# Patient Record
Sex: Female | Born: 1960 | Race: Black or African American | Hispanic: No | Marital: Single | Smoking: Former smoker
Health system: Southern US, Community
[De-identification: ages and names within clinical notes are randomized; demographics above are authoritative.]

## PROBLEM LIST (undated history)

## (undated) DIAGNOSIS — R51 Headache: Secondary | ICD-10-CM

## (undated) DIAGNOSIS — I350 Nonrheumatic aortic (valve) stenosis: Secondary | ICD-10-CM

## (undated) DIAGNOSIS — I5032 Chronic diastolic (congestive) heart failure: Secondary | ICD-10-CM

## (undated) DIAGNOSIS — N938 Other specified abnormal uterine and vaginal bleeding: Secondary | ICD-10-CM

## (undated) DIAGNOSIS — Z992 Dependence on renal dialysis: Secondary | ICD-10-CM

## (undated) DIAGNOSIS — IMO0001 Reserved for inherently not codable concepts without codable children: Secondary | ICD-10-CM

## (undated) DIAGNOSIS — M329 Systemic lupus erythematosus, unspecified: Secondary | ICD-10-CM

## (undated) DIAGNOSIS — Z8719 Personal history of other diseases of the digestive system: Secondary | ICD-10-CM

## (undated) DIAGNOSIS — I050 Rheumatic mitral stenosis: Secondary | ICD-10-CM

## (undated) DIAGNOSIS — I10 Essential (primary) hypertension: Secondary | ICD-10-CM

## (undated) DIAGNOSIS — Z5189 Encounter for other specified aftercare: Secondary | ICD-10-CM

## (undated) DIAGNOSIS — I639 Cerebral infarction, unspecified: Secondary | ICD-10-CM

## (undated) DIAGNOSIS — Z9289 Personal history of other medical treatment: Secondary | ICD-10-CM

## (undated) DIAGNOSIS — Z9889 Other specified postprocedural states: Secondary | ICD-10-CM

## (undated) DIAGNOSIS — IMO0002 Reserved for concepts with insufficient information to code with codable children: Secondary | ICD-10-CM

## (undated) DIAGNOSIS — N186 End stage renal disease: Secondary | ICD-10-CM

## (undated) DIAGNOSIS — Z954 Presence of other heart-valve replacement: Secondary | ICD-10-CM

## (undated) DIAGNOSIS — I38 Endocarditis, valve unspecified: Secondary | ICD-10-CM

## (undated) DIAGNOSIS — M1990 Unspecified osteoarthritis, unspecified site: Secondary | ICD-10-CM

## (undated) DIAGNOSIS — J189 Pneumonia, unspecified organism: Secondary | ICD-10-CM

## (undated) DIAGNOSIS — R112 Nausea with vomiting, unspecified: Secondary | ICD-10-CM

## (undated) DIAGNOSIS — R519 Headache, unspecified: Secondary | ICD-10-CM

## (undated) DIAGNOSIS — F40240 Claustrophobia: Secondary | ICD-10-CM

## (undated) DIAGNOSIS — K219 Gastro-esophageal reflux disease without esophagitis: Secondary | ICD-10-CM

## (undated) DIAGNOSIS — R011 Cardiac murmur, unspecified: Secondary | ICD-10-CM

## (undated) DIAGNOSIS — D649 Anemia, unspecified: Secondary | ICD-10-CM

## (undated) DIAGNOSIS — K279 Peptic ulcer, site unspecified, unspecified as acute or chronic, without hemorrhage or perforation: Secondary | ICD-10-CM

## (undated) HISTORY — DX: Rheumatic mitral stenosis: I05.0

## (undated) HISTORY — PX: ENDOMETRIAL ABLATION: SHX621

## (undated) HISTORY — DX: Chronic diastolic (congestive) heart failure: I50.32

## (undated) HISTORY — DX: Nonrheumatic aortic (valve) stenosis: I35.0

## (undated) HISTORY — PX: COLONOSCOPY W/ BIOPSIES AND POLYPECTOMY: SHX1376

## (undated) HISTORY — DX: Personal history of other medical treatment: Z92.89

## (undated) HISTORY — PX: TUBAL LIGATION: SHX77

## (undated) HISTORY — DX: Peptic ulcer, site unspecified, unspecified as acute or chronic, without hemorrhage or perforation: K27.9

## (undated) HISTORY — DX: Anemia, unspecified: D64.9

---
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
## 1998-02-04 ENCOUNTER — Emergency Department (HOSPITAL_COMMUNITY): Admission: EM | Admit: 1998-02-04 | Discharge: 1998-02-04 | Payer: Self-pay | Admitting: *Deleted

## 2001-12-09 ENCOUNTER — Other Ambulatory Visit: Admission: RE | Admit: 2001-12-09 | Discharge: 2001-12-09 | Payer: Self-pay | Admitting: Family Medicine

## 2002-01-14 ENCOUNTER — Encounter: Payer: Self-pay | Admitting: Obstetrics

## 2002-01-14 ENCOUNTER — Ambulatory Visit (HOSPITAL_COMMUNITY): Admission: RE | Admit: 2002-01-14 | Discharge: 2002-01-14 | Payer: Self-pay | Admitting: Obstetrics

## 2003-05-01 ENCOUNTER — Emergency Department (HOSPITAL_COMMUNITY): Admission: EM | Admit: 2003-05-01 | Discharge: 2003-05-01 | Payer: Self-pay | Admitting: Emergency Medicine

## 2003-05-02 ENCOUNTER — Emergency Department (HOSPITAL_COMMUNITY): Admission: EM | Admit: 2003-05-02 | Discharge: 2003-05-02 | Payer: Self-pay | Admitting: Emergency Medicine

## 2003-07-01 ENCOUNTER — Encounter: Admission: RE | Admit: 2003-07-01 | Discharge: 2003-07-01 | Payer: Self-pay | Admitting: Cardiology

## 2004-10-19 ENCOUNTER — Encounter: Admission: RE | Admit: 2004-10-19 | Discharge: 2004-10-19 | Payer: Self-pay | Admitting: Cardiology

## 2005-04-27 ENCOUNTER — Emergency Department (HOSPITAL_COMMUNITY): Admission: EM | Admit: 2005-04-27 | Discharge: 2005-04-27 | Payer: Self-pay | Admitting: Emergency Medicine

## 2005-04-30 ENCOUNTER — Inpatient Hospital Stay (HOSPITAL_COMMUNITY): Admission: EM | Admit: 2005-04-30 | Discharge: 2005-05-11 | Payer: Self-pay | Admitting: Emergency Medicine

## 2005-05-08 ENCOUNTER — Encounter: Payer: Self-pay | Admitting: Nephrology

## 2005-05-08 ENCOUNTER — Encounter (INDEPENDENT_AMBULATORY_CARE_PROVIDER_SITE_OTHER): Payer: Self-pay | Admitting: Specialist

## 2005-05-18 ENCOUNTER — Ambulatory Visit (HOSPITAL_COMMUNITY): Admission: RE | Admit: 2005-05-18 | Discharge: 2005-05-18 | Payer: Self-pay | Admitting: Nephrology

## 2005-05-21 HISTORY — PX: DIALYSIS FISTULA CREATION: SHX611

## 2005-05-31 ENCOUNTER — Emergency Department (HOSPITAL_COMMUNITY): Admission: EM | Admit: 2005-05-31 | Discharge: 2005-05-31 | Payer: Self-pay | Admitting: Emergency Medicine

## 2005-05-31 ENCOUNTER — Ambulatory Visit (HOSPITAL_COMMUNITY): Admission: RE | Admit: 2005-05-31 | Discharge: 2005-05-31 | Payer: Self-pay | Admitting: Nephrology

## 2005-06-01 ENCOUNTER — Emergency Department (HOSPITAL_COMMUNITY): Admission: EM | Admit: 2005-06-01 | Discharge: 2005-06-02 | Payer: Self-pay | Admitting: Emergency Medicine

## 2005-07-03 ENCOUNTER — Ambulatory Visit (HOSPITAL_COMMUNITY): Admission: RE | Admit: 2005-07-03 | Discharge: 2005-07-03 | Payer: Self-pay | Admitting: Vascular Surgery

## 2005-07-05 ENCOUNTER — Ambulatory Visit (HOSPITAL_COMMUNITY): Admission: RE | Admit: 2005-07-05 | Discharge: 2005-07-05 | Payer: Self-pay | Admitting: Nephrology

## 2005-08-19 ENCOUNTER — Inpatient Hospital Stay (HOSPITAL_COMMUNITY): Admission: EM | Admit: 2005-08-19 | Discharge: 2005-08-21 | Payer: Self-pay | Admitting: Emergency Medicine

## 2005-09-18 ENCOUNTER — Ambulatory Visit (HOSPITAL_COMMUNITY): Admission: RE | Admit: 2005-09-18 | Discharge: 2005-09-18 | Payer: Self-pay | Admitting: Nephrology

## 2005-11-15 ENCOUNTER — Encounter: Admission: RE | Admit: 2005-11-15 | Discharge: 2005-11-15 | Payer: Self-pay | Admitting: Nephrology

## 2005-11-21 ENCOUNTER — Emergency Department (HOSPITAL_COMMUNITY): Admission: EM | Admit: 2005-11-21 | Discharge: 2005-11-22 | Payer: Self-pay | Admitting: Emergency Medicine

## 2006-01-30 ENCOUNTER — Encounter: Admission: RE | Admit: 2006-01-30 | Discharge: 2006-01-30 | Payer: Self-pay | Admitting: Surgery

## 2006-01-31 ENCOUNTER — Encounter (INDEPENDENT_AMBULATORY_CARE_PROVIDER_SITE_OTHER): Payer: Self-pay | Admitting: Specialist

## 2006-01-31 ENCOUNTER — Ambulatory Visit (HOSPITAL_BASED_OUTPATIENT_CLINIC_OR_DEPARTMENT_OTHER): Admission: RE | Admit: 2006-01-31 | Discharge: 2006-01-31 | Payer: Self-pay | Admitting: Surgery

## 2006-04-30 ENCOUNTER — Inpatient Hospital Stay (HOSPITAL_COMMUNITY): Admission: EM | Admit: 2006-04-30 | Discharge: 2006-05-02 | Payer: Self-pay | Admitting: Emergency Medicine

## 2007-05-06 ENCOUNTER — Ambulatory Visit (HOSPITAL_COMMUNITY): Admission: RE | Admit: 2007-05-06 | Discharge: 2007-05-06 | Payer: Self-pay | Admitting: Nephrology

## 2007-06-12 ENCOUNTER — Ambulatory Visit (HOSPITAL_COMMUNITY): Admission: RE | Admit: 2007-06-12 | Discharge: 2007-06-12 | Payer: Self-pay | Admitting: Obstetrics

## 2008-01-20 ENCOUNTER — Ambulatory Visit: Payer: Self-pay | Admitting: Vascular Surgery

## 2008-03-23 ENCOUNTER — Encounter: Admission: RE | Admit: 2008-03-23 | Discharge: 2008-03-23 | Payer: Self-pay | Admitting: Obstetrics

## 2008-09-30 ENCOUNTER — Encounter: Admission: RE | Admit: 2008-09-30 | Discharge: 2008-09-30 | Payer: Self-pay | Admitting: Obstetrics

## 2009-03-15 ENCOUNTER — Ambulatory Visit (HOSPITAL_COMMUNITY): Admission: RE | Admit: 2009-03-15 | Discharge: 2009-03-15 | Payer: Self-pay | Admitting: Nephrology

## 2010-06-10 ENCOUNTER — Encounter: Payer: Self-pay | Admitting: Nephrology

## 2010-06-11 ENCOUNTER — Encounter: Payer: Self-pay | Admitting: Obstetrics

## 2010-06-12 ENCOUNTER — Encounter: Payer: Self-pay | Admitting: Obstetrics

## 2010-06-13 ENCOUNTER — Other Ambulatory Visit (HOSPITAL_COMMUNITY): Payer: Self-pay | Admitting: Nephrology

## 2010-06-13 DIAGNOSIS — N186 End stage renal disease: Secondary | ICD-10-CM

## 2010-07-04 ENCOUNTER — Other Ambulatory Visit (HOSPITAL_COMMUNITY): Payer: Self-pay

## 2010-10-03 NOTE — Assessment & Plan Note (Signed)
OFFICE VISIT   [name], [name]  DOB:  06-29-1960                                       01/20/2008  [id number]   This is an office visit.  The patient has a right upper arm  brachiocephalic AV fistula created by Dr. Amedeo Plenty 2 years ago.  The  fistula is functioning nicely but has enlarged over the last several  months and has several dilated areas.  She is not having pain and has  had no history of bleeding or ulceration from this.   PHYSICAL EXAMINATION:  On exam today blood pressure is 126/85, heart  rate 76, respirations 14.  She has a well-developed Kauffman fistula in  the right upper arm.  The largest area of dilatation is in the lower  third of the upper arm and this measures 4 cm in diameter with another  area up to 3 cm in diameter.  There are no ulcerated areas and no  evidence of any skin breakdown.  She has no steal symptoms distally.   I do not think the fistula is large enough at this point to merit  ligating and sacrificing it since we have no other option if we remove  it.  If we did this we would need to place a forearm graft with a Diatek  catheter but I would think she can get some more time out of this  fistula before it enlarges significantly.  If there are any episodes of  ulceration or bleeding then we should reconsider at that time.   Nelda Severe Kellie Simmering, M.D.  Electronically Signed   JDL/MEDQ  D:  01/20/2008  T:  01/21/2008  Job:  [id number]   cc:   [name] L. [name], M.D.

## 2010-10-06 NOTE — Discharge Summary (Signed)
NAMESHAYLEIGH, MERLINI                 ACCOUNT NO.:  [id number]   MEDICAL RECORD NO.:  [id number]          PATIENT TYPE:  INP   LOCATION:  5511                         FACILITY:  [facility]   PHYSICIAN:  Donato Heinz, M.D.DATE OF BIRTH:  07-28-1960   DATE OF ADMISSION:  04/30/2006  DATE OF DISCHARGE:  05/02/2006                               DISCHARGE SUMMARY   DISCHARGE DIAGNOSES:  1. Refractory nausea and vomiting.  2. Positive for Gastroccult.  3. Hypertension.  4. End-stage renal disease.  5. Secondary hyperparathyroidism.  6. Hyperlipidemia.  7. Systemic lupus erythematosus.  8. History of substance abuse.   PROCEDURES:  Hemodialysis.   HISTORY OF PRESENT ILLNESS:  Ms. Gruhlke is a 50 year old African American  female with end-stage renal disease secondary to lupus who dialyzes  Monday, Wednesday, Friday at the [facility]. She was seen  by the physician assistant and her dialysis treatments on the 10th and  given #6 samples of Prevacid 30 mg because she was having gastritis-type  symptoms and had been unable to fill her prescription for approximately  one day because the insurance would not pay for it.  The dialysis center  faxed an order to her pharmacy giving clearance for this and she should  be able to pick it up now.  Nonetheless, her vomiting was too  significant yesterday in order to keep the Prevacid down or her  hypertensive medicines.  She presented to the emergency room this  morning after getting her children off for school and was evaluated by  Dr. Monia Pouch at the emergency room and found to have coffee-ground  emesis.  Patient cannot identify any aggravating factor.  She said she  had only been off her PPIs for approximately one day.  She denied having  any abdominal pain or nausea with the vomiting, just uncontrolled  vomiting.  The patient did admit to drinking one beer this weekend but  said she did not even finish that beer.  She cannot identify  any other  dietary reason that might have initiated this acute onset of nausea and  vomiting.  She has been fairly stable and has a known history of  gastritis followed closely by Dr. Earlean Shawl.  Remainder of physical exam is  outlined in admission history and physical.   HOSPITAL COURSE:  Problem 1:  The patient was placed on clear liquids  and given IV Protonix and antiemetics.  She was still having nausea and  vomiting the following day and a GI consult was provided by Dr. Earlean Shawl.  He suspected that nausea and vomiting was most likely secondary to  advanced GERD and was most likely resolving or on appropriate therapy at  this time.  She will continue on omeprazole 40 mg b.i.d. as well as  Reglan to help these symptoms.  By the 13th, she was eating well and  tolerating her meals without problems.  Her blood pressure the day of  discharge was 126/78.  Her labetalol had been discontinued.  Her dry  weight is lowered to 70 kg.  Her hemoglobin is still to 15.6.  I  do not  think she needs serial hemoglobins.  She is on low-dose iron and no  Neupogen.  Her drug screen is still pending.  Her alcohol screen was  negative.   DISCHARGE MEDICATIONS:  1. Norvasc 10 mg at bedtime.  2. Simvastatin 40 mg per day.  3. Nephro-Vite 1 tablet per day.  4. PhosLo 667 mg three with meals three times a day.  5. Prilosec 40 mg b.i.d.  6. Reglan 10 mg before meals and bedtime.  7. Maxitrol eye drops as before.  8. She is advised to stop taking labetalol.   DISCHARGE DIET:  ADA, to limit fluids to 12 mL per day.  Her dialysis  orders are the same except her dry weight is now 70 kg.  Her Hectorol  dose is 4 mcg.  She is on tight heparin and InFeD is 50 mg IV per week.      Alric Seton, P.A.    ______________________________  Donato Heinz, M.D.    MB/MEDQ  D:  05/02/2006  T:  05/02/2006  Job:  [id number]   cc:   Donato Heinz, M.D.  Mayme Genta, M.D.  [facility]

## 2010-10-06 NOTE — H&P (Signed)
NAMETYRIONNA, AANENSON                 ACCOUNT NO.:  [id number]   MEDICAL RECORD NO.:  [id number]          PATIENT TYPE:  INP   LOCATION:  5511                         FACILITY:  [facility]   PHYSICIAN:  Donato Heinz, M.D.DATE OF BIRTH:  19-Jan-1961   DATE OF ADMISSION:  04/30/2006  DATE OF DISCHARGE:                              HISTORY & PHYSICAL   HISTORY OF PRESENT ILLNESS:  Ms. Reinheimer is a 50 year old African-American  female with end-stage renal disease secondary to lupus who dialyzes  Monday, Wednesday, Friday at the [facility].  She was seen  yesterday by the physician assistant at her dialysis treatment and was  given #6 samples of Prevacid 30 mg because she was having gastritis-type  symptoms and had been unable to fill her prescription for approximately  1 day because the insurance would not pay for it.  The dialysis center  faxed an order to her pharmacy giving clearance for this and she should  be able to pick it up now.  Nonetheless, her vomiting was too  significant yesterday in order to keep down the Prevacid and her  hypertensive medicines.  She presented to the emergency room this  morning after getting her children off for school and was evaluated by  Dr. Monia Pouch in the emergency room and found to have coffee-ground  emesis.  Patient cannot identify any aggravating factors.  She said she  had only been off her PPI for 1 day.  She is not having any abdominal  pain or nausea with the vomiting, just uncontrolled vomiting.  Patient  did admit to drinking 1 beer this weekend but said that she did not even  finish that beer.  She cannot identify any other dietary reasons that  would have initiated this acute onset of nausea and vomiting.  She had  been fairly stable and has a known history of gastritis followed by Dr.  Earlean Shawl.   PAST MEDICAL HISTORY:  1. Hypertension.  2. Anemia.  3. Dyslipidemia.  4. Peptic ulcer disease.  5. Hiatal hernia.  6. [facility]  admission, April, 2007, for subarachnoid bleed, fell at      home, had positive cocaine and marijuana drug screen at that time,      severe hypertension due to not taking blood pressure medications.   PRIMARY CARE PHYSICIAN:  Dr. Montez Morita.   ALLERGIES:  She has NO KNOWN DRUG ALLERGIES.   CURRENT MEDICATIONS:  1. Omeprazole 40 mg b.i.d. x7 days and then q.h.s. was started      December 10.  2. Phos-Lo 667 mg, 3 with meals 3x a day.  3. Labetalol 300 mg b.i.d.  4. Maxitrol eye drops, 2 drops daily.  5. Phenergan as ordered.  6. Renovite 1 daily.  7. Simvastatin 40 mg per day.  8. Norvasc 10 mg at bedtime.   SOCIAL HISTORY:  According to ER note, she told the ER physician that  she had no drug abuse history, although this is known by old records.  Currently smokes cigarettes, less than 1 pack per day, and drinks  alcohol occasionally.  Patient has  2 children who are elementary and  middle-school age.  She lives with them.   FAMILY HISTORY:  Mother alive at age 60 with hypertension.  Father died,  had a history of hypertension and was on dialysis in [place].   REVIEW OF SYSTEMS:  No fever, no chills, no sweats, no weight gain.  Appetite is good.  No fatigue, no headaches, no dizziness, no visual  changes.  No new skin lesions.  No shortness of breath, chest pain, or  dyspnea on exertion.  No cough, no edema.  No heat or cold intolerance.  Patient is currently having her period.  Has no frequency or urgency  with urination.  She has not seen any blood in her stool.  She has no  diarrhea or constipation or change in bowel habits.   PHYSICAL EXAM:  VITAL SIGNS:  Temperature 97.1, pulse 115, blood  pressure 165/117, respiratory rate 18, pulse ox 99% on room air.  GENERAL:  Alert and oriented, in no acute distress.  NECK:  No JVD, no lymphadenopathy.  HEART:  Tachycardiac at a rate of 115.  LUNGS:  Clear to auscultation bilaterally.  ABDOMEN:  Positive bowel sounds, soft,  nontender, nondistended.  EXTREMITIES:  No cyanosis, no clubbing, no edema.  Access:  Right upper  AV fistula with positive bruit.  GENITOURINARY/RECTAL:  Not examined.  NEURO:  Alert and oriented x3.  Cranial nerves II through XII intact.  Motor intact in all extremities.   ASSESSMENT AND PLAN:  1. Refractory nausea and vomiting with coffee-ground emesis and a      known history of peptic ulcer disease and gastritis.  Patient has      been off of her proton pump inhibitors for several days.  She was      given intravenous Zofran and intravenous proton pump inhibitor in      the emergency room and presently has stopped vomiting.  Her emesis      has been sent for Gastroccult.  She is admitted for supportive      treatment and advancement of diet as tolerated.  I believe this      will be short-lived and she will be able to be discharged tomorrow      after dialysis.  2. Uncontrolled hypertension secondary to nausea and vomiting and      unable to keep down her medications.  She has received some      intravenous labetalol in the emergency room and her blood pressure      is improving.  We will continue intravenous labetalol as needed and      resume oral medicines as able.  She has had well-controlled blood      pressure in the outpatient setting.  3. Coffee-ground emesis.  She is on a proton pump inhibitor.  We will      follow hemoglobin.  Her hemoglobin is actually 17 now, which is      probably elevated due to volume contraction.  4. Systemic lupus erythematosus.  This is stable.  5. End-stage renal disease.  Continue dialysis schedule Monday,      Wednesday, Friday.  Hopefully she will be able to discharge after      dialysis tomorrow.      Alric Seton, P.A.    ______________________________  Donato Heinz, M.D.    MB/MEDQ  D:  04/30/2006  T:  05/01/2006  Job:  [id number]

## 2010-10-06 NOTE — Op Note (Signed)
[name], [name]                 ACCOUNT NO.:  [id number]   MEDICAL RECORD NO.:  [id number]          PATIENT TYPE:  AMB   LOCATION:  SDS                          FACILITY:  [facility]   PHYSICIAN:  Dorothea Glassman, M.D.    DATE OF BIRTH:  03/17/1961   DATE OF PROCEDURE:  07/03/2005  DATE OF DISCHARGE:  07/03/2005                                 OPERATIVE REPORT   SURGEON:  Dorothea Glassman, M.D.   ASSISTANT:  Suzzanne Cloud, P.A.   ANESTHETIC:  Local with MAC.   PREOPERATIVE DIAGNOSIS:  End-stage renal failure.   POSTOPERATIVE DIAGNOSIS:  End-stage renal failure.   PROCEDURE:  Right brachial cephalic arteriovenous fistula.   OPERATIVE PROCEDURE:  The patient was brought to the operating room in  stable condition; pin supine position. Right arm prepped and draped in a  sterile fashion. Skin and subcutaneous tissues were instilled with 1%  Xylocaine with epinephrine. Transverse skin incision made through the right  antecubital fossa. Dissection carried through the subcutaneous tissue. The  right cephalic vein identified. This was mobilized proximally and distally.  Tributaries were ligated with 4-0 silk and divided. The vein ligated  distally with 2-0 silk and divided. Dilated with heparin saline solution.  The vein was 4-5 mm in size.   The brachial artery was exposed through the fascia. Dissection carried down  to free the brachial artery which was encircled proximally and distally with  vessel loops. The brachial artery controlled proximally and distally with  bulldog clamps. A longitudinal arteriotomy made. The vein beveled and  anastomosed end-to-side to the brachial artery using running 7-0 Prolene  suture. Clamps were removed. Excellent flow present. Adequate hemostasis  obtained.  Sponge and instruments counts correct.   Subcutaneous tissue closed with a running 3-0 Vicryl suture. Skin closed  with a 4-0 Monocryl. Steri-Strips applied. No apparent complications. The  patient  transferred to recovery room in stable condition.      Dorothea Glassman, M.D.  Electronically Signed     PGH/MEDQ  D:  07/03/2005  T:  07/03/2005  Job:  [id number]

## 2010-10-06 NOTE — Discharge Summary (Signed)
[name], [name]                 ACCOUNT NO.:  [id number]   MEDICAL RECORD NO.:  [id number]          PATIENT TYPE:  INP   LOCATION:  5501                         FACILITY:  [facility]   PHYSICIAN:  Leamon Arnt(?), M.D.DATE OF BIRTH:  08/03/1960   DATE OF ADMISSION:  08/19/2005  DATE OF DISCHARGE:  08/21/2005                                 DISCHARGE SUMMARY   DISCHARGE DIAGNOSES:  1.  Fall.  2.  Traumatic brain injury with questionable subarachnoid hemorrhage.  3.  End-stage renal disease on dialysis.  4.  Hypertension.  5.  Hyperlipidemia.  6.  Peptic ulcer disease.  7.  Inguinal hernia.   CONSULTANTS:  Renal.   PROCEDURES:  None.   HISTORY OF PRESENT ILLNESS:  This is a 50 year old black female who fell in  her bathroom at home.  She is amnestic to the event.  She comes in as a non-  trauma code.  She was slightly confused on arrival.  Workup demonstrated a  questionable left subarachnoid near the tentorial membrane.  There was quite  a bit of motion artifact.  She is admitted for observation and renal  consultation, as she is in end-stage renal disease.   HOSPITAL COURSE:  The patient did well in the hospital.  Her medical issues  were managed by the renal service.  Repeat head CT was unchanged, and she  did not have any apparent clinical sequelae from her fall, with the  exception of the amnesia of the event itself.  She was felt to be able to go  home in good condition on hospital day #3.   DISCHARGE MEDICATIONS:  She is take over-the-counter pain medications, and  she is to resume all her home medications.  Other adjustments made by the  renal service prior to discharge.   FOLLOW UP:  The patient is to follow-up with the renal physicians as usual.  She will call the trauma service with any other questions.      Hilbert Odor, P.A.    ______________________________  Leamon Arnt(?), M.D.    MJ/MEDQ  D:  08/21/2005  T:  08/21/2005  Job:  [id number]

## 2010-10-06 NOTE — Consult Note (Signed)
NAMELOCHLAN, TORNETTA                 ACCOUNT NO.:  [id number]   MEDICAL RECORD NO.:  [id number]          PATIENT TYPE:  INP   LOCATION:  [id number]                         FACILITY:  [facility]   PHYSICIAN:  Mayme Genta, M.D.DATE OF BIRTH:  08/31/1960   DATE OF CONSULTATION:  05/01/2006  DATE OF DISCHARGE:                                 CONSULTATION   CHIEF COMPLAINT:  Nausea and vomiting.   HISTORY OF PRESENT ILLNESS:  The patient is a 50 year old African-  American female admitted with nausea and vomiting.  She was last seen by  me as an outpatient August 2007 for similar symptoms.  Evaluation with  endoscopy revealed erosive esophagitis.  This occurred in the face of  generic omeprazole therapy, and she was prescribed high-dose omeprazole  40 mg b.i.d. with which symptoms resolved.   The patient was admitted to the hospital yesterday after she presented  to the emergency room with recurrent symptoms of refractory nausea and  vomiting.  Her omeprazole had ran out four days previously.  She did  note worsening pyrosis and regurgitation prior to the onset of her  symptoms.  She has markedly improved within 24 hours of admission.  Additional history of coffee-ground hematemesis and Hemoccult-positive  gastric aspirate.   The patient presently is asymptomatic.  Started on PPI therapy since  admission with Protonix 40 mg daily and Reglan 10 mg a.c. and q.h.s.   The patient denies odynophagia and dysphagia.  No hematemesis of frank  blood, no abdominal pain, denies dyspepsia.  Bowel habits regular.  No  hematochezia or melena.  The patient remained cardiovascularly stable  since admission and appetite has returned to her excellent baseline  status.   PAST MEDICAL HISTORY:  1. Hypertension.  2. Anemia.  3. Dyslipidemia.  4. S/P subarachnoid bleed April 2007.   ALLERGIES:  No known drug allergies.   CURRENT MEDICAL REGIMEN:  1. Omeprazole 40 mg b.i.d.  2. PhosLo 667 mg 3 with meals   t.i.d.  3. Labetalol 300 mg b.i.d.  4. Maxitrol eye drops daily.  5. Renavit 1 daily.  6. Simvastatin 40 mg daily.  7. Norvasc 10 mg daily.   SOCIAL HISTORY:  Three times a week hemodialysis.  Actively smoking  albeit less one pack per day.  Occasional alcohol including recent beer,  denies abuse or binge drinking.  Two children living at home.  The  patient denies drug abuse though admission earlier this year was  positive for cocaine and marijuana.   FAMILY HISTORY:  Mother with hypertension.  Father decreased from  hypertension with end-stage renal disease.   REVIEW OF SYSTEMS:  Noncontributory - per HPI.   PHYSICAL EXAMINATION:  A healthy, alert and oriented middle-aged [place]-  American female alert and oriented, in no acute distress.  VITAL SIGNS:  Stable.  HEENT:  Anicteric sclerae.  Pink conjunctivae.  Moist mucous membranes.  No oropharyngeal lesions.  No exudate.  NECK:  Supple, no adenopathy, thyromegaly, or bruit.  CHEST:  Clear to auscultation.  CARDIAC:  Regular rhythm, no gallops, no murmur.  ABDOMEN:  Now soft,  nondistended, nontender.  No fluid wave, no  organomegaly, firmness, or mass.  Bowel sounds were active throughout  but not performed.  EXTREMITIES:  Right antecubital AV fistula with a positive thrill.  RECTAL:  No performed.  NEUROLOGIC:  There is no focal deficit.   LABORATORY DATA:  Significant abnormalities include initial  hemoconcentration but with normal hemoglobin at 15.5, hematocrit 45.5  with 24 hours of rehydration, WBC 3.4K, platelets 203K.  Slight elevated  BUN 35, glucose 111, AST 38 (normal 37), normal lipase of 35.  Undetectable alcohol level, and positive gastric fluid.   ASSESSMENT:  1. Nausea, vomiting - precipitated by gastric esophageal acid reflux      disease exacerbated by PPI therapy temporarily unavailable.  Over      the ensuing four days with probable worsening reflux resulting in      hospitalization.  No evidence of  significant gastrointestinal blood      loss.  Hematemesis of coffee-ground material minimal without      evidence of hemodynamic compromise.  The patient is rapidly      improving.  There is no need for further evaluation at this time.      I would simply encourage the patient to adhere to chronic acid      suppression with maintenance high-dose generic omeprazole 40 mg      t.i.d.  If there is a way to get branded PPI therapy for this      patient, I think she will do better in the long run given increased      efficacy in her case.  No further evaluation presently recommended.   RECOMMENDATIONS:  1. Continue PPI therapy as outlined above.  2. No need for endoscopic evaluation presently.  3. Antireflux measures reviewed.      Mayme Genta, M.D.  Electronically Signed     JRM/MEDQ  D:  05/01/2006  T:  05/02/2006  Job:  [id number]

## 2010-10-06 NOTE — Op Note (Signed)
[name], [name]                 ACCOUNT NO.:  [id number]   MEDICAL RECORD NO.:  [id number]          PATIENT TYPE:  AMB   LOCATION:  NESC                         FACILITY:  [facility]   PHYSICIAN:  Thomas A. Cornett, M.D.DATE OF BIRTH:  [birth year]   DATE OF PROCEDURE:  01/31/2006  DATE OF DISCHARGE:                                 OPERATIVE REPORT   PREOPERATIVE DIAGNOSES:  3 x 3 cm epidermal inclusion cyst, right shoulder.   POSTOPERATIVE DIAGNOSES:  3 x 3 cm epidermal inclusion cyst, right shoulder.   PROCEDURE:  Excision of 3 cm x 3 cm epidermal inclusion cyst, right  shoulder.   SURGEON:  Marcello Moores A. Cornett, M.D.   ANESTHESIA:  MAC with 20 mL of 0.25% Sensorcaine with epinephrine.   ESTIMATED BLOOD LOSS:  10 mL.   SPECIMENS:  Right shoulder cyst to pathology.   INDICATIONS FOR PROCEDURE:  The patient is a 50 year old female with an  epidermal inclusion cyst. It has been giving her problems with pain and  discomfort. She wished to have it removed and presents today for that.   DESCRIPTION OF PROCEDURE:  The patient was brought to the operating room and  placed prone. MAC anesthesia was initiated and the right shoulder region was  prepped and draped in a sterile fashion. 0.25% Sensorcaine was used and 20  mL of this was used for analgesia. The cyst was excised down into the  subcutaneous fat and passed off the field. The wound was examined and found  to be hemostatic. It was closed with a combination of 2-0 Vicryl and 3-0  Monocryl. Steri-Strips and dry dressing were applied. All final counts of  sponge, needle and instruments were found to be correct at this portion of  the case. The patient was awoke and taken to recovery in satisfactory  condition.      Thomas A. Cornett, M.D.  Electronically Signed     TAC/MEDQ  D:  01/31/2006  T:  02/01/2006  Job:  [id number]   cc:   Dr. Jimmy Footman

## 2010-10-06 NOTE — Consult Note (Signed)
NAMEMARLAYA, [name]                 ACCOUNT NO.:  [id number]   MEDICAL RECORD NO.:  [id number]          PATIENT TYPE:  INP   LOCATION:  0101                         FACILITY:  [facility]   PHYSICIAN:  Caren Griffins B. Lorrene Reid, M.D.DATE OF BIRTH:  07/05/1960   DATE OF CONSULTATION:  04/30/2005  DATE OF DISCHARGE:                                   CONSULTATION   CONSULTING PHYSICIAN:  Dr. Montez Morita.   REASON FOR CONSULTATION:  Renal failure and hypertensive emergency.   We are asked to see this 50 year old black female who has a history of  hypertension of at least 12 years duration which has not been well  controlled by her report despite multiple medication trials. She now  presents with her second emergency department visit in a three-day period  with severe hypertension and labs today showed a creatinine of 7.3 which was  1.3 in June 2006. As a result, we were asked to consult on this lady.   She has been followed by Dr. Montez Morita for problems of hypertension,  hyperlipidemia, peptic ulcer disease and hiatal hernia. Blood pressures over  the past six months by both the patient and Dr. Tonita Phoenix recollections have  been in the 160 to 190 range. She has been on variations of medicines that  have included Lotrel, Hyzaar, Toprol samples, Sular which she could not take  due to headache and Lasix for edema that has occurred over the past several  months. She presented to the emergency room on April 27, 2005 with nausea,  vomiting and a blood pressure of 164/98 and 169/95. She received clonidine  and IV labetalol. No laboratory studies were done.   She returned today with similar complaints of nausea, vomiting, now with  diarrhea and blood pressure initially was 191/139. Laboratory studies were  done on this presentation and serum creatinine was 7.3 with a BUN of 51 and  a potassium of 3.8.   Her past renal history is fairly limited. Serum creatinine in June of 2006  was 1.3. Urinalysis was bland  but did show greater than 300 milligrams  percent protein by dipstick. She had an abdominal ultrasound done for other  reasons (GI complaints) in June 2006 and kidneys at that time measured 12.5  and 12.6 cm respectively without hydronephrosis or increased echogenicity.   Urinary symptoms wise her output has been down for the past few days and she  describes her urine as murky but has not seen any blood. She has been trying  to take her Lotrel and Hyzaar over the past few days but has not taken her  Lasix. She uses nonsteroidal's on an infrequent basis.   PAST MEDICAL HISTORY:  1.  Hypertension of greater than 12 years duration with a very positive      family history.  2.  Chronic kidney disease with proteinuria by dip stick June 2006 with a      creatinine of 1.3 at that time.  3.  Severe hyperlipidemia with a total cholesterol of 372 and LDL      cholesterol of 269 in June 2006.  4.  Peptic ulcer disease.  5.  Hiatal hernia (GI issues have been addressed by Dr. Earlean Shawl).   She has no known drug allergies.   CURRENT MEDICATIONS:  On presentation were Lipitor 20 milligrams a day,  Lotrel 10/20 once a day, Hyzaar 100/25 once a day, Protonix 40 milligrams a  day and Lasix 40 milligrams a day.   FAMILY HISTORY:  Positive for hypertension in her parents and grandparents  and also in a 60 year old daughter who has been worked up at [facility] and is on  medications. She has a father who had end-stage renal disease and was on  hemodialysis at a dialysis unit in [place] prior to his death. She is not  sure why he had end-stage renal disease but she is aware that he had  hypertension. His name was [name].   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 She is unmarried but has two daughters, one  age 46 who has hypertension followed at [facility] and the other age 21  who is healthy. She states she smokes 5-10 cigarettes a  day. Drinks  occasional alcohol and has done crack cocaine in the past. She has been  tested for HIV in the past and has been negative and reports no history of  hepatitis.   REVIEW OF SYSTEMS:  Positive for several months of nausea and vomiting.  Anorexia for several weeks. Diarrhea for 4-5 days. Swelling in her lower  extremities for over 6 months. Swollen glands in her face and under her chin  for months. Weight loss over the past several days and murky-looking urine.  She denies chest pain, shortness of breath, headache, blurred vision, double  vision rash, myalgias or arthralgias.   On physical exam, she is a somewhat ill-appearing black female. She has a  Foley catheter in place and there is no urine in the Foley. Blood pressure  193/139, heart rate 100. Funduscopic exam was somewhat difficult due to  myotic pupils but there was arteriolar narrowing present. I could not see  the disk margins. Cardiac exam revealed a prominent S4. Normal S1-S2.  Abdomen was somewhat distended. Bowel sounds were normally active and there  were no bruits, but she had mild diffuse tenderness. Femoral pulses were 2+  and symmetric. There was trace edema of the right lower extremity. No edema  of the left lower extremity. Distal pulses were 2+ and symmetric. Skin was  free of rashes and joints free of effusion.   LABORATORY:  Included hemoglobin 12.12, platelets 320,000. Sodium 134,  potassium 3.8, chloride 105, CO2 20, BUN 51, creatinine 7.3, calcium 7.8.   IMPRESSION:  A 50 year old black female who presents with accelerated  hypertension and renal failure. Creatinine was 1.3 in June 2006 with greater  than 300 milligrams percent protein by dipstick; creatinine is now 7.3. She  is probably a little on the dry side due to nausea, vomiting and diarrhea.   In terms of putting this picture together, it is unclear to me whether this is renal failure all secondary to hypertension plus or minus effects  of  volume depletion and ARB therapy or whether she has some underlying renal  disease perhaps primary FSGS associated with severe hypertension; the  combination of which have resulted in renal failure. Her nausea and vomiting  could certainly be centrally related due to blood pressure or may be  secondary to uremia. Her hemoglobin is not as low as I  might expect given  her degree of renal failure but I do believe she is dry and may be  hemoconcentrated.   RECOMMENDATIONS:  1.  Control hypertension with a goal diastolic for right now of 95-100.  2.  Dr. Montez Morita has already started IV nitroglycerin. I would add a      labetalol drip and also supplement with oral clonidine.  3.  Would avoid ACE or ARB therapy for the time being.  4.  We will check a urinalysis, urine protein, creatinine and serologies to      include ANA, SPEP, UPEP, anti-SSA and SSB (given her parotid swelling),      ACE level, C3-C4 hepatitis B, hepatitis C, HIV and CPK.  5.  Would obtain a renal ultrasound to see if there has been any change in      kidney size or echogenicity over the past 6-8 months.  6.  Would transfer to [facility]. She may well require urgent hemodialysis      and/or may require renal biopsy. If no diagnosis is forthcoming and      kidneys have remained normal in size in 9/9 echogenic, she might be a      candidate for biopsy.  7.  She should probably at some point have a workup for renal artery      stenosis since she does have a fairly impressive hyperlipidemia and also      has a family history with a young child who also has hypertension      (bringing to mind the possibility of fibromuscular dysplasia).  8.  Would continue with slow IV fluids. Although her pressure is quite high      supine, I cannot visualize her neck veins and I think she could well be      a little bit volume depleted despite the severity of her hypertension      secondary to her nausea, vomiting, diarrhea.  9.  We will  follow closely with you. Thanks for asking [place] to see this young      woman. We will follow up when she is transferred to [facility].           ______________________________  Elzie Rings. Lorrene Reid, M.D.     CBD/MEDQ  D:  04/30/2005  T:  04/30/2005  Job:  [id number]

## 2010-10-06 NOTE — Consult Note (Signed)
[name], [name]                 ACCOUNT NO.:  [id number]   MEDICAL RECORD NO.:  [id number]          PATIENT TYPE:  INP   LOCATION:  5501                         FACILITY:  [facility]   PHYSICIAN:  Caren Griffins B. Lorrene Reid, M.D.DATE OF BIRTH:  [birth year]   DATE OF CONSULTATION:  DATE OF DISCHARGE:  08/21/2005                                   CONSULTATION   REFERRED BY:  Imogene Burn. Tsuei, M.D.   This is a 50 year old black woman with a past medical history significant  for end-stage renal disease on hemodialysis twice a week. 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 She is dialyzed  at [place] who comes to the emergency room after  she fell in her bathroom, lost consciousness and she was transferred through  EMS. In the ER, the trauma surgeon found subarachnoid hemorrhage/hemorrhagic  contusion of the left posteromedial temporal lobe. She was admitted by the  trauma service and was evaluated by a neurosurgeon already and we are asked  to take her for hemodialysis.   PAST MEDICAL HISTORY:  1.  Hypertension of more than 12 years duration. We have a very positive      family history.  2.  Severe hyperlipidemia.  3.  History of peptic ulcer disease.  4.  Hiatal hernia.   ALLERGIES:  She has no known drug allergies.   CURRENT MEDICATIONS:  1.  Phenergan suppositories 25 mg q.6 p.r.n.  2.  Clonidine 0.3 mg p.o. t.i.d.  3.  Labetalol 300 mg 1 t.i.d. on nondialysis days and 1 tablet b.i.d. on      dialysis days.  4.  Protonix 40 mg daily.  5.  Septra DS 1 p.o. daily x7 days.  6.  Prednisone 20 mg three tablets daily.  7.  Simvastatin 40 mg p.o. daily.  8.  Norvasc 10 mg p.o. daily.  9.  Lasix 80 mg p.o. daily.   Of note, the patient said that she ran out between 2-3 days ago on  Clonidine, Norvasc and Lasix.   FAMILY HISTORY:  Positive for hypertension in her parents and grandparents  and also in a 22 year old daughter who  has been worked up at [facility] and is on  medication. Her father had end-stage renal disease and was on hemodialysis  at dialysis unit in [place] prior to his death. She does not know the  cause of the renal disease.   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 She is married and has 2 daughters, one at the  age of 44 and the other one at the age of 80 who is healthy. She smokes  approximately 10 cigarettes a day, drinks occasional alcohol.   REVIEW OF SYSTEMS:  Positive for nausea and vomiting for the last 2-3 days  after dialysis on Friday.   PHYSICAL EXAMINATION:  GENERAL:  The patient is lying down in no acute  distress, very cooperative.  VITAL SIGNS:  Her blood pressure by EMS was 194/137, at the emergency room  was  190/130, temperature 98, pulse 99, respiratory rate 18 and oxygen  saturation 97% on room air.  HEENT:  PERRLA.  Mucosa is moist with no intraoral lesions.  NECK:  Supple, no JVD.  CHEST:  She has a right subclavicular catheter of dialysis with a dressing.  HEART:  Regular rate and rhythm with no murmurs.  LUNGS:  Clear to auscultation bilaterally.  ABDOMEN:  Soft, nontender, bowel sounds are positive.  EXTREMITIES:  No edema with pedal pulses 2+. On the right arm, she has a  fistula with positive bruit that has not been used yet.  NEUROLOGIC:  No sign of focality but she is oriented x1 to place.   ASSESSMENT/PLAN:  1.  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 On hemodialysis biweekly      according to the patient. The plan is to get records from the Dialysis      Center as soon as possible as todaywe were not able to do andwe are      going to do it tomorrow. She is going to be dialyzed tomorrow, Monday      and with a duration of 4 hours, blood flow of 400  mL an hour with      dialysis of 800 mL an hour. A weight to 67 kilos and withtwo potassium      __________  and no heparin. No heparin at the initiation of dialysis,      completely withdraw all heparin and do not overfill with heparin at the      end. Also because the patient was put in n.p.o. by trauma surgeon and      neurosurgeon, we will switch her p.o. medications to IV so she will be      treated with labetalol 20 mg IV now and then q.3 h and also Catapres      patch 3 q. week and Solu-Medrol 24 mg IV daily. As soon as the patient      can takep.o. she is going to be restarted on her regular medications and      also according to the records that we will obtain. Also we will check a      urinalysis today and a renal panel now and tomorrow in the morning a CBC      and renal panel.  2.  Hypertension. She will be put on Catapres patch.  3.  Q. week labetalol IV.  4.  Left post medial subarachnoid hemorrhage. This will be followed by      primary team.  5.  History of peptic ulcer disease and hiatal hernia. We will treat her      with Protonix IV 40 mg daily and also we will give her Phenergan 12.5 mg      IV q.6 p.r.n. Also we will decrease her normal setting to 50 mL an hour.      The patient was seen and discussed with Dr. Lorrene Reid.      Yong Channel, MD    ______________________________  Elzie Rings. Lorrene Reid, M.D.    Darlin Coco  D:  08/19/2005  T:  08/21/2005  Job:  [id number]

## 2010-10-06 NOTE — Discharge Summary (Signed)
NAMEDEJONAE, STUEDEMANN                 ACCOUNT NO.:  [id number]   MEDICAL RECORD NO.:  [id number]          PATIENT TYPE:  INP   LOCATION:  2014                         FACILITY:  [place]   PHYSICIAN:  Ardyth Gal. Spruill, M.D.DATE OF BIRTH:  February 21, 1961   DATE OF ADMISSION:  04/30/2005  DATE OF DISCHARGE:  05/11/2005                                 DISCHARGE SUMMARY   DISCHARGE DIAGNOSES:  1.  Systemic lupus erythematosus.  2.  Acute renal failure.  3.  Volume depletion.  4.  Hypertensive renal disease.  5.  Hypo-osmolality.  6.  Mallory-Weiss syndrome.   CONSULTATIONS:  1.  Mayme Genta, M.D., small-bowel endoscopy.  2.  Doristine Church. Dover, M.D., needle biopsy of the kidney, May 08, 2005.  [place] Florene Glen, M.D., renal.   Ms. Sallee is a 50 year old patient who was seen initially in the emergency  department of the [facility], at which time she was  found to have acute renal failure with creatinine of 7.3.  She had vomiting,  she was noted to have anemia, which had gone from a hemoglobin of 12 to a  hemoglobin of 7 g.  She was subsequently transferred to the [facility] Intensive Care Unit for aggressive care treatment of this  particular problem.   HOSPITAL COURSE:  The patient was admitted initially in the intensive care  unit.  She was seen on renal consultation where it was noted that she had  accelerated hypertension, renal failure with creatinine of 7.3 as noted  above.  Plans were made to evaluate if she had renal failure because of her  hypertension or renal failure because of underlying renal disease.  Labetalol drip was added to the nitroglycerin that was started as well as  p.o. clonidine.   The patient was noted to have some GI bleeding with heme-positive stools.  The patient was typed and cross-matched for packed cells.  Plans were then  made for GI consultation.  Patient was noted to have GERD and grade B  ulcerative  esophagitis.  Patient was placed on Protonix and antiemetics.   On May 02, 2005, the patient's blood pressure was down to 160/90.  It  is of note that HIV testing and hepatitis B and C were all negative.   On May 03, 2005, there was no vomiting present.  Blood pressure was  still fluctuating.  Plans were made for biopsy of the kidney.  The labetalol  was gradually changed to oral.  The creatinine continued to improve.   On May 08, 2005, the patient underwent left renal biopsy with  ultrasound guidance.   After further evaluation it was the opinion that the patient probably  suffered from systemic lupus erythematosus nephritis with advanced renal  failure.  Plans were made for discharge of this patient as the blood  pressure was under better control, the anemia was under better control.   DISCHARGE MEDICATIONS:  1.  Citric acid twice a day.  2.  Prednisone 60 mg daily.  3.  Labetalol 200 mg every eight  hours.  4.  Catapres 0.3 t.i.d.  5.  Norvasc 10 mg each day.  6.  Protonix 40 mg daily.  7.  Lasix 80 mg twice a day.  8.  Carafate 1 g three times a day.  9.  Calcium 500 mg three times a day.  10. Zocor 40 mg daily.   Patient is to be seen in the renal office for follow-up in one week and in  Dr. Tonita Phoenix office in two weeks.      Lily Kocher, P.A.      Ardyth Gal. Spruill, M.D.  Electronically Signed    HB/MEDQ  D:  07/31/2005  T:  08/01/2005  Job:  [id number]

## 2010-10-06 NOTE — Consult Note (Signed)
[name], [name]                 ACCOUNT NO.:  [id number]   MEDICAL RECORD NO.:  [id number]          PATIENT TYPE:  INP   LOCATION:  5501                         FACILITY:  [place]   PHYSICIAN:  Elizabeth Sauer, M.D.      DATE OF BIRTH:  03/20/1961   DATE OF CONSULTATION:  08/19/2005  DATE OF DISCHARGE:  08/21/2005                                   CONSULTATION   PLACE OF CONSULTATION:  Emergency room.   PHYSICIAN ASKING FOR CONSULT:  Nat Christen, M.D.   I was asked to see this 50 year old, right-handed black female who  apparently fell in the bathroom and had loss of consciousness.  Brought by  her family and EMS to the hospital.  She is a little bit sleepy in the  emergency room.  By history, she is a dialysis patient and to be dialyzed  tomorrow.  CT of the head was obtained and showed a small amount of  subarachnoid blood seen on the tentorium.  She was also visited by trauma  service.   Particulars of H&P are left to the trauma service.   PHYSICAL EXAMINATION:  NEUROLOGIC: She is awake, alert, oriented x3.  Can  recall 3/3 objects at 5 minutes.  Pupils equal, round, and reactive to  light.  Extraocular eye movements are intact.  Facial movement and sensation  are intact.  Tongue protrudes in the midline.  Shoulder shrug is normal, and  she describes some swallowing difficulties.  Motor exam is 5/5 with no  pronator drift. Sensation is intact.   Deep tendon reflexes are absent at the knees and ankles, 1 in the upper  extremities.  Hoffmann's is negative.   CT scan has been reviewed above.   CLINICAL IMPRESSION:  Closed head injury, Glasgow Coma Scale 15.   PLAN:  Observation.  I talked to the renal doctors about holding her heparin  in her dialysis.   Thank you very much.           ______________________________  Elizabeth Sauer, M.D.     MWR/MEDQ  D:  08/19/2005  T:  08/21/2005  Job:  [id number]

## 2010-10-06 NOTE — H&P (Signed)
NAMEBERNETHA, [name]                 ACCOUNT NO.:  [id number]   MEDICAL RECORD NO.:  [id number]          PATIENT TYPE:  INP   LOCATION:  5501                         FACILITY:  [place]   PHYSICIAN:  Imogene Burn. Georgette Dover, M.D. DATE OF BIRTH:  12/06/1960   DATE OF ADMISSION:  08/19/2005  DATE OF DISCHARGE:                                HISTORY & PHYSICAL   CHIEF COMPLAINT:  Fall with loss of consciousness.   HISTORY OF PRESENT ILLNESS:  The patient is a 50 year old renal patient who  had an unwitnessed fall in the bathroom at home.  Her family members heard  her fall and when they arrived at the bathroom, she was unconscious.  The  patient does not remember what happened.  On arrival to the emergency  department, she was mildly confused.   PAST MEDICAL HISTORY:  1.  Hypertension.  2.  Chronic renal failure with Monday and Friday hemodialysis.  Dr. Florene Glen      is her nephrologist.  3.  Hyperlipidemia.  4.  Peptic ulcer disease.  5.  Hiatal hernia.   PAST SURGICAL HISTORY:  1.  Right forearm AV fistula.  2.  Right subclavian dialysis catheter placement.   SOCIAL HISTORY:  Smokes half a pack a day. Drinks occasionally.  Admits to  crack cocaine in the past.   ALLERGIES:  None.   MEDICATIONS:  Labetalol, Septra, Norvasc, clonidine, prednisone, Zocor,  Protonix, calcium.   PRIMARY CARE PHYSICIAN:  Ardyth Gal. Spruill, M.D.   PHYSICAL EXAMINATION:  VITAL SIGNS:  Temperature 98.  Pulse 101, blood  pressure 189/124, oxygen saturation 97%.  SKIN:  Warm and dry.  HEENT:  No hematoma.  No lacerations.  No tenderness.  Extraocular movements  are intact.  Pupils are equal, round and reactive to light.  Ears normal.  Face with no sign of trauma.  LUNGS:  Clear bilaterally.  The patient has a right subclavian dialysis  catheter.  HEART:  Regular rate and rhythm.  ABDOMEN:  Soft, nontender.  PELVIS:  Stable.  Nontender.  EXTREMITIES:  The patient has a right antecubital AV fistula with a  palpable  thrill.  BACK:  Normal.  NEUROLOGIC:  The patient is sleepy but easily arousable.  She is oriented.  She has equal strength and sensation in all four extremities.   LABORATORY DATA:  White count 10.1, hemoglobin 17.4, platelet count 263.  Potassium 5, BUN 66, creatinine 8. EtOH less than 5, INR 0.9.  CT scan of  the head shows a focal area of subarachnoid hemorrhage in the left posterior  medial temporal lobe.  CT scan of the neck was negative.   IMPRESSION:  Focal left subarachnoid hemorrhage of the left temporal lobe.  Consultant Dr. Carloyn Manner, neurosurgery.   PLAN:  Will admit the patient to the neurosurgery step-down unit for  observation and renal consult for dialysis.      Imogene Burn. Tsuei, M.D.  Electronically Signed     MKT/MEDQ  D:  08/19/2005  T:  08/21/2005  Job:  [id number]

## 2010-11-14 ENCOUNTER — Other Ambulatory Visit (HOSPITAL_COMMUNITY): Payer: Self-pay | Admitting: Obstetrics

## 2010-11-14 DIAGNOSIS — Z1231 Encounter for screening mammogram for malignant neoplasm of breast: Secondary | ICD-10-CM

## 2010-11-16 ENCOUNTER — Emergency Department (HOSPITAL_COMMUNITY)
Admission: EM | Admit: 2010-11-16 | Discharge: 2010-11-16 | Disposition: A | Discharge status: Home / Self Care | Payer: Medicare Other | Attending: Emergency Medicine | Admitting: Emergency Medicine

## 2010-11-16 DIAGNOSIS — M329 Systemic lupus erythematosus, unspecified: Secondary | ICD-10-CM | POA: Insufficient documentation

## 2010-11-16 DIAGNOSIS — E789 Disorder of lipoprotein metabolism, unspecified: Secondary | ICD-10-CM | POA: Insufficient documentation

## 2010-11-16 DIAGNOSIS — F17200 Nicotine dependence, unspecified, uncomplicated: Secondary | ICD-10-CM | POA: Insufficient documentation

## 2010-11-16 DIAGNOSIS — Z79899 Other long term (current) drug therapy: Secondary | ICD-10-CM | POA: Insufficient documentation

## 2010-11-16 DIAGNOSIS — Z992 Dependence on renal dialysis: Secondary | ICD-10-CM | POA: Insufficient documentation

## 2010-11-16 DIAGNOSIS — R630 Anorexia: Secondary | ICD-10-CM | POA: Insufficient documentation

## 2010-11-16 DIAGNOSIS — N289 Disorder of kidney and ureter, unspecified: Secondary | ICD-10-CM | POA: Insufficient documentation

## 2010-11-16 DIAGNOSIS — K920 Hematemesis: Secondary | ICD-10-CM | POA: Insufficient documentation

## 2010-11-16 DIAGNOSIS — I10 Essential (primary) hypertension: Secondary | ICD-10-CM | POA: Insufficient documentation

## 2010-11-16 LAB — HEPATIC FUNCTION PANEL
ALT: 17 U/L (ref 0–35)
Alkaline Phosphatase: 71 U/L (ref 39–117)
Bilirubin, Direct: 0.1 mg/dL (ref 0.0–0.3)
Indirect Bilirubin: 0.2 mg/dL — ABNORMAL LOW (ref 0.3–0.9)
Total Bilirubin: 0.3 mg/dL (ref 0.3–1.2)

## 2010-11-16 LAB — DIFFERENTIAL
Basophils Absolute: 0 10*3/uL (ref 0.0–0.1)
Basophils Relative: 1 % (ref 0–1)
Eosinophils Absolute: 0 10*3/uL (ref 0.0–0.7)
Eosinophils Relative: 0 % (ref 0–5)
Lymphs Abs: 1 10*3/uL (ref 0.7–4.0)
Neutro Abs: 3.7 10*3/uL (ref 1.7–7.7)
Neutrophils Relative %: 73 % (ref 43–77)

## 2010-11-16 LAB — CBC
Hemoglobin: 13.6 g/dL (ref 12.0–15.0)
MCH: 33.2 pg (ref 26.0–34.0)
MCHC: 34.9 g/dL (ref 30.0–36.0)
MCV: 95.1 fL (ref 78.0–100.0)
RBC: 4.1 MIL/uL (ref 3.87–5.11)

## 2010-11-16 LAB — BASIC METABOLIC PANEL
BUN: 32 mg/dL — ABNORMAL HIGH (ref 6–23)
Chloride: 90 mEq/L — ABNORMAL LOW (ref 96–112)
Creatinine, Ser: 6.57 mg/dL — ABNORMAL HIGH (ref 0.50–1.10)
GFR calc Af Amer: 8 mL/min — ABNORMAL LOW (ref >60)

## 2010-12-05 ENCOUNTER — Ambulatory Visit (HOSPITAL_COMMUNITY)
Admission: RE | Admit: 2010-12-05 | Discharge: 2010-12-05 | Disposition: A | Discharge status: Home / Self Care | Payer: Medicare Other | Source: Ambulatory Visit | Attending: Obstetrics | Admitting: Obstetrics

## 2010-12-05 ENCOUNTER — Other Ambulatory Visit: Payer: Self-pay | Admitting: Obstetrics

## 2010-12-05 DIAGNOSIS — Z1231 Encounter for screening mammogram for malignant neoplasm of breast: Secondary | ICD-10-CM | POA: Insufficient documentation

## 2010-12-07 ENCOUNTER — Encounter (HOSPITAL_COMMUNITY)
Admission: RE | Admit: 2010-12-07 | Discharge: 2010-12-07 | Disposition: A | Discharge status: Home / Self Care | Payer: Medicare Other | Source: Ambulatory Visit | Attending: Obstetrics | Admitting: Obstetrics

## 2010-12-07 ENCOUNTER — Encounter (HOSPITAL_COMMUNITY): Payer: Self-pay | Admitting: Anesthesiology

## 2010-12-07 ENCOUNTER — Encounter (HOSPITAL_COMMUNITY): Payer: Self-pay

## 2010-12-07 ENCOUNTER — Ambulatory Visit (HOSPITAL_COMMUNITY): Payer: Medicare Other | Admitting: Anesthesiology

## 2010-12-07 HISTORY — DX: Reserved for concepts with insufficient information to code with codable children: IMO0002

## 2010-12-07 HISTORY — DX: Essential (primary) hypertension: I10

## 2010-12-07 HISTORY — DX: Systemic lupus erythematosus, unspecified: M32.9

## 2010-12-07 HISTORY — DX: Encounter for other specified aftercare: Z51.89

## 2010-12-07 HISTORY — DX: Reserved for inherently not codable concepts without codable children: IMO0001

## 2010-12-07 HISTORY — DX: Gastro-esophageal reflux disease without esophagitis: K21.9

## 2010-12-07 LAB — CBC
HCT: 37 % (ref 36.0–46.0)
Hemoglobin: 11.9 g/dL — ABNORMAL LOW (ref 12.0–15.0)
MCV: 100.5 fL — ABNORMAL HIGH (ref 78.0–100.0)
RDW: 13.5 % (ref 11.5–15.5)
WBC: 4.6 10*3/uL (ref 4.0–10.5)

## 2010-12-07 LAB — BASIC METABOLIC PANEL
BUN: 32 mg/dL — ABNORMAL HIGH (ref 6–23)
CO2: 29 mEq/L (ref 19–32)
Chloride: 91 mEq/L — ABNORMAL LOW (ref 96–112)
Creatinine, Ser: 7.86 mg/dL — ABNORMAL HIGH (ref 0.50–1.10)
GFR calc Af Amer: 7 mL/min — ABNORMAL LOW (ref >60)
Potassium: 3.9 mEq/L (ref 3.5–5.1)

## 2010-12-07 NOTE — Patient Instructions (Addendum)
[place]  12/07/2010   Your procedure is scheduled on:  12/12/10  Report to [facility] at 1030 AM.  Call this number if you have problems the morning of surgery: [phone]   Remember:   Do not eat food:After Midnight.  Do not drink clear liquids: After Midnight.  Take these medicines the morning of surgery with A SIP OF WATER: blood pressure medications   Do not wear jewelry, make-up or nail polish.  Do not bring valuables to the hospital.  Contacts, dentures or bridgework may not be worn into surgery.  Leave suitcase in the car. After surgery it may be brought to your room.  For patients admitted to the hospital, checkout time is 11:00 AM the day of discharge.   Patients discharged the day of surgery will not be allowed to drive home.  Name and phone number of your driver: NA  Special Instructions: use CHG wash 1/2 bottle PM before surgery, 1/2 bottle the AM of surgery  Please read over the following fact sheets that you were given: CHG soap

## 2010-12-07 NOTE — Anesthesia Preprocedure Evaluation (Addendum)
Anesthesia Evaluation  Name, MR# and DOB Patient awake  General Assessment Comment  Reviewed: Allergy & Precautions, H&P , Patient's Chart, lab work & pertinent test results and reviewed documented beta blocker date and time   History of Anesthesia Complications Negative for: history of anesthetic complications  Airway Mallampati: II TM Distance: >3 FB Neck ROM: full    Dental No notable dental hx    Pulmonaryneg pulmonary ROS    clear to auscultation  pulmonary exam normal   Cardiovascular Exercise Tolerance: Good hypertension, regular Normal   Neuro/PsychNegative Neurological ROS Negative Psych ROS  GI/Hepatic/Renal negative GI ROS, negative Liver ROS, and negative Renal ROS (+)  GERD    (+) Dialysis  Endo/Other  Negative Endocrine ROS (+)   Abdominal   Musculoskeletal  Hematology negative hematology ROS (+)   Peds  Reproductive/Obstetrics negative OB ROS   Anesthesia Other Findings             Anesthesia Physical Anesthesia Plan  ASA: II  Anesthesia Plan: General   Post-op Pain Management:    Induction:   Airway Management Planned:   Additional Equipment:   Intra-op Plan:   Post-operative Plan:   Informed Consent: I have reviewed the patients History and Physical, chart, labs and discussed the procedure including the risks, benefits and alternatives for the proposed anesthesia with the patient or authorized representative who has indicated his/her understanding and acceptance.   Dental Advisory Given  Plan Discussed with: CRNA and Surgeon  Anesthesia Plan Comments:         Anesthesia Quick Evaluation

## 2010-12-11 MED ORDER — OXYMETAZOLINE HCL 0.05 % NA SOLN
2.0000 | Freq: Once | NASAL | Status: DC
  Filled 2010-12-11: qty 15

## 2010-12-11 NOTE — H&P (Signed)
NAMEDARIELA, WINKLEPLECK                 ACCOUNT NO.:  [id number]  MEDICAL RECORD NO.:  [id number]  LOCATION:  [facility]                         FACILITY:  [facility]  PHYSICIAN:  Frederico Hamman, M.D.DATE OF BIRTH:  1960/06/07  DATE OF ADMISSION:  11/27/2010 DATE OF DISCHARGE:                             HISTORY & PHYSICAL   DATE OF OPERATION:  December 12, 2010.  HISTORY OF PRESENT ILLNESS:  The patient is a 50 year old patient who is on dialysis.  She comes in with a complaint of dysfunctional uterine bleeding.  She is a gravida 6, para 2-0-4-2.  MEDICATIONS:  Labetalol 300 mg daily, Norvasc 10 mg daily, ranitidine, and PhosLo.  PAST SOCIAL HISTORY:  Basically negative.  PAST MEDICAL HISTORY:  The patient is on active dialysis for 5 years, awaiting a kidney transplant.  SYSTEM REVIEW:  Negative.  FAMILY HISTORY:  Negative.  PHYSICAL EXAMINATION:  HEENT:  Negative.  Lungs:  Clear to percussion and auscultation.  Heart:  Regular rhythm.  No murmurs or gallops. Breasts:  No masses.  Abdomen:  Uterus normal size.  Negative adnexa. Cervix, vagina, external genitalia normal.  Pap smear normal. Extremities:  Negative.          ______________________________ Frederico Hamman, M.D.     BAM/MEDQ  D:  12/11/2010  T:  12/11/2010  Job:  [id number]

## 2010-12-12 ENCOUNTER — Encounter (HOSPITAL_COMMUNITY)
Admission: RE | Disposition: A | Discharge status: Home / Self Care | Payer: Self-pay | Source: Ambulatory Visit | Attending: Obstetrics

## 2010-12-12 ENCOUNTER — Ambulatory Visit (HOSPITAL_COMMUNITY)
Admission: RE | Admit: 2010-12-12 | Discharge: 2010-12-12 | Disposition: A | Discharge status: Home / Self Care | Payer: Medicare Other | Source: Ambulatory Visit | Attending: Obstetrics | Admitting: Obstetrics

## 2010-12-12 ENCOUNTER — Encounter (HOSPITAL_COMMUNITY): Payer: Self-pay | Admitting: Anesthesiology

## 2010-12-12 DIAGNOSIS — N938 Other specified abnormal uterine and vaginal bleeding: Secondary | ICD-10-CM | POA: Insufficient documentation

## 2010-12-12 DIAGNOSIS — Z01812 Encounter for preprocedural laboratory examination: Secondary | ICD-10-CM | POA: Insufficient documentation

## 2010-12-12 DIAGNOSIS — Z01818 Encounter for other preprocedural examination: Secondary | ICD-10-CM | POA: Insufficient documentation

## 2010-12-12 DIAGNOSIS — N949 Unspecified condition associated with female genital organs and menstrual cycle: Secondary | ICD-10-CM | POA: Insufficient documentation

## 2010-12-12 LAB — ELECTROLYTE PANEL
Chloride: 93 mEq/L — ABNORMAL LOW (ref 96–112)
Potassium: 4.5 mEq/L (ref 3.5–5.1)

## 2010-12-12 SURGERY — DILATATION AND CURETTAGE /HYSTEROSCOPY
Anesthesia: Choice

## 2010-12-12 MED ORDER — FAMOTIDINE 20 MG PO TABS: 20.0000 mg | ORAL_TABLET | Freq: Once | ORAL | Status: DC

## 2010-12-12 MED ORDER — ATROPINE SULFATE 0.4 MG/ML IJ SOLN
0.4000 mg | Freq: Once | INTRAMUSCULAR | Status: DC | PRN
  Filled 2010-12-12: qty 1

## 2010-12-12 MED ORDER — PANTOPRAZOLE SODIUM 40 MG PO TBEC: 40.0000 mg | DELAYED_RELEASE_TABLET | Freq: Once | ORAL | Status: DC

## 2010-12-12 MED ORDER — ACETAMINOPHEN 10 MG/ML IV SOLN: 1000.0000 mg | Freq: Once | INTRAVENOUS | Status: DC | PRN

## 2010-12-12 MED ORDER — PROPOFOL 10 MG/ML IV EMUL
INTRAVENOUS | Status: AC
  Filled 2010-12-12: qty 20

## 2010-12-12 MED ORDER — PROMETHAZINE HCL 25 MG/ML IJ SOLN: 6.2500 mg | INTRAMUSCULAR | Status: DC | PRN

## 2010-12-12 MED ORDER — ONDANSETRON HCL 4 MG/2ML IJ SOLN
INTRAMUSCULAR | Status: AC
  Filled 2010-12-12: qty 2

## 2010-12-12 MED ORDER — FENTANYL CITRATE 0.05 MG/ML IJ SOLN: 50.0000 ug | INTRAMUSCULAR | Status: DC | PRN

## 2010-12-12 MED ORDER — MIDAZOLAM HCL 2 MG/2ML IJ SOLN: 0.5000 mg | INTRAMUSCULAR | Status: DC | PRN

## 2010-12-12 MED ORDER — DEXAMETHASONE SODIUM PHOSPHATE 10 MG/ML IJ SOLN
INTRAMUSCULAR | Status: AC
  Filled 2010-12-12: qty 1

## 2010-12-12 MED ORDER — MIDAZOLAM HCL 2 MG/2ML IJ SOLN
INTRAMUSCULAR | Status: AC
  Filled 2010-12-12: qty 2

## 2010-12-12 MED ORDER — ACETAMINOPHEN 325 MG PO TABS: 325.0000 mg | ORAL_TABLET | ORAL | Status: DC | PRN

## 2010-12-12 MED ORDER — HYDROMORPHONE HCL 1 MG/ML IJ SOLN: 0.2500 mg | INTRAMUSCULAR | Status: DC | PRN

## 2010-12-12 MED ORDER — ONDANSETRON HCL 4 MG/2ML IJ SOLN: 4.0000 mg | Freq: Once | INTRAMUSCULAR | Status: DC

## 2010-12-12 MED ORDER — DIPHENHYDRAMINE HCL 50 MG/ML IJ SOLN: 6.2500 mg | Freq: Once | INTRAMUSCULAR | Status: DC

## 2010-12-12 MED ORDER — LIDOCAINE HCL (CARDIAC) 20 MG/ML IV SOLN
INTRAVENOUS | Status: AC
  Filled 2010-12-12: qty 5

## 2010-12-12 MED ORDER — CITRIC ACID-SODIUM CITRATE 334-500 MG/5ML PO SOLN: 30.0000 mL | Freq: Once | ORAL | Status: DC

## 2010-12-12 MED ORDER — GLYCOPYRROLATE 0.2 MG/ML IJ SOLN
0.2000 mg | Freq: Once | INTRAMUSCULAR | Status: DC | PRN
  Filled 2010-12-12: qty 1

## 2010-12-12 MED ORDER — FENTANYL CITRATE 0.05 MG/ML IJ SOLN
INTRAMUSCULAR | Status: AC
  Filled 2010-12-12: qty 2

## 2010-12-12 MED ORDER — SODIUM CHLORIDE 0.9 % IV SOLN
INTRAVENOUS | Status: DC
  Administered 2010-12-12: 12:00:00 via INTRAVENOUS

## 2010-12-12 MED ORDER — SCOPOLAMINE 1 MG/3DAYS TD PT72: 1.0000 | MEDICATED_PATCH | Freq: Once | TRANSDERMAL | Status: DC

## 2010-12-12 MED ORDER — MIDAZOLAM HCL 2 MG/2ML IJ SOLN: 1.0000 mg | INTRAMUSCULAR | Status: DC | PRN

## 2010-12-12 MED ORDER — MEPERIDINE HCL 25 MG/ML IJ SOLN: 6.2500 mg | INTRAMUSCULAR | Status: DC | PRN

## 2010-12-12 SURGICAL SUPPLY — 16 items
CANISTER SUCTION 2500CC (MISCELLANEOUS) ×1 IMPLANT
CATH ROBINSON RED A/P 16FR (CATHETERS) ×1 IMPLANT
CLOTH BEACON ORANGE TIMEOUT ST (SAFETY) ×1 IMPLANT
CONTAINER PREFILL 10% NBF 60ML (FORM) ×2 IMPLANT
DRAPE UTILITY XL STRL (DRAPES) ×1 IMPLANT
ELECT REM PT RETURN 9FT ADLT (ELECTROSURGICAL)
ELECTRODE REM PT RTRN 9FT ADLT (ELECTROSURGICAL) IMPLANT
ELECTRODE ROLLER BARREL 22FR (ELECTROSURGICAL) IMPLANT
ELECTRODE VAPORCUT 22FR (ELECTROSURGICAL) IMPLANT
GLOVE BIO SURGEON STRL SZ8.5 (GLOVE) ×2 IMPLANT
GOWN PREVENTION PLUS LG XLONG (DISPOSABLE) ×1 IMPLANT
GOWN PREVENTION PLUS XXLARGE (GOWN DISPOSABLE) ×1 IMPLANT
LOOP ANGLED CUTTING 22FR (CUTTING LOOP) IMPLANT
PACK HYSTEROSCOPY LF (CUSTOM PROCEDURE TRAY) ×1 IMPLANT
TOWEL OR 17X24 6PK STRL BLUE (TOWEL DISPOSABLE) IMPLANT
WATER STERILE IRR 1000ML POUR (IV SOLUTION) ×1 IMPLANT

## 2010-12-12 NOTE — Progress Notes (Signed)
Surgery cancelled per pt.  D/C'd to home with family.  Plans to reschedule for another day.

## 2010-12-18 NOTE — H&P (Signed)
NAMEDEOLINDA, [name]                 ACCOUNT NO.:  [id number]  MEDICAL RECORD NO.:  [id number]  LOCATION:  [facility]                          FACILITY:  [facility]  PHYSICIAN:  Frederico Hamman, M.D.DATE OF BIRTH:  04-16-1961  DATE OF ADMISSION:  12/12/2010 DATE OF DISCHARGE:  12/12/2010                             HISTORY & PHYSICAL   ADDENDUM  The patient is a 50 year old female who was scheduled for hysteroscopy and D and C on December 12, 2010.  However, at that time, the patient decided she wants to have the additional procedure for HTA done and those arrangements had not been made, so she is scheduled for hysteroscopy and D and C and HTA on Tuesday, December 19, 2010.  She is a patient who is on dialysis and her medications and physical have already been dictated.          ______________________________ Frederico Hamman, M.D.     BAM/MEDQ  D:  12/18/2010  T:  12/18/2010  Job:  [id number]

## 2010-12-19 ENCOUNTER — Ambulatory Visit (HOSPITAL_COMMUNITY): Payer: Medicare Other | Admitting: Anesthesiology

## 2010-12-19 ENCOUNTER — Encounter (HOSPITAL_COMMUNITY): Payer: Self-pay | Admitting: Obstetrics

## 2010-12-19 ENCOUNTER — Ambulatory Visit (HOSPITAL_COMMUNITY)
Admission: RE | Admit: 2010-12-19 | Discharge: 2010-12-19 | Disposition: A | Discharge status: Home / Self Care | Payer: Medicare Other | Source: Ambulatory Visit | Attending: Obstetrics | Admitting: Obstetrics

## 2010-12-19 ENCOUNTER — Encounter (HOSPITAL_COMMUNITY): Payer: Self-pay | Admitting: Anesthesiology

## 2010-12-19 ENCOUNTER — Encounter (HOSPITAL_COMMUNITY)
Admission: RE | Disposition: A | Discharge status: Home / Self Care | Payer: Self-pay | Source: Ambulatory Visit | Attending: Obstetrics

## 2010-12-19 ENCOUNTER — Other Ambulatory Visit: Payer: Self-pay | Admitting: Obstetrics

## 2010-12-19 DIAGNOSIS — I110 Hypertensive heart disease with heart failure: Secondary | ICD-10-CM

## 2010-12-19 DIAGNOSIS — N949 Unspecified condition associated with female genital organs and menstrual cycle: Secondary | ICD-10-CM | POA: Insufficient documentation

## 2010-12-19 DIAGNOSIS — N938 Other specified abnormal uterine and vaginal bleeding: Secondary | ICD-10-CM | POA: Insufficient documentation

## 2010-12-19 DIAGNOSIS — Z992 Dependence on renal dialysis: Secondary | ICD-10-CM | POA: Insufficient documentation

## 2010-12-19 HISTORY — DX: Other specified abnormal uterine and vaginal bleeding: N93.8

## 2010-12-19 LAB — BASIC METABOLIC PANEL
Calcium: 10.7 mg/dL — ABNORMAL HIGH (ref 8.4–10.5)
Chloride: 93 mEq/L — ABNORMAL LOW (ref 96–112)
Creatinine, Ser: 7.16 mg/dL — ABNORMAL HIGH (ref 0.50–1.10)
GFR calc Af Amer: 7 mL/min — ABNORMAL LOW (ref >60)
Sodium: 133 mEq/L — ABNORMAL LOW (ref 135–145)

## 2010-12-19 SURGERY — DILATATION & CURETTAGE/HYSTEROSCOPY WITH HYDROTHERMAL ABLATION
Anesthesia: General | Site: Vagina | Wound class: Clean Contaminated

## 2010-12-19 MED ORDER — SODIUM CHLORIDE 0.9 % IR SOLN
Status: DC | PRN
  Administered 2010-12-19: 3000 mL

## 2010-12-19 MED ORDER — ACETAMINOPHEN-CODEINE #3 300-30 MG PO TABS
2.0000 | ORAL_TABLET | ORAL | Status: AC | PRN
  Administered 2010-12-19: 1 via ORAL

## 2010-12-19 MED ORDER — FENTANYL CITRATE 0.05 MG/ML IJ SOLN
INTRAMUSCULAR | Status: AC
  Filled 2010-12-19: qty 2

## 2010-12-19 MED ORDER — MEPERIDINE HCL 25 MG/ML IJ SOLN: 6.2500 mg | INTRAMUSCULAR | Status: DC | PRN

## 2010-12-19 MED ORDER — SODIUM CHLORIDE 0.9 % IV SOLN
INTRAVENOUS | Status: DC | PRN
  Administered 2010-12-19: 12:00:00 via INTRAVENOUS

## 2010-12-19 MED ORDER — METOCLOPRAMIDE HCL 5 MG/ML IJ SOLN: 10.0000 mg | Freq: Once | INTRAMUSCULAR | Status: DC | PRN

## 2010-12-19 MED ORDER — ACETAMINOPHEN-CODEINE 300-30 MG PO TABS: 1.0000 | ORAL_TABLET | ORAL | Status: AC | PRN

## 2010-12-19 MED ORDER — PROPOFOL 10 MG/ML IV EMUL
INTRAVENOUS | Status: AC
  Filled 2010-12-19: qty 20

## 2010-12-19 MED ORDER — LIDOCAINE HCL 1 % IJ SOLN
INTRAMUSCULAR | Status: DC | PRN
  Administered 2010-12-19: 10 mL
  Administered 2010-12-19: 8 mL

## 2010-12-19 MED ORDER — KETOROLAC TROMETHAMINE 30 MG/ML IJ SOLN
INTRAMUSCULAR | Status: AC
  Filled 2010-12-19: qty 1

## 2010-12-19 MED ORDER — DEXAMETHASONE SODIUM PHOSPHATE 10 MG/ML IJ SOLN
INTRAMUSCULAR | Status: AC
  Filled 2010-12-19: qty 1

## 2010-12-19 MED ORDER — SODIUM CHLORIDE 0.9 % IV SOLN
INTRAVENOUS | Status: DC
  Administered 2010-12-19: 11:00:00 via INTRAVENOUS

## 2010-12-19 MED ORDER — ONDANSETRON HCL 4 MG/2ML IJ SOLN
INTRAMUSCULAR | Status: DC | PRN
  Administered 2010-12-19: 4 mg via INTRAVENOUS

## 2010-12-19 MED ORDER — FENTANYL CITRATE 0.05 MG/ML IJ SOLN
25.0000 ug | INTRAMUSCULAR | Status: DC | PRN
  Administered 2010-12-19 (×2): 25 ug via INTRAVENOUS

## 2010-12-19 MED ORDER — ACETAMINOPHEN 325 MG PO TABS: 325.0000 mg | ORAL_TABLET | ORAL | Status: DC | PRN

## 2010-12-19 MED ORDER — FENTANYL CITRATE 0.05 MG/ML IJ SOLN
INTRAMUSCULAR | Status: AC
  Administered 2010-12-19: 25 ug via INTRAVENOUS
  Filled 2010-12-19: qty 2

## 2010-12-19 MED ORDER — LIDOCAINE HCL (CARDIAC) 20 MG/ML IV SOLN
INTRAVENOUS | Status: DC | PRN
  Administered 2010-12-19: 60 mg via INTRAVENOUS

## 2010-12-19 MED ORDER — PROPOFOL 10 MG/ML IV EMUL
INTRAVENOUS | Status: DC | PRN
  Administered 2010-12-19: 50 mg via INTRAVENOUS
  Administered 2010-12-19: 150 mg via INTRAVENOUS

## 2010-12-19 MED ORDER — MIDAZOLAM HCL 2 MG/2ML IJ SOLN
INTRAMUSCULAR | Status: AC
  Filled 2010-12-19: qty 2

## 2010-12-19 MED ORDER — MIDAZOLAM HCL 5 MG/5ML IJ SOLN
INTRAMUSCULAR | Status: DC | PRN
  Administered 2010-12-19: 2 mg via INTRAVENOUS

## 2010-12-19 MED ORDER — FENTANYL CITRATE 0.05 MG/ML IJ SOLN
INTRAMUSCULAR | Status: DC | PRN
  Administered 2010-12-19: 50 ug via INTRAVENOUS

## 2010-12-19 MED ORDER — LIDOCAINE HCL (CARDIAC) 20 MG/ML IV SOLN
INTRAVENOUS | Status: AC
  Filled 2010-12-19: qty 5

## 2010-12-19 MED ORDER — ONDANSETRON HCL 4 MG/2ML IJ SOLN
INTRAMUSCULAR | Status: AC
  Filled 2010-12-19: qty 2

## 2010-12-19 SURGICAL SUPPLY — 13 items
CATH ROBINSON RED A/P 16FR (CATHETERS) ×2 IMPLANT
CLOTH BEACON ORANGE TIMEOUT ST (SAFETY) ×2 IMPLANT
CONTAINER PREFILL 10% NBF 60ML (FORM) ×3 IMPLANT
DRAPE CAMERA CLOSED 9X96 (DRAPES) ×2 IMPLANT
DRAPE HYSTEROSCOPY (DRAPE) ×2 IMPLANT
DRAPE UTILITY XL STRL (DRAPES) ×2 IMPLANT
GLOVE BIO SURGEON STRL SZ8.5 (GLOVE) ×4 IMPLANT
GOWN PREVENTION PLUS LG XLONG (DISPOSABLE) ×2 IMPLANT
GOWN PREVENTION PLUS XXLARGE (GOWN DISPOSABLE) ×2 IMPLANT
PACK VAGINAL MINOR WOMEN LF (CUSTOM PROCEDURE TRAY) ×2 IMPLANT
SET GENESYS HTA PROCERVA (MISCELLANEOUS) ×2 IMPLANT
SLEEVE SCD COMPRESS KNEE MED (MISCELLANEOUS) ×2 IMPLANT
TOWEL OR 17X24 6PK STRL BLUE (TOWEL DISPOSABLE) ×4 IMPLANT

## 2010-12-19 NOTE — Transfer of Care (Signed)
Immediate Anesthesia Transfer of Care Note  Patient: [name]  Procedure(s) Performed:  DILATATION & CURETTAGE/HYSTEROSCOPY WITH HYDROTHERMAL ABLATION  Patient Location: PACU  Anesthesia Type: General  Level of Consciousness: awake, alert  and oriented  Airway & Oxygen Therapy: Patient Spontanous Breathing and Patient connected to nasal cannula oxygen  Post-op Assessment: Report given to PACU RN  Post vital signs: stable  Complications: No apparent anesthesia complications

## 2010-12-19 NOTE — Anesthesia Preprocedure Evaluation (Addendum)
Anesthesia Evaluation  Name, MR# and DOB Patient awake  General Assessment Comment  Reviewed: Allergy & Precautions, H&P , Patient's Chart, lab work & pertinent test results and reviewed documented beta blocker date and time   Airway Mallampati: II TM Distance: >3 FB Neck ROM: full   Comment: Poor dentition Dental No notable dental hx    Pulmonary  clear to auscultation  pulmonary exam normal   Cardiovascular hypertension (DBP 90's today), Pt. on medications regular Normal   Neuro/Psych  GI/Hepatic/Renal (+)  GERD (only takes meds with certain foods)      Endo/Other   Abdominal   Musculoskeletal  Hematology   Peds  Reproductive/Obstetrics   Anesthesia Other Findings             Anesthesia Physical Anesthesia Plan  ASA: III  Anesthesia Plan: General   Post-op Pain Management:    Induction: Intravenous  Airway Management Planned: LMA  Additional Equipment:   Intra-op Plan:   Post-operative Plan:   Informed Consent: I have reviewed the patients History and Physical, chart, labs and discussed the procedure including the risks, benefits and alternatives for the proposed anesthesia with the patient or authorized representative who has indicated his/her understanding and acceptance.   Dental Advisory Given  Plan Discussed with: CRNA and Surgeon  Anesthesia Plan Comments: (  Discussed  general anesthesia, including possible nausea, instrumentation of airway, sore throat,pulmonary aspiration, etc. I asked if the were any outstanding questions, or  concerns before we proceeded. )       Anesthesia Quick Evaluation

## 2010-12-19 NOTE — Op Note (Signed)
This is Dr. Gracy Racer dictating the operative note on [name] preop diagnosis dysfunctional uterine bleeding Postop diagnosis same Anesthesia Gen. Procedure under general anesthesia patient in lithotomy position perineum and vagina prepped and draped Bimanual exam the uterus was normal size negative adnexa Speculum placed the vagina and the cervix injected with 10 cc of 1% Xylocaine Anterior lip of the cervix grasped tenaculum The cervical canal sounded 10 cm and the cervix dilated to #27 [name] Hysteroscopy was was performed and showed adhesions in the endometrial cavity The hysteroscope was removed and a sharp curettage performed moderate amount of tissue obtained Hysteroscope reinserted and is to proceed to 2 minutes normal 10 minutes cord teratomatous coulomb was performed Patient tolerated procedure well taken to recovery room in good condition end of dictation

## 2010-12-19 NOTE — Anesthesia Postprocedure Evaluation (Signed)
  Anesthesia Post-op Note  Patient: [name]  Procedure(s) Performed:  DILATATION & CURETTAGE/HYSTEROSCOPY WITH HYDROTHERMAL ABLATION  Patient is awake and responsive. Pain and nausea are reasonably well controlled. Vital signs are stable and clinically acceptable. Oxygen saturation is clinically acceptable. There are no apparent anesthetic complications at this time. Patient is ready for discharge.

## 2010-12-19 NOTE — Anesthesia Procedure Notes (Signed)
Procedures

## 2011-01-29 ENCOUNTER — Other Ambulatory Visit (HOSPITAL_COMMUNITY): Payer: Self-pay | Admitting: Nephrology

## 2011-01-29 DIAGNOSIS — N186 End stage renal disease: Secondary | ICD-10-CM

## 2011-02-06 ENCOUNTER — Other Ambulatory Visit (HOSPITAL_COMMUNITY): Payer: Medicare Other

## 2011-05-23 DIAGNOSIS — Z992 Dependence on renal dialysis: Secondary | ICD-10-CM | POA: Diagnosis not present

## 2011-05-23 DIAGNOSIS — N186 End stage renal disease: Secondary | ICD-10-CM | POA: Diagnosis not present

## 2011-05-23 DIAGNOSIS — D509 Iron deficiency anemia, unspecified: Secondary | ICD-10-CM | POA: Diagnosis not present

## 2011-05-23 DIAGNOSIS — D631 Anemia in chronic kidney disease: Secondary | ICD-10-CM | POA: Diagnosis not present

## 2011-05-23 DIAGNOSIS — N2581 Secondary hyperparathyroidism of renal origin: Secondary | ICD-10-CM | POA: Diagnosis not present

## 2011-06-13 DIAGNOSIS — E785 Hyperlipidemia, unspecified: Secondary | ICD-10-CM | POA: Diagnosis not present

## 2011-06-21 DIAGNOSIS — N186 End stage renal disease: Secondary | ICD-10-CM | POA: Diagnosis not present

## 2011-06-22 DIAGNOSIS — N2581 Secondary hyperparathyroidism of renal origin: Secondary | ICD-10-CM | POA: Diagnosis not present

## 2011-06-22 DIAGNOSIS — D509 Iron deficiency anemia, unspecified: Secondary | ICD-10-CM | POA: Diagnosis not present

## 2011-06-22 DIAGNOSIS — N186 End stage renal disease: Secondary | ICD-10-CM | POA: Diagnosis not present

## 2011-06-22 DIAGNOSIS — D631 Anemia in chronic kidney disease: Secondary | ICD-10-CM | POA: Diagnosis not present

## 2011-07-18 DIAGNOSIS — D509 Iron deficiency anemia, unspecified: Secondary | ICD-10-CM | POA: Diagnosis not present

## 2011-07-18 DIAGNOSIS — N2581 Secondary hyperparathyroidism of renal origin: Secondary | ICD-10-CM | POA: Diagnosis not present

## 2011-07-18 DIAGNOSIS — N186 End stage renal disease: Secondary | ICD-10-CM | POA: Diagnosis not present

## 2011-07-19 DIAGNOSIS — N186 End stage renal disease: Secondary | ICD-10-CM | POA: Diagnosis not present

## 2011-07-20 DIAGNOSIS — N186 End stage renal disease: Secondary | ICD-10-CM | POA: Diagnosis not present

## 2011-07-20 DIAGNOSIS — D509 Iron deficiency anemia, unspecified: Secondary | ICD-10-CM | POA: Diagnosis not present

## 2011-07-20 DIAGNOSIS — N2581 Secondary hyperparathyroidism of renal origin: Secondary | ICD-10-CM | POA: Diagnosis not present

## 2011-07-20 DIAGNOSIS — D631 Anemia in chronic kidney disease: Secondary | ICD-10-CM | POA: Diagnosis not present

## 2011-08-19 DIAGNOSIS — N186 End stage renal disease: Secondary | ICD-10-CM | POA: Diagnosis not present

## 2011-08-20 DIAGNOSIS — D509 Iron deficiency anemia, unspecified: Secondary | ICD-10-CM | POA: Diagnosis not present

## 2011-08-20 DIAGNOSIS — N186 End stage renal disease: Secondary | ICD-10-CM | POA: Diagnosis not present

## 2011-08-20 DIAGNOSIS — D631 Anemia in chronic kidney disease: Secondary | ICD-10-CM | POA: Diagnosis not present

## 2011-08-20 DIAGNOSIS — N2581 Secondary hyperparathyroidism of renal origin: Secondary | ICD-10-CM | POA: Diagnosis not present

## 2011-09-05 DIAGNOSIS — E785 Hyperlipidemia, unspecified: Secondary | ICD-10-CM | POA: Diagnosis not present

## 2011-09-18 DIAGNOSIS — N186 End stage renal disease: Secondary | ICD-10-CM | POA: Diagnosis not present

## 2011-09-19 DIAGNOSIS — N186 End stage renal disease: Secondary | ICD-10-CM | POA: Diagnosis not present

## 2011-09-19 DIAGNOSIS — D509 Iron deficiency anemia, unspecified: Secondary | ICD-10-CM | POA: Diagnosis not present

## 2011-09-19 DIAGNOSIS — D631 Anemia in chronic kidney disease: Secondary | ICD-10-CM | POA: Diagnosis not present

## 2011-09-19 DIAGNOSIS — N2581 Secondary hyperparathyroidism of renal origin: Secondary | ICD-10-CM | POA: Diagnosis not present

## 2011-10-19 DIAGNOSIS — N186 End stage renal disease: Secondary | ICD-10-CM | POA: Diagnosis not present

## 2011-10-22 DIAGNOSIS — N186 End stage renal disease: Secondary | ICD-10-CM | POA: Diagnosis not present

## 2011-10-22 DIAGNOSIS — D631 Anemia in chronic kidney disease: Secondary | ICD-10-CM | POA: Diagnosis not present

## 2011-10-22 DIAGNOSIS — D509 Iron deficiency anemia, unspecified: Secondary | ICD-10-CM | POA: Diagnosis not present

## 2011-10-22 DIAGNOSIS — N2581 Secondary hyperparathyroidism of renal origin: Secondary | ICD-10-CM | POA: Diagnosis not present

## 2011-11-18 DIAGNOSIS — N186 End stage renal disease: Secondary | ICD-10-CM | POA: Diagnosis not present

## 2011-11-19 DIAGNOSIS — D509 Iron deficiency anemia, unspecified: Secondary | ICD-10-CM | POA: Diagnosis not present

## 2011-11-19 DIAGNOSIS — N186 End stage renal disease: Secondary | ICD-10-CM | POA: Diagnosis not present

## 2011-11-19 DIAGNOSIS — N2581 Secondary hyperparathyroidism of renal origin: Secondary | ICD-10-CM | POA: Diagnosis not present

## 2011-11-19 DIAGNOSIS — D631 Anemia in chronic kidney disease: Secondary | ICD-10-CM | POA: Diagnosis not present

## 2011-11-28 ENCOUNTER — Other Ambulatory Visit (HOSPITAL_COMMUNITY): Payer: Self-pay | Admitting: Nephrology

## 2011-11-28 DIAGNOSIS — N186 End stage renal disease: Secondary | ICD-10-CM

## 2011-12-04 ENCOUNTER — Ambulatory Visit (HOSPITAL_COMMUNITY)
Admission: RE | Admit: 2011-12-04 | Discharge: 2011-12-04 | Disposition: A | Discharge status: Home / Self Care | Payer: Medicare Other | Source: Ambulatory Visit | Attending: Nephrology | Admitting: Nephrology

## 2011-12-04 DIAGNOSIS — Z0389 Encounter for observation for other suspected diseases and conditions ruled out: Secondary | ICD-10-CM | POA: Insufficient documentation

## 2011-12-04 DIAGNOSIS — Z992 Dependence on renal dialysis: Secondary | ICD-10-CM | POA: Insufficient documentation

## 2011-12-04 DIAGNOSIS — N186 End stage renal disease: Secondary | ICD-10-CM

## 2011-12-04 DIAGNOSIS — T82898A Other specified complication of vascular prosthetic devices, implants and grafts, initial encounter: Secondary | ICD-10-CM | POA: Diagnosis not present

## 2011-12-04 MED ORDER — IOHEXOL 300 MG/ML  SOLN
100.0000 mL | Freq: Once | INTRAMUSCULAR | Status: AC | PRN
  Administered 2011-12-04: 70 mL via INTRAVENOUS

## 2011-12-05 ENCOUNTER — Telehealth (HOSPITAL_COMMUNITY): Payer: Self-pay | Admitting: *Deleted

## 2011-12-05 DIAGNOSIS — E785 Hyperlipidemia, unspecified: Secondary | ICD-10-CM | POA: Diagnosis not present

## 2011-12-05 NOTE — Telephone Encounter (Signed)
Post procedure follow up call.  No answer, VM left for pt to return call with any questions or concers

## 2011-12-19 DIAGNOSIS — N186 End stage renal disease: Secondary | ICD-10-CM | POA: Diagnosis not present

## 2011-12-21 DIAGNOSIS — N186 End stage renal disease: Secondary | ICD-10-CM | POA: Diagnosis not present

## 2011-12-21 DIAGNOSIS — D631 Anemia in chronic kidney disease: Secondary | ICD-10-CM | POA: Diagnosis not present

## 2011-12-21 DIAGNOSIS — D509 Iron deficiency anemia, unspecified: Secondary | ICD-10-CM | POA: Diagnosis not present

## 2011-12-21 DIAGNOSIS — N2581 Secondary hyperparathyroidism of renal origin: Secondary | ICD-10-CM | POA: Diagnosis not present

## 2011-12-25 DIAGNOSIS — M76899 Other specified enthesopathies of unspecified lower limb, excluding foot: Secondary | ICD-10-CM | POA: Diagnosis not present

## 2012-01-08 ENCOUNTER — Emergency Department (HOSPITAL_COMMUNITY)
Admission: EM | Admit: 2012-01-08 | Discharge: 2012-01-11 | Discharge status: Against Medical Advice | Payer: Medicare Other | Attending: Emergency Medicine | Admitting: Emergency Medicine

## 2012-01-08 ENCOUNTER — Encounter (HOSPITAL_COMMUNITY): Payer: Self-pay | Admitting: *Deleted

## 2012-01-08 DIAGNOSIS — R109 Unspecified abdominal pain: Secondary | ICD-10-CM | POA: Diagnosis not present

## 2012-01-08 DIAGNOSIS — R1110 Vomiting, unspecified: Secondary | ICD-10-CM | POA: Insufficient documentation

## 2012-01-08 LAB — BASIC METABOLIC PANEL
CO2: 31 mEq/L (ref 19–32)
Calcium: 10.8 mg/dL — ABNORMAL HIGH (ref 8.4–10.5)
Creatinine, Ser: 7.87 mg/dL — ABNORMAL HIGH (ref 0.50–1.10)
GFR calc non Af Amer: 5 mL/min — ABNORMAL LOW (ref >90)
Glucose, Bld: 105 mg/dL — ABNORMAL HIGH (ref 70–99)
Sodium: 140 mEq/L (ref 135–145)

## 2012-01-08 LAB — CBC
Hemoglobin: 13.1 g/dL (ref 12.0–15.0)
MCH: 33 pg (ref 26.0–34.0)
MCHC: 33.9 g/dL (ref 30.0–36.0)
MCV: 97.5 fL (ref 78.0–100.0)

## 2012-01-08 MED ORDER — ONDANSETRON 4 MG PO TBDP
8.0000 mg | ORAL_TABLET | Freq: Once | ORAL | Status: AC
  Administered 2012-01-08: 8 mg via ORAL
  Filled 2012-01-08: qty 2

## 2012-01-08 NOTE — ED Notes (Signed)
Pt c/o upper abdominal pain and vomitng all day today, vomiting in triage.  Denies CP, SOB.

## 2012-01-19 DIAGNOSIS — N186 End stage renal disease: Secondary | ICD-10-CM | POA: Diagnosis not present

## 2012-01-21 DIAGNOSIS — D509 Iron deficiency anemia, unspecified: Secondary | ICD-10-CM | POA: Diagnosis not present

## 2012-01-21 DIAGNOSIS — N186 End stage renal disease: Secondary | ICD-10-CM | POA: Diagnosis not present

## 2012-01-21 DIAGNOSIS — N2581 Secondary hyperparathyroidism of renal origin: Secondary | ICD-10-CM | POA: Diagnosis not present

## 2012-01-21 DIAGNOSIS — D631 Anemia in chronic kidney disease: Secondary | ICD-10-CM | POA: Diagnosis not present

## 2012-01-23 DIAGNOSIS — N939 Abnormal uterine and vaginal bleeding, unspecified: Secondary | ICD-10-CM | POA: Diagnosis not present

## 2012-01-23 DIAGNOSIS — Z3480 Encounter for supervision of other normal pregnancy, unspecified trimester: Secondary | ICD-10-CM | POA: Diagnosis not present

## 2012-01-23 DIAGNOSIS — Z113 Encounter for screening for infections with a predominantly sexual mode of transmission: Secondary | ICD-10-CM | POA: Diagnosis not present

## 2012-01-23 DIAGNOSIS — N898 Other specified noninflammatory disorders of vagina: Secondary | ICD-10-CM | POA: Diagnosis not present

## 2012-01-28 ENCOUNTER — Other Ambulatory Visit (HOSPITAL_COMMUNITY): Payer: Self-pay | Admitting: Obstetrics

## 2012-01-28 DIAGNOSIS — Z1231 Encounter for screening mammogram for malignant neoplasm of breast: Secondary | ICD-10-CM

## 2012-02-18 DIAGNOSIS — N186 End stage renal disease: Secondary | ICD-10-CM | POA: Diagnosis not present

## 2012-02-19 ENCOUNTER — Ambulatory Visit (HOSPITAL_COMMUNITY): Payer: Medicare Other | Attending: Obstetrics

## 2012-02-20 DIAGNOSIS — D631 Anemia in chronic kidney disease: Secondary | ICD-10-CM | POA: Diagnosis not present

## 2012-02-20 DIAGNOSIS — N186 End stage renal disease: Secondary | ICD-10-CM | POA: Diagnosis not present

## 2012-02-20 DIAGNOSIS — N2581 Secondary hyperparathyroidism of renal origin: Secondary | ICD-10-CM | POA: Diagnosis not present

## 2012-02-20 DIAGNOSIS — D509 Iron deficiency anemia, unspecified: Secondary | ICD-10-CM | POA: Diagnosis not present

## 2012-03-05 DIAGNOSIS — E785 Hyperlipidemia, unspecified: Secondary | ICD-10-CM | POA: Diagnosis not present

## 2012-03-18 ENCOUNTER — Ambulatory Visit (HOSPITAL_COMMUNITY): Payer: Medicare Other | Attending: Obstetrics

## 2012-03-20 DIAGNOSIS — N186 End stage renal disease: Secondary | ICD-10-CM | POA: Diagnosis not present

## 2012-03-21 DIAGNOSIS — N186 End stage renal disease: Secondary | ICD-10-CM | POA: Diagnosis not present

## 2012-03-21 DIAGNOSIS — D631 Anemia in chronic kidney disease: Secondary | ICD-10-CM | POA: Diagnosis not present

## 2012-03-21 DIAGNOSIS — D509 Iron deficiency anemia, unspecified: Secondary | ICD-10-CM | POA: Diagnosis not present

## 2012-03-21 DIAGNOSIS — N2581 Secondary hyperparathyroidism of renal origin: Secondary | ICD-10-CM | POA: Diagnosis not present

## 2012-04-19 DIAGNOSIS — N186 End stage renal disease: Secondary | ICD-10-CM | POA: Diagnosis not present

## 2012-04-21 DIAGNOSIS — N2581 Secondary hyperparathyroidism of renal origin: Secondary | ICD-10-CM | POA: Diagnosis not present

## 2012-04-21 DIAGNOSIS — N186 End stage renal disease: Secondary | ICD-10-CM | POA: Diagnosis not present

## 2012-04-21 DIAGNOSIS — D631 Anemia in chronic kidney disease: Secondary | ICD-10-CM | POA: Diagnosis not present

## 2012-04-21 DIAGNOSIS — D509 Iron deficiency anemia, unspecified: Secondary | ICD-10-CM | POA: Diagnosis not present

## 2012-05-20 DIAGNOSIS — N186 End stage renal disease: Secondary | ICD-10-CM | POA: Diagnosis not present

## 2012-05-23 DIAGNOSIS — E785 Hyperlipidemia, unspecified: Secondary | ICD-10-CM | POA: Diagnosis not present

## 2012-05-23 DIAGNOSIS — N2581 Secondary hyperparathyroidism of renal origin: Secondary | ICD-10-CM | POA: Diagnosis not present

## 2012-05-23 DIAGNOSIS — D631 Anemia in chronic kidney disease: Secondary | ICD-10-CM | POA: Diagnosis not present

## 2012-05-23 DIAGNOSIS — D509 Iron deficiency anemia, unspecified: Secondary | ICD-10-CM | POA: Diagnosis not present

## 2012-05-23 DIAGNOSIS — N186 End stage renal disease: Secondary | ICD-10-CM | POA: Diagnosis not present

## 2012-05-23 DIAGNOSIS — Z992 Dependence on renal dialysis: Secondary | ICD-10-CM | POA: Diagnosis not present

## 2012-06-11 DIAGNOSIS — M719 Bursopathy, unspecified: Secondary | ICD-10-CM | POA: Diagnosis not present

## 2012-06-11 DIAGNOSIS — E785 Hyperlipidemia, unspecified: Secondary | ICD-10-CM | POA: Diagnosis not present

## 2012-06-11 DIAGNOSIS — M67919 Unspecified disorder of synovium and tendon, unspecified shoulder: Secondary | ICD-10-CM | POA: Diagnosis not present

## 2012-06-23 DIAGNOSIS — N2581 Secondary hyperparathyroidism of renal origin: Secondary | ICD-10-CM | POA: Diagnosis not present

## 2012-06-23 DIAGNOSIS — N186 End stage renal disease: Secondary | ICD-10-CM | POA: Diagnosis not present

## 2012-06-23 DIAGNOSIS — D631 Anemia in chronic kidney disease: Secondary | ICD-10-CM | POA: Diagnosis not present

## 2012-06-23 DIAGNOSIS — D509 Iron deficiency anemia, unspecified: Secondary | ICD-10-CM | POA: Diagnosis not present

## 2012-07-18 DIAGNOSIS — N186 End stage renal disease: Secondary | ICD-10-CM | POA: Diagnosis not present

## 2012-07-21 DIAGNOSIS — N2581 Secondary hyperparathyroidism of renal origin: Secondary | ICD-10-CM | POA: Diagnosis not present

## 2012-07-21 DIAGNOSIS — D509 Iron deficiency anemia, unspecified: Secondary | ICD-10-CM | POA: Diagnosis not present

## 2012-07-21 DIAGNOSIS — D631 Anemia in chronic kidney disease: Secondary | ICD-10-CM | POA: Diagnosis not present

## 2012-07-21 DIAGNOSIS — Z23 Encounter for immunization: Secondary | ICD-10-CM | POA: Diagnosis not present

## 2012-07-21 DIAGNOSIS — N186 End stage renal disease: Secondary | ICD-10-CM | POA: Diagnosis not present

## 2012-08-18 DIAGNOSIS — N186 End stage renal disease: Secondary | ICD-10-CM | POA: Diagnosis not present

## 2012-08-20 DIAGNOSIS — N2581 Secondary hyperparathyroidism of renal origin: Secondary | ICD-10-CM | POA: Diagnosis not present

## 2012-08-20 DIAGNOSIS — D509 Iron deficiency anemia, unspecified: Secondary | ICD-10-CM | POA: Diagnosis not present

## 2012-08-20 DIAGNOSIS — E785 Hyperlipidemia, unspecified: Secondary | ICD-10-CM | POA: Diagnosis not present

## 2012-08-20 DIAGNOSIS — D631 Anemia in chronic kidney disease: Secondary | ICD-10-CM | POA: Diagnosis not present

## 2012-08-20 DIAGNOSIS — N186 End stage renal disease: Secondary | ICD-10-CM | POA: Diagnosis not present

## 2012-08-27 DIAGNOSIS — E785 Hyperlipidemia, unspecified: Secondary | ICD-10-CM | POA: Diagnosis not present

## 2012-09-17 DIAGNOSIS — N186 End stage renal disease: Secondary | ICD-10-CM | POA: Diagnosis not present

## 2012-09-19 DIAGNOSIS — N2581 Secondary hyperparathyroidism of renal origin: Secondary | ICD-10-CM | POA: Diagnosis not present

## 2012-09-19 DIAGNOSIS — N186 End stage renal disease: Secondary | ICD-10-CM | POA: Diagnosis not present

## 2012-09-19 DIAGNOSIS — D631 Anemia in chronic kidney disease: Secondary | ICD-10-CM | POA: Diagnosis not present

## 2012-09-19 DIAGNOSIS — D509 Iron deficiency anemia, unspecified: Secondary | ICD-10-CM | POA: Diagnosis not present

## 2012-10-18 DIAGNOSIS — N186 End stage renal disease: Secondary | ICD-10-CM | POA: Diagnosis not present

## 2012-10-20 DIAGNOSIS — D509 Iron deficiency anemia, unspecified: Secondary | ICD-10-CM | POA: Diagnosis not present

## 2012-10-20 DIAGNOSIS — D631 Anemia in chronic kidney disease: Secondary | ICD-10-CM | POA: Diagnosis not present

## 2012-10-20 DIAGNOSIS — N2581 Secondary hyperparathyroidism of renal origin: Secondary | ICD-10-CM | POA: Diagnosis not present

## 2012-10-20 DIAGNOSIS — N186 End stage renal disease: Secondary | ICD-10-CM | POA: Diagnosis not present

## 2012-11-17 DIAGNOSIS — N186 End stage renal disease: Secondary | ICD-10-CM | POA: Diagnosis not present

## 2012-11-19 DIAGNOSIS — E785 Hyperlipidemia, unspecified: Secondary | ICD-10-CM | POA: Diagnosis not present

## 2012-11-19 DIAGNOSIS — N2581 Secondary hyperparathyroidism of renal origin: Secondary | ICD-10-CM | POA: Diagnosis not present

## 2012-11-19 DIAGNOSIS — D509 Iron deficiency anemia, unspecified: Secondary | ICD-10-CM | POA: Diagnosis not present

## 2012-11-19 DIAGNOSIS — N186 End stage renal disease: Secondary | ICD-10-CM | POA: Diagnosis not present

## 2012-11-19 DIAGNOSIS — D631 Anemia in chronic kidney disease: Secondary | ICD-10-CM | POA: Diagnosis not present

## 2012-12-01 DIAGNOSIS — E785 Hyperlipidemia, unspecified: Secondary | ICD-10-CM | POA: Diagnosis not present

## 2012-12-18 DIAGNOSIS — N186 End stage renal disease: Secondary | ICD-10-CM | POA: Diagnosis not present

## 2012-12-19 DIAGNOSIS — N186 End stage renal disease: Secondary | ICD-10-CM | POA: Diagnosis not present

## 2012-12-19 DIAGNOSIS — D509 Iron deficiency anemia, unspecified: Secondary | ICD-10-CM | POA: Diagnosis not present

## 2012-12-19 DIAGNOSIS — D631 Anemia in chronic kidney disease: Secondary | ICD-10-CM | POA: Diagnosis not present

## 2012-12-19 DIAGNOSIS — N2581 Secondary hyperparathyroidism of renal origin: Secondary | ICD-10-CM | POA: Diagnosis not present

## 2013-01-18 DIAGNOSIS — N186 End stage renal disease: Secondary | ICD-10-CM | POA: Diagnosis not present

## 2013-01-19 DIAGNOSIS — N2581 Secondary hyperparathyroidism of renal origin: Secondary | ICD-10-CM | POA: Diagnosis not present

## 2013-01-19 DIAGNOSIS — D631 Anemia in chronic kidney disease: Secondary | ICD-10-CM | POA: Diagnosis not present

## 2013-01-19 DIAGNOSIS — N186 End stage renal disease: Secondary | ICD-10-CM | POA: Diagnosis not present

## 2013-01-19 DIAGNOSIS — D509 Iron deficiency anemia, unspecified: Secondary | ICD-10-CM | POA: Diagnosis not present

## 2013-02-02 ENCOUNTER — Encounter: Payer: Self-pay | Admitting: Vascular Surgery

## 2013-02-03 ENCOUNTER — Ambulatory Visit: Payer: Medicare Other | Admitting: Vascular Surgery

## 2013-02-10 ENCOUNTER — Ambulatory Visit: Payer: Medicare Other | Admitting: Vascular Surgery

## 2013-02-17 DIAGNOSIS — N186 End stage renal disease: Secondary | ICD-10-CM | POA: Diagnosis not present

## 2013-02-18 DIAGNOSIS — D631 Anemia in chronic kidney disease: Secondary | ICD-10-CM | POA: Diagnosis not present

## 2013-02-18 DIAGNOSIS — N186 End stage renal disease: Secondary | ICD-10-CM | POA: Diagnosis not present

## 2013-02-18 DIAGNOSIS — D509 Iron deficiency anemia, unspecified: Secondary | ICD-10-CM | POA: Diagnosis not present

## 2013-02-18 DIAGNOSIS — N2581 Secondary hyperparathyroidism of renal origin: Secondary | ICD-10-CM | POA: Diagnosis not present

## 2013-02-18 DIAGNOSIS — E785 Hyperlipidemia, unspecified: Secondary | ICD-10-CM | POA: Diagnosis not present

## 2013-02-25 DIAGNOSIS — E785 Hyperlipidemia, unspecified: Secondary | ICD-10-CM | POA: Diagnosis not present

## 2013-03-20 DIAGNOSIS — N186 End stage renal disease: Secondary | ICD-10-CM | POA: Diagnosis not present

## 2013-03-23 DIAGNOSIS — D631 Anemia in chronic kidney disease: Secondary | ICD-10-CM | POA: Diagnosis not present

## 2013-03-23 DIAGNOSIS — N2581 Secondary hyperparathyroidism of renal origin: Secondary | ICD-10-CM | POA: Diagnosis not present

## 2013-03-23 DIAGNOSIS — N186 End stage renal disease: Secondary | ICD-10-CM | POA: Diagnosis not present

## 2013-03-23 DIAGNOSIS — D509 Iron deficiency anemia, unspecified: Secondary | ICD-10-CM | POA: Diagnosis not present

## 2013-04-19 DIAGNOSIS — N186 End stage renal disease: Secondary | ICD-10-CM | POA: Diagnosis not present

## 2013-04-20 DIAGNOSIS — N2581 Secondary hyperparathyroidism of renal origin: Secondary | ICD-10-CM | POA: Diagnosis not present

## 2013-04-20 DIAGNOSIS — D631 Anemia in chronic kidney disease: Secondary | ICD-10-CM | POA: Diagnosis not present

## 2013-04-20 DIAGNOSIS — N186 End stage renal disease: Secondary | ICD-10-CM | POA: Diagnosis not present

## 2013-04-20 DIAGNOSIS — D509 Iron deficiency anemia, unspecified: Secondary | ICD-10-CM | POA: Diagnosis not present

## 2013-04-21 DIAGNOSIS — I871 Compression of vein: Secondary | ICD-10-CM | POA: Diagnosis not present

## 2013-04-21 DIAGNOSIS — T82898A Other specified complication of vascular prosthetic devices, implants and grafts, initial encounter: Secondary | ICD-10-CM | POA: Diagnosis not present

## 2013-04-21 DIAGNOSIS — N186 End stage renal disease: Secondary | ICD-10-CM | POA: Diagnosis not present

## 2013-05-20 DIAGNOSIS — N186 End stage renal disease: Secondary | ICD-10-CM | POA: Diagnosis not present

## 2013-05-22 DIAGNOSIS — N039 Chronic nephritic syndrome with unspecified morphologic changes: Secondary | ICD-10-CM | POA: Diagnosis not present

## 2013-05-22 DIAGNOSIS — D509 Iron deficiency anemia, unspecified: Secondary | ICD-10-CM | POA: Diagnosis not present

## 2013-05-22 DIAGNOSIS — N2581 Secondary hyperparathyroidism of renal origin: Secondary | ICD-10-CM | POA: Diagnosis not present

## 2013-05-22 DIAGNOSIS — D631 Anemia in chronic kidney disease: Secondary | ICD-10-CM | POA: Diagnosis not present

## 2013-05-22 DIAGNOSIS — N186 End stage renal disease: Secondary | ICD-10-CM | POA: Diagnosis not present

## 2013-06-11 DIAGNOSIS — N186 End stage renal disease: Secondary | ICD-10-CM | POA: Diagnosis not present

## 2013-06-20 DIAGNOSIS — N186 End stage renal disease: Secondary | ICD-10-CM | POA: Diagnosis not present

## 2013-06-22 DIAGNOSIS — N186 End stage renal disease: Secondary | ICD-10-CM | POA: Diagnosis not present

## 2013-06-22 DIAGNOSIS — N2581 Secondary hyperparathyroidism of renal origin: Secondary | ICD-10-CM | POA: Diagnosis not present

## 2013-06-22 DIAGNOSIS — N039 Chronic nephritic syndrome with unspecified morphologic changes: Secondary | ICD-10-CM | POA: Diagnosis not present

## 2013-06-22 DIAGNOSIS — D509 Iron deficiency anemia, unspecified: Secondary | ICD-10-CM | POA: Diagnosis not present

## 2013-06-22 DIAGNOSIS — D631 Anemia in chronic kidney disease: Secondary | ICD-10-CM | POA: Diagnosis not present

## 2013-06-25 DIAGNOSIS — I10 Essential (primary) hypertension: Secondary | ICD-10-CM | POA: Diagnosis not present

## 2013-06-25 DIAGNOSIS — Z01818 Encounter for other preprocedural examination: Secondary | ICD-10-CM | POA: Diagnosis not present

## 2013-07-18 DIAGNOSIS — N186 End stage renal disease: Secondary | ICD-10-CM | POA: Diagnosis not present

## 2013-07-20 DIAGNOSIS — N2581 Secondary hyperparathyroidism of renal origin: Secondary | ICD-10-CM | POA: Diagnosis not present

## 2013-07-20 DIAGNOSIS — D631 Anemia in chronic kidney disease: Secondary | ICD-10-CM | POA: Diagnosis not present

## 2013-07-20 DIAGNOSIS — N186 End stage renal disease: Secondary | ICD-10-CM | POA: Diagnosis not present

## 2013-07-20 DIAGNOSIS — D509 Iron deficiency anemia, unspecified: Secondary | ICD-10-CM | POA: Diagnosis not present

## 2013-08-18 DIAGNOSIS — N186 End stage renal disease: Secondary | ICD-10-CM | POA: Diagnosis not present

## 2013-08-19 DIAGNOSIS — R7309 Other abnormal glucose: Secondary | ICD-10-CM | POA: Diagnosis not present

## 2013-08-19 DIAGNOSIS — M329 Systemic lupus erythematosus, unspecified: Secondary | ICD-10-CM | POA: Diagnosis not present

## 2013-08-19 DIAGNOSIS — N186 End stage renal disease: Secondary | ICD-10-CM | POA: Diagnosis not present

## 2013-08-19 DIAGNOSIS — D631 Anemia in chronic kidney disease: Secondary | ICD-10-CM | POA: Diagnosis not present

## 2013-08-19 DIAGNOSIS — N039 Chronic nephritic syndrome with unspecified morphologic changes: Secondary | ICD-10-CM | POA: Diagnosis not present

## 2013-08-19 DIAGNOSIS — N2581 Secondary hyperparathyroidism of renal origin: Secondary | ICD-10-CM | POA: Diagnosis not present

## 2013-08-19 DIAGNOSIS — Z23 Encounter for immunization: Secondary | ICD-10-CM | POA: Diagnosis not present

## 2013-08-19 DIAGNOSIS — D509 Iron deficiency anemia, unspecified: Secondary | ICD-10-CM | POA: Diagnosis not present

## 2013-08-21 DIAGNOSIS — D509 Iron deficiency anemia, unspecified: Secondary | ICD-10-CM | POA: Diagnosis not present

## 2013-08-21 DIAGNOSIS — N2581 Secondary hyperparathyroidism of renal origin: Secondary | ICD-10-CM | POA: Diagnosis not present

## 2013-08-21 DIAGNOSIS — N186 End stage renal disease: Secondary | ICD-10-CM | POA: Diagnosis not present

## 2013-08-21 DIAGNOSIS — D631 Anemia in chronic kidney disease: Secondary | ICD-10-CM | POA: Diagnosis not present

## 2013-08-21 DIAGNOSIS — M329 Systemic lupus erythematosus, unspecified: Secondary | ICD-10-CM | POA: Diagnosis not present

## 2013-08-21 DIAGNOSIS — Z23 Encounter for immunization: Secondary | ICD-10-CM | POA: Diagnosis not present

## 2013-08-24 DIAGNOSIS — N039 Chronic nephritic syndrome with unspecified morphologic changes: Secondary | ICD-10-CM | POA: Diagnosis not present

## 2013-08-24 DIAGNOSIS — D509 Iron deficiency anemia, unspecified: Secondary | ICD-10-CM | POA: Diagnosis not present

## 2013-08-24 DIAGNOSIS — N186 End stage renal disease: Secondary | ICD-10-CM | POA: Diagnosis not present

## 2013-08-24 DIAGNOSIS — M329 Systemic lupus erythematosus, unspecified: Secondary | ICD-10-CM | POA: Diagnosis not present

## 2013-08-24 DIAGNOSIS — Z23 Encounter for immunization: Secondary | ICD-10-CM | POA: Diagnosis not present

## 2013-08-24 DIAGNOSIS — D631 Anemia in chronic kidney disease: Secondary | ICD-10-CM | POA: Diagnosis not present

## 2013-08-24 DIAGNOSIS — N2581 Secondary hyperparathyroidism of renal origin: Secondary | ICD-10-CM | POA: Diagnosis not present

## 2013-08-25 DIAGNOSIS — D649 Anemia, unspecified: Secondary | ICD-10-CM | POA: Diagnosis not present

## 2013-08-25 DIAGNOSIS — T82898A Other specified complication of vascular prosthetic devices, implants and grafts, initial encounter: Secondary | ICD-10-CM | POA: Diagnosis not present

## 2013-08-25 DIAGNOSIS — I10 Essential (primary) hypertension: Secondary | ICD-10-CM | POA: Diagnosis not present

## 2013-08-25 DIAGNOSIS — Z8673 Personal history of transient ischemic attack (TIA), and cerebral infarction without residual deficits: Secondary | ICD-10-CM | POA: Diagnosis not present

## 2013-08-25 DIAGNOSIS — M329 Systemic lupus erythematosus, unspecified: Secondary | ICD-10-CM | POA: Diagnosis not present

## 2013-08-25 DIAGNOSIS — K219 Gastro-esophageal reflux disease without esophagitis: Secondary | ICD-10-CM | POA: Diagnosis not present

## 2013-08-25 DIAGNOSIS — N186 End stage renal disease: Secondary | ICD-10-CM | POA: Diagnosis not present

## 2013-08-25 DIAGNOSIS — E785 Hyperlipidemia, unspecified: Secondary | ICD-10-CM | POA: Diagnosis not present

## 2013-08-25 DIAGNOSIS — I120 Hypertensive chronic kidney disease with stage 5 chronic kidney disease or end stage renal disease: Secondary | ICD-10-CM | POA: Diagnosis not present

## 2013-08-25 DIAGNOSIS — Z992 Dependence on renal dialysis: Secondary | ICD-10-CM | POA: Diagnosis not present

## 2013-08-26 DIAGNOSIS — Z23 Encounter for immunization: Secondary | ICD-10-CM | POA: Diagnosis not present

## 2013-08-26 DIAGNOSIS — N2581 Secondary hyperparathyroidism of renal origin: Secondary | ICD-10-CM | POA: Diagnosis not present

## 2013-08-26 DIAGNOSIS — D509 Iron deficiency anemia, unspecified: Secondary | ICD-10-CM | POA: Diagnosis not present

## 2013-08-26 DIAGNOSIS — M329 Systemic lupus erythematosus, unspecified: Secondary | ICD-10-CM | POA: Diagnosis not present

## 2013-08-26 DIAGNOSIS — N039 Chronic nephritic syndrome with unspecified morphologic changes: Secondary | ICD-10-CM | POA: Diagnosis not present

## 2013-08-26 DIAGNOSIS — N186 End stage renal disease: Secondary | ICD-10-CM | POA: Diagnosis not present

## 2013-08-26 DIAGNOSIS — D631 Anemia in chronic kidney disease: Secondary | ICD-10-CM | POA: Diagnosis not present

## 2013-08-28 DIAGNOSIS — D509 Iron deficiency anemia, unspecified: Secondary | ICD-10-CM | POA: Diagnosis not present

## 2013-08-28 DIAGNOSIS — Z23 Encounter for immunization: Secondary | ICD-10-CM | POA: Diagnosis not present

## 2013-08-28 DIAGNOSIS — N186 End stage renal disease: Secondary | ICD-10-CM | POA: Diagnosis not present

## 2013-08-28 DIAGNOSIS — D631 Anemia in chronic kidney disease: Secondary | ICD-10-CM | POA: Diagnosis not present

## 2013-08-28 DIAGNOSIS — M329 Systemic lupus erythematosus, unspecified: Secondary | ICD-10-CM | POA: Diagnosis not present

## 2013-08-28 DIAGNOSIS — N2581 Secondary hyperparathyroidism of renal origin: Secondary | ICD-10-CM | POA: Diagnosis not present

## 2013-08-31 DIAGNOSIS — N2581 Secondary hyperparathyroidism of renal origin: Secondary | ICD-10-CM | POA: Diagnosis not present

## 2013-08-31 DIAGNOSIS — M329 Systemic lupus erythematosus, unspecified: Secondary | ICD-10-CM | POA: Diagnosis not present

## 2013-08-31 DIAGNOSIS — D509 Iron deficiency anemia, unspecified: Secondary | ICD-10-CM | POA: Diagnosis not present

## 2013-08-31 DIAGNOSIS — D631 Anemia in chronic kidney disease: Secondary | ICD-10-CM | POA: Diagnosis not present

## 2013-08-31 DIAGNOSIS — N186 End stage renal disease: Secondary | ICD-10-CM | POA: Diagnosis not present

## 2013-08-31 DIAGNOSIS — Z23 Encounter for immunization: Secondary | ICD-10-CM | POA: Diagnosis not present

## 2013-09-02 DIAGNOSIS — N2581 Secondary hyperparathyroidism of renal origin: Secondary | ICD-10-CM | POA: Diagnosis not present

## 2013-09-02 DIAGNOSIS — M329 Systemic lupus erythematosus, unspecified: Secondary | ICD-10-CM | POA: Diagnosis not present

## 2013-09-02 DIAGNOSIS — D509 Iron deficiency anemia, unspecified: Secondary | ICD-10-CM | POA: Diagnosis not present

## 2013-09-02 DIAGNOSIS — D631 Anemia in chronic kidney disease: Secondary | ICD-10-CM | POA: Diagnosis not present

## 2013-09-02 DIAGNOSIS — Z23 Encounter for immunization: Secondary | ICD-10-CM | POA: Diagnosis not present

## 2013-09-02 DIAGNOSIS — N186 End stage renal disease: Secondary | ICD-10-CM | POA: Diagnosis not present

## 2013-09-04 DIAGNOSIS — Z23 Encounter for immunization: Secondary | ICD-10-CM | POA: Diagnosis not present

## 2013-09-04 DIAGNOSIS — N2581 Secondary hyperparathyroidism of renal origin: Secondary | ICD-10-CM | POA: Diagnosis not present

## 2013-09-04 DIAGNOSIS — M329 Systemic lupus erythematosus, unspecified: Secondary | ICD-10-CM | POA: Diagnosis not present

## 2013-09-04 DIAGNOSIS — N186 End stage renal disease: Secondary | ICD-10-CM | POA: Diagnosis not present

## 2013-09-04 DIAGNOSIS — D509 Iron deficiency anemia, unspecified: Secondary | ICD-10-CM | POA: Diagnosis not present

## 2013-09-04 DIAGNOSIS — D631 Anemia in chronic kidney disease: Secondary | ICD-10-CM | POA: Diagnosis not present

## 2013-09-07 DIAGNOSIS — M329 Systemic lupus erythematosus, unspecified: Secondary | ICD-10-CM | POA: Diagnosis not present

## 2013-09-07 DIAGNOSIS — D509 Iron deficiency anemia, unspecified: Secondary | ICD-10-CM | POA: Diagnosis not present

## 2013-09-07 DIAGNOSIS — N039 Chronic nephritic syndrome with unspecified morphologic changes: Secondary | ICD-10-CM | POA: Diagnosis not present

## 2013-09-07 DIAGNOSIS — N186 End stage renal disease: Secondary | ICD-10-CM | POA: Diagnosis not present

## 2013-09-07 DIAGNOSIS — D631 Anemia in chronic kidney disease: Secondary | ICD-10-CM | POA: Diagnosis not present

## 2013-09-07 DIAGNOSIS — N2581 Secondary hyperparathyroidism of renal origin: Secondary | ICD-10-CM | POA: Diagnosis not present

## 2013-09-07 DIAGNOSIS — Z23 Encounter for immunization: Secondary | ICD-10-CM | POA: Diagnosis not present

## 2013-09-09 DIAGNOSIS — D631 Anemia in chronic kidney disease: Secondary | ICD-10-CM | POA: Diagnosis not present

## 2013-09-09 DIAGNOSIS — N039 Chronic nephritic syndrome with unspecified morphologic changes: Secondary | ICD-10-CM | POA: Diagnosis not present

## 2013-09-09 DIAGNOSIS — N2581 Secondary hyperparathyroidism of renal origin: Secondary | ICD-10-CM | POA: Diagnosis not present

## 2013-09-09 DIAGNOSIS — M329 Systemic lupus erythematosus, unspecified: Secondary | ICD-10-CM | POA: Diagnosis not present

## 2013-09-09 DIAGNOSIS — D509 Iron deficiency anemia, unspecified: Secondary | ICD-10-CM | POA: Diagnosis not present

## 2013-09-09 DIAGNOSIS — N186 End stage renal disease: Secondary | ICD-10-CM | POA: Diagnosis not present

## 2013-09-09 DIAGNOSIS — Z23 Encounter for immunization: Secondary | ICD-10-CM | POA: Diagnosis not present

## 2013-09-11 DIAGNOSIS — N039 Chronic nephritic syndrome with unspecified morphologic changes: Secondary | ICD-10-CM | POA: Diagnosis not present

## 2013-09-11 DIAGNOSIS — N2581 Secondary hyperparathyroidism of renal origin: Secondary | ICD-10-CM | POA: Diagnosis not present

## 2013-09-11 DIAGNOSIS — D509 Iron deficiency anemia, unspecified: Secondary | ICD-10-CM | POA: Diagnosis not present

## 2013-09-11 DIAGNOSIS — D631 Anemia in chronic kidney disease: Secondary | ICD-10-CM | POA: Diagnosis not present

## 2013-09-11 DIAGNOSIS — Z23 Encounter for immunization: Secondary | ICD-10-CM | POA: Diagnosis not present

## 2013-09-11 DIAGNOSIS — M329 Systemic lupus erythematosus, unspecified: Secondary | ICD-10-CM | POA: Diagnosis not present

## 2013-09-11 DIAGNOSIS — N186 End stage renal disease: Secondary | ICD-10-CM | POA: Diagnosis not present

## 2013-09-14 DIAGNOSIS — N2581 Secondary hyperparathyroidism of renal origin: Secondary | ICD-10-CM | POA: Diagnosis not present

## 2013-09-14 DIAGNOSIS — Z23 Encounter for immunization: Secondary | ICD-10-CM | POA: Diagnosis not present

## 2013-09-14 DIAGNOSIS — D631 Anemia in chronic kidney disease: Secondary | ICD-10-CM | POA: Diagnosis not present

## 2013-09-14 DIAGNOSIS — N186 End stage renal disease: Secondary | ICD-10-CM | POA: Diagnosis not present

## 2013-09-14 DIAGNOSIS — D509 Iron deficiency anemia, unspecified: Secondary | ICD-10-CM | POA: Diagnosis not present

## 2013-09-14 DIAGNOSIS — M329 Systemic lupus erythematosus, unspecified: Secondary | ICD-10-CM | POA: Diagnosis not present

## 2013-09-15 ENCOUNTER — Other Ambulatory Visit (HOSPITAL_COMMUNITY): Payer: Self-pay | Admitting: Obstetrics

## 2013-09-15 DIAGNOSIS — Z113 Encounter for screening for infections with a predominantly sexual mode of transmission: Secondary | ICD-10-CM | POA: Diagnosis not present

## 2013-09-15 DIAGNOSIS — A57 Chancroid: Secondary | ICD-10-CM | POA: Diagnosis not present

## 2013-09-15 DIAGNOSIS — Z1231 Encounter for screening mammogram for malignant neoplasm of breast: Secondary | ICD-10-CM

## 2013-09-15 DIAGNOSIS — Z01419 Encounter for gynecological examination (general) (routine) without abnormal findings: Secondary | ICD-10-CM | POA: Diagnosis not present

## 2013-09-16 DIAGNOSIS — D631 Anemia in chronic kidney disease: Secondary | ICD-10-CM | POA: Diagnosis not present

## 2013-09-16 DIAGNOSIS — M329 Systemic lupus erythematosus, unspecified: Secondary | ICD-10-CM | POA: Diagnosis not present

## 2013-09-16 DIAGNOSIS — N186 End stage renal disease: Secondary | ICD-10-CM | POA: Diagnosis not present

## 2013-09-16 DIAGNOSIS — N039 Chronic nephritic syndrome with unspecified morphologic changes: Secondary | ICD-10-CM | POA: Diagnosis not present

## 2013-09-16 DIAGNOSIS — D509 Iron deficiency anemia, unspecified: Secondary | ICD-10-CM | POA: Diagnosis not present

## 2013-09-16 DIAGNOSIS — Z23 Encounter for immunization: Secondary | ICD-10-CM | POA: Diagnosis not present

## 2013-09-16 DIAGNOSIS — N2581 Secondary hyperparathyroidism of renal origin: Secondary | ICD-10-CM | POA: Diagnosis not present

## 2013-09-17 DIAGNOSIS — N186 End stage renal disease: Secondary | ICD-10-CM | POA: Diagnosis not present

## 2013-09-18 DIAGNOSIS — N2581 Secondary hyperparathyroidism of renal origin: Secondary | ICD-10-CM | POA: Diagnosis not present

## 2013-09-18 DIAGNOSIS — N186 End stage renal disease: Secondary | ICD-10-CM | POA: Diagnosis not present

## 2013-09-18 DIAGNOSIS — D509 Iron deficiency anemia, unspecified: Secondary | ICD-10-CM | POA: Diagnosis not present

## 2013-09-18 DIAGNOSIS — D631 Anemia in chronic kidney disease: Secondary | ICD-10-CM | POA: Diagnosis not present

## 2013-09-18 DIAGNOSIS — R7309 Other abnormal glucose: Secondary | ICD-10-CM | POA: Diagnosis not present

## 2013-09-18 DIAGNOSIS — M329 Systemic lupus erythematosus, unspecified: Secondary | ICD-10-CM | POA: Diagnosis not present

## 2013-09-22 ENCOUNTER — Ambulatory Visit (HOSPITAL_COMMUNITY)
Admission: RE | Admit: 2013-09-22 | Discharge: 2013-09-22 | Disposition: A | Discharge status: Home / Self Care | Payer: Medicare Other | Source: Ambulatory Visit | Attending: Obstetrics | Admitting: Obstetrics

## 2013-09-22 DIAGNOSIS — Z1231 Encounter for screening mammogram for malignant neoplasm of breast: Secondary | ICD-10-CM | POA: Diagnosis not present

## 2013-10-01 DIAGNOSIS — I739 Peripheral vascular disease, unspecified: Secondary | ICD-10-CM | POA: Diagnosis not present

## 2013-10-01 DIAGNOSIS — E785 Hyperlipidemia, unspecified: Secondary | ICD-10-CM | POA: Diagnosis not present

## 2013-10-01 DIAGNOSIS — Z0110 Encounter for examination of ears and hearing without abnormal findings: Secondary | ICD-10-CM | POA: Diagnosis not present

## 2013-10-01 DIAGNOSIS — N186 End stage renal disease: Secondary | ICD-10-CM | POA: Diagnosis not present

## 2013-10-01 DIAGNOSIS — Z0000 Encounter for general adult medical examination without abnormal findings: Secondary | ICD-10-CM | POA: Diagnosis not present

## 2013-10-01 DIAGNOSIS — I10 Essential (primary) hypertension: Secondary | ICD-10-CM | POA: Diagnosis not present

## 2013-10-01 DIAGNOSIS — K219 Gastro-esophageal reflux disease without esophagitis: Secondary | ICD-10-CM | POA: Diagnosis not present

## 2013-10-01 DIAGNOSIS — F17200 Nicotine dependence, unspecified, uncomplicated: Secondary | ICD-10-CM | POA: Diagnosis not present

## 2013-10-06 DIAGNOSIS — Z1211 Encounter for screening for malignant neoplasm of colon: Secondary | ICD-10-CM | POA: Diagnosis not present

## 2013-10-06 DIAGNOSIS — K219 Gastro-esophageal reflux disease without esophagitis: Secondary | ICD-10-CM | POA: Diagnosis not present

## 2013-10-18 DIAGNOSIS — N186 End stage renal disease: Secondary | ICD-10-CM | POA: Diagnosis not present

## 2013-10-19 DIAGNOSIS — M329 Systemic lupus erythematosus, unspecified: Secondary | ICD-10-CM | POA: Diagnosis not present

## 2013-10-19 DIAGNOSIS — N186 End stage renal disease: Secondary | ICD-10-CM | POA: Diagnosis not present

## 2013-10-19 DIAGNOSIS — D509 Iron deficiency anemia, unspecified: Secondary | ICD-10-CM | POA: Diagnosis not present

## 2013-10-19 DIAGNOSIS — R7309 Other abnormal glucose: Secondary | ICD-10-CM | POA: Diagnosis not present

## 2013-10-19 DIAGNOSIS — N2581 Secondary hyperparathyroidism of renal origin: Secondary | ICD-10-CM | POA: Diagnosis not present

## 2013-10-19 DIAGNOSIS — D631 Anemia in chronic kidney disease: Secondary | ICD-10-CM | POA: Diagnosis not present

## 2013-10-22 DIAGNOSIS — Z780 Asymptomatic menopausal state: Secondary | ICD-10-CM | POA: Diagnosis not present

## 2013-10-22 DIAGNOSIS — Z13820 Encounter for screening for osteoporosis: Secondary | ICD-10-CM | POA: Diagnosis not present

## 2013-11-16 ENCOUNTER — Encounter: Payer: Self-pay | Admitting: Internal Medicine

## 2013-11-17 DIAGNOSIS — N186 End stage renal disease: Secondary | ICD-10-CM | POA: Diagnosis not present

## 2013-11-18 DIAGNOSIS — D631 Anemia in chronic kidney disease: Secondary | ICD-10-CM | POA: Diagnosis not present

## 2013-11-18 DIAGNOSIS — R7309 Other abnormal glucose: Secondary | ICD-10-CM | POA: Diagnosis not present

## 2013-11-18 DIAGNOSIS — N186 End stage renal disease: Secondary | ICD-10-CM | POA: Diagnosis not present

## 2013-11-18 DIAGNOSIS — N039 Chronic nephritic syndrome with unspecified morphologic changes: Secondary | ICD-10-CM | POA: Diagnosis not present

## 2013-11-18 DIAGNOSIS — D509 Iron deficiency anemia, unspecified: Secondary | ICD-10-CM | POA: Diagnosis not present

## 2013-11-18 DIAGNOSIS — N2581 Secondary hyperparathyroidism of renal origin: Secondary | ICD-10-CM | POA: Diagnosis not present

## 2013-12-18 DIAGNOSIS — N186 End stage renal disease: Secondary | ICD-10-CM | POA: Diagnosis not present

## 2013-12-21 DIAGNOSIS — D631 Anemia in chronic kidney disease: Secondary | ICD-10-CM | POA: Diagnosis not present

## 2013-12-21 DIAGNOSIS — N2581 Secondary hyperparathyroidism of renal origin: Secondary | ICD-10-CM | POA: Diagnosis not present

## 2013-12-21 DIAGNOSIS — N186 End stage renal disease: Secondary | ICD-10-CM | POA: Diagnosis not present

## 2013-12-21 DIAGNOSIS — M329 Systemic lupus erythematosus, unspecified: Secondary | ICD-10-CM | POA: Diagnosis not present

## 2013-12-21 DIAGNOSIS — D509 Iron deficiency anemia, unspecified: Secondary | ICD-10-CM | POA: Diagnosis not present

## 2014-01-18 DIAGNOSIS — N186 End stage renal disease: Secondary | ICD-10-CM | POA: Diagnosis not present

## 2014-01-20 DIAGNOSIS — M329 Systemic lupus erythematosus, unspecified: Secondary | ICD-10-CM | POA: Diagnosis not present

## 2014-01-20 DIAGNOSIS — N2581 Secondary hyperparathyroidism of renal origin: Secondary | ICD-10-CM | POA: Diagnosis not present

## 2014-01-20 DIAGNOSIS — N186 End stage renal disease: Secondary | ICD-10-CM | POA: Diagnosis not present

## 2014-01-20 DIAGNOSIS — N039 Chronic nephritic syndrome with unspecified morphologic changes: Secondary | ICD-10-CM | POA: Diagnosis not present

## 2014-01-20 DIAGNOSIS — D631 Anemia in chronic kidney disease: Secondary | ICD-10-CM | POA: Diagnosis not present

## 2014-01-20 DIAGNOSIS — D509 Iron deficiency anemia, unspecified: Secondary | ICD-10-CM | POA: Diagnosis not present

## 2014-01-28 DIAGNOSIS — K648 Other hemorrhoids: Secondary | ICD-10-CM | POA: Diagnosis not present

## 2014-01-28 DIAGNOSIS — D126 Benign neoplasm of colon, unspecified: Secondary | ICD-10-CM | POA: Diagnosis not present

## 2014-01-28 DIAGNOSIS — K5730 Diverticulosis of large intestine without perforation or abscess without bleeding: Secondary | ICD-10-CM | POA: Diagnosis not present

## 2014-01-28 DIAGNOSIS — Z1211 Encounter for screening for malignant neoplasm of colon: Secondary | ICD-10-CM | POA: Diagnosis not present

## 2014-01-29 ENCOUNTER — Other Ambulatory Visit: Payer: Self-pay | Admitting: Gastroenterology

## 2014-01-29 DIAGNOSIS — D126 Benign neoplasm of colon, unspecified: Secondary | ICD-10-CM | POA: Diagnosis not present

## 2014-02-19 DIAGNOSIS — D509 Iron deficiency anemia, unspecified: Secondary | ICD-10-CM | POA: Diagnosis not present

## 2014-02-19 DIAGNOSIS — N2581 Secondary hyperparathyroidism of renal origin: Secondary | ICD-10-CM | POA: Diagnosis not present

## 2014-02-19 DIAGNOSIS — M3210 Systemic lupus erythematosus, organ or system involvement unspecified: Secondary | ICD-10-CM | POA: Diagnosis not present

## 2014-02-19 DIAGNOSIS — N186 End stage renal disease: Secondary | ICD-10-CM | POA: Diagnosis not present

## 2014-02-19 DIAGNOSIS — D631 Anemia in chronic kidney disease: Secondary | ICD-10-CM | POA: Diagnosis not present

## 2014-03-20 DIAGNOSIS — N186 End stage renal disease: Secondary | ICD-10-CM | POA: Diagnosis not present

## 2014-03-20 DIAGNOSIS — Z992 Dependence on renal dialysis: Secondary | ICD-10-CM | POA: Diagnosis not present

## 2014-03-22 DIAGNOSIS — M3210 Systemic lupus erythematosus, organ or system involvement unspecified: Secondary | ICD-10-CM | POA: Diagnosis not present

## 2014-03-22 DIAGNOSIS — N186 End stage renal disease: Secondary | ICD-10-CM | POA: Diagnosis not present

## 2014-03-22 DIAGNOSIS — D631 Anemia in chronic kidney disease: Secondary | ICD-10-CM | POA: Diagnosis not present

## 2014-03-22 DIAGNOSIS — N2581 Secondary hyperparathyroidism of renal origin: Secondary | ICD-10-CM | POA: Diagnosis not present

## 2014-03-22 DIAGNOSIS — D509 Iron deficiency anemia, unspecified: Secondary | ICD-10-CM | POA: Diagnosis not present

## 2014-04-19 DIAGNOSIS — N186 End stage renal disease: Secondary | ICD-10-CM | POA: Diagnosis not present

## 2014-04-19 DIAGNOSIS — Z992 Dependence on renal dialysis: Secondary | ICD-10-CM | POA: Diagnosis not present

## 2014-04-21 DIAGNOSIS — N186 End stage renal disease: Secondary | ICD-10-CM | POA: Diagnosis not present

## 2014-04-21 DIAGNOSIS — D631 Anemia in chronic kidney disease: Secondary | ICD-10-CM | POA: Diagnosis not present

## 2014-04-21 DIAGNOSIS — M3210 Systemic lupus erythematosus, organ or system involvement unspecified: Secondary | ICD-10-CM | POA: Diagnosis not present

## 2014-04-21 DIAGNOSIS — N2581 Secondary hyperparathyroidism of renal origin: Secondary | ICD-10-CM | POA: Diagnosis not present

## 2014-04-21 DIAGNOSIS — D509 Iron deficiency anemia, unspecified: Secondary | ICD-10-CM | POA: Diagnosis not present

## 2014-04-23 DIAGNOSIS — M3210 Systemic lupus erythematosus, organ or system involvement unspecified: Secondary | ICD-10-CM | POA: Diagnosis not present

## 2014-04-23 DIAGNOSIS — D631 Anemia in chronic kidney disease: Secondary | ICD-10-CM | POA: Diagnosis not present

## 2014-04-23 DIAGNOSIS — N2581 Secondary hyperparathyroidism of renal origin: Secondary | ICD-10-CM | POA: Diagnosis not present

## 2014-04-23 DIAGNOSIS — N186 End stage renal disease: Secondary | ICD-10-CM | POA: Diagnosis not present

## 2014-04-23 DIAGNOSIS — D509 Iron deficiency anemia, unspecified: Secondary | ICD-10-CM | POA: Diagnosis not present

## 2014-04-26 DIAGNOSIS — D509 Iron deficiency anemia, unspecified: Secondary | ICD-10-CM | POA: Diagnosis not present

## 2014-04-26 DIAGNOSIS — D631 Anemia in chronic kidney disease: Secondary | ICD-10-CM | POA: Diagnosis not present

## 2014-04-26 DIAGNOSIS — N186 End stage renal disease: Secondary | ICD-10-CM | POA: Diagnosis not present

## 2014-04-26 DIAGNOSIS — M3210 Systemic lupus erythematosus, organ or system involvement unspecified: Secondary | ICD-10-CM | POA: Diagnosis not present

## 2014-04-26 DIAGNOSIS — N2581 Secondary hyperparathyroidism of renal origin: Secondary | ICD-10-CM | POA: Diagnosis not present

## 2014-04-28 DIAGNOSIS — M3210 Systemic lupus erythematosus, organ or system involvement unspecified: Secondary | ICD-10-CM | POA: Diagnosis not present

## 2014-04-28 DIAGNOSIS — K219 Gastro-esophageal reflux disease without esophagitis: Secondary | ICD-10-CM | POA: Diagnosis not present

## 2014-04-28 DIAGNOSIS — N186 End stage renal disease: Secondary | ICD-10-CM | POA: Diagnosis not present

## 2014-04-28 DIAGNOSIS — D509 Iron deficiency anemia, unspecified: Secondary | ICD-10-CM | POA: Diagnosis not present

## 2014-04-28 DIAGNOSIS — D631 Anemia in chronic kidney disease: Secondary | ICD-10-CM | POA: Diagnosis not present

## 2014-04-28 DIAGNOSIS — N2581 Secondary hyperparathyroidism of renal origin: Secondary | ICD-10-CM | POA: Diagnosis not present

## 2014-04-30 DIAGNOSIS — M3210 Systemic lupus erythematosus, organ or system involvement unspecified: Secondary | ICD-10-CM | POA: Diagnosis not present

## 2014-04-30 DIAGNOSIS — D509 Iron deficiency anemia, unspecified: Secondary | ICD-10-CM | POA: Diagnosis not present

## 2014-04-30 DIAGNOSIS — D631 Anemia in chronic kidney disease: Secondary | ICD-10-CM | POA: Diagnosis not present

## 2014-04-30 DIAGNOSIS — N186 End stage renal disease: Secondary | ICD-10-CM | POA: Diagnosis not present

## 2014-04-30 DIAGNOSIS — N2581 Secondary hyperparathyroidism of renal origin: Secondary | ICD-10-CM | POA: Diagnosis not present

## 2014-05-03 DIAGNOSIS — D631 Anemia in chronic kidney disease: Secondary | ICD-10-CM | POA: Diagnosis not present

## 2014-05-03 DIAGNOSIS — M3210 Systemic lupus erythematosus, organ or system involvement unspecified: Secondary | ICD-10-CM | POA: Diagnosis not present

## 2014-05-03 DIAGNOSIS — N2581 Secondary hyperparathyroidism of renal origin: Secondary | ICD-10-CM | POA: Diagnosis not present

## 2014-05-03 DIAGNOSIS — D509 Iron deficiency anemia, unspecified: Secondary | ICD-10-CM | POA: Diagnosis not present

## 2014-05-03 DIAGNOSIS — N186 End stage renal disease: Secondary | ICD-10-CM | POA: Diagnosis not present

## 2014-05-05 DIAGNOSIS — N2581 Secondary hyperparathyroidism of renal origin: Secondary | ICD-10-CM | POA: Diagnosis not present

## 2014-05-05 DIAGNOSIS — M3210 Systemic lupus erythematosus, organ or system involvement unspecified: Secondary | ICD-10-CM | POA: Diagnosis not present

## 2014-05-05 DIAGNOSIS — D631 Anemia in chronic kidney disease: Secondary | ICD-10-CM | POA: Diagnosis not present

## 2014-05-05 DIAGNOSIS — N186 End stage renal disease: Secondary | ICD-10-CM | POA: Diagnosis not present

## 2014-05-05 DIAGNOSIS — D509 Iron deficiency anemia, unspecified: Secondary | ICD-10-CM | POA: Diagnosis not present

## 2014-05-07 DIAGNOSIS — N186 End stage renal disease: Secondary | ICD-10-CM | POA: Diagnosis not present

## 2014-05-07 DIAGNOSIS — N2581 Secondary hyperparathyroidism of renal origin: Secondary | ICD-10-CM | POA: Diagnosis not present

## 2014-05-07 DIAGNOSIS — M3210 Systemic lupus erythematosus, organ or system involvement unspecified: Secondary | ICD-10-CM | POA: Diagnosis not present

## 2014-05-07 DIAGNOSIS — D631 Anemia in chronic kidney disease: Secondary | ICD-10-CM | POA: Diagnosis not present

## 2014-05-07 DIAGNOSIS — D509 Iron deficiency anemia, unspecified: Secondary | ICD-10-CM | POA: Diagnosis not present

## 2014-05-10 DIAGNOSIS — M3210 Systemic lupus erythematosus, organ or system involvement unspecified: Secondary | ICD-10-CM | POA: Diagnosis not present

## 2014-05-10 DIAGNOSIS — N186 End stage renal disease: Secondary | ICD-10-CM | POA: Diagnosis not present

## 2014-05-10 DIAGNOSIS — D509 Iron deficiency anemia, unspecified: Secondary | ICD-10-CM | POA: Diagnosis not present

## 2014-05-10 DIAGNOSIS — D631 Anemia in chronic kidney disease: Secondary | ICD-10-CM | POA: Diagnosis not present

## 2014-05-10 DIAGNOSIS — N2581 Secondary hyperparathyroidism of renal origin: Secondary | ICD-10-CM | POA: Diagnosis not present

## 2014-05-12 DIAGNOSIS — M3210 Systemic lupus erythematosus, organ or system involvement unspecified: Secondary | ICD-10-CM | POA: Diagnosis not present

## 2014-05-12 DIAGNOSIS — D509 Iron deficiency anemia, unspecified: Secondary | ICD-10-CM | POA: Diagnosis not present

## 2014-05-12 DIAGNOSIS — N186 End stage renal disease: Secondary | ICD-10-CM | POA: Diagnosis not present

## 2014-05-12 DIAGNOSIS — D631 Anemia in chronic kidney disease: Secondary | ICD-10-CM | POA: Diagnosis not present

## 2014-05-12 DIAGNOSIS — N2581 Secondary hyperparathyroidism of renal origin: Secondary | ICD-10-CM | POA: Diagnosis not present

## 2014-05-15 DIAGNOSIS — D509 Iron deficiency anemia, unspecified: Secondary | ICD-10-CM | POA: Diagnosis not present

## 2014-05-15 DIAGNOSIS — M3210 Systemic lupus erythematosus, organ or system involvement unspecified: Secondary | ICD-10-CM | POA: Diagnosis not present

## 2014-05-15 DIAGNOSIS — D631 Anemia in chronic kidney disease: Secondary | ICD-10-CM | POA: Diagnosis not present

## 2014-05-15 DIAGNOSIS — N2581 Secondary hyperparathyroidism of renal origin: Secondary | ICD-10-CM | POA: Diagnosis not present

## 2014-05-15 DIAGNOSIS — N186 End stage renal disease: Secondary | ICD-10-CM | POA: Diagnosis not present

## 2014-05-17 DIAGNOSIS — D631 Anemia in chronic kidney disease: Secondary | ICD-10-CM | POA: Diagnosis not present

## 2014-05-17 DIAGNOSIS — M3210 Systemic lupus erythematosus, organ or system involvement unspecified: Secondary | ICD-10-CM | POA: Diagnosis not present

## 2014-05-17 DIAGNOSIS — N186 End stage renal disease: Secondary | ICD-10-CM | POA: Diagnosis not present

## 2014-05-17 DIAGNOSIS — D509 Iron deficiency anemia, unspecified: Secondary | ICD-10-CM | POA: Diagnosis not present

## 2014-05-17 DIAGNOSIS — N2581 Secondary hyperparathyroidism of renal origin: Secondary | ICD-10-CM | POA: Diagnosis not present

## 2014-05-19 DIAGNOSIS — D509 Iron deficiency anemia, unspecified: Secondary | ICD-10-CM | POA: Diagnosis not present

## 2014-05-19 DIAGNOSIS — D631 Anemia in chronic kidney disease: Secondary | ICD-10-CM | POA: Diagnosis not present

## 2014-05-19 DIAGNOSIS — N2581 Secondary hyperparathyroidism of renal origin: Secondary | ICD-10-CM | POA: Diagnosis not present

## 2014-05-19 DIAGNOSIS — M3210 Systemic lupus erythematosus, organ or system involvement unspecified: Secondary | ICD-10-CM | POA: Diagnosis not present

## 2014-05-19 DIAGNOSIS — N186 End stage renal disease: Secondary | ICD-10-CM | POA: Diagnosis not present

## 2014-05-20 DIAGNOSIS — Z992 Dependence on renal dialysis: Secondary | ICD-10-CM | POA: Diagnosis not present

## 2014-05-20 DIAGNOSIS — N186 End stage renal disease: Secondary | ICD-10-CM | POA: Diagnosis not present

## 2014-05-22 DIAGNOSIS — N2581 Secondary hyperparathyroidism of renal origin: Secondary | ICD-10-CM | POA: Diagnosis not present

## 2014-05-22 DIAGNOSIS — D509 Iron deficiency anemia, unspecified: Secondary | ICD-10-CM | POA: Diagnosis not present

## 2014-05-22 DIAGNOSIS — D631 Anemia in chronic kidney disease: Secondary | ICD-10-CM | POA: Diagnosis not present

## 2014-05-22 DIAGNOSIS — N186 End stage renal disease: Secondary | ICD-10-CM | POA: Diagnosis not present

## 2014-05-22 DIAGNOSIS — M3210 Systemic lupus erythematosus, organ or system involvement unspecified: Secondary | ICD-10-CM | POA: Diagnosis not present

## 2014-05-26 DIAGNOSIS — A599 Trichomoniasis, unspecified: Secondary | ICD-10-CM | POA: Diagnosis not present

## 2014-06-03 ENCOUNTER — Encounter (HOSPITAL_COMMUNITY): Payer: Self-pay | Admitting: Obstetrics

## 2014-06-20 DIAGNOSIS — Z992 Dependence on renal dialysis: Secondary | ICD-10-CM | POA: Diagnosis not present

## 2014-06-20 DIAGNOSIS — N186 End stage renal disease: Secondary | ICD-10-CM | POA: Diagnosis not present

## 2014-06-21 DIAGNOSIS — D509 Iron deficiency anemia, unspecified: Secondary | ICD-10-CM | POA: Diagnosis not present

## 2014-06-21 DIAGNOSIS — D631 Anemia in chronic kidney disease: Secondary | ICD-10-CM | POA: Diagnosis not present

## 2014-06-21 DIAGNOSIS — M3210 Systemic lupus erythematosus, organ or system involvement unspecified: Secondary | ICD-10-CM | POA: Diagnosis not present

## 2014-06-21 DIAGNOSIS — N2581 Secondary hyperparathyroidism of renal origin: Secondary | ICD-10-CM | POA: Diagnosis not present

## 2014-06-21 DIAGNOSIS — N186 End stage renal disease: Secondary | ICD-10-CM | POA: Diagnosis not present

## 2014-06-21 DIAGNOSIS — Z23 Encounter for immunization: Secondary | ICD-10-CM | POA: Diagnosis not present

## 2014-07-19 DIAGNOSIS — N186 End stage renal disease: Secondary | ICD-10-CM | POA: Diagnosis not present

## 2014-07-19 DIAGNOSIS — Z992 Dependence on renal dialysis: Secondary | ICD-10-CM | POA: Diagnosis not present

## 2014-07-21 DIAGNOSIS — D509 Iron deficiency anemia, unspecified: Secondary | ICD-10-CM | POA: Diagnosis not present

## 2014-07-21 DIAGNOSIS — N186 End stage renal disease: Secondary | ICD-10-CM | POA: Diagnosis not present

## 2014-07-21 DIAGNOSIS — N2581 Secondary hyperparathyroidism of renal origin: Secondary | ICD-10-CM | POA: Diagnosis not present

## 2014-07-21 DIAGNOSIS — M3210 Systemic lupus erythematosus, organ or system involvement unspecified: Secondary | ICD-10-CM | POA: Diagnosis not present

## 2014-07-21 DIAGNOSIS — D631 Anemia in chronic kidney disease: Secondary | ICD-10-CM | POA: Diagnosis not present

## 2014-07-25 ENCOUNTER — Emergency Department (HOSPITAL_COMMUNITY): Payer: Medicare Other

## 2014-07-25 ENCOUNTER — Inpatient Hospital Stay (HOSPITAL_COMMUNITY)
Admission: EM | Admit: 2014-07-25 | Discharge: 2014-08-01 | DRG: 189 | Disposition: A | Discharge status: Home / Self Care | Payer: Medicare Other | Attending: Internal Medicine | Admitting: Internal Medicine

## 2014-07-25 ENCOUNTER — Encounter (HOSPITAL_COMMUNITY): Payer: Self-pay

## 2014-07-25 DIAGNOSIS — N938 Other specified abnormal uterine and vaginal bleeding: Secondary | ICD-10-CM | POA: Diagnosis not present

## 2014-07-25 DIAGNOSIS — I050 Rheumatic mitral stenosis: Secondary | ICD-10-CM | POA: Diagnosis present

## 2014-07-25 DIAGNOSIS — F17200 Nicotine dependence, unspecified, uncomplicated: Secondary | ICD-10-CM | POA: Diagnosis not present

## 2014-07-25 DIAGNOSIS — I248 Other forms of acute ischemic heart disease: Secondary | ICD-10-CM | POA: Diagnosis present

## 2014-07-25 DIAGNOSIS — R195 Other fecal abnormalities: Secondary | ICD-10-CM | POA: Diagnosis not present

## 2014-07-25 DIAGNOSIS — N2581 Secondary hyperparathyroidism of renal origin: Secondary | ICD-10-CM | POA: Diagnosis present

## 2014-07-25 DIAGNOSIS — K2970 Gastritis, unspecified, without bleeding: Secondary | ICD-10-CM | POA: Diagnosis not present

## 2014-07-25 DIAGNOSIS — Z992 Dependence on renal dialysis: Secondary | ICD-10-CM | POA: Diagnosis not present

## 2014-07-25 DIAGNOSIS — K529 Noninfective gastroenteritis and colitis, unspecified: Secondary | ICD-10-CM | POA: Diagnosis present

## 2014-07-25 DIAGNOSIS — I272 Other secondary pulmonary hypertension: Secondary | ICD-10-CM | POA: Diagnosis present

## 2014-07-25 DIAGNOSIS — I342 Nonrheumatic mitral (valve) stenosis: Secondary | ICD-10-CM | POA: Diagnosis not present

## 2014-07-25 DIAGNOSIS — I160 Hypertensive urgency: Secondary | ICD-10-CM

## 2014-07-25 DIAGNOSIS — R0602 Shortness of breath: Secondary | ICD-10-CM

## 2014-07-25 DIAGNOSIS — R7881 Bacteremia: Secondary | ICD-10-CM | POA: Diagnosis present

## 2014-07-25 DIAGNOSIS — I10 Essential (primary) hypertension: Secondary | ICD-10-CM | POA: Diagnosis not present

## 2014-07-25 DIAGNOSIS — N186 End stage renal disease: Secondary | ICD-10-CM

## 2014-07-25 DIAGNOSIS — J9601 Acute respiratory failure with hypoxia: Secondary | ICD-10-CM | POA: Diagnosis present

## 2014-07-25 DIAGNOSIS — E8770 Fluid overload, unspecified: Secondary | ICD-10-CM | POA: Diagnosis not present

## 2014-07-25 DIAGNOSIS — Z8709 Personal history of other diseases of the respiratory system: Secondary | ICD-10-CM | POA: Diagnosis present

## 2014-07-25 DIAGNOSIS — M329 Systemic lupus erythematosus, unspecified: Secondary | ICD-10-CM | POA: Diagnosis present

## 2014-07-25 DIAGNOSIS — J811 Chronic pulmonary edema: Secondary | ICD-10-CM | POA: Diagnosis present

## 2014-07-25 DIAGNOSIS — K449 Diaphragmatic hernia without obstruction or gangrene: Secondary | ICD-10-CM | POA: Diagnosis present

## 2014-07-25 DIAGNOSIS — J09X1 Influenza due to identified novel influenza A virus with pneumonia: Secondary | ICD-10-CM | POA: Diagnosis present

## 2014-07-25 DIAGNOSIS — J810 Acute pulmonary edema: Secondary | ICD-10-CM | POA: Diagnosis not present

## 2014-07-25 DIAGNOSIS — Z23 Encounter for immunization: Secondary | ICD-10-CM | POA: Diagnosis not present

## 2014-07-25 DIAGNOSIS — E871 Hypo-osmolality and hyponatremia: Secondary | ICD-10-CM | POA: Diagnosis present

## 2014-07-25 DIAGNOSIS — K279 Peptic ulcer, site unspecified, unspecified as acute or chronic, without hemorrhage or perforation: Secondary | ICD-10-CM | POA: Diagnosis not present

## 2014-07-25 DIAGNOSIS — F17210 Nicotine dependence, cigarettes, uncomplicated: Secondary | ICD-10-CM | POA: Diagnosis present

## 2014-07-25 DIAGNOSIS — Z79899 Other long term (current) drug therapy: Secondary | ICD-10-CM

## 2014-07-25 DIAGNOSIS — D649 Anemia, unspecified: Secondary | ICD-10-CM | POA: Diagnosis present

## 2014-07-25 DIAGNOSIS — I361 Nonrheumatic tricuspid (valve) insufficiency: Secondary | ICD-10-CM | POA: Diagnosis not present

## 2014-07-25 DIAGNOSIS — Z8673 Personal history of transient ischemic attack (TIA), and cerebral infarction without residual deficits: Secondary | ICD-10-CM | POA: Diagnosis present

## 2014-07-25 DIAGNOSIS — I639 Cerebral infarction, unspecified: Secondary | ICD-10-CM | POA: Diagnosis not present

## 2014-07-25 DIAGNOSIS — I120 Hypertensive chronic kidney disease with stage 5 chronic kidney disease or end stage renal disease: Secondary | ICD-10-CM | POA: Diagnosis present

## 2014-07-25 DIAGNOSIS — D62 Acute posthemorrhagic anemia: Secondary | ICD-10-CM | POA: Diagnosis not present

## 2014-07-25 DIAGNOSIS — J90 Pleural effusion, not elsewhere classified: Secondary | ICD-10-CM | POA: Diagnosis not present

## 2014-07-25 DIAGNOSIS — K219 Gastro-esophageal reflux disease without esophagitis: Secondary | ICD-10-CM | POA: Diagnosis present

## 2014-07-25 LAB — COMPREHENSIVE METABOLIC PANEL
ALBUMIN: 3.5 g/dL (ref 3.5–5.2)
ALK PHOS: 106 U/L (ref 39–117)
ALT: 19 U/L (ref 0–35)
AST: 33 U/L (ref 0–37)
Anion gap: 12 (ref 5–15)
BUN: 38 mg/dL — ABNORMAL HIGH (ref 6–23)
CO2: 24 mmol/L (ref 19–32)
CREATININE: 8.34 mg/dL — AB (ref 0.50–1.10)
Calcium: 9.2 mg/dL (ref 8.4–10.5)
Chloride: 95 mmol/L — ABNORMAL LOW (ref 96–112)
GFR, EST AFRICAN AMERICAN: 6 mL/min — AB (ref >90)
GFR, EST NON AFRICAN AMERICAN: 5 mL/min — AB (ref >90)
GLUCOSE: 128 mg/dL — AB (ref 70–99)
POTASSIUM: 4.6 mmol/L (ref 3.5–5.1)
Sodium: 131 mmol/L — ABNORMAL LOW (ref 135–145)
TOTAL PROTEIN: 8 g/dL (ref 6.0–8.3)
Total Bilirubin: 0.9 mg/dL (ref 0.3–1.2)

## 2014-07-25 LAB — CBC WITH DIFFERENTIAL/PLATELET
BASOS PCT: 0 % (ref 0–1)
Basophils Absolute: 0 10*3/uL (ref 0.0–0.1)
EOS PCT: 0 % (ref 0–5)
Eosinophils Absolute: 0 10*3/uL (ref 0.0–0.7)
HEMATOCRIT: 36.3 % (ref 36.0–46.0)
Hemoglobin: 11.5 g/dL — ABNORMAL LOW (ref 12.0–15.0)
Lymphocytes Relative: 10 % — ABNORMAL LOW (ref 12–46)
Lymphs Abs: 1.2 10*3/uL (ref 0.7–4.0)
MCH: 30.2 pg (ref 26.0–34.0)
MCHC: 31.7 g/dL (ref 30.0–36.0)
MCV: 95.3 fL (ref 78.0–100.0)
Monocytes Absolute: 0.4 10*3/uL (ref 0.1–1.0)
Monocytes Relative: 4 % (ref 3–12)
NEUTROS ABS: 11 10*3/uL — AB (ref 1.7–7.7)
NEUTROS PCT: 86 % — AB (ref 43–77)
PLATELETS: 201 10*3/uL (ref 150–400)
RBC: 3.81 MIL/uL — ABNORMAL LOW (ref 3.87–5.11)
RDW: 15.4 % (ref 11.5–15.5)
WBC: 12.7 10*3/uL — AB (ref 4.0–10.5)

## 2014-07-25 LAB — BRAIN NATRIURETIC PEPTIDE: B NATRIURETIC PEPTIDE 5: 2460.9 pg/mL — AB (ref 0.0–100.0)

## 2014-07-25 LAB — I-STAT TROPONIN, ED: Troponin i, poc: 0.05 ng/mL (ref 0.00–0.08)

## 2014-07-25 LAB — TROPONIN I: Troponin I: 0.06 ng/mL — ABNORMAL HIGH (ref <0.031)

## 2014-07-25 LAB — PROTIME-INR
INR: 1.05 (ref 0.00–1.49)
PROTHROMBIN TIME: 13.8 s (ref 11.6–15.2)

## 2014-07-25 LAB — PHOSPHORUS: Phosphorus: 6.3 mg/dL — ABNORMAL HIGH (ref 2.3–4.6)

## 2014-07-25 LAB — MAGNESIUM: MAGNESIUM: 2 mg/dL (ref 1.5–2.5)

## 2014-07-25 MED ORDER — SODIUM CHLORIDE 0.9 % IJ SOLN
3.0000 mL | Freq: Two times a day (BID) | INTRAMUSCULAR | Status: DC
  Administered 2014-07-26 – 2014-08-01 (×13): 3 mL via INTRAVENOUS

## 2014-07-25 MED ORDER — DEXTROSE 5 % IV SOLN
500.0000 mg | INTRAVENOUS | Status: DC
  Administered 2014-07-26: 500 mg via INTRAVENOUS
  Filled 2014-07-25 (×2): qty 500

## 2014-07-25 MED ORDER — SODIUM CHLORIDE 0.9 % IV SOLN: 250.0000 mL | INTRAVENOUS | Status: DC | PRN

## 2014-07-25 MED ORDER — HEPARIN SODIUM (PORCINE) 5000 UNIT/ML IJ SOLN
5000.0000 [IU] | Freq: Three times a day (TID) | INTRAMUSCULAR | Status: DC
  Administered 2014-07-26 – 2014-07-28 (×8): 5000 [IU] via SUBCUTANEOUS
  Filled 2014-07-25 (×11): qty 1

## 2014-07-25 MED ORDER — DEXTROSE 5 % IV SOLN
1.0000 g | INTRAVENOUS | Status: DC
  Administered 2014-07-26: 1 g via INTRAVENOUS
  Filled 2014-07-25 (×2): qty 10

## 2014-07-25 MED ORDER — PIPERACILLIN-TAZOBACTAM 3.375 G IVPB 30 MIN
3.3750 g | Freq: Once | INTRAVENOUS | Status: AC
  Administered 2014-07-25: 3.375 g via INTRAVENOUS
  Filled 2014-07-25: qty 50

## 2014-07-25 MED ORDER — ACETAMINOPHEN 500 MG PO TABS
1000.0000 mg | ORAL_TABLET | Freq: Once | ORAL | Status: AC
  Administered 2014-07-25: 1000 mg via ORAL
  Filled 2014-07-25: qty 2

## 2014-07-25 MED ORDER — VANCOMYCIN HCL 10 G IV SOLR
20.0000 mg/kg | Freq: Once | INTRAVENOUS | Status: AC
  Administered 2014-07-25: 1500 mg via INTRAVENOUS
  Filled 2014-07-25: qty 1500

## 2014-07-25 MED ORDER — SODIUM CHLORIDE 0.9 % IJ SOLN: 3.0000 mL | INTRAMUSCULAR | Status: DC | PRN

## 2014-07-25 MED ORDER — NITROGLYCERIN IN D5W 200-5 MCG/ML-% IV SOLN
0.0000 ug/min | Freq: Once | INTRAVENOUS | Status: AC
  Administered 2014-07-25: 10 ug/min via INTRAVENOUS
  Filled 2014-07-25: qty 250

## 2014-07-25 MED ORDER — METHYLPREDNISOLONE SODIUM SUCC 125 MG IJ SOLR
60.0000 mg | INTRAMUSCULAR | Status: DC
  Administered 2014-07-26: 60 mg via INTRAVENOUS
  Filled 2014-07-25: qty 2

## 2014-07-25 NOTE — H&P (Signed)
[facility] History and Physical  [name] [id number] DOB: July 05, 1960 DOA: 07/25/2014  Referring physician: N/A PCP: No primary care provider on file.  Specialists: N/A  Chief Complaint: Acute SOB  HPI: [name] is a 53 y.o. BF PMHx  stroke 2008, HTN, ESRD on HD M/W/F Lupus, DUB, anemia, PUD. Presents with cold symptoms including nonproductive cough that have been ongoing for the last 2 days, acute SOB.[name] Has not missed her HD session. States base weight 64 kg postdialysis. Patient says he had an echocardiogram 6 months ago at [facility]. PCP is a Dr. Sharene Butters at [place]. States only Lupus symptoms have been her renal failure.   Review of Systems: The patient denies anorexia, weight loss,, vision loss, decreased hearing, hoarseness, syncope, peripheral edema, balance deficits, hemoptysis, abdominal pain, melena, hematochezia, severe indigestion/heartburn, hematuria, incontinence, genital sores, muscle weakness, suspicious skin lesions, transient blindness, difficulty walking, depression, unusual weight change, abnormal bleeding, enlarged lymph nodes, angioedema, and breast masses.    TRAVEL HISTORY: NA   Consultants:  Dr. Windy Canny (nephrology) called by ED    Procedure/Significant Events:  3/6 PCXR;-Vascular congestion and pulmonary edema. - Bibasilar opacitiesl, Lt >>Rt.   vascular in etiology vs atelectasis vs pneumonia. - Lt pleural effusion.    Culture  3/6 Legionella urine antigen pending 3/6 strep pneumo urine antigen pending 3/6 blood pending  3/6 respiratory virus panel pending 3/6 flu panel pending   Antibiotics:  Zosyn 3/61 dose Vancomycin 3/61 dose Ceftriaxone 3/7>> Azithromycin 3/7>>   DVT prophylaxis:  Subcutaneous heparin   Devices     LINES / TUBES:     Past Medical History  Diagnosis Date  . Hypertension   . Blood transfusion '08    [facility]  . GERD (gastroesophageal reflux disease)   . Chronic kidney disease      dialysis   Mon Wed Fri  . Lupus   . Dysfunctional uterine bleeding 12/19/2010  . Anemia   . Peptic ulcer disease   . Hx of hiatal hernia    Past Surgical History  Procedure Laterality Date  . Dialysis fistula creation  2007   Social History:  reports that she has been smoking.  She does not have any smokeless tobacco history on file. She reports that she drinks alcohol. She reports that she uses illicit drugs (Marijuana).   No Known Allergies  History reviewed. No pertinent family history.   Prior to Admission medications   Medication Sig Start Date End Date Taking? Authorizing Provider  amLODipine (NORVASC) 10 MG tablet Take 10 mg by mouth daily.      Historical Provider, MD  b complex-vitamin c-folic acid (NEPHRO-VITE) 0.8 MG TABS tablet Take 0.8 mg by mouth at bedtime.    Historical Provider, MD  calcium acetate (PHOSLO) 667 MG capsule Take 667 mg by mouth 2 (two) times daily.      Historical Provider, MD  labetalol (NORMODYNE) 300 MG tablet Take 300 mg by mouth daily.      Historical Provider, MD  lisinopril (PRINIVIL,ZESTRIL) 20 MG tablet Take 20 mg by mouth daily.    Historical Provider, MD  promethazine (PHENERGAN) 25 MG tablet Take 25 mg by mouth every 6 (six) hours as needed for nausea.    Historical Provider, MD  ranitidine (ZANTAC) 150 MG tablet Take 150 mg by mouth 2 (two) times daily.      Historical Provider, MD  simvastatin (ZOCOR) 40 MG tablet Take 40 mg by mouth every evening.    Historical Provider, MD  Physical Exam: Filed Vitals:   07/25/14 2245 07/25/14 2300 07/25/14 2315 07/25/14 2330  BP: 207/119 200/119 192/115 176/99  Pulse: 116 112 109 107  Temp:      TempSrc:      Resp: 24 32 32 27  SpO2: 100% 99% 98% 97%     General:  A/O 4, acute respiratory distress on BiPAP  Eyes: He was equal round reactive to light and accommodation  Neck: Positive JVD just below the jawline, negative lymphadenopathy  Cardiovascular: Tachycardic, regular rhythm,  negative murmurs rubs gallops, normal S1/S2  Respiratory: Decreased breath sounds left lower lobe, rhonchi remainder left lobe with crackles apex, right lung field positive wheezing inspiratory  Abdomen: Soft, nontender, nondistended, plus bowel sounds  Skin: Negative rash visible  Musculoskeletal: Negative joint pain with active/passive movement  Neurologic: Within normal limit  Labs on Admission:  Basic Metabolic Panel:  Recent Labs Lab 07/25/14 2140  NA 131*  K 4.6  CL 95*  CO2 24  GLUCOSE 128*  BUN 38*  CREATININE 8.34*  CALCIUM 9.2  MG 2.0  PHOS 6.3*   Liver Function Tests:  Recent Labs Lab 07/25/14 2140  AST 33  ALT 19  ALKPHOS 106  BILITOT 0.9  PROT 8.0  ALBUMIN 3.5   No results for input(s): LIPASE, AMYLASE in the last 168 hours. No results for input(s): AMMONIA in the last 168 hours. CBC:  Recent Labs Lab 07/25/14 2140  WBC 12.7*  NEUTROABS 11.0*  HGB 11.5*  HCT 36.3  MCV 95.3  PLT 201   Cardiac Enzymes:  Recent Labs Lab 07/25/14 2140  TROPONINI 0.06*    BNP (last 3 results) No results for input(s): BNP in the last 8760 hours.  ProBNP (last 3 results) No results for input(s): PROBNP in the last 8760 hours.  CBG: No results for input(s): GLUCAP in the last 168 hours.  Radiological Exams on Admission: Dg Chest Port 1 View  07/25/2014   CLINICAL DATA:  Shortness of breath.  EXAM: PORTABLE CHEST - 1 VIEW  COMPARISON:  Most recent chest radiographs 01/30/2006  FINDINGS: The heart is at the upper limits of normal in size. There is vascular congestion and pulmonary edema. Bilateral patchy lower lobe airspace opacities, left greater than right. Probable left pleural effusion. Heart appears at the upper limits of normal in size. There is no pneumothorax. No acute osseous abnormalities are seen.  IMPRESSION: 1. Vascular congestion and pulmonary edema. Findings suggest fluid overload/CHF. 2. Bibasilar opacities, left greater than right. This may  be vascular in etiology versus atelectasis or pneumonia. There is a probable left pleural effusion.   Electronically Signed   By: Jeb Levering M.D.   On: 07/25/2014 21:56    EKG: Sinus tachycardia  Assessment/Plan Principal Problem:   Flash pulmonary edema Active Problems:   End-stage renal disease on hemodialysis   Hypertensive urgency   Lupus (systemic lupus erythematosus)   DUB (dysfunctional uterine bleeding)   Anemia   Peptic ulcer disease   Stroke   Flash pulmonary edema -Admission weight;?? -MI vs lupus flare? -Dr. Windy Canny (nephrology) to provide emergent hemodialysis -Obtain echocardiogram   CAP? -Continue empiric antibiotic -Sided Medrol 60 mg daily  Hypertensive urgency -Continue nitro drip -Emergent hemodialysis - Metoprolol 5 mg QID (at home labetalol 300 mg daily) -Will hold home dose of lisinopril until after dialysis  Elevated troponin -Demand ischemia vs true MI  -Trend troponin; if continues to increase will consult cardiology -Obtain echocardiogram   End-stage renal disease on hemodialysis  M/W/F -Currently receiving emergent hemodialysis; HD per nephrology  Hyponatremia -Most likely secondary to her ESRD, however will check patient for CHF secondary to lupus -  Fever  -Pneumonia  Vs lupus flare  -See CAP  LupusFlare? -See CAP -Once patient able to take PO medication would start NSAIDs -Continue steroids  Anemia -Anemia workup pending  DUB    Code Status: Full Family Communication: Mother Disposition Plan: Resolution of pulmonary edema/PNA   Time spent: 14 minutes  Allie Bossier [facility] Pager [phone]  If 7PM-7AM, please contact night-coverage [email or web address] Password [facility] 07/25/2014, 11:38 PM

## 2014-07-25 NOTE — ED Notes (Signed)
RT at bedside placing bipap

## 2014-07-25 NOTE — ED Provider Notes (Signed)
CSN: [id number]     Arrival date & time 07/25/14  2110 History   First MD Initiated Contact with Patient 07/25/14 2129     Chief Complaint  Patient presents with  . Shortness of Breath     (Consider location/radiation/quality/duration/timing/severity/associated sxs/prior Treatment) Patient is a 54 y.o. female presenting with shortness of breath. The history is provided by the patient. No language interpreter was used.  Shortness of Breath Severity:  Severe Onset quality:  Gradual Duration:  2 days Timing:  Constant Progression:  Worsening Chronicity:  New Context: not activity, not animal exposure, not emotional upset, not fumes, not known allergens, not occupational exposure, not pollens, not smoke exposure, not strong odors, not URI and not weather changes   Relieved by:  Nothing Worsened by:  Nothing tried Ineffective treatments:  None tried Associated symptoms: cough and fever   Associated symptoms: no abdominal pain, no chest pain, no diaphoresis, no headaches, no hemoptysis, no neck pain, no rash, no sputum production, no syncope, no vomiting and no wheezing   Cough:    Cough characteristics:  Non-productive   Severity:  Moderate   Onset quality:  Gradual   Duration:  2 days   Timing:  Intermittent   Progression:  Unchanged   Chronicity:  New Fever:    Duration:  2 days   Timing:  Intermittent   Temp source:  Subjective   Progression:  Waxing and waning Risk factors: no recent alcohol use, no family hx of DVT, no hx of cancer, no hx of PE/DVT, no obesity, no oral contraceptive use, no prolonged immobilization, no recent surgery and no tobacco use     Past Medical History  Diagnosis Date  . Hypertension   . Blood transfusion '08    [facility]  . GERD (gastroesophageal reflux disease)   . Chronic kidney disease     dialysis   Mon Wed Fri  . Lupus   . Dysfunctional uterine bleeding 12/19/2010  . Anemia   . Peptic ulcer disease   . Hx of hiatal hernia    Past Surgical  History  Procedure Laterality Date  . Dialysis fistula creation  2007   History reviewed. No pertinent family history. History  Substance Use Topics  . Smoking status: Current Some Day Smoker  . Smokeless tobacco: Not on file  . Alcohol Use: Yes   OB History    No data available     Review of Systems  Constitutional: Positive for fever and chills. Negative for diaphoresis.  Respiratory: Positive for cough and shortness of breath. Negative for hemoptysis, sputum production and wheezing.   Cardiovascular: Negative for chest pain and syncope.  Gastrointestinal: Positive for nausea and diarrhea. Negative for vomiting and abdominal pain.  Genitourinary: Negative for dysuria.  Musculoskeletal: Positive for myalgias. Negative for neck pain.  Skin: Negative for rash.  Neurological: Negative for dizziness, weakness, light-headedness, numbness and headaches.  Hematological: Negative for adenopathy. Does not bruise/bleed easily.  All other systems reviewed and are negative.     Allergies  Review of patient's allergies indicates no known allergies.  Home Medications   Prior to Admission medications   Medication Sig Start Date End Date Taking? Authorizing Provider  amLODipine (NORVASC) 10 MG tablet Take 10 mg by mouth daily.      Historical Provider, MD  b complex-vitamin c-folic acid (NEPHRO-VITE) 0.8 MG TABS tablet Take 0.8 mg by mouth at bedtime.    Historical Provider, MD  calcium acetate (PHOSLO) 667 MG capsule Take 667 mg  by mouth 2 (two) times daily.      Historical Provider, MD  labetalol (NORMODYNE) 300 MG tablet Take 300 mg by mouth daily.      Historical Provider, MD  lisinopril (PRINIVIL,ZESTRIL) 20 MG tablet Take 20 mg by mouth daily.    Historical Provider, MD  promethazine (PHENERGAN) 25 MG tablet Take 25 mg by mouth every 6 (six) hours as needed for nausea.    Historical Provider, MD  ranitidine (ZANTAC) 150 MG tablet Take 150 mg by mouth 2 (two) times daily.       Historical Provider, MD  simvastatin (ZOCOR) 40 MG tablet Take 40 mg by mouth every evening.    Historical Provider, MD   BP 220/100 mmHg  Pulse 122  Temp(Src) 99.1 F (37.3 C) (Oral)  Resp 36  SpO2 93%  LMP 12/02/2010 Physical Exam  Constitutional: She is oriented to person, place, and time. She appears distressed.  HENT:  Head: Normocephalic and atraumatic.  Right Ear: External ear normal.  Left Ear: External ear normal.  Mouth/Throat: Oropharynx is clear and moist.  Eyes: Conjunctivae and EOM are normal. Pupils are equal, round, and reactive to light.  Neck: Normal range of motion. Neck supple.  Cardiovascular: Regular rhythm, normal heart sounds and intact distal pulses.   Tachycardia   Pulmonary/Chest: She is in respiratory distress. She has no wheezes. She has rales. She exhibits no tenderness.  Abdominal: Soft. Bowel sounds are normal. She exhibits no distension and no mass. There is no tenderness. There is no rebound and no guarding.  Musculoskeletal: Normal range of motion.  Neurological: She is alert and oriented to person, place, and time.  Skin: Skin is warm and dry.  Nursing note and vitals reviewed.   ED Course  Procedures (including critical care time) Labs Review Labs Reviewed  CBC WITH DIFFERENTIAL/PLATELET - Abnormal; Notable for the following:    WBC 12.7 (*)    RBC 3.81 (*)    Hemoglobin 11.5 (*)    Neutrophils Relative % 86 (*)    Neutro Abs 11.0 (*)    Lymphocytes Relative 10 (*)    All other components within normal limits  COMPREHENSIVE METABOLIC PANEL - Abnormal; Notable for the following:    Sodium 131 (*)    Chloride 95 (*)    Glucose, Bld 128 (*)    BUN 38 (*)    Creatinine, Ser 8.34 (*)    GFR calc non Af Amer 5 (*)    GFR calc Af Amer 6 (*)    All other components within normal limits  PHOSPHORUS - Abnormal; Notable for the following:    Phosphorus 6.3 (*)    All other components within normal limits  TROPONIN I - Abnormal; Notable  for the following:    Troponin I 0.06 (*)    All other components within normal limits  CULTURE, BLOOD (ROUTINE X 2)  CULTURE, BLOOD (ROUTINE X 2)  MAGNESIUM  PROTIME-INR  INFLUENZA PANEL BY PCR (TYPE A & B, H1N1)  BRAIN NATRIURETIC PEPTIDE  TROPONIN I  TROPONIN I  I-STAT TROPOININ, ED  I-STAT CHEM 8, ED  I-STAT ARTERIAL BLOOD GAS, ED    Imaging Review Dg Chest Port 1 View  07/25/2014   CLINICAL DATA:  Shortness of breath.  EXAM: PORTABLE CHEST - 1 VIEW  COMPARISON:  Most recent chest radiographs 01/30/2006  FINDINGS: The heart is at the upper limits of normal in size. There is vascular congestion and pulmonary edema. Bilateral patchy lower lobe airspace  opacities, left greater than right. Probable left pleural effusion. Heart appears at the upper limits of normal in size. There is no pneumothorax. No acute osseous abnormalities are seen.  IMPRESSION: 1. Vascular congestion and pulmonary edema. Findings suggest fluid overload/CHF. 2. Bibasilar opacities, left greater than right. This may be vascular in etiology versus atelectasis or pneumonia. There is a probable left pleural effusion.   Electronically Signed   By: Jeb Levering M.D.   On: 07/25/2014 21:56     EKG Interpretation   Date/Time:  Sunday July 25 2014 21:19:21 EST Ventricular Rate:  118 PR Interval:  150 QRS Duration: 74 QT Interval:  328 QTC Calculation: 459 R Axis:   77 Text Interpretation:  Sinus tachycardia Biatrial enlargement Abnormal ECG  No significant change since last tracing Confirmed by POLLINA  MD,  CHRISTOPHER [phone]) on 07/25/2014 10:02:06 PM      MDM   Final diagnoses:  SOB (shortness of breath)  End-stage renal disease on hemodialysis  Flash pulmonary edema  Hypertensive urgency  Lupus (systemic lupus erythematosus)   54 yo F hx of Lupus, ESRD on HD MWF, HTN, GERD, PUD, presents with CC SOB.    Pt with worsening symptoms for 2 days.  C/o subjective fever, chills, nausea, diarrhea, myalgias,  nonproductive cough.  No missed dialysis sessions.  No sick contacts.    Pt in respiratory distress on arrival, tripoding, satting 60% on RA, rales on lung exam, hypertensive to [id number] systolic, tachycardic to [id number].  Rectal temp 102.   Pt placed on BiPAP.  Nitro gtt.    CXR demonstrates pulmonary edema, with possible PNA.  WBC 12.7.  Troponin WNL.  K was 4.7.  Cr 8.34 similar to priors.    Vanc and Zosyn given to cover for possible HCAP.  Blood Cx pending. Influenza panel pending.    Pt looking better following treatments.  HR coming down, BP improving, and breathing improved, however still tachypneic to 30-40's.    Dr. Joelyn Oms from Nephrology consulted, as patient will need emergent dialysis.  Dr. Sherral Hammers from [facility] consulted for admission.  Pt understands and agrees with plan.   Sinda Du  Discussed patient with Dr. [name].    Sinda Du, MD 07/25/14 [id number]  Orpah Greek, MD 07/26/14 [id number]

## 2014-07-25 NOTE — ED Provider Notes (Signed)
Patient presented to the ER with shortness of breath. Patient reports cold symptoms including cough that have been ongoing for the last 2 days. Tonight, however, she acutely became very short of breath. She does report subjective fevers at home. She is not experiencing chest pain.  Face to face Exam: HEENT - PERRLA Lungs - tachypnea, diffuse rales Heart - tachycardia, no M/R/G Abd - S/NT/ND Neuro - alert, oriented x3  Plan: Patient in moderate respiratory distress upon arrival to the ER. Examination consistent with volume overload. She does have symptomatology consistent with upper respiratory infection, cannot rule out, pneumonia based on x-ray. Will treat for acute, consult nephrology for dialysis. Patient tolerating BiPAP and appears to be much improved with BiPAP in place.   Orpah Greek, MD 07/25/14 2219

## 2014-07-25 NOTE — ED Notes (Signed)
Pt reports cold/cough symptoms x 2 days, taking Robitussin.  Today shortness of breath.  O2 sats 61% at triage, tech placed on 2L O2 via [place] increased 68%, increased to 4L increased to 75%, placed on nonrebreather,

## 2014-07-25 NOTE — Consult Note (Signed)
[name] 07/25/2014 [name] Requesting Physician:  Connie Holiday, MD  Reason for Consult:  Hypoxic RF, HTN emergency, ESRD HPI:  51F ESRD MWF at [facility] via RUA AVF and URI like symptoms for the past 2 days. She last attended her hemodialysis on 07/23/14, nearing her dry weight, completing a full treatment. Over the course of today she developed increased respiratory difficulty and presented to the emergency room with hypoxia requiring BiPAP. She has been very hypertensive, 200s/120s, requiring a nitroglycerin drip. He tells me that she did not take her blood pressure pills because she was not eating well due to not feeling good. They include amlodipine, lisinopril, labetalol. She is able to communicate currently. In the ER she was also found to be febrile, MAXIMUM TEMPERATURE 102 Fahrenheit. She has bilateral infiltrates on her chest x-ray and concern for edema and pneumonia.  She has had a sick child at home with her with cold-like symptoms.   ROS Balance of 12 systems is negative w/ exceptions as above  Outpt HD Orders Unit: [facility] Days: MWF Time: 3.5h Dialyzer: F180 [name]: 64kg K/Ca: 2/2 Access: RUA AVF BFR/DFR: 450 / 800 UF Proflie: profile 2 VDRA: Calcitriol 69mcg qTx EPO: Mircera 75 q2wk IV Fe: Venofer 50 qWk Heparin: 6400 IU IVP qTx Most Recent Phos / PTH: 5.7 / 417 Most Recent TSAT / Ferritin: 24 / NA Most Recent eKT/V: 1.65 Treatment Adherence: Excellent  PMH  Past Medical History  Diagnosis Date  . Hypertension   . Blood transfusion '08    [facility]  . GERD (gastroesophageal reflux disease)   . Chronic kidney disease     dialysis   Mon Wed Fri  . Lupus   . Dysfunctional uterine bleeding 12/19/2010  . Anemia   . Peptic ulcer disease   . Hx of hiatal hernia    PSH  Past Surgical History  Procedure Laterality Date  . Dialysis fistula creation  2007   [place] History reviewed. No pertinent family history. SH  reports that she has been smoking.  She does not have any smokeless  tobacco history on file. She reports that she drinks alcohol. She reports that she uses illicit drugs (Marijuana). Allergies No Known Allergies Home medications Prior to Admission medications   Medication Sig Start Date End Date Taking? Authorizing Provider  amLODipine (NORVASC) 10 MG tablet Take 10 mg by mouth daily.      Historical Provider, MD  b complex-vitamin c-folic acid (NEPHRO-VITE) 0.8 MG TABS tablet Take 0.8 mg by mouth at bedtime.    Historical Provider, MD  calcium acetate (PHOSLO) 667 MG capsule Take 667 mg by mouth 2 (two) times daily.      Historical Provider, MD  labetalol (NORMODYNE) 300 MG tablet Take 300 mg by mouth daily.      Historical Provider, MD  lisinopril (PRINIVIL,ZESTRIL) 20 MG tablet Take 20 mg by mouth daily.    Historical Provider, MD  promethazine (PHENERGAN) 25 MG tablet Take 25 mg by mouth every 6 (six) hours as needed for nausea.    Historical Provider, MD  ranitidine (ZANTAC) 150 MG tablet Take 150 mg by mouth 2 (two) times daily.      Historical Provider, MD  simvastatin (ZOCOR) 40 MG tablet Take 40 mg by mouth every evening.    Historical Provider, MD    Current Medications Scheduled Meds:  Continuous Infusions: . vancomycin 1,500 mg (07/25/14 2256)   PRN Meds:.  CBC No results for input(s): WBC, NEUTROABS, HGB, HCT, MCV, PLT in  the last 168 hours. Basic Metabolic Panel No results for input(s): NA, K, CL, CO2, GLUCOSE, BUN, CREATININE, ALB, CALCIUM, PHOS in the last 168 hours.  Physical Exam  Blood pressure 207/119, pulse 116, temperature 102 F (38.9 C), temperature source Rectal, resp. rate 24, last menstrual period 12/02/2010, SpO2 100 %. GEN: BIPAP, in mild resp distress, inc WOB ENT: NCAT, poor dentition EYES: EOMI CV: tachy, regular, nl s1s2 no rub PULM: b/l diffuse crackles, as above ABD: s/nt/nd SKIN: no rashes/lesions EXT:no LEE VASC: RUA AVF + B/T   A/P 54 year old female with hypertensive emergency, pulmonary edema, hypoxic  respiratory failure on BiPAP, in need of emergent dialysis. Potassium is okay at 4.4. She also has a fever and concern for pneumonia.  1. ESRD:  1. Outpt [facility] MWF via RUA AVF w/ [name] 64kg 2. HD tonight, given vol /overload and HTN 3. Standard Heparin 2. HTN Emergency / Vol Overlaod 1. As above, 4L UF goal 2. Ween Nitro gtt as able 3. Cont home meds 3. Anemia: Cont ESA as inpt, follow Hb 4. MBD: Cont outpt medications for now 5. Fever, Infiltrates on CXR: PNA, Flu? Per admitting team 1. Rec vanc and zosyn in ED  Pearson Grippe MD 07/25/2014, 10:58 PM

## 2014-07-26 ENCOUNTER — Inpatient Hospital Stay (HOSPITAL_COMMUNITY): Payer: Medicare Other

## 2014-07-26 DIAGNOSIS — I342 Nonrheumatic mitral (valve) stenosis: Secondary | ICD-10-CM

## 2014-07-26 DIAGNOSIS — R0602 Shortness of breath: Secondary | ICD-10-CM | POA: Insufficient documentation

## 2014-07-26 DIAGNOSIS — I361 Nonrheumatic tricuspid (valve) insufficiency: Secondary | ICD-10-CM

## 2014-07-26 LAB — LACTATE DEHYDROGENASE: LDH: 234 U/L (ref 94–250)

## 2014-07-26 LAB — CBC WITH DIFFERENTIAL/PLATELET
Basophils Absolute: 0 10*3/uL (ref 0.0–0.1)
Basophils Relative: 0 % (ref 0–1)
EOS PCT: 0 % (ref 0–5)
Eosinophils Absolute: 0 10*3/uL (ref 0.0–0.7)
HCT: 30.4 % — ABNORMAL LOW (ref 36.0–46.0)
Hemoglobin: 9.9 g/dL — ABNORMAL LOW (ref 12.0–15.0)
LYMPHS PCT: 6 % — AB (ref 12–46)
Lymphs Abs: 0.6 10*3/uL — ABNORMAL LOW (ref 0.7–4.0)
MCH: 30.5 pg (ref 26.0–34.0)
MCHC: 32.6 g/dL (ref 30.0–36.0)
MCV: 93.5 fL (ref 78.0–100.0)
Monocytes Absolute: 0.2 10*3/uL (ref 0.1–1.0)
Monocytes Relative: 3 % (ref 3–12)
NEUTROS ABS: 7.9 10*3/uL — AB (ref 1.7–7.7)
NEUTROS PCT: 91 % — AB (ref 43–77)
PLATELETS: 192 10*3/uL (ref 150–400)
RBC: 3.25 MIL/uL — ABNORMAL LOW (ref 3.87–5.11)
RDW: 15.2 % (ref 11.5–15.5)
WBC: 8.7 10*3/uL (ref 4.0–10.5)

## 2014-07-26 LAB — COMPREHENSIVE METABOLIC PANEL
ALBUMIN: 3.1 g/dL — AB (ref 3.5–5.2)
ALT: 20 U/L (ref 0–35)
ANION GAP: 10 (ref 5–15)
AST: 34 U/L (ref 0–37)
Alkaline Phosphatase: 91 U/L (ref 39–117)
BUN: 13 mg/dL (ref 6–23)
CHLORIDE: 97 mmol/L (ref 96–112)
CO2: 27 mmol/L (ref 19–32)
Calcium: 8.5 mg/dL (ref 8.4–10.5)
Creatinine, Ser: 3.55 mg/dL — ABNORMAL HIGH (ref 0.50–1.10)
GFR, EST AFRICAN AMERICAN: 16 mL/min — AB (ref >90)
GFR, EST NON AFRICAN AMERICAN: 14 mL/min — AB (ref >90)
Glucose, Bld: 89 mg/dL (ref 70–99)
POTASSIUM: 3.4 mmol/L — AB (ref 3.5–5.1)
Sodium: 134 mmol/L — ABNORMAL LOW (ref 135–145)
Total Bilirubin: 0.9 mg/dL (ref 0.3–1.2)
Total Protein: 7.5 g/dL (ref 6.0–8.3)

## 2014-07-26 LAB — POCT I-STAT, CHEM 8
BUN: 44 mg/dL — ABNORMAL HIGH (ref 6–23)
CALCIUM ION: 1.13 mmol/L (ref 1.12–1.23)
CHLORIDE: 97 mmol/L (ref 96–112)
CREATININE: 8.4 mg/dL — AB (ref 0.50–1.10)
Glucose, Bld: 130 mg/dL — ABNORMAL HIGH (ref 70–99)
HCT: 40 % (ref 36.0–46.0)
Hemoglobin: 13.6 g/dL (ref 12.0–15.0)
POTASSIUM: 4.7 mmol/L (ref 3.5–5.1)
Sodium: 133 mmol/L — ABNORMAL LOW (ref 135–145)
TCO2: 22 mmol/L (ref 0–100)

## 2014-07-26 LAB — INFLUENZA PANEL BY PCR (TYPE A & B)
H1N1 flu by pcr: DETECTED — AB
Influenza A By PCR: POSITIVE — AB
Influenza B By PCR: NEGATIVE

## 2014-07-26 LAB — CREATININE, SERUM
CREATININE: 8.92 mg/dL — AB (ref 0.50–1.10)
GFR calc Af Amer: 5 mL/min — ABNORMAL LOW (ref >90)
GFR calc non Af Amer: 5 mL/min — ABNORMAL LOW (ref >90)

## 2014-07-26 LAB — VITAMIN B12: Vitamin B-12: 483 pg/mL (ref 211–911)

## 2014-07-26 LAB — CBC
HEMATOCRIT: 29.6 % — AB (ref 36.0–46.0)
Hemoglobin: 9.6 g/dL — ABNORMAL LOW (ref 12.0–15.0)
MCH: 30.4 pg (ref 26.0–34.0)
MCHC: 32.4 g/dL (ref 30.0–36.0)
MCV: 93.7 fL (ref 78.0–100.0)
PLATELETS: 167 10*3/uL (ref 150–400)
RBC: 3.16 MIL/uL — ABNORMAL LOW (ref 3.87–5.11)
RDW: 15.2 % (ref 11.5–15.5)
WBC: 10 10*3/uL (ref 4.0–10.5)

## 2014-07-26 LAB — POCT I-STAT 3, ART BLOOD GAS (G3+)
Acid-base deficit: 2 mmol/L (ref 0.0–2.0)
BICARBONATE: 23.3 meq/L (ref 20.0–24.0)
O2 Saturation: 94 %
PCO2 ART: 43.6 mmHg (ref 35.0–45.0)
PH ART: 7.345 — AB (ref 7.350–7.450)
Patient temperature: 102
TCO2: 25 mmol/L (ref 0–100)
pO2, Arterial: 81 mmHg (ref 80.0–100.0)

## 2014-07-26 LAB — FOLATE: Folate: 7.3 ng/mL

## 2014-07-26 LAB — IRON AND TIBC
IRON: 16 ug/dL — AB (ref 42–145)
Saturation Ratios: 10 % — ABNORMAL LOW (ref 20–55)
TIBC: 167 ug/dL — ABNORMAL LOW (ref 250–470)
UIBC: 151 ug/dL (ref 125–400)

## 2014-07-26 LAB — RETICULOCYTES
RBC.: 3.28 MIL/uL — ABNORMAL LOW (ref 3.87–5.11)
Retic Count, Absolute: 72.2 10*3/uL (ref 19.0–186.0)
Retic Ct Pct: 2.2 % (ref 0.4–3.1)

## 2014-07-26 LAB — TSH: TSH: 0.711 u[IU]/mL (ref 0.350–4.500)

## 2014-07-26 LAB — SEDIMENTATION RATE: Sed Rate: 118 mm/hr — ABNORMAL HIGH (ref 0–22)

## 2014-07-26 LAB — MRSA PCR SCREENING: MRSA by PCR: NEGATIVE

## 2014-07-26 LAB — C-REACTIVE PROTEIN: CRP: 16.3 mg/dL — ABNORMAL HIGH (ref <0.60)

## 2014-07-26 LAB — MAGNESIUM: MAGNESIUM: 1.9 mg/dL (ref 1.5–2.5)

## 2014-07-26 LAB — TROPONIN I
Troponin I: 0.27 ng/mL — ABNORMAL HIGH (ref <0.031)
Troponin I: 0.31 ng/mL — ABNORMAL HIGH (ref <0.031)

## 2014-07-26 LAB — FERRITIN: Ferritin: 3294 ng/mL — ABNORMAL HIGH (ref 10–291)

## 2014-07-26 MED ORDER — PIPERACILLIN-TAZOBACTAM IN DEX 2-0.25 GM/50ML IV SOLN
2.2500 g | Freq: Three times a day (TID) | INTRAVENOUS | Status: DC
  Administered 2014-07-26 – 2014-07-27 (×3): 2.25 g via INTRAVENOUS
  Filled 2014-07-26 (×6): qty 50

## 2014-07-26 MED ORDER — NITROGLYCERIN IN D5W 200-5 MCG/ML-% IV SOLN
0.0000 ug/min | INTRAVENOUS | Status: DC
  Administered 2014-07-26: 50 ug/min via INTRAVENOUS

## 2014-07-26 MED ORDER — METOPROLOL TARTRATE 1 MG/ML IV SOLN
5.0000 mg | Freq: Four times a day (QID) | INTRAVENOUS | Status: DC
  Administered 2014-07-26 (×2): 5 mg via INTRAVENOUS
  Filled 2014-07-26 (×2): qty 5

## 2014-07-26 MED ORDER — TRAMADOL HCL 50 MG PO TABS
ORAL_TABLET | ORAL | Status: AC
  Filled 2014-07-26: qty 1

## 2014-07-26 MED ORDER — AMLODIPINE BESYLATE 10 MG PO TABS
10.0000 mg | ORAL_TABLET | Freq: Every day | ORAL | Status: DC
  Administered 2014-07-26 – 2014-08-01 (×7): 10 mg via ORAL
  Filled 2014-07-26 (×7): qty 1

## 2014-07-26 MED ORDER — ACETAMINOPHEN 325 MG PO TABS
650.0000 mg | ORAL_TABLET | Freq: Four times a day (QID) | ORAL | Status: DC | PRN
  Administered 2014-07-26 – 2014-07-30 (×3): 650 mg via ORAL
  Filled 2014-07-26 (×2): qty 2

## 2014-07-26 MED ORDER — HYDRALAZINE HCL 20 MG/ML IJ SOLN
10.0000 mg | INTRAMUSCULAR | Status: DC | PRN
  Administered 2014-07-26: 10 mg via INTRAVENOUS
  Filled 2014-07-26: qty 1

## 2014-07-26 MED ORDER — OSELTAMIVIR PHOSPHATE 30 MG PO CAPS
30.0000 mg | ORAL_CAPSULE | Freq: Every day | ORAL | Status: DC
  Administered 2014-07-26: 30 mg via ORAL
  Filled 2014-07-26 (×2): qty 1

## 2014-07-26 MED ORDER — HYDROMORPHONE HCL 1 MG/ML IJ SOLN
0.5000 mg | Freq: Once | INTRAMUSCULAR | Status: AC
  Administered 2014-07-26: 1 mg via INTRAVENOUS
  Filled 2014-07-26: qty 1

## 2014-07-26 MED ORDER — NEPRO/CARBSTEADY PO LIQD: 237.0000 mL | ORAL | Status: DC | PRN

## 2014-07-26 MED ORDER — TRAMADOL HCL 50 MG PO TABS
50.0000 mg | ORAL_TABLET | Freq: Once | ORAL | Status: AC
Start: 2014-07-26 — End: 2014-07-26
  Administered 2014-07-26: 50 mg via ORAL

## 2014-07-26 MED ORDER — LABETALOL HCL 300 MG PO TABS
300.0000 mg | ORAL_TABLET | Freq: Three times a day (TID) | ORAL | Status: DC
  Administered 2014-07-26 – 2014-07-29 (×10): 300 mg via ORAL
  Filled 2014-07-26 (×13): qty 1

## 2014-07-26 MED ORDER — VANCOMYCIN HCL IN DEXTROSE 750-5 MG/150ML-% IV SOLN
750.0000 mg | Freq: Once | INTRAVENOUS | Status: AC
  Administered 2014-07-26: 750 mg via INTRAVENOUS
  Filled 2014-07-26: qty 150

## 2014-07-26 MED ORDER — SODIUM CHLORIDE 0.9 % IV SOLN: 100.0000 mL | INTRAVENOUS | Status: DC | PRN

## 2014-07-26 MED ORDER — OSELTAMIVIR PHOSPHATE 75 MG PO CAPS: 75.0000 mg | ORAL_CAPSULE | Freq: Two times a day (BID) | ORAL | Status: DC

## 2014-07-26 MED ORDER — HEPARIN SODIUM (PORCINE) 1000 UNIT/ML DIALYSIS: 1000.0000 [IU] | INTRAMUSCULAR | Status: DC | PRN

## 2014-07-26 MED ORDER — HEPARIN SODIUM (PORCINE) 1000 UNIT/ML DIALYSIS
100.0000 [IU]/kg | INTRAMUSCULAR | Status: DC | PRN
  Administered 2014-07-26: 7500 [IU] via INTRAVENOUS_CENTRAL

## 2014-07-26 MED ORDER — PREDNISONE 20 MG PO TABS
40.0000 mg | ORAL_TABLET | Freq: Every day | ORAL | Status: DC
  Administered 2014-07-27 – 2014-07-28 (×2): 40 mg via ORAL
  Filled 2014-07-26 (×3): qty 2

## 2014-07-26 MED ORDER — ASPIRIN 81 MG PO CHEW
81.0000 mg | CHEWABLE_TABLET | Freq: Every day | ORAL | Status: DC
  Administered 2014-07-26 – 2014-08-01 (×7): 81 mg via ORAL
  Filled 2014-07-26 (×7): qty 1

## 2014-07-26 MED ORDER — VANCOMYCIN HCL IN DEXTROSE 750-5 MG/150ML-% IV SOLN: 750.0000 mg | INTRAVENOUS | Status: DC

## 2014-07-26 MED ORDER — LISINOPRIL 40 MG PO TABS
40.0000 mg | ORAL_TABLET | Freq: Every day | ORAL | Status: DC
  Administered 2014-07-26 – 2014-08-01 (×7): 40 mg via ORAL
  Filled 2014-07-26 (×7): qty 1

## 2014-07-26 MED ORDER — LIDOCAINE-PRILOCAINE 2.5-2.5 % EX CREA: 1.0000 "application " | TOPICAL_CREAM | CUTANEOUS | Status: DC | PRN

## 2014-07-26 MED ORDER — LIDOCAINE HCL (PF) 1 % IJ SOLN: 5.0000 mL | INTRAMUSCULAR | Status: DC | PRN

## 2014-07-26 MED ORDER — HYDRALAZINE HCL 20 MG/ML IJ SOLN
10.0000 mg | INTRAMUSCULAR | Status: DC
  Administered 2014-07-26: 10 mg via INTRAVENOUS
  Administered 2014-07-26: 0.5 mg via INTRAVENOUS
  Administered 2014-07-26 – 2014-07-27 (×2): 10 mg via INTRAVENOUS
  Filled 2014-07-26: qty 1
  Filled 2014-07-26 (×2): qty 0.5
  Filled 2014-07-26: qty 1
  Filled 2014-07-26 (×3): qty 0.5
  Filled 2014-07-26: qty 1
  Filled 2014-07-26 (×3): qty 0.5

## 2014-07-26 MED ORDER — PENTAFLUOROPROP-TETRAFLUOROETH EX AERO: 1.0000 "application " | INHALATION_SPRAY | CUTANEOUS | Status: DC | PRN

## 2014-07-26 MED ORDER — SODIUM CHLORIDE 0.9 % IV SOLN
INTRAVENOUS | Status: DC
  Administered 2014-07-26: 11:00:00 via INTRAVENOUS

## 2014-07-26 NOTE — Progress Notes (Signed)
Arrival Method:via wheelchair  Mental Status: alert and oriented x4 Telemetry: n/a Skin: intact Tubes: none IV: 2x LFA Pain: 0/10 Family: none present Living Situation: From home  Safety Measures: call bell in reach, bed in lowest position, bed alarm on, pt instructed to call if she needs to use bathroom 6E Orientation: oriented to staff and room  Pt on phone with family laying comfortably in bed. Will continue to monitor.

## 2014-07-26 NOTE — Progress Notes (Signed)
  Echocardiogram 2D Echocardiogram has been performed.  [name] 07/26/2014, 10:14 AM

## 2014-07-26 NOTE — Progress Notes (Signed)
  [facility] Progress Note   Subjective: bad HA from the "nitro", breathing better. Off bipap since having HD. Cramped at 2.5kg UF.  CXR today resolution of pulm edema.   Filed Vitals:   07/26/14 0645 07/26/14 0700 07/26/14 0715 07/26/14 0800  BP: 227/125 209/116 212/122 209/113  Pulse: 108 107 106 110  Temp:   101 F (38.3 C)   TempSrc:   Oral   Resp: 31 28 21 20   Height:      Weight:      SpO2: 91% 94% 95% 95%   Exam: Alert, no distress, looks uncomfortable No jvd Chest occ wheezes L side, R clear  RRR no MRG Abd soft, NTND, no mass or ascites No LE or UE edema RUA AVF patent Neuro is nf, ox 3  HD: MWF [place] 3.5h   64kg   2/2.0 BAth   RUA AVF  Heparin 6400  Prof 2 Calcitriol 1 ug / hd,  Mircera 75 ug every 2 wk,  Venofer 50/wk       Assessment: 1. Pulm edema - vol ^ and/or due to uncont HTN. Resolved by CXR after HD last night 2. HTN uncontrolled - IV meds til taking po 3. Nausea/ vomiting 4. Fever - prob viral illness 5. Anemia cont ESA 6. MBD cont meds  Plan- next HD Wed. Resume po BP meds when N/V better. Added sched IV hydralazine to sched IV MTP.    [name] MD  pager [phone]    cell [phone]  07/26/2014, 8:57 AM     Recent Labs Lab 07/25/14 2140 07/26/14 0047  NA 131*  --   K 4.6  --   CL 95*  --   CO2 24  --   GLUCOSE 128*  --   BUN 38*  --   CREATININE 8.34* 8.92*  CALCIUM 9.2  --   PHOS 6.3*  --     Recent Labs Lab 07/25/14 2140  AST 33  ALT 19  ALKPHOS 106  BILITOT 0.9  PROT 8.0  ALBUMIN 3.5    Recent Labs Lab 07/25/14 2140 07/26/14 0047  WBC 12.7* 10.0  NEUTROABS 11.0*  --   HGB 11.5* 9.6*  HCT 36.3 29.6*  MCV 95.3 93.7  PLT 201 167   . azithromycin  500 mg Intravenous Q24H  . cefTRIAXone (ROCEPHIN)  IV  1 g Intravenous Q24H  . heparin  5,000 Units Subcutaneous 3 times per day  . methylPREDNISolone (SOLU-MEDROL) injection  60 mg Intravenous Q24H  . metoprolol  5 mg Intravenous 4 times per day   . sodium chloride  3 mL Intravenous Q12H     sodium chloride, sodium chloride, sodium chloride, acetaminophen, feeding supplement (NEPRO CARB STEADY), heparin, hydrALAZINE, lidocaine (PF), lidocaine-prilocaine, pentafluoroprop-tetrafluoroeth, sodium chloride

## 2014-07-26 NOTE — Consult Note (Signed)
ANTIBIOTIC CONSULT NOTE - INITIAL  Pharmacy Consult for Vancomycin and Zosyn Indication: r/o HCAP  No Known Allergies  Patient Measurements: Height: 5\' 6"  (167.6 cm) Weight: 138 lb 3.7 oz (62.7 kg) IBW/kg (Calculated) : 59.3  Vital Signs: Temp: 98.5 F (36.9 C) (03/07 1300) Temp Source: Oral (03/07 1300) BP: 153/79 mmHg (03/07 1300) Pulse Rate: 97 (03/07 1300) Intake/Output from previous day: 03/06 0701 - 03/07 0700 In: 22.5 [I.V.:22.5] Out: 2505  Intake/Output from this shift: Total I/O In: 565 [P.O.:250; I.V.:15; IV Piggyback:300] Out: -   Labs:  Recent Labs  07/25/14 2140 07/25/14 2156 07/26/14 0047 07/26/14 0841  WBC 12.7*  --  10.0 8.7  HGB 11.5* 13.6 9.6* 9.9*  PLT 201  --  167 192  CREATININE 8.34* 8.40* 8.92* 3.55*   Estimated Creatinine Clearance: 17.4 mL/min (by C-G formula based on Cr of 3.55).   Microbiology: Recent Results (from the past 720 hour(s))  MRSA PCR Screening     Status: None   Collection Time: 07/26/14  6:20 AM  Result Value Ref Range Status   MRSA by PCR NEGATIVE NEGATIVE Final    Comment:        The GeneXpert MRSA Assay (FDA approved for NASAL specimens only), is one component of a comprehensive MRSA colonization surveillance program. It is not intended to diagnose MRSA infection nor to guide or monitor treatment for MRSA infections.     Medical History: Past Medical History  Diagnosis Date  . Hypertension   . Blood transfusion '08    [facility]  . GERD (gastroesophageal reflux disease)   . Chronic kidney disease     dialysis   Mon Wed Fri  . Lupus   . Dysfunctional uterine bleeding 12/19/2010  . Anemia   . Peptic ulcer disease   . Hx of hiatal hernia     Assessment: 54yof w/ ESRD on HD MWF (last session pta was 3/4) admitted with acute SOB and volume overload. She underwent emergent dialysis on 3/7. She was also thought to have possible pneumonia and was given 1 dose of vancomycin and zosyn on 3/6, then transitioned to  azithromycin and ceftriaxone. TRH would like to broaden back to vancomycin and zosyn today. She is also influenza positive and started on tamiflu.   Vancomycin 1500mg  given 3/6 @ 2256 Zosyn 3.375g given 3/6 @ 2225 Emergent HD 3/7 @ 0000, 4 hours, BFR 400 Next HD planned for Wednesday 3/9  Goal of Therapy:  Pre-HD vancomycin level 15-25  Plan:  1) Vancomycin 750mg  IV x 1 now then 750mg  IV qHD MWF 2) Zosyn 2.25g IV q8 3) Follow HD schedule, cultures, LOT, level if needed  Deboraha Sprang 07/26/2014,2:45 PM

## 2014-07-26 NOTE — Progress Notes (Signed)
[facility] PROGRESS NOTE  [name] [id number] DOB: 12-17-1960 DOA: 07/25/2014 PCP: No primary care provider on file.  Assessment/Plan: Principal Problem:   Flash pulmonary edema Active Problems:   End-stage renal disease on hemodialysis   Hypertensive urgency   Lupus (systemic lupus erythematosus)   DUB (dysfunctional uterine bleeding)   Anemia   Peptic ulcer disease   Stroke   Pulmonary edema    Flash pulmonary edema/influenza A/pneumonia Improving after hemodialysis/influenza A Start the patient on Tamiflu Status post emergent   hemodialysis 2-D echo pending  CAP?Marland KitchenNilda [name] pneumonia -Continue empiric antibiotic, vancomycin and Zosyn Switch Solu-Medrol to prednisone by mouth Follow blood culture  Hypertensive urgency Discontinue nitroglycerin -Emergent hemodialysis Discontinue metoprolol Restart home medications including lisinopril, labetalol, Norvasc Continue when necessary hydralazine   Elevated troponin -Demand ischemia vs true MI  -Trend troponin; if continues to increase will consult cardiology 0.27 If no wall motion abnormalities on echo no further intervention is needed   End-stage renal disease on hemodialysis M/W/F -Currently receiving emergent hemodialysis; HD per nephrology   Hyponatremia -Most likely secondary to her ESRD, however will check patient for CHF secondary to lupus -  Fever  Secondary to flu Vs lupus flare  -See CAP  LupusFlare? -See CAP -Once patient able to take PO medication would start NSAIDs -Continue steroids  Anemia -Anemia workup pending  Code Status: full Family Communication: family updated about patient's clinical progress Disposition Plan:  May transfer out of telemetry today    Brief narrative: [name] is a 54 y.o. BF PMHx stroke 2008, HTN, ESRD on HD M/W/F Lupus, DUB, anemia, PUD. Presents with cold symptoms including nonproductive cough that have been ongoing for the last 2 days,  acute SOB.[name] Has not missed her HD session. States base weight 64 kg postdialysis. Patient says he had an echocardiogram 6 months ago at [facility]. PCP is a Dr. Sharene [name] at [place]. States only Lupus symptoms have been her renal failure.  Consultants:  Nephrology  Procedures:  Hemodialysis  Antibiotics: Rocephin azithromycin  HPI/Subjective: Patient hypertensive systolic blood pressure greater than 200 at the time of my interview, had just finished dialysis shortness of breath is improved  Objective: Filed Vitals:   07/26/14 1000 07/26/14 1015 07/26/14 1100 07/26/14 1145  BP: 172/85 154/80 122/68   Pulse: 97 101 90 89  Temp:    98.9 F (37.2 C)  TempSrc:    Oral  Resp: 21 25 21 26   Height:      Weight:      SpO2: 98% 96% 99% 100%    Intake/Output Summary (Last 24 hours) at 07/26/14 1200 Last data filed at 07/26/14 1000  Gross per 24 hour  Intake  587.5 ml  Output   2505 ml  Net -1917.5 ml    Exam:  General: No acute respiratory distress Lungs: Clear to auscultation bilaterally without wheezes or crackles Cardiovascular: Regular rate and rhythm without murmur gallop or rub normal S1 and S2 Abdomen: Nontender, nondistended, soft, bowel sounds positive, no rebound, no ascites, no appreciable mass Extremities: No significant cyanosis, clubbing, or edema bilateral lower extremities      Data Reviewed: Basic Metabolic Panel:  Recent Labs Lab 07/25/14 2140 07/25/14 2156 07/26/14 0047 07/26/14 0841  NA 131* 133*  --  134*  K 4.6 4.7  --  3.4*  CL 95* 97  --  97  CO2 24  --   --  27  GLUCOSE 128* 130*  --  89  BUN 38* 44*  --  13  CREATININE 8.34* 8.40* 8.92* 3.55*  CALCIUM 9.2  --   --  8.5  MG 2.0  --   --  1.9  PHOS 6.3*  --   --   --     Liver Function Tests:  Recent Labs Lab 07/25/14 2140 07/26/14 0841  AST 33 34  ALT 19 20  ALKPHOS 106 91  BILITOT 0.9 0.9  PROT 8.0 7.5  ALBUMIN 3.5 3.1*   No results for input(s): LIPASE, AMYLASE  in the last 168 hours. No results for input(s): AMMONIA in the last 168 hours.  CBC:  Recent Labs Lab 07/25/14 2140 07/25/14 2156 07/26/14 0047 07/26/14 0841  WBC 12.7*  --  10.0 8.7  NEUTROABS 11.0*  --   --  7.9*  HGB 11.5* 13.6 9.6* 9.9*  HCT 36.3 40.0 29.6* 30.4*  MCV 95.3  --  93.7 93.5  PLT 201  --  167 192    Cardiac Enzymes:  Recent Labs Lab 07/25/14 2140 07/26/14 0841  TROPONINI 0.06* 0.27*   BNP (last 3 results)  Recent Labs  07/25/14 2140  BNP 2460.9*    ProBNP (last 3 results) No results for input(s): PROBNP in the last 8760 hours.    CBG: No results for input(s): GLUCAP in the last 168 hours.  Recent Results (from the past 240 hour(s))  MRSA PCR Screening     Status: None   Collection Time: 07/26/14  6:20 AM  Result Value Ref Range Status   MRSA by PCR NEGATIVE NEGATIVE Final    Comment:        The GeneXpert MRSA Assay (FDA approved for NASAL specimens only), is one component of a comprehensive MRSA colonization surveillance program. It is not intended to diagnose MRSA infection nor to guide or monitor treatment for MRSA infections.      Studies: Dg Chest Port 1 View  07/26/2014   CLINICAL DATA:  Pulmonary edema.  EXAM: PORTABLE CHEST - 1 VIEW  COMPARISON:  07/25/2014  FINDINGS: Cardiac enlargement with pulmonary vascular congestion. Bilateral perihilar edema. Small bilateral pleural effusions, greater on the left. No significant change since previous study. No pneumothorax.  IMPRESSION: No active disease.   Electronically Signed   By: Lucienne Capers M.D.   On: 07/26/2014 00:51   Dg Chest Port 1 View  07/25/2014   CLINICAL DATA:  Shortness of breath.  EXAM: PORTABLE CHEST - 1 VIEW  COMPARISON:  Most recent chest radiographs 01/30/2006  FINDINGS: The heart is at the upper limits of normal in size. There is vascular congestion and pulmonary edema. Bilateral patchy lower lobe airspace opacities, left greater than right. Probable left pleural  effusion. Heart appears at the upper limits of normal in size. There is no pneumothorax. No acute osseous abnormalities are seen.  IMPRESSION: 1. Vascular congestion and pulmonary edema. Findings suggest fluid overload/CHF. 2. Bibasilar opacities, left greater than right. This may be vascular in etiology versus atelectasis or pneumonia. There is a probable left pleural effusion.   Electronically Signed   By: Jeb Levering M.D.   On: 07/25/2014 21:56    Scheduled Meds: . amLODipine  10 mg Oral Daily  . azithromycin  500 mg Intravenous Q24H  . cefTRIAXone (ROCEPHIN)  IV  1 g Intravenous Q24H  . heparin  5,000 Units Subcutaneous 3 times per day  . hydrALAZINE  10 mg Intravenous Q4H  . labetalol  300 mg Oral TID  . lisinopril  40 mg Oral Daily  . methylPREDNISolone (SOLU-MEDROL)  injection  60 mg Intravenous Q24H  . metoprolol  5 mg Intravenous 4 times per day  . sodium chloride  3 mL Intravenous Q12H   Continuous Infusions: . sodium chloride 10 mL/hr at 07/26/14 1100    Principal Problem:   Flash pulmonary edema Active Problems:   End-stage renal disease on hemodialysis   Hypertensive urgency   Lupus (systemic lupus erythematosus)   DUB (dysfunctional uterine bleeding)   Anemia   Peptic ulcer disease   Stroke   Pulmonary edema    Time spent: 40 minutes   [facility] Pager [phone]. If 7PM-7AM, please contact night-coverage at [email or web address], password [facility] 07/26/2014, 12:00 PM  LOS: 1 day

## 2014-07-26 NOTE — Care Management Note (Signed)
  Page 1 of 1   07/26/2014     4:04:34 PM CARE MANAGEMENT NOTE 07/26/2014  Patient:  [name], [name]   Account Number:  [id number]  Date Initiated:  07/26/2014  Documentation initiated by:  Elissa Hefty  Subjective/Objective Assessment:   adm w     Action/Plan:   Anticipated DC Date:     Anticipated DC Plan:           Choice offered to / List presented to:             Status of service:   Medicare Important Message given?   (If response is "NO", the following Medicare IM given date fields will be blank) Date Medicare IM given:   Medicare IM given by:   Date Additional Medicare IM given:   Additional Medicare IM given by:    Discharge Disposition:    Per UR Regulation:    If discussed at Long Length of Stay Meetings, dates discussed:    Comments:

## 2014-07-27 LAB — STREP PNEUMONIAE URINARY ANTIGEN: Strep Pneumo Urinary Antigen: POSITIVE — AB

## 2014-07-27 LAB — COMPREHENSIVE METABOLIC PANEL
ALK PHOS: 76 U/L (ref 39–117)
ALT: 23 U/L (ref 0–35)
ANION GAP: 15 (ref 5–15)
AST: 41 U/L — ABNORMAL HIGH (ref 0–37)
Albumin: 2.7 g/dL — ABNORMAL LOW (ref 3.5–5.2)
BUN: 38 mg/dL — ABNORMAL HIGH (ref 6–23)
CO2: 24 mmol/L (ref 19–32)
Calcium: 8.3 mg/dL — ABNORMAL LOW (ref 8.4–10.5)
Chloride: 93 mmol/L — ABNORMAL LOW (ref 96–112)
Creatinine, Ser: 6.26 mg/dL — ABNORMAL HIGH (ref 0.50–1.10)
GFR calc non Af Amer: 7 mL/min — ABNORMAL LOW (ref >90)
GFR, EST AFRICAN AMERICAN: 8 mL/min — AB (ref >90)
GLUCOSE: 82 mg/dL (ref 70–99)
POTASSIUM: 3.6 mmol/L (ref 3.5–5.1)
Sodium: 132 mmol/L — ABNORMAL LOW (ref 135–145)
TOTAL PROTEIN: 6.6 g/dL (ref 6.0–8.3)
Total Bilirubin: 0.7 mg/dL (ref 0.3–1.2)

## 2014-07-27 LAB — URINALYSIS, ROUTINE W REFLEX MICROSCOPIC
Bilirubin Urine: NEGATIVE
Glucose, UA: 100 mg/dL — AB
Ketones, ur: NEGATIVE mg/dL
Leukocytes, UA: NEGATIVE
Nitrite: NEGATIVE
Protein, ur: >300 mg/dL — AB
SPECIFIC GRAVITY, URINE: 1.011 (ref 1.005–1.030)
UROBILINOGEN UA: 0.2 mg/dL (ref 0.0–1.0)
pH: 8.5 — ABNORMAL HIGH (ref 5.0–8.0)

## 2014-07-27 LAB — CBC
HEMATOCRIT: 29.8 % — AB (ref 36.0–46.0)
Hemoglobin: 9.7 g/dL — ABNORMAL LOW (ref 12.0–15.0)
MCH: 29.9 pg (ref 26.0–34.0)
MCHC: 32.6 g/dL (ref 30.0–36.0)
MCV: 92 fL (ref 78.0–100.0)
Platelets: 187 10*3/uL (ref 150–400)
RBC: 3.24 MIL/uL — ABNORMAL LOW (ref 3.87–5.11)
RDW: 15.5 % (ref 11.5–15.5)
WBC: 5.5 10*3/uL (ref 4.0–10.5)

## 2014-07-27 LAB — HAPTOGLOBIN: Haptoglobin: 147 mg/dL (ref 34–200)

## 2014-07-27 LAB — HIV ANTIBODY (ROUTINE TESTING W REFLEX): HIV Screen 4th Generation wRfx: NONREACTIVE

## 2014-07-27 LAB — URINE MICROSCOPIC-ADD ON

## 2014-07-27 MED ORDER — ONDANSETRON HCL 4 MG/2ML IJ SOLN
4.0000 mg | Freq: Four times a day (QID) | INTRAMUSCULAR | Status: DC | PRN
  Administered 2014-07-27: 4 mg via INTRAVENOUS
  Filled 2014-07-27: qty 2

## 2014-07-27 MED ORDER — LEVOFLOXACIN IN D5W 750 MG/150ML IV SOLN
750.0000 mg | Freq: Once | INTRAVENOUS | Status: AC
  Administered 2014-07-27: 750 mg via INTRAVENOUS
  Filled 2014-07-27: qty 150

## 2014-07-27 MED ORDER — OSELTAMIVIR PHOSPHATE 30 MG PO CAPS
30.0000 mg | ORAL_CAPSULE | ORAL | Status: AC
  Administered 2014-07-28 – 2014-07-30 (×2): 30 mg via ORAL
  Filled 2014-07-27 (×3): qty 1

## 2014-07-27 MED ORDER — LEVOFLOXACIN IN D5W 500 MG/100ML IV SOLN: 500.0000 mg | INTRAVENOUS | Status: DC

## 2014-07-27 NOTE — Progress Notes (Signed)
Pt nauseated and vomiting small amt. Paged Dr.Abrol for something for nausea.

## 2014-07-27 NOTE — Progress Notes (Signed)
[facility] PROGRESS NOTE  [name] [id number] DOB: Sep 20, 1960 DOA: 07/25/2014 PCP: No primary care provider on file.  Assessment/Plan: Principal Problem:   Flash pulmonary edema Active Problems:   End-stage renal disease on hemodialysis   Hypertensive urgency   Lupus (systemic lupus erythematosus)   DUB (dysfunctional uterine bleeding)   Anemia   Peptic ulcer disease   Stroke   Pulmonary edema    Flash pulmonary edema/influenza A/pneumonia  resolved after hemodialysis/influenza A Currently receiving Tamiflu with hemodialysis Status post emergent   hemodialysis 2-D echo shows EF of 55-60%, moderate mitral stenosis, pulmonary hypertension with a PA pressure of 65  CAP?Marland KitchenNilda Riggs pneumonia Discontinue vancomycin and Zosyn, chest x-ray negative, blood culture no growth so far Switch Solu-Medrol to prednisone by mouth Follow blood culture  Hypertensive urgency Discontinue nitroglycerin Improving blood pressure Currently receiving lisinopril, labetalol, Norvasc Continue when necessary hydralazine   Elevated troponin -Demand ischemia vs true MI  -Trend troponin; if continues to increase will consult cardiology 0.27 If no wall motion abnormalities on echo no further intervention is needed   End-stage renal disease on hemodialysis M/W/F Status post emergent hemodialysis;  Next hemodialysis tomorrow   Hyponatremia -Most likely secondary to her ESRD, however will check patient for CHF secondary to lupus -  Fever  Secondary to flu   LupusFlare? -Continue steroids  Anemia 1. no need for ESA now, Hb 9.6, resume Mircera on dc  Code Status: full Family Communication: family updated about patient's clinical progress Disposition Plan anticipate discharge home tomorrow    Brief narrative: [name] is a 54 y.o. BF PMHx stroke 2008, HTN, ESRD on HD M/W/F Lupus, DUB, anemia, PUD. Presents with cold symptoms including nonproductive cough that have  been ongoing for the last 2 days, acute SOB.[name] Has not missed her HD session. States base weight 64 kg postdialysis. Patient says he had an echocardiogram 6 months ago at [facility]. PCP is a Dr. Sharene Butters at [place]. States only Lupus symptoms have been her renal failure.  Consultants:  Nephrology  Procedures:  Hemodialysis  Antibiotics: Rocephin azithromycin  HPI/Subjective: Blood pressure has improved, patient is still congested and has a cough  Objective: Filed Vitals:   07/26/14 1856 07/26/14 2034 07/27/14 0537 07/27/14 1022  BP: 122/74 131/66 136/69 134/77  Pulse: 93 92 94 96  Temp: 98.2 F (36.8 C) 98.4 F (36.9 C) 98.5 F (36.9 C) 98.4 F (36.9 C)  TempSrc: Oral Oral Oral Oral  Resp: 22 18 16 17   Height: 5\' 6"  (1.676 m)     Weight: 63.866 kg (140 lb 12.8 oz)     SpO2: 99% 100% 99% 99%    Intake/Output Summary (Last 24 hours) at 07/27/14 1339 Last data filed at 07/27/14 1111  Gross per 24 hour  Intake    480 ml  Output      0 ml  Net    480 ml    Exam:  Chest occ wheezes L side, R clear  RRR no MRG Abd soft, NTND, no mass or ascites No LE or UE edema RUA AVF patent Neuro is nf, ox 3      Data Reviewed: Basic Metabolic Panel:  Recent Labs Lab 07/25/14 2140 07/25/14 2156 07/26/14 0047 07/26/14 0841 07/27/14 0635  NA 131* 133*  --  134* 132*  K 4.6 4.7  --  3.4* 3.6  CL 95* 97  --  97 93*  CO2 24  --   --  27 24  GLUCOSE 128* 130*  --  89 82  BUN 38* 44*  --  13 38*  CREATININE 8.34* 8.40* 8.92* 3.55* 6.26*  CALCIUM 9.2  --   --  8.5 8.3*  MG 2.0  --   --  1.9  --   PHOS 6.3*  --   --   --   --     Liver Function Tests:  Recent Labs Lab 07/25/14 2140 07/26/14 0841 07/27/14 0635  AST 33 34 41*  ALT 19 20 23   ALKPHOS 106 91 76  BILITOT 0.9 0.9 0.7  PROT 8.0 7.5 6.6  ALBUMIN 3.5 3.1* 2.7*   No results for input(s): LIPASE, AMYLASE in the last 168 hours. No results for input(s): AMMONIA in the last 168  hours.  CBC:  Recent Labs Lab 07/25/14 2140 07/25/14 2156 07/26/14 0047 07/26/14 0841 07/27/14 0635  WBC 12.7*  --  10.0 8.7 5.5  NEUTROABS 11.0*  --   --  7.9*  --   HGB 11.5* 13.6 9.6* 9.9* 9.7*  HCT 36.3 40.0 29.6* 30.4* 29.8*  MCV 95.3  --  93.7 93.5 92.0  PLT 201  --  167 192 187    Cardiac Enzymes:  Recent Labs Lab 07/25/14 2140 07/26/14 0841 07/26/14 1138  TROPONINI 0.06* 0.27* 0.31*   BNP (last 3 results)  Recent Labs  07/25/14 2140  BNP 2460.9*    ProBNP (last 3 results) No results for input(s): PROBNP in the last 8760 hours.    CBG: No results for input(s): GLUCAP in the last 168 hours.  Recent Results (from the past 240 hour(s))  Culture, blood (routine x 2)     Status: None (Preliminary result)   Collection Time: 07/25/14 10:07 PM  Result Value Ref Range Status   Specimen Description BLOOD LEFT FOREARM  Final   Special Requests BOTTLES DRAWN AEROBIC AND ANAEROBIC 10CC  Final   Culture   Final    GRAM POSITIVE COCCI IN CLUSTERS Note: Gram Stain Report Called to,Read Back By and Verified With: Margie Ege AT 11:46 P.M. ON 07/26/2014 WARRB Performed at [facility]    Report Status PENDING  Incomplete  Culture, blood (routine x 2)     Status: None (Preliminary result)   Collection Time: 07/25/14 10:10 PM  Result Value Ref Range Status   Specimen Description BLOOD LEFT HAND  Final   Special Requests BOTTLES DRAWN AEROBIC ONLY 10CC  Final   Culture   Final           BLOOD CULTURE RECEIVED NO GROWTH TO DATE CULTURE WILL BE HELD FOR 5 DAYS BEFORE ISSUING A FINAL NEGATIVE REPORT Performed at [facility]    Report Status PENDING  Incomplete  Culture, blood (routine x 2) Call MD if unable to obtain prior to antibiotics being given     Status: None (Preliminary result)   Collection Time: 07/26/14 12:37 AM  Result Value Ref Range Status   Specimen Description BLOOD LEFT WRIST  Final   Special Requests BOTTLES DRAWN AEROBIC AND  ANAEROBIC 5CC  Final   Culture   Final           BLOOD CULTURE RECEIVED NO GROWTH TO DATE CULTURE WILL BE HELD FOR 5 DAYS BEFORE ISSUING A FINAL NEGATIVE REPORT Performed at [facility]    Report Status PENDING  Incomplete  Culture, blood (routine x 2) Call MD if unable to obtain prior to antibiotics being given     Status: None (Preliminary result)   Collection Time: 07/26/14  12:37 AM  Result Value Ref Range Status   Specimen Description BLOOD LEFT HAND  Final   Special Requests BOTTLES DRAWN AEROBIC AND ANAEROBIC 5CC  Final   Culture   Final           BLOOD CULTURE RECEIVED NO GROWTH TO DATE CULTURE WILL BE HELD FOR 5 DAYS BEFORE ISSUING A FINAL NEGATIVE REPORT Performed at [facility]    Report Status PENDING  Incomplete  MRSA PCR Screening     Status: None   Collection Time: 07/26/14  6:20 AM  Result Value Ref Range Status   MRSA by PCR NEGATIVE NEGATIVE Final    Comment:        The GeneXpert MRSA Assay (FDA approved for NASAL specimens only), is one component of a comprehensive MRSA colonization surveillance program. It is not intended to diagnose MRSA infection nor to guide or monitor treatment for MRSA infections.      Studies: Dg Chest Port 1 View  07/26/2014   CLINICAL DATA:  Pulmonary edema.  EXAM: PORTABLE CHEST - 1 VIEW  COMPARISON:  07/25/2014  FINDINGS: Cardiac enlargement with pulmonary vascular congestion. Bilateral perihilar edema. Small bilateral pleural effusions, greater on the left. No significant change since previous study. No pneumothorax.  IMPRESSION: No active disease.   Electronically Signed   By: Lucienne Capers M.D.   On: 07/26/2014 00:51   Dg Chest Port 1 View  07/25/2014   CLINICAL DATA:  Shortness of breath.  EXAM: PORTABLE CHEST - 1 VIEW  COMPARISON:  Most recent chest radiographs 01/30/2006  FINDINGS: The heart is at the upper limits of normal in size. There is vascular congestion and pulmonary edema. Bilateral patchy lower lobe  airspace opacities, left greater than right. Probable left pleural effusion. Heart appears at the upper limits of normal in size. There is no pneumothorax. No acute osseous abnormalities are seen.  IMPRESSION: 1. Vascular congestion and pulmonary edema. Findings suggest fluid overload/CHF. 2. Bibasilar opacities, left greater than right. This may be vascular in etiology versus atelectasis or pneumonia. There is a probable left pleural effusion.   Electronically Signed   By: Jeb Levering M.D.   On: 07/25/2014 21:56    Scheduled Meds: . amLODipine  10 mg Oral Daily  . aspirin  81 mg Oral Daily  . heparin  5,000 Units Subcutaneous 3 times per day  . labetalol  300 mg Oral TID  . lisinopril  40 mg Oral Daily  . [START ON 07/28/2014] oseltamivir  30 mg Oral Q M,W,F-HD  . predniSONE  40 mg Oral Q breakfast  . sodium chloride  3 mL Intravenous Q12H   Continuous Infusions: . sodium chloride 10 mL/hr at 07/26/14 1100    Principal Problem:   Flash pulmonary edema Active Problems:   End-stage renal disease on hemodialysis   Hypertensive urgency   Lupus (systemic lupus erythematosus)   DUB (dysfunctional uterine bleeding)   Anemia   Peptic ulcer disease   Stroke   Pulmonary edema    Time spent: 40 minutes   [facility] Pager [phone]. If 7PM-7AM, please contact night-coverage at [email or web address], password [facility] 07/27/2014, 1:39 PM  LOS: 2 days

## 2014-07-27 NOTE — Progress Notes (Signed)
  [facility] Progress Note   Subjective: no c/o's, feeling a little better  Filed Vitals:   07/26/14 1856 07/26/14 2034 07/27/14 0537 07/27/14 1022  BP: 122/74 131/66 136/69 134/77  Pulse: 93 92 94 96  Temp: 98.2 F (36.8 C) 98.4 F (36.9 C) 98.5 F (36.9 C) 98.4 F (36.9 C)  TempSrc: Oral Oral Oral Oral  Resp: 22 18 16 17   Height: 5\' 6"  (1.676 m)     Weight: 63.866 kg (140 lb 12.8 oz)     SpO2: 99% 100% 99% 99%   Exam: Alert, no distress No jvd Chest occ wheezes L side, R clear  RRR no MRG Abd soft, NTND, no mass or ascites No LE or UE edema RUA AVF patent Neuro is nf, ox 3  HD: [facility] 3.5h   64kg   2/2.0 BAth   RUA AVF  Heparin 6400  Prof 2 Calcitriol 1 ug / hd,  Mircera 75 ug every 2 wk,  Venofer 50/wk       Assessment: 1. Hypoxemia / SOB / pulm edema - resolved 2. H1N1 influenza A - starting to feel better 3. HTN controlled on home meds now 4. Nausea/ vomiting - due to #2, better 5. Anemia no need for ESA now, Hb 9.6, resume Mircera on dc 6. MBD cont meds  Plan- HD first shift in am.     [name] MD  pager [phone]    cell [phone]  07/27/2014, 1:05 PM     Recent Labs Lab 07/25/14 2140 07/25/14 2156 07/26/14 0047 07/26/14 0841 07/27/14 0635  NA 131* 133*  --  134* 132*  K 4.6 4.7  --  3.4* 3.6  CL 95* 97  --  97 93*  CO2 24  --   --  27 24  GLUCOSE 128* 130*  --  89 82  BUN 38* 44*  --  13 38*  CREATININE 8.34* 8.40* 8.92* 3.55* 6.26*  CALCIUM 9.2  --   --  8.5 8.3*  PHOS 6.3*  --   --   --   --     Recent Labs Lab 07/25/14 2140 07/26/14 0841 07/27/14 0635  AST 33 34 41*  ALT 19 20 23   ALKPHOS 106 91 76  BILITOT 0.9 0.9 0.7  PROT 8.0 7.5 6.6  ALBUMIN 3.5 3.1* 2.7*    Recent Labs Lab 07/25/14 2140  07/26/14 0047 07/26/14 0841 07/27/14 0635  WBC 12.7*  --  10.0 8.7 5.5  NEUTROABS 11.0*  --   --  7.9*  --   HGB 11.5*  < > 9.6* 9.9* 9.7*  HCT 36.3  < > 29.6* 30.4* 29.8*  MCV 95.3  --  93.7 93.5 92.0   PLT 201  --  167 192 187  < > = values in this interval not displayed. [name] amLODipine  10 mg Oral Daily  . aspirin  81 mg Oral Daily  . heparin  5,000 Units Subcutaneous 3 times per day  . hydrALAZINE  10 mg Intravenous Q4H  . labetalol  300 mg Oral TID  . lisinopril  40 mg Oral Daily  . [START ON 07/28/2014] oseltamivir  30 mg Oral Q M,W,F-HD  . predniSONE  40 mg Oral Q breakfast  . sodium chloride  3 mL Intravenous Q12H   . sodium chloride 10 mL/hr at 07/26/14 1100   sodium chloride, acetaminophen, hydrALAZINE, ondansetron (ZOFRAN) IV, sodium chloride

## 2014-07-27 NOTE — Clinical Documentation Improvement (Signed)
Possible Clinical Conditions?  Acute Respiratory Failure Acute on Chronic Respiratory Failure Chronic Respiratory Failure Other Condition Cannot Clinically Determine   Supporting Information:(As per notes) Pt in Acute pulmonary edema with possible pneumonia. "Over the course of today she developed increased respiratory difficulty and presented to the emergency room with hypoxia requiring BiPAP." Sats dropped to 90 and pt placed on BIPAP. "General: A/O 4, acute respiratory distress on BiPAP"   Diagnostics: Blood Culture on 07-25-14= CULTURE:GRAM POSITIVE COCCI IN CLUSTERS  CXR 0n 07-25-14 IMPRESSION: 1. Vascular congestion and pulmonary edema. Findings suggest fluid overload/CHF. 2. Bibasilar opacities, left greater than right. This may be vascular in etiology versus atelectasis or pneumonia. There is a probable left pleural effusion. Treatment:  LEVAQUIN) IVPB 500 mg   [id number]       Ordered Dose: 500 mg Route: Intravenous Frequency: Every 48 hours    vancomycin (VANCOCIN) IVPB 750 mg/150 ml premix   [id number]       Ordered Dose: 750 mg Route: Intravenous Frequency: Every M-W-F   Thank You, Alessandra Grout, RN, BSN, CCDS,Clinical Documentation Specialist:  [phone]  [phone]=[facility]- Health Information Management

## 2014-07-28 LAB — CBC
HCT: 29.2 % — ABNORMAL LOW (ref 36.0–46.0)
HEMOGLOBIN: 9.5 g/dL — AB (ref 12.0–15.0)
MCH: 30 pg (ref 26.0–34.0)
MCHC: 32.5 g/dL (ref 30.0–36.0)
MCV: 92.1 fL (ref 78.0–100.0)
Platelets: 204 10*3/uL (ref 150–400)
RBC: 3.17 MIL/uL — ABNORMAL LOW (ref 3.87–5.11)
RDW: 15.3 % (ref 11.5–15.5)
WBC: 4.2 10*3/uL (ref 4.0–10.5)

## 2014-07-28 LAB — RESPIRATORY VIRUS PANEL
ADENOVIRUS: NEGATIVE
INFLUENZA A: POSITIVE — AB
Influenza B: NEGATIVE
Metapneumovirus: NEGATIVE
PARAINFLUENZA 1 A: NEGATIVE
PARAINFLUENZA 2 A: NEGATIVE
Parainfluenza 3: NEGATIVE
RESPIRATORY SYNCYTIAL VIRUS B: NEGATIVE
Respiratory Syncytial Virus A: NEGATIVE
Rhinovirus: NEGATIVE

## 2014-07-28 LAB — COMPREHENSIVE METABOLIC PANEL
ALT: 23 U/L (ref 0–35)
AST: 33 U/L (ref 0–37)
Albumin: 2.7 g/dL — ABNORMAL LOW (ref 3.5–5.2)
Alkaline Phosphatase: 67 U/L (ref 39–117)
Anion gap: 11 (ref 5–15)
BUN: 56 mg/dL — ABNORMAL HIGH (ref 6–23)
CALCIUM: 8.2 mg/dL — AB (ref 8.4–10.5)
CO2: 24 mmol/L (ref 19–32)
Chloride: 97 mmol/L (ref 96–112)
Creatinine, Ser: 8.45 mg/dL — ABNORMAL HIGH (ref 0.50–1.10)
GFR calc Af Amer: 6 mL/min — ABNORMAL LOW (ref >90)
GFR calc non Af Amer: 5 mL/min — ABNORMAL LOW (ref >90)
GLUCOSE: 91 mg/dL (ref 70–99)
POTASSIUM: 3.4 mmol/L — AB (ref 3.5–5.1)
SODIUM: 132 mmol/L — AB (ref 135–145)
Total Bilirubin: 0.5 mg/dL (ref 0.3–1.2)
Total Protein: 6.9 g/dL (ref 6.0–8.3)

## 2014-07-28 LAB — CULTURE, BLOOD (ROUTINE X 2)

## 2014-07-28 LAB — LEGIONELLA ANTIGEN, URINE

## 2014-07-28 LAB — CLOSTRIDIUM DIFFICILE BY PCR: Toxigenic C. Difficile by PCR: NEGATIVE

## 2014-07-28 MED ORDER — WHITE PETROLATUM GEL
Status: AC
Start: 2014-07-28 — End: 2014-07-28
  Administered 2014-07-28: 22:00:00
  Filled 2014-07-28: qty 1

## 2014-07-28 MED ORDER — INFLUENZA VAC SPLIT QUAD 0.5 ML IM SUSY
0.5000 mL | PREFILLED_SYRINGE | INTRAMUSCULAR | Status: AC
  Administered 2014-07-29: 0.5 mL via INTRAMUSCULAR
  Filled 2014-07-28 (×2): qty 0.5

## 2014-07-28 NOTE — Progress Notes (Addendum)
[facility] PROGRESS NOTE  [name] [id number] DOB: Feb 25, 1961 DOA: 07/25/2014 PCP: No primary care provider on file.  Assessment/Plan:  Flash pulmonary edema/influenza A/pneumonia  resolved after hemodialysis -influenza A-improving, continue Tamiflu with hemodialysis -2-D echo shows EF of 55-60%, moderate mitral stenosis, pulmonary hypertension with a PA pressure of 65 -blood culture 1/2, with coag negative staph, likely contaminant  Hypertensive urgency Improving  continue lisinopril, labetalol, Norvasc Continue PRN hydralazine  Demand ischemia - due to pulm edema/influenza -no wall motion abnormalities on echo -continue ASA, change labetalol to metoprolol -FU with Cards as outpatient  Chronic diarrhea for 6 months -reports colonoscopy 62months ago per dr.Schooler   End-stage renal disease on hemodialysis M/W/F -HD per Renal  Anemia -no need for ESA now, Hb 9.6, resume Mircera on dc  Code Status: full Family Communication: none at bedside Disposition Plan : home when stable   Brief narrative: [name] is a 54 y.o. BF PMHx stroke 2008, HTN, ESRD on HD M/W/F Lupus, DUB, anemia, PUD. Presents with cold symptoms including nonproductive cough that have been ongoing for the last 2 days, acute SOB.[name] Has not missed her HD session. States base weight 64 kg postdialysis. Patient says he had an echocardiogram 6 months ago at [facility]. PCP is a Dr. Sharene Butters at [facility]. States only Lupus symptoms have been her renal failure.  Consultants:  Nephrology  Procedures:  Hemodialysis  Antibiotics: Rocephin azithromycin  HPI/Subjective: Black stools x2 today  Objective: Filed Vitals:   07/28/14 1000 07/28/14 1030 07/28/14 1059 07/28/14 1156  BP: 130/69 146/84 155/82 151/75  Pulse: 84 83 84 84  Temp:   98.4 F (36.9 C) 98.9 F (37.2 C)  TempSrc:   Oral Oral  Resp:   20 20  Height:      Weight:   60.5 kg (133 lb 6.1 oz)   SpO2:    99%     Intake/Output Summary (Last 24 hours) at 07/28/14 1609 Last data filed at 07/28/14 1300  Gross per 24 hour  Intake    180 ml  Output   2062 ml  Net  -1882 ml    Exam: AAOx3, no distress Lungs: clear RRR no MRG Abd soft, NTND, no mass or ascites No LE or UE edema RUA AVF patent   Data Reviewed: Basic Metabolic Panel:  Recent Labs Lab 07/25/14 2140 07/25/14 2156 07/26/14 0047 07/26/14 0841 07/27/14 0635 07/28/14 0800  NA 131* 133*  --  134* 132* 132*  K 4.6 4.7  --  3.4* 3.6 3.4*  CL 95* 97  --  97 93* 97  CO2 24  --   --  27 24 24   GLUCOSE 128* 130*  --  89 82 91  BUN 38* 44*  --  13 38* 56*  CREATININE 8.34* 8.40* 8.92* 3.55* 6.26* 8.45*  CALCIUM 9.2  --   --  8.5 8.3* 8.2*  MG 2.0  --   --  1.9  --   --   PHOS 6.3*  --   --   --   --   --     Liver Function Tests:  Recent Labs Lab 07/25/14 2140 07/26/14 0841 07/27/14 0635 07/28/14 0800  AST 33 34 41* 33  ALT 19 20 23 23   ALKPHOS 106 91 76 67  BILITOT 0.9 0.9 0.7 0.5  PROT 8.0 7.5 6.6 6.9  ALBUMIN 3.5 3.1* 2.7* 2.7*   No results for input(s): LIPASE, AMYLASE in the last 168 hours. No results for input(s):  AMMONIA in the last 168 hours.  CBC:  Recent Labs Lab 07/25/14 2140 07/25/14 2156 07/26/14 0047 07/26/14 0841 07/27/14 0635 07/28/14 0800  WBC 12.7*  --  10.0 8.7 5.5 4.2  NEUTROABS 11.0*  --   --  7.9*  --   --   HGB 11.5* 13.6 9.6* 9.9* 9.7* 9.5*  HCT 36.3 40.0 29.6* 30.4* 29.8* 29.2*  MCV 95.3  --  93.7 93.5 92.0 92.1  PLT 201  --  167 192 187 204    Cardiac Enzymes:  Recent Labs Lab 07/25/14 2140 07/26/14 0841 07/26/14 1138  TROPONINI 0.06* 0.27* 0.31*   BNP (last 3 results)  Recent Labs  07/25/14 2140  BNP 2460.9*    ProBNP (last 3 results) No results for input(s): PROBNP in the last 8760 hours.    CBG: No results for input(s): GLUCAP in the last 168 hours.  Recent Results (from the past 240 hour(s))  Culture, blood (routine x 2)     Status: None    Collection Time: 07/25/14 10:07 PM  Result Value Ref Range Status   Specimen Description BLOOD LEFT FOREARM  Final   Special Requests BOTTLES DRAWN AEROBIC AND ANAEROBIC 10CC  Final   Culture   Final    STAPHYLOCOCCUS SPECIES (COAGULASE NEGATIVE) Note: THE SIGNIFICANCE OF ISOLATING THIS ORGANISM FROM A SINGLE SET OF BLOOD CULTURES WHEN MULTIPLE SETS ARE DRAWN IS UNCERTAIN. PLEASE NOTIFY THE MICROBIOLOGY DEPARTMENT WITHIN ONE WEEK IF SPECIATION AND SENSITIVITIES ARE REQUIRED. Note: Gram Stain Report Called to,Read Back By and Verified With: Margie Ege AT 11:46 P.M. ON 07/26/2014 WARRB Performed at [facility]    Report Status 07/28/2014 FINAL  Final  Culture, blood (routine x 2)     Status: None (Preliminary result)   Collection Time: 07/25/14 10:10 PM  Result Value Ref Range Status   Specimen Description BLOOD LEFT HAND  Final   Special Requests BOTTLES DRAWN AEROBIC ONLY 10CC  Final   Culture   Final           BLOOD CULTURE RECEIVED NO GROWTH TO DATE CULTURE WILL BE HELD FOR 5 DAYS BEFORE ISSUING A FINAL NEGATIVE REPORT Performed at [facility]    Report Status PENDING  Incomplete  Culture, blood (routine x 2) Call MD if unable to obtain prior to antibiotics being given     Status: None (Preliminary result)   Collection Time: 07/26/14 12:37 AM  Result Value Ref Range Status   Specimen Description BLOOD LEFT WRIST  Final   Special Requests BOTTLES DRAWN AEROBIC AND ANAEROBIC 5CC  Final   Culture   Final           BLOOD CULTURE RECEIVED NO GROWTH TO DATE CULTURE WILL BE HELD FOR 5 DAYS BEFORE ISSUING A FINAL NEGATIVE REPORT Performed at [facility]    Report Status PENDING  Incomplete  Culture, blood (routine x 2) Call MD if unable to obtain prior to antibiotics being given     Status: None (Preliminary result)   Collection Time: 07/26/14 12:37 AM  Result Value Ref Range Status   Specimen Description BLOOD LEFT HAND  Final   Special Requests BOTTLES DRAWN  AEROBIC AND ANAEROBIC 5CC  Final   Culture   Final           BLOOD CULTURE RECEIVED NO GROWTH TO DATE CULTURE WILL BE HELD FOR 5 DAYS BEFORE ISSUING A FINAL NEGATIVE REPORT Performed at [facility]    Report Status PENDING  Incomplete  MRSA PCR Screening     Status: None   Collection Time: 07/26/14  6:20 AM  Result Value Ref Range Status   MRSA by PCR NEGATIVE NEGATIVE Final    Comment:        The GeneXpert MRSA Assay (FDA approved for NASAL specimens only), is one component of a comprehensive MRSA colonization surveillance program. It is not intended to diagnose MRSA infection nor to guide or monitor treatment for MRSA infections.   Clostridium Difficile by PCR     Status: None   Collection Time: 07/27/14  7:35 PM  Result Value Ref Range Status   C difficile by pcr NEGATIVE NEGATIVE Final     Studies: Dg Chest Port 1 View  07/26/2014   CLINICAL DATA:  Pulmonary edema.  EXAM: PORTABLE CHEST - 1 VIEW  COMPARISON:  07/25/2014  FINDINGS: Cardiac enlargement with pulmonary vascular congestion. Bilateral perihilar edema. Small bilateral pleural effusions, greater on the left. No significant change since previous study. No pneumothorax.  IMPRESSION: No active disease.   Electronically Signed   By: Lucienne Capers M.D.   On: 07/26/2014 00:51   Dg Chest Port 1 View  07/25/2014   CLINICAL DATA:  Shortness of breath.  EXAM: PORTABLE CHEST - 1 VIEW  COMPARISON:  Most recent chest radiographs 01/30/2006  FINDINGS: The heart is at the upper limits of normal in size. There is vascular congestion and pulmonary edema. Bilateral patchy lower lobe airspace opacities, left greater than right. Probable left pleural effusion. Heart appears at the upper limits of normal in size. There is no pneumothorax. No acute osseous abnormalities are seen.  IMPRESSION: 1. Vascular congestion and pulmonary edema. Findings suggest fluid overload/CHF. 2. Bibasilar opacities, left greater than right. This may be  vascular in etiology versus atelectasis or pneumonia. There is a probable left pleural effusion.   Electronically Signed   By: Jeb Levering M.D.   On: 07/25/2014 21:56    Scheduled Meds: . amLODipine  10 mg Oral Daily  . aspirin  81 mg Oral Daily  . heparin  5,000 Units Subcutaneous 3 times per day  . labetalol  300 mg Oral TID  . lisinopril  40 mg Oral Daily  . oseltamivir  30 mg Oral Q M,W,F-HD  . predniSONE  40 mg Oral Q breakfast  . sodium chloride  3 mL Intravenous Q12H   Continuous Infusions: . sodium chloride 10 mL/hr at 07/26/14 1100    Principal Problem:   Flash pulmonary edema Active Problems:   End-stage renal disease on hemodialysis   Hypertensive urgency   Lupus (systemic lupus erythematosus)   DUB (dysfunctional uterine bleeding)   Anemia   Peptic ulcer disease   Stroke   Pulmonary edema    Time spent: 30 minutes   [facility]  [facility] Pager [phone]. If 7PM-7AM, please contact night-coverage at [email or web address], password [facility] 07/28/2014, 4:09 PM  LOS: 3 days

## 2014-07-28 NOTE — Care Management Note (Signed)
CARE MANAGEMENT NOTE 07/28/2014  Patient:  MARIAMAWIT, LACROIX   Account Number:  [id number]  Date Initiated:  07/26/2014  Documentation initiated by:  Elissa Hefty  Subjective/Objective Assessment:   CM following for progression and d/c planning.     Action/Plan:   07/28/14  No d/c needs identified at this time.   Anticipated DC Date:  07/28/2014   Anticipated DC Plan:  HOME/SELF CARE         Choice offered to / List presented to:             Status of service:  Completed, signed off Medicare Important Message given?  YES (If response is "NO", the following Medicare IM given date fields will be blank) Date Medicare IM given:  07/28/2014 Medicare IM given by:  Josanna Hefel Date Additional Medicare IM given:   Additional Medicare IM given by:    Discharge Disposition:  HOME/SELF CARE  Per UR Regulation:    If discussed at Long Length of Stay Meetings, dates discussed:    Comments:

## 2014-07-28 NOTE — Progress Notes (Signed)
  [facility] Progress Note   Subjective: no c/o's, feeling a little better. Still loose stools, she says for last 5 mos since colonscopy  Filed Vitals:   07/28/14 0719 07/28/14 0726 07/28/14 0800 07/28/14 0830  BP: 167/84 155/92 170/93 169/94  Pulse: 84 79 77 73  Temp: 97 F (36.1 C)     TempSrc: Oral     Resp: 20     Height:      Weight: 62.8 kg (138 lb 7.2 oz)     SpO2: 100%      Exam: Alert, no distress No jvd Chest clear bilat RRR no MRG Abd soft, NTND, no mass or ascites No LE or UE edema RUA AVF patent Neuro is nf, ox 3  HD: [facility] 3.5[facility]   64kg   2/2.0 BAth   RUA AVF  Heparin 6400  Prof 2 Calcitriol 1 ug / hd,  Mircera 75 ug every 2 wk,  Venofer 50/wk       Assessment: 1. Hypoxemia / SOB / pulm edema - resolved 2. Diarrhea - chronic per pt, Cdif pending 3. Bacteremia - 1/4 + for GPC, may be contaminant; abx stopped yest 4. H1N1 influenza A - starting to feel better, temps down now 5. HTN controlled on home meds now 6. Nausea/ vomiting - due to #4, better 7. Anemia no need for ESA now, Hb 9.6, resume Mircera on dc 8. MBD cont meds  Plan- HD today, minimal UF    [name] MD  pager [phone]    cell [phone]  07/28/2014, 9:18 AM     Recent Labs Lab 07/25/14 2140  07/26/14 0841 07/27/14 0635 07/28/14 0800  NA 131*  < > 134* 132* 132*  K 4.6  < > 3.4* 3.6 3.4*  CL 95*  < > 97 93* 97  CO2 24  --  27 24 24   GLUCOSE 128*  < > 89 82 91  BUN 38*  < > 13 38* 56*  CREATININE 8.34*  < > 3.55* 6.26* 8.45*  CALCIUM 9.2  --  8.5 8.3* 8.2*  PHOS 6.3*  --   --   --   --   < > = values in this interval not displayed.  Recent Labs Lab 07/26/14 0841 07/27/14 0635 07/28/14 0800  AST 34 41* 33  ALT 20 23 23   ALKPHOS 91 76 67  BILITOT 0.9 0.7 0.5  PROT 7.5 6.6 6.9  ALBUMIN 3.1* 2.7* 2.7*    Recent Labs Lab 07/25/14 2140  07/26/14 0841 07/27/14 0635 07/28/14 0800  WBC 12.7*  < > 8.7 5.5 4.2  NEUTROABS 11.0*  --  7.9*  --   --    HGB 11.5*  < > 9.9* 9.7* 9.5*  HCT 36.3  < > 30.4* 29.8* 29.2*  MCV 95.3  < > 93.5 92.0 92.1  PLT 201  < > 192 187 204  < > = values in this interval not displayed. [name] amLODipine  10 mg Oral Daily  . aspirin  81 mg Oral Daily  . heparin  5,000 Units Subcutaneous 3 times per day  . labetalol  300 mg Oral TID  . lisinopril  40 mg Oral Daily  . oseltamivir  30 mg Oral Q M,W,F-HD  . predniSONE  40 mg Oral Q breakfast  . sodium chloride  3 mL Intravenous Q12H   . sodium chloride 10 mL/hr at 07/26/14 1100   sodium chloride, acetaminophen, hydrALAZINE, ondansetron (ZOFRAN) IV, sodium chloride

## 2014-07-28 NOTE — Progress Notes (Signed)
TR note:  bipap is an old ed order.

## 2014-07-29 DIAGNOSIS — J810 Acute pulmonary edema: Secondary | ICD-10-CM | POA: Diagnosis not present

## 2014-07-29 LAB — OCCULT BLOOD X 1 CARD TO LAB, STOOL: FECAL OCCULT BLD: POSITIVE — AB

## 2014-07-29 MED ORDER — PANTOPRAZOLE SODIUM 40 MG IV SOLR
40.0000 mg | Freq: Two times a day (BID) | INTRAVENOUS | Status: DC
  Administered 2014-07-29 – 2014-08-01 (×6): 40 mg via INTRAVENOUS
  Filled 2014-07-29 (×7): qty 40

## 2014-07-29 MED ORDER — METOPROLOL TARTRATE 25 MG PO TABS
25.0000 mg | ORAL_TABLET | Freq: Two times a day (BID) | ORAL | Status: DC
  Administered 2014-07-29 – 2014-08-01 (×7): 25 mg via ORAL
  Filled 2014-07-29 (×8): qty 1

## 2014-07-29 NOTE — Progress Notes (Addendum)
[facility] PROGRESS NOTE  [name] [id number] DOB: 1960/07/13 DOA: 07/25/2014 PCP: No primary care provider on file.  Assessment/Plan:  Acute Hypoxic resp failure -due to Flash pulmonary edema/influenza A pneumonia -improved after hemodialysis -influenza A-improving, continue Tamiflu with hemodialysis -2-D echo shows EF of 55-60%, moderate mitral stenosis, pulmonary hypertension with a PA pressure of 65 -blood culture 1/2, with coag negative staph, likely contaminant, stopped empiric abx  Hypertensive urgency Improving  continue lisinopril, labetalol, Norvasc Continue PRN hydralazine  Demand ischemia - due to pulm edema/influenza -no wall motion abnormalities on echo -continue ASA, change labetalol to metoprolol -FU with Cards as outpatient  Melena with Chronic diarrhea for 6 months -reports colonoscopy 77months ago per dr.Schooler  -now with dark stools, add PPI -stop heparin SQ -d/w Dr.HAyes, GI will see in am, repeat CBC in am, possible EGD tomorrow  End-stage renal disease on hemodialysis M/W/F -HD per Renal  Anemia -no need for ESA now, Hb 9.6, resume Mircera on dc  Code Status: full Family Communication: none at bedside Disposition Plan : home when stable   Brief narrative: [name] is a 54 y.o. BF PMHx stroke 2008, HTN, ESRD on HD M/W/F Lupus, DUB, anemia, PUD. Presents with cold symptoms including nonproductive cough that have been ongoing for the last 2 days, acute SOB.[name] Has not missed her HD session. States base weight 64 kg postdialysis. Patient says he had an echocardiogram 6 months ago at [facility]. PCP is a Dr. Sharene [name] at [facility]. States only Lupus symptoms have been her renal failure.  Consultants:  Nephrology  Procedures:  Hemodialysis  Antibiotics: Rocephin azithromycin  HPI/Subjective: Black stools x2 today  Objective: Filed Vitals:   07/28/14 1701 07/28/14 2018 07/29/14 0449 07/29/14 0849  BP: 144/77 153/84  159/86 167/83  Pulse: 81 80 77 79  Temp: 99 F (37.2 C) 98.4 F (36.9 C) 98.2 F (36.8 C) 97.7 F (36.5 C)  TempSrc: Oral Oral Oral Oral  Resp: 19 20 22 20   Height:      Weight:  60.102 kg (132 lb 8 oz)    SpO2: 96% 97% 100% 96%    Intake/Output Summary (Last 24 hours) at 07/29/14 1541 Last data filed at 07/29/14 1413  Gross per 24 hour  Intake    960 ml  Output    200 ml  Net    760 ml    Exam: AAOx3, no distress Lungs: clear RRR no MRG Abd soft, NTND, no mass or ascites No LE or UE edema RUA AVF patent   Data Reviewed: Basic Metabolic Panel:  Recent Labs Lab 07/25/14 2140 07/25/14 2156 07/26/14 0047 07/26/14 0841 07/27/14 0635 07/28/14 0800  NA 131* 133*  --  134* 132* 132*  K 4.6 4.7  --  3.4* 3.6 3.4*  CL 95* 97  --  97 93* 97  CO2 24  --   --  27 24 24   GLUCOSE 128* 130*  --  89 82 91  BUN 38* 44*  --  13 38* 56*  CREATININE 8.34* 8.40* 8.92* 3.55* 6.26* 8.45*  CALCIUM 9.2  --   --  8.5 8.3* 8.2*  MG 2.0  --   --  1.9  --   --   PHOS 6.3*  --   --   --   --   --     Liver Function Tests:  Recent Labs Lab 07/25/14 2140 07/26/14 0841 07/27/14 0635 07/28/14 0800  AST 33 34 41* 33  ALT 19 20  23 23  ALKPHOS 106 91 76 67  BILITOT 0.9 0.9 0.7 0.5  PROT 8.0 7.5 6.6 6.9  ALBUMIN 3.5 3.1* 2.7* 2.7*   No results for input(s): LIPASE, AMYLASE in the last 168 hours. No results for input(s): AMMONIA in the last 168 hours.  CBC:  Recent Labs Lab 07/25/14 2140 07/25/14 2156 07/26/14 0047 07/26/14 0841 07/27/14 0635 07/28/14 0800  WBC 12.7*  --  10.0 8.7 5.5 4.2  NEUTROABS 11.0*  --   --  7.9*  --   --   HGB 11.5* 13.6 9.6* 9.9* 9.7* 9.5*  HCT 36.3 40.0 29.6* 30.4* 29.8* 29.2*  MCV 95.3  --  93.7 93.5 92.0 92.1  PLT 201  --  167 192 187 204    Cardiac Enzymes:  Recent Labs Lab 07/25/14 2140 07/26/14 0841 07/26/14 1138  TROPONINI 0.06* 0.27* 0.31*   BNP (last 3 results)  Recent Labs  07/25/14 2140  BNP 2460.9*    ProBNP  (last 3 results) No results for input(s): PROBNP in the last 8760 hours.    CBG: No results for input(s): GLUCAP in the last 168 hours.  Recent Results (from the past 240 hour(s))  Culture, blood (routine x 2)     Status: None   Collection Time: 07/25/14 10:07 PM  Result Value Ref Range Status   Specimen Description BLOOD LEFT FOREARM  Final   Special Requests BOTTLES DRAWN AEROBIC AND ANAEROBIC 10CC  Final   Culture   Final    STAPHYLOCOCCUS SPECIES (COAGULASE NEGATIVE) Note: THE SIGNIFICANCE OF ISOLATING THIS ORGANISM FROM A SINGLE SET OF BLOOD CULTURES WHEN MULTIPLE SETS ARE DRAWN IS UNCERTAIN. PLEASE NOTIFY THE MICROBIOLOGY DEPARTMENT WITHIN ONE WEEK IF SPECIATION AND SENSITIVITIES ARE REQUIRED. Note: Gram Stain Report Called to,Read Back By and Verified With: Margie Ege AT 11:46 P.M. ON 07/26/2014 WARRB Performed at [facility]    Report Status 07/28/2014 FINAL  Final  Culture, blood (routine x 2)     Status: None (Preliminary result)   Collection Time: 07/25/14 10:10 PM  Result Value Ref Range Status   Specimen Description BLOOD LEFT HAND  Final   Special Requests BOTTLES DRAWN AEROBIC ONLY 10CC  Final   Culture   Final           BLOOD CULTURE RECEIVED NO GROWTH TO DATE CULTURE WILL BE HELD FOR 5 DAYS BEFORE ISSUING A FINAL NEGATIVE REPORT Performed at [facility]    Report Status PENDING  Incomplete  Culture, blood (routine x 2) Call MD if unable to obtain prior to antibiotics being given     Status: None (Preliminary result)   Collection Time: 07/26/14 12:37 AM  Result Value Ref Range Status   Specimen Description BLOOD LEFT WRIST  Final   Special Requests BOTTLES DRAWN AEROBIC AND ANAEROBIC 5CC  Final   Culture   Final           BLOOD CULTURE RECEIVED NO GROWTH TO DATE CULTURE WILL BE HELD FOR 5 DAYS BEFORE ISSUING A FINAL NEGATIVE REPORT Performed at [facility]    Report Status PENDING  Incomplete  Culture, blood (routine x 2) Call MD if  unable to obtain prior to antibiotics being given     Status: None (Preliminary result)   Collection Time: 07/26/14 12:37 AM  Result Value Ref Range Status   Specimen Description BLOOD LEFT HAND  Final   Special Requests BOTTLES DRAWN AEROBIC AND ANAEROBIC 5CC  Final   Culture  Final           BLOOD CULTURE RECEIVED NO GROWTH TO DATE CULTURE WILL BE HELD FOR 5 DAYS BEFORE ISSUING A FINAL NEGATIVE REPORT Performed at [facility]    Report Status PENDING  Incomplete  MRSA PCR Screening     Status: None   Collection Time: 07/26/14  6:20 AM  Result Value Ref Range Status   MRSA by PCR NEGATIVE NEGATIVE Final    Comment:        The GeneXpert MRSA Assay (FDA approved for NASAL specimens only), is one component of a comprehensive MRSA colonization surveillance program. It is not intended to diagnose MRSA infection nor to guide or monitor treatment for MRSA infections.   Respiratory virus panel (routine influenza)     Status: Abnormal   Collection Time: 07/26/14 10:47 AM  Result Value Ref Range Status   Source - RVPAN NASS  Corrected   Respiratory Syncytial Virus A Negative Negative Final   Respiratory Syncytial Virus B Negative Negative Final   Influenza A Positive (A) Negative Final   Influenza B Negative Negative Final   Parainfluenza 1 Negative Negative Final   Parainfluenza 2 Negative Negative Final   Parainfluenza 3 Negative Negative Final   Metapneumovirus Negative Negative Final   Rhinovirus Negative Negative Final   Adenovirus Negative Negative Final    Comment: (NOTE) Performed At: [facility] [place], [place] [id number] Lindon Romp MD [id number]   Clostridium Difficile by PCR     Status: None   Collection Time: 07/27/14  7:35 PM  Result Value Ref Range Status   C difficile by pcr NEGATIVE NEGATIVE Final     Studies: Dg Chest Port 1 View  07/26/2014   CLINICAL DATA:  Pulmonary edema.  EXAM: PORTABLE CHEST - 1 VIEW   COMPARISON:  07/25/2014  FINDINGS: Cardiac enlargement with pulmonary vascular congestion. Bilateral perihilar edema. Small bilateral pleural effusions, greater on the left. No significant change since previous study. No pneumothorax.  IMPRESSION: No active disease.   Electronically Signed   By: Lucienne Capers M.D.   On: 07/26/2014 00:51   Dg Chest Port 1 View  07/25/2014   CLINICAL DATA:  Shortness of breath.  EXAM: PORTABLE CHEST - 1 VIEW  COMPARISON:  Most recent chest radiographs 01/30/2006  FINDINGS: The heart is at the upper limits of normal in size. There is vascular congestion and pulmonary edema. Bilateral patchy lower lobe airspace opacities, left greater than right. Probable left pleural effusion. Heart appears at the upper limits of normal in size. There is no pneumothorax. No acute osseous abnormalities are seen.  IMPRESSION: 1. Vascular congestion and pulmonary edema. Findings suggest fluid overload/CHF. 2. Bibasilar opacities, left greater than right. This may be vascular in etiology versus atelectasis or pneumonia. There is a probable left pleural effusion.   Electronically Signed   By: Jeb Levering M.D.   On: 07/25/2014 21:56    Scheduled Meds: . amLODipine  10 mg Oral Daily  . aspirin  81 mg Oral Daily  . Influenza vac split quadrivalent PF  0.5 mL Intramuscular Tomorrow-1000  . lisinopril  40 mg Oral Daily  . metoprolol tartrate  25 mg Oral BID  . oseltamivir  30 mg Oral Q M,W,F-HD  . pantoprazole (PROTONIX) IV  40 mg Intravenous Q12H  . sodium chloride  3 mL Intravenous Q12H   Continuous Infusions: . sodium chloride 10 mL/hr at 07/26/14 1100    Principal Problem:   Flash  pulmonary edema Active Problems:   End-stage renal disease on hemodialysis   Hypertensive urgency   Lupus (systemic lupus erythematosus)   DUB (dysfunctional uterine bleeding)   Anemia   Peptic ulcer disease   Stroke   Pulmonary edema    Time spent: 30 minutes   [facility]  [facility] Pager [phone]. If 7PM-7AM, please contact night-coverage at [email or web address], password [facility] 07/29/2014, 3:41 PM  LOS: 4 days

## 2014-07-29 NOTE — Progress Notes (Signed)
[facility] Progress Note   Subjective: having dark stools  Filed Vitals:   07/28/14 1701 07/28/14 2018 07/29/14 0449 07/29/14 0849  BP: 144/77 153/84 159/86 167/83  Pulse: 81 80 77 79  Temp: 99 F (37.2 C) 98.4 F (36.9 C) 98.2 F (36.8 C) 97.7 F (36.5 C)  TempSrc: Oral Oral Oral Oral  Resp: 19 20 22 20   Height:      Weight:  60.102 kg (132 lb 8 oz)    SpO2: 96% 97% 100% 96%   Exam: Alert, no distress No jvd Chest clear bilat RRR no MRG Abd soft, NTND, no mass or ascites No LE or UE edema RUA AVF patent Neuro is nf, ox 3  HD: [facility] 3.5h   64kg   2/2.0 BAth   RUA AVF  Heparin 6400  Prof 2 Calcitriol 1 ug / hd,  Mircera 75 ug every 2 wk,  Venofer 50/wk       Assessment: 1. Hypoxemia / SOB / pulm edema - resolved 2. Dark stools- per primary 3. Diarrhea - chronic per pt, Cdif pending 4. Bacteremia - 1/4 + for GPC, may be contaminant; abx stopped yest 5. H1N1 influenza A - starting to feel better, temps down now 6. HTN controlled on home meds now 7. Nausea/ vomiting - due to #4, better 8. Anemia no need for ESA now, Hb 9.6, resume Mircera on dc 9. MBD cont meds  Plan-  Poss dc today    [name] MD  pager [phone]    cell [phone]  07/29/2014, 1:26 PM     Recent Labs Lab 07/25/14 2140  07/26/14 0841 07/27/14 0635 07/28/14 0800  NA 131*  < > 134* 132* 132*  K 4.6  < > 3.4* 3.6 3.4*  CL 95*  < > 97 93* 97  CO2 24  --  27 24 24   GLUCOSE 128*  < > 89 82 91  BUN 38*  < > 13 38* 56*  CREATININE 8.34*  < > 3.55* 6.26* 8.45*  CALCIUM 9.2  --  8.5 8.3* 8.2*  PHOS 6.3*  --   --   --   --   < > = values in this interval not displayed.  Recent Labs Lab 07/26/14 0841 07/27/14 0635 07/28/14 0800  AST 34 41* 33  ALT 20 23 23   ALKPHOS 91 76 67  BILITOT 0.9 0.7 0.5  PROT 7.5 6.6 6.9  ALBUMIN 3.1* 2.7* 2.7*    Recent Labs Lab 07/25/14 2140  07/26/14 0841 07/27/14 0635 07/28/14 0800  WBC 12.7*  < > 8.7 5.5 4.2  NEUTROABS  11.0*  --  7.9*  --   --   HGB 11.5*  < > 9.9* 9.7* 9.5*  HCT 36.3  < > 30.4* 29.8* 29.2*  MCV 95.3  < > 93.5 92.0 92.1  PLT 201  < > 192 187 204  < > = values in this interval not displayed. [name] amLODipine  10 mg Oral Daily  . aspirin  81 mg Oral Daily  . heparin  5,000 Units Subcutaneous 3 times per day  . Influenza vac split quadrivalent PF  0.5 mL Intramuscular Tomorrow-1000  . lisinopril  40 mg Oral Daily  . metoprolol tartrate  25 mg Oral BID  . oseltamivir  30 mg Oral Q M,W,F-HD  . sodium chloride  3 mL Intravenous Q12H   . sodium chloride 10 mL/hr at 07/26/14 1100   sodium chloride, acetaminophen, hydrALAZINE, ondansetron (ZOFRAN) IV, sodium  chloride

## 2014-07-30 LAB — BASIC METABOLIC PANEL
Anion gap: 12 (ref 5–15)
BUN: 45 mg/dL — AB (ref 6–23)
CHLORIDE: 99 mmol/L (ref 96–112)
CO2: 24 mmol/L (ref 19–32)
Calcium: 8.5 mg/dL (ref 8.4–10.5)
Creatinine, Ser: 7.89 mg/dL — ABNORMAL HIGH (ref 0.50–1.10)
GFR calc Af Amer: 6 mL/min — ABNORMAL LOW (ref >90)
GFR, EST NON AFRICAN AMERICAN: 5 mL/min — AB (ref >90)
Glucose, Bld: 86 mg/dL (ref 70–99)
Potassium: 4 mmol/L (ref 3.5–5.1)
Sodium: 135 mmol/L (ref 135–145)

## 2014-07-30 LAB — CBC
HCT: 32.1 % — ABNORMAL LOW (ref 36.0–46.0)
Hemoglobin: 10.3 g/dL — ABNORMAL LOW (ref 12.0–15.0)
MCH: 29.8 pg (ref 26.0–34.0)
MCHC: 32.1 g/dL (ref 30.0–36.0)
MCV: 92.8 fL (ref 78.0–100.0)
PLATELETS: 236 10*3/uL (ref 150–400)
RBC: 3.46 MIL/uL — ABNORMAL LOW (ref 3.87–5.11)
RDW: 15.1 % (ref 11.5–15.5)
WBC: 4.7 10*3/uL (ref 4.0–10.5)

## 2014-07-30 LAB — RENAL FUNCTION PANEL
ANION GAP: 14 (ref 5–15)
Albumin: 2.9 g/dL — ABNORMAL LOW (ref 3.5–5.2)
BUN: 46 mg/dL — AB (ref 6–23)
CO2: 22 mmol/L (ref 19–32)
Calcium: 8.7 mg/dL (ref 8.4–10.5)
Chloride: 99 mmol/L (ref 96–112)
Creatinine, Ser: 8.13 mg/dL — ABNORMAL HIGH (ref 0.50–1.10)
GFR calc non Af Amer: 5 mL/min — ABNORMAL LOW (ref >90)
GFR, EST AFRICAN AMERICAN: 6 mL/min — AB (ref >90)
GLUCOSE: 90 mg/dL (ref 70–99)
PHOSPHORUS: 5.4 mg/dL — AB (ref 2.3–4.6)
POTASSIUM: 4.1 mmol/L (ref 3.5–5.1)
SODIUM: 135 mmol/L (ref 135–145)

## 2014-07-30 MED ORDER — SODIUM CHLORIDE 0.9 % IV SOLN: 100.0000 mL | INTRAVENOUS | Status: DC | PRN

## 2014-07-30 MED ORDER — HEPARIN SODIUM (PORCINE) 1000 UNIT/ML DIALYSIS: 1000.0000 [IU] | INTRAMUSCULAR | Status: DC | PRN

## 2014-07-30 MED ORDER — LIDOCAINE HCL (PF) 1 % IJ SOLN: 5.0000 mL | INTRAMUSCULAR | Status: DC | PRN

## 2014-07-30 MED ORDER — ALTEPLASE 2 MG IJ SOLR: 2.0000 mg | Freq: Once | INTRAMUSCULAR | Status: DC | PRN

## 2014-07-30 MED ORDER — NEPRO/CARBSTEADY PO LIQD
237.0000 mL | ORAL | Status: DC | PRN
  Filled 2014-07-30: qty 237

## 2014-07-30 MED ORDER — NEPRO/CARBSTEADY PO LIQD: 237.0000 mL | ORAL | Status: DC | PRN

## 2014-07-30 MED ORDER — LIDOCAINE-PRILOCAINE 2.5-2.5 % EX CREA
1.0000 "application " | TOPICAL_CREAM | CUTANEOUS | Status: DC | PRN
  Filled 2014-07-30: qty 5

## 2014-07-30 MED ORDER — ALTEPLASE 2 MG IJ SOLR
2.0000 mg | Freq: Once | INTRAMUSCULAR | Status: DC | PRN
  Filled 2014-07-30: qty 2

## 2014-07-30 MED ORDER — ACETAMINOPHEN 325 MG PO TABS
ORAL_TABLET | ORAL | Status: AC
  Filled 2014-07-30: qty 2

## 2014-07-30 MED ORDER — PENTAFLUOROPROP-TETRAFLUOROETH EX AERO: 1.0000 "application " | INHALATION_SPRAY | CUTANEOUS | Status: DC | PRN

## 2014-07-30 MED ORDER — LIDOCAINE-PRILOCAINE 2.5-2.5 % EX CREA: 1.0000 "application " | TOPICAL_CREAM | CUTANEOUS | Status: DC | PRN

## 2014-07-30 MED ORDER — HEPARIN SODIUM (PORCINE) 1000 UNIT/ML DIALYSIS: 6000.0000 [IU] | Freq: Once | INTRAMUSCULAR | Status: DC

## 2014-07-30 MED ORDER — CALCITRIOL 0.5 MCG PO CAPS
1.0000 ug | ORAL_CAPSULE | Freq: Every day | ORAL | Status: DC
  Administered 2014-07-30 – 2014-08-01 (×3): 1 ug via ORAL
  Filled 2014-07-30 (×3): qty 2

## 2014-07-30 NOTE — Consult Note (Signed)
[facility] CONSULT Reason for consult: G.I. bleeding  Referring Physician: [facility]. Renal. PCP: Dr. Vista Lawman. Primary G.I.: Dr. Concepcion Elk Loy is an 54 y.o. female.  HPI: followed by Dr. Michail Sermon is outpatient. Colonoscopy last year revealed for polyps were adenomatous two-year repeat exam recommended. She had hemorrhoids and diverticulosis. Was admitted with passage of dark stools. Had some infiltrates on her chest x-ray thought she may have the flu. Hemoglobin 9.5, stool positive for FOB. Patient denies NSAIDs. She has been taking ranitidine for reflux. Dr. Michail Sermon started her on omeprazole for unclear reasons she went back to taking ranitidine on a PRN basis several times weekly due to symptoms of dyspepsia. Currently on protonix Q 12. She is just finishing dialysis and is NPO.  Past Medical History  Diagnosis Date  . Hypertension   . Blood transfusion '08    [facility]  . GERD (gastroesophageal reflux disease)   . Chronic kidney disease     dialysis   Mon Wed Fri  . Lupus   . Dysfunctional uterine bleeding 12/19/2010  . Anemia   . Peptic ulcer disease   . Hx of hiatal hernia     Past Surgical History  Procedure Laterality Date  . Dialysis fistula creation  2007    History reviewed. No pertinent family history.  Social History:  reports that she has been smoking.  She does not have any smokeless tobacco history on file. She reports that she drinks alcohol. She reports that she uses illicit drugs (Marijuana).  Allergies: No Known Allergies  Medications; Prior to Admission medications   Medication Sig Start Date End Date Taking? Authorizing Provider  amLODipine (NORVASC) 10 MG tablet Take 10 mg by mouth daily.     Yes Historical Provider, MD  calcium acetate (PHOSLO) 667 MG capsule Take 667 mg by mouth 2 (two) times daily.     Yes Historical Provider, MD  labetalol (NORMODYNE) 300 MG tablet Take 300 mg by mouth daily.     Yes Historical Provider, MD   lisinopril (PRINIVIL,ZESTRIL) 20 MG tablet Take 40 mg by mouth daily.    Yes Historical Provider, MD  promethazine (PHENERGAN) 25 MG tablet Take 25 mg by mouth every 6 (six) hours as needed for nausea.   Yes Historical Provider, MD  b complex-vitamin c-folic acid (NEPHRO-VITE) 0.8 MG TABS tablet Take 0.8 mg by mouth at bedtime.    Historical Provider, MD  ranitidine (ZANTAC) 150 MG tablet Take 150 mg by mouth 2 (two) times daily.      Historical Provider, MD  simvastatin (ZOCOR) 40 MG tablet Take 40 mg by mouth every evening.    Historical Provider, MD   . acetaminophen      . amLODipine  10 mg Oral Daily  . aspirin  81 mg Oral Daily  . calcitRIOL  1 mcg Oral Daily  . [START ON 07/31/2014] heparin  6,000 Units Dialysis Once in dialysis  . lisinopril  40 mg Oral Daily  . metoprolol tartrate  25 mg Oral BID  . oseltamivir  30 mg Oral Q M,W,F-HD  . pantoprazole (PROTONIX) IV  40 mg Intravenous Q12H  . sodium chloride  3 mL Intravenous Q12H   PRN Meds sodium chloride, sodium chloride, sodium chloride, sodium chloride, sodium chloride, acetaminophen, alteplase, alteplase, feeding supplement (NEPRO CARB STEADY), feeding supplement (NEPRO CARB STEADY), heparin, heparin, hydrALAZINE, lidocaine (PF), lidocaine (PF), lidocaine-prilocaine, lidocaine-prilocaine, ondansetron (ZOFRAN) IV, pentafluoroprop-tetrafluoroeth, pentafluoroprop-tetrafluoroeth, sodium chloride Results for orders placed or performed during the hospital encounter of  07/25/14 (from the past 48 hour(s))  Occult blood card to lab, stool RN will collect     Status: Abnormal   Collection Time: 07/29/14  1:21 PM  Result Value Ref Range   Fecal Occult Bld POSITIVE (A) NEGATIVE  Basic metabolic panel     Status: Abnormal   Collection Time: 07/30/14  5:56 AM  Result Value Ref Range   Sodium 135 135 - 145 mmol/L   Potassium 4.0 3.5 - 5.1 mmol/L   Chloride 99 96 - 112 mmol/L   CO2 24 19 - 32 mmol/L   Glucose, Bld 86 70 - 99 mg/dL   BUN  45 (H) 6 - 23 mg/dL   Creatinine, Ser 7.89 (H) 0.50 - 1.10 mg/dL   Calcium 8.5 8.4 - 10.5 mg/dL   GFR calc non Af Amer 5 (L) >90 mL/min   GFR calc Af Amer 6 (L) >90 mL/min    Comment: (NOTE) The eGFR has been calculated using the CKD EPI equation. This calculation has not been validated in all clinical situations. eGFR's persistently <90 mL/min signify possible Chronic Kidney Disease.    Anion gap 12 5 - 15  CBC     Status: Abnormal   Collection Time: 07/30/14  5:56 AM  Result Value Ref Range   WBC 4.7 4.0 - 10.5 K/uL   RBC 3.46 (L) 3.87 - 5.11 MIL/uL   Hemoglobin 10.3 (L) 12.0 - 15.0 g/dL   HCT 32.1 (L) 36.0 - 46.0 %   MCV 92.8 78.0 - 100.0 fL   MCH 29.8 26.0 - 34.0 pg   MCHC 32.1 30.0 - 36.0 g/dL   RDW 15.1 11.5 - 15.5 %   Platelets 236 150 - 400 K/uL  Renal function panel     Status: Abnormal   Collection Time: 07/30/14  9:00 AM  Result Value Ref Range   Sodium 135 135 - 145 mmol/L   Potassium 4.1 3.5 - 5.1 mmol/L   Chloride 99 96 - 112 mmol/L   CO2 22 19 - 32 mmol/L   Glucose, Bld 90 70 - 99 mg/dL   BUN 46 (H) 6 - 23 mg/dL   Creatinine, Ser 8.13 (H) 0.50 - 1.10 mg/dL   Calcium 8.7 8.4 - 10.5 mg/dL   Phosphorus 5.4 (H) 2.3 - 4.6 mg/dL   Albumin 2.9 (L) 3.5 - 5.2 g/dL   GFR calc non Af Amer 5 (L) >90 mL/min   GFR calc Af Amer 6 (L) >90 mL/min    Comment: (NOTE) The eGFR has been calculated using the CKD EPI equation. This calculation has not been validated in all clinical situations. eGFR's persistently <90 mL/min signify possible Chronic Kidney Disease.    Anion gap 14 5 - 15    No results found.             Blood pressure 160/82, pulse 82, temperature 97.2 F (36.2 C), temperature source Oral, resp. rate 16, height 5' 6"  (1.676 m), weight 61.8 kg (136 lb 3.9 oz), last menstrual period 12/02/2010, SpO2 94 %.  Physical exam:   General--Pleasant African-American female alert and oriented currently on dialysis  Heart-- regular rate and rhythm without  murmurs are gallops Lungs--clear Abdomen-- soft and nontender Psych-- alert and oriented   Assessment: 1. Anemia/heme positive stool/dyspepsia. Probably due to upper G.I. bleed. She does had colonoscopy last year with polyps removed 2. Colon polyps colonoscopy just last year 3. End-stage renal disease on hemodialysis  Plan: 1. Agree with going ahead with protonix. 2.  Due to scheduling issues will get patient scheduled for endoscopy tomorrow morning with Dr. Watt Climes approximately 11:30 AM. Have discussed this with her.   Lonnie Rosado JR,Gurinder Toral L 07/30/2014, 1:08 PM

## 2014-07-30 NOTE — Progress Notes (Signed)
Subjective:  Passing unformed dark stools, no other complaints, breathing well, currently NPO for GI consult.  Objective: Vital signs in last 24 hours: Temp:  [98.3 F (36.8 C)-99 F (37.2 C)] 98.3 F (36.8 C) (03/11 0845) Pulse Rate:  [73-81] 73 (03/11 0845) Resp:  [17-20] 17 (03/11 0845) BP: (167-172)/(82-93) 172/93 mmHg (03/11 0845) SpO2:  [98 %-100 %] 100 % (03/11 0845) Weight:  [62.596 kg (138 lb)] 62.596 kg (138 lb) (03/10 2110) Weight change: -0.204 kg (-7.2 oz)  Intake/Output from previous day: 03/10 0701 - 03/11 0700 In: 1200 [P.O.:1200] Out: 201 [Urine:200; Stool:1] Intake/Output this shift:   Lab Results:  Recent Labs  07/28/14 0800 07/30/14 0556  WBC 4.2 4.7  HGB 9.5* 10.3*  HCT 29.2* 32.1*  PLT 204 236   BMET:  Recent Labs  07/28/14 0800 07/30/14 0556  NA 132* 135  K 3.4* 4.0  CL 97 99  CO2 24 24  GLUCOSE 91 86  BUN 56* 45*  CREATININE 8.45* 7.89*  CALCIUM 8.2* 8.5  ALBUMIN 2.7*  --    No results for input(s): PTH in the last 72 hours. Iron Studies: No results for input(s): IRON, TIBC, TRANSFERRIN, FERRITIN in the last 72 hours.  Studies/Results: No results found.   EXAM: General appearance:  Alert, in no apparent distress Resp:  CTA without rales, rhonchi, or wheezes Cardio:  RRR without murmur or rub GI:  + BS, soft and nontender Extremities:  No edema   Access:  AVF @ RUA with + bruit  HD: [facility]WF [facility] 3.5h 64kg 2/2.0 BAth RUA AVF Heparin 6400 Prof 2 Calcitriol 1 ug / hd, Mircera 75 ug every 2 wk, Venofer 50/wk  Assessment/Plan: 1. Hypoxemia / SOB / pulm edema - resolved. 2. Dark stools - GI to consult. Hb 11.3 3. Diarrhea - chronic per pt, C diff negative. 4. Bacteremia - 1/4 + for GPC, may be contaminant; antibiotics dc'd. 5. H1N1 influenza A - on Tamiflu, much better, afebrile. 6. ESRD - HD on MWF @ [facility], K 4.  HD pending.  7. HTN/Volume - BP 172/93 on Amlodipine 10 mg qd, Lisinopril 40 mg qd, Metoprolol 25 mg bid; below  [name]. 8. Anemia - Hgb 10.3, resume Mircera as outpatient. 9. Sec HPT - Ca 8.5 (9.5 corrected), Calcitriol 1 mcg. 10. Nutrition - Alb 2.7, currently NPO.   LOS: 5 days   LYLES,CHARLES 07/30/2014,8:57 AM  Pt seen, examined and agree w A/P as above.  [name] MD pager [phone]    cell [phone] 07/30/2014, 11:52 AM

## 2014-07-30 NOTE — Progress Notes (Addendum)
[facility] PROGRESS NOTE  [name] [id number] DOB: 20-Dec-1960 DOA: 07/25/2014 PCP: No primary care provider on file.  Assessment/Plan:  Acute Hypoxic resp failure -due to Flash pulmonary edema/influenza A pneumonia -resolved with HD and flu Rx -continue Tamiflu with hemodialysis, stop after todays dose -2-D echo shows EF of 55-60%, moderate mitral stenosis, pulmonary hypertension with a PA pressure of 65 -blood culture 1/2, with coag negative staph, likely contaminant, stopped empiric abx  Hypertensive urgency -resolved  -continue lisinopril, labetalol, Norvasc -Continue PRN hydralazine  Demand ischemia - due to pulm edema/influenza -no wall motion abnormalities on echo -continue ASA, change labetalol to metoprolol -FU with Cards as outpatient  Melena with Chronic diarrhea for 6 months -reports colonoscopy 90months ago per dr.Schooler  -melena appears to be improving, continue PPI -GI to see today and plan for EGD, hb stable -check CBC in am  End-stage renal disease on hemodialysis M/W/F -HD per Renal  Anemia -no need for ESA now, Hb 9.6, resume Mircera on dc  Code Status: full Family Communication: none at bedside Disposition Plan : home when stable   Brief narrative: [name] is a 54 y.o. BF PMHx stroke 2008, HTN, ESRD on HD M/W/F Lupus, DUB, anemia, PUD. Presents with cold symptoms including nonproductive cough that have been ongoing for the last 2 days, acute SOB.[name] Has not missed her HD session. States base weight 64 kg postdialysis. Patient says he had an echocardiogram 6 months ago at [facility]. PCP is a Dr. Sharene Butters at [facility]. States only Lupus symptoms have been her renal failure.  Consultants:  Nephrology  GI  Procedures:  Hemodialysis  Antibiotics: Rocephin azithromycin  HPI/Subjective: 3 episodes of black stools yesterday, none today so far  Objective: Filed Vitals:   07/30/14 1130 07/30/14 1200 07/30/14 1230  07/30/14 1315  BP: 168/94 152/100 160/82 153/97  Pulse: 82 92 82 100  Temp:    98.3 F (36.8 C)  TempSrc:    Oral  Resp:    16  Height:      Weight:    60 kg (132 lb 4.4 oz)  SpO2:    96%    Intake/Output Summary (Last 24 hours) at 07/30/14 1322 Last data filed at 07/30/14 1315  Gross per 24 hour  Intake    720 ml  Output   2101 ml  Net  -1381 ml    Exam: seen on HD AAOx3, no distress Lungs: clear RRR no MRG Abd soft, NTND, no mass or ascites No LE or UE edema RUA AVF    Data Reviewed: Basic Metabolic Panel:  Recent Labs Lab 07/25/14 2140  07/26/14 0841 07/27/14 0635 07/28/14 0800 07/30/14 0556 07/30/14 0900  NA 131*  < > 134* 132* 132* 135 135  K 4.6  < > 3.4* 3.6 3.4* 4.0 4.1  CL 95*  < > 97 93* 97 99 99  CO2 24  --  27 24 24 24 22   GLUCOSE 128*  < > 89 82 91 86 90  BUN 38*  < > 13 38* 56* 45* 46*  CREATININE 8.34*  < > 3.55* 6.26* 8.45* 7.89* 8.13*  CALCIUM 9.2  --  8.5 8.3* 8.2* 8.5 8.7  MG 2.0  --  1.9  --   --   --   --   PHOS 6.3*  --   --   --   --   --  5.4*  < > = values in this interval not displayed.  Liver Function Tests:  Recent Labs Lab 07/25/14 2140 07/26/14 0841 07/27/14 0635 07/28/14 0800 07/30/14 0900  AST 33 34 41* 33  --   ALT 19 20 23 23   --   ALKPHOS 106 91 76 67  --   BILITOT 0.9 0.9 0.7 0.5  --   PROT 8.0 7.5 6.6 6.9  --   ALBUMIN 3.5 3.1* 2.7* 2.7* 2.9*   No results for input(s): LIPASE, AMYLASE in the last 168 hours. No results for input(s): AMMONIA in the last 168 hours.  CBC:  Recent Labs Lab 07/25/14 2140  07/26/14 0047 07/26/14 0841 07/27/14 0635 07/28/14 0800 07/30/14 0556  WBC 12.7*  --  10.0 8.7 5.5 4.2 4.7  NEUTROABS 11.0*  --   --  7.9*  --   --   --   HGB 11.5*  < > 9.6* 9.9* 9.7* 9.5* 10.3*  HCT 36.3  < > 29.6* 30.4* 29.8* 29.2* 32.1*  MCV 95.3  --  93.7 93.5 92.0 92.1 92.8  PLT 201  --  167 192 187 204 236  < > = values in this interval not displayed.  Cardiac Enzymes:  Recent Labs Lab  07/25/14 2140 07/26/14 0841 07/26/14 1138  TROPONINI 0.06* 0.27* 0.31*   BNP (last 3 results)  Recent Labs  07/25/14 2140  BNP 2460.9*    ProBNP (last 3 results) No results for input(s): PROBNP in the last 8760 hours.    CBG: No results for input(s): GLUCAP in the last 168 hours.  Recent Results (from the past 240 hour(s))  Culture, blood (routine x 2)     Status: None   Collection Time: 07/25/14 10:07 PM  Result Value Ref Range Status   Specimen Description BLOOD LEFT FOREARM  Final   Special Requests BOTTLES DRAWN AEROBIC AND ANAEROBIC 10CC  Final   Culture   Final    STAPHYLOCOCCUS SPECIES (COAGULASE NEGATIVE) Note: THE SIGNIFICANCE OF ISOLATING THIS ORGANISM FROM A SINGLE SET OF BLOOD CULTURES WHEN MULTIPLE SETS ARE DRAWN IS UNCERTAIN. PLEASE NOTIFY THE MICROBIOLOGY DEPARTMENT WITHIN ONE WEEK IF SPECIATION AND SENSITIVITIES ARE REQUIRED. Note: Gram Stain Report Called to,Read Back By and Verified With: Margie Ege AT 11:46 P.M. ON 07/26/2014 WARRB Performed at [facility]    Report Status 07/28/2014 FINAL  Final  Culture, blood (routine x 2)     Status: None (Preliminary result)   Collection Time: 07/25/14 10:10 PM  Result Value Ref Range Status   Specimen Description BLOOD LEFT HAND  Final   Special Requests BOTTLES DRAWN AEROBIC ONLY 10CC  Final   Culture   Final           BLOOD CULTURE RECEIVED NO GROWTH TO DATE CULTURE WILL BE HELD FOR 5 DAYS BEFORE ISSUING A FINAL NEGATIVE REPORT Performed at [facility]    Report Status PENDING  Incomplete  Culture, blood (routine x 2) Call MD if unable to obtain prior to antibiotics being given     Status: None (Preliminary result)   Collection Time: 07/26/14 12:37 AM  Result Value Ref Range Status   Specimen Description BLOOD LEFT WRIST  Final   Special Requests BOTTLES DRAWN AEROBIC AND ANAEROBIC 5CC  Final   Culture   Final           BLOOD CULTURE RECEIVED NO GROWTH TO DATE CULTURE WILL BE HELD FOR 5  DAYS BEFORE ISSUING A FINAL NEGATIVE REPORT Performed at [facility]    Report Status PENDING  Incomplete  Culture, blood (routine  x 2) Call MD if unable to obtain prior to antibiotics being given     Status: None (Preliminary result)   Collection Time: 07/26/14 12:37 AM  Result Value Ref Range Status   Specimen Description BLOOD LEFT HAND  Final   Special Requests BOTTLES DRAWN AEROBIC AND ANAEROBIC 5CC  Final   Culture   Final           BLOOD CULTURE RECEIVED NO GROWTH TO DATE CULTURE WILL BE HELD FOR 5 DAYS BEFORE ISSUING A FINAL NEGATIVE REPORT Performed at [facility]    Report Status PENDING  Incomplete  MRSA PCR Screening     Status: None   Collection Time: 07/26/14  6:20 AM  Result Value Ref Range Status   MRSA by PCR NEGATIVE NEGATIVE Final    Comment:        The GeneXpert MRSA Assay (FDA approved for NASAL specimens only), is one component of a comprehensive MRSA colonization surveillance program. It is not intended to diagnose MRSA infection nor to guide or monitor treatment for MRSA infections.   Respiratory virus panel (routine influenza)     Status: Abnormal   Collection Time: 07/26/14 10:47 AM  Result Value Ref Range Status   Source - RVPAN NASS  Corrected   Respiratory Syncytial Virus A Negative Negative Final   Respiratory Syncytial Virus B Negative Negative Final   Influenza A Positive (A) Negative Final   Influenza B Negative Negative Final   Parainfluenza 1 Negative Negative Final   Parainfluenza 2 Negative Negative Final   Parainfluenza 3 Negative Negative Final   Metapneumovirus Negative Negative Final   Rhinovirus Negative Negative Final   Adenovirus Negative Negative Final    Comment: (NOTE) Performed At: [facility] [place], [place] [id number] Lindon Romp MD [id number]   Clostridium Difficile by PCR     Status: None   Collection Time: 07/27/14  7:35 PM  Result Value Ref Range Status   C  difficile by pcr NEGATIVE NEGATIVE Final     Studies: Dg Chest Port 1 View  07/26/2014   CLINICAL DATA:  Pulmonary edema.  EXAM: PORTABLE CHEST - 1 VIEW  COMPARISON:  07/25/2014  FINDINGS: Cardiac enlargement with pulmonary vascular congestion. Bilateral perihilar edema. Small bilateral pleural effusions, greater on the left. No significant change since previous study. No pneumothorax.  IMPRESSION: No active disease.   Electronically Signed   By: Lucienne Capers M.D.   On: 07/26/2014 00:51   Dg Chest Port 1 View  07/25/2014   CLINICAL DATA:  Shortness of breath.  EXAM: PORTABLE CHEST - 1 VIEW  COMPARISON:  Most recent chest radiographs 01/30/2006  FINDINGS: The heart is at the upper limits of normal in size. There is vascular congestion and pulmonary edema. Bilateral patchy lower lobe airspace opacities, left greater than right. Probable left pleural effusion. Heart appears at the upper limits of normal in size. There is no pneumothorax. No acute osseous abnormalities are seen.  IMPRESSION: 1. Vascular congestion and pulmonary edema. Findings suggest fluid overload/CHF. 2. Bibasilar opacities, left greater than right. This may be vascular in etiology versus atelectasis or pneumonia. There is a probable left pleural effusion.   Electronically Signed   By: Jeb Levering M.D.   On: 07/25/2014 21:56    Scheduled Meds: . acetaminophen      . amLODipine  10 mg Oral Daily  . aspirin  81 mg Oral Daily  . calcitRIOL  1 mcg Oral Daily  .  lisinopril  40 mg Oral Daily  . metoprolol tartrate  25 mg Oral BID  . oseltamivir  30 mg Oral Q M,W,F-HD  . pantoprazole (PROTONIX) IV  40 mg Intravenous Q12H  . sodium chloride  3 mL Intravenous Q12H   Continuous Infusions: . sodium chloride 10 mL/hr at 07/26/14 1100    Principal Problem:   Flash pulmonary edema Active Problems:   End-stage renal disease on hemodialysis   Hypertensive urgency   Lupus (systemic lupus erythematosus)   DUB (dysfunctional  uterine bleeding)   Anemia   Peptic ulcer disease   Stroke   Pulmonary edema    Time spent: 30 minutes   [facility] Pager [phone]. If 7PM-7AM, please contact night-coverage at [email or web address], password [facility] 07/30/2014, 1:22 PM  LOS: 5 days

## 2014-07-31 ENCOUNTER — Encounter (HOSPITAL_COMMUNITY): Payer: Self-pay | Admitting: Gastroenterology

## 2014-07-31 ENCOUNTER — Encounter (HOSPITAL_COMMUNITY)
Admission: EM | Disposition: A | Discharge status: Home / Self Care | Payer: Self-pay | Source: Home / Self Care | Attending: Internal Medicine

## 2014-07-31 HISTORY — PX: ESOPHAGOGASTRODUODENOSCOPY: SHX5428

## 2014-07-31 LAB — CBC
HCT: 34.5 % — ABNORMAL LOW (ref 36.0–46.0)
Hemoglobin: 11.1 g/dL — ABNORMAL LOW (ref 12.0–15.0)
MCH: 29.8 pg (ref 26.0–34.0)
MCHC: 32.2 g/dL (ref 30.0–36.0)
MCV: 92.5 fL (ref 78.0–100.0)
PLATELETS: 248 10*3/uL (ref 150–400)
RBC: 3.73 MIL/uL — ABNORMAL LOW (ref 3.87–5.11)
RDW: 14.9 % (ref 11.5–15.5)
WBC: 5.3 10*3/uL (ref 4.0–10.5)

## 2014-07-31 SURGERY — EGD (ESOPHAGOGASTRODUODENOSCOPY)
Anesthesia: Moderate Sedation

## 2014-07-31 MED ORDER — MIDAZOLAM HCL 5 MG/ML IJ SOLN
INTRAMUSCULAR | Status: AC
  Filled 2014-07-31: qty 2

## 2014-07-31 MED ORDER — BUTAMBEN-TETRACAINE-BENZOCAINE 2-2-14 % EX AERO
INHALATION_SPRAY | CUTANEOUS | Status: DC | PRN
  Administered 2014-07-31: 1 via TOPICAL

## 2014-07-31 MED ORDER — SODIUM CHLORIDE 0.9 % IV SOLN: INTRAVENOUS | Status: DC

## 2014-07-31 MED ORDER — FENTANYL CITRATE 0.05 MG/ML IJ SOLN
INTRAMUSCULAR | Status: AC
  Filled 2014-07-31: qty 2

## 2014-07-31 MED ORDER — MIDAZOLAM HCL 10 MG/2ML IJ SOLN
INTRAMUSCULAR | Status: DC | PRN
  Administered 2014-07-31: 1 mg via INTRAVENOUS
  Administered 2014-07-31 (×2): 2 mg via INTRAVENOUS

## 2014-07-31 MED ORDER — FENTANYL CITRATE 0.05 MG/ML IJ SOLN
INTRAMUSCULAR | Status: DC | PRN
  Administered 2014-07-31 (×2): 25 ug via INTRAVENOUS

## 2014-07-31 NOTE — Progress Notes (Deleted)
.  cel

## 2014-07-31 NOTE — Op Note (Signed)
[facility] [place], [id number]   ENDOSCOPY PROCEDURE REPORT  PATIENT: [name], [name]  MR#: [id number] BIRTHDATE: [birth year] , 21  yrs. old GENDER: female ENDOSCOPIST: Clarene Essex, MD REFERRED BY: PROCEDURE DATE:  Aug 09, 2014 PROCEDURE:  EGD, diagnostic ASA CLASS:     Class III INDICATIONS:  acute post hemorrhagic anemia. MEDICATIONS: Fentanyl 50 mcg IV and Versed 5 mg IV TOPICAL ANESTHETIC: Cetacaine Spray  DESCRIPTION OF PROCEDURE: After the risks benefits and alternatives of the procedure were thoroughly explained, informed consent was obtained.  The endoscope [id number] endoscope was introduced through the mouth and advanced to the second portion of the duodenum , Without limitations.  The instrument was slowly withdrawn as the mucosa was fully examined.    findings are recorded below       Retroflexed views revealed a hiatal hernia.     The scope was then withdrawn from the patient and the procedure completed.  COMPLICATIONS: There were no immediate complications.  ENDOSCOPIC IMPRESSION: 1. Small hiatal hernia 2. Mild antritis and bulbitis 3. Otherwise within normal limits EGD without any signs of active bleeding   RECOMMENDATIONS: okay to advance diet and go home per hospital team and my partner Dr. Michail Sermon is happy to see back when necessary particularly if further GI workup and plans are needed like possibly a capsule endoscopy if guaiac positivity continues otherwise care with aspirin nonsteroidals and blood thinners and continue once a day pump inhibitors for now  REPEAT EXAM: as needed  eSigned:  Clarene Essex, MD 08-09-2014 11:34 AM    CC:  CPT CODES: ICD CODES:  The ICD and CPT codes recommended by this software are interpretations from the data that the clinical staff has captured with the software.  The verification of the translation of this report to the ICD and CPT codes and modifiers is the  sole responsibility of the health care institution and practicing physician where this report was generated.  [employer] will not be held responsible for the validity of the ICD and CPT codes included on this report.  AMA assumes no liability for data contained or not contained herein. CPT is a Designer, television/film set of the [employer].  PATIENT NAME:  [name], [name] MR#: [id number]

## 2014-07-31 NOTE — Progress Notes (Signed)
Pt reports that EGD has not been fully explained to her. Educated patient that the procedure will be explained in full by the physician and that she may sign consent at that time. Consent is written, prepared for signature and placed in the front of patient chart to be signed once patient has been fully educated. Dorthey Sawyer, RN

## 2014-07-31 NOTE — Progress Notes (Signed)
Subjective:  NPO for EGD this morning, no recent BM, no dyspnea, feeling well  Objective: Vital signs in last 24 hours: Temp:  [97.2 F (36.2 C)-98.9 F (37.2 C)] 97.9 F (36.6 C) (03/12 0511) Pulse Rate:  [73-100] 82 (03/12 0511) Resp:  [16-18] 16 (03/12 0511) BP: (144-181)/(82-112) 170/95 mmHg (03/12 0511) SpO2:  [94 %-100 %] 100 % (03/12 0511) Weight:  [60 kg (132 lb 4.4 oz)-63.549 kg (140 lb 1.6 oz)] 63.549 kg (140 lb 1.6 oz) (03/11 2015) Weight change: -0.796 kg (-1 lb 12.1 oz)  Intake/Output from previous day: 03/11 0701 - 03/12 0700 In: 240 [P.O.:240] Out: 2000  Intake/Output this shift:   Lab Results:  Recent Labs  07/28/14 0800 07/30/14 0556  WBC 4.2 4.7  HGB 9.5* 10.3*  HCT 29.2* 32.1*  PLT 204 236   BMET:  Recent Labs  07/28/14 0800 07/30/14 0556 07/30/14 0900  NA 132* 135 135  K 3.4* 4.0 4.1  CL 97 99 99  CO2 24 24 22   GLUCOSE 91 86 90  BUN 56* 45* 46*  CREATININE 8.45* 7.89* 8.13*  CALCIUM 8.2* 8.5 8.7  ALBUMIN 2.7*  --  2.9*   No results for input(s): PTH in the last 72 hours. Iron Studies: No results for input(s): IRON, TIBC, TRANSFERRIN, FERRITIN in the last 72 hours.  Studies/Results: No results found.  EXAM: General appearance: Alert, in no apparent distress Resp: CTA without rales, rhonchi, or wheezes Cardio: RRR without murmur or rub GI: + BS, soft and nontender Extremities: No edema  Access: AVF @ RUA with + bruit  HD: [facility] 3.5h 64kg 2/2.0 BAth RUA AVF Heparin 6400 Prof 2 Calcitriol 1 ug / hd, Mircera 75 ug every 2 wk, Venofer 50/wk  Assessment/Plan: 1. Hypoxemia / SOB / pulm edema - resolved. 2. Dark stools - suspect upper GI bleed, endoscopy pending today. Hb 10.3 3. Diarrhea - chronic per pt, C diff negative. 4. Bacteremia - 1/4 + for GPC, may be contaminant; antibiotics dc'd. 5. H1N1 influenza A - on Tamiflu, much better, afebrile. 6. ESRD - HD on MWF @ [place], K 4.1. Next PD on 3/14.   7. HTN/Volume - BP 170/95 on Amlodipine 10 mg qd, Lisinopril 40 mg qd, Metoprolol 25 mg bid; below [name]. 8. Anemia - Hgb 10.3, resume Mircera as outpatient. 9. 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 10. Nutrition - Alb 2.9, currently NPO.    LOS: 6 days   LYLES,CHARLES 07/31/2014,7:32 AM  Pt seen, examined and agree w A/P as above.  [name] MD pager [phone]    cell [phone] 07/31/2014, 12:15 PM   Pt seen, examined and agree w A/P as above.  [name] MD pager [phone]    cell [phone] 07/31/2014, 12:14 PM

## 2014-07-31 NOTE — Progress Notes (Signed)
[facility] PROGRESS NOTE  [name] [id number] DOB: 1961-02-10 DOA: 07/25/2014 PCP: No primary care provider on file.  Assessment/Plan:  Acute Hypoxic resp failure -due to Flash pulmonary edema/influenza A pneumonia -resolved with HD and flu Rx -completed Tamiflu course with HD -2-D echo shows EF of 55-60%, moderate mitral stenosis, pulmonary hypertension with a PA pressure of 65 -blood culture 1/2, with coag negative staph, likely contaminant, stopped empiric abx  Hypertensive urgency -resolved  -continue lisinopril, labetalol, Norvasc -Continue PRN hydralazine  Demand ischemia - due to pulm edema/influenza -no wall motion abnormalities on echo -continue ASA, change labetalol to metoprolol -FU with Cards as outpatient  Melena with Chronic diarrhea for 6 months -reports colonoscopy 84months ago per dr.Schooler  -melena appears to be improving, continue PPI -EGD 3/12 with Small hiatal hernia, Mild antritis and bulbitis otherwise normal -check CBC in am  End-stage renal disease on hemodialysis M/W/F -HD per Renal  Anemia -of chronic disease,  And Gi blood loss -stable   Code Status: full Family Communication: none at bedside Disposition Plan : home tomorrow if no further melena   Brief narrative: [name] is a 54 y.o. BF PMHx stroke 2008, HTN, ESRD on HD M/W/F Lupus, DUB, anemia, PUD. Presents with cold symptoms including nonproductive cough that have been ongoing for the last 2 days, acute SOB.[name] Has not missed her HD session. States base weight 64 kg postdialysis. Patient says he had an echocardiogram 6 months ago at [facility]. PCP is a Dr. Sharene Butters at [facility]. States only Lupus symptoms have been her renal failure.  Consultants:  Nephrology  GI  Procedures:  Hemodialysis  Antibiotics: Rocephin azithromycin  HPI/Subjective: Bm this am with dark stool  Objective: Filed Vitals:   07/31/14 1130 07/31/14 1132 07/31/14 1135 07/31/14  1141  BP: 184/96 188/97 201/88 183/95  Pulse: 86 80 79   Temp:  98 F (36.7 C)    TempSrc:  Oral    Resp: 17 18 15    Height:      Weight:      SpO2: 99% 99% 100%     Intake/Output Summary (Last 24 hours) at 07/31/14 1426 Last data filed at 07/31/14 1022  Gross per 24 hour  Intake    240 ml  Output      0 ml  Net    240 ml    Exam: seen on HD AAOx3, no distress Lungs: clear RRR no MRG Abd soft, NTND, no mass or ascites No LE or UE edema RUA AVF    Data Reviewed: Basic Metabolic Panel:  Recent Labs Lab 07/25/14 2140  07/26/14 0841 07/27/14 0635 07/28/14 0800 07/30/14 0556 07/30/14 0900  NA 131*  < > 134* 132* 132* 135 135  K 4.6  < > 3.4* 3.6 3.4* 4.0 4.1  CL 95*  < > 97 93* 97 99 99  CO2 24  --  27 24 24 24 22   GLUCOSE 128*  < > 89 82 91 86 90  BUN 38*  < > 13 38* 56* 45* 46*  CREATININE 8.34*  < > 3.55* 6.26* 8.45* 7.89* 8.13*  CALCIUM 9.2  --  8.5 8.3* 8.2* 8.5 8.7  MG 2.0  --  1.9  --   --   --   --   PHOS 6.3*  --   --   --   --   --  5.4*  < > = values in this interval not displayed.  Liver Function Tests:  Recent Labs Lab  07/25/14 2140 07/26/14 0841 07/27/14 0635 07/28/14 0800 07/30/14 0900  AST 33 34 41* 33  --   ALT 19 20 23 23   --   ALKPHOS 106 91 76 67  --   BILITOT 0.9 0.9 0.7 0.5  --   PROT 8.0 7.5 6.6 6.9  --   ALBUMIN 3.5 3.1* 2.7* 2.7* 2.9*   No results for input(s): LIPASE, AMYLASE in the last 168 hours. No results for input(s): AMMONIA in the last 168 hours.  CBC:  Recent Labs Lab 07/25/14 2140  07/26/14 0047 07/26/14 0841 07/27/14 0635 07/28/14 0800 07/30/14 0556  WBC 12.7*  --  10.0 8.7 5.5 4.2 4.7  NEUTROABS 11.0*  --   --  7.9*  --   --   --   HGB 11.5*  < > 9.6* 9.9* 9.7* 9.5* 10.3*  HCT 36.3  < > 29.6* 30.4* 29.8* 29.2* 32.1*  MCV 95.3  --  93.7 93.5 92.0 92.1 92.8  PLT 201  --  167 192 187 204 236  < > = values in this interval not displayed.  Cardiac Enzymes:  Recent Labs Lab 07/25/14 2140  07/26/14 0841 07/26/14 1138  TROPONINI 0.06* 0.27* 0.31*   BNP (last 3 results)  Recent Labs  07/25/14 2140  BNP 2460.9*    ProBNP (last 3 results) No results for input(s): PROBNP in the last 8760 hours.    CBG: No results for input(s): GLUCAP in the last 168 hours.  Recent Results (from the past 240 hour(s))  Culture, blood (routine x 2)     Status: None   Collection Time: 07/25/14 10:07 PM  Result Value Ref Range Status   Specimen Description BLOOD LEFT FOREARM  Final   Special Requests BOTTLES DRAWN AEROBIC AND ANAEROBIC 10CC  Final   Culture   Final    STAPHYLOCOCCUS SPECIES (COAGULASE NEGATIVE) Note: THE SIGNIFICANCE OF ISOLATING THIS ORGANISM FROM A SINGLE SET OF BLOOD CULTURES WHEN MULTIPLE SETS ARE DRAWN IS UNCERTAIN. PLEASE NOTIFY THE MICROBIOLOGY DEPARTMENT WITHIN ONE WEEK IF SPECIATION AND SENSITIVITIES ARE REQUIRED. Note: Gram Stain Report Called to,Read Back By and Verified With: Margie Ege AT 11:46 P.M. ON 07/26/2014 WARRB Performed at [facility]    Report Status 07/28/2014 FINAL  Final  Culture, blood (routine x 2)     Status: None (Preliminary result)   Collection Time: 07/25/14 10:10 PM  Result Value Ref Range Status   Specimen Description BLOOD LEFT HAND  Final   Special Requests BOTTLES DRAWN AEROBIC ONLY 10CC  Final   Culture   Final           BLOOD CULTURE RECEIVED NO GROWTH TO DATE CULTURE WILL BE HELD FOR 5 DAYS BEFORE ISSUING A FINAL NEGATIVE REPORT Performed at [facility]    Report Status PENDING  Incomplete  Culture, blood (routine x 2) Call MD if unable to obtain prior to antibiotics being given     Status: None (Preliminary result)   Collection Time: 07/26/14 12:37 AM  Result Value Ref Range Status   Specimen Description BLOOD LEFT WRIST  Final   Special Requests BOTTLES DRAWN AEROBIC AND ANAEROBIC 5CC  Final   Culture   Final           BLOOD CULTURE RECEIVED NO GROWTH TO DATE CULTURE WILL BE HELD FOR 5 DAYS BEFORE  ISSUING A FINAL NEGATIVE REPORT Performed at [facility]    Report Status PENDING  Incomplete  Culture, blood (routine x 2) Call  MD if unable to obtain prior to antibiotics being given     Status: None (Preliminary result)   Collection Time: 07/26/14 12:37 AM  Result Value Ref Range Status   Specimen Description BLOOD LEFT HAND  Final   Special Requests BOTTLES DRAWN AEROBIC AND ANAEROBIC 5CC  Final   Culture   Final           BLOOD CULTURE RECEIVED NO GROWTH TO DATE CULTURE WILL BE HELD FOR 5 DAYS BEFORE ISSUING A FINAL NEGATIVE REPORT Performed at [facility]    Report Status PENDING  Incomplete  MRSA PCR Screening     Status: None   Collection Time: 07/26/14  6:20 AM  Result Value Ref Range Status   MRSA by PCR NEGATIVE NEGATIVE Final    Comment:        The GeneXpert MRSA Assay (FDA approved for NASAL specimens only), is one component of a comprehensive MRSA colonization surveillance program. It is not intended to diagnose MRSA infection nor to guide or monitor treatment for MRSA infections.   Respiratory virus panel (routine influenza)     Status: Abnormal   Collection Time: 07/26/14 10:47 AM  Result Value Ref Range Status   Source - RVPAN NASS  Corrected   Respiratory Syncytial Virus A Negative Negative Final   Respiratory Syncytial Virus B Negative Negative Final   Influenza A Positive (A) Negative Final   Influenza B Negative Negative Final   Parainfluenza 1 Negative Negative Final   Parainfluenza 2 Negative Negative Final   Parainfluenza 3 Negative Negative Final   Metapneumovirus Negative Negative Final   Rhinovirus Negative Negative Final   Adenovirus Negative Negative Final    Comment: (NOTE) Performed At: [facility] [place], [place] [id number] Lindon Romp MD [id number]   Clostridium Difficile by PCR     Status: None   Collection Time: 07/27/14  7:35 PM  Result Value Ref Range Status   C difficile by pcr  NEGATIVE NEGATIVE Final     Studies: Dg Chest Port 1 View  07/26/2014   CLINICAL DATA:  Pulmonary edema.  EXAM: PORTABLE CHEST - 1 VIEW  COMPARISON:  07/25/2014  FINDINGS: Cardiac enlargement with pulmonary vascular congestion. Bilateral perihilar edema. Small bilateral pleural effusions, greater on the left. No significant change since previous study. No pneumothorax.  IMPRESSION: No active disease.   Electronically Signed   By: Lucienne Capers M.D.   On: 07/26/2014 00:51   Dg Chest Port 1 View  07/25/2014   CLINICAL DATA:  Shortness of breath.  EXAM: PORTABLE CHEST - 1 VIEW  COMPARISON:  Most recent chest radiographs 01/30/2006  FINDINGS: The heart is at the upper limits of normal in size. There is vascular congestion and pulmonary edema. Bilateral patchy lower lobe airspace opacities, left greater than right. Probable left pleural effusion. Heart appears at the upper limits of normal in size. There is no pneumothorax. No acute osseous abnormalities are seen.  IMPRESSION: 1. Vascular congestion and pulmonary edema. Findings suggest fluid overload/CHF. 2. Bibasilar opacities, left greater than right. This may be vascular in etiology versus atelectasis or pneumonia. There is a probable left pleural effusion.   Electronically Signed   By: Jeb Levering M.D.   On: 07/25/2014 21:56    Scheduled Meds: . amLODipine  10 mg Oral Daily  . aspirin  81 mg Oral Daily  . calcitRIOL  1 mcg Oral Daily  . lisinopril  40 mg Oral Daily  . metoprolol tartrate  25 mg Oral BID  . pantoprazole (PROTONIX) IV  40 mg Intravenous Q12H  . sodium chloride  3 mL Intravenous Q12H   Continuous Infusions:    Principal Problem:   Flash pulmonary edema Active Problems:   End-stage renal disease on hemodialysis   Hypertensive urgency   Lupus (systemic lupus erythematosus)   DUB (dysfunctional uterine bleeding)   Anemia   Peptic ulcer disease   Stroke   Pulmonary edema    Time spent: 30  minutes   [facility] Pager [phone]. If 7PM-7AM, please contact night-coverage at [email or web address], password [facility] 07/31/2014, 2:26 PM  LOS: 6 days

## 2014-07-31 NOTE — Progress Notes (Signed)
[name] 11:14 AM  Subjective: Patient seen and examined and discussed with my partner Dr. Oletta Lamas and her hospital computer chart was reviewed and she currently has no GI symptoms and we rediscussed endoscopy with her  Objective: Vital signs stable afebrile no acute distress no labs this morning exam please see preassessment evaluation  Assessment: Guaiac positivity and anemia in a patient with multiple medical problems  Plan: Will proceed with endoscopy this morning with further workup and plans pending those findings  [facility]

## 2014-08-01 LAB — CULTURE, BLOOD (ROUTINE X 2)
CULTURE: NO GROWTH
Culture: NO GROWTH
Culture: NO GROWTH

## 2014-08-01 MED ORDER — SODIUM CHLORIDE 0.9 % IV SOLN: 62.5000 mg | INTRAVENOUS | Status: DC

## 2014-08-01 MED ORDER — PANTOPRAZOLE SODIUM 40 MG PO TBEC: 40.0000 mg | DELAYED_RELEASE_TABLET | Freq: Two times a day (BID) | ORAL | Status: DC

## 2014-08-01 MED ORDER — METOPROLOL TARTRATE 25 MG PO TABS: 25.0000 mg | ORAL_TABLET | Freq: Two times a day (BID) | ORAL | Status: DC

## 2014-08-01 MED ORDER — ASPIRIN 81 MG PO CHEW: 81.0000 mg | CHEWABLE_TABLET | Freq: Every day | ORAL | Status: DC

## 2014-08-01 NOTE — Progress Notes (Signed)
Pt has been discharge by MD. IV has been taken out and pt has no complaints of pain. Pt is stable at this time and is finishing lunch. She states she would like to wash up before leaving the room. Will continue monitor until pt leaves the floor.

## 2014-08-01 NOTE — Progress Notes (Signed)
Pt left floor with understanding of discharge instructions with no complaints of pain and no signs of distress.

## 2014-08-01 NOTE — Progress Notes (Signed)
Subjective:  No complaints, regular BM yesterday, no dyspnea  Objective: Vital signs in last 24 hours: Temp:  [97.3 F (36.3 C)-98.4 F (36.9 C)] 98 F (36.7 C) (03/13 0534) Pulse Rate:  [79-106] 79 (03/12 2050) Resp:  [12-23] 18 (03/13 0534) BP: (141-243)/(81-150) 166/86 mmHg (03/13 0534) SpO2:  [97 %-100 %] 99 % (03/13 0534) Weight:  [61.236 kg (135 lb)] 61.236 kg (135 lb) (03/12 2050) Weight change: -0.564 kg (-1 lb 3.9 oz)  Intake/Output from previous day: 03/12 0701 - 03/13 0700 In: 360 [P.O.:360] Out: -  Intake/Output this shift:   Lab Results:  Recent Labs  07/30/14 0556 07/31/14 1625  WBC 4.7 5.3  HGB 10.3* 11.1*  HCT 32.1* 34.5*  PLT 236 248   BMET:  Recent Labs  07/30/14 0556 07/30/14 0900  NA 135 135  K 4.0 4.1  CL 99 99  CO2 24 22  GLUCOSE 86 90  BUN 45* 46*  CREATININE 7.89* 8.13*  CALCIUM 8.5 8.7  ALBUMIN  --  2.9*   No results for input(s): PTH in the last 72 hours. Iron Studies: No results for input(s): IRON, TIBC, TRANSFERRIN, FERRITIN in the last 72 hours.  Studies/Results: No results found.   EXAM: General appearance: Alert, in no apparent distress Resp: CTA without rales, rhonchi, or wheezes Cardio: RRR without murmur or rub GI: + BS, soft and nontender Extremities: No edema  Access: AVF @ RUA with + bruit  HD: [facility] 3.5h 64kg 2/2.0 BAth RUA AVF Heparin 6400 Prof 2 Calcitriol 1 ug / hd, Mircera 75 ug every 2 wk, Venofer 50/wk  Assessment/Plan: 1. Hypoxemia / SOB / pulm edema - resolved. 2. Dark stools - small hiatal hernia, mild antritis & bulbitis per endoscopy yesterday, continue PPI per GI. 3. Diarrhea - chronic per pt, C diff negative. 4. Bacteremia - 1/4 + for GPC, may be contaminant; antibiotics dc'd. 5. H1N1 influenza A - on Tamiflu, much better, afebrile. 6. ESRD - HD on MWF @ [place], K 4.1. Next HD tomorrow.  7. HTN/Volume - BP 166/86 on Amlodipine 10 mg qd, Lisinopril 40 mg qd, Metoprolol 25 mg  bid; below [name]. 8. Anemia - Hgb 11.1, resume Mircera as outpatient. 9. 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 10. Nutrition - Alb 2.9, currently NPO.    LOS: 7 days   [name],[name] 08/01/2014,8:14 AM  Pt seen, examined and agree w A/P as above.  [name] MD pager [phone]    cell [phone] 08/01/2014, 2:43 PM

## 2014-08-01 NOTE — Discharge Summary (Signed)
Physician Discharge Summary  [name] [id number] DOB: [birth year] DOA: 07/25/2014  PCP: No primary care provider on file.  Admit date: 07/25/2014 Discharge date: 08/01/2014  Time spent: 45 minutes  Recommendations for Outpatient Follow-up:  1. PCP in 1 week 2. Outpatient Cardiology FU-[facility], call on Monday for FU  Discharge Diagnoses:  Principal Problem:   Flash pulmonary edema   Influenza A   Melena    End-stage renal disease on hemodialysis   Hypertensive urgency   Lupus (systemic lupus erythematosus)   DUB (dysfunctional uterine bleeding)   Anemia   Peptic ulcer disease   Stroke   Pulmonary edema   Discharge Condition: stable  Diet recommendation: Renal  Filed Weights   07/30/14 1315 07/30/14 2015 07/31/14 2050  Weight: 60 kg (132 lb 4.4 oz) 63.549 kg (140 lb 1.6 oz) 61.236 kg (135 lb)    History of present illness:  [name] is a 53/F with PMH of CVA, HTN, ESRD on HD M/W/F Lupus, DUB, anemia, PUD. Presented to the ER with dyspnea  Hospital Course:  Acute Hypoxic resp failure -due to Flash pulmonary edema/influenza A pneumonia -resolved with HD and flu Rx -completed Tamiflu course with HD -2-D echo shows EF of 55-60%, moderate mitral stenosis, pulmonary hypertension with a PA pressure of 65 -blood culture 1/2, with coag negative staph, likely contaminant, stopped empiric abx  Hypertensive urgency -resolved  -continue lisinopril, labetalol, Norvasc  Demand ischemia - minimally elevated troponin on admission due to pulm edema/influenza, ESRD -no wall motion abnormalities on echo -continue ASA, change labetalol to metoprolol -FU with Cards as outpatient  Melena with Chronic diarrhea for 6 months -reported colonoscopy 61months ago per dr.Schooler which was normal -reported melena this admission, now improved -seen by [facility] in consultation -EGD 3/12 with Small hiatal hernia, Mild antritis and bulbitis otherwise normal -Hb stable, continue  PPI BID and FU with Dr.SChooler as needed  End-stage renal disease on hemodialysis M/W/F -HD per Renal  Anemia -of chronic disease, And Gi blood loss -Hb stable   Consultations:  Renal  ID  Discharge Exam: Filed Vitals:   08/01/14 0534  BP: 166/86  Pulse:   Temp: 98 F (36.7 C)  Resp: 18    General: AAOx3 Cardiovascular: S1S2/RRR Respiratory: CTAB  Discharge Instructions   Discharge Instructions    Discharge instructions    Complete by:  As directed   Renal Diet     Increase activity slowly    Complete by:  As directed           Discharge Medication List as of 08/01/2014 12:35 PM    START taking these medications   Details  aspirin 81 MG chewable tablet Chew 1 tablet (81 mg total) by mouth daily., Starting 08/01/2014, Until Discontinued, OTC    metoprolol tartrate (LOPRESSOR) 25 MG tablet Take 1 tablet (25 mg total) by mouth 2 (two) times daily., Starting 08/01/2014, Until Discontinued, Normal    pantoprazole (PROTONIX) 40 MG tablet Take 1 tablet (40 mg total) by mouth 2 (two) times daily., Starting 08/01/2014, Until Discontinued, Normal      CONTINUE these medications which have NOT CHANGED   Details  amLODipine (NORVASC) 10 MG tablet Take 10 mg by mouth daily.  , Until Discontinued, Historical Med    calcium acetate (PHOSLO) 667 MG capsule Take 667 mg by mouth 2 (two) times daily.  , Until Discontinued, Historical Med    lisinopril (PRINIVIL,ZESTRIL) 20 MG tablet Take 40 mg by mouth daily. , Until  Discontinued, Historical Med    promethazine (PHENERGAN) 25 MG tablet Take 25 mg by mouth every 6 (six) hours as needed for nausea., Until Discontinued, Historical Med    b complex-vitamin c-folic acid (NEPHRO-VITE) 0.8 MG TABS tablet Take 0.8 mg by mouth at bedtime., Until Discontinued, Historical Med    simvastatin (ZOCOR) 40 MG tablet Take 40 mg by mouth every evening., Until Discontinued, Historical Med      STOP taking these medications     labetalol  (NORMODYNE) 300 MG tablet      ranitidine (ZANTAC) 150 MG tablet        No Known Allergies Follow-up Information    Follow up with Richardson Dopp, PA-C. Schedule an appointment as soon as possible for a visit in 2 weeks.   Specialty:  Physician Assistant   Contact information:   [id number] [address] [place] [address] [phone]        The results of significant diagnostics from this hospitalization (including imaging, microbiology, ancillary and laboratory) are listed below for reference.    Significant Diagnostic Studies: Dg Chest Port 1 View  07/26/2014   CLINICAL DATA:  Pulmonary edema.  EXAM: PORTABLE CHEST - 1 VIEW  COMPARISON:  07/25/2014  FINDINGS: Cardiac enlargement with pulmonary vascular congestion. Bilateral perihilar edema. Small bilateral pleural effusions, greater on the left. No significant change since previous study. No pneumothorax.  IMPRESSION: No active disease.   Electronically Signed   By: Lucienne Capers M.D.   On: 07/26/2014 00:51   Dg Chest Port 1 View  07/25/2014   CLINICAL DATA:  Shortness of breath.  EXAM: PORTABLE CHEST - 1 VIEW  COMPARISON:  Most recent chest radiographs 01/30/2006  FINDINGS: The heart is at the upper limits of normal in size. There is vascular congestion and pulmonary edema. Bilateral patchy lower lobe airspace opacities, left greater than right. Probable left pleural effusion. Heart appears at the upper limits of normal in size. There is no pneumothorax. No acute osseous abnormalities are seen.  IMPRESSION: 1. Vascular congestion and pulmonary edema. Findings suggest fluid overload/CHF. 2. Bibasilar opacities, left greater than right. This may be vascular in etiology versus atelectasis or pneumonia. There is a probable left pleural effusion.   Electronically Signed   By: Jeb Levering M.D.   On: 07/25/2014 21:56    Microbiology: Recent Results (from the past 240 hour(s))  Culture, blood (routine x 2)     Status: None    Collection Time: 07/25/14 10:07 PM  Result Value Ref Range Status   Specimen Description BLOOD LEFT FOREARM  Final   Special Requests BOTTLES DRAWN AEROBIC AND ANAEROBIC 10CC  Final   Culture   Final    STAPHYLOCOCCUS SPECIES (COAGULASE NEGATIVE) Note: THE SIGNIFICANCE OF ISOLATING THIS ORGANISM FROM A SINGLE SET OF BLOOD CULTURES WHEN MULTIPLE SETS ARE DRAWN IS UNCERTAIN. PLEASE NOTIFY THE MICROBIOLOGY DEPARTMENT WITHIN ONE WEEK IF SPECIATION AND SENSITIVITIES ARE REQUIRED. Note: Gram Stain Report Called to,Read Back By and Verified With: Margie Ege AT 11:46 P.M. ON 07/26/2014 WARRB Performed at [facility]    Report Status 07/28/2014 FINAL  Final  Culture, blood (routine x 2)     Status: None   Collection Time: 07/25/14 10:10 PM  Result Value Ref Range Status   Specimen Description BLOOD LEFT HAND  Final   Special Requests BOTTLES DRAWN AEROBIC ONLY 10CC  Final   Culture   Final    NO GROWTH 5 DAYS Performed at [facility]  Report Status 08/01/2014 FINAL  Final  Culture, blood (routine x 2) Call MD if unable to obtain prior to antibiotics being given     Status: None   Collection Time: 07/26/14 12:37 AM  Result Value Ref Range Status   Specimen Description BLOOD LEFT WRIST  Final   Special Requests BOTTLES DRAWN AEROBIC AND ANAEROBIC 5CC  Final   Culture   Final    NO GROWTH 5 DAYS Performed at [facility]    Report Status 08/01/2014 FINAL  Final  Culture, blood (routine x 2) Call MD if unable to obtain prior to antibiotics being given     Status: None   Collection Time: 07/26/14 12:37 AM  Result Value Ref Range Status   Specimen Description BLOOD LEFT HAND  Final   Special Requests BOTTLES DRAWN AEROBIC AND ANAEROBIC 5CC  Final   Culture   Final    NO GROWTH 5 DAYS Performed at [facility]    Report Status 08/01/2014 FINAL  Final  MRSA PCR Screening     Status: None   Collection Time: 07/26/14  6:20 AM  Result Value Ref Range Status    MRSA by PCR NEGATIVE NEGATIVE Final    Comment:        The GeneXpert MRSA Assay (FDA approved for NASAL specimens only), is one component of a comprehensive MRSA colonization surveillance program. It is not intended to diagnose MRSA infection nor to guide or monitor treatment for MRSA infections.   Respiratory virus panel (routine influenza)     Status: Abnormal   Collection Time: 07/26/14 10:47 AM  Result Value Ref Range Status   Source - RVPAN NASS  Corrected   Respiratory Syncytial Virus A Negative Negative Final   Respiratory Syncytial Virus B Negative Negative Final   Influenza A Positive (A) Negative Final   Influenza B Negative Negative Final   Parainfluenza 1 Negative Negative Final   Parainfluenza 2 Negative Negative Final   Parainfluenza 3 Negative Negative Final   Metapneumovirus Negative Negative Final   Rhinovirus Negative Negative Final   Adenovirus Negative Negative Final    Comment: (NOTE) Performed At: [facility] [place], [place] [id number] Lindon Romp MD [id number]   Clostridium Difficile by PCR     Status: None   Collection Time: 07/27/14  7:35 PM  Result Value Ref Range Status   C difficile by pcr NEGATIVE NEGATIVE Final     Labs: Basic Metabolic Panel:  Recent Labs Lab 07/25/14 2140  07/26/14 0841 07/27/14 0635 07/28/14 0800 07/30/14 0556 07/30/14 0900  NA 131*  < > 134* 132* 132* 135 135  K 4.6  < > 3.4* 3.6 3.4* 4.0 4.1  CL 95*  < > 97 93* 97 99 99  CO2 24  --  27 24 24 24 22   GLUCOSE 128*  < > 89 82 91 86 90  BUN 38*  < > 13 38* 56* 45* 46*  CREATININE 8.34*  < > 3.55* 6.26* 8.45* 7.89* 8.13*  CALCIUM 9.2  --  8.5 8.3* 8.2* 8.5 8.7  MG 2.0  --  1.9  --   --   --   --   PHOS 6.3*  --   --   --   --   --  5.4*  < > = values in this interval not displayed. Liver Function Tests:  Recent Labs Lab 07/25/14 2140 07/26/14 0841 07/27/14 0635 07/28/14 0800 07/30/14 0900  AST 33 34 41*  33  --   ALT  19 20 23 23   --   ALKPHOS 106 91 76 67  --   BILITOT 0.9 0.9 0.7 0.5  --   PROT 8.0 7.5 6.6 6.9  --   ALBUMIN 3.5 3.1* 2.7* 2.7* 2.9*   No results for input(s): LIPASE, AMYLASE in the last 168 hours. No results for input(s): AMMONIA in the last 168 hours. CBC:  Recent Labs Lab 07/25/14 2140  07/26/14 0841 07/27/14 0635 07/28/14 0800 07/30/14 0556 07/31/14 1625  WBC 12.7*  < > 8.7 5.5 4.2 4.7 5.3  NEUTROABS 11.0*  --  7.9*  --   --   --   --   HGB 11.5*  < > 9.9* 9.7* 9.5* 10.3* 11.1*  HCT 36.3  < > 30.4* 29.8* 29.2* 32.1* 34.5*  MCV 95.3  < > 93.5 92.0 92.1 92.8 92.5  PLT 201  < > 192 187 204 236 248  < > = values in this interval not displayed. Cardiac Enzymes:  Recent Labs Lab 07/25/14 2140 07/26/14 0841 07/26/14 1138  TROPONINI 0.06* 0.27* 0.31*   BNP: BNP (last 3 results)  Recent Labs  07/25/14 2140  BNP 2460.9*    ProBNP (last 3 results) No results for input(s): PROBNP in the last 8760 hours.  CBG: No results for input(s): GLUCAP in the last 168 hours.     SignedDomenic Polite  [facility] 08/01/2014, 3:20 PM

## 2014-08-01 NOTE — Progress Notes (Signed)
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 Michail Sermon is happy to see back when necessary to consider further workup and plans particularly if she remains guaiac positive  [name] E

## 2014-08-02 ENCOUNTER — Encounter (HOSPITAL_COMMUNITY): Payer: Self-pay | Admitting: Gastroenterology

## 2014-08-19 DIAGNOSIS — N186 End stage renal disease: Secondary | ICD-10-CM | POA: Diagnosis not present

## 2014-08-19 DIAGNOSIS — Z992 Dependence on renal dialysis: Secondary | ICD-10-CM | POA: Diagnosis not present

## 2014-08-20 DIAGNOSIS — D509 Iron deficiency anemia, unspecified: Secondary | ICD-10-CM | POA: Diagnosis not present

## 2014-08-20 DIAGNOSIS — N186 End stage renal disease: Secondary | ICD-10-CM | POA: Diagnosis not present

## 2014-08-20 DIAGNOSIS — N2581 Secondary hyperparathyroidism of renal origin: Secondary | ICD-10-CM | POA: Diagnosis not present

## 2014-08-20 DIAGNOSIS — M3210 Systemic lupus erythematosus, organ or system involvement unspecified: Secondary | ICD-10-CM | POA: Diagnosis not present

## 2014-08-20 DIAGNOSIS — D631 Anemia in chronic kidney disease: Secondary | ICD-10-CM | POA: Diagnosis not present

## 2014-08-26 ENCOUNTER — Encounter: Payer: Self-pay | Admitting: *Deleted

## 2014-08-26 ENCOUNTER — Other Ambulatory Visit: Payer: Self-pay | Admitting: *Deleted

## 2014-08-26 DIAGNOSIS — N186 End stage renal disease: Secondary | ICD-10-CM | POA: Insufficient documentation

## 2014-08-30 ENCOUNTER — Ambulatory Visit (INDEPENDENT_AMBULATORY_CARE_PROVIDER_SITE_OTHER): Payer: Medicare Other | Admitting: Physician Assistant

## 2014-08-30 ENCOUNTER — Encounter: Payer: Self-pay | Admitting: Physician Assistant

## 2014-08-30 VITALS — BP 180/90 | HR 85 | Ht 66.0 in | Wt 138.0 lb

## 2014-08-30 DIAGNOSIS — R7989 Other specified abnormal findings of blood chemistry: Secondary | ICD-10-CM

## 2014-08-30 DIAGNOSIS — R778 Other specified abnormalities of plasma proteins: Secondary | ICD-10-CM

## 2014-08-30 DIAGNOSIS — N186 End stage renal disease: Secondary | ICD-10-CM

## 2014-08-30 DIAGNOSIS — I050 Rheumatic mitral stenosis: Secondary | ICD-10-CM | POA: Diagnosis not present

## 2014-08-30 DIAGNOSIS — I110 Hypertensive heart disease with heart failure: Secondary | ICD-10-CM

## 2014-08-30 DIAGNOSIS — I509 Heart failure, unspecified: Secondary | ICD-10-CM

## 2014-08-30 DIAGNOSIS — I5032 Chronic diastolic (congestive) heart failure: Secondary | ICD-10-CM

## 2014-08-30 MED ORDER — LABETALOL HCL 300 MG PO TABS: 300.0000 mg | ORAL_TABLET | Freq: Two times a day (BID) | ORAL | Status: DC

## 2014-08-30 NOTE — Progress Notes (Signed)
Cardiology Office Note   Date:  08/30/2014   ID:  Wauneta Hanford, DOB January 22, 1961, MRN [id number]  PCP:  Benito Mccreedy, MD  Cardiologist:  New - Dr. Dorris Carnes     Chief Complaint  Patient presents with  . New Patient  . Hospitalization Follow-up    Elevated Troponin  . Congestive Heart Failure     History of Present Illness: [name] is a 54 y.o. female with a hx of CVA, HTN, ESRD on dialysis MWF, Lupus, dysfunctional uterine bleeding, PUD.  She was admitted last month with acute hypoxic respiratory failure and flash pulmonary edema in the setting of Influenza A pneumonia and HTN urgency.  She was treated with dialysis and Tamiflu.  She was noted to have elevated CEs.  Echo demonstrated normal LVF and no RWMA.  There was also moderate MS with a mean gradient of 9 mmHg, BAE, PASP 65 mmHg.  She was not seen by Cardiology.  Plan was to see Cardiology as an outpatient.  She had some melena during her admission and EDG demonstrated mild antritis and bulbitis, treated with PPI.    Recent Labs  07/25/14 2140 07/25/14 2153 07/26/14 0841 07/26/14 1138  TROPONINI 0.06*  --  0.27* 0.31*  TROPIPOC  --  0.05  --   --     Since discharge, she has been feeling well. She has dyspnea with more extreme activities. She is NYHA 2-2b. She denies orthopnea, PND or edema. She denies chest pain. She denies syncope. She denies significant cough. She holds her medicines on dialysis days. She had dialysis earlier today. She was previously on labetalol. This was changed to metoprolol in the hospital for unclear reasons.   Studies/Reports Reviewed Today:  Echo 07/26/14 - Mod LVH. EF 55% to 60%. Wall motion was normal.  Grade 1 diastolic dysfunction  - MAC. Moderate Mitral stenosis. Valve area by continuity equation (using LVOT flow): 1.97 cm^2.  Mean gradient (D): 9 mm Hg. Peak gradient (D): 9 mm Hg. - Mod LAE - Mild RAE - Mod to severe TR - PA peak pressure: 65 mm Hg (S). - Trivial pericardial  effusion was identified posterior to the heart.    Past Medical History  Diagnosis Date  . Hypertension   . Blood transfusion '08    [facility]  . GERD (gastroesophageal reflux disease)   . ESRD (end stage renal disease)     dialysis   Mon Wed Fri  . Lupus   . Dysfunctional uterine bleeding 12/19/2010  . Anemia   . Peptic ulcer disease   . Hx of hiatal hernia   . Mitral stenosis     Echo 4/16:  EF 55-60%, no RWMA, Gr 1 DD, mod MS (mean 9 mmHg), mod LAE, mild RAE, PASP 65, mod to severe TR, trivial eff  . Chronic diastolic CHF (congestive heart failure)     Past Surgical History  Procedure Laterality Date  . Dialysis fistula creation  2007  . Esophagogastroduodenoscopy N/A 07/31/2014    Procedure: ESOPHAGOGASTRODUODENOSCOPY (EGD);  Surgeon: Clarene Essex, MD;  Location: [facility] ENDOSCOPY;  Service: Endoscopy;  Laterality: N/A;     Current Outpatient Prescriptions  Medication Sig Dispense Refill  . amLODipine (NORVASC) 10 MG tablet Take 10 mg by mouth daily.      [name] aspirin 81 MG chewable tablet Chew 1 tablet (81 mg total) by mouth daily.    [name] b complex-vitamin c-folic acid (NEPHRO-VITE) 0.8 MG TABS tablet Take 0.8 mg by mouth at bedtime.    [name]  calcium acetate (PHOSLO) 667 MG capsule Take 667 mg by mouth 2 (two) times daily.      [name] lisinopril (PRINIVIL,ZESTRIL) 40 MG tablet Take 40 mg by mouth daily.   3  . metoprolol (LOPRESSOR) 50 MG tablet Take 50 mg by mouth 2 (two) times daily.   6  . pantoprazole (PROTONIX) 40 MG tablet Take 1 tablet (40 mg total) by mouth 2 (two) times daily. 60 tablet 0  . promethazine (PHENERGAN) 25 MG tablet Take 25 mg by mouth every 6 (six) hours as needed for nausea.    . simvastatin (ZOCOR) 40 MG tablet Take 40 mg by mouth every evening.     No current facility-administered medications for this visit.    Allergies:   Review of patient's allergies indicates no known allergies.    Social History:  The patient  reports that she has been smoking Cigarettes.  She  has been smoking about 0.25 packs per day. She does not have any smokeless tobacco history on file. She reports that she drinks alcohol. She reports that she uses illicit drugs (Marijuana).   Family History:  The patient's family history includes Liver cancer in her maternal grandmother; Lymphoma in her maternal aunt. There is no history of Heart attack.    ROS:   Please see the history of present illness.   Review of Systems  Respiratory: Positive for snoring and wheezing.   Gastrointestinal: Positive for constipation, nausea and vomiting.  All other systems reviewed and are negative.     PHYSICAL EXAM: VS:  BP 180/90 mmHg  Pulse 85  Ht 5\' 6"  (1.676 m)  Wt 138 lb (62.596 kg)  BMI 22.28 kg/m2  LMP 12/02/2010    Wt Readings from Last 3 Encounters:  08/30/14 138 lb (62.596 kg)  07/31/14 135 lb (61.236 kg)     GEN: Well nourished, well developed, in no acute distress HEENT: normal Neck: no JVD, no masses Cardiac:  Normal [id number], RRR; 1/6 systolic murmur RUSB and to/fro murmur from AVF along LSB,  no rubs or gallops, no edema  Respiratory:  clear to auscultation bilaterally, no wheezing, rhonchi or rales. GI: soft, nontender, nondistended, + BS MS: no deformity or atrophy Skin: warm and dry  Neuro:  CNs II-XII intact, Strength and sensation are intact Psych: Normal affect   EKG:  EKG is ordered today.  It demonstrates:   NSR, HR 85, normal axis, QTc 490, nonspecific ST-T wave changes   Recent Labs: 07/25/2014: B Natriuretic Peptide 2460.9* 07/26/2014: Magnesium 1.9; TSH 0.711 07/28/2014: ALT 23 07/30/2014: BUN 46*; Creatinine 8.13*; Potassium 4.1; Sodium 135 07/31/2014: Hemoglobin 11.1*; Platelets 248    Lipid Panel No results found for: CHOL, TRIG, HDL, CHOLHDL, VLDL, LDLCALC, LDLDIRECT    ASSESSMENT AND PLAN:  Chronic diastolic CHF (congestive heart failure) Volume looks stable.  Volume management per dialysis.  Hypertensive heart disease with heart failure BP  uncontrolled.  She was previously on Labetalol.  -  DC Metoprolol  -  Start back on Labetalol 300 mg Twice daily   ESRD (end stage renal disease) She is on dialysis MWF.  Mitral stenosis Plan repeat echo in 6 mos.  Elevated troponin  Likely related to demand ischemia.  She has a lot of risk factors for CAD.    -  Arrange Lexiscan Myoview  Tobacco Abuse Cessation has been recommended.  Current medicines are reviewed at length with the patient today.  The patient does not have concerns regarding medicines.  The following changes have  been made:  As above.  Labs/ tests ordered today include:   Orders Placed This Encounter  Procedures  . Myocardial Perfusion Imaging  . EKG 12-Lead  . 2D Echocardiogram with contrast    Disposition:   FU with Dr. Dorris Carnes 6 mos. (patient also seen by Dr. Dorris Carnes today).   Signed, Versie Starks, MHS 08/30/2014 4:09 PM    [facility] [address], [place], [place]  [address] Phone: [phone]; Fax: [phone]

## 2014-08-30 NOTE — Patient Instructions (Addendum)
START LABETALOL 300 MG 1 TABLET TWICE DAILY  STOP METOPROLOL   Your physician has requested that you have a lexiscan myoview. For further information please visit [email or web address]. Please follow instruction sheet, as given.  Your physician has requested that you have an echocardiogram WITH CONTRAST; THIS IS TO BE DONE IN 6 MONTHS. Echocardiography is a painless test that uses sound waves to create images of your heart. It provides your doctor with information about the size and shape of your heart and how well your heart's chambers and valves are working. This procedure takes approximately one hour. There are no restrictions for this procedure  PER DR. ROSS TO FOLLOW UP WITH HER IN October 2016

## 2014-09-07 ENCOUNTER — Ambulatory Visit (HOSPITAL_COMMUNITY): Payer: Medicare Other | Attending: Cardiology | Admitting: Radiology

## 2014-09-07 ENCOUNTER — Other Ambulatory Visit (HOSPITAL_COMMUNITY): Payer: Medicare Other | Admitting: *Deleted

## 2014-09-07 DIAGNOSIS — R7989 Other specified abnormal findings of blood chemistry: Secondary | ICD-10-CM | POA: Diagnosis not present

## 2014-09-07 DIAGNOSIS — R110 Nausea: Secondary | ICD-10-CM

## 2014-09-07 DIAGNOSIS — I5022 Chronic systolic (congestive) heart failure: Secondary | ICD-10-CM

## 2014-09-07 DIAGNOSIS — I110 Hypertensive heart disease with heart failure: Secondary | ICD-10-CM

## 2014-09-07 DIAGNOSIS — I10 Essential (primary) hypertension: Secondary | ICD-10-CM | POA: Insufficient documentation

## 2014-09-07 DIAGNOSIS — R778 Other specified abnormalities of plasma proteins: Secondary | ICD-10-CM

## 2014-09-07 DIAGNOSIS — R079 Chest pain, unspecified: Secondary | ICD-10-CM

## 2014-09-07 DIAGNOSIS — R0609 Other forms of dyspnea: Secondary | ICD-10-CM | POA: Diagnosis not present

## 2014-09-07 DIAGNOSIS — R9431 Abnormal electrocardiogram [ECG] [EKG]: Secondary | ICD-10-CM | POA: Insufficient documentation

## 2014-09-07 DIAGNOSIS — I050 Rheumatic mitral stenosis: Secondary | ICD-10-CM

## 2014-09-07 DIAGNOSIS — I5032 Chronic diastolic (congestive) heart failure: Secondary | ICD-10-CM

## 2014-09-07 MED ORDER — TECHNETIUM TC 99M SESTAMIBI GENERIC - CARDIOLITE
30.0000 | Freq: Once | INTRAVENOUS | Status: AC | PRN
  Administered 2014-09-07: 30 via INTRAVENOUS

## 2014-09-07 MED ORDER — TECHNETIUM TC 99M SESTAMIBI GENERIC - CARDIOLITE
11.0000 | Freq: Once | INTRAVENOUS | Status: AC | PRN
  Administered 2014-09-07: 11 via INTRAVENOUS

## 2014-09-07 MED ORDER — AMINOPHYLLINE 25 MG/ML IV SOLN
75.0000 mg | Freq: Once | INTRAVENOUS | Status: AC
Start: 2014-09-07 — End: 2014-09-07
  Administered 2014-09-07: 75 mg via INTRAVENOUS

## 2014-09-07 MED ORDER — REGADENOSON 0.4 MG/5ML IV SOLN
0.4000 mg | Freq: Once | INTRAVENOUS | Status: AC
  Administered 2014-09-07: 0.4 mg via INTRAVENOUS

## 2014-09-07 NOTE — Progress Notes (Signed)
[facility] [address] [place], [place] [address] [phone]    Cardiology Nuclear Med Study  [name] is a 54 y.o. female     MRN : [id number]     DOB: 05/22/1960  Procedure Date: 09/07/2014  Nuclear Med Background Indication for Stress Test:  Evaluation for Ischemia and Abnormal EKG History:  CAD Cardiac Risk Factors: Hypertension  Symptoms:  DOE  Nuclear Pre-Procedure Caffeine/Decaff Intake:  None NPO After: 11:00pm   Lungs:  clear O2 Sat: 98% on room air. IV 0.9% NS with Angio Cath:  22g  IV Site: L Hand  IV Started by:  Matilde Haymaker, RN  Chest Size (in):  34 Cup Size: C  Height: 5\' 6"  (1.676 m)  Weight:  141 lb (63.957 kg)  BMI:  Body mass index is 22.77 kg/(m^2). Tech Comments:  No Labetalol x 8 hrs    Nuclear Med Study 1 or 2 day study: 1 day  Stress Test Type:  Treadmill/Lexiscan  Reading MD: n/a  Order Authorizing Provider:  Nevin Bloodgood Ross,MD and Chelsea Primus  Resting Radionuclide: Technetium 82m Sestamibi  Resting Radionuclide Dose: 11.0 mCi   Stress Radionuclide:  Technetium 6m Sestamibi  Stress Radionuclide Dose: 33.0 mCi           Stress Protocol Rest HR: 80 Stress HR: 112  Rest BP: 172/95 Stress BP: 150/76  Exercise Time (min): n/a METS: n/a    Dose of Adenosine (mg):  n/a Dose of Lexiscan: 0.4 mg  Dose of Atropine (mg): n/a Dose of Dobutamine: n/a mcg/kg/min (at max HR)  Stress Test Technologist: Glade Lloyd, BS-ES  Nuclear Technologist:  Earl Many, CNMT     Rest Procedure:  Myocardial perfusion imaging was performed at rest 45 minutes following the intravenous administration of Technetium 17m Sestamibi. Rest ECG: NSR - Normal EKG  Stress Procedure:  The patient received IV Lexiscan 0.4 mg over 15-seconds with concurrent low level exercise and then Technetium 51m Sestamibi was injected at 30-seconds while the patient continued walking one more minute.  Quantitative spect images were obtained after a  45-minute delay.  During the infusion of Lexiscan the patient became SOB, fatigue and nauseated.  Just into recovery the patient began to vomit vehemently.  Administered 75 mg aminophylline and the patient began to feel better within two minutes.    Stress ECG: No significant change from baseline ECG  QPS Raw Data Images:  Normal; no motion artifact; normal heart/lung ratio. Stress Images:  Normal homogeneous uptake in all areas of the myocardium. Rest Images:  Normal homogeneous uptake in all areas of the myocardium. Subtraction (SDS):  Normal Transient Ischemic Dilatation (Normal <1.22):  1.13 Lung/Heart Ratio (Normal <0.45):  0.35  Quantitative Gated Spect Images QGS EDV:  143 ml QGS ESV:  58 ml  Impression Exercise Capacity:  Lexiscan with low level exercise. BP Response:  Hypertensive blood pressure response. Clinical Symptoms:  No significant symptoms noted. ECG Impression:  No significant ST segment change suggestive of ischemia. Comparison with Prior Nuclear Study: No previous nuclear study performed  Overall Impression:  Normal stress nuclear study.  LV Ejection Fraction: 59%.  LV Wall Motion:  NL LV Function; NL Wall Motion   [name] 09/07/2014

## 2014-09-08 ENCOUNTER — Telehealth: Payer: Self-pay | Admitting: *Deleted

## 2014-09-08 ENCOUNTER — Encounter: Payer: Self-pay | Admitting: Physician Assistant

## 2014-09-08 NOTE — Telephone Encounter (Signed)
pt notified about myoview normal. Pt said thank you with verbal understanding to results given today.

## 2014-09-18 DIAGNOSIS — I120 Hypertensive chronic kidney disease with stage 5 chronic kidney disease or end stage renal disease: Secondary | ICD-10-CM | POA: Diagnosis not present

## 2014-09-18 DIAGNOSIS — N186 End stage renal disease: Secondary | ICD-10-CM | POA: Diagnosis not present

## 2014-09-18 DIAGNOSIS — Z992 Dependence on renal dialysis: Secondary | ICD-10-CM | POA: Diagnosis not present

## 2014-09-20 DIAGNOSIS — D631 Anemia in chronic kidney disease: Secondary | ICD-10-CM | POA: Diagnosis not present

## 2014-09-20 DIAGNOSIS — D509 Iron deficiency anemia, unspecified: Secondary | ICD-10-CM | POA: Diagnosis not present

## 2014-09-20 DIAGNOSIS — M3210 Systemic lupus erythematosus, organ or system involvement unspecified: Secondary | ICD-10-CM | POA: Diagnosis not present

## 2014-09-20 DIAGNOSIS — N186 End stage renal disease: Secondary | ICD-10-CM | POA: Diagnosis not present

## 2014-09-20 DIAGNOSIS — N2581 Secondary hyperparathyroidism of renal origin: Secondary | ICD-10-CM | POA: Diagnosis not present

## 2014-10-19 DIAGNOSIS — Z992 Dependence on renal dialysis: Secondary | ICD-10-CM | POA: Diagnosis not present

## 2014-10-19 DIAGNOSIS — I120 Hypertensive chronic kidney disease with stage 5 chronic kidney disease or end stage renal disease: Secondary | ICD-10-CM | POA: Diagnosis not present

## 2014-10-19 DIAGNOSIS — N186 End stage renal disease: Secondary | ICD-10-CM | POA: Diagnosis not present

## 2014-10-20 DIAGNOSIS — N186 End stage renal disease: Secondary | ICD-10-CM | POA: Diagnosis not present

## 2014-10-20 DIAGNOSIS — D509 Iron deficiency anemia, unspecified: Secondary | ICD-10-CM | POA: Diagnosis not present

## 2014-10-20 DIAGNOSIS — M3210 Systemic lupus erythematosus, organ or system involvement unspecified: Secondary | ICD-10-CM | POA: Diagnosis not present

## 2014-10-20 DIAGNOSIS — D631 Anemia in chronic kidney disease: Secondary | ICD-10-CM | POA: Diagnosis not present

## 2014-10-20 DIAGNOSIS — N2581 Secondary hyperparathyroidism of renal origin: Secondary | ICD-10-CM | POA: Diagnosis not present

## 2014-11-18 DIAGNOSIS — Z992 Dependence on renal dialysis: Secondary | ICD-10-CM | POA: Diagnosis not present

## 2014-11-18 DIAGNOSIS — I120 Hypertensive chronic kidney disease with stage 5 chronic kidney disease or end stage renal disease: Secondary | ICD-10-CM | POA: Diagnosis not present

## 2014-11-18 DIAGNOSIS — N186 End stage renal disease: Secondary | ICD-10-CM | POA: Diagnosis not present

## 2014-11-19 DIAGNOSIS — M3210 Systemic lupus erythematosus, organ or system involvement unspecified: Secondary | ICD-10-CM | POA: Diagnosis not present

## 2014-11-19 DIAGNOSIS — D631 Anemia in chronic kidney disease: Secondary | ICD-10-CM | POA: Diagnosis not present

## 2014-11-19 DIAGNOSIS — N2581 Secondary hyperparathyroidism of renal origin: Secondary | ICD-10-CM | POA: Diagnosis not present

## 2014-11-19 DIAGNOSIS — D509 Iron deficiency anemia, unspecified: Secondary | ICD-10-CM | POA: Diagnosis not present

## 2014-11-19 DIAGNOSIS — N186 End stage renal disease: Secondary | ICD-10-CM | POA: Diagnosis not present

## 2014-12-09 ENCOUNTER — Other Ambulatory Visit (HOSPITAL_COMMUNITY): Payer: Self-pay | Admitting: Obstetrics

## 2014-12-09 DIAGNOSIS — Z1231 Encounter for screening mammogram for malignant neoplasm of breast: Secondary | ICD-10-CM

## 2014-12-16 ENCOUNTER — Ambulatory Visit (HOSPITAL_COMMUNITY)
Admission: RE | Admit: 2014-12-16 | Discharge: 2014-12-16 | Disposition: A | Discharge status: Home / Self Care | Payer: Medicare Other | Source: Ambulatory Visit | Attending: Obstetrics | Admitting: Obstetrics

## 2014-12-16 DIAGNOSIS — Z1231 Encounter for screening mammogram for malignant neoplasm of breast: Secondary | ICD-10-CM | POA: Insufficient documentation

## 2014-12-19 DIAGNOSIS — N186 End stage renal disease: Secondary | ICD-10-CM | POA: Diagnosis not present

## 2014-12-19 DIAGNOSIS — Z992 Dependence on renal dialysis: Secondary | ICD-10-CM | POA: Diagnosis not present

## 2014-12-19 DIAGNOSIS — I120 Hypertensive chronic kidney disease with stage 5 chronic kidney disease or end stage renal disease: Secondary | ICD-10-CM | POA: Diagnosis not present

## 2014-12-20 ENCOUNTER — Other Ambulatory Visit: Payer: Self-pay | Admitting: Obstetrics

## 2014-12-20 DIAGNOSIS — D509 Iron deficiency anemia, unspecified: Secondary | ICD-10-CM | POA: Diagnosis not present

## 2014-12-20 DIAGNOSIS — D631 Anemia in chronic kidney disease: Secondary | ICD-10-CM | POA: Diagnosis not present

## 2014-12-20 DIAGNOSIS — N186 End stage renal disease: Secondary | ICD-10-CM | POA: Diagnosis not present

## 2014-12-20 DIAGNOSIS — R928 Other abnormal and inconclusive findings on diagnostic imaging of breast: Secondary | ICD-10-CM

## 2014-12-20 DIAGNOSIS — M3210 Systemic lupus erythematosus, organ or system involvement unspecified: Secondary | ICD-10-CM | POA: Diagnosis not present

## 2014-12-20 DIAGNOSIS — N2581 Secondary hyperparathyroidism of renal origin: Secondary | ICD-10-CM | POA: Diagnosis not present

## 2014-12-23 ENCOUNTER — Ambulatory Visit
Admission: RE | Admit: 2014-12-23 | Discharge: 2014-12-23 | Disposition: A | Discharge status: Home / Self Care | Payer: Medicare Other | Source: Ambulatory Visit | Attending: Obstetrics | Admitting: Obstetrics

## 2014-12-23 DIAGNOSIS — R928 Other abnormal and inconclusive findings on diagnostic imaging of breast: Secondary | ICD-10-CM

## 2014-12-23 DIAGNOSIS — N6002 Solitary cyst of left breast: Secondary | ICD-10-CM | POA: Diagnosis not present

## 2014-12-23 DIAGNOSIS — N63 Unspecified lump in breast: Secondary | ICD-10-CM | POA: Diagnosis not present

## 2015-01-19 DIAGNOSIS — N186 End stage renal disease: Secondary | ICD-10-CM | POA: Diagnosis not present

## 2015-01-19 DIAGNOSIS — I120 Hypertensive chronic kidney disease with stage 5 chronic kidney disease or end stage renal disease: Secondary | ICD-10-CM | POA: Diagnosis not present

## 2015-01-19 DIAGNOSIS — Z992 Dependence on renal dialysis: Secondary | ICD-10-CM | POA: Diagnosis not present

## 2015-01-21 DIAGNOSIS — N2581 Secondary hyperparathyroidism of renal origin: Secondary | ICD-10-CM | POA: Diagnosis not present

## 2015-01-21 DIAGNOSIS — M3210 Systemic lupus erythematosus, organ or system involvement unspecified: Secondary | ICD-10-CM | POA: Diagnosis not present

## 2015-01-21 DIAGNOSIS — N186 End stage renal disease: Secondary | ICD-10-CM | POA: Diagnosis not present

## 2015-01-21 DIAGNOSIS — D631 Anemia in chronic kidney disease: Secondary | ICD-10-CM | POA: Diagnosis not present

## 2015-01-21 DIAGNOSIS — D509 Iron deficiency anemia, unspecified: Secondary | ICD-10-CM | POA: Diagnosis not present

## 2015-02-18 DIAGNOSIS — I120 Hypertensive chronic kidney disease with stage 5 chronic kidney disease or end stage renal disease: Secondary | ICD-10-CM | POA: Diagnosis not present

## 2015-02-18 DIAGNOSIS — Z992 Dependence on renal dialysis: Secondary | ICD-10-CM | POA: Diagnosis not present

## 2015-02-18 DIAGNOSIS — N186 End stage renal disease: Secondary | ICD-10-CM | POA: Diagnosis not present

## 2015-02-21 DIAGNOSIS — N186 End stage renal disease: Secondary | ICD-10-CM | POA: Diagnosis not present

## 2015-02-21 DIAGNOSIS — D509 Iron deficiency anemia, unspecified: Secondary | ICD-10-CM | POA: Diagnosis not present

## 2015-02-21 DIAGNOSIS — N2581 Secondary hyperparathyroidism of renal origin: Secondary | ICD-10-CM | POA: Diagnosis not present

## 2015-02-21 DIAGNOSIS — Z23 Encounter for immunization: Secondary | ICD-10-CM | POA: Diagnosis not present

## 2015-02-21 DIAGNOSIS — D631 Anemia in chronic kidney disease: Secondary | ICD-10-CM | POA: Diagnosis not present

## 2015-02-21 DIAGNOSIS — M3210 Systemic lupus erythematosus, organ or system involvement unspecified: Secondary | ICD-10-CM | POA: Diagnosis not present

## 2015-02-23 DIAGNOSIS — D509 Iron deficiency anemia, unspecified: Secondary | ICD-10-CM | POA: Diagnosis not present

## 2015-02-23 DIAGNOSIS — N186 End stage renal disease: Secondary | ICD-10-CM | POA: Diagnosis not present

## 2015-02-23 DIAGNOSIS — N2581 Secondary hyperparathyroidism of renal origin: Secondary | ICD-10-CM | POA: Diagnosis not present

## 2015-02-23 DIAGNOSIS — D631 Anemia in chronic kidney disease: Secondary | ICD-10-CM | POA: Diagnosis not present

## 2015-02-23 DIAGNOSIS — M3210 Systemic lupus erythematosus, organ or system involvement unspecified: Secondary | ICD-10-CM | POA: Diagnosis not present

## 2015-02-23 DIAGNOSIS — Z23 Encounter for immunization: Secondary | ICD-10-CM | POA: Diagnosis not present

## 2015-02-25 ENCOUNTER — Ambulatory Visit: Payer: Medicare Other | Admitting: Internal Medicine

## 2015-02-25 DIAGNOSIS — N186 End stage renal disease: Secondary | ICD-10-CM | POA: Diagnosis not present

## 2015-02-25 DIAGNOSIS — D631 Anemia in chronic kidney disease: Secondary | ICD-10-CM | POA: Diagnosis not present

## 2015-02-25 DIAGNOSIS — N2581 Secondary hyperparathyroidism of renal origin: Secondary | ICD-10-CM | POA: Diagnosis not present

## 2015-02-25 DIAGNOSIS — D509 Iron deficiency anemia, unspecified: Secondary | ICD-10-CM | POA: Diagnosis not present

## 2015-02-25 DIAGNOSIS — Z23 Encounter for immunization: Secondary | ICD-10-CM | POA: Diagnosis not present

## 2015-02-25 DIAGNOSIS — M3210 Systemic lupus erythematosus, organ or system involvement unspecified: Secondary | ICD-10-CM | POA: Diagnosis not present

## 2015-02-28 DIAGNOSIS — N2581 Secondary hyperparathyroidism of renal origin: Secondary | ICD-10-CM | POA: Diagnosis not present

## 2015-02-28 DIAGNOSIS — N186 End stage renal disease: Secondary | ICD-10-CM | POA: Diagnosis not present

## 2015-02-28 DIAGNOSIS — M3210 Systemic lupus erythematosus, organ or system involvement unspecified: Secondary | ICD-10-CM | POA: Diagnosis not present

## 2015-02-28 DIAGNOSIS — Z23 Encounter for immunization: Secondary | ICD-10-CM | POA: Diagnosis not present

## 2015-02-28 DIAGNOSIS — D509 Iron deficiency anemia, unspecified: Secondary | ICD-10-CM | POA: Diagnosis not present

## 2015-02-28 DIAGNOSIS — D631 Anemia in chronic kidney disease: Secondary | ICD-10-CM | POA: Diagnosis not present

## 2015-03-02 ENCOUNTER — Encounter: Payer: Self-pay | Admitting: Internal Medicine

## 2015-03-02 DIAGNOSIS — D509 Iron deficiency anemia, unspecified: Secondary | ICD-10-CM | POA: Diagnosis not present

## 2015-03-02 DIAGNOSIS — D631 Anemia in chronic kidney disease: Secondary | ICD-10-CM | POA: Diagnosis not present

## 2015-03-02 DIAGNOSIS — N186 End stage renal disease: Secondary | ICD-10-CM | POA: Diagnosis not present

## 2015-03-02 DIAGNOSIS — N2581 Secondary hyperparathyroidism of renal origin: Secondary | ICD-10-CM | POA: Diagnosis not present

## 2015-03-02 DIAGNOSIS — M3210 Systemic lupus erythematosus, organ or system involvement unspecified: Secondary | ICD-10-CM | POA: Diagnosis not present

## 2015-03-02 DIAGNOSIS — Z23 Encounter for immunization: Secondary | ICD-10-CM | POA: Diagnosis not present

## 2015-03-03 ENCOUNTER — Other Ambulatory Visit (HOSPITAL_COMMUNITY): Payer: Medicare Other

## 2015-03-04 DIAGNOSIS — N186 End stage renal disease: Secondary | ICD-10-CM | POA: Diagnosis not present

## 2015-03-04 DIAGNOSIS — D631 Anemia in chronic kidney disease: Secondary | ICD-10-CM | POA: Diagnosis not present

## 2015-03-04 DIAGNOSIS — Z23 Encounter for immunization: Secondary | ICD-10-CM | POA: Diagnosis not present

## 2015-03-04 DIAGNOSIS — M3210 Systemic lupus erythematosus, organ or system involvement unspecified: Secondary | ICD-10-CM | POA: Diagnosis not present

## 2015-03-04 DIAGNOSIS — N2581 Secondary hyperparathyroidism of renal origin: Secondary | ICD-10-CM | POA: Diagnosis not present

## 2015-03-04 DIAGNOSIS — D509 Iron deficiency anemia, unspecified: Secondary | ICD-10-CM | POA: Diagnosis not present

## 2015-03-07 DIAGNOSIS — N2581 Secondary hyperparathyroidism of renal origin: Secondary | ICD-10-CM | POA: Diagnosis not present

## 2015-03-07 DIAGNOSIS — D631 Anemia in chronic kidney disease: Secondary | ICD-10-CM | POA: Diagnosis not present

## 2015-03-07 DIAGNOSIS — Z23 Encounter for immunization: Secondary | ICD-10-CM | POA: Diagnosis not present

## 2015-03-07 DIAGNOSIS — D509 Iron deficiency anemia, unspecified: Secondary | ICD-10-CM | POA: Diagnosis not present

## 2015-03-07 DIAGNOSIS — M3210 Systemic lupus erythematosus, organ or system involvement unspecified: Secondary | ICD-10-CM | POA: Diagnosis not present

## 2015-03-07 DIAGNOSIS — N186 End stage renal disease: Secondary | ICD-10-CM | POA: Diagnosis not present

## 2015-03-09 DIAGNOSIS — N186 End stage renal disease: Secondary | ICD-10-CM | POA: Diagnosis not present

## 2015-03-09 DIAGNOSIS — D509 Iron deficiency anemia, unspecified: Secondary | ICD-10-CM | POA: Diagnosis not present

## 2015-03-09 DIAGNOSIS — D631 Anemia in chronic kidney disease: Secondary | ICD-10-CM | POA: Diagnosis not present

## 2015-03-09 DIAGNOSIS — Z23 Encounter for immunization: Secondary | ICD-10-CM | POA: Diagnosis not present

## 2015-03-09 DIAGNOSIS — N2581 Secondary hyperparathyroidism of renal origin: Secondary | ICD-10-CM | POA: Diagnosis not present

## 2015-03-09 DIAGNOSIS — M3210 Systemic lupus erythematosus, organ or system involvement unspecified: Secondary | ICD-10-CM | POA: Diagnosis not present

## 2015-03-11 DIAGNOSIS — D631 Anemia in chronic kidney disease: Secondary | ICD-10-CM | POA: Diagnosis not present

## 2015-03-11 DIAGNOSIS — N186 End stage renal disease: Secondary | ICD-10-CM | POA: Diagnosis not present

## 2015-03-11 DIAGNOSIS — D509 Iron deficiency anemia, unspecified: Secondary | ICD-10-CM | POA: Diagnosis not present

## 2015-03-11 DIAGNOSIS — N2581 Secondary hyperparathyroidism of renal origin: Secondary | ICD-10-CM | POA: Diagnosis not present

## 2015-03-11 DIAGNOSIS — M3210 Systemic lupus erythematosus, organ or system involvement unspecified: Secondary | ICD-10-CM | POA: Diagnosis not present

## 2015-03-11 DIAGNOSIS — Z23 Encounter for immunization: Secondary | ICD-10-CM | POA: Diagnosis not present

## 2015-03-14 DIAGNOSIS — Z23 Encounter for immunization: Secondary | ICD-10-CM | POA: Diagnosis not present

## 2015-03-14 DIAGNOSIS — M3210 Systemic lupus erythematosus, organ or system involvement unspecified: Secondary | ICD-10-CM | POA: Diagnosis not present

## 2015-03-14 DIAGNOSIS — D631 Anemia in chronic kidney disease: Secondary | ICD-10-CM | POA: Diagnosis not present

## 2015-03-14 DIAGNOSIS — D509 Iron deficiency anemia, unspecified: Secondary | ICD-10-CM | POA: Diagnosis not present

## 2015-03-14 DIAGNOSIS — N2581 Secondary hyperparathyroidism of renal origin: Secondary | ICD-10-CM | POA: Diagnosis not present

## 2015-03-14 DIAGNOSIS — N186 End stage renal disease: Secondary | ICD-10-CM | POA: Diagnosis not present

## 2015-03-15 ENCOUNTER — Other Ambulatory Visit (HOSPITAL_COMMUNITY): Payer: Medicare Other

## 2015-03-16 DIAGNOSIS — N2581 Secondary hyperparathyroidism of renal origin: Secondary | ICD-10-CM | POA: Diagnosis not present

## 2015-03-16 DIAGNOSIS — D509 Iron deficiency anemia, unspecified: Secondary | ICD-10-CM | POA: Diagnosis not present

## 2015-03-16 DIAGNOSIS — M3210 Systemic lupus erythematosus, organ or system involvement unspecified: Secondary | ICD-10-CM | POA: Diagnosis not present

## 2015-03-16 DIAGNOSIS — Z23 Encounter for immunization: Secondary | ICD-10-CM | POA: Diagnosis not present

## 2015-03-16 DIAGNOSIS — N186 End stage renal disease: Secondary | ICD-10-CM | POA: Diagnosis not present

## 2015-03-16 DIAGNOSIS — D631 Anemia in chronic kidney disease: Secondary | ICD-10-CM | POA: Diagnosis not present

## 2015-03-18 DIAGNOSIS — N186 End stage renal disease: Secondary | ICD-10-CM | POA: Diagnosis not present

## 2015-03-18 DIAGNOSIS — M3210 Systemic lupus erythematosus, organ or system involvement unspecified: Secondary | ICD-10-CM | POA: Diagnosis not present

## 2015-03-18 DIAGNOSIS — D631 Anemia in chronic kidney disease: Secondary | ICD-10-CM | POA: Diagnosis not present

## 2015-03-18 DIAGNOSIS — Z23 Encounter for immunization: Secondary | ICD-10-CM | POA: Diagnosis not present

## 2015-03-18 DIAGNOSIS — N2581 Secondary hyperparathyroidism of renal origin: Secondary | ICD-10-CM | POA: Diagnosis not present

## 2015-03-18 DIAGNOSIS — D509 Iron deficiency anemia, unspecified: Secondary | ICD-10-CM | POA: Diagnosis not present

## 2015-03-21 DIAGNOSIS — I120 Hypertensive chronic kidney disease with stage 5 chronic kidney disease or end stage renal disease: Secondary | ICD-10-CM | POA: Diagnosis not present

## 2015-03-21 DIAGNOSIS — Z992 Dependence on renal dialysis: Secondary | ICD-10-CM | POA: Diagnosis not present

## 2015-03-21 DIAGNOSIS — N186 End stage renal disease: Secondary | ICD-10-CM | POA: Diagnosis not present

## 2015-03-22 DIAGNOSIS — D631 Anemia in chronic kidney disease: Secondary | ICD-10-CM | POA: Diagnosis not present

## 2015-03-22 DIAGNOSIS — D509 Iron deficiency anemia, unspecified: Secondary | ICD-10-CM | POA: Diagnosis not present

## 2015-03-22 DIAGNOSIS — M3210 Systemic lupus erythematosus, organ or system involvement unspecified: Secondary | ICD-10-CM | POA: Diagnosis not present

## 2015-03-22 DIAGNOSIS — N2581 Secondary hyperparathyroidism of renal origin: Secondary | ICD-10-CM | POA: Diagnosis not present

## 2015-03-22 DIAGNOSIS — N186 End stage renal disease: Secondary | ICD-10-CM | POA: Diagnosis not present

## 2015-04-19 ENCOUNTER — Ambulatory Visit (HOSPITAL_COMMUNITY): Payer: Medicare Other | Attending: Physician Assistant

## 2015-04-19 DIAGNOSIS — R0989 Other specified symptoms and signs involving the circulatory and respiratory systems: Secondary | ICD-10-CM

## 2015-04-20 DIAGNOSIS — I120 Hypertensive chronic kidney disease with stage 5 chronic kidney disease or end stage renal disease: Secondary | ICD-10-CM | POA: Diagnosis not present

## 2015-04-20 DIAGNOSIS — Z992 Dependence on renal dialysis: Secondary | ICD-10-CM | POA: Diagnosis not present

## 2015-04-20 DIAGNOSIS — N186 End stage renal disease: Secondary | ICD-10-CM | POA: Diagnosis not present

## 2015-04-22 DIAGNOSIS — M3210 Systemic lupus erythematosus, organ or system involvement unspecified: Secondary | ICD-10-CM | POA: Diagnosis not present

## 2015-04-22 DIAGNOSIS — N2581 Secondary hyperparathyroidism of renal origin: Secondary | ICD-10-CM | POA: Diagnosis not present

## 2015-04-22 DIAGNOSIS — D631 Anemia in chronic kidney disease: Secondary | ICD-10-CM | POA: Diagnosis not present

## 2015-04-22 DIAGNOSIS — N186 End stage renal disease: Secondary | ICD-10-CM | POA: Diagnosis not present

## 2015-04-22 DIAGNOSIS — D509 Iron deficiency anemia, unspecified: Secondary | ICD-10-CM | POA: Diagnosis not present

## 2015-04-25 ENCOUNTER — Ambulatory Visit: Payer: Medicare Other | Admitting: Physician Assistant

## 2015-04-25 NOTE — Progress Notes (Signed)
Cardiology Office Note   Date:  04/26/2015   ID:  Lauralei Troughton, DOB [birth year], MRN [id number]  PCP:  Benito Mccreedy, MD  Cardiologist:  Dr. Harrington Challenger   Follow up    History of Present Illness: [name] is a 54 y.o. female with a hx of CVA, mitral stenosis, HTN, ESRD on dialysis MWF, Lupus, dysfunctional uterine bleeding, PUD who presents to clinic for follow up.   She was admitted 07/2014 with acute hypoxic respiratory failure and flash pulmonary edema in the setting of Influenza A pneumonia and HTN urgency. She was treated with dialysis and Tamiflu. She was noted to have elevated CEs. Echo demonstrated normal LVF and no RWMA. There was also moderate MS with a mean gradient of 9 mmHg, BAE, PASP 65 mmHg. She was not seen by Cardiology. Plan was to see Cardiology as an outpatient. She had some melena during her admission and EDG demonstrated mild antritis and bulbitis, treated with PPI.   She was seen by Richardson Dopp PA-C on 08/30/14 and underwent nuclear stress test, which was normal.   She has been complaint with her medications and dialysis. No CP or SOB. She only gets a little SOB when she gets too much fluid, but she watches her salt and water intake. She has her BP measured at dialysis and it has been high lately. Still on Norvasc 10mg  po qd, Labetalol 300 TID ( increased by nephrology about 1 month ago), lisinopril 40mg  po qd. She never had problems with hypotension during HD. Today 170/84 today.  No LE edema, orthopnea or PND. No syncope. Occasional palpations. No complaints. She feels like is totally ready to quit smoking this year.    Past Medical History  Diagnosis Date  . Hypertension   . Blood transfusion '08    [facility]  . GERD (gastroesophageal reflux disease)   . ESRD (end stage renal disease) ([place])     dialysis   Mon Wed Fri  . Lupus ([place])   . Dysfunctional uterine bleeding 12/19/2010  . Anemia   . Peptic ulcer disease   . Hx of hiatal hernia   . Mitral  stenosis     Echo 4/16:  EF 55-60%, no RWMA, Gr 1 DD, mod MS (mean 9 mmHg), mod LAE, mild RAE, PASP 65, mod to severe TR, trivial eff  . Chronic diastolic CHF (congestive heart failure) ([place])   . Hx of cardiovascular stress test     Lexiscan Myoview 4/16:  Normal stress nuclear study, EF 59%    Past Surgical History  Procedure Laterality Date  . Dialysis fistula creation  2007  . Esophagogastroduodenoscopy N/A 07/31/2014    Procedure: ESOPHAGOGASTRODUODENOSCOPY (EGD);  Surgeon: Clarene Essex, MD;  Location: [facility] ENDOSCOPY;  Service: Endoscopy;  Laterality: N/A;     Current Outpatient Prescriptions  Medication Sig Dispense Refill  . amLODipine (NORVASC) 10 MG tablet Take 10 mg by mouth daily.      [name] aspirin 81 MG chewable tablet Chew 1 tablet (81 mg total) by mouth daily.    [name] b complex-vitamin c-folic acid (NEPHRO-VITE) 0.8 MG TABS tablet Take 0.8 mg by mouth at bedtime.    . calcium acetate (PHOSLO) 667 MG capsule Take 667 mg by mouth 2 (two) times daily.      [name] labetalol (NORMODYNE) 100 MG tablet Take 100 mg by mouth 3 (three) times daily.    [name] lisinopril (PRINIVIL,ZESTRIL) 40 MG tablet Take 40 mg by mouth daily.   3  . pantoprazole (PROTONIX) 40  MG tablet Take 1 tablet (40 mg total) by mouth 2 (two) times daily. 60 tablet 0  . promethazine (PHENERGAN) 25 MG tablet Take 25 mg by mouth every 6 (six) hours as needed for nausea.    . simvastatin (ZOCOR) 40 MG tablet Take 40 mg by mouth every evening.    . cloNIDine (CATAPRES) 0.1 MG tablet Take 1 tablet (0.1 mg total) by mouth 2 (two) times daily. 180 tablet 0   No current facility-administered medications for this visit.    Allergies:   Review of patient's allergies indicates no known allergies.    Social History:  The patient  reports that she has been smoking Cigarettes.  She has been smoking about 0.25 packs per day. She does not have any smokeless tobacco history on file. She reports that she drinks alcohol. She reports that she uses  illicit drugs (Marijuana).   Family History:  The patient's family history includes Liver cancer in her maternal grandmother; Lymphoma in her maternal aunt. There is no history of Heart attack.    ROS:  Please see the history of present illness.   Otherwise, review of systems are positive for none.   All other systems are reviewed and negative.    PHYSICAL EXAM: VS:  BP 170/84 mmHg  Pulse 77  Wt 137 lb 3.2 oz (62.234 kg)  LMP 12/02/2010 , BMI Body mass index is 22.16 kg/(m^2). GEN: Well nourished, well developed, in no acute distress HEENT: normal Neck: no JVD, carotid bruits, or masses Cardiac: RRR; no murmurs, rubs, or gallops,no edema. 1/6 systolic murmur RUSB and to/fro murmur from AVF along LSB Respiratory:  clear to auscultation bilaterally, normal work of breathing GI: soft, nontender, nondistended, + BS MS: no deformity or atrophy Skin: warm and dry, no rash Neuro:  Strength and sensation are intact Psych: euthymic mood, full affect   EKG:  EKG is ordered today. The ekg ordered today demonstrates NSR, LVH,    Recent Labs: 07/25/2014: B Natriuretic Peptide 2460.9* 07/26/2014: Magnesium 1.9; TSH 0.711 07/28/2014: ALT 23 07/30/2014: BUN 46*; Creatinine, Ser 8.13*; Potassium 4.1; Sodium 135 07/31/2014: Hemoglobin 11.1*; Platelets 248    Lipid Panel No results found for: CHOL, TRIG, HDL, CHOLHDL, VLDL, LDLCALC, LDLDIRECT    Wt Readings from Last 3 Encounters:  04/26/15 137 lb 3.2 oz (62.234 kg)  09/07/14 141 lb (63.957 kg)  08/30/14 138 lb (62.596 kg)      Studies/Reports Reviewed Today:  Echo 07/26/14 - Mod LVH. EF 55% to 60%. Wall motion was normal. Grade 1 diastolic dysfunction  - MAC. Moderate Mitral stenosis. Valve area by continuity equation (using LVOT flow): 1.97 cm^2. Mean gradient (D): 9 mm Hg. Peak gradient (D): 9 mm Hg. - Mod LAE - Mild RAE - Mod to severe TR - PApeak pressure: 65 mm Hg (S). - Trivial pericardial effusion was identified posterior to  the heart.   MPS (09/07/14): Normal stress nuclear study.vLV Ejection Fraction: 59%. LV Wall Motion: NL LV Function; NL Wall Motion   ASSESSMENT AND PLAN:  Kylar Fahl is a 54 y.o. female with a hx of CVA, mitral stenosis, HTN, ESRD on dialysis MWF, Lupus, dysfunctional uterine bleeding, PUD who presents to clinic for follow up.   Chronic diastolic CHF  Volume looks stable. Volume management per dialysis.  Hypertensive heart disease with heart failure - Continue Norvasc 10mg  po qd, Labetalol 300 TID ( increased by nephrology about 1 month ago), lisinopril 40mg  po qd. Still with consistently elevated BPs.  - Will  start clonidine 0.1mg  BID and continue to monitor pressures.   ESRD- she is on dialysis MWF.  Mitral stenosis- plan repeat echo in 6 mos at last visit. We will repeat now.   Elevated troponin - during hospitalization in 07/2014. F/u stress test in 08/2014 normal.   Tobacco Abuse- cessation has been recommended. She feels like is totally ready to quit   Current medicines are reviewed at length with the patient today.  The patient does not have concerns regarding medicines.  The following changes have been made:  Add clonidine 0.1mg  BID.   Labs/ tests ordered today include:   Orders Placed This Encounter  Procedures  . EKG 12-Lead  . Echocardiogram     Disposition:   FU with Dr. Harrington Challenger in 4 months.   Judy Pimple, PA-C  04/26/2015 11:45 AM    [facility] [address], [place], [place]  [address] Phone: [phone]; Fax: [phone]

## 2015-04-26 ENCOUNTER — Encounter: Payer: Self-pay | Admitting: Physician Assistant

## 2015-04-26 ENCOUNTER — Ambulatory Visit (INDEPENDENT_AMBULATORY_CARE_PROVIDER_SITE_OTHER): Payer: Medicare Other | Admitting: Physician Assistant

## 2015-04-26 VITALS — BP 170/84 | HR 77 | Wt 137.2 lb

## 2015-04-26 DIAGNOSIS — I050 Rheumatic mitral stenosis: Secondary | ICD-10-CM

## 2015-04-26 DIAGNOSIS — I10 Essential (primary) hypertension: Secondary | ICD-10-CM | POA: Diagnosis not present

## 2015-04-26 MED ORDER — CLONIDINE HCL 0.1 MG PO TABS: 0.1000 mg | ORAL_TABLET | Freq: Two times a day (BID) | ORAL | Status: DC

## 2015-04-26 NOTE — Patient Instructions (Addendum)
Medication Instructions:  Your physician has recommended you make the following change in your medication:  1.  START the Clonidine 0.1 mg taking 1 tablet daily    Labwork: None ordered  Testing/Procedures: Your physician has requested that you have an echocardiogram. Echocardiography is a painless test that uses sound waves to create images of your heart. It provides your doctor with information about the size and shape of your heart and how well your heart's chambers and valves are working. This procedure takes approximately one hour. There are no restrictions for this procedure.    Follow-Up: Your physician recommends that you schedule a follow-up appointment in:   4-6 WITH DR. Harrington Challenger.    Any Other Special Instructions Will Be Listed Below (If Applicable).  Echocardiogram An echocardiogram, or echocardiography, uses sound waves (ultrasound) to produce an image of your heart. The echocardiogram is simple, painless, obtained within a short period of time, and offers valuable information to your health care provider. The images from an echocardiogram can provide information such as:  Evidence of coronary artery disease (CAD).  Heart size.  Heart muscle function.  Heart valve function.  Aneurysm detection.  Evidence of a past heart attack.  Fluid buildup around the heart.  Heart muscle thickening.  Assess heart valve function. LET [facility] CARE PROVIDER KNOW ABOUT:  Any allergies you have.  All medicines you are taking, including vitamins, herbs, eye drops, creams, and over-the-counter medicines.  Previous problems you or members of your family have had with the use of anesthetics.  Any blood disorders you have.  Previous surgeries you have had.  Medical conditions you have.  Possibility of pregnancy, if this applies. BEFORE THE PROCEDURE  No special preparation is needed. Eat and drink normally.  PROCEDURE   In order to produce an image of your heart, gel  will be applied to your chest and a wand-like tool (transducer) will be moved over your chest. The gel will help transmit the sound waves from the transducer. The sound waves will harmlessly bounce off your heart to allow the heart images to be captured in real-time motion. These images will then be recorded.  You may need an IV to receive a medicine that improves the quality of the pictures. AFTER THE PROCEDURE You may return to your normal schedule including diet, activities, and medicines, unless your health care provider tells you otherwise.   This information is not intended to replace advice given to you by your health care provider. Make sure you discuss any questions you have with your health care provider.   Document Released: 05/04/2000 Document Revised: 05/28/2014 Document Reviewed: 01/12/2013 Elsevier Interactive Patient Education [employer].   If you need a refill on your cardiac medications before your next appointment, please call your pharmacy.

## 2015-05-01 ENCOUNTER — Inpatient Hospital Stay (HOSPITAL_COMMUNITY)
Admission: EM | Admit: 2015-05-01 | Discharge: 2015-05-05 | DRG: 871 | Disposition: A | Discharge status: Home / Self Care | Payer: Medicare Other | Attending: Internal Medicine | Admitting: Internal Medicine

## 2015-05-01 ENCOUNTER — Inpatient Hospital Stay (HOSPITAL_COMMUNITY): Payer: Medicare Other

## 2015-05-01 ENCOUNTER — Emergency Department (HOSPITAL_COMMUNITY): Payer: Medicare Other

## 2015-05-01 ENCOUNTER — Encounter (HOSPITAL_COMMUNITY): Payer: Self-pay | Admitting: Emergency Medicine

## 2015-05-01 DIAGNOSIS — M898X9 Other specified disorders of bone, unspecified site: Secondary | ICD-10-CM | POA: Diagnosis present

## 2015-05-01 DIAGNOSIS — R109 Unspecified abdominal pain: Secondary | ICD-10-CM | POA: Diagnosis present

## 2015-05-01 DIAGNOSIS — E871 Hypo-osmolality and hyponatremia: Secondary | ICD-10-CM | POA: Diagnosis not present

## 2015-05-01 DIAGNOSIS — F17210 Nicotine dependence, cigarettes, uncomplicated: Secondary | ICD-10-CM | POA: Diagnosis present

## 2015-05-01 DIAGNOSIS — Z8619 Personal history of other infectious and parasitic diseases: Secondary | ICD-10-CM | POA: Diagnosis present

## 2015-05-01 DIAGNOSIS — D631 Anemia in chronic kidney disease: Secondary | ICD-10-CM

## 2015-05-01 DIAGNOSIS — D72829 Elevated white blood cell count, unspecified: Secondary | ICD-10-CM | POA: Insufficient documentation

## 2015-05-01 DIAGNOSIS — I050 Rheumatic mitral stenosis: Secondary | ICD-10-CM | POA: Diagnosis present

## 2015-05-01 DIAGNOSIS — K529 Noninfective gastroenteritis and colitis, unspecified: Secondary | ICD-10-CM

## 2015-05-01 DIAGNOSIS — E875 Hyperkalemia: Secondary | ICD-10-CM | POA: Diagnosis not present

## 2015-05-01 DIAGNOSIS — F1290 Cannabis use, unspecified, uncomplicated: Secondary | ICD-10-CM | POA: Diagnosis present

## 2015-05-01 DIAGNOSIS — Z09 Encounter for follow-up examination after completed treatment for conditions other than malignant neoplasm: Secondary | ICD-10-CM

## 2015-05-01 DIAGNOSIS — R0602 Shortness of breath: Secondary | ICD-10-CM | POA: Diagnosis not present

## 2015-05-01 DIAGNOSIS — Z7982 Long term (current) use of aspirin: Secondary | ICD-10-CM

## 2015-05-01 DIAGNOSIS — K219 Gastro-esophageal reflux disease without esophagitis: Secondary | ICD-10-CM | POA: Diagnosis present

## 2015-05-01 DIAGNOSIS — D638 Anemia in other chronic diseases classified elsewhere: Secondary | ICD-10-CM | POA: Diagnosis present

## 2015-05-01 DIAGNOSIS — N2581 Secondary hyperparathyroidism of renal origin: Secondary | ICD-10-CM | POA: Diagnosis present

## 2015-05-01 DIAGNOSIS — K449 Diaphragmatic hernia without obstruction or gangrene: Secondary | ICD-10-CM | POA: Diagnosis present

## 2015-05-01 DIAGNOSIS — I10 Essential (primary) hypertension: Secondary | ICD-10-CM | POA: Diagnosis not present

## 2015-05-01 DIAGNOSIS — J449 Chronic obstructive pulmonary disease, unspecified: Secondary | ICD-10-CM | POA: Diagnosis present

## 2015-05-01 DIAGNOSIS — M329 Systemic lupus erythematosus, unspecified: Secondary | ICD-10-CM | POA: Diagnosis not present

## 2015-05-01 DIAGNOSIS — D649 Anemia, unspecified: Secondary | ICD-10-CM | POA: Diagnosis present

## 2015-05-01 DIAGNOSIS — N186 End stage renal disease: Secondary | ICD-10-CM | POA: Diagnosis not present

## 2015-05-01 DIAGNOSIS — J811 Chronic pulmonary edema: Secondary | ICD-10-CM

## 2015-05-01 DIAGNOSIS — N189 Chronic kidney disease, unspecified: Secondary | ICD-10-CM | POA: Diagnosis not present

## 2015-05-01 DIAGNOSIS — R1031 Right lower quadrant pain: Secondary | ICD-10-CM | POA: Diagnosis not present

## 2015-05-01 DIAGNOSIS — Z992 Dependence on renal dialysis: Secondary | ICD-10-CM

## 2015-05-01 DIAGNOSIS — A419 Sepsis, unspecified organism: Secondary | ICD-10-CM | POA: Diagnosis present

## 2015-05-01 DIAGNOSIS — I5032 Chronic diastolic (congestive) heart failure: Secondary | ICD-10-CM | POA: Diagnosis present

## 2015-05-01 DIAGNOSIS — J9601 Acute respiratory failure with hypoxia: Secondary | ICD-10-CM | POA: Diagnosis present

## 2015-05-01 DIAGNOSIS — I110 Hypertensive heart disease with heart failure: Secondary | ICD-10-CM | POA: Diagnosis present

## 2015-05-01 DIAGNOSIS — Z8673 Personal history of transient ischemic attack (TIA), and cerebral infarction without residual deficits: Secondary | ICD-10-CM

## 2015-05-01 DIAGNOSIS — I120 Hypertensive chronic kidney disease with stage 5 chronic kidney disease or end stage renal disease: Secondary | ICD-10-CM | POA: Diagnosis not present

## 2015-05-01 DIAGNOSIS — R0902 Hypoxemia: Secondary | ICD-10-CM

## 2015-05-01 DIAGNOSIS — I132 Hypertensive heart and chronic kidney disease with heart failure and with stage 5 chronic kidney disease, or end stage renal disease: Secondary | ICD-10-CM | POA: Diagnosis present

## 2015-05-01 DIAGNOSIS — K5792 Diverticulitis of intestine, part unspecified, without perforation or abscess without bleeding: Secondary | ICD-10-CM | POA: Diagnosis present

## 2015-05-01 DIAGNOSIS — Z79899 Other long term (current) drug therapy: Secondary | ICD-10-CM | POA: Diagnosis not present

## 2015-05-01 DIAGNOSIS — R05 Cough: Secondary | ICD-10-CM | POA: Diagnosis not present

## 2015-05-01 LAB — URINALYSIS, ROUTINE W REFLEX MICROSCOPIC
Bilirubin Urine: NEGATIVE
GLUCOSE, UA: 100 mg/dL — AB
Ketones, ur: NEGATIVE mg/dL
LEUKOCYTES UA: NEGATIVE
NITRITE: NEGATIVE
Protein, ur: 100 mg/dL — AB
SPECIFIC GRAVITY, URINE: 1.012 (ref 1.005–1.030)
pH: 8.5 — ABNORMAL HIGH (ref 5.0–8.0)

## 2015-05-01 LAB — COMPREHENSIVE METABOLIC PANEL
ALBUMIN: 3.2 g/dL — AB (ref 3.5–5.0)
ALK PHOS: 84 U/L (ref 38–126)
ALT: 13 U/L — ABNORMAL LOW (ref 14–54)
AST: 17 U/L (ref 15–41)
Anion gap: 14 (ref 5–15)
BILIRUBIN TOTAL: 0.7 mg/dL (ref 0.3–1.2)
BUN: 57 mg/dL — AB (ref 6–20)
CHLORIDE: 100 mmol/L — AB (ref 101–111)
CO2: 22 mmol/L (ref 22–32)
Calcium: 9.8 mg/dL (ref 8.9–10.3)
Creatinine, Ser: 8.56 mg/dL — ABNORMAL HIGH (ref 0.44–1.00)
GFR calc Af Amer: 5 mL/min — ABNORMAL LOW (ref >60)
GFR calc non Af Amer: 5 mL/min — ABNORMAL LOW (ref >60)
Glucose, Bld: 112 mg/dL — ABNORMAL HIGH (ref 65–99)
POTASSIUM: 4.9 mmol/L (ref 3.5–5.1)
Sodium: 136 mmol/L (ref 135–145)
TOTAL PROTEIN: 7.4 g/dL (ref 6.5–8.1)

## 2015-05-01 LAB — CBC
HEMATOCRIT: 33.5 % — AB (ref 36.0–46.0)
HEMOGLOBIN: 10.3 g/dL — AB (ref 12.0–15.0)
MCH: 28.3 pg (ref 26.0–34.0)
MCHC: 30.7 g/dL (ref 30.0–36.0)
MCV: 92 fL (ref 78.0–100.0)
Platelets: 209 10*3/uL (ref 150–400)
RBC: 3.64 MIL/uL — ABNORMAL LOW (ref 3.87–5.11)
RDW: 18.4 % — AB (ref 11.5–15.5)
WBC: 23.4 10*3/uL — AB (ref 4.0–10.5)

## 2015-05-01 LAB — LIPASE, BLOOD: Lipase: 24 U/L (ref 11–51)

## 2015-05-01 LAB — I-STAT CG4 LACTIC ACID, ED
LACTIC ACID, VENOUS: 0.77 mmol/L (ref 0.5–2.0)
Lactic Acid, Venous: 1.46 mmol/L (ref 0.5–2.0)

## 2015-05-01 LAB — URINE MICROSCOPIC-ADD ON: WBC UA: NONE SEEN WBC/hpf (ref 0–5)

## 2015-05-01 MED ORDER — HYDROMORPHONE HCL 1 MG/ML IJ SOLN
1.0000 mg | Freq: Once | INTRAMUSCULAR | Status: AC
  Administered 2015-05-01: 1 mg via INTRAVENOUS
  Filled 2015-05-01: qty 1

## 2015-05-01 MED ORDER — HYDROCODONE-ACETAMINOPHEN 5-325 MG PO TABS
1.0000 | ORAL_TABLET | ORAL | Status: DC | PRN
  Administered 2015-05-02: 1 via ORAL
  Filled 2015-05-01: qty 1

## 2015-05-01 MED ORDER — HYDROMORPHONE HCL 1 MG/ML IJ SOLN
1.0000 mg | Freq: Once | INTRAMUSCULAR | Status: AC
  Administered 2015-05-01: 0.5 mg via INTRAVENOUS
  Filled 2015-05-01: qty 1

## 2015-05-01 MED ORDER — HYDRALAZINE HCL 20 MG/ML IJ SOLN
10.0000 mg | INTRAMUSCULAR | Status: DC | PRN
  Administered 2015-05-02 – 2015-05-04 (×2): 10 mg via INTRAVENOUS
  Filled 2015-05-01 (×3): qty 1

## 2015-05-01 MED ORDER — METRONIDAZOLE IN NACL 5-0.79 MG/ML-% IV SOLN
500.0000 mg | Freq: Once | INTRAVENOUS | Status: AC
  Administered 2015-05-01: 500 mg via INTRAVENOUS

## 2015-05-01 MED ORDER — BARIUM SULFATE 2.1 % PO SUSP
ORAL | Status: AC
  Administered 2015-05-01: 450 mL
  Filled 2015-05-01: qty 2

## 2015-05-01 MED ORDER — CIPROFLOXACIN IN D5W 400 MG/200ML IV SOLN
400.0000 mg | INTRAVENOUS | Status: DC
  Administered 2015-05-02 – 2015-05-03 (×2): 400 mg via INTRAVENOUS
  Filled 2015-05-01 (×3): qty 200

## 2015-05-01 MED ORDER — ASPIRIN 81 MG PO CHEW
81.0000 mg | CHEWABLE_TABLET | Freq: Every day | ORAL | Status: DC
  Administered 2015-05-02 – 2015-05-05 (×4): 81 mg via ORAL
  Filled 2015-05-01 (×4): qty 1

## 2015-05-01 MED ORDER — AMLODIPINE BESYLATE 10 MG PO TABS
10.0000 mg | ORAL_TABLET | Freq: Every day | ORAL | Status: DC
  Administered 2015-05-02: 10 mg via ORAL
  Filled 2015-05-01: qty 1

## 2015-05-01 MED ORDER — ACETAMINOPHEN 650 MG RE SUPP: 650.0000 mg | Freq: Four times a day (QID) | RECTAL | Status: DC | PRN

## 2015-05-01 MED ORDER — CIPROFLOXACIN IN D5W 400 MG/200ML IV SOLN
400.0000 mg | Freq: Once | INTRAVENOUS | Status: AC
  Administered 2015-05-01: 400 mg via INTRAVENOUS
  Filled 2015-05-01: qty 200

## 2015-05-01 MED ORDER — ACETAMINOPHEN 325 MG PO TABS
650.0000 mg | ORAL_TABLET | Freq: Four times a day (QID) | ORAL | Status: DC | PRN
  Administered 2015-05-04 – 2015-05-05 (×2): 650 mg via ORAL
  Filled 2015-05-01 (×2): qty 2

## 2015-05-01 MED ORDER — METRONIDAZOLE IN NACL 5-0.79 MG/ML-% IV SOLN
500.0000 mg | Freq: Three times a day (TID) | INTRAVENOUS | Status: DC
  Administered 2015-05-02 – 2015-05-04 (×6): 500 mg via INTRAVENOUS
  Filled 2015-05-01 (×7): qty 100

## 2015-05-01 MED ORDER — HEPARIN SODIUM (PORCINE) 5000 UNIT/ML IJ SOLN
5000.0000 [IU] | Freq: Three times a day (TID) | INTRAMUSCULAR | Status: DC
  Administered 2015-05-02 – 2015-05-05 (×8): 5000 [IU] via SUBCUTANEOUS
  Filled 2015-05-01 (×7): qty 1

## 2015-05-01 MED ORDER — LANTHANUM CARBONATE 500 MG PO CHEW
1000.0000 mg | CHEWABLE_TABLET | ORAL | Status: DC | PRN
  Filled 2015-05-01: qty 2

## 2015-05-01 MED ORDER — ONDANSETRON HCL 4 MG PO TABS
4.0000 mg | ORAL_TABLET | Freq: Four times a day (QID) | ORAL | Status: DC | PRN
  Administered 2015-05-05: 4 mg via ORAL
  Filled 2015-05-01: qty 1

## 2015-05-01 MED ORDER — IPRATROPIUM-ALBUTEROL 0.5-2.5 (3) MG/3ML IN SOLN: 3.0000 mL | RESPIRATORY_TRACT | Status: DC | PRN

## 2015-05-01 MED ORDER — IPRATROPIUM-ALBUTEROL 0.5-2.5 (3) MG/3ML IN SOLN
3.0000 mL | Freq: Once | RESPIRATORY_TRACT | Status: AC
  Administered 2015-05-01: 3 mL via RESPIRATORY_TRACT
  Filled 2015-05-01: qty 3

## 2015-05-01 MED ORDER — SODIUM CHLORIDE 0.9 % IJ SOLN
3.0000 mL | Freq: Two times a day (BID) | INTRAMUSCULAR | Status: DC
  Administered 2015-05-02 – 2015-05-05 (×6): 3 mL via INTRAVENOUS

## 2015-05-01 MED ORDER — LANTHANUM CARBONATE 500 MG PO CHEW
2000.0000 mg | CHEWABLE_TABLET | Freq: Three times a day (TID) | ORAL | Status: DC
  Administered 2015-05-03 – 2015-05-05 (×7): 2000 mg via ORAL
  Filled 2015-05-01 (×7): qty 4

## 2015-05-01 MED ORDER — CLONIDINE HCL 0.1 MG PO TABS
0.1000 mg | ORAL_TABLET | Freq: Two times a day (BID) | ORAL | Status: DC
  Administered 2015-05-02 – 2015-05-05 (×7): 0.1 mg via ORAL
  Filled 2015-05-01 (×7): qty 1

## 2015-05-01 MED ORDER — PANTOPRAZOLE SODIUM 40 MG PO TBEC
40.0000 mg | DELAYED_RELEASE_TABLET | Freq: Two times a day (BID) | ORAL | Status: DC
  Administered 2015-05-02 – 2015-05-05 (×7): 40 mg via ORAL
  Filled 2015-05-01 (×7): qty 1

## 2015-05-01 MED ORDER — LISINOPRIL 40 MG PO TABS
40.0000 mg | ORAL_TABLET | Freq: Every day | ORAL | Status: DC
  Administered 2015-05-02: 40 mg via ORAL
  Filled 2015-05-01: qty 1

## 2015-05-01 MED ORDER — ONDANSETRON HCL 4 MG/2ML IJ SOLN
4.0000 mg | Freq: Once | INTRAMUSCULAR | Status: AC
  Administered 2015-05-01: 4 mg via INTRAVENOUS
  Filled 2015-05-01: qty 2

## 2015-05-01 MED ORDER — RENA-VITE PO TABS
1.0000 | ORAL_TABLET | Freq: Every day | ORAL | Status: DC
  Administered 2015-05-02 – 2015-05-04 (×3): 1 via ORAL
  Filled 2015-05-01 (×3): qty 1

## 2015-05-01 MED ORDER — ONDANSETRON HCL 4 MG/2ML IJ SOLN: 4.0000 mg | Freq: Four times a day (QID) | INTRAMUSCULAR | Status: DC | PRN

## 2015-05-01 MED ORDER — LABETALOL HCL 100 MG PO TABS: 100.0000 mg | ORAL_TABLET | Freq: Three times a day (TID) | ORAL | Status: DC

## 2015-05-01 MED ORDER — SIMVASTATIN 40 MG PO TABS: 40.0000 mg | ORAL_TABLET | Freq: Every evening | ORAL | Status: DC

## 2015-05-01 MED ORDER — LANTHANUM CARBONATE 500 MG PO CHEW: 1000.0000 mg | CHEWABLE_TABLET | Freq: Every day | ORAL | Status: DC

## 2015-05-01 MED ORDER — HYDROMORPHONE HCL 1 MG/ML IJ SOLN
0.5000 mg | INTRAMUSCULAR | Status: DC | PRN
  Administered 2015-05-02 (×2): 0.5 mg via INTRAVENOUS
  Filled 2015-05-01 (×3): qty 1

## 2015-05-01 NOTE — ED Notes (Signed)
CT notified that the pt has finished her contrast.  

## 2015-05-01 NOTE — H&P (Addendum)
[facility] History and Physical  [name] [id number] DOB: 1961/02/22 DOA: 05/01/2015  Referring physician: ED PCP: Benito Mccreedy, MD   Chief Complaint: abdomen pain  HPI: Patient is a 54 year old female with a past medical history significant for ESRD on HD M/W/F, HTN, AOCD, lupus, mitral stenosis, CVA w/o residual deficits; who presents with complaints of severe abdominal pain . Symptoms started last night , and she describes acute onset of sharp pain on the right and left sides of her abdomen. She initially question  thepizza that she had eaten earlier that night and had tried totake some Pepto-Bismol without relief of symptoms. Pain was constant and localized more to her right side throughout the night. Social symptoms including subjective fever, nausea, vomiting, and loose stool. She still has her appendix. Normally has dialysis on Mondays. Denies recent travel outside the [place] or any sick contacts but notes that her daughter works here at the hospital and possibly brought something home  to her. Patient notes that she is not on oxygen at home, but did suffer from the flu sometime in March of this year and had to be placed on oxygen for short period in time. Currently, she still smokes 1-2 cigarettes per day.   Upon admission into the hospital patient was found to have O2 sats in the 87% on room air placed to nasal cannula oxygen with improvement of O2 sats greater than 90%. Lab work shows elevated white blood cell count of 23.4 K/uL.  CT scan of abdomen shows inflammatory changes in the ascending colon and pericecal soft tissues.  Review of Systems  Constitutional: Positive for fever and malaise/fatigue.  HENT: Negative for congestion and hearing loss.   Eyes: Negative for photophobia and pain.  Respiratory: Positive for shortness of breath and wheezing. Negative for hemoptysis.   Cardiovascular: Negative for orthopnea and claudication.  Gastrointestinal: Positive for nausea,  vomiting and abdominal pain. Negative for diarrhea and constipation.  Genitourinary: Positive for flank pain. Negative for frequency and hematuria.  Skin: Negative for itching and rash.  Neurological: Positive for weakness. Negative for tremors and focal weakness.  Endo/Heme/Allergies: Negative for environmental allergies and polydipsia. Does not bruise/bleed easily.  Psychiatric/Behavioral: Negative for suicidal ideas and memory loss.       Past Medical History  Diagnosis Date  . Hypertension   . Blood transfusion '08    [facility]  . GERD (gastroesophageal reflux disease)   . ESRD (end stage renal disease) ([place])     dialysis   Mon Wed Fri  . Lupus ([place])   . Dysfunctional uterine bleeding 12/19/2010  . Anemia   . Peptic ulcer disease   . Hx of hiatal hernia   . Mitral stenosis     Echo 4/16:  EF 55-60%, no RWMA, Gr 1 DD, mod MS (mean 9 mmHg), mod LAE, mild RAE, PASP 65, mod to severe TR, trivial eff  . Chronic diastolic CHF (congestive heart failure) ([place])   . Hx of cardiovascular stress test     Lexiscan Myoview 4/16:  Normal stress nuclear study, EF 59%     Past Surgical History  Procedure Laterality Date  . Dialysis fistula creation  2007  . Esophagogastroduodenoscopy N/A 07/31/2014    Procedure: ESOPHAGOGASTRODUODENOSCOPY (EGD);  Surgeon: Clarene Essex, MD;  Location: [facility] ENDOSCOPY;  Service: Endoscopy;  Laterality: N/A;      Social History:  reports that she has been smoking Cigarettes.  She has been smoking about 0.25 packs per day. She does not have any smokeless  tobacco history on file. She reports that she drinks alcohol. She reports that she uses illicit drugs (Marijuana). Where does patient live--home  Can patient participate in ADLs?Yes  No Known Allergies  Family History  Problem Relation Age of Onset  . Liver cancer Maternal Grandmother   . Lymphoma Maternal Aunt   . Heart attack Neg Hx        Prior to Admission medications   Medication Sig Start Date End  Date Taking? Authorizing Provider  amLODipine (NORVASC) 10 MG tablet Take 10 mg by mouth daily.     Yes Historical Provider, MD  aspirin 81 MG chewable tablet Chew 1 tablet (81 mg total) by mouth daily. 08/01/14  Yes Domenic Polite, MD  b complex-vitamin c-folic acid (NEPHRO-VITE) 0.8 MG TABS tablet Take 0.8 mg by mouth at bedtime.   Yes Historical Provider, MD  cloNIDine (CATAPRES) 0.1 MG tablet Take 1 tablet (0.1 mg total) by mouth 2 (two) times daily. 04/26/15  Yes Eileen Stanford, PA-C  FOSRENOL 1000 MG chewable tablet Chew 1,000-2,000 mg by mouth 5 (five) times daily. Takes 2 tabs with meals and 1 tab with snacks 03/10/15  Yes Historical Provider, MD  labetalol (NORMODYNE) 100 MG tablet Take 100 mg by mouth 3 (three) times daily.   Yes Historical Provider, MD  lisinopril (PRINIVIL,ZESTRIL) 40 MG tablet Take 40 mg by mouth daily.  08/25/14  Yes Historical Provider, MD  pantoprazole (PROTONIX) 40 MG tablet Take 1 tablet (40 mg total) by mouth 2 (two) times daily. 08/01/14  Yes Domenic Polite, MD  simvastatin (ZOCOR) 40 MG tablet Take 40 mg by mouth every evening.   Yes Historical Provider, MD     Physical Exam: Filed Vitals:   05/01/15 2003 05/01/15 2015 05/01/15 2045 05/01/15 2054  BP: 194/104   193/107  Pulse: 104 99 104 102  Temp:      TempSrc:      Resp:    20  Height:      Weight:      SpO2: 94% 93% 94% 92%     Constitutional: Vital signs reviewed. Patient is a chronically ill appearing, but nontoxic. Alert and oriented x3.  Head: Normocephalic and atraumatic  Ear: TM normal bilaterally  Mouth: no erythema or exudates, MMM  Eyes: PERRL, EOMI, conjunctivae normal, No scleral icterus.  Neck: Supple, Trachea midline normal ROM, No JVD, mass, thyromegaly, or carotid bruit present.  Cardiovascular: RRR, S1 normal, S2 normal, no MRG, pulses symmetric and intact bilaterally  Pulmonary/Chest: Decreased overall air movement mild expiratory wheezes question of fine crackles appreciated  bilaterally. Abdominal: Soft. Tenderness to palpation along the right quadrants, non-distended, bowel sounds are normal, no masses, organomegaly, or guarding present.  GU: no CVA tenderness Musculoskeletal: No joint deformities, erythema, or stiffness, ROM full and no nontender Ext: no edema and no cyanosis, pulses palpable bilaterally (DP and PT)  Hematology: no cervical, inginal, or axillary adenopathy.  Neurological: A&O x3, Strenght is normal and symmetric bilaterally, cranial nerve II-XII are grossly intact, no focal motor deficit, sensory intact to light touch bilaterally.  Skin: Warm, dry and intact. No rash, cyanosis, or clubbing.  Psychiatric: Normal mood and affect. speech and behavior is normal. Judgment and thought content normal. Cognition and memory are normal.      Data Review   Micro Results No results found for this or any previous visit (from the past 240 hour(s)).  Radiology Reports Ct Abdomen Pelvis Wo Contrast  05/01/2015  CLINICAL DATA:  Right lower quadrant  abdominal pain. Vomiting black bile. Dialysis. EXAM: CT ABDOMEN AND PELVIS WITHOUT CONTRAST TECHNIQUE: Multidetector CT imaging of the abdomen and pelvis was performed following the standard protocol without IV contrast. COMPARISON:  None. FINDINGS: Lower chest: Right greater than left basilar airspace densities are noted. The heart is enlarged. Mitral annular calcifications are present. The ventricular chambers are hypodense suggesting anemia. There is no edema or effusion to suggest failure. Hepatobiliary: No focal hepatic lesions are present. The gallbladder and common bile duct are within normal limits. Pancreas: Unremarkable. Spleen: Diffuse granulomatous changes are present. Adrenals/Urinary Tract: The adrenal glands are normal bilaterally. Cystic changes of dialysis are present bilaterally. Ureters are unremarkable. Urinary bladder is within normal limits. Stomach/Bowel: The stomach A and duodenum are within  normal limits. Oral contrast is present throughout the small bowel and extends into the colon as far as the rectum. There is marked irregular wall thickening within the ascending colon and cecum. There is free fluid adjacent to the cecum. The appendix is discretely visualized, is of normal caliber, and is filled with contrast. Is best seen on image 60 of series 201. Vascular/Lymphatic: Extensive atherosclerotic calcifications are present in the aorta and branch vessels. There is no aneurysm. Reproductive: The uterus and adnexa are within normal limits. Other: Musculoskeletal: The bone windows are unremarkable. IMPRESSION: 1. Diffuse inflammatory changes about the cecum, ascending colon, and pericecal soft tissues. The appendix appears to be within normal limits, suggesting this is an acute process of the ascending colon and cecum. Diverticula are present in the ascending colon in this may represent diverticulitis. Infectious colitis is also considered. 2. Chronic renal cystic disease of dialysis. 3. Atherosclerosis. 4. Patchy bibasilar airspace disease. While this may represent atelectasis, early infection or aspiration is not excluded. These results were called by telephone at the time of interpretation on 05/01/2015 at 8:33 pm to Dr. Montine Circle , who verbally acknowledged these results. Electronically Signed   By: San Morelle M.D.   On: 05/01/2015 20:32     CBC  Recent Labs Lab 05/01/15 1626  WBC 23.4*  HGB 10.3*  HCT 33.5*  PLT 209  MCV 92.0  MCH 28.3  MCHC 30.7  RDW 18.4*    Chemistries   Recent Labs Lab 05/01/15 1626  NA 136  K 4.9  CL 100*  CO2 22  GLUCOSE 112*  BUN 57*  CREATININE 8.56*  CALCIUM 9.8  AST 17  ALT 13*  ALKPHOS 84  BILITOT 0.7   ------------------------------------------------------------------------------------------------------------------ estimated creatinine clearance is 7.1 mL/min (by C-G formula based on Cr of  8.56). ------------------------------------------------------------------------------------------------------------------ No results for input(s): HGBA1C in the last 72 hours. ------------------------------------------------------------------------------------------------------------------ No results for input(s): CHOL, HDL, LDLCALC, TRIG, CHOLHDL, LDLDIRECT in the last 72 hours. ------------------------------------------------------------------------------------------------------------------ No results for input(s): TSH, T4TOTAL, T3FREE, THYROIDAB in the last 72 hours.  Invalid input(s): FREET3 ------------------------------------------------------------------------------------------------------------------ No results for input(s): VITAMINB12, FOLATE, FERRITIN, TIBC, IRON, RETICCTPCT in the last 72 hours.  Coagulation profile No results for input(s): INR, PROTIME in the last 168 hours.  No results for input(s): DDIMER in the last 72 hours.  Cardiac Enzymes No results for input(s): CKMB, TROPONINI, MYOGLOBIN in the last 168 hours.  Invalid input(s): CK ------------------------------------------------------------------------------------------------------------------ Invalid input(s): POCBNP   CBG: No results for input(s): GLUCAP in the last 168 hours.     EKG: Independently reviewed. From 04/26/2015 showing normal sinus rhythm with biatrial enlargement and signs of LVH. QTC 477   Assessment/Plan Suspected  sepsis secondary to colitis: Acute. Symptoms starting yesterday night on  the R>L abdomen. Tenderness to palpation on the right flank on physical exam. WBC 23.4K/uL, O2 sats 87% on room air, heart rate initially 110, lactate 1.46. CT of abdomen showing an inflammatory process suggestive of a infectious colitis versus diverticulitis. - Admit to telemetry bed -  F/u Bld cx x 2 - Abx Ciprofloxacin and Flagyl - cbc in am   Shortness of breath with hypoxia: Patient found to have  O2 sats of 87% on room air requiring 2 L nasal cannula improved stats to 92%. Question patient is partially fluid overloaded due to last dialysis being on this past Friday. - Changed to continuous pulse oximetry - Nasal cannula oxygen to keep O2 sats greater than 92% - Follow-up chest x-ray Addendum: reviewed chest x-ray which shows pulmonary vascular congestion -40 mg of IV Lasix stat  End-stage renal disease on hemodialysis: Patient usually dialysis M/W/F. Creatinine 8.56 BUN 57 - Strict ins and outs - IV fluids at minimal to decrease risk of fluid overload - Consult nephrology in a.m. for need of dialysis  Hypertension: Uncontrolled. Blood pressure as high as 193/107. - Continue home medications of amlodipine, clonidine, lisinopril, labetalol  - Hydralazine prn  sBP >180 or dBP >110  Anemia: Chronic. Patient with a past medical history of dysfunctional uterine bleeding as well. Hemoglobin 10.3 g/dL on admission and appears close to the patient's baseline of 9-11 g/dL. - Follow-up CBC    Lupus (systemic lupus erythematosus): Stable   Mitral stenosis: Last echocardiogram on file from 07/26/2014 shows EF of 55-60% with moderate mitral valve stenosis. Valve area of 1.97 cm. Patient appears to have been scheduled for a repeat echocardiogram as of 04/26/2015. - Question of having echo done inpatient versus outpatient   Tobacco use: patient smokes 2-3 cigarettes per day - Counseled on need to complete smoking cessation as she plans on quitting at the beginning of the new year  GERD - Protonix    Heparin DVT prophylaxis  Code Status:   full Family Communication: bedside Disposition Plan: admit   Total time spent 55 minutes.Greater than 50% of this time was spent in counseling, explanation of diagnosis, planning of further management, and coordination of care  [facility] Pager [phone]  If 7PM-7AM, please contact night-coverage [email or web address] Password  [email or web address] 05/01/2015, 9:12 PM

## 2015-05-01 NOTE — ED Notes (Signed)
Pt c/o severe abdominal pain onset last night. Pt denies N/V.

## 2015-05-01 NOTE — Progress Notes (Signed)
ANTIBIOTIC CONSULT NOTE - INITIAL  Pharmacy Consult for Cipro Indication: Intra-abdominal infection  No Known Allergies  Patient Measurements: Height: 5\' 6"  (167.6 cm) Weight: 137 lb (62.143 kg) IBW/kg (Calculated) : 59.3  Vital Signs: Temp: 98.6 F (37 C) (12/11 2222) Temp Source: Oral (12/11 2222) BP: 193/101 mmHg (12/11 2222) Pulse Rate: 98 (12/11 2222)  Labs:  Recent Labs  05/01/15 1626  WBC 23.4*  HGB 10.3*  PLT 209  CREATININE 8.56*   Estimated Creatinine Clearance: 7.1 mL/min (by C-G formula based on Cr of 8.56).  Medical History: Past Medical History  Diagnosis Date  . Hypertension   . Blood transfusion '08    [facility]  . GERD (gastroesophageal reflux disease)   . ESRD (end stage renal disease) ([place])     dialysis   Mon Wed Fri  . Lupus ([place])   . Dysfunctional uterine bleeding 12/19/2010  . Anemia   . Peptic ulcer disease   . Hx of hiatal hernia   . Mitral stenosis     Echo 4/16:  EF 55-60%, no RWMA, Gr 1 DD, mod MS (mean 9 mmHg), mod LAE, mild RAE, PASP 65, mod to severe TR, trivial eff  . Chronic diastolic CHF (congestive heart failure) ([place])   . Hx of cardiovascular stress test     Lexiscan Myoview 4/16:  Normal stress nuclear study, EF 59%   Assessment: 54 y/o F to start Cipro for possible intra-abdominal infection, WBC is elevated, ESRD on HD  Plan:  -Cipro 400 mg IV q24h -Trend WBC, temp, HD schedule   Narda Bonds 05/01/2015,10:53 PM

## 2015-05-01 NOTE — ED Notes (Signed)
Family at bedside. 

## 2015-05-01 NOTE — ED Notes (Signed)
Pt desatted to 87% while this RN was in the room. This RN placed the pt on 3L nasal cannula, and the pt's sats increased to 92%.

## 2015-05-01 NOTE — ED Notes (Signed)
Pt is aware she needs a urine sample. Bed side potty chair placed in room.

## 2015-05-01 NOTE — ED Provider Notes (Signed)
CSN: [id number]     Arrival date & time 05/01/15  1617 History   First MD Initiated Contact with Patient 05/01/15 1628     Chief Complaint  Patient presents with  . Abdominal Pain     (Consider location/radiation/quality/duration/timing/severity/associated sxs/prior Treatment) HPI Comments: Patient with PMH remarkable for ESRD, last HD Friday, Lupus, HTN, presents to the ED with a chief complaint of RLQ abdominal pain.  States that the pain started suddenly last night.  She states that she has some pain in both her flanks.  She states that she makes a small amount of urine at baseline and reports no changes or urinary symptoms.  She denies any associated nausea, vomiting, or diarrhea.  The pain is worsened with palpation.  She has not tried taking anything to alleviate her symptoms.  She denies any other symptoms.  The history is provided by the patient. No language interpreter was used.    Past Medical History  Diagnosis Date  . Hypertension   . Blood transfusion '08    [facility]  . GERD (gastroesophageal reflux disease)   . ESRD (end stage renal disease) ([place])     dialysis   Mon Wed Fri  . Lupus ([place])   . Dysfunctional uterine bleeding 12/19/2010  . Anemia   . Peptic ulcer disease   . Hx of hiatal hernia   . Mitral stenosis     Echo 4/16:  EF 55-60%, no RWMA, Gr 1 DD, mod MS (mean 9 mmHg), mod LAE, mild RAE, PASP 65, mod to severe TR, trivial eff  . Chronic diastolic CHF (congestive heart failure) ([place])   . Hx of cardiovascular stress test     Lexiscan Myoview 4/16:  Normal stress nuclear study, EF 59%   Past Surgical History  Procedure Laterality Date  . Dialysis fistula creation  2007  . Esophagogastroduodenoscopy N/A 07/31/2014    Procedure: ESOPHAGOGASTRODUODENOSCOPY (EGD);  Surgeon: Clarene Essex, MD;  Location: [facility] ENDOSCOPY;  Service: Endoscopy;  Laterality: N/A;   Family History  Problem Relation Age of Onset  . Liver cancer Maternal Grandmother   . Lymphoma Maternal Aunt    . Heart attack Neg Hx    Social History  Substance Use Topics  . Smoking status: Current Some Day Smoker -- 0.25 packs/day    Types: Cigarettes  . Smokeless tobacco: None  . Alcohol Use: 0.0 oz/week    0 Standard drinks or equivalent per week     Comment: occasional   OB History    No data available     Review of Systems  Constitutional: Negative for fever and chills.  Respiratory: Negative for shortness of breath.   Cardiovascular: Negative for chest pain.  Gastrointestinal: Positive for abdominal pain. Negative for nausea, vomiting, diarrhea and constipation.  Genitourinary: Positive for flank pain. Negative for dysuria.  All other systems reviewed and are negative.     Allergies  Review of patient's allergies indicates no known allergies.  Home Medications   Prior to Admission medications   Medication Sig Start Date End Date Taking? Authorizing Provider  amLODipine (NORVASC) 10 MG tablet Take 10 mg by mouth daily.      Historical Provider, MD  aspirin 81 MG chewable tablet Chew 1 tablet (81 mg total) by mouth daily. 08/01/14   Domenic Polite, MD  b complex-vitamin c-folic acid (NEPHRO-VITE) 0.8 MG TABS tablet Take 0.8 mg by mouth at bedtime.    Historical Provider, MD  calcium acetate (PHOSLO) 667 MG capsule Take 667 mg by  mouth 2 (two) times daily.      Historical Provider, MD  cloNIDine (CATAPRES) 0.1 MG tablet Take 1 tablet (0.1 mg total) by mouth 2 (two) times daily. 04/26/15   Eileen Stanford, PA-C  labetalol (NORMODYNE) 100 MG tablet Take 100 mg by mouth 3 (three) times daily.    Historical Provider, MD  lisinopril (PRINIVIL,ZESTRIL) 40 MG tablet Take 40 mg by mouth daily.  08/25/14   Historical Provider, MD  pantoprazole (PROTONIX) 40 MG tablet Take 1 tablet (40 mg total) by mouth 2 (two) times daily. 08/01/14   Domenic Polite, MD  promethazine (PHENERGAN) 25 MG tablet Take 25 mg by mouth every 6 (six) hours as needed for nausea.    Historical Provider, MD   simvastatin (ZOCOR) 40 MG tablet Take 40 mg by mouth every evening.    Historical Provider, MD   BP 193/101 mmHg  Pulse 100  Temp(Src) 98 F (36.7 C) (Oral)  Resp 16  Ht 5\' 6"  (1.676 m)  Wt 62.143 kg  BMI 22.12 kg/m2  SpO2 98%  LMP 12/02/2010 Physical Exam  Constitutional: She is oriented to person, place, and time. She appears well-developed and well-nourished.  HENT:  Head: Normocephalic and atraumatic.  Eyes: Conjunctivae and EOM are normal. Pupils are equal, round, and reactive to light.  Neck: Normal range of motion. Neck supple.  Cardiovascular: Normal rate and regular rhythm.  Exam reveals no gallop and no friction rub.   No murmur heard. Pulmonary/Chest: Effort normal and breath sounds normal. No respiratory distress. She has no wheezes. She has no rales. She exhibits no tenderness.  Abdominal: Soft. Bowel sounds are normal. She exhibits no distension and no mass. There is no tenderness. There is no rebound and no guarding.  RLQ tender to palpation, no other focal abdominal tenderness  Musculoskeletal: Normal range of motion. She exhibits no edema or tenderness.  Neurological: She is alert and oriented to person, place, and time.  Skin: Skin is warm and dry.  Psychiatric: She has a normal mood and affect. Her behavior is normal. Judgment and thought content normal.  Nursing note and vitals reviewed.   ED Course  Procedures (including critical care time)  Results for orders placed or performed during the hospital encounter of 05/01/15  Lipase, blood  Result Value Ref Range   Lipase 24 11 - 51 U/L  Comprehensive metabolic panel  Result Value Ref Range   Sodium 136 135 - 145 mmol/L   Potassium 4.9 3.5 - 5.1 mmol/L   Chloride 100 (L) 101 - 111 mmol/L   CO2 22 22 - 32 mmol/L   Glucose, Bld 112 (H) 65 - 99 mg/dL   BUN 57 (H) 6 - 20 mg/dL   Creatinine, Ser 8.56 (H) 0.44 - 1.00 mg/dL   Calcium 9.8 8.9 - 10.3 mg/dL   Total Protein 7.4 6.5 - 8.1 g/dL   Albumin 3.2 (L)  3.5 - 5.0 g/dL   AST 17 15 - 41 U/L   ALT 13 (L) 14 - 54 U/L   Alkaline Phosphatase 84 38 - 126 U/L   Total Bilirubin 0.7 0.3 - 1.2 mg/dL   GFR calc non Af Amer 5 (L) >60 mL/min   GFR calc Af Amer 5 (L) >60 mL/min   Anion gap 14 5 - 15  CBC  Result Value Ref Range   WBC 23.4 (H) 4.0 - 10.5 K/uL   RBC 3.64 (L) 3.87 - 5.11 MIL/uL   Hemoglobin 10.3 (L) 12.0 - 15.0 g/dL  HCT 33.5 (L) 36.0 - 46.0 %   MCV 92.0 78.0 - 100.0 fL   MCH 28.3 26.0 - 34.0 pg   MCHC 30.7 30.0 - 36.0 g/dL   RDW 18.4 (H) 11.5 - 15.5 %   Platelets 209 150 - 400 K/uL  I-Stat CG4 Lactic Acid, ED  Result Value Ref Range   Lactic Acid, Venous 1.46 0.5 - 2.0 mmol/L   No results found.    I have personally reviewed and evaluated these images and lab results as part of my medical decision-making.   EKG Interpretation None      MDM   Final diagnoses:  Colitis    Patient with severe RLQ pain.  Onset last night.  No vomiting.  Will check CT and labs.  Leukocytosis of 24.  Lactic acid is normal.  Electrolytes baseline.   CT remarkable for inflammatory changes in the ascending colon.  Potential ascending diverticulitis vs infectious colitis.  Patient seen by and discussed with Dr. Maryan Rued.  At this time feel that it is best that patient be admitted to ensure no worsening symptoms.  Will start cipro and flagyl.    Patient discussed with Dr. Harvest Forest from [facility].  Appreciate him for admitting patient.  Informed that patient O2 desatted when O2 was removed to 87%.... Patchy bilateral airspace disease seen on CT.  Could be early infection.       Montine Circle, PA-C 05/01/15 2151  Blanchie Dessert, MD 05/02/15 [phone]

## 2015-05-02 ENCOUNTER — Inpatient Hospital Stay (HOSPITAL_COMMUNITY): Payer: Medicare Other

## 2015-05-02 LAB — RENAL FUNCTION PANEL
Albumin: 3 g/dL — ABNORMAL LOW (ref 3.5–5.0)
Anion gap: 20 — ABNORMAL HIGH (ref 5–15)
BUN: 69 mg/dL — AB (ref 6–20)
CHLORIDE: 100 mmol/L — AB (ref 101–111)
CO2: 16 mmol/L — AB (ref 22–32)
CREATININE: 9.46 mg/dL — AB (ref 0.44–1.00)
Calcium: 9.7 mg/dL (ref 8.9–10.3)
GFR calc Af Amer: 5 mL/min — ABNORMAL LOW (ref >60)
GFR calc non Af Amer: 4 mL/min — ABNORMAL LOW (ref >60)
GLUCOSE: 62 mg/dL — AB (ref 65–99)
POTASSIUM: 5.8 mmol/L — AB (ref 3.5–5.1)
Phosphorus: 5.4 mg/dL — ABNORMAL HIGH (ref 2.5–4.6)
Sodium: 136 mmol/L (ref 135–145)

## 2015-05-02 LAB — POTASSIUM: POTASSIUM: 4 mmol/L (ref 3.5–5.1)

## 2015-05-02 LAB — CBC
HEMATOCRIT: 33.6 % — AB (ref 36.0–46.0)
Hemoglobin: 10.9 g/dL — ABNORMAL LOW (ref 12.0–15.0)
MCH: 29.4 pg (ref 26.0–34.0)
MCHC: 32.4 g/dL (ref 30.0–36.0)
MCV: 90.6 fL (ref 78.0–100.0)
PLATELETS: 220 10*3/uL (ref 150–400)
RBC: 3.71 MIL/uL — ABNORMAL LOW (ref 3.87–5.11)
RDW: 18.6 % — AB (ref 11.5–15.5)
WBC: 22.3 10*3/uL — ABNORMAL HIGH (ref 4.0–10.5)

## 2015-05-02 LAB — PROCALCITONIN: PROCALCITONIN: 9.05 ng/mL

## 2015-05-02 MED ORDER — LIDOCAINE-PRILOCAINE 2.5-2.5 % EX CREA
1.0000 "application " | TOPICAL_CREAM | CUTANEOUS | Status: DC | PRN
  Filled 2015-05-02: qty 5

## 2015-05-02 MED ORDER — LIDOCAINE HCL (PF) 1 % IJ SOLN: 5.0000 mL | INTRAMUSCULAR | Status: DC | PRN

## 2015-05-02 MED ORDER — HEPARIN SODIUM (PORCINE) 1000 UNIT/ML DIALYSIS: 1000.0000 [IU] | INTRAMUSCULAR | Status: DC | PRN

## 2015-05-02 MED ORDER — SODIUM CHLORIDE 0.9 % IV SOLN: 100.0000 mL | INTRAVENOUS | Status: DC | PRN

## 2015-05-02 MED ORDER — LISINOPRIL 40 MG PO TABS: 40.0000 mg | ORAL_TABLET | Freq: Every day | ORAL | Status: DC

## 2015-05-02 MED ORDER — LABETALOL HCL 300 MG PO TABS
600.0000 mg | ORAL_TABLET | Freq: Three times a day (TID) | ORAL | Status: DC
  Administered 2015-05-02 – 2015-05-05 (×10): 600 mg via ORAL
  Filled 2015-05-02 (×10): qty 2

## 2015-05-02 MED ORDER — AMLODIPINE BESYLATE 10 MG PO TABS
10.0000 mg | ORAL_TABLET | Freq: Every day | ORAL | Status: DC
  Administered 2015-05-03 – 2015-05-04 (×2): 10 mg via ORAL
  Filled 2015-05-02 (×2): qty 1

## 2015-05-02 MED ORDER — DARBEPOETIN ALFA 150 MCG/0.3ML IJ SOSY
150.0000 ug | PREFILLED_SYRINGE | INTRAMUSCULAR | Status: DC
  Filled 2015-05-02: qty 0.3

## 2015-05-02 MED ORDER — HYDROMORPHONE HCL 1 MG/ML IJ SOLN
INTRAMUSCULAR | Status: AC
  Filled 2015-05-02: qty 1

## 2015-05-02 MED ORDER — FUROSEMIDE 10 MG/ML IJ SOLN
40.0000 mg | INTRAMUSCULAR | Status: AC
Start: 2015-05-02 — End: 2015-05-02
  Administered 2015-05-02: 40 mg via INTRAVENOUS
  Filled 2015-05-02: qty 4

## 2015-05-02 MED ORDER — ALTEPLASE 2 MG IJ SOLR
2.0000 mg | Freq: Once | INTRAMUSCULAR | Status: DC | PRN
Start: 2015-05-02 — End: 2015-05-02

## 2015-05-02 MED ORDER — HEPARIN SODIUM (PORCINE) 1000 UNIT/ML DIALYSIS: 20.0000 [IU]/kg | INTRAMUSCULAR | Status: DC | PRN

## 2015-05-02 MED ORDER — PENTAFLUOROPROP-TETRAFLUOROETH EX AERO: 1.0000 "application " | INHALATION_SPRAY | CUTANEOUS | Status: DC | PRN

## 2015-05-02 MED ORDER — CALCITRIOL 0.5 MCG PO CAPS
1.5000 ug | ORAL_CAPSULE | ORAL | Status: DC
  Administered 2015-05-02 – 2015-05-04 (×2): 1.5 ug via ORAL
  Filled 2015-05-02 (×2): qty 3

## 2015-05-02 MED ORDER — ATORVASTATIN CALCIUM 20 MG PO TABS
20.0000 mg | ORAL_TABLET | Freq: Every day | ORAL | Status: DC
  Administered 2015-05-02 – 2015-05-04 (×3): 20 mg via ORAL
  Filled 2015-05-02 (×4): qty 1

## 2015-05-02 NOTE — Progress Notes (Signed)
[facility] PROGRESS NOTE  Seychelle Parthemore [id number] DOB: [birth year] DOA: 05/01/2015 PCP: Benito Mccreedy, MD  Assessment/Plan: 1. Colitis/ diverticulitis: - admitted for IV antibiotics.  - improving.  Normal lactic acid and elevated pro calcitonin level.    2. Hyperkalemia: - get repeat K tonight.   3. Acute respiratory failure with hypoxia: - lasix and HD treatments.  =- wean her off the oxygen.    4. ESRD on MWF: Further management as per renal.    Hypertension: Better controlled.   Lupus stable.     Code Status: full code.  Family Communication: none at bedside Disposition Plan: pending.    Consultants:  renal  Procedures:  HD  Antibiotics:  cipro   flagyl  HPI/Subjective: Comfortable.   Objective: Filed Vitals:   05/02/15 1604 05/02/15 1730  BP: 197/106 179/96  Pulse: 107 107  Temp:  99.5 F (37.5 C)  Resp: 23     Intake/Output Summary (Last 24 hours) at 05/02/15 1855 Last data filed at 05/02/15 1604  Gross per 24 hour  Intake    830 ml  Output   1856 ml  Net  -1026 ml   Filed Weights   05/01/15 1621 05/02/15 1234 05/02/15 1604  Weight: 62.143 kg (137 lb) 63.3 kg (139 lb 8.8 oz) 61 kg (134 lb 7.7 oz)    Exam:   General:  Alert and comfortbale  Cardiovascular: s1s2  Respiratory: ctab  Abdomen: soft non tender bs+  Musculoskeletal: pedal edema.   Data Reviewed: Basic Metabolic Panel:  Recent Labs Lab 05/01/15 1626 05/02/15 0748  NA 136 136  K 4.9 5.8*  CL 100* 100*  CO2 22 16*  GLUCOSE 112* 62*  BUN 57* 69*  CREATININE 8.56* 9.46*  CALCIUM 9.8 9.7  PHOS  --  5.4*   Liver Function Tests:  Recent Labs Lab 05/01/15 1626 05/02/15 0748  AST 17  --   ALT 13*  --   ALKPHOS 84  --   BILITOT 0.7  --   PROT 7.4  --   ALBUMIN 3.2* 3.0*    Recent Labs Lab 05/01/15 1626  LIPASE 24   No results for input(s): AMMONIA in the last 168 hours. CBC:  Recent Labs Lab 05/01/15 1626 05/02/15 0748   WBC 23.4* 22.3*  HGB 10.3* 10.9*  HCT 33.5* 33.6*  MCV 92.0 90.6  PLT 209 220   Cardiac Enzymes: No results for input(s): CKTOTAL, CKMB, CKMBINDEX, TROPONINI in the last 168 hours. BNP (last 3 results)  Recent Labs  07/25/14 2140  BNP 2460.9*    ProBNP (last 3 results) No results for input(s): PROBNP in the last 8760 hours.  CBG: No results for input(s): GLUCAP in the last 168 hours.  No results found for this or any previous visit (from the past 240 hour(s)).   Studies: Ct Abdomen Pelvis Wo Contrast  05/01/2015  CLINICAL DATA:  Right lower quadrant abdominal pain. Vomiting black bile. Dialysis. EXAM: CT ABDOMEN AND PELVIS WITHOUT CONTRAST TECHNIQUE: Multidetector CT imaging of the abdomen and pelvis was performed following the standard protocol without IV contrast. COMPARISON:  None. FINDINGS: Lower chest: Right greater than left basilar airspace densities are noted. The heart is enlarged. Mitral annular calcifications are present. The ventricular chambers are hypodense suggesting anemia. There is no edema or effusion to suggest failure. Hepatobiliary: No focal hepatic lesions are present. The gallbladder and common bile duct are within normal limits. Pancreas: Unremarkable. Spleen: Diffuse granulomatous changes are present. Adrenals/Urinary Tract: The adrenal glands are normal  bilaterally. Cystic changes of dialysis are present bilaterally. Ureters are unremarkable. Urinary bladder is within normal limits. Stomach/Bowel: The stomach A and duodenum are within normal limits. Oral contrast is present throughout the small bowel and extends into the colon as far as the rectum. There is marked irregular wall thickening within the ascending colon and cecum. There is free fluid adjacent to the cecum. The appendix is discretely visualized, is of normal caliber, and is filled with contrast. Is best seen on image 60 of series 201. Vascular/Lymphatic: Extensive atherosclerotic calcifications are  present in the aorta and branch vessels. There is no aneurysm. Reproductive: The uterus and adnexa are within normal limits. Other: Musculoskeletal: The bone windows are unremarkable. IMPRESSION: 1. Diffuse inflammatory changes about the cecum, ascending colon, and pericecal soft tissues. The appendix appears to be within normal limits, suggesting this is an acute process of the ascending colon and cecum. Diverticula are present in the ascending colon in this may represent diverticulitis. Infectious colitis is also considered. 2. Chronic renal cystic disease of dialysis. 3. Atherosclerosis. 4. Patchy bibasilar airspace disease. While this may represent atelectasis, early infection or aspiration is not excluded. These results were called by telephone at the time of interpretation on 05/01/2015 at 8:33 pm to Dr. Montine Circle , who verbally acknowledged these results. Electronically Signed   By: San Morelle M.D.   On: 05/01/2015 20:32   Dg Chest Port 1 View  05/01/2015  CLINICAL DATA:  Sepsis.  Shortness of breath. EXAM: PORTABLE CHEST 1 VIEW COMPARISON:  07/26/2014 FINDINGS: Cardiac enlargement with increased pulmonary vascular congestion. Hazy diffuse parenchymal infiltration is increasing since previous study suggesting increasing edema. Improved atelectasis in the lung bases. No blunting of costophrenic angles. No pneumothorax. Mediastinal contours appear intact. IMPRESSION: Cardiac enlargement with vascular congestion and diffuse edema, increasing since previous study. Electronically Signed   By: Lucienne Capers M.D.   On: 05/01/2015 23:19    Scheduled Meds: . [START ON 05/03/2015] amLODipine  10 mg Oral QHS  . aspirin  81 mg Oral Daily  . atorvastatin  20 mg Oral q1800  . calcitRIOL  1.5 mcg Oral Q M,W,F-HD  . ciprofloxacin  400 mg Intravenous Q24H  . cloNIDine  0.1 mg Oral BID  . [START ON 05/04/2015] darbepoetin (ARANESP) injection - DIALYSIS  150 mcg Intravenous Q Wed-HD  . heparin   5,000 Units Subcutaneous 3 times per day  . labetalol  600 mg Oral 3 times per day  . lanthanum  2,000 mg Oral TID WC  . [START ON 05/03/2015] lisinopril  40 mg Oral q1800  . metronidazole  500 mg Intravenous Q8H  . multivitamin  1 tablet Oral QHS  . pantoprazole  40 mg Oral BID  . sodium chloride  3 mL Intravenous Q12H   Continuous Infusions:   Principal Problem:   Colitis Active Problems:   Hypertension   End-stage renal disease on hemodialysis (HCC)   Lupus (systemic lupus erythematosus) (HCC)   Anemia   Mitral stenosis   Sepsis ([place])   Hypoxia    Time spent: 25 minutes.     [facility] Pager [phone] . If 7PM-7AM, please contact night-coverage at [email or web address], password [email or web address] 05/02/2015, 6:55 PM  LOS: 1 day

## 2015-05-02 NOTE — Procedures (Signed)
  I was present at this dialysis session, have reviewed the session itself and made  appropriate changes [name] MD [place] pager [phone]    cell [phone] 05/02/2015, 3:44 PM  '

## 2015-05-02 NOTE — Consult Note (Signed)
[facility] Renal Consultation Note    Indication for Consultation:  Management of ESRD/hemodialysis; anemia, hypertension/volume and secondary hyperparathyroidism PCP: OSEI-BONSU,GEORGE, MD   HPI: [name] is a 54 y.o. female with ESRD on MWF dialysis at [facility] with hx of poorly controlled hypertension, GERD, lupus, MS, CVA who presented early today with several abdominal pain that started Saturday night.  She hasn't had any fevers.  She had vomiting after she arrived to the ED and has since had diarrhea..  WBC is elevated at 23K CT showed inflammatory changes in the ascending colon, cecum and pericecal soft tissue.  CXR showed ^ edema.   She regularly attends dialysis and always runs her full treatments. Pre HD BPs are generally 170 - 180/90 - 100 and posts are 180 - 190/90/1100 with UF averages 2-3 L and she doesn't quite attain her [name] due to cramping..  Cardiology recently started her on 0.1 bid clonidine 12/6 in addition to current BP meds.  At present she feels hot, has fatigue and SOB if up moving around.  Belly doesn't hurt as much as yesterday.  She coughs more when supine.  Belly hurts when she coughs.  Past Medical History  Diagnosis Date  . Hypertension   . Blood transfusion '08    [facility]  . GERD (gastroesophageal reflux disease)   . ESRD (end stage renal disease) ([place])     dialysis   Mon Wed Fri  . Lupus ([place])   . Dysfunctional uterine bleeding 12/19/2010  . Anemia   . Peptic ulcer disease   . Hx of hiatal hernia   . Mitral stenosis     Echo 4/16:  EF 55-60%, no RWMA, Gr 1 DD, mod MS (mean 9 mmHg), mod LAE, mild RAE, PASP 65, mod to severe TR, trivial eff  . Chronic diastolic CHF (congestive heart failure) ([place])   . Hx of cardiovascular stress test     Lexiscan Myoview 4/16:  Normal stress nuclear study, EF 59%   Past Surgical History  Procedure Laterality Date  . Dialysis fistula creation  2007  . Esophagogastroduodenoscopy N/A 07/31/2014    Procedure:  ESOPHAGOGASTRODUODENOSCOPY (EGD);  Surgeon: Clarene Essex, MD;  Location: [facility] ENDOSCOPY;  Service: Endoscopy;  Laterality: N/A;   Family History  Problem Relation Age of Onset  . Liver cancer Maternal Grandmother   . Lymphoma Maternal Aunt   . Heart attack Neg Hx    Social History:  reports that she has been smoking Cigarettes.  She has been smoking about 0.25 packs per day. She does not have any smokeless tobacco history on file. She reports that she drinks alcohol. She reports that she uses illicit drugs (Marijuana). No Known Allergies Prior to Admission medications   Medication Sig Start Date End Date Taking? Authorizing Provider  amLODipine (NORVASC) 10 MG tablet Take 10 mg by mouth daily.     Yes Historical Provider, MD  aspirin 81 MG chewable tablet Chew 1 tablet (81 mg total) by mouth daily. 08/01/14  Yes Domenic Polite, MD  b complex-vitamin c-folic acid (NEPHRO-VITE) 0.8 MG TABS tablet Take 0.8 mg by mouth at bedtime.   Yes Historical Provider, MD  cloNIDine (CATAPRES) 0.1 MG tablet Take 1 tablet (0.1 mg total) by mouth 2 (two) times daily. 04/26/15  Yes Eileen Stanford, PA-C  FOSRENOL 1000 MG chewable tablet Chew 1,000-2,000 mg by mouth 5 (five) times daily. Takes 2 tabs with meals and 1 tab with snacks 03/10/15  Yes Historical Provider, MD  labetalol (NORMODYNE)  100 MG tablet Take 100 mg by mouth 3 (three) times daily.   Yes Historical Provider, MD  lisinopril (PRINIVIL,ZESTRIL) 40 MG tablet Take 40 mg by mouth daily.  08/25/14  Yes Historical Provider, MD  pantoprazole (PROTONIX) 40 MG tablet Take 1 tablet (40 mg total) by mouth 2 (two) times daily. 08/01/14  Yes Domenic Polite, MD  simvastatin (ZOCOR) 40 MG tablet Take 40 mg by mouth every evening.   Yes Historical Provider, MD   Current Facility-Administered Medications  Medication Dose Route Frequency Provider Last Rate Last Dose  . acetaminophen (TYLENOL) tablet 650 mg  650 mg Oral Q6H PRN Norval Morton, MD       Or  .  acetaminophen (TYLENOL) suppository 650 mg  650 mg Rectal Q6H PRN Norval Morton, MD      . amLODipine (NORVASC) tablet 10 mg  10 mg Oral Daily Norval Morton, MD   10 mg at 05/02/15 0944  . aspirin chewable tablet 81 mg  81 mg Oral Daily Norval Morton, MD   81 mg at 05/02/15 0944  . calcitRIOL (ROCALTROL) capsule 1.5 mcg  1.5 mcg Oral Q M,W,F-HD Alric Seton, PA-C      . ciprofloxacin (CIPRO) IVPB 400 mg  400 mg Intravenous Q24H Erenest Blank, RPH      . cloNIDine (CATAPRES) tablet 0.1 mg  0.1 mg Oral BID Norval Morton, MD   0.1 mg at 05/02/15 0944  . [START ON 05/04/2015] Darbepoetin Alfa (ARANESP) injection 150 mcg  150 mcg Intravenous Q Wed-HD Alric Seton, PA-C      . heparin injection 5,000 Units  5,000 Units Subcutaneous 3 times per day Norval Morton, MD      . hydrALAZINE (APRESOLINE) injection 10 mg  10 mg Intravenous Q4H PRN Norval Morton, MD   10 mg at 05/02/15 0242  . HYDROcodone-acetaminophen (NORCO/VICODIN) 5-325 MG per tablet 1 tablet  1 tablet Oral Q4H PRN Norval Morton, MD      . HYDROmorphone (DILAUDID) injection 0.5 mg  0.5 mg Intravenous Q3H PRN Norval Morton, MD      . ipratropium-albuterol (DUONEB) 0.5-2.5 (3) MG/3ML nebulizer solution 3 mL  3 mL Nebulization Q4H PRN Rondell A Tamala Julian, MD      . labetalol (NORMODYNE) tablet 100 mg  100 mg Oral 3 times per day Norval Morton, MD   100 mg at 05/01/15 2330  . lanthanum (FOSRENOL) chewable tablet 2,000 mg  2,000 mg Oral TID WC Rondell A Tamala Julian, MD   2,000 mg at 05/02/15 0800   And  . lanthanum (FOSRENOL) chewable tablet 1,000 mg  1,000 mg Oral PRN Norval Morton, MD      . lisinopril (PRINIVIL,ZESTRIL) tablet 40 mg  40 mg Oral Daily Norval Morton, MD   40 mg at 05/02/15 0944  . metroNIDAZOLE (FLAGYL) IVPB 500 mg  500 mg Intravenous Q8H Rondell A Tamala Julian, MD      . multivitamin (RENA-VIT) tablet 1 tablet  1 tablet Oral QHS Norval Morton, MD   1 tablet at 05/01/15 2330  . ondansetron (ZOFRAN) tablet 4 mg  4 mg  Oral Q6H PRN Norval Morton, MD       Or  . ondansetron (ZOFRAN) injection 4 mg  4 mg Intravenous Q6H PRN Norval Morton, MD      . pantoprazole (PROTONIX) EC tablet 40 mg  40 mg Oral BID Norval Morton, MD   40 mg at 05/02/15  [id number]  . simvastatin (ZOCOR) tablet 40 mg  40 mg Oral QPM Norval Morton, MD   40 mg at 05/01/15 2330  . sodium chloride 0.9 % injection 3 mL  3 mL Intravenous Q12H Norval Morton, MD   3 mL at 05/02/15 0945   Labs: Basic Metabolic Panel:  Recent Labs Lab 05/01/15 1626 05/02/15 0748  NA 136 136  K 4.9 5.8*  CL 100* 100*  CO2 22 16*  GLUCOSE 112* 62*  BUN 57* 69*  CREATININE 8.56* 9.46*  CALCIUM 9.8 9.7  PHOS  --  5.4*   Liver Function Tests:  Recent Labs Lab 05/01/15 1626 05/02/15 0748  AST 17  --   ALT 13*  --   ALKPHOS 84  --   BILITOT 0.7  --   PROT 7.4  --   ALBUMIN 3.2* 3.0*    Recent Labs Lab 05/01/15 1626  LIPASE 24  CBC:  Recent Labs Lab 05/01/15 1626 05/02/15 0748  WBC 23.4* 22.3*  HGB 10.3* 10.9*  HCT 33.5* 33.6*  MCV 92.0 90.6  PLT 209 220   Studies/Results: Ct Abdomen Pelvis Wo Contrast  05/01/2015  CLINICAL DATA:  Right lower quadrant abdominal pain. Vomiting black bile. Dialysis. EXAM: CT ABDOMEN AND PELVIS WITHOUT CONTRAST TECHNIQUE: Multidetector CT imaging of the abdomen and pelvis was performed following the standard protocol without IV contrast. COMPARISON:  None. FINDINGS: Lower chest: Right greater than left basilar airspace densities are noted. The heart is enlarged. Mitral annular calcifications are present. The ventricular chambers are hypodense suggesting anemia. There is no edema or effusion to suggest failure. Hepatobiliary: No focal hepatic lesions are present. The gallbladder and common bile duct are within normal limits. Pancreas: Unremarkable. Spleen: Diffuse granulomatous changes are present. Adrenals/Urinary Tract: The adrenal glands are normal bilaterally. Cystic changes of dialysis are present  bilaterally. Ureters are unremarkable. Urinary bladder is within normal limits. Stomach/Bowel: The stomach A and duodenum are within normal limits. Oral contrast is present throughout the small bowel and extends into the colon as far as the rectum. There is marked irregular wall thickening within the ascending colon and cecum. There is free fluid adjacent to the cecum. The appendix is discretely visualized, is of normal caliber, and is filled with contrast. Is best seen on image 60 of series 201. Vascular/Lymphatic: Extensive atherosclerotic calcifications are present in the aorta and branch vessels. There is no aneurysm. Reproductive: The uterus and adnexa are within normal limits. Other: Musculoskeletal: The bone windows are unremarkable. IMPRESSION: 1. Diffuse inflammatory changes about the cecum, ascending colon, and pericecal soft tissues. The appendix appears to be within normal limits, suggesting this is an acute process of the ascending colon and cecum. Diverticula are present in the ascending colon in this may represent diverticulitis. Infectious colitis is also considered. 2. Chronic renal cystic disease of dialysis. 3. Atherosclerosis. 4. Patchy bibasilar airspace disease. While this may represent atelectasis, early infection or aspiration is not excluded. These results were called by telephone at the time of interpretation on 05/01/2015 at 8:33 pm to Dr. Montine Circle , who verbally acknowledged these results. Electronically Signed   By: San Morelle M.D.   On: 05/01/2015 20:32   Dg Chest Port 1 View  05/01/2015  CLINICAL DATA:  Sepsis.  Shortness of breath. EXAM: PORTABLE CHEST 1 VIEW COMPARISON:  07/26/2014 FINDINGS: Cardiac enlargement with increased pulmonary vascular congestion. Hazy diffuse parenchymal infiltration is increasing since previous study suggesting increasing edema. Improved atelectasis in the lung bases. No  blunting of costophrenic angles. No pneumothorax. Mediastinal  contours appear intact. IMPRESSION: Cardiac enlargement with vascular congestion and diffuse edema, increasing since previous study. Electronically Signed   By: Lucienne Capers M.D.   On: 05/01/2015 23:19    ROS: As per HPI otherwise negative. Physical Exam: HD being initiated - pre standing weight 63.3 Filed Vitals:   05/01/15 2144 05/01/15 2222 05/02/15 0537 05/02/15 1018  BP:  193/101 196/97 186/93  Pulse: 93 98 99 101  Temp:  98.6 F (37 C) 98.2 F (36.8 C) 98.7 F (37.1 C)  TempSrc:  Oral Oral Oral  Resp:  20 20 18   Height:      Weight:      SpO2: 95% 96% 94% 97%     General: Well developed, ill appearing AAF in no acute distress. Feels very warm to touch Head: Normocephalic, atraumatic, sclera non-icteric, mucus membranes are moist Neck: Supple. JVD not elevated. Lungs: crackles at bases breathing is unlabored. Heart: tachy regular rate 105 Abdomen: Soft, mod right sided tenderness M-S:  Strength and tone appear normal for age. Lower extremities:without edema or ischemic changes, no open wounds  Neuro: Alert and oriented X 3. Moves all extremities spontaneously. Psych:  Responds to questions appropriately with a normal affect. Dialysis Access:right upper AVF, aneurysmal - being cannulated  Dialysis Orders: [facility] 3.5 MWF 180 [name] 61 450/800 2 K 2 Ca profile 2 heparin 6400 Mircera 200 last 11/30 calcitriol 1.5 right upper AVF Recent labs:  Hgb 10 stable 27% sat 11/9 ferritin 834 - Oct URR ok Ca/P ok iPTH 321 stable  Assessment/Plan: 1. Abdominal pain - with leukocytosis, colonic infllammation on CT.  Cipro and flagyl started. 2. ESRD -  MWF - HD today - needs slightly lower heparin dose at d/c due to decline in [name] 3. Hypertension/volume  - not quite getting to [name] BP high at outpt Hd unit - averal UF 2-3 L- continue labetolol 600 (2-300s) tid , norvasc 10 HS  and lisinopril 40 at 6 pm; clonidine 0.1 bid recently added.  There is a disparity between the labetolol dose at of the  HD center with what is in [facility] records. I have clarified this with her and actually Dr. Mercy Moore increased her to 2.5 - 300 mg tab tid.   Will increase labetolol to 600 tid since clonidine has been added; titrate edw as able. 4. Anemia  - Hgb 10.9 tending up - had been on 200 Mircera - will give Aranesp 150 5. Metabolic bone disease -  Continue calcitriol 1.5, fosrenol 6. Nutrition - changed to renal diet - continue vitamin 7. SOB/hypoxia - only 2.3 above [name] - edema on CXR - sats ok on O2 - titrate edw down; probably some degree of COPD. 8. Tobacco abuse - plans to stop smoking the first of the year  Burgess Amor Inman [phone] 05/02/2015, 11:52 AM   Pt seen, examined, agree w assess/plan as above with additions as indicated. ESRD patient w abd pain/ diarrhea for 1-2 days.  WBC up, colon inflamed by CT.  Is on cipro/flagyl, stool studies pending.  She has impressive pulm edema on CXR, plan HD today w maximum vol removal. Repeat cxr tonight for clearing.   [name] MD [employer] pager [phone]    cell [phone] 05/02/2015, 1:22 PM

## 2015-05-02 NOTE — Progress Notes (Signed)
Utilization review completed. Jadrien Narine, RN, BSN. 

## 2015-05-03 LAB — BASIC METABOLIC PANEL
Anion gap: 12 (ref 5–15)
BUN: 26 mg/dL — AB (ref 6–20)
CHLORIDE: 95 mmol/L — AB (ref 101–111)
CO2: 26 mmol/L (ref 22–32)
CREATININE: 6.04 mg/dL — AB (ref 0.44–1.00)
Calcium: 8.6 mg/dL — ABNORMAL LOW (ref 8.9–10.3)
GFR calc Af Amer: 8 mL/min — ABNORMAL LOW (ref >60)
GFR calc non Af Amer: 7 mL/min — ABNORMAL LOW (ref >60)
Glucose, Bld: 91 mg/dL (ref 65–99)
Potassium: 4 mmol/L (ref 3.5–5.1)
SODIUM: 133 mmol/L — AB (ref 135–145)

## 2015-05-03 NOTE — Progress Notes (Signed)
[facility] PROGRESS NOTE  [name] [id number] DOB: [birth year] DOA: 05/01/2015 PCP: Benito Mccreedy, MD Brief History: Patient is a 54 year old female with a past medical history significant for ESRD on HD M/W/F, HTN, AOCD, lupus, mitral stenosis, CVA w/o residual deficits; who presents with complaints of severe abdominal pain. CT abd and pelvis in ED, showed diffuse inflammatory changes about the cecum, ascending colon and pericecal soft tissues, infectious colitis is considered.  She was started on IV antibiotics and referred to admission. Her abdominal pain has resolved, but her diarrhea persists.   Assessment/Plan: Colitis/ diverticulitis: - admitted for IV antibiotics.resume IV Ciprofloxacin and IV flagyl.  - improving. Normal lactic acid and elevated pro calcitonin level.     Hyperkalemia: - resolved with HD.   Acute respiratory failure with hypoxia with CXR showing interstitial edema - resolved with HD treatments.  - wean her off the oxygen as able. .  - repeat CXR shows improvement in the pulmonary edema.     ESRD on MWF: Further management as per renal.    Hypertension: Better controlled.  Currently on clonidine, labetalol, amlodipine,   Lupus stable.    Leukocytosis: Possibly from the diverticulitis,  Blood cultures done and pending .   Mild hyponatremia:  Monitor.    Code Status: full code.  Family Communication: none at bedside Disposition Plan: pending.    Consultants:  renal  Procedures:  HD  Antibiotics:  cipro   flagyl  HPI/Subjective: Comfortable.   Objective: Filed Vitals:   05/03/15 0431 05/03/15 0857  BP: 152/81 135/77  Pulse: 90 90  Temp: 98.5 F (36.9 C) 98 F (36.7 C)  Resp: 19 20    Intake/Output Summary (Last 24 hours) at 05/03/15 1138 Last data filed at 05/03/15 1038  Gross per 24 hour  Intake    600 ml  Output   1856 ml  Net  -1256 ml   Filed Weights   05/02/15 1234 05/02/15 1604 05/02/15  2104  Weight: 63.3 kg (139 lb 8.8 oz) 61 kg (134 lb 7.7 oz) 61.2 kg (134 lb 14.7 oz)    Exam:   General:  Alert and comfortbale  Cardiovascular: s1s2  Respiratory: diminished at bases, no wheezing or rhonchi.  Abdomen: soft non tender bs+  Musculoskeletal: pedal edema.   Data Reviewed: Basic Metabolic Panel:  Recent Labs Lab 05/01/15 1626 05/02/15 0748 05/02/15 2136 05/03/15 0535  NA 136 136  --  133*  K 4.9 5.8* 4.0 4.0  CL 100* 100*  --  95*  CO2 22 16*  --  26  GLUCOSE 112* 62*  --  91  BUN 57* 69*  --  26*  CREATININE 8.56* 9.46*  --  6.04*  CALCIUM 9.8 9.7  --  8.6*  PHOS  --  5.4*  --   --    Liver Function Tests:  Recent Labs Lab 05/01/15 1626 05/02/15 0748  AST 17  --   ALT 13*  --   ALKPHOS 84  --   BILITOT 0.7  --   PROT 7.4  --   ALBUMIN 3.2* 3.0*    Recent Labs Lab 05/01/15 1626  LIPASE 24   No results for input(s): AMMONIA in the last 168 hours. CBC:  Recent Labs Lab 05/01/15 1626 05/02/15 0748  WBC 23.4* 22.3*  HGB 10.3* 10.9*  HCT 33.5* 33.6*  MCV 92.0 90.6  PLT 209 220   Cardiac Enzymes: No results for input(s): CKTOTAL, CKMB, CKMBINDEX, TROPONINI in the last 168 hours. BNP (last  3 results)  Recent Labs  07/25/14 2140  BNP 2460.9*    ProBNP (last 3 results) No results for input(s): PROBNP in the last 8760 hours.  CBG: No results for input(s): GLUCAP in the last 168 hours.  No results found for this or any previous visit (from the past 240 hour(s)).   Studies: Ct Abdomen Pelvis Wo Contrast  05/01/2015  CLINICAL DATA:  Right lower quadrant abdominal pain. Vomiting black bile. Dialysis. EXAM: CT ABDOMEN AND PELVIS WITHOUT CONTRAST TECHNIQUE: Multidetector CT imaging of the abdomen and pelvis was performed following the standard protocol without IV contrast. COMPARISON:  None. FINDINGS: Lower chest: Right greater than left basilar airspace densities are noted. The heart is enlarged. Mitral annular calcifications are  present. The ventricular chambers are hypodense suggesting anemia. There is no edema or effusion to suggest failure. Hepatobiliary: No focal hepatic lesions are present. The gallbladder and common bile duct are within normal limits. Pancreas: Unremarkable. Spleen: Diffuse granulomatous changes are present. Adrenals/Urinary Tract: The adrenal glands are normal bilaterally. Cystic changes of dialysis are present bilaterally. Ureters are unremarkable. Urinary bladder is within normal limits. Stomach/Bowel: The stomach A and duodenum are within normal limits. Oral contrast is present throughout the small bowel and extends into the colon as far as the rectum. There is marked irregular wall thickening within the ascending colon and cecum. There is free fluid adjacent to the cecum. The appendix is discretely visualized, is of normal caliber, and is filled with contrast. Is best seen on image 60 of series 201. Vascular/Lymphatic: Extensive atherosclerotic calcifications are present in the aorta and branch vessels. There is no aneurysm. Reproductive: The uterus and adnexa are within normal limits. Other: Musculoskeletal: The bone windows are unremarkable. IMPRESSION: 1. Diffuse inflammatory changes about the cecum, ascending colon, and pericecal soft tissues. The appendix appears to be within normal limits, suggesting this is an acute process of the ascending colon and cecum. Diverticula are present in the ascending colon in this may represent diverticulitis. Infectious colitis is also considered. 2. Chronic renal cystic disease of dialysis. 3. Atherosclerosis. 4. Patchy bibasilar airspace disease. While this may represent atelectasis, early infection or aspiration is not excluded. These results were called by telephone at the time of interpretation on 05/01/2015 at 8:33 pm to Dr. Montine Circle , who verbally acknowledged these results. Electronically Signed   By: San Morelle M.D.   On: 05/01/2015 20:32   Dg  Chest 2 View  05/02/2015  CLINICAL DATA:  Pulmonary edema, cough, end-stage renal disease. EXAM: CHEST - 2 VIEW COMPARISON:  05/01/2015 FINDINGS: Significant improvement in pulmonary edema. Bibasilar atelectasis present. There may be a trace amount of pleural fluid bilaterally. The heart size is stable. IMPRESSION: Significant improvement in pulmonary edema. Electronically Signed   By: Aletta Edouard M.D.   On: 05/02/2015 20:04   Dg Chest Port 1 View  05/01/2015  CLINICAL DATA:  Sepsis.  Shortness of breath. EXAM: PORTABLE CHEST 1 VIEW COMPARISON:  07/26/2014 FINDINGS: Cardiac enlargement with increased pulmonary vascular congestion. Hazy diffuse parenchymal infiltration is increasing since previous study suggesting increasing edema. Improved atelectasis in the lung bases. No blunting of costophrenic angles. No pneumothorax. Mediastinal contours appear intact. IMPRESSION: Cardiac enlargement with vascular congestion and diffuse edema, increasing since previous study. Electronically Signed   By: Lucienne Capers M.D.   On: 05/01/2015 23:19    Scheduled Meds: . amLODipine  10 mg Oral QHS  . aspirin  81 mg Oral Daily  . atorvastatin  20 mg  Oral q1800  . calcitRIOL  1.5 mcg Oral Q M,W,F-HD  . ciprofloxacin  400 mg Intravenous Q24H  . cloNIDine  0.1 mg Oral BID  . [START ON 05/04/2015] darbepoetin (ARANESP) injection - DIALYSIS  150 mcg Intravenous Q Wed-HD  . heparin  5,000 Units Subcutaneous 3 times per day  . labetalol  600 mg Oral 3 times per day  . lanthanum  2,000 mg Oral TID WC  . metronidazole  500 mg Intravenous Q8H  . multivitamin  1 tablet Oral QHS  . pantoprazole  40 mg Oral BID  . sodium chloride  3 mL Intravenous Q12H   Continuous Infusions:   Principal Problem:   Colitis Active Problems:   Hypertension   End-stage renal disease on hemodialysis (HCC)   Lupus (systemic lupus erythematosus) (HCC)   Anemia   Mitral stenosis   Sepsis ([place])   Hypoxia    Time spent: 25  minutes.     [place] [facility] Pager [phone] . If 7PM-7AM, please contact night-coverage at [email or web address], password [facility] 05/03/2015, 11:38 AM  LOS: 2 days

## 2015-05-03 NOTE — Progress Notes (Signed)
[facility] Progress Note  Assessment/Plan: 1. Abdominal pain - with leukocytosis, colonic infllammation on CT. Cipro and flagyl started. Pain better but now having loose stools  2. ESRD - MWF - HD Wed - needs slightly lower heparin dose at d/c due to decline in [name]  3. Hypertension/volume - had not been quite getting to [name] BP high at outpt Hd unit - averal UF 2-3 L- continue labetolol 600 (2-300s) tid , norvasc 10 HS and lisinopril 40 at 6 pm; clonidine 0.1 bid recently added. There is a disparity between the labetolol dose at of the HD center with what is in [facility] records. I have clarified this with her and actually Dr. Mercy Moore increased her to 2.5 - 300 mg tab tid. Will increase labetolol to 600 tid since clonidine has been added;  4. Anemia - Hgb 10.9 tending up - had been on 200 Mircera - will give Aranesp 150 5. Metabolic bone disease - Continue calcitriol 1.5, fosrenol 6. Nutrition - changed to renal diet - continue vitamin 7. SOB/hypoxia - only 2.3 above [name] Monday- edema on CXR -post HD CXR greatly improved with UF volume of 1856 and post weight 61. sats ok on O2 -  probably some degree of COPD.- try for slightly lower [name] on Wed since she is only wearing a hospital gown 8. Tobacco abuse - plans to stop smoking the first of the year [name], PA-C [place] [phone] 05/03/2015,9:34 AM  LOS: 2 days   Pt seen, examined and agree w A/P as above. CXR better, not sure HD helped , may have had some noncardiogenic edema for it to resolve w only 1.8kg ultrafiltration. Plan HD tomorrow. Diarrhea a little better.   [name] MD [facility] pager [phone]    cell [phone] 05/03/2015, 12:54 PM   Subjective:   Breathing much better; had only a twinge of cramp yesterday  Objective Filed Vitals:   05/02/15 1604 05/02/15 1730 05/02/15 2104 05/03/15 0431  BP: 197/106 179/96 144/81 152/81  Pulse: 107 107 96 90   Temp:  99.5 F (37.5 C) 98.8 F (37.1 C) 98.5 F (36.9 C)  TempSrc:  Oral Oral Oral  Resp: 23  20 19   Height:      Weight: 61 kg (134 lb 7.7 oz)  61.2 kg (134 lb 14.7 oz)   SpO2:  96% 93% 95%   Physical Exam General:NAD Heart: RRR Lungs: no rales Abdomen: soft + BS mild R sided tenderness Extremities: no LE edema Dialysis Access: right upper AVF + bruit  Dialysis Orders: [facility] 3.5 MWF 180 [name] 61 450/800 2 K 2 Ca profile 2 heparin 6400 Mircera 200 last 11/30 calcitriol 1.5 right upper AVF Recent labs: Hgb 10 stable 27% sat 11/9 ferritin 834 - Oct URR ok Ca/P ok iPTH 321 stable   Additional Objective Labs: Basic Metabolic Panel:  Recent Labs Lab 05/01/15 1626 05/02/15 0748 05/02/15 2136 05/03/15 0535  NA 136 136  --  133*  K 4.9 5.8* 4.0 4.0  CL 100* 100*  --  95*  CO2 22 16*  --  26  GLUCOSE 112* 62*  --  91  BUN 57* 69*  --  26*  CREATININE 8.56* 9.46*  --  6.04*  CALCIUM 9.8 9.7  --  8.6*  PHOS  --  5.4*  --   --    Liver Function Tests:  Recent Labs Lab 05/01/15 1626 05/02/15 0748  AST 17  --  ALT 13*  --   ALKPHOS 84  --   BILITOT 0.7  --   PROT 7.4  --   ALBUMIN 3.2* 3.0*    Recent Labs Lab 05/01/15 1626  LIPASE 24   CBC:  Recent Labs Lab 05/01/15 1626 05/02/15 0748  WBC 23.4* 22.3*  HGB 10.3* 10.9*  HCT 33.5* 33.6*  MCV 92.0 90.6  PLT 209 220   Medications:   . amLODipine  10 mg Oral QHS  . aspirin  81 mg Oral Daily  . atorvastatin  20 mg Oral q1800  . calcitRIOL  1.5 mcg Oral Q M,W,F-HD  . ciprofloxacin  400 mg Intravenous Q24H  . cloNIDine  0.1 mg Oral BID  . [START ON 05/04/2015] darbepoetin (ARANESP) injection - DIALYSIS  150 mcg Intravenous Q Wed-HD  . heparin  5,000 Units Subcutaneous 3 times per day  . labetalol  600 mg Oral 3 times per day  . lanthanum  2,000 mg Oral TID WC  . metronidazole  500 mg Intravenous Q8H  . multivitamin  1 tablet Oral QHS  . pantoprazole  40 mg Oral BID  . sodium chloride  3 mL  Intravenous Q12H

## 2015-05-04 DIAGNOSIS — D72829 Elevated white blood cell count, unspecified: Secondary | ICD-10-CM | POA: Insufficient documentation

## 2015-05-04 DIAGNOSIS — I10 Essential (primary) hypertension: Secondary | ICD-10-CM

## 2015-05-04 DIAGNOSIS — R0902 Hypoxemia: Secondary | ICD-10-CM

## 2015-05-04 DIAGNOSIS — K529 Noninfective gastroenteritis and colitis, unspecified: Secondary | ICD-10-CM

## 2015-05-04 DIAGNOSIS — Z992 Dependence on renal dialysis: Secondary | ICD-10-CM

## 2015-05-04 DIAGNOSIS — N186 End stage renal disease: Secondary | ICD-10-CM

## 2015-05-04 LAB — CBC
HEMATOCRIT: 29.2 % — AB (ref 36.0–46.0)
Hemoglobin: 9 g/dL — ABNORMAL LOW (ref 12.0–15.0)
MCH: 28.2 pg (ref 26.0–34.0)
MCHC: 30.8 g/dL (ref 30.0–36.0)
MCV: 91.5 fL (ref 78.0–100.0)
PLATELETS: 164 10*3/uL (ref 150–400)
RBC: 3.19 MIL/uL — AB (ref 3.87–5.11)
RDW: 18.2 % — AB (ref 11.5–15.5)
WBC: 5.8 10*3/uL (ref 4.0–10.5)

## 2015-05-04 LAB — BASIC METABOLIC PANEL
Anion gap: 13 (ref 5–15)
BUN: 43 mg/dL — AB (ref 6–20)
CHLORIDE: 97 mmol/L — AB (ref 101–111)
CO2: 22 mmol/L (ref 22–32)
CREATININE: 8.24 mg/dL — AB (ref 0.44–1.00)
Calcium: 9 mg/dL (ref 8.9–10.3)
GFR calc non Af Amer: 5 mL/min — ABNORMAL LOW (ref >60)
GFR, EST AFRICAN AMERICAN: 6 mL/min — AB (ref >60)
GLUCOSE: 96 mg/dL (ref 65–99)
Potassium: 4.2 mmol/L (ref 3.5–5.1)
Sodium: 132 mmol/L — ABNORMAL LOW (ref 135–145)

## 2015-05-04 MED ORDER — CIPROFLOXACIN HCL 500 MG PO TABS
500.0000 mg | ORAL_TABLET | Freq: Every day | ORAL | Status: DC
Start: 2015-05-04 — End: 2015-05-05
  Administered 2015-05-04: 500 mg via ORAL
  Filled 2015-05-04: qty 1

## 2015-05-04 MED ORDER — CIPROFLOXACIN HCL 750 MG PO TABS: 400.0000 mg | ORAL_TABLET | Freq: Every day | ORAL | Status: DC

## 2015-05-04 MED ORDER — METRONIDAZOLE 500 MG PO TABS
500.0000 mg | ORAL_TABLET | Freq: Three times a day (TID) | ORAL | Status: DC
  Administered 2015-05-04 – 2015-05-05 (×4): 500 mg via ORAL
  Filled 2015-05-04 (×4): qty 1

## 2015-05-04 NOTE — Care Management Important Message (Signed)
Important Message  Patient Details  Name: [name] MRN: [id number] Date of Birth: 1960/07/25   Medicare Important Message Given:  Yes    Terri Rorrer P Dainel Arcidiacono 05/04/2015, 10:26 AM

## 2015-05-04 NOTE — Progress Notes (Signed)
Pharmacy Antibiotic Follow-up Note  [name] is a 54 y.o. year-old female admitted on 05/01/2015.  The patient is currently on day 4 of Cipro/Flagyl for Colitis/Diverticultis.  Assessment/Plan: -continue cipro 400 mg IV q24h -continue flagyl 500 mg IV q8h -f/u MD plan for IV to PO and LOT -monitor for s/sx of worsening infection   Temp (24hrs), Avg:98.5 F (36.9 C), Min:97.6 F (36.4 C), Max:98.9 F (37.2 C)   Recent Labs Lab 05/01/15 1626 05/02/15 0748 05/04/15 0427  WBC 23.4* 22.3* 5.8    Recent Labs Lab 05/01/15 1626 05/02/15 0748 05/03/15 0535 05/04/15 0427  CREATININE 8.56* 9.46* 6.04* 8.24*   Estimated Creatinine Clearance: 7.4 mL/min (by C-G formula based on Cr of 8.24).    HD MWF - Last HD 12/14: BFR 400 x 4 hours  No Known Allergies  Antimicrobials this admission: 12/11 Cipro >> 12/11 Flagyl >>  Microbiology results: 12/11 BCx x2 >> NG1D  Thank you for allowing pharmacy to be a part of this patient's care.  [place] PharmD 05/04/2015 10:46 AM

## 2015-05-04 NOTE — Progress Notes (Signed)
[facility] PROGRESS NOTE  Oscar Casaday [id number] DOB: Dec 12, 1960 DOA: 05/01/2015 PCP: Benito Mccreedy, MD  Assessment/Plan: #1 colitis versus diverticulitis Patient admitted with severe abdominal pain CT abdomen and pelvis showing diffuse inflammatory changes in the cecum, ascending colon, the sickle cell soft tissues and also diverticula noted on central colitis versus diverticulitis. Patient with clinical improvement. Patient is afebrile. Abdominal pain improved. Patient denies any diarrhea. Leukocytosis has improved. Patient currently on IV ciprofloxacin and Flagyl. We'll transition to oral antibiotics today. Continue supportive care. Follow.  #2 hyperkalemia Resolved with hemodialysis.  #3 acute respiratory failure with hypoxia with chest x-ray showing interstitial edema Likely secondary to volume overload. Resolved with hemodialysis treatments. Follow. On hemodialysis. Renal following.  #4 end-stage renal disease on hemodialysis Monday Wednesdays Fridays Per nephrology.  #5 hypertension Stable. Continue current regimen of labetalol, Norvasc, clonidine.  #6 leukocytosis Likely secondary to problem #1. Blood cultures were no growth to date. Continue empiric antibiotics.  #7 mild hyponatremia Likely secondary to hypervolemic hyponatremia. Improving on hemodialysis. Follow.  #8 prophylaxis PPI for GI prophylaxis. SCDs for DVT prophylaxis.  Code Status: Full Family Communication: Updated patient. No family at bedside. Disposition Plan: Home when abd pain improved, diarrhea improved and tolerating oral medications, hopefully tommorrow   Consultants:  Renal: Dr Melvia Heaps 05/02/2015  Procedures:  CT abd/Pelvis 05/01/2015  Antibiotics:  IV ciprofloxacin 05/01/2015  IV Flagyl 05/01/2015  HPI/Subjective: Patient states abdominal pain improved. Some nausea however improving. No emesis. Patient denies any diarrhea. Tolerating current diet. No shortness of breath.  No chest pain.  Objective: Filed Vitals:   05/04/15 1200 05/04/15 1246  BP: 184/96 161/88  Pulse: 80 84  Temp: 97.4 F (36.3 C) 98.5 F (36.9 C)  Resp: 20 20    Intake/Output Summary (Last 24 hours) at 05/04/15 1425 Last data filed at 05/04/15 1200  Gross per 24 hour  Intake    340 ml  Output   1544 ml  Net  -1204 ml   Filed Weights   05/02/15 2104 05/04/15 0837 05/04/15 1200  Weight: 61.2 kg (134 lb 14.7 oz) 62.7 kg (138 lb 3.7 oz) 61.3 kg (135 lb 2.3 oz)    Exam:   General:  NAD  Cardiovascular: RRR  Respiratory: CTAB  Abdomen: Soft, nontender, nondistended, positive bowel sounds.   Musculoskeletal: No clubbing cyanosis or edema.  Data Reviewed: Basic Metabolic Panel:  Recent Labs Lab 05/01/15 1626 05/02/15 0748 05/02/15 2136 05/03/15 0535 05/04/15 0427  NA 136 136  --  133* 132*  K 4.9 5.8* 4.0 4.0 4.2  CL 100* 100*  --  95* 97*  CO2 22 16*  --  26 22  GLUCOSE 112* 62*  --  91 96  BUN 57* 69*  --  26* 43*  CREATININE 8.56* 9.46*  --  6.04* 8.24*  CALCIUM 9.8 9.7  --  8.6* 9.0  PHOS  --  5.4*  --   --   --    Liver Function Tests:  Recent Labs Lab 05/01/15 1626 05/02/15 0748  AST 17  --   ALT 13*  --   ALKPHOS 84  --   BILITOT 0.7  --   PROT 7.4  --   ALBUMIN 3.2* 3.0*    Recent Labs Lab 05/01/15 1626  LIPASE 24   No results for input(s): AMMONIA in the last 168 hours. CBC:  Recent Labs Lab 05/01/15 1626 05/02/15 0748 05/04/15 0427  WBC 23.4* 22.3* 5.8  HGB 10.3* 10.9* 9.0*  HCT 33.5* 33.6*  29.2*  MCV 92.0 90.6 91.5  PLT 209 220 164   Cardiac Enzymes: No results for input(s): CKTOTAL, CKMB, CKMBINDEX, TROPONINI in the last 168 hours. BNP (last 3 results)  Recent Labs  07/25/14 2140  BNP 2460.9*    ProBNP (last 3 results) No results for input(s): PROBNP in the last 8760 hours.  CBG: No results for input(s): GLUCAP in the last 168 hours.  Recent Results (from the past 240 hour(s))  Culture, blood (x 2)      Status: None (Preliminary result)   Collection Time: 05/01/15 11:46 PM  Result Value Ref Range Status   Specimen Description BLOOD LEFT HAND  Final   Special Requests BOTTLES DRAWN AEROBIC AND ANAEROBIC 8CC   Final   Culture NO GROWTH 2 DAYS  Final   Report Status PENDING  Incomplete  Culture, blood (x 2)     Status: None (Preliminary result)   Collection Time: 05/01/15 11:51 PM  Result Value Ref Range Status   Specimen Description BLOOD LEFT HAND  Final   Special Requests BOTTLES DRAWN AEROBIC ONLY [place]  Final   Culture NO GROWTH 2 DAYS  Final   Report Status PENDING  Incomplete     Studies: Dg Chest 2 View  05/02/2015  CLINICAL DATA:  Pulmonary edema, cough, end-stage renal disease. EXAM: CHEST - 2 VIEW COMPARISON:  05/01/2015 FINDINGS: Significant improvement in pulmonary edema. Bibasilar atelectasis present. There may be a trace amount of pleural fluid bilaterally. The heart size is stable. IMPRESSION: Significant improvement in pulmonary edema. Electronically Signed   By: Aletta Edouard M.D.   On: 05/02/2015 20:04    Scheduled Meds: . amLODipine  10 mg Oral QHS  . aspirin  81 mg Oral Daily  . atorvastatin  20 mg Oral q1800  . calcitRIOL  1.5 mcg Oral Q M,W,F-HD  . ciprofloxacin  400 mg Intravenous Q24H  . cloNIDine  0.1 mg Oral BID  . darbepoetin (ARANESP) injection - DIALYSIS  150 mcg Intravenous Q Wed-HD  . heparin  5,000 Units Subcutaneous 3 times per day  . labetalol  600 mg Oral 3 times per day  . lanthanum  2,000 mg Oral TID WC  . metronidazole  500 mg Intravenous Q8H  . multivitamin  1 tablet Oral QHS  . pantoprazole  40 mg Oral BID  . sodium chloride  3 mL Intravenous Q12H   Continuous Infusions:   Principal Problem:   Colitis Active Problems:   Hypertension   End-stage renal disease on hemodialysis (HCC)   Lupus (systemic lupus erythematosus) (HCC)   Anemia   Mitral stenosis   Sepsis ([place])   Hypoxia    Time spent: 33  minutes    THOMPSON,DANIEL M.D. [facility] Pager [phone]. If 7PM-7AM, please contact night-coverage at [email or web address], password [facility] 05/04/2015, 2:25 PM  LOS: 3 days

## 2015-05-04 NOTE — Progress Notes (Signed)
[facility] Progress Note  Assessment/Plan: 1. Abdominal pain - WBC normalized colonic infllammation on CT.on IV Cipro and flagyl  Improving.  2. ESRD - MWF - HD Wed - needs slightly lower heparin dose at d/c due to decline in [name] - titrate edw down K 4.2 use 3 K bath 3. Hypertension/volume - had not been quite getting to [name] BP high at outpt Hd unit - averal UF 2-3 L- continue labetolol 600 (2-300s) tid , norvasc 10 HS and lisinopril 40 at 6 pm; clonidine 0.1 bid recently added. There is a disparity between the labetolol dose at of the HD center with what is in [facility] records. I have clarified this with her and actually Dr. Mercy Moore increased her to 2.5 - 300 mg tab tid. Increased labetolol to 600 tid since clonidine has been added;  BP ok, continue to lower volume 4. Anemia - Hgb 10.9 tending up - had been on 200 Mircera - will give Aranesp 150- Hgb down to 9 12/14- watch 5. Metabolic bone disease - Continue calcitriol 1.5, fosrenol 6. Nutrition - changed to renal diet - continue vitamin 7. SOB/hypoxia - only 2.3 above [name] Monday- edema on CXR -post HD CXR greatly improved with UF volume of 1856 and post weight 61. sats ok on O2 - probably some degree of COPD.- today 1.7 above [name] but in hospital gown so maybe more - try for slightly lower edw CXR findings may have represented aspiration pneumonitis, cleared up greatly overnight w/o much fluid taken off w HD.  8. Tobacco abuse - plans to stop smoking the first of the year  [name], PA-C [place] [phone] 05/04/2015,8:31 AM  LOS: 3 days   Pt seen, examined, agree w assess/plan as above with additions as indicated.  [name] MD [facility] pager [phone]    cell [phone] 05/04/2015, 9:27 AM     Subjective:   Still a little sore, but not requiring pain medicine.  Objective Filed Vitals:   05/03/15 0857 05/03/15 1837 05/03/15 2148 05/04/15 0548  BP:  135/77 142/69 166/84 166/84  Pulse: 90 87 80 77  Temp: 98 F (36.7 C) 98.7 F (37.1 C) 98.9 F (37.2 C) 98.7 F (37.1 C)  TempSrc: Oral Oral Oral Oral  Resp: 20 20 16 16   Height:      Weight:      SpO2: 95% 96% 97% 100%   Physical Exam standing weight 62.7 pre HD General: NAD Heart: RRR Lungs: crackles left up 1/2 none on right Abdomen: soft mild right sided tenderness Extremities: no edema Dialysis Access: right upper AVF - being cannulated  Dialysis Orders: [facility] 3.5 MWF 180 [name] 61 450/800 2 K 2 Ca profile 2 heparin 6400 Mircera 200 last 11/30 calcitriol 1.5 right upper AVF Recent labs: Hgb 10 stable 27% sat 11/9 ferritin 834 - Oct URR ok Ca/P ok iPTH 321 stable  Additional Objective Labs: Basic Metabolic Panel:  Recent Labs Lab 05/02/15 0748 05/02/15 2136 05/03/15 0535 05/04/15 0427  NA 136  --  133* 132*  K 5.8* 4.0 4.0 4.2  CL 100*  --  95* 97*  CO2 16*  --  26 22  GLUCOSE 62*  --  91 96  BUN 69*  --  26* 43*  CREATININE 9.46*  --  6.04* 8.24*  CALCIUM 9.7  --  8.6* 9.0  PHOS 5.4*  --   --   --    Liver Function Tests:  Recent Labs  Lab 05/01/15 1626 05/02/15 0748  AST 17  --   ALT 13*  --   ALKPHOS 84  --   BILITOT 0.7  --   PROT 7.4  --   ALBUMIN 3.2* 3.0*    Recent Labs Lab 05/01/15 1626  LIPASE 24   CBC:  Recent Labs Lab 05/01/15 1626 05/02/15 0748 05/04/15 0427  WBC 23.4* 22.3* 5.8  HGB 10.3* 10.9* 9.0*  HCT 33.5* 33.6* 29.2*  MCV 92.0 90.6 91.5  PLT 209 220 164   Blood Culture    Component Value Date/Time   SDES BLOOD LEFT HAND 05/01/2015 2351   SPECREQUEST BOTTLES DRAWN AEROBIC ONLY [place] 05/01/2015 2351   CULT NO GROWTH 1 DAY 05/01/2015 2351   REPTSTATUS PENDING 05/01/2015 2351  Studies/Results: Dg Chest 2 View  05/02/2015  CLINICAL DATA:  Pulmonary edema, cough, end-stage renal disease. EXAM: CHEST - 2 VIEW COMPARISON:  05/01/2015 FINDINGS: Significant improvement in pulmonary edema. Bibasilar atelectasis present. There  may be a trace amount of pleural fluid bilaterally. The heart size is stable. IMPRESSION: Significant improvement in pulmonary edema. Electronically Signed   By: Aletta Edouard M.D.   On: 05/02/2015 20:04   Medications:   . amLODipine  10 mg Oral QHS  . aspirin  81 mg Oral Daily  . atorvastatin  20 mg Oral q1800  . calcitRIOL  1.5 mcg Oral Q M,W,F-HD  . ciprofloxacin  400 mg Intravenous Q24H  . cloNIDine  0.1 mg Oral BID  . darbepoetin (ARANESP) injection - DIALYSIS  150 mcg Intravenous Q Wed-HD  . heparin  5,000 Units Subcutaneous 3 times per day  . labetalol  600 mg Oral 3 times per day  . lanthanum  2,000 mg Oral TID WC  . metronidazole  500 mg Intravenous Q8H  . multivitamin  1 tablet Oral QHS  . pantoprazole  40 mg Oral BID  . sodium chloride  3 mL Intravenous Q12H

## 2015-05-05 LAB — RENAL FUNCTION PANEL
ANION GAP: 13 (ref 5–15)
Albumin: 2.7 g/dL — ABNORMAL LOW (ref 3.5–5.0)
BUN: 26 mg/dL — ABNORMAL HIGH (ref 6–20)
CALCIUM: 9 mg/dL (ref 8.9–10.3)
CHLORIDE: 97 mmol/L — AB (ref 101–111)
CO2: 24 mmol/L (ref 22–32)
Creatinine, Ser: 5.85 mg/dL — ABNORMAL HIGH (ref 0.44–1.00)
GFR, EST AFRICAN AMERICAN: 9 mL/min — AB (ref >60)
GFR, EST NON AFRICAN AMERICAN: 7 mL/min — AB (ref >60)
Glucose, Bld: 86 mg/dL (ref 65–99)
Phosphorus: 6.1 mg/dL — ABNORMAL HIGH (ref 2.5–4.6)
Potassium: 3.8 mmol/L (ref 3.5–5.1)
Sodium: 134 mmol/L — ABNORMAL LOW (ref 135–145)

## 2015-05-05 LAB — CBC
HCT: 30.1 % — ABNORMAL LOW (ref 36.0–46.0)
HEMOGLOBIN: 9.1 g/dL — AB (ref 12.0–15.0)
MCH: 27.8 pg (ref 26.0–34.0)
MCHC: 30.2 g/dL (ref 30.0–36.0)
MCV: 92 fL (ref 78.0–100.0)
Platelets: 207 10*3/uL (ref 150–400)
RBC: 3.27 MIL/uL — AB (ref 3.87–5.11)
RDW: 18 % — ABNORMAL HIGH (ref 11.5–15.5)
WBC: 3.4 10*3/uL — AB (ref 4.0–10.5)

## 2015-05-05 MED ORDER — METRONIDAZOLE 500 MG PO TABS: 500.0000 mg | ORAL_TABLET | Freq: Three times a day (TID) | ORAL | Status: DC

## 2015-05-05 MED ORDER — SIMVASTATIN 20 MG PO TABS: 20.0000 mg | ORAL_TABLET | Freq: Every evening | ORAL | Status: DC

## 2015-05-05 MED ORDER — CIPROFLOXACIN HCL 500 MG PO TABS: 500.0000 mg | ORAL_TABLET | Freq: Every day | ORAL | Status: DC

## 2015-05-05 MED ORDER — LABETALOL HCL 300 MG PO TABS: 600.0000 mg | ORAL_TABLET | Freq: Three times a day (TID) | ORAL | Status: DC

## 2015-05-05 MED ORDER — LANTHANUM CARBONATE 1000 MG PO CHEW: 2000.0000 mg | CHEWABLE_TABLET | Freq: Three times a day (TID) | ORAL | Status: DC

## 2015-05-05 MED ORDER — ONDANSETRON HCL 4 MG PO TABS: 4.0000 mg | ORAL_TABLET | Freq: Four times a day (QID) | ORAL | Status: DC | PRN

## 2015-05-05 MED ORDER — LISINOPRIL 40 MG PO TABS
40.0000 mg | ORAL_TABLET | Freq: Every day | ORAL | Status: DC
  Administered 2015-05-05: 40 mg via ORAL
  Filled 2015-05-05: qty 1

## 2015-05-05 MED ORDER — CALCITRIOL 0.5 MCG PO CAPS: 1.5000 ug | ORAL_CAPSULE | ORAL | Status: DC

## 2015-05-05 NOTE — Discharge Summary (Signed)
Physician Discharge Summary  [name] [id number] DOB: 1960/06/08 DOA: 05/01/2015  PCP: Benito Mccreedy, MD  Admit date: 05/01/2015 Discharge date: 05/05/2015  Time spent: 65 minutes  Recommendations for Outpatient Follow-up:  1. Follow-up with OSEI-BONSU,GEORGE, MD in 2 weeks. Follow-up patient's colitis would need to be reassessed at that time. Patient may need referral to gastroenterology as outpatient. Patient's blood pressure needs to be reassessed on follow-up. 2. Follow-up at hemodialysis center as scheduled tomorrow Friday, 05/06/2015.   Discharge Diagnoses:  Principal Problem:   Colitis Active Problems:   Hypertension   End-stage renal disease on hemodialysis (HCC)   Lupus (systemic lupus erythematosus) ([place])   Anemia   Mitral stenosis   Sepsis ([place])   Hypoxia   Leukocytosis   Discharge Condition: Stable and improved.  Diet recommendation: Renal diet.  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 Symptoms started the night prior to admission, and she described acute onset of sharp pain on the right and left sides of her abdomen. She initially question the pizza that she had eaten earlier that night and had tried to take some Pepto-Bismol without relief of symptoms. Pain was constant and localized more to her right side throughout the night. Social symptoms including subjective fever, nausea, vomiting, and loose stool. She still had her appendix. Normally has dialysis on Mondays. Denied recent travel outside the [place] or any sick contacts but noted that her daughter worked here at the hospital and possibly brought  something hometo her. Patient noted that she was not on oxygen at home, but did suffer from the flu sometime in March of this year and had to be placed on oxygen for short period in time. Currently, she still smoked 1-2 cigarettes per day.   Upon admission into the hospital patient was found to have O2 sats in the 87% on room air placed to nasal cannula oxygen with improvement of O2 sats greater than 90%. Lab work showed elevated white blood cell count of 23.4 K/uL. CT scan of abdomen showed inflammatory changes in the ascending colon and pericecal soft tissues.  Hospital Course:  #1 colitis versus diverticulitis Patient admitted with severe abdominal pain CT abdomen and pelvis showing diffuse inflammatory changes in the cecum, ascending colon, the cecal soft tissues and also diverticula noted on central colitis versus diverticulitis. Patient was placed empirically on IV ciprofloxacin and Flagyl. Patient remained afebrile. Patient improved clinically during the hospitalization. Abdominal pain improved. Leukocytosis on admission was 23.4 and trended down and had resolved by day of discharge. Patient was subsequently transitioned from IV ciprofloxacin and IV Flagyl to oral ciprofloxacin and Flagyl. Patient be discharged home on 4 more days of oral antibiotics to complete a one-week course of antibiotic therapy. Outpatient follow-up.   #2 hyperkalemia Resolved with hemodialysis.  #3 acute respiratory failure with hypoxia with chest x-ray showing interstitial edema Likely secondary to volume overload. Resolved with hemodialysis treatments.   #4 end-stage renal disease on hemodialysis Monday Wednesdays Fridays Patient was seen in consultation by nephrology during the hospitalization and underwent hemodialysis on her dialysis days. Patient will follow-up at the hemodialysis center.   #  5 hypertension Stable. Patient was maintained on labetalol, Norvasc, clonidine, for blood pressure control during the  hospitalization. Outpatient follow-up.  #6 leukocytosis Likely secondary to problem #1. Blood cultures were no growth to date. Continue empiric antibiotics.  #7 mild hyponatremia Likely secondary to hypervolemic hyponatremia. Improved on hemodialysis.    Procedures:  CT abd/Pelvis 05/01/2015  Consultations:  Renal: Dr Jonnie Finner 05/02/2015  Discharge Exam: Filed Vitals:   05/05/15 0505 05/05/15 0909  BP: 163/85 172/86  Pulse: 79 81  Temp: 97.5 F (36.4 C) 98.7 F (37.1 C)  Resp: 18 18    General: NAD Cardiovascular: RRR Respiratory: CTAB  Discharge Instructions   Discharge Instructions    Diet - low sodium heart healthy    Complete by:  As directed      Discharge instructions    Complete by:  As directed   Follow up with OSEI-BONSU,GEORGE, MD in 2 weeks.     Increase activity slowly    Complete by:  As directed           Current Discharge Medication List    START taking these medications   Details  calcitRIOL (ROCALTROL) 0.5 MCG capsule Take 3 capsules (1.5 mcg total) by mouth every Monday, Wednesday, and Friday with hemodialysis. Qty: 12 capsule, Refills: 0    ciprofloxacin (CIPRO) 500 MG tablet Take 1 tablet (500 mg total) by mouth at bedtime. Take for 4 days then stop. Qty: 4 tablet, Refills: 0    !! lanthanum (FOSRENOL) 1000 MG chewable tablet Chew 2 tablets (2,000 mg total) by mouth 3 (three) times daily with meals. Qty: 180 tablet, Refills: 0    metroNIDAZOLE (FLAGYL) 500 MG tablet Take 1 tablet (500 mg total) by mouth every 8 (eight) hours. Take for 4 days then stop. Qty: 12 tablet, Refills: 0    ondansetron (ZOFRAN) 4 MG tablet Take 1 tablet (4 mg total) by mouth every 6 (six) hours as needed for nausea. Qty: 20 tablet, Refills: 0     !! - Potential duplicate medications found. Please discuss with provider.    CONTINUE these medications which have CHANGED   Details  simvastatin (ZOCOR) 20 MG tablet Take 1 tablet (20 mg total) by mouth every  evening. Qty: 30 tablet, Refills: 0      CONTINUE these medications which have NOT CHANGED   Details  amLODipine (NORVASC) 10 MG tablet Take 10 mg by mouth daily.      aspirin 81 MG chewable tablet Chew 1 tablet (81 mg total) by mouth daily.    b complex-vitamin c-folic acid (NEPHRO-VITE) 0.8 MG TABS tablet Take 0.8 mg by mouth at bedtime.    cloNIDine (CATAPRES) 0.1 MG tablet Take 1 tablet (0.1 mg total) by mouth 2 (two) times daily. Qty: 180 tablet, Refills: 0    !! FOSRENOL 1000 MG chewable tablet Chew 1,000-2,000 mg by mouth 5 (five) times daily. Takes 2 tabs with meals and 1 tab with snacks Refills: 6    labetalol (NORMODYNE) 100 MG tablet Take 100 mg by mouth 3 (three) times daily.    lisinopril (PRINIVIL,ZESTRIL) 40 MG tablet Take 40 mg by mouth daily.  Refills: 3    pantoprazole (PROTONIX) 40 MG tablet Take 1 tablet (40 mg total) by mouth 2 (two) times daily. Qty: 60 tablet, Refills: 0     !! - Potential duplicate medications found. Please discuss with provider.     No Known Allergies Follow-up Information    Follow up with OSEI-BONSU,GEORGE, MD. Schedule an appointment  as soon as possible for a visit in 2 weeks.   Specialty:  Internal Medicine   Contact information:   [id number] [place] [address] [phone]       Please follow up.   Why:  f/u in HD as usual.       The results of significant diagnostics from this hospitalization (including imaging, microbiology, ancillary and laboratory) are listed below for reference.    Significant Diagnostic Studies: Ct Abdomen Pelvis Wo Contrast  05/01/2015  CLINICAL DATA:  Right lower quadrant abdominal pain. Vomiting black bile. Dialysis. EXAM: CT ABDOMEN AND PELVIS WITHOUT CONTRAST TECHNIQUE: Multidetector CT imaging of the abdomen and pelvis was performed following the standard protocol without IV contrast. COMPARISON:  None. FINDINGS: Lower chest: Right greater than left basilar airspace  densities are noted. The heart is enlarged. Mitral annular calcifications are present. The ventricular chambers are hypodense suggesting anemia. There is no edema or effusion to suggest failure. Hepatobiliary: No focal hepatic lesions are present. The gallbladder and common bile duct are within normal limits. Pancreas: Unremarkable. Spleen: Diffuse granulomatous changes are present. Adrenals/Urinary Tract: The adrenal glands are normal bilaterally. Cystic changes of dialysis are present bilaterally. Ureters are unremarkable. Urinary bladder is within normal limits. Stomach/Bowel: The stomach A and duodenum are within normal limits. Oral contrast is present throughout the small bowel and extends into the colon as far as the rectum. There is marked irregular wall thickening within the ascending colon and cecum. There is free fluid adjacent to the cecum. The appendix is discretely visualized, is of normal caliber, and is filled with contrast. Is best seen on image 60 of series 201. Vascular/Lymphatic: Extensive atherosclerotic calcifications are present in the aorta and branch vessels. There is no aneurysm. Reproductive: The uterus and adnexa are within normal limits. Other: Musculoskeletal: The bone windows are unremarkable. IMPRESSION: 1. Diffuse inflammatory changes about the cecum, ascending colon, and pericecal soft tissues. The appendix appears to be within normal limits, suggesting this is an acute process of the ascending colon and cecum. Diverticula are present in the ascending colon in this may represent diverticulitis. Infectious colitis is also considered. 2. Chronic renal cystic disease of dialysis. 3. Atherosclerosis. 4. Patchy bibasilar airspace disease. While this may represent atelectasis, early infection or aspiration is not excluded. These results were called by telephone at the time of interpretation on 05/01/2015 at 8:33 pm to Dr. Montine Circle , who verbally acknowledged these results.  Electronically Signed   By: San Morelle M.D.   On: 05/01/2015 20:32   Dg Chest 2 View  05/02/2015  CLINICAL DATA:  Pulmonary edema, cough, end-stage renal disease. EXAM: CHEST - 2 VIEW COMPARISON:  05/01/2015 FINDINGS: Significant improvement in pulmonary edema. Bibasilar atelectasis present. There may be a trace amount of pleural fluid bilaterally. The heart size is stable. IMPRESSION: Significant improvement in pulmonary edema. Electronically Signed   By: Aletta Edouard M.D.   On: 05/02/2015 20:04   Dg Chest Port 1 View  05/01/2015  CLINICAL DATA:  Sepsis.  Shortness of breath. EXAM: PORTABLE CHEST 1 VIEW COMPARISON:  07/26/2014 FINDINGS: Cardiac enlargement with increased pulmonary vascular congestion. Hazy diffuse parenchymal infiltration is increasing since previous study suggesting increasing edema. Improved atelectasis in the lung bases. No blunting of costophrenic angles. No pneumothorax. Mediastinal contours appear intact. IMPRESSION: Cardiac enlargement with vascular congestion and diffuse edema, increasing since previous study. Electronically Signed   By: Lucienne Capers M.D.   On: 05/01/2015 23:19  Microbiology: Recent Results (from the past 240 hour(s))  Culture, blood (x 2)     Status: None (Preliminary result)   Collection Time: 05/01/15 11:46 PM  Result Value Ref Range Status   Specimen Description BLOOD LEFT HAND  Final   Special Requests BOTTLES DRAWN AEROBIC AND ANAEROBIC 8CC   Final   Culture NO GROWTH 2 DAYS  Final   Report Status PENDING  Incomplete  Culture, blood (x 2)     Status: None (Preliminary result)   Collection Time: 05/01/15 11:51 PM  Result Value Ref Range Status   Specimen Description BLOOD LEFT HAND  Final   Special Requests BOTTLES DRAWN AEROBIC ONLY [place]  Final   Culture NO GROWTH 2 DAYS  Final   Report Status PENDING  Incomplete     Labs: Basic Metabolic Panel:  Recent Labs Lab 05/01/15 1626 05/02/15 0748 05/02/15 2136  05/03/15 0535 05/04/15 0427 05/05/15 0725  NA 136 136  --  133* 132* 134*  K 4.9 5.8* 4.0 4.0 4.2 3.8  CL 100* 100*  --  95* 97* 97*  CO2 22 16*  --  26 22 24   GLUCOSE 112* 62*  --  91 96 86  BUN 57* 69*  --  26* 43* 26*  CREATININE 8.56* 9.46*  --  6.04* 8.24* 5.85*  CALCIUM 9.8 9.7  --  8.6* 9.0 9.0  PHOS  --  5.4*  --   --   --  6.1*   Liver Function Tests:  Recent Labs Lab 05/01/15 1626 05/02/15 0748 05/05/15 0725  AST 17  --   --   ALT 13*  --   --   ALKPHOS 84  --   --   BILITOT 0.7  --   --   PROT 7.4  --   --   ALBUMIN 3.2* 3.0* 2.7*    Recent Labs Lab 05/01/15 1626  LIPASE 24   No results for input(s): AMMONIA in the last 168 hours. CBC:  Recent Labs Lab 05/01/15 1626 05/02/15 0748 05/04/15 0427 05/05/15 0725  WBC 23.4* 22.3* 5.8 3.4*  HGB 10.3* 10.9* 9.0* 9.1*  HCT 33.5* 33.6* 29.2* 30.1*  MCV 92.0 90.6 91.5 92.0  PLT 209 220 164 207   Cardiac Enzymes: No results for input(s): CKTOTAL, CKMB, CKMBINDEX, TROPONINI in the last 168 hours. BNP: BNP (last 3 results)  Recent Labs  07/25/14 2140  BNP 2460.9*    ProBNP (last 3 results) No results for input(s): PROBNP in the last 8760 hours.  CBG: No results for input(s): GLUCAP in the last 168 hours.     SignedIrine Seal MD [facility] 05/05/2015, 12:49 PM

## 2015-05-05 NOTE — Progress Notes (Signed)
[name] Progress Note  Assessment/Plan: 1. Abdominal pain - per primary. No C/O abdominal pain-continue flagyl and cipro. Hoping to be [place] home today.  2. ESRD-MWF [facility]. Will have HD in center tomorrow if [place].  3. Hypertension/volume - BP still elevated, will evaluate [name] upon DC. Continue antihypertensives as ordered (On four different medications!) HD yesterday Net UF 1544. Post wt 61.3 kg. Slightly above OP [name].  4. Anemia - Hgb 9.1. ESA dose ordered 05/04/15-not given. Will need dose given tomorrow. Follow HGB  5. Metabolic bone disease - Continue calcitriol 1.5, fosrenol 6. Nutrition - changed to renal diet - continue vitamin 7. SOB/hypoxia - O2 sats 100. Denies SOB. Issue resolved.  8. Tobacco abuse - plans to stop smoking the first of the year.   Connie H. Brown NP-C 05/05/2015, 10:51 AM  [name] [phone]  Pt seen, examined and agree w A/P as above. Ready for dc, no change in BP meds needed. Have d/w primary MD.   [name] MD [place] pager [phone]    cell [phone] 05/05/2015, 12:49 PM    Subjective:  "I haven't seen any doctor yet but I hope I go home today". No Abdominal pain, still having some diarrhea. No other C/Os.   Objective Filed Vitals:   05/04/15 1246 05/04/15 2032 05/05/15 0505 05/05/15 0909  BP: 161/88 171/91 163/85 172/86  Pulse: 84 76 79 81  Temp: 98.5 F (36.9 C) 98.4 F (36.9 C) 97.5 F (36.4 C) 98.7 F (37.1 C)  TempSrc: Oral Oral Oral Oral  Resp: 20 16 18 18   Height:      Weight:      SpO2: 100% 100% 100% 100%   Physical Exam General: Well nourished, cooperative female in NAD Heart: S1, S2, RRR. No M/R/G Lungs: Bilateral breath sounds decreased in bases essentially clear Abdomen: abdomen soft, nontender Extremities: No LLE.  Dialysis Access: RUA AVF + thrill/+ bruit  Dialysis Orders: [facility]:  MonWedFri 3 hrs 30 min, 180NRe Optiflux, BFR 450, DFR Manual 800 mL/min, [name]  61 (kg), Dialysate 2.0 K, 2.0 Ca, UFR Profile: Profile 2, Sodium Model: None, Access:  RUA AV Fistula Heparin: 6400 units q treatment Mircera 200 mcg IV q 2 weeks (Last dose 04/20/2015) Calcitriol: 1.5 mcg PO q MWF     Additional Objective Labs: Basic Metabolic Panel:  Recent Labs Lab 05/02/15 0748  05/03/15 0535 05/04/15 0427 05/05/15 0725  NA 136  --  133* 132* 134*  K 5.8*  < > 4.0 4.2 3.8  CL 100*  --  95* 97* 97*  CO2 16*  --  26 22 24   GLUCOSE 62*  --  91 96 86  BUN 69*  --  26* 43* 26*  CREATININE 9.46*  --  6.04* 8.24* 5.85*  CALCIUM 9.7  --  8.6* 9.0 9.0  PHOS 5.4*  --   --   --  6.1*  < > = values in this interval not displayed. Liver Function Tests:  Recent Labs Lab 05/01/15 1626 05/02/15 0748 05/05/15 0725  AST 17  --   --   ALT 13*  --   --   ALKPHOS 84  --   --   BILITOT 0.7  --   --   PROT 7.4  --   --   ALBUMIN 3.2* 3.0* 2.7*    Recent Labs Lab 05/01/15 1626  LIPASE 24   CBC:  Recent Labs Lab 05/01/15 1626 05/02/15 0748 05/04/15 0427 05/05/15 0725  WBC 23.4* 22.3* 5.8 3.4*  HGB 10.3* 10.9* 9.0* 9.1*  HCT 33.5* 33.6* 29.2* 30.1*  MCV 92.0 90.6 91.5 92.0  PLT 209 220 164 207   Blood Culture    Component Value Date/Time   SDES BLOOD LEFT HAND 05/01/2015 2351   SPECREQUEST BOTTLES DRAWN AEROBIC ONLY [place] 05/01/2015 2351   CULT NO GROWTH 2 DAYS 05/01/2015 2351   REPTSTATUS PENDING 05/01/2015 2351    Cardiac Enzymes: No results for input(s): CKTOTAL, CKMB, CKMBINDEX, TROPONINI in the last 168 hours. CBG: No results for input(s): GLUCAP in the last 168 hours. Iron Studies: No results for input(s): IRON, TIBC, TRANSFERRIN, FERRITIN in the last 72 hours. @lablastinr3 @ Studies/Results: No results found. Medications:   . amLODipine  10 mg Oral QHS  . aspirin  81 mg Oral Daily  . atorvastatin  20 mg Oral q1800  . calcitRIOL  1.5 mcg Oral Q M,W,F-HD  . ciprofloxacin  500 mg Oral QHS  . cloNIDine  0.1 mg Oral BID  . darbepoetin  (ARANESP) injection - DIALYSIS  150 mcg Intravenous Q Wed-HD  . heparin  5,000 Units Subcutaneous 3 times per day  . labetalol  600 mg Oral 3 times per day  . lanthanum  2,000 mg Oral TID WC  . lisinopril  40 mg Oral Daily  . metroNIDAZOLE  500 mg Oral 3 times per day  . multivitamin  1 tablet Oral QHS  . pantoprazole  40 mg Oral BID  . sodium chloride  3 mL Intravenous Q12H

## 2015-05-05 NOTE — Progress Notes (Signed)
Patient Discharge: Disposition: Patient discharged to home. Education: Reviewed about medications, prescriptions, follow-up appointments, discharge instructions and also about colitis, understood and acknowledged. IV: Discontinued IV before discharge. Telemetry: Discontinued before discharge. Transportation: patient transported in w/c out of the unit accompanied by the family and the staff. Belongings: Patient took all her belongings with her.

## 2015-05-07 LAB — CULTURE, BLOOD (ROUTINE X 2)
CULTURE: NO GROWTH
Culture: NO GROWTH

## 2015-05-10 LAB — OPIATES,MS,WB/SP RFX
6-Acetylmorphine: NEGATIVE
Codeine: NEGATIVE ng/mL
DIHYDROCODEINE: NEGATIVE ng/mL
HYDROCODONE: 4 ng/mL
HYDROMORPHONE: NEGATIVE ng/mL
Morphine: NEGATIVE ng/mL
Opiate Confirmation: POSITIVE

## 2015-05-10 LAB — OXYCODONES,MS,WB/SP RFX
OXYCOCONE: NEGATIVE ng/mL
OXYMORPHONE: NEGATIVE ng/mL
Oxycodones Confirmation: NEGATIVE

## 2015-05-11 LAB — COCAINE,MS,WB/SP RFX
Benzoylecgonine: 354 ng/mL
COCAINE CONFIRMATION: POSITIVE
COCAINE: NEGATIVE ng/mL

## 2015-05-11 LAB — THC,MS,WB/SP RFX
CANNABINOL: NEGATIVE ng/mL
CARBOXY-THC: 74.4 ng/mL
Cannabidiol: NEGATIVE ng/mL
Cannabinoid Confirmation: POSITIVE
HYDROXY-THC: 2.9 ng/mL
Tetrahydrocannabinol(THC): 2.3 ng/mL

## 2015-05-11 LAB — DRUG SCREEN 10 W/CONF, SERUM
Amphetamines, IA: NEGATIVE ng/mL
BARBITURATES, IA: NEGATIVE ug/mL
Benzodiazepines, IA: NEGATIVE ng/mL
Cocaine & Metabolite, IA: POSITIVE ng/mL
METHADONE, IA: NEGATIVE ng/mL
OXYCODONES, IA: NEGATIVE ng/mL
Opiates, IA: POSITIVE ng/mL
PHENCYCLIDINE, IA: NEGATIVE ng/mL
PROPOXYPHENE, IA: NEGATIVE ng/mL
THC(MARIJUANA) METABOLITE, IA: POSITIVE ng/mL

## 2015-05-11 IMAGING — NM NM MYOCAR MULTI W/ SPECT
3 series · 18 of 18 positions shown · non-contrast
Comparison: none

[Series 1: stress_(id)_sa · 6.5mm · 6.51mm/px · 6 of 64 frames shown (1 of 2)]
[frame 6/64]
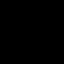
[frame 16/64]
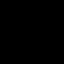
[frame 27/64]
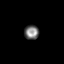
[frame 38/64]
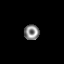
[frame 48/64]
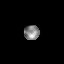
[frame 59/64]
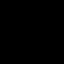

[Series 1: rest_(id)_sa · 6.5mm · 6.51mm/px · 6 of 64 frames shown]
[frame 6/64]
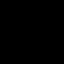
[frame 16/64]
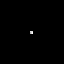
[frame 27/64]
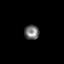
[frame 38/64]
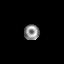
[frame 48/64]
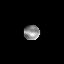
[frame 59/64]
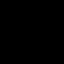

[Series 1: stress_(id)_sa · 6.5mm · 6.51mm/px · 6 of 512 frames shown (2 of 2)]
[frame 43/512]
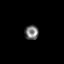
[frame 128/512]
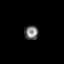
[frame 214/512]
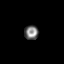
[frame 299/512]
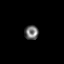
[frame 384/512]
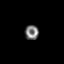
[frame 470/512]
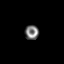

[18 of 18 positions shown; findings below may reference images not displayed]

Canned report from images found in remote index.

Refer to host system for actual result text.

## 2015-05-12 ENCOUNTER — Ambulatory Visit (HOSPITAL_COMMUNITY): Payer: Medicare Other | Attending: Cardiovascular Disease

## 2015-05-12 ENCOUNTER — Other Ambulatory Visit: Payer: Self-pay

## 2015-05-12 DIAGNOSIS — I050 Rheumatic mitral stenosis: Secondary | ICD-10-CM | POA: Insufficient documentation

## 2015-05-21 DIAGNOSIS — Z992 Dependence on renal dialysis: Secondary | ICD-10-CM | POA: Diagnosis not present

## 2015-05-21 DIAGNOSIS — I120 Hypertensive chronic kidney disease with stage 5 chronic kidney disease or end stage renal disease: Secondary | ICD-10-CM | POA: Diagnosis not present

## 2015-05-21 DIAGNOSIS — N186 End stage renal disease: Secondary | ICD-10-CM | POA: Diagnosis not present

## 2015-05-23 DIAGNOSIS — D631 Anemia in chronic kidney disease: Secondary | ICD-10-CM | POA: Diagnosis not present

## 2015-05-23 DIAGNOSIS — M3210 Systemic lupus erythematosus, organ or system involvement unspecified: Secondary | ICD-10-CM | POA: Diagnosis not present

## 2015-05-23 DIAGNOSIS — N2581 Secondary hyperparathyroidism of renal origin: Secondary | ICD-10-CM | POA: Diagnosis not present

## 2015-05-23 DIAGNOSIS — D509 Iron deficiency anemia, unspecified: Secondary | ICD-10-CM | POA: Diagnosis not present

## 2015-05-23 DIAGNOSIS — N186 End stage renal disease: Secondary | ICD-10-CM | POA: Diagnosis not present

## 2015-05-25 DIAGNOSIS — D509 Iron deficiency anemia, unspecified: Secondary | ICD-10-CM | POA: Diagnosis not present

## 2015-05-25 DIAGNOSIS — N2581 Secondary hyperparathyroidism of renal origin: Secondary | ICD-10-CM | POA: Diagnosis not present

## 2015-05-25 DIAGNOSIS — D631 Anemia in chronic kidney disease: Secondary | ICD-10-CM | POA: Diagnosis not present

## 2015-05-25 DIAGNOSIS — N186 End stage renal disease: Secondary | ICD-10-CM | POA: Diagnosis not present

## 2015-05-25 DIAGNOSIS — M3210 Systemic lupus erythematosus, organ or system involvement unspecified: Secondary | ICD-10-CM | POA: Diagnosis not present

## 2015-05-27 DIAGNOSIS — D631 Anemia in chronic kidney disease: Secondary | ICD-10-CM | POA: Diagnosis not present

## 2015-05-27 DIAGNOSIS — M3210 Systemic lupus erythematosus, organ or system involvement unspecified: Secondary | ICD-10-CM | POA: Diagnosis not present

## 2015-05-27 DIAGNOSIS — N186 End stage renal disease: Secondary | ICD-10-CM | POA: Diagnosis not present

## 2015-05-27 DIAGNOSIS — D509 Iron deficiency anemia, unspecified: Secondary | ICD-10-CM | POA: Diagnosis not present

## 2015-05-27 DIAGNOSIS — N2581 Secondary hyperparathyroidism of renal origin: Secondary | ICD-10-CM | POA: Diagnosis not present

## 2015-05-30 DIAGNOSIS — D509 Iron deficiency anemia, unspecified: Secondary | ICD-10-CM | POA: Diagnosis not present

## 2015-05-30 DIAGNOSIS — D631 Anemia in chronic kidney disease: Secondary | ICD-10-CM | POA: Diagnosis not present

## 2015-05-30 DIAGNOSIS — N186 End stage renal disease: Secondary | ICD-10-CM | POA: Diagnosis not present

## 2015-05-30 DIAGNOSIS — N2581 Secondary hyperparathyroidism of renal origin: Secondary | ICD-10-CM | POA: Diagnosis not present

## 2015-05-30 DIAGNOSIS — M3210 Systemic lupus erythematosus, organ or system involvement unspecified: Secondary | ICD-10-CM | POA: Diagnosis not present

## 2015-06-01 DIAGNOSIS — D631 Anemia in chronic kidney disease: Secondary | ICD-10-CM | POA: Diagnosis not present

## 2015-06-01 DIAGNOSIS — N186 End stage renal disease: Secondary | ICD-10-CM | POA: Diagnosis not present

## 2015-06-01 DIAGNOSIS — D509 Iron deficiency anemia, unspecified: Secondary | ICD-10-CM | POA: Diagnosis not present

## 2015-06-01 DIAGNOSIS — M3210 Systemic lupus erythematosus, organ or system involvement unspecified: Secondary | ICD-10-CM | POA: Diagnosis not present

## 2015-06-01 DIAGNOSIS — N2581 Secondary hyperparathyroidism of renal origin: Secondary | ICD-10-CM | POA: Diagnosis not present

## 2015-06-03 DIAGNOSIS — N2581 Secondary hyperparathyroidism of renal origin: Secondary | ICD-10-CM | POA: Diagnosis not present

## 2015-06-03 DIAGNOSIS — D631 Anemia in chronic kidney disease: Secondary | ICD-10-CM | POA: Diagnosis not present

## 2015-06-03 DIAGNOSIS — M3210 Systemic lupus erythematosus, organ or system involvement unspecified: Secondary | ICD-10-CM | POA: Diagnosis not present

## 2015-06-03 DIAGNOSIS — D509 Iron deficiency anemia, unspecified: Secondary | ICD-10-CM | POA: Diagnosis not present

## 2015-06-03 DIAGNOSIS — N186 End stage renal disease: Secondary | ICD-10-CM | POA: Diagnosis not present

## 2015-06-06 DIAGNOSIS — N2581 Secondary hyperparathyroidism of renal origin: Secondary | ICD-10-CM | POA: Diagnosis not present

## 2015-06-06 DIAGNOSIS — D509 Iron deficiency anemia, unspecified: Secondary | ICD-10-CM | POA: Diagnosis not present

## 2015-06-06 DIAGNOSIS — N186 End stage renal disease: Secondary | ICD-10-CM | POA: Diagnosis not present

## 2015-06-06 DIAGNOSIS — D631 Anemia in chronic kidney disease: Secondary | ICD-10-CM | POA: Diagnosis not present

## 2015-06-06 DIAGNOSIS — M3210 Systemic lupus erythematosus, organ or system involvement unspecified: Secondary | ICD-10-CM | POA: Diagnosis not present

## 2015-06-08 DIAGNOSIS — N186 End stage renal disease: Secondary | ICD-10-CM | POA: Diagnosis not present

## 2015-06-08 DIAGNOSIS — D509 Iron deficiency anemia, unspecified: Secondary | ICD-10-CM | POA: Diagnosis not present

## 2015-06-08 DIAGNOSIS — M3210 Systemic lupus erythematosus, organ or system involvement unspecified: Secondary | ICD-10-CM | POA: Diagnosis not present

## 2015-06-08 DIAGNOSIS — D631 Anemia in chronic kidney disease: Secondary | ICD-10-CM | POA: Diagnosis not present

## 2015-06-08 DIAGNOSIS — N2581 Secondary hyperparathyroidism of renal origin: Secondary | ICD-10-CM | POA: Diagnosis not present

## 2015-06-10 DIAGNOSIS — D509 Iron deficiency anemia, unspecified: Secondary | ICD-10-CM | POA: Diagnosis not present

## 2015-06-10 DIAGNOSIS — D631 Anemia in chronic kidney disease: Secondary | ICD-10-CM | POA: Diagnosis not present

## 2015-06-10 DIAGNOSIS — M3210 Systemic lupus erythematosus, organ or system involvement unspecified: Secondary | ICD-10-CM | POA: Diagnosis not present

## 2015-06-10 DIAGNOSIS — N186 End stage renal disease: Secondary | ICD-10-CM | POA: Diagnosis not present

## 2015-06-10 DIAGNOSIS — N2581 Secondary hyperparathyroidism of renal origin: Secondary | ICD-10-CM | POA: Diagnosis not present

## 2015-06-13 DIAGNOSIS — M3210 Systemic lupus erythematosus, organ or system involvement unspecified: Secondary | ICD-10-CM | POA: Diagnosis not present

## 2015-06-13 DIAGNOSIS — D631 Anemia in chronic kidney disease: Secondary | ICD-10-CM | POA: Diagnosis not present

## 2015-06-13 DIAGNOSIS — D509 Iron deficiency anemia, unspecified: Secondary | ICD-10-CM | POA: Diagnosis not present

## 2015-06-13 DIAGNOSIS — N186 End stage renal disease: Secondary | ICD-10-CM | POA: Diagnosis not present

## 2015-06-13 DIAGNOSIS — N2581 Secondary hyperparathyroidism of renal origin: Secondary | ICD-10-CM | POA: Diagnosis not present

## 2015-06-15 DIAGNOSIS — N186 End stage renal disease: Secondary | ICD-10-CM | POA: Diagnosis not present

## 2015-06-15 DIAGNOSIS — M3210 Systemic lupus erythematosus, organ or system involvement unspecified: Secondary | ICD-10-CM | POA: Diagnosis not present

## 2015-06-15 DIAGNOSIS — D631 Anemia in chronic kidney disease: Secondary | ICD-10-CM | POA: Diagnosis not present

## 2015-06-15 DIAGNOSIS — D509 Iron deficiency anemia, unspecified: Secondary | ICD-10-CM | POA: Diagnosis not present

## 2015-06-15 DIAGNOSIS — N2581 Secondary hyperparathyroidism of renal origin: Secondary | ICD-10-CM | POA: Diagnosis not present

## 2015-06-17 DIAGNOSIS — D631 Anemia in chronic kidney disease: Secondary | ICD-10-CM | POA: Diagnosis not present

## 2015-06-17 DIAGNOSIS — D509 Iron deficiency anemia, unspecified: Secondary | ICD-10-CM | POA: Diagnosis not present

## 2015-06-17 DIAGNOSIS — N186 End stage renal disease: Secondary | ICD-10-CM | POA: Diagnosis not present

## 2015-06-17 DIAGNOSIS — N2581 Secondary hyperparathyroidism of renal origin: Secondary | ICD-10-CM | POA: Diagnosis not present

## 2015-06-17 DIAGNOSIS — M3210 Systemic lupus erythematosus, organ or system involvement unspecified: Secondary | ICD-10-CM | POA: Diagnosis not present

## 2015-06-20 DIAGNOSIS — N2581 Secondary hyperparathyroidism of renal origin: Secondary | ICD-10-CM | POA: Diagnosis not present

## 2015-06-20 DIAGNOSIS — N186 End stage renal disease: Secondary | ICD-10-CM | POA: Diagnosis not present

## 2015-06-20 DIAGNOSIS — M3210 Systemic lupus erythematosus, organ or system involvement unspecified: Secondary | ICD-10-CM | POA: Diagnosis not present

## 2015-06-20 DIAGNOSIS — D631 Anemia in chronic kidney disease: Secondary | ICD-10-CM | POA: Diagnosis not present

## 2015-06-20 DIAGNOSIS — D509 Iron deficiency anemia, unspecified: Secondary | ICD-10-CM | POA: Diagnosis not present

## 2015-06-21 DIAGNOSIS — N186 End stage renal disease: Secondary | ICD-10-CM | POA: Diagnosis not present

## 2015-06-21 DIAGNOSIS — I120 Hypertensive chronic kidney disease with stage 5 chronic kidney disease or end stage renal disease: Secondary | ICD-10-CM | POA: Diagnosis not present

## 2015-06-21 DIAGNOSIS — Z992 Dependence on renal dialysis: Secondary | ICD-10-CM | POA: Diagnosis not present

## 2015-06-22 DIAGNOSIS — M3210 Systemic lupus erythematosus, organ or system involvement unspecified: Secondary | ICD-10-CM | POA: Diagnosis not present

## 2015-06-22 DIAGNOSIS — N2581 Secondary hyperparathyroidism of renal origin: Secondary | ICD-10-CM | POA: Diagnosis not present

## 2015-06-22 DIAGNOSIS — D509 Iron deficiency anemia, unspecified: Secondary | ICD-10-CM | POA: Diagnosis not present

## 2015-06-22 DIAGNOSIS — N186 End stage renal disease: Secondary | ICD-10-CM | POA: Diagnosis not present

## 2015-06-23 DIAGNOSIS — Z136 Encounter for screening for cardiovascular disorders: Secondary | ICD-10-CM | POA: Diagnosis not present

## 2015-06-23 DIAGNOSIS — I10 Essential (primary) hypertension: Secondary | ICD-10-CM | POA: Diagnosis not present

## 2015-06-23 DIAGNOSIS — Z8719 Personal history of other diseases of the digestive system: Secondary | ICD-10-CM | POA: Diagnosis not present

## 2015-06-23 DIAGNOSIS — E785 Hyperlipidemia, unspecified: Secondary | ICD-10-CM | POA: Diagnosis not present

## 2015-06-23 DIAGNOSIS — Z720 Tobacco use: Secondary | ICD-10-CM | POA: Diagnosis not present

## 2015-06-23 DIAGNOSIS — Z8673 Personal history of transient ischemic attack (TIA), and cerebral infarction without residual deficits: Secondary | ICD-10-CM | POA: Diagnosis not present

## 2015-06-23 DIAGNOSIS — Z131 Encounter for screening for diabetes mellitus: Secondary | ICD-10-CM | POA: Diagnosis not present

## 2015-06-23 DIAGNOSIS — Z992 Dependence on renal dialysis: Secondary | ICD-10-CM | POA: Diagnosis not present

## 2015-06-23 DIAGNOSIS — N186 End stage renal disease: Secondary | ICD-10-CM | POA: Diagnosis not present

## 2015-06-24 DIAGNOSIS — N2581 Secondary hyperparathyroidism of renal origin: Secondary | ICD-10-CM | POA: Diagnosis not present

## 2015-06-24 DIAGNOSIS — M3210 Systemic lupus erythematosus, organ or system involvement unspecified: Secondary | ICD-10-CM | POA: Diagnosis not present

## 2015-06-24 DIAGNOSIS — N186 End stage renal disease: Secondary | ICD-10-CM | POA: Diagnosis not present

## 2015-06-24 DIAGNOSIS — D509 Iron deficiency anemia, unspecified: Secondary | ICD-10-CM | POA: Diagnosis not present

## 2015-06-27 DIAGNOSIS — D509 Iron deficiency anemia, unspecified: Secondary | ICD-10-CM | POA: Diagnosis not present

## 2015-06-27 DIAGNOSIS — N2581 Secondary hyperparathyroidism of renal origin: Secondary | ICD-10-CM | POA: Diagnosis not present

## 2015-06-27 DIAGNOSIS — N186 End stage renal disease: Secondary | ICD-10-CM | POA: Diagnosis not present

## 2015-06-27 DIAGNOSIS — M3210 Systemic lupus erythematosus, organ or system involvement unspecified: Secondary | ICD-10-CM | POA: Diagnosis not present

## 2015-06-29 DIAGNOSIS — M3210 Systemic lupus erythematosus, organ or system involvement unspecified: Secondary | ICD-10-CM | POA: Diagnosis not present

## 2015-06-29 DIAGNOSIS — N186 End stage renal disease: Secondary | ICD-10-CM | POA: Diagnosis not present

## 2015-06-29 DIAGNOSIS — N2581 Secondary hyperparathyroidism of renal origin: Secondary | ICD-10-CM | POA: Diagnosis not present

## 2015-06-29 DIAGNOSIS — D509 Iron deficiency anemia, unspecified: Secondary | ICD-10-CM | POA: Diagnosis not present

## 2015-07-01 DIAGNOSIS — M3210 Systemic lupus erythematosus, organ or system involvement unspecified: Secondary | ICD-10-CM | POA: Diagnosis not present

## 2015-07-01 DIAGNOSIS — D509 Iron deficiency anemia, unspecified: Secondary | ICD-10-CM | POA: Diagnosis not present

## 2015-07-01 DIAGNOSIS — N186 End stage renal disease: Secondary | ICD-10-CM | POA: Diagnosis not present

## 2015-07-01 DIAGNOSIS — N2581 Secondary hyperparathyroidism of renal origin: Secondary | ICD-10-CM | POA: Diagnosis not present

## 2015-07-04 DIAGNOSIS — N2581 Secondary hyperparathyroidism of renal origin: Secondary | ICD-10-CM | POA: Diagnosis not present

## 2015-07-04 DIAGNOSIS — M3210 Systemic lupus erythematosus, organ or system involvement unspecified: Secondary | ICD-10-CM | POA: Diagnosis not present

## 2015-07-04 DIAGNOSIS — N186 End stage renal disease: Secondary | ICD-10-CM | POA: Diagnosis not present

## 2015-07-04 DIAGNOSIS — D509 Iron deficiency anemia, unspecified: Secondary | ICD-10-CM | POA: Diagnosis not present

## 2015-07-06 DIAGNOSIS — N186 End stage renal disease: Secondary | ICD-10-CM | POA: Diagnosis not present

## 2015-07-06 DIAGNOSIS — D509 Iron deficiency anemia, unspecified: Secondary | ICD-10-CM | POA: Diagnosis not present

## 2015-07-06 DIAGNOSIS — M3210 Systemic lupus erythematosus, organ or system involvement unspecified: Secondary | ICD-10-CM | POA: Diagnosis not present

## 2015-07-06 DIAGNOSIS — N2581 Secondary hyperparathyroidism of renal origin: Secondary | ICD-10-CM | POA: Diagnosis not present

## 2015-07-08 DIAGNOSIS — N186 End stage renal disease: Secondary | ICD-10-CM | POA: Diagnosis not present

## 2015-07-08 DIAGNOSIS — D509 Iron deficiency anemia, unspecified: Secondary | ICD-10-CM | POA: Diagnosis not present

## 2015-07-08 DIAGNOSIS — M3210 Systemic lupus erythematosus, organ or system involvement unspecified: Secondary | ICD-10-CM | POA: Diagnosis not present

## 2015-07-08 DIAGNOSIS — N2581 Secondary hyperparathyroidism of renal origin: Secondary | ICD-10-CM | POA: Diagnosis not present

## 2015-07-11 DIAGNOSIS — N186 End stage renal disease: Secondary | ICD-10-CM | POA: Diagnosis not present

## 2015-07-11 DIAGNOSIS — M3210 Systemic lupus erythematosus, organ or system involvement unspecified: Secondary | ICD-10-CM | POA: Diagnosis not present

## 2015-07-11 DIAGNOSIS — N2581 Secondary hyperparathyroidism of renal origin: Secondary | ICD-10-CM | POA: Diagnosis not present

## 2015-07-11 DIAGNOSIS — D509 Iron deficiency anemia, unspecified: Secondary | ICD-10-CM | POA: Diagnosis not present

## 2015-07-12 ENCOUNTER — Encounter (HOSPITAL_COMMUNITY): Payer: Self-pay | Admitting: Emergency Medicine

## 2015-07-12 DIAGNOSIS — J329 Chronic sinusitis, unspecified: Secondary | ICD-10-CM | POA: Diagnosis not present

## 2015-07-12 DIAGNOSIS — Z992 Dependence on renal dialysis: Secondary | ICD-10-CM | POA: Insufficient documentation

## 2015-07-12 DIAGNOSIS — I120 Hypertensive chronic kidney disease with stage 5 chronic kidney disease or end stage renal disease: Secondary | ICD-10-CM | POA: Insufficient documentation

## 2015-07-12 DIAGNOSIS — F17210 Nicotine dependence, cigarettes, uncomplicated: Secondary | ICD-10-CM | POA: Diagnosis not present

## 2015-07-12 DIAGNOSIS — R197 Diarrhea, unspecified: Secondary | ICD-10-CM | POA: Diagnosis not present

## 2015-07-12 DIAGNOSIS — A048 Other specified bacterial intestinal infections: Secondary | ICD-10-CM | POA: Diagnosis not present

## 2015-07-12 DIAGNOSIS — R112 Nausea with vomiting, unspecified: Secondary | ICD-10-CM | POA: Diagnosis not present

## 2015-07-12 DIAGNOSIS — I10 Essential (primary) hypertension: Secondary | ICD-10-CM | POA: Diagnosis not present

## 2015-07-12 DIAGNOSIS — I5032 Chronic diastolic (congestive) heart failure: Secondary | ICD-10-CM | POA: Diagnosis not present

## 2015-07-12 DIAGNOSIS — Z862 Personal history of diseases of the blood and blood-forming organs and certain disorders involving the immune mechanism: Secondary | ICD-10-CM | POA: Insufficient documentation

## 2015-07-12 DIAGNOSIS — Z8711 Personal history of peptic ulcer disease: Secondary | ICD-10-CM | POA: Diagnosis not present

## 2015-07-12 DIAGNOSIS — R109 Unspecified abdominal pain: Secondary | ICD-10-CM | POA: Diagnosis not present

## 2015-07-12 DIAGNOSIS — N186 End stage renal disease: Secondary | ICD-10-CM | POA: Insufficient documentation

## 2015-07-12 DIAGNOSIS — Z7982 Long term (current) use of aspirin: Secondary | ICD-10-CM | POA: Diagnosis not present

## 2015-07-12 DIAGNOSIS — Z79899 Other long term (current) drug therapy: Secondary | ICD-10-CM | POA: Diagnosis not present

## 2015-07-12 DIAGNOSIS — Z8739 Personal history of other diseases of the musculoskeletal system and connective tissue: Secondary | ICD-10-CM | POA: Insufficient documentation

## 2015-07-12 DIAGNOSIS — K219 Gastro-esophageal reflux disease without esophagitis: Secondary | ICD-10-CM | POA: Insufficient documentation

## 2015-07-12 DIAGNOSIS — Z8742 Personal history of other diseases of the female genital tract: Secondary | ICD-10-CM | POA: Diagnosis not present

## 2015-07-12 LAB — CBC
HCT: 44 % (ref 36.0–46.0)
Hemoglobin: 13.5 g/dL (ref 12.0–15.0)
MCH: 28 pg (ref 26.0–34.0)
MCHC: 30.7 g/dL (ref 30.0–36.0)
MCV: 91.3 fL (ref 78.0–100.0)
PLATELETS: 207 10*3/uL (ref 150–400)
RBC: 4.82 MIL/uL (ref 3.87–5.11)
RDW: 18.1 % — AB (ref 11.5–15.5)
WBC: 7.3 10*3/uL (ref 4.0–10.5)

## 2015-07-12 LAB — COMPREHENSIVE METABOLIC PANEL
ALK PHOS: 102 U/L (ref 38–126)
ALT: 24 U/L (ref 14–54)
AST: 28 U/L (ref 15–41)
Albumin: 3.9 g/dL (ref 3.5–5.0)
Anion gap: 20 — ABNORMAL HIGH (ref 5–15)
BUN: 39 mg/dL — AB (ref 6–20)
CALCIUM: 10.5 mg/dL — AB (ref 8.9–10.3)
CHLORIDE: 88 mmol/L — AB (ref 101–111)
CO2: 30 mmol/L (ref 22–32)
CREATININE: 7.93 mg/dL — AB (ref 0.44–1.00)
GFR calc Af Amer: 6 mL/min — ABNORMAL LOW (ref >60)
GFR, EST NON AFRICAN AMERICAN: 5 mL/min — AB (ref >60)
Glucose, Bld: 125 mg/dL — ABNORMAL HIGH (ref 65–99)
Potassium: 5 mmol/L (ref 3.5–5.1)
Sodium: 138 mmol/L (ref 135–145)
Total Bilirubin: 0.6 mg/dL (ref 0.3–1.2)
Total Protein: 9.2 g/dL — ABNORMAL HIGH (ref 6.5–8.1)

## 2015-07-12 LAB — SAMPLE TO BLOOD BANK

## 2015-07-12 LAB — LIPASE, BLOOD: LIPASE: 48 U/L (ref 11–51)

## 2015-07-12 MED ORDER — ONDANSETRON 4 MG PO TBDP
ORAL_TABLET | ORAL | Status: AC
  Filled 2015-07-12: qty 2

## 2015-07-12 MED ORDER — ONDANSETRON 4 MG PO TBDP
8.0000 mg | ORAL_TABLET | Freq: Once | ORAL | Status: AC
  Administered 2015-07-12: 8 mg via ORAL

## 2015-07-12 NOTE — ED Notes (Signed)
Unable to give urine specimen at triage at this time .

## 2015-07-12 NOTE — ED Notes (Signed)
Pt. reports bloody emesis with diarrhea , generalized abdominal pain " upset stomach" onset today , hypertensive at triage , hemodialysis q Mon/Wed/Fri. Denies fever or chills.

## 2015-07-13 ENCOUNTER — Emergency Department (HOSPITAL_COMMUNITY)
Admission: EM | Admit: 2015-07-13 | Discharge: 2015-07-13 | Disposition: A | Discharge status: Home / Self Care | Payer: Medicare Other | Attending: Emergency Medicine | Admitting: Emergency Medicine

## 2015-07-13 DIAGNOSIS — R197 Diarrhea, unspecified: Secondary | ICD-10-CM

## 2015-07-13 DIAGNOSIS — R112 Nausea with vomiting, unspecified: Secondary | ICD-10-CM | POA: Diagnosis not present

## 2015-07-13 HISTORY — DX: Dependence on renal dialysis: Z99.2

## 2015-07-13 MED ORDER — ONDANSETRON 4 MG PO TBDP
8.0000 mg | ORAL_TABLET | Freq: Once | ORAL | Status: AC
Start: 2015-07-13 — End: 2015-07-13
  Administered 2015-07-13: 8 mg via ORAL
  Filled 2015-07-13: qty 2

## 2015-07-13 MED ORDER — ONDANSETRON 8 MG PO TBDP: 8.0000 mg | ORAL_TABLET | Freq: Three times a day (TID) | ORAL | Status: DC | PRN

## 2015-07-13 NOTE — ED Provider Notes (Signed)
CSN: [id number]     Arrival date & time 07/12/15  2108 History   By signing my name below, I, Nicole Kindred, attest that this documentation has been prepared under the direction and in the presence of Jola Schmidt, MD.   Electronically Signed: Nicole Kindred, ED Scribe. 07/13/2015. 2:35 AM     Chief Complaint  Patient presents with  . Hematemesis  . Diarrhea  . Abdominal Pain  . Hypertension    The history is provided by the patient. No language interpreter was used.   HPI Comments: [name] is a 55 y.o. female who presents to the Emergency Department complaining of gradual onset, constant vomiting, onset earlier today. Pt also complains of associated diarrhea, nausea, abdominal pain, and hematemesis. Pt states that he nausea medicine given in ED has alleviated her symptoms. No other worsening or alleviating factors noted. Pt denies any other pertinent symtpoms. Pt is on dialysis and received treatment this morning.     Past Medical History  Diagnosis Date  . Hypertension   . Blood transfusion '08    [facility]  . GERD (gastroesophageal reflux disease)   . ESRD (end stage renal disease) ([place])     dialysis   Mon Wed Fri  . Lupus ([place])   . Dysfunctional uterine bleeding 12/19/2010  . Anemia   . Peptic ulcer disease   . Hx of hiatal hernia   . Mitral stenosis     Echo 4/16:  EF 55-60%, no RWMA, Gr 1 DD, mod MS (mean 9 mmHg), mod LAE, mild RAE, PASP 65, mod to severe TR, trivial eff  . Chronic diastolic CHF (congestive heart failure) ([place])   . Hx of cardiovascular stress test     Lexiscan Myoview 4/16:  Normal stress nuclear study, EF 59%  . Hemodialysis patient [facility])    Past Surgical History  Procedure Laterality Date  . Dialysis fistula creation  2007  . Esophagogastroduodenoscopy N/A 07/31/2014    Procedure: ESOPHAGOGASTRODUODENOSCOPY (EGD);  Surgeon: Clarene Essex, MD;  Location: [facility] ENDOSCOPY;  Service: Endoscopy;  Laterality: N/A;   Family History  Problem Relation Age  of Onset  . Liver cancer Maternal Grandmother   . Lymphoma Maternal Aunt   . Heart attack Neg Hx    Social History  Substance Use Topics  . Smoking status: Current Some Day Smoker -- 0.25 packs/day    Types: Cigarettes  . Smokeless tobacco: None  . Alcohol Use: 0.0 oz/week    0 Standard drinks or equivalent per week     Comment: occasional   OB History    No data available     Review of Systems  A complete 10 system review of systems was obtained and all systems are negative except as noted in the HPI and PMH.    Allergies  Review of patient's allergies indicates no known allergies.  Home Medications   Prior to Admission medications   Medication Sig Start Date End Date Taking? Authorizing Provider  amLODipine (NORVASC) 10 MG tablet Take 10 mg by mouth daily.     Yes Historical Provider, MD  aspirin 81 MG chewable tablet Chew 1 tablet (81 mg total) by mouth daily. 08/01/14  Yes Domenic Polite, MD  b complex-vitamin c-folic acid (NEPHRO-VITE) 0.8 MG TABS tablet Take 0.8 mg by mouth at bedtime.   Yes Historical Provider, MD  calcitRIOL (ROCALTROL) 0.5 MCG capsule Take 3 capsules (1.5 mcg total) by mouth every Monday, Wednesday, and Friday with hemodialysis. 05/05/15  Yes Eugenie Filler, MD  cloNIDine (CATAPRES) 0.1 MG tablet Take 1 tablet (0.1 mg total) by mouth 2 (two) times daily. 04/26/15  Yes Eileen Stanford, PA-C  labetalol (NORMODYNE) 300 MG tablet Take 300 mg by mouth 3 (three) times daily.   Yes Historical Provider, MD  lanthanum (FOSRENOL) 1000 MG chewable tablet Chew 2 tablets (2,000 mg total) by mouth 3 (three) times daily with meals. 05/05/15  Yes Eugenie Filler, MD  lisinopril (PRINIVIL,ZESTRIL) 40 MG tablet Take 40 mg by mouth daily.  08/25/14  Yes Historical Provider, MD  ondansetron (ZOFRAN) 4 MG tablet Take 1 tablet (4 mg total) by mouth every 6 (six) hours as needed for nausea. 05/05/15  Yes Eugenie Filler, MD  pantoprazole (PROTONIX) 40 MG tablet Take 1  tablet (40 mg total) by mouth 2 (two) times daily. 08/01/14  Yes Domenic Polite, MD  simvastatin (ZOCOR) 20 MG tablet Take 1 tablet (20 mg total) by mouth every evening. 05/05/15  Yes Eugenie Filler, MD  ciprofloxacin (CIPRO) 500 MG tablet Take 1 tablet (500 mg total) by mouth at bedtime. Take for 4 days then stop. Patient not taking: Reported on 07/13/2015 05/05/15   Eugenie Filler, MD  labetalol (NORMODYNE) 100 MG tablet Take 100 mg by mouth 3 (three) times daily. Reported on 07/13/2015    Historical Provider, MD  metroNIDAZOLE (FLAGYL) 500 MG tablet Take 1 tablet (500 mg total) by mouth every 8 (eight) hours. Take for 4 days then stop. Patient not taking: Reported on 07/13/2015 05/05/15   Eugenie Filler, MD   BP 202/130 mmHg  Pulse 113  Temp(Src) 97.9 F (36.6 C) (Oral)  Resp 20  Ht 5\' 6"  (1.676 m)  Wt 129 lb (58.514 kg)  BMI 20.83 kg/m2  SpO2 99%  LMP 12/02/2010 Physical Exam  Constitutional: She is oriented to person, place, and time. She appears well-developed and well-nourished.  HENT:  Head: Normocephalic.  Eyes: EOM are normal.  Neck: Normal range of motion.  Pulmonary/Chest: Effort normal.  Abdominal: Soft. She exhibits no distension. There is no tenderness.  Musculoskeletal: Normal range of motion.  Neurological: She is alert and oriented to person, place, and time.  Psychiatric: She has a normal mood and affect.  Nursing note and vitals reviewed.   ED Course  Procedures (including critical care time) DIAGNOSTIC STUDIES: Oxygen Saturation is 99% on RA, normal by my interpretation.    COORDINATION OF CARE: 2:36 AM-Discussed treatment plan which includes CBC, urinalysis, CMP, and zofran with pt at bedside and pt agreed to plan.     Labs Review Labs Reviewed  COMPREHENSIVE METABOLIC PANEL - Abnormal; Notable for the following:    Chloride 88 (*)    Glucose, Bld 125 (*)    BUN 39 (*)    Creatinine, Ser 7.93 (*)    Calcium 10.5 (*)    Total Protein 9.2 (*)     GFR calc non Af Amer 5 (*)    GFR calc Af Amer 6 (*)    Anion gap 20 (*)    All other components within normal limits  CBC - Abnormal; Notable for the following:    RDW 18.1 (*)    All other components within normal limits  LIPASE, BLOOD  URINALYSIS, ROUTINE W REFLEX MICROSCOPIC (NOT AT [facility])  SAMPLE TO BLOOD BANK    Imaging Review No results found. I have personally reviewed and evaluated these lab results as part of my medical decision-making.   EKG Interpretation None  MDM   Final diagnoses:  Nausea vomiting and diarrhea    Overall well-appearing.  Patient is feeling better at this time.  She did have one more episode of vomiting but reports Zofran significantly helps.  Her abdomen is benign.  She is scheduled for dialysis in the morning.  She will inform them of her GI illness that they can adjust her fluids appropriately.  Labs without significant abnormality.  BUN and creatinine as expected for someone prior to dialysis.  Nontoxic.   I personally performed the services described in this documentation, which was scribed in my presence. The recorded information has been reviewed and is accurate.         Jola Schmidt, MD 07/13/15 [phone]

## 2015-07-13 NOTE — Discharge Instructions (Signed)

## 2015-07-15 DIAGNOSIS — N2581 Secondary hyperparathyroidism of renal origin: Secondary | ICD-10-CM | POA: Diagnosis not present

## 2015-07-15 DIAGNOSIS — N186 End stage renal disease: Secondary | ICD-10-CM | POA: Diagnosis not present

## 2015-07-15 DIAGNOSIS — M3210 Systemic lupus erythematosus, organ or system involvement unspecified: Secondary | ICD-10-CM | POA: Diagnosis not present

## 2015-07-15 DIAGNOSIS — D509 Iron deficiency anemia, unspecified: Secondary | ICD-10-CM | POA: Diagnosis not present

## 2015-07-18 DIAGNOSIS — N186 End stage renal disease: Secondary | ICD-10-CM | POA: Diagnosis not present

## 2015-07-18 DIAGNOSIS — M3210 Systemic lupus erythematosus, organ or system involvement unspecified: Secondary | ICD-10-CM | POA: Diagnosis not present

## 2015-07-18 DIAGNOSIS — D509 Iron deficiency anemia, unspecified: Secondary | ICD-10-CM | POA: Diagnosis not present

## 2015-07-18 DIAGNOSIS — N2581 Secondary hyperparathyroidism of renal origin: Secondary | ICD-10-CM | POA: Diagnosis not present

## 2015-07-19 DIAGNOSIS — N186 End stage renal disease: Secondary | ICD-10-CM | POA: Diagnosis not present

## 2015-07-19 DIAGNOSIS — I120 Hypertensive chronic kidney disease with stage 5 chronic kidney disease or end stage renal disease: Secondary | ICD-10-CM | POA: Diagnosis not present

## 2015-07-19 DIAGNOSIS — Z992 Dependence on renal dialysis: Secondary | ICD-10-CM | POA: Diagnosis not present

## 2015-07-20 DIAGNOSIS — N186 End stage renal disease: Secondary | ICD-10-CM | POA: Diagnosis not present

## 2015-07-20 DIAGNOSIS — D509 Iron deficiency anemia, unspecified: Secondary | ICD-10-CM | POA: Diagnosis not present

## 2015-07-20 DIAGNOSIS — D631 Anemia in chronic kidney disease: Secondary | ICD-10-CM | POA: Diagnosis not present

## 2015-07-20 DIAGNOSIS — M3210 Systemic lupus erythematosus, organ or system involvement unspecified: Secondary | ICD-10-CM | POA: Diagnosis not present

## 2015-07-20 DIAGNOSIS — N2581 Secondary hyperparathyroidism of renal origin: Secondary | ICD-10-CM | POA: Diagnosis not present

## 2015-07-22 DIAGNOSIS — M3210 Systemic lupus erythematosus, organ or system involvement unspecified: Secondary | ICD-10-CM | POA: Diagnosis not present

## 2015-07-22 DIAGNOSIS — D509 Iron deficiency anemia, unspecified: Secondary | ICD-10-CM | POA: Diagnosis not present

## 2015-07-22 DIAGNOSIS — N2581 Secondary hyperparathyroidism of renal origin: Secondary | ICD-10-CM | POA: Diagnosis not present

## 2015-07-22 DIAGNOSIS — D631 Anemia in chronic kidney disease: Secondary | ICD-10-CM | POA: Diagnosis not present

## 2015-07-22 DIAGNOSIS — N186 End stage renal disease: Secondary | ICD-10-CM | POA: Diagnosis not present

## 2015-07-25 DIAGNOSIS — M3210 Systemic lupus erythematosus, organ or system involvement unspecified: Secondary | ICD-10-CM | POA: Diagnosis not present

## 2015-07-25 DIAGNOSIS — N186 End stage renal disease: Secondary | ICD-10-CM | POA: Diagnosis not present

## 2015-07-25 DIAGNOSIS — D509 Iron deficiency anemia, unspecified: Secondary | ICD-10-CM | POA: Diagnosis not present

## 2015-07-25 DIAGNOSIS — N2581 Secondary hyperparathyroidism of renal origin: Secondary | ICD-10-CM | POA: Diagnosis not present

## 2015-07-25 DIAGNOSIS — D631 Anemia in chronic kidney disease: Secondary | ICD-10-CM | POA: Diagnosis not present

## 2015-07-27 DIAGNOSIS — M3210 Systemic lupus erythematosus, organ or system involvement unspecified: Secondary | ICD-10-CM | POA: Diagnosis not present

## 2015-07-27 DIAGNOSIS — N2581 Secondary hyperparathyroidism of renal origin: Secondary | ICD-10-CM | POA: Diagnosis not present

## 2015-07-27 DIAGNOSIS — D509 Iron deficiency anemia, unspecified: Secondary | ICD-10-CM | POA: Diagnosis not present

## 2015-07-27 DIAGNOSIS — N186 End stage renal disease: Secondary | ICD-10-CM | POA: Diagnosis not present

## 2015-07-27 DIAGNOSIS — D631 Anemia in chronic kidney disease: Secondary | ICD-10-CM | POA: Diagnosis not present

## 2015-07-28 DIAGNOSIS — M19141 Post-traumatic osteoarthritis, right hand: Secondary | ICD-10-CM | POA: Insufficient documentation

## 2015-07-28 DIAGNOSIS — M79642 Pain in left hand: Secondary | ICD-10-CM | POA: Insufficient documentation

## 2015-07-28 DIAGNOSIS — M65312 Trigger thumb, left thumb: Secondary | ICD-10-CM | POA: Diagnosis not present

## 2015-07-28 DIAGNOSIS — M19041 Primary osteoarthritis, right hand: Secondary | ICD-10-CM | POA: Diagnosis not present

## 2015-07-28 DIAGNOSIS — M1852 Other unilateral secondary osteoarthritis of first carpometacarpal joint, left hand: Secondary | ICD-10-CM | POA: Diagnosis not present

## 2015-07-29 DIAGNOSIS — D509 Iron deficiency anemia, unspecified: Secondary | ICD-10-CM | POA: Diagnosis not present

## 2015-07-29 DIAGNOSIS — N186 End stage renal disease: Secondary | ICD-10-CM | POA: Diagnosis not present

## 2015-07-29 DIAGNOSIS — D631 Anemia in chronic kidney disease: Secondary | ICD-10-CM | POA: Diagnosis not present

## 2015-07-29 DIAGNOSIS — N2581 Secondary hyperparathyroidism of renal origin: Secondary | ICD-10-CM | POA: Diagnosis not present

## 2015-07-29 DIAGNOSIS — M3210 Systemic lupus erythematosus, organ or system involvement unspecified: Secondary | ICD-10-CM | POA: Diagnosis not present

## 2015-08-01 DIAGNOSIS — N2581 Secondary hyperparathyroidism of renal origin: Secondary | ICD-10-CM | POA: Diagnosis not present

## 2015-08-01 DIAGNOSIS — D631 Anemia in chronic kidney disease: Secondary | ICD-10-CM | POA: Diagnosis not present

## 2015-08-01 DIAGNOSIS — M3210 Systemic lupus erythematosus, organ or system involvement unspecified: Secondary | ICD-10-CM | POA: Diagnosis not present

## 2015-08-01 DIAGNOSIS — N186 End stage renal disease: Secondary | ICD-10-CM | POA: Diagnosis not present

## 2015-08-01 DIAGNOSIS — D509 Iron deficiency anemia, unspecified: Secondary | ICD-10-CM | POA: Diagnosis not present

## 2015-08-02 ENCOUNTER — Telehealth: Payer: Self-pay | Admitting: Interventional Cardiology

## 2015-08-02 NOTE — Telephone Encounter (Signed)
New Message:  She wants to know if you received a fax for clearance for surgery? She faxed it on 07-29-15.She needs to know today about stopping her aspirin.

## 2015-08-02 NOTE — Telephone Encounter (Signed)
OK to hold aspirin  Resume when safe after

## 2015-08-02 NOTE — Telephone Encounter (Signed)
This is not Dr.Smith's pt. Her cardiologist is Dr.Ross

## 2015-08-02 NOTE — Telephone Encounter (Signed)
Requesting surgical clearance:   1. Type of surgery: Release A-1 pulley left thumb; lt thumb cmc injections; right mcp joint injections (general anesthesia)  2. Surgeon: Charlotte Crumb  3. Surgical date: 08/04/15  4. Medications that need to be held: ASA (stopped 2 days before)   [facility]: Cyndee Brightly, RN  Fax: [phone] Phone: [phone]

## 2015-08-03 DIAGNOSIS — M3210 Systemic lupus erythematosus, organ or system involvement unspecified: Secondary | ICD-10-CM | POA: Diagnosis not present

## 2015-08-03 DIAGNOSIS — N2581 Secondary hyperparathyroidism of renal origin: Secondary | ICD-10-CM | POA: Diagnosis not present

## 2015-08-03 DIAGNOSIS — D631 Anemia in chronic kidney disease: Secondary | ICD-10-CM | POA: Diagnosis not present

## 2015-08-03 DIAGNOSIS — D509 Iron deficiency anemia, unspecified: Secondary | ICD-10-CM | POA: Diagnosis not present

## 2015-08-03 DIAGNOSIS — N186 End stage renal disease: Secondary | ICD-10-CM | POA: Diagnosis not present

## 2015-08-03 NOTE — Telephone Encounter (Signed)
Follow up      Need to know ASAP if pt can stop aspirin for surgery scheduled tomorrow.  Please call today

## 2015-08-03 NOTE — Telephone Encounter (Signed)
Called office, was informed [name] was with a patient and was given her voice mail. Left detailed message that per Dr. Harrington Challenger it is OK to hold aspirin and to resume when safe after.  Also called patient. Left a detailed voice mail message stating same with call back number in case she has questions.

## 2015-08-03 NOTE — Telephone Encounter (Signed)
Also routed this note to Dr. Burney Gauze through [facility]

## 2015-08-05 DIAGNOSIS — N2581 Secondary hyperparathyroidism of renal origin: Secondary | ICD-10-CM | POA: Diagnosis not present

## 2015-08-05 DIAGNOSIS — D631 Anemia in chronic kidney disease: Secondary | ICD-10-CM | POA: Diagnosis not present

## 2015-08-05 DIAGNOSIS — M3210 Systemic lupus erythematosus, organ or system involvement unspecified: Secondary | ICD-10-CM | POA: Diagnosis not present

## 2015-08-05 DIAGNOSIS — D509 Iron deficiency anemia, unspecified: Secondary | ICD-10-CM | POA: Diagnosis not present

## 2015-08-05 DIAGNOSIS — N186 End stage renal disease: Secondary | ICD-10-CM | POA: Diagnosis not present

## 2015-08-08 DIAGNOSIS — D631 Anemia in chronic kidney disease: Secondary | ICD-10-CM | POA: Diagnosis not present

## 2015-08-08 DIAGNOSIS — M3210 Systemic lupus erythematosus, organ or system involvement unspecified: Secondary | ICD-10-CM | POA: Diagnosis not present

## 2015-08-08 DIAGNOSIS — D509 Iron deficiency anemia, unspecified: Secondary | ICD-10-CM | POA: Diagnosis not present

## 2015-08-08 DIAGNOSIS — N186 End stage renal disease: Secondary | ICD-10-CM | POA: Diagnosis not present

## 2015-08-08 DIAGNOSIS — N2581 Secondary hyperparathyroidism of renal origin: Secondary | ICD-10-CM | POA: Diagnosis not present

## 2015-08-10 DIAGNOSIS — M3210 Systemic lupus erythematosus, organ or system involvement unspecified: Secondary | ICD-10-CM | POA: Diagnosis not present

## 2015-08-10 DIAGNOSIS — N186 End stage renal disease: Secondary | ICD-10-CM | POA: Diagnosis not present

## 2015-08-10 DIAGNOSIS — D631 Anemia in chronic kidney disease: Secondary | ICD-10-CM | POA: Diagnosis not present

## 2015-08-10 DIAGNOSIS — N2581 Secondary hyperparathyroidism of renal origin: Secondary | ICD-10-CM | POA: Diagnosis not present

## 2015-08-10 DIAGNOSIS — D509 Iron deficiency anemia, unspecified: Secondary | ICD-10-CM | POA: Diagnosis not present

## 2015-08-12 DIAGNOSIS — N186 End stage renal disease: Secondary | ICD-10-CM | POA: Diagnosis not present

## 2015-08-12 DIAGNOSIS — M3210 Systemic lupus erythematosus, organ or system involvement unspecified: Secondary | ICD-10-CM | POA: Diagnosis not present

## 2015-08-12 DIAGNOSIS — D631 Anemia in chronic kidney disease: Secondary | ICD-10-CM | POA: Diagnosis not present

## 2015-08-12 DIAGNOSIS — N2581 Secondary hyperparathyroidism of renal origin: Secondary | ICD-10-CM | POA: Diagnosis not present

## 2015-08-12 DIAGNOSIS — D509 Iron deficiency anemia, unspecified: Secondary | ICD-10-CM | POA: Diagnosis not present

## 2015-08-15 DIAGNOSIS — D631 Anemia in chronic kidney disease: Secondary | ICD-10-CM | POA: Diagnosis not present

## 2015-08-15 DIAGNOSIS — N186 End stage renal disease: Secondary | ICD-10-CM | POA: Diagnosis not present

## 2015-08-15 DIAGNOSIS — M3210 Systemic lupus erythematosus, organ or system involvement unspecified: Secondary | ICD-10-CM | POA: Diagnosis not present

## 2015-08-15 DIAGNOSIS — N2581 Secondary hyperparathyroidism of renal origin: Secondary | ICD-10-CM | POA: Diagnosis not present

## 2015-08-15 DIAGNOSIS — D509 Iron deficiency anemia, unspecified: Secondary | ICD-10-CM | POA: Diagnosis not present

## 2015-08-17 DIAGNOSIS — D509 Iron deficiency anemia, unspecified: Secondary | ICD-10-CM | POA: Diagnosis not present

## 2015-08-17 DIAGNOSIS — N186 End stage renal disease: Secondary | ICD-10-CM | POA: Diagnosis not present

## 2015-08-17 DIAGNOSIS — M3210 Systemic lupus erythematosus, organ or system involvement unspecified: Secondary | ICD-10-CM | POA: Diagnosis not present

## 2015-08-17 DIAGNOSIS — D631 Anemia in chronic kidney disease: Secondary | ICD-10-CM | POA: Diagnosis not present

## 2015-08-17 DIAGNOSIS — N2581 Secondary hyperparathyroidism of renal origin: Secondary | ICD-10-CM | POA: Diagnosis not present

## 2015-08-19 DIAGNOSIS — D631 Anemia in chronic kidney disease: Secondary | ICD-10-CM | POA: Diagnosis not present

## 2015-08-19 DIAGNOSIS — D509 Iron deficiency anemia, unspecified: Secondary | ICD-10-CM | POA: Diagnosis not present

## 2015-08-19 DIAGNOSIS — M3210 Systemic lupus erythematosus, organ or system involvement unspecified: Secondary | ICD-10-CM | POA: Diagnosis not present

## 2015-08-19 DIAGNOSIS — I120 Hypertensive chronic kidney disease with stage 5 chronic kidney disease or end stage renal disease: Secondary | ICD-10-CM | POA: Diagnosis not present

## 2015-08-19 DIAGNOSIS — Z992 Dependence on renal dialysis: Secondary | ICD-10-CM | POA: Diagnosis not present

## 2015-08-19 DIAGNOSIS — N186 End stage renal disease: Secondary | ICD-10-CM | POA: Diagnosis not present

## 2015-08-19 DIAGNOSIS — N2581 Secondary hyperparathyroidism of renal origin: Secondary | ICD-10-CM | POA: Diagnosis not present

## 2015-08-22 DIAGNOSIS — D631 Anemia in chronic kidney disease: Secondary | ICD-10-CM | POA: Diagnosis not present

## 2015-08-22 DIAGNOSIS — N2581 Secondary hyperparathyroidism of renal origin: Secondary | ICD-10-CM | POA: Diagnosis not present

## 2015-08-22 DIAGNOSIS — D509 Iron deficiency anemia, unspecified: Secondary | ICD-10-CM | POA: Diagnosis not present

## 2015-08-22 DIAGNOSIS — M3210 Systemic lupus erythematosus, organ or system involvement unspecified: Secondary | ICD-10-CM | POA: Diagnosis not present

## 2015-08-22 DIAGNOSIS — N186 End stage renal disease: Secondary | ICD-10-CM | POA: Diagnosis not present

## 2015-08-24 DIAGNOSIS — N2581 Secondary hyperparathyroidism of renal origin: Secondary | ICD-10-CM | POA: Diagnosis not present

## 2015-08-24 DIAGNOSIS — D631 Anemia in chronic kidney disease: Secondary | ICD-10-CM | POA: Diagnosis not present

## 2015-08-24 DIAGNOSIS — N186 End stage renal disease: Secondary | ICD-10-CM | POA: Diagnosis not present

## 2015-08-24 DIAGNOSIS — M3210 Systemic lupus erythematosus, organ or system involvement unspecified: Secondary | ICD-10-CM | POA: Diagnosis not present

## 2015-08-24 DIAGNOSIS — D509 Iron deficiency anemia, unspecified: Secondary | ICD-10-CM | POA: Diagnosis not present

## 2015-08-26 DIAGNOSIS — N2581 Secondary hyperparathyroidism of renal origin: Secondary | ICD-10-CM | POA: Diagnosis not present

## 2015-08-26 DIAGNOSIS — D509 Iron deficiency anemia, unspecified: Secondary | ICD-10-CM | POA: Diagnosis not present

## 2015-08-26 DIAGNOSIS — D631 Anemia in chronic kidney disease: Secondary | ICD-10-CM | POA: Diagnosis not present

## 2015-08-26 DIAGNOSIS — M3210 Systemic lupus erythematosus, organ or system involvement unspecified: Secondary | ICD-10-CM | POA: Diagnosis not present

## 2015-08-26 DIAGNOSIS — N186 End stage renal disease: Secondary | ICD-10-CM | POA: Diagnosis not present

## 2015-08-29 DIAGNOSIS — N186 End stage renal disease: Secondary | ICD-10-CM | POA: Diagnosis not present

## 2015-08-29 DIAGNOSIS — N2581 Secondary hyperparathyroidism of renal origin: Secondary | ICD-10-CM | POA: Diagnosis not present

## 2015-08-29 DIAGNOSIS — D631 Anemia in chronic kidney disease: Secondary | ICD-10-CM | POA: Diagnosis not present

## 2015-08-29 DIAGNOSIS — D509 Iron deficiency anemia, unspecified: Secondary | ICD-10-CM | POA: Diagnosis not present

## 2015-08-29 DIAGNOSIS — M3210 Systemic lupus erythematosus, organ or system involvement unspecified: Secondary | ICD-10-CM | POA: Diagnosis not present

## 2015-08-31 DIAGNOSIS — N2581 Secondary hyperparathyroidism of renal origin: Secondary | ICD-10-CM | POA: Diagnosis not present

## 2015-08-31 DIAGNOSIS — D631 Anemia in chronic kidney disease: Secondary | ICD-10-CM | POA: Diagnosis not present

## 2015-08-31 DIAGNOSIS — M3210 Systemic lupus erythematosus, organ or system involvement unspecified: Secondary | ICD-10-CM | POA: Diagnosis not present

## 2015-08-31 DIAGNOSIS — N186 End stage renal disease: Secondary | ICD-10-CM | POA: Diagnosis not present

## 2015-08-31 DIAGNOSIS — D509 Iron deficiency anemia, unspecified: Secondary | ICD-10-CM | POA: Diagnosis not present

## 2015-09-02 DIAGNOSIS — M3210 Systemic lupus erythematosus, organ or system involvement unspecified: Secondary | ICD-10-CM | POA: Diagnosis not present

## 2015-09-02 DIAGNOSIS — D631 Anemia in chronic kidney disease: Secondary | ICD-10-CM | POA: Diagnosis not present

## 2015-09-02 DIAGNOSIS — D509 Iron deficiency anemia, unspecified: Secondary | ICD-10-CM | POA: Diagnosis not present

## 2015-09-02 DIAGNOSIS — N2581 Secondary hyperparathyroidism of renal origin: Secondary | ICD-10-CM | POA: Diagnosis not present

## 2015-09-02 DIAGNOSIS — N186 End stage renal disease: Secondary | ICD-10-CM | POA: Diagnosis not present

## 2015-09-05 DIAGNOSIS — M3210 Systemic lupus erythematosus, organ or system involvement unspecified: Secondary | ICD-10-CM | POA: Diagnosis not present

## 2015-09-05 DIAGNOSIS — N186 End stage renal disease: Secondary | ICD-10-CM | POA: Diagnosis not present

## 2015-09-05 DIAGNOSIS — D509 Iron deficiency anemia, unspecified: Secondary | ICD-10-CM | POA: Diagnosis not present

## 2015-09-05 DIAGNOSIS — N2581 Secondary hyperparathyroidism of renal origin: Secondary | ICD-10-CM | POA: Diagnosis not present

## 2015-09-05 DIAGNOSIS — D631 Anemia in chronic kidney disease: Secondary | ICD-10-CM | POA: Diagnosis not present

## 2015-09-07 DIAGNOSIS — M3210 Systemic lupus erythematosus, organ or system involvement unspecified: Secondary | ICD-10-CM | POA: Diagnosis not present

## 2015-09-07 DIAGNOSIS — N186 End stage renal disease: Secondary | ICD-10-CM | POA: Diagnosis not present

## 2015-09-07 DIAGNOSIS — N2581 Secondary hyperparathyroidism of renal origin: Secondary | ICD-10-CM | POA: Diagnosis not present

## 2015-09-07 DIAGNOSIS — D509 Iron deficiency anemia, unspecified: Secondary | ICD-10-CM | POA: Diagnosis not present

## 2015-09-07 DIAGNOSIS — D631 Anemia in chronic kidney disease: Secondary | ICD-10-CM | POA: Diagnosis not present

## 2015-09-09 ENCOUNTER — Ambulatory Visit: Payer: Medicare Other | Admitting: Internal Medicine

## 2015-09-09 DIAGNOSIS — D631 Anemia in chronic kidney disease: Secondary | ICD-10-CM | POA: Diagnosis not present

## 2015-09-09 DIAGNOSIS — N186 End stage renal disease: Secondary | ICD-10-CM | POA: Diagnosis not present

## 2015-09-09 DIAGNOSIS — N2581 Secondary hyperparathyroidism of renal origin: Secondary | ICD-10-CM | POA: Diagnosis not present

## 2015-09-09 DIAGNOSIS — M3210 Systemic lupus erythematosus, organ or system involvement unspecified: Secondary | ICD-10-CM | POA: Diagnosis not present

## 2015-09-09 DIAGNOSIS — D509 Iron deficiency anemia, unspecified: Secondary | ICD-10-CM | POA: Diagnosis not present

## 2015-09-12 DIAGNOSIS — N186 End stage renal disease: Secondary | ICD-10-CM | POA: Diagnosis not present

## 2015-09-12 DIAGNOSIS — N2581 Secondary hyperparathyroidism of renal origin: Secondary | ICD-10-CM | POA: Diagnosis not present

## 2015-09-12 DIAGNOSIS — D509 Iron deficiency anemia, unspecified: Secondary | ICD-10-CM | POA: Diagnosis not present

## 2015-09-12 DIAGNOSIS — D631 Anemia in chronic kidney disease: Secondary | ICD-10-CM | POA: Diagnosis not present

## 2015-09-12 DIAGNOSIS — M3210 Systemic lupus erythematosus, organ or system involvement unspecified: Secondary | ICD-10-CM | POA: Diagnosis not present

## 2015-09-14 DIAGNOSIS — D509 Iron deficiency anemia, unspecified: Secondary | ICD-10-CM | POA: Diagnosis not present

## 2015-09-14 DIAGNOSIS — D631 Anemia in chronic kidney disease: Secondary | ICD-10-CM | POA: Diagnosis not present

## 2015-09-14 DIAGNOSIS — N186 End stage renal disease: Secondary | ICD-10-CM | POA: Diagnosis not present

## 2015-09-14 DIAGNOSIS — N2581 Secondary hyperparathyroidism of renal origin: Secondary | ICD-10-CM | POA: Diagnosis not present

## 2015-09-14 DIAGNOSIS — M3210 Systemic lupus erythematosus, organ or system involvement unspecified: Secondary | ICD-10-CM | POA: Diagnosis not present

## 2015-09-16 DIAGNOSIS — N2581 Secondary hyperparathyroidism of renal origin: Secondary | ICD-10-CM | POA: Diagnosis not present

## 2015-09-16 DIAGNOSIS — D509 Iron deficiency anemia, unspecified: Secondary | ICD-10-CM | POA: Diagnosis not present

## 2015-09-16 DIAGNOSIS — N186 End stage renal disease: Secondary | ICD-10-CM | POA: Diagnosis not present

## 2015-09-16 DIAGNOSIS — M3210 Systemic lupus erythematosus, organ or system involvement unspecified: Secondary | ICD-10-CM | POA: Diagnosis not present

## 2015-09-16 DIAGNOSIS — D631 Anemia in chronic kidney disease: Secondary | ICD-10-CM | POA: Diagnosis not present

## 2015-09-18 DIAGNOSIS — I120 Hypertensive chronic kidney disease with stage 5 chronic kidney disease or end stage renal disease: Secondary | ICD-10-CM | POA: Diagnosis not present

## 2015-09-18 DIAGNOSIS — N186 End stage renal disease: Secondary | ICD-10-CM | POA: Diagnosis not present

## 2015-09-18 DIAGNOSIS — Z992 Dependence on renal dialysis: Secondary | ICD-10-CM | POA: Diagnosis not present

## 2015-09-19 DIAGNOSIS — M3210 Systemic lupus erythematosus, organ or system involvement unspecified: Secondary | ICD-10-CM | POA: Diagnosis not present

## 2015-09-19 DIAGNOSIS — N186 End stage renal disease: Secondary | ICD-10-CM | POA: Diagnosis not present

## 2015-09-19 DIAGNOSIS — D631 Anemia in chronic kidney disease: Secondary | ICD-10-CM | POA: Diagnosis not present

## 2015-09-19 DIAGNOSIS — D509 Iron deficiency anemia, unspecified: Secondary | ICD-10-CM | POA: Diagnosis not present

## 2015-09-19 DIAGNOSIS — N2581 Secondary hyperparathyroidism of renal origin: Secondary | ICD-10-CM | POA: Diagnosis not present

## 2015-09-21 DIAGNOSIS — D509 Iron deficiency anemia, unspecified: Secondary | ICD-10-CM | POA: Diagnosis not present

## 2015-09-21 DIAGNOSIS — D631 Anemia in chronic kidney disease: Secondary | ICD-10-CM | POA: Diagnosis not present

## 2015-09-21 DIAGNOSIS — N186 End stage renal disease: Secondary | ICD-10-CM | POA: Diagnosis not present

## 2015-09-21 DIAGNOSIS — N2581 Secondary hyperparathyroidism of renal origin: Secondary | ICD-10-CM | POA: Diagnosis not present

## 2015-09-21 DIAGNOSIS — M3210 Systemic lupus erythematosus, organ or system involvement unspecified: Secondary | ICD-10-CM | POA: Diagnosis not present

## 2015-09-23 DIAGNOSIS — N2581 Secondary hyperparathyroidism of renal origin: Secondary | ICD-10-CM | POA: Diagnosis not present

## 2015-09-23 DIAGNOSIS — D631 Anemia in chronic kidney disease: Secondary | ICD-10-CM | POA: Diagnosis not present

## 2015-09-23 DIAGNOSIS — N186 End stage renal disease: Secondary | ICD-10-CM | POA: Diagnosis not present

## 2015-09-23 DIAGNOSIS — D509 Iron deficiency anemia, unspecified: Secondary | ICD-10-CM | POA: Diagnosis not present

## 2015-09-23 DIAGNOSIS — M3210 Systemic lupus erythematosus, organ or system involvement unspecified: Secondary | ICD-10-CM | POA: Diagnosis not present

## 2015-09-26 DIAGNOSIS — N2581 Secondary hyperparathyroidism of renal origin: Secondary | ICD-10-CM | POA: Diagnosis not present

## 2015-09-26 DIAGNOSIS — N186 End stage renal disease: Secondary | ICD-10-CM | POA: Diagnosis not present

## 2015-09-26 DIAGNOSIS — D631 Anemia in chronic kidney disease: Secondary | ICD-10-CM | POA: Diagnosis not present

## 2015-09-26 DIAGNOSIS — M3210 Systemic lupus erythematosus, organ or system involvement unspecified: Secondary | ICD-10-CM | POA: Diagnosis not present

## 2015-09-26 DIAGNOSIS — D509 Iron deficiency anemia, unspecified: Secondary | ICD-10-CM | POA: Diagnosis not present

## 2015-09-28 DIAGNOSIS — D631 Anemia in chronic kidney disease: Secondary | ICD-10-CM | POA: Diagnosis not present

## 2015-09-28 DIAGNOSIS — D509 Iron deficiency anemia, unspecified: Secondary | ICD-10-CM | POA: Diagnosis not present

## 2015-09-28 DIAGNOSIS — M3210 Systemic lupus erythematosus, organ or system involvement unspecified: Secondary | ICD-10-CM | POA: Diagnosis not present

## 2015-09-28 DIAGNOSIS — N2581 Secondary hyperparathyroidism of renal origin: Secondary | ICD-10-CM | POA: Diagnosis not present

## 2015-09-28 DIAGNOSIS — N186 End stage renal disease: Secondary | ICD-10-CM | POA: Diagnosis not present

## 2015-09-30 DIAGNOSIS — N2581 Secondary hyperparathyroidism of renal origin: Secondary | ICD-10-CM | POA: Diagnosis not present

## 2015-09-30 DIAGNOSIS — N186 End stage renal disease: Secondary | ICD-10-CM | POA: Diagnosis not present

## 2015-09-30 DIAGNOSIS — D509 Iron deficiency anemia, unspecified: Secondary | ICD-10-CM | POA: Diagnosis not present

## 2015-09-30 DIAGNOSIS — M3210 Systemic lupus erythematosus, organ or system involvement unspecified: Secondary | ICD-10-CM | POA: Diagnosis not present

## 2015-09-30 DIAGNOSIS — D631 Anemia in chronic kidney disease: Secondary | ICD-10-CM | POA: Diagnosis not present

## 2015-10-03 DIAGNOSIS — M3210 Systemic lupus erythematosus, organ or system involvement unspecified: Secondary | ICD-10-CM | POA: Diagnosis not present

## 2015-10-03 DIAGNOSIS — D631 Anemia in chronic kidney disease: Secondary | ICD-10-CM | POA: Diagnosis not present

## 2015-10-03 DIAGNOSIS — D509 Iron deficiency anemia, unspecified: Secondary | ICD-10-CM | POA: Diagnosis not present

## 2015-10-03 DIAGNOSIS — N2581 Secondary hyperparathyroidism of renal origin: Secondary | ICD-10-CM | POA: Diagnosis not present

## 2015-10-03 DIAGNOSIS — N186 End stage renal disease: Secondary | ICD-10-CM | POA: Diagnosis not present

## 2015-10-05 DIAGNOSIS — N2581 Secondary hyperparathyroidism of renal origin: Secondary | ICD-10-CM | POA: Diagnosis not present

## 2015-10-05 DIAGNOSIS — D509 Iron deficiency anemia, unspecified: Secondary | ICD-10-CM | POA: Diagnosis not present

## 2015-10-05 DIAGNOSIS — D631 Anemia in chronic kidney disease: Secondary | ICD-10-CM | POA: Diagnosis not present

## 2015-10-05 DIAGNOSIS — N186 End stage renal disease: Secondary | ICD-10-CM | POA: Diagnosis not present

## 2015-10-05 DIAGNOSIS — M3210 Systemic lupus erythematosus, organ or system involvement unspecified: Secondary | ICD-10-CM | POA: Diagnosis not present

## 2015-10-07 DIAGNOSIS — N186 End stage renal disease: Secondary | ICD-10-CM | POA: Diagnosis not present

## 2015-10-07 DIAGNOSIS — M3210 Systemic lupus erythematosus, organ or system involvement unspecified: Secondary | ICD-10-CM | POA: Diagnosis not present

## 2015-10-07 DIAGNOSIS — N2581 Secondary hyperparathyroidism of renal origin: Secondary | ICD-10-CM | POA: Diagnosis not present

## 2015-10-07 DIAGNOSIS — D509 Iron deficiency anemia, unspecified: Secondary | ICD-10-CM | POA: Diagnosis not present

## 2015-10-07 DIAGNOSIS — D631 Anemia in chronic kidney disease: Secondary | ICD-10-CM | POA: Diagnosis not present

## 2015-10-10 DIAGNOSIS — D631 Anemia in chronic kidney disease: Secondary | ICD-10-CM | POA: Diagnosis not present

## 2015-10-10 DIAGNOSIS — N2581 Secondary hyperparathyroidism of renal origin: Secondary | ICD-10-CM | POA: Diagnosis not present

## 2015-10-10 DIAGNOSIS — M3210 Systemic lupus erythematosus, organ or system involvement unspecified: Secondary | ICD-10-CM | POA: Diagnosis not present

## 2015-10-10 DIAGNOSIS — N186 End stage renal disease: Secondary | ICD-10-CM | POA: Diagnosis not present

## 2015-10-10 DIAGNOSIS — D509 Iron deficiency anemia, unspecified: Secondary | ICD-10-CM | POA: Diagnosis not present

## 2015-10-12 DIAGNOSIS — N2581 Secondary hyperparathyroidism of renal origin: Secondary | ICD-10-CM | POA: Diagnosis not present

## 2015-10-12 DIAGNOSIS — D509 Iron deficiency anemia, unspecified: Secondary | ICD-10-CM | POA: Diagnosis not present

## 2015-10-12 DIAGNOSIS — N186 End stage renal disease: Secondary | ICD-10-CM | POA: Diagnosis not present

## 2015-10-12 DIAGNOSIS — M3210 Systemic lupus erythematosus, organ or system involvement unspecified: Secondary | ICD-10-CM | POA: Diagnosis not present

## 2015-10-12 DIAGNOSIS — D631 Anemia in chronic kidney disease: Secondary | ICD-10-CM | POA: Diagnosis not present

## 2015-10-14 DIAGNOSIS — M3210 Systemic lupus erythematosus, organ or system involvement unspecified: Secondary | ICD-10-CM | POA: Diagnosis not present

## 2015-10-14 DIAGNOSIS — N2581 Secondary hyperparathyroidism of renal origin: Secondary | ICD-10-CM | POA: Diagnosis not present

## 2015-10-14 DIAGNOSIS — D631 Anemia in chronic kidney disease: Secondary | ICD-10-CM | POA: Diagnosis not present

## 2015-10-14 DIAGNOSIS — D509 Iron deficiency anemia, unspecified: Secondary | ICD-10-CM | POA: Diagnosis not present

## 2015-10-14 DIAGNOSIS — N186 End stage renal disease: Secondary | ICD-10-CM | POA: Diagnosis not present

## 2015-10-17 DIAGNOSIS — N2581 Secondary hyperparathyroidism of renal origin: Secondary | ICD-10-CM | POA: Diagnosis not present

## 2015-10-17 DIAGNOSIS — N186 End stage renal disease: Secondary | ICD-10-CM | POA: Diagnosis not present

## 2015-10-17 DIAGNOSIS — D631 Anemia in chronic kidney disease: Secondary | ICD-10-CM | POA: Diagnosis not present

## 2015-10-17 DIAGNOSIS — D509 Iron deficiency anemia, unspecified: Secondary | ICD-10-CM | POA: Diagnosis not present

## 2015-10-17 DIAGNOSIS — M3210 Systemic lupus erythematosus, organ or system involvement unspecified: Secondary | ICD-10-CM | POA: Diagnosis not present

## 2015-10-19 DIAGNOSIS — D509 Iron deficiency anemia, unspecified: Secondary | ICD-10-CM | POA: Diagnosis not present

## 2015-10-19 DIAGNOSIS — D631 Anemia in chronic kidney disease: Secondary | ICD-10-CM | POA: Diagnosis not present

## 2015-10-19 DIAGNOSIS — M3210 Systemic lupus erythematosus, organ or system involvement unspecified: Secondary | ICD-10-CM | POA: Diagnosis not present

## 2015-10-19 DIAGNOSIS — I120 Hypertensive chronic kidney disease with stage 5 chronic kidney disease or end stage renal disease: Secondary | ICD-10-CM | POA: Diagnosis not present

## 2015-10-19 DIAGNOSIS — N186 End stage renal disease: Secondary | ICD-10-CM | POA: Diagnosis not present

## 2015-10-19 DIAGNOSIS — Z992 Dependence on renal dialysis: Secondary | ICD-10-CM | POA: Diagnosis not present

## 2015-10-19 DIAGNOSIS — N2581 Secondary hyperparathyroidism of renal origin: Secondary | ICD-10-CM | POA: Diagnosis not present

## 2015-10-22 DIAGNOSIS — N2581 Secondary hyperparathyroidism of renal origin: Secondary | ICD-10-CM | POA: Diagnosis not present

## 2015-10-22 DIAGNOSIS — D509 Iron deficiency anemia, unspecified: Secondary | ICD-10-CM | POA: Diagnosis not present

## 2015-10-22 DIAGNOSIS — N186 End stage renal disease: Secondary | ICD-10-CM | POA: Diagnosis not present

## 2015-10-22 DIAGNOSIS — M3210 Systemic lupus erythematosus, organ or system involvement unspecified: Secondary | ICD-10-CM | POA: Diagnosis not present

## 2015-10-22 DIAGNOSIS — D631 Anemia in chronic kidney disease: Secondary | ICD-10-CM | POA: Diagnosis not present

## 2015-10-24 DIAGNOSIS — D509 Iron deficiency anemia, unspecified: Secondary | ICD-10-CM | POA: Diagnosis not present

## 2015-10-24 DIAGNOSIS — M3210 Systemic lupus erythematosus, organ or system involvement unspecified: Secondary | ICD-10-CM | POA: Diagnosis not present

## 2015-10-24 DIAGNOSIS — N186 End stage renal disease: Secondary | ICD-10-CM | POA: Diagnosis not present

## 2015-10-24 DIAGNOSIS — D631 Anemia in chronic kidney disease: Secondary | ICD-10-CM | POA: Diagnosis not present

## 2015-10-24 DIAGNOSIS — N2581 Secondary hyperparathyroidism of renal origin: Secondary | ICD-10-CM | POA: Diagnosis not present

## 2015-10-26 DIAGNOSIS — N2581 Secondary hyperparathyroidism of renal origin: Secondary | ICD-10-CM | POA: Diagnosis not present

## 2015-10-26 DIAGNOSIS — D509 Iron deficiency anemia, unspecified: Secondary | ICD-10-CM | POA: Diagnosis not present

## 2015-10-26 DIAGNOSIS — N186 End stage renal disease: Secondary | ICD-10-CM | POA: Diagnosis not present

## 2015-10-26 DIAGNOSIS — D631 Anemia in chronic kidney disease: Secondary | ICD-10-CM | POA: Diagnosis not present

## 2015-10-26 DIAGNOSIS — M3210 Systemic lupus erythematosus, organ or system involvement unspecified: Secondary | ICD-10-CM | POA: Diagnosis not present

## 2015-10-28 DIAGNOSIS — D631 Anemia in chronic kidney disease: Secondary | ICD-10-CM | POA: Diagnosis not present

## 2015-10-28 DIAGNOSIS — N2581 Secondary hyperparathyroidism of renal origin: Secondary | ICD-10-CM | POA: Diagnosis not present

## 2015-10-28 DIAGNOSIS — N186 End stage renal disease: Secondary | ICD-10-CM | POA: Diagnosis not present

## 2015-10-28 DIAGNOSIS — M3210 Systemic lupus erythematosus, organ or system involvement unspecified: Secondary | ICD-10-CM | POA: Diagnosis not present

## 2015-10-28 DIAGNOSIS — D509 Iron deficiency anemia, unspecified: Secondary | ICD-10-CM | POA: Diagnosis not present

## 2015-10-31 DIAGNOSIS — M3210 Systemic lupus erythematosus, organ or system involvement unspecified: Secondary | ICD-10-CM | POA: Diagnosis not present

## 2015-10-31 DIAGNOSIS — N186 End stage renal disease: Secondary | ICD-10-CM | POA: Diagnosis not present

## 2015-10-31 DIAGNOSIS — D509 Iron deficiency anemia, unspecified: Secondary | ICD-10-CM | POA: Diagnosis not present

## 2015-10-31 DIAGNOSIS — D631 Anemia in chronic kidney disease: Secondary | ICD-10-CM | POA: Diagnosis not present

## 2015-10-31 DIAGNOSIS — N2581 Secondary hyperparathyroidism of renal origin: Secondary | ICD-10-CM | POA: Diagnosis not present

## 2015-11-04 DIAGNOSIS — D631 Anemia in chronic kidney disease: Secondary | ICD-10-CM | POA: Diagnosis not present

## 2015-11-04 DIAGNOSIS — M3210 Systemic lupus erythematosus, organ or system involvement unspecified: Secondary | ICD-10-CM | POA: Diagnosis not present

## 2015-11-04 DIAGNOSIS — N186 End stage renal disease: Secondary | ICD-10-CM | POA: Diagnosis not present

## 2015-11-04 DIAGNOSIS — D509 Iron deficiency anemia, unspecified: Secondary | ICD-10-CM | POA: Diagnosis not present

## 2015-11-04 DIAGNOSIS — N2581 Secondary hyperparathyroidism of renal origin: Secondary | ICD-10-CM | POA: Diagnosis not present

## 2015-11-07 DIAGNOSIS — D631 Anemia in chronic kidney disease: Secondary | ICD-10-CM | POA: Diagnosis not present

## 2015-11-07 DIAGNOSIS — N2581 Secondary hyperparathyroidism of renal origin: Secondary | ICD-10-CM | POA: Diagnosis not present

## 2015-11-07 DIAGNOSIS — M3210 Systemic lupus erythematosus, organ or system involvement unspecified: Secondary | ICD-10-CM | POA: Diagnosis not present

## 2015-11-07 DIAGNOSIS — D509 Iron deficiency anemia, unspecified: Secondary | ICD-10-CM | POA: Diagnosis not present

## 2015-11-07 DIAGNOSIS — N186 End stage renal disease: Secondary | ICD-10-CM | POA: Diagnosis not present

## 2015-11-09 DIAGNOSIS — M3210 Systemic lupus erythematosus, organ or system involvement unspecified: Secondary | ICD-10-CM | POA: Diagnosis not present

## 2015-11-09 DIAGNOSIS — N186 End stage renal disease: Secondary | ICD-10-CM | POA: Diagnosis not present

## 2015-11-09 DIAGNOSIS — N2581 Secondary hyperparathyroidism of renal origin: Secondary | ICD-10-CM | POA: Diagnosis not present

## 2015-11-09 DIAGNOSIS — D631 Anemia in chronic kidney disease: Secondary | ICD-10-CM | POA: Diagnosis not present

## 2015-11-09 DIAGNOSIS — D509 Iron deficiency anemia, unspecified: Secondary | ICD-10-CM | POA: Diagnosis not present

## 2015-11-11 DIAGNOSIS — N2581 Secondary hyperparathyroidism of renal origin: Secondary | ICD-10-CM | POA: Diagnosis not present

## 2015-11-11 DIAGNOSIS — D509 Iron deficiency anemia, unspecified: Secondary | ICD-10-CM | POA: Diagnosis not present

## 2015-11-11 DIAGNOSIS — N186 End stage renal disease: Secondary | ICD-10-CM | POA: Diagnosis not present

## 2015-11-11 DIAGNOSIS — D631 Anemia in chronic kidney disease: Secondary | ICD-10-CM | POA: Diagnosis not present

## 2015-11-11 DIAGNOSIS — M3210 Systemic lupus erythematosus, organ or system involvement unspecified: Secondary | ICD-10-CM | POA: Diagnosis not present

## 2015-11-14 DIAGNOSIS — D631 Anemia in chronic kidney disease: Secondary | ICD-10-CM | POA: Diagnosis not present

## 2015-11-14 DIAGNOSIS — M3210 Systemic lupus erythematosus, organ or system involvement unspecified: Secondary | ICD-10-CM | POA: Diagnosis not present

## 2015-11-14 DIAGNOSIS — D509 Iron deficiency anemia, unspecified: Secondary | ICD-10-CM | POA: Diagnosis not present

## 2015-11-14 DIAGNOSIS — N2581 Secondary hyperparathyroidism of renal origin: Secondary | ICD-10-CM | POA: Diagnosis not present

## 2015-11-14 DIAGNOSIS — N186 End stage renal disease: Secondary | ICD-10-CM | POA: Diagnosis not present

## 2015-11-16 DIAGNOSIS — D509 Iron deficiency anemia, unspecified: Secondary | ICD-10-CM | POA: Diagnosis not present

## 2015-11-16 DIAGNOSIS — N186 End stage renal disease: Secondary | ICD-10-CM | POA: Diagnosis not present

## 2015-11-16 DIAGNOSIS — D631 Anemia in chronic kidney disease: Secondary | ICD-10-CM | POA: Diagnosis not present

## 2015-11-16 DIAGNOSIS — M3210 Systemic lupus erythematosus, organ or system involvement unspecified: Secondary | ICD-10-CM | POA: Diagnosis not present

## 2015-11-16 DIAGNOSIS — N2581 Secondary hyperparathyroidism of renal origin: Secondary | ICD-10-CM | POA: Diagnosis not present

## 2015-11-18 DIAGNOSIS — D509 Iron deficiency anemia, unspecified: Secondary | ICD-10-CM | POA: Diagnosis not present

## 2015-11-18 DIAGNOSIS — I120 Hypertensive chronic kidney disease with stage 5 chronic kidney disease or end stage renal disease: Secondary | ICD-10-CM | POA: Diagnosis not present

## 2015-11-18 DIAGNOSIS — M3210 Systemic lupus erythematosus, organ or system involvement unspecified: Secondary | ICD-10-CM | POA: Diagnosis not present

## 2015-11-18 DIAGNOSIS — N2581 Secondary hyperparathyroidism of renal origin: Secondary | ICD-10-CM | POA: Diagnosis not present

## 2015-11-18 DIAGNOSIS — Z992 Dependence on renal dialysis: Secondary | ICD-10-CM | POA: Diagnosis not present

## 2015-11-18 DIAGNOSIS — D631 Anemia in chronic kidney disease: Secondary | ICD-10-CM | POA: Diagnosis not present

## 2015-11-18 DIAGNOSIS — N186 End stage renal disease: Secondary | ICD-10-CM | POA: Diagnosis not present

## 2015-11-21 DIAGNOSIS — D631 Anemia in chronic kidney disease: Secondary | ICD-10-CM | POA: Diagnosis not present

## 2015-11-21 DIAGNOSIS — M3210 Systemic lupus erythematosus, organ or system involvement unspecified: Secondary | ICD-10-CM | POA: Diagnosis not present

## 2015-11-21 DIAGNOSIS — N2581 Secondary hyperparathyroidism of renal origin: Secondary | ICD-10-CM | POA: Diagnosis not present

## 2015-11-21 DIAGNOSIS — D509 Iron deficiency anemia, unspecified: Secondary | ICD-10-CM | POA: Diagnosis not present

## 2015-11-21 DIAGNOSIS — N186 End stage renal disease: Secondary | ICD-10-CM | POA: Diagnosis not present

## 2015-11-23 DIAGNOSIS — N2581 Secondary hyperparathyroidism of renal origin: Secondary | ICD-10-CM | POA: Diagnosis not present

## 2015-11-23 DIAGNOSIS — D631 Anemia in chronic kidney disease: Secondary | ICD-10-CM | POA: Diagnosis not present

## 2015-11-23 DIAGNOSIS — D509 Iron deficiency anemia, unspecified: Secondary | ICD-10-CM | POA: Diagnosis not present

## 2015-11-23 DIAGNOSIS — M3210 Systemic lupus erythematosus, organ or system involvement unspecified: Secondary | ICD-10-CM | POA: Diagnosis not present

## 2015-11-23 DIAGNOSIS — N186 End stage renal disease: Secondary | ICD-10-CM | POA: Diagnosis not present

## 2015-11-25 DIAGNOSIS — M3210 Systemic lupus erythematosus, organ or system involvement unspecified: Secondary | ICD-10-CM | POA: Diagnosis not present

## 2015-11-25 DIAGNOSIS — N186 End stage renal disease: Secondary | ICD-10-CM | POA: Diagnosis not present

## 2015-11-25 DIAGNOSIS — D509 Iron deficiency anemia, unspecified: Secondary | ICD-10-CM | POA: Diagnosis not present

## 2015-11-25 DIAGNOSIS — N2581 Secondary hyperparathyroidism of renal origin: Secondary | ICD-10-CM | POA: Diagnosis not present

## 2015-11-25 DIAGNOSIS — D631 Anemia in chronic kidney disease: Secondary | ICD-10-CM | POA: Diagnosis not present

## 2015-11-28 DIAGNOSIS — N2581 Secondary hyperparathyroidism of renal origin: Secondary | ICD-10-CM | POA: Diagnosis not present

## 2015-11-28 DIAGNOSIS — M3210 Systemic lupus erythematosus, organ or system involvement unspecified: Secondary | ICD-10-CM | POA: Diagnosis not present

## 2015-11-28 DIAGNOSIS — D631 Anemia in chronic kidney disease: Secondary | ICD-10-CM | POA: Diagnosis not present

## 2015-11-28 DIAGNOSIS — D509 Iron deficiency anemia, unspecified: Secondary | ICD-10-CM | POA: Diagnosis not present

## 2015-11-28 DIAGNOSIS — N186 End stage renal disease: Secondary | ICD-10-CM | POA: Diagnosis not present

## 2015-11-30 DIAGNOSIS — M3210 Systemic lupus erythematosus, organ or system involvement unspecified: Secondary | ICD-10-CM | POA: Diagnosis not present

## 2015-11-30 DIAGNOSIS — N2581 Secondary hyperparathyroidism of renal origin: Secondary | ICD-10-CM | POA: Diagnosis not present

## 2015-11-30 DIAGNOSIS — D631 Anemia in chronic kidney disease: Secondary | ICD-10-CM | POA: Diagnosis not present

## 2015-11-30 DIAGNOSIS — D509 Iron deficiency anemia, unspecified: Secondary | ICD-10-CM | POA: Diagnosis not present

## 2015-11-30 DIAGNOSIS — N186 End stage renal disease: Secondary | ICD-10-CM | POA: Diagnosis not present

## 2015-12-02 DIAGNOSIS — N2581 Secondary hyperparathyroidism of renal origin: Secondary | ICD-10-CM | POA: Diagnosis not present

## 2015-12-02 DIAGNOSIS — D631 Anemia in chronic kidney disease: Secondary | ICD-10-CM | POA: Diagnosis not present

## 2015-12-02 DIAGNOSIS — M3210 Systemic lupus erythematosus, organ or system involvement unspecified: Secondary | ICD-10-CM | POA: Diagnosis not present

## 2015-12-02 DIAGNOSIS — N186 End stage renal disease: Secondary | ICD-10-CM | POA: Diagnosis not present

## 2015-12-02 DIAGNOSIS — D509 Iron deficiency anemia, unspecified: Secondary | ICD-10-CM | POA: Diagnosis not present

## 2015-12-05 DIAGNOSIS — D631 Anemia in chronic kidney disease: Secondary | ICD-10-CM | POA: Diagnosis not present

## 2015-12-05 DIAGNOSIS — N186 End stage renal disease: Secondary | ICD-10-CM | POA: Diagnosis not present

## 2015-12-05 DIAGNOSIS — N2581 Secondary hyperparathyroidism of renal origin: Secondary | ICD-10-CM | POA: Diagnosis not present

## 2015-12-05 DIAGNOSIS — M3210 Systemic lupus erythematosus, organ or system involvement unspecified: Secondary | ICD-10-CM | POA: Diagnosis not present

## 2015-12-05 DIAGNOSIS — D509 Iron deficiency anemia, unspecified: Secondary | ICD-10-CM | POA: Diagnosis not present

## 2015-12-07 DIAGNOSIS — D631 Anemia in chronic kidney disease: Secondary | ICD-10-CM | POA: Diagnosis not present

## 2015-12-07 DIAGNOSIS — D509 Iron deficiency anemia, unspecified: Secondary | ICD-10-CM | POA: Diagnosis not present

## 2015-12-07 DIAGNOSIS — N186 End stage renal disease: Secondary | ICD-10-CM | POA: Diagnosis not present

## 2015-12-07 DIAGNOSIS — M3210 Systemic lupus erythematosus, organ or system involvement unspecified: Secondary | ICD-10-CM | POA: Diagnosis not present

## 2015-12-07 DIAGNOSIS — N2581 Secondary hyperparathyroidism of renal origin: Secondary | ICD-10-CM | POA: Diagnosis not present

## 2015-12-09 DIAGNOSIS — D631 Anemia in chronic kidney disease: Secondary | ICD-10-CM | POA: Diagnosis not present

## 2015-12-09 DIAGNOSIS — N186 End stage renal disease: Secondary | ICD-10-CM | POA: Diagnosis not present

## 2015-12-09 DIAGNOSIS — N2581 Secondary hyperparathyroidism of renal origin: Secondary | ICD-10-CM | POA: Diagnosis not present

## 2015-12-09 DIAGNOSIS — M3210 Systemic lupus erythematosus, organ or system involvement unspecified: Secondary | ICD-10-CM | POA: Diagnosis not present

## 2015-12-09 DIAGNOSIS — D509 Iron deficiency anemia, unspecified: Secondary | ICD-10-CM | POA: Diagnosis not present

## 2015-12-12 DIAGNOSIS — D631 Anemia in chronic kidney disease: Secondary | ICD-10-CM | POA: Diagnosis not present

## 2015-12-12 DIAGNOSIS — D509 Iron deficiency anemia, unspecified: Secondary | ICD-10-CM | POA: Diagnosis not present

## 2015-12-12 DIAGNOSIS — N186 End stage renal disease: Secondary | ICD-10-CM | POA: Diagnosis not present

## 2015-12-12 DIAGNOSIS — M3210 Systemic lupus erythematosus, organ or system involvement unspecified: Secondary | ICD-10-CM | POA: Diagnosis not present

## 2015-12-12 DIAGNOSIS — N2581 Secondary hyperparathyroidism of renal origin: Secondary | ICD-10-CM | POA: Diagnosis not present

## 2015-12-14 DIAGNOSIS — N186 End stage renal disease: Secondary | ICD-10-CM | POA: Diagnosis not present

## 2015-12-14 DIAGNOSIS — M3210 Systemic lupus erythematosus, organ or system involvement unspecified: Secondary | ICD-10-CM | POA: Diagnosis not present

## 2015-12-14 DIAGNOSIS — N2581 Secondary hyperparathyroidism of renal origin: Secondary | ICD-10-CM | POA: Diagnosis not present

## 2015-12-14 DIAGNOSIS — D509 Iron deficiency anemia, unspecified: Secondary | ICD-10-CM | POA: Diagnosis not present

## 2015-12-14 DIAGNOSIS — D631 Anemia in chronic kidney disease: Secondary | ICD-10-CM | POA: Diagnosis not present

## 2015-12-15 ENCOUNTER — Ambulatory Visit: Payer: Medicare Other | Admitting: Internal Medicine

## 2015-12-16 DIAGNOSIS — N186 End stage renal disease: Secondary | ICD-10-CM | POA: Diagnosis not present

## 2015-12-16 DIAGNOSIS — D631 Anemia in chronic kidney disease: Secondary | ICD-10-CM | POA: Diagnosis not present

## 2015-12-16 DIAGNOSIS — M3210 Systemic lupus erythematosus, organ or system involvement unspecified: Secondary | ICD-10-CM | POA: Diagnosis not present

## 2015-12-16 DIAGNOSIS — D509 Iron deficiency anemia, unspecified: Secondary | ICD-10-CM | POA: Diagnosis not present

## 2015-12-16 DIAGNOSIS — N2581 Secondary hyperparathyroidism of renal origin: Secondary | ICD-10-CM | POA: Diagnosis not present

## 2015-12-19 DIAGNOSIS — D509 Iron deficiency anemia, unspecified: Secondary | ICD-10-CM | POA: Diagnosis not present

## 2015-12-19 DIAGNOSIS — N2581 Secondary hyperparathyroidism of renal origin: Secondary | ICD-10-CM | POA: Diagnosis not present

## 2015-12-19 DIAGNOSIS — D631 Anemia in chronic kidney disease: Secondary | ICD-10-CM | POA: Diagnosis not present

## 2015-12-19 DIAGNOSIS — I120 Hypertensive chronic kidney disease with stage 5 chronic kidney disease or end stage renal disease: Secondary | ICD-10-CM | POA: Diagnosis not present

## 2015-12-19 DIAGNOSIS — M3210 Systemic lupus erythematosus, organ or system involvement unspecified: Secondary | ICD-10-CM | POA: Diagnosis not present

## 2015-12-19 DIAGNOSIS — Z992 Dependence on renal dialysis: Secondary | ICD-10-CM | POA: Diagnosis not present

## 2015-12-19 DIAGNOSIS — N186 End stage renal disease: Secondary | ICD-10-CM | POA: Diagnosis not present

## 2015-12-21 DIAGNOSIS — M3210 Systemic lupus erythematosus, organ or system involvement unspecified: Secondary | ICD-10-CM | POA: Diagnosis not present

## 2015-12-21 DIAGNOSIS — D631 Anemia in chronic kidney disease: Secondary | ICD-10-CM | POA: Diagnosis not present

## 2015-12-21 DIAGNOSIS — N2581 Secondary hyperparathyroidism of renal origin: Secondary | ICD-10-CM | POA: Diagnosis not present

## 2015-12-21 DIAGNOSIS — N186 End stage renal disease: Secondary | ICD-10-CM | POA: Diagnosis not present

## 2015-12-21 DIAGNOSIS — D509 Iron deficiency anemia, unspecified: Secondary | ICD-10-CM | POA: Diagnosis not present

## 2015-12-23 DIAGNOSIS — D631 Anemia in chronic kidney disease: Secondary | ICD-10-CM | POA: Diagnosis not present

## 2015-12-23 DIAGNOSIS — D509 Iron deficiency anemia, unspecified: Secondary | ICD-10-CM | POA: Diagnosis not present

## 2015-12-23 DIAGNOSIS — N2581 Secondary hyperparathyroidism of renal origin: Secondary | ICD-10-CM | POA: Diagnosis not present

## 2015-12-23 DIAGNOSIS — M3210 Systemic lupus erythematosus, organ or system involvement unspecified: Secondary | ICD-10-CM | POA: Diagnosis not present

## 2015-12-23 DIAGNOSIS — N186 End stage renal disease: Secondary | ICD-10-CM | POA: Diagnosis not present

## 2015-12-26 DIAGNOSIS — M3210 Systemic lupus erythematosus, organ or system involvement unspecified: Secondary | ICD-10-CM | POA: Diagnosis not present

## 2015-12-26 DIAGNOSIS — N2581 Secondary hyperparathyroidism of renal origin: Secondary | ICD-10-CM | POA: Diagnosis not present

## 2015-12-26 DIAGNOSIS — D631 Anemia in chronic kidney disease: Secondary | ICD-10-CM | POA: Diagnosis not present

## 2015-12-26 DIAGNOSIS — D509 Iron deficiency anemia, unspecified: Secondary | ICD-10-CM | POA: Diagnosis not present

## 2015-12-26 DIAGNOSIS — N186 End stage renal disease: Secondary | ICD-10-CM | POA: Diagnosis not present

## 2015-12-28 DIAGNOSIS — N186 End stage renal disease: Secondary | ICD-10-CM | POA: Diagnosis not present

## 2015-12-28 DIAGNOSIS — N2581 Secondary hyperparathyroidism of renal origin: Secondary | ICD-10-CM | POA: Diagnosis not present

## 2015-12-28 DIAGNOSIS — D509 Iron deficiency anemia, unspecified: Secondary | ICD-10-CM | POA: Diagnosis not present

## 2015-12-28 DIAGNOSIS — M3210 Systemic lupus erythematosus, organ or system involvement unspecified: Secondary | ICD-10-CM | POA: Diagnosis not present

## 2015-12-28 DIAGNOSIS — D631 Anemia in chronic kidney disease: Secondary | ICD-10-CM | POA: Diagnosis not present

## 2015-12-30 DIAGNOSIS — D631 Anemia in chronic kidney disease: Secondary | ICD-10-CM | POA: Diagnosis not present

## 2015-12-30 DIAGNOSIS — N2581 Secondary hyperparathyroidism of renal origin: Secondary | ICD-10-CM | POA: Diagnosis not present

## 2015-12-30 DIAGNOSIS — M3210 Systemic lupus erythematosus, organ or system involvement unspecified: Secondary | ICD-10-CM | POA: Diagnosis not present

## 2015-12-30 DIAGNOSIS — D509 Iron deficiency anemia, unspecified: Secondary | ICD-10-CM | POA: Diagnosis not present

## 2015-12-30 DIAGNOSIS — N186 End stage renal disease: Secondary | ICD-10-CM | POA: Diagnosis not present

## 2016-01-02 DIAGNOSIS — N186 End stage renal disease: Secondary | ICD-10-CM | POA: Diagnosis not present

## 2016-01-02 DIAGNOSIS — D631 Anemia in chronic kidney disease: Secondary | ICD-10-CM | POA: Diagnosis not present

## 2016-01-02 DIAGNOSIS — N2581 Secondary hyperparathyroidism of renal origin: Secondary | ICD-10-CM | POA: Diagnosis not present

## 2016-01-02 DIAGNOSIS — D509 Iron deficiency anemia, unspecified: Secondary | ICD-10-CM | POA: Diagnosis not present

## 2016-01-02 DIAGNOSIS — M3210 Systemic lupus erythematosus, organ or system involvement unspecified: Secondary | ICD-10-CM | POA: Diagnosis not present

## 2016-01-04 DIAGNOSIS — N186 End stage renal disease: Secondary | ICD-10-CM | POA: Diagnosis not present

## 2016-01-04 DIAGNOSIS — D631 Anemia in chronic kidney disease: Secondary | ICD-10-CM | POA: Diagnosis not present

## 2016-01-04 DIAGNOSIS — M3210 Systemic lupus erythematosus, organ or system involvement unspecified: Secondary | ICD-10-CM | POA: Diagnosis not present

## 2016-01-04 DIAGNOSIS — N2581 Secondary hyperparathyroidism of renal origin: Secondary | ICD-10-CM | POA: Diagnosis not present

## 2016-01-04 DIAGNOSIS — D509 Iron deficiency anemia, unspecified: Secondary | ICD-10-CM | POA: Diagnosis not present

## 2016-01-06 DIAGNOSIS — N2581 Secondary hyperparathyroidism of renal origin: Secondary | ICD-10-CM | POA: Diagnosis not present

## 2016-01-06 DIAGNOSIS — N186 End stage renal disease: Secondary | ICD-10-CM | POA: Diagnosis not present

## 2016-01-06 DIAGNOSIS — D631 Anemia in chronic kidney disease: Secondary | ICD-10-CM | POA: Diagnosis not present

## 2016-01-06 DIAGNOSIS — M3210 Systemic lupus erythematosus, organ or system involvement unspecified: Secondary | ICD-10-CM | POA: Diagnosis not present

## 2016-01-06 DIAGNOSIS — D509 Iron deficiency anemia, unspecified: Secondary | ICD-10-CM | POA: Diagnosis not present

## 2016-01-09 DIAGNOSIS — N2581 Secondary hyperparathyroidism of renal origin: Secondary | ICD-10-CM | POA: Diagnosis not present

## 2016-01-09 DIAGNOSIS — M3210 Systemic lupus erythematosus, organ or system involvement unspecified: Secondary | ICD-10-CM | POA: Diagnosis not present

## 2016-01-09 DIAGNOSIS — D509 Iron deficiency anemia, unspecified: Secondary | ICD-10-CM | POA: Diagnosis not present

## 2016-01-09 DIAGNOSIS — N186 End stage renal disease: Secondary | ICD-10-CM | POA: Diagnosis not present

## 2016-01-09 DIAGNOSIS — D631 Anemia in chronic kidney disease: Secondary | ICD-10-CM | POA: Diagnosis not present

## 2016-01-10 ENCOUNTER — Other Ambulatory Visit: Payer: Self-pay | Admitting: Internal Medicine

## 2016-01-10 DIAGNOSIS — Z1231 Encounter for screening mammogram for malignant neoplasm of breast: Secondary | ICD-10-CM

## 2016-01-11 DIAGNOSIS — M3210 Systemic lupus erythematosus, organ or system involvement unspecified: Secondary | ICD-10-CM | POA: Diagnosis not present

## 2016-01-11 DIAGNOSIS — D509 Iron deficiency anemia, unspecified: Secondary | ICD-10-CM | POA: Diagnosis not present

## 2016-01-11 DIAGNOSIS — N2581 Secondary hyperparathyroidism of renal origin: Secondary | ICD-10-CM | POA: Diagnosis not present

## 2016-01-11 DIAGNOSIS — D631 Anemia in chronic kidney disease: Secondary | ICD-10-CM | POA: Diagnosis not present

## 2016-01-11 DIAGNOSIS — N186 End stage renal disease: Secondary | ICD-10-CM | POA: Diagnosis not present

## 2016-01-13 DIAGNOSIS — M3210 Systemic lupus erythematosus, organ or system involvement unspecified: Secondary | ICD-10-CM | POA: Diagnosis not present

## 2016-01-13 DIAGNOSIS — N2581 Secondary hyperparathyroidism of renal origin: Secondary | ICD-10-CM | POA: Diagnosis not present

## 2016-01-13 DIAGNOSIS — D509 Iron deficiency anemia, unspecified: Secondary | ICD-10-CM | POA: Diagnosis not present

## 2016-01-13 DIAGNOSIS — N186 End stage renal disease: Secondary | ICD-10-CM | POA: Diagnosis not present

## 2016-01-13 DIAGNOSIS — D631 Anemia in chronic kidney disease: Secondary | ICD-10-CM | POA: Diagnosis not present

## 2016-01-16 DIAGNOSIS — N186 End stage renal disease: Secondary | ICD-10-CM | POA: Diagnosis not present

## 2016-01-16 DIAGNOSIS — M3210 Systemic lupus erythematosus, organ or system involvement unspecified: Secondary | ICD-10-CM | POA: Diagnosis not present

## 2016-01-16 DIAGNOSIS — D509 Iron deficiency anemia, unspecified: Secondary | ICD-10-CM | POA: Diagnosis not present

## 2016-01-16 DIAGNOSIS — D631 Anemia in chronic kidney disease: Secondary | ICD-10-CM | POA: Diagnosis not present

## 2016-01-16 DIAGNOSIS — N2581 Secondary hyperparathyroidism of renal origin: Secondary | ICD-10-CM | POA: Diagnosis not present

## 2016-01-17 DIAGNOSIS — M25511 Pain in right shoulder: Secondary | ICD-10-CM | POA: Diagnosis not present

## 2016-01-17 DIAGNOSIS — M25551 Pain in right hip: Secondary | ICD-10-CM | POA: Diagnosis not present

## 2016-01-18 DIAGNOSIS — D631 Anemia in chronic kidney disease: Secondary | ICD-10-CM | POA: Diagnosis not present

## 2016-01-18 DIAGNOSIS — N2581 Secondary hyperparathyroidism of renal origin: Secondary | ICD-10-CM | POA: Diagnosis not present

## 2016-01-18 DIAGNOSIS — M3210 Systemic lupus erythematosus, organ or system involvement unspecified: Secondary | ICD-10-CM | POA: Diagnosis not present

## 2016-01-18 DIAGNOSIS — D509 Iron deficiency anemia, unspecified: Secondary | ICD-10-CM | POA: Diagnosis not present

## 2016-01-18 DIAGNOSIS — N186 End stage renal disease: Secondary | ICD-10-CM | POA: Diagnosis not present

## 2016-01-19 ENCOUNTER — Ambulatory Visit: Payer: Self-pay

## 2016-01-19 DIAGNOSIS — N186 End stage renal disease: Secondary | ICD-10-CM | POA: Diagnosis not present

## 2016-01-19 DIAGNOSIS — Z992 Dependence on renal dialysis: Secondary | ICD-10-CM | POA: Diagnosis not present

## 2016-01-19 DIAGNOSIS — I120 Hypertensive chronic kidney disease with stage 5 chronic kidney disease or end stage renal disease: Secondary | ICD-10-CM | POA: Diagnosis not present

## 2016-01-20 DIAGNOSIS — D509 Iron deficiency anemia, unspecified: Secondary | ICD-10-CM | POA: Diagnosis not present

## 2016-01-20 DIAGNOSIS — Z23 Encounter for immunization: Secondary | ICD-10-CM | POA: Diagnosis not present

## 2016-01-20 DIAGNOSIS — N2581 Secondary hyperparathyroidism of renal origin: Secondary | ICD-10-CM | POA: Diagnosis not present

## 2016-01-20 DIAGNOSIS — M3210 Systemic lupus erythematosus, organ or system involvement unspecified: Secondary | ICD-10-CM | POA: Diagnosis not present

## 2016-01-20 DIAGNOSIS — N186 End stage renal disease: Secondary | ICD-10-CM | POA: Diagnosis not present

## 2016-01-20 DIAGNOSIS — D631 Anemia in chronic kidney disease: Secondary | ICD-10-CM | POA: Diagnosis not present

## 2016-01-23 DIAGNOSIS — N2581 Secondary hyperparathyroidism of renal origin: Secondary | ICD-10-CM | POA: Diagnosis not present

## 2016-01-23 DIAGNOSIS — M3210 Systemic lupus erythematosus, organ or system involvement unspecified: Secondary | ICD-10-CM | POA: Diagnosis not present

## 2016-01-23 DIAGNOSIS — D631 Anemia in chronic kidney disease: Secondary | ICD-10-CM | POA: Diagnosis not present

## 2016-01-23 DIAGNOSIS — Z23 Encounter for immunization: Secondary | ICD-10-CM | POA: Diagnosis not present

## 2016-01-23 DIAGNOSIS — N186 End stage renal disease: Secondary | ICD-10-CM | POA: Diagnosis not present

## 2016-01-23 DIAGNOSIS — D509 Iron deficiency anemia, unspecified: Secondary | ICD-10-CM | POA: Diagnosis not present

## 2016-01-25 DIAGNOSIS — N186 End stage renal disease: Secondary | ICD-10-CM | POA: Diagnosis not present

## 2016-01-25 DIAGNOSIS — M3210 Systemic lupus erythematosus, organ or system involvement unspecified: Secondary | ICD-10-CM | POA: Diagnosis not present

## 2016-01-25 DIAGNOSIS — D631 Anemia in chronic kidney disease: Secondary | ICD-10-CM | POA: Diagnosis not present

## 2016-01-25 DIAGNOSIS — Z23 Encounter for immunization: Secondary | ICD-10-CM | POA: Diagnosis not present

## 2016-01-25 DIAGNOSIS — N2581 Secondary hyperparathyroidism of renal origin: Secondary | ICD-10-CM | POA: Diagnosis not present

## 2016-01-25 DIAGNOSIS — D509 Iron deficiency anemia, unspecified: Secondary | ICD-10-CM | POA: Diagnosis not present

## 2016-01-27 DIAGNOSIS — Z23 Encounter for immunization: Secondary | ICD-10-CM | POA: Diagnosis not present

## 2016-01-27 DIAGNOSIS — N186 End stage renal disease: Secondary | ICD-10-CM | POA: Diagnosis not present

## 2016-01-27 DIAGNOSIS — M3210 Systemic lupus erythematosus, organ or system involvement unspecified: Secondary | ICD-10-CM | POA: Diagnosis not present

## 2016-01-27 DIAGNOSIS — D631 Anemia in chronic kidney disease: Secondary | ICD-10-CM | POA: Diagnosis not present

## 2016-01-27 DIAGNOSIS — D509 Iron deficiency anemia, unspecified: Secondary | ICD-10-CM | POA: Diagnosis not present

## 2016-01-27 DIAGNOSIS — N2581 Secondary hyperparathyroidism of renal origin: Secondary | ICD-10-CM | POA: Diagnosis not present

## 2016-01-30 DIAGNOSIS — D631 Anemia in chronic kidney disease: Secondary | ICD-10-CM | POA: Diagnosis not present

## 2016-01-30 DIAGNOSIS — D509 Iron deficiency anemia, unspecified: Secondary | ICD-10-CM | POA: Diagnosis not present

## 2016-01-30 DIAGNOSIS — M3210 Systemic lupus erythematosus, organ or system involvement unspecified: Secondary | ICD-10-CM | POA: Diagnosis not present

## 2016-01-30 DIAGNOSIS — Z23 Encounter for immunization: Secondary | ICD-10-CM | POA: Diagnosis not present

## 2016-01-30 DIAGNOSIS — N186 End stage renal disease: Secondary | ICD-10-CM | POA: Diagnosis not present

## 2016-01-30 DIAGNOSIS — N2581 Secondary hyperparathyroidism of renal origin: Secondary | ICD-10-CM | POA: Diagnosis not present

## 2016-01-31 ENCOUNTER — Ambulatory Visit: Payer: Self-pay

## 2016-02-02 ENCOUNTER — Ambulatory Visit: Payer: Self-pay

## 2016-02-02 DIAGNOSIS — M25511 Pain in right shoulder: Secondary | ICD-10-CM | POA: Diagnosis not present

## 2016-02-03 DIAGNOSIS — M3210 Systemic lupus erythematosus, organ or system involvement unspecified: Secondary | ICD-10-CM | POA: Diagnosis not present

## 2016-02-03 DIAGNOSIS — N186 End stage renal disease: Secondary | ICD-10-CM | POA: Diagnosis not present

## 2016-02-03 DIAGNOSIS — Z23 Encounter for immunization: Secondary | ICD-10-CM | POA: Diagnosis not present

## 2016-02-03 DIAGNOSIS — N2581 Secondary hyperparathyroidism of renal origin: Secondary | ICD-10-CM | POA: Diagnosis not present

## 2016-02-03 DIAGNOSIS — D509 Iron deficiency anemia, unspecified: Secondary | ICD-10-CM | POA: Diagnosis not present

## 2016-02-03 DIAGNOSIS — D631 Anemia in chronic kidney disease: Secondary | ICD-10-CM | POA: Diagnosis not present

## 2016-02-06 DIAGNOSIS — Z23 Encounter for immunization: Secondary | ICD-10-CM | POA: Diagnosis not present

## 2016-02-06 DIAGNOSIS — N186 End stage renal disease: Secondary | ICD-10-CM | POA: Diagnosis not present

## 2016-02-06 DIAGNOSIS — D631 Anemia in chronic kidney disease: Secondary | ICD-10-CM | POA: Diagnosis not present

## 2016-02-06 DIAGNOSIS — M3210 Systemic lupus erythematosus, organ or system involvement unspecified: Secondary | ICD-10-CM | POA: Diagnosis not present

## 2016-02-06 DIAGNOSIS — D509 Iron deficiency anemia, unspecified: Secondary | ICD-10-CM | POA: Diagnosis not present

## 2016-02-06 DIAGNOSIS — N2581 Secondary hyperparathyroidism of renal origin: Secondary | ICD-10-CM | POA: Diagnosis not present

## 2016-02-07 DIAGNOSIS — M25511 Pain in right shoulder: Secondary | ICD-10-CM | POA: Diagnosis not present

## 2016-02-08 DIAGNOSIS — M3210 Systemic lupus erythematosus, organ or system involvement unspecified: Secondary | ICD-10-CM | POA: Diagnosis not present

## 2016-02-08 DIAGNOSIS — N2581 Secondary hyperparathyroidism of renal origin: Secondary | ICD-10-CM | POA: Diagnosis not present

## 2016-02-08 DIAGNOSIS — N186 End stage renal disease: Secondary | ICD-10-CM | POA: Diagnosis not present

## 2016-02-08 DIAGNOSIS — D631 Anemia in chronic kidney disease: Secondary | ICD-10-CM | POA: Diagnosis not present

## 2016-02-08 DIAGNOSIS — D509 Iron deficiency anemia, unspecified: Secondary | ICD-10-CM | POA: Diagnosis not present

## 2016-02-08 DIAGNOSIS — Z23 Encounter for immunization: Secondary | ICD-10-CM | POA: Diagnosis not present

## 2016-02-10 DIAGNOSIS — D509 Iron deficiency anemia, unspecified: Secondary | ICD-10-CM | POA: Diagnosis not present

## 2016-02-10 DIAGNOSIS — Z23 Encounter for immunization: Secondary | ICD-10-CM | POA: Diagnosis not present

## 2016-02-10 DIAGNOSIS — N186 End stage renal disease: Secondary | ICD-10-CM | POA: Diagnosis not present

## 2016-02-10 DIAGNOSIS — N2581 Secondary hyperparathyroidism of renal origin: Secondary | ICD-10-CM | POA: Diagnosis not present

## 2016-02-10 DIAGNOSIS — M3210 Systemic lupus erythematosus, organ or system involvement unspecified: Secondary | ICD-10-CM | POA: Diagnosis not present

## 2016-02-10 DIAGNOSIS — D631 Anemia in chronic kidney disease: Secondary | ICD-10-CM | POA: Diagnosis not present

## 2016-02-13 DIAGNOSIS — M3210 Systemic lupus erythematosus, organ or system involvement unspecified: Secondary | ICD-10-CM | POA: Diagnosis not present

## 2016-02-13 DIAGNOSIS — D631 Anemia in chronic kidney disease: Secondary | ICD-10-CM | POA: Diagnosis not present

## 2016-02-13 DIAGNOSIS — N2581 Secondary hyperparathyroidism of renal origin: Secondary | ICD-10-CM | POA: Diagnosis not present

## 2016-02-13 DIAGNOSIS — N186 End stage renal disease: Secondary | ICD-10-CM | POA: Diagnosis not present

## 2016-02-13 DIAGNOSIS — D509 Iron deficiency anemia, unspecified: Secondary | ICD-10-CM | POA: Diagnosis not present

## 2016-02-13 DIAGNOSIS — Z23 Encounter for immunization: Secondary | ICD-10-CM | POA: Diagnosis not present

## 2016-02-15 DIAGNOSIS — M3210 Systemic lupus erythematosus, organ or system involvement unspecified: Secondary | ICD-10-CM | POA: Diagnosis not present

## 2016-02-15 DIAGNOSIS — N186 End stage renal disease: Secondary | ICD-10-CM | POA: Diagnosis not present

## 2016-02-15 DIAGNOSIS — Z23 Encounter for immunization: Secondary | ICD-10-CM | POA: Diagnosis not present

## 2016-02-15 DIAGNOSIS — D509 Iron deficiency anemia, unspecified: Secondary | ICD-10-CM | POA: Diagnosis not present

## 2016-02-15 DIAGNOSIS — N2581 Secondary hyperparathyroidism of renal origin: Secondary | ICD-10-CM | POA: Diagnosis not present

## 2016-02-15 DIAGNOSIS — D631 Anemia in chronic kidney disease: Secondary | ICD-10-CM | POA: Diagnosis not present

## 2016-02-17 DIAGNOSIS — D509 Iron deficiency anemia, unspecified: Secondary | ICD-10-CM | POA: Diagnosis not present

## 2016-02-17 DIAGNOSIS — N186 End stage renal disease: Secondary | ICD-10-CM | POA: Diagnosis not present

## 2016-02-17 DIAGNOSIS — M3210 Systemic lupus erythematosus, organ or system involvement unspecified: Secondary | ICD-10-CM | POA: Diagnosis not present

## 2016-02-17 DIAGNOSIS — N2581 Secondary hyperparathyroidism of renal origin: Secondary | ICD-10-CM | POA: Diagnosis not present

## 2016-02-17 DIAGNOSIS — Z23 Encounter for immunization: Secondary | ICD-10-CM | POA: Diagnosis not present

## 2016-02-17 DIAGNOSIS — D631 Anemia in chronic kidney disease: Secondary | ICD-10-CM | POA: Diagnosis not present

## 2016-02-18 DIAGNOSIS — Z992 Dependence on renal dialysis: Secondary | ICD-10-CM | POA: Diagnosis not present

## 2016-02-18 DIAGNOSIS — I120 Hypertensive chronic kidney disease with stage 5 chronic kidney disease or end stage renal disease: Secondary | ICD-10-CM | POA: Diagnosis not present

## 2016-02-18 DIAGNOSIS — N186 End stage renal disease: Secondary | ICD-10-CM | POA: Diagnosis not present

## 2016-02-20 DIAGNOSIS — N186 End stage renal disease: Secondary | ICD-10-CM | POA: Diagnosis not present

## 2016-02-20 DIAGNOSIS — M3210 Systemic lupus erythematosus, organ or system involvement unspecified: Secondary | ICD-10-CM | POA: Diagnosis not present

## 2016-02-20 DIAGNOSIS — D631 Anemia in chronic kidney disease: Secondary | ICD-10-CM | POA: Diagnosis not present

## 2016-02-20 DIAGNOSIS — D509 Iron deficiency anemia, unspecified: Secondary | ICD-10-CM | POA: Diagnosis not present

## 2016-02-20 DIAGNOSIS — N2581 Secondary hyperparathyroidism of renal origin: Secondary | ICD-10-CM | POA: Diagnosis not present

## 2016-02-21 DIAGNOSIS — I10 Essential (primary) hypertension: Secondary | ICD-10-CM | POA: Diagnosis not present

## 2016-02-21 DIAGNOSIS — Z720 Tobacco use: Secondary | ICD-10-CM | POA: Diagnosis not present

## 2016-02-21 DIAGNOSIS — Z01118 Encounter for examination of ears and hearing with other abnormal findings: Secondary | ICD-10-CM | POA: Diagnosis not present

## 2016-02-21 DIAGNOSIS — N186 End stage renal disease: Secondary | ICD-10-CM | POA: Diagnosis not present

## 2016-02-21 DIAGNOSIS — Z131 Encounter for screening for diabetes mellitus: Secondary | ICD-10-CM | POA: Diagnosis not present

## 2016-02-21 DIAGNOSIS — R05 Cough: Secondary | ICD-10-CM | POA: Diagnosis not present

## 2016-02-21 DIAGNOSIS — Z992 Dependence on renal dialysis: Secondary | ICD-10-CM | POA: Diagnosis not present

## 2016-02-21 DIAGNOSIS — Z0000 Encounter for general adult medical examination without abnormal findings: Secondary | ICD-10-CM | POA: Diagnosis not present

## 2016-02-21 DIAGNOSIS — E785 Hyperlipidemia, unspecified: Secondary | ICD-10-CM | POA: Diagnosis not present

## 2016-02-21 DIAGNOSIS — H538 Other visual disturbances: Secondary | ICD-10-CM | POA: Diagnosis not present

## 2016-02-21 DIAGNOSIS — Z8719 Personal history of other diseases of the digestive system: Secondary | ICD-10-CM | POA: Diagnosis not present

## 2016-02-21 DIAGNOSIS — Z8673 Personal history of transient ischemic attack (TIA), and cerebral infarction without residual deficits: Secondary | ICD-10-CM | POA: Diagnosis not present

## 2016-02-22 DIAGNOSIS — D631 Anemia in chronic kidney disease: Secondary | ICD-10-CM | POA: Diagnosis not present

## 2016-02-22 DIAGNOSIS — N2581 Secondary hyperparathyroidism of renal origin: Secondary | ICD-10-CM | POA: Diagnosis not present

## 2016-02-22 DIAGNOSIS — M3210 Systemic lupus erythematosus, organ or system involvement unspecified: Secondary | ICD-10-CM | POA: Diagnosis not present

## 2016-02-22 DIAGNOSIS — N186 End stage renal disease: Secondary | ICD-10-CM | POA: Diagnosis not present

## 2016-02-22 DIAGNOSIS — D509 Iron deficiency anemia, unspecified: Secondary | ICD-10-CM | POA: Diagnosis not present

## 2016-02-23 ENCOUNTER — Ambulatory Visit (INDEPENDENT_AMBULATORY_CARE_PROVIDER_SITE_OTHER): Payer: Medicare Other | Admitting: Internal Medicine

## 2016-02-23 ENCOUNTER — Encounter: Payer: Self-pay | Admitting: Internal Medicine

## 2016-02-23 ENCOUNTER — Encounter (INDEPENDENT_AMBULATORY_CARE_PROVIDER_SITE_OTHER): Payer: Self-pay

## 2016-02-23 ENCOUNTER — Other Ambulatory Visit: Payer: Self-pay | Admitting: *Deleted

## 2016-02-23 VITALS — BP 150/72 | HR 87 | Ht 66.0 in | Wt 131.2 lb

## 2016-02-23 DIAGNOSIS — I10 Essential (primary) hypertension: Secondary | ICD-10-CM | POA: Diagnosis not present

## 2016-02-23 DIAGNOSIS — I050 Rheumatic mitral stenosis: Secondary | ICD-10-CM

## 2016-02-23 DIAGNOSIS — E785 Hyperlipidemia, unspecified: Secondary | ICD-10-CM

## 2016-02-23 LAB — CBC
HEMATOCRIT: 24.9 % — AB (ref 35.0–45.0)
Hemoglobin: 7.7 g/dL — ABNORMAL LOW (ref 11.7–15.5)
MCH: 28.5 pg (ref 27.0–33.0)
MCHC: 30.9 g/dL — ABNORMAL LOW (ref 32.0–36.0)
MCV: 92.2 fL (ref 80.0–100.0)
MPV: 8.5 fL (ref 7.5–12.5)
PLATELETS: 219 10*3/uL (ref 140–400)
RBC: 2.7 MIL/uL — ABNORMAL LOW (ref 3.80–5.10)
RDW: 18.1 % — AB (ref 11.0–15.0)
WBC: 6.8 10*3/uL (ref 3.8–10.8)

## 2016-02-23 LAB — LIPID PANEL
Cholesterol: 187 mg/dL (ref 125–200)
HDL: 55 mg/dL (ref >=46)
LDL CALC: 114 mg/dL (ref <130)
Total CHOL/HDL Ratio: 3.4 Ratio (ref <=5.0)
Triglycerides: 89 mg/dL (ref <150)
VLDL: 18 mg/dL (ref <30)

## 2016-02-23 MED ORDER — ATORVASTATIN CALCIUM 40 MG PO TABS: 40.0000 mg | ORAL_TABLET | Freq: Every day | ORAL | 3 refills | Status: DC

## 2016-02-23 NOTE — Patient Instructions (Signed)
Your physician recommends that you continue on your current medications as directed. Please refer to the Current Medication list given to you today. Your physician recommends that you return for lab work today ([place], CBC) Your physician wants you to follow-up in: [place].  You will receive a reminder letter in the mail two months in advance. If you don't receive a letter, please call our office to schedule the follow-up appointment.

## 2016-02-23 NOTE — Progress Notes (Signed)
Per Dr. Harrington Challenger dc simvastatin, start Lipitor 40 mg once a day.  Recheck lipids in 8 weeks.

## 2016-02-23 NOTE — Progress Notes (Signed)
Cardiology Office Note   Date:  02/23/2016   ID:  [name], DOB [birth year], MRN [id number]  PCP:  Benito Mccreedy, MD  Cardiologist:   Dorris Carnes, MD    F/U of mitral stenosis    History of Present Illness: [name] is a 55 y.o. female with a history of CVA, mitral stenosis, HTN, ESRD, lupus, PUD  She was seen by Kathlene November in Dec 2016  And [facility] pror  Nuclar stress tet in 2016 wa normal   Since seen in clinic she denies CP  Breathing is OK  No palpitations  Dialysis is going good      Outpatient Medications Prior to Visit  Medication Sig Dispense Refill  . amLODipine (NORVASC) 10 MG tablet Take 10 mg by mouth daily.      [name] aspirin 81 MG chewable tablet Chew 1 tablet (81 mg total) by mouth daily.    [name] b complex-vitamin c-folic acid (NEPHRO-VITE) 0.8 MG TABS tablet Take 0.8 mg by mouth at bedtime.    . calcitRIOL (ROCALTROL) 0.5 MCG capsule Take 3 capsules (1.5 mcg total) by mouth every Monday, Wednesday, and Friday with hemodialysis. 12 capsule 0  . cloNIDine (CATAPRES) 0.1 MG tablet Take 1 tablet (0.1 mg total) by mouth 2 (two) times daily. 180 tablet 0  . labetalol (NORMODYNE) 300 MG tablet Take 300 mg by mouth 3 (three) times daily.    [name] lanthanum (FOSRENOL) 1000 MG chewable tablet Chew 2 tablets (2,000 mg total) by mouth 3 (three) times daily with meals. 180 tablet 0  . lisinopril (PRINIVIL,ZESTRIL) 40 MG tablet Take 40 mg by mouth daily.   3  . ondansetron (ZOFRAN ODT) 8 MG disintegrating tablet Take 1 tablet (8 mg total) by mouth every 8 (eight) hours as needed for nausea or vomiting. 12 tablet 0  . ondansetron (ZOFRAN) 4 MG tablet Take 1 tablet (4 mg total) by mouth every 6 (six) hours as needed for nausea. 20 tablet 0  . pantoprazole (PROTONIX) 40 MG tablet Take 1 tablet (40 mg total) by mouth 2 (two) times daily. 60 tablet 0  . simvastatin (ZOCOR) 20 MG tablet Take 1 tablet (20 mg total) by mouth every evening. 30 tablet 0  . labetalol (NORMODYNE) 100 MG  tablet Take 100 mg by mouth 3 (three) times daily. Reported on 07/13/2015     No facility-administered medications prior to visit.      Allergies:   Review of patient's allergies indicates no known allergies.   Past Medical History:  Diagnosis Date  . Anemia   . Blood transfusion '08   [facility]  . Chronic diastolic CHF (congestive heart failure) ([place])   . Dysfunctional uterine bleeding 12/19/2010  . ESRD (end stage renal disease) ([place])    dialysis   Mon Wed Fri  . GERD (gastroesophageal reflux disease)   . Hemodialysis patient ([place])   . Hx of cardiovascular stress test    Lexiscan Myoview 4/16:  Normal stress nuclear study, EF 59%  . Hx of hiatal hernia   . Hypertension   . Lupus   . Mitral stenosis    Echo 4/16:  EF 55-60%, no RWMA, Gr 1 DD, mod MS (mean 9 mmHg), mod LAE, mild RAE, PASP 65, mod to severe TR, trivial eff  . Peptic ulcer disease     Past Surgical History:  Procedure Laterality Date  . DIALYSIS FISTULA CREATION  2007  . ESOPHAGOGASTRODUODENOSCOPY N/A 07/31/2014   Procedure: ESOPHAGOGASTRODUODENOSCOPY (EGD);  Surgeon: Clarene Essex, MD;  Location: [facility] ENDOSCOPY;  Service: Endoscopy;  Laterality: N/A;     Social History:  The patient  reports that she has been smoking Cigarettes.  She has been smoking about 0.25 packs per day. She has never used smokeless tobacco. She reports that she drinks alcohol. She reports that she uses drugs, including Marijuana.   Family History:  The patient's family history includes Liver cancer in her maternal grandmother; Lymphoma in her maternal aunt.    ROS:  Please see the history of present illness. All other systems are reviewed and  Negative to the above problem except as noted.    PHYSICAL EXAM: VS:  BP (!) 150/72   Pulse 87   Ht 5\' 6"  (1.676 m)   Wt 131 lb 3.2 oz (59.5 kg)   LMP 12/05/2010   SpO2 98%   BMI 21.18 kg/m   GEN: Well nourished, well developed, in no acute distress  HEENT: normal  Neck: no JVD, carotid bruits,  or masses Cardiac: RRR; Gr III/VI systolic murmur apex to base  Gr I/Vi diastolic murmur apex   rubs, or gallops,no edema  Respiratory:  clear to auscultation bilaterally, normal work of breathing GI: soft, nontender, nondistended, + BS  No hepatomegaly  MS: no deformity Moving all extremities   Skin: warm and dry, no rash Neuro:  Strength and sensation are intact Psych: euthymic mood, full affect   EKG:  EKG is not ordered today.   Lipid Panel No results found for: CHOL, TRIG, HDL, CHOLHDL, VLDL, LDLCALC, LDLDIRECT    Wt Readings from Last 3 Encounters:  02/23/16 131 lb 3.2 oz (59.5 kg)  07/12/15 129 lb (58.5 kg)  05/04/15 135 lb 2.3 oz (61.3 kg)      ASSESSMENT AND PLAN:  1.  MS    Will review echo re timing of next echo    2  HTN  She did not take meds yet today  Continue on same regimen    3ESRD  Dialysis MWF  4  HL  Check lipids  COntinue statin  F/u in cliinic in 9 months  Will review echo     Current medicines are reviewed at length with the patient today.  The patient does not have concerns regarding medicines.  Signed, Dorris Carnes, MD  02/23/2016 10:19 AM    [address], [place], [place]  [address] Phone: [phone]; Fax: [phone]

## 2016-02-24 DIAGNOSIS — N186 End stage renal disease: Secondary | ICD-10-CM | POA: Diagnosis not present

## 2016-02-24 DIAGNOSIS — N2581 Secondary hyperparathyroidism of renal origin: Secondary | ICD-10-CM | POA: Diagnosis not present

## 2016-02-24 DIAGNOSIS — M3210 Systemic lupus erythematosus, organ or system involvement unspecified: Secondary | ICD-10-CM | POA: Diagnosis not present

## 2016-02-24 DIAGNOSIS — D631 Anemia in chronic kidney disease: Secondary | ICD-10-CM | POA: Diagnosis not present

## 2016-02-24 DIAGNOSIS — D509 Iron deficiency anemia, unspecified: Secondary | ICD-10-CM | POA: Diagnosis not present

## 2016-02-27 DIAGNOSIS — N2581 Secondary hyperparathyroidism of renal origin: Secondary | ICD-10-CM | POA: Diagnosis not present

## 2016-02-27 DIAGNOSIS — D631 Anemia in chronic kidney disease: Secondary | ICD-10-CM | POA: Diagnosis not present

## 2016-02-27 DIAGNOSIS — M3210 Systemic lupus erythematosus, organ or system involvement unspecified: Secondary | ICD-10-CM | POA: Diagnosis not present

## 2016-02-27 DIAGNOSIS — D509 Iron deficiency anemia, unspecified: Secondary | ICD-10-CM | POA: Diagnosis not present

## 2016-02-27 DIAGNOSIS — N186 End stage renal disease: Secondary | ICD-10-CM | POA: Diagnosis not present

## 2016-02-28 ENCOUNTER — Other Ambulatory Visit: Payer: Self-pay | Admitting: *Deleted

## 2016-02-28 DIAGNOSIS — I050 Rheumatic mitral stenosis: Secondary | ICD-10-CM

## 2016-02-28 NOTE — Progress Notes (Signed)
Echo ordered to be done next June, 2018, prior to next appointment with Dr. Harrington Challenger.

## 2016-02-29 DIAGNOSIS — D509 Iron deficiency anemia, unspecified: Secondary | ICD-10-CM | POA: Diagnosis not present

## 2016-02-29 DIAGNOSIS — N2581 Secondary hyperparathyroidism of renal origin: Secondary | ICD-10-CM | POA: Diagnosis not present

## 2016-02-29 DIAGNOSIS — M3210 Systemic lupus erythematosus, organ or system involvement unspecified: Secondary | ICD-10-CM | POA: Diagnosis not present

## 2016-02-29 DIAGNOSIS — N186 End stage renal disease: Secondary | ICD-10-CM | POA: Diagnosis not present

## 2016-02-29 DIAGNOSIS — D631 Anemia in chronic kidney disease: Secondary | ICD-10-CM | POA: Diagnosis not present

## 2016-03-02 DIAGNOSIS — M3210 Systemic lupus erythematosus, organ or system involvement unspecified: Secondary | ICD-10-CM | POA: Diagnosis not present

## 2016-03-02 DIAGNOSIS — N2581 Secondary hyperparathyroidism of renal origin: Secondary | ICD-10-CM | POA: Diagnosis not present

## 2016-03-02 DIAGNOSIS — N186 End stage renal disease: Secondary | ICD-10-CM | POA: Diagnosis not present

## 2016-03-02 DIAGNOSIS — D509 Iron deficiency anemia, unspecified: Secondary | ICD-10-CM | POA: Diagnosis not present

## 2016-03-02 DIAGNOSIS — D631 Anemia in chronic kidney disease: Secondary | ICD-10-CM | POA: Diagnosis not present

## 2016-03-05 DIAGNOSIS — N2581 Secondary hyperparathyroidism of renal origin: Secondary | ICD-10-CM | POA: Diagnosis not present

## 2016-03-05 DIAGNOSIS — D509 Iron deficiency anemia, unspecified: Secondary | ICD-10-CM | POA: Diagnosis not present

## 2016-03-05 DIAGNOSIS — M3210 Systemic lupus erythematosus, organ or system involvement unspecified: Secondary | ICD-10-CM | POA: Diagnosis not present

## 2016-03-05 DIAGNOSIS — N186 End stage renal disease: Secondary | ICD-10-CM | POA: Diagnosis not present

## 2016-03-05 DIAGNOSIS — D631 Anemia in chronic kidney disease: Secondary | ICD-10-CM | POA: Diagnosis not present

## 2016-03-06 DIAGNOSIS — M25511 Pain in right shoulder: Secondary | ICD-10-CM | POA: Diagnosis not present

## 2016-03-07 DIAGNOSIS — D509 Iron deficiency anemia, unspecified: Secondary | ICD-10-CM | POA: Diagnosis not present

## 2016-03-07 DIAGNOSIS — M3210 Systemic lupus erythematosus, organ or system involvement unspecified: Secondary | ICD-10-CM | POA: Diagnosis not present

## 2016-03-07 DIAGNOSIS — D631 Anemia in chronic kidney disease: Secondary | ICD-10-CM | POA: Diagnosis not present

## 2016-03-07 DIAGNOSIS — N2581 Secondary hyperparathyroidism of renal origin: Secondary | ICD-10-CM | POA: Diagnosis not present

## 2016-03-07 DIAGNOSIS — N186 End stage renal disease: Secondary | ICD-10-CM | POA: Diagnosis not present

## 2016-03-09 DIAGNOSIS — D631 Anemia in chronic kidney disease: Secondary | ICD-10-CM | POA: Diagnosis not present

## 2016-03-09 DIAGNOSIS — M3210 Systemic lupus erythematosus, organ or system involvement unspecified: Secondary | ICD-10-CM | POA: Diagnosis not present

## 2016-03-09 DIAGNOSIS — N2581 Secondary hyperparathyroidism of renal origin: Secondary | ICD-10-CM | POA: Diagnosis not present

## 2016-03-09 DIAGNOSIS — D509 Iron deficiency anemia, unspecified: Secondary | ICD-10-CM | POA: Diagnosis not present

## 2016-03-09 DIAGNOSIS — N186 End stage renal disease: Secondary | ICD-10-CM | POA: Diagnosis not present

## 2016-03-12 DIAGNOSIS — D631 Anemia in chronic kidney disease: Secondary | ICD-10-CM | POA: Diagnosis not present

## 2016-03-12 DIAGNOSIS — D509 Iron deficiency anemia, unspecified: Secondary | ICD-10-CM | POA: Diagnosis not present

## 2016-03-12 DIAGNOSIS — N2581 Secondary hyperparathyroidism of renal origin: Secondary | ICD-10-CM | POA: Diagnosis not present

## 2016-03-12 DIAGNOSIS — N186 End stage renal disease: Secondary | ICD-10-CM | POA: Diagnosis not present

## 2016-03-12 DIAGNOSIS — M3210 Systemic lupus erythematosus, organ or system involvement unspecified: Secondary | ICD-10-CM | POA: Diagnosis not present

## 2016-03-14 DIAGNOSIS — D631 Anemia in chronic kidney disease: Secondary | ICD-10-CM | POA: Diagnosis not present

## 2016-03-14 DIAGNOSIS — D509 Iron deficiency anemia, unspecified: Secondary | ICD-10-CM | POA: Diagnosis not present

## 2016-03-14 DIAGNOSIS — N186 End stage renal disease: Secondary | ICD-10-CM | POA: Diagnosis not present

## 2016-03-14 DIAGNOSIS — N2581 Secondary hyperparathyroidism of renal origin: Secondary | ICD-10-CM | POA: Diagnosis not present

## 2016-03-14 DIAGNOSIS — M3210 Systemic lupus erythematosus, organ or system involvement unspecified: Secondary | ICD-10-CM | POA: Diagnosis not present

## 2016-03-15 ENCOUNTER — Ambulatory Visit
Admission: RE | Admit: 2016-03-15 | Discharge: 2016-03-15 | Disposition: A | Discharge status: Home / Self Care | Payer: Medicare Other | Source: Ambulatory Visit | Attending: Internal Medicine | Admitting: Internal Medicine

## 2016-03-15 DIAGNOSIS — Z1231 Encounter for screening mammogram for malignant neoplasm of breast: Secondary | ICD-10-CM

## 2016-03-16 DIAGNOSIS — D509 Iron deficiency anemia, unspecified: Secondary | ICD-10-CM | POA: Diagnosis not present

## 2016-03-16 DIAGNOSIS — N2581 Secondary hyperparathyroidism of renal origin: Secondary | ICD-10-CM | POA: Diagnosis not present

## 2016-03-16 DIAGNOSIS — M3210 Systemic lupus erythematosus, organ or system involvement unspecified: Secondary | ICD-10-CM | POA: Diagnosis not present

## 2016-03-16 DIAGNOSIS — D631 Anemia in chronic kidney disease: Secondary | ICD-10-CM | POA: Diagnosis not present

## 2016-03-16 DIAGNOSIS — N186 End stage renal disease: Secondary | ICD-10-CM | POA: Diagnosis not present

## 2016-03-19 DIAGNOSIS — D631 Anemia in chronic kidney disease: Secondary | ICD-10-CM | POA: Diagnosis not present

## 2016-03-19 DIAGNOSIS — M3210 Systemic lupus erythematosus, organ or system involvement unspecified: Secondary | ICD-10-CM | POA: Diagnosis not present

## 2016-03-19 DIAGNOSIS — N186 End stage renal disease: Secondary | ICD-10-CM | POA: Diagnosis not present

## 2016-03-19 DIAGNOSIS — D509 Iron deficiency anemia, unspecified: Secondary | ICD-10-CM | POA: Diagnosis not present

## 2016-03-19 DIAGNOSIS — N2581 Secondary hyperparathyroidism of renal origin: Secondary | ICD-10-CM | POA: Diagnosis not present

## 2016-03-20 DIAGNOSIS — M25511 Pain in right shoulder: Secondary | ICD-10-CM | POA: Diagnosis not present

## 2016-03-20 DIAGNOSIS — Z992 Dependence on renal dialysis: Secondary | ICD-10-CM | POA: Diagnosis not present

## 2016-03-20 DIAGNOSIS — M75101 Unspecified rotator cuff tear or rupture of right shoulder, not specified as traumatic: Secondary | ICD-10-CM | POA: Diagnosis not present

## 2016-03-20 DIAGNOSIS — I120 Hypertensive chronic kidney disease with stage 5 chronic kidney disease or end stage renal disease: Secondary | ICD-10-CM | POA: Diagnosis not present

## 2016-03-20 DIAGNOSIS — N186 End stage renal disease: Secondary | ICD-10-CM | POA: Diagnosis not present

## 2016-03-21 DIAGNOSIS — M3210 Systemic lupus erythematosus, organ or system involvement unspecified: Secondary | ICD-10-CM | POA: Diagnosis not present

## 2016-03-21 DIAGNOSIS — D509 Iron deficiency anemia, unspecified: Secondary | ICD-10-CM | POA: Diagnosis not present

## 2016-03-21 DIAGNOSIS — N186 End stage renal disease: Secondary | ICD-10-CM | POA: Diagnosis not present

## 2016-03-21 DIAGNOSIS — D631 Anemia in chronic kidney disease: Secondary | ICD-10-CM | POA: Diagnosis not present

## 2016-03-21 DIAGNOSIS — N2581 Secondary hyperparathyroidism of renal origin: Secondary | ICD-10-CM | POA: Diagnosis not present

## 2016-03-22 DIAGNOSIS — E785 Hyperlipidemia, unspecified: Secondary | ICD-10-CM | POA: Diagnosis not present

## 2016-03-22 DIAGNOSIS — I10 Essential (primary) hypertension: Secondary | ICD-10-CM | POA: Diagnosis not present

## 2016-03-22 DIAGNOSIS — Z8673 Personal history of transient ischemic attack (TIA), and cerebral infarction without residual deficits: Secondary | ICD-10-CM | POA: Diagnosis not present

## 2016-03-22 DIAGNOSIS — Z992 Dependence on renal dialysis: Secondary | ICD-10-CM | POA: Diagnosis not present

## 2016-03-22 DIAGNOSIS — Z8719 Personal history of other diseases of the digestive system: Secondary | ICD-10-CM | POA: Diagnosis not present

## 2016-03-22 DIAGNOSIS — Z720 Tobacco use: Secondary | ICD-10-CM | POA: Diagnosis not present

## 2016-03-22 DIAGNOSIS — N186 End stage renal disease: Secondary | ICD-10-CM | POA: Diagnosis not present

## 2016-03-23 DIAGNOSIS — N186 End stage renal disease: Secondary | ICD-10-CM | POA: Diagnosis not present

## 2016-03-23 DIAGNOSIS — M3210 Systemic lupus erythematosus, organ or system involvement unspecified: Secondary | ICD-10-CM | POA: Diagnosis not present

## 2016-03-23 DIAGNOSIS — N2581 Secondary hyperparathyroidism of renal origin: Secondary | ICD-10-CM | POA: Diagnosis not present

## 2016-03-23 DIAGNOSIS — D509 Iron deficiency anemia, unspecified: Secondary | ICD-10-CM | POA: Diagnosis not present

## 2016-03-23 DIAGNOSIS — D631 Anemia in chronic kidney disease: Secondary | ICD-10-CM | POA: Diagnosis not present

## 2016-03-26 DIAGNOSIS — N2581 Secondary hyperparathyroidism of renal origin: Secondary | ICD-10-CM | POA: Diagnosis not present

## 2016-03-26 DIAGNOSIS — N186 End stage renal disease: Secondary | ICD-10-CM | POA: Diagnosis not present

## 2016-03-26 DIAGNOSIS — D631 Anemia in chronic kidney disease: Secondary | ICD-10-CM | POA: Diagnosis not present

## 2016-03-26 DIAGNOSIS — M3210 Systemic lupus erythematosus, organ or system involvement unspecified: Secondary | ICD-10-CM | POA: Diagnosis not present

## 2016-03-26 DIAGNOSIS — D509 Iron deficiency anemia, unspecified: Secondary | ICD-10-CM | POA: Diagnosis not present

## 2016-03-28 DIAGNOSIS — M3210 Systemic lupus erythematosus, organ or system involvement unspecified: Secondary | ICD-10-CM | POA: Diagnosis not present

## 2016-03-28 DIAGNOSIS — N2581 Secondary hyperparathyroidism of renal origin: Secondary | ICD-10-CM | POA: Diagnosis not present

## 2016-03-28 DIAGNOSIS — D631 Anemia in chronic kidney disease: Secondary | ICD-10-CM | POA: Diagnosis not present

## 2016-03-28 DIAGNOSIS — N186 End stage renal disease: Secondary | ICD-10-CM | POA: Diagnosis not present

## 2016-03-28 DIAGNOSIS — D509 Iron deficiency anemia, unspecified: Secondary | ICD-10-CM | POA: Diagnosis not present

## 2016-03-30 DIAGNOSIS — M3210 Systemic lupus erythematosus, organ or system involvement unspecified: Secondary | ICD-10-CM | POA: Diagnosis not present

## 2016-03-30 DIAGNOSIS — D509 Iron deficiency anemia, unspecified: Secondary | ICD-10-CM | POA: Diagnosis not present

## 2016-03-30 DIAGNOSIS — D631 Anemia in chronic kidney disease: Secondary | ICD-10-CM | POA: Diagnosis not present

## 2016-03-30 DIAGNOSIS — N2581 Secondary hyperparathyroidism of renal origin: Secondary | ICD-10-CM | POA: Diagnosis not present

## 2016-03-30 DIAGNOSIS — N186 End stage renal disease: Secondary | ICD-10-CM | POA: Diagnosis not present

## 2016-04-02 DIAGNOSIS — D631 Anemia in chronic kidney disease: Secondary | ICD-10-CM | POA: Diagnosis not present

## 2016-04-02 DIAGNOSIS — N2581 Secondary hyperparathyroidism of renal origin: Secondary | ICD-10-CM | POA: Diagnosis not present

## 2016-04-02 DIAGNOSIS — D509 Iron deficiency anemia, unspecified: Secondary | ICD-10-CM | POA: Diagnosis not present

## 2016-04-02 DIAGNOSIS — N186 End stage renal disease: Secondary | ICD-10-CM | POA: Diagnosis not present

## 2016-04-02 DIAGNOSIS — M3210 Systemic lupus erythematosus, organ or system involvement unspecified: Secondary | ICD-10-CM | POA: Diagnosis not present

## 2016-04-04 DIAGNOSIS — N186 End stage renal disease: Secondary | ICD-10-CM | POA: Diagnosis not present

## 2016-04-04 DIAGNOSIS — M3210 Systemic lupus erythematosus, organ or system involvement unspecified: Secondary | ICD-10-CM | POA: Diagnosis not present

## 2016-04-04 DIAGNOSIS — D631 Anemia in chronic kidney disease: Secondary | ICD-10-CM | POA: Diagnosis not present

## 2016-04-04 DIAGNOSIS — N2581 Secondary hyperparathyroidism of renal origin: Secondary | ICD-10-CM | POA: Diagnosis not present

## 2016-04-04 DIAGNOSIS — D509 Iron deficiency anemia, unspecified: Secondary | ICD-10-CM | POA: Diagnosis not present

## 2016-04-06 DIAGNOSIS — N186 End stage renal disease: Secondary | ICD-10-CM | POA: Diagnosis not present

## 2016-04-06 DIAGNOSIS — D509 Iron deficiency anemia, unspecified: Secondary | ICD-10-CM | POA: Diagnosis not present

## 2016-04-06 DIAGNOSIS — D631 Anemia in chronic kidney disease: Secondary | ICD-10-CM | POA: Diagnosis not present

## 2016-04-06 DIAGNOSIS — M3210 Systemic lupus erythematosus, organ or system involvement unspecified: Secondary | ICD-10-CM | POA: Diagnosis not present

## 2016-04-06 DIAGNOSIS — N2581 Secondary hyperparathyroidism of renal origin: Secondary | ICD-10-CM | POA: Diagnosis not present

## 2016-04-08 DIAGNOSIS — D631 Anemia in chronic kidney disease: Secondary | ICD-10-CM | POA: Diagnosis not present

## 2016-04-08 DIAGNOSIS — N186 End stage renal disease: Secondary | ICD-10-CM | POA: Diagnosis not present

## 2016-04-08 DIAGNOSIS — M3210 Systemic lupus erythematosus, organ or system involvement unspecified: Secondary | ICD-10-CM | POA: Diagnosis not present

## 2016-04-08 DIAGNOSIS — D509 Iron deficiency anemia, unspecified: Secondary | ICD-10-CM | POA: Diagnosis not present

## 2016-04-08 DIAGNOSIS — N2581 Secondary hyperparathyroidism of renal origin: Secondary | ICD-10-CM | POA: Diagnosis not present

## 2016-04-10 DIAGNOSIS — D509 Iron deficiency anemia, unspecified: Secondary | ICD-10-CM | POA: Diagnosis not present

## 2016-04-10 DIAGNOSIS — N2581 Secondary hyperparathyroidism of renal origin: Secondary | ICD-10-CM | POA: Diagnosis not present

## 2016-04-10 DIAGNOSIS — N186 End stage renal disease: Secondary | ICD-10-CM | POA: Diagnosis not present

## 2016-04-10 DIAGNOSIS — M3210 Systemic lupus erythematosus, organ or system involvement unspecified: Secondary | ICD-10-CM | POA: Diagnosis not present

## 2016-04-10 DIAGNOSIS — D631 Anemia in chronic kidney disease: Secondary | ICD-10-CM | POA: Diagnosis not present

## 2016-04-13 DIAGNOSIS — N2581 Secondary hyperparathyroidism of renal origin: Secondary | ICD-10-CM | POA: Diagnosis not present

## 2016-04-13 DIAGNOSIS — D631 Anemia in chronic kidney disease: Secondary | ICD-10-CM | POA: Diagnosis not present

## 2016-04-13 DIAGNOSIS — N186 End stage renal disease: Secondary | ICD-10-CM | POA: Diagnosis not present

## 2016-04-13 DIAGNOSIS — M3210 Systemic lupus erythematosus, organ or system involvement unspecified: Secondary | ICD-10-CM | POA: Diagnosis not present

## 2016-04-13 DIAGNOSIS — D509 Iron deficiency anemia, unspecified: Secondary | ICD-10-CM | POA: Diagnosis not present

## 2016-04-16 DIAGNOSIS — D631 Anemia in chronic kidney disease: Secondary | ICD-10-CM | POA: Diagnosis not present

## 2016-04-16 DIAGNOSIS — M3210 Systemic lupus erythematosus, organ or system involvement unspecified: Secondary | ICD-10-CM | POA: Diagnosis not present

## 2016-04-16 DIAGNOSIS — D509 Iron deficiency anemia, unspecified: Secondary | ICD-10-CM | POA: Diagnosis not present

## 2016-04-16 DIAGNOSIS — N186 End stage renal disease: Secondary | ICD-10-CM | POA: Diagnosis not present

## 2016-04-16 DIAGNOSIS — N2581 Secondary hyperparathyroidism of renal origin: Secondary | ICD-10-CM | POA: Diagnosis not present

## 2016-04-18 DIAGNOSIS — M3210 Systemic lupus erythematosus, organ or system involvement unspecified: Secondary | ICD-10-CM | POA: Diagnosis not present

## 2016-04-18 DIAGNOSIS — D509 Iron deficiency anemia, unspecified: Secondary | ICD-10-CM | POA: Diagnosis not present

## 2016-04-18 DIAGNOSIS — N186 End stage renal disease: Secondary | ICD-10-CM | POA: Diagnosis not present

## 2016-04-18 DIAGNOSIS — D631 Anemia in chronic kidney disease: Secondary | ICD-10-CM | POA: Diagnosis not present

## 2016-04-18 DIAGNOSIS — N2581 Secondary hyperparathyroidism of renal origin: Secondary | ICD-10-CM | POA: Diagnosis not present

## 2016-04-19 DIAGNOSIS — I120 Hypertensive chronic kidney disease with stage 5 chronic kidney disease or end stage renal disease: Secondary | ICD-10-CM | POA: Diagnosis not present

## 2016-04-19 DIAGNOSIS — N186 End stage renal disease: Secondary | ICD-10-CM | POA: Diagnosis not present

## 2016-04-19 DIAGNOSIS — Z992 Dependence on renal dialysis: Secondary | ICD-10-CM | POA: Diagnosis not present

## 2016-04-20 DIAGNOSIS — N186 End stage renal disease: Secondary | ICD-10-CM | POA: Diagnosis not present

## 2016-04-20 DIAGNOSIS — D509 Iron deficiency anemia, unspecified: Secondary | ICD-10-CM | POA: Diagnosis not present

## 2016-04-20 DIAGNOSIS — D631 Anemia in chronic kidney disease: Secondary | ICD-10-CM | POA: Diagnosis not present

## 2016-04-20 DIAGNOSIS — M3210 Systemic lupus erythematosus, organ or system involvement unspecified: Secondary | ICD-10-CM | POA: Diagnosis not present

## 2016-04-20 DIAGNOSIS — N2581 Secondary hyperparathyroidism of renal origin: Secondary | ICD-10-CM | POA: Diagnosis not present

## 2016-04-23 DIAGNOSIS — N2581 Secondary hyperparathyroidism of renal origin: Secondary | ICD-10-CM | POA: Diagnosis not present

## 2016-04-23 DIAGNOSIS — N186 End stage renal disease: Secondary | ICD-10-CM | POA: Diagnosis not present

## 2016-04-23 DIAGNOSIS — M3210 Systemic lupus erythematosus, organ or system involvement unspecified: Secondary | ICD-10-CM | POA: Diagnosis not present

## 2016-04-23 DIAGNOSIS — D509 Iron deficiency anemia, unspecified: Secondary | ICD-10-CM | POA: Diagnosis not present

## 2016-04-23 DIAGNOSIS — D631 Anemia in chronic kidney disease: Secondary | ICD-10-CM | POA: Diagnosis not present

## 2016-04-24 ENCOUNTER — Other Ambulatory Visit (HOSPITAL_COMMUNITY)
Admission: RE | Admit: 2016-04-24 | Discharge: 2016-04-24 | Disposition: A | Discharge status: Home / Self Care | Payer: Medicare Other | Source: Ambulatory Visit | Attending: Obstetrics | Admitting: Obstetrics

## 2016-04-24 ENCOUNTER — Encounter: Payer: Self-pay | Admitting: Obstetrics

## 2016-04-24 ENCOUNTER — Ambulatory Visit (INDEPENDENT_AMBULATORY_CARE_PROVIDER_SITE_OTHER): Payer: Medicare Other | Admitting: Obstetrics

## 2016-04-24 VITALS — BP 181/92 | HR 81 | Temp 98.3°F | Wt 133.9 lb

## 2016-04-24 DIAGNOSIS — Z780 Asymptomatic menopausal state: Secondary | ICD-10-CM

## 2016-04-24 DIAGNOSIS — Z01419 Encounter for gynecological examination (general) (routine) without abnormal findings: Secondary | ICD-10-CM

## 2016-04-24 DIAGNOSIS — N898 Other specified noninflammatory disorders of vagina: Secondary | ICD-10-CM | POA: Diagnosis not present

## 2016-04-24 DIAGNOSIS — Z1151 Encounter for screening for human papillomavirus (HPV): Secondary | ICD-10-CM | POA: Diagnosis not present

## 2016-04-24 DIAGNOSIS — Z124 Encounter for screening for malignant neoplasm of cervix: Secondary | ICD-10-CM

## 2016-04-24 NOTE — Progress Notes (Signed)
Subjective:        [name] is a 55 y.o. female here for a routine exam.  Current complaints: None.    Personal health questionnaire:  Is patient [name], have a family history of breast and/or ovarian cancer: no Is there a family history of uterine cancer diagnosed at age < 63, gastrointestinal cancer, urinary tract cancer, family member who is a Field seismologist syndrome-associated carrier: no Is the patient overweight and hypertensive, family history of diabetes, personal history of gestational diabetes, preeclampsia or PCOS: no Is patient over 101, have PCOS,  family history of premature CHD under age 70, diabetes, smoke, have hypertension or peripheral artery disease:  no At any time, has a partner hit, kicked or otherwise hurt or frightened you?: no Over the past 2 weeks, have you felt down, depressed or hopeless?: no Over the past 2 weeks, have you felt little interest or pleasure in doing things?:no   Gynecologic History Patient's last menstrual period was 12/05/2010. Contraception: post menopausal status Last Pap: 2016. Results were: normal Last mammogram: 2016. Results were: normal  Obstetric History OB History  No data available    Past Medical History:  Diagnosis Date  . Anemia   . Blood transfusion '08   [facility]  . Chronic diastolic CHF (congestive heart failure) ([place])   . Dysfunctional uterine bleeding 12/19/2010  . ESRD (end stage renal disease) ([place])    dialysis   Mon Wed Fri  . GERD (gastroesophageal reflux disease)   . Hemodialysis patient ([place])   . Hx of cardiovascular stress test    Lexiscan Myoview 4/16:  Normal stress nuclear study, EF 59%  . Hx of hiatal hernia   . Hypertension   . Lupus   . Mitral stenosis    Echo 4/16:  EF 55-60%, no RWMA, Gr 1 DD, mod MS (mean 9 mmHg), mod LAE, mild RAE, PASP 65, mod to severe TR, trivial eff  . Peptic ulcer disease     Past Surgical History:  Procedure Laterality Date  . DIALYSIS FISTULA CREATION  2007  .  ESOPHAGOGASTRODUODENOSCOPY N/A 07/31/2014   Procedure: ESOPHAGOGASTRODUODENOSCOPY (EGD);  Surgeon: Clarene Essex, MD;  Location: [facility] ENDOSCOPY;  Service: Endoscopy;  Laterality: N/A;     Current Outpatient Prescriptions:  .  amLODipine (NORVASC) 10 MG tablet, Take 10 mg by mouth daily.  , Disp: , Rfl:  .  aspirin 81 MG chewable tablet, Chew 1 tablet (81 mg total) by mouth daily., Disp: , Rfl:  .  b complex-vitamin c-folic acid (NEPHRO-VITE) 0.8 MG TABS tablet, Take 0.8 mg by mouth at bedtime., Disp: , Rfl:  .  calcitRIOL (ROCALTROL) 0.5 MCG capsule, Take 3 capsules (1.5 mcg total) by mouth every Monday, Wednesday, and Friday with hemodialysis., Disp: 12 capsule, Rfl: 0 .  cloNIDine (CATAPRES) 0.1 MG tablet, Take 1 tablet (0.1 mg total) by mouth 2 (two) times daily., Disp: 180 tablet, Rfl: 0 .  labetalol (NORMODYNE) 300 MG tablet, Take 300 mg by mouth 3 (three) times daily., Disp: , Rfl:  .  lisinopril (PRINIVIL,ZESTRIL) 40 MG tablet, Take 40 mg by mouth daily. , Disp: , Rfl: 3 .  ondansetron (ZOFRAN ODT) 8 MG disintegrating tablet, Take 1 tablet (8 mg total) by mouth every 8 (eight) hours as needed for nausea or vomiting., Disp: 12 tablet, Rfl: 0 .  ondansetron (ZOFRAN) 4 MG tablet, Take 1 tablet (4 mg total) by mouth every 6 (six) hours as needed for nausea., Disp: 20 tablet, Rfl: 0 .  pantoprazole (PROTONIX) 40 MG tablet, Take 1 tablet (40 mg total) by mouth 2 (two) times daily., Disp: 60 tablet, Rfl: 0 .  atorvastatin (LIPITOR) 40 MG tablet, Take 1 tablet (40 mg total) by mouth daily. (Patient not taking: Reported on 04/24/2016), Disp: 90 tablet, Rfl: 3 .  lanthanum (FOSRENOL) 1000 MG chewable tablet, Chew 2 tablets (2,000 mg total) by mouth 3 (three) times daily with meals. (Patient not taking: Reported on 04/24/2016), Disp: 180 tablet, Rfl: 0 No Known Allergies  Social History  Substance Use Topics  . Smoking status: Current Some Day Smoker    Packs/day: 0.25    Types: Cigarettes  . Smokeless  tobacco: Never Used  . Alcohol use 0.0 oz/week     Comment: occasional    Family History  Problem Relation Age of Onset  . Liver cancer Maternal Grandmother   . Lymphoma Maternal Aunt   . Heart attack Neg Hx       Review of Systems  Constitutional: negative for fatigue and weight loss Respiratory: negative for cough and wheezing Cardiovascular: negative for chest pain, fatigue and palpitations Gastrointestinal: negative for abdominal pain and change in bowel habits Musculoskeletal:negative for myalgias Neurological: negative for gait problems and tremors Behavioral/Psych: negative for abusive relationship, depression Endocrine: negative for temperature intolerance    Genitourinary:negative for abnormal menstrual periods, genital lesions, hot flashes, sexual problems and vaginal discharge Integument/breast: negative for breast lump, breast tenderness, nipple discharge and skin lesion(s)    Objective:       BP (!) 181/92   Pulse 81   Temp 98.3 F (36.8 C) (Oral)   Wt 133 lb 14.4 oz (60.7 kg)   LMP 12/05/2010   BMI 21.61 kg/m  General:   alert  Skin:   no rash or abnormalities  Lungs:   clear to auscultation bilaterally  Heart:   regular rate and rhythm, S1, S2 normal, no murmur, click, rub or gallop  Breasts:   normal without suspicious masses, skin or nipple changes or axillary nodes  Abdomen:  normal findings: no organomegaly, soft, non-tender and no hernia  Pelvis:  External genitalia: normal general appearance Urinary system: urethral meatus normal and bladder without fullness, nontender Vaginal: normal without tenderness, induration or masses Cervix: normal appearance Adnexa: normal bimanual exam Uterus: anteverted and non-tender, normal size   Lab Review Urine pregnancy test Labs reviewed yes Radiologic studies reviewed yes  50% of 20 min visit spent on counseling and coordination of care.    Assessment:    Healthy female exam.    Plan:    Education  reviewed: calcium supplements, low fat, low cholesterol diet, self breast exams and weight bearing exercise. Follow up in: 1 year.   No orders of the defined types were placed in this encounter.  No orders of the defined types were placed in this encounter.    Patient ID: [name], female   DOB: 1961-02-23, 55 y.o.   MRN: [id number]

## 2016-04-25 DIAGNOSIS — M3210 Systemic lupus erythematosus, organ or system involvement unspecified: Secondary | ICD-10-CM | POA: Diagnosis not present

## 2016-04-25 DIAGNOSIS — N186 End stage renal disease: Secondary | ICD-10-CM | POA: Diagnosis not present

## 2016-04-25 DIAGNOSIS — N2581 Secondary hyperparathyroidism of renal origin: Secondary | ICD-10-CM | POA: Diagnosis not present

## 2016-04-25 DIAGNOSIS — D509 Iron deficiency anemia, unspecified: Secondary | ICD-10-CM | POA: Diagnosis not present

## 2016-04-25 DIAGNOSIS — D631 Anemia in chronic kidney disease: Secondary | ICD-10-CM | POA: Diagnosis not present

## 2016-04-27 ENCOUNTER — Other Ambulatory Visit: Payer: Self-pay | Admitting: Obstetrics

## 2016-04-27 DIAGNOSIS — D509 Iron deficiency anemia, unspecified: Secondary | ICD-10-CM | POA: Diagnosis not present

## 2016-04-27 DIAGNOSIS — B9689 Other specified bacterial agents as the cause of diseases classified elsewhere: Secondary | ICD-10-CM

## 2016-04-27 DIAGNOSIS — D631 Anemia in chronic kidney disease: Secondary | ICD-10-CM | POA: Diagnosis not present

## 2016-04-27 DIAGNOSIS — N760 Acute vaginitis: Principal | ICD-10-CM

## 2016-04-27 DIAGNOSIS — M3210 Systemic lupus erythematosus, organ or system involvement unspecified: Secondary | ICD-10-CM | POA: Diagnosis not present

## 2016-04-27 DIAGNOSIS — N186 End stage renal disease: Secondary | ICD-10-CM | POA: Diagnosis not present

## 2016-04-27 DIAGNOSIS — N2581 Secondary hyperparathyroidism of renal origin: Secondary | ICD-10-CM | POA: Diagnosis not present

## 2016-04-27 LAB — NUSWAB BV AND CANDIDA, NAA
ATOPOBIUM VAGINAE: HIGH {score} — AB
CANDIDA GLABRATA, NAA: NEGATIVE
Candida albicans, NAA: NEGATIVE

## 2016-04-27 LAB — CYTOLOGY - PAP
Diagnosis: NEGATIVE
HPV (WINDOPATH): DETECTED — AB

## 2016-04-27 MED ORDER — METRONIDAZOLE 500 MG PO TABS: 500.0000 mg | ORAL_TABLET | Freq: Two times a day (BID) | ORAL | 2 refills | Status: DC

## 2016-04-30 DIAGNOSIS — N2581 Secondary hyperparathyroidism of renal origin: Secondary | ICD-10-CM | POA: Diagnosis not present

## 2016-04-30 DIAGNOSIS — N186 End stage renal disease: Secondary | ICD-10-CM | POA: Diagnosis not present

## 2016-04-30 DIAGNOSIS — M3210 Systemic lupus erythematosus, organ or system involvement unspecified: Secondary | ICD-10-CM | POA: Diagnosis not present

## 2016-04-30 DIAGNOSIS — D509 Iron deficiency anemia, unspecified: Secondary | ICD-10-CM | POA: Diagnosis not present

## 2016-04-30 DIAGNOSIS — D631 Anemia in chronic kidney disease: Secondary | ICD-10-CM | POA: Diagnosis not present

## 2016-05-03 DIAGNOSIS — D509 Iron deficiency anemia, unspecified: Secondary | ICD-10-CM | POA: Diagnosis not present

## 2016-05-03 DIAGNOSIS — N2581 Secondary hyperparathyroidism of renal origin: Secondary | ICD-10-CM | POA: Diagnosis not present

## 2016-05-03 DIAGNOSIS — M3210 Systemic lupus erythematosus, organ or system involvement unspecified: Secondary | ICD-10-CM | POA: Diagnosis not present

## 2016-05-03 DIAGNOSIS — N186 End stage renal disease: Secondary | ICD-10-CM | POA: Diagnosis not present

## 2016-05-03 DIAGNOSIS — D631 Anemia in chronic kidney disease: Secondary | ICD-10-CM | POA: Diagnosis not present

## 2016-05-04 DIAGNOSIS — N186 End stage renal disease: Secondary | ICD-10-CM | POA: Diagnosis not present

## 2016-05-04 DIAGNOSIS — M3210 Systemic lupus erythematosus, organ or system involvement unspecified: Secondary | ICD-10-CM | POA: Diagnosis not present

## 2016-05-04 DIAGNOSIS — N2581 Secondary hyperparathyroidism of renal origin: Secondary | ICD-10-CM | POA: Diagnosis not present

## 2016-05-04 DIAGNOSIS — D509 Iron deficiency anemia, unspecified: Secondary | ICD-10-CM | POA: Diagnosis not present

## 2016-05-04 DIAGNOSIS — D631 Anemia in chronic kidney disease: Secondary | ICD-10-CM | POA: Diagnosis not present

## 2016-05-07 DIAGNOSIS — D509 Iron deficiency anemia, unspecified: Secondary | ICD-10-CM | POA: Diagnosis not present

## 2016-05-07 DIAGNOSIS — N2581 Secondary hyperparathyroidism of renal origin: Secondary | ICD-10-CM | POA: Diagnosis not present

## 2016-05-07 DIAGNOSIS — D631 Anemia in chronic kidney disease: Secondary | ICD-10-CM | POA: Diagnosis not present

## 2016-05-07 DIAGNOSIS — M3210 Systemic lupus erythematosus, organ or system involvement unspecified: Secondary | ICD-10-CM | POA: Diagnosis not present

## 2016-05-07 DIAGNOSIS — N186 End stage renal disease: Secondary | ICD-10-CM | POA: Diagnosis not present

## 2016-05-09 DIAGNOSIS — N186 End stage renal disease: Secondary | ICD-10-CM | POA: Diagnosis not present

## 2016-05-09 DIAGNOSIS — M3210 Systemic lupus erythematosus, organ or system involvement unspecified: Secondary | ICD-10-CM | POA: Diagnosis not present

## 2016-05-09 DIAGNOSIS — D509 Iron deficiency anemia, unspecified: Secondary | ICD-10-CM | POA: Diagnosis not present

## 2016-05-09 DIAGNOSIS — D631 Anemia in chronic kidney disease: Secondary | ICD-10-CM | POA: Diagnosis not present

## 2016-05-09 DIAGNOSIS — N2581 Secondary hyperparathyroidism of renal origin: Secondary | ICD-10-CM | POA: Diagnosis not present

## 2016-05-11 DIAGNOSIS — M3210 Systemic lupus erythematosus, organ or system involvement unspecified: Secondary | ICD-10-CM | POA: Diagnosis not present

## 2016-05-11 DIAGNOSIS — D509 Iron deficiency anemia, unspecified: Secondary | ICD-10-CM | POA: Diagnosis not present

## 2016-05-11 DIAGNOSIS — D631 Anemia in chronic kidney disease: Secondary | ICD-10-CM | POA: Diagnosis not present

## 2016-05-11 DIAGNOSIS — N2581 Secondary hyperparathyroidism of renal origin: Secondary | ICD-10-CM | POA: Diagnosis not present

## 2016-05-11 DIAGNOSIS — N186 End stage renal disease: Secondary | ICD-10-CM | POA: Diagnosis not present

## 2016-05-13 DIAGNOSIS — N2581 Secondary hyperparathyroidism of renal origin: Secondary | ICD-10-CM | POA: Diagnosis not present

## 2016-05-13 DIAGNOSIS — N186 End stage renal disease: Secondary | ICD-10-CM | POA: Diagnosis not present

## 2016-05-13 DIAGNOSIS — D631 Anemia in chronic kidney disease: Secondary | ICD-10-CM | POA: Diagnosis not present

## 2016-05-13 DIAGNOSIS — D509 Iron deficiency anemia, unspecified: Secondary | ICD-10-CM | POA: Diagnosis not present

## 2016-05-13 DIAGNOSIS — M3210 Systemic lupus erythematosus, organ or system involvement unspecified: Secondary | ICD-10-CM | POA: Diagnosis not present

## 2016-05-16 DIAGNOSIS — M3210 Systemic lupus erythematosus, organ or system involvement unspecified: Secondary | ICD-10-CM | POA: Diagnosis not present

## 2016-05-16 DIAGNOSIS — D631 Anemia in chronic kidney disease: Secondary | ICD-10-CM | POA: Diagnosis not present

## 2016-05-16 DIAGNOSIS — D509 Iron deficiency anemia, unspecified: Secondary | ICD-10-CM | POA: Diagnosis not present

## 2016-05-16 DIAGNOSIS — N2581 Secondary hyperparathyroidism of renal origin: Secondary | ICD-10-CM | POA: Diagnosis not present

## 2016-05-16 DIAGNOSIS — N186 End stage renal disease: Secondary | ICD-10-CM | POA: Diagnosis not present

## 2016-05-17 ENCOUNTER — Encounter: Payer: Self-pay | Admitting: *Deleted

## 2016-05-18 DIAGNOSIS — N2581 Secondary hyperparathyroidism of renal origin: Secondary | ICD-10-CM | POA: Diagnosis not present

## 2016-05-18 DIAGNOSIS — D631 Anemia in chronic kidney disease: Secondary | ICD-10-CM | POA: Diagnosis not present

## 2016-05-18 DIAGNOSIS — M3210 Systemic lupus erythematosus, organ or system involvement unspecified: Secondary | ICD-10-CM | POA: Diagnosis not present

## 2016-05-18 DIAGNOSIS — N186 End stage renal disease: Secondary | ICD-10-CM | POA: Diagnosis not present

## 2016-05-18 DIAGNOSIS — D509 Iron deficiency anemia, unspecified: Secondary | ICD-10-CM | POA: Diagnosis not present

## 2016-05-20 DIAGNOSIS — N2581 Secondary hyperparathyroidism of renal origin: Secondary | ICD-10-CM | POA: Diagnosis not present

## 2016-05-20 DIAGNOSIS — D509 Iron deficiency anemia, unspecified: Secondary | ICD-10-CM | POA: Diagnosis not present

## 2016-05-20 DIAGNOSIS — D631 Anemia in chronic kidney disease: Secondary | ICD-10-CM | POA: Diagnosis not present

## 2016-05-20 DIAGNOSIS — Z992 Dependence on renal dialysis: Secondary | ICD-10-CM | POA: Diagnosis not present

## 2016-05-20 DIAGNOSIS — M3210 Systemic lupus erythematosus, organ or system involvement unspecified: Secondary | ICD-10-CM | POA: Diagnosis not present

## 2016-05-20 DIAGNOSIS — N186 End stage renal disease: Secondary | ICD-10-CM | POA: Diagnosis not present

## 2016-05-20 DIAGNOSIS — I120 Hypertensive chronic kidney disease with stage 5 chronic kidney disease or end stage renal disease: Secondary | ICD-10-CM | POA: Diagnosis not present

## 2016-05-23 DIAGNOSIS — D631 Anemia in chronic kidney disease: Secondary | ICD-10-CM | POA: Diagnosis not present

## 2016-05-23 DIAGNOSIS — M3210 Systemic lupus erythematosus, organ or system involvement unspecified: Secondary | ICD-10-CM | POA: Diagnosis not present

## 2016-05-23 DIAGNOSIS — R7989 Other specified abnormal findings of blood chemistry: Secondary | ICD-10-CM | POA: Diagnosis not present

## 2016-05-23 DIAGNOSIS — D509 Iron deficiency anemia, unspecified: Secondary | ICD-10-CM | POA: Diagnosis not present

## 2016-05-23 DIAGNOSIS — N2581 Secondary hyperparathyroidism of renal origin: Secondary | ICD-10-CM | POA: Diagnosis not present

## 2016-05-23 DIAGNOSIS — N186 End stage renal disease: Secondary | ICD-10-CM | POA: Diagnosis not present

## 2016-05-25 DIAGNOSIS — D509 Iron deficiency anemia, unspecified: Secondary | ICD-10-CM | POA: Diagnosis not present

## 2016-05-25 DIAGNOSIS — D631 Anemia in chronic kidney disease: Secondary | ICD-10-CM | POA: Diagnosis not present

## 2016-05-25 DIAGNOSIS — R7989 Other specified abnormal findings of blood chemistry: Secondary | ICD-10-CM | POA: Diagnosis not present

## 2016-05-25 DIAGNOSIS — M3210 Systemic lupus erythematosus, organ or system involvement unspecified: Secondary | ICD-10-CM | POA: Diagnosis not present

## 2016-05-25 DIAGNOSIS — N186 End stage renal disease: Secondary | ICD-10-CM | POA: Diagnosis not present

## 2016-05-25 DIAGNOSIS — N2581 Secondary hyperparathyroidism of renal origin: Secondary | ICD-10-CM | POA: Diagnosis not present

## 2016-05-28 DIAGNOSIS — N2581 Secondary hyperparathyroidism of renal origin: Secondary | ICD-10-CM | POA: Diagnosis not present

## 2016-05-28 DIAGNOSIS — R7989 Other specified abnormal findings of blood chemistry: Secondary | ICD-10-CM | POA: Diagnosis not present

## 2016-05-28 DIAGNOSIS — M3210 Systemic lupus erythematosus, organ or system involvement unspecified: Secondary | ICD-10-CM | POA: Diagnosis not present

## 2016-05-28 DIAGNOSIS — D509 Iron deficiency anemia, unspecified: Secondary | ICD-10-CM | POA: Diagnosis not present

## 2016-05-28 DIAGNOSIS — D631 Anemia in chronic kidney disease: Secondary | ICD-10-CM | POA: Diagnosis not present

## 2016-05-28 DIAGNOSIS — N186 End stage renal disease: Secondary | ICD-10-CM | POA: Diagnosis not present

## 2016-05-30 DIAGNOSIS — N186 End stage renal disease: Secondary | ICD-10-CM | POA: Diagnosis not present

## 2016-05-30 DIAGNOSIS — M3210 Systemic lupus erythematosus, organ or system involvement unspecified: Secondary | ICD-10-CM | POA: Diagnosis not present

## 2016-05-30 DIAGNOSIS — R7989 Other specified abnormal findings of blood chemistry: Secondary | ICD-10-CM | POA: Diagnosis not present

## 2016-05-30 DIAGNOSIS — N2581 Secondary hyperparathyroidism of renal origin: Secondary | ICD-10-CM | POA: Diagnosis not present

## 2016-05-30 DIAGNOSIS — D631 Anemia in chronic kidney disease: Secondary | ICD-10-CM | POA: Diagnosis not present

## 2016-05-30 DIAGNOSIS — D509 Iron deficiency anemia, unspecified: Secondary | ICD-10-CM | POA: Diagnosis not present

## 2016-06-01 DIAGNOSIS — M3210 Systemic lupus erythematosus, organ or system involvement unspecified: Secondary | ICD-10-CM | POA: Diagnosis not present

## 2016-06-01 DIAGNOSIS — D631 Anemia in chronic kidney disease: Secondary | ICD-10-CM | POA: Diagnosis not present

## 2016-06-01 DIAGNOSIS — R7989 Other specified abnormal findings of blood chemistry: Secondary | ICD-10-CM | POA: Diagnosis not present

## 2016-06-01 DIAGNOSIS — N186 End stage renal disease: Secondary | ICD-10-CM | POA: Diagnosis not present

## 2016-06-01 DIAGNOSIS — N2581 Secondary hyperparathyroidism of renal origin: Secondary | ICD-10-CM | POA: Diagnosis not present

## 2016-06-01 DIAGNOSIS — D509 Iron deficiency anemia, unspecified: Secondary | ICD-10-CM | POA: Diagnosis not present

## 2016-06-04 DIAGNOSIS — R7989 Other specified abnormal findings of blood chemistry: Secondary | ICD-10-CM | POA: Diagnosis not present

## 2016-06-04 DIAGNOSIS — N186 End stage renal disease: Secondary | ICD-10-CM | POA: Diagnosis not present

## 2016-06-04 DIAGNOSIS — N2581 Secondary hyperparathyroidism of renal origin: Secondary | ICD-10-CM | POA: Diagnosis not present

## 2016-06-04 DIAGNOSIS — D509 Iron deficiency anemia, unspecified: Secondary | ICD-10-CM | POA: Diagnosis not present

## 2016-06-04 DIAGNOSIS — M3210 Systemic lupus erythematosus, organ or system involvement unspecified: Secondary | ICD-10-CM | POA: Diagnosis not present

## 2016-06-04 DIAGNOSIS — D631 Anemia in chronic kidney disease: Secondary | ICD-10-CM | POA: Diagnosis not present

## 2016-06-07 DIAGNOSIS — N2581 Secondary hyperparathyroidism of renal origin: Secondary | ICD-10-CM | POA: Diagnosis not present

## 2016-06-07 DIAGNOSIS — N186 End stage renal disease: Secondary | ICD-10-CM | POA: Diagnosis not present

## 2016-06-07 DIAGNOSIS — D631 Anemia in chronic kidney disease: Secondary | ICD-10-CM | POA: Diagnosis not present

## 2016-06-07 DIAGNOSIS — D509 Iron deficiency anemia, unspecified: Secondary | ICD-10-CM | POA: Diagnosis not present

## 2016-06-07 DIAGNOSIS — R7989 Other specified abnormal findings of blood chemistry: Secondary | ICD-10-CM | POA: Diagnosis not present

## 2016-06-07 DIAGNOSIS — M3210 Systemic lupus erythematosus, organ or system involvement unspecified: Secondary | ICD-10-CM | POA: Diagnosis not present

## 2016-06-08 DIAGNOSIS — D631 Anemia in chronic kidney disease: Secondary | ICD-10-CM | POA: Diagnosis not present

## 2016-06-08 DIAGNOSIS — R7989 Other specified abnormal findings of blood chemistry: Secondary | ICD-10-CM | POA: Diagnosis not present

## 2016-06-08 DIAGNOSIS — M3210 Systemic lupus erythematosus, organ or system involvement unspecified: Secondary | ICD-10-CM | POA: Diagnosis not present

## 2016-06-08 DIAGNOSIS — N2581 Secondary hyperparathyroidism of renal origin: Secondary | ICD-10-CM | POA: Diagnosis not present

## 2016-06-08 DIAGNOSIS — N186 End stage renal disease: Secondary | ICD-10-CM | POA: Diagnosis not present

## 2016-06-08 DIAGNOSIS — D509 Iron deficiency anemia, unspecified: Secondary | ICD-10-CM | POA: Diagnosis not present

## 2016-06-11 DIAGNOSIS — N186 End stage renal disease: Secondary | ICD-10-CM | POA: Diagnosis not present

## 2016-06-11 DIAGNOSIS — M3210 Systemic lupus erythematosus, organ or system involvement unspecified: Secondary | ICD-10-CM | POA: Diagnosis not present

## 2016-06-11 DIAGNOSIS — N2581 Secondary hyperparathyroidism of renal origin: Secondary | ICD-10-CM | POA: Diagnosis not present

## 2016-06-11 DIAGNOSIS — R7989 Other specified abnormal findings of blood chemistry: Secondary | ICD-10-CM | POA: Diagnosis not present

## 2016-06-11 DIAGNOSIS — D631 Anemia in chronic kidney disease: Secondary | ICD-10-CM | POA: Diagnosis not present

## 2016-06-11 DIAGNOSIS — D509 Iron deficiency anemia, unspecified: Secondary | ICD-10-CM | POA: Diagnosis not present

## 2016-06-13 DIAGNOSIS — R7989 Other specified abnormal findings of blood chemistry: Secondary | ICD-10-CM | POA: Diagnosis not present

## 2016-06-13 DIAGNOSIS — N186 End stage renal disease: Secondary | ICD-10-CM | POA: Diagnosis not present

## 2016-06-13 DIAGNOSIS — N2581 Secondary hyperparathyroidism of renal origin: Secondary | ICD-10-CM | POA: Diagnosis not present

## 2016-06-13 DIAGNOSIS — M3210 Systemic lupus erythematosus, organ or system involvement unspecified: Secondary | ICD-10-CM | POA: Diagnosis not present

## 2016-06-13 DIAGNOSIS — D509 Iron deficiency anemia, unspecified: Secondary | ICD-10-CM | POA: Diagnosis not present

## 2016-06-13 DIAGNOSIS — D631 Anemia in chronic kidney disease: Secondary | ICD-10-CM | POA: Diagnosis not present

## 2016-06-15 DIAGNOSIS — R7989 Other specified abnormal findings of blood chemistry: Secondary | ICD-10-CM | POA: Diagnosis not present

## 2016-06-15 DIAGNOSIS — N186 End stage renal disease: Secondary | ICD-10-CM | POA: Diagnosis not present

## 2016-06-15 DIAGNOSIS — D509 Iron deficiency anemia, unspecified: Secondary | ICD-10-CM | POA: Diagnosis not present

## 2016-06-15 DIAGNOSIS — M3210 Systemic lupus erythematosus, organ or system involvement unspecified: Secondary | ICD-10-CM | POA: Diagnosis not present

## 2016-06-15 DIAGNOSIS — D631 Anemia in chronic kidney disease: Secondary | ICD-10-CM | POA: Diagnosis not present

## 2016-06-15 DIAGNOSIS — N2581 Secondary hyperparathyroidism of renal origin: Secondary | ICD-10-CM | POA: Diagnosis not present

## 2016-06-18 DIAGNOSIS — N186 End stage renal disease: Secondary | ICD-10-CM | POA: Diagnosis not present

## 2016-06-18 DIAGNOSIS — R7989 Other specified abnormal findings of blood chemistry: Secondary | ICD-10-CM | POA: Diagnosis not present

## 2016-06-18 DIAGNOSIS — D631 Anemia in chronic kidney disease: Secondary | ICD-10-CM | POA: Diagnosis not present

## 2016-06-18 DIAGNOSIS — N2581 Secondary hyperparathyroidism of renal origin: Secondary | ICD-10-CM | POA: Diagnosis not present

## 2016-06-18 DIAGNOSIS — D509 Iron deficiency anemia, unspecified: Secondary | ICD-10-CM | POA: Diagnosis not present

## 2016-06-18 DIAGNOSIS — M3210 Systemic lupus erythematosus, organ or system involvement unspecified: Secondary | ICD-10-CM | POA: Diagnosis not present

## 2016-06-20 DIAGNOSIS — N186 End stage renal disease: Secondary | ICD-10-CM | POA: Diagnosis not present

## 2016-06-20 DIAGNOSIS — Z992 Dependence on renal dialysis: Secondary | ICD-10-CM | POA: Diagnosis not present

## 2016-06-20 DIAGNOSIS — N2581 Secondary hyperparathyroidism of renal origin: Secondary | ICD-10-CM | POA: Diagnosis not present

## 2016-06-20 DIAGNOSIS — I120 Hypertensive chronic kidney disease with stage 5 chronic kidney disease or end stage renal disease: Secondary | ICD-10-CM | POA: Diagnosis not present

## 2016-06-20 DIAGNOSIS — D631 Anemia in chronic kidney disease: Secondary | ICD-10-CM | POA: Diagnosis not present

## 2016-06-20 DIAGNOSIS — M3210 Systemic lupus erythematosus, organ or system involvement unspecified: Secondary | ICD-10-CM | POA: Diagnosis not present

## 2016-06-20 DIAGNOSIS — D509 Iron deficiency anemia, unspecified: Secondary | ICD-10-CM | POA: Diagnosis not present

## 2016-06-20 DIAGNOSIS — R7989 Other specified abnormal findings of blood chemistry: Secondary | ICD-10-CM | POA: Diagnosis not present

## 2016-06-22 DIAGNOSIS — N2581 Secondary hyperparathyroidism of renal origin: Secondary | ICD-10-CM | POA: Diagnosis not present

## 2016-06-22 DIAGNOSIS — M3210 Systemic lupus erythematosus, organ or system involvement unspecified: Secondary | ICD-10-CM | POA: Diagnosis not present

## 2016-06-22 DIAGNOSIS — N186 End stage renal disease: Secondary | ICD-10-CM | POA: Diagnosis not present

## 2016-06-22 DIAGNOSIS — D509 Iron deficiency anemia, unspecified: Secondary | ICD-10-CM | POA: Diagnosis not present

## 2016-06-25 DIAGNOSIS — N186 End stage renal disease: Secondary | ICD-10-CM | POA: Diagnosis not present

## 2016-06-25 DIAGNOSIS — D509 Iron deficiency anemia, unspecified: Secondary | ICD-10-CM | POA: Diagnosis not present

## 2016-06-25 DIAGNOSIS — N2581 Secondary hyperparathyroidism of renal origin: Secondary | ICD-10-CM | POA: Diagnosis not present

## 2016-06-25 DIAGNOSIS — M3210 Systemic lupus erythematosus, organ or system involvement unspecified: Secondary | ICD-10-CM | POA: Diagnosis not present

## 2016-06-27 DIAGNOSIS — D509 Iron deficiency anemia, unspecified: Secondary | ICD-10-CM | POA: Diagnosis not present

## 2016-06-27 DIAGNOSIS — N2581 Secondary hyperparathyroidism of renal origin: Secondary | ICD-10-CM | POA: Diagnosis not present

## 2016-06-27 DIAGNOSIS — M3210 Systemic lupus erythematosus, organ or system involvement unspecified: Secondary | ICD-10-CM | POA: Diagnosis not present

## 2016-06-27 DIAGNOSIS — N186 End stage renal disease: Secondary | ICD-10-CM | POA: Diagnosis not present

## 2016-06-29 DIAGNOSIS — N2581 Secondary hyperparathyroidism of renal origin: Secondary | ICD-10-CM | POA: Diagnosis not present

## 2016-06-29 DIAGNOSIS — D509 Iron deficiency anemia, unspecified: Secondary | ICD-10-CM | POA: Diagnosis not present

## 2016-06-29 DIAGNOSIS — M3210 Systemic lupus erythematosus, organ or system involvement unspecified: Secondary | ICD-10-CM | POA: Diagnosis not present

## 2016-06-29 DIAGNOSIS — N186 End stage renal disease: Secondary | ICD-10-CM | POA: Diagnosis not present

## 2016-07-02 DIAGNOSIS — D509 Iron deficiency anemia, unspecified: Secondary | ICD-10-CM | POA: Diagnosis not present

## 2016-07-02 DIAGNOSIS — N186 End stage renal disease: Secondary | ICD-10-CM | POA: Diagnosis not present

## 2016-07-02 DIAGNOSIS — M3210 Systemic lupus erythematosus, organ or system involvement unspecified: Secondary | ICD-10-CM | POA: Diagnosis not present

## 2016-07-02 DIAGNOSIS — N2581 Secondary hyperparathyroidism of renal origin: Secondary | ICD-10-CM | POA: Diagnosis not present

## 2016-07-04 DIAGNOSIS — N186 End stage renal disease: Secondary | ICD-10-CM | POA: Diagnosis not present

## 2016-07-04 DIAGNOSIS — N2581 Secondary hyperparathyroidism of renal origin: Secondary | ICD-10-CM | POA: Diagnosis not present

## 2016-07-04 DIAGNOSIS — D509 Iron deficiency anemia, unspecified: Secondary | ICD-10-CM | POA: Diagnosis not present

## 2016-07-04 DIAGNOSIS — M3210 Systemic lupus erythematosus, organ or system involvement unspecified: Secondary | ICD-10-CM | POA: Diagnosis not present

## 2016-07-06 DIAGNOSIS — N2581 Secondary hyperparathyroidism of renal origin: Secondary | ICD-10-CM | POA: Diagnosis not present

## 2016-07-06 DIAGNOSIS — D509 Iron deficiency anemia, unspecified: Secondary | ICD-10-CM | POA: Diagnosis not present

## 2016-07-06 DIAGNOSIS — M3210 Systemic lupus erythematosus, organ or system involvement unspecified: Secondary | ICD-10-CM | POA: Diagnosis not present

## 2016-07-06 DIAGNOSIS — N186 End stage renal disease: Secondary | ICD-10-CM | POA: Diagnosis not present

## 2016-07-09 DIAGNOSIS — M3210 Systemic lupus erythematosus, organ or system involvement unspecified: Secondary | ICD-10-CM | POA: Diagnosis not present

## 2016-07-09 DIAGNOSIS — N186 End stage renal disease: Secondary | ICD-10-CM | POA: Diagnosis not present

## 2016-07-09 DIAGNOSIS — N2581 Secondary hyperparathyroidism of renal origin: Secondary | ICD-10-CM | POA: Diagnosis not present

## 2016-07-09 DIAGNOSIS — D509 Iron deficiency anemia, unspecified: Secondary | ICD-10-CM | POA: Diagnosis not present

## 2016-07-11 DIAGNOSIS — N2581 Secondary hyperparathyroidism of renal origin: Secondary | ICD-10-CM | POA: Diagnosis not present

## 2016-07-11 DIAGNOSIS — D509 Iron deficiency anemia, unspecified: Secondary | ICD-10-CM | POA: Diagnosis not present

## 2016-07-11 DIAGNOSIS — M3210 Systemic lupus erythematosus, organ or system involvement unspecified: Secondary | ICD-10-CM | POA: Diagnosis not present

## 2016-07-11 DIAGNOSIS — N186 End stage renal disease: Secondary | ICD-10-CM | POA: Diagnosis not present

## 2016-07-13 DIAGNOSIS — N2581 Secondary hyperparathyroidism of renal origin: Secondary | ICD-10-CM | POA: Diagnosis not present

## 2016-07-13 DIAGNOSIS — M3210 Systemic lupus erythematosus, organ or system involvement unspecified: Secondary | ICD-10-CM | POA: Diagnosis not present

## 2016-07-13 DIAGNOSIS — N186 End stage renal disease: Secondary | ICD-10-CM | POA: Diagnosis not present

## 2016-07-13 DIAGNOSIS — D509 Iron deficiency anemia, unspecified: Secondary | ICD-10-CM | POA: Diagnosis not present

## 2016-07-16 DIAGNOSIS — N186 End stage renal disease: Secondary | ICD-10-CM | POA: Diagnosis not present

## 2016-07-16 DIAGNOSIS — M3210 Systemic lupus erythematosus, organ or system involvement unspecified: Secondary | ICD-10-CM | POA: Diagnosis not present

## 2016-07-16 DIAGNOSIS — N2581 Secondary hyperparathyroidism of renal origin: Secondary | ICD-10-CM | POA: Diagnosis not present

## 2016-07-16 DIAGNOSIS — D509 Iron deficiency anemia, unspecified: Secondary | ICD-10-CM | POA: Diagnosis not present

## 2016-07-18 DIAGNOSIS — N186 End stage renal disease: Secondary | ICD-10-CM | POA: Diagnosis not present

## 2016-07-18 DIAGNOSIS — Z992 Dependence on renal dialysis: Secondary | ICD-10-CM | POA: Diagnosis not present

## 2016-07-18 DIAGNOSIS — M3210 Systemic lupus erythematosus, organ or system involvement unspecified: Secondary | ICD-10-CM | POA: Diagnosis not present

## 2016-07-18 DIAGNOSIS — D509 Iron deficiency anemia, unspecified: Secondary | ICD-10-CM | POA: Diagnosis not present

## 2016-07-18 DIAGNOSIS — I120 Hypertensive chronic kidney disease with stage 5 chronic kidney disease or end stage renal disease: Secondary | ICD-10-CM | POA: Diagnosis not present

## 2016-07-18 DIAGNOSIS — N2581 Secondary hyperparathyroidism of renal origin: Secondary | ICD-10-CM | POA: Diagnosis not present

## 2016-07-20 DIAGNOSIS — D631 Anemia in chronic kidney disease: Secondary | ICD-10-CM | POA: Diagnosis not present

## 2016-07-20 DIAGNOSIS — N186 End stage renal disease: Secondary | ICD-10-CM | POA: Diagnosis not present

## 2016-07-20 DIAGNOSIS — M3210 Systemic lupus erythematosus, organ or system involvement unspecified: Secondary | ICD-10-CM | POA: Diagnosis not present

## 2016-07-20 DIAGNOSIS — N2581 Secondary hyperparathyroidism of renal origin: Secondary | ICD-10-CM | POA: Diagnosis not present

## 2016-07-20 DIAGNOSIS — D509 Iron deficiency anemia, unspecified: Secondary | ICD-10-CM | POA: Diagnosis not present

## 2016-07-23 DIAGNOSIS — D509 Iron deficiency anemia, unspecified: Secondary | ICD-10-CM | POA: Diagnosis not present

## 2016-07-23 DIAGNOSIS — D631 Anemia in chronic kidney disease: Secondary | ICD-10-CM | POA: Diagnosis not present

## 2016-07-23 DIAGNOSIS — M3210 Systemic lupus erythematosus, organ or system involvement unspecified: Secondary | ICD-10-CM | POA: Diagnosis not present

## 2016-07-23 DIAGNOSIS — N186 End stage renal disease: Secondary | ICD-10-CM | POA: Diagnosis not present

## 2016-07-23 DIAGNOSIS — N2581 Secondary hyperparathyroidism of renal origin: Secondary | ICD-10-CM | POA: Diagnosis not present

## 2016-07-24 ENCOUNTER — Ambulatory Visit (HOSPITAL_COMMUNITY): Payer: Medicare Other | Attending: Cardiology

## 2016-07-24 ENCOUNTER — Other Ambulatory Visit: Payer: Self-pay

## 2016-07-24 DIAGNOSIS — I052 Rheumatic mitral stenosis with insufficiency: Secondary | ICD-10-CM | POA: Diagnosis not present

## 2016-07-24 DIAGNOSIS — I509 Heart failure, unspecified: Secondary | ICD-10-CM | POA: Insufficient documentation

## 2016-07-24 DIAGNOSIS — I350 Nonrheumatic aortic (valve) stenosis: Secondary | ICD-10-CM | POA: Diagnosis not present

## 2016-07-24 DIAGNOSIS — I132 Hypertensive heart and chronic kidney disease with heart failure and with stage 5 chronic kidney disease, or end stage renal disease: Secondary | ICD-10-CM | POA: Insufficient documentation

## 2016-07-24 DIAGNOSIS — I050 Rheumatic mitral stenosis: Secondary | ICD-10-CM | POA: Diagnosis not present

## 2016-07-24 DIAGNOSIS — I059 Rheumatic mitral valve disease, unspecified: Secondary | ICD-10-CM | POA: Diagnosis present

## 2016-07-24 DIAGNOSIS — N186 End stage renal disease: Secondary | ICD-10-CM | POA: Diagnosis not present

## 2016-07-24 DIAGNOSIS — Z720 Tobacco use: Secondary | ICD-10-CM | POA: Insufficient documentation

## 2016-07-24 DIAGNOSIS — I361 Nonrheumatic tricuspid (valve) insufficiency: Secondary | ICD-10-CM | POA: Insufficient documentation

## 2016-07-25 DIAGNOSIS — D631 Anemia in chronic kidney disease: Secondary | ICD-10-CM | POA: Diagnosis not present

## 2016-07-25 DIAGNOSIS — D509 Iron deficiency anemia, unspecified: Secondary | ICD-10-CM | POA: Diagnosis not present

## 2016-07-25 DIAGNOSIS — M3210 Systemic lupus erythematosus, organ or system involvement unspecified: Secondary | ICD-10-CM | POA: Diagnosis not present

## 2016-07-25 DIAGNOSIS — N2581 Secondary hyperparathyroidism of renal origin: Secondary | ICD-10-CM | POA: Diagnosis not present

## 2016-07-25 DIAGNOSIS — N186 End stage renal disease: Secondary | ICD-10-CM | POA: Diagnosis not present

## 2016-07-27 DIAGNOSIS — N2581 Secondary hyperparathyroidism of renal origin: Secondary | ICD-10-CM | POA: Diagnosis not present

## 2016-07-27 DIAGNOSIS — D631 Anemia in chronic kidney disease: Secondary | ICD-10-CM | POA: Diagnosis not present

## 2016-07-27 DIAGNOSIS — M3210 Systemic lupus erythematosus, organ or system involvement unspecified: Secondary | ICD-10-CM | POA: Diagnosis not present

## 2016-07-27 DIAGNOSIS — N186 End stage renal disease: Secondary | ICD-10-CM | POA: Diagnosis not present

## 2016-07-27 DIAGNOSIS — D509 Iron deficiency anemia, unspecified: Secondary | ICD-10-CM | POA: Diagnosis not present

## 2016-07-30 DIAGNOSIS — D509 Iron deficiency anemia, unspecified: Secondary | ICD-10-CM | POA: Diagnosis not present

## 2016-07-30 DIAGNOSIS — N2581 Secondary hyperparathyroidism of renal origin: Secondary | ICD-10-CM | POA: Diagnosis not present

## 2016-07-30 DIAGNOSIS — M3210 Systemic lupus erythematosus, organ or system involvement unspecified: Secondary | ICD-10-CM | POA: Diagnosis not present

## 2016-07-30 DIAGNOSIS — D631 Anemia in chronic kidney disease: Secondary | ICD-10-CM | POA: Diagnosis not present

## 2016-07-30 DIAGNOSIS — N186 End stage renal disease: Secondary | ICD-10-CM | POA: Diagnosis not present

## 2016-08-01 DIAGNOSIS — N186 End stage renal disease: Secondary | ICD-10-CM | POA: Diagnosis not present

## 2016-08-01 DIAGNOSIS — D509 Iron deficiency anemia, unspecified: Secondary | ICD-10-CM | POA: Diagnosis not present

## 2016-08-01 DIAGNOSIS — D631 Anemia in chronic kidney disease: Secondary | ICD-10-CM | POA: Diagnosis not present

## 2016-08-01 DIAGNOSIS — M3210 Systemic lupus erythematosus, organ or system involvement unspecified: Secondary | ICD-10-CM | POA: Diagnosis not present

## 2016-08-01 DIAGNOSIS — N2581 Secondary hyperparathyroidism of renal origin: Secondary | ICD-10-CM | POA: Diagnosis not present

## 2016-08-02 DIAGNOSIS — K640 First degree hemorrhoids: Secondary | ICD-10-CM | POA: Diagnosis not present

## 2016-08-02 DIAGNOSIS — Z86010 Personal history of colonic polyps: Secondary | ICD-10-CM | POA: Diagnosis not present

## 2016-08-02 DIAGNOSIS — D126 Benign neoplasm of colon, unspecified: Secondary | ICD-10-CM | POA: Diagnosis not present

## 2016-08-02 DIAGNOSIS — K5730 Diverticulosis of large intestine without perforation or abscess without bleeding: Secondary | ICD-10-CM | POA: Diagnosis not present

## 2016-08-03 DIAGNOSIS — D631 Anemia in chronic kidney disease: Secondary | ICD-10-CM | POA: Diagnosis not present

## 2016-08-03 DIAGNOSIS — N186 End stage renal disease: Secondary | ICD-10-CM | POA: Diagnosis not present

## 2016-08-03 DIAGNOSIS — M3210 Systemic lupus erythematosus, organ or system involvement unspecified: Secondary | ICD-10-CM | POA: Diagnosis not present

## 2016-08-03 DIAGNOSIS — D509 Iron deficiency anemia, unspecified: Secondary | ICD-10-CM | POA: Diagnosis not present

## 2016-08-03 DIAGNOSIS — N2581 Secondary hyperparathyroidism of renal origin: Secondary | ICD-10-CM | POA: Diagnosis not present

## 2016-08-06 DIAGNOSIS — N186 End stage renal disease: Secondary | ICD-10-CM | POA: Diagnosis not present

## 2016-08-06 DIAGNOSIS — D509 Iron deficiency anemia, unspecified: Secondary | ICD-10-CM | POA: Diagnosis not present

## 2016-08-06 DIAGNOSIS — N2581 Secondary hyperparathyroidism of renal origin: Secondary | ICD-10-CM | POA: Diagnosis not present

## 2016-08-06 DIAGNOSIS — D631 Anemia in chronic kidney disease: Secondary | ICD-10-CM | POA: Diagnosis not present

## 2016-08-06 DIAGNOSIS — M3210 Systemic lupus erythematosus, organ or system involvement unspecified: Secondary | ICD-10-CM | POA: Diagnosis not present

## 2016-08-07 DIAGNOSIS — D126 Benign neoplasm of colon, unspecified: Secondary | ICD-10-CM | POA: Diagnosis not present

## 2016-08-08 DIAGNOSIS — D509 Iron deficiency anemia, unspecified: Secondary | ICD-10-CM | POA: Diagnosis not present

## 2016-08-08 DIAGNOSIS — N186 End stage renal disease: Secondary | ICD-10-CM | POA: Diagnosis not present

## 2016-08-08 DIAGNOSIS — D631 Anemia in chronic kidney disease: Secondary | ICD-10-CM | POA: Diagnosis not present

## 2016-08-08 DIAGNOSIS — M3210 Systemic lupus erythematosus, organ or system involvement unspecified: Secondary | ICD-10-CM | POA: Diagnosis not present

## 2016-08-08 DIAGNOSIS — N2581 Secondary hyperparathyroidism of renal origin: Secondary | ICD-10-CM | POA: Diagnosis not present

## 2016-08-10 DIAGNOSIS — N2581 Secondary hyperparathyroidism of renal origin: Secondary | ICD-10-CM | POA: Diagnosis not present

## 2016-08-10 DIAGNOSIS — M3210 Systemic lupus erythematosus, organ or system involvement unspecified: Secondary | ICD-10-CM | POA: Diagnosis not present

## 2016-08-10 DIAGNOSIS — D509 Iron deficiency anemia, unspecified: Secondary | ICD-10-CM | POA: Diagnosis not present

## 2016-08-10 DIAGNOSIS — D631 Anemia in chronic kidney disease: Secondary | ICD-10-CM | POA: Diagnosis not present

## 2016-08-10 DIAGNOSIS — N186 End stage renal disease: Secondary | ICD-10-CM | POA: Diagnosis not present

## 2016-08-13 DIAGNOSIS — D631 Anemia in chronic kidney disease: Secondary | ICD-10-CM | POA: Diagnosis not present

## 2016-08-13 DIAGNOSIS — N186 End stage renal disease: Secondary | ICD-10-CM | POA: Diagnosis not present

## 2016-08-13 DIAGNOSIS — D509 Iron deficiency anemia, unspecified: Secondary | ICD-10-CM | POA: Diagnosis not present

## 2016-08-13 DIAGNOSIS — N2581 Secondary hyperparathyroidism of renal origin: Secondary | ICD-10-CM | POA: Diagnosis not present

## 2016-08-13 DIAGNOSIS — M3210 Systemic lupus erythematosus, organ or system involvement unspecified: Secondary | ICD-10-CM | POA: Diagnosis not present

## 2016-08-15 DIAGNOSIS — N2581 Secondary hyperparathyroidism of renal origin: Secondary | ICD-10-CM | POA: Diagnosis not present

## 2016-08-15 DIAGNOSIS — D509 Iron deficiency anemia, unspecified: Secondary | ICD-10-CM | POA: Diagnosis not present

## 2016-08-15 DIAGNOSIS — M3210 Systemic lupus erythematosus, organ or system involvement unspecified: Secondary | ICD-10-CM | POA: Diagnosis not present

## 2016-08-15 DIAGNOSIS — D631 Anemia in chronic kidney disease: Secondary | ICD-10-CM | POA: Diagnosis not present

## 2016-08-15 DIAGNOSIS — N186 End stage renal disease: Secondary | ICD-10-CM | POA: Diagnosis not present

## 2016-08-17 DIAGNOSIS — N186 End stage renal disease: Secondary | ICD-10-CM | POA: Diagnosis not present

## 2016-08-17 DIAGNOSIS — N2581 Secondary hyperparathyroidism of renal origin: Secondary | ICD-10-CM | POA: Diagnosis not present

## 2016-08-17 DIAGNOSIS — D631 Anemia in chronic kidney disease: Secondary | ICD-10-CM | POA: Diagnosis not present

## 2016-08-17 DIAGNOSIS — D509 Iron deficiency anemia, unspecified: Secondary | ICD-10-CM | POA: Diagnosis not present

## 2016-08-17 DIAGNOSIS — M3210 Systemic lupus erythematosus, organ or system involvement unspecified: Secondary | ICD-10-CM | POA: Diagnosis not present

## 2016-08-18 DIAGNOSIS — Z992 Dependence on renal dialysis: Secondary | ICD-10-CM | POA: Diagnosis not present

## 2016-08-18 DIAGNOSIS — I120 Hypertensive chronic kidney disease with stage 5 chronic kidney disease or end stage renal disease: Secondary | ICD-10-CM | POA: Diagnosis not present

## 2016-08-18 DIAGNOSIS — N186 End stage renal disease: Secondary | ICD-10-CM | POA: Diagnosis not present

## 2016-08-20 DIAGNOSIS — N2581 Secondary hyperparathyroidism of renal origin: Secondary | ICD-10-CM | POA: Diagnosis not present

## 2016-08-20 DIAGNOSIS — D509 Iron deficiency anemia, unspecified: Secondary | ICD-10-CM | POA: Diagnosis not present

## 2016-08-20 DIAGNOSIS — N186 End stage renal disease: Secondary | ICD-10-CM | POA: Diagnosis not present

## 2016-08-20 DIAGNOSIS — D631 Anemia in chronic kidney disease: Secondary | ICD-10-CM | POA: Diagnosis not present

## 2016-08-20 DIAGNOSIS — M3210 Systemic lupus erythematosus, organ or system involvement unspecified: Secondary | ICD-10-CM | POA: Diagnosis not present

## 2016-08-22 DIAGNOSIS — N186 End stage renal disease: Secondary | ICD-10-CM | POA: Diagnosis not present

## 2016-08-22 DIAGNOSIS — D631 Anemia in chronic kidney disease: Secondary | ICD-10-CM | POA: Diagnosis not present

## 2016-08-22 DIAGNOSIS — M3210 Systemic lupus erythematosus, organ or system involvement unspecified: Secondary | ICD-10-CM | POA: Diagnosis not present

## 2016-08-22 DIAGNOSIS — N2581 Secondary hyperparathyroidism of renal origin: Secondary | ICD-10-CM | POA: Diagnosis not present

## 2016-08-22 DIAGNOSIS — D509 Iron deficiency anemia, unspecified: Secondary | ICD-10-CM | POA: Diagnosis not present

## 2016-08-24 DIAGNOSIS — D631 Anemia in chronic kidney disease: Secondary | ICD-10-CM | POA: Diagnosis not present

## 2016-08-24 DIAGNOSIS — M3210 Systemic lupus erythematosus, organ or system involvement unspecified: Secondary | ICD-10-CM | POA: Diagnosis not present

## 2016-08-24 DIAGNOSIS — D509 Iron deficiency anemia, unspecified: Secondary | ICD-10-CM | POA: Diagnosis not present

## 2016-08-24 DIAGNOSIS — N186 End stage renal disease: Secondary | ICD-10-CM | POA: Diagnosis not present

## 2016-08-24 DIAGNOSIS — N2581 Secondary hyperparathyroidism of renal origin: Secondary | ICD-10-CM | POA: Diagnosis not present

## 2016-08-27 DIAGNOSIS — M75101 Unspecified rotator cuff tear or rupture of right shoulder, not specified as traumatic: Secondary | ICD-10-CM | POA: Diagnosis not present

## 2016-08-27 DIAGNOSIS — D509 Iron deficiency anemia, unspecified: Secondary | ICD-10-CM | POA: Diagnosis not present

## 2016-08-27 DIAGNOSIS — D631 Anemia in chronic kidney disease: Secondary | ICD-10-CM | POA: Diagnosis not present

## 2016-08-27 DIAGNOSIS — N186 End stage renal disease: Secondary | ICD-10-CM | POA: Diagnosis not present

## 2016-08-27 DIAGNOSIS — M3210 Systemic lupus erythematosus, organ or system involvement unspecified: Secondary | ICD-10-CM | POA: Diagnosis not present

## 2016-08-27 DIAGNOSIS — N2581 Secondary hyperparathyroidism of renal origin: Secondary | ICD-10-CM | POA: Diagnosis not present

## 2016-08-29 DIAGNOSIS — N2581 Secondary hyperparathyroidism of renal origin: Secondary | ICD-10-CM | POA: Diagnosis not present

## 2016-08-29 DIAGNOSIS — N186 End stage renal disease: Secondary | ICD-10-CM | POA: Diagnosis not present

## 2016-08-29 DIAGNOSIS — M3210 Systemic lupus erythematosus, organ or system involvement unspecified: Secondary | ICD-10-CM | POA: Diagnosis not present

## 2016-08-29 DIAGNOSIS — D631 Anemia in chronic kidney disease: Secondary | ICD-10-CM | POA: Diagnosis not present

## 2016-08-29 DIAGNOSIS — D509 Iron deficiency anemia, unspecified: Secondary | ICD-10-CM | POA: Diagnosis not present

## 2016-08-30 ENCOUNTER — Encounter: Payer: Self-pay | Admitting: Internal Medicine

## 2016-08-31 ENCOUNTER — Encounter: Payer: Self-pay | Admitting: Internal Medicine

## 2016-08-31 DIAGNOSIS — N2581 Secondary hyperparathyroidism of renal origin: Secondary | ICD-10-CM | POA: Diagnosis not present

## 2016-08-31 DIAGNOSIS — M3210 Systemic lupus erythematosus, organ or system involvement unspecified: Secondary | ICD-10-CM | POA: Diagnosis not present

## 2016-08-31 DIAGNOSIS — N186 End stage renal disease: Secondary | ICD-10-CM | POA: Diagnosis not present

## 2016-08-31 DIAGNOSIS — D631 Anemia in chronic kidney disease: Secondary | ICD-10-CM | POA: Diagnosis not present

## 2016-08-31 DIAGNOSIS — D509 Iron deficiency anemia, unspecified: Secondary | ICD-10-CM | POA: Diagnosis not present

## 2016-09-03 DIAGNOSIS — D509 Iron deficiency anemia, unspecified: Secondary | ICD-10-CM | POA: Diagnosis not present

## 2016-09-03 DIAGNOSIS — N2581 Secondary hyperparathyroidism of renal origin: Secondary | ICD-10-CM | POA: Diagnosis not present

## 2016-09-03 DIAGNOSIS — M3210 Systemic lupus erythematosus, organ or system involvement unspecified: Secondary | ICD-10-CM | POA: Diagnosis not present

## 2016-09-03 DIAGNOSIS — N186 End stage renal disease: Secondary | ICD-10-CM | POA: Diagnosis not present

## 2016-09-03 DIAGNOSIS — D631 Anemia in chronic kidney disease: Secondary | ICD-10-CM | POA: Diagnosis not present

## 2016-09-05 DIAGNOSIS — M3210 Systemic lupus erythematosus, organ or system involvement unspecified: Secondary | ICD-10-CM | POA: Diagnosis not present

## 2016-09-05 DIAGNOSIS — N2581 Secondary hyperparathyroidism of renal origin: Secondary | ICD-10-CM | POA: Diagnosis not present

## 2016-09-05 DIAGNOSIS — D631 Anemia in chronic kidney disease: Secondary | ICD-10-CM | POA: Diagnosis not present

## 2016-09-05 DIAGNOSIS — N186 End stage renal disease: Secondary | ICD-10-CM | POA: Diagnosis not present

## 2016-09-05 DIAGNOSIS — D509 Iron deficiency anemia, unspecified: Secondary | ICD-10-CM | POA: Diagnosis not present

## 2016-09-06 ENCOUNTER — Ambulatory Visit (INDEPENDENT_AMBULATORY_CARE_PROVIDER_SITE_OTHER): Payer: Medicare Other | Admitting: Podiatry

## 2016-09-06 ENCOUNTER — Encounter: Payer: Self-pay | Admitting: Podiatry

## 2016-09-06 VITALS — BP 170/91 | HR 78

## 2016-09-06 DIAGNOSIS — B351 Tinea unguium: Secondary | ICD-10-CM

## 2016-09-06 DIAGNOSIS — L608 Other nail disorders: Secondary | ICD-10-CM

## 2016-09-06 DIAGNOSIS — M79676 Pain in unspecified toe(s): Secondary | ICD-10-CM | POA: Diagnosis not present

## 2016-09-06 NOTE — Progress Notes (Signed)
   Subjective:    Patient ID: [name], female    DOB: April 29, 1961, 56 y.o.   MRN: [id number]  HPI this patient presents the office with chief complaint of severely painful big toenail, left foot and a painful big toenail, right foot. He states that the last 2 years. Her nails have started to grow deformed and the left great toenail is causing significant pain and discomfort at this time. She says that she is unable to even touch the toenail on the left big toe. She presents the office today for definitive evaluation and treatment of this condition    Review of Systems  All other systems reviewed and are negative.      Objective:   Physical Exam GENERAL APPEARANCE: Alert, conversant. Appropriately groomed. No acute distress.  VASCULAR: Pedal pulses are  palpable at  [facility] and PT bilateral.  Capillary refill time is immediate to all digits,  Normal temperature gradient.  Digital hair growth is present bilateral  NEUROLOGIC: sensation is normal to 5.07 monofilament at 5/5 sites bilateral.  Light touch is intact bilateral, Muscle strength normal.  MUSCULOSKELETAL: acceptable muscle strength, tone and stability bilateral.  Intrinsic muscluature intact bilateral.  Rectus appearance of foot and digits noted bilateral.   DERMATOLOGIC: skin color, texture, and turgor are within normal limits.  No preulcerative lesions or ulcers  are seen, no interdigital maceration noted.  No open lesions present.  . No drainage noted.  NAILS  Thick disfigured discolored toenails on both feet. Patient has severe pincer type nails hallux         Assessment & Plan:  Onychomycosis  B/L  Pincer Nails  B/L  IE  Debridement of nails  B/L.  Conservative treatment was performed, but we did discuss surgical correction of the nails in the future as needed. She was told to return to the office in 3 months for further evaluation and treatment   Gardiner Barefoot DPM

## 2016-09-07 DIAGNOSIS — N186 End stage renal disease: Secondary | ICD-10-CM | POA: Diagnosis not present

## 2016-09-07 DIAGNOSIS — N2581 Secondary hyperparathyroidism of renal origin: Secondary | ICD-10-CM | POA: Diagnosis not present

## 2016-09-07 DIAGNOSIS — M3210 Systemic lupus erythematosus, organ or system involvement unspecified: Secondary | ICD-10-CM | POA: Diagnosis not present

## 2016-09-07 DIAGNOSIS — D509 Iron deficiency anemia, unspecified: Secondary | ICD-10-CM | POA: Diagnosis not present

## 2016-09-07 DIAGNOSIS — D631 Anemia in chronic kidney disease: Secondary | ICD-10-CM | POA: Diagnosis not present

## 2016-09-10 DIAGNOSIS — N2581 Secondary hyperparathyroidism of renal origin: Secondary | ICD-10-CM | POA: Diagnosis not present

## 2016-09-10 DIAGNOSIS — M3210 Systemic lupus erythematosus, organ or system involvement unspecified: Secondary | ICD-10-CM | POA: Diagnosis not present

## 2016-09-10 DIAGNOSIS — D509 Iron deficiency anemia, unspecified: Secondary | ICD-10-CM | POA: Diagnosis not present

## 2016-09-10 DIAGNOSIS — D631 Anemia in chronic kidney disease: Secondary | ICD-10-CM | POA: Diagnosis not present

## 2016-09-10 DIAGNOSIS — N186 End stage renal disease: Secondary | ICD-10-CM | POA: Diagnosis not present

## 2016-09-11 DIAGNOSIS — M25511 Pain in right shoulder: Secondary | ICD-10-CM | POA: Diagnosis not present

## 2016-09-11 DIAGNOSIS — M75101 Unspecified rotator cuff tear or rupture of right shoulder, not specified as traumatic: Secondary | ICD-10-CM | POA: Diagnosis not present

## 2016-09-12 DIAGNOSIS — M3210 Systemic lupus erythematosus, organ or system involvement unspecified: Secondary | ICD-10-CM | POA: Diagnosis not present

## 2016-09-12 DIAGNOSIS — D509 Iron deficiency anemia, unspecified: Secondary | ICD-10-CM | POA: Diagnosis not present

## 2016-09-12 DIAGNOSIS — D631 Anemia in chronic kidney disease: Secondary | ICD-10-CM | POA: Diagnosis not present

## 2016-09-12 DIAGNOSIS — N186 End stage renal disease: Secondary | ICD-10-CM | POA: Diagnosis not present

## 2016-09-12 DIAGNOSIS — N2581 Secondary hyperparathyroidism of renal origin: Secondary | ICD-10-CM | POA: Diagnosis not present

## 2016-09-14 DIAGNOSIS — D631 Anemia in chronic kidney disease: Secondary | ICD-10-CM | POA: Diagnosis not present

## 2016-09-14 DIAGNOSIS — M3210 Systemic lupus erythematosus, organ or system involvement unspecified: Secondary | ICD-10-CM | POA: Diagnosis not present

## 2016-09-14 DIAGNOSIS — N186 End stage renal disease: Secondary | ICD-10-CM | POA: Diagnosis not present

## 2016-09-14 DIAGNOSIS — N2581 Secondary hyperparathyroidism of renal origin: Secondary | ICD-10-CM | POA: Diagnosis not present

## 2016-09-14 DIAGNOSIS — D509 Iron deficiency anemia, unspecified: Secondary | ICD-10-CM | POA: Diagnosis not present

## 2016-09-17 DIAGNOSIS — N186 End stage renal disease: Secondary | ICD-10-CM | POA: Diagnosis not present

## 2016-09-17 DIAGNOSIS — N2581 Secondary hyperparathyroidism of renal origin: Secondary | ICD-10-CM | POA: Diagnosis not present

## 2016-09-17 DIAGNOSIS — Z992 Dependence on renal dialysis: Secondary | ICD-10-CM | POA: Diagnosis not present

## 2016-09-17 DIAGNOSIS — D509 Iron deficiency anemia, unspecified: Secondary | ICD-10-CM | POA: Diagnosis not present

## 2016-09-17 DIAGNOSIS — M3210 Systemic lupus erythematosus, organ or system involvement unspecified: Secondary | ICD-10-CM | POA: Diagnosis not present

## 2016-09-17 DIAGNOSIS — I120 Hypertensive chronic kidney disease with stage 5 chronic kidney disease or end stage renal disease: Secondary | ICD-10-CM | POA: Diagnosis not present

## 2016-09-17 DIAGNOSIS — D631 Anemia in chronic kidney disease: Secondary | ICD-10-CM | POA: Diagnosis not present

## 2016-09-18 DIAGNOSIS — M75101 Unspecified rotator cuff tear or rupture of right shoulder, not specified as traumatic: Secondary | ICD-10-CM | POA: Diagnosis not present

## 2016-09-18 DIAGNOSIS — M25511 Pain in right shoulder: Secondary | ICD-10-CM | POA: Diagnosis not present

## 2016-09-19 DIAGNOSIS — N2581 Secondary hyperparathyroidism of renal origin: Secondary | ICD-10-CM | POA: Diagnosis not present

## 2016-09-19 DIAGNOSIS — N186 End stage renal disease: Secondary | ICD-10-CM | POA: Diagnosis not present

## 2016-09-19 DIAGNOSIS — D509 Iron deficiency anemia, unspecified: Secondary | ICD-10-CM | POA: Diagnosis not present

## 2016-09-19 DIAGNOSIS — M3210 Systemic lupus erythematosus, organ or system involvement unspecified: Secondary | ICD-10-CM | POA: Diagnosis not present

## 2016-09-19 DIAGNOSIS — D631 Anemia in chronic kidney disease: Secondary | ICD-10-CM | POA: Diagnosis not present

## 2016-09-20 ENCOUNTER — Ambulatory Visit: Payer: Medicare Other | Admitting: Internal Medicine

## 2016-09-21 DIAGNOSIS — M3210 Systemic lupus erythematosus, organ or system involvement unspecified: Secondary | ICD-10-CM | POA: Diagnosis not present

## 2016-09-21 DIAGNOSIS — N186 End stage renal disease: Secondary | ICD-10-CM | POA: Diagnosis not present

## 2016-09-21 DIAGNOSIS — D509 Iron deficiency anemia, unspecified: Secondary | ICD-10-CM | POA: Diagnosis not present

## 2016-09-21 DIAGNOSIS — D631 Anemia in chronic kidney disease: Secondary | ICD-10-CM | POA: Diagnosis not present

## 2016-09-21 DIAGNOSIS — N2581 Secondary hyperparathyroidism of renal origin: Secondary | ICD-10-CM | POA: Diagnosis not present

## 2016-09-24 DIAGNOSIS — D631 Anemia in chronic kidney disease: Secondary | ICD-10-CM | POA: Diagnosis not present

## 2016-09-24 DIAGNOSIS — D509 Iron deficiency anemia, unspecified: Secondary | ICD-10-CM | POA: Diagnosis not present

## 2016-09-24 DIAGNOSIS — M3210 Systemic lupus erythematosus, organ or system involvement unspecified: Secondary | ICD-10-CM | POA: Diagnosis not present

## 2016-09-24 DIAGNOSIS — N186 End stage renal disease: Secondary | ICD-10-CM | POA: Diagnosis not present

## 2016-09-24 DIAGNOSIS — N2581 Secondary hyperparathyroidism of renal origin: Secondary | ICD-10-CM | POA: Diagnosis not present

## 2016-09-25 ENCOUNTER — Ambulatory Visit (INDEPENDENT_AMBULATORY_CARE_PROVIDER_SITE_OTHER): Payer: Medicare Other | Admitting: Physician Assistant

## 2016-09-25 ENCOUNTER — Encounter: Payer: Self-pay | Admitting: Physician Assistant

## 2016-09-25 VITALS — BP 160/72 | HR 84 | Ht 66.0 in | Wt 129.4 lb

## 2016-09-25 DIAGNOSIS — N186 End stage renal disease: Secondary | ICD-10-CM | POA: Diagnosis not present

## 2016-09-25 DIAGNOSIS — I110 Hypertensive heart disease with heart failure: Secondary | ICD-10-CM | POA: Diagnosis not present

## 2016-09-25 DIAGNOSIS — Z01810 Encounter for preprocedural cardiovascular examination: Secondary | ICD-10-CM

## 2016-09-25 DIAGNOSIS — M25511 Pain in right shoulder: Secondary | ICD-10-CM | POA: Diagnosis not present

## 2016-09-25 DIAGNOSIS — I38 Endocarditis, valve unspecified: Secondary | ICD-10-CM

## 2016-09-25 DIAGNOSIS — Z992 Dependence on renal dialysis: Secondary | ICD-10-CM | POA: Diagnosis not present

## 2016-09-25 DIAGNOSIS — I5032 Chronic diastolic (congestive) heart failure: Secondary | ICD-10-CM

## 2016-09-25 DIAGNOSIS — I350 Nonrheumatic aortic (valve) stenosis: Secondary | ICD-10-CM

## 2016-09-25 DIAGNOSIS — M25411 Effusion, right shoulder: Secondary | ICD-10-CM | POA: Diagnosis not present

## 2016-09-25 DIAGNOSIS — I050 Rheumatic mitral stenosis: Secondary | ICD-10-CM | POA: Diagnosis not present

## 2016-09-25 DIAGNOSIS — M75101 Unspecified rotator cuff tear or rupture of right shoulder, not specified as traumatic: Secondary | ICD-10-CM | POA: Diagnosis not present

## 2016-09-25 HISTORY — DX: Endocarditis, valve unspecified: I38

## 2016-09-25 MED ORDER — CLONIDINE HCL 0.2 MG PO TABS: 0.2000 mg | ORAL_TABLET | Freq: Two times a day (BID) | ORAL | 3 refills | Status: DC

## 2016-09-25 NOTE — Patient Instructions (Addendum)
Medication Instructions:  LABETALOL 300 MG TABLET WITH DIRECTIONS TO TAKE ONLY 1 TABLET 3 TIMES A DAY  INCREASE CLONIDINE 0.2 MG 1 TABLET TWICE DAILY  Labwork: NONE ORDERED  Testing/Procedures: NONE ORDERED  Follow-Up: Your physician wants you to follow-up in: [place] DR. ROSS You will receive a reminder letter in the mail two months in advance. If you don't receive a letter, please call our office to schedule the follow-up appointment.  Any Other Special Instructions Will Be Listed Below (If Applicable).  If you need a refill on your cardiac medications before your next appointment, please call your pharmacy.

## 2016-09-25 NOTE — Progress Notes (Signed)
Cardiology Office Note:    Date:  09/25/2016   ID:  [name], DOB [birth year], MRN [id number]  PCP:  Benito Mccreedy, MD  Cardiologist:  Dr. Dorris Carnes    Referring MD: Benito Mccreedy, MD   Chief Complaint  Patient presents with  . Valvular Heart Disease    follow up    History of Present Illness:    [name] is a 56 y.o. female with a hx of CVA, HTN, ESRD on dialysis MWF, Lupus, dysfunctional uterine bleeding, PUD, aortic stenosis and mitral stenosis.  Last seen by Dr. Dorris Carnes in 10/17.    She returns for Cardiology follow up.  She is here alone.  She just had an injection in her R shoulder with Dr. Gladstone Lighter and will likely need surgery.  She needs cardiac clearance.  She denies chest pain or significant shortness of breath.  She is anemic and does get tired. She denies syncope.  She denies orthopnea, PND, edema.    Prior CV studies:   The following studies were reviewed today:  Echo 07/24/16 Mod conc LVH, EF 60-65, no RWMA, Gr 2 DDd, bicuspid aortic valve, mild to mod AS (mean 18, peak 38), MAC, mod mitral stenosis (mean 9, peak 19), mild to mod MR, severe LAE, normal RVSF, mild RAE, mild TR  Echo 05/12/15 EF 55-60, normal wall motion, grade 2 diastolic dysfunction, mild AS (mean 14, peak 29), moderate to severe MAC, mild mitral stenosis (mean 5, peak 9), severe LAE  Myoview 4/16 EF 59, no ischemia  Echo 3/16 Mod LVH, EF 55-60, no RWMA, Gr 1 DD, mod MS (mean 9), mod LAE, mild RAE, mod to severe TR, PASP 65, trivial pericardial eff  Past Medical History:  Diagnosis Date  . Anemia   . Aortic stenosis 09/25/2016   Echo 07/24/16: Mod conc LVH, EF 60-65, no RWMA, Gr 2 DDd, bicuspid aortic valve, mild to mod AS (mean 18, peak 38), MAC, mod mitral stenosis (mean 9, peak 19), mild to mod MR, severe LAE, normal RVSF, mild RAE, mild TR  . Blood transfusion '08   [facility]  . Chronic diastolic CHF (congestive heart failure) ([place])   . Dysfunctional uterine bleeding 12/19/2010  .  ESRD (end stage renal disease) ([place])    dialysis   Mon Wed Fri  . GERD (gastroesophageal reflux disease)   . Hemodialysis patient ([place])   . Hx of cardiovascular stress test    Lexiscan Myoview 4/16:  Normal stress nuclear study, EF 59%  . Hx of hiatal hernia   . Hypertension   . Lupus   . Mitral stenosis    Echo 4/16:  EF 55-60%, no RWMA, Gr 1 DD, mod MS (mean 9 mmHg), mod LAE, mild RAE, PASP 65, mod to severe TR, trivial eff  . Peptic ulcer disease     Past Surgical History:  Procedure Laterality Date  . DIALYSIS FISTULA CREATION  2007  . ESOPHAGOGASTRODUODENOSCOPY N/A 07/31/2014   Procedure: ESOPHAGOGASTRODUODENOSCOPY (EGD);  Surgeon: Clarene Essex, MD;  Location: [facility] ENDOSCOPY;  Service: Endoscopy;  Laterality: N/A;    Current Medications: Current Meds  Medication Sig  . amLODipine (NORVASC) 10 MG tablet Take 10 mg by mouth daily.    [name] aspirin 81 MG chewable tablet Chew 1 tablet (81 mg total) by mouth daily.  . calcitRIOL (ROCALTROL) 0.5 MCG capsule Take 3 capsules (1.5 mcg total) by mouth every Monday, Wednesday, and Friday with hemodialysis.  [name] labetalol (NORMODYNE) 300 MG tablet Take 300 mg by mouth  3 (three) times daily. 1 tablet 3 times a day  . lanthanum (FOSRENOL) 1000 MG chewable tablet Chew 2 tablets (2,000 mg total) by mouth 3 (three) times daily with meals.  [name] lisinopril (PRINIVIL,ZESTRIL) 40 MG tablet Take 40 mg by mouth daily.   . metroNIDAZOLE (FLAGYL) 500 MG tablet Take 1 tablet (500 mg total) by mouth 2 (two) times daily.  . ondansetron (ZOFRAN ODT) 8 MG disintegrating tablet Take 1 tablet (8 mg total) by mouth every 8 (eight) hours as needed for nausea or vomiting.  . ondansetron (ZOFRAN) 4 MG tablet Take 1 tablet (4 mg total) by mouth every 6 (six) hours as needed for nausea.  . pantoprazole (PROTONIX) 40 MG tablet Take 1 tablet (40 mg total) by mouth 2 (two) times daily.  . [DISCONTINUED] cloNIDine (CATAPRES) 0.1 MG tablet Take 1 tablet (0.1 mg total) by mouth 2  (two) times daily.     Allergies:   Patient has no known allergies.   Social History   Social History  . Marital status: Single    Spouse name: N/A  . Number of children: N/A  . Years of education: N/A   Social History Main Topics  . Smoking status: Current Some Day Smoker    Packs/day: 0.25    Types: Cigarettes  . Smokeless tobacco: Never Used  . Alcohol use 0.0 oz/week     Comment: occasional  . Drug use: Yes    Types: Marijuana     Comment: occasionally  . Sexual activity: Not Asked   Other Topics Concern  . None   Social History Narrative  . None     Family History  Problem Relation Age of Onset  . Liver cancer Maternal Grandmother   . Lymphoma Maternal Aunt   . Heart attack Neg Hx      ROS:   Please see the history of present illness.    ROS All other systems reviewed and are negative.   EKGs/Labs/Other Test Reviewed:    EKG:  EKG is  ordered today.  The ekg ordered today demonstrates NSR, HR 84, normal axis, QTc 460 ms, no significant change since last tracing.   Recent Labs: 02/23/2016: Hemoglobin 7.7; Platelets 219   Recent Lipid Panel    Component Value Date/Time   CHOL 187 02/23/2016 1040   TRIG 89 02/23/2016 1040   HDL 55 02/23/2016 1040   CHOLHDL 3.4 02/23/2016 1040   VLDL 18 02/23/2016 1040   LDLCALC 114 02/23/2016 1040     Physical Exam:    VS:  BP (!) 160/72   Pulse 84   Ht 5' 6"  (1.676 m)   Wt 129 lb 6.4 oz (58.7 kg)   LMP 12/05/2010   BMI 20.89 kg/m     Wt Readings from Last 3 Encounters:  09/25/16 129 lb 6.4 oz (58.7 kg)  04/24/16 133 lb 14.4 oz (60.7 kg)  02/23/16 131 lb 3.2 oz (59.5 kg)     Physical Exam  Constitutional: She is oriented to person, place, and time. She appears well-developed and well-nourished. No distress.  HENT:  Head: Normocephalic and atraumatic.  Eyes: No scleral icterus.  Neck: Normal range of motion. No JVD present.  Cardiovascular: Normal rate, regular rhythm, S1 normal and S2 normal.     Murmur heard.  Harsh systolic murmur is present with a grade of 2/6  at the upper right sternal border, lower left sternal border Pulmonary/Chest: Breath sounds normal. She has no wheezes. She has no rhonchi. She has  no rales.  Abdominal: Soft. There is no tenderness.  Musculoskeletal: She exhibits no edema.  Neurological: She is alert and oriented to person, place, and time.  Skin: Skin is warm and dry.  Psychiatric: She has a normal mood and affect.    ASSESSMENT:    1. Mitral valve stenosis, unspecified etiology   2. Aortic valve stenosis, etiology of cardiac valve disease unspecified   3. Hypertensive heart disease with chronic diastolic congestive heart failure ([place])   4. End-stage renal disease on hemodialysis ([place])   5. Preoperative cardiovascular examination    PLAN:    In order of problems listed above:  1. Mitral valve stenosis, unspecified etiology -  Mod MS by echo in 3/18.  She should have a follow up echo in 3/19.    2. Aortic valve stenosis, etiology of cardiac valve disease unspecified -  Mild to mod AS by echo in 3/18.  Plan follow up Echo in 3/19  3. Hypertensive heart disease with chronic diastolic congestive heart failure (HCC) -  BP uncontrolled.  She was mistakenly trying to take Labetalol 900 mg TID.  But would often get nauseated and vomit.  I suspect she has not been getting all of her medications in due to emesis.  I have asked her to take the Labetalol 300 mg TID as planned. Increase Clonidine to 0.2 mg bid.   4. End-stage renal disease on hemodialysis ([place]) - MWF dialysis.   5. Preoperative cardiovascular examination - The patient does not have any unstable cardiac conditions.  Upon evaluation today, she can achieve 4 METs or greater without anginal symptoms.  According to [facility] and AHA guidelines, she requires no further cardiac workup prior to her noncardiac surgery and should be at acceptable risk.  Our service is available as necessary in the  perioperative period.   Dispo:  Return in about 6 months (around 03/28/2017) for Routine Follow Up, w/ Dr. Harrington Challenger.   Medication Adjustments/Labs and Tests Ordered: Current medicines are reviewed at length with the patient today.  Concerns regarding medicines are outlined above.  Orders/Tests:  Orders Placed This Encounter  Procedures  . EKG 12-Lead   Medication changes: Meds ordered this encounter  Medications  . cloNIDine (CATAPRES) 0.2 MG tablet    Sig: Take 1 tablet (0.2 mg total) by mouth 2 (two) times daily.    Dispense:  180 tablet    Refill:  3   Signed, Richardson Dopp, PA-C  09/25/2016 10:41 AM    [facility] [address], [place], [place]  [address] Phone: [phone]; Fax: [phone]

## 2016-09-26 DIAGNOSIS — D631 Anemia in chronic kidney disease: Secondary | ICD-10-CM | POA: Diagnosis not present

## 2016-09-26 DIAGNOSIS — D509 Iron deficiency anemia, unspecified: Secondary | ICD-10-CM | POA: Diagnosis not present

## 2016-09-26 DIAGNOSIS — N186 End stage renal disease: Secondary | ICD-10-CM | POA: Diagnosis not present

## 2016-09-26 DIAGNOSIS — M3210 Systemic lupus erythematosus, organ or system involvement unspecified: Secondary | ICD-10-CM | POA: Diagnosis not present

## 2016-09-26 DIAGNOSIS — N2581 Secondary hyperparathyroidism of renal origin: Secondary | ICD-10-CM | POA: Diagnosis not present

## 2016-09-28 DIAGNOSIS — N186 End stage renal disease: Secondary | ICD-10-CM | POA: Diagnosis not present

## 2016-09-28 DIAGNOSIS — D509 Iron deficiency anemia, unspecified: Secondary | ICD-10-CM | POA: Diagnosis not present

## 2016-09-28 DIAGNOSIS — N2581 Secondary hyperparathyroidism of renal origin: Secondary | ICD-10-CM | POA: Diagnosis not present

## 2016-09-28 DIAGNOSIS — D631 Anemia in chronic kidney disease: Secondary | ICD-10-CM | POA: Diagnosis not present

## 2016-09-28 DIAGNOSIS — M3210 Systemic lupus erythematosus, organ or system involvement unspecified: Secondary | ICD-10-CM | POA: Diagnosis not present

## 2016-10-01 DIAGNOSIS — M3210 Systemic lupus erythematosus, organ or system involvement unspecified: Secondary | ICD-10-CM | POA: Diagnosis not present

## 2016-10-01 DIAGNOSIS — N186 End stage renal disease: Secondary | ICD-10-CM | POA: Diagnosis not present

## 2016-10-01 DIAGNOSIS — N2581 Secondary hyperparathyroidism of renal origin: Secondary | ICD-10-CM | POA: Diagnosis not present

## 2016-10-01 DIAGNOSIS — D631 Anemia in chronic kidney disease: Secondary | ICD-10-CM | POA: Diagnosis not present

## 2016-10-01 DIAGNOSIS — D509 Iron deficiency anemia, unspecified: Secondary | ICD-10-CM | POA: Diagnosis not present

## 2016-10-02 NOTE — Progress Notes (Signed)
Please place orders in EPIC as patient is being scheduled for a Pre-op appointment! Thank you! 

## 2016-10-02 NOTE — Patient Instructions (Addendum)
Salimata Christenson  10/02/2016   Your procedure is scheduled on: 10-09-16  Report to [facility] Main  Entrance Take [facility]  elevators to 3rd floor to  [place] at 1030AM.   Call this number if you have problems the morning of surgery [phone]    Remember: ONLY 1 PERSON MAY GO WITH YOU TO SHORT STAY TO GET  READY MORNING OF [place].  Do not eat food or drink liquids :After Midnight.     Take these medicines the morning of surgery with A SIP OF WATER: clonidine(catapres), labetalol(normodyne), pantoprazole(protonix), tylenol as needed                                You may not have any metal on your body including hair pins and              piercings  Do not wear jewelry, make-up, lotions, powders or perfumes, deodorant             Do not wear nail polish.  Do not shave  48 hours prior to surgery.        Do not bring valuables to the hospital. [place].  Contacts, dentures or bridgework may not be worn into surgery.  Leave suitcase in the car. After surgery it may be brought to your room.               Please read over the following fact sheets you were given: _____________________________________________________________________             [facility] - Preparing for Surgery Before surgery, you can play an important role.  Because skin is not sterile, your skin needs to be as free of germs as possible.  You can reduce the number of germs on your skin by washing with CHG (chlorahexidine gluconate) soap before surgery.  CHG is an antiseptic cleaner which kills germs and bonds with the skin to continue killing germs even after washing. Please DO NOT use if you have an allergy to CHG or antibacterial soaps.  If your skin becomes reddened/irritated stop using the CHG and inform your nurse when you arrive at Short Stay. Do not shave (including legs and underarms) for at least 48 hours prior to the first CHG  shower.  You may shave your face/neck. Please follow these instructions carefully:  1.  Shower with CHG Soap the night before surgery and the  morning of Surgery.  2.  If you choose to wash your hair, wash your hair first as usual with your  normal  shampoo.  3.  After you shampoo, rinse your hair and body thoroughly to remove the  shampoo.                           4.  Use CHG as you would any other liquid soap.  You can apply chg directly  to the skin and wash                       Gently with a scrungie or clean washcloth.  5.  Apply the CHG Soap to your body ONLY FROM THE NECK DOWN.   Do not  use on face/ open                           Wound or open sores. Avoid contact with eyes, ears mouth and genitals (private parts).                       Wash face,  Genitals (private parts) with your normal soap.             6.  Wash thoroughly, paying special attention to the area where your surgery  will be performed.  7.  Thoroughly rinse your body with warm water from the neck down.  8.  DO NOT shower/wash with your normal soap after using and rinsing off  the CHG Soap.                9.  Pat yourself dry with a clean towel.            10.  Wear clean pajamas.            11.  Place clean sheets on your bed the night of your first shower and do not  sleep with pets. Day of Surgery : Do not apply any lotions/deodorants the morning of surgery.  Please wear clean clothes to the hospital/surgery center.  FAILURE TO FOLLOW THESE INSTRUCTIONS MAY RESULT IN THE CANCELLATION OF YOUR SURGERY PATIENT SIGNATURE_________________________________  NURSE SIGNATURE__________________________________  ________________________________________________________________________   Adam Phenix  An incentive spirometer is a tool that can help keep your lungs clear and active. This tool measures how well you are filling your lungs with each breath. Taking long deep breaths may help reverse or decrease the chance  of developing breathing (pulmonary) problems (especially infection) following:  A long period of time when you are unable to move or be active. BEFORE THE PROCEDURE   If the spirometer includes an indicator to show your best effort, your nurse or respiratory therapist will set it to a desired goal.  If possible, sit up straight or lean slightly forward. Try not to slouch.  Hold the incentive spirometer in an upright position. INSTRUCTIONS FOR USE  1. Sit on the edge of your bed if possible, or sit up as far as you can in bed or on a chair. 2. Hold the incentive spirometer in an upright position. 3. Breathe out normally. 4. Place the mouthpiece in your mouth and seal your lips tightly around it. 5. Breathe in slowly and as deeply as possible, raising the piston or the ball toward the top of the column. 6. Hold your breath for 3-5 seconds or for as long as possible. Allow the piston or ball to fall to the bottom of the column. 7. Remove the mouthpiece from your mouth and breathe out normally. 8. Rest for a few seconds and repeat Steps 1 through 7 at least 10 times every 1-2 hours when you are awake. Take your time and take a few normal breaths between deep breaths. 9. The spirometer may include an indicator to show your best effort. Use the indicator as a goal to work toward during each repetition. 10. After each set of 10 deep breaths, practice coughing to be sure your lungs are clear. If you have an incision (the cut made at the time of surgery), support your incision when coughing by placing a pillow or rolled up towels firmly against it. Once you are able to get out of bed, walk  around indoors and cough well. You may stop using the incentive spirometer when instructed by your caregiver.  RISKS AND COMPLICATIONS  Take your time so you do not get dizzy or light-headed.  If you are in pain, you may need to take or ask for pain medication before doing incentive spirometry. It is harder to  take a deep breath if you are having pain. AFTER USE  Rest and breathe slowly and easily.  It can be helpful to keep track of a log of your progress. Your caregiver can provide you with a simple table to help with this. If you are using the spirometer at home, follow these instructions: [place] IF:   You are having difficultly using the spirometer.  You have trouble using the spirometer as often as instructed.  Your pain medication is not giving enough relief while using the spirometer.  You develop fever of 100.5 F (38.1 C) or higher. SEEK IMMEDIATE MEDICAL CARE IF:   You cough up bloody sputum that had not been present before.  You develop fever of 102 F (38.9 C) or greater.  You develop worsening pain at or near the incision site. MAKE SURE YOU:   Understand these instructions.  Will watch your condition.  Will get help right away if you are not doing well or get worse. Document Released: 09/17/2006 Document Revised: 07/30/2011 Document Reviewed: 11/18/2006 [facility] Patient Information 2014 [place], [place].   ________________________________________________________________________

## 2016-10-02 NOTE — Progress Notes (Signed)
Cardiac clear Dr Daneen Schick 09-25-16 epic LOV cardio and clear PA Kathlen Mody epic EKG 09-25-16 epic ECHO 07-24-16 epic Stress 09-08-14 epic

## 2016-10-03 DIAGNOSIS — N2581 Secondary hyperparathyroidism of renal origin: Secondary | ICD-10-CM | POA: Diagnosis not present

## 2016-10-03 DIAGNOSIS — M3210 Systemic lupus erythematosus, organ or system involvement unspecified: Secondary | ICD-10-CM | POA: Diagnosis not present

## 2016-10-03 DIAGNOSIS — D631 Anemia in chronic kidney disease: Secondary | ICD-10-CM | POA: Diagnosis not present

## 2016-10-03 DIAGNOSIS — N186 End stage renal disease: Secondary | ICD-10-CM | POA: Diagnosis not present

## 2016-10-03 DIAGNOSIS — D509 Iron deficiency anemia, unspecified: Secondary | ICD-10-CM | POA: Diagnosis not present

## 2016-10-03 NOTE — Progress Notes (Signed)
Please place orders in EPIC as patient has a pre-op appointment on 10/04/2016! Thank you!

## 2016-10-04 ENCOUNTER — Ambulatory Visit (HOSPITAL_COMMUNITY)
Admission: RE | Admit: 2016-10-04 | Discharge: 2016-10-04 | Disposition: A | Discharge status: Home / Self Care | Payer: Medicare Other | Source: Ambulatory Visit | Attending: Surgical | Admitting: Surgical

## 2016-10-04 ENCOUNTER — Encounter (HOSPITAL_COMMUNITY)
Admission: RE | Admit: 2016-10-04 | Discharge: 2016-10-04 | Disposition: A | Discharge status: Home / Self Care | Payer: Medicare Other | Source: Ambulatory Visit | Attending: Orthopedic Surgery | Admitting: Orthopedic Surgery

## 2016-10-04 ENCOUNTER — Encounter (HOSPITAL_COMMUNITY): Payer: Self-pay

## 2016-10-04 DIAGNOSIS — I517 Cardiomegaly: Secondary | ICD-10-CM | POA: Insufficient documentation

## 2016-10-04 DIAGNOSIS — Z01818 Encounter for other preprocedural examination: Secondary | ICD-10-CM

## 2016-10-04 DIAGNOSIS — J9811 Atelectasis: Secondary | ICD-10-CM | POA: Insufficient documentation

## 2016-10-04 HISTORY — DX: Cerebral infarction, unspecified: I63.9

## 2016-10-05 DIAGNOSIS — N186 End stage renal disease: Secondary | ICD-10-CM | POA: Diagnosis not present

## 2016-10-05 DIAGNOSIS — M3210 Systemic lupus erythematosus, organ or system involvement unspecified: Secondary | ICD-10-CM | POA: Diagnosis not present

## 2016-10-05 DIAGNOSIS — D509 Iron deficiency anemia, unspecified: Secondary | ICD-10-CM | POA: Diagnosis not present

## 2016-10-05 DIAGNOSIS — N2581 Secondary hyperparathyroidism of renal origin: Secondary | ICD-10-CM | POA: Diagnosis not present

## 2016-10-05 DIAGNOSIS — D631 Anemia in chronic kidney disease: Secondary | ICD-10-CM | POA: Diagnosis not present

## 2016-10-08 DIAGNOSIS — N186 End stage renal disease: Secondary | ICD-10-CM | POA: Diagnosis not present

## 2016-10-08 DIAGNOSIS — N2581 Secondary hyperparathyroidism of renal origin: Secondary | ICD-10-CM | POA: Diagnosis not present

## 2016-10-08 DIAGNOSIS — D631 Anemia in chronic kidney disease: Secondary | ICD-10-CM | POA: Diagnosis not present

## 2016-10-08 DIAGNOSIS — D509 Iron deficiency anemia, unspecified: Secondary | ICD-10-CM | POA: Diagnosis not present

## 2016-10-08 DIAGNOSIS — M3210 Systemic lupus erythematosus, organ or system involvement unspecified: Secondary | ICD-10-CM | POA: Diagnosis not present

## 2016-10-09 ENCOUNTER — Ambulatory Visit (HOSPITAL_COMMUNITY): Payer: Medicare Other | Admitting: Certified Registered Nurse Anesthetist

## 2016-10-09 ENCOUNTER — Encounter (HOSPITAL_COMMUNITY)
Admission: RE | Disposition: A | Discharge status: Home / Self Care | Payer: Self-pay | Source: Ambulatory Visit | Attending: Orthopedic Surgery

## 2016-10-09 ENCOUNTER — Observation Stay (HOSPITAL_COMMUNITY)
Admission: RE | Admit: 2016-10-09 | Discharge: 2016-10-10 | Disposition: A | Discharge status: Home / Self Care | Payer: Medicare Other | Source: Ambulatory Visit | Attending: Orthopedic Surgery | Admitting: Orthopedic Surgery

## 2016-10-09 ENCOUNTER — Encounter (HOSPITAL_COMMUNITY): Payer: Self-pay | Admitting: *Deleted

## 2016-10-09 DIAGNOSIS — D631 Anemia in chronic kidney disease: Secondary | ICD-10-CM | POA: Diagnosis not present

## 2016-10-09 DIAGNOSIS — Z79899 Other long term (current) drug therapy: Secondary | ICD-10-CM | POA: Insufficient documentation

## 2016-10-09 DIAGNOSIS — Z8673 Personal history of transient ischemic attack (TIA), and cerebral infarction without residual deficits: Secondary | ICD-10-CM | POA: Diagnosis not present

## 2016-10-09 DIAGNOSIS — I132 Hypertensive heart and chronic kidney disease with heart failure and with stage 5 chronic kidney disease, or end stage renal disease: Secondary | ICD-10-CM | POA: Insufficient documentation

## 2016-10-09 DIAGNOSIS — F17210 Nicotine dependence, cigarettes, uncomplicated: Secondary | ICD-10-CM | POA: Diagnosis not present

## 2016-10-09 DIAGNOSIS — Z992 Dependence on renal dialysis: Secondary | ICD-10-CM | POA: Diagnosis not present

## 2016-10-09 DIAGNOSIS — M659 Synovitis and tenosynovitis, unspecified: Secondary | ICD-10-CM | POA: Diagnosis not present

## 2016-10-09 DIAGNOSIS — N186 End stage renal disease: Secondary | ICD-10-CM | POA: Diagnosis not present

## 2016-10-09 DIAGNOSIS — K219 Gastro-esophageal reflux disease without esophagitis: Secondary | ICD-10-CM | POA: Insufficient documentation

## 2016-10-09 DIAGNOSIS — I5032 Chronic diastolic (congestive) heart failure: Secondary | ICD-10-CM | POA: Insufficient documentation

## 2016-10-09 DIAGNOSIS — M65811 Other synovitis and tenosynovitis, right shoulder: Secondary | ICD-10-CM | POA: Diagnosis not present

## 2016-10-09 DIAGNOSIS — G8918 Other acute postprocedural pain: Secondary | ICD-10-CM | POA: Diagnosis not present

## 2016-10-09 DIAGNOSIS — Z7982 Long term (current) use of aspirin: Secondary | ICD-10-CM | POA: Diagnosis not present

## 2016-10-09 DIAGNOSIS — M67811 Other specified disorders of synovium, right shoulder: Secondary | ICD-10-CM | POA: Diagnosis not present

## 2016-10-09 DIAGNOSIS — M75101 Unspecified rotator cuff tear or rupture of right shoulder, not specified as traumatic: Secondary | ICD-10-CM | POA: Diagnosis not present

## 2016-10-09 DIAGNOSIS — I509 Heart failure, unspecified: Secondary | ICD-10-CM | POA: Diagnosis not present

## 2016-10-09 DIAGNOSIS — M329 Systemic lupus erythematosus, unspecified: Secondary | ICD-10-CM | POA: Diagnosis not present

## 2016-10-09 DIAGNOSIS — M75121 Complete rotator cuff tear or rupture of right shoulder, not specified as traumatic: Secondary | ICD-10-CM | POA: Diagnosis not present

## 2016-10-09 DIAGNOSIS — M12811 Other specific arthropathies, not elsewhere classified, right shoulder: Secondary | ICD-10-CM | POA: Diagnosis present

## 2016-10-09 HISTORY — PX: SHOULDER OPEN ROTATOR CUFF REPAIR: SHX2407

## 2016-10-09 LAB — COMPREHENSIVE METABOLIC PANEL
ALT: 14 U/L (ref 14–54)
AST: 18 U/L (ref 15–41)
Albumin: 3.3 g/dL — ABNORMAL LOW (ref 3.5–5.0)
Alkaline Phosphatase: 91 U/L (ref 38–126)
Anion gap: 13 (ref 5–15)
BUN: 27 mg/dL — ABNORMAL HIGH (ref 6–20)
CO2: 29 mmol/L (ref 22–32)
Calcium: 9.6 mg/dL (ref 8.9–10.3)
Chloride: 95 mmol/L — ABNORMAL LOW (ref 101–111)
Creatinine, Ser: 5.51 mg/dL — ABNORMAL HIGH (ref 0.44–1.00)
GFR calc Af Amer: 9 mL/min — ABNORMAL LOW (ref >60)
GFR calc non Af Amer: 8 mL/min — ABNORMAL LOW (ref >60)
Glucose, Bld: 97 mg/dL (ref 65–99)
Potassium: 3.7 mmol/L (ref 3.5–5.1)
Sodium: 137 mmol/L (ref 135–145)
Total Bilirubin: 0.7 mg/dL (ref 0.3–1.2)
Total Protein: 7.5 g/dL (ref 6.5–8.1)

## 2016-10-09 LAB — CBC WITH DIFFERENTIAL/PLATELET
Basophils Absolute: 0 K/uL (ref 0.0–0.1)
Basophils Relative: 0 %
Eosinophils Absolute: 0.1 K/uL (ref 0.0–0.7)
Eosinophils Relative: 1 %
HCT: 28.5 % — ABNORMAL LOW (ref 36.0–46.0)
Hemoglobin: 8.7 g/dL — ABNORMAL LOW (ref 12.0–15.0)
Lymphocytes Relative: 15 %
Lymphs Abs: 1 K/uL (ref 0.7–4.0)
MCH: 29.7 pg (ref 26.0–34.0)
MCHC: 30.5 g/dL (ref 30.0–36.0)
MCV: 97.3 fL (ref 78.0–100.0)
Monocytes Absolute: 0.5 K/uL (ref 0.1–1.0)
Monocytes Relative: 8 %
Neutro Abs: 5 K/uL (ref 1.7–7.7)
Neutrophils Relative %: 76 %
Platelets: 308 K/uL (ref 150–400)
RBC: 2.93 MIL/uL — ABNORMAL LOW (ref 3.87–5.11)
RDW: 17.7 % — ABNORMAL HIGH (ref 11.5–15.5)
WBC: 6.5 K/uL (ref 4.0–10.5)

## 2016-10-09 LAB — PROTIME-INR
INR: 1
Prothrombin Time: 13.2 s (ref 11.4–15.2)

## 2016-10-09 LAB — HEMOGLOBIN AND HEMATOCRIT, BLOOD
HCT: 28.5 % — ABNORMAL LOW (ref 36.0–46.0)
HEMOGLOBIN: 8.5 g/dL — AB (ref 12.0–15.0)

## 2016-10-09 SURGERY — REPAIR, ROTATOR CUFF, OPEN
Anesthesia: General | Site: Shoulder | Laterality: Right

## 2016-10-09 MED ORDER — AMLODIPINE BESYLATE 10 MG PO TABS
10.0000 mg | ORAL_TABLET | Freq: Every day | ORAL | Status: DC
  Administered 2016-10-09: 10 mg via ORAL
  Filled 2016-10-09: qty 1

## 2016-10-09 MED ORDER — METOCLOPRAMIDE HCL 5 MG/ML IJ SOLN
5.0000 mg | Freq: Three times a day (TID) | INTRAMUSCULAR | Status: DC | PRN
  Administered 2016-10-10: 08:00:00 10 mg via INTRAVENOUS
  Filled 2016-10-09: qty 2

## 2016-10-09 MED ORDER — CHLORHEXIDINE GLUCONATE 4 % EX LIQD: 60.0000 mL | Freq: Once | CUTANEOUS | Status: DC

## 2016-10-09 MED ORDER — ROCURONIUM BROMIDE 100 MG/10ML IV SOLN
INTRAVENOUS | Status: DC | PRN
  Administered 2016-10-09: 40 mg via INTRAVENOUS

## 2016-10-09 MED ORDER — LACTATED RINGERS IV SOLN: INTRAVENOUS | Status: DC

## 2016-10-09 MED ORDER — ONDANSETRON HCL 4 MG PO TABS: 4.0000 mg | ORAL_TABLET | Freq: Four times a day (QID) | ORAL | Status: DC | PRN

## 2016-10-09 MED ORDER — PANTOPRAZOLE SODIUM 40 MG PO TBEC
40.0000 mg | DELAYED_RELEASE_TABLET | Freq: Two times a day (BID) | ORAL | Status: DC
  Administered 2016-10-09 – 2016-10-10 (×2): 40 mg via ORAL
  Filled 2016-10-09 (×2): qty 1

## 2016-10-09 MED ORDER — CHLORHEXIDINE GLUCONATE 4 % EX LIQD
60.0000 mL | Freq: Once | CUTANEOUS | Status: DC
Start: 2016-10-09 — End: 2016-10-09

## 2016-10-09 MED ORDER — PROMETHAZINE HCL 25 MG/ML IJ SOLN: 6.2500 mg | INTRAMUSCULAR | Status: DC | PRN

## 2016-10-09 MED ORDER — ONDANSETRON HCL 4 MG/2ML IJ SOLN
4.0000 mg | Freq: Four times a day (QID) | INTRAMUSCULAR | Status: DC | PRN
  Administered 2016-10-09 – 2016-10-10 (×2): 4 mg via INTRAVENOUS
  Filled 2016-10-09 (×2): qty 2

## 2016-10-09 MED ORDER — METRONIDAZOLE 500 MG PO TABS
500.0000 mg | ORAL_TABLET | Freq: Two times a day (BID) | ORAL | Status: DC
  Administered 2016-10-09 – 2016-10-10 (×2): 500 mg via ORAL
  Filled 2016-10-09 (×2): qty 1

## 2016-10-09 MED ORDER — CEFAZOLIN SODIUM-DEXTROSE 1-4 GM/50ML-% IV SOLN
1.0000 g | Freq: Four times a day (QID) | INTRAVENOUS | Status: AC
  Administered 2016-10-09 – 2016-10-10 (×3): 1 g via INTRAVENOUS
  Filled 2016-10-09 (×3): qty 50

## 2016-10-09 MED ORDER — SODIUM CHLORIDE 0.9 % IV SOLN
INTRAVENOUS | Status: DC
  Administered 2016-10-09 (×2): via INTRAVENOUS

## 2016-10-09 MED ORDER — NEOSTIGMINE METHYLSULFATE 10 MG/10ML IV SOLN
INTRAVENOUS | Status: DC | PRN
  Administered 2016-10-09: 4 mg via INTRAVENOUS

## 2016-10-09 MED ORDER — THROMBIN 5000 UNITS EX SOLR
CUTANEOUS | Status: AC
  Filled 2016-10-09: qty 10000

## 2016-10-09 MED ORDER — LISINOPRIL 20 MG PO TABS
40.0000 mg | ORAL_TABLET | Freq: Every day | ORAL | Status: DC
  Administered 2016-10-09 – 2016-10-10 (×2): 40 mg via ORAL
  Filled 2016-10-09 (×2): qty 2

## 2016-10-09 MED ORDER — HYDROMORPHONE HCL 1 MG/ML IJ SOLN
0.5000 mg | INTRAMUSCULAR | Status: DC | PRN
  Administered 2016-10-09 – 2016-10-10 (×3): 0.5 mg via INTRAVENOUS
  Filled 2016-10-09 (×3): qty 0.5

## 2016-10-09 MED ORDER — BISACODYL 5 MG PO TBEC
5.0000 mg | DELAYED_RELEASE_TABLET | Freq: Every day | ORAL | Status: DC | PRN
Start: 2016-10-09 — End: 2016-10-10

## 2016-10-09 MED ORDER — HYDROCODONE-ACETAMINOPHEN 5-325 MG PO TABS
1.0000 | ORAL_TABLET | ORAL | Status: DC | PRN
  Administered 2016-10-09: 21:00:00 1 via ORAL
  Filled 2016-10-09: qty 1

## 2016-10-09 MED ORDER — METHOCARBAMOL 500 MG PO TABS
500.0000 mg | ORAL_TABLET | Freq: Four times a day (QID) | ORAL | Status: DC | PRN
  Administered 2016-10-09 – 2016-10-10 (×2): 500 mg via ORAL
  Filled 2016-10-09 (×3): qty 1

## 2016-10-09 MED ORDER — MIDAZOLAM HCL 5 MG/ML IJ SOLN
1.0000 mg | INTRAMUSCULAR | Status: DC | PRN
  Administered 2016-10-09: 2 mg via INTRAVENOUS

## 2016-10-09 MED ORDER — DEXAMETHASONE SODIUM PHOSPHATE 4 MG/ML IJ SOLN
INTRAMUSCULAR | Status: DC | PRN
Start: 2016-10-09 — End: 2016-10-09
  Administered 2016-10-09: 10 mg via INTRAVENOUS

## 2016-10-09 MED ORDER — OXYCODONE-ACETAMINOPHEN 5-325 MG PO TABS
2.0000 | ORAL_TABLET | ORAL | Status: DC | PRN
  Administered 2016-10-10 (×5): 2 via ORAL
  Filled 2016-10-09 (×5): qty 2

## 2016-10-09 MED ORDER — CEFAZOLIN SODIUM-DEXTROSE 2-4 GM/100ML-% IV SOLN
2.0000 g | INTRAVENOUS | Status: AC
  Administered 2016-10-09: 2 g via INTRAVENOUS
  Filled 2016-10-09: qty 100

## 2016-10-09 MED ORDER — GLYCOPYRROLATE 0.2 MG/ML IV SOSY
PREFILLED_SYRINGE | INTRAVENOUS | Status: AC
  Filled 2016-10-09: qty 5

## 2016-10-09 MED ORDER — ETOMIDATE 2 MG/ML IV SOLN
INTRAVENOUS | Status: AC
  Filled 2016-10-09: qty 10

## 2016-10-09 MED ORDER — PROPOFOL 10 MG/ML IV BOLUS
INTRAVENOUS | Status: DC | PRN
  Administered 2016-10-09 (×2): 40 mg via INTRAVENOUS

## 2016-10-09 MED ORDER — ACETAMINOPHEN 325 MG PO TABS: 650.0000 mg | ORAL_TABLET | Freq: Four times a day (QID) | ORAL | Status: DC | PRN

## 2016-10-09 MED ORDER — METHOCARBAMOL 1000 MG/10ML IJ SOLN
500.0000 mg | Freq: Four times a day (QID) | INTRAVENOUS | Status: DC | PRN
  Filled 2016-10-09: qty 5

## 2016-10-09 MED ORDER — LABETALOL HCL 100 MG PO TABS
600.0000 mg | ORAL_TABLET | Freq: Three times a day (TID) | ORAL | Status: DC
  Administered 2016-10-09 – 2016-10-10 (×3): 600 mg via ORAL
  Filled 2016-10-09 (×3): qty 6

## 2016-10-09 MED ORDER — METOCLOPRAMIDE HCL 5 MG PO TABS
5.0000 mg | ORAL_TABLET | Freq: Three times a day (TID) | ORAL | Status: DC | PRN
Start: 2016-10-09 — End: 2016-10-10

## 2016-10-09 MED ORDER — LIDOCAINE HCL (CARDIAC) 20 MG/ML IV SOLN
INTRAVENOUS | Status: DC | PRN
  Administered 2016-10-09: 50 mg via INTRAVENOUS

## 2016-10-09 MED ORDER — LIDOCAINE 2% (20 MG/ML) 5 ML SYRINGE
INTRAMUSCULAR | Status: AC
  Filled 2016-10-09: qty 5

## 2016-10-09 MED ORDER — BACITRACIN-NEOMYCIN-POLYMYXIN 400-5-5000 EX OINT
TOPICAL_OINTMENT | CUTANEOUS | Status: AC
  Filled 2016-10-09: qty 1

## 2016-10-09 MED ORDER — DEXAMETHASONE SODIUM PHOSPHATE 10 MG/ML IJ SOLN
INTRAMUSCULAR | Status: AC
  Filled 2016-10-09: qty 1

## 2016-10-09 MED ORDER — POLYMYXIN B SULFATE 500000 UNITS IJ SOLR
INTRAMUSCULAR | Status: AC
  Filled 2016-10-09: qty 500000

## 2016-10-09 MED ORDER — MIDAZOLAM HCL 2 MG/2ML IJ SOLN
INTRAMUSCULAR | Status: AC
  Filled 2016-10-09: qty 2

## 2016-10-09 MED ORDER — BUPIVACAINE-EPINEPHRINE (PF) 0.5% -1:200000 IJ SOLN
INTRAMUSCULAR | Status: DC | PRN
  Administered 2016-10-09: 15 mL via PERINEURAL

## 2016-10-09 MED ORDER — MENTHOL 3 MG MT LOZG
1.0000 | LOZENGE | OROMUCOSAL | Status: DC | PRN
  Filled 2016-10-09: qty 9

## 2016-10-09 MED ORDER — NEOSTIGMINE METHYLSULFATE 5 MG/5ML IV SOSY
PREFILLED_SYRINGE | INTRAVENOUS | Status: AC
  Filled 2016-10-09: qty 5

## 2016-10-09 MED ORDER — ONDANSETRON HCL 4 MG/2ML IJ SOLN
INTRAMUSCULAR | Status: AC
  Filled 2016-10-09: qty 2

## 2016-10-09 MED ORDER — PHENOL 1.4 % MT LIQD
1.0000 | OROMUCOSAL | Status: DC | PRN
  Filled 2016-10-09: qty 177

## 2016-10-09 MED ORDER — ACETAMINOPHEN 650 MG RE SUPP: 650.0000 mg | Freq: Four times a day (QID) | RECTAL | Status: DC | PRN

## 2016-10-09 MED ORDER — LACTATED RINGERS IV SOLN
INTRAVENOUS | Status: DC
  Administered 2016-10-09: 18:00:00 via INTRAVENOUS

## 2016-10-09 MED ORDER — SODIUM CHLORIDE 0.9 % IR SOLN
Status: DC | PRN
  Administered 2016-10-09: 500 mL

## 2016-10-09 MED ORDER — FENTANYL CITRATE (PF) 100 MCG/2ML IJ SOLN: 25.0000 ug | INTRAMUSCULAR | Status: DC | PRN

## 2016-10-09 MED ORDER — FENTANYL CITRATE (PF) 100 MCG/2ML IJ SOLN
50.0000 ug | INTRAMUSCULAR | Status: DC | PRN
  Administered 2016-10-09: 50 ug via INTRAVENOUS

## 2016-10-09 MED ORDER — BUPIVACAINE LIPOSOME 1.3 % IJ SUSP
20.0000 mL | Freq: Once | INTRAMUSCULAR | Status: DC
  Filled 2016-10-09: qty 20

## 2016-10-09 MED ORDER — FENTANYL CITRATE (PF) 100 MCG/2ML IJ SOLN
INTRAMUSCULAR | Status: AC
  Administered 2016-10-09: 50 ug via INTRAVENOUS
  Filled 2016-10-09: qty 2

## 2016-10-09 MED ORDER — FLEET ENEMA 7-19 GM/118ML RE ENEM: 1.0000 | ENEMA | Freq: Once | RECTAL | Status: DC | PRN

## 2016-10-09 MED ORDER — LANTHANUM CARBONATE 500 MG PO CHEW
2000.0000 mg | CHEWABLE_TABLET | Freq: Three times a day (TID) | ORAL | Status: DC
  Administered 2016-10-09 – 2016-10-10 (×4): 2000 mg via ORAL
  Filled 2016-10-09 (×4): qty 4

## 2016-10-09 MED ORDER — ONDANSETRON HCL 4 MG/2ML IJ SOLN
INTRAMUSCULAR | Status: DC | PRN
  Administered 2016-10-09: 4 mg via INTRAVENOUS

## 2016-10-09 MED ORDER — PHENYLEPHRINE HCL 10 MG/ML IJ SOLN
INTRAVENOUS | Status: DC | PRN
  Administered 2016-10-09: 50 ug/min via INTRAVENOUS

## 2016-10-09 MED ORDER — PHENYLEPHRINE HCL 10 MG/ML IJ SOLN
INTRAMUSCULAR | Status: AC
  Filled 2016-10-09: qty 1

## 2016-10-09 MED ORDER — GELATIN ABSORBABLE MT POWD
OROMUCOSAL | Status: DC | PRN
  Administered 2016-10-09: 20 mL via TOPICAL

## 2016-10-09 MED ORDER — ROCURONIUM BROMIDE 50 MG/5ML IV SOSY
PREFILLED_SYRINGE | INTRAVENOUS | Status: AC
  Filled 2016-10-09: qty 5

## 2016-10-09 MED ORDER — POLYETHYLENE GLYCOL 3350 17 G PO PACK: 17.0000 g | PACK | Freq: Every day | ORAL | Status: DC | PRN

## 2016-10-09 MED ORDER — CLONIDINE HCL 0.1 MG PO TABS
0.2000 mg | ORAL_TABLET | Freq: Two times a day (BID) | ORAL | Status: DC
  Administered 2016-10-09 – 2016-10-10 (×2): 0.2 mg via ORAL
  Filled 2016-10-09 (×2): qty 2

## 2016-10-09 MED ORDER — ETOMIDATE 2 MG/ML IV SOLN
INTRAVENOUS | Status: DC | PRN
  Administered 2016-10-09: 12 mg via INTRAVENOUS

## 2016-10-09 MED ORDER — GLYCOPYRROLATE 0.2 MG/ML IJ SOLN
INTRAMUSCULAR | Status: DC | PRN
  Administered 2016-10-09: 0.6 mg via INTRAVENOUS

## 2016-10-09 SURGICAL SUPPLY — 55 items
ADH SKN CLS APL DERMABOND .7 (GAUZE/BANDAGES/DRESSINGS) ×1
AGENT HMST SPONGE THK3/8 (HEMOSTASIS) ×1
ATTRACTOMAT 16X20 MAGNETIC DRP (DRAPES) ×3 IMPLANT
BAG SPEC THK2 15X12 ZIP CLS (MISCELLANEOUS)
BAG ZIPLOCK 12X15 (MISCELLANEOUS) IMPLANT
BLADE OSCILLATING/SAGITTAL (BLADE) ×3
BLADE SW THK.38XMED LNG THN (BLADE) ×1 IMPLANT
BNDG COHESIVE 6X5 TAN STRL LF (GAUZE/BANDAGES/DRESSINGS) ×3 IMPLANT
BUR OVAL CARBIDE 4.0 (BURR) ×3 IMPLANT
COVER SURGICAL LIGHT HANDLE (MISCELLANEOUS) ×3 IMPLANT
DERMABOND ADVANCED (GAUZE/BANDAGES/DRESSINGS) ×2
DERMABOND ADVANCED .7 DNX12 (GAUZE/BANDAGES/DRESSINGS) ×1 IMPLANT
DRAPE POUCH INSTRU U-SHP 10X18 (DRAPES) ×3 IMPLANT
DRSG AQUACEL AG ADV 3.5X 6 (GAUZE/BANDAGES/DRESSINGS) ×1 IMPLANT
DURAPREP 26ML APPLICATOR (WOUND CARE) ×3 IMPLANT
ELECT BLADE TIP CTD 4 INCH (ELECTRODE) IMPLANT
ELECT REM PT RETURN 15FT ADLT (MISCELLANEOUS) ×3 IMPLANT
GAUZE SPONGE 4X4 12PLY STRL (GAUZE/BANDAGES/DRESSINGS) ×2 IMPLANT
GLOVE BIOGEL PI IND STRL 6.5 (GLOVE) ×1 IMPLANT
GLOVE BIOGEL PI IND STRL 8 (GLOVE) ×1 IMPLANT
GLOVE BIOGEL PI INDICATOR 6.5 (GLOVE) ×2
GLOVE BIOGEL PI INDICATOR 8 (GLOVE)
GLOVE ECLIPSE 8.0 STRL XLNG CF (GLOVE) ×3 IMPLANT
GLOVE SURG SS PI 6.5 STRL IVOR (GLOVE) ×3 IMPLANT
GOWN STRL REUS W/TWL LRG LVL3 (GOWN DISPOSABLE) ×3 IMPLANT
GOWN STRL REUS W/TWL XL LVL3 (GOWN DISPOSABLE) ×3 IMPLANT
HEMOSTAT SPONGE AVITENE ULTRA (HEMOSTASIS) ×3 IMPLANT
KIT BASIN OR (CUSTOM PROCEDURE TRAY) ×3 IMPLANT
KIT POSITION SHOULDER SCHLEI (MISCELLANEOUS) ×3 IMPLANT
MANIFOLD NEPTUNE II (INSTRUMENTS) ×3 IMPLANT
NDL MA TROC 1/2 (NEEDLE) IMPLANT
NEEDLE MA TROC 1/2 (NEEDLE) IMPLANT
NS IRRIG 1000ML POUR BTL (IV SOLUTION) ×2 IMPLANT
PACK SHOULDER (CUSTOM PROCEDURE TRAY) ×3 IMPLANT
PAD ABD 8X10 STRL (GAUZE/BANDAGES/DRESSINGS) ×2 IMPLANT
POSITIONER SURGICAL ARM (MISCELLANEOUS) ×3 IMPLANT
SLING ARM FOAM STRAP SML (SOFTGOODS) ×2 IMPLANT
SLING ARM IMMOBILIZER LRG (SOFTGOODS) ×3 IMPLANT
SPONGE LAP 4X18 X RAY DECT (DISPOSABLE) IMPLANT
SPONGE SURGIFOAM ABS GEL 100 (HEMOSTASIS) ×3 IMPLANT
STAPLER VISISTAT 35W (STAPLE) IMPLANT
SUCTION FRAZIER HANDLE 12FR (TUBING) ×2
SUCTION TUBE FRAZIER 12FR DISP (TUBING) ×1 IMPLANT
SUT BONE WAX W31G (SUTURE) ×3 IMPLANT
SUT ETHIBOND NAB CT1 #1 30IN (SUTURE) ×3 IMPLANT
SUT MNCRL AB 4-0 PS2 18 (SUTURE) ×3 IMPLANT
SUT VIC AB 0 CT1 27 (SUTURE)
SUT VIC AB 0 CT1 27XBRD ANTBC (SUTURE) IMPLANT
SUT VIC AB 1 CT1 27 (SUTURE) ×6
SUT VIC AB 1 CT1 27XBRD ANTBC (SUTURE) ×2 IMPLANT
SUT VIC AB 2-0 CT1 27 (SUTURE) ×6
SUT VIC AB 2-0 CT1 TAPERPNT 27 (SUTURE) ×2 IMPLANT
SWAB COLLECTION DEVICE MRSA (MISCELLANEOUS) ×2 IMPLANT
TAPE CLOTH SURG 6X10 WHT LF (GAUZE/BANDAGES/DRESSINGS) ×2 IMPLANT
TOWEL OR 17X26 10 PK STRL BLUE (TOWEL DISPOSABLE) ×3 IMPLANT

## 2016-10-09 NOTE — Op Note (Signed)
[name], [name]                 ACCOUNT NO.:  [id number]  MEDICAL RECORD NO.:  [id number]  LOCATION:                                 FACILITY:  PHYSICIAN:  [name], M.D.DATE OF BIRTH:  1960/09/09  DATE OF PROCEDURE:  10/09/2016 DATE OF DISCHARGE:                              OPERATIVE REPORT   SURGEON:  [name], M.D.  ASSISTANT:  OR nurse.  PREOPERATIVE DIAGNOSES: 1. Complete disruption of the right rotator cuff. 2. Severe extensive chronic synovitis, right shoulder. 3. 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  She is a dialysis     patient.  POSTOPERATIVE DIAGNOSES: 1. Complete disruption of the right rotator cuff. 2. Severe extensive chronic synovitis, right shoulder. 3. 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  She is a dialysis     patient.  OPERATION: 1. Open extensive synovectomy of the right shoulder. 2. Cultures for aerobic and anaerobic bacteria were done. 3. Exploration and debridement of the remaining rotator cuff. 4. Open acromionectomy. 5. Debridement, proximal humerus.  DESCRIPTION OF PROCEDURE:  Under general anesthesia, routine orthopedic prep and draping of the right shoulder was carried out with the patient in a beach-chair position.  The patient had 2 g of IV Ancef.  The appropriate time-out was carried out.  I also marked the appropriate right shoulder in the holding area.  An incision was made over the anterior aspect of the right shoulder.  Great care was taken not to interfere with her portal for her dialysis, which was over the biceps. At this time, the incision was carried down through the deltoid.  The deltoid tendon was removed from the acromion by sharp dissection. Immediately upon going down to the shoulder, she had an extensive synovitis.  There was a large amount of fluid literally poured out of the joint.  This fluid was cultured for aerobic and anaerobic and Gram stain.  Note, the fluid also was cultured and  sent for the gout, etc., two separate occasions in the office with an office aspiration.  At this particular point, I then did an extensive synovectomy.  I literally milked the synovium out from up under the deltoid, it was extended medially, laterally, superiorly and inferiorly.  I took a great deal of time to do this debridement.  There obviously was quite a bit of small venous bleeding.  We did cauterize it at all times and kept the bleeding under good control because she is anemic.  After this, I noted that the synovium literally eroded into the proximal humerus.  I debrided the proximal humerus, cleaned all that out as well as we could.  I then went up and did an open acromionectomy, so I could have more access to get back posteriorly to examine the cuff.  There literally was no more cuff left.  It was thoroughly disintegrated.  I debrided the remaining cup and did more of a synovectomy as well.  I then thoroughly irrigated out the shoulder.  I made multiple passes in regard to the synovectomy. Once again, great care was taken not to interfere with her dialysis portal.  After this, I loosely applied some thrombin-soaked Gelfoam.  I then reapproximated the deltoid tendon and muscle in usual fashion. Subcu was closed with 2-0 Vicryl and I used staples to close the wound because of the nature of her skin.  I then placed a sterile Neosporin dressing on her and placed her in a shoulder sling.  She will be admitted.  She did have an interscalene nerve block preop.  At this time, we will have her ready for her dialysis tomorrow.          ______________________________ Connie Brood Gladstone [name], M.D.     RAG/MEDQ  D:  10/09/2016  T:  10/09/2016  Job:  [id number]

## 2016-10-09 NOTE — Anesthesia Preprocedure Evaluation (Addendum)
Anesthesia Evaluation  Patient identified by MRN, date of birth, ID band Patient awake    Reviewed: Allergy & Precautions, NPO status , Patient's Chart, lab work & pertinent test results  Airway Mallampati: I  TM Distance: >3 FB Neck ROM: Full    Dental  (+) Teeth Intact, Dental Advisory Given   Pulmonary Current Smoker,    breath sounds clear to auscultation       Cardiovascular hypertension, +CHF  + Valvular Problems/Murmurs AS  Rhythm:Regular Rate:Normal     Neuro/Psych CVA negative psych ROS   GI/Hepatic Neg liver ROS, PUD, GERD  Medicated,  Endo/Other  negative endocrine ROS  Renal/GU ESRF and DialysisRenal disease  negative genitourinary   Musculoskeletal negative musculoskeletal ROS (+)   Abdominal   Peds negative pediatric ROS (+)  Hematology negative hematology ROS (+)   Anesthesia Other Findings   Reproductive/Obstetrics negative OB ROS                            Lab Results  Component Value Date   WBC 6.5 10/09/2016   HGB 8.7 (L) 10/09/2016   HCT 28.5 (L) 10/09/2016   MCV 97.3 10/09/2016   PLT 308 10/09/2016   Lab Results  Component Value Date   INR 1.00 10/09/2016   INR 1.05 07/25/2014   Echo: - Left ventricle: The cavity size was normal. There was moderate   concentric hypertrophy. Systolic function was normal. The   estimated ejection fraction was in the range of 60% to 65%. Wall   motion was normal; there were no regional wall motion   abnormalities. Features are consistent with a pseudonormal left   ventricular filling pattern, with concomitant abnormal relaxation   and increased filling pressure (grade 2 diastolic dysfunction).   Doppler parameters are consistent with elevated ventricular   end-diastolic filling pressure. - Aortic valve: Bicuspid; moderately thickened, moderately   calcified leaflets. There was mild to moderate stenosis. Mean   gradient (S):  18 mm Hg. Peak gradient (S): 38 mm Hg. Valve area   (VTI): 1.84 cm^2. Valve area (Vmax): 1.47 cm^2. Valve area   (Vmean): 1.61 cm^2. - Aortic root: The aortic root was normal in size. - Mitral valve: Calcified annulus. Severely thickened, moderately   calcified leaflets . The findings are consistent with moderate   stenosis. There was mild to moderate regurgitation. Valve area by   continuity equation (using LVOT flow): 1.76 cm^2. - Left atrium: The atrium was severely dilated. - Right ventricle: The cavity size was normal. Wall thickness was   normal. Systolic function was normal. - Right atrium: The atrium was mildly dilated. - Tricuspid valve: There was mild regurgitation. - Pulmonary arteries: Systolic pressure was within the normal   range.  Anesthesia Physical Anesthesia Plan  ASA: III  Anesthesia Plan: General   Post-op Pain Management:  Regional for Post-op pain   Induction: Intravenous  Airway Management Planned: Oral ETT  Additional Equipment:   Intra-op Plan:   Post-operative Plan: Extubation in OR  Informed Consent: I have reviewed the patients History and Physical, chart, labs and discussed the procedure including the risks, benefits and alternatives for the proposed anesthesia with the patient or authorized representative who has indicated his/her understanding and acceptance.   Dental advisory given  Plan Discussed with: CRNA  Anesthesia Plan Comments:         Anesthesia Quick Evaluation

## 2016-10-09 NOTE — Interval H&P Note (Signed)
History and Physical Interval Note:  10/09/2016 12:45 PM  [name]  has presented today for surgery, with the diagnosis of Right shoulder rotator cuff tear  The various methods of treatment have been discussed with the patient and family. After consideration of risks, benefits and other options for treatment, the patient has consented to  Procedure(s) with comments: [name] with possible graft and anchors, SAD, DCR (Right) - RNFA as a surgical intervention .  The patient's history has been reviewed, patient examined, no change in status, stable for surgery.  I have reviewed the patient's chart and labs.  Questions were answered to the patient's satisfaction.     Denson Niccoli A

## 2016-10-09 NOTE — H&P (Signed)
[name] is an 56 y.o. female.   Chief Complaint: pain and swelling of Right Shoulder HPI: History of Chronic Anemia and is on Dialysis. She has had several aspirations of her shoulder for chronic synovitis. She complains of inability to elevate her Right shoulder.  Past Medical History:  Diagnosis Date  . Anemia   . Aortic stenosis 09/25/2016   Echo 07/24/16: Mod conc LVH, EF 60-65, no RWMA, Gr 2 DDd, bicuspid aortic valve, mild to mod AS (mean 18, peak 38), MAC, mod mitral stenosis (mean 9, peak 19), mild to mod MR, severe LAE, normal RVSF, mild RAE, mild TR  . Blood transfusion '08   [facility]  . Chronic diastolic CHF (congestive heart failure) ([place])   . Dysfunctional uterine bleeding 12/19/2010  . ESRD (end stage renal disease) ([place])    dialysis   Mon Wed Fri  . GERD (gastroesophageal reflux disease)   . Hemodialysis patient [facility])    right extremity port  . Hx of cardiovascular stress test    Lexiscan Myoview 4/16:  Normal stress nuclear study, EF 59%  . Hx of hiatal hernia   . Hypertension   . Lupus   . Mitral stenosis    Echo 4/16:  EF 55-60%, no RWMA, Gr 1 DD, mod MS (mean 9 mmHg), mod LAE, mild RAE, PASP 65, mod to severe TR, trivial eff  . Peptic ulcer disease   . Stroke [facility])    per patient "they said i had a small stroke but i couldnt even tell"    Past Surgical History:  Procedure Laterality Date  . DIALYSIS FISTULA CREATION  2007  . ENDOMETRIAL ABLATION    . ESOPHAGOGASTRODUODENOSCOPY N/A 07/31/2014   Procedure: ESOPHAGOGASTRODUODENOSCOPY (EGD);  Surgeon: Clarene Essex, MD;  Location: [facility] ENDOSCOPY;  Service: Endoscopy;  Laterality: N/A;    Family History  Problem Relation Age of Onset  . Liver cancer Maternal Grandmother   . Lymphoma Maternal Aunt   . Heart attack Neg Hx    Social History:  reports that she has been smoking Cigarettes.  She has been smoking about 0.25 packs per day. She has never used smokeless tobacco. She reports that she drinks alcohol. She reports  that she uses drugs, including Marijuana.  Allergies: Not on File  Medications Prior to Admission  Medication Sig Dispense Refill  . acetaminophen (TYLENOL) 500 MG tablet Take 1,000 mg by mouth every 6 (six) hours as needed (for pain.).    [name] amLODipine (NORVASC) 10 MG tablet Take 10 mg by mouth at bedtime.     [name] aspirin EC 81 MG tablet Take 81 mg by mouth at bedtime.    . cloNIDine (CATAPRES) 0.2 MG tablet Take 1 tablet (0.2 mg total) by mouth 2 (two) times daily. 180 tablet 3  . labetalol (NORMODYNE) 300 MG tablet Take 600 mg by mouth 3 (three) times daily.     [name] lanthanum (FOSRENOL) 1000 MG chewable tablet Chew 2 tablets (2,000 mg total) by mouth 3 (three) times daily with meals. (Patient taking differently: Chew 1,000-2,000 mg by mouth 3 (three) times daily with meals. ) 180 tablet 0  . lisinopril (PRINIVIL,ZESTRIL) 40 MG tablet Take 40 mg by mouth daily at 6 PM.   3  . Naphazoline-Glycerin (CLEAR EYES MAX REDNESS RELIEF) 0.03-0.5 % SOLN Place 1-2 drops into both eyes 3 (three) times daily as needed (for dry/irritated eyes).    . pantoprazole (PROTONIX) 40 MG tablet Take 1 tablet (40 mg total) by mouth 2 (two) times  daily. 60 tablet 0  . metroNIDAZOLE (FLAGYL) 500 MG tablet Take 1 tablet (500 mg total) by mouth 2 (two) times daily. (Patient not taking: Reported on 10/02/2016) 14 tablet 2    Results for orders placed or performed during the hospital encounter of 10/09/16 (from the past 48 hour(s))  CBC WITH DIFFERENTIAL     Status: Abnormal   Collection Time: 10/09/16 11:00 AM  Result Value Ref Range   WBC 6.5 4.0 - 10.5 K/uL   RBC 2.93 (L) 3.87 - 5.11 MIL/uL   Hemoglobin 8.7 (L) 12.0 - 15.0 g/dL   HCT 28.5 (L) 36.0 - 46.0 %   MCV 97.3 78.0 - 100.0 fL   MCH 29.7 26.0 - 34.0 pg   MCHC 30.5 30.0 - 36.0 g/dL   RDW 17.7 (H) 11.5 - 15.5 %   Platelets 308 150 - 400 K/uL   Neutrophils Relative % 76 %   Neutro Abs 5.0 1.7 - 7.7 K/uL   Lymphocytes Relative 15 %   Lymphs Abs 1.0 0.7 - 4.0  K/uL   Monocytes Relative 8 %   Monocytes Absolute 0.5 0.1 - 1.0 K/uL   Eosinophils Relative 1 %   Eosinophils Absolute 0.1 0.0 - 0.7 K/uL   Basophils Relative 0 %   Basophils Absolute 0.0 0.0 - 0.1 K/uL  Protime-INR     Status: None   Collection Time: 10/09/16 11:00 AM  Result Value Ref Range   Prothrombin Time 13.2 11.4 - 15.2 seconds   INR 1.00   Comprehensive metabolic panel     Status: Abnormal   Collection Time: 10/09/16 11:00 AM  Result Value Ref Range   Sodium 137 135 - 145 mmol/L   Potassium 3.7 3.5 - 5.1 mmol/L   Chloride 95 (L) 101 - 111 mmol/L   CO2 29 22 - 32 mmol/L   Glucose, Bld 97 65 - 99 mg/dL   BUN 27 (H) 6 - 20 mg/dL   Creatinine, Ser 5.51 (H) 0.44 - 1.00 mg/dL   Calcium 9.6 8.9 - 10.3 mg/dL   Total Protein 7.5 6.5 - 8.1 g/dL   Albumin 3.3 (L) 3.5 - 5.0 g/dL   AST 18 15 - 41 U/L   ALT 14 14 - 54 U/L   Alkaline Phosphatase 91 38 - 126 U/L   Total Bilirubin 0.7 0.3 - 1.2 mg/dL   GFR calc non Af Amer 8 (L) >60 mL/min   GFR calc Af Amer 9 (L) >60 mL/min    Comment: (NOTE) The eGFR has been calculated using the CKD EPI equation. This calculation has not been validated in all clinical situations. eGFR's persistently <60 mL/min signify possible Chronic Kidney Disease.    Anion gap 13 5 - 15   No results found.  Review of Systems  Constitutional: Positive for malaise/fatigue.  HENT: Negative.   Eyes: Negative.   Respiratory: Negative.   Cardiovascular: Negative.   Gastrointestinal: Negative.   Genitourinary:       She is on Renal Dialysis  Musculoskeletal: Positive for joint pain.  Skin: Negative.   Neurological: Positive for weakness.  Endo/Heme/Allergies: Negative.   Psychiatric/Behavioral: Negative.     Blood pressure (!) 210/107, pulse 81, temperature 98.5 F (36.9 C), temperature source Oral, resp. rate (!) 21, height 5' 6"  (1.676 m), weight 58.5 kg (129 lb), last menstrual period 12/05/2010, SpO2 97 %. Physical Exam    Assessment/Plan Synovectomy of right shoulder and repair of Right Shoulder   [name] A, MD 10/09/2016, 12:39 PM

## 2016-10-09 NOTE — Anesthesia Procedure Notes (Signed)
Procedure Name: Intubation Date/Time: 10/09/2016 12:58 PM Performed by: Claudia Desanctis Pre-anesthesia Checklist: Patient identified, Emergency Drugs available, Suction available and Patient being monitored Patient Re-evaluated:Patient Re-evaluated prior to inductionOxygen Delivery Method: Circle system utilized Preoxygenation: Pre-oxygenation with 100% oxygen Intubation Type: IV induction Ventilation: Mask ventilation without difficulty Laryngoscope Size: 2 and Miller Grade View: Grade I Tube type: Oral Laser Tube: Cuffed inflated with minimal occlusive pressure - saline Tube size: 7.0 mm Number of attempts: 1 Airway Equipment and Method: Stylet Placement Confirmation: ETT inserted through vocal cords under direct vision,  positive ETCO2 and breath sounds checked- equal and bilateral Secured at: 21 cm Tube secured with: Tape Dental Injury: Teeth and Oropharynx as per pre-operative assessment

## 2016-10-09 NOTE — Brief Op Note (Signed)
10/09/2016  2:35 PM  PATIENT:  [name]  56 y.o. 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  POST-OPERATIVE DIAGNOSIS: Same as Pre-Op  PROCEDURE:  Open Acromionectomy;EXTENSIVE Synovectomy ;Exploration and Debridement of Rotator Cuff and Debridement of Proximal Humerus. SURGEON:  Surgeon(s) and Role:    * Latanya [name], MD - Primary  PHYSICIAN ASSISTANT: none  ASSISTANTS: OR Nurse  ANESTHESIA:   general  EBL:  Total I/O In: 300 [I.V.:300] Out: 100 [Blood:100]  BLOOD ADMINISTERED:none  DRAINS: none   LOCAL MEDICATIONS USED:  NONE  SPECIMEN:  Source of Specimen:  Right Shoulder  DISPOSITION OF SPECIMEN:  Source of Specimen:  Right Shoulder   COUNTS:  YES  TOURNIQUET:  * No tourniquets in log *  DICTATION: .Other Dictation: Dictation Number [id number]  PLAN OF CARE: Admit for overnight observation  PATIENT DISPOSITION:  PACU - hemodynamically stable.   Delay start of Pharmacological VTE agent (>24hrs) due to surgical blood loss or risk of bleeding: yes

## 2016-10-09 NOTE — Anesthesia Procedure Notes (Signed)
Anesthesia Regional Block: Interscalene brachial plexus block   Pre-Anesthetic Checklist: ,, timeout performed, Correct Patient, Correct Site, Correct Laterality, Correct Procedure, Correct Position, site marked, Risks and benefits discussed,  Surgical consent,  Pre-op evaluation,  At surgeon's request and post-op pain management  Laterality: Right  Prep: chloraprep       Needles:  Injection technique: Single-shot  Needle Type: Echogenic Needle     Needle Length: 9cm  Needle Gauge: 21     Additional Needles:   Procedures: ultrasound guided,,,,,,,,  Narrative:  Start time: 10/09/2016 12:25 PM End time: 10/09/2016 12:30 PM Injection made incrementally with aspirations every 5 mL.  Performed by: Personally  Anesthesiologist: Suella Broad D  Additional Notes: Tolerated well.

## 2016-10-09 NOTE — Anesthesia Postprocedure Evaluation (Addendum)
Anesthesia Post Note  Patient: [name]  Procedure(s) Performed: Procedure(s) (LRB): ROTATOR CUFF REPAIR SHOULDER OPEN partial acrominectomy and extensive synovectomy (Right)  Patient location during evaluation: PACU Anesthesia Type: General and Regional Level of consciousness: awake and alert Pain management: pain level controlled Vital Signs Assessment: post-procedure vital signs reviewed and stable Respiratory status: spontaneous breathing, nonlabored ventilation, respiratory function stable and patient connected to nasal cannula oxygen Cardiovascular status: blood pressure returned to baseline and stable Postop Assessment: no signs of nausea or vomiting Anesthetic complications: no       Last Vitals:  Vitals:   10/09/16 1500 10/09/16 1515  BP: (!) 167/86 (!) 176/94  Pulse: 76 79  Resp: 16 18  Temp: 36.4 C     Last Pain:  Vitals:   10/09/16 1515  TempSrc:   PainSc: 0-No pain                 Effie Berkshire

## 2016-10-09 NOTE — Progress Notes (Signed)
AssistedDr. Hollis with right, ultrasound guided, interscalene  block. Side rails up, monitors on throughout procedure. See vital signs in flow sheet. Tolerated Procedure well.  

## 2016-10-09 NOTE — Transfer of Care (Signed)
Immediate Anesthesia Transfer of Care Note  Patient: [name]  Procedure(s) Performed: Procedure(s) with comments: ROTATOR CUFF REPAIR SHOULDER OPEN partial acrominectomy and extensive synovectomy (Right) - RNFA  Patient Location: PACU  Anesthesia Type:General  Level of Consciousness:  sedated, patient cooperative and responds to stimulation  Airway & Oxygen Therapy:Patient Spontanous Breathing and Patient connected to face mask oxgen  Post-op Assessment:  Report given to PACU RN and Post -op Vital signs reviewed and stable  Post vital signs:  Reviewed and stable  Last Vitals:  Vitals:   10/09/16 1238 10/09/16 1240  BP:    Pulse: 84   Resp: 20 20  Temp:      Complications: No apparent anesthesia complications

## 2016-10-10 ENCOUNTER — Encounter (HOSPITAL_COMMUNITY): Payer: Self-pay | Admitting: Orthopedic Surgery

## 2016-10-10 DIAGNOSIS — I5032 Chronic diastolic (congestive) heart failure: Secondary | ICD-10-CM | POA: Diagnosis not present

## 2016-10-10 DIAGNOSIS — I132 Hypertensive heart and chronic kidney disease with heart failure and with stage 5 chronic kidney disease, or end stage renal disease: Secondary | ICD-10-CM | POA: Diagnosis not present

## 2016-10-10 DIAGNOSIS — D631 Anemia in chronic kidney disease: Secondary | ICD-10-CM | POA: Diagnosis not present

## 2016-10-10 DIAGNOSIS — M659 Synovitis and tenosynovitis, unspecified: Secondary | ICD-10-CM | POA: Diagnosis not present

## 2016-10-10 DIAGNOSIS — N186 End stage renal disease: Secondary | ICD-10-CM | POA: Diagnosis not present

## 2016-10-10 DIAGNOSIS — M75101 Unspecified rotator cuff tear or rupture of right shoulder, not specified as traumatic: Secondary | ICD-10-CM | POA: Diagnosis not present

## 2016-10-10 MED ORDER — PROMETHAZINE HCL 25 MG/ML IJ SOLN
6.2500 mg | Freq: Four times a day (QID) | INTRAMUSCULAR | Status: DC | PRN
  Administered 2016-10-10: 12.5 mg via INTRAVENOUS
  Filled 2016-10-10: qty 1

## 2016-10-10 MED ORDER — POLYETHYLENE GLYCOL 3350 17 G PO PACK: 17.0000 g | PACK | Freq: Every day | ORAL | 0 refills | Status: DC | PRN

## 2016-10-10 MED ORDER — OXYCODONE-ACETAMINOPHEN 5-325 MG PO TABS: 1.0000 | ORAL_TABLET | ORAL | 0 refills | Status: DC | PRN

## 2016-10-10 NOTE — Progress Notes (Addendum)
Subjective: 1 Day Post-Op Procedure(s) (LRB): ROTATOR CUFF REPAIR SHOULDER OPEN partial acrominectomy and extensive synovectomy (Right) Patient reports pain as 3 on 0-10 scale.Doing well. Dressing is dry. HBgis stable at 8.5. She will have Dialysis today.    Objective: Vital signs in last 24 hours: Temp:  [97.6 F (36.4 C)-98.5 F (36.9 C)] 98.3 F (36.8 C) (05/23 0544) Pulse Rate:  [75-87] 75 (05/23 0544) Resp:  [0-23] 16 (05/23 0544) BP: (145-210)/(79-107) 169/87 (05/23 0544) SpO2:  [94 %-100 %] 99 % (05/23 0544) Weight:  [58.5 kg (129 lb)] 58.5 kg (129 lb) (05/22 1053)  Intake/Output from previous day: 05/22 0701 - 05/23 0700 In: 1497.5 [P.O.:600; I.V.:747.5; IV Piggyback:150] Out: 320 [Urine:220; Blood:100] Intake/Output this shift: No intake/output data recorded.   Recent Labs  10/09/16 1100 10/09/16 2044  HGB 8.7* 8.5*    Recent Labs  10/09/16 1100 10/09/16 2044  WBC 6.5  --   RBC 2.93*  --   HCT 28.5* 28.5*  PLT 308  --     Recent Labs  10/09/16 1100  NA 137  K 3.7  CL 95*  CO2 29  BUN 27*  CREATININE 5.51*  GLUCOSE 97  CALCIUM 9.6    Recent Labs  10/09/16 1100  INR 1.00    Neurologically intact  Assessment/Plan: 1 Day Post-Op Procedure(s) (LRB): ROTATOR CUFF REPAIR SHOULDER OPEN partial acrominectomy and extensive synovectomy (Right) Discharge home    GIOFFRE,RONALD A 10/10/2016, 7:22 AM

## 2016-10-10 NOTE — Evaluation (Signed)
Occupational Therapy Evaluation Patient Details Name: [name] MRN: [id number] DOB: 1961/04/14 Today's Date: 10/10/2016    History of Present Illness pt is s/p R open RCR, partial acrominectomy, and extensive synovectomy.  On dialysis   Clinical Impression   This 56 year old female was admitted for the above. All education was completed.  Eval was performed in 2 parts due to nausea.  Mother present for education    Follow Up Recommendations  DC plan and follow up therapy as arranged by surgeon    Equipment Recommendations  None recommended by OT    Recommendations for Other Services       Precautions / Restrictions Precautions Precautions: Shoulder Type of Shoulder Precautions: conservative protocol:  sling off only for bathing/dressing Shoulder Interventions: Shoulder sling/immobilizer Precaution Booklet Issued: Yes (comment) Precaution Comments: reviewed precautions and protocol Restrictions Weight Bearing Restrictions: Yes RUE Weight Bearing: Non weight bearing      Mobility Bed Mobility Overal bed mobility: Modified Independent (HOB raised)                Transfers Overall transfer level: Needs assistance Equipment used: None Transfers: Sit to/from Stand Sit to Stand: Supervision              Balance                                           ADL either performed or assessed with clinical judgement   ADL Overall ADL's : Needs assistance/impaired     Grooming: Set up;Sitting;Wash/dry hands   Upper Body Bathing: Moderate assistance;Sitting   Lower Body Bathing: Moderate assistance;Sit to/from stand   Upper Body Dressing : Maximal assistance;Sitting   Lower Body Dressing: Maximal assistance;Sit to/from stand   Toilet Transfer: Min guard;Ambulation;Comfort height toilet   Toileting- Clothing Manipulation and Hygiene: Supervision/safety;Sitting/lateral lean         General ADL Comments: during first visit, pt was  limited by nausea/dry heaves. She needs smaller immobilizer:  contacted ortho tech.  Reviewed protocol.  Returned in late morning and performed UB ADL.  Mother present. Had requested medium sling/immobilizer.  Pt felt better support of hand with Large, but she has difficulty keeping thumb in loop and sling slips forward.  Medium is proximal to knuckles. She has both and can use either one.  Mother verbalizes understanding of donning sling. See education section of chart     Vision         Perception     Praxis      Pertinent Vitals/Pain Pain Assessment: Faces Faces Pain Scale: Hurts little more Pain Location: R shoulder with movement Pain Descriptors / Indicators: Aching Pain Intervention(s): Limited activity within patient's tolerance;Monitored during session;Premedicated before session;Repositioned     Hand Dominance Right   Extremity/Trunk Assessment Upper Extremity Assessment Upper Extremity Assessment: RUE deficits/detail RUE Deficits / Details: immobilized. Able to move fingers and wrist           Communication Communication Communication: No difficulties   Cognition Arousal/Alertness: Awake/alert Behavior During Therapy: WFL for tasks assessed/performed Overall Cognitive Status: Within Functional Limits for tasks assessed                                     General Comments       Exercises     Shoulder  Instructions      Home Living Family/patient expects to be discharged to:: Private residence Living Arrangements: Alone Available Help at Discharge: Family               Bathroom Shower/Tub: Teacher, early years/pre: Standard                Prior Functioning/Environment Level of Independence: Independent                 OT Problem List:        OT Treatment/Interventions:      OT Goals(Current goals can be found in the care plan section) Acute Rehab OT Goals Patient Stated Goal: less pain; hopefully a good  recovery/movement return OT Goal Formulation: All assessment and education complete, DC therapy  OT Frequency:     Barriers to D/C:            Co-evaluation              AM-PAC PT "6 Clicks" Daily Activity     Outcome Measure Help from another person eating meals?: A Little Help from another person taking care of personal grooming?: A Little Help from another person toileting, which includes using toliet, bedpan, or urinal?: A Little Help from another person bathing (including washing, rinsing, drying)?: A Lot Help from another person to put on and taking off regular upper body clothing?: A Lot Help from another person to put on and taking off regular lower body clothing?: A Lot 6 Click Score: 15   End of Session    Activity Tolerance: Patient tolerated treatment well Patient left: in bed;with call bell/phone within reach;with family/visitor present  OT Visit Diagnosis: Pain Pain - Right/Left: Right Pain - part of body: Shoulder                Time: 1047-1110 and 1205 - 1235 OT Time Calculation (min): 53 min Charges:  OT General Charges $OT Visit: 1 Procedure OT Evaluation $OT Eval Low Complexity: 1 Procedure OT Treatments $Self Care/Home Management : 23-37 mins G-Codes: OT G-codes **NOT FOR INPATIENT CLASS** Functional Assessment Tool Used: Clinical judgement Functional Limitation: Self care Self Care Current Status (D6644): At least 20 percent but less than 40 percent impaired, limited or restricted Self Care Goal Status (I3474): At least 20 percent but less than 40 percent impaired, limited or restricted Self Care Discharge Status [phone]): At least 20 percent but less than 40 percent impaired, limited or restricted   Lesle Chris, OTR/L [phone] 10/10/2016  [name] 10/10/2016, 1:07 PM

## 2016-10-11 DIAGNOSIS — D509 Iron deficiency anemia, unspecified: Secondary | ICD-10-CM | POA: Diagnosis not present

## 2016-10-11 DIAGNOSIS — D631 Anemia in chronic kidney disease: Secondary | ICD-10-CM | POA: Diagnosis not present

## 2016-10-11 DIAGNOSIS — M3210 Systemic lupus erythematosus, organ or system involvement unspecified: Secondary | ICD-10-CM | POA: Diagnosis not present

## 2016-10-11 DIAGNOSIS — N186 End stage renal disease: Secondary | ICD-10-CM | POA: Diagnosis not present

## 2016-10-11 DIAGNOSIS — N2581 Secondary hyperparathyroidism of renal origin: Secondary | ICD-10-CM | POA: Diagnosis not present

## 2016-10-11 NOTE — Progress Notes (Signed)
OT Note:    Family came up to [place] stating that pt left her AE bag here.  I did not issue a bag yesterday.  Pt is R hand dominant.  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  [place], [place] [phone] 10/11/2016

## 2016-10-12 DIAGNOSIS — M3210 Systemic lupus erythematosus, organ or system involvement unspecified: Secondary | ICD-10-CM | POA: Diagnosis not present

## 2016-10-12 DIAGNOSIS — N186 End stage renal disease: Secondary | ICD-10-CM | POA: Diagnosis not present

## 2016-10-12 DIAGNOSIS — D631 Anemia in chronic kidney disease: Secondary | ICD-10-CM | POA: Diagnosis not present

## 2016-10-12 DIAGNOSIS — D509 Iron deficiency anemia, unspecified: Secondary | ICD-10-CM | POA: Diagnosis not present

## 2016-10-12 DIAGNOSIS — N2581 Secondary hyperparathyroidism of renal origin: Secondary | ICD-10-CM | POA: Diagnosis not present

## 2016-10-14 LAB — AEROBIC/ANAEROBIC CULTURE W GRAM STAIN (SURGICAL/DEEP WOUND)

## 2016-10-14 LAB — AEROBIC/ANAEROBIC CULTURE (SURGICAL/DEEP WOUND): CULTURE: NO GROWTH

## 2016-10-15 DIAGNOSIS — N186 End stage renal disease: Secondary | ICD-10-CM | POA: Diagnosis not present

## 2016-10-15 DIAGNOSIS — M3210 Systemic lupus erythematosus, organ or system involvement unspecified: Secondary | ICD-10-CM | POA: Diagnosis not present

## 2016-10-15 DIAGNOSIS — D509 Iron deficiency anemia, unspecified: Secondary | ICD-10-CM | POA: Diagnosis not present

## 2016-10-15 DIAGNOSIS — N2581 Secondary hyperparathyroidism of renal origin: Secondary | ICD-10-CM | POA: Diagnosis not present

## 2016-10-15 DIAGNOSIS — D631 Anemia in chronic kidney disease: Secondary | ICD-10-CM | POA: Diagnosis not present

## 2016-10-16 NOTE — Discharge Summary (Signed)
Physician Discharge Summary  Patient ID: [name] MRN: [id number] DOB/AGE: December 16, 1960 56 y.o.  Admit date: 10/09/2016 Discharge date: 10/10/2016   Procedures:  Procedure(s) (LRB): ROTATOR CUFF REPAIR SHOULDER OPEN partial acrominectomy and extensive synovectomy (Right)  Attending Physician:  Dr. Paralee Cancel   Admission Diagnoses:   Pain and swelling of right shoulder  Discharge Diagnoses:  Active Problems:   Rotator cuff tear arthropathy, right  Past Medical History:  Diagnosis Date  . Anemia   . Aortic stenosis 09/25/2016   Echo 07/24/16: Mod conc LVH, EF 60-65, no RWMA, Gr 2 DDd, bicuspid aortic valve, mild to mod AS (mean 18, peak 38), MAC, mod mitral stenosis (mean 9, peak 19), mild to mod MR, severe LAE, normal RVSF, mild RAE, mild TR  . Blood transfusion '08   [facility]  . Chronic diastolic CHF (congestive heart failure) ([place])   . Dysfunctional uterine bleeding 12/19/2010  . ESRD (end stage renal disease) ([place])    dialysis   Mon Wed Fri  . GERD (gastroesophageal reflux disease)   . Hemodialysis patient [facility])    right extremity port  . Hx of cardiovascular stress test    Lexiscan Myoview 4/16:  Normal stress nuclear study, EF 59%  . Hx of hiatal hernia   . Hypertension   . Lupus   . Mitral stenosis    Echo 4/16:  EF 55-60%, no RWMA, Gr 1 DD, mod MS (mean 9 mmHg), mod LAE, mild RAE, PASP 65, mod to severe TR, trivial eff  . Peptic ulcer disease   . Stroke [facility])    per patient "they said i had a small stroke but i couldnt even tell"    HPI:    History of chronic anemia and is on dialysis. She has had several aspirations of her shoulder for chronic synovitis. She complains of inability to elevate her right shoulder.  PCP: Benito Mccreedy, MD   Discharged Condition: good  Hospital Course:  Patient underwent the above stated procedure on 10/09/2016. Patient tolerated the procedure well and brought to the recovery room in good condition and subsequently to the  floor.  POD #1 BP: 169/87 ; Pulse: 75 ; Temp: 98.3 F (36.8 C) ; Resp: 16 Patient reports pain as 3 on 0-10 scale.Doing well. Dressing is dry. HBG is stable at 8.5. She will have dialysis today.  Neurologically intact.  LABS  Basename    HGB     8.5  HCT     28.5    Discharge Exam: General appearance: alert, cooperative and no distress  Disposition: Home with follow up in 2 weeks   Follow-up Information    Latanya [name], MD. Schedule an appointment as soon as possible for a visit in 2 day(s).   Specialty:  Orthopedic Surgery Contact information: [address] [place] [address] [phone]           Discharge Instructions    Call MD / Call 911    Complete by:  As directed    If you experience chest pain or shortness of breath, CALL 911 and be transported to the hospital emergency room.  If you develope a fever above 101 F, pus (white drainage) or increased drainage or redness at the wound, or calf pain, call your surgeon's office.   Constipation Prevention    Complete by:  As directed    Drink plenty of fluids.  Prune juice may be helpful.  You may use a stool softener, such as Colace (over  the counter) 100 mg twice a day.  Use MiraLax (over the counter) for constipation as needed.   Diet - low sodium heart healthy    Complete by:  As directed       Allergies as of 10/10/2016   Not on File     Medication List    STOP taking these medications   acetaminophen 500 MG tablet Commonly known as:  TYLENOL   metroNIDAZOLE 500 MG tablet Commonly known as:  FLAGYL     TAKE these medications   amLODipine 10 MG tablet Commonly known as:  NORVASC Take 10 mg by mouth at bedtime.   aspirin EC 81 MG tablet Take 81 mg by mouth at bedtime.   CLEAR EYES MAX REDNESS RELIEF 0.03-0.5 % Soln Generic drug:  Naphazoline-Glycerin Place 1-2 drops into both eyes 3 (three) times daily as needed (for dry/irritated eyes).   cloNIDine 0.2 MG  tablet Commonly known as:  CATAPRES Take 1 tablet (0.2 mg total) by mouth 2 (two) times daily.   labetalol 300 MG tablet Commonly known as:  NORMODYNE Take 600 mg by mouth 3 (three) times daily.   lanthanum 1000 MG chewable tablet Commonly known as:  FOSRENOL Chew 2 tablets (2,000 mg total) by mouth 3 (three) times daily with meals. What changed:  how much to take   lisinopril 40 MG tablet Commonly known as:  PRINIVIL,ZESTRIL Take 40 mg by mouth daily at 6 PM.   oxyCODONE-acetaminophen 5-325 MG tablet Commonly known as:  PERCOCET/ROXICET Take 1 tablet by mouth every 4 (four) hours as needed for moderate pain.   pantoprazole 40 MG tablet Commonly known as:  PROTONIX Take 1 tablet (40 mg total) by mouth 2 (two) times daily.   polyethylene glycol packet Commonly known as:  MIRALAX / GLYCOLAX Take 17 g by mouth daily as needed for mild constipation.        Signed: [place] Pugh. Kais Monje   PA-C  10/16/2016, 2:59 PM

## 2016-10-17 DIAGNOSIS — N186 End stage renal disease: Secondary | ICD-10-CM | POA: Diagnosis not present

## 2016-10-17 DIAGNOSIS — D631 Anemia in chronic kidney disease: Secondary | ICD-10-CM | POA: Diagnosis not present

## 2016-10-17 DIAGNOSIS — M3210 Systemic lupus erythematosus, organ or system involvement unspecified: Secondary | ICD-10-CM | POA: Diagnosis not present

## 2016-10-17 DIAGNOSIS — D509 Iron deficiency anemia, unspecified: Secondary | ICD-10-CM | POA: Diagnosis not present

## 2016-10-17 DIAGNOSIS — N2581 Secondary hyperparathyroidism of renal origin: Secondary | ICD-10-CM | POA: Diagnosis not present

## 2016-10-18 DIAGNOSIS — N186 End stage renal disease: Secondary | ICD-10-CM | POA: Diagnosis not present

## 2016-10-18 DIAGNOSIS — I120 Hypertensive chronic kidney disease with stage 5 chronic kidney disease or end stage renal disease: Secondary | ICD-10-CM | POA: Diagnosis not present

## 2016-10-18 DIAGNOSIS — Z992 Dependence on renal dialysis: Secondary | ICD-10-CM | POA: Diagnosis not present

## 2016-10-19 DIAGNOSIS — D631 Anemia in chronic kidney disease: Secondary | ICD-10-CM | POA: Diagnosis not present

## 2016-10-19 DIAGNOSIS — N186 End stage renal disease: Secondary | ICD-10-CM | POA: Diagnosis not present

## 2016-10-19 DIAGNOSIS — D509 Iron deficiency anemia, unspecified: Secondary | ICD-10-CM | POA: Diagnosis not present

## 2016-10-19 DIAGNOSIS — M3210 Systemic lupus erythematosus, organ or system involvement unspecified: Secondary | ICD-10-CM | POA: Diagnosis not present

## 2016-10-19 DIAGNOSIS — N2581 Secondary hyperparathyroidism of renal origin: Secondary | ICD-10-CM | POA: Diagnosis not present

## 2016-10-19 NOTE — Addendum Note (Signed)
Addendum  created 10/19/16 1326 by Effie Berkshire, MD   Sign clinical note

## 2016-10-22 DIAGNOSIS — M3210 Systemic lupus erythematosus, organ or system involvement unspecified: Secondary | ICD-10-CM | POA: Diagnosis not present

## 2016-10-22 DIAGNOSIS — N186 End stage renal disease: Secondary | ICD-10-CM | POA: Diagnosis not present

## 2016-10-22 DIAGNOSIS — D631 Anemia in chronic kidney disease: Secondary | ICD-10-CM | POA: Diagnosis not present

## 2016-10-22 DIAGNOSIS — D509 Iron deficiency anemia, unspecified: Secondary | ICD-10-CM | POA: Diagnosis not present

## 2016-10-22 DIAGNOSIS — N2581 Secondary hyperparathyroidism of renal origin: Secondary | ICD-10-CM | POA: Diagnosis not present

## 2016-10-24 DIAGNOSIS — N186 End stage renal disease: Secondary | ICD-10-CM | POA: Diagnosis not present

## 2016-10-24 DIAGNOSIS — D509 Iron deficiency anemia, unspecified: Secondary | ICD-10-CM | POA: Diagnosis not present

## 2016-10-24 DIAGNOSIS — D631 Anemia in chronic kidney disease: Secondary | ICD-10-CM | POA: Diagnosis not present

## 2016-10-24 DIAGNOSIS — M3210 Systemic lupus erythematosus, organ or system involvement unspecified: Secondary | ICD-10-CM | POA: Diagnosis not present

## 2016-10-24 DIAGNOSIS — N2581 Secondary hyperparathyroidism of renal origin: Secondary | ICD-10-CM | POA: Diagnosis not present

## 2016-10-26 DIAGNOSIS — M75101 Unspecified rotator cuff tear or rupture of right shoulder, not specified as traumatic: Secondary | ICD-10-CM | POA: Diagnosis not present

## 2016-10-26 DIAGNOSIS — D509 Iron deficiency anemia, unspecified: Secondary | ICD-10-CM | POA: Diagnosis not present

## 2016-10-26 DIAGNOSIS — Z4789 Encounter for other orthopedic aftercare: Secondary | ICD-10-CM | POA: Diagnosis not present

## 2016-10-26 DIAGNOSIS — N2581 Secondary hyperparathyroidism of renal origin: Secondary | ICD-10-CM | POA: Diagnosis not present

## 2016-10-26 DIAGNOSIS — M3210 Systemic lupus erythematosus, organ or system involvement unspecified: Secondary | ICD-10-CM | POA: Diagnosis not present

## 2016-10-26 DIAGNOSIS — N186 End stage renal disease: Secondary | ICD-10-CM | POA: Diagnosis not present

## 2016-10-26 DIAGNOSIS — D631 Anemia in chronic kidney disease: Secondary | ICD-10-CM | POA: Diagnosis not present

## 2016-10-29 ENCOUNTER — Encounter (HOSPITAL_COMMUNITY): Payer: Self-pay | Admitting: Emergency Medicine

## 2016-10-29 ENCOUNTER — Emergency Department (HOSPITAL_COMMUNITY)
Admission: EM | Admit: 2016-10-29 | Discharge: 2016-10-29 | Disposition: A | Discharge status: Home / Self Care | Payer: Medicare Other | Attending: Emergency Medicine | Admitting: Emergency Medicine

## 2016-10-29 DIAGNOSIS — F17210 Nicotine dependence, cigarettes, uncomplicated: Secondary | ICD-10-CM | POA: Diagnosis not present

## 2016-10-29 DIAGNOSIS — D631 Anemia in chronic kidney disease: Secondary | ICD-10-CM | POA: Diagnosis not present

## 2016-10-29 DIAGNOSIS — N2581 Secondary hyperparathyroidism of renal origin: Secondary | ICD-10-CM | POA: Diagnosis not present

## 2016-10-29 DIAGNOSIS — Z992 Dependence on renal dialysis: Secondary | ICD-10-CM | POA: Diagnosis not present

## 2016-10-29 DIAGNOSIS — M3210 Systemic lupus erythematosus, organ or system involvement unspecified: Secondary | ICD-10-CM | POA: Diagnosis not present

## 2016-10-29 DIAGNOSIS — Z8673 Personal history of transient ischemic attack (TIA), and cerebral infarction without residual deficits: Secondary | ICD-10-CM | POA: Diagnosis not present

## 2016-10-29 DIAGNOSIS — N186 End stage renal disease: Secondary | ICD-10-CM | POA: Diagnosis not present

## 2016-10-29 DIAGNOSIS — D509 Iron deficiency anemia, unspecified: Secondary | ICD-10-CM | POA: Diagnosis not present

## 2016-10-29 DIAGNOSIS — I5032 Chronic diastolic (congestive) heart failure: Secondary | ICD-10-CM | POA: Insufficient documentation

## 2016-10-29 DIAGNOSIS — R799 Abnormal finding of blood chemistry, unspecified: Secondary | ICD-10-CM | POA: Diagnosis present

## 2016-10-29 DIAGNOSIS — Z7982 Long term (current) use of aspirin: Secondary | ICD-10-CM | POA: Diagnosis not present

## 2016-10-29 DIAGNOSIS — I120 Hypertensive chronic kidney disease with stage 5 chronic kidney disease or end stage renal disease: Secondary | ICD-10-CM | POA: Diagnosis not present

## 2016-10-29 DIAGNOSIS — Z79899 Other long term (current) drug therapy: Secondary | ICD-10-CM | POA: Insufficient documentation

## 2016-10-29 DIAGNOSIS — I132 Hypertensive heart and chronic kidney disease with heart failure and with stage 5 chronic kidney disease, or end stage renal disease: Secondary | ICD-10-CM | POA: Diagnosis not present

## 2016-10-29 DIAGNOSIS — N189 Chronic kidney disease, unspecified: Secondary | ICD-10-CM

## 2016-10-29 LAB — CBC WITH DIFFERENTIAL/PLATELET
Basophils Absolute: 0 10*3/uL (ref 0.0–0.1)
Basophils Relative: 0 %
EOS ABS: 0.1 10*3/uL (ref 0.0–0.7)
EOS PCT: 1 %
HCT: 28.8 % — ABNORMAL LOW (ref 36.0–46.0)
Hemoglobin: 8.5 g/dL — ABNORMAL LOW (ref 12.0–15.0)
Lymphocytes Relative: 12 %
Lymphs Abs: 0.7 10*3/uL (ref 0.7–4.0)
MCH: 27.5 pg (ref 26.0–34.0)
MCHC: 29.5 g/dL — ABNORMAL LOW (ref 30.0–36.0)
MCV: 93.2 fL (ref 78.0–100.0)
MONO ABS: 0.3 10*3/uL (ref 0.1–1.0)
Monocytes Relative: 4 %
Neutro Abs: 5.2 10*3/uL (ref 1.7–7.7)
Neutrophils Relative %: 83 %
PLATELETS: 297 10*3/uL (ref 150–400)
RBC: 3.09 MIL/uL — ABNORMAL LOW (ref 3.87–5.11)
RDW: 18.9 % — AB (ref 11.5–15.5)
WBC: 6.3 10*3/uL (ref 4.0–10.5)

## 2016-10-29 LAB — COMPREHENSIVE METABOLIC PANEL
ALT: 11 U/L — ABNORMAL LOW (ref 14–54)
AST: 22 U/L (ref 15–41)
Albumin: 3 g/dL — ABNORMAL LOW (ref 3.5–5.0)
Alkaline Phosphatase: 102 U/L (ref 38–126)
Anion gap: 11 (ref 5–15)
BUN: 15 mg/dL (ref 6–20)
CHLORIDE: 92 mmol/L — AB (ref 101–111)
CO2: 31 mmol/L (ref 22–32)
CREATININE: 2.87 mg/dL — AB (ref 0.44–1.00)
Calcium: 8.8 mg/dL — ABNORMAL LOW (ref 8.9–10.3)
GFR, EST AFRICAN AMERICAN: 20 mL/min — AB (ref >60)
GFR, EST NON AFRICAN AMERICAN: 18 mL/min — AB (ref >60)
Glucose, Bld: 104 mg/dL — ABNORMAL HIGH (ref 65–99)
POTASSIUM: 3.2 mmol/L — AB (ref 3.5–5.1)
SODIUM: 134 mmol/L — AB (ref 135–145)
Total Bilirubin: 0.8 mg/dL (ref 0.3–1.2)
Total Protein: 7.7 g/dL (ref 6.5–8.1)

## 2016-10-29 NOTE — ED Notes (Signed)
Patient able to ambulate independently.  MD assessment and discharge occurred before RN assessment.

## 2016-10-29 NOTE — Discharge Instructions (Signed)
Follow up with your kidney doctors, return to the ED for fever, vomiting ,chest pain, shortness of breath

## 2016-10-29 NOTE — ED Provider Notes (Signed)
[facility] DEPT Provider Note   CSN: [id number] Arrival date & time: 10/29/16  1432  By signing my name below, I, [name], attest that this documentation has been prepared under the direction and in the presence of Connie Rank, MD. Electronically Signed: Collene [name], Scribe. 10/29/16. 5:44 PM.  History   Chief Complaint Chief Complaint  Patient presents with  . Abnormal Lab   HPI Comments: [name] is a 56 y.o. female with a history of anemia, CHF, CKD on dialysis, and a stroke, who presents to the Emergency Department complaining of gradual-onset, constant fatigue that began several days ago. Patient state shehas had increased weakness and fatigue. Patient is concerned that her hemoglobin may be low due to losing a profuse amount of blood during her shoulder surgery two weeks ago. Patient reports associated lightheadedness. No modifying factors indicated. Patient denies any fever, chills, cough, nausea, vomiting, abdominal pain, or any additional symptoms.   The history is provided by the patient. No language interpreter was used.    Past Medical History:  Diagnosis Date  . Anemia   . Aortic stenosis 09/25/2016   Echo 07/24/16: Mod conc LVH, EF 60-65, no RWMA, Gr 2 DDd, bicuspid aortic valve, mild to mod AS (mean 18, peak 38), MAC, mod mitral stenosis (mean 9, peak 19), mild to mod MR, severe LAE, normal RVSF, mild RAE, mild TR  . Blood transfusion '08   [facility]  . Chronic diastolic CHF (congestive heart failure) ([place])   . Dysfunctional uterine bleeding 12/19/2010  . ESRD (end stage renal disease) ([place])    dialysis   Mon Wed Fri  . GERD (gastroesophageal reflux disease)   . Hemodialysis patient [facility])    right extremity port  . Hx of cardiovascular stress test    Lexiscan Myoview 4/16:  Normal stress nuclear study, EF 59%  . Hx of hiatal hernia   . Hypertension   . Lupus   . Mitral stenosis    Echo 4/16:  EF 55-60%, no RWMA, Gr 1 DD, mod MS (mean 9 mmHg), mod LAE, mild  RAE, PASP 65, mod to severe TR, trivial eff  . Peptic ulcer disease   . Stroke [facility])    per patient "they said i had a small stroke but i couldnt even tell"    Patient Active Problem List   Diagnosis Date Noted  . Rotator cuff tear arthropathy, right 10/09/2016  . Aortic stenosis 09/25/2016  . Leukocytosis   . Colitis 05/01/2015  . History of sepsis 05/01/2015  . Hypoxia 05/01/2015  . Mitral stenosis 08/30/2014  . End-stage renal disease on hemodialysis ([place]) 07/25/2014  . History of pulmonary edema 07/25/2014  . Lupus (systemic lupus erythematosus) ([place]) 07/25/2014  . DUB (dysfunctional uterine bleeding) 07/25/2014  . Anemia 07/25/2014  . Peptic ulcer disease 07/25/2014  . History of stroke 07/25/2014  . Hypertensive heart disease with CHF (congestive heart failure) ([place]) 12/19/2010  . Dialysis care 12/19/2010    Past Surgical History:  Procedure Laterality Date  . DIALYSIS FISTULA CREATION  2007  . ENDOMETRIAL ABLATION    . ESOPHAGOGASTRODUODENOSCOPY N/A 07/31/2014   Procedure: ESOPHAGOGASTRODUODENOSCOPY (EGD);  Surgeon: Clarene Essex, MD;  Location[facility] ENDOSCOPY;  Service: Endoscopy;  Laterality: N/A;  . SHOULDER OPEN ROTATOR CUFF REPAIR Right 10/09/2016   Procedure: ROTATOR CUFF REPAIR SHOULDER OPEN partial acrominectomy and extensive synovectomy;  Surgeon: Latanya [name], MD;  Location: [facility] ORS;  Service: Orthopedics;  Laterality: Right;  RNFA    OB History    No  data available       Home Medications    Prior to Admission medications   Medication Sig Start Date End Date Taking? Authorizing Provider  amLODipine (NORVASC) 10 MG tablet Take 10 mg by mouth at bedtime.     [provider]  aspirin EC 81 MG tablet Take 81 mg by mouth at bedtime.    [provider]  cloNIDine (CATAPRES) 0.2 MG tablet Take 1 tablet (0.2 mg total) by mouth 2 (two) times daily. 09/25/16   Richardson Dopp T, PA-C  labetalol (NORMODYNE) 300 MG tablet Take 600 mg by mouth 3 (three)  times daily.     [provider]  lanthanum (FOSRENOL) 1000 MG chewable tablet Chew 2 tablets (2,000 mg total) by mouth 3 (three) times daily with meals. Patient taking differently: Chew 1,000-2,000 mg by mouth 3 (three) times daily with meals.  05/05/15   Eugenie Filler, MD  lisinopril (PRINIVIL,ZESTRIL) 40 MG tablet Take 40 mg by mouth daily at 6 PM.  08/25/14   [provider]  Naphazoline-Glycerin (CLEAR EYES MAX REDNESS RELIEF) 0.03-0.5 % SOLN Place 1-2 drops into both eyes 3 (three) times daily as needed (for dry/irritated eyes).    [provider]  oxyCODONE-acetaminophen (PERCOCET/ROXICET) 5-325 MG tablet Take 1 tablet by mouth every 4 (four) hours as needed for moderate pain. 10/10/16   Danae Orleans, PA-C  pantoprazole (PROTONIX) 40 MG tablet Take 1 tablet (40 mg total) by mouth 2 (two) times daily. 08/01/14   Domenic Polite, MD  polyethylene glycol [facility] / Floria Raveling) packet Take 17 g by mouth daily as needed for mild constipation. 10/10/16   Danae Orleans, PA-C    Family History Family History  Problem Relation Age of Onset  . Liver cancer Maternal Grandmother   . Lymphoma Maternal Aunt   . Heart attack Neg Hx     Social History Social History  Substance Use Topics  . Smoking status: Current Some Day Smoker    Packs/day: 0.25    Types: Cigarettes  . Smokeless tobacco: Never Used  . Alcohol use 0.0 oz/week     Comment: occasional     Allergies   Patient has no allergy information on record.   Review of Systems Review of Systems  Constitutional: Positive for fatigue. Negative for chills and fever.  Respiratory: Negative for cough and shortness of breath.   Cardiovascular: Negative for chest pain.  Gastrointestinal: Negative for nausea and vomiting.  Neurological: Positive for weakness and light-headedness. Negative for dizziness and numbness.  All other systems reviewed and are negative.    Physical Exam Updated Vital Signs BP  (!) 173/91 (BP Location: Left Arm)   Pulse 80   Temp 98.3 F (36.8 C) (Oral)   Resp 14   Ht 1.676 m (_0 )   Wt 56.7 kg (125 lb)   LMP 12/05/2010 (LMP Unknown)   SpO2 98%   BMI 20.18 kg/m   Physical Exam  Constitutional: No distress.  HENT:  Head: Normocephalic and atraumatic.  Right Ear: External ear normal.  Left Ear: External ear normal.  Eyes: Conjunctivae are normal. Right eye exhibits no discharge. Left eye exhibits no discharge. No scleral icterus.  Neck: Neck supple. No tracheal deviation present.  Cardiovascular: Normal rate, regular rhythm and intact distal pulses.   Pulmonary/Chest: Effort normal and breath sounds normal. No stridor. No respiratory distress. She has no wheezes. She has no rales.  Abdominal: Soft. Bowel sounds are normal. She exhibits no distension. There is no  tenderness. There is no rebound and no guarding.  Musculoskeletal: She exhibits tenderness. She exhibits no edema.  Bruised Varicose vein noted left calf, no erythema or edema otherwise  Neurological: She is alert. She has normal strength. No cranial nerve deficit (no facial droop, extraocular movements intact, no slurred speech) or sensory deficit. She exhibits normal muscle tone. She displays no seizure activity. Coordination normal.  Skin: Skin is warm and dry. No rash noted. She is not diaphoretic.  Psychiatric: She has a normal mood and affect.  Nursing note and vitals reviewed.    ED Treatments / Results  DIAGNOSTIC STUDIES: Oxygen Saturation is 98% on RA, normal by my interpretation.    COORDINATION OF CARE: 5:43 PM Discussed treatment plan with pt at bedside and pt agreed to plan.   Labs (all labs ordered are listed, but only abnormal results are displayed) Labs Reviewed  CBC WITH DIFFERENTIAL/PLATELET - Abnormal; Notable for the following:       Result Value   RBC 3.09 (*)    Hemoglobin 8.5 (*)    HCT 28.8 (*)    MCHC 29.5 (*)    RDW 18.9 (*)    All other components within  normal limits  COMPREHENSIVE METABOLIC PANEL - Abnormal; Notable for the following:    Sodium 134 (*)    Potassium 3.2 (*)    Chloride 92 (*)    Glucose, Bld 104 (*)    Creatinine, Ser 2.87 (*)    Calcium 8.8 (*)    Albumin 3.0 (*)    ALT 11 (*)    GFR calc non Af Amer 18 (*)    GFR calc Af Amer 20 (*)    All other components within normal limits     Procedures Procedures (including critical care time)  Medications Ordered in ED Medications - No data to display   Initial Impression / Assessment and Plan / ED Course  I have reviewed the triage vital signs and the nursing notes.  Pertinent labs & imaging results that were available during my care of the patient were reviewed by me and considered in my medical decision making (see chart for details).   Pt was concerned that she was anemic.  Labs are stable compared to previous values.  No complaints of bleeding, blood in her stool.   She has had some mild nasal congestion, uri sx.  It is possible her fatigue is related to that.  Discussed outpatient follow up.  Return for fever, shortness of breath, chest pain, vomiting, other concerns.  Final Clinical Impressions(s) / ED Diagnoses   Final diagnoses:  Anemia in chronic kidney disease, unspecified CKD stage    New Prescriptions New Prescriptions   No medications on file   I personally performed the services described in this documentation, which was scribed in my presence.  The recorded information has been reviewed and is accurate.    Connie Rank, MD 10/29/16 [phone]

## 2016-10-29 NOTE — ED Triage Notes (Signed)
Pt st;s she thinks her hgb is low st's she had shoulder surg 2 weeks ago and lost a lot of blood.  Pt st's she has been feeling fatigued and tired.  Also c/o slight headache

## 2016-10-31 DIAGNOSIS — N186 End stage renal disease: Secondary | ICD-10-CM | POA: Diagnosis not present

## 2016-10-31 DIAGNOSIS — M3210 Systemic lupus erythematosus, organ or system involvement unspecified: Secondary | ICD-10-CM | POA: Diagnosis not present

## 2016-10-31 DIAGNOSIS — N2581 Secondary hyperparathyroidism of renal origin: Secondary | ICD-10-CM | POA: Diagnosis not present

## 2016-10-31 DIAGNOSIS — D509 Iron deficiency anemia, unspecified: Secondary | ICD-10-CM | POA: Diagnosis not present

## 2016-10-31 DIAGNOSIS — D631 Anemia in chronic kidney disease: Secondary | ICD-10-CM | POA: Diagnosis not present

## 2016-11-02 DIAGNOSIS — M3210 Systemic lupus erythematosus, organ or system involvement unspecified: Secondary | ICD-10-CM | POA: Diagnosis not present

## 2016-11-02 DIAGNOSIS — D509 Iron deficiency anemia, unspecified: Secondary | ICD-10-CM | POA: Diagnosis not present

## 2016-11-02 DIAGNOSIS — D631 Anemia in chronic kidney disease: Secondary | ICD-10-CM | POA: Diagnosis not present

## 2016-11-02 DIAGNOSIS — N186 End stage renal disease: Secondary | ICD-10-CM | POA: Diagnosis not present

## 2016-11-02 DIAGNOSIS — N2581 Secondary hyperparathyroidism of renal origin: Secondary | ICD-10-CM | POA: Diagnosis not present

## 2016-11-05 DIAGNOSIS — N2581 Secondary hyperparathyroidism of renal origin: Secondary | ICD-10-CM | POA: Diagnosis not present

## 2016-11-05 DIAGNOSIS — D631 Anemia in chronic kidney disease: Secondary | ICD-10-CM | POA: Diagnosis not present

## 2016-11-05 DIAGNOSIS — M3210 Systemic lupus erythematosus, organ or system involvement unspecified: Secondary | ICD-10-CM | POA: Diagnosis not present

## 2016-11-05 DIAGNOSIS — D509 Iron deficiency anemia, unspecified: Secondary | ICD-10-CM | POA: Diagnosis not present

## 2016-11-05 DIAGNOSIS — N186 End stage renal disease: Secondary | ICD-10-CM | POA: Diagnosis not present

## 2016-11-07 DIAGNOSIS — D631 Anemia in chronic kidney disease: Secondary | ICD-10-CM | POA: Diagnosis not present

## 2016-11-07 DIAGNOSIS — M3210 Systemic lupus erythematosus, organ or system involvement unspecified: Secondary | ICD-10-CM | POA: Diagnosis not present

## 2016-11-07 DIAGNOSIS — N2581 Secondary hyperparathyroidism of renal origin: Secondary | ICD-10-CM | POA: Diagnosis not present

## 2016-11-07 DIAGNOSIS — N186 End stage renal disease: Secondary | ICD-10-CM | POA: Diagnosis not present

## 2016-11-07 DIAGNOSIS — D509 Iron deficiency anemia, unspecified: Secondary | ICD-10-CM | POA: Diagnosis not present

## 2016-11-09 DIAGNOSIS — M3210 Systemic lupus erythematosus, organ or system involvement unspecified: Secondary | ICD-10-CM | POA: Diagnosis not present

## 2016-11-09 DIAGNOSIS — D509 Iron deficiency anemia, unspecified: Secondary | ICD-10-CM | POA: Diagnosis not present

## 2016-11-09 DIAGNOSIS — D631 Anemia in chronic kidney disease: Secondary | ICD-10-CM | POA: Diagnosis not present

## 2016-11-09 DIAGNOSIS — N2581 Secondary hyperparathyroidism of renal origin: Secondary | ICD-10-CM | POA: Diagnosis not present

## 2016-11-09 DIAGNOSIS — N186 End stage renal disease: Secondary | ICD-10-CM | POA: Diagnosis not present

## 2016-11-12 DIAGNOSIS — D509 Iron deficiency anemia, unspecified: Secondary | ICD-10-CM | POA: Diagnosis not present

## 2016-11-12 DIAGNOSIS — N2581 Secondary hyperparathyroidism of renal origin: Secondary | ICD-10-CM | POA: Diagnosis not present

## 2016-11-12 DIAGNOSIS — M3210 Systemic lupus erythematosus, organ or system involvement unspecified: Secondary | ICD-10-CM | POA: Diagnosis not present

## 2016-11-12 DIAGNOSIS — N186 End stage renal disease: Secondary | ICD-10-CM | POA: Diagnosis not present

## 2016-11-12 DIAGNOSIS — D631 Anemia in chronic kidney disease: Secondary | ICD-10-CM | POA: Diagnosis not present

## 2016-11-13 DIAGNOSIS — Z4789 Encounter for other orthopedic aftercare: Secondary | ICD-10-CM | POA: Diagnosis not present

## 2016-11-13 DIAGNOSIS — M25511 Pain in right shoulder: Secondary | ICD-10-CM | POA: Diagnosis not present

## 2016-11-13 DIAGNOSIS — M75101 Unspecified rotator cuff tear or rupture of right shoulder, not specified as traumatic: Secondary | ICD-10-CM | POA: Diagnosis not present

## 2016-11-14 DIAGNOSIS — D509 Iron deficiency anemia, unspecified: Secondary | ICD-10-CM | POA: Diagnosis not present

## 2016-11-14 DIAGNOSIS — N186 End stage renal disease: Secondary | ICD-10-CM | POA: Diagnosis not present

## 2016-11-14 DIAGNOSIS — M3210 Systemic lupus erythematosus, organ or system involvement unspecified: Secondary | ICD-10-CM | POA: Diagnosis not present

## 2016-11-14 DIAGNOSIS — N2581 Secondary hyperparathyroidism of renal origin: Secondary | ICD-10-CM | POA: Diagnosis not present

## 2016-11-14 DIAGNOSIS — D631 Anemia in chronic kidney disease: Secondary | ICD-10-CM | POA: Diagnosis not present

## 2016-11-16 DIAGNOSIS — M3210 Systemic lupus erythematosus, organ or system involvement unspecified: Secondary | ICD-10-CM | POA: Diagnosis not present

## 2016-11-16 DIAGNOSIS — D509 Iron deficiency anemia, unspecified: Secondary | ICD-10-CM | POA: Diagnosis not present

## 2016-11-16 DIAGNOSIS — D631 Anemia in chronic kidney disease: Secondary | ICD-10-CM | POA: Diagnosis not present

## 2016-11-16 DIAGNOSIS — N186 End stage renal disease: Secondary | ICD-10-CM | POA: Diagnosis not present

## 2016-11-16 DIAGNOSIS — N2581 Secondary hyperparathyroidism of renal origin: Secondary | ICD-10-CM | POA: Diagnosis not present

## 2016-11-17 DIAGNOSIS — I120 Hypertensive chronic kidney disease with stage 5 chronic kidney disease or end stage renal disease: Secondary | ICD-10-CM | POA: Diagnosis not present

## 2016-11-17 DIAGNOSIS — N186 End stage renal disease: Secondary | ICD-10-CM | POA: Diagnosis not present

## 2016-11-17 DIAGNOSIS — Z992 Dependence on renal dialysis: Secondary | ICD-10-CM | POA: Diagnosis not present

## 2016-11-19 DIAGNOSIS — N2581 Secondary hyperparathyroidism of renal origin: Secondary | ICD-10-CM | POA: Diagnosis not present

## 2016-11-19 DIAGNOSIS — D509 Iron deficiency anemia, unspecified: Secondary | ICD-10-CM | POA: Diagnosis not present

## 2016-11-19 DIAGNOSIS — N186 End stage renal disease: Secondary | ICD-10-CM | POA: Diagnosis not present

## 2016-11-19 DIAGNOSIS — M3210 Systemic lupus erythematosus, organ or system involvement unspecified: Secondary | ICD-10-CM | POA: Diagnosis not present

## 2016-11-19 DIAGNOSIS — D631 Anemia in chronic kidney disease: Secondary | ICD-10-CM | POA: Diagnosis not present

## 2016-11-21 DIAGNOSIS — N2581 Secondary hyperparathyroidism of renal origin: Secondary | ICD-10-CM | POA: Diagnosis not present

## 2016-11-21 DIAGNOSIS — M3210 Systemic lupus erythematosus, organ or system involvement unspecified: Secondary | ICD-10-CM | POA: Diagnosis not present

## 2016-11-21 DIAGNOSIS — D509 Iron deficiency anemia, unspecified: Secondary | ICD-10-CM | POA: Diagnosis not present

## 2016-11-21 DIAGNOSIS — N186 End stage renal disease: Secondary | ICD-10-CM | POA: Diagnosis not present

## 2016-11-21 DIAGNOSIS — D631 Anemia in chronic kidney disease: Secondary | ICD-10-CM | POA: Diagnosis not present

## 2016-11-23 DIAGNOSIS — M3210 Systemic lupus erythematosus, organ or system involvement unspecified: Secondary | ICD-10-CM | POA: Diagnosis not present

## 2016-11-23 DIAGNOSIS — N2581 Secondary hyperparathyroidism of renal origin: Secondary | ICD-10-CM | POA: Diagnosis not present

## 2016-11-23 DIAGNOSIS — D631 Anemia in chronic kidney disease: Secondary | ICD-10-CM | POA: Diagnosis not present

## 2016-11-23 DIAGNOSIS — N186 End stage renal disease: Secondary | ICD-10-CM | POA: Diagnosis not present

## 2016-11-23 DIAGNOSIS — D509 Iron deficiency anemia, unspecified: Secondary | ICD-10-CM | POA: Diagnosis not present

## 2016-11-26 DIAGNOSIS — D509 Iron deficiency anemia, unspecified: Secondary | ICD-10-CM | POA: Diagnosis not present

## 2016-11-26 DIAGNOSIS — N2581 Secondary hyperparathyroidism of renal origin: Secondary | ICD-10-CM | POA: Diagnosis not present

## 2016-11-26 DIAGNOSIS — N186 End stage renal disease: Secondary | ICD-10-CM | POA: Diagnosis not present

## 2016-11-26 DIAGNOSIS — D631 Anemia in chronic kidney disease: Secondary | ICD-10-CM | POA: Diagnosis not present

## 2016-11-26 DIAGNOSIS — M3210 Systemic lupus erythematosus, organ or system involvement unspecified: Secondary | ICD-10-CM | POA: Diagnosis not present

## 2016-11-28 DIAGNOSIS — N2581 Secondary hyperparathyroidism of renal origin: Secondary | ICD-10-CM | POA: Diagnosis not present

## 2016-11-28 DIAGNOSIS — N186 End stage renal disease: Secondary | ICD-10-CM | POA: Diagnosis not present

## 2016-11-28 DIAGNOSIS — D631 Anemia in chronic kidney disease: Secondary | ICD-10-CM | POA: Diagnosis not present

## 2016-11-28 DIAGNOSIS — M3210 Systemic lupus erythematosus, organ or system involvement unspecified: Secondary | ICD-10-CM | POA: Diagnosis not present

## 2016-11-28 DIAGNOSIS — D509 Iron deficiency anemia, unspecified: Secondary | ICD-10-CM | POA: Diagnosis not present

## 2016-11-30 DIAGNOSIS — D631 Anemia in chronic kidney disease: Secondary | ICD-10-CM | POA: Diagnosis not present

## 2016-11-30 DIAGNOSIS — M3210 Systemic lupus erythematosus, organ or system involvement unspecified: Secondary | ICD-10-CM | POA: Diagnosis not present

## 2016-11-30 DIAGNOSIS — N186 End stage renal disease: Secondary | ICD-10-CM | POA: Diagnosis not present

## 2016-11-30 DIAGNOSIS — N2581 Secondary hyperparathyroidism of renal origin: Secondary | ICD-10-CM | POA: Diagnosis not present

## 2016-11-30 DIAGNOSIS — D509 Iron deficiency anemia, unspecified: Secondary | ICD-10-CM | POA: Diagnosis not present

## 2016-12-03 DIAGNOSIS — D509 Iron deficiency anemia, unspecified: Secondary | ICD-10-CM | POA: Diagnosis not present

## 2016-12-03 DIAGNOSIS — N186 End stage renal disease: Secondary | ICD-10-CM | POA: Diagnosis not present

## 2016-12-03 DIAGNOSIS — M3210 Systemic lupus erythematosus, organ or system involvement unspecified: Secondary | ICD-10-CM | POA: Diagnosis not present

## 2016-12-03 DIAGNOSIS — N2581 Secondary hyperparathyroidism of renal origin: Secondary | ICD-10-CM | POA: Diagnosis not present

## 2016-12-03 DIAGNOSIS — D631 Anemia in chronic kidney disease: Secondary | ICD-10-CM | POA: Diagnosis not present

## 2016-12-05 DIAGNOSIS — N2581 Secondary hyperparathyroidism of renal origin: Secondary | ICD-10-CM | POA: Diagnosis not present

## 2016-12-05 DIAGNOSIS — D509 Iron deficiency anemia, unspecified: Secondary | ICD-10-CM | POA: Diagnosis not present

## 2016-12-05 DIAGNOSIS — N186 End stage renal disease: Secondary | ICD-10-CM | POA: Diagnosis not present

## 2016-12-05 DIAGNOSIS — M3210 Systemic lupus erythematosus, organ or system involvement unspecified: Secondary | ICD-10-CM | POA: Diagnosis not present

## 2016-12-05 DIAGNOSIS — D631 Anemia in chronic kidney disease: Secondary | ICD-10-CM | POA: Diagnosis not present

## 2016-12-07 DIAGNOSIS — N186 End stage renal disease: Secondary | ICD-10-CM | POA: Diagnosis not present

## 2016-12-07 DIAGNOSIS — M3210 Systemic lupus erythematosus, organ or system involvement unspecified: Secondary | ICD-10-CM | POA: Diagnosis not present

## 2016-12-07 DIAGNOSIS — N2581 Secondary hyperparathyroidism of renal origin: Secondary | ICD-10-CM | POA: Diagnosis not present

## 2016-12-07 DIAGNOSIS — D509 Iron deficiency anemia, unspecified: Secondary | ICD-10-CM | POA: Diagnosis not present

## 2016-12-07 DIAGNOSIS — D631 Anemia in chronic kidney disease: Secondary | ICD-10-CM | POA: Diagnosis not present

## 2016-12-10 DIAGNOSIS — N186 End stage renal disease: Secondary | ICD-10-CM | POA: Diagnosis not present

## 2016-12-10 DIAGNOSIS — N2581 Secondary hyperparathyroidism of renal origin: Secondary | ICD-10-CM | POA: Diagnosis not present

## 2016-12-10 DIAGNOSIS — D509 Iron deficiency anemia, unspecified: Secondary | ICD-10-CM | POA: Diagnosis not present

## 2016-12-10 DIAGNOSIS — D631 Anemia in chronic kidney disease: Secondary | ICD-10-CM | POA: Diagnosis not present

## 2016-12-10 DIAGNOSIS — M3210 Systemic lupus erythematosus, organ or system involvement unspecified: Secondary | ICD-10-CM | POA: Diagnosis not present

## 2016-12-11 ENCOUNTER — Ambulatory Visit: Payer: Medicare Other | Admitting: Podiatry

## 2016-12-12 DIAGNOSIS — N186 End stage renal disease: Secondary | ICD-10-CM | POA: Diagnosis not present

## 2016-12-12 DIAGNOSIS — N2581 Secondary hyperparathyroidism of renal origin: Secondary | ICD-10-CM | POA: Diagnosis not present

## 2016-12-12 DIAGNOSIS — D509 Iron deficiency anemia, unspecified: Secondary | ICD-10-CM | POA: Diagnosis not present

## 2016-12-12 DIAGNOSIS — M3210 Systemic lupus erythematosus, organ or system involvement unspecified: Secondary | ICD-10-CM | POA: Diagnosis not present

## 2016-12-12 DIAGNOSIS — D631 Anemia in chronic kidney disease: Secondary | ICD-10-CM | POA: Diagnosis not present

## 2016-12-14 DIAGNOSIS — N186 End stage renal disease: Secondary | ICD-10-CM | POA: Diagnosis not present

## 2016-12-14 DIAGNOSIS — N2581 Secondary hyperparathyroidism of renal origin: Secondary | ICD-10-CM | POA: Diagnosis not present

## 2016-12-14 DIAGNOSIS — M3210 Systemic lupus erythematosus, organ or system involvement unspecified: Secondary | ICD-10-CM | POA: Diagnosis not present

## 2016-12-14 DIAGNOSIS — D631 Anemia in chronic kidney disease: Secondary | ICD-10-CM | POA: Diagnosis not present

## 2016-12-14 DIAGNOSIS — D509 Iron deficiency anemia, unspecified: Secondary | ICD-10-CM | POA: Diagnosis not present

## 2016-12-17 DIAGNOSIS — N2581 Secondary hyperparathyroidism of renal origin: Secondary | ICD-10-CM | POA: Diagnosis not present

## 2016-12-17 DIAGNOSIS — D509 Iron deficiency anemia, unspecified: Secondary | ICD-10-CM | POA: Diagnosis not present

## 2016-12-17 DIAGNOSIS — M3210 Systemic lupus erythematosus, organ or system involvement unspecified: Secondary | ICD-10-CM | POA: Diagnosis not present

## 2016-12-17 DIAGNOSIS — N186 End stage renal disease: Secondary | ICD-10-CM | POA: Diagnosis not present

## 2016-12-17 DIAGNOSIS — D631 Anemia in chronic kidney disease: Secondary | ICD-10-CM | POA: Diagnosis not present

## 2016-12-18 DIAGNOSIS — Z992 Dependence on renal dialysis: Secondary | ICD-10-CM | POA: Diagnosis not present

## 2016-12-18 DIAGNOSIS — I120 Hypertensive chronic kidney disease with stage 5 chronic kidney disease or end stage renal disease: Secondary | ICD-10-CM | POA: Diagnosis not present

## 2016-12-18 DIAGNOSIS — N186 End stage renal disease: Secondary | ICD-10-CM | POA: Diagnosis not present

## 2016-12-19 DIAGNOSIS — D509 Iron deficiency anemia, unspecified: Secondary | ICD-10-CM | POA: Diagnosis not present

## 2016-12-19 DIAGNOSIS — M3210 Systemic lupus erythematosus, organ or system involvement unspecified: Secondary | ICD-10-CM | POA: Diagnosis not present

## 2016-12-19 DIAGNOSIS — N2581 Secondary hyperparathyroidism of renal origin: Secondary | ICD-10-CM | POA: Diagnosis not present

## 2016-12-19 DIAGNOSIS — N186 End stage renal disease: Secondary | ICD-10-CM | POA: Diagnosis not present

## 2016-12-19 DIAGNOSIS — D631 Anemia in chronic kidney disease: Secondary | ICD-10-CM | POA: Diagnosis not present

## 2016-12-21 DIAGNOSIS — D509 Iron deficiency anemia, unspecified: Secondary | ICD-10-CM | POA: Diagnosis not present

## 2016-12-21 DIAGNOSIS — N186 End stage renal disease: Secondary | ICD-10-CM | POA: Diagnosis not present

## 2016-12-21 DIAGNOSIS — N2581 Secondary hyperparathyroidism of renal origin: Secondary | ICD-10-CM | POA: Diagnosis not present

## 2016-12-21 DIAGNOSIS — M3210 Systemic lupus erythematosus, organ or system involvement unspecified: Secondary | ICD-10-CM | POA: Diagnosis not present

## 2016-12-21 DIAGNOSIS — D631 Anemia in chronic kidney disease: Secondary | ICD-10-CM | POA: Diagnosis not present

## 2016-12-24 DIAGNOSIS — N186 End stage renal disease: Secondary | ICD-10-CM | POA: Diagnosis not present

## 2016-12-24 DIAGNOSIS — D509 Iron deficiency anemia, unspecified: Secondary | ICD-10-CM | POA: Diagnosis not present

## 2016-12-24 DIAGNOSIS — M3210 Systemic lupus erythematosus, organ or system involvement unspecified: Secondary | ICD-10-CM | POA: Diagnosis not present

## 2016-12-24 DIAGNOSIS — D631 Anemia in chronic kidney disease: Secondary | ICD-10-CM | POA: Diagnosis not present

## 2016-12-24 DIAGNOSIS — N2581 Secondary hyperparathyroidism of renal origin: Secondary | ICD-10-CM | POA: Diagnosis not present

## 2016-12-26 DIAGNOSIS — D509 Iron deficiency anemia, unspecified: Secondary | ICD-10-CM | POA: Diagnosis not present

## 2016-12-26 DIAGNOSIS — D631 Anemia in chronic kidney disease: Secondary | ICD-10-CM | POA: Diagnosis not present

## 2016-12-26 DIAGNOSIS — N186 End stage renal disease: Secondary | ICD-10-CM | POA: Diagnosis not present

## 2016-12-26 DIAGNOSIS — N2581 Secondary hyperparathyroidism of renal origin: Secondary | ICD-10-CM | POA: Diagnosis not present

## 2016-12-26 DIAGNOSIS — M3210 Systemic lupus erythematosus, organ or system involvement unspecified: Secondary | ICD-10-CM | POA: Diagnosis not present

## 2016-12-28 DIAGNOSIS — D631 Anemia in chronic kidney disease: Secondary | ICD-10-CM | POA: Diagnosis not present

## 2016-12-28 DIAGNOSIS — N2581 Secondary hyperparathyroidism of renal origin: Secondary | ICD-10-CM | POA: Diagnosis not present

## 2016-12-28 DIAGNOSIS — M3210 Systemic lupus erythematosus, organ or system involvement unspecified: Secondary | ICD-10-CM | POA: Diagnosis not present

## 2016-12-28 DIAGNOSIS — D509 Iron deficiency anemia, unspecified: Secondary | ICD-10-CM | POA: Diagnosis not present

## 2016-12-28 DIAGNOSIS — N186 End stage renal disease: Secondary | ICD-10-CM | POA: Diagnosis not present

## 2016-12-31 DIAGNOSIS — N186 End stage renal disease: Secondary | ICD-10-CM | POA: Diagnosis not present

## 2016-12-31 DIAGNOSIS — N2581 Secondary hyperparathyroidism of renal origin: Secondary | ICD-10-CM | POA: Diagnosis not present

## 2016-12-31 DIAGNOSIS — M3210 Systemic lupus erythematosus, organ or system involvement unspecified: Secondary | ICD-10-CM | POA: Diagnosis not present

## 2016-12-31 DIAGNOSIS — D509 Iron deficiency anemia, unspecified: Secondary | ICD-10-CM | POA: Diagnosis not present

## 2016-12-31 DIAGNOSIS — D631 Anemia in chronic kidney disease: Secondary | ICD-10-CM | POA: Diagnosis not present

## 2017-01-02 DIAGNOSIS — N2581 Secondary hyperparathyroidism of renal origin: Secondary | ICD-10-CM | POA: Diagnosis not present

## 2017-01-02 DIAGNOSIS — N186 End stage renal disease: Secondary | ICD-10-CM | POA: Diagnosis not present

## 2017-01-02 DIAGNOSIS — D631 Anemia in chronic kidney disease: Secondary | ICD-10-CM | POA: Diagnosis not present

## 2017-01-02 DIAGNOSIS — D509 Iron deficiency anemia, unspecified: Secondary | ICD-10-CM | POA: Diagnosis not present

## 2017-01-02 DIAGNOSIS — M3210 Systemic lupus erythematosus, organ or system involvement unspecified: Secondary | ICD-10-CM | POA: Diagnosis not present

## 2017-01-04 DIAGNOSIS — D509 Iron deficiency anemia, unspecified: Secondary | ICD-10-CM | POA: Diagnosis not present

## 2017-01-04 DIAGNOSIS — N186 End stage renal disease: Secondary | ICD-10-CM | POA: Diagnosis not present

## 2017-01-04 DIAGNOSIS — N2581 Secondary hyperparathyroidism of renal origin: Secondary | ICD-10-CM | POA: Diagnosis not present

## 2017-01-04 DIAGNOSIS — M3210 Systemic lupus erythematosus, organ or system involvement unspecified: Secondary | ICD-10-CM | POA: Diagnosis not present

## 2017-01-04 DIAGNOSIS — D631 Anemia in chronic kidney disease: Secondary | ICD-10-CM | POA: Diagnosis not present

## 2017-01-07 DIAGNOSIS — D509 Iron deficiency anemia, unspecified: Secondary | ICD-10-CM | POA: Diagnosis not present

## 2017-01-07 DIAGNOSIS — N2581 Secondary hyperparathyroidism of renal origin: Secondary | ICD-10-CM | POA: Diagnosis not present

## 2017-01-07 DIAGNOSIS — D631 Anemia in chronic kidney disease: Secondary | ICD-10-CM | POA: Diagnosis not present

## 2017-01-07 DIAGNOSIS — M3210 Systemic lupus erythematosus, organ or system involvement unspecified: Secondary | ICD-10-CM | POA: Diagnosis not present

## 2017-01-07 DIAGNOSIS — N186 End stage renal disease: Secondary | ICD-10-CM | POA: Diagnosis not present

## 2017-01-09 DIAGNOSIS — N186 End stage renal disease: Secondary | ICD-10-CM | POA: Diagnosis not present

## 2017-01-09 DIAGNOSIS — N2581 Secondary hyperparathyroidism of renal origin: Secondary | ICD-10-CM | POA: Diagnosis not present

## 2017-01-09 DIAGNOSIS — D631 Anemia in chronic kidney disease: Secondary | ICD-10-CM | POA: Diagnosis not present

## 2017-01-09 DIAGNOSIS — D509 Iron deficiency anemia, unspecified: Secondary | ICD-10-CM | POA: Diagnosis not present

## 2017-01-09 DIAGNOSIS — M3210 Systemic lupus erythematosus, organ or system involvement unspecified: Secondary | ICD-10-CM | POA: Diagnosis not present

## 2017-01-11 DIAGNOSIS — N2581 Secondary hyperparathyroidism of renal origin: Secondary | ICD-10-CM | POA: Diagnosis not present

## 2017-01-11 DIAGNOSIS — N186 End stage renal disease: Secondary | ICD-10-CM | POA: Diagnosis not present

## 2017-01-11 DIAGNOSIS — M3210 Systemic lupus erythematosus, organ or system involvement unspecified: Secondary | ICD-10-CM | POA: Diagnosis not present

## 2017-01-11 DIAGNOSIS — D631 Anemia in chronic kidney disease: Secondary | ICD-10-CM | POA: Diagnosis not present

## 2017-01-11 DIAGNOSIS — D509 Iron deficiency anemia, unspecified: Secondary | ICD-10-CM | POA: Diagnosis not present

## 2017-01-14 DIAGNOSIS — D631 Anemia in chronic kidney disease: Secondary | ICD-10-CM | POA: Diagnosis not present

## 2017-01-14 DIAGNOSIS — D509 Iron deficiency anemia, unspecified: Secondary | ICD-10-CM | POA: Diagnosis not present

## 2017-01-14 DIAGNOSIS — N2581 Secondary hyperparathyroidism of renal origin: Secondary | ICD-10-CM | POA: Diagnosis not present

## 2017-01-14 DIAGNOSIS — M3210 Systemic lupus erythematosus, organ or system involvement unspecified: Secondary | ICD-10-CM | POA: Diagnosis not present

## 2017-01-14 DIAGNOSIS — N186 End stage renal disease: Secondary | ICD-10-CM | POA: Diagnosis not present

## 2017-01-16 DIAGNOSIS — N186 End stage renal disease: Secondary | ICD-10-CM | POA: Diagnosis not present

## 2017-01-16 DIAGNOSIS — N2581 Secondary hyperparathyroidism of renal origin: Secondary | ICD-10-CM | POA: Diagnosis not present

## 2017-01-16 DIAGNOSIS — M3210 Systemic lupus erythematosus, organ or system involvement unspecified: Secondary | ICD-10-CM | POA: Diagnosis not present

## 2017-01-16 DIAGNOSIS — D509 Iron deficiency anemia, unspecified: Secondary | ICD-10-CM | POA: Diagnosis not present

## 2017-01-16 DIAGNOSIS — D631 Anemia in chronic kidney disease: Secondary | ICD-10-CM | POA: Diagnosis not present

## 2017-01-18 DIAGNOSIS — N2581 Secondary hyperparathyroidism of renal origin: Secondary | ICD-10-CM | POA: Diagnosis not present

## 2017-01-18 DIAGNOSIS — Z992 Dependence on renal dialysis: Secondary | ICD-10-CM | POA: Diagnosis not present

## 2017-01-18 DIAGNOSIS — N186 End stage renal disease: Secondary | ICD-10-CM | POA: Diagnosis not present

## 2017-01-18 DIAGNOSIS — M3210 Systemic lupus erythematosus, organ or system involvement unspecified: Secondary | ICD-10-CM | POA: Diagnosis not present

## 2017-01-18 DIAGNOSIS — I120 Hypertensive chronic kidney disease with stage 5 chronic kidney disease or end stage renal disease: Secondary | ICD-10-CM | POA: Diagnosis not present

## 2017-01-18 DIAGNOSIS — D631 Anemia in chronic kidney disease: Secondary | ICD-10-CM | POA: Diagnosis not present

## 2017-01-18 DIAGNOSIS — D509 Iron deficiency anemia, unspecified: Secondary | ICD-10-CM | POA: Diagnosis not present

## 2017-01-21 DIAGNOSIS — D509 Iron deficiency anemia, unspecified: Secondary | ICD-10-CM | POA: Diagnosis not present

## 2017-01-21 DIAGNOSIS — M3210 Systemic lupus erythematosus, organ or system involvement unspecified: Secondary | ICD-10-CM | POA: Diagnosis not present

## 2017-01-21 DIAGNOSIS — N2581 Secondary hyperparathyroidism of renal origin: Secondary | ICD-10-CM | POA: Diagnosis not present

## 2017-01-21 DIAGNOSIS — N186 End stage renal disease: Secondary | ICD-10-CM | POA: Diagnosis not present

## 2017-01-21 DIAGNOSIS — D631 Anemia in chronic kidney disease: Secondary | ICD-10-CM | POA: Diagnosis not present

## 2017-01-23 DIAGNOSIS — M3210 Systemic lupus erythematosus, organ or system involvement unspecified: Secondary | ICD-10-CM | POA: Diagnosis not present

## 2017-01-23 DIAGNOSIS — N186 End stage renal disease: Secondary | ICD-10-CM | POA: Diagnosis not present

## 2017-01-23 DIAGNOSIS — D631 Anemia in chronic kidney disease: Secondary | ICD-10-CM | POA: Diagnosis not present

## 2017-01-23 DIAGNOSIS — D509 Iron deficiency anemia, unspecified: Secondary | ICD-10-CM | POA: Diagnosis not present

## 2017-01-23 DIAGNOSIS — N2581 Secondary hyperparathyroidism of renal origin: Secondary | ICD-10-CM | POA: Diagnosis not present

## 2017-01-25 DIAGNOSIS — N2581 Secondary hyperparathyroidism of renal origin: Secondary | ICD-10-CM | POA: Diagnosis not present

## 2017-01-25 DIAGNOSIS — D509 Iron deficiency anemia, unspecified: Secondary | ICD-10-CM | POA: Diagnosis not present

## 2017-01-25 DIAGNOSIS — D631 Anemia in chronic kidney disease: Secondary | ICD-10-CM | POA: Diagnosis not present

## 2017-01-25 DIAGNOSIS — M3210 Systemic lupus erythematosus, organ or system involvement unspecified: Secondary | ICD-10-CM | POA: Diagnosis not present

## 2017-01-25 DIAGNOSIS — N186 End stage renal disease: Secondary | ICD-10-CM | POA: Diagnosis not present

## 2017-01-26 ENCOUNTER — Encounter (HOSPITAL_COMMUNITY): Payer: Self-pay | Admitting: *Deleted

## 2017-01-26 ENCOUNTER — Emergency Department (HOSPITAL_COMMUNITY): Payer: Medicare Other

## 2017-01-26 ENCOUNTER — Inpatient Hospital Stay (HOSPITAL_COMMUNITY)
Admission: EM | Admit: 2017-01-26 | Discharge: 2017-01-30 | DRG: 640 | Disposition: A | Discharge status: Home / Self Care | Payer: Medicare Other | Attending: Internal Medicine | Admitting: Internal Medicine

## 2017-01-26 DIAGNOSIS — I080 Rheumatic disorders of both mitral and aortic valves: Secondary | ICD-10-CM | POA: Diagnosis present

## 2017-01-26 DIAGNOSIS — I132 Hypertensive heart and chronic kidney disease with heart failure and with stage 5 chronic kidney disease, or end stage renal disease: Secondary | ICD-10-CM | POA: Diagnosis present

## 2017-01-26 DIAGNOSIS — E8770 Fluid overload, unspecified: Secondary | ICD-10-CM | POA: Diagnosis present

## 2017-01-26 DIAGNOSIS — J988 Other specified respiratory disorders: Secondary | ICD-10-CM | POA: Diagnosis not present

## 2017-01-26 DIAGNOSIS — Z8673 Personal history of transient ischemic attack (TIA), and cerebral infarction without residual deficits: Secondary | ICD-10-CM

## 2017-01-26 DIAGNOSIS — F17210 Nicotine dependence, cigarettes, uncomplicated: Secondary | ICD-10-CM | POA: Diagnosis present

## 2017-01-26 DIAGNOSIS — R160 Hepatomegaly, not elsewhere classified: Secondary | ICD-10-CM | POA: Diagnosis present

## 2017-01-26 DIAGNOSIS — M329 Systemic lupus erythematosus, unspecified: Secondary | ICD-10-CM | POA: Diagnosis present

## 2017-01-26 DIAGNOSIS — Z992 Dependence on renal dialysis: Secondary | ICD-10-CM | POA: Diagnosis not present

## 2017-01-26 DIAGNOSIS — Z800 Family history of malignant neoplasm of digestive organs: Secondary | ICD-10-CM | POA: Diagnosis not present

## 2017-01-26 DIAGNOSIS — R05 Cough: Secondary | ICD-10-CM | POA: Diagnosis not present

## 2017-01-26 DIAGNOSIS — I160 Hypertensive urgency: Secondary | ICD-10-CM | POA: Diagnosis not present

## 2017-01-26 DIAGNOSIS — Z8711 Personal history of peptic ulcer disease: Secondary | ICD-10-CM | POA: Diagnosis not present

## 2017-01-26 DIAGNOSIS — D649 Anemia, unspecified: Secondary | ICD-10-CM | POA: Diagnosis present

## 2017-01-26 DIAGNOSIS — I10 Essential (primary) hypertension: Secondary | ICD-10-CM

## 2017-01-26 DIAGNOSIS — M898X9 Other specified disorders of bone, unspecified site: Secondary | ICD-10-CM | POA: Diagnosis present

## 2017-01-26 DIAGNOSIS — K219 Gastro-esophageal reflux disease without esophagitis: Secondary | ICD-10-CM | POA: Diagnosis present

## 2017-01-26 DIAGNOSIS — N186 End stage renal disease: Secondary | ICD-10-CM | POA: Diagnosis not present

## 2017-01-26 DIAGNOSIS — Z807 Family history of other malignant neoplasms of lymphoid, hematopoietic and related tissues: Secondary | ICD-10-CM

## 2017-01-26 DIAGNOSIS — D631 Anemia in chronic kidney disease: Secondary | ICD-10-CM | POA: Diagnosis not present

## 2017-01-26 DIAGNOSIS — I350 Nonrheumatic aortic (valve) stenosis: Secondary | ICD-10-CM | POA: Diagnosis not present

## 2017-01-26 DIAGNOSIS — J208 Acute bronchitis due to other specified organisms: Secondary | ICD-10-CM | POA: Diagnosis present

## 2017-01-26 DIAGNOSIS — Z9114 Patient's other noncompliance with medication regimen: Secondary | ICD-10-CM

## 2017-01-26 DIAGNOSIS — I161 Hypertensive emergency: Secondary | ICD-10-CM | POA: Diagnosis present

## 2017-01-26 DIAGNOSIS — N2581 Secondary hyperparathyroidism of renal origin: Secondary | ICD-10-CM | POA: Diagnosis present

## 2017-01-26 DIAGNOSIS — E876 Hypokalemia: Secondary | ICD-10-CM | POA: Diagnosis present

## 2017-01-26 DIAGNOSIS — I169 Hypertensive crisis, unspecified: Secondary | ICD-10-CM | POA: Diagnosis present

## 2017-01-26 DIAGNOSIS — Z7982 Long term (current) use of aspirin: Secondary | ICD-10-CM

## 2017-01-26 DIAGNOSIS — B9789 Other viral agents as the cause of diseases classified elsewhere: Secondary | ICD-10-CM | POA: Diagnosis not present

## 2017-01-26 DIAGNOSIS — I5032 Chronic diastolic (congestive) heart failure: Secondary | ICD-10-CM | POA: Diagnosis present

## 2017-01-26 LAB — CBC
HEMATOCRIT: 36.8 % (ref 36.0–46.0)
Hemoglobin: 11.1 g/dL — ABNORMAL LOW (ref 12.0–15.0)
MCH: 27.4 pg (ref 26.0–34.0)
MCHC: 30.2 g/dL (ref 30.0–36.0)
MCV: 90.9 fL (ref 78.0–100.0)
Platelets: 233 10*3/uL (ref 150–400)
RBC: 4.05 MIL/uL (ref 3.87–5.11)
RDW: 18.3 % — ABNORMAL HIGH (ref 11.5–15.5)
WBC: 7.7 10*3/uL (ref 4.0–10.5)

## 2017-01-26 LAB — BRAIN NATRIURETIC PEPTIDE: B Natriuretic Peptide: >4500 pg/mL — ABNORMAL HIGH (ref 0.0–100.0)

## 2017-01-26 LAB — BASIC METABOLIC PANEL
Anion gap: 16 — ABNORMAL HIGH (ref 5–15)
BUN: 29 mg/dL — ABNORMAL HIGH (ref 6–20)
CHLORIDE: 96 mmol/L — AB (ref 101–111)
CO2: 25 mmol/L (ref 22–32)
Calcium: 9.6 mg/dL (ref 8.9–10.3)
Creatinine, Ser: 5.55 mg/dL — ABNORMAL HIGH (ref 0.44–1.00)
GFR calc Af Amer: 9 mL/min — ABNORMAL LOW (ref >60)
GFR, EST NON AFRICAN AMERICAN: 8 mL/min — AB (ref >60)
GLUCOSE: 96 mg/dL (ref 65–99)
POTASSIUM: 3.1 mmol/L — AB (ref 3.5–5.1)
Sodium: 137 mmol/L (ref 135–145)

## 2017-01-26 MED ORDER — NITROGLYCERIN IN D5W 200-5 MCG/ML-% IV SOLN
0.0000 ug/min | Freq: Once | INTRAVENOUS | Status: DC
Start: 2017-01-26 — End: 2017-01-27
  Filled 2017-01-26: qty 250

## 2017-01-26 MED ORDER — ACETAMINOPHEN 500 MG PO TABS
1000.0000 mg | ORAL_TABLET | Freq: Once | ORAL | Status: AC
  Administered 2017-01-26: 1000 mg via ORAL
  Filled 2017-01-26: qty 2

## 2017-01-26 MED ORDER — HYDRALAZINE HCL 20 MG/ML IJ SOLN
10.0000 mg | INTRAMUSCULAR | Status: AC
  Administered 2017-01-26: 10 mg via INTRAVENOUS
  Filled 2017-01-26: qty 1

## 2017-01-26 MED ORDER — HYDROCODONE-HOMATROPINE 5-1.5 MG/5ML PO SYRP
5.0000 mL | ORAL_SOLUTION | Freq: Once | ORAL | Status: AC
  Administered 2017-01-26: 5 mL via ORAL
  Filled 2017-01-26: qty 5

## 2017-01-26 NOTE — H&P (Addendum)
PCP:   Benito Mccreedy, MD  Nephrologist: Dr. Warrick Parisian  Chief Complaint:  Cough   HPI: This is a 56 year old female with end-stage renal disease and hemodialysis 11 years. He has been compliant with dialysis. She states approximately week ago she developed a nonproductive cough that has persisted and worsened. She reports a wheeze and a headache that has persisted. She reports nausea and vomiting 1 today. She has shortness of breath but this is chronic, she states this is not significantly changed but she stopped smoking a week ago. She denies any fever, chills, burning urination. She does urinate small amounts. In the ER systolic blood pressures greater than 200, she denies any neurological deficits. She states that baseline her blood pressure is in the 170s to 180s. She states she took her medications today but that is what she vomited up. She finally came to ER.  Review of Systems:  The patient denies anorexia, fever, weight loss,, vision loss, decreased hearing, hoarseness, chest pain, syncope, dyspnea on exertion, peripheral edema, balance deficits, hemoptysis, abdominal pain, melena, hematochezia, severe indigestion/heartburn, headache, SOB, wheeze, hematuria, incontinence, genital sores, muscle weakness, suspicious skin lesions, transient blindness, difficulty walking, depression, unusual weight change, abnormal bleeding, enlarged lymph nodes, angioedema, and breast masses.  Past Medical History: Past Medical History:  Diagnosis Date  . Anemia   . Aortic stenosis 09/25/2016   Echo 07/24/16: Mod conc LVH, EF 60-65, no RWMA, Gr 2 DDd, bicuspid aortic valve, mild to mod AS (mean 18, peak 38), MAC, mod mitral stenosis (mean 9, peak 19), mild to mod MR, severe LAE, normal RVSF, mild RAE, mild TR  . Blood transfusion '08   [facility]  . Chronic diastolic CHF (congestive heart failure) ([place])   . Dysfunctional uterine bleeding 12/19/2010  . ESRD (end stage renal disease) ([place])    dialysis   Mon Wed  Fri  . GERD (gastroesophageal reflux disease)   . Hemodialysis patient [facility])    right extremity port  . Hx of cardiovascular stress test    Lexiscan Myoview 4/16:  Normal stress nuclear study, EF 59%  . Hx of hiatal hernia   . Hypertension   . Lupus   . Mitral stenosis    Echo 4/16:  EF 55-60%, no RWMA, Gr 1 DD, mod MS (mean 9 mmHg), mod LAE, mild RAE, PASP 65, mod to severe TR, trivial eff  . Peptic ulcer disease   . Stroke [facility])    per patient "they said i had a small stroke but i couldnt even tell"   Past Surgical History:  Procedure Laterality Date  . DIALYSIS FISTULA CREATION  2007  . ENDOMETRIAL ABLATION    . ESOPHAGOGASTRODUODENOSCOPY N/A 07/31/2014   Procedure: ESOPHAGOGASTRODUODENOSCOPY (EGD);  Surgeon: Clarene Essex, MD;  Location[facility] ENDOSCOPY;  Service: Endoscopy;  Laterality: N/A;  . SHOULDER OPEN ROTATOR CUFF REPAIR Right 10/09/2016   Procedure: ROTATOR CUFF REPAIR SHOULDER OPEN partial acrominectomy and extensive synovectomy;  Surgeon: Latanya [name], MD;  Location: [facility] ORS;  Service: Orthopedics;  Laterality: Right;  RNFA    Medications: Prior to Admission medications   Medication Sig Start Date End Date Taking? Authorizing Provider  amLODipine (NORVASC) 10 MG tablet Take 10 mg by mouth at bedtime.    Yes [provider]  aspirin EC 81 MG tablet Take 81 mg by mouth at bedtime.   Yes [provider]  B Complex-C-Folic Acid (RENA-VITE RX) 1 MG TABS Take 1 mg by mouth daily. 11/01/16  Yes [provider]  cloNIDine (  CATAPRES) 0.2 MG tablet Take 1 tablet (0.2 mg total) by mouth 2 (two) times daily. 09/25/16  Yes Weaver, Scott T, PA-C  labetalol (NORMODYNE) 300 MG tablet Take 600 mg by mouth 3 (three) times daily.    Yes [provider]  lanthanum (FOSRENOL) 1000 MG chewable tablet Chew 2 tablets (2,000 mg total) by mouth 3 (three) times daily with meals. Patient taking differently: Chew 1,000 mg by mouth 3 (three) times daily with meals.   05/05/15  Yes Eugenie Filler, MD  lisinopril (PRINIVIL,ZESTRIL) 40 MG tablet Take 40 mg by mouth daily at 6 PM.  08/25/14  Yes [provider]  pantoprazole (PROTONIX) 40 MG tablet Take 1 tablet (40 mg total) by mouth 2 (two) times daily. 08/01/14  Yes Domenic Polite, MD  oxyCODONE-acetaminophen (PERCOCET/ROXICET) 5-325 MG tablet Take 1 tablet by mouth every 4 (four) hours as needed for moderate pain. Patient not taking: Reported on 01/26/2017 10/10/16   Danae Orleans, PA-C  polyethylene glycol (MIRALAX / Floria Raveling) packet Take 17 g by mouth daily as needed for mild constipation. Patient not taking: Reported on 01/26/2017 10/10/16   Danae Orleans, PA-C    Allergies:  No Known Allergies  Social History:  reports that she has been smoking Cigarettes.  She has been smoking about 0.25 packs per day. She has never used smokeless tobacco. She reports that she drinks alcohol. She reports that she uses drugs, including Marijuana.  Family History: Family History  Problem Relation Age of Onset  . Liver cancer Maternal Grandmother   . Lymphoma Maternal Aunt   . Heart attack Neg Hx     Physical Exam: Vitals:   01/26/17 2215 01/26/17 2235 01/26/17 2300 01/26/17 2330  BP: (!) 214/108 (!) 197/96 (!) 198/103 (!) 213/111  Pulse: 84 83 84 85  Resp: (!) 30 (!) 27 (!) 22 (!) 23  Temp:      TempSrc:      SpO2: 96% 97% 97% 98%  Weight:      Height:        General:  Alert and oriented times three, well developed and nourished, no acute distress Eyes: PERRLA, pink conjunctiva, no scleral icterus ENT: Moist oral mucosa, neck supple, no thyromegaly Lungs: clear to ascultation, no wheeze, mild crackles, no use of accessory muscles Cardiovascular: regular rate and rhythm, no regurgitation, no gallops, no murmurs. No carotid bruits, no JVD Abdomen: soft, positive BS, non-tender, non-distended, no organomegaly, not an acute abdomen GU: not examined Neuro: CN II - XII grossly intact, sensation  intact Musculoskeletal: strength 5/5 all extremities, no clubbing, cyanosis or edema Skin: no rash, no subcutaneous crepitation, no decubitus Psych: appropriate patient   Labs on Admission:   Recent Labs  01/26/17 1659  NA 137  K 3.1*  CL 96*  CO2 25  GLUCOSE 96  BUN 29*  CREATININE 5.55*  CALCIUM 9.6   No results for input(s): AST, ALT, ALKPHOS, BILITOT, PROT, ALBUMIN in the last 72 hours. No results for input(s): LIPASE, AMYLASE in the last 72 hours.  Recent Labs  01/26/17 1659  WBC 7.7  HGB 11.1*  HCT 36.8  MCV 90.9  PLT 233   No results for input(s): CKTOTAL, CKMB, CKMBINDEX, TROPONINI in the last 72 hours. Invalid input(s): POCBNP No results for input(s): DDIMER in the last 72 hours. No results for input(s): HGBA1C in the last 72 hours. No results for input(s): CHOL, HDL, LDLCALC, TRIG, CHOLHDL, LDLDIRECT in the last 72 hours. No results for input(s): TSH, T4TOTAL,  T3FREE, THYROIDAB in the last 72 hours.  Invalid input(s): FREET3 No results for input(s): VITAMINB12, FOLATE, FERRITIN, TIBC, IRON, RETICCTPCT in the last 72 hours.  Micro Results: No results found for this or any previous visit (from the past 240 hour(s)).   Radiological Exams on Admission: Dg Chest 2 View  Result Date: 01/26/2017 CLINICAL DATA:  Cough for 7 days EXAM: CHEST  2 VIEW COMPARISON:  10/04/2016 FINDINGS: Moderate cardiomegaly. No pleural effusion. Streaky lower lobe opacities. Aortic atherosclerosis. No pneumothorax. IMPRESSION: 1. Cardiomegaly without edema 2. Streaky bibasilar opacities may reflect atelectasis or mild infiltrates Electronically Signed   By: Donavan Foil M.D.   On: 01/26/2017 17:50    Assessment/Plan Present on Admission: . Hypertensive emergency -Admit to stepdown -Labetalol drip -Resume home medications. Clonidine increased 0.71m three times a day -Patient symptomatic with headache  -Fluid overload End-stage renal disease -Nephrology consulted, plans to  dialysis underway  Cough -Unclear etiology, could be related to fluid overload versus mild infiltrates noted on chest x-ray. No clinical evidence of infection, the patient's BNP is > 4500. BNP higher than baseline. -Hycodan PRN  Aortic stenosis -aware, monitor  Hypokalemia -Status post recent dialysis. Monitor  Tobacco abuse -Nicotine patch, duonebs when necessary  History of CVA -Back to baseline. No residual weaknesses reported  GERD -Stable, home meds resumed  . Lupus ([place]ic [place]mat[place] -Stable, home medications resumed  [name] 01/27/2017, 12:00 AM

## 2017-01-26 NOTE — ED Notes (Signed)
Admitting MD at bedside.

## 2017-01-26 NOTE — ED Provider Notes (Signed)
[place] DEPT Provider Note   CSN: [id number] Arrival date & time: 01/26/17  1603     History   Chief Complaint Chief Complaint  Patient presents with  . Cough    HPI [name] is a 56 y.o. female.  HPI  Patient presents with concern of dyspnea. Patient acknowledges multiple medical issues including end-stage renal disease, is on dialysis. Patient had dialysis yesterday, a complete session. Illness began about one week ago.  Since that time patient has a persistent dyspnea, worse with supine positioning, better with upright positioning, worse with activity, better with rest. No focal chest pain. There is mild associated lightheadedness. During illness patient has completed 3 complete dialysis sessions. She is unsure of any changes in her dialysis regimen. No fever, no chills. There is associated cough, worse with supine positioning.    Past Medical History:  Diagnosis Date  . Anemia   . Aortic stenosis 09/25/2016   Echo 07/24/16: Mod conc LVH, EF 60-65, no RWMA, Gr 2 DDd, bicuspid aortic valve, mild to mod AS (mean 18, peak 38), MAC, mod mitral stenosis (mean 9, peak 19), mild to mod MR, severe LAE, normal RVSF, mild RAE, mild TR  . Blood transfusion '08   [facility]  . Chronic diastolic CHF (congestive heart failure) ([place])   . Dysfunctional uterine bleeding 12/19/2010  . ESRD (end stage renal disease) ([place])    dialysis   Mon Wed Fri  . GERD (gastroesophageal reflux disease)   . Hemodialysis patient [facility])    right extremity port  . Hx of cardiovascular stress test    Lexiscan Myoview 4/16:  Normal stress nuclear study, EF 59%  . Hx of hiatal hernia   . Hypertension   . Lupus   . Mitral stenosis    Echo 4/16:  EF 55-60%, no RWMA, Gr 1 DD, mod MS (mean 9 mmHg), mod LAE, mild RAE, PASP 65, mod to severe TR, trivial eff  . Peptic ulcer disease   . Stroke [facility])    per patient "they said i had a small stroke but i couldnt even tell"    Patient Active Problem List   Diagnosis Date Noted  . Rotator cuff tear arthropathy, right 10/09/2016  . Aortic stenosis 09/25/2016  . Leukocytosis   . Colitis 05/01/2015  . History of sepsis 05/01/2015  . Hypoxia 05/01/2015  . Mitral stenosis 08/30/2014  . End-stage renal disease on hemodialysis ([place]) 07/25/2014  . History of pulmonary edema 07/25/2014  . Lupus (systemic lupus erythematosus) ([place]) 07/25/2014  . DUB (dysfunctional uterine bleeding) 07/25/2014  . Anemia 07/25/2014  . Peptic ulcer disease 07/25/2014  . History of stroke 07/25/2014  . Hypertensive heart disease with CHF (congestive heart failure) ([place]) 12/19/2010  . Dialysis care 12/19/2010    Past Surgical History:  Procedure Laterality Date  . DIALYSIS FISTULA CREATION  2007  . ENDOMETRIAL ABLATION    . ESOPHAGOGASTRODUODENOSCOPY N/A 07/31/2014   Procedure: ESOPHAGOGASTRODUODENOSCOPY (EGD);  Surgeon: Clarene Essex, MD;  Location: [facility] ENDOSCOPY;  Service: Endoscopy;  Laterality: N/A;  . SHOULDER OPEN ROTATOR CUFF REPAIR Right 10/09/2016   Procedure: ROTATOR CUFF REPAIR SHOULDER OPEN partial acrominectomy and extensive synovectomy;  Surgeon: Latanya [name], MD;  Location: [facility] ORS;  Service: Orthopedics;  Laterality: Right;  RNFA    OB History    No data available       Home Medications    Prior to Admission medications   Medication Sig Start Date End Date Taking? Authorizing Provider  amLODipine (NORVASC) 10 MG  tablet Take 10 mg by mouth at bedtime.    Yes [provider]  aspirin EC 81 MG tablet Take 81 mg by mouth at bedtime.   Yes [provider]  B Complex-C-Folic Acid (RENA-VITE RX) 1 MG TABS Take 1 mg by mouth daily. 11/01/16  Yes [provider]  cloNIDine (CATAPRES) 0.2 MG tablet Take 1 tablet (0.2 mg total) by mouth 2 (two) times daily. 09/25/16  Yes Weaver, Scott T, PA-C  labetalol (NORMODYNE) 300 MG tablet Take 600 mg by mouth 3 (three) times daily.    Yes [provider]  lanthanum (FOSRENOL) 1000 MG  chewable tablet Chew 2 tablets (2,000 mg total) by mouth 3 (three) times daily with meals. Patient taking differently: Chew 1,000 mg by mouth 3 (three) times daily with meals.  05/05/15  Yes Eugenie Filler, MD  lisinopril (PRINIVIL,ZESTRIL) 40 MG tablet Take 40 mg by mouth daily at 6 PM.  08/25/14  Yes [provider]  pantoprazole (PROTONIX) 40 MG tablet Take 1 tablet (40 mg total) by mouth 2 (two) times daily. 08/01/14  Yes Domenic Polite, MD  oxyCODONE-acetaminophen (PERCOCET/ROXICET) 5-325 MG tablet Take 1 tablet by mouth every 4 (four) hours as needed for moderate pain. Patient not taking: Reported on 01/26/2017 10/10/16   Danae Orleans, PA-C  polyethylene glycol (MIRALAX / Floria Raveling) packet Take 17 g by mouth daily as needed for mild constipation. Patient not taking: Reported on 01/26/2017 10/10/16   Danae Orleans, PA-C    Family History Family History  Problem Relation Age of Onset  . Liver cancer Maternal Grandmother   . Lymphoma Maternal Aunt   . Heart attack Neg Hx     Social History Social History  Substance Use Topics  . Smoking status: Current Some Day Smoker    Packs/day: 0.25    Types: Cigarettes  . Smokeless tobacco: Never Used  . Alcohol use 0.0 oz/week     Comment: occasional     Allergies   Patient has no known allergies.   Review of Systems Review of Systems  Constitutional:       Per HPI, otherwise negative  HENT:       Per HPI, otherwise negative  Respiratory:       Per HPI, otherwise negative  Cardiovascular:       Per HPI, otherwise negative  Gastrointestinal: Negative for vomiting.  Endocrine:       Negative aside from HPI  Genitourinary:       Neg aside from HPI   Musculoskeletal:       Per HPI, otherwise negative  Skin: Negative.   Allergic/Immunologic: Positive for immunocompromised state.  Neurological: Positive for weakness. Negative for syncope.     Physical Exam Updated Vital Signs BP (!) 198/103   Pulse 84   Temp  98.4 F (36.9 C) (Oral)   Resp (!) 22   Ht _0  (1.676 m)   Wt 59 kg (130 lb)   LMP 12/05/2010 (LMP Unknown)   SpO2 97%   BMI 20.98 kg/m   Physical Exam  Constitutional: She is oriented to person, place, and time. She appears well-developed and well-nourished. No distress.  HENT:  Head: Normocephalic and atraumatic.  Eyes: Conjunctivae and EOM are normal.  Cardiovascular: Normal rate and regular rhythm.   Pulmonary/Chest: No stridor. Tachypnea noted. She is in respiratory distress. She has decreased breath sounds.  Abdominal: She exhibits no distension.  Musculoskeletal: She exhibits no edema.  Neurological: She is alert and oriented to  person, place, and time. No cranial nerve deficit.  Skin: Skin is warm and dry.  Psychiatric: She has a normal mood and affect.  Nursing note and vitals reviewed.    ED Treatments / Results  Labs (all labs ordered are listed, but only abnormal results are displayed) Labs Reviewed  BASIC METABOLIC PANEL - Abnormal; Notable for the following:       Result Value   Potassium 3.1 (*)    Chloride 96 (*)    BUN 29 (*)    Creatinine, Ser 5.55 (*)    GFR calc non Af Amer 8 (*)    GFR calc Af Amer 9 (*)    Anion gap 16 (*)    All other components within normal limits  CBC - Abnormal; Notable for the following:    Hemoglobin 11.1 (*)    RDW 18.3 (*)    All other components within normal limits  BRAIN NATRIURETIC PEPTIDE - Abnormal; Notable for the following:    B Natriuretic Peptide >4,500.0 (*)    All other components within normal limits    EKG  EKG Interpretation None       Radiology Dg Chest 2 View  Result Date: 01/26/2017 CLINICAL DATA:  Cough for 7 days EXAM: CHEST  2 VIEW COMPARISON:  10/04/2016 FINDINGS: Moderate cardiomegaly. No pleural effusion. Streaky lower lobe opacities. Aortic atherosclerosis. No pneumothorax. IMPRESSION: 1. Cardiomegaly without edema 2. Streaky bibasilar opacities may reflect atelectasis or mild  infiltrates Electronically Signed   By: Donavan Foil M.D.   On: 01/26/2017 17:50    Procedures Procedures (including critical care time)  Medications Ordered in ED Medications  acetaminophen (TYLENOL) tablet 1,000 mg (not administered)  nitroGLYCERIN 50 mg in dextrose 5 % 250 mL (0.2 mg/mL) infusion (not administered)  hydrALAZINE (APRESOLINE) injection 10 mg (10 mg Intravenous Given 01/26/17 2229)     Initial Impression / Assessment and Plan / ED Course  I have reviewed the triage vital signs and the nursing notes.  Pertinent labs & imaging results that were available during my care of the patient were reviewed by me and considered in my medical decision making (see chart for details).   after the initial presentation with concern for fluid overload status, hypertensive crisis, the patient received hydralazine.  Update: I discussed patient's case with our nephrology colleaguespatient will be placed on hemodialysis list.  11:04 PM After initial hydralazine, the patient remains hypertensive, the patient will be started on a nitroglycerin drip.  Patient's BNP is beyond measurable  With concern for overloaded fluid status, as well as hypertensive crisis, the patient will require admission to the stepdown unit.  BP now beginning to trend down w SBP now less <200. Final Clinical Impressions(s) / ED Diagnoses  Hypertensive crisis Dyspnea  CRITICAL CARE Performed by: Carmin Muskrat Total critical care time: 35 minutes Critical care time was exclusive of separately billable procedures and treating other patients. Critical care was necessary to treat or prevent imminent or life-threatening deterioration. Critical care was time spent personally by me on the following activities: development of treatment plan with patient and/or surrogate as well as nursing, discussions with consultants, evaluation of patient's response to treatment, examination of patient, obtaining history from  patient or surrogate, ordering and performing treatments and interventions, ordering and review of laboratory studies, ordering and review of radiographic studies, pulse oximetry and re-evaluation of patient's condition.    Carmin Muskrat, MD 01/26/17 [phone]

## 2017-01-26 NOTE — ED Triage Notes (Signed)
The pt has had a cold and a cough for 7 days.  No temp she is dialysis that was dialyzed yesterday  Fistula rt arm she is a smoker

## 2017-01-27 LAB — RENAL FUNCTION PANEL
Albumin: 2.9 g/dL — ABNORMAL LOW (ref 3.5–5.0)
Anion gap: 16 — ABNORMAL HIGH (ref 5–15)
BUN: 33 mg/dL — ABNORMAL HIGH (ref 6–20)
CO2: 21 mmol/L — ABNORMAL LOW (ref 22–32)
Calcium: 9.3 mg/dL (ref 8.9–10.3)
Chloride: 99 mmol/L — ABNORMAL LOW (ref 101–111)
Creatinine, Ser: 6.52 mg/dL — ABNORMAL HIGH (ref 0.44–1.00)
GFR calc Af Amer: 8 mL/min — ABNORMAL LOW (ref >60)
GFR calc non Af Amer: 7 mL/min — ABNORMAL LOW (ref >60)
Glucose, Bld: 99 mg/dL (ref 65–99)
Phosphorus: 5.4 mg/dL — ABNORMAL HIGH (ref 2.5–4.6)
Potassium: 3.3 mmol/L — ABNORMAL LOW (ref 3.5–5.1)
Sodium: 136 mmol/L (ref 135–145)

## 2017-01-27 LAB — HIV ANTIBODY (ROUTINE TESTING W REFLEX): HIV SCREEN 4TH GENERATION: NONREACTIVE

## 2017-01-27 LAB — CBC
HEMATOCRIT: 35.2 % — AB (ref 36.0–46.0)
HEMOGLOBIN: 10.6 g/dL — AB (ref 12.0–15.0)
MCH: 27.3 pg (ref 26.0–34.0)
MCHC: 30.1 g/dL (ref 30.0–36.0)
MCV: 90.7 fL (ref 78.0–100.0)
Platelets: 207 10*3/uL (ref 150–400)
RBC: 3.88 MIL/uL (ref 3.87–5.11)
RDW: 18.9 % — AB (ref 11.5–15.5)
WBC: 7.4 10*3/uL (ref 4.0–10.5)

## 2017-01-27 LAB — BASIC METABOLIC PANEL
Anion gap: 16 — ABNORMAL HIGH (ref 5–15)
BUN: 33 mg/dL — AB (ref 6–20)
CALCIUM: 9.1 mg/dL (ref 8.9–10.3)
CHLORIDE: 96 mmol/L — AB (ref 101–111)
CO2: 23 mmol/L (ref 22–32)
CREATININE: 6.54 mg/dL — AB (ref 0.44–1.00)
GFR calc Af Amer: 8 mL/min — ABNORMAL LOW (ref >60)
GFR calc non Af Amer: 6 mL/min — ABNORMAL LOW (ref >60)
GLUCOSE: 97 mg/dL (ref 65–99)
Potassium: 3.2 mmol/L — ABNORMAL LOW (ref 3.5–5.1)
Sodium: 135 mmol/L (ref 135–145)

## 2017-01-27 LAB — MRSA PCR SCREENING: MRSA by PCR: NEGATIVE

## 2017-01-27 MED ORDER — ASPIRIN EC 81 MG PO TBEC
81.0000 mg | DELAYED_RELEASE_TABLET | Freq: Every day | ORAL | Status: DC
  Administered 2017-01-27 – 2017-01-29 (×3): 81 mg via ORAL
  Filled 2017-01-27 (×3): qty 1

## 2017-01-27 MED ORDER — CALCITRIOL 0.25 MCG PO CAPS
2.2500 ug | ORAL_CAPSULE | ORAL | Status: DC
  Administered 2017-01-28: 2.25 ug via ORAL
  Filled 2017-01-27: qty 1

## 2017-01-27 MED ORDER — PRO-STAT SUGAR FREE PO LIQD
30.0000 mL | Freq: Two times a day (BID) | ORAL | Status: DC
  Administered 2017-01-27: 30 mL via ORAL
  Filled 2017-01-27 (×5): qty 30

## 2017-01-27 MED ORDER — ONDANSETRON HCL 4 MG PO TABS: 4.0000 mg | ORAL_TABLET | Freq: Four times a day (QID) | ORAL | Status: DC | PRN

## 2017-01-27 MED ORDER — LISINOPRIL 20 MG PO TABS
40.0000 mg | ORAL_TABLET | Freq: Every day | ORAL | Status: DC
  Administered 2017-01-27 – 2017-01-28 (×3): 40 mg via ORAL
  Filled 2017-01-27 (×4): qty 2

## 2017-01-27 MED ORDER — CLONIDINE HCL 0.2 MG PO TABS
0.2000 mg | ORAL_TABLET | Freq: Three times a day (TID) | ORAL | Status: DC
  Administered 2017-01-27 – 2017-01-28 (×6): 0.2 mg via ORAL
  Filled 2017-01-27 (×6): qty 1

## 2017-01-27 MED ORDER — HEPARIN SODIUM (PORCINE) 5000 UNIT/ML IJ SOLN
5000.0000 [IU] | Freq: Three times a day (TID) | INTRAMUSCULAR | Status: DC
  Administered 2017-01-27 – 2017-01-30 (×6): 5000 [IU] via SUBCUTANEOUS
  Filled 2017-01-27 (×8): qty 1

## 2017-01-27 MED ORDER — SODIUM CHLORIDE 0.9 % IV SOLN
125.0000 mg | INTRAVENOUS | Status: DC
  Filled 2017-01-27: qty 10

## 2017-01-27 MED ORDER — HYDROCODONE-HOMATROPINE 5-1.5 MG/5ML PO SYRP
5.0000 mL | ORAL_SOLUTION | Freq: Four times a day (QID) | ORAL | Status: AC | PRN
  Administered 2017-01-27 (×2): 5 mL via ORAL
  Filled 2017-01-27 (×2): qty 5

## 2017-01-27 MED ORDER — HYDROCODONE-ACETAMINOPHEN 5-325 MG PO TABS
1.0000 | ORAL_TABLET | ORAL | Status: DC | PRN
  Administered 2017-01-27 – 2017-01-28 (×2): 1 via ORAL
  Filled 2017-01-27 (×2): qty 1

## 2017-01-27 MED ORDER — LABETALOL HCL 5 MG/ML IV SOLN
0.5000 mg/min | INTRAVENOUS | Status: DC
  Administered 2017-01-27: 0.5 mg/min via INTRAVENOUS
  Administered 2017-01-27 (×3): 2.5 mg/min via INTRAVENOUS
  Filled 2017-01-27 (×3): qty 100

## 2017-01-27 MED ORDER — AMLODIPINE BESYLATE 10 MG PO TABS
10.0000 mg | ORAL_TABLET | Freq: Every day | ORAL | Status: DC
Start: 2017-01-27 — End: 2017-01-30
  Administered 2017-01-27 – 2017-01-29 (×4): 10 mg via ORAL
  Filled 2017-01-27: qty 1
  Filled 2017-01-27: qty 2
  Filled 2017-01-27 (×2): qty 1

## 2017-01-27 MED ORDER — LABETALOL HCL 200 MG PO TABS
600.0000 mg | ORAL_TABLET | Freq: Three times a day (TID) | ORAL | Status: DC
  Administered 2017-01-27 – 2017-01-30 (×10): 600 mg via ORAL
  Filled 2017-01-27 (×10): qty 3

## 2017-01-27 MED ORDER — PANTOPRAZOLE SODIUM 40 MG PO TBEC
40.0000 mg | DELAYED_RELEASE_TABLET | Freq: Two times a day (BID) | ORAL | Status: DC
  Administered 2017-01-27 – 2017-01-29 (×6): 40 mg via ORAL
  Filled 2017-01-27 (×6): qty 1

## 2017-01-27 MED ORDER — SENNOSIDES-DOCUSATE SODIUM 8.6-50 MG PO TABS: 1.0000 | ORAL_TABLET | Freq: Every evening | ORAL | Status: DC | PRN

## 2017-01-27 MED ORDER — SEVELAMER CARBONATE 800 MG PO TABS
1600.0000 mg | ORAL_TABLET | Freq: Three times a day (TID) | ORAL | Status: DC
  Administered 2017-01-27 – 2017-01-30 (×7): 1600 mg via ORAL
  Filled 2017-01-27 (×8): qty 2

## 2017-01-27 MED ORDER — ONDANSETRON HCL 4 MG/2ML IJ SOLN: 4.0000 mg | Freq: Four times a day (QID) | INTRAMUSCULAR | Status: DC | PRN

## 2017-01-27 MED ORDER — RENA-VITE PO TABS
1.0000 | ORAL_TABLET | Freq: Every day | ORAL | Status: DC
  Administered 2017-01-27 – 2017-01-29 (×3): 1 via ORAL
  Filled 2017-01-27 (×3): qty 1

## 2017-01-27 MED ORDER — ALBUTEROL SULFATE (2.5 MG/3ML) 0.083% IN NEBU
2.5000 mg | INHALATION_SOLUTION | RESPIRATORY_TRACT | Status: DC | PRN
  Administered 2017-01-27: 2.5 mg via RESPIRATORY_TRACT
  Filled 2017-01-27: qty 3

## 2017-01-27 MED ORDER — LANTHANUM CARBONATE 500 MG PO CHEW
2000.0000 mg | CHEWABLE_TABLET | Freq: Three times a day (TID) | ORAL | Status: DC
  Administered 2017-01-27 – 2017-01-30 (×9): 2000 mg via ORAL
  Filled 2017-01-27 (×9): qty 4

## 2017-01-27 NOTE — Progress Notes (Signed)
PROGRESS NOTE    [name]  WFU:[id number] DOB: 02/17/1961 DOA: 01/26/2017 PCP: Benito Mccreedy, MD    Brief Narrative:  This is [name] 56 year old female with end-stage renal disease and hemodialysis 11 years. He has been compliant with dialysis. She states approximately week ago she developed [name] nonproductive cough that has persisted and worsened. She reports Miliana Gangwer wheeze and [name] headache that has persisted. She reports nausea and vomiting 1 today. She has shortness of breath but this is chronic, she states this is not significantly changed but she stopped smoking Lyris Hitchman week ago. She denies any fever, chills, burning urination. She does urinate small amounts. In the ER systolic blood pressures greater than 200, she denies any neurological deficits. She states that baseline her blood pressure is in the 170s to 180s. She states she took her medications today but that is what she vomited up. She finally came to ER.  She was placed on [name] labetalol gtt and her home medicines were resumed.    Assessment & Plan:   Principal Problem:   Fluid overload Active Problems:   End-stage renal disease on hemodialysis (HCC)   Lupus (systemic lupus erythematosus) (HCC)   History of stroke   Aortic stenosis   HTN (hypertension)   Hypertensive Emergency:  Improved - resume home meds - clonidine increased to 0.3 mg TID, amlodipine 10 mg, labetalol 600 mg TID, lisinopril 40 mg  - of note, hx of cocaine abuse as noted in renal note.  Discussed with patient who denies recent use, will try to get utox, she notes she makes some urine.  She was on labetalol gtt, so will continue beta blocker at this time, but may need to consider another agent with her hx of use.     ESRD with fluid overload - dialysis tomorrow per renal  Viral Illness  Cough:  Mild infiltrates on CXR, BNP greater than 4500 higher than baseline, but in renal dz, not sure of significance.  Echo 07/2016 with normal EF, grade 2 diastolic dysfunction.   Doesn't appear volume overloaded.   - pt with viral resp illness which started Friday after being exposed to family member who was sick - cough syrup prn, albuterol prn  - ctm   Aortic stenosis -aware, monitor  Hypokalemia -Status post recent dialysis. Monitor  Tobacco abuse -Nicotine patch, duonebs when necessary  History of CVA -Back to baseline. No residual weaknesses reported  GERD -Stable, home meds resumed  Hepatomegaly:  - on exam, normal LFT's.  F/u outpatient  . Lupus (systemic lupus erythematosus) (HCC) -Stable, home medications resumed  DVT prophylaxis: (heparin Code Status: full  Family Communication: none at bedside Disposition Plan: pending dialysis   Consultants:   nephrology  Procedures: (Don't include imaging studies which can be auto populated. Include things that cannot be auto populated i.e. Echo, Carotid and venous dopplers, Foley, Bipap, HD, tubes/drains, wound vac, central lines etc)  none  Antimicrobials: (specify start and planned stop date. Auto populated tables are space occupying and do not give end dates)  none    Subjective: Feeling ok.  Cough only thing bothering her.  Started on Friday.  She thinks it's [name] cold.   Objective: Vitals:   01/27/17 1315 01/27/17 1330 01/27/17 1345 01/27/17 1415  BP: (!) 171/96 (!) 163/93 (!) 152/92 (!) 156/92  Pulse: 72 66 66 67  Resp: (!) 27 18 17 16   Temp:      TempSrc:      SpO2: 99% 99% 97% 97%  Weight:  Height:        Intake/Output Summary (Last 24 hours) at 01/27/17 1615 Last data filed at 01/27/17 1358  Gross per 24 hour  Intake              460 ml  Output              200 ml  Net              260 ml   Filed Weights   01/26/17 1620  Weight: 59 kg (130 lb)    Examination:  General exam: Appears calm and comfortable  Respiratory system: Scattered wheezes and crackles, most notable on L side towards base Cardiovascular system: S1 & S2 heard, RRR. No JVD, murmurs, rubs,  gallops or clicks. No pedal edema. Gastrointestinal system: Abdomen is nondistended, soft and nontender. hepatomegaly. Normal bowel sounds heard. Central nervous system: Alert and oriented. No focal neurological deficits. Extremities: Symmetric 5 x 5 power. Skin: No rashes, lesions or ulcers Psychiatry: Judgement and insight appear normal. Mood & affect appropriate.     Data Reviewed: I have personally reviewed following labs and imaging studies  CBC:  Recent Labs Lab 01/26/17 1659 01/27/17 0441  WBC 7.7 7.4  HGB 11.1* 10.6*  HCT 36.8 35.2*  MCV 90.9 90.7  PLT 233 948   Basic Metabolic Panel:  Recent Labs Lab 01/26/17 1659 01/27/17 0441 01/27/17 0900  NA 137 135 136  K 3.1* 3.2* 3.3*  CL 96* 96* 99*  CO2 25 23 21*  GLUCOSE 96 97 99  BUN 29* 33* 33*  CREATININE 5.55* 6.54* 6.52*  CALCIUM 9.6 9.1 9.3  PHOS  --   --  5.4*   GFR: Estimated Creatinine Clearance: 9.2 mL/min ([name]) (by C-G formula based on SCr of 6.52 mg/dL (H)). Liver Function Tests:  Recent Labs Lab 01/27/17 0900  ALBUMIN 2.9*   No results for input(s): LIPASE, AMYLASE in the last 168 hours. No results for input(s): AMMONIA in the last 168 hours. Coagulation Profile: No results for input(s): INR, PROTIME in the last 168 hours. Cardiac Enzymes: No results for input(s): CKTOTAL, CKMB, CKMBINDEX, TROPONINI in the last 168 hours. BNP (last 3 results) No results for input(s): PROBNP in the last 8760 hours. HbA1C: No results for input(s): HGBA1C in the last 72 hours. CBG: No results for input(s): GLUCAP in the last 168 hours. Lipid Profile: No results for input(s): CHOL, HDL, LDLCALC, TRIG, CHOLHDL, LDLDIRECT in the last 72 hours. Thyroid Function Tests: No results for input(s): TSH, T4TOTAL, FREET4, T3FREE, THYROIDAB in the last 72 hours. Anemia Panel: No results for input(s): VITAMINB12, FOLATE, FERRITIN, TIBC, IRON, RETICCTPCT in the last 72 hours. Sepsis Labs: No results for input(s):  PROCALCITON, LATICACIDVEN in the last 168 hours.  No results found for this or any previous visit (from the past 240 hour(s)).       Radiology Studies: Dg Chest 2 View  Result Date: 01/26/2017 CLINICAL DATA:  Cough for 7 days EXAM: CHEST  2 VIEW COMPARISON:  10/04/2016 FINDINGS: Moderate cardiomegaly. No pleural effusion. Streaky lower lobe opacities. Aortic atherosclerosis. No pneumothorax. IMPRESSION: 1. Cardiomegaly without edema 2. Streaky bibasilar opacities may reflect atelectasis or mild infiltrates Electronically Signed   By: Donavan Foil M.D.   On: 01/26/2017 17:50        Scheduled Meds: . amLODipine  10 mg Oral QHS  . aspirin EC  81 mg Oral QHS  . [START ON 01/28/2017] calcitRIOL  2.25 mcg Oral Q M,W,F-HD  .  cloNIDine  0.2 mg Oral TID  . feeding supplement (PRO-STAT SUGAR FREE 64)  30 mL Oral BID  . heparin  5,000 Units Subcutaneous Q8H  . labetalol  600 mg Oral TID  . lanthanum  2,000 mg Oral TID WC  . lisinopril  40 mg Oral q1800  . multivitamin  1 tablet Oral QHS  . pantoprazole  40 mg Oral BID  . sevelamer carbonate  1,600 mg Oral TID WC   Continuous Infusions: . [START ON 01/28/2017] ferric gluconate (FERRLECIT/NULECIT) IV    . labetalol (NORMODYNE) infusion Stopped (01/27/17 1225)  . nitroGLYCERIN       LOS: 1 day    Time spent: [place], MD [facility] [phone]   If 7PM-7AM, please contact night-coverage [email or web address] Password [email or web address] 01/27/2017, 4:15 PM

## 2017-01-27 NOTE — ED Notes (Signed)
Labetalol drip empty. Pharmacy noticed x2. Awaiting drip to continue administration

## 2017-01-27 NOTE — ED Notes (Signed)
Per admitting Dr. Florene Glen patient to receive home medications at this time except labetalol.

## 2017-01-27 NOTE — Consult Note (Signed)
[name] Renal Consultation Note    Indication for Consultation:  Management of ESRD/hemodialysis; anemia, hypertension/volume and secondary hyperparathyroidism PCP:  HPI: Chosen [name] is a 56 y.o. female with ESRD on hemodialysis MWF at [facility]. PMH of lupus nephritis, HTN, diastolic HF,  Mild-mod AS Gr 2 DD bicuspid AV,mitral stenosis, EF 60-65% 07/24/16, Hx subarachnoid bleed after fall at home (2007), GERD, H/O hiatal hernia. Current smoker, history of cocaine use.   Patient presented to ED 01/26/17 with C/O cough and cold sx for one week without improvement with C/O SOB and wheezing. BP was 207/104 upon arrival to ED T-98.4 HR 81 O2 sats 99 on RA. She was started on Labetalol drip for hypertensive urgency. Moderate cardiomegaly. No pleural effusion. Streaky lower lobe opacities. WBC 7.4 HGB 10.6 PLT 207 K+ 3.3 Scr 6.52 BUN 33 Co2 21 Albumin 2.9.   Patient is alert, oriented X 3. Has hacking dry cough, says she "can't get the congestion to loosen up". She reports one week of cold sx that did not respond to OTC remedies and says she finally decided to get some help. She has not missed dialysis, rarely has high gains, has left close to [name] each treatment however now with bibasilar crackles, scattered wheezes on exam. No LE edema. Says she has not smoked in one week. She has been admitted per primary with hypertensive emergency, volume overload. She has no complaints other cough, headache and she can't tell what her breakfast is supposed to be!   Past Medical History:  Diagnosis Date  . Anemia   . Aortic stenosis 09/25/2016   Echo 07/24/16: Mod conc LVH, EF 60-65, no RWMA, Gr 2 DDd, bicuspid aortic valve, mild to mod AS (mean 18, peak 38), MAC, mod mitral stenosis (mean 9, peak 19), mild to mod MR, severe LAE, normal RVSF, mild RAE, mild TR  . Blood transfusion '08   [facility]  . Chronic diastolic CHF (congestive heart failure) ([place])   . Dysfunctional uterine bleeding  12/19/2010  . ESRD (end stage renal disease) ([place])    dialysis   Mon Wed Fri  . GERD (gastroesophageal reflux disease)   . Hemodialysis patient [facility])    right extremity port  . Hx of cardiovascular stress test    Lexiscan Myoview 4/16:  Normal stress nuclear study, EF 59%  . Hx of hiatal hernia   . Hypertension   . Lupus   . Mitral stenosis    Echo 4/16:  EF 55-60%, no RWMA, Gr 1 DD, mod MS (mean 9 mmHg), mod LAE, mild RAE, PASP 65, mod to severe TR, trivial eff  . Peptic ulcer disease   . Stroke [facility])    per patient "they said i had a small stroke but i couldnt even tell"   Past Surgical History:  Procedure Laterality Date  . DIALYSIS FISTULA CREATION  2007  . ENDOMETRIAL ABLATION    . ESOPHAGOGASTRODUODENOSCOPY N/A 07/31/2014   Procedure: ESOPHAGOGASTRODUODENOSCOPY (EGD);  Surgeon: Clarene Essex, MD;  Location: [facility] ENDOSCOPY;  Service: Endoscopy;  Laterality: N/A;  . SHOULDER OPEN ROTATOR CUFF REPAIR Right 10/09/2016   Procedure: ROTATOR CUFF REPAIR SHOULDER OPEN partial acrominectomy and extensive synovectomy;  Surgeon: Latanya [name], MD;  Location: [facility] ORS;  Service: Orthopedics;  Laterality: Right;  RNFA   Family History  Problem Relation Age of Onset  . Liver cancer Maternal Grandmother   . Lymphoma Maternal Aunt   . Heart attack Neg Hx    Social History:  reports that she  has been smoking Cigarettes.  She has been smoking about 0.25 packs per day. She has never used smokeless tobacco. She reports that she drinks alcohol. She reports that she uses drugs, including Marijuana. No Known Allergies Prior to Admission medications   Medication Sig Start Date End Date Taking? Authorizing Provider  amLODipine (NORVASC) 10 MG tablet Take 10 mg by mouth at bedtime.    Yes [provider]  aspirin EC 81 MG tablet Take 81 mg by mouth at bedtime.   Yes [provider]  B Complex-C-Folic Acid (RENA-VITE RX) 1 MG TABS Take 1 mg by mouth daily. 11/01/16  Yes [provider]  cloNIDine (CATAPRES) 0.2 MG tablet Take 1 tablet (0.2 mg total) by mouth 2 (two) times daily. 09/25/16  Yes Weaver, Scott T, PA-C  labetalol (NORMODYNE) 300 MG tablet Take 600 mg by mouth 3 (three) times daily.    Yes [provider]  lanthanum (FOSRENOL) 1000 MG chewable tablet Chew 2 tablets (2,000 mg total) by mouth 3 (three) times daily with meals. Patient taking differently: Chew 1,000 mg by mouth 3 (three) times daily with meals.  05/05/15  Yes Eugenie Filler, MD  lisinopril (PRINIVIL,ZESTRIL) 40 MG tablet Take 40 mg by mouth daily at 6 PM.  08/25/14  Yes [provider]  pantoprazole (PROTONIX) 40 MG tablet Take 1 tablet (40 mg total) by mouth 2 (two) times daily. 08/01/14  Yes Domenic Polite, MD  oxyCODONE-acetaminophen (PERCOCET/ROXICET) 5-325 MG tablet Take 1 tablet by mouth every 4 (four) hours as needed for moderate pain. Patient not taking: Reported on 01/26/2017 10/10/16   Danae Orleans, PA-C  polyethylene glycol (MIRALAX / Floria Raveling) packet Take 17 g by mouth daily as needed for mild constipation. Patient not taking: Reported on 01/26/2017 10/10/16   Danae Orleans, PA-C   Current Facility-Administered Medications  Medication Dose Route Frequency Provider Last Rate Last Dose  . amLODipine (NORVASC) tablet 10 mg  10 mg Oral QHS Crosley, Debby, MD   10 mg at 01/27/17 0902  . aspirin EC tablet 81 mg  81 mg Oral QHS Crosley, Debby, MD      . Derrill Memo ON 01/28/2017] calcitRIOL (ROCALTROL) capsule 2.25 mcg  2.25 mcg Oral Q M,W,F-HD Valentina Gu, NP      . cloNIDine (CATAPRES) tablet 0.2 mg  0.2 mg Oral TID Quintella Baton, MD   0.2 mg at 01/27/17 0901  . [START ON 01/28/2017] ferric gluconate (NULECIT) 125 mg in sodium chloride 0.9 % 100 mL IVPB  125 mg Intravenous Q M,W,F-HD Valentina Gu, NP      . heparin injection 5,000 Units  5,000 Units Subcutaneous Q8H Quintella Baton, MD   5,000 Units at 01/27/17 0829  . HYDROcodone-acetaminophen  (NORCO/VICODIN) 5-325 MG per tablet 1-2 tablet  1-2 tablet Oral Q4H PRN Quintella Baton, MD   1 tablet at 01/27/17 0823  . HYDROcodone-homatropine (HYCODAN) 5-1.5 MG/5ML syrup 5 mL  5 mL Oral Q6H PRN Ladene Artist., MD   5 mL at 01/27/17 5852  . labetalol (NORMODYNE) tablet 600 mg  600 mg Oral TID Claria Dice, Debby, MD      . labetalol (NORMODYNE,TRANDATE) 500 mg in dextrose 5 % 125 mL (4 mg/mL) infusion  0.5-3 mg/min Intravenous Titrated Crosley, Debby, MD 37.5 mL/hr at 01/27/17 1010 2.5 mg/min at 01/27/17 1010  . lanthanum (FOSRENOL) chewable tablet 2,000 mg  2,000 mg Oral TID WC Crosley, Debby, MD      . lisinopril (PRINIVIL,ZESTRIL) tablet 40 mg  40 mg Oral q1800 Quintella Baton, MD   40 mg at 01/27/17 0901  . nitroGLYCERIN 50 mg in dextrose 5 % 250 mL (0.2 mg/mL) infusion  0-200 mcg/min Intravenous Once Carmin Muskrat, MD      . ondansetron [facility]) tablet 4 mg  4 mg Oral Q6H PRN Quintella Baton, MD       Or  . ondansetron (ZOFRAN) injection 4 mg  4 mg Intravenous Q6H PRN Crosley, Debby, MD      . pantoprazole (PROTONIX) EC tablet 40 mg  40 mg Oral BID Crosley, Debby, MD      . senna-docusate (Senokot-S) tablet 1 tablet  1 tablet Oral QHS PRN Crosley, Debby, MD      . sevelamer carbonate (RENVELA) tablet 1,600 mg  1,600 mg Oral TID WC Valentina Gu, NP       Current Outpatient Prescriptions  Medication Sig Dispense Refill  . amLODipine (NORVASC) 10 MG tablet Take 10 mg by mouth at bedtime.     [name] aspirin EC 81 MG tablet Take 81 mg by mouth at bedtime.    . B Complex-C-Folic Acid (RENA-VITE RX) 1 MG TABS Take 1 mg by mouth daily.  6  . cloNIDine (CATAPRES) 0.2 MG tablet Take 1 tablet (0.2 mg total) by mouth 2 (two) times daily. 180 tablet 3  . labetalol (NORMODYNE) 300 MG tablet Take 600 mg by mouth 3 (three) times daily.     [name] lanthanum (FOSRENOL) 1000 MG chewable tablet Chew 2 tablets (2,000 mg total) by mouth 3 (three) times daily with meals. (Patient taking differently: Chew 1,000  mg by mouth 3 (three) times daily with meals. ) 180 tablet 0  . lisinopril (PRINIVIL,ZESTRIL) 40 MG tablet Take 40 mg by mouth daily at 6 PM.   3  . pantoprazole (PROTONIX) 40 MG tablet Take 1 tablet (40 mg total) by mouth 2 (two) times daily. 60 tablet 0  . oxyCODONE-acetaminophen (PERCOCET/ROXICET) 5-325 MG tablet Take 1 tablet by mouth every 4 (four) hours as needed for moderate pain. (Patient not taking: Reported on 01/26/2017) 36 tablet 0  . polyethylene glycol (MIRALAX / GLYCOLAX) packet Take 17 g by mouth daily as needed for mild constipation. (Patient not taking: Reported on 01/26/2017) 14 each 0   Labs: Basic Metabolic Panel:  Recent Labs Lab 01/26/17 1659 01/27/17 0441 01/27/17 0900  NA 137 135 136  K 3.1* 3.2* 3.3*  CL 96* 96* 99*  CO2 25 23 21*  GLUCOSE 96 97 99  BUN 29* 33* 33*  CREATININE 5.55* 6.54* 6.52*  CALCIUM 9.6 9.1 9.3  PHOS  --   --  5.4*   Liver Function Tests:  Recent Labs Lab 01/27/17 0900  ALBUMIN 2.9*   No results for input(s): LIPASE, AMYLASE in the last 168 hours. No results for input(s): AMMONIA in the last 168 hours. CBC:  Recent Labs Lab 01/26/17 1659 01/27/17 0441  WBC 7.7 7.4  HGB 11.1* 10.6*  HCT 36.8 35.2*  MCV 90.9 90.7  PLT 233 207   Cardiac Enzymes: No results for input(s): CKTOTAL, CKMB, CKMBINDEX, TROPONINI in the last 168 hours. CBG: No results for input(s): GLUCAP in the last 168 hours. Iron Studies: No results for input(s): IRON, TIBC, TRANSFERRIN, FERRITIN in the last 72 hours. Studies/Results: Dg Chest 2 View  Result Date: 01/26/2017 CLINICAL DATA:  Cough for 7 days EXAM: CHEST  2 VIEW COMPARISON:  10/04/2016 FINDINGS: Moderate cardiomegaly. No pleural effusion. Streaky lower lobe opacities. Aortic atherosclerosis. No pneumothorax. IMPRESSION:  1. Cardiomegaly without edema 2. Streaky bibasilar opacities may reflect atelectasis or mild infiltrates Electronically Signed   By: Donavan Foil M.D.   On: 01/26/2017 17:50     ROS: As per HPI otherwise negative.    Physical Exam: Vitals:   01/27/17 0955 01/27/17 1000 01/27/17 1015 01/27/17 1030  BP: (!) 181/100 (!) 181/99 (!) 169/93 (!) 173/95  Pulse: 65 66 72 65  Resp: 19 18 (!) 21 18  Temp:      TempSrc:      SpO2: 98% 99% 99% 98%  Weight:      Height:         General: Thin, chronically ill appearing female in NAD Head: Normocephalic, atraumatic, sclera non-icteric, mucus membranes are dry Neck: Supple. Prominent neck veins, mild JVD at 30 degrees.  Lungs: Bilateral breath sounds with bibasilar crackles, few scattered expiratory wheezes. Dry cough. No WOB.  Heart: RRR with S1 S2 2/6 systolic M 3rd ICS LSB Abdomen: Soft, non-tender, non-distended with normoactive bowel sounds. No rebound/guarding. No obvious abdominal masses. M-S:  Strength and tone appear normal for age. Lower extremities:without edema or ischemic changes, no open wounds  Neuro: Alert and oriented X 3. Moves all extremities spontaneously. Psych:  Very pleasant, responds to questions appropriately with a normal affect. Dialysis Access: RUA AVF aneurysmal + bruit  Dialysis Orders: [facility] MWF 4 hrs 180 NRe 57.5 kg 2.0 K/ 2.0 Ca 450/800 UF profile 2 -Heparin 5000 units IV TIW -Calcitriol 2.25 mcg PO TIW (Last PTH 254 12/26/16) -Mircera 225 mcg IV q 2 week (last dose 01/23/17 Last HGB 10.2) -Venofer 100 mg IV X 5 (3/5 doses have been given. Last Fe 35 Tsat 18% 12/26/16)  BMD meds: Fosrenol 1000 mg 2 tabs PO TID AC Renvela 800 mg 2 tabs PO TID AC (Last Ca 9.5 C Ca 9.7 Phos 7.4 12/26/16)  Assessment/Plan: 1.  Hypertensive Urgency: Started on labetalol drip-not responding well. BP still elevated. Has been given oral antihypertensive meds. H/O cocaine abuse. Avoid beta blockers.  2.  Possible viral Bronchitis: Cold sx for one week. Current smoker. Per primary.  3.  Volume overload: CXR does not show evidence of pulmonary edema but patient sounds wet. Will have HD today off schedule  to lower volume and hopefully lower BP as well. BNP not reliable in ESRD.  4.  ESRD -  MWF. HD today and again tomorrow on schedule. K+3.3 Use 4.0 K bath. Usual heparin dose.   5.  Hypertension/volume  - as noted above. UFG 2-2.5 liters today. OP meds: amlodipine 10 mg PO Q hs, Clonidine 0.1 mg PO BID, lisinopril 40 mg PO q day, labetolol 600 mg PO TID. Home Meds have been resumed.  6.  Anemia  - HGB 10.6. Recent ESA dose. Cont Fe load as she does not appear septic.  7.  Metabolic bone disease - Cont binders, VDRA. Ca 9.3 Phos 5.4 8.  Nutrition - Albumin 2.9. Renal diet with fld restrictions, add prostat, renal vits 9.  H/O SLE- not on prednisone/meds at present.   Rita H. Owens Shark, NP-C 01/27/2017, 10:46 AM  [facility] [phone]  Renal Attending: She has a hypertensive urgency and lower respiratory infection. No tox screen done to confirm cocaine use.  Would transition to PO meds as you are doing.  Will schedule routine dialysis treatment for Monday. Shoichi Mielke C

## 2017-01-27 NOTE — ED Notes (Signed)
Per admitting Dr. Florene Glen will stop labetalol drip at this time

## 2017-01-27 NOTE — ED Notes (Signed)
Attempted report 

## 2017-01-27 NOTE — ED Notes (Signed)
Admitting MD Dr. Florene Glen verbally ordering administration of labetalol PO and decreasing labetalol drip to 1.5mg /min

## 2017-01-28 DIAGNOSIS — I10 Essential (primary) hypertension: Secondary | ICD-10-CM

## 2017-01-28 DIAGNOSIS — B9789 Other viral agents as the cause of diseases classified elsewhere: Secondary | ICD-10-CM

## 2017-01-28 DIAGNOSIS — J988 Other specified respiratory disorders: Secondary | ICD-10-CM

## 2017-01-28 LAB — RENAL FUNCTION PANEL
ALBUMIN: 2.8 g/dL — AB (ref 3.5–5.0)
ANION GAP: 15 (ref 5–15)
BUN: 49 mg/dL — ABNORMAL HIGH (ref 6–20)
CALCIUM: 9.3 mg/dL (ref 8.9–10.3)
CO2: 24 mmol/L (ref 22–32)
Chloride: 94 mmol/L — ABNORMAL LOW (ref 101–111)
Creatinine, Ser: 8.99 mg/dL — ABNORMAL HIGH (ref 0.44–1.00)
GFR calc non Af Amer: 4 mL/min — ABNORMAL LOW (ref >60)
GFR, EST AFRICAN AMERICAN: 5 mL/min — AB (ref >60)
GLUCOSE: 142 mg/dL — AB (ref 65–99)
PHOSPHORUS: 5.8 mg/dL — AB (ref 2.5–4.6)
Potassium: 3.6 mmol/L (ref 3.5–5.1)
SODIUM: 133 mmol/L — AB (ref 135–145)

## 2017-01-28 LAB — CBC
HCT: 31.9 % — ABNORMAL LOW (ref 36.0–46.0)
HEMOGLOBIN: 9.8 g/dL — AB (ref 12.0–15.0)
MCH: 27.5 pg (ref 26.0–34.0)
MCHC: 30.7 g/dL (ref 30.0–36.0)
MCV: 89.4 fL (ref 78.0–100.0)
PLATELETS: 227 10*3/uL (ref 150–400)
RBC: 3.57 MIL/uL — AB (ref 3.87–5.11)
RDW: 19 % — ABNORMAL HIGH (ref 11.5–15.5)
WBC: 7.6 10*3/uL (ref 4.0–10.5)

## 2017-01-28 MED ORDER — LIDOCAINE-PRILOCAINE 2.5-2.5 % EX CREA: 1.0000 "application " | TOPICAL_CREAM | CUTANEOUS | Status: DC | PRN

## 2017-01-28 MED ORDER — CALCITRIOL 0.5 MCG PO CAPS
ORAL_CAPSULE | ORAL | Status: AC
  Filled 2017-01-28: qty 4

## 2017-01-28 MED ORDER — HEPARIN SODIUM (PORCINE) 1000 UNIT/ML DIALYSIS
5000.0000 [IU] | Freq: Once | INTRAMUSCULAR | Status: DC
  Filled 2017-01-28: qty 5

## 2017-01-28 MED ORDER — SODIUM CHLORIDE 0.9 % IV SOLN: 100.0000 mL | INTRAVENOUS | Status: DC | PRN

## 2017-01-28 MED ORDER — BENZONATATE 100 MG PO CAPS
100.0000 mg | ORAL_CAPSULE | Freq: Three times a day (TID) | ORAL | Status: DC | PRN
  Administered 2017-01-29 (×2): 100 mg via ORAL
  Filled 2017-01-28 (×2): qty 1

## 2017-01-28 MED ORDER — SODIUM CHLORIDE 0.9 % IV SOLN
125.0000 mg | INTRAVENOUS | Status: AC
  Administered 2017-01-28: 125 mg via INTRAVENOUS
  Filled 2017-01-28: qty 10

## 2017-01-28 MED ORDER — CALCITRIOL 0.25 MCG PO CAPS
ORAL_CAPSULE | ORAL | Status: AC
  Filled 2017-01-28: qty 1

## 2017-01-28 MED ORDER — PENTAFLUOROPROP-TETRAFLUOROETH EX AERO: 1.0000 "application " | INHALATION_SPRAY | CUTANEOUS | Status: DC | PRN

## 2017-01-28 MED ORDER — NEPRO/CARBSTEADY PO LIQD
237.0000 mL | Freq: Two times a day (BID) | ORAL | Status: DC
  Administered 2017-01-28 – 2017-01-29 (×3): 237 mL via ORAL
  Filled 2017-01-28 (×8): qty 237

## 2017-01-28 MED ORDER — LIDOCAINE HCL (PF) 1 % IJ SOLN: 5.0000 mL | INTRAMUSCULAR | Status: DC | PRN

## 2017-01-28 MED ORDER — HYDROCODONE-HOMATROPINE 5-1.5 MG/5ML PO SYRP
5.0000 mL | ORAL_SOLUTION | Freq: Four times a day (QID) | ORAL | Status: AC | PRN
  Administered 2017-01-28 – 2017-01-29 (×3): 5 mL via ORAL
  Filled 2017-01-28 (×3): qty 5

## 2017-01-28 NOTE — Progress Notes (Signed)
PROGRESS NOTE    [name]  ZMO:[id number] DOB: May 22, 1960 DOA: 01/26/2017 PCP: Benito Mccreedy, MD    Brief Narrative:  This is a 56 year old female with end-stage renal disease and hemodialysis 11 years. He has been compliant with dialysis. She states approximately week ago she developed a nonproductive cough that has persisted and worsened. She reports a wheeze and a headache that has persisted. She reports nausea and vomiting 1 today. She has shortness of breath but this is chronic, she states this is not significantly changed but she stopped smoking a week ago. She denies any fever, chills, burning urination. She does urinate small amounts. In the ER systolic blood pressures greater than 200, she denies any neurological deficits. She states that baseline her blood pressure is in the 170s to 180s. She states she took her medications today but that is what she vomited up. She finally came to ER.  She was placed on a labetalol gtt and her home medicines were resumed.    Assessment & Plan:   Principal Problem:   Fluid overload Active Problems:   End-stage renal disease on hemodialysis (HCC)   Lupus (systemic lupus erythematosus) (HCC)   History of stroke   Aortic stenosis   HTN (hypertension)   HTN  Hypertensive Emergency:  Improved, BP's still high.  She's asymptomatic from this.  Will continue to monitor after dialysis and on her home meds.  - resume home meds - clonidine 0.2 mg TID, amlodipine 10 mg, labetalol 600 mg TID, lisinopril 40 mg  - of note, hx of cocaine abuse as noted in renal note.  Discussed with patient who denies recent use, will try to get utox, she notes she makes some urine.  She was on labetalol gtt, so will continue beta blocker at this time, but may need to consider another agent with her hx of use.    [ ]  utox pending  ESRD with fluid overload - dialysis tomorrow per renal  Viral Respiratory Illness  Cough:  Mild infiltrates on CXR, BNP greater than  4500 higher than baseline, but in renal dz, not sure of significance.  Echo 07/2016 with normal EF, grade 2 diastolic dysfunction.  Doesn't appear volume overloaded.   - pt with viral resp illness which started Friday after being exposed to family member who was sick - cough syrup prn, tessalon perles, albuterol prn  - ctm   Aortic stenosis -aware, monitor  Hypokalemia -Status post recent dialysis. Monitor  Tobacco abuse -Nicotine patch, duonebs when necessary  History of CVA -Back to baseline. No residual weaknesses reported  GERD -Stable, home meds resumed  Hepatomegaly:  - on exam, normal LFT's.  F/u outpatient  . Lupus (systemic lupus erythematosus) (HCC) -Stable, home medications resumed  DVT prophylaxis: heparin Code Status: full  Family Communication: none at bedside Disposition Plan: pending dialysis   Consultants:   nephrology  Procedures: (Don't include imaging studies which can be auto populated. Include things that cannot be auto populated i.e. Echo, Carotid and venous dopplers, Foley, Bipap, HD, tubes/drains, wound vac, central lines etc)  none  Antimicrobials: (specify start and planned stop date. Auto populated tables are space occupying and do not give end dates)  none    Subjective: She'd prefer to stay another day because of her cough.  Still bothering her.  No CP or SOB.  Objective: Vitals:   01/28/17 1730 01/28/17 1740 01/28/17 1800 01/28/17 1814  BP: (!) 193/87 (!) 184/99 (!) 185/94 (!) 170/84  Pulse: 77 81 77  79  Resp:      Temp:      TempSrc:      SpO2:      Weight:      Height:        Intake/Output Summary (Last 24 hours) at 01/28/17 1827 Last data filed at 01/28/17 1800  Gross per 24 hour  Intake          1119.75 ml  Output               50 ml  Net          1069.75 ml   Filed Weights   01/27/17 1800 01/28/17 0402 01/28/17 1420  Weight: 59.1 kg (130 lb 4.7 oz) 61.4 kg (135 lb 5.8 oz) 59.6 kg (131 lb 6.3 oz)     Examination:  General: No acute distress. Cardiovascular: Heart sounds show a regular rate, and rhythm. No gallops or rubs. No murmurs. No JVD. Lungs: Good air movement, scattered wheezes. Abdomen: Soft, nontender, nondistended with normal active bowel sounds. No masses. No hepatosplenomegaly. Neurological: Alert and oriented 3. Moves all extremities 4 with equal strength. Cranial nerves II through XII grossly intact. Skin: Warm and dry. No rashes or lesions. Extremities: No clubbing or cyanosis. No edema. Pedal pulses 2+. Psychiatric: Mood and affect are normal. Insight and judgment are appropriate.   Data Reviewed: I have personally reviewed following labs and imaging studies  CBC:  Recent Labs Lab 01/26/17 1659 01/27/17 0441 01/28/17 1446  WBC 7.7 7.4 7.6  HGB 11.1* 10.6* 9.8*  HCT 36.8 35.2* 31.9*  MCV 90.9 90.7 89.4  PLT 233 207 258   Basic Metabolic Panel:  Recent Labs Lab 01/26/17 1659 01/27/17 0441 01/27/17 0900 01/28/17 1441  NA 137 135 136 133*  K 3.1* 3.2* 3.3* 3.6  CL 96* 96* 99* 94*  CO2 25 23 21* 24  GLUCOSE 96 97 99 142*  BUN 29* 33* 33* 49*  CREATININE 5.55* 6.54* 6.52* 8.99*  CALCIUM 9.6 9.1 9.3 9.3  PHOS  --   --  5.4* 5.8*   GFR: Estimated Creatinine Clearance: 6.2 mL/min (A) (by C-G formula based on SCr of 8.99 mg/dL (H)). Liver Function Tests:  Recent Labs Lab 01/27/17 0900 01/28/17 1441  ALBUMIN 2.9* 2.8*   No results for input(s): LIPASE, AMYLASE in the last 168 hours. No results for input(s): AMMONIA in the last 168 hours. Coagulation Profile: No results for input(s): INR, PROTIME in the last 168 hours. Cardiac Enzymes: No results for input(s): CKTOTAL, CKMB, CKMBINDEX, TROPONINI in the last 168 hours. BNP (last 3 results) No results for input(s): PROBNP in the last 8760 hours. HbA1C: No results for input(s): HGBA1C in the last 72 hours. CBG: No results for input(s): GLUCAP in the last 168 hours. Lipid Profile: No  results for input(s): CHOL, HDL, LDLCALC, TRIG, CHOLHDL, LDLDIRECT in the last 72 hours. Thyroid Function Tests: No results for input(s): TSH, T4TOTAL, FREET4, T3FREE, THYROIDAB in the last 72 hours. Anemia Panel: No results for input(s): VITAMINB12, FOLATE, FERRITIN, TIBC, IRON, RETICCTPCT in the last 72 hours. Sepsis Labs: No results for input(s): PROCALCITON, LATICACIDVEN in the last 168 hours.  Recent Results (from the past 240 hour(s))  MRSA PCR Screening     Status: None   Collection Time: 01/27/17  3:57 PM  Result Value Ref Range Status   MRSA by PCR NEGATIVE NEGATIVE Final    Comment:        The GeneXpert MRSA Assay (FDA approved for NASAL specimens  only), is one component of a comprehensive MRSA colonization surveillance program. It is not intended to diagnose MRSA infection nor to guide or monitor treatment for MRSA infections.          Radiology Studies: No results found.      Scheduled Meds: . amLODipine  10 mg Oral QHS  . aspirin EC  81 mg Oral QHS  . calcitRIOL  2.25 mcg Oral Q M,W,F-HD  . cloNIDine  0.2 mg Oral TID  . feeding supplement (NEPRO CARB STEADY)  237 mL Oral BID BM  . feeding supplement (PRO-STAT SUGAR FREE 64)  30 mL Oral BID  . heparin  5,000 Units Subcutaneous Q8H  . heparin  5,000 Units Dialysis Once in dialysis  . labetalol  600 mg Oral TID  . lanthanum  2,000 mg Oral TID WC  . lisinopril  40 mg Oral q1800  . multivitamin  1 tablet Oral QHS  . pantoprazole  40 mg Oral BID  . sevelamer carbonate  1,600 mg Oral TID WC   Continuous Infusions: . sodium chloride    . sodium chloride       LOS: 2 days    Time spent: McBride, MD [facility] [phone]   If 7PM-7AM, please contact night-coverage [email or web address] Password [facility] 01/28/2017, 6:27 PM

## 2017-01-28 NOTE — Progress Notes (Signed)
Transported to dialysis unit by bed , stable.

## 2017-01-28 NOTE — Progress Notes (Signed)
Subjective: Interval History: has complaints coughing but better.  Objective: Vital signs in last 24 hours: Temp:  [98.2 F (36.8 C)-98.9 F (37.2 C)] 98.9 F (37.2 C) (09/10 0402) Pulse Rate:  [64-74] 70 (09/10 0402) Resp:  [13-27] 19 (09/10 0402) BP: (147-192)/(79-114) 159/80 (09/10 0402) SpO2:  [89 %-100 %] 93 % (09/10 0402) FiO2 (%):  [28 %] 28 % (09/09 1746) Weight:  [59.1 kg (130 lb 4.7 oz)-61.4 kg (135 lb 5.8 oz)] 61.4 kg (135 lb 5.8 oz) (09/10 0402) Weight change: 0.132 kg (4.7 oz)  Intake/Output from previous day: 09/09 0701 - 09/10 0700 In: 1472.8 [P.O.:1120; I.V.:352.8] Out: 250 [Urine:250] Intake/Output this shift: No intake/output data recorded.  General appearance: alert, cooperative and no distress Resp: rales bibasilar, rhonchi bilaterally and wheezes bilaterally Cardio: S1, S2 normal and systolic murmur: systolic ejection 3/6, decrescendo at 2nd left intercostal space GI: soft, liver down 6 cm, pos bs Extremities: AVG ok.  Lab Results:  Recent Labs  01/26/17 1659 01/27/17 0441  WBC 7.7 7.4  HGB 11.1* 10.6*  HCT 36.8 35.2*  PLT 233 207   BMET:  Recent Labs  01/27/17 0441 01/27/17 0900  NA 135 136  K 3.2* 3.3*  CL 96* 99*  CO2 23 21*  GLUCOSE 97 99  BUN 33* 33*  CREATININE 6.54* 6.52*  CALCIUM 9.1 9.3   No results for input(s): PTH in the last 72 hours. Iron Studies: No results for input(s): IRON, TIBC, TRANSFERRIN, FERRITIN in the last 72 hours.  Studies/Results: Dg Chest 2 View  Result Date: 01/26/2017 CLINICAL DATA:  Cough for 7 days EXAM: CHEST  2 VIEW COMPARISON:  10/04/2016 FINDINGS: Moderate cardiomegaly. No pleural effusion. Streaky lower lobe opacities. Aortic atherosclerosis. No pneumothorax. IMPRESSION: 1. Cardiomegaly without edema 2. Streaky bibasilar opacities may reflect atelectasis or mild infiltrates Electronically Signed   By: Donavan Foil M.D.   On: 01/26/2017 17:50    I have reviewed the patient's current  medications.  Assessment/Plan: 1 ESRD for HD, lower vol/solute 2 NONADHERENCE  Does not take bp meds reg 3 Anemia need to cont max esa 4 HTN will come down when gets meds and vol control 5 Substance abuse SAH from Cocaine in past.  Ongoing issue, probale role in bp P HD, bronchodilators. REC SUBSTANCE ABUSE COUNSELING     LOS: 2 days   Wanita Derenzo L 01/28/2017,7:07 AM

## 2017-01-28 NOTE — Procedures (Signed)
I was present at this session.  I have reviewed the session itself and made appropriate changes.  HD via UA aVF, bp ^ to start, follow.  Access prss ok.   [name] L 9/10/20182:20 PM

## 2017-01-29 MED ORDER — CLONIDINE HCL 0.2 MG PO TABS
0.2000 mg | ORAL_TABLET | Freq: Two times a day (BID) | ORAL | Status: DC
  Administered 2017-01-29 – 2017-01-30 (×3): 0.2 mg via ORAL
  Filled 2017-01-29 (×3): qty 1

## 2017-01-29 MED ORDER — LISINOPRIL 20 MG PO TABS
40.0000 mg | ORAL_TABLET | Freq: Two times a day (BID) | ORAL | Status: DC
  Administered 2017-01-29 – 2017-01-30 (×3): 40 mg via ORAL
  Filled 2017-01-29 (×3): qty 2

## 2017-01-29 NOTE — Progress Notes (Signed)
Subjective: Interval History: has no complaint, feels a lot better..  Objective: Vital signs in last 24 hours: Temp:  [97.9 F (36.6 C)-99 F (37.2 C)] 99 F (37.2 C) (09/11 0339) Pulse Rate:  [73-83] 75 (09/11 0339) Resp:  [12-24] 19 (09/11 0339) BP: (154-196)/(81-107) 170/92 (09/11 0339) SpO2:  [96 %-100 %] 100 % (09/11 0339) Weight:  [57.3 kg (126 lb 5.2 oz)-59.6 kg (131 lb 6.3 oz)] 59.1 kg (130 lb 4.7 oz) (09/11 0339) Weight change: 0.5 kg (1 lb 1.6 oz)  Intake/Output from previous day: 09/10 0701 - 09/11 0700 In: 587 [P.O.:240; NG/GT:237; IV Piggyback:110] Out: 2245 [Urine:100] Intake/Output this shift: No intake/output data recorded.  General appearance: alert, cooperative and no distress Resp: rhonchi bilaterally and wheezes bibasilar Cardio: S1, S2 normal and systolic murmur: systolic ejection 2/6, decrescendo at 2nd left intercostal space GI: soft, pos bs, liver down  4cm Extremities: AVF RUA  Lab Results:  Recent Labs  01/27/17 0441 01/28/17 1446  WBC 7.4 7.6  HGB 10.6* 9.8*  HCT 35.2* 31.9*  PLT 207 227   BMET:  Recent Labs  01/27/17 0900 01/28/17 1441  NA 136 133*  K 3.3* 3.6  CL 99* 94*  CO2 21* 24  GLUCOSE 99 142*  BUN 33* 49*  CREATININE 6.52* 8.99*  CALCIUM 9.3 9.3   No results for input(s): PTH in the last 72 hours. Iron Studies: No results for input(s): IRON, TIBC, TRANSFERRIN, FERRITIN in the last 72 hours.  Studies/Results: No results found.  I have reviewed the patient's current medications.  Assessment/Plan: 1 ESRD bp improving with less vol , cont to lower 2 HTN improved, still not controlled, ^ lisinopril 3 Anemia lower, cont esa 4 HPTH vit D 5 Bronchitis improving P HD tomorrow, ^ lisin, cont esa    LOS: 3 days   Kanylah Muench L 01/29/2017,7:01 AM

## 2017-01-29 NOTE — Care Management Note (Signed)
Case Management Note  Patient Details  Name: [name] MRN: [id number] Date of Birth: 08/29/1960  Subjective/Objective:    Pt admitted with fluid overload                 Action/Plan:   PTA from home independent.  Pt is ESRD - on outpt HD for approximately 11 years on [employer].  Pt has PCP and denied barriers to obtaining/paying medications as prescribed.  CM will continue to follow for discharge needs   Expected Discharge Date:  01/29/17               Expected Discharge Plan:  Home/Self Care  In-House Referral:     Discharge planning Services  CM Consult  Post Acute Care Choice:    Choice offered to:     DME Arranged:    DME Agency:     HH Arranged:    HH Agency:     Status of Service:     If discussed at [employer], dates discussed:    Additional Comments:  Maryclare Labrador, RN 01/29/2017, 2:46 PM

## 2017-01-29 NOTE — Progress Notes (Signed)
PROGRESS NOTE    Sundeep Cary  JGG:[id number] DOB: 06-21-1960 DOA: 01/26/2017 PCP: Benito Mccreedy, MD    Brief Narrative:  This is Dinnis Rog 56 year old female with end-stage renal disease and hemodialysis 11 years. He has been compliant with dialysis. She states approximately week ago she developed Tyannah Sane nonproductive cough that has persisted and worsened. She reports Kolbi Altadonna wheeze and Toriano Aikey headache that has persisted. She reports nausea and vomiting 1 today. She has shortness of breath but this is chronic, she states this is not significantly changed but she stopped smoking Alessandro Griep week ago. She denies any fever, chills, burning urination. She does urinate small amounts. In the ER systolic blood pressures greater than 200, she denies any neurological deficits. She states that baseline her blood pressure is in the 170s to 180s. She states she took her medications today but that is what she vomited up. She finally came to ER.  She was placed on Josiah Wojtaszek labetalol gtt and her home medicines were resumed.    Discharge pending better BP control.  Assessment & Plan:   Principal Problem:   Fluid overload Active Problems:   End-stage renal disease on hemodialysis (HCC)   Lupus (systemic lupus erythematosus) (HCC)   History of stroke   Aortic stenosis   HTN (hypertension)   HTN  Hypertensive Emergency:  Improved, BP's still high.  She's asymptomatic from this.  Will continue to monitor after dialysis and on her home meds.  - resume home meds - clonidine 0.2 mg TID, amlodipine 10 mg, labetalol 600 mg TID, lisinopril 40 mg increased to BID - of note, hx of cocaine abuse as noted in renal note.  Discussed with patient who denies recent use, will try to get utox, she notes she makes some urine.  She was on labetalol gtt, so will continue beta blocker at this time, but may need to consider another agent with her hx of use.    [ ]  utox pending  ESRD with fluid overload - dialysis tomorrow per renal  Viral Respiratory  Illness  Cough:  Mild infiltrates on CXR, BNP greater than 4500 higher than baseline, but in renal dz, not sure of significance.  Echo 07/2016 with normal EF, grade 2 diastolic dysfunction.  Doesn't appear volume overloaded.   - pt with viral resp illness which started Friday after being exposed to family member who was sick - cough syrup prn, tessalon perles, albuterol prn  - ctm   Aortic stenosis -aware, monitor  Hypokalemia -Status post recent dialysis. Monitor  Tobacco abuse -Nicotine patch, duonebs when necessary  History of CVA -Back to baseline. No residual weaknesses reported  GERD -Stable, home meds resumed  Hepatomegaly:  - on exam, normal LFT's.  F/u outpatient  . Lupus (systemic lupus erythematosus) (HCC) -Stable, home medications resumed  DVT prophylaxis: heparin Code Status: full  Family Communication: none at bedside Disposition Plan: pending dialysis   Consultants:   nephrology  Procedures: (Don't include imaging studies which can be auto populated. Include things that cannot be auto populated i.e. Echo, Carotid and venous dopplers, Foley, Bipap, HD, tubes/drains, wound vac, central lines etc)  none  Antimicrobials: (specify start and planned stop date. Auto populated tables are space occupying and do not give end dates)  none    Subjective: Cough still.  No CP, SOB. Otherwise ok.    Objective: Vitals:   01/28/17 2212 01/28/17 2306 01/29/17 0339 01/29/17 0749  BP: (!) 177/93 (!) 154/81 (!) 170/92 (!) 166/90  Pulse:  75 75 77  Resp:  (!) 22 19 18   Temp:  98.9 F (37.2 C) 99 F (37.2 C) 98.7 F (37.1 C)  TempSrc:  Oral Oral Oral  SpO2:  99% 100% 100%  Weight:   59.1 kg (130 lb 4.7 oz)   Height:        Intake/Output Summary (Last 24 hours) at 01/29/17 1057 Last data filed at 01/29/17 1005  Gross per 24 hour  Intake              587 ml  Output             2345 ml  Net            -1758 ml   Filed Weights   01/28/17 1420 01/28/17  1830 01/29/17 0339  Weight: 59.6 kg (131 lb 6.3 oz) 57.3 kg (126 lb 5.2 oz) 59.1 kg (130 lb 4.7 oz)    Examination:  General: No acute distress. Cardiovascular: Heart sounds show Amunique Neyra regular rate, and rhythm. No gallops or rubs. No murmurs. No JVD. Lungs: Scattered wheezing. Unlabored.  Abdomen: Soft, nontender, nondistended with normal active bowel sounds. No masses. No hepatosplenomegaly. Neurological: Alert and oriented 3. Moves all extremities 4 with equal strength. Cranial nerves II through XII grossly intact. Skin: Warm and dry. No rashes or lesions. Extremities: No clubbing or cyanosis. No edema. Pedal pulses 2+.   Data Reviewed: I have personally reviewed following labs and imaging studies  CBC:  Recent Labs Lab 01/26/17 1659 01/27/17 0441 01/28/17 1446  WBC 7.7 7.4 7.6  HGB 11.1* 10.6* 9.8*  HCT 36.8 35.2* 31.9*  MCV 90.9 90.7 89.4  PLT 233 207 867   Basic Metabolic Panel:  Recent Labs Lab 01/26/17 1659 01/27/17 0441 01/27/17 0900 01/28/17 1441  NA 137 135 136 133*  K 3.1* 3.2* 3.3* 3.6  CL 96* 96* 99* 94*  CO2 25 23 21* 24  GLUCOSE 96 97 99 142*  BUN 29* 33* 33* 49*  CREATININE 5.55* 6.54* 6.52* 8.99*  CALCIUM 9.6 9.1 9.3 9.3  PHOS  --   --  5.4* 5.8*   GFR: Estimated Creatinine Clearance: 6.2 mL/min (Nat Lowenthal) (by C-G formula based on SCr of 8.99 mg/dL (H)). Liver Function Tests:  Recent Labs Lab 01/27/17 0900 01/28/17 1441  ALBUMIN 2.9* 2.8*   No results for input(s): LIPASE, AMYLASE in the last 168 hours. No results for input(s): AMMONIA in the last 168 hours. Coagulation Profile: No results for input(s): INR, PROTIME in the last 168 hours. Cardiac Enzymes: No results for input(s): CKTOTAL, CKMB, CKMBINDEX, TROPONINI in the last 168 hours. BNP (last 3 results) No results for input(s): PROBNP in the last 8760 hours. HbA1C: No results for input(s): HGBA1C in the last 72 hours. CBG: No results for input(s): GLUCAP in the last 168 hours. Lipid  Profile: No results for input(s): CHOL, HDL, LDLCALC, TRIG, CHOLHDL, LDLDIRECT in the last 72 hours. Thyroid Function Tests: No results for input(s): TSH, T4TOTAL, FREET4, T3FREE, THYROIDAB in the last 72 hours. Anemia Panel: No results for input(s): VITAMINB12, FOLATE, FERRITIN, TIBC, IRON, RETICCTPCT in the last 72 hours. Sepsis Labs: No results for input(s): PROCALCITON, LATICACIDVEN in the last 168 hours.  Recent Results (from the past 240 hour(s))  MRSA PCR Screening     Status: None   Collection Time: 01/27/17  3:57 PM  Result Value Ref Range Status   MRSA by PCR NEGATIVE NEGATIVE Final    Comment:        The GeneXpert MRSA Assay (  FDA approved for NASAL specimens only), is one component of Cayden Granholm comprehensive MRSA colonization surveillance program. It is not intended to diagnose MRSA infection nor to guide or monitor treatment for MRSA infections.          Radiology Studies: No results found.      Scheduled Meds: . amLODipine  10 mg Oral QHS  . aspirin EC  81 mg Oral QHS  . calcitRIOL  2.25 mcg Oral Q M,W,F-HD  . cloNIDine  0.2 mg Oral BID  . feeding supplement (NEPRO CARB STEADY)  237 mL Oral BID BM  . feeding supplement (PRO-STAT SUGAR FREE 64)  30 mL Oral BID  . heparin  5,000 Units Subcutaneous Q8H  . labetalol  600 mg Oral TID  . lanthanum  2,000 mg Oral TID WC  . lisinopril  40 mg Oral BID  . multivitamin  1 tablet Oral QHS  . pantoprazole  40 mg Oral BID  . sevelamer carbonate  1,600 mg Oral TID WC   Continuous Infusions:    LOS: 3 days    Time spent: Rosamond, MD [facility] [phone]   If 7PM-7AM, please contact night-coverage [email or web address] Password [facility] 01/29/2017, 10:57 AM

## 2017-01-29 NOTE — Plan of Care (Signed)
Problem: Education: Goal: Knowledge of [facility] Education information/materials will improve Outcome: Progressing POC reviewed with pt.; moderate amt. coughing and Hycodan given.

## 2017-01-30 DIAGNOSIS — N186 End stage renal disease: Secondary | ICD-10-CM

## 2017-01-30 DIAGNOSIS — I169 Hypertensive crisis, unspecified: Secondary | ICD-10-CM

## 2017-01-30 DIAGNOSIS — Z8673 Personal history of transient ischemic attack (TIA), and cerebral infarction without residual deficits: Secondary | ICD-10-CM

## 2017-01-30 DIAGNOSIS — M329 Systemic lupus erythematosus, unspecified: Secondary | ICD-10-CM

## 2017-01-30 DIAGNOSIS — I350 Nonrheumatic aortic (valve) stenosis: Secondary | ICD-10-CM

## 2017-01-30 DIAGNOSIS — Z992 Dependence on renal dialysis: Secondary | ICD-10-CM

## 2017-01-30 LAB — RENAL FUNCTION PANEL
ALBUMIN: 2.8 g/dL — AB (ref 3.5–5.0)
ANION GAP: 11 (ref 5–15)
BUN: 33 mg/dL — ABNORMAL HIGH (ref 6–20)
CO2: 25 mmol/L (ref 22–32)
Calcium: 9.7 mg/dL (ref 8.9–10.3)
Chloride: 96 mmol/L — ABNORMAL LOW (ref 101–111)
Creatinine, Ser: 6.99 mg/dL — ABNORMAL HIGH (ref 0.44–1.00)
GFR, EST AFRICAN AMERICAN: 7 mL/min — AB (ref >60)
GFR, EST NON AFRICAN AMERICAN: 6 mL/min — AB (ref >60)
Glucose, Bld: 84 mg/dL (ref 65–99)
PHOSPHORUS: 4.1 mg/dL (ref 2.5–4.6)
POTASSIUM: 3.8 mmol/L (ref 3.5–5.1)
Sodium: 132 mmol/L — ABNORMAL LOW (ref 135–145)

## 2017-01-30 LAB — CBC
HEMATOCRIT: 32.4 % — AB (ref 36.0–46.0)
HEMOGLOBIN: 9.9 g/dL — AB (ref 12.0–15.0)
MCH: 27.7 pg (ref 26.0–34.0)
MCHC: 30.6 g/dL (ref 30.0–36.0)
MCV: 90.8 fL (ref 78.0–100.0)
Platelets: 231 10*3/uL (ref 150–400)
RBC: 3.57 MIL/uL — AB (ref 3.87–5.11)
RDW: 19.3 % — AB (ref 11.5–15.5)
WBC: 7 10*3/uL (ref 4.0–10.5)

## 2017-01-30 MED ORDER — ALTEPLASE 2 MG IJ SOLR: 2.0000 mg | Freq: Once | INTRAMUSCULAR | Status: DC | PRN

## 2017-01-30 MED ORDER — SODIUM CHLORIDE 0.9 % IV SOLN: 100.0000 mL | INTRAVENOUS | Status: DC | PRN

## 2017-01-30 MED ORDER — DARBEPOETIN ALFA 200 MCG/0.4ML IJ SOSY
PREFILLED_SYRINGE | INTRAMUSCULAR | Status: AC
  Administered 2017-01-30: 200 ug via INTRAVENOUS
  Filled 2017-01-30: qty 0.4

## 2017-01-30 MED ORDER — LIDOCAINE HCL (PF) 1 % IJ SOLN: 5.0000 mL | INTRAMUSCULAR | Status: DC | PRN

## 2017-01-30 MED ORDER — DARBEPOETIN ALFA 200 MCG/0.4ML IJ SOSY
200.0000 ug | PREFILLED_SYRINGE | INTRAMUSCULAR | Status: DC
  Administered 2017-01-30: 200 ug via INTRAVENOUS
  Filled 2017-01-30: qty 0.4

## 2017-01-30 MED ORDER — LIDOCAINE-PRILOCAINE 2.5-2.5 % EX CREA: 1.0000 "application " | TOPICAL_CREAM | CUTANEOUS | Status: DC | PRN

## 2017-01-30 MED ORDER — HEPARIN SODIUM (PORCINE) 1000 UNIT/ML DIALYSIS: 100.0000 [IU]/kg | INTRAMUSCULAR | Status: DC | PRN

## 2017-01-30 MED ORDER — LISINOPRIL 40 MG PO TABS: 40.0000 mg | ORAL_TABLET | Freq: Two times a day (BID) | ORAL | 0 refills | Status: DC

## 2017-01-30 MED ORDER — BENZONATATE 100 MG PO CAPS: 100.0000 mg | ORAL_CAPSULE | Freq: Three times a day (TID) | ORAL | 0 refills | Status: DC | PRN

## 2017-01-30 MED ORDER — HEPARIN SODIUM (PORCINE) 1000 UNIT/ML DIALYSIS: 1000.0000 [IU] | INTRAMUSCULAR | Status: DC | PRN

## 2017-01-30 MED ORDER — PENTAFLUOROPROP-TETRAFLUOROETH EX AERO: 1.0000 "application " | INHALATION_SPRAY | CUTANEOUS | Status: DC | PRN

## 2017-01-30 MED ORDER — SEVELAMER CARBONATE 800 MG PO TABS
1600.0000 mg | ORAL_TABLET | Freq: Three times a day (TID) | ORAL | 0 refills | Status: AC
Start: 2017-01-30 — End: 2017-03-01

## 2017-01-30 NOTE — Discharge Summary (Signed)
Physician Discharge Summary  [name] PYP:[id number] DOB: 1960-07-10 DOA: 01/26/2017  PCP: Benito Mccreedy, MD  Admit date: 01/26/2017 Discharge date: 01/30/2017  Admitted From: Home Disposition:  Home  Recommendations for Outpatient Follow-up:  1. Follow up with PCP in 1week 2. Follow-up with outpatient dialysis as scheduled   Home Health: No  Equipment/Devices: None  Discharge Condition: stable  CODE STATUS: Full Diet recommendation: Heart Healthy / renal hemodialysis diet  Brief/Interim Summary: 56 year old female with history of end-stage renal disease on hemodialysis, hypertension, Aortic stenosis, CVA, SLE presented with worsening cough and was found to have extremely elevated blood pressure. Patient's condition has improved after admission. Her blood pressures have improved. She was evaluated by nephrology and dialysis was continued as scheduled. Discharge Diagnoses:  Principal Problem:   Fluid overload Active Problems:   End-stage renal disease on hemodialysis (HCC)   Lupus (systemic lupus erythematosus) (HCC)   History of stroke   Aortic stenosis   HTN (hypertension)  HTN  Hypertensive Emergency:  Improving, BP's still high.  She's asymptomatic from this.  - Nephrology has cleared the patient for discharge. Outpatient follow-up for adjustment of antihypertensives resume home meds - Continue current doses of clonidine, amlodipine, labetalol and lisinopril  - Was initially started on labetalol drip which has already been discontinued   ESRD with fluid overload - Dialysis continued inpatient. Outpatient dialysis as scheduled  Viral Respiratory Illness  Cough:  Mild infiltrates on CXR, BNP greater than 4500 higher than baseline, but in renal dz, not sure of significance.  Echo 07/2016 with normal EF, grade 2 diastolic dysfunction.  Doesn't appear volume overloaded.   - pt with viral resp illness which started Friday after being exposed to family member who was  sick - Outpatient follow-up. No need for antibiotics  Aortic stenosis -aware, monitor  Hypokalemia -Improved  Tobacco abuse -Tobacco cessation  History of CVA -Back to baseline. No residual weaknesses reported  GERD -Stable, home meds resumed  . Lupus (systemic lupus erythematosus)  -Stable, home medications resumed  Discharge Instructions  Discharge Instructions    Call MD for:  difficulty breathing, headache or visual disturbances    Complete by:  As directed    Call MD for:  extreme fatigue    Complete by:  As directed    Call MD for:  hives    Complete by:  As directed    Call MD for:  persistant dizziness or light-headedness    Complete by:  As directed    Call MD for:  persistant nausea and vomiting    Complete by:  As directed    Call MD for:  severe uncontrolled pain    Complete by:  As directed    Call MD for:  temperature >100.4    Complete by:  As directed    Diet - low sodium heart healthy    Complete by:  As directed    Discharge instructions    Complete by:  As directed    Renal Hemodialysis diet   Increase activity slowly    Complete by:  As directed      Allergies as of 01/30/2017   No Known Allergies     Medication List    STOP taking these medications   oxyCODONE-acetaminophen 5-325 MG tablet Commonly known as:  PERCOCET/ROXICET   polyethylene glycol packet Commonly known as:  MIRALAX / GLYCOLAX     TAKE these medications   amLODipine 10 MG tablet Commonly known as:  NORVASC Take 10 mg by  mouth at bedtime.   aspirin EC 81 MG tablet Take 81 mg by mouth at bedtime.   benzonatate 100 MG capsule Commonly known as:  TESSALON Take 1 capsule (100 mg total) by mouth 3 (three) times daily as needed for cough.   cloNIDine 0.2 MG tablet Commonly known as:  CATAPRES Take 1 tablet (0.2 mg total) by mouth 2 (two) times daily.   labetalol 300 MG tablet Commonly known as:  NORMODYNE Take 600 mg by mouth 3 (three) times daily.    lanthanum 1000 MG chewable tablet Commonly known as:  FOSRENOL Chew 2 tablets (2,000 mg total) by mouth 3 (three) times daily with meals. What changed:  how much to take   lisinopril 40 MG tablet Commonly known as:  PRINIVIL,ZESTRIL Take 1 tablet (40 mg total) by mouth 2 (two) times daily. What changed:  when to take this   pantoprazole 40 MG tablet Commonly known as:  PROTONIX Take 1 tablet (40 mg total) by mouth 2 (two) times daily.   RENA-VITE RX 1 MG Tabs Take 1 mg by mouth daily.   sevelamer carbonate 800 MG tablet Commonly known as:  RENVELA Take 2 tablets (1,600 mg total) by mouth 3 (three) times daily with meals.            Discharge Care Instructions        Start     Ordered   01/30/17 0000  benzonatate (TESSALON) 100 MG capsule  3 times daily PRN     01/30/17 1208   01/30/17 0000  lisinopril (PRINIVIL,ZESTRIL) 40 MG tablet  2 times daily     01/30/17 1208   01/30/17 0000  sevelamer carbonate (RENVELA) 800 MG tablet  3 times daily with meals     01/30/17 1208   01/30/17 0000  Increase activity slowly     01/30/17 1208   01/30/17 0000  Diet - low sodium heart healthy     01/30/17 1208   01/30/17 0000  Discharge instructions    Comments:  Renal Hemodialysis diet   01/30/17 1208   01/30/17 0000  Call MD for:  temperature >100.4     01/30/17 1208   01/30/17 0000  Call MD for:  persistant nausea and vomiting     01/30/17 1208   01/30/17 0000  Call MD for:  severe uncontrolled pain     01/30/17 1208   01/30/17 0000  Call MD for:  difficulty breathing, headache or visual disturbances     01/30/17 1208   01/30/17 0000  Call MD for:  hives     01/30/17 1208   01/30/17 0000  Call MD for:  persistant dizziness or light-headedness     01/30/17 1208   01/30/17 0000  Call MD for:  extreme fatigue     01/30/17 1208     Follow-up Information    Osei-Bonsu, Iona Beard, MD Follow up in 1 week(s).   Specialty:  Internal Medicine Contact information: [address] [place] [address] [phone]        Mauricia Area, MD Follow up in 1 week(s).   Specialty:  Nephrology Contact information: [place] [address] [phone]          No Known Allergies  Consultations:  Nephrology   Procedures/Studies: Dg Chest 2 View  Result Date: 01/26/2017 CLINICAL DATA:  Cough for 7 days EXAM: CHEST  2 VIEW COMPARISON:  10/04/2016 FINDINGS: Moderate cardiomegaly. No pleural effusion. Streaky lower lobe opacities. Aortic atherosclerosis.  No pneumothorax. IMPRESSION: 1. Cardiomegaly without edema 2. Streaky bibasilar opacities may reflect atelectasis or mild infiltrates Electronically Signed   By: Donavan Foil M.D.   On: 01/26/2017 17:50     Subjective: Patient seen and examined at bedside undergoing dialysis. She denies any overnight fever, nausea or vomiting.  Discharge Exam: Vitals:   01/30/17 1100 01/30/17 1130  BP: (!) 177/98 (!) 176/98  Pulse: 75 76  Resp:    Temp:    SpO2:     Vitals:   01/30/17 1000 01/30/17 1030 01/30/17 1100 01/30/17 1130  BP: (!) 169/93 (!) 181/97 (!) 177/98 (!) 176/98  Pulse: 74 75 75 76  Resp:      Temp:      TempSrc:      SpO2:      Weight:      Height:        General: Pt is alert, awake, not in acute distress Cardiovascular: Rate controlled, S1/S2 + Respiratory: Bilateral decreased breath sounds at bases  Abdominal: Soft, NT, ND, bowel sounds + Extremities: no edema, no cyanosis    The results of significant diagnostics from this hospitalization (including imaging, microbiology, ancillary and laboratory) are listed below for reference.     Microbiology: Recent Results (from the past 240 hour(s))  MRSA PCR Screening     Status: None   Collection Time: 01/27/17  3:57 PM  Result Value Ref Range Status   MRSA by PCR NEGATIVE NEGATIVE Final    Comment:        The GeneXpert MRSA Assay (FDA approved for NASAL specimens only), is one component of  a comprehensive MRSA colonization surveillance program. It is not intended to diagnose MRSA infection nor to guide or monitor treatment for MRSA infections.      Labs: BNP (last 3 results)  Recent Labs  01/26/17 2042  BNP >8,657.8*   Basic Metabolic Panel:  Recent Labs Lab 01/26/17 1659 01/27/17 0441 01/27/17 0900 01/28/17 1441 01/30/17 0800  NA 137 135 136 133* 132*  K 3.1* 3.2* 3.3* 3.6 3.8  CL 96* 96* 99* 94* 96*  CO2 25 23 21* 24 25  GLUCOSE 96 97 99 142* 84  BUN 29* 33* 33* 49* 33*  CREATININE 5.55* 6.54* 6.52* 8.99* 6.99*  CALCIUM 9.6 9.1 9.3 9.3 9.7  PHOS  --   --  5.4* 5.8* 4.1   Liver Function Tests:  Recent Labs Lab 01/27/17 0900 01/28/17 1441 01/30/17 0800  ALBUMIN 2.9* 2.8* 2.8*   No results for input(s): LIPASE, AMYLASE in the last 168 hours. No results for input(s): AMMONIA in the last 168 hours. CBC:  Recent Labs Lab 01/26/17 1659 01/27/17 0441 01/28/17 1446 01/30/17 0800  WBC 7.7 7.4 7.6 7.0  HGB 11.1* 10.6* 9.8* 9.9*  HCT 36.8 35.2* 31.9* 32.4*  MCV 90.9 90.7 89.4 90.8  PLT 233 207 227 231   Cardiac Enzymes: No results for input(s): CKTOTAL, CKMB, CKMBINDEX, TROPONINI in the last 168 hours. BNP: Invalid input(s): POCBNP CBG: No results for input(s): GLUCAP in the last 168 hours. D-Dimer No results for input(s): DDIMER in the last 72 hours. Hgb A1c No results for input(s): HGBA1C in the last 72 hours. Lipid Profile No results for input(s): CHOL, HDL, LDLCALC, TRIG, CHOLHDL, LDLDIRECT in the last 72 hours. Thyroid function studies No results for input(s): TSH, T4TOTAL, T3FREE, THYROIDAB in the last 72 hours.  Invalid input(s): FREET3 Anemia work up No results for input(s): VITAMINB12, FOLATE, FERRITIN, TIBC, IRON, RETICCTPCT in the last  72 hours. Urinalysis    Component Value Date/Time   COLORURINE YELLOW 05/01/2015 2005   APPEARANCEUR CLEAR 05/01/2015 2005   LABSPEC 1.012 05/01/2015 2005   PHURINE 8.5 (H) 05/01/2015  2005   GLUCOSEU 100 (A) 05/01/2015 2005   HGBUR SMALL (A) 05/01/2015 2005   BILIRUBINUR NEGATIVE 05/01/2015 2005   KETONESUR NEGATIVE 05/01/2015 2005   PROTEINUR 100 (A) 05/01/2015 2005   UROBILINOGEN 0.2 07/27/2014 0150   NITRITE NEGATIVE 05/01/2015 2005   LEUKOCYTESUR NEGATIVE 05/01/2015 2005   Sepsis Labs Invalid input(s): PROCALCITONIN,  WBC,  LACTICIDVEN Microbiology Recent Results (from the past 240 hour(s))  MRSA PCR Screening     Status: None   Collection Time: 01/27/17  3:57 PM  Result Value Ref Range Status   MRSA by PCR NEGATIVE NEGATIVE Final    Comment:        The GeneXpert MRSA Assay (FDA approved for NASAL specimens only), is one component of a comprehensive MRSA colonization surveillance program. It is not intended to diagnose MRSA infection nor to guide or monitor treatment for MRSA infections.      Time coordinating discharge: 35 minutes  SIGNED:   Aline August, MD  [facility] 01/30/2017, 12:10 PM Pager: [phone]  If 7PM-7AM, please contact night-coverage [email or web address] Password [email or web address]

## 2017-01-30 NOTE — Procedures (Signed)
I was present at this session.  I have reviewed the session itself and made appropriate changes.  Bp^, removing vol.  Access prss ok.   Shonte Soderlund L 9/12/20189:32 AM

## 2017-01-30 NOTE — Progress Notes (Signed)
Subjective: Interval History: has complaints still coughing.  Objective: Vital signs in last 24 hours: Temp:  [98.4 F (36.9 C)-98.8 F (37.1 C)] 98.4 F (36.9 C) (09/12 0416) Pulse Rate:  [73-82] 75 (09/12 0416) Resp:  [13-22] 17 (09/12 0416) BP: (164-180)/(45-95) 173/88 (09/12 0416) SpO2:  [94 %-100 %] 97 % (09/12 0416) Weight change:   Intake/Output from previous day: 09/11 0701 - 09/12 0700 In: 1104 [P.O.:1104] Out: 100 [Urine:100] Intake/Output this shift: No intake/output data recorded.  General appearance: alert, cooperative and no distress Resp: diminished breath sounds bilaterally and rhonchi bibasilar Cardio: S1, S2 normal and systolic murmur: systolic ejection 2/6, decrescendo at 2nd left intercostal space GI: soft, pos bs, liver down 4 cm Extremities: AVF RUA  Lab Results:  Recent Labs  01/28/17 1446  WBC 7.6  HGB 9.8*  HCT 31.9*  PLT 227   BMET:  Recent Labs  01/27/17 0900 01/28/17 1441  NA 136 133*  K 3.3* 3.6  CL 99* 94*  CO2 21* 24  GLUCOSE 99 142*  BUN 33* 49*  CREATININE 6.52* 8.99*  CALCIUM 9.3 9.3   No results for input(s): PTH in the last 72 hours. Iron Studies: No results for input(s): IRON, TIBC, TRANSFERRIN, FERRITIN in the last 72 hours.  Studies/Results: No results found.  I have reviewed the patient's current medications.  Assessment/Plan: 1 ESRD for HD 2 HTN vol xs, meds 3 Anemia cont max esa 4 HPTH vit D 5 Bronchitis  6 SUBSTANCE ABUSE P HD, esa, bp meds. Ok to d/c    LOS: 4 days   Jaquasia Doscher L 01/30/2017,7:10 AM

## 2017-02-01 DIAGNOSIS — D631 Anemia in chronic kidney disease: Secondary | ICD-10-CM | POA: Diagnosis not present

## 2017-02-01 DIAGNOSIS — M3210 Systemic lupus erythematosus, organ or system involvement unspecified: Secondary | ICD-10-CM | POA: Diagnosis not present

## 2017-02-01 DIAGNOSIS — N2581 Secondary hyperparathyroidism of renal origin: Secondary | ICD-10-CM | POA: Diagnosis not present

## 2017-02-01 DIAGNOSIS — D509 Iron deficiency anemia, unspecified: Secondary | ICD-10-CM | POA: Diagnosis not present

## 2017-02-01 DIAGNOSIS — N186 End stage renal disease: Secondary | ICD-10-CM | POA: Diagnosis not present

## 2017-02-01 LAB — URINE DRUGS OF ABUSE SCREEN W ALC, ROUTINE (REF LAB)
Amphetamines, Urine: NEGATIVE ng/mL
BARBITURATE, UR: NEGATIVE ng/mL
BENZODIAZEPINE QUANT UR: NEGATIVE ng/mL
ETHANOL U, QUAN: NEGATIVE %
Methadone Screen, Urine: NEGATIVE ng/mL
PROPOXYPHENE, URINE: NEGATIVE ng/mL
Phencyclidine, Ur: NEGATIVE ng/mL

## 2017-02-01 LAB — PANEL 799049
CARBOXY THC GC/MS CONF: 43 ng/mL
Cannabinoid GC/MS, Ur: POSITIVE — AB

## 2017-02-01 LAB — OPIATES CONFIRMATION, URINE: OPIATES: NEGATIVE

## 2017-02-01 LAB — COCAINE METABOLITE CONFIRM,UR
BENZOYLECGONINE CONF, UR: 3160 ng/mL
COCAINE METAB QUANT UR: POSITIVE — AB

## 2017-02-04 DIAGNOSIS — N2581 Secondary hyperparathyroidism of renal origin: Secondary | ICD-10-CM | POA: Diagnosis not present

## 2017-02-04 DIAGNOSIS — M3210 Systemic lupus erythematosus, organ or system involvement unspecified: Secondary | ICD-10-CM | POA: Diagnosis not present

## 2017-02-04 DIAGNOSIS — N186 End stage renal disease: Secondary | ICD-10-CM | POA: Diagnosis not present

## 2017-02-04 DIAGNOSIS — D509 Iron deficiency anemia, unspecified: Secondary | ICD-10-CM | POA: Diagnosis not present

## 2017-02-04 DIAGNOSIS — D631 Anemia in chronic kidney disease: Secondary | ICD-10-CM | POA: Diagnosis not present

## 2017-02-05 ENCOUNTER — Other Ambulatory Visit: Payer: Self-pay | Admitting: Nephrology

## 2017-02-05 LAB — OXYCODONES,MS,WB/SP RFX
Oxycocone: NEGATIVE ng/mL
Oxycodones Confirmation: NEGATIVE
Oxymorphone: NEGATIVE ng/mL

## 2017-02-05 LAB — OPIATES,MS,WB/SP RFX
6-ACETYLMORPHINE: NEGATIVE
Codeine: NEGATIVE ng/mL
DIHYDROCODEINE: NEGATIVE ng/mL
HYDROCODONE: 9.6 ng/mL
Hydromorphone: NEGATIVE ng/mL
Morphine: NEGATIVE ng/mL
Opiate Confirmation: POSITIVE

## 2017-02-06 DIAGNOSIS — N186 End stage renal disease: Secondary | ICD-10-CM | POA: Diagnosis not present

## 2017-02-06 DIAGNOSIS — M3210 Systemic lupus erythematosus, organ or system involvement unspecified: Secondary | ICD-10-CM | POA: Diagnosis not present

## 2017-02-06 DIAGNOSIS — D509 Iron deficiency anemia, unspecified: Secondary | ICD-10-CM | POA: Diagnosis not present

## 2017-02-06 DIAGNOSIS — N2581 Secondary hyperparathyroidism of renal origin: Secondary | ICD-10-CM | POA: Diagnosis not present

## 2017-02-06 DIAGNOSIS — D631 Anemia in chronic kidney disease: Secondary | ICD-10-CM | POA: Diagnosis not present

## 2017-02-07 ENCOUNTER — Other Ambulatory Visit: Payer: Self-pay | Admitting: Nephrology

## 2017-02-07 LAB — THC,MS,WB/SP RFX
CANNABIDIOL: NEGATIVE ng/mL
CANNABINOL: NEGATIVE ng/mL
CARBOXY-THC: 34.6 ng/mL
Cannabinoid Confirmation: POSITIVE
HYDROXY-THC: NEGATIVE ng/mL
Tetrahydrocannabinol(THC): 1.2 ng/mL

## 2017-02-08 DIAGNOSIS — M3210 Systemic lupus erythematosus, organ or system involvement unspecified: Secondary | ICD-10-CM | POA: Diagnosis not present

## 2017-02-08 DIAGNOSIS — N186 End stage renal disease: Secondary | ICD-10-CM | POA: Diagnosis not present

## 2017-02-08 DIAGNOSIS — D631 Anemia in chronic kidney disease: Secondary | ICD-10-CM | POA: Diagnosis not present

## 2017-02-08 DIAGNOSIS — N2581 Secondary hyperparathyroidism of renal origin: Secondary | ICD-10-CM | POA: Diagnosis not present

## 2017-02-08 DIAGNOSIS — D509 Iron deficiency anemia, unspecified: Secondary | ICD-10-CM | POA: Diagnosis not present

## 2017-02-11 ENCOUNTER — Other Ambulatory Visit: Payer: Self-pay | Admitting: Nephrology

## 2017-02-11 DIAGNOSIS — D631 Anemia in chronic kidney disease: Secondary | ICD-10-CM | POA: Diagnosis not present

## 2017-02-11 DIAGNOSIS — D509 Iron deficiency anemia, unspecified: Secondary | ICD-10-CM | POA: Diagnosis not present

## 2017-02-11 DIAGNOSIS — N186 End stage renal disease: Secondary | ICD-10-CM | POA: Diagnosis not present

## 2017-02-11 DIAGNOSIS — N2581 Secondary hyperparathyroidism of renal origin: Secondary | ICD-10-CM | POA: Diagnosis not present

## 2017-02-11 DIAGNOSIS — M3210 Systemic lupus erythematosus, organ or system involvement unspecified: Secondary | ICD-10-CM | POA: Diagnosis not present

## 2017-02-11 LAB — DRUG SCREEN 10 W/CONF, SERUM
Amphetamines, IA: NEGATIVE ng/mL
BARBITURATES, IA: NEGATIVE ug/mL
BENZODIAZEPINES, IA: NEGATIVE ng/mL
COCAINE & METABOLITE, IA: POSITIVE ng/mL
Methadone, IA: NEGATIVE ng/mL
OPIATES, IA: POSITIVE ng/mL
Oxycodones, IA: NEGATIVE ng/mL
PHENCYCLIDINE, IA: NEGATIVE ng/mL
Propoxyphene, IA: NEGATIVE ng/mL
THC(MARIJUANA) METABOLITE, IA: POSITIVE ng/mL

## 2017-02-11 LAB — COCAINE,MS,WB/SP RFX
Benzoylecgonine: 310 ng/mL
COCAINE CONFIRMATION: POSITIVE
COCAINE: NEGATIVE ng/mL

## 2017-02-13 DIAGNOSIS — N2581 Secondary hyperparathyroidism of renal origin: Secondary | ICD-10-CM | POA: Diagnosis not present

## 2017-02-13 DIAGNOSIS — N186 End stage renal disease: Secondary | ICD-10-CM | POA: Diagnosis not present

## 2017-02-13 DIAGNOSIS — D509 Iron deficiency anemia, unspecified: Secondary | ICD-10-CM | POA: Diagnosis not present

## 2017-02-13 DIAGNOSIS — D631 Anemia in chronic kidney disease: Secondary | ICD-10-CM | POA: Diagnosis not present

## 2017-02-13 DIAGNOSIS — M3210 Systemic lupus erythematosus, organ or system involvement unspecified: Secondary | ICD-10-CM | POA: Diagnosis not present

## 2017-02-15 DIAGNOSIS — D509 Iron deficiency anemia, unspecified: Secondary | ICD-10-CM | POA: Diagnosis not present

## 2017-02-15 DIAGNOSIS — M3210 Systemic lupus erythematosus, organ or system involvement unspecified: Secondary | ICD-10-CM | POA: Diagnosis not present

## 2017-02-15 DIAGNOSIS — N2581 Secondary hyperparathyroidism of renal origin: Secondary | ICD-10-CM | POA: Diagnosis not present

## 2017-02-15 DIAGNOSIS — D631 Anemia in chronic kidney disease: Secondary | ICD-10-CM | POA: Diagnosis not present

## 2017-02-15 DIAGNOSIS — N186 End stage renal disease: Secondary | ICD-10-CM | POA: Diagnosis not present

## 2017-02-17 DIAGNOSIS — Z992 Dependence on renal dialysis: Secondary | ICD-10-CM | POA: Diagnosis not present

## 2017-02-17 DIAGNOSIS — N186 End stage renal disease: Secondary | ICD-10-CM | POA: Diagnosis not present

## 2017-02-17 DIAGNOSIS — I120 Hypertensive chronic kidney disease with stage 5 chronic kidney disease or end stage renal disease: Secondary | ICD-10-CM | POA: Diagnosis not present

## 2017-02-18 DIAGNOSIS — N2581 Secondary hyperparathyroidism of renal origin: Secondary | ICD-10-CM | POA: Diagnosis not present

## 2017-02-18 DIAGNOSIS — Z23 Encounter for immunization: Secondary | ICD-10-CM | POA: Diagnosis not present

## 2017-02-18 DIAGNOSIS — N186 End stage renal disease: Secondary | ICD-10-CM | POA: Diagnosis not present

## 2017-02-18 DIAGNOSIS — D509 Iron deficiency anemia, unspecified: Secondary | ICD-10-CM | POA: Diagnosis not present

## 2017-02-18 DIAGNOSIS — M3210 Systemic lupus erythematosus, organ or system involvement unspecified: Secondary | ICD-10-CM | POA: Diagnosis not present

## 2017-02-20 DIAGNOSIS — D509 Iron deficiency anemia, unspecified: Secondary | ICD-10-CM | POA: Diagnosis not present

## 2017-02-20 DIAGNOSIS — M3210 Systemic lupus erythematosus, organ or system involvement unspecified: Secondary | ICD-10-CM | POA: Diagnosis not present

## 2017-02-20 DIAGNOSIS — Z23 Encounter for immunization: Secondary | ICD-10-CM | POA: Diagnosis not present

## 2017-02-20 DIAGNOSIS — N186 End stage renal disease: Secondary | ICD-10-CM | POA: Diagnosis not present

## 2017-02-20 DIAGNOSIS — N2581 Secondary hyperparathyroidism of renal origin: Secondary | ICD-10-CM | POA: Diagnosis not present

## 2017-02-22 DIAGNOSIS — N2581 Secondary hyperparathyroidism of renal origin: Secondary | ICD-10-CM | POA: Diagnosis not present

## 2017-02-22 DIAGNOSIS — M3210 Systemic lupus erythematosus, organ or system involvement unspecified: Secondary | ICD-10-CM | POA: Diagnosis not present

## 2017-02-22 DIAGNOSIS — N186 End stage renal disease: Secondary | ICD-10-CM | POA: Diagnosis not present

## 2017-02-22 DIAGNOSIS — Z23 Encounter for immunization: Secondary | ICD-10-CM | POA: Diagnosis not present

## 2017-02-22 DIAGNOSIS — D509 Iron deficiency anemia, unspecified: Secondary | ICD-10-CM | POA: Diagnosis not present

## 2017-02-25 DIAGNOSIS — M3210 Systemic lupus erythematosus, organ or system involvement unspecified: Secondary | ICD-10-CM | POA: Diagnosis not present

## 2017-02-25 DIAGNOSIS — Z23 Encounter for immunization: Secondary | ICD-10-CM | POA: Diagnosis not present

## 2017-02-25 DIAGNOSIS — N2581 Secondary hyperparathyroidism of renal origin: Secondary | ICD-10-CM | POA: Diagnosis not present

## 2017-02-25 DIAGNOSIS — N186 End stage renal disease: Secondary | ICD-10-CM | POA: Diagnosis not present

## 2017-02-25 DIAGNOSIS — D509 Iron deficiency anemia, unspecified: Secondary | ICD-10-CM | POA: Diagnosis not present

## 2017-02-27 DIAGNOSIS — D509 Iron deficiency anemia, unspecified: Secondary | ICD-10-CM | POA: Diagnosis not present

## 2017-02-27 DIAGNOSIS — Z23 Encounter for immunization: Secondary | ICD-10-CM | POA: Diagnosis not present

## 2017-02-27 DIAGNOSIS — N186 End stage renal disease: Secondary | ICD-10-CM | POA: Diagnosis not present

## 2017-02-27 DIAGNOSIS — M3210 Systemic lupus erythematosus, organ or system involvement unspecified: Secondary | ICD-10-CM | POA: Diagnosis not present

## 2017-02-27 DIAGNOSIS — N2581 Secondary hyperparathyroidism of renal origin: Secondary | ICD-10-CM | POA: Diagnosis not present

## 2017-03-01 DIAGNOSIS — N2581 Secondary hyperparathyroidism of renal origin: Secondary | ICD-10-CM | POA: Diagnosis not present

## 2017-03-01 DIAGNOSIS — N186 End stage renal disease: Secondary | ICD-10-CM | POA: Diagnosis not present

## 2017-03-01 DIAGNOSIS — D509 Iron deficiency anemia, unspecified: Secondary | ICD-10-CM | POA: Diagnosis not present

## 2017-03-01 DIAGNOSIS — Z23 Encounter for immunization: Secondary | ICD-10-CM | POA: Diagnosis not present

## 2017-03-01 DIAGNOSIS — M3210 Systemic lupus erythematosus, organ or system involvement unspecified: Secondary | ICD-10-CM | POA: Diagnosis not present

## 2017-03-04 DIAGNOSIS — Z23 Encounter for immunization: Secondary | ICD-10-CM | POA: Diagnosis not present

## 2017-03-04 DIAGNOSIS — N186 End stage renal disease: Secondary | ICD-10-CM | POA: Diagnosis not present

## 2017-03-04 DIAGNOSIS — D509 Iron deficiency anemia, unspecified: Secondary | ICD-10-CM | POA: Diagnosis not present

## 2017-03-04 DIAGNOSIS — N2581 Secondary hyperparathyroidism of renal origin: Secondary | ICD-10-CM | POA: Diagnosis not present

## 2017-03-04 DIAGNOSIS — M3210 Systemic lupus erythematosus, organ or system involvement unspecified: Secondary | ICD-10-CM | POA: Diagnosis not present

## 2017-03-06 DIAGNOSIS — Z23 Encounter for immunization: Secondary | ICD-10-CM | POA: Diagnosis not present

## 2017-03-06 DIAGNOSIS — N186 End stage renal disease: Secondary | ICD-10-CM | POA: Diagnosis not present

## 2017-03-06 DIAGNOSIS — D509 Iron deficiency anemia, unspecified: Secondary | ICD-10-CM | POA: Diagnosis not present

## 2017-03-06 DIAGNOSIS — N2581 Secondary hyperparathyroidism of renal origin: Secondary | ICD-10-CM | POA: Diagnosis not present

## 2017-03-06 DIAGNOSIS — M3210 Systemic lupus erythematosus, organ or system involvement unspecified: Secondary | ICD-10-CM | POA: Diagnosis not present

## 2017-03-08 DIAGNOSIS — D509 Iron deficiency anemia, unspecified: Secondary | ICD-10-CM | POA: Diagnosis not present

## 2017-03-08 DIAGNOSIS — N2581 Secondary hyperparathyroidism of renal origin: Secondary | ICD-10-CM | POA: Diagnosis not present

## 2017-03-08 DIAGNOSIS — N186 End stage renal disease: Secondary | ICD-10-CM | POA: Diagnosis not present

## 2017-03-08 DIAGNOSIS — Z23 Encounter for immunization: Secondary | ICD-10-CM | POA: Diagnosis not present

## 2017-03-08 DIAGNOSIS — M3210 Systemic lupus erythematosus, organ or system involvement unspecified: Secondary | ICD-10-CM | POA: Diagnosis not present

## 2017-03-11 DIAGNOSIS — N186 End stage renal disease: Secondary | ICD-10-CM | POA: Diagnosis not present

## 2017-03-11 DIAGNOSIS — D509 Iron deficiency anemia, unspecified: Secondary | ICD-10-CM | POA: Diagnosis not present

## 2017-03-11 DIAGNOSIS — N2581 Secondary hyperparathyroidism of renal origin: Secondary | ICD-10-CM | POA: Diagnosis not present

## 2017-03-11 DIAGNOSIS — Z23 Encounter for immunization: Secondary | ICD-10-CM | POA: Diagnosis not present

## 2017-03-11 DIAGNOSIS — M3210 Systemic lupus erythematosus, organ or system involvement unspecified: Secondary | ICD-10-CM | POA: Diagnosis not present

## 2017-03-13 DIAGNOSIS — N2581 Secondary hyperparathyroidism of renal origin: Secondary | ICD-10-CM | POA: Diagnosis not present

## 2017-03-13 DIAGNOSIS — N186 End stage renal disease: Secondary | ICD-10-CM | POA: Diagnosis not present

## 2017-03-13 DIAGNOSIS — Z23 Encounter for immunization: Secondary | ICD-10-CM | POA: Diagnosis not present

## 2017-03-13 DIAGNOSIS — D509 Iron deficiency anemia, unspecified: Secondary | ICD-10-CM | POA: Diagnosis not present

## 2017-03-13 DIAGNOSIS — M3210 Systemic lupus erythematosus, organ or system involvement unspecified: Secondary | ICD-10-CM | POA: Diagnosis not present

## 2017-03-15 DIAGNOSIS — N2581 Secondary hyperparathyroidism of renal origin: Secondary | ICD-10-CM | POA: Diagnosis not present

## 2017-03-15 DIAGNOSIS — D509 Iron deficiency anemia, unspecified: Secondary | ICD-10-CM | POA: Diagnosis not present

## 2017-03-15 DIAGNOSIS — N186 End stage renal disease: Secondary | ICD-10-CM | POA: Diagnosis not present

## 2017-03-15 DIAGNOSIS — Z23 Encounter for immunization: Secondary | ICD-10-CM | POA: Diagnosis not present

## 2017-03-15 DIAGNOSIS — M3210 Systemic lupus erythematosus, organ or system involvement unspecified: Secondary | ICD-10-CM | POA: Diagnosis not present

## 2017-03-18 DIAGNOSIS — N2581 Secondary hyperparathyroidism of renal origin: Secondary | ICD-10-CM | POA: Diagnosis not present

## 2017-03-18 DIAGNOSIS — M3210 Systemic lupus erythematosus, organ or system involvement unspecified: Secondary | ICD-10-CM | POA: Diagnosis not present

## 2017-03-18 DIAGNOSIS — Z23 Encounter for immunization: Secondary | ICD-10-CM | POA: Diagnosis not present

## 2017-03-18 DIAGNOSIS — D509 Iron deficiency anemia, unspecified: Secondary | ICD-10-CM | POA: Diagnosis not present

## 2017-03-18 DIAGNOSIS — N186 End stage renal disease: Secondary | ICD-10-CM | POA: Diagnosis not present

## 2017-03-20 DIAGNOSIS — D509 Iron deficiency anemia, unspecified: Secondary | ICD-10-CM | POA: Diagnosis not present

## 2017-03-20 DIAGNOSIS — Z23 Encounter for immunization: Secondary | ICD-10-CM | POA: Diagnosis not present

## 2017-03-20 DIAGNOSIS — M3210 Systemic lupus erythematosus, organ or system involvement unspecified: Secondary | ICD-10-CM | POA: Diagnosis not present

## 2017-03-20 DIAGNOSIS — N2581 Secondary hyperparathyroidism of renal origin: Secondary | ICD-10-CM | POA: Diagnosis not present

## 2017-03-20 DIAGNOSIS — Z992 Dependence on renal dialysis: Secondary | ICD-10-CM | POA: Diagnosis not present

## 2017-03-20 DIAGNOSIS — I120 Hypertensive chronic kidney disease with stage 5 chronic kidney disease or end stage renal disease: Secondary | ICD-10-CM | POA: Diagnosis not present

## 2017-03-20 DIAGNOSIS — N186 End stage renal disease: Secondary | ICD-10-CM | POA: Diagnosis not present

## 2017-03-22 DIAGNOSIS — N186 End stage renal disease: Secondary | ICD-10-CM | POA: Diagnosis not present

## 2017-03-22 DIAGNOSIS — D631 Anemia in chronic kidney disease: Secondary | ICD-10-CM | POA: Diagnosis not present

## 2017-03-22 DIAGNOSIS — D509 Iron deficiency anemia, unspecified: Secondary | ICD-10-CM | POA: Diagnosis not present

## 2017-03-22 DIAGNOSIS — M3210 Systemic lupus erythematosus, organ or system involvement unspecified: Secondary | ICD-10-CM | POA: Diagnosis not present

## 2017-03-22 DIAGNOSIS — N2581 Secondary hyperparathyroidism of renal origin: Secondary | ICD-10-CM | POA: Diagnosis not present

## 2017-03-25 DIAGNOSIS — N186 End stage renal disease: Secondary | ICD-10-CM | POA: Diagnosis not present

## 2017-03-25 DIAGNOSIS — M3210 Systemic lupus erythematosus, organ or system involvement unspecified: Secondary | ICD-10-CM | POA: Diagnosis not present

## 2017-03-25 DIAGNOSIS — D509 Iron deficiency anemia, unspecified: Secondary | ICD-10-CM | POA: Diagnosis not present

## 2017-03-25 DIAGNOSIS — D631 Anemia in chronic kidney disease: Secondary | ICD-10-CM | POA: Diagnosis not present

## 2017-03-25 DIAGNOSIS — N2581 Secondary hyperparathyroidism of renal origin: Secondary | ICD-10-CM | POA: Diagnosis not present

## 2017-03-27 DIAGNOSIS — D509 Iron deficiency anemia, unspecified: Secondary | ICD-10-CM | POA: Diagnosis not present

## 2017-03-27 DIAGNOSIS — N2581 Secondary hyperparathyroidism of renal origin: Secondary | ICD-10-CM | POA: Diagnosis not present

## 2017-03-27 DIAGNOSIS — N186 End stage renal disease: Secondary | ICD-10-CM | POA: Diagnosis not present

## 2017-03-27 DIAGNOSIS — D631 Anemia in chronic kidney disease: Secondary | ICD-10-CM | POA: Diagnosis not present

## 2017-03-27 DIAGNOSIS — M3210 Systemic lupus erythematosus, organ or system involvement unspecified: Secondary | ICD-10-CM | POA: Diagnosis not present

## 2017-03-29 DIAGNOSIS — M3210 Systemic lupus erythematosus, organ or system involvement unspecified: Secondary | ICD-10-CM | POA: Diagnosis not present

## 2017-03-29 DIAGNOSIS — N186 End stage renal disease: Secondary | ICD-10-CM | POA: Diagnosis not present

## 2017-03-29 DIAGNOSIS — N2581 Secondary hyperparathyroidism of renal origin: Secondary | ICD-10-CM | POA: Diagnosis not present

## 2017-03-29 DIAGNOSIS — D631 Anemia in chronic kidney disease: Secondary | ICD-10-CM | POA: Diagnosis not present

## 2017-03-29 DIAGNOSIS — D509 Iron deficiency anemia, unspecified: Secondary | ICD-10-CM | POA: Diagnosis not present

## 2017-04-01 DIAGNOSIS — N186 End stage renal disease: Secondary | ICD-10-CM | POA: Diagnosis not present

## 2017-04-01 DIAGNOSIS — D631 Anemia in chronic kidney disease: Secondary | ICD-10-CM | POA: Diagnosis not present

## 2017-04-01 DIAGNOSIS — N2581 Secondary hyperparathyroidism of renal origin: Secondary | ICD-10-CM | POA: Diagnosis not present

## 2017-04-01 DIAGNOSIS — M3210 Systemic lupus erythematosus, organ or system involvement unspecified: Secondary | ICD-10-CM | POA: Diagnosis not present

## 2017-04-01 DIAGNOSIS — D509 Iron deficiency anemia, unspecified: Secondary | ICD-10-CM | POA: Diagnosis not present

## 2017-04-03 DIAGNOSIS — M3210 Systemic lupus erythematosus, organ or system involvement unspecified: Secondary | ICD-10-CM | POA: Diagnosis not present

## 2017-04-03 DIAGNOSIS — D631 Anemia in chronic kidney disease: Secondary | ICD-10-CM | POA: Diagnosis not present

## 2017-04-03 DIAGNOSIS — D509 Iron deficiency anemia, unspecified: Secondary | ICD-10-CM | POA: Diagnosis not present

## 2017-04-03 DIAGNOSIS — N186 End stage renal disease: Secondary | ICD-10-CM | POA: Diagnosis not present

## 2017-04-03 DIAGNOSIS — N2581 Secondary hyperparathyroidism of renal origin: Secondary | ICD-10-CM | POA: Diagnosis not present

## 2017-04-05 DIAGNOSIS — N186 End stage renal disease: Secondary | ICD-10-CM | POA: Diagnosis not present

## 2017-04-05 DIAGNOSIS — D631 Anemia in chronic kidney disease: Secondary | ICD-10-CM | POA: Diagnosis not present

## 2017-04-05 DIAGNOSIS — D509 Iron deficiency anemia, unspecified: Secondary | ICD-10-CM | POA: Diagnosis not present

## 2017-04-05 DIAGNOSIS — N2581 Secondary hyperparathyroidism of renal origin: Secondary | ICD-10-CM | POA: Diagnosis not present

## 2017-04-05 DIAGNOSIS — M3210 Systemic lupus erythematosus, organ or system involvement unspecified: Secondary | ICD-10-CM | POA: Diagnosis not present

## 2017-04-07 DIAGNOSIS — N186 End stage renal disease: Secondary | ICD-10-CM | POA: Diagnosis not present

## 2017-04-07 DIAGNOSIS — D509 Iron deficiency anemia, unspecified: Secondary | ICD-10-CM | POA: Diagnosis not present

## 2017-04-07 DIAGNOSIS — N2581 Secondary hyperparathyroidism of renal origin: Secondary | ICD-10-CM | POA: Diagnosis not present

## 2017-04-07 DIAGNOSIS — D631 Anemia in chronic kidney disease: Secondary | ICD-10-CM | POA: Diagnosis not present

## 2017-04-07 DIAGNOSIS — M3210 Systemic lupus erythematosus, organ or system involvement unspecified: Secondary | ICD-10-CM | POA: Diagnosis not present

## 2017-04-09 DIAGNOSIS — D509 Iron deficiency anemia, unspecified: Secondary | ICD-10-CM | POA: Diagnosis not present

## 2017-04-09 DIAGNOSIS — N2581 Secondary hyperparathyroidism of renal origin: Secondary | ICD-10-CM | POA: Diagnosis not present

## 2017-04-09 DIAGNOSIS — D631 Anemia in chronic kidney disease: Secondary | ICD-10-CM | POA: Diagnosis not present

## 2017-04-09 DIAGNOSIS — M3210 Systemic lupus erythematosus, organ or system involvement unspecified: Secondary | ICD-10-CM | POA: Diagnosis not present

## 2017-04-09 DIAGNOSIS — N186 End stage renal disease: Secondary | ICD-10-CM | POA: Diagnosis not present

## 2017-04-12 DIAGNOSIS — M3210 Systemic lupus erythematosus, organ or system involvement unspecified: Secondary | ICD-10-CM | POA: Diagnosis not present

## 2017-04-12 DIAGNOSIS — N186 End stage renal disease: Secondary | ICD-10-CM | POA: Diagnosis not present

## 2017-04-12 DIAGNOSIS — D631 Anemia in chronic kidney disease: Secondary | ICD-10-CM | POA: Diagnosis not present

## 2017-04-12 DIAGNOSIS — D509 Iron deficiency anemia, unspecified: Secondary | ICD-10-CM | POA: Diagnosis not present

## 2017-04-12 DIAGNOSIS — N2581 Secondary hyperparathyroidism of renal origin: Secondary | ICD-10-CM | POA: Diagnosis not present

## 2017-04-15 DIAGNOSIS — D631 Anemia in chronic kidney disease: Secondary | ICD-10-CM | POA: Diagnosis not present

## 2017-04-15 DIAGNOSIS — D509 Iron deficiency anemia, unspecified: Secondary | ICD-10-CM | POA: Diagnosis not present

## 2017-04-15 DIAGNOSIS — M3210 Systemic lupus erythematosus, organ or system involvement unspecified: Secondary | ICD-10-CM | POA: Diagnosis not present

## 2017-04-15 DIAGNOSIS — N186 End stage renal disease: Secondary | ICD-10-CM | POA: Diagnosis not present

## 2017-04-15 DIAGNOSIS — N2581 Secondary hyperparathyroidism of renal origin: Secondary | ICD-10-CM | POA: Diagnosis not present

## 2017-04-17 DIAGNOSIS — D631 Anemia in chronic kidney disease: Secondary | ICD-10-CM | POA: Diagnosis not present

## 2017-04-17 DIAGNOSIS — M3210 Systemic lupus erythematosus, organ or system involvement unspecified: Secondary | ICD-10-CM | POA: Diagnosis not present

## 2017-04-17 DIAGNOSIS — N2581 Secondary hyperparathyroidism of renal origin: Secondary | ICD-10-CM | POA: Diagnosis not present

## 2017-04-17 DIAGNOSIS — D509 Iron deficiency anemia, unspecified: Secondary | ICD-10-CM | POA: Diagnosis not present

## 2017-04-17 DIAGNOSIS — N186 End stage renal disease: Secondary | ICD-10-CM | POA: Diagnosis not present

## 2017-04-19 DIAGNOSIS — D509 Iron deficiency anemia, unspecified: Secondary | ICD-10-CM | POA: Diagnosis not present

## 2017-04-19 DIAGNOSIS — N2581 Secondary hyperparathyroidism of renal origin: Secondary | ICD-10-CM | POA: Diagnosis not present

## 2017-04-19 DIAGNOSIS — N186 End stage renal disease: Secondary | ICD-10-CM | POA: Diagnosis not present

## 2017-04-19 DIAGNOSIS — Z992 Dependence on renal dialysis: Secondary | ICD-10-CM | POA: Diagnosis not present

## 2017-04-19 DIAGNOSIS — M3210 Systemic lupus erythematosus, organ or system involvement unspecified: Secondary | ICD-10-CM | POA: Diagnosis not present

## 2017-04-19 DIAGNOSIS — D631 Anemia in chronic kidney disease: Secondary | ICD-10-CM | POA: Diagnosis not present

## 2017-04-19 DIAGNOSIS — I120 Hypertensive chronic kidney disease with stage 5 chronic kidney disease or end stage renal disease: Secondary | ICD-10-CM | POA: Diagnosis not present

## 2017-04-22 DIAGNOSIS — N186 End stage renal disease: Secondary | ICD-10-CM | POA: Diagnosis not present

## 2017-04-22 DIAGNOSIS — N2581 Secondary hyperparathyroidism of renal origin: Secondary | ICD-10-CM | POA: Diagnosis not present

## 2017-04-22 DIAGNOSIS — D631 Anemia in chronic kidney disease: Secondary | ICD-10-CM | POA: Diagnosis not present

## 2017-04-22 DIAGNOSIS — D509 Iron deficiency anemia, unspecified: Secondary | ICD-10-CM | POA: Diagnosis not present

## 2017-04-22 DIAGNOSIS — M3210 Systemic lupus erythematosus, organ or system involvement unspecified: Secondary | ICD-10-CM | POA: Diagnosis not present

## 2017-04-24 DIAGNOSIS — N2581 Secondary hyperparathyroidism of renal origin: Secondary | ICD-10-CM | POA: Diagnosis not present

## 2017-04-24 DIAGNOSIS — D509 Iron deficiency anemia, unspecified: Secondary | ICD-10-CM | POA: Diagnosis not present

## 2017-04-24 DIAGNOSIS — N186 End stage renal disease: Secondary | ICD-10-CM | POA: Diagnosis not present

## 2017-04-24 DIAGNOSIS — D631 Anemia in chronic kidney disease: Secondary | ICD-10-CM | POA: Diagnosis not present

## 2017-04-24 DIAGNOSIS — M3210 Systemic lupus erythematosus, organ or system involvement unspecified: Secondary | ICD-10-CM | POA: Diagnosis not present

## 2017-04-26 DIAGNOSIS — D509 Iron deficiency anemia, unspecified: Secondary | ICD-10-CM | POA: Diagnosis not present

## 2017-04-26 DIAGNOSIS — M3210 Systemic lupus erythematosus, organ or system involvement unspecified: Secondary | ICD-10-CM | POA: Diagnosis not present

## 2017-04-26 DIAGNOSIS — N186 End stage renal disease: Secondary | ICD-10-CM | POA: Diagnosis not present

## 2017-04-26 DIAGNOSIS — D631 Anemia in chronic kidney disease: Secondary | ICD-10-CM | POA: Diagnosis not present

## 2017-04-26 DIAGNOSIS — N2581 Secondary hyperparathyroidism of renal origin: Secondary | ICD-10-CM | POA: Diagnosis not present

## 2017-04-29 DIAGNOSIS — N186 End stage renal disease: Secondary | ICD-10-CM | POA: Diagnosis not present

## 2017-04-29 DIAGNOSIS — D631 Anemia in chronic kidney disease: Secondary | ICD-10-CM | POA: Diagnosis not present

## 2017-04-29 DIAGNOSIS — M3210 Systemic lupus erythematosus, organ or system involvement unspecified: Secondary | ICD-10-CM | POA: Diagnosis not present

## 2017-04-29 DIAGNOSIS — N2581 Secondary hyperparathyroidism of renal origin: Secondary | ICD-10-CM | POA: Diagnosis not present

## 2017-04-29 DIAGNOSIS — D509 Iron deficiency anemia, unspecified: Secondary | ICD-10-CM | POA: Diagnosis not present

## 2017-05-01 DIAGNOSIS — N186 End stage renal disease: Secondary | ICD-10-CM | POA: Diagnosis not present

## 2017-05-01 DIAGNOSIS — N2581 Secondary hyperparathyroidism of renal origin: Secondary | ICD-10-CM | POA: Diagnosis not present

## 2017-05-01 DIAGNOSIS — D509 Iron deficiency anemia, unspecified: Secondary | ICD-10-CM | POA: Diagnosis not present

## 2017-05-01 DIAGNOSIS — D631 Anemia in chronic kidney disease: Secondary | ICD-10-CM | POA: Diagnosis not present

## 2017-05-01 DIAGNOSIS — M3210 Systemic lupus erythematosus, organ or system involvement unspecified: Secondary | ICD-10-CM | POA: Diagnosis not present

## 2017-05-03 DIAGNOSIS — D509 Iron deficiency anemia, unspecified: Secondary | ICD-10-CM | POA: Diagnosis not present

## 2017-05-03 DIAGNOSIS — N186 End stage renal disease: Secondary | ICD-10-CM | POA: Diagnosis not present

## 2017-05-03 DIAGNOSIS — M3210 Systemic lupus erythematosus, organ or system involvement unspecified: Secondary | ICD-10-CM | POA: Diagnosis not present

## 2017-05-03 DIAGNOSIS — N2581 Secondary hyperparathyroidism of renal origin: Secondary | ICD-10-CM | POA: Diagnosis not present

## 2017-05-03 DIAGNOSIS — D631 Anemia in chronic kidney disease: Secondary | ICD-10-CM | POA: Diagnosis not present

## 2017-05-06 DIAGNOSIS — N2581 Secondary hyperparathyroidism of renal origin: Secondary | ICD-10-CM | POA: Diagnosis not present

## 2017-05-06 DIAGNOSIS — D509 Iron deficiency anemia, unspecified: Secondary | ICD-10-CM | POA: Diagnosis not present

## 2017-05-06 DIAGNOSIS — D631 Anemia in chronic kidney disease: Secondary | ICD-10-CM | POA: Diagnosis not present

## 2017-05-06 DIAGNOSIS — N186 End stage renal disease: Secondary | ICD-10-CM | POA: Diagnosis not present

## 2017-05-06 DIAGNOSIS — M3210 Systemic lupus erythematosus, organ or system involvement unspecified: Secondary | ICD-10-CM | POA: Diagnosis not present

## 2017-05-08 DIAGNOSIS — M3210 Systemic lupus erythematosus, organ or system involvement unspecified: Secondary | ICD-10-CM | POA: Diagnosis not present

## 2017-05-08 DIAGNOSIS — N2581 Secondary hyperparathyroidism of renal origin: Secondary | ICD-10-CM | POA: Diagnosis not present

## 2017-05-08 DIAGNOSIS — N186 End stage renal disease: Secondary | ICD-10-CM | POA: Diagnosis not present

## 2017-05-08 DIAGNOSIS — D631 Anemia in chronic kidney disease: Secondary | ICD-10-CM | POA: Diagnosis not present

## 2017-05-08 DIAGNOSIS — D509 Iron deficiency anemia, unspecified: Secondary | ICD-10-CM | POA: Diagnosis not present

## 2017-05-10 DIAGNOSIS — D631 Anemia in chronic kidney disease: Secondary | ICD-10-CM | POA: Diagnosis not present

## 2017-05-10 DIAGNOSIS — N186 End stage renal disease: Secondary | ICD-10-CM | POA: Diagnosis not present

## 2017-05-10 DIAGNOSIS — D509 Iron deficiency anemia, unspecified: Secondary | ICD-10-CM | POA: Diagnosis not present

## 2017-05-10 DIAGNOSIS — N2581 Secondary hyperparathyroidism of renal origin: Secondary | ICD-10-CM | POA: Diagnosis not present

## 2017-05-10 DIAGNOSIS — M3210 Systemic lupus erythematosus, organ or system involvement unspecified: Secondary | ICD-10-CM | POA: Diagnosis not present

## 2017-05-12 DIAGNOSIS — N186 End stage renal disease: Secondary | ICD-10-CM | POA: Diagnosis not present

## 2017-05-12 DIAGNOSIS — M3210 Systemic lupus erythematosus, organ or system involvement unspecified: Secondary | ICD-10-CM | POA: Diagnosis not present

## 2017-05-12 DIAGNOSIS — D631 Anemia in chronic kidney disease: Secondary | ICD-10-CM | POA: Diagnosis not present

## 2017-05-12 DIAGNOSIS — N2581 Secondary hyperparathyroidism of renal origin: Secondary | ICD-10-CM | POA: Diagnosis not present

## 2017-05-12 DIAGNOSIS — D509 Iron deficiency anemia, unspecified: Secondary | ICD-10-CM | POA: Diagnosis not present

## 2017-05-15 ENCOUNTER — Emergency Department (HOSPITAL_COMMUNITY)
Admission: EM | Admit: 2017-05-15 | Discharge: 2017-05-15 | Disposition: A | Discharge status: Home / Self Care | Payer: Medicare Other | Attending: Emergency Medicine | Admitting: Emergency Medicine

## 2017-05-15 ENCOUNTER — Encounter (HOSPITAL_COMMUNITY): Payer: Self-pay | Admitting: Emergency Medicine

## 2017-05-15 ENCOUNTER — Emergency Department (HOSPITAL_COMMUNITY): Payer: Medicare Other

## 2017-05-15 ENCOUNTER — Other Ambulatory Visit: Payer: Self-pay

## 2017-05-15 DIAGNOSIS — R110 Nausea: Secondary | ICD-10-CM | POA: Insufficient documentation

## 2017-05-15 DIAGNOSIS — R1907 Generalized intra-abdominal and pelvic swelling, mass and lump: Secondary | ICD-10-CM | POA: Diagnosis not present

## 2017-05-15 DIAGNOSIS — N186 End stage renal disease: Secondary | ICD-10-CM | POA: Diagnosis not present

## 2017-05-15 DIAGNOSIS — K5900 Constipation, unspecified: Secondary | ICD-10-CM | POA: Diagnosis not present

## 2017-05-15 DIAGNOSIS — D509 Iron deficiency anemia, unspecified: Secondary | ICD-10-CM | POA: Diagnosis not present

## 2017-05-15 DIAGNOSIS — I5032 Chronic diastolic (congestive) heart failure: Secondary | ICD-10-CM | POA: Insufficient documentation

## 2017-05-15 DIAGNOSIS — Z7982 Long term (current) use of aspirin: Secondary | ICD-10-CM | POA: Insufficient documentation

## 2017-05-15 DIAGNOSIS — D631 Anemia in chronic kidney disease: Secondary | ICD-10-CM | POA: Diagnosis not present

## 2017-05-15 DIAGNOSIS — Z79899 Other long term (current) drug therapy: Secondary | ICD-10-CM | POA: Diagnosis not present

## 2017-05-15 DIAGNOSIS — I132 Hypertensive heart and chronic kidney disease with heart failure and with stage 5 chronic kidney disease, or end stage renal disease: Secondary | ICD-10-CM | POA: Diagnosis not present

## 2017-05-15 DIAGNOSIS — R1033 Periumbilical pain: Secondary | ICD-10-CM | POA: Diagnosis present

## 2017-05-15 DIAGNOSIS — R222 Localized swelling, mass and lump, trunk: Secondary | ICD-10-CM | POA: Diagnosis not present

## 2017-05-15 DIAGNOSIS — M3210 Systemic lupus erythematosus, organ or system involvement unspecified: Secondary | ICD-10-CM | POA: Diagnosis not present

## 2017-05-15 DIAGNOSIS — F17210 Nicotine dependence, cigarettes, uncomplicated: Secondary | ICD-10-CM | POA: Insufficient documentation

## 2017-05-15 DIAGNOSIS — N2581 Secondary hyperparathyroidism of renal origin: Secondary | ICD-10-CM | POA: Diagnosis not present

## 2017-05-15 LAB — CBC WITH DIFFERENTIAL/PLATELET
Basophils Absolute: 0 10*3/uL (ref 0.0–0.1)
Basophils Relative: 1 %
Eosinophils Absolute: 0.1 10*3/uL (ref 0.0–0.7)
Eosinophils Relative: 1 %
HCT: 38.3 % (ref 36.0–46.0)
Hemoglobin: 12.8 g/dL (ref 12.0–15.0)
Lymphocytes Relative: 28 %
Lymphs Abs: 1.2 10*3/uL (ref 0.7–4.0)
MCH: 32.1 pg (ref 26.0–34.0)
MCHC: 33.4 g/dL (ref 30.0–36.0)
MCV: 96 fL (ref 78.0–100.0)
Monocytes Absolute: 0.3 10*3/uL (ref 0.1–1.0)
Monocytes Relative: 7 %
Neutro Abs: 2.8 10*3/uL (ref 1.7–7.7)
Neutrophils Relative %: 63 %
Platelets: 169 10*3/uL (ref 150–400)
RBC: 3.99 MIL/uL (ref 3.87–5.11)
RDW: 14.7 % (ref 11.5–15.5)
WBC: 4.4 10*3/uL (ref 4.0–10.5)

## 2017-05-15 LAB — COMPREHENSIVE METABOLIC PANEL
ALT: 24 U/L (ref 14–54)
AST: 30 U/L (ref 15–41)
Albumin: 3.9 g/dL (ref 3.5–5.0)
Alkaline Phosphatase: 101 U/L (ref 38–126)
Anion gap: 16 — ABNORMAL HIGH (ref 5–15)
BUN: 26 mg/dL — ABNORMAL HIGH (ref 6–20)
CO2: 30 mmol/L (ref 22–32)
Calcium: 9.7 mg/dL (ref 8.9–10.3)
Chloride: 88 mmol/L — ABNORMAL LOW (ref 101–111)
Creatinine, Ser: 5.35 mg/dL — ABNORMAL HIGH (ref 0.44–1.00)
GFR calc Af Amer: 9 mL/min — ABNORMAL LOW (ref >60)
GFR calc non Af Amer: 8 mL/min — ABNORMAL LOW (ref >60)
Glucose, Bld: 96 mg/dL (ref 65–99)
Potassium: 4.4 mmol/L (ref 3.5–5.1)
Sodium: 134 mmol/L — ABNORMAL LOW (ref 135–145)
Total Bilirubin: 0.7 mg/dL (ref 0.3–1.2)
Total Protein: 8.6 g/dL — ABNORMAL HIGH (ref 6.5–8.1)

## 2017-05-15 LAB — LIPASE, BLOOD: Lipase: 66 U/L — ABNORMAL HIGH (ref 11–51)

## 2017-05-15 MED ORDER — PSYLLIUM 0.52 G PO CAPS: 0.5200 g | ORAL_CAPSULE | Freq: Every day | ORAL | 0 refills | Status: AC

## 2017-05-15 MED ORDER — IOPAMIDOL (ISOVUE-300) INJECTION 61%
INTRAVENOUS | Status: AC
Start: 2017-05-15 — End: 2017-05-15
  Administered 2017-05-15: 100 mL
  Filled 2017-05-15: qty 100

## 2017-05-15 MED ORDER — ONDANSETRON HCL 4 MG/2ML IJ SOLN
4.0000 mg | Freq: Once | INTRAMUSCULAR | Status: AC
  Administered 2017-05-15: 4 mg via INTRAVENOUS
  Filled 2017-05-15: qty 2

## 2017-05-15 MED ORDER — HYDROCODONE-ACETAMINOPHEN 5-325 MG PO TABS: 1.0000 | ORAL_TABLET | Freq: Four times a day (QID) | ORAL | 0 refills | Status: DC | PRN

## 2017-05-15 MED ORDER — ONDANSETRON HCL 4 MG PO TABS: 4.0000 mg | ORAL_TABLET | Freq: Four times a day (QID) | ORAL | 0 refills | Status: DC

## 2017-05-15 MED ORDER — HYDROMORPHONE HCL 1 MG/ML IJ SOLN
0.5000 mg | Freq: Once | INTRAMUSCULAR | Status: AC
  Administered 2017-05-15: 0.5 mg via INTRAMUSCULAR
  Filled 2017-05-15: qty 1

## 2017-05-15 NOTE — ED Provider Notes (Signed)
[facility] EMERGENCY DEPARTMENT Provider Note   CSN: [id number] Arrival date & time: 05/15/17  1411     History   Chief Complaint Chief Complaint  Patient presents with  . lump under belly button    HPI [name] is a 56 y.o. female with history of ESRD on dialysis Monday Wednesday Friday, GERD, lupus, HTN, aortic and mitral stenosis, PUD, chronic diastolic CHF and prior CVA presents with chief complaint acute onset, progressively worsening periumbilical abdominal pain.  She states that approximately 1 week ago she noted a small "lump "just inferior to her umbilicus that was mildly tender.  She states that since then, tenderness has worsened.  Pain worsens with palpation and when clothing rubs against this area.  She also endorses 1 week of constipation and has needed to take a laxative but states that her bowel movements have not been normal for the past week.  Denies melena, hematochezia, dysuria, or hematuria.  She still produces some urine.  She denies fevers or chills.  Denies chest pain or shortness of breath.  She has not tried anything else for her symptoms.  She denies any recent trauma.  Endorses nausea but no vomiting.  The history is provided by the patient.    Past Medical History:  Diagnosis Date  . Anemia   . Aortic stenosis 09/25/2016   Echo 07/24/16: Mod conc LVH, EF 60-65, no RWMA, Gr 2 DDd, bicuspid aortic valve, mild to mod AS (mean 18, peak 38), MAC, mod mitral stenosis (mean 9, peak 19), mild to mod MR, severe LAE, normal RVSF, mild RAE, mild TR  . Blood transfusion '08   [facility]  . Chronic diastolic CHF (congestive heart failure) ([place])   . Dysfunctional uterine bleeding 12/19/2010  . ESRD (end stage renal disease) ([place])    dialysis   Mon Wed Fri  . GERD (gastroesophageal reflux disease)   . Hemodialysis patient [facility])    right extremity port  . Hx of cardiovascular stress test    Lexiscan Myoview 4/16:  Normal stress nuclear study, EF 59%  .  Hx of hiatal hernia   . Hypertension   . Lupus   . Mitral stenosis    Echo 4/16:  EF 55-60%, no RWMA, Gr 1 DD, mod MS (mean 9 mmHg), mod LAE, mild RAE, PASP 65, mod to severe TR, trivial eff  . Peptic ulcer disease   . Stroke [facility])    per patient "they said i had a small stroke but i couldnt even tell"    Patient Active Problem List   Diagnosis Date Noted  . Fluid overload 01/26/2017  . HTN (hypertension) 01/26/2017  . Rotator cuff tear arthropathy, right 10/09/2016  . Aortic stenosis 09/25/2016  . Leukocytosis   . Colitis 05/01/2015  . History of sepsis 05/01/2015  . Hypoxia 05/01/2015  . Mitral stenosis 08/30/2014  . End-stage renal disease on hemodialysis ([place]) 07/25/2014  . History of pulmonary edema 07/25/2014  . Lupus (systemic lupus erythematosus) ([place]) 07/25/2014  . DUB (dysfunctional uterine bleeding) 07/25/2014  . Anemia 07/25/2014  . Peptic ulcer disease 07/25/2014  . History of stroke 07/25/2014  . Hypertensive heart disease with CHF (congestive heart failure) ([place]) 12/19/2010  . Dialysis care 12/19/2010    Past Surgical History:  Procedure Laterality Date  . DIALYSIS FISTULA CREATION  2007  . ENDOMETRIAL ABLATION    . ESOPHAGOGASTRODUODENOSCOPY N/A 07/31/2014   Procedure: ESOPHAGOGASTRODUODENOSCOPY (EGD);  Surgeon: Clarene Essex, MD;  Location: [facility] ENDOSCOPY;  Service: Endoscopy;  Laterality: N/A;  . SHOULDER OPEN ROTATOR CUFF REPAIR Right 10/09/2016   Procedure: ROTATOR CUFF REPAIR SHOULDER OPEN partial acrominectomy and extensive synovectomy;  Surgeon: Latanya [name], MD;  Location: [facility] ORS;  Service: Orthopedics;  Laterality: Right;  RNFA    OB History    No data available       Home Medications    Prior to Admission medications   Medication Sig Start Date End Date Taking? Authorizing Provider  amLODipine (NORVASC) 10 MG tablet Take 10 mg by mouth at bedtime.    Yes [provider]  aspirin EC 81 MG tablet Take 81 mg by mouth at bedtime.   Yes  [provider]  B Complex-C-Folic Acid (RENA-VITE RX) 1 MG TABS Take 1 mg by mouth daily. 11/01/16  Yes [provider]  cloNIDine (CATAPRES) 0.2 MG tablet Take 1 tablet (0.2 mg total) by mouth 2 (two) times daily. 09/25/16  Yes Weaver, Scott T, PA-C  labetalol (NORMODYNE) 300 MG tablet Take 600 mg by mouth 3 (three) times daily.    Yes [provider]  lanthanum (FOSRENOL) 1000 MG chewable tablet Chew 2 tablets (2,000 mg total) by mouth 3 (three) times daily with meals. Patient taking differently: Chew 1,000 mg by mouth 3 (three) times daily with meals.  05/05/15  Yes Eugenie Filler, MD  lisinopril (PRINIVIL,ZESTRIL) 40 MG tablet Take 1 tablet (40 mg total) by mouth 2 (two) times daily. 01/30/17  Yes Aline August, MD  pantoprazole (PROTONIX) 40 MG tablet Take 1 tablet (40 mg total) by mouth 2 (two) times daily. Patient taking differently: Take 40 mg by mouth daily.  08/01/14  Yes Domenic Polite, MD  benzonatate (TESSALON) 100 MG capsule Take 1 capsule (100 mg total) by mouth 3 (three) times daily as needed for cough. Patient not taking: Reported on 05/15/2017 01/30/17   Aline August, MD  HYDROcodone-acetaminophen (NORCO/VICODIN) 5-325 MG tablet Take 1 tablet by mouth every 6 (six) hours as needed for severe pain. 05/15/17   Nils Flack, Chivonne Rascon A, PA-C  ondansetron (ZOFRAN) 4 MG tablet Take 1 tablet (4 mg total) by mouth every 6 (six) hours. 05/15/17   Nils Flack, Lavere Stork A, PA-C  psyllium (METAMUCIL) 0.52 g capsule Take 1 capsule (0.52 g total) by mouth daily for 10 days. 05/15/17 05/25/17  Renita Papa, PA-C    Family History Family History  Problem Relation Age of Onset  . Liver cancer Maternal Grandmother   . Lymphoma Maternal Aunt   . Heart attack Neg Hx     Social History Social History   Tobacco Use  . Smoking status: Current Some Day Smoker    Packs/day: 0.25    Types: Cigarettes  . Smokeless tobacco: Never Used  Substance Use Topics  . Alcohol use: Yes     Alcohol/week: 0.0 oz    Comment: occasional  . Drug use: Yes    Types: Marijuana    Comment: occasionally     Allergies   Patient has no known allergies.   Review of Systems Review of Systems  Constitutional: Negative for chills and fever.  Gastrointestinal: Positive for abdominal pain, constipation and nausea. Negative for blood in stool, diarrhea, rectal pain and vomiting.  Genitourinary: Negative for dysuria and hematuria.  All other systems reviewed and are negative.    Physical Exam Updated Vital Signs BP (!) 149/91   Pulse 75   Temp 98.9 F (37.2 C) (Oral)   Resp 18   LMP 12/05/2010 (LMP Unknown)   SpO2 99%  Physical Exam  Constitutional: She appears well-developed and well-nourished. No distress.  HENT:  Head: Normocephalic and atraumatic.  Eyes: Conjunctivae are normal. Right eye exhibits no discharge. Left eye exhibits no discharge.  Neck: No JVD present. No tracheal deviation present.  Cardiovascular: Normal rate and regular rhythm.  Murmur heard. Whooshing holosystolic murmur noted, dialysis fistula in the right forearm with palpable thrill  Pulmonary/Chest: Effort normal and breath sounds normal.  Abdominal: Soft. Bowel sounds are normal. She exhibits mass. She exhibits no distension. There is tenderness.  Point tenderness overlying 1cm palpable mass to the abdominal wall just inferior to the umbilicus.  No erythema or fluctuance noted.  Murphy sign absent, Rovsing's absent, no McBurney's point tenderness, no CVA tenderness  Musculoskeletal: Normal range of motion. She exhibits no edema or tenderness.  Neurological: She is alert.  Skin: Skin is warm and dry. No erythema.  Psychiatric: She has a normal mood and affect. Her behavior is normal.  Nursing note and vitals reviewed.    ED Treatments / Results  Labs (all labs ordered are listed, but only abnormal results are displayed) Labs Reviewed  COMPREHENSIVE METABOLIC PANEL - Abnormal; Notable for the  following components:      Result Value   Sodium 134 (*)    Chloride 88 (*)    BUN 26 (*)    Creatinine, Ser 5.35 (*)    Total Protein 8.6 (*)    GFR calc non Af Amer 8 (*)    GFR calc Af Amer 9 (*)    Anion gap 16 (*)    All other components within normal limits  LIPASE, BLOOD - Abnormal; Notable for the following components:   Lipase 66 (*)    All other components within normal limits  CBC WITH DIFFERENTIAL/PLATELET    EKG  EKG Interpretation None       Radiology Ct Abdomen Pelvis W Contrast  Addendum Date: 05/15/2017   ADDENDUM REPORT: 05/15/2017 21:11 ADDENDUM: The emergency room physician tells me that patient complains of a new knot at the umbilicus which is not reducible. After further review, there is an ill-defined masslike structure within the superficial soft tissues at the umbilicus, with associated inflammation/fluid stranding, also with associated ring enhancement which is best seen on sagittal reconstruction (series 7, image 96) (also seen on axial series 3, image 43), overall measuring approximately 1.8 cm greatest dimension. There is no connection to the underlying small bowel identified. The anterior stomach wall approximates this finding, therefore, focal herniation of the anterior stomach wall cannot be confidently excluded. More likely, I favor herniated mesenteric fat and vessels with associated infarction and/or inflammation of the herniated fat, possibly associated phlegmon. These results were discussed by telephone on 05/15/2017 at 9:10 pm with physician assistant [facility] Tobby Fawcett , who verbally acknowledged these results. Electronically Signed   By: Franki Cabot M.D.   On: 05/15/2017 21:11   Result Date: 05/15/2017 CLINICAL DATA:  Centralized abdominal pain with constipation and emesis for 4 days. EXAM: CT ABDOMEN AND PELVIS WITH CONTRAST TECHNIQUE: Multidetector CT imaging of the abdomen and pelvis was performed using the standard protocol following bolus  administration of intravenous contrast. CONTRAST:  150m ISOVUE-300 IOPAMIDOL (ISOVUE-300) INJECTION 61% COMPARISON:  CT abdomen dated 05/01/2015. FINDINGS: Lower chest: No acute findings. Prominent mitral annulus calcification. Hepatobiliary: Small hepatic cysts. No acute or suspicious finding within the liver. Gallbladder is unremarkable. No CBD dilatation. Pancreas: Unremarkable. No pancreatic ductal dilatation or surrounding inflammatory changes. Spleen: Innumerable calcified granulomata. No acute or  suspicious finding. Adrenals/Urinary Tract: Adrenal glands are unremarkable. Numerous renal cysts bilaterally. No renal stone or hydronephrosis identified. Bladder is unremarkable, partially decompressed. Stomach/Bowel: No dilated large or small bowel loops identified. Fairly extensive diverticulosis throughout the colon. Majority of the walls of the colon appear thickened, most prominently within the left colon, of uncertain chronicity. Appendix is normal. Stomach is unremarkable. Vascular/Lymphatic: Aortic atherosclerosis. Additional prominent atherosclerotic calcifications at the origins of the celiac, superior mesenteric and renal arteries. No acute appearing vascular abnormality. No enlarged lymph nodes seen. Reproductive: Uterus and bilateral adnexa are unremarkable. Other: No free fluid or abscess collection identified. No free intraperitoneal air. Musculoskeletal: No acute or suspicious osseous finding. Mild degenerative change with lumbar spine. IMPRESSION: 1. Thickening of the walls of the majority of the colon, particularly prominent within the left colon, raising the possibility of a diffuse colitis of infectious or inflammatory nature. There is, however, no pericolonic fluid seen to confirm an acute colitis. Alternatively, this thickening could be chronic related to the underlying diverticulosis. Of note, there are no focal inflammatory changes to suggest acute diverticulitis at this time. No bowel  obstruction. 2. Aortic atherosclerosis. 3. Additional chronic/incidental findings detailed above. Electronically Signed: By: Franki Cabot M.D. On: 05/15/2017 20:26    Procedures Procedures (including critical care time)  Medications Ordered in ED Medications  HYDROmorphone (DILAUDID) injection 0.5 mg (0.5 mg Intramuscular Given 05/15/17 1756)  ondansetron (ZOFRAN) injection 4 mg (4 mg Intravenous Given 05/15/17 1932)  iopamidol (ISOVUE-300) 61 % injection (100 mLs  Contrast Given 05/15/17 2002)     Initial Impression / Assessment and Plan / ED Course  I have reviewed the triage vital signs and the nursing notes.  Pertinent labs & imaging results that were available during my care of the patient were reviewed by me and considered in my medical decision making (see chart for details).    Patient presents with acute onset of palpable mass to the abdomen just inferior to the umbilicus for 1 week.  She has associated constipation and nausea.  Examination of the abdomen is benign aside from mild tenderness to palpation overlying this mass.  No evidence of cellulitis or abscess.  Lab work is at patient's baseline and she has no leukocytosis.  Pain managed while in the ED.  Attempted to reduce the mass, thinking it may be a possible hernia but was unable to do so.  Will obtain CT scan of the abdomen for further evaluation.  CT shows chronic appearing colitis.  Patient is having some constipation but no other signs or symptoms consistent with colitis or diverticulitis.  I doubt obstruction, perforation, appendicitis, or other acute surgical abdominal pathology.  She is nontoxic in appearance.  Radiology comments "ill-defined masslike structure within the superficial soft tissues at the umbilicus, with associated inflammation/fluid stranding".  Cannot exclude gastric contents as the component of the mass, however more likely mesenteric fat inflammation.  Spoke with Dr. Kae Heller, with general surgery who  states that patient is stable for discharge home and can follow-up on an outpatient basis for reevaluation of this mass.  I doubt malignancy.  On reevaluation, patient is resting comfortably, tolerating p.o. food and fluids, and repeat abdominal examination is unremarkable.  Will discharge with fiber to assist with her constipation as well as Zofran for her nausea.  Discussed indications for return to the ED.  Patient will follow with general surgery outpatient.  Pt verbalized understanding of and agreement with plan and is safe for discharge home at this time.  She has no complaints prior to discharge.  Final Clinical Impressions(s) / ED Diagnoses   Final diagnoses:  Abdominal wall mass    ED Discharge Orders        Ordered    psyllium (METAMUCIL) 0.52 g capsule  Daily     05/15/17 2125    ondansetron (ZOFRAN) 4 MG tablet  Every 6 hours     05/15/17 2125    HYDROcodone-acetaminophen (NORCO/VICODIN) 5-325 MG tablet  Every 6 hours PRN     05/15/17 2125       Renita Papa, PA-C 05/15/17 2238    Margette Fast, MD 05/15/17 2357

## 2017-05-15 NOTE — ED Notes (Signed)
Patient verbalized understanding of discharge instructions and denies any further needs or questions at this time. VS stable. Patient ambulatory with steady gait.  

## 2017-05-15 NOTE — ED Notes (Signed)
Patient transported to CT 

## 2017-05-15 NOTE — Discharge Instructions (Signed)
Mix fiber packet with 8 ounces of water daily for the next 10 days.  Take Zofran as needed for nausea.  Take Norco for severe pain, you may take [phone] mg of Tylenol every 6 hours as needed.  Do not exceed 4000 mill grams of Tylenol daily.  Apply ice or heat for comfort.  Follow-up with general surgery for reevaluation.  Return to the ED if any concerning signs or symptoms develop such as fevers, persistent nausea and vomiting, or severe pain.

## 2017-05-15 NOTE — ED Notes (Signed)
EDP at bedside  

## 2017-05-15 NOTE — ED Triage Notes (Signed)
Pt states she noted pain to her belly button and went to assess it and noted a knot under her belly button. No drainage or redness. Pt does have a firm palpable nodule under her belly button.

## 2017-05-17 DIAGNOSIS — N2581 Secondary hyperparathyroidism of renal origin: Secondary | ICD-10-CM | POA: Diagnosis not present

## 2017-05-17 DIAGNOSIS — D631 Anemia in chronic kidney disease: Secondary | ICD-10-CM | POA: Diagnosis not present

## 2017-05-17 DIAGNOSIS — N186 End stage renal disease: Secondary | ICD-10-CM | POA: Diagnosis not present

## 2017-05-17 DIAGNOSIS — M3210 Systemic lupus erythematosus, organ or system involvement unspecified: Secondary | ICD-10-CM | POA: Diagnosis not present

## 2017-05-17 DIAGNOSIS — D509 Iron deficiency anemia, unspecified: Secondary | ICD-10-CM | POA: Diagnosis not present

## 2017-05-19 DIAGNOSIS — N2581 Secondary hyperparathyroidism of renal origin: Secondary | ICD-10-CM | POA: Diagnosis not present

## 2017-05-19 DIAGNOSIS — N186 End stage renal disease: Secondary | ICD-10-CM | POA: Diagnosis not present

## 2017-05-19 DIAGNOSIS — D631 Anemia in chronic kidney disease: Secondary | ICD-10-CM | POA: Diagnosis not present

## 2017-05-19 DIAGNOSIS — M3210 Systemic lupus erythematosus, organ or system involvement unspecified: Secondary | ICD-10-CM | POA: Diagnosis not present

## 2017-05-19 DIAGNOSIS — D509 Iron deficiency anemia, unspecified: Secondary | ICD-10-CM | POA: Diagnosis not present

## 2017-05-20 DIAGNOSIS — I120 Hypertensive chronic kidney disease with stage 5 chronic kidney disease or end stage renal disease: Secondary | ICD-10-CM | POA: Diagnosis not present

## 2017-05-20 DIAGNOSIS — Z992 Dependence on renal dialysis: Secondary | ICD-10-CM | POA: Diagnosis not present

## 2017-05-20 DIAGNOSIS — N186 End stage renal disease: Secondary | ICD-10-CM | POA: Diagnosis not present

## 2017-05-22 DIAGNOSIS — M3210 Systemic lupus erythematosus, organ or system involvement unspecified: Secondary | ICD-10-CM | POA: Diagnosis not present

## 2017-05-22 DIAGNOSIS — N2581 Secondary hyperparathyroidism of renal origin: Secondary | ICD-10-CM | POA: Diagnosis not present

## 2017-05-22 DIAGNOSIS — N186 End stage renal disease: Secondary | ICD-10-CM | POA: Diagnosis not present

## 2017-05-22 DIAGNOSIS — D631 Anemia in chronic kidney disease: Secondary | ICD-10-CM | POA: Diagnosis not present

## 2017-05-22 DIAGNOSIS — D509 Iron deficiency anemia, unspecified: Secondary | ICD-10-CM | POA: Diagnosis not present

## 2017-05-24 DIAGNOSIS — D631 Anemia in chronic kidney disease: Secondary | ICD-10-CM | POA: Diagnosis not present

## 2017-05-24 DIAGNOSIS — N2581 Secondary hyperparathyroidism of renal origin: Secondary | ICD-10-CM | POA: Diagnosis not present

## 2017-05-24 DIAGNOSIS — D509 Iron deficiency anemia, unspecified: Secondary | ICD-10-CM | POA: Diagnosis not present

## 2017-05-24 DIAGNOSIS — M3210 Systemic lupus erythematosus, organ or system involvement unspecified: Secondary | ICD-10-CM | POA: Diagnosis not present

## 2017-05-24 DIAGNOSIS — N186 End stage renal disease: Secondary | ICD-10-CM | POA: Diagnosis not present

## 2017-05-27 DIAGNOSIS — N2581 Secondary hyperparathyroidism of renal origin: Secondary | ICD-10-CM | POA: Diagnosis not present

## 2017-05-27 DIAGNOSIS — M3210 Systemic lupus erythematosus, organ or system involvement unspecified: Secondary | ICD-10-CM | POA: Diagnosis not present

## 2017-05-27 DIAGNOSIS — D631 Anemia in chronic kidney disease: Secondary | ICD-10-CM | POA: Diagnosis not present

## 2017-05-27 DIAGNOSIS — D509 Iron deficiency anemia, unspecified: Secondary | ICD-10-CM | POA: Diagnosis not present

## 2017-05-27 DIAGNOSIS — N186 End stage renal disease: Secondary | ICD-10-CM | POA: Diagnosis not present

## 2017-05-28 DIAGNOSIS — K439 Ventral hernia without obstruction or gangrene: Secondary | ICD-10-CM | POA: Diagnosis not present

## 2017-05-29 DIAGNOSIS — M3210 Systemic lupus erythematosus, organ or system involvement unspecified: Secondary | ICD-10-CM | POA: Diagnosis not present

## 2017-05-29 DIAGNOSIS — D509 Iron deficiency anemia, unspecified: Secondary | ICD-10-CM | POA: Diagnosis not present

## 2017-05-29 DIAGNOSIS — D631 Anemia in chronic kidney disease: Secondary | ICD-10-CM | POA: Diagnosis not present

## 2017-05-29 DIAGNOSIS — N2581 Secondary hyperparathyroidism of renal origin: Secondary | ICD-10-CM | POA: Diagnosis not present

## 2017-05-29 DIAGNOSIS — N186 End stage renal disease: Secondary | ICD-10-CM | POA: Diagnosis not present

## 2017-05-31 DIAGNOSIS — D631 Anemia in chronic kidney disease: Secondary | ICD-10-CM | POA: Diagnosis not present

## 2017-05-31 DIAGNOSIS — N186 End stage renal disease: Secondary | ICD-10-CM | POA: Diagnosis not present

## 2017-05-31 DIAGNOSIS — M3210 Systemic lupus erythematosus, organ or system involvement unspecified: Secondary | ICD-10-CM | POA: Diagnosis not present

## 2017-05-31 DIAGNOSIS — N2581 Secondary hyperparathyroidism of renal origin: Secondary | ICD-10-CM | POA: Diagnosis not present

## 2017-05-31 DIAGNOSIS — D509 Iron deficiency anemia, unspecified: Secondary | ICD-10-CM | POA: Diagnosis not present

## 2017-06-03 DIAGNOSIS — D631 Anemia in chronic kidney disease: Secondary | ICD-10-CM | POA: Diagnosis not present

## 2017-06-03 DIAGNOSIS — M3210 Systemic lupus erythematosus, organ or system involvement unspecified: Secondary | ICD-10-CM | POA: Diagnosis not present

## 2017-06-03 DIAGNOSIS — D509 Iron deficiency anemia, unspecified: Secondary | ICD-10-CM | POA: Diagnosis not present

## 2017-06-03 DIAGNOSIS — N186 End stage renal disease: Secondary | ICD-10-CM | POA: Diagnosis not present

## 2017-06-03 DIAGNOSIS — N2581 Secondary hyperparathyroidism of renal origin: Secondary | ICD-10-CM | POA: Diagnosis not present

## 2017-06-05 DIAGNOSIS — D509 Iron deficiency anemia, unspecified: Secondary | ICD-10-CM | POA: Diagnosis not present

## 2017-06-05 DIAGNOSIS — N186 End stage renal disease: Secondary | ICD-10-CM | POA: Diagnosis not present

## 2017-06-05 DIAGNOSIS — M3210 Systemic lupus erythematosus, organ or system involvement unspecified: Secondary | ICD-10-CM | POA: Diagnosis not present

## 2017-06-05 DIAGNOSIS — N2581 Secondary hyperparathyroidism of renal origin: Secondary | ICD-10-CM | POA: Diagnosis not present

## 2017-06-05 DIAGNOSIS — D631 Anemia in chronic kidney disease: Secondary | ICD-10-CM | POA: Diagnosis not present

## 2017-06-07 DIAGNOSIS — D509 Iron deficiency anemia, unspecified: Secondary | ICD-10-CM | POA: Diagnosis not present

## 2017-06-07 DIAGNOSIS — N186 End stage renal disease: Secondary | ICD-10-CM | POA: Diagnosis not present

## 2017-06-07 DIAGNOSIS — M3210 Systemic lupus erythematosus, organ or system involvement unspecified: Secondary | ICD-10-CM | POA: Diagnosis not present

## 2017-06-07 DIAGNOSIS — D631 Anemia in chronic kidney disease: Secondary | ICD-10-CM | POA: Diagnosis not present

## 2017-06-07 DIAGNOSIS — N2581 Secondary hyperparathyroidism of renal origin: Secondary | ICD-10-CM | POA: Diagnosis not present

## 2017-06-10 DIAGNOSIS — M3210 Systemic lupus erythematosus, organ or system involvement unspecified: Secondary | ICD-10-CM | POA: Diagnosis not present

## 2017-06-10 DIAGNOSIS — D631 Anemia in chronic kidney disease: Secondary | ICD-10-CM | POA: Diagnosis not present

## 2017-06-10 DIAGNOSIS — N2581 Secondary hyperparathyroidism of renal origin: Secondary | ICD-10-CM | POA: Diagnosis not present

## 2017-06-10 DIAGNOSIS — D509 Iron deficiency anemia, unspecified: Secondary | ICD-10-CM | POA: Diagnosis not present

## 2017-06-10 DIAGNOSIS — N186 End stage renal disease: Secondary | ICD-10-CM | POA: Diagnosis not present

## 2017-06-12 DIAGNOSIS — N186 End stage renal disease: Secondary | ICD-10-CM | POA: Diagnosis not present

## 2017-06-12 DIAGNOSIS — M3210 Systemic lupus erythematosus, organ or system involvement unspecified: Secondary | ICD-10-CM | POA: Diagnosis not present

## 2017-06-12 DIAGNOSIS — N2581 Secondary hyperparathyroidism of renal origin: Secondary | ICD-10-CM | POA: Diagnosis not present

## 2017-06-12 DIAGNOSIS — D509 Iron deficiency anemia, unspecified: Secondary | ICD-10-CM | POA: Diagnosis not present

## 2017-06-12 DIAGNOSIS — D631 Anemia in chronic kidney disease: Secondary | ICD-10-CM | POA: Diagnosis not present

## 2017-06-14 DIAGNOSIS — M3210 Systemic lupus erythematosus, organ or system involvement unspecified: Secondary | ICD-10-CM | POA: Diagnosis not present

## 2017-06-14 DIAGNOSIS — D631 Anemia in chronic kidney disease: Secondary | ICD-10-CM | POA: Diagnosis not present

## 2017-06-14 DIAGNOSIS — N186 End stage renal disease: Secondary | ICD-10-CM | POA: Diagnosis not present

## 2017-06-14 DIAGNOSIS — N2581 Secondary hyperparathyroidism of renal origin: Secondary | ICD-10-CM | POA: Diagnosis not present

## 2017-06-14 DIAGNOSIS — D509 Iron deficiency anemia, unspecified: Secondary | ICD-10-CM | POA: Diagnosis not present

## 2017-06-17 DIAGNOSIS — D509 Iron deficiency anemia, unspecified: Secondary | ICD-10-CM | POA: Diagnosis not present

## 2017-06-17 DIAGNOSIS — M3210 Systemic lupus erythematosus, organ or system involvement unspecified: Secondary | ICD-10-CM | POA: Diagnosis not present

## 2017-06-17 DIAGNOSIS — N2581 Secondary hyperparathyroidism of renal origin: Secondary | ICD-10-CM | POA: Diagnosis not present

## 2017-06-17 DIAGNOSIS — N186 End stage renal disease: Secondary | ICD-10-CM | POA: Diagnosis not present

## 2017-06-17 DIAGNOSIS — D631 Anemia in chronic kidney disease: Secondary | ICD-10-CM | POA: Diagnosis not present

## 2017-06-19 DIAGNOSIS — N2581 Secondary hyperparathyroidism of renal origin: Secondary | ICD-10-CM | POA: Diagnosis not present

## 2017-06-19 DIAGNOSIS — D509 Iron deficiency anemia, unspecified: Secondary | ICD-10-CM | POA: Diagnosis not present

## 2017-06-19 DIAGNOSIS — D631 Anemia in chronic kidney disease: Secondary | ICD-10-CM | POA: Diagnosis not present

## 2017-06-19 DIAGNOSIS — M3210 Systemic lupus erythematosus, organ or system involvement unspecified: Secondary | ICD-10-CM | POA: Diagnosis not present

## 2017-06-19 DIAGNOSIS — N186 End stage renal disease: Secondary | ICD-10-CM | POA: Diagnosis not present

## 2017-06-20 DIAGNOSIS — N186 End stage renal disease: Secondary | ICD-10-CM | POA: Diagnosis not present

## 2017-06-20 DIAGNOSIS — I120 Hypertensive chronic kidney disease with stage 5 chronic kidney disease or end stage renal disease: Secondary | ICD-10-CM | POA: Diagnosis not present

## 2017-06-20 DIAGNOSIS — Z992 Dependence on renal dialysis: Secondary | ICD-10-CM | POA: Diagnosis not present

## 2017-06-21 DIAGNOSIS — Z992 Dependence on renal dialysis: Secondary | ICD-10-CM | POA: Diagnosis not present

## 2017-06-21 DIAGNOSIS — N186 End stage renal disease: Secondary | ICD-10-CM | POA: Diagnosis not present

## 2017-06-21 DIAGNOSIS — I120 Hypertensive chronic kidney disease with stage 5 chronic kidney disease or end stage renal disease: Secondary | ICD-10-CM | POA: Diagnosis not present

## 2017-06-21 DIAGNOSIS — M3210 Systemic lupus erythematosus, organ or system involvement unspecified: Secondary | ICD-10-CM | POA: Diagnosis not present

## 2017-06-21 DIAGNOSIS — N2581 Secondary hyperparathyroidism of renal origin: Secondary | ICD-10-CM | POA: Diagnosis not present

## 2017-06-21 DIAGNOSIS — D509 Iron deficiency anemia, unspecified: Secondary | ICD-10-CM | POA: Diagnosis not present

## 2017-06-24 DIAGNOSIS — M3210 Systemic lupus erythematosus, organ or system involvement unspecified: Secondary | ICD-10-CM | POA: Diagnosis not present

## 2017-06-24 DIAGNOSIS — N2581 Secondary hyperparathyroidism of renal origin: Secondary | ICD-10-CM | POA: Diagnosis not present

## 2017-06-24 DIAGNOSIS — D509 Iron deficiency anemia, unspecified: Secondary | ICD-10-CM | POA: Diagnosis not present

## 2017-06-24 DIAGNOSIS — N186 End stage renal disease: Secondary | ICD-10-CM | POA: Diagnosis not present

## 2017-06-26 DIAGNOSIS — D509 Iron deficiency anemia, unspecified: Secondary | ICD-10-CM | POA: Diagnosis not present

## 2017-06-26 DIAGNOSIS — N2581 Secondary hyperparathyroidism of renal origin: Secondary | ICD-10-CM | POA: Diagnosis not present

## 2017-06-26 DIAGNOSIS — N186 End stage renal disease: Secondary | ICD-10-CM | POA: Diagnosis not present

## 2017-06-26 DIAGNOSIS — M3210 Systemic lupus erythematosus, organ or system involvement unspecified: Secondary | ICD-10-CM | POA: Diagnosis not present

## 2017-06-28 DIAGNOSIS — M3210 Systemic lupus erythematosus, organ or system involvement unspecified: Secondary | ICD-10-CM | POA: Diagnosis not present

## 2017-06-28 DIAGNOSIS — N186 End stage renal disease: Secondary | ICD-10-CM | POA: Diagnosis not present

## 2017-06-28 DIAGNOSIS — N2581 Secondary hyperparathyroidism of renal origin: Secondary | ICD-10-CM | POA: Diagnosis not present

## 2017-06-28 DIAGNOSIS — D509 Iron deficiency anemia, unspecified: Secondary | ICD-10-CM | POA: Diagnosis not present

## 2017-07-01 DIAGNOSIS — D509 Iron deficiency anemia, unspecified: Secondary | ICD-10-CM | POA: Diagnosis not present

## 2017-07-01 DIAGNOSIS — N2581 Secondary hyperparathyroidism of renal origin: Secondary | ICD-10-CM | POA: Diagnosis not present

## 2017-07-01 DIAGNOSIS — N186 End stage renal disease: Secondary | ICD-10-CM | POA: Diagnosis not present

## 2017-07-01 DIAGNOSIS — M3210 Systemic lupus erythematosus, organ or system involvement unspecified: Secondary | ICD-10-CM | POA: Diagnosis not present

## 2017-07-03 DIAGNOSIS — N186 End stage renal disease: Secondary | ICD-10-CM | POA: Diagnosis not present

## 2017-07-03 DIAGNOSIS — N2581 Secondary hyperparathyroidism of renal origin: Secondary | ICD-10-CM | POA: Diagnosis not present

## 2017-07-03 DIAGNOSIS — D509 Iron deficiency anemia, unspecified: Secondary | ICD-10-CM | POA: Diagnosis not present

## 2017-07-03 DIAGNOSIS — M3210 Systemic lupus erythematosus, organ or system involvement unspecified: Secondary | ICD-10-CM | POA: Diagnosis not present

## 2017-07-05 DIAGNOSIS — D509 Iron deficiency anemia, unspecified: Secondary | ICD-10-CM | POA: Diagnosis not present

## 2017-07-05 DIAGNOSIS — N2581 Secondary hyperparathyroidism of renal origin: Secondary | ICD-10-CM | POA: Diagnosis not present

## 2017-07-05 DIAGNOSIS — M3210 Systemic lupus erythematosus, organ or system involvement unspecified: Secondary | ICD-10-CM | POA: Diagnosis not present

## 2017-07-05 DIAGNOSIS — N186 End stage renal disease: Secondary | ICD-10-CM | POA: Diagnosis not present

## 2017-07-08 DIAGNOSIS — N2581 Secondary hyperparathyroidism of renal origin: Secondary | ICD-10-CM | POA: Diagnosis not present

## 2017-07-08 DIAGNOSIS — M3210 Systemic lupus erythematosus, organ or system involvement unspecified: Secondary | ICD-10-CM | POA: Diagnosis not present

## 2017-07-08 DIAGNOSIS — D509 Iron deficiency anemia, unspecified: Secondary | ICD-10-CM | POA: Diagnosis not present

## 2017-07-08 DIAGNOSIS — N186 End stage renal disease: Secondary | ICD-10-CM | POA: Diagnosis not present

## 2017-07-10 DIAGNOSIS — D509 Iron deficiency anemia, unspecified: Secondary | ICD-10-CM | POA: Diagnosis not present

## 2017-07-10 DIAGNOSIS — M3210 Systemic lupus erythematosus, organ or system involvement unspecified: Secondary | ICD-10-CM | POA: Diagnosis not present

## 2017-07-10 DIAGNOSIS — N186 End stage renal disease: Secondary | ICD-10-CM | POA: Diagnosis not present

## 2017-07-10 DIAGNOSIS — N2581 Secondary hyperparathyroidism of renal origin: Secondary | ICD-10-CM | POA: Diagnosis not present

## 2017-07-12 DIAGNOSIS — M3210 Systemic lupus erythematosus, organ or system involvement unspecified: Secondary | ICD-10-CM | POA: Diagnosis not present

## 2017-07-12 DIAGNOSIS — N186 End stage renal disease: Secondary | ICD-10-CM | POA: Diagnosis not present

## 2017-07-12 DIAGNOSIS — N2581 Secondary hyperparathyroidism of renal origin: Secondary | ICD-10-CM | POA: Diagnosis not present

## 2017-07-12 DIAGNOSIS — D509 Iron deficiency anemia, unspecified: Secondary | ICD-10-CM | POA: Diagnosis not present

## 2017-07-15 DIAGNOSIS — N2581 Secondary hyperparathyroidism of renal origin: Secondary | ICD-10-CM | POA: Diagnosis not present

## 2017-07-15 DIAGNOSIS — N186 End stage renal disease: Secondary | ICD-10-CM | POA: Diagnosis not present

## 2017-07-15 DIAGNOSIS — M3210 Systemic lupus erythematosus, organ or system involvement unspecified: Secondary | ICD-10-CM | POA: Diagnosis not present

## 2017-07-15 DIAGNOSIS — D509 Iron deficiency anemia, unspecified: Secondary | ICD-10-CM | POA: Diagnosis not present

## 2017-07-17 DIAGNOSIS — N186 End stage renal disease: Secondary | ICD-10-CM | POA: Diagnosis not present

## 2017-07-17 DIAGNOSIS — N2581 Secondary hyperparathyroidism of renal origin: Secondary | ICD-10-CM | POA: Diagnosis not present

## 2017-07-17 DIAGNOSIS — M3210 Systemic lupus erythematosus, organ or system involvement unspecified: Secondary | ICD-10-CM | POA: Diagnosis not present

## 2017-07-17 DIAGNOSIS — D509 Iron deficiency anemia, unspecified: Secondary | ICD-10-CM | POA: Diagnosis not present

## 2017-07-19 DIAGNOSIS — D631 Anemia in chronic kidney disease: Secondary | ICD-10-CM | POA: Diagnosis not present

## 2017-07-19 DIAGNOSIS — Z992 Dependence on renal dialysis: Secondary | ICD-10-CM | POA: Diagnosis not present

## 2017-07-19 DIAGNOSIS — D509 Iron deficiency anemia, unspecified: Secondary | ICD-10-CM | POA: Diagnosis not present

## 2017-07-19 DIAGNOSIS — I120 Hypertensive chronic kidney disease with stage 5 chronic kidney disease or end stage renal disease: Secondary | ICD-10-CM | POA: Diagnosis not present

## 2017-07-19 DIAGNOSIS — N2581 Secondary hyperparathyroidism of renal origin: Secondary | ICD-10-CM | POA: Diagnosis not present

## 2017-07-19 DIAGNOSIS — M3210 Systemic lupus erythematosus, organ or system involvement unspecified: Secondary | ICD-10-CM | POA: Diagnosis not present

## 2017-07-19 DIAGNOSIS — N186 End stage renal disease: Secondary | ICD-10-CM | POA: Diagnosis not present

## 2017-07-22 DIAGNOSIS — D631 Anemia in chronic kidney disease: Secondary | ICD-10-CM | POA: Diagnosis not present

## 2017-07-22 DIAGNOSIS — N186 End stage renal disease: Secondary | ICD-10-CM | POA: Diagnosis not present

## 2017-07-22 DIAGNOSIS — D509 Iron deficiency anemia, unspecified: Secondary | ICD-10-CM | POA: Diagnosis not present

## 2017-07-22 DIAGNOSIS — M3210 Systemic lupus erythematosus, organ or system involvement unspecified: Secondary | ICD-10-CM | POA: Diagnosis not present

## 2017-07-22 DIAGNOSIS — N2581 Secondary hyperparathyroidism of renal origin: Secondary | ICD-10-CM | POA: Diagnosis not present

## 2017-07-24 DIAGNOSIS — N186 End stage renal disease: Secondary | ICD-10-CM | POA: Diagnosis not present

## 2017-07-24 DIAGNOSIS — D509 Iron deficiency anemia, unspecified: Secondary | ICD-10-CM | POA: Diagnosis not present

## 2017-07-24 DIAGNOSIS — N2581 Secondary hyperparathyroidism of renal origin: Secondary | ICD-10-CM | POA: Diagnosis not present

## 2017-07-24 DIAGNOSIS — M3210 Systemic lupus erythematosus, organ or system involvement unspecified: Secondary | ICD-10-CM | POA: Diagnosis not present

## 2017-07-24 DIAGNOSIS — D631 Anemia in chronic kidney disease: Secondary | ICD-10-CM | POA: Diagnosis not present

## 2017-07-26 DIAGNOSIS — N2581 Secondary hyperparathyroidism of renal origin: Secondary | ICD-10-CM | POA: Diagnosis not present

## 2017-07-26 DIAGNOSIS — N186 End stage renal disease: Secondary | ICD-10-CM | POA: Diagnosis not present

## 2017-07-26 DIAGNOSIS — D509 Iron deficiency anemia, unspecified: Secondary | ICD-10-CM | POA: Diagnosis not present

## 2017-07-26 DIAGNOSIS — M3210 Systemic lupus erythematosus, organ or system involvement unspecified: Secondary | ICD-10-CM | POA: Diagnosis not present

## 2017-07-26 DIAGNOSIS — D631 Anemia in chronic kidney disease: Secondary | ICD-10-CM | POA: Diagnosis not present

## 2017-07-29 DIAGNOSIS — N186 End stage renal disease: Secondary | ICD-10-CM | POA: Diagnosis not present

## 2017-07-29 DIAGNOSIS — N2581 Secondary hyperparathyroidism of renal origin: Secondary | ICD-10-CM | POA: Diagnosis not present

## 2017-07-29 DIAGNOSIS — M3210 Systemic lupus erythematosus, organ or system involvement unspecified: Secondary | ICD-10-CM | POA: Diagnosis not present

## 2017-07-29 DIAGNOSIS — D509 Iron deficiency anemia, unspecified: Secondary | ICD-10-CM | POA: Diagnosis not present

## 2017-07-29 DIAGNOSIS — D631 Anemia in chronic kidney disease: Secondary | ICD-10-CM | POA: Diagnosis not present

## 2017-07-31 DIAGNOSIS — D631 Anemia in chronic kidney disease: Secondary | ICD-10-CM | POA: Diagnosis not present

## 2017-07-31 DIAGNOSIS — D509 Iron deficiency anemia, unspecified: Secondary | ICD-10-CM | POA: Diagnosis not present

## 2017-07-31 DIAGNOSIS — N2581 Secondary hyperparathyroidism of renal origin: Secondary | ICD-10-CM | POA: Diagnosis not present

## 2017-07-31 DIAGNOSIS — N186 End stage renal disease: Secondary | ICD-10-CM | POA: Diagnosis not present

## 2017-07-31 DIAGNOSIS — M3210 Systemic lupus erythematosus, organ or system involvement unspecified: Secondary | ICD-10-CM | POA: Diagnosis not present

## 2017-08-02 DIAGNOSIS — M3210 Systemic lupus erythematosus, organ or system involvement unspecified: Secondary | ICD-10-CM | POA: Diagnosis not present

## 2017-08-02 DIAGNOSIS — N2581 Secondary hyperparathyroidism of renal origin: Secondary | ICD-10-CM | POA: Diagnosis not present

## 2017-08-02 DIAGNOSIS — N186 End stage renal disease: Secondary | ICD-10-CM | POA: Diagnosis not present

## 2017-08-02 DIAGNOSIS — D509 Iron deficiency anemia, unspecified: Secondary | ICD-10-CM | POA: Diagnosis not present

## 2017-08-02 DIAGNOSIS — D631 Anemia in chronic kidney disease: Secondary | ICD-10-CM | POA: Diagnosis not present

## 2017-08-05 DIAGNOSIS — N2581 Secondary hyperparathyroidism of renal origin: Secondary | ICD-10-CM | POA: Diagnosis not present

## 2017-08-05 DIAGNOSIS — D631 Anemia in chronic kidney disease: Secondary | ICD-10-CM | POA: Diagnosis not present

## 2017-08-05 DIAGNOSIS — N186 End stage renal disease: Secondary | ICD-10-CM | POA: Diagnosis not present

## 2017-08-05 DIAGNOSIS — M3210 Systemic lupus erythematosus, organ or system involvement unspecified: Secondary | ICD-10-CM | POA: Diagnosis not present

## 2017-08-05 DIAGNOSIS — D509 Iron deficiency anemia, unspecified: Secondary | ICD-10-CM | POA: Diagnosis not present

## 2017-08-06 DIAGNOSIS — T82858A Stenosis of vascular prosthetic devices, implants and grafts, initial encounter: Secondary | ICD-10-CM | POA: Diagnosis not present

## 2017-08-06 DIAGNOSIS — I871 Compression of vein: Secondary | ICD-10-CM | POA: Diagnosis not present

## 2017-08-06 DIAGNOSIS — Z992 Dependence on renal dialysis: Secondary | ICD-10-CM | POA: Diagnosis not present

## 2017-08-06 DIAGNOSIS — N186 End stage renal disease: Secondary | ICD-10-CM | POA: Diagnosis not present

## 2017-08-07 DIAGNOSIS — N186 End stage renal disease: Secondary | ICD-10-CM | POA: Diagnosis not present

## 2017-08-07 DIAGNOSIS — M3210 Systemic lupus erythematosus, organ or system involvement unspecified: Secondary | ICD-10-CM | POA: Diagnosis not present

## 2017-08-07 DIAGNOSIS — D631 Anemia in chronic kidney disease: Secondary | ICD-10-CM | POA: Diagnosis not present

## 2017-08-07 DIAGNOSIS — N2581 Secondary hyperparathyroidism of renal origin: Secondary | ICD-10-CM | POA: Diagnosis not present

## 2017-08-07 DIAGNOSIS — D509 Iron deficiency anemia, unspecified: Secondary | ICD-10-CM | POA: Diagnosis not present

## 2017-08-09 DIAGNOSIS — M3210 Systemic lupus erythematosus, organ or system involvement unspecified: Secondary | ICD-10-CM | POA: Diagnosis not present

## 2017-08-09 DIAGNOSIS — D509 Iron deficiency anemia, unspecified: Secondary | ICD-10-CM | POA: Diagnosis not present

## 2017-08-09 DIAGNOSIS — N2581 Secondary hyperparathyroidism of renal origin: Secondary | ICD-10-CM | POA: Diagnosis not present

## 2017-08-09 DIAGNOSIS — D631 Anemia in chronic kidney disease: Secondary | ICD-10-CM | POA: Diagnosis not present

## 2017-08-09 DIAGNOSIS — N186 End stage renal disease: Secondary | ICD-10-CM | POA: Diagnosis not present

## 2017-08-12 DIAGNOSIS — D631 Anemia in chronic kidney disease: Secondary | ICD-10-CM | POA: Diagnosis not present

## 2017-08-12 DIAGNOSIS — N186 End stage renal disease: Secondary | ICD-10-CM | POA: Diagnosis not present

## 2017-08-12 DIAGNOSIS — D509 Iron deficiency anemia, unspecified: Secondary | ICD-10-CM | POA: Diagnosis not present

## 2017-08-12 DIAGNOSIS — N2581 Secondary hyperparathyroidism of renal origin: Secondary | ICD-10-CM | POA: Diagnosis not present

## 2017-08-12 DIAGNOSIS — M3210 Systemic lupus erythematosus, organ or system involvement unspecified: Secondary | ICD-10-CM | POA: Diagnosis not present

## 2017-08-14 DIAGNOSIS — D509 Iron deficiency anemia, unspecified: Secondary | ICD-10-CM | POA: Diagnosis not present

## 2017-08-14 DIAGNOSIS — D631 Anemia in chronic kidney disease: Secondary | ICD-10-CM | POA: Diagnosis not present

## 2017-08-14 DIAGNOSIS — N2581 Secondary hyperparathyroidism of renal origin: Secondary | ICD-10-CM | POA: Diagnosis not present

## 2017-08-14 DIAGNOSIS — N186 End stage renal disease: Secondary | ICD-10-CM | POA: Diagnosis not present

## 2017-08-14 DIAGNOSIS — M3210 Systemic lupus erythematosus, organ or system involvement unspecified: Secondary | ICD-10-CM | POA: Diagnosis not present

## 2017-08-16 DIAGNOSIS — D509 Iron deficiency anemia, unspecified: Secondary | ICD-10-CM | POA: Diagnosis not present

## 2017-08-16 DIAGNOSIS — N186 End stage renal disease: Secondary | ICD-10-CM | POA: Diagnosis not present

## 2017-08-16 DIAGNOSIS — M3210 Systemic lupus erythematosus, organ or system involvement unspecified: Secondary | ICD-10-CM | POA: Diagnosis not present

## 2017-08-16 DIAGNOSIS — N2581 Secondary hyperparathyroidism of renal origin: Secondary | ICD-10-CM | POA: Diagnosis not present

## 2017-08-16 DIAGNOSIS — D631 Anemia in chronic kidney disease: Secondary | ICD-10-CM | POA: Diagnosis not present

## 2017-08-19 DIAGNOSIS — D631 Anemia in chronic kidney disease: Secondary | ICD-10-CM | POA: Diagnosis not present

## 2017-08-19 DIAGNOSIS — M3210 Systemic lupus erythematosus, organ or system involvement unspecified: Secondary | ICD-10-CM | POA: Diagnosis not present

## 2017-08-19 DIAGNOSIS — N186 End stage renal disease: Secondary | ICD-10-CM | POA: Diagnosis not present

## 2017-08-19 DIAGNOSIS — I120 Hypertensive chronic kidney disease with stage 5 chronic kidney disease or end stage renal disease: Secondary | ICD-10-CM | POA: Diagnosis not present

## 2017-08-19 DIAGNOSIS — D509 Iron deficiency anemia, unspecified: Secondary | ICD-10-CM | POA: Diagnosis not present

## 2017-08-19 DIAGNOSIS — N2581 Secondary hyperparathyroidism of renal origin: Secondary | ICD-10-CM | POA: Diagnosis not present

## 2017-08-19 DIAGNOSIS — Z992 Dependence on renal dialysis: Secondary | ICD-10-CM | POA: Diagnosis not present

## 2017-08-21 DIAGNOSIS — D631 Anemia in chronic kidney disease: Secondary | ICD-10-CM | POA: Diagnosis not present

## 2017-08-21 DIAGNOSIS — N2581 Secondary hyperparathyroidism of renal origin: Secondary | ICD-10-CM | POA: Diagnosis not present

## 2017-08-21 DIAGNOSIS — M3210 Systemic lupus erythematosus, organ or system involvement unspecified: Secondary | ICD-10-CM | POA: Diagnosis not present

## 2017-08-21 DIAGNOSIS — D509 Iron deficiency anemia, unspecified: Secondary | ICD-10-CM | POA: Diagnosis not present

## 2017-08-21 DIAGNOSIS — N186 End stage renal disease: Secondary | ICD-10-CM | POA: Diagnosis not present

## 2017-08-23 DIAGNOSIS — M3210 Systemic lupus erythematosus, organ or system involvement unspecified: Secondary | ICD-10-CM | POA: Diagnosis not present

## 2017-08-23 DIAGNOSIS — N186 End stage renal disease: Secondary | ICD-10-CM | POA: Diagnosis not present

## 2017-08-23 DIAGNOSIS — N2581 Secondary hyperparathyroidism of renal origin: Secondary | ICD-10-CM | POA: Diagnosis not present

## 2017-08-23 DIAGNOSIS — D509 Iron deficiency anemia, unspecified: Secondary | ICD-10-CM | POA: Diagnosis not present

## 2017-08-23 DIAGNOSIS — D631 Anemia in chronic kidney disease: Secondary | ICD-10-CM | POA: Diagnosis not present

## 2017-08-26 DIAGNOSIS — D509 Iron deficiency anemia, unspecified: Secondary | ICD-10-CM | POA: Diagnosis not present

## 2017-08-26 DIAGNOSIS — M3210 Systemic lupus erythematosus, organ or system involvement unspecified: Secondary | ICD-10-CM | POA: Diagnosis not present

## 2017-08-26 DIAGNOSIS — N186 End stage renal disease: Secondary | ICD-10-CM | POA: Diagnosis not present

## 2017-08-26 DIAGNOSIS — N2581 Secondary hyperparathyroidism of renal origin: Secondary | ICD-10-CM | POA: Diagnosis not present

## 2017-08-26 DIAGNOSIS — D631 Anemia in chronic kidney disease: Secondary | ICD-10-CM | POA: Diagnosis not present

## 2017-08-28 DIAGNOSIS — D509 Iron deficiency anemia, unspecified: Secondary | ICD-10-CM | POA: Diagnosis not present

## 2017-08-28 DIAGNOSIS — N186 End stage renal disease: Secondary | ICD-10-CM | POA: Diagnosis not present

## 2017-08-28 DIAGNOSIS — M3210 Systemic lupus erythematosus, organ or system involvement unspecified: Secondary | ICD-10-CM | POA: Diagnosis not present

## 2017-08-28 DIAGNOSIS — D631 Anemia in chronic kidney disease: Secondary | ICD-10-CM | POA: Diagnosis not present

## 2017-08-28 DIAGNOSIS — N2581 Secondary hyperparathyroidism of renal origin: Secondary | ICD-10-CM | POA: Diagnosis not present

## 2017-08-30 DIAGNOSIS — N2581 Secondary hyperparathyroidism of renal origin: Secondary | ICD-10-CM | POA: Diagnosis not present

## 2017-08-30 DIAGNOSIS — M3210 Systemic lupus erythematosus, organ or system involvement unspecified: Secondary | ICD-10-CM | POA: Diagnosis not present

## 2017-08-30 DIAGNOSIS — N186 End stage renal disease: Secondary | ICD-10-CM | POA: Diagnosis not present

## 2017-08-30 DIAGNOSIS — D631 Anemia in chronic kidney disease: Secondary | ICD-10-CM | POA: Diagnosis not present

## 2017-08-30 DIAGNOSIS — D509 Iron deficiency anemia, unspecified: Secondary | ICD-10-CM | POA: Diagnosis not present

## 2017-09-02 DIAGNOSIS — M3210 Systemic lupus erythematosus, organ or system involvement unspecified: Secondary | ICD-10-CM | POA: Diagnosis not present

## 2017-09-02 DIAGNOSIS — N186 End stage renal disease: Secondary | ICD-10-CM | POA: Diagnosis not present

## 2017-09-02 DIAGNOSIS — N2581 Secondary hyperparathyroidism of renal origin: Secondary | ICD-10-CM | POA: Diagnosis not present

## 2017-09-02 DIAGNOSIS — D631 Anemia in chronic kidney disease: Secondary | ICD-10-CM | POA: Diagnosis not present

## 2017-09-02 DIAGNOSIS — D509 Iron deficiency anemia, unspecified: Secondary | ICD-10-CM | POA: Diagnosis not present

## 2017-09-04 DIAGNOSIS — D509 Iron deficiency anemia, unspecified: Secondary | ICD-10-CM | POA: Diagnosis not present

## 2017-09-04 DIAGNOSIS — N186 End stage renal disease: Secondary | ICD-10-CM | POA: Diagnosis not present

## 2017-09-04 DIAGNOSIS — M3210 Systemic lupus erythematosus, organ or system involvement unspecified: Secondary | ICD-10-CM | POA: Diagnosis not present

## 2017-09-04 DIAGNOSIS — D631 Anemia in chronic kidney disease: Secondary | ICD-10-CM | POA: Diagnosis not present

## 2017-09-04 DIAGNOSIS — N2581 Secondary hyperparathyroidism of renal origin: Secondary | ICD-10-CM | POA: Diagnosis not present

## 2017-09-06 DIAGNOSIS — N186 End stage renal disease: Secondary | ICD-10-CM | POA: Diagnosis not present

## 2017-09-06 DIAGNOSIS — D509 Iron deficiency anemia, unspecified: Secondary | ICD-10-CM | POA: Diagnosis not present

## 2017-09-06 DIAGNOSIS — D631 Anemia in chronic kidney disease: Secondary | ICD-10-CM | POA: Diagnosis not present

## 2017-09-06 DIAGNOSIS — M3210 Systemic lupus erythematosus, organ or system involvement unspecified: Secondary | ICD-10-CM | POA: Diagnosis not present

## 2017-09-06 DIAGNOSIS — N2581 Secondary hyperparathyroidism of renal origin: Secondary | ICD-10-CM | POA: Diagnosis not present

## 2017-09-09 DIAGNOSIS — D509 Iron deficiency anemia, unspecified: Secondary | ICD-10-CM | POA: Diagnosis not present

## 2017-09-09 DIAGNOSIS — N186 End stage renal disease: Secondary | ICD-10-CM | POA: Diagnosis not present

## 2017-09-09 DIAGNOSIS — N2581 Secondary hyperparathyroidism of renal origin: Secondary | ICD-10-CM | POA: Diagnosis not present

## 2017-09-09 DIAGNOSIS — D631 Anemia in chronic kidney disease: Secondary | ICD-10-CM | POA: Diagnosis not present

## 2017-09-09 DIAGNOSIS — M3210 Systemic lupus erythematosus, organ or system involvement unspecified: Secondary | ICD-10-CM | POA: Diagnosis not present

## 2017-09-11 DIAGNOSIS — D509 Iron deficiency anemia, unspecified: Secondary | ICD-10-CM | POA: Diagnosis not present

## 2017-09-11 DIAGNOSIS — D631 Anemia in chronic kidney disease: Secondary | ICD-10-CM | POA: Diagnosis not present

## 2017-09-11 DIAGNOSIS — N186 End stage renal disease: Secondary | ICD-10-CM | POA: Diagnosis not present

## 2017-09-11 DIAGNOSIS — M3210 Systemic lupus erythematosus, organ or system involvement unspecified: Secondary | ICD-10-CM | POA: Diagnosis not present

## 2017-09-11 DIAGNOSIS — N2581 Secondary hyperparathyroidism of renal origin: Secondary | ICD-10-CM | POA: Diagnosis not present

## 2017-09-13 DIAGNOSIS — N2581 Secondary hyperparathyroidism of renal origin: Secondary | ICD-10-CM | POA: Diagnosis not present

## 2017-09-13 DIAGNOSIS — D631 Anemia in chronic kidney disease: Secondary | ICD-10-CM | POA: Diagnosis not present

## 2017-09-13 DIAGNOSIS — N186 End stage renal disease: Secondary | ICD-10-CM | POA: Diagnosis not present

## 2017-09-13 DIAGNOSIS — M3210 Systemic lupus erythematosus, organ or system involvement unspecified: Secondary | ICD-10-CM | POA: Diagnosis not present

## 2017-09-13 DIAGNOSIS — D509 Iron deficiency anemia, unspecified: Secondary | ICD-10-CM | POA: Diagnosis not present

## 2017-09-16 DIAGNOSIS — D509 Iron deficiency anemia, unspecified: Secondary | ICD-10-CM | POA: Diagnosis not present

## 2017-09-16 DIAGNOSIS — N186 End stage renal disease: Secondary | ICD-10-CM | POA: Diagnosis not present

## 2017-09-16 DIAGNOSIS — N2581 Secondary hyperparathyroidism of renal origin: Secondary | ICD-10-CM | POA: Diagnosis not present

## 2017-09-16 DIAGNOSIS — D631 Anemia in chronic kidney disease: Secondary | ICD-10-CM | POA: Diagnosis not present

## 2017-09-16 DIAGNOSIS — M3210 Systemic lupus erythematosus, organ or system involvement unspecified: Secondary | ICD-10-CM | POA: Diagnosis not present

## 2017-09-18 DIAGNOSIS — N186 End stage renal disease: Secondary | ICD-10-CM | POA: Diagnosis not present

## 2017-09-18 DIAGNOSIS — D509 Iron deficiency anemia, unspecified: Secondary | ICD-10-CM | POA: Diagnosis not present

## 2017-09-18 DIAGNOSIS — Z992 Dependence on renal dialysis: Secondary | ICD-10-CM | POA: Diagnosis not present

## 2017-09-18 DIAGNOSIS — M3210 Systemic lupus erythematosus, organ or system involvement unspecified: Secondary | ICD-10-CM | POA: Diagnosis not present

## 2017-09-18 DIAGNOSIS — I120 Hypertensive chronic kidney disease with stage 5 chronic kidney disease or end stage renal disease: Secondary | ICD-10-CM | POA: Diagnosis not present

## 2017-09-18 DIAGNOSIS — D631 Anemia in chronic kidney disease: Secondary | ICD-10-CM | POA: Diagnosis not present

## 2017-09-18 DIAGNOSIS — N2581 Secondary hyperparathyroidism of renal origin: Secondary | ICD-10-CM | POA: Diagnosis not present

## 2017-09-20 ENCOUNTER — Encounter: Payer: Self-pay | Admitting: *Deleted

## 2017-09-20 ENCOUNTER — Ambulatory Visit (INDEPENDENT_AMBULATORY_CARE_PROVIDER_SITE_OTHER): Payer: Medicare Other | Admitting: Vascular Surgery

## 2017-09-20 ENCOUNTER — Other Ambulatory Visit: Payer: Self-pay | Admitting: *Deleted

## 2017-09-20 ENCOUNTER — Other Ambulatory Visit: Payer: Self-pay

## 2017-09-20 ENCOUNTER — Encounter: Payer: Self-pay | Admitting: Vascular Surgery

## 2017-09-20 VITALS — BP 140/86 | HR 89 | Resp 18 | Ht 64.0 in | Wt 132.0 lb

## 2017-09-20 DIAGNOSIS — Z992 Dependence on renal dialysis: Secondary | ICD-10-CM

## 2017-09-20 DIAGNOSIS — N186 End stage renal disease: Secondary | ICD-10-CM

## 2017-09-20 DIAGNOSIS — M3210 Systemic lupus erythematosus, organ or system involvement unspecified: Secondary | ICD-10-CM | POA: Diagnosis not present

## 2017-09-20 DIAGNOSIS — D631 Anemia in chronic kidney disease: Secondary | ICD-10-CM | POA: Diagnosis not present

## 2017-09-20 DIAGNOSIS — D509 Iron deficiency anemia, unspecified: Secondary | ICD-10-CM | POA: Diagnosis not present

## 2017-09-20 DIAGNOSIS — N2581 Secondary hyperparathyroidism of renal origin: Secondary | ICD-10-CM | POA: Diagnosis not present

## 2017-09-20 NOTE — Progress Notes (Signed)
HISTORY AND PHYSICAL     CC:  Stenosis of aneurysmal fistula Requesting Provider:  Dr. Augustin Coupe  HPI: This is a 57 y.o. 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  Her original fistula was created in February 2007 by Dr. Amedeo Plenty.  She is right handed and told back then that the vein in her left arm was too small.  She recently underwent fistulogram by Dr. 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  She was referred for further evaluation.  She states if they stick the distal aneurysmal area, the machine will not run but if they stick the proximal aneurysmal area, the machine runs fine without alarming.    She takes a daily aspirin.  She is on a CCB and ACEI for blood pressure management as well as clonidine.    Past Medical History:  Diagnosis Date  . Anemia   . Aortic stenosis 09/25/2016   Echo 07/24/16: Mod conc LVH, EF 60-65, no RWMA, Gr 2 DDd, bicuspid aortic valve, mild to mod AS (mean 18, peak Connie), MAC, mod mitral stenosis (mean 9, peak 19), mild to mod MR, severe LAE, normal RVSF, mild RAE, mild TR  . Blood transfusion '08   [facility]  . Chronic diastolic CHF (congestive heart failure) ([place])   . Dysfunctional uterine bleeding 12/19/2010  . ESRD (end stage renal disease) ([employer])    dialysis   Mon Wed Fri  . GERD (gastroesophageal reflux disease)   . Hemodialysis patient [facility])    right extremity port  . Hx of cardiovascular stress test    Lexiscan Myoview 4/16:  Normal stress nuclear study, EF 59%  . Hx of hiatal hernia   . Hypertension   . Lupus ([place])   . Mitral stenosis    Echo 4/16:  EF 55-60%, no RWMA, Gr 1 DD, mod MS (mean 9 mmHg), mod LAE, mild RAE, PASP 65, mod to severe TR, trivial eff  . Peptic ulcer disease   . Stroke [facility])    per patient "they said i had a small stroke but i couldnt even tell"    Past Surgical History:  Procedure Laterality Date  . DIALYSIS  FISTULA CREATION  2007  . ENDOMETRIAL ABLATION    . ESOPHAGOGASTRODUODENOSCOPY N/A 07/31/2014   Procedure: ESOPHAGOGASTRODUODENOSCOPY (EGD);  Surgeon: Clarene Essex, MD;  Location: [facility] ENDOSCOPY;  Service: Endoscopy;  Laterality: N/A;  . SHOULDER OPEN ROTATOR CUFF REPAIR Right 10/09/2016   Procedure: ROTATOR CUFF REPAIR SHOULDER OPEN partial acrominectomy and extensive synovectomy;  Surgeon: Latanya [name], MD;  Location: [facility] ORS;  Service: Orthopedics;  Laterality: Right;  RNFA    No Known Allergies  Current Outpatient Medications  Medication Sig Dispense Refill  . amLODipine (NORVASC) 10 MG tablet Take 10 mg by mouth at bedtime.     [name] aspirin EC 81 MG tablet Take 81 mg by mouth at bedtime.    . B Complex-C-Folic Acid (RENA-VITE RX) 1 MG TABS Take 1 mg by mouth daily.  6  . cloNIDine (CATAPRES) 0.2 MG tablet Take 1 tablet (0.2 mg total) by mouth 2 (two) times daily. 180 tablet 3  . labetalol (NORMODYNE) 300 MG tablet Take 600 mg by mouth 3 (three) times daily.     [name] lanthanum (FOSRENOL) 1000 MG chewable tablet Chew 2 tablets (2,000 mg total) by mouth 3 (three) times daily with meals. (Patient taking differently: Sarina Ser  1,000 mg by mouth 3 (three) times daily with meals. ) 180 tablet 0  . lisinopril (PRINIVIL,ZESTRIL) 40 MG tablet Take 1 tablet (40 mg total) by mouth 2 (two) times daily. 60 tablet 0  . ondansetron (ZOFRAN) 4 MG tablet Take 1 tablet (4 mg total) by mouth every 6 (six) hours. 12 tablet 0  . pantoprazole (PROTONIX) 40 MG tablet Take 1 tablet (40 mg total) by mouth 2 (two) times daily. (Patient taking differently: Take 40 mg by mouth daily. ) 60 tablet 0  . benzonatate (TESSALON) 100 MG capsule Take 1 capsule (100 mg total) by mouth 3 (three) times daily as needed for cough. (Patient not taking: Reported on 05/15/2017) 20 capsule 0  . HYDROcodone-acetaminophen (NORCO/VICODIN) 5-325 MG tablet Take 1 tablet by mouth every 6 (six) hours as needed for severe pain. (Patient not taking: Reported  on 09/20/2017) 5 tablet 0   No current facility-administered medications for this visit.     Family History  Problem Relation Age of Onset  . Liver cancer Maternal Grandmother   . Lymphoma Maternal Aunt   . Heart attack Neg Hx     Social History   Socioeconomic History  . Marital status: Single    Spouse name: Not on file  . Number of children: Not on file  . Years of education: Not on file  . Highest education level: Not on file  Occupational History  . Not on file  Social Needs  . Financial resource strain: Not on file  . Food insecurity:    Worry: Not on file    Inability: Not on file  . Transportation needs:    Medical: Not on file    Non-medical: Not on file  Tobacco Use  . Smoking status: Current Some Day Smoker    Packs/day: 0.25    Types: Cigarettes  . Smokeless tobacco: Never Used  Substance and Sexual Activity  . Alcohol use: Yes    Alcohol/week: 0.0 oz    Comment: occasional  . Drug use: Yes    Types: Marijuana    Comment: occasionally  . Sexual activity: Not on file  Lifestyle  . Physical activity:    Days per week: Not on file    Minutes per session: Not on file  . Stress: Not on file  Relationships  . Social connections:    Talks on phone: Not on file    Gets together: Not on file    Attends religious service: Not on file    Active member of club or organization: Not on file    Attends meetings of clubs or organizations: Not on file    Relationship status: Not on file  . Intimate partner violence:    Fear of current or ex partner: Not on file    Emotionally abused: Not on file    Physically abused: Not on file    Forced sexual activity: Not on file  Other Topics Concern  . Not on file  Social History Narrative  . Not on file     REVIEW OF SYSTEMS:   [X]  denotes positive finding, [ ]  denotes negative finding Cardiac  Comments:  Chest pain or chest pressure:    Shortness of breath upon exertion:    Short of breath when lying flat:      Irregular heart rhythm:        Vascular    Pain in calf, thigh, or hip brought on by ambulation:    Pain in feet at night  that wakes you up from your sleep:     Blood clot in your veins:    Leg swelling:         Pulmonary    Oxygen at home:    Productive cough:     Wheezing:         Neurologic    Sudden weakness in arms or legs:     Sudden numbness in arms or legs:     Sudden onset of difficulty speaking or slurred speech:    Temporary loss of vision in one eye:     Problems with dizziness:         Gastrointestinal    Blood in stool:     Vomited blood:         Genitourinary    Burning when urinating:     Blood in urine:        Psychiatric    Major depression:         Hematologic    Bleeding problems:    Problems with blood clotting too easily:        Skin    Rashes or ulcers:        Constitutional    Fever or chills:      PHYSICAL EXAMINATION:  Vitals:   09/20/17 1615  BP: 140/86  Pulse: 89  Resp: 18  SpO2: 98%   Vitals:   09/20/17 1615  Weight: 132 lb (59.9 kg)  Height: 5' 4"  (1.626 m)   Body mass index is 22.66 kg/m.  General:  WDWN in NAD; vital signs documented above Gait: Normal HENT: WNL, normocephalic Pulmonary: normal non-labored breathing , without Rales, rhonchi,  wheezing Cardiac: regular HR Skin: without rashes Vascular Exam/Pulses: There is a palpable right radial pulse Extremities: right arm AVF with palpable thrill at the anastomosis but diminishes more proximally Musculoskeletal: no muscle wasting or atrophy  Neurologic: A&O X 3;  No focal weakness or paresthesias are detected Psychiatric:  The pt has Normal affect.   Non-Invasive Vascular Imaging:   None today  Pt meds includes: Statin:  No. Beta Blocker:  No. Aspirin:  Yes.   ACEI:  Yes.   ARB:  No. CCB use:  Yes Other Antiplatelet/Anticoagulant:  No   ASSESSMENT/PLAN:: 57 y.o. [name] with ESRD on dialysis M/W/F who presents in referral from Dr. Augustin Coupe for 90%  inflow stenosis b/w aneurysms   -Dr. Donzetta Matters feels that the fistula needs to be revised by excising the distal aneurysm and replacing with Artegraft.  They can continue to stick the proximal portion of the fistula and she will not need a catheter.  They will not be able to stick the new portion of the graft for 4 weeks. -the fistula was created in 2007, if this fistula were to thrombose, she most likely would need a left arm fistula and they vein upon physical exam looks to be adequate.  She is right handed.    Leontine Locket, PA-C [facility] [phone]  Clinic MD:  Pt seen and examined with Dr. Donzetta Matters    I have interviewed and examined patient with PA and agree with assessment and plan above.  Tight stenosis on recent fistulogram and access with multiple pseudoaneurysms.  I discussed with her that I do not think endovascular revascularization of this fistula would be the right answer particularly since we could replace the proximal swing segment and she could likely still dialyze in the upper part of the arm.  There would be some risk  of needing a catheter at this time but more than likely can be avoided.  She demonstrates good understanding we will get her scheduled on a nondialysis day Tuesday or Thursday in the near future.  Lejon Afzal C. Donzetta Matters, MD [facility] [place] Office: [phone] Pager: [phone]

## 2017-09-23 ENCOUNTER — Encounter: Payer: Self-pay | Admitting: Nephrology

## 2017-09-23 DIAGNOSIS — D631 Anemia in chronic kidney disease: Secondary | ICD-10-CM | POA: Diagnosis not present

## 2017-09-23 DIAGNOSIS — D509 Iron deficiency anemia, unspecified: Secondary | ICD-10-CM | POA: Diagnosis not present

## 2017-09-23 DIAGNOSIS — M3210 Systemic lupus erythematosus, organ or system involvement unspecified: Secondary | ICD-10-CM | POA: Diagnosis not present

## 2017-09-23 DIAGNOSIS — N186 End stage renal disease: Secondary | ICD-10-CM | POA: Diagnosis not present

## 2017-09-23 DIAGNOSIS — N2581 Secondary hyperparathyroidism of renal origin: Secondary | ICD-10-CM | POA: Diagnosis not present

## 2017-09-24 ENCOUNTER — Encounter (HOSPITAL_COMMUNITY): Payer: Self-pay | Admitting: *Deleted

## 2017-09-24 ENCOUNTER — Other Ambulatory Visit: Payer: Self-pay

## 2017-09-24 NOTE — Progress Notes (Signed)
Pt denies any acute cardiopulmonary issues. Pt denies having a cardiac cath. Pt under the care of DR. Tamala Julian, Cardiology. Pt made aware to stop taking any otc vitamins, fish oil and herbal medications.  Do not take any NSAIDs ie: Ibuprofen, Advil, Naproxen (Aleve), Motrin, BC and Goody Powder. Pt verbalized understanding of all pre-op instructions. Anesthesia asked to review  Pt history.

## 2017-09-25 DIAGNOSIS — N186 End stage renal disease: Secondary | ICD-10-CM | POA: Diagnosis not present

## 2017-09-25 DIAGNOSIS — N2581 Secondary hyperparathyroidism of renal origin: Secondary | ICD-10-CM | POA: Diagnosis not present

## 2017-09-25 DIAGNOSIS — D631 Anemia in chronic kidney disease: Secondary | ICD-10-CM | POA: Diagnosis not present

## 2017-09-25 DIAGNOSIS — D509 Iron deficiency anemia, unspecified: Secondary | ICD-10-CM | POA: Diagnosis not present

## 2017-09-25 DIAGNOSIS — M3210 Systemic lupus erythematosus, organ or system involvement unspecified: Secondary | ICD-10-CM | POA: Diagnosis not present

## 2017-09-25 NOTE — Anesthesia Preprocedure Evaluation (Addendum)
Anesthesia Evaluation  Patient identified by MRN, date of birth, ID band Patient awake    Reviewed: Allergy & Precautions, NPO status , Patient's Chart, lab work & pertinent test results, reviewed documented beta blocker date and time   Airway Mallampati: I  TM Distance: >3 FB Neck ROM: Full    Dental  (+) Dental Advisory Given   Pulmonary Current Smoker,    breath sounds clear to auscultation       Cardiovascular hypertension, Pt. on medications and Pt. on home beta blockers (-) angina+ Valvular Problems/Murmurs (MS) AS  Rhythm:Regular Rate:Normal + Systolic murmurs 0/60 ECHO: EF 60-65%, bicuspid Aortic valve with mod AS, calcified MV leaflets with mod MS   Neuro/Psych CVA, No Residual Symptoms    GI/Hepatic GERD  Medicated and Controlled,(+)     substance abuse  marijuana use,   Endo/Other  Lupus  Renal/GU ESRF and DialysisRenal disease (K+ 3.4, dialysis yesterday)     Musculoskeletal   Abdominal   Peds  Hematology  (+) Blood dyscrasia (Hb 8.2), anemia ,   Anesthesia Other Findings   Reproductive/Obstetrics                            Anesthesia Physical Anesthesia Plan  ASA: III  Anesthesia Plan: MAC   Post-op Pain Management:    Induction:   PONV Risk Score and Plan: 1 and Ondansetron and Dexamethasone  Airway Management Planned: Natural Airway and Simple Face Mask  Additional Equipment:   Intra-op Plan:   Post-operative Plan:   Informed Consent: I have reviewed the patients History and Physical, chart, labs and discussed the procedure including the risks, benefits and alternatives for the proposed anesthesia with the patient or authorized representative who has indicated his/her understanding and acceptance.   Dental advisory given  Plan Discussed with: CRNA and Surgeon  Anesthesia Plan Comments: (Plan routine monitors, MAC)        Anesthesia Quick Evaluation

## 2017-09-25 NOTE — Progress Notes (Signed)
Anesthesia Chart Review: [name]  Case:  [id number] Date/Time:  09/26/17 0715   Procedure:  REVISION OF ARTERIOVENOUS FISTULA RIGHT ARM WITH ARTEGRAFT (Right )   Anesthesia type:  General   Pre-op diagnosis:  end stage renal disease   Location:  [facility] OR ROOM 11 / [facility] OR   Surgeon:  Waynetta Sandy, MD      DISCUSSION: Patient is a 57 year old female scheduled for the above procedure. History includes smoking, HTN (admissionfor hypertensive emergency in setting of viral illness 01/2017), ESRD (hemodialysis MWF, [place] [facility]), lupus, mitral and aortic stenosis, chronic diastolic CHF, CVA. Patient has a RUE AVF that was created in 06/2005. Recent fistulogram by Dr. 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 Dr. Donzetta Matters recommended excising the distal aneurysm and replacing with Artegraft in hopes staff can still access the proximal aneurysmal segment for dialysis until the new segment is ready for use--otherwise she will need a dialysis catheter placed as well. If AVF thromboses then she will likely need new a LUE AVF.   Patient has a biscuspid AV with mild-moderate AS and calcified mitral leaflets with moderate MS and mild-moderate MR by echo 07/2016. She saw cardiology last 09/2016 and is due for a follow-up echo this year. She now needs hemodialysis access revision in hopes to salvage her current AVF. She denied acute cardiopulmonary symptoms per her phone PAT RN and is tolerating hemodialysis 3x/week.  She is a same day work-up, so anesthesiologist will evaluate on the day of surgery. I would anticipate that if she is not exhibiting symptoms concerning for symptomatic severe valvular disease that she could proceed with planned procedure and continue out-patient cardiology follow-up of her valvular disease.   PROVIDERSBenito Mccreedy, MD is PCP Dorris Carnes, MD is cardiologist.  Last visit 09/25/16 with Richardson Dopp, PA-C. Repeat echo planned for around 07/2017 for surveillance of mitral and aortic stenosis--which has not occurred as of yet. (I did reach out to scheduling staff at [facility] since I did not see a follow-up visit scheduled yet, so they will contact patient to see if she will schedule her follow-up appointment up, and follow-up echo if already ordered.)  LABS: She is for ISTAT4 on the day of surgery.   IMAGES: CXR 01/26/17: IMPRESSION: 1. Cardiomegaly without edema 2. Streaky bibasilar opacities may reflect atelectasis or mild infiltrates  EKG: 01/26/17: NSR, possible LAE, rightward axis, pulmonary disease pattern, LVH, prolonged QT (QT 422 ms, QTc 493 ms).   CV: Echo 07/24/16: Study Conclusions - Left ventricle: The cavity size was normal. There was moderate   concentric hypertrophy. Systolic function was normal. The   estimated ejection fraction was in the range of 60% to 65%. Wall   motion was normal; there were no regional wall motion   abnormalities. Features are consistent with a pseudonormal left   ventricular filling pattern, with concomitant abnormal relaxation   and increased filling pressure (grade 2 diastolic dysfunction).   Doppler parameters are consistent with elevated ventricular   end-diastolic filling pressure. - Aortic valve: Bicuspid; moderately thickened, moderately   calcified leaflets. There was mild to moderate stenosis. Mean   gradient (S): 18 mm Hg. Peak gradient (S): 38 mm Hg. Valve area   (VTI): 1.84 cm^2. Valve area (Vmax): 1.47 cm^2. Valve area   (Vmean): 1.61 cm^2. - Aortic root: The aortic root was normal in size. -  Mitral valve: Calcified annulus. Severely thickened, moderately   calcified leaflets . The findings are consistent with moderate   stenosis. There was mild to moderate regurgitation. Valve area by   continuity equation (using LVOT flow): 1.76 cm^2. Mean gradient    9 mm Hg. Peak gradient 19 mm Hg.  -  Left atrium: The atrium was severely dilated. - Right ventricle: The cavity size was normal. Wall thickness was   normal. Systolic function was normal. - Right atrium: The atrium was mildly dilated. - Tricuspid valve: There was mild regurgitation. - Pulmonary arteries: Systolic pressure was within the normal   range. Impressions: - LVEF 60-65%.   Grade 2 diastolic dysfunction with elevated filling pressures.   Bicuspid aortic valve with mild to moderate aortic stenosis.   Normal size of the ascending aorta.   Mitral valve is severely thickened and calcified with moderate   mitral stenosis and mild to moderate mitral regurgitation. (Comparison echo 05/12/15: mild AS mean 14, peak 29; mild MS mead 5, peak 9)  Myoview 09/07/14 Overall Impression:  Normal stress nuclear study. LV Ejection Fraction: 59%.  LV Wall Motion:  NL LV Function; NL Wall Motion    Past Medical History:  Diagnosis Date  . Anemia   . Aortic stenosis 09/25/2016   Echo 07/24/16: Mod conc LVH, EF 60-65, no RWMA, Gr 2 DDd, bicuspid aortic valve, mild to mod AS (mean 18, peak 38), MAC, mod mitral stenosis (mean 9, peak 19), mild to mod MR, severe LAE, normal RVSF, mild RAE, mild TR  . Arthritis   . Blood transfusion '08   [facility]  . Chronic diastolic CHF (congestive heart failure) ([place])   . Dysfunctional uterine bleeding 12/19/2010  . ESRD (end stage renal disease) ([place])    dialysis   Mon Wed Fri  . GERD (gastroesophageal reflux disease)   . Headache   . Heart murmur   . Hemodialysis patient [facility])    right extremity port  . Hx of cardiovascular stress test    Lexiscan Myoview 4/16:  Normal stress nuclear study, EF 59%  . Hx of hiatal hernia   . Hypertension   . Lupus ([place])   . Mitral stenosis    Echo 4/16:  EF 55-60%, no RWMA, Gr 1 DD, mod MS (mean 9 mmHg), mod LAE, mild RAE, PASP 65, mod to severe TR, trivial eff  . Peptic ulcer disease   . Stroke [facility])    per patient "they said i had a small stroke but i couldnt  even tell"    Past Surgical History:  Procedure Laterality Date  . COLONOSCOPY W/ BIOPSIES AND POLYPECTOMY    . DIALYSIS FISTULA CREATION  2007  . ENDOMETRIAL ABLATION    . ESOPHAGOGASTRODUODENOSCOPY N/A 07/31/2014   Procedure: ESOPHAGOGASTRODUODENOSCOPY (EGD);  Surgeon: Clarene Essex, MD;  Location: [facility] ENDOSCOPY;  Service: Endoscopy;  Laterality: N/A;  . SHOULDER OPEN ROTATOR CUFF REPAIR Right 10/09/2016   Procedure: ROTATOR CUFF REPAIR SHOULDER OPEN partial acrominectomy and extensive synovectomy;  Surgeon: Latanya [name], MD;  Location: [facility] ORS;  Service: Orthopedics;  Laterality: Right;  RNFA    MEDICATIONS: No current facility-administered medications for this encounter.    [name] amLODipine (NORVASC) 10 MG tablet  . aspirin EC 81 MG tablet  . cloNIDine (CATAPRES) 0.2 MG tablet  . labetalol (NORMODYNE) 300 MG tablet  . lanthanum (FOSRENOL) 1000 MG chewable tablet  . lisinopril (PRINIVIL,ZESTRIL) 40 MG tablet  . multivitamin (RENA-VIT) TABS tablet  . ondansetron (ZOFRAN) 4 MG tablet  .  pantoprazole (PROTONIX) 40 MG tablet  . benzonatate (TESSALON) 100 MG capsule  . HYDROcodone-acetaminophen (NORCO/VICODIN) 5-325 MG tablet    George Hugh [facility] Short Stay Center/Anesthesiology Phone [phone] 09/25/2017 2:41 PM

## 2017-09-26 ENCOUNTER — Encounter (HOSPITAL_COMMUNITY)
Admission: RE | Disposition: A | Discharge status: Home / Self Care | Payer: Self-pay | Source: Ambulatory Visit | Attending: Vascular Surgery

## 2017-09-26 ENCOUNTER — Ambulatory Visit (HOSPITAL_COMMUNITY)
Admission: RE | Admit: 2017-09-26 | Discharge: 2017-09-26 | Disposition: A | Discharge status: Home / Self Care | Payer: Medicare Other | Source: Ambulatory Visit | Attending: Vascular Surgery | Admitting: Vascular Surgery

## 2017-09-26 ENCOUNTER — Telehealth: Payer: Self-pay | Admitting: Vascular Surgery

## 2017-09-26 ENCOUNTER — Encounter (HOSPITAL_COMMUNITY): Payer: Self-pay | Admitting: *Deleted

## 2017-09-26 ENCOUNTER — Ambulatory Visit (HOSPITAL_COMMUNITY): Payer: Medicare Other | Admitting: Vascular Surgery

## 2017-09-26 DIAGNOSIS — I132 Hypertensive heart and chronic kidney disease with heart failure and with stage 5 chronic kidney disease, or end stage renal disease: Secondary | ICD-10-CM | POA: Insufficient documentation

## 2017-09-26 DIAGNOSIS — F17210 Nicotine dependence, cigarettes, uncomplicated: Secondary | ICD-10-CM | POA: Diagnosis not present

## 2017-09-26 DIAGNOSIS — K219 Gastro-esophageal reflux disease without esophagitis: Secondary | ICD-10-CM | POA: Diagnosis not present

## 2017-09-26 DIAGNOSIS — I5032 Chronic diastolic (congestive) heart failure: Secondary | ICD-10-CM | POA: Insufficient documentation

## 2017-09-26 DIAGNOSIS — N186 End stage renal disease: Secondary | ICD-10-CM | POA: Diagnosis not present

## 2017-09-26 DIAGNOSIS — Z992 Dependence on renal dialysis: Secondary | ICD-10-CM | POA: Insufficient documentation

## 2017-09-26 DIAGNOSIS — I509 Heart failure, unspecified: Secondary | ICD-10-CM | POA: Diagnosis not present

## 2017-09-26 DIAGNOSIS — T82898A Other specified complication of vascular prosthetic devices, implants and grafts, initial encounter: Secondary | ICD-10-CM | POA: Diagnosis not present

## 2017-09-26 DIAGNOSIS — Z8673 Personal history of transient ischemic attack (TIA), and cerebral infarction without residual deficits: Secondary | ICD-10-CM | POA: Insufficient documentation

## 2017-09-26 DIAGNOSIS — Z79899 Other long term (current) drug therapy: Secondary | ICD-10-CM | POA: Diagnosis not present

## 2017-09-26 DIAGNOSIS — Z7982 Long term (current) use of aspirin: Secondary | ICD-10-CM | POA: Diagnosis not present

## 2017-09-26 DIAGNOSIS — Z79891 Long term (current) use of opiate analgesic: Secondary | ICD-10-CM | POA: Insufficient documentation

## 2017-09-26 DIAGNOSIS — T82858A Stenosis of vascular prosthetic devices, implants and grafts, initial encounter: Secondary | ICD-10-CM | POA: Diagnosis not present

## 2017-09-26 DIAGNOSIS — Y832 Surgical operation with anastomosis, bypass or graft as the cause of abnormal reaction of the patient, or of later complication, without mention of misadventure at the time of the procedure: Secondary | ICD-10-CM | POA: Insufficient documentation

## 2017-09-26 HISTORY — DX: Headache: R51

## 2017-09-26 HISTORY — DX: Unspecified osteoarthritis, unspecified site: M19.90

## 2017-09-26 HISTORY — DX: Headache, unspecified: R51.9

## 2017-09-26 HISTORY — DX: Cardiac murmur, unspecified: R01.1

## 2017-09-26 HISTORY — PX: REVISON OF ARTERIOVENOUS FISTULA: SHX6074

## 2017-09-26 HISTORY — PX: INSERTION OF ARTERIOVENOUS (AV) ARTEGRAFT ARM: SHX6779

## 2017-09-26 LAB — POCT I-STAT 4, (NA,K, GLUC, HGB,HCT)
GLUCOSE: 94 mg/dL (ref 65–99)
HEMATOCRIT: 24 % — AB (ref 36.0–46.0)
HEMOGLOBIN: 8.2 g/dL — AB (ref 12.0–15.0)
Potassium: 3.4 mmol/L — ABNORMAL LOW (ref 3.5–5.1)
Sodium: 138 mmol/L (ref 135–145)

## 2017-09-26 SURGERY — REVISON OF ARTERIOVENOUS FISTULA
Anesthesia: General | Site: Arm Upper | Laterality: Right

## 2017-09-26 MED ORDER — LIDOCAINE-EPINEPHRINE (PF) 1 %-1:200000 IJ SOLN
INTRAMUSCULAR | Status: DC | PRN
  Administered 2017-09-26: 30 mL

## 2017-09-26 MED ORDER — HEPARIN SODIUM (PORCINE) 1000 UNIT/ML IJ SOLN
INTRAMUSCULAR | Status: DC | PRN
  Administered 2017-09-26: 6000 [IU] via INTRAVENOUS
  Administered 2017-09-26: 3000 [IU] via INTRAVENOUS

## 2017-09-26 MED ORDER — HEPARIN SODIUM (PORCINE) 1000 UNIT/ML IJ SOLN
INTRAMUSCULAR | Status: AC
  Filled 2017-09-26: qty 1

## 2017-09-26 MED ORDER — SODIUM CHLORIDE 0.9 % IV SOLN
INTRAVENOUS | Status: DC
  Administered 2017-09-26 (×2): via INTRAVENOUS

## 2017-09-26 MED ORDER — LIDOCAINE-EPINEPHRINE (PF) 1 %-1:200000 IJ SOLN
INTRAMUSCULAR | Status: AC
  Filled 2017-09-26: qty 30

## 2017-09-26 MED ORDER — CEFAZOLIN SODIUM-DEXTROSE 2-4 GM/100ML-% IV SOLN
2.0000 g | INTRAVENOUS | Status: AC
  Administered 2017-09-26: 2 g via INTRAVENOUS
  Filled 2017-09-26: qty 100

## 2017-09-26 MED ORDER — MEPERIDINE HCL 50 MG/ML IJ SOLN: 6.2500 mg | INTRAMUSCULAR | Status: DC | PRN

## 2017-09-26 MED ORDER — LIDOCAINE 2% (20 MG/ML) 5 ML SYRINGE
INTRAMUSCULAR | Status: DC | PRN
  Administered 2017-09-26: 30 mg via INTRAVENOUS

## 2017-09-26 MED ORDER — PROPOFOL 10 MG/ML IV BOLUS
INTRAVENOUS | Status: AC
  Filled 2017-09-26: qty 20

## 2017-09-26 MED ORDER — PROPOFOL 10 MG/ML IV BOLUS
INTRAVENOUS | Status: DC | PRN
  Administered 2017-09-26: 90 mg via INTRAVENOUS
  Administered 2017-09-26 (×2): 20 mg via INTRAVENOUS
  Administered 2017-09-26: 70 mg via INTRAVENOUS

## 2017-09-26 MED ORDER — MIDAZOLAM HCL 2 MG/2ML IJ SOLN
INTRAMUSCULAR | Status: AC
  Filled 2017-09-26: qty 2

## 2017-09-26 MED ORDER — HYDROCODONE-ACETAMINOPHEN 5-325 MG PO TABS: 1.0000 | ORAL_TABLET | Freq: Four times a day (QID) | ORAL | 0 refills | Status: DC | PRN

## 2017-09-26 MED ORDER — EPHEDRINE SULFATE 50 MG/ML IJ SOLN
INTRAMUSCULAR | Status: DC | PRN
  Administered 2017-09-26 (×4): 10 mg via INTRAVENOUS

## 2017-09-26 MED ORDER — ONDANSETRON HCL 4 MG/2ML IJ SOLN
INTRAMUSCULAR | Status: AC
  Filled 2017-09-26: qty 2

## 2017-09-26 MED ORDER — PROTAMINE SULFATE 10 MG/ML IV SOLN
INTRAVENOUS | Status: DC | PRN
  Administered 2017-09-26: 25 mg via INTRAVENOUS
  Administered 2017-09-26: 5 mg via INTRAVENOUS
  Administered 2017-09-26 (×2): 10 mg via INTRAVENOUS

## 2017-09-26 MED ORDER — PROMETHAZINE HCL 25 MG/ML IJ SOLN: 6.2500 mg | INTRAMUSCULAR | Status: DC | PRN

## 2017-09-26 MED ORDER — EPHEDRINE SULFATE 50 MG/ML IJ SOLN
INTRAMUSCULAR | Status: AC
  Filled 2017-09-26: qty 1

## 2017-09-26 MED ORDER — DEXAMETHASONE SODIUM PHOSPHATE 10 MG/ML IJ SOLN
INTRAMUSCULAR | Status: AC
  Filled 2017-09-26: qty 1

## 2017-09-26 MED ORDER — LIDOCAINE 2% (20 MG/ML) 5 ML SYRINGE
INTRAMUSCULAR | Status: AC
  Filled 2017-09-26: qty 5

## 2017-09-26 MED ORDER — 0.9 % SODIUM CHLORIDE (POUR BTL) OPTIME
TOPICAL | Status: DC | PRN
  Administered 2017-09-26: 2000 mL

## 2017-09-26 MED ORDER — HEMOSTATIC AGENTS (NO CHARGE) OPTIME
TOPICAL | Status: DC | PRN
  Administered 2017-09-26: 1 via TOPICAL

## 2017-09-26 MED ORDER — PROPOFOL 500 MG/50ML IV EMUL
INTRAVENOUS | Status: DC | PRN
  Administered 2017-09-26: 75 ug/kg/min via INTRAVENOUS

## 2017-09-26 MED ORDER — PHENYLEPHRINE HCL 10 MG/ML IJ SOLN
INTRAVENOUS | Status: DC | PRN
  Administered 2017-09-26: 60 ug/min via INTRAVENOUS

## 2017-09-26 MED ORDER — PHENYLEPHRINE 40 MCG/ML (10ML) SYRINGE FOR IV PUSH (FOR BLOOD PRESSURE SUPPORT)
PREFILLED_SYRINGE | INTRAVENOUS | Status: AC
  Filled 2017-09-26: qty 10

## 2017-09-26 MED ORDER — FENTANYL CITRATE (PF) 250 MCG/5ML IJ SOLN
INTRAMUSCULAR | Status: AC
  Filled 2017-09-26: qty 5

## 2017-09-26 MED ORDER — FENTANYL CITRATE (PF) 100 MCG/2ML IJ SOLN
INTRAMUSCULAR | Status: DC | PRN
  Administered 2017-09-26 (×6): 25 ug via INTRAVENOUS

## 2017-09-26 MED ORDER — DEXAMETHASONE SODIUM PHOSPHATE 4 MG/ML IJ SOLN
INTRAMUSCULAR | Status: DC | PRN
  Administered 2017-09-26: 8 mg via INTRAVENOUS

## 2017-09-26 MED ORDER — FENTANYL CITRATE (PF) 100 MCG/2ML IJ SOLN
25.0000 ug | INTRAMUSCULAR | Status: DC | PRN
  Administered 2017-09-26 (×2): 25 ug via INTRAVENOUS

## 2017-09-26 MED ORDER — FENTANYL CITRATE (PF) 100 MCG/2ML IJ SOLN
INTRAMUSCULAR | Status: AC
  Filled 2017-09-26: qty 2

## 2017-09-26 MED ORDER — ONDANSETRON HCL 4 MG/2ML IJ SOLN
INTRAMUSCULAR | Status: DC | PRN
  Administered 2017-09-26: 4 mg via INTRAVENOUS

## 2017-09-26 MED ORDER — PHENYLEPHRINE 40 MCG/ML (10ML) SYRINGE FOR IV PUSH (FOR BLOOD PRESSURE SUPPORT)
PREFILLED_SYRINGE | INTRAVENOUS | Status: DC | PRN
  Administered 2017-09-26 (×2): 120 ug via INTRAVENOUS
  Administered 2017-09-26 (×2): 80 ug via INTRAVENOUS

## 2017-09-26 MED ORDER — SODIUM CHLORIDE 0.9 % IV SOLN
INTRAVENOUS | Status: AC
  Filled 2017-09-26 (×2): qty 1.2

## 2017-09-26 MED ORDER — MIDAZOLAM HCL 2 MG/2ML IJ SOLN: 0.5000 mg | Freq: Once | INTRAMUSCULAR | Status: DC | PRN

## 2017-09-26 MED ORDER — MIDAZOLAM HCL 5 MG/5ML IJ SOLN
INTRAMUSCULAR | Status: DC | PRN
  Administered 2017-09-26: 2 mg via INTRAVENOUS

## 2017-09-26 MED ORDER — SODIUM CHLORIDE 0.9 % IV SOLN
INTRAVENOUS | Status: DC | PRN
  Administered 2017-09-26 (×2)

## 2017-09-26 SURGICAL SUPPLY — 43 items
ADH SKN CLS APL DERMABOND .7 (GAUZE/BANDAGES/DRESSINGS) ×2
ARMBAND PINK RESTRICT EXTREMIT (MISCELLANEOUS) ×3 IMPLANT
BAG DECANTER FOR FLEXI CONT (MISCELLANEOUS) ×1 IMPLANT
BANDAGE ELASTIC 4 VELCRO ST LF (GAUZE/BANDAGES/DRESSINGS) ×2 IMPLANT
BLADE 11 SAFETY STRL DISP (BLADE) ×1 IMPLANT
CANISTER SUCT 3000ML PPV (MISCELLANEOUS) ×3 IMPLANT
CLIP VESOCCLUDE MED 6/CT (CLIP) ×3 IMPLANT
CLIP VESOCCLUDE SM WIDE 6/CT (CLIP) ×3 IMPLANT
COVER PROBE W GEL 5X96 (DRAPES) ×2 IMPLANT
DECANTER SPIKE VIAL GLASS SM (MISCELLANEOUS) ×1 IMPLANT
DERMABOND ADVANCED (GAUZE/BANDAGES/DRESSINGS) ×1
DERMABOND ADVANCED .7 DNX12 (GAUZE/BANDAGES/DRESSINGS) ×2 IMPLANT
ELECT REM PT RETURN 9FT ADLT (ELECTROSURGICAL) ×3
ELECTRODE REM PT RTRN 9FT ADLT (ELECTROSURGICAL) ×2 IMPLANT
GLOVE BIO SURGEON STRL SZ 6.5 (GLOVE) ×1 IMPLANT
GLOVE BIO SURGEON STRL SZ7.5 (GLOVE) ×3 IMPLANT
GLOVE BIOGEL PI IND STRL 6.5 (GLOVE) IMPLANT
GLOVE BIOGEL PI IND STRL 7.5 (GLOVE) ×1 IMPLANT
GLOVE BIOGEL PI INDICATOR 6.5 (GLOVE) ×2
GLOVE BIOGEL PI INDICATOR 7.5 (GLOVE) ×1
GLOVE ECLIPSE 7.0 STRL STRAW (GLOVE) ×1 IMPLANT
GLOVE SURG SS PI 7.0 STRL IVOR (GLOVE) ×2 IMPLANT
GOWN STRL REUS W/ TWL LRG LVL3 (GOWN DISPOSABLE) ×4 IMPLANT
GOWN STRL REUS W/ TWL XL LVL3 (GOWN DISPOSABLE) ×2 IMPLANT
GOWN STRL REUS W/TWL LRG LVL3 (GOWN DISPOSABLE) ×3
GOWN STRL REUS W/TWL XL LVL3 (GOWN DISPOSABLE) ×9
GRAFT COLLAGEN VASCULAR 6X15 (Vascular Products) ×1 IMPLANT
GRAFT COLLAGEN VASCULAR 6X19 (Vascular Products) IMPLANT
HEMOSTAT SNOW SURGICEL 2X4 (HEMOSTASIS) ×1 IMPLANT
KIT BASIN OR (CUSTOM PROCEDURE TRAY) ×3 IMPLANT
KIT TURNOVER KIT B (KITS) ×3 IMPLANT
NS IRRIG 1000ML POUR BTL (IV SOLUTION) ×3 IMPLANT
PACK CV ACCESS (CUSTOM PROCEDURE TRAY) ×3 IMPLANT
PAD ARMBOARD 7.5X6 YLW CONV (MISCELLANEOUS) ×6 IMPLANT
SUT MNCRL AB 4-0 PS2 18 (SUTURE) ×4 IMPLANT
SUT PROLENE 5 0 C 1 24 (SUTURE) ×6 IMPLANT
SUT PROLENE 6 0 BV (SUTURE) ×3 IMPLANT
SUT VIC AB 3-0 SH 27 (SUTURE) ×6
SUT VIC AB 3-0 SH 27X BRD (SUTURE) ×3 IMPLANT
SYR 50ML LL SCALE MARK (SYRINGE) ×1 IMPLANT
TOWEL GREEN STERILE (TOWEL DISPOSABLE) ×3 IMPLANT
UNDERPAD 30X30 (UNDERPADS AND DIAPERS) ×3 IMPLANT
WATER STERILE IRR 1000ML POUR (IV SOLUTION) ×3 IMPLANT

## 2017-09-26 NOTE — Telephone Encounter (Signed)
sch appt 10/18/17 F/U 1145am pt confirmed

## 2017-09-26 NOTE — Anesthesia Postprocedure Evaluation (Signed)
Anesthesia Post Note  Patient: [name]  Procedure(s) Performed: REVISION OF ARTERIOVENOUS FISTULA RIGHT ARM WITH ARTEGRAFT (Right ) INSERTION OF ARTERIOVENOUS (AV) ARTEGRAFT INTO RIGHT ARM (Right Arm Upper)     Patient location during evaluation: PACU Anesthesia Type: General Level of consciousness: awake and alert, oriented and patient cooperative Pain management: pain level controlled Vital Signs Assessment: post-procedure vital signs reviewed and stable Respiratory status: spontaneous breathing, nonlabored ventilation and respiratory function stable Cardiovascular status: blood pressure returned to baseline and stable Postop Assessment: no apparent nausea or vomiting Anesthetic complications: no    Last Vitals:  Vitals:   09/26/17 1026 09/26/17 1032  BP: (!) 154/79 (!) 165/95  Pulse: 78 84  Resp: 14 14  Temp:  (!) 36.3 C  SpO2: 90% 99%    Last Pain:  Vitals:   09/26/17 1032  TempSrc:   PainSc: Asleep                 Thadius Smisek,E. Iain Sawchuk

## 2017-09-26 NOTE — Telephone Encounter (Signed)
-----   Message from Mena Goes, RN sent at 09/26/2017  9:42 AM EDT ----- Regarding: 3-4 weeks    ----- Message ----- From: Ulyses Amor, PA-C Sent: 09/26/2017   9:35 AM To: Vvs Charge Pool  F/U in 3-4 weeks with Dr. Donzetta Matters not on PA schedule per his request pls.  S/P revision left UE av fistula with artagraft.

## 2017-09-26 NOTE — Anesthesia Procedure Notes (Addendum)
Procedure Name: LMA Insertion Date/Time: 09/26/2017 7:50 AM Performed by: Orlie Dakin, CRNA Pre-anesthesia Checklist: Patient identified, Emergency Drugs available, Suction available, Patient being monitored and Timeout performed Patient Re-evaluated:Patient Re-evaluated prior to induction Oxygen Delivery Method: Circle system utilized Preoxygenation: Pre-oxygenation with 100% oxygen Induction Type: IV induction LMA: LMA inserted LMA Size: 4.0 Tube type: Oral Number of attempts: 1 Placement Confirmation: positive ETCO2 and breath sounds checked- equal and bilateral Tube secured with: Tape Dental Injury: Teeth and Oropharynx as per pre-operative assessment

## 2017-09-26 NOTE — Transfer of Care (Signed)
Immediate Anesthesia Transfer of Care Note  Patient: Reagan Behlke  Procedure(s) Performed: REVISION OF ARTERIOVENOUS FISTULA RIGHT ARM WITH ARTEGRAFT (Right ) INSERTION OF ARTERIOVENOUS (AV) ARTEGRAFT INTO RIGHT ARM (Right Arm Upper)  Patient Location: PACU  Anesthesia Type:General  Level of Consciousness: drowsy and patient cooperative  Airway & Oxygen Therapy: Patient Spontanous Breathing and Patient connected to nasal cannula oxygen  Post-op Assessment: Report given to RN and Post -op Vital signs reviewed and stable  Post vital signs: Reviewed and stable  Last Vitals:  Vitals Value Taken Time  BP 143/85 09/26/2017  9:40 AM  Temp    Pulse 86 09/26/2017  9:42 AM  Resp 21 09/26/2017  9:42 AM  SpO2 92 % 09/26/2017  9:42 AM  Vitals shown include unvalidated device data.  Last Pain:  Vitals:   09/26/17 0613  TempSrc:   PainSc: 0-No pain         Complications: No apparent anesthesia complications

## 2017-09-26 NOTE — Discharge Instructions (Signed)
° °  [facility] ° °Discharge Instructions ° °AV Fistula or Graft Surgery for Dialysis Access ° °Please refer to the following instructions for your post-procedure care. Your surgeon or physician assistant will discuss any changes with you. ° °Activity ° °You may drive the day following your surgery, if you are comfortable and no longer taking prescription pain medication. Resume full activity as the soreness in your incision resolves. ° °Bathing/Showering ° °You may shower after you go home. Keep your incision dry for 48 hours. Do not soak in a bathtub, hot tub, or swim until the incision heals completely. You may not shower if you have a hemodialysis catheter. ° °Incision Care ° °Clean your incision with mild soap and water after 48 hours. Pat the area dry with a clean towel. You do not need a bandage unless otherwise instructed. Do not apply any ointments or creams to your incision. You may have skin glue on your incision. Do not peel it off. It will come off on its own in about one week. Your arm may swell a bit after surgery. To reduce swelling use pillows to elevate your arm so it is above your heart. Your doctor will tell you if you need to lightly wrap your arm with an ACE bandage. ° °Diet ° °Resume your normal diet. There are not special food restrictions following this procedure. In order to heal from your surgery, it is CRITICAL to get adequate nutrition. Your body requires vitamins, minerals, and protein. Vegetables are the best source of vitamins and minerals. Vegetables also provide the perfect balance of protein. Processed food has little nutritional value, so try to avoid this. ° °Medications ° °Resume taking all of your medications. If your incision is causing pain, you may take over-the counter pain relievers such as acetaminophen (Tylenol). If you were prescribed a stronger pain medication, please be aware these medications can cause nausea and constipation. Prevent  nausea by taking the medication with a snack or meal. Avoid constipation by drinking plenty of fluids and eating foods with high amount of fiber, such as fruits, vegetables, and grains. Do not take Tylenol if you are taking prescription pain medications. ° ° ° ° °Follow up °Your surgeon may want to see you in the office following your access surgery. If so, this will be arranged at the time of your surgery. ° °Please call us immediately for any of the following conditions: ° °Increased pain, redness, drainage (pus) from your incision site °Fever of 101 degrees or higher °Severe or worsening pain at your incision site °Hand pain or numbness. ° °Reduce your risk of vascular disease: ° °Stop smoking. If you would like help, call [facility] at 1-800-QUIT-NOW (1-800-784-8669) or [facility] at 336-586-4000 ° °Manage your cholesterol °Maintain a desired weight °Control your diabetes °Keep your blood pressure down ° °Dialysis ° °It will take several weeks to several months for your new dialysis access to be ready for use. Your surgeon will determine when it is OK to use it. Your nephrologist will continue to direct your dialysis. You can continue to use your Permcath until your new access is ready for use. ° °If you have any questions, please call the office at [phone]. ° °

## 2017-09-26 NOTE — Op Note (Signed)
    Patient name: [name] MRN: [id number] DOB: 05/06/1961 Sex: female  09/26/2017 Pre-operative Diagnosis: End-stage renal disease, malfunction right upper arm AV fistula Post-operative diagnosis:  Same Surgeon:  Eda Paschal. Donzetta Matters, MD Assistant: Laurence Slate, PA Procedure Performed: Revision of right upper arm AV fistula with interposition placement of 6 mm Artegraft  Indications:  57 year old female with a history of a right upper arm AV fistula that was placed over 10 years ago.  She has had multiple open and endovascular revisions but now has a very tight stenosis of the swing segment of the fistula and is indicated for open replacement with interposition graft.  Findings: Proximal fistula was heavily calcified.  Prior to intervention there was very minimal flow in the more proximal arm distally on the fistula.  At completion there was a strong thrill in the Artegraft and pulsatility in the upper arm fistula.   Procedure:  The patient was identified in the holding area and taken to the operating room where she was placed supine on the operative table and general anesthesia was induced.  She was sterilely prepped and draped in the right upper extremity given antibiotics and a timeout was called.  Following this we began with a transverse incision near the arterial anastomosis at the site of what appeared to be her original incision.  We dissected down to the arterial anastomosis placed a vessel loop around the fistula proximally.  We then made a counterincision near the mid upper arm below where the fistula is usable still.  We dissected down the fistula placed a vessel around this.  Patient was then given 6000 units of heparin and we clamped proximally and distally.  We then transected the fistula near the swing segment where it was heavily calcified and nearly occluded.  We then were able to dissected out transected it distally passed off the table.  The proximal area of the fistula so calcified  that I do not think this would be able to sew.  We then dissected out the brachial artery proximally distally and were able to clamp these vessels.  I did excise more of the fistula back near the anastomosis but did leave a cuff that did have some calcium and this was endarterectomized.  Distally I noted that there was some clot so an additional 3000 use of heparin was given and I flushed the fistula with heparinized saline.  Artegraft was then sewn end-to-end near the arterial anastomosis with 5-0 Prolene suture.  I completion we did have some areas of leaking and I did have to place a pledgeted suture.  We then had strong inflow the Artegraft was clamped and tunneled.  I then spatulated the end and sewed it and then with 5-0 Prolene suture.  Prior to completing this anastomosis we allowed flushing through the Artegraft as well as backbleeding from the fistula and there was no residual clot.  Upon completing the anastomosis we then had very strong flow throughout the fistula both in the new segment and the native segment.  50 mg of protamine was given and tolerated well.  We then obtained hemostasis and irrigated the wound and closed both incisions and 2 layers with 3-0 Vicryl and 4-0 Monocryl.  Dermabond was placed above that.  A light Ace wrap was placed.  She tolerated this procedure well without any comp occasion.  Next  EBL 250 cc.   Cannen Dupras C. Donzetta Matters, MD [facility] Office: [phone] Pager: [phone]

## 2017-09-26 NOTE — H&P (Signed)
   History and Physical Update  The patient was interviewed and re-examined.  The patient's previous History and Physical has been reviewed and is unchanged from recent office visit. Plan to replace the swing segment of avf with artegraft if possible. Discussed that this fistula may be nearing the end of its use but will try to avoid catheter placement today given that upper arm can still be cannulated.  Nikita Surman C. Donzetta Matters, MD [facility] Office: [phone] Pager: [phone]  09/26/2017, 7:20 AM

## 2017-09-27 ENCOUNTER — Encounter (HOSPITAL_COMMUNITY): Payer: Self-pay | Admitting: Vascular Surgery

## 2017-09-27 DIAGNOSIS — D509 Iron deficiency anemia, unspecified: Secondary | ICD-10-CM | POA: Diagnosis not present

## 2017-09-27 DIAGNOSIS — M3210 Systemic lupus erythematosus, organ or system involvement unspecified: Secondary | ICD-10-CM | POA: Diagnosis not present

## 2017-09-27 DIAGNOSIS — D631 Anemia in chronic kidney disease: Secondary | ICD-10-CM | POA: Diagnosis not present

## 2017-09-27 DIAGNOSIS — N186 End stage renal disease: Secondary | ICD-10-CM | POA: Diagnosis not present

## 2017-09-27 DIAGNOSIS — N2581 Secondary hyperparathyroidism of renal origin: Secondary | ICD-10-CM | POA: Diagnosis not present

## 2017-09-28 DIAGNOSIS — F17210 Nicotine dependence, cigarettes, uncomplicated: Secondary | ICD-10-CM | POA: Diagnosis not present

## 2017-09-28 DIAGNOSIS — I132 Hypertensive heart and chronic kidney disease with heart failure and with stage 5 chronic kidney disease, or end stage renal disease: Secondary | ICD-10-CM | POA: Diagnosis not present

## 2017-09-28 DIAGNOSIS — N186 End stage renal disease: Secondary | ICD-10-CM | POA: Diagnosis not present

## 2017-09-28 DIAGNOSIS — T82510S Breakdown (mechanical) of surgically created arteriovenous fistula, sequela: Secondary | ICD-10-CM | POA: Diagnosis not present

## 2017-09-28 DIAGNOSIS — I5032 Chronic diastolic (congestive) heart failure: Secondary | ICD-10-CM | POA: Diagnosis not present

## 2017-09-28 DIAGNOSIS — Z992 Dependence on renal dialysis: Secondary | ICD-10-CM | POA: Diagnosis not present

## 2017-09-30 DIAGNOSIS — N2581 Secondary hyperparathyroidism of renal origin: Secondary | ICD-10-CM | POA: Diagnosis not present

## 2017-09-30 DIAGNOSIS — M3210 Systemic lupus erythematosus, organ or system involvement unspecified: Secondary | ICD-10-CM | POA: Diagnosis not present

## 2017-09-30 DIAGNOSIS — N186 End stage renal disease: Secondary | ICD-10-CM | POA: Diagnosis not present

## 2017-09-30 DIAGNOSIS — D631 Anemia in chronic kidney disease: Secondary | ICD-10-CM | POA: Diagnosis not present

## 2017-09-30 DIAGNOSIS — D509 Iron deficiency anemia, unspecified: Secondary | ICD-10-CM | POA: Diagnosis not present

## 2017-10-02 DIAGNOSIS — D509 Iron deficiency anemia, unspecified: Secondary | ICD-10-CM | POA: Diagnosis not present

## 2017-10-02 DIAGNOSIS — F17210 Nicotine dependence, cigarettes, uncomplicated: Secondary | ICD-10-CM | POA: Diagnosis not present

## 2017-10-02 DIAGNOSIS — D631 Anemia in chronic kidney disease: Secondary | ICD-10-CM | POA: Diagnosis not present

## 2017-10-02 DIAGNOSIS — T82510S Breakdown (mechanical) of surgically created arteriovenous fistula, sequela: Secondary | ICD-10-CM | POA: Diagnosis not present

## 2017-10-02 DIAGNOSIS — Z992 Dependence on renal dialysis: Secondary | ICD-10-CM | POA: Diagnosis not present

## 2017-10-02 DIAGNOSIS — M3210 Systemic lupus erythematosus, organ or system involvement unspecified: Secondary | ICD-10-CM | POA: Diagnosis not present

## 2017-10-02 DIAGNOSIS — N2581 Secondary hyperparathyroidism of renal origin: Secondary | ICD-10-CM | POA: Diagnosis not present

## 2017-10-02 DIAGNOSIS — I5032 Chronic diastolic (congestive) heart failure: Secondary | ICD-10-CM | POA: Diagnosis not present

## 2017-10-02 DIAGNOSIS — I132 Hypertensive heart and chronic kidney disease with heart failure and with stage 5 chronic kidney disease, or end stage renal disease: Secondary | ICD-10-CM | POA: Diagnosis not present

## 2017-10-02 DIAGNOSIS — N186 End stage renal disease: Secondary | ICD-10-CM | POA: Diagnosis not present

## 2017-10-04 DIAGNOSIS — N186 End stage renal disease: Secondary | ICD-10-CM | POA: Diagnosis not present

## 2017-10-04 DIAGNOSIS — I5032 Chronic diastolic (congestive) heart failure: Secondary | ICD-10-CM | POA: Diagnosis not present

## 2017-10-04 DIAGNOSIS — D509 Iron deficiency anemia, unspecified: Secondary | ICD-10-CM | POA: Diagnosis not present

## 2017-10-04 DIAGNOSIS — D631 Anemia in chronic kidney disease: Secondary | ICD-10-CM | POA: Diagnosis not present

## 2017-10-04 DIAGNOSIS — M3210 Systemic lupus erythematosus, organ or system involvement unspecified: Secondary | ICD-10-CM | POA: Diagnosis not present

## 2017-10-04 DIAGNOSIS — F17210 Nicotine dependence, cigarettes, uncomplicated: Secondary | ICD-10-CM | POA: Diagnosis not present

## 2017-10-04 DIAGNOSIS — N2581 Secondary hyperparathyroidism of renal origin: Secondary | ICD-10-CM | POA: Diagnosis not present

## 2017-10-04 DIAGNOSIS — T82510S Breakdown (mechanical) of surgically created arteriovenous fistula, sequela: Secondary | ICD-10-CM | POA: Diagnosis not present

## 2017-10-04 DIAGNOSIS — Z992 Dependence on renal dialysis: Secondary | ICD-10-CM | POA: Diagnosis not present

## 2017-10-04 DIAGNOSIS — I132 Hypertensive heart and chronic kidney disease with heart failure and with stage 5 chronic kidney disease, or end stage renal disease: Secondary | ICD-10-CM | POA: Diagnosis not present

## 2017-10-07 DIAGNOSIS — D631 Anemia in chronic kidney disease: Secondary | ICD-10-CM | POA: Diagnosis not present

## 2017-10-07 DIAGNOSIS — M3210 Systemic lupus erythematosus, organ or system involvement unspecified: Secondary | ICD-10-CM | POA: Diagnosis not present

## 2017-10-07 DIAGNOSIS — N2581 Secondary hyperparathyroidism of renal origin: Secondary | ICD-10-CM | POA: Diagnosis not present

## 2017-10-07 DIAGNOSIS — N186 End stage renal disease: Secondary | ICD-10-CM | POA: Diagnosis not present

## 2017-10-07 DIAGNOSIS — D509 Iron deficiency anemia, unspecified: Secondary | ICD-10-CM | POA: Diagnosis not present

## 2017-10-08 DIAGNOSIS — I132 Hypertensive heart and chronic kidney disease with heart failure and with stage 5 chronic kidney disease, or end stage renal disease: Secondary | ICD-10-CM | POA: Diagnosis not present

## 2017-10-08 DIAGNOSIS — Z992 Dependence on renal dialysis: Secondary | ICD-10-CM | POA: Diagnosis not present

## 2017-10-08 DIAGNOSIS — I5032 Chronic diastolic (congestive) heart failure: Secondary | ICD-10-CM | POA: Diagnosis not present

## 2017-10-08 DIAGNOSIS — F17210 Nicotine dependence, cigarettes, uncomplicated: Secondary | ICD-10-CM | POA: Diagnosis not present

## 2017-10-08 DIAGNOSIS — T82510S Breakdown (mechanical) of surgically created arteriovenous fistula, sequela: Secondary | ICD-10-CM | POA: Diagnosis not present

## 2017-10-08 DIAGNOSIS — N186 End stage renal disease: Secondary | ICD-10-CM | POA: Diagnosis not present

## 2017-10-09 DIAGNOSIS — D509 Iron deficiency anemia, unspecified: Secondary | ICD-10-CM | POA: Diagnosis not present

## 2017-10-09 DIAGNOSIS — M3210 Systemic lupus erythematosus, organ or system involvement unspecified: Secondary | ICD-10-CM | POA: Diagnosis not present

## 2017-10-09 DIAGNOSIS — D631 Anemia in chronic kidney disease: Secondary | ICD-10-CM | POA: Diagnosis not present

## 2017-10-09 DIAGNOSIS — N186 End stage renal disease: Secondary | ICD-10-CM | POA: Diagnosis not present

## 2017-10-09 DIAGNOSIS — N2581 Secondary hyperparathyroidism of renal origin: Secondary | ICD-10-CM | POA: Diagnosis not present

## 2017-10-11 DIAGNOSIS — N186 End stage renal disease: Secondary | ICD-10-CM | POA: Diagnosis not present

## 2017-10-11 DIAGNOSIS — N2581 Secondary hyperparathyroidism of renal origin: Secondary | ICD-10-CM | POA: Diagnosis not present

## 2017-10-11 DIAGNOSIS — D509 Iron deficiency anemia, unspecified: Secondary | ICD-10-CM | POA: Diagnosis not present

## 2017-10-11 DIAGNOSIS — D631 Anemia in chronic kidney disease: Secondary | ICD-10-CM | POA: Diagnosis not present

## 2017-10-11 DIAGNOSIS — M3210 Systemic lupus erythematosus, organ or system involvement unspecified: Secondary | ICD-10-CM | POA: Diagnosis not present

## 2017-10-14 ENCOUNTER — Emergency Department (HOSPITAL_COMMUNITY)
Admission: EM | Admit: 2017-10-14 | Discharge: 2017-10-14 | Disposition: A | Discharge status: Home / Self Care | Payer: Medicare Other | Attending: Emergency Medicine | Admitting: Emergency Medicine

## 2017-10-14 ENCOUNTER — Encounter (HOSPITAL_COMMUNITY): Payer: Self-pay

## 2017-10-14 DIAGNOSIS — I129 Hypertensive chronic kidney disease with stage 1 through stage 4 chronic kidney disease, or unspecified chronic kidney disease: Secondary | ICD-10-CM | POA: Diagnosis not present

## 2017-10-14 DIAGNOSIS — N186 End stage renal disease: Secondary | ICD-10-CM | POA: Insufficient documentation

## 2017-10-14 DIAGNOSIS — D649 Anemia, unspecified: Secondary | ICD-10-CM | POA: Diagnosis not present

## 2017-10-14 DIAGNOSIS — N2581 Secondary hyperparathyroidism of renal origin: Secondary | ICD-10-CM | POA: Diagnosis not present

## 2017-10-14 DIAGNOSIS — D509 Iron deficiency anemia, unspecified: Secondary | ICD-10-CM | POA: Diagnosis not present

## 2017-10-14 DIAGNOSIS — I132 Hypertensive heart and chronic kidney disease with heart failure and with stage 5 chronic kidney disease, or end stage renal disease: Secondary | ICD-10-CM | POA: Insufficient documentation

## 2017-10-14 DIAGNOSIS — N189 Chronic kidney disease, unspecified: Secondary | ICD-10-CM | POA: Diagnosis not present

## 2017-10-14 DIAGNOSIS — I5032 Chronic diastolic (congestive) heart failure: Secondary | ICD-10-CM | POA: Insufficient documentation

## 2017-10-14 DIAGNOSIS — D631 Anemia in chronic kidney disease: Secondary | ICD-10-CM

## 2017-10-14 DIAGNOSIS — F17210 Nicotine dependence, cigarettes, uncomplicated: Secondary | ICD-10-CM | POA: Insufficient documentation

## 2017-10-14 DIAGNOSIS — Z992 Dependence on renal dialysis: Secondary | ICD-10-CM | POA: Insufficient documentation

## 2017-10-14 DIAGNOSIS — Z79899 Other long term (current) drug therapy: Secondary | ICD-10-CM | POA: Insufficient documentation

## 2017-10-14 DIAGNOSIS — M3210 Systemic lupus erythematosus, organ or system involvement unspecified: Secondary | ICD-10-CM | POA: Diagnosis not present

## 2017-10-14 DIAGNOSIS — Z7982 Long term (current) use of aspirin: Secondary | ICD-10-CM | POA: Insufficient documentation

## 2017-10-14 LAB — COMPREHENSIVE METABOLIC PANEL
ALT: 18 U/L (ref 14–54)
AST: 25 U/L (ref 15–41)
Albumin: 3.5 g/dL (ref 3.5–5.0)
Alkaline Phosphatase: 98 U/L (ref 38–126)
Anion gap: 13 (ref 5–15)
BILIRUBIN TOTAL: 0.9 mg/dL (ref 0.3–1.2)
BUN: 13 mg/dL (ref 6–20)
CALCIUM: 8.6 mg/dL — AB (ref 8.9–10.3)
CHLORIDE: 95 mmol/L — AB (ref 101–111)
CO2: 30 mmol/L (ref 22–32)
Creatinine, Ser: 3.51 mg/dL — ABNORMAL HIGH (ref 0.44–1.00)
GFR calc Af Amer: 16 mL/min — ABNORMAL LOW (ref >60)
GFR, EST NON AFRICAN AMERICAN: 14 mL/min — AB (ref >60)
GLUCOSE: 83 mg/dL (ref 65–99)
Potassium: 3.1 mmol/L — ABNORMAL LOW (ref 3.5–5.1)
Sodium: 138 mmol/L (ref 135–145)
TOTAL PROTEIN: 7.2 g/dL (ref 6.5–8.1)

## 2017-10-14 LAB — CBC
HCT: 25.2 % — ABNORMAL LOW (ref 36.0–46.0)
Hemoglobin: 8 g/dL — ABNORMAL LOW (ref 12.0–15.0)
MCH: 32.4 pg (ref 26.0–34.0)
MCHC: 31.7 g/dL (ref 30.0–36.0)
MCV: 102 fL — ABNORMAL HIGH (ref 78.0–100.0)
PLATELETS: 254 10*3/uL (ref 150–400)
RBC: 2.47 MIL/uL — ABNORMAL LOW (ref 3.87–5.11)
RDW: 16.3 % — AB (ref 11.5–15.5)
WBC: 5.3 10*3/uL (ref 4.0–10.5)

## 2017-10-14 NOTE — Discharge Instructions (Addendum)
Your hemoglobin today is 8.0.  Follow-up closely with your kidney specialist for further outpatient management of your low blood counts.  Return to the ER if you develop shortness of breath, chest pain, or worsening weakness or signs or symptoms of bleeding.

## 2017-10-14 NOTE — ED Provider Notes (Signed)
[facility] EMERGENCY DEPARTMENT Provider Note   CSN: [id number] Arrival date & time: 10/14/17  1456     History   Chief Complaint Chief Complaint  Patient presents with  . Abnormal Lab    HPI [name] is a 57 y.o. female.  HPI  57 year old female presents with anemia.  Told by dialysis to come to the ER for a blood transfusion for hemoglobin of 7.3.  This was last checked on 5/24.  She was told to go to the ER she is feeling bad for a transfusion.  She did not come because of the weekend and then at dialysis after her dialysis session they told her she needed a transfusion so sent her here.  She feels weak and fatigued but that has been going on for about 2 or 3 weeks since her new AV fistula was put in.  She think she lost a lot of blood during the surgery but is not sure.  No chest pain.  No focal weakness.  Past Medical History:  Diagnosis Date  . Anemia   . Aortic stenosis 09/25/2016   Echo 07/24/16: Mod conc LVH, EF 60-65, no RWMA, Gr 2 DDd, bicuspid aortic valve, mild to mod AS (mean 18, peak 38), MAC, mod mitral stenosis (mean 9, peak 19), mild to mod MR, severe LAE, normal RVSF, mild RAE, mild TR  . Arthritis   . Blood transfusion '08   [facility]  . Chronic diastolic CHF (congestive heart failure) ([place])   . Dysfunctional uterine bleeding 12/19/2010  . ESRD (end stage renal disease) ([place])    dialysis   Mon Wed Fri  . GERD (gastroesophageal reflux disease)   . Headache   . Heart murmur   . Hemodialysis patient [facility])    right extremity port  . Hx of cardiovascular stress test    Lexiscan Myoview 4/16:  Normal stress nuclear study, EF 59%  . Hx of hiatal hernia   . Hypertension   . Lupus ([place])   . Mitral stenosis    Echo 4/16:  EF 55-60%, no RWMA, Gr 1 DD, mod MS (mean 9 mmHg), mod LAE, mild RAE, PASP 65, mod to severe TR, trivial eff  . Peptic ulcer disease   . Stroke [facility])    per patient "they said i had a small stroke but i couldnt even tell"     Patient Active Problem List   Diagnosis Date Noted  . Fluid overload 01/26/2017  . HTN (hypertension) 01/26/2017  . Rotator cuff tear arthropathy, right 10/09/2016  . Aortic stenosis 09/25/2016  . Leukocytosis   . Colitis 05/01/2015  . History of sepsis 05/01/2015  . Hypoxia 05/01/2015  . Mitral stenosis 08/30/2014  . End-stage renal disease on hemodialysis ([place]) 07/25/2014  . History of pulmonary edema 07/25/2014  . Lupus (systemic lupus erythematosus) ([place]) 07/25/2014  . DUB (dysfunctional uterine bleeding) 07/25/2014  . Anemia 07/25/2014  . Peptic ulcer disease 07/25/2014  . History of stroke 07/25/2014  . Hypertensive heart disease with CHF (congestive heart failure) ([place]) 12/19/2010  . Dialysis care 12/19/2010    Past Surgical History:  Procedure Laterality Date  . COLONOSCOPY W/ BIOPSIES AND POLYPECTOMY    . DIALYSIS FISTULA CREATION  2007  . ENDOMETRIAL ABLATION    . ESOPHAGOGASTRODUODENOSCOPY N/A 07/31/2014   Procedure: ESOPHAGOGASTRODUODENOSCOPY (EGD);  Surgeon: Clarene Essex, MD;  Location: [facility] ENDOSCOPY;  Service: Endoscopy;  Laterality: N/A;  . INSERTION OF ARTERIOVENOUS (AV) ARTEGRAFT ARM Right 09/26/2017   Procedure: INSERTION OF  ARTERIOVENOUS (AV) ARTEGRAFT INTO RIGHT ARM;  Surgeon: Waynetta Sandy, MD;  Location: [facility];  Service: Vascular;  Laterality: Right;  . REVISON OF ARTERIOVENOUS FISTULA Right 09/26/2017   Procedure: REVISION OF ARTERIOVENOUS FISTULA RIGHT ARM WITH ARTEGRAFT;  Surgeon: Waynetta Sandy, MD;  Location: [facility];  Service: Vascular;  Laterality: Right;  . SHOULDER OPEN ROTATOR CUFF REPAIR Right 10/09/2016   Procedure: ROTATOR CUFF REPAIR SHOULDER OPEN partial acrominectomy and extensive synovectomy;  Surgeon: Latanya [name], MD;  Location: [facility];  Service: Orthopedics;  Laterality: Right;  RNFA     OB History   None      Home Medications    Prior to Admission medications   Medication Sig Start Date End Date Taking?  Authorizing Provider  amLODipine (NORVASC) 10 MG tablet Take 10 mg by mouth at bedtime.     [provider]  aspirin EC 81 MG tablet Take 81 mg by mouth at bedtime.    [provider]  benzonatate (TESSALON) 100 MG capsule Take 1 capsule (100 mg total) by mouth 3 (three) times daily as needed for cough. Patient not taking: Reported on 05/15/2017 01/30/17   Aline August, MD  cloNIDine (CATAPRES) 0.2 MG tablet Take 1 tablet (0.2 mg total) by mouth 2 (two) times daily. 09/25/16   Richardson Dopp T, PA-C  HYDROcodone-acetaminophen (NORCO/VICODIN) 5-325 MG tablet Take 1 tablet by mouth every 6 (six) hours as needed for severe pain. 09/26/17   Ulyses Amor, PA-C  labetalol (NORMODYNE) 300 MG tablet Take 300 mg by mouth 3 (three) times daily.     [provider]  lanthanum (FOSRENOL) 1000 MG chewable tablet Chew 2 tablets (2,000 mg total) by mouth 3 (three) times daily with meals. 05/05/15   Eugenie Filler, MD  lisinopril (PRINIVIL,ZESTRIL) 40 MG tablet Take 1 tablet (40 mg total) by mouth 2 (two) times daily. 01/30/17   Aline August, MD  multivitamin (RENA-VIT) TABS tablet Take 1 tablet by mouth daily.    [provider]  ondansetron (ZOFRAN) 4 MG tablet Take 1 tablet (4 mg total) by mouth every 6 (six) hours. Patient taking differently: Take 4 mg by mouth every 6 (six) hours as needed for nausea or vomiting.  05/15/17   Nils Flack, Mina A, PA-C  pantoprazole (PROTONIX) 40 MG tablet Take 1 tablet (40 mg total) by mouth 2 (two) times daily. Patient taking differently: Take 40 mg by mouth daily.  08/01/14   Domenic Polite, MD    Family History Family History  Problem Relation Age of Onset  . Liver cancer Maternal Grandmother   . Lymphoma Maternal Aunt   . Hypertension Mother   . Renal Disease Father   . Hypertension Father   . Heart attack Neg Hx     Social History Social History   Tobacco Use  . Smoking status: Current Some Day Smoker    Packs/day: 0.25     Types: Cigarettes  . Smokeless tobacco: Never Used  Substance Use Topics  . Alcohol use: Yes    Alcohol/week: 0.0 oz    Comment: occasional  . Drug use: Yes    Types: Marijuana    Comment: occasionally     Allergies   Patient has no known allergies.   Review of Systems Review of Systems  Constitutional: Positive for fatigue.  Cardiovascular: Negative for chest pain.  Gastrointestinal: Negative for blood in stool and vomiting.  All other systems reviewed and are negative.    Physical Exam Updated Vital  Signs BP (!) 183/93 (BP Location: Right Arm)   Pulse 94   Temp 98.7 F (37.1 C) (Oral)   Resp 18   LMP 12/05/2010 (LMP Unknown)   SpO2 100%   Physical Exam  Constitutional: She is oriented to person, place, and time. She appears well-developed and well-nourished. No distress.  HENT:  Head: Normocephalic and atraumatic.  Right Ear: External ear normal.  Left Ear: External ear normal.  Nose: Nose normal.  Eyes: Right eye exhibits no discharge. Left eye exhibits no discharge.  Cardiovascular: Normal rate and regular rhythm.  Murmur heard. Pulmonary/Chest: Effort normal and breath sounds normal.  Abdominal: Soft. She exhibits no distension. There is no tenderness.  Musculoskeletal:  Right arm av fistula in place  Neurological: She is alert and oriented to person, place, and time.  CN 3-12 grossly intact. 5/5 strength in all 4 extremities. Grossly normal sensation. Normal finger to nose.   Skin: Skin is warm and dry. She is not diaphoretic.  Nursing note and vitals reviewed.    ED Treatments / Results  Labs (all labs ordered are listed, but only abnormal results are displayed) Labs Reviewed  COMPREHENSIVE METABOLIC PANEL - Abnormal; Notable for the following components:      Result Value   Potassium 3.1 (*)    Chloride 95 (*)    Creatinine, Ser 3.51 (*)    Calcium 8.6 (*)    GFR calc non Af Amer 14 (*)    GFR calc Af Amer 16 (*)    All other components  within normal limits  CBC - Abnormal; Notable for the following components:   RBC 2.47 (*)    Hemoglobin 8.0 (*)    HCT 25.2 (*)    MCV 102.0 (*)    RDW 16.3 (*)    All other components within normal limits    EKG EKG Interpretation  Date/Time:  Monday Oct 14 2017 19:38:27 EDT Ventricular Rate:  89 PR Interval:    QRS Duration: 113 QT Interval:  384 QTC Calculation: 468 R Axis:   94 Text Interpretation:  Normal sinus rhythm Left atrial enlargement Incomplete right bundle branch block Baseline wander in lead(s) V4 V5 V6 no significant change compared to Sept 2018 Confirmed by Sherwood Gambler [phone]) on 10/14/2017 7:48:06 PM   Radiology No results found.  Procedures Procedures (including critical care time)  Medications Ordered in ED Medications - No data to display   Initial Impression / Assessment and Plan / ED Course  I have reviewed the triage vital signs and the nursing notes.  Pertinent labs & imaging results that were available during my care of the patient were reviewed by me and considered in my medical decision making (see chart for details).     Hemoglobin today is 8.0.  On 5/9 it was 8.2, day she had the surgery.  I do not have access to the 7.3 she is talking about but at this point she does not need an emergent blood transfusion.  She appears stable for discharge home for outpatient anemia management by her PCP and/or nephrologist.  ECG without acute ischemic changes and no chest pain.  The nonspecific fatigue is probably from the chronic anemia but she does not appear to have an emergent condition.  Return precautions.  Final Clinical Impressions(s) / ED Diagnoses   Final diagnoses:  Anemia due to chronic kidney disease, on chronic dialysis [facility])    ED Discharge Orders    None       Regenia Skeeter,  Nicki Reaper, MD 10/14/17 1958

## 2017-10-14 NOTE — ED Triage Notes (Signed)
Pt presents for low hemoglobin. Dialysis patient, received full treatment PTA. Pt reports dialysis sent her here for worsening fatigue and Hgb 7.3.

## 2017-10-16 DIAGNOSIS — M3210 Systemic lupus erythematosus, organ or system involvement unspecified: Secondary | ICD-10-CM | POA: Diagnosis not present

## 2017-10-16 DIAGNOSIS — D509 Iron deficiency anemia, unspecified: Secondary | ICD-10-CM | POA: Diagnosis not present

## 2017-10-16 DIAGNOSIS — D631 Anemia in chronic kidney disease: Secondary | ICD-10-CM | POA: Diagnosis not present

## 2017-10-16 DIAGNOSIS — N186 End stage renal disease: Secondary | ICD-10-CM | POA: Diagnosis not present

## 2017-10-16 DIAGNOSIS — N2581 Secondary hyperparathyroidism of renal origin: Secondary | ICD-10-CM | POA: Diagnosis not present

## 2017-10-18 ENCOUNTER — Ambulatory Visit (INDEPENDENT_AMBULATORY_CARE_PROVIDER_SITE_OTHER): Payer: Self-pay | Admitting: Vascular Surgery

## 2017-10-18 ENCOUNTER — Encounter: Payer: Medicare Other | Admitting: Vascular Surgery

## 2017-10-18 ENCOUNTER — Encounter: Payer: Self-pay | Admitting: Vascular Surgery

## 2017-10-18 ENCOUNTER — Other Ambulatory Visit: Payer: Self-pay

## 2017-10-18 VITALS — BP 167/92 | HR 81 | Temp 97.1°F | Resp 16 | Ht 66.0 in | Wt 133.0 lb

## 2017-10-18 DIAGNOSIS — N186 End stage renal disease: Secondary | ICD-10-CM | POA: Diagnosis not present

## 2017-10-18 DIAGNOSIS — Z992 Dependence on renal dialysis: Secondary | ICD-10-CM

## 2017-10-18 DIAGNOSIS — D509 Iron deficiency anemia, unspecified: Secondary | ICD-10-CM | POA: Diagnosis not present

## 2017-10-18 DIAGNOSIS — M3210 Systemic lupus erythematosus, organ or system involvement unspecified: Secondary | ICD-10-CM | POA: Diagnosis not present

## 2017-10-18 DIAGNOSIS — N2581 Secondary hyperparathyroidism of renal origin: Secondary | ICD-10-CM | POA: Diagnosis not present

## 2017-10-18 DIAGNOSIS — D631 Anemia in chronic kidney disease: Secondary | ICD-10-CM | POA: Diagnosis not present

## 2017-10-18 NOTE — Progress Notes (Signed)
Vitals:   10/18/17 0831  BP: (!) 173/92  Pulse: 81  Resp: 16  Temp: (!) 97.1 F (36.2 C)  TempSrc: Oral  SpO2: 98%  Weight: 133 lb (60.3 kg)  Height: 5\' 6"  (1.676 m)

## 2017-10-18 NOTE — Progress Notes (Signed)
  Subjective:     Patient ID: [name], female   DOB: [birth year], 57 y.o.   MRN: [id number]  HPI 57 year old female follows up from recent revision of right upper extremity AV fistula where it was heavily calcified and there was minimal flow across a tight stenosis.  She is having some arm pain below the antecubitum her fistula is working well above the recent revision site.   Review of Systems Right upper extremity pain and numbness near the antecubitum.    Objective:   Physical Exam Awake alert and oriented Nonlabored respirations Strong thrill in AV fistula Palpable radial pulse Large pseudoaneurysm in cephalad aspect of AV fistula    Assessment/plan     57 year old female follows up after revision of right arm AV fistula.  Is okay to begin to cannulate the fistula.  She does have a large pseudoaneurysm more cephalad to our revision and may need this revised in the future.  We will allow her arm to heal first and she can be evaluated for further revision in the future.  She demonstrates good understanding.  Kristofer Schaffert C. Donzetta Matters, MD [facility] Office: [phone] Pager: [phone]

## 2017-10-19 DIAGNOSIS — N186 End stage renal disease: Secondary | ICD-10-CM | POA: Diagnosis not present

## 2017-10-19 DIAGNOSIS — I120 Hypertensive chronic kidney disease with stage 5 chronic kidney disease or end stage renal disease: Secondary | ICD-10-CM | POA: Diagnosis not present

## 2017-10-19 DIAGNOSIS — Z992 Dependence on renal dialysis: Secondary | ICD-10-CM | POA: Diagnosis not present

## 2017-10-21 ENCOUNTER — Inpatient Hospital Stay (HOSPITAL_COMMUNITY)
Admission: EM | Admit: 2017-10-21 | Discharge: 2017-10-25 | DRG: 640 | Disposition: A | Discharge status: Home / Self Care | Payer: Medicare Other | Attending: Family Medicine | Admitting: Family Medicine

## 2017-10-21 ENCOUNTER — Other Ambulatory Visit: Payer: Self-pay

## 2017-10-21 ENCOUNTER — Emergency Department (HOSPITAL_COMMUNITY): Payer: Medicare Other

## 2017-10-21 ENCOUNTER — Encounter (HOSPITAL_COMMUNITY): Payer: Self-pay | Admitting: *Deleted

## 2017-10-21 DIAGNOSIS — I080 Rheumatic disorders of both mitral and aortic valves: Secondary | ICD-10-CM | POA: Diagnosis present

## 2017-10-21 DIAGNOSIS — K219 Gastro-esophageal reflux disease without esophagitis: Secondary | ICD-10-CM | POA: Diagnosis present

## 2017-10-21 DIAGNOSIS — D649 Anemia, unspecified: Secondary | ICD-10-CM | POA: Diagnosis present

## 2017-10-21 DIAGNOSIS — J9601 Acute respiratory failure with hypoxia: Secondary | ICD-10-CM | POA: Diagnosis not present

## 2017-10-21 DIAGNOSIS — I160 Hypertensive urgency: Secondary | ICD-10-CM | POA: Diagnosis not present

## 2017-10-21 DIAGNOSIS — E8770 Fluid overload, unspecified: Principal | ICD-10-CM | POA: Diagnosis present

## 2017-10-21 DIAGNOSIS — N186 End stage renal disease: Secondary | ICD-10-CM | POA: Diagnosis not present

## 2017-10-21 DIAGNOSIS — Z79899 Other long term (current) drug therapy: Secondary | ICD-10-CM

## 2017-10-21 DIAGNOSIS — I5032 Chronic diastolic (congestive) heart failure: Secondary | ICD-10-CM | POA: Diagnosis present

## 2017-10-21 DIAGNOSIS — J189 Pneumonia, unspecified organism: Secondary | ICD-10-CM | POA: Diagnosis present

## 2017-10-21 DIAGNOSIS — R609 Edema, unspecified: Secondary | ICD-10-CM | POA: Diagnosis not present

## 2017-10-21 DIAGNOSIS — N2581 Secondary hyperparathyroidism of renal origin: Secondary | ICD-10-CM | POA: Diagnosis present

## 2017-10-21 DIAGNOSIS — I10 Essential (primary) hypertension: Secondary | ICD-10-CM | POA: Diagnosis not present

## 2017-10-21 DIAGNOSIS — Z8673 Personal history of transient ischemic attack (TIA), and cerebral infarction without residual deficits: Secondary | ICD-10-CM | POA: Diagnosis not present

## 2017-10-21 DIAGNOSIS — R0689 Other abnormalities of breathing: Secondary | ICD-10-CM | POA: Diagnosis not present

## 2017-10-21 DIAGNOSIS — Z992 Dependence on renal dialysis: Secondary | ICD-10-CM | POA: Diagnosis not present

## 2017-10-21 DIAGNOSIS — F1410 Cocaine abuse, uncomplicated: Secondary | ICD-10-CM | POA: Diagnosis present

## 2017-10-21 DIAGNOSIS — D631 Anemia in chronic kidney disease: Secondary | ICD-10-CM | POA: Diagnosis not present

## 2017-10-21 DIAGNOSIS — D539 Nutritional anemia, unspecified: Secondary | ICD-10-CM | POA: Diagnosis present

## 2017-10-21 DIAGNOSIS — E8779 Other fluid overload: Secondary | ICD-10-CM | POA: Diagnosis not present

## 2017-10-21 DIAGNOSIS — M329 Systemic lupus erythematosus, unspecified: Secondary | ICD-10-CM | POA: Diagnosis present

## 2017-10-21 DIAGNOSIS — I120 Hypertensive chronic kidney disease with stage 5 chronic kidney disease or end stage renal disease: Secondary | ICD-10-CM | POA: Diagnosis not present

## 2017-10-21 DIAGNOSIS — Z7982 Long term (current) use of aspirin: Secondary | ICD-10-CM

## 2017-10-21 DIAGNOSIS — R0902 Hypoxemia: Secondary | ICD-10-CM | POA: Diagnosis not present

## 2017-10-21 DIAGNOSIS — R0602 Shortness of breath: Secondary | ICD-10-CM | POA: Diagnosis not present

## 2017-10-21 DIAGNOSIS — I132 Hypertensive heart and chronic kidney disease with heart failure and with stage 5 chronic kidney disease, or end stage renal disease: Secondary | ICD-10-CM | POA: Diagnosis present

## 2017-10-21 DIAGNOSIS — F17210 Nicotine dependence, cigarettes, uncomplicated: Secondary | ICD-10-CM | POA: Diagnosis present

## 2017-10-21 HISTORY — DX: Pneumonia, unspecified organism: J18.9

## 2017-10-21 HISTORY — DX: Dependence on renal dialysis: Z99.2

## 2017-10-21 HISTORY — DX: End stage renal disease: N18.6

## 2017-10-21 HISTORY — DX: Personal history of other diseases of the digestive system: Z87.19

## 2017-10-21 LAB — CBC
HEMATOCRIT: 31.3 % — AB (ref 36.0–46.0)
HEMOGLOBIN: 9.6 g/dL — AB (ref 12.0–15.0)
MCH: 31.8 pg (ref 26.0–34.0)
MCHC: 30.7 g/dL (ref 30.0–36.0)
MCV: 103.6 fL — AB (ref 78.0–100.0)
Platelets: 249 10*3/uL (ref 150–400)
RBC: 3.02 MIL/uL — ABNORMAL LOW (ref 3.87–5.11)
RDW: 16.9 % — ABNORMAL HIGH (ref 11.5–15.5)
WBC: 7.8 10*3/uL (ref 4.0–10.5)

## 2017-10-21 LAB — PROCALCITONIN: Procalcitonin: 0.49 ng/mL

## 2017-10-21 LAB — BASIC METABOLIC PANEL
ANION GAP: 15 (ref 5–15)
BUN: 62 mg/dL — ABNORMAL HIGH (ref 6–20)
CO2: 27 mmol/L (ref 22–32)
Calcium: 9.1 mg/dL (ref 8.9–10.3)
Chloride: 99 mmol/L — ABNORMAL LOW (ref 101–111)
Creatinine, Ser: 10.33 mg/dL — ABNORMAL HIGH (ref 0.44–1.00)
GFR, EST AFRICAN AMERICAN: 4 mL/min — AB (ref >60)
GFR, EST NON AFRICAN AMERICAN: 4 mL/min — AB (ref >60)
Glucose, Bld: 115 mg/dL — ABNORMAL HIGH (ref 65–99)
POTASSIUM: 3.9 mmol/L (ref 3.5–5.1)
Sodium: 141 mmol/L (ref 135–145)

## 2017-10-21 LAB — I-STAT BETA HCG BLOOD, ED (MC, WL, AP ONLY): I-stat hCG, quantitative: <5 m[IU]/mL (ref <5)

## 2017-10-21 LAB — I-STAT CHEM 8, ED
BUN: 75 mg/dL — AB (ref 6–20)
Calcium, Ion: 1 mmol/L — ABNORMAL LOW (ref 1.15–1.40)
Chloride: 103 mmol/L (ref 101–111)
Creatinine, Ser: 9.9 mg/dL — ABNORMAL HIGH (ref 0.44–1.00)
Glucose, Bld: 130 mg/dL — ABNORMAL HIGH (ref 65–99)
HEMATOCRIT: 29 % — AB (ref 36.0–46.0)
HEMOGLOBIN: 9.9 g/dL — AB (ref 12.0–15.0)
POTASSIUM: 5.8 mmol/L — AB (ref 3.5–5.1)
SODIUM: 138 mmol/L (ref 135–145)
TCO2: 26 mmol/L (ref 22–32)

## 2017-10-21 LAB — I-STAT TROPONIN, ED: Troponin i, poc: 0.02 ng/mL (ref 0.00–0.08)

## 2017-10-21 MED ORDER — CLONIDINE HCL 0.2 MG PO TABS
0.2000 mg | ORAL_TABLET | Freq: Two times a day (BID) | ORAL | Status: DC
  Administered 2017-10-21 – 2017-10-22 (×2): 0.2 mg via ORAL
  Filled 2017-10-21 (×2): qty 1

## 2017-10-21 MED ORDER — SODIUM CHLORIDE 0.9% FLUSH
3.0000 mL | Freq: Two times a day (BID) | INTRAVENOUS | Status: DC
  Administered 2017-10-22: 3 mL via INTRAVENOUS

## 2017-10-21 MED ORDER — RENA-VITE PO TABS
1.0000 | ORAL_TABLET | Freq: Every day | ORAL | Status: DC
  Administered 2017-10-22 – 2017-10-25 (×3): 1 via ORAL
  Filled 2017-10-21 (×4): qty 1

## 2017-10-21 MED ORDER — ONDANSETRON HCL 4 MG/2ML IJ SOLN
4.0000 mg | Freq: Four times a day (QID) | INTRAMUSCULAR | Status: DC | PRN
  Administered 2017-10-24: 4 mg via INTRAVENOUS
  Filled 2017-10-21: qty 2

## 2017-10-21 MED ORDER — SODIUM CHLORIDE 0.9 % IV SOLN: 100.0000 mL | INTRAVENOUS | Status: DC | PRN

## 2017-10-21 MED ORDER — ONDANSETRON HCL 4 MG PO TABS
4.0000 mg | ORAL_TABLET | Freq: Four times a day (QID) | ORAL | Status: DC | PRN
  Administered 2017-10-25: 4 mg via ORAL
  Filled 2017-10-21: qty 1

## 2017-10-21 MED ORDER — HYDROCODONE-ACETAMINOPHEN 5-325 MG PO TABS: 1.0000 | ORAL_TABLET | Freq: Four times a day (QID) | ORAL | Status: DC | PRN

## 2017-10-21 MED ORDER — HEPARIN SODIUM (PORCINE) 1000 UNIT/ML DIALYSIS: 100.0000 [IU]/kg | INTRAMUSCULAR | Status: DC | PRN

## 2017-10-21 MED ORDER — ALTEPLASE 2 MG IJ SOLR: 2.0000 mg | Freq: Once | INTRAMUSCULAR | Status: DC | PRN

## 2017-10-21 MED ORDER — PENTAFLUOROPROP-TETRAFLUOROETH EX AERO: 1.0000 "application " | INHALATION_SPRAY | CUTANEOUS | Status: DC | PRN

## 2017-10-21 MED ORDER — PANTOPRAZOLE SODIUM 40 MG PO TBEC
40.0000 mg | DELAYED_RELEASE_TABLET | Freq: Every day | ORAL | Status: DC
  Administered 2017-10-22 – 2017-10-25 (×4): 40 mg via ORAL
  Filled 2017-10-21 (×4): qty 1

## 2017-10-21 MED ORDER — SODIUM CHLORIDE 0.9 % IV SOLN: 500.0000 mg | INTRAVENOUS | Status: DC

## 2017-10-21 MED ORDER — SODIUM CHLORIDE 0.9 % IV SOLN
1.0000 g | Freq: Three times a day (TID) | INTRAVENOUS | Status: DC
  Filled 2017-10-21 (×2): qty 1

## 2017-10-21 MED ORDER — SODIUM CHLORIDE 0.9 % IV SOLN: 250.0000 mL | INTRAVENOUS | Status: DC | PRN

## 2017-10-21 MED ORDER — LIDOCAINE HCL (PF) 1 % IJ SOLN: 5.0000 mL | INTRAMUSCULAR | Status: DC | PRN

## 2017-10-21 MED ORDER — LIDOCAINE-PRILOCAINE 2.5-2.5 % EX CREA: 1.0000 "application " | TOPICAL_CREAM | CUTANEOUS | Status: DC | PRN

## 2017-10-21 MED ORDER — SODIUM CHLORIDE 0.9% FLUSH: 3.0000 mL | INTRAVENOUS | Status: DC | PRN

## 2017-10-21 MED ORDER — ENALAPRILAT 1.25 MG/ML IV SOLN
0.6250 mg | Freq: Once | INTRAVENOUS | Status: AC
  Administered 2017-10-21: 0.625 mg via INTRAVENOUS
  Filled 2017-10-21: qty 1

## 2017-10-21 MED ORDER — ASPIRIN EC 81 MG PO TBEC
81.0000 mg | DELAYED_RELEASE_TABLET | Freq: Every day | ORAL | Status: DC
  Administered 2017-10-21 – 2017-10-24 (×4): 81 mg via ORAL
  Filled 2017-10-21 (×4): qty 1

## 2017-10-21 MED ORDER — HEPARIN SODIUM (PORCINE) 1000 UNIT/ML DIALYSIS: 1000.0000 [IU] | INTRAMUSCULAR | Status: DC | PRN

## 2017-10-21 MED ORDER — HYDRALAZINE HCL 20 MG/ML IJ SOLN
10.0000 mg | INTRAMUSCULAR | Status: DC | PRN
  Administered 2017-10-21 – 2017-10-24 (×2): 10 mg via INTRAVENOUS
  Filled 2017-10-21 (×2): qty 1

## 2017-10-21 MED ORDER — LABETALOL HCL 200 MG PO TABS
300.0000 mg | ORAL_TABLET | Freq: Three times a day (TID) | ORAL | Status: DC
  Administered 2017-10-21 – 2017-10-22 (×4): 300 mg via ORAL
  Filled 2017-10-21 (×4): qty 2

## 2017-10-21 MED ORDER — HEPARIN SODIUM (PORCINE) 5000 UNIT/ML IJ SOLN
5000.0000 [IU] | Freq: Three times a day (TID) | INTRAMUSCULAR | Status: DC
  Administered 2017-10-21 – 2017-10-24 (×9): 5000 [IU] via SUBCUTANEOUS
  Filled 2017-10-21 (×11): qty 1

## 2017-10-21 MED ORDER — LANTHANUM CARBONATE 500 MG PO CHEW
2000.0000 mg | CHEWABLE_TABLET | Freq: Three times a day (TID) | ORAL | Status: DC
  Administered 2017-10-22 – 2017-10-25 (×7): 2000 mg via ORAL
  Filled 2017-10-21 (×7): qty 4

## 2017-10-21 MED ORDER — SODIUM CHLORIDE 0.9 % IV SOLN
1.0000 g | INTRAVENOUS | Status: DC
  Administered 2017-10-21: 1 g via INTRAVENOUS
  Filled 2017-10-21 (×2): qty 1

## 2017-10-21 MED ORDER — ACETAMINOPHEN 650 MG RE SUPP: 650.0000 mg | Freq: Four times a day (QID) | RECTAL | Status: DC | PRN

## 2017-10-21 MED ORDER — ACETAMINOPHEN 500 MG PO TABS
1000.0000 mg | ORAL_TABLET | Freq: Once | ORAL | Status: AC
  Administered 2017-10-21: 1000 mg via ORAL
  Filled 2017-10-21: qty 2

## 2017-10-21 MED ORDER — NITROGLYCERIN 2 % TD OINT
1.0000 [in_us] | TOPICAL_OINTMENT | Freq: Once | TRANSDERMAL | Status: AC
  Administered 2017-10-21: 1 [in_us] via TOPICAL
  Filled 2017-10-21: qty 1

## 2017-10-21 MED ORDER — CHLORHEXIDINE GLUCONATE CLOTH 2 % EX PADS
6.0000 | MEDICATED_PAD | Freq: Every day | CUTANEOUS | Status: DC
  Administered 2017-10-23 – 2017-10-24 (×2): 6 via TOPICAL

## 2017-10-21 MED ORDER — ACETAMINOPHEN 325 MG PO TABS
650.0000 mg | ORAL_TABLET | Freq: Four times a day (QID) | ORAL | Status: DC | PRN
  Administered 2017-10-22 – 2017-10-25 (×3): 650 mg via ORAL
  Filled 2017-10-21 (×2): qty 2

## 2017-10-21 MED ORDER — LABETALOL HCL 5 MG/ML IV SOLN
20.0000 mg | Freq: Once | INTRAVENOUS | Status: AC
  Administered 2017-10-21: 20 mg via INTRAVENOUS
  Filled 2017-10-21: qty 4

## 2017-10-21 MED ORDER — LISINOPRIL 40 MG PO TABS
40.0000 mg | ORAL_TABLET | Freq: Two times a day (BID) | ORAL | Status: DC
  Administered 2017-10-21 – 2017-10-25 (×7): 40 mg via ORAL
  Filled 2017-10-21 (×2): qty 1
  Filled 2017-10-21: qty 2
  Filled 2017-10-21 (×2): qty 1
  Filled 2017-10-21 (×2): qty 2

## 2017-10-21 MED ORDER — SODIUM CHLORIDE 0.9 % IV SOLN
1250.0000 mg | Freq: Once | INTRAVENOUS | Status: AC
  Administered 2017-10-21: 1250 mg via INTRAVENOUS
  Filled 2017-10-21: qty 1250

## 2017-10-21 MED ORDER — SODIUM CHLORIDE 0.9% FLUSH
3.0000 mL | Freq: Two times a day (BID) | INTRAVENOUS | Status: DC
  Administered 2017-10-22 – 2017-10-24 (×5): 3 mL via INTRAVENOUS

## 2017-10-21 MED ORDER — AMLODIPINE BESYLATE 10 MG PO TABS
10.0000 mg | ORAL_TABLET | Freq: Every day | ORAL | Status: DC
  Administered 2017-10-21 – 2017-10-24 (×4): 10 mg via ORAL
  Filled 2017-10-21 (×4): qty 1

## 2017-10-21 NOTE — Progress Notes (Signed)
tx completed; unable to remove the 3L goal ordered because patient c/o cramping in chest, back, feet. Pt Sats dropped to 89-90% when non-rebreather mask was removed. Arville Lime made aware in report. Pt was suppoed to be discharged but stated that she is still SOB and as mention desatting. Pt returned to the ED after report was given. Transport delay also reported.

## 2017-10-21 NOTE — ED Notes (Signed)
Hospitalist Opyd, MD at bedside.

## 2017-10-21 NOTE — H&P (Signed)
History and Physical    Jaeda Bruso YBW:[id number] DOB: 02-May-1961 DOA: 10/21/2017  PCP: Benito Mccreedy, MD   Patient coming from: Home   Chief Complaint: SOB, cough, diarrhea   HPI: [name] is a 57 y.o. female with medical history significant for end-stage renal disease on hemodialysis, hypertension, and substance abuse, now presenting to the emergency department for evaluation of shortness of breath.  Patient denies missing any recent dialysis sessions, denies indiscretions with diet or fluid intake, and denies any chest pain or lower extremity swelling or tenderness.  Reports that she has had a productive cough for the past few days but no fevers or chills.  She woke early this morning with severe dyspnea and reports having 5 episodes of nonbloody diarrhea since that time.  She denies abdominal pain.  ED Course: Upon arrival to the ED, patient is found to be afebrile,  in acute respiratory distress with respiratory rate in the mid 30s, mild tachycardia, and blood pressure in the 190/100 range.  EKG features a sinus tachycardia with rate 111 and QTc interval of 507 ms.  Chest x-ray is notable for dense airspace opacity in the lower lobes bilaterally, worse on the right and concerning for pneumonia.  Chemistry panel is notable for a BUN of 62 with normal potassium and bicarbonate.  CBC features a microcytic anemia with hemoglobin of 9.6, similar to priors.  Troponin is normal.  Patient was treated with enalapril, labetalol, topical nitroglycerin, and acetaminophen in the ED.  Nephrology was consulted and the patient was emergently dialyzed with 1700 cc fluid off.  She reports some improvement with dialysis, but continues to be tachypneic and hypoxic.  She will be admitted for ongoing evaluation and management of acute hypoxic respiratory failure suspected secondary to pulmonary edema and/or HCAP.   Review of Systems:  All other systems reviewed and apart from HPI, are negative.  Past  Medical History:  Diagnosis Date  . Anemia   . Aortic stenosis 09/25/2016   Echo 07/24/16: Mod conc LVH, EF 60-65, no RWMA, Gr 2 DDd, bicuspid aortic valve, mild to mod AS (mean 18, peak 38), MAC, mod mitral stenosis (mean 9, peak 19), mild to mod MR, severe LAE, normal RVSF, mild RAE, mild TR  . Arthritis   . Blood transfusion '08   [facility]  . Chronic diastolic CHF (congestive heart failure) ([place])   . Dysfunctional uterine bleeding 12/19/2010  . ESRD (end stage renal disease) ([place])    dialysis   Mon Wed Fri  . GERD (gastroesophageal reflux disease)   . Headache   . Heart murmur   . Hemodialysis patient [facility])    right extremity port  . Hx of cardiovascular stress test    Lexiscan Myoview 4/16:  Normal stress nuclear study, EF 59%  . Hx of hiatal hernia   . Hypertension   . Lupus ([place])   . Mitral stenosis    Echo 4/16:  EF 55-60%, no RWMA, Gr 1 DD, mod MS (mean 9 mmHg), mod LAE, mild RAE, PASP 65, mod to severe TR, trivial eff  . Peptic ulcer disease   . Stroke [facility])    per patient "they said i had a small stroke but i couldnt even tell"    Past Surgical History:  Procedure Laterality Date  . COLONOSCOPY W/ BIOPSIES AND POLYPECTOMY    . DIALYSIS FISTULA CREATION  2007  . ENDOMETRIAL ABLATION    . ESOPHAGOGASTRODUODENOSCOPY N/A 07/31/2014   Procedure: ESOPHAGOGASTRODUODENOSCOPY (EGD);  Surgeon: Clarene Essex, MD;  [facility];  Service: Endoscopy;  Laterality: N/A;  . INSERTION OF ARTERIOVENOUS (AV) ARTEGRAFT ARM Right 09/26/2017   Procedure: INSERTION OF ARTERIOVENOUS (AV) ARTEGRAFT INTO RIGHT ARM;  Surgeon: Waynetta Sandy, MD;  Location: [place];  Service: Vascular;  Laterality: Right;  . REVISON OF ARTERIOVENOUS FISTULA Right 09/26/2017   Procedure: REVISION OF ARTERIOVENOUS FISTULA RIGHT ARM WITH ARTEGRAFT;  Surgeon: Waynetta Sandy, MD;  Location: [place];  Service: Vascular;  Laterality: Right;  . SHOULDER OPEN ROTATOR CUFF REPAIR Right 10/09/2016   Procedure:  ROTATOR CUFF REPAIR SHOULDER OPEN partial acrominectomy and extensive synovectomy;  Surgeon: Latanya [name], MD;  Location: [facility];  Service: Orthopedics;  Laterality: Right;  RNFA     reports that she has been smoking cigarettes.  She has been smoking about 0.25 packs per day. She has never used smokeless tobacco. She reports that she drinks alcohol. She reports that she has current or past drug history. Drug: Marijuana.  No Known Allergies  Family History  Problem Relation Age of Onset  . Liver cancer Maternal Grandmother   . Lymphoma Maternal Aunt   . Hypertension Mother   . Renal Disease Father   . Hypertension Father   . Heart attack Neg Hx      Prior to Admission medications   Medication Sig Start Date End Date Taking? Authorizing Provider  acetaminophen (TYLENOL) 500 MG tablet Take 1,000 mg by mouth as needed for mild pain.   Yes [provider]  amLODipine (NORVASC) 10 MG tablet Take 10 mg by mouth at bedtime.    Yes [provider]  aspirin EC 81 MG tablet Take 81 mg by mouth at bedtime.   Yes [provider]  cloNIDine (CATAPRES) 0.2 MG tablet Take 1 tablet (0.2 mg total) by mouth 2 (two) times daily. 09/25/16  Yes Weaver, Scott T, PA-C  HYDROcodone-acetaminophen (NORCO/VICODIN) 5-325 MG tablet Take 1 tablet by mouth every 6 (six) hours as needed for severe pain. 09/26/17  Yes Ulyses Amor, PA-C  labetalol (NORMODYNE) 300 MG tablet Take 300 mg by mouth 3 (three) times daily.    Yes [provider]  lanthanum (FOSRENOL) 1000 MG chewable tablet Chew 2 tablets (2,000 mg total) by mouth 3 (three) times daily with meals. 05/05/15  Yes Eugenie Filler, MD  lisinopril (PRINIVIL,ZESTRIL) 40 MG tablet Take 1 tablet (40 mg total) by mouth 2 (two) times daily. 01/30/17  Yes Aline August, MD  multivitamin (RENA-VIT) TABS tablet Take 1 tablet by mouth daily.   Yes [provider]  ondansetron (ZOFRAN) 4 MG tablet Take 1 tablet (4 mg total) by  mouth every 6 (six) hours. Patient taking differently: Take 4 mg by mouth every 6 (six) hours as needed for nausea or vomiting.  05/15/17  Yes Fawze, Mina A, PA-C  pantoprazole (PROTONIX) 40 MG tablet Take 1 tablet (40 mg total) by mouth 2 (two) times daily. Patient taking differently: Take 40 mg by mouth daily.  08/01/14  Yes Domenic Polite, MD    Physical Exam: Vitals:   10/21/17 1951 10/21/17 2004 10/21/17 2030 10/21/17 2036  BP:   (!) 191/113   Pulse: 98  (!) 109   Resp: 18  18   Temp:      TempSrc:      SpO2: 98% 96% 95% 98%  Weight:      Height:          Constitutional: Not in acute distress, tachypneic, dyspneic with speech  Eyes: PERTLA,  lids and conjunctivae normal ENMT: Mucous membranes are moist. Posterior pharynx clear of any exudate or lesions.   Neck: normal, supple, no masses, no thyromegaly Respiratory: Tachypnea, dyspnea with speech, rales bilaterally. No accessory muscle use.  Cardiovascular: S1 & S2 heard, regular rate and rhythm. No extremity edema.  Abdomen: No distension, no tenderness, soft. Bowel sounds normal.  Musculoskeletal: no clubbing / cyanosis. No joint deformity upper and lower extremities.   Skin: no significant rashes, lesions, ulcers. Warm, dry, well-perfused. Neurologic: No facial asymmetry. Sensation intact. Strength 5/5 in all 4 limbs.  Psychiatric: Alert and oriented x 3. Calm, cooperative.     Labs on Admission: I have personally reviewed following labs and imaging studies  CBC: Recent Labs  Lab 10/21/17 1013 10/21/17 1033  WBC 7.8  --   HGB 9.6* 9.9*  HCT 31.3* 29.0*  MCV 103.6*  --   PLT 249  --    Basic Metabolic Panel: Recent Labs  Lab 10/21/17 1033 10/21/17 1141  NA 138 141  K 5.8* 3.9  CL 103 99*  CO2  --  27  GLUCOSE 130* 115*  BUN 75* 62*  CREATININE 9.90* 10.33*  CALCIUM  --  9.1   GFR: Estimated Creatinine Clearance: 5.8 mL/min (A) (by C-G formula based on SCr of 10.33 mg/dL (H)). Liver Function  Tests: No results for input(s): AST, ALT, ALKPHOS, BILITOT, PROT, ALBUMIN in the last 168 hours. No results for input(s): LIPASE, AMYLASE in the last 168 hours. No results for input(s): AMMONIA in the last 168 hours. Coagulation Profile: No results for input(s): INR, PROTIME in the last 168 hours. Cardiac Enzymes: No results for input(s): CKTOTAL, CKMB, CKMBINDEX, TROPONINI in the last 168 hours. BNP (last 3 results) No results for input(s): PROBNP in the last 8760 hours. HbA1C: No results for input(s): HGBA1C in the last 72 hours. CBG: No results for input(s): GLUCAP in the last 168 hours. Lipid Profile: No results for input(s): CHOL, HDL, LDLCALC, TRIG, CHOLHDL, LDLDIRECT in the last 72 hours. Thyroid Function Tests: No results for input(s): TSH, T4TOTAL, FREET4, T3FREE, THYROIDAB in the last 72 hours. Anemia Panel: No results for input(s): VITAMINB12, FOLATE, FERRITIN, TIBC, IRON, RETICCTPCT in the last 72 hours. Urine analysis:    Component Value Date/Time   COLORURINE YELLOW 05/01/2015 2005   APPEARANCEUR CLEAR 05/01/2015 2005   LABSPEC 1.012 05/01/2015 2005   PHURINE 8.5 (H) 05/01/2015 2005   GLUCOSEU 100 (A) 05/01/2015 2005   HGBUR SMALL (A) 05/01/2015 2005   BILIRUBINUR NEGATIVE 05/01/2015 2005   KETONESUR NEGATIVE 05/01/2015 2005   PROTEINUR 100 (A) 05/01/2015 2005   UROBILINOGEN 0.2 07/27/2014 0150   NITRITE NEGATIVE 05/01/2015 2005   LEUKOCYTESUR NEGATIVE 05/01/2015 2005   Sepsis Labs: _0 (procalcitonin:4,lacticidven:4) )No results found for this or any previous visit (from the past 240 hour(s)).   Radiological Exams on Admission: Dg Chest Portable 1 View  Result Date: 10/21/2017 CLINICAL DATA:  Shortness of breath and diarrhea beginning at 2 a.m. this morning. EXAM: PORTABLE CHEST 1 VIEW COMPARISON:  PA and lateral chest 01/26/2017. FINDINGS: Bilateral lower lung zone airspace disease consistent with pneumonia is worse on the right. There is cardiomegaly.  No pneumothorax. No pleural effusion. No acute bony abnormality. IMPRESSION: Dense airspace disease in the lower lung zones bilaterally is worse on the right and most consistent with pneumonia. Electronically Signed   By: Inge Rise M.D.   On: 10/21/2017 10:28    EKG: Independently reviewed. Sinus tachycardia (rate 111), QTc 507 ms.  Assessment/Plan   1. Acute hypoxic respiratory failure; pulmonary edema   - Presents in acute respiratory distress with concern for pulmonary edema and was emergently dialyzed  - She denies missing any recent HD sessions, denies dietary indiscretions or wt gain   - Denies chest pain or leg swelling  - She remains hypoxic after HD with 1700 cc off, possibly from a concomitant PNA  - SLIV, fluid-restrict, investigate and tx possible PNA   2. HCAP  - Presents in acute respiratory distress, was emergently dialyzed with 1700 cc off, but she remains hypoxic  - Asymmetric opacities on CXR and recent productive cough suggest PNA, though no fever or leukocytosis in ED  - Check sputum culture, strep pneumo antigen, and procalcitonin  - Start empiric abx   3. Hypertension with hypertensive urgency  - BP improved following HD, remains elevated  - Continue Norvasc, clonidine, labetalol, and lisinopril    4. Macrocytic anemia  - Hgb is 9.6 on admission  - Appears to be stable, no active bleeding, likely related to ESRD    DVT prophylaxis: sq heparin  Code Status: Full  Family Communication: Discussed with patient Consults called: Nephrology Admission status: Inpatient    Vianne Bulls, MD [facility] Pager [phone]  If 7PM-7AM, please contact night-coverage [email or web address] Password [facility]  10/21/2017, 8:44 PM

## 2017-10-21 NOTE — ED Notes (Signed)
Pt states sob improved, though remains tachynaeic on nrb and hypertensive.

## 2017-10-21 NOTE — ED Notes (Signed)
Nephrology pa states pt to go to dialysis in 1 hour.

## 2017-10-21 NOTE — ED Provider Notes (Signed)
[place] EMERGENCY DEPARTMENT Provider Note   CSN: [id number] Arrival date & time: 10/21/17  1006     History   Chief Complaint Chief Complaint  Patient presents with  . Shortness of Breath    HPI [name] is a 57 y.o. female.  HPI Patient history of ESRD on dialysis.  Schedule is Monday Wednesday Friday.  Patient reports he does not have missed sessions.  Patient went to [facility] dialysis center this morning for her usual session, but was sent to the emergency department due to severe shortness of breath.  She reports he started getting very short of breath at 2 AM this morning.  No associated chest pain.  She reports she did start having frequent bowel movements and subsequently soft near diarrheal stool.  She reports she had some "tightness" in her upper abdomen but no pain.  Patient reports she felt nauseated but did not go on to have any vomiting.  She reports she felt at normal baseline at bedtime.  All symptoms were of acute onset. Past Medical History:  Diagnosis Date  . Anemia   . Aortic stenosis 09/25/2016   Echo 07/24/16: Mod conc LVH, EF 60-65, no RWMA, Gr 2 DDd, bicuspid aortic valve, mild to mod AS (mean 18, peak 38), MAC, mod mitral stenosis (mean 9, peak 19), mild to mod MR, severe LAE, normal RVSF, mild RAE, mild TR  . Arthritis   . Blood transfusion '08   [facility]  . Chronic diastolic CHF (congestive heart failure) ([place])   . Dysfunctional uterine bleeding 12/19/2010  . ESRD (end stage renal disease) ([place])    dialysis   Mon Wed Fri  . GERD (gastroesophageal reflux disease)   . Headache   . Heart murmur   . Hemodialysis patient [facility])    right extremity port  . Hx of cardiovascular stress test    Lexiscan Myoview 4/16:  Normal stress nuclear study, EF 59%  . Hx of hiatal hernia   . Hypertension   . Lupus ([place])   . Mitral stenosis    Echo 4/16:  EF 55-60%, no RWMA, Gr 1 DD, mod MS (mean 9 mmHg), mod LAE, mild RAE, PASP 65, mod to severe TR,  trivial eff  . Peptic ulcer disease   . Stroke [facility])    per patient "they said i had a small stroke but i couldnt even tell"    Patient Active Problem List   Diagnosis Date Noted  . Fluid overload 01/26/2017  . HTN (hypertension) 01/26/2017  . Rotator cuff tear arthropathy, right 10/09/2016  . Aortic stenosis 09/25/2016  . Leukocytosis   . Colitis 05/01/2015  . History of sepsis 05/01/2015  . Hypoxia 05/01/2015  . Mitral stenosis 08/30/2014  . End-stage renal disease on hemodialysis ([place]) 07/25/2014  . History of pulmonary edema 07/25/2014  . Lupus (systemic lupus erythematosus) ([place]) 07/25/2014  . DUB (dysfunctional uterine bleeding) 07/25/2014  . Anemia 07/25/2014  . Peptic ulcer disease 07/25/2014  . History of stroke 07/25/2014  . Hypertensive heart disease with CHF (congestive heart failure) ([place]) 12/19/2010  . Dialysis care 12/19/2010    Past Surgical History:  Procedure Laterality Date  . COLONOSCOPY W/ BIOPSIES AND POLYPECTOMY    . DIALYSIS FISTULA CREATION  2007  . ENDOMETRIAL ABLATION    . ESOPHAGOGASTRODUODENOSCOPY N/A 07/31/2014   Procedure: ESOPHAGOGASTRODUODENOSCOPY (EGD);  Surgeon: Clarene Essex, MD;  Location: [facility] ENDOSCOPY;  Service: Endoscopy;  Laterality: N/A;  . INSERTION OF ARTERIOVENOUS (AV) ARTEGRAFT ARM Right  09/26/2017   Procedure: INSERTION OF ARTERIOVENOUS (AV) ARTEGRAFT INTO RIGHT ARM;  Surgeon: Waynetta Sandy, MD;  Location: [place];  Service: Vascular;  Laterality: Right;  . REVISON OF ARTERIOVENOUS FISTULA Right 09/26/2017   Procedure: REVISION OF ARTERIOVENOUS FISTULA RIGHT ARM WITH ARTEGRAFT;  Surgeon: Waynetta Sandy, MD;  Location: [place];  Service: Vascular;  Laterality: Right;  . SHOULDER OPEN ROTATOR CUFF REPAIR Right 10/09/2016   Procedure: ROTATOR CUFF REPAIR SHOULDER OPEN partial acrominectomy and extensive synovectomy;  Surgeon: Latanya [name], MD;  [facility];  Service: Orthopedics;  Laterality: Right;  RNFA     OB  History   None      Home Medications    Prior to Admission medications   Medication Sig Start Date End Date Taking? Authorizing Provider  amLODipine (NORVASC) 10 MG tablet Take 10 mg by mouth at bedtime.     [provider]  aspirin EC 81 MG tablet Take 81 mg by mouth at bedtime.    [provider]  benzonatate (TESSALON) 100 MG capsule Take 1 capsule (100 mg total) by mouth 3 (three) times daily as needed for cough. Patient not taking: Reported on 10/18/2017 01/30/17   Aline August, MD  cloNIDine (CATAPRES) 0.2 MG tablet Take 1 tablet (0.2 mg total) by mouth 2 (two) times daily. 09/25/16   Richardson Dopp T, PA-C  HYDROcodone-acetaminophen (NORCO/VICODIN) 5-325 MG tablet Take 1 tablet by mouth every 6 (six) hours as needed for severe pain. 09/26/17   Ulyses Amor, PA-C  labetalol (NORMODYNE) 300 MG tablet Take 300 mg by mouth 3 (three) times daily.     [provider]  lanthanum (FOSRENOL) 1000 MG chewable tablet Chew 2 tablets (2,000 mg total) by mouth 3 (three) times daily with meals. 05/05/15   Eugenie Filler, MD  lisinopril (PRINIVIL,ZESTRIL) 40 MG tablet Take 1 tablet (40 mg total) by mouth 2 (two) times daily. 01/30/17   Aline August, MD  multivitamin (RENA-VIT) TABS tablet Take 1 tablet by mouth daily.    [provider]  ondansetron (ZOFRAN) 4 MG tablet Take 1 tablet (4 mg total) by mouth every 6 (six) hours. Patient taking differently: Take 4 mg by mouth every 6 (six) hours as needed for nausea or vomiting.  05/15/17   Nils Flack, Mina A, PA-C  pantoprazole (PROTONIX) 40 MG tablet Take 1 tablet (40 mg total) by mouth 2 (two) times daily. Patient taking differently: Take 40 mg by mouth daily.  08/01/14   Domenic Polite, MD    Family History Family History  Problem Relation Age of Onset  . Liver cancer Maternal Grandmother   . Lymphoma Maternal Aunt   . Hypertension Mother   . Renal Disease Father   . Hypertension Father   . Heart attack Neg  Hx     Social History Social History   Tobacco Use  . Smoking status: Current Some Day Smoker    Packs/day: 0.25    Types: Cigarettes  . Smokeless tobacco: Never Used  Substance Use Topics  . Alcohol use: Yes    Alcohol/week: 0.0 oz    Comment: occasional  . Drug use: Yes    Types: Marijuana    Comment: occasionally     Allergies   Patient has no known allergies.   Review of Systems Review of Systems 10 Systems reviewed and are negative for acute change except as noted in the HPI.  Physical Exam Updated Vital Signs Pulse (!) 117   Temp (!) 97.4  F (36.3 C) (Oral)   Resp (!) 36   Ht _0  (1.676 m)   Wt 60.3 kg (133 lb)   LMP 12/05/2010 (LMP Unknown)   SpO2 96%   BMI 21.47 kg/m   Physical Exam  Constitutional: She is oriented to person, place, and time.  Patient has moderate to severe respiratory distress.  Her mental status is clear.  She is sitting upright in a stretcher holding her nonrebreather mask on.  Not diaphoretic.  HENT:  Head: Normocephalic and atraumatic.  Mouth/Throat: Oropharynx is clear and moist.  Eyes: EOM are normal.  Cardiovascular:  Tachycardia.  2\6 systolic ejection murmur.  Pulmonary/Chest:  Moderate to severe increased work of breathing.  Patient can speak in short full sentences.  Crackles to two thirds of lung fields.  Abdominal: Soft. She exhibits no distension. There is no tenderness. There is no guarding.  Musculoskeletal: Normal range of motion. She exhibits no tenderness.  Trace edema lower extremities.  Calves are soft and nontender.  Neurological: She is alert and oriented to person, place, and time. She exhibits normal muscle tone. Coordination normal.  Skin: Skin is warm and dry.  Psychiatric: She has a normal mood and affect.     ED Treatments / Results  Labs (all labs ordered are listed, but only abnormal results are displayed) Labs Reviewed  I-STAT CHEM 8, ED - Abnormal; Notable for the following components:       Result Value   Potassium 5.8 (*)    BUN 75 (*)    Creatinine, Ser 9.90 (*)    Glucose, Bld 130 (*)    Calcium, Ion 1.00 (*)    Hemoglobin 9.9 (*)    HCT 29.0 (*)    All other components within normal limits  BASIC METABOLIC PANEL  CBC  I-STAT TROPONIN, ED  I-STAT BETA HCG BLOOD, ED ([facility], [facility], [facility] ONLY)    EKG None  Radiology Dg Chest Portable 1 View  Result Date: 10/21/2017 CLINICAL DATA:  Shortness of breath and diarrhea beginning at 2 a.m. this morning. EXAM: PORTABLE CHEST 1 VIEW COMPARISON:  PA and lateral chest 01/26/2017. FINDINGS: Bilateral lower lung zone airspace disease consistent with pneumonia is worse on the right. There is cardiomegaly. No pneumothorax. No pleural effusion. No acute bony abnormality. IMPRESSION: Dense airspace disease in the lower lung zones bilaterally is worse on the right and most consistent with pneumonia. Electronically Signed   By: Inge Rise M.D.   On: 10/21/2017 10:28    Procedures Procedures (including critical care time) CRITICAL CARE Performed by: Si Gaul   Total critical care time: 30 minutes  Critical care time was exclusive of separately billable procedures and treating other patients.  Critical care was necessary to treat or prevent imminent or life-threatening deterioration.  Critical care was time spent personally by me on the following activities: development of treatment plan with patient and/or surrogate as well as nursing, discussions with consultants, evaluation of patient's response to treatment, examination of patient, obtaining history from patient or surrogate, ordering and performing treatments and interventions, ordering and review of laboratory studies, ordering and review of radiographic studies, pulse oximetry and re-evaluation of patient's condition.  Medications Ordered in ED Medications  enalaprilat (VASOTEC) injection 0.625 mg (has no administration in time range)  labetalol (NORMODYNE,TRANDATE)  injection 20 mg (20 mg Intravenous Given 10/21/17 1022)     Initial Impression / Assessment and Plan / ED Course  I have reviewed the triage vital signs and the nursing notes.  Pertinent labs & imaging results that were available during my care of the patient were reviewed by me and considered in my medical decision making (see chart for details).  Clinical Course as of Oct 22 1034  Mon Oct 21, 2017  1028 Consulted Dr. [place] of nephrology.  Advises they will come to see the patient in the emergency department and arrange dialysis.  Patient is known to him.  He advised that she is chronically severely hypertensive due to medication noncompliance.  Patient also has cocaine abuse history.  Does not suggest parameters for blood pressure but may use some labetalol as patient is routinely prescribed.   [MP]    Clinical Course User Index [MP] Charlesetta Shanks, MD     Final Clinical Impressions(s) / ED Diagnoses   Final diagnoses:  Acute respiratory failure with hypoxia ([place])  ESRD needing dialysis [facility])   Patient presents outlined above with acute onset of severe shortness of breath.  Chest x-ray reviewed by myself as pulmonary edema.  Physical examination positive for diffuse crackles.  Findings consistent with acute volume overload.  Nephrology has been consulted with plan for in ED consultation for emergent dialysis.  Patient does feel improved with nonrebreather mask.  Oxygen saturation high 90s on mask.  At this time, patient is refusing BiPAP stating that she has claustrophobia and has had difficulty tolerating in the past.  She would like to continue with the nonrebreather mask at this time.  Will continue to trial the nonrebreather and initiate some hypertension control with labetalol.  We will continue to monitor closely until nephrology has made arrangements for definitive management. ED Discharge Orders    None       Charlesetta Shanks, MD 10/21/17 1038

## 2017-10-21 NOTE — Procedures (Signed)
I was present at this session.  I have reviewed the session itself and made appropriate changes.  BP ^, vol xs. tol removal thus far.  Access press ok.  JD  [name] Quamir Willemsen 6/3/20192:07 PM

## 2017-10-21 NOTE — Progress Notes (Signed)
RT came to assess patient. Patient currently on NRB sat 100%. Patient in no distress at this time. RT placed patient on 4L [place]. Patient states she is breathing fine with the [place]. Patient states she has worn BIPAP before and she says she does not need it at this time. RT will continue to monitor.

## 2017-10-21 NOTE — ED Notes (Signed)
Dr. Rex Kras at bedside evaluating pt. , and explained plan of care to pt.

## 2017-10-21 NOTE — ED Notes (Signed)
Respiratory therapist at bedside evaluating pt. , pt.stated improvement on her breathing .

## 2017-10-21 NOTE — ED Triage Notes (Addendum)
Pt here from dialysis via [facility] for sob.  Pt stated sob and diarrhea x 5 since 2 am.  Was NOT dialysed d/t sob.  Sats of 89% on RA.  Placed on NRB with sats of 100%.  RR of 36, sats of 96% on nrb.  C/o epigastric pain.

## 2017-10-21 NOTE — Progress Notes (Addendum)
Pharmacy Antibiotic Note  [name] is a 57 y.o. female with ESRD- MWF dialysis admitted on 10/21/2017 from the dialysis center with severe shortness of breath and concern for pneumonia.  Pharmacy has been consulted for Vancomycin dosing. Patient is also on Cefepime. WBC is within normal limits and she is afebrile at this time. Patient states she did not receive antibiotics at the dialysis center. Had HD here for full session this afternoon.   Plan: Vancomycin 1250 mg IV x1 now.  Then Vancomycin 500 mg IV post-HD.   Adjust Cefepime to 1g IV every 24 hours.  Monitor renal function, culture results, and clinical status  Height: 5\' 6"  (167.6 cm) Weight: (pt on a stretcher bed) IBW/kg (Calculated) : 59.3  Temp (24hrs), Avg:97.5 F (36.4 C), Min:97.4 F (36.3 C), Max:97.6 F (36.4 C)  Recent Labs  Lab 10/21/17 1013 10/21/17 1033 10/21/17 1141  WBC 7.8  --   --   CREATININE  --  9.90* 10.33*    Estimated Creatinine Clearance: 5.8 mL/min (A) (by C-G formula based on SCr of 10.33 mg/dL (H)).    No Known Allergies - confirmed with patient  Antimicrobials this admission: Vancomycin 6/3 >> Cefepime 6/3 >>  Dose adjustments this admission:  Microbiology results: 6/3 Blood >> 6/3 Sputum >>  Thank you for allowing pharmacy to be a part of this patient's care.  Sloan Leiter, PharmD, BCPS, BCCCP Clinical Pharmacist Clinical phone 10/21/2017 until 3:30PM [phone] After hours, please call [phone] 10/21/2017 8:59 PM

## 2017-10-22 LAB — CBC WITH DIFFERENTIAL/PLATELET
ABS IMMATURE GRANULOCYTES: 0 10*3/uL (ref 0.0–0.1)
Basophils Absolute: 0 10*3/uL (ref 0.0–0.1)
Basophils Relative: 1 %
Eosinophils Absolute: 0.1 10*3/uL (ref 0.0–0.7)
Eosinophils Relative: 1 %
HCT: 26.7 % — ABNORMAL LOW (ref 36.0–46.0)
HEMOGLOBIN: 8.2 g/dL — AB (ref 12.0–15.0)
IMMATURE GRANULOCYTES: 1 %
LYMPHS ABS: 0.8 10*3/uL (ref 0.7–4.0)
LYMPHS PCT: 16 %
MCH: 31.3 pg (ref 26.0–34.0)
MCHC: 30.7 g/dL (ref 30.0–36.0)
MCV: 101.9 fL — AB (ref 78.0–100.0)
MONOS PCT: 7 %
Monocytes Absolute: 0.4 10*3/uL (ref 0.1–1.0)
NEUTROS ABS: 3.6 10*3/uL (ref 1.7–7.7)
NEUTROS PCT: 74 %
Platelets: 183 10*3/uL (ref 150–400)
RBC: 2.62 MIL/uL — ABNORMAL LOW (ref 3.87–5.11)
RDW: 16 % — ABNORMAL HIGH (ref 11.5–15.5)
WBC: 4.9 10*3/uL (ref 4.0–10.5)

## 2017-10-22 LAB — BASIC METABOLIC PANEL
ANION GAP: 15 (ref 5–15)
BUN: 22 mg/dL — ABNORMAL HIGH (ref 6–20)
CHLORIDE: 95 mmol/L — AB (ref 101–111)
CO2: 27 mmol/L (ref 22–32)
Calcium: 9 mg/dL (ref 8.9–10.3)
Creatinine, Ser: 5.1 mg/dL — ABNORMAL HIGH (ref 0.44–1.00)
GFR calc non Af Amer: 9 mL/min — ABNORMAL LOW (ref >60)
GFR, EST AFRICAN AMERICAN: 10 mL/min — AB (ref >60)
GLUCOSE: 103 mg/dL — AB (ref 65–99)
POTASSIUM: 3.2 mmol/L — AB (ref 3.5–5.1)
Sodium: 137 mmol/L (ref 135–145)

## 2017-10-22 LAB — HIV ANTIBODY (ROUTINE TESTING W REFLEX): HIV Screen 4th Generation wRfx: NONREACTIVE

## 2017-10-22 LAB — MRSA PCR SCREENING: MRSA by PCR: NEGATIVE

## 2017-10-22 LAB — STREP PNEUMONIAE URINARY ANTIGEN: Strep Pneumo Urinary Antigen: NEGATIVE

## 2017-10-22 LAB — PROCALCITONIN: Procalcitonin: 0.5 ng/mL

## 2017-10-22 MED ORDER — CLONIDINE HCL 0.2 MG PO TABS
0.2000 mg | ORAL_TABLET | Freq: Three times a day (TID) | ORAL | Status: DC
  Administered 2017-10-22: 0.2 mg via ORAL
  Filled 2017-10-22: qty 1

## 2017-10-22 MED ORDER — ISOSORBIDE MONONITRATE 20 MG PO TABS
10.0000 mg | ORAL_TABLET | Freq: Two times a day (BID) | ORAL | Status: DC
  Administered 2017-10-22 – 2017-10-24 (×3): 10 mg via ORAL
  Filled 2017-10-22 (×5): qty 1

## 2017-10-22 MED ORDER — HYDROCODONE-ACETAMINOPHEN 5-325 MG PO TABS: 1.0000 | ORAL_TABLET | Freq: Four times a day (QID) | ORAL | Status: DC | PRN

## 2017-10-22 NOTE — ED Provider Notes (Signed)
Pt brought back down from dialysis due to ongoing hypoxia. On exam, she was on NRB but 100%, anxious but no severe increased WOB. She was able to be weaned to venti-mask at 30%. I was informed by nursing that she only had half session due to muscle cramping and I suspect she still has pulmonary edema/ volume overload especially given elevated BP. Discussed admission w/ hospitalist, Dr. Myna Hidalgo, and pt admitted for further care.   Little, Wenda Overland, MD 10/22/17 [phone]

## 2017-10-22 NOTE — Progress Notes (Signed)
[name] - Stepdown/ICU TEAM  [name]  VWU:[id number] DOB: Sep 29, 1960 DOA: 10/21/2017 PCP: Benito Mccreedy, MD    Brief Narrative:  57yo F w/ a hx of ESRD, HTN, AoS, SLE, and substance abuse (cocaine + Sept 2018) who presented to the ED w/ shortness of breath.    Upon arrival the patient was found to be in acute respiratory distress with respiratory rate in the mid 30s, mild tachycardia, and blood pressure in the 190/100 range.  EKG features a sinus tachycardia with rate 111 and QTc interval of 507 ms.  CXR was notable for dense airspace opacity in the lower lobes B concerning for pneumonia.  Nephrology was consulted and the patient was emergently dialyzed with 1700 cc fluid off.  She continued to be tachypneic and hypoxic.    Significant Events: 6/3 admit - emergent HD   Subjective: Pt feels much less SOB.  She denies cp, n/v, or abdom pain.  Her BP remains poorly controlled.    Assessment & Plan:  Acute hypoxic respiratory failure due to pulmonary edema   denied missing HD sessions or dietary indiscretions - remained hypoxic after HD with 1700 cc off, but is now saturating 100% on RA - appears to have been due to pulmonary edema likely due to malignant HTN - adjust BP meds and follow   ?HCAP   Being tx w/ empiric abx - WBC normal - pt afebrile - I will stop abx as I find no clinical evidence to suggests an active infection at this time - follow  Hypertension with hypertensive urgency  BP remains poorly controlled - BP meds adjusted - will need to f/u after HD   Macrocytic anemia  Hgb stable - no active bleeding  DVT prophylaxis: SQ heparin  Code Status: FULL CODE Family Communication: no family present at time of exam  Disposition Plan: SDU as pt may require gtt for BP control   Consultants:  Nephrology   Antimicrobials:  Cefepime 6/3 > 6/4 Vanc 6/3 > 6/4  Objective: Blood pressure (!) 173/107, pulse 96, temperature 99.2 F (37.3 C), temperature  source Oral, resp. rate (!) 24, height 5\' 6"  (1.676 m), weight 58.5 kg (128 lb 14.4 oz), last menstrual period 12/05/2010, SpO2 100 %.  Intake/Output Summary (Last 24 hours) at 10/22/2017 1725 Last data filed at 10/22/2017 0557 Gross per 24 hour  Intake 480 ml  Output 1799 ml  Net -1319 ml   Filed Weights   10/21/17 1010 10/21/17 2237 10/22/17 0351  Weight: 60.3 kg (133 lb) 59.2 kg (130 lb 9.6 oz) 58.5 kg (128 lb 14.4 oz)    Examination: General: No acute respiratory distress at rest in bed  Lungs: blunting of breath sounds in B bases - no wheezing  Cardiovascular: Regular rate and rhythm without murmur gallop or rub normal S1 and S2 Abdomen: Nontender, nondistended, soft, bowel sounds positive, no rebound, no ascites, no appreciable mass Extremities: No significant cyanosis, clubbing, or edema bilateral lower extremities  CBC: Recent Labs  Lab 10/21/17 1013 10/21/17 1033 10/22/17 0447  WBC 7.8  --  4.9  NEUTROABS  --   --  3.6  HGB 9.6* 9.9* 8.2*  HCT 31.3* 29.0* 26.7*  MCV 103.6*  --  101.9*  PLT 249  --  295   Basic Metabolic Panel: Recent Labs  Lab 10/21/17 1033 10/21/17 1141 10/22/17 0447  NA 138 141 137  K 5.8* 3.9 3.2*  CL 103 99* 95*  CO2  --  27 27  GLUCOSE 130* 115* 103*  BUN 75* 62* 22*  CREATININE 9.90* 10.33* 5.10*  CALCIUM  --  9.1 9.0   GFR: Estimated Creatinine Clearance: 11.5 mL/min (A) (by C-G formula based on SCr of 5.1 mg/dL (H)).  Liver Function Tests: No results for input(s): AST, ALT, ALKPHOS, BILITOT, PROT, ALBUMIN in the last 168 hours. No results for input(s): LIPASE, AMYLASE in the last 168 hours. No results for input(s): AMMONIA in the last 168 hours.   Recent Results (from the past 240 hour(s))  Culture, blood (routine x 2) Call MD if unable to obtain prior to antibiotics being given     Status: None (Preliminary result)   Collection Time: 10/21/17  9:05 PM  Result Value Ref Range Status   Specimen Description BLOOD BLOOD LEFT  FOREARM  Final   Special Requests   Final    BOTTLES DRAWN AEROBIC AND ANAEROBIC Blood Culture adequate volume   Culture   Final    NO GROWTH < 24 HOURS Performed at [facility], [address] [place], [place] [address]    Report Status PENDING  Incomplete  Culture, blood (routine x 2) Call MD if unable to obtain prior to antibiotics being given     Status: None (Preliminary result)   Collection Time: 10/21/17  9:10 PM  Result Value Ref Range Status   Specimen Description BLOOD LEFT HAND  Final   Special Requests   Final    BOTTLES DRAWN AEROBIC AND ANAEROBIC Blood Culture adequate volume   Culture   Final    NO GROWTH < 24 HOURS Performed at [facility], [place] [address] [place], [place] [address]    Report Status PENDING  Incomplete  MRSA PCR Screening     Status: None   Collection Time: 10/21/17 10:27 PM  Result Value Ref Range Status   MRSA by PCR NEGATIVE NEGATIVE Final    Comment:        The GeneXpert MRSA Assay (FDA approved for NASAL specimens only), is one component of a comprehensive MRSA colonization surveillance program. It is not intended to diagnose MRSA infection nor to guide or monitor treatment for MRSA infections. Performed at [facility], [place] [address], [place], [place] [address]      Scheduled Meds: . amLODipine  10 mg Oral QHS  . aspirin EC  81 mg Oral QHS  . Chlorhexidine Gluconate Cloth  6 each Topical Q0600  . cloNIDine  0.2 mg Oral BID  . heparin  5,000 Units Subcutaneous Q8H  . labetalol  300 mg Oral TID  . lanthanum  2,000 mg Oral TID WC  . lisinopril  40 mg Oral BID  . multivitamin  1 tablet Oral Daily  . pantoprazole  40 mg Oral Daily  . sodium chloride flush  3 mL Intravenous Q12H  . sodium chloride flush  3 mL Intravenous Q12H     LOS: 1 day   Cherene Altes, MD [facility] Office  [phone] Pager - Text Page per Shea Evans as per below:  On-Call/Text Page:      [email or web address]      password  [email or web address]  If 7PM-7AM, please contact night-coverage [email or web address] Password [email or web address] 10/22/2017, 5:25 PM

## 2017-10-22 NOTE — Progress Notes (Signed)
Pt transitioned off Venti mask to [place] at 2L. Sats remaining 93-96%. Pt is in no distress. Will continue to monitor. Jessie Foot, RN

## 2017-10-23 ENCOUNTER — Other Ambulatory Visit: Payer: Self-pay

## 2017-10-23 ENCOUNTER — Encounter (HOSPITAL_COMMUNITY): Payer: Self-pay | Admitting: General Practice

## 2017-10-23 DIAGNOSIS — I160 Hypertensive urgency: Secondary | ICD-10-CM

## 2017-10-23 DIAGNOSIS — I10 Essential (primary) hypertension: Secondary | ICD-10-CM

## 2017-10-23 LAB — CBC
HEMATOCRIT: 24.8 % — AB (ref 36.0–46.0)
Hemoglobin: 7.7 g/dL — ABNORMAL LOW (ref 12.0–15.0)
MCH: 31.8 pg (ref 26.0–34.0)
MCHC: 31 g/dL (ref 30.0–36.0)
MCV: 102.5 fL — AB (ref 78.0–100.0)
Platelets: 163 10*3/uL (ref 150–400)
RBC: 2.42 MIL/uL — ABNORMAL LOW (ref 3.87–5.11)
RDW: 15.6 % — AB (ref 11.5–15.5)
WBC: 3.9 10*3/uL — AB (ref 4.0–10.5)

## 2017-10-23 LAB — RENAL FUNCTION PANEL
Albumin: 2.9 g/dL — ABNORMAL LOW (ref 3.5–5.0)
Anion gap: 12 (ref 5–15)
BUN: 39 mg/dL — ABNORMAL HIGH (ref 6–20)
CHLORIDE: 99 mmol/L — AB (ref 101–111)
CO2: 26 mmol/L (ref 22–32)
Calcium: 8.5 mg/dL — ABNORMAL LOW (ref 8.9–10.3)
Creatinine, Ser: 7.72 mg/dL — ABNORMAL HIGH (ref 0.44–1.00)
GFR calc Af Amer: 6 mL/min — ABNORMAL LOW (ref >60)
GFR, EST NON AFRICAN AMERICAN: 5 mL/min — AB (ref >60)
Glucose, Bld: 90 mg/dL (ref 65–99)
POTASSIUM: 3.3 mmol/L — AB (ref 3.5–5.1)
Phosphorus: 6.1 mg/dL — ABNORMAL HIGH (ref 2.5–4.6)
Sodium: 137 mmol/L (ref 135–145)

## 2017-10-23 LAB — PREPARE RBC (CROSSMATCH)

## 2017-10-23 MED ORDER — DARBEPOETIN ALFA 200 MCG/0.4ML IJ SOSY
200.0000 ug | PREFILLED_SYRINGE | INTRAMUSCULAR | Status: DC
Start: 2017-10-30 — End: 2017-10-24

## 2017-10-23 MED ORDER — LIDOCAINE HCL (PF) 1 % IJ SOLN: 5.0000 mL | INTRAMUSCULAR | Status: DC | PRN

## 2017-10-23 MED ORDER — LIDOCAINE-PRILOCAINE 2.5-2.5 % EX CREA: 1.0000 "application " | TOPICAL_CREAM | CUTANEOUS | Status: DC | PRN

## 2017-10-23 MED ORDER — HEPARIN SODIUM (PORCINE) 1000 UNIT/ML DIALYSIS: 100.0000 [IU]/kg | INTRAMUSCULAR | Status: DC | PRN

## 2017-10-23 MED ORDER — HEPARIN SODIUM (PORCINE) 1000 UNIT/ML DIALYSIS: 1000.0000 [IU] | INTRAMUSCULAR | Status: DC | PRN

## 2017-10-23 MED ORDER — CALCITRIOL 0.25 MCG PO CAPS
1.7500 ug | ORAL_CAPSULE | ORAL | Status: DC
  Administered 2017-10-23 – 2017-10-25 (×2): 1.75 ug via ORAL
  Filled 2017-10-23 (×3): qty 1

## 2017-10-23 MED ORDER — CHLORHEXIDINE GLUCONATE CLOTH 2 % EX PADS: 6.0000 | MEDICATED_PAD | Freq: Every day | CUTANEOUS | Status: DC

## 2017-10-23 MED ORDER — LABETALOL HCL 200 MG PO TABS
600.0000 mg | ORAL_TABLET | Freq: Three times a day (TID) | ORAL | Status: DC
Start: 2017-10-23 — End: 2017-10-25
  Administered 2017-10-23 – 2017-10-25 (×6): 600 mg via ORAL
  Filled 2017-10-23 (×6): qty 3

## 2017-10-23 MED ORDER — SODIUM CHLORIDE 0.9 % IV SOLN: Freq: Once | INTRAVENOUS | Status: DC

## 2017-10-23 MED ORDER — ALTEPLASE 2 MG IJ SOLR: 2.0000 mg | Freq: Once | INTRAMUSCULAR | Status: DC | PRN

## 2017-10-23 MED ORDER — SODIUM CHLORIDE 0.9 % IV SOLN: 100.0000 mL | INTRAVENOUS | Status: DC | PRN

## 2017-10-23 MED ORDER — PENTAFLUOROPROP-TETRAFLUOROETH EX AERO: 1.0000 "application " | INHALATION_SPRAY | CUTANEOUS | Status: DC | PRN

## 2017-10-23 NOTE — Consult Note (Signed)
Reason for Consult:ESRD, HTN Referring Physician: Dr. Sherral Hammers  [name] is an 57 y.o. female.  HPI: 26yr[place] and got HD for SOB, and was to go home but apparently subsequently admitted for concern of pneu.  We were not informed.  Cough, now better not SOB. No chills. Had N, V yest x 1 .  No edema, CP, or PND. Reg HD MWF. Hx polysubstance abuse. Constitutional: weak with chronic anemia Eyes: negative Ears, nose, mouth, throat, and face: negative Respiratory: cough but better, little sputum Cardiovascular: bp ^ Gastrointestinal: as above, D prior to admit Genitourinary:negative Integument/breast: negative Hematologic/lymphatic: anemia ,weak Musculoskeletal:arth shoulders, op on R Neurological: negative Allergic/Immunologic: negative  [facility]. Primary Nephrologist [name]. [name] 59.5 kg. HD Bath 2K/2 Ca, Dialyzer 180NR, Heparin 5000 Units pre . Access  RUA AVF.  Past Medical History:  Diagnosis Date  . Anemia   . Aortic stenosis 09/25/2016   Echo 07/24/16: Mod conc LVH, EF 60-65, no RWMA, Gr 2 DDd, bicuspid aortic valve, mild to mod AS (mean 18, peak 38), MAC, mod mitral stenosis (mean 9, peak 19), mild to mod MR, severe LAE, normal RVSF, mild RAE, mild TR  . Arthritis   . [facility] . [place] diastolic [place]ive [place] failure[place]   . Dysfunctional uterine bleeding 12/19/2010  . [place]    dialysis   Mon Wed Fri  . GERD (gastroesophageal reflux disease)   . Headache   . Heart murmur   . Hemodialysis patient ([facility]    right extremity port  . Hx of cardiovascular stress test    Lexiscan Myoview 4/16:  Normal stress nuclear study, EF 59%  . Hx of hiatal hernia   . Hypertension   . Lupus ([place]   . Mitral stenosis    Echo 4/16:  EF 55-60%, no RWMA, Gr 1 DD, mod MS (mean 9 mmHg), mod LAE, mild RAE, PASP 65, mod to severe TR, trivial eff  . Peptic ulcer disease   .  Stroke ([name]    per patient "they said i had a small stroke but i couldnt even tell"    Past Surgical History:  Procedure Laterality Date  . COLONOSCOPY W/ BIOPSIES AND POLYPECTOMY    . DIALYSIS FISTULA CREATION  2007  . ENDOMETRIAL ABLATION    . ESOPHAGOGASTRODUODENOSCOPY N/A 07/31/2014   Procedure: ESOPHAGOGASTRODUODENOSCOPY (EGD);  Surgeon: MClarene Essex MD;  [facility]  Service: Endoscopy;  Laterality: N/A;  . INSERTION OF ARTERIOVENOUS (AV) ARTEGRAFT ARM Right 09/26/2017   Procedure: INSERTION OF ARTERIOVENOUS (AV) ARTEGRAFT INTO RIGHT ARM;  Surgeon: CWaynetta Sandy MD;  [place]  Service: Vascular;  Laterality: Right;  . REVISON OF ARTERIOVENOUS FISTULA Right 09/26/2017   Procedure: REVISION OF ARTERIOVENOUS FISTULA RIGHT ARM WITH ARTEGRAFT;  Surgeon: CWaynetta Sandy MD;  [place]  Service: Vascular;  Laterality: Right;  . SHOULDER OPEN ROTATOR CUFF REPAIR Right 10/09/2016   Procedure: ROTATOR CUFF REPAIR SHOULDER OPEN partial acrominectomy and extensive synovectomy;  Surgeon: GLatanya [name] MD;  Location: [facility];  Service: Orthopedics;  Laterality: Right;  RNFA    Family History  Problem Relation Age of Onset  . Liver cancer Maternal Grandmother   . Lymphoma Maternal Aunt   . Hypertension Mother   . Renal Disease Father   . Hypertension Father   . Heart attack Neg Hx  Social History:  reports that she has been smoking cigarettes.  She has been smoking about 0.25 packs per day. She has never used smokeless tobacco. She reports that she drinks alcohol. She reports that she has current or past drug history. Drug: Marijuana.  Allergies: No Known Allergies  Medications:  I have reviewed the patient's current medications. Prior to Admission:  Medications Prior to Admission  Medication Sig Dispense Refill Last Dose  . acetaminophen (TYLENOL) 500 MG tablet Take 1,000 mg by mouth as needed for mild pain.   Past Week at Unknown time   . amLODipine (NORVASC) 10 MG tablet Take 10 mg by mouth at bedtime.    10/20/2017 at 2200  . aspirin EC 81 MG tablet Take 81 mg by mouth at bedtime.   Past Week at Unknown time  . cloNIDine (CATAPRES) 0.2 MG tablet Take 1 tablet (0.2 mg total) by mouth 2 (two) times daily. 180 tablet 3 10/20/2017 at 1900  . HYDROcodone-acetaminophen (NORCO/VICODIN) 5-325 MG tablet Take 1 tablet by mouth every 6 (six) hours as needed for severe pain. 10 tablet 0 Past Month at Unknown time  . labetalol (NORMODYNE) 300 MG tablet Take 300 mg by mouth 3 (three) times daily.    10/20/2017 at 1900  . lanthanum (FOSRENOL) 1000 MG chewable tablet Chew 2 tablets (2,000 mg total) by mouth 3 (three) times daily with meals. 180 tablet 0 10/20/2017 at 1900  . lisinopril (PRINIVIL,ZESTRIL) 40 MG tablet Take 1 tablet (40 mg total) by mouth 2 (two) times daily. 60 tablet 0 10/20/2017 at 1900  . multivitamin (RENA-VIT) TABS tablet Take 1 tablet by mouth daily.   10/20/2017 at Unknown time  . ondansetron (ZOFRAN) 4 MG tablet Take 1 tablet (4 mg total) by mouth every 6 (six) hours. (Patient taking differently: Take 4 mg by mouth every 6 (six) hours as needed for nausea or vomiting. ) 12 tablet 0 unk at prn  . pantoprazole (PROTONIX) 40 MG tablet Take 1 tablet (40 mg total) by mouth 2 (two) times daily. (Patient taking differently: Take 40 mg by mouth daily. ) 60 tablet 0 10/20/2017 at Unknown time   , Calcitriol 1.75 mcg po tiw. Micera 225 mcg iv q 2 wik , Venofer 10m iv q wk  Results for orders placed or performed during the hospital encounter of 10/21/17 (from the past 48 hour(s))  Culture, blood (routine x 2) Call MD if unable to obtain prior to antibiotics being given     Status: None (Preliminary result)   Collection Time: 10/21/17  9:05 PM  Result Value Ref Range   Specimen Description BLOOD BLOOD LEFT FOREARM    Special Requests      BOTTLES DRAWN AEROBIC AND ANAEROBIC Blood Culture adequate volume   Culture      NO GROWTH < 24  HOURS Performed at [facility] [address], [place] [address]   Report Status PENDING   Culture, blood (routine x 2) Call MD if unable to obtain prior to antibiotics being given     Status: None (Preliminary result)   Collection Time: 10/21/17  9:10 PM  Result Value Ref Range   Specimen Description BLOOD LEFT HAND    Special Requests      BOTTLES DRAWN AEROBIC AND ANAEROBIC Blood Culture adequate volume   Culture      NO GROWTH < 24 HOURS Performed at [facility] [address], [place] [address]   Report Status  PENDING   Procalcitonin - Baseline     Status: None   Collection Time: 10/21/17  9:17 PM  Result Value Ref Range   Procalcitonin 0.49 ng/mL    Comment:        Interpretation: PCT (Procalcitonin) <= 0.5 ng/mL: Systemic infection (sepsis) is not likely. Local bacterial infection is possible. (NOTE)       Sepsis PCT Algorithm           Lower Respiratory Tract                                      Infection PCT Algorithm    ----------------------------     ----------------------------         PCT < 0.25 ng/mL                PCT < 0.10 ng/mL         Strongly encourage             Strongly discourage   discontinuation of antibiotics    initiation of antibiotics    ----------------------------     -----------------------------       PCT 0.25 - 0.50 ng/mL            PCT 0.10 - 0.25 ng/mL               OR       >80% decrease in PCT            Discourage initiation of                                            antibiotics      Encourage discontinuation           of antibiotics    ----------------------------     -----------------------------         PCT >= 0.50 ng/mL              PCT 0.26 - 0.50 ng/mL               AND        <80% decrease in PCT             Encourage initiation of                                             antibiotics       Encourage continuation           of antibiotics    ----------------------------      -----------------------------        PCT >= 0.50 ng/mL                  PCT > 0.50 ng/mL               AND         increase in PCT                  Strongly encourage  initiation of antibiotics    Strongly encourage escalation           of antibiotics                                     -----------------------------                                           PCT <= 0.25 ng/mL                                                 OR                                        > 80% decrease in PCT                                     Discontinue / Do not initiate                                             antibiotics Performed at [facility], [address], [place], [place] [address]   MRSA PCR Screening     Status: None   Collection Time: 10/21/17 10:27 PM  Result Value Ref Range   MRSA by PCR NEGATIVE NEGATIVE    Comment:        The GeneXpert MRSA Assay (FDA approved for NASAL specimens only), is one component of a comprehensive MRSA colonization surveillance program. It is not intended to diagnose MRSA infection nor to guide or monitor treatment for MRSA infections. Performed at [facility], [place] [address], [place], [place] [address]   HIV antibody (Routine Screening)     Status: None   Collection Time: 10/22/17  4:47 AM  Result Value Ref Range   HIV Screen 4th Generation wRfx Non Reactive Non Reactive    Comment: (NOTE) Performed At: [facility] [place], [place] [id number] Rush Farmer MD [id number] Performed at [facility], [place] [address] [place], [place] [address]   Basic metabolic panel     Status: Abnormal   Collection Time: 10/22/17  4:47 AM  Result Value Ref Range   Sodium 137 135 - 145 mmol/L   Potassium 3.2 (L) 3.5 - 5.1 mmol/L    Comment: NO VISIBLE HEMOLYSIS   Chloride 95 (L) 101 - 111 mmol/L   CO2 27 22 - 32 mmol/L   Glucose, Bld 103 (H) 65 - 99 mg/dL   BUN 22 (H) 6 - 20 mg/dL    Creatinine, Ser 5.10 (H) 0.44 - 1.00 mg/dL    Comment: DELTA CHECK NOTED   Calcium 9.0 8.9 - 10.3 mg/dL   GFR calc non Af Amer 9 (L) >60 mL/min   GFR calc Af Amer 10 (L) >60 mL/min    Comment: (NOTE) The eGFR has been calculated using the CKD  EPI equation. This calculation has not been validated in all clinical situations. eGFR's persistently <60 mL/min signify possible Chronic Kidney Disease.    Anion gap 15 5 - 15    Comment: Performed at [facility] [address], [place], [place] [address]  CBC WITH DIFFERENTIAL     Status: Abnormal   Collection Time: 10/22/17  4:47 AM  Result Value Ref Range   WBC 4.9 4.0 - 10.5 K/uL   RBC 2.62 (L) 3.87 - 5.11 MIL/uL   Hemoglobin 8.2 (L) 12.0 - 15.0 g/dL   HCT 26.7 (L) 36.0 - 46.0 %   MCV 101.9 (H) 78.0 - 100.0 fL   MCH 31.3 26.0 - 34.0 pg   MCHC 30.7 30.0 - 36.0 g/dL   RDW 16.0 (H) 11.5 - 15.5 %   Platelets 183 150 - 400 K/uL   Neutrophils Relative % 74 %   Neutro Abs 3.6 1.7 - 7.7 K/uL   Lymphocytes Relative 16 %   Lymphs Abs 0.8 0.7 - 4.0 K/uL   Monocytes Relative 7 %   Monocytes Absolute 0.4 0.1 - 1.0 K/uL   Eosinophils Relative 1 %   Eosinophils Absolute 0.1 0.0 - 0.7 K/uL   Basophils Relative 1 %   Basophils Absolute 0.0 0.0 - 0.1 K/uL   Immature Granulocytes 1 %   Abs Immature Granulocytes 0.0 0.0 - 0.1 K/uL    Comment: Performed at [facility], [address], [place], [place] [address]  Procalcitonin     Status: None   Collection Time: 10/22/17  4:47 AM  Result Value Ref Range   Procalcitonin 0.50 ng/mL    Comment:        Interpretation: PCT (Procalcitonin) <= 0.5 ng/mL: Systemic infection (sepsis) is not likely. Local bacterial infection is possible. (NOTE)       Sepsis PCT Algorithm           Lower Respiratory Tract                                      Infection PCT Algorithm    ----------------------------     ----------------------------         PCT < 0.25 ng/mL                PCT < 0.10 ng/mL          Strongly encourage             Strongly discourage   discontinuation of antibiotics    initiation of antibiotics    ----------------------------     -----------------------------       PCT 0.25 - 0.50 ng/mL            PCT 0.10 - 0.25 ng/mL               OR       >80% decrease in PCT            Discourage initiation of                                            antibiotics      Encourage discontinuation           of antibiotics    ----------------------------     -----------------------------  PCT >= 0.50 ng/mL              PCT 0.26 - 0.50 ng/mL               AND        <80% decrease in PCT             Encourage initiation of                                             antibiotics       Encourage continuation           of antibiotics    ----------------------------     -----------------------------        PCT >= 0.50 ng/mL                  PCT > 0.50 ng/mL               AND         increase in PCT                  Strongly encourage                                      initiation of antibiotics    Strongly encourage escalation           of antibiotics                                     -----------------------------                                           PCT <= 0.25 ng/mL                                                 OR                                        > 80% decrease in PCT                                     Discontinue / Do not initiate                                             antibiotics Performed at [facility], [address] [place], [place] [address]   Strep pneumoniae urinary antigen     Status: None   Collection Time: 10/22/17  6:02 AM  Result Value Ref Range   Strep Pneumo Urinary Antigen NEGATIVE NEGATIVE    Comment:        Infection due to S. pneumoniae cannot be absolutely ruled  out since the antigen present may be below the detection limit of the test.   Renal function panel     Status: Abnormal   Collection Time: 10/23/17   4:41 AM  Result Value Ref Range   Sodium 137 135 - 145 mmol/L   Potassium 3.3 (L) 3.5 - 5.1 mmol/L   Chloride 99 (L) 101 - 111 mmol/L   CO2 26 22 - 32 mmol/L   Glucose, Bld 90 65 - 99 mg/dL   BUN 39 (H) 6 - 20 mg/dL   Creatinine, Ser 7.72 (H) 0.44 - 1.00 mg/dL    Comment: DELTA CHECK NOTED   Calcium 8.5 (L) 8.9 - 10.3 mg/dL   Phosphorus 6.1 (H) 2.5 - 4.6 mg/dL   Albumin 2.9 (L) 3.5 - 5.0 g/dL   GFR calc non Af Amer 5 (L) >60 mL/min   GFR calc Af Amer 6 (L) >60 mL/min    Comment: (NOTE) The eGFR has been calculated using the CKD EPI equation. This calculation has not been validated in all clinical situations. eGFR's persistently <60 mL/min signify possible Chronic Kidney Disease.    Anion gap 12 5 - 15    Comment: Performed at [facility] [address] [place], [place] [address]  CBC     Status: Abnormal   Collection Time: 10/23/17  4:41 AM  Result Value Ref Range   WBC 3.9 (L) 4.0 - 10.5 K/uL   RBC 2.42 (L) 3.87 - 5.11 MIL/uL   Hemoglobin 7.7 (L) 12.0 - 15.0 g/dL   HCT 24.8 (L) 36.0 - 46.0 %   MCV 102.5 (H) 78.0 - 100.0 fL   MCH 31.8 26.0 - 34.0 pg   MCHC 31.0 30.0 - 36.0 g/dL   RDW 15.6 (H) 11.5 - 15.5 %   Platelets 163 150 - 400 K/uL    Comment: Performed at [facility], [place] [address] [place], [place] [address]    No results found.  ROS Blood pressure (!) 145/82, pulse 84, temperature 97.9 F (36.6 C), temperature source Oral, resp. rate 17, height 5' 6" (1.676 m), weight 60.1 kg (132 lb 9.6 oz), last menstrual period 12/05/2010, SpO2 97 %. Physical Exam Physical Examination: General appearance - alert, well appearing, and in no distress Mental status - alert, oriented to person, place, and time Eyes - HTN retinal dz Mouth - mucous membranes moist, pharynx normal without lesions Neck - adenopathy noted PCL Lymphatics - no hepatosplenomegaly, few small anterior cervical nodes, posterior cervical nodes Chest - Rales L base Heart - normal rate,  regular rhythm, normal S1, S2, no murmurs, rubs, clicks or gallops, S1 and S2 normal, S4 present, systolic murmur JJ0/0 at 2nd left intercostal space Abdomen - soft,liver down 5 cm, nontender Extremities - no pedal edema noted, aVF RUA Skin - normal coloration and turgor, no rashes, no suspicious skin lesions noted  Assessment/Plan: 1 Pneu on AB improving 2 ESRD: for HD, mild vol xs 3 Hypertension: use home meds 4. Anemia of ESRD: for tX, cont esa 5. Metabolic Bone Disease: cont vit D 6 Polysubstance abuse P HD, esa, bp meds, AB  James [name] 10/23/2017, 12:00 PM

## 2017-10-23 NOTE — Progress Notes (Signed)
PROGRESS NOTE    [name]  PNT:[id number] DOB: 21-May-1961 DOA: 10/21/2017 PCP: Benito Mccreedy, MD   Brief Narrative:  57 y.o. BF PMHx CVA, ESRD on HD M/W/F, Substance Abuse,  Aortic stenosis,  Chronic diastolic CHF,Heart murmur, Mitral stenosis, HTN, Lupus       Presenting to the emergency department for evaluation of shortness of breath.  Patient denies missing any recent dialysis sessions, denies indiscretions with diet or fluid intake, and denies any chest pain or lower extremity swelling or tenderness.  Reports that she has had a productive cough for the past few days but no fevers or chills.  She woke early this morning with severe dyspnea and reports having 5 episodes of nonbloody diarrhea since that time.  She denies abdominal pain.   ED Course: Upon arrival to the ED, patient is found to be afebrile,  in acute respiratory distress with respiratory rate in the mid 30s, mild tachycardia, and blood pressure in the 190/100 range.  EKG features a sinus tachycardia with rate 111 and QTc interval of 507 ms.  Chest x-ray is notable for dense airspace opacity in the lower lobes bilaterally, worse on the right and concerning for pneumonia.  Chemistry panel is notable for a BUN of 62 with normal potassium and bicarbonate.  CBC features a microcytic anemia with hemoglobin of 9.6, similar to priors.  Troponin is normal.  Patient was treated with enalapril, labetalol, topical nitroglycerin, and acetaminophen in the ED.  Nephrology was consulted and the patient was emergently dialyzed with 1700 cc fluid off.  She reports some improvement with dialysis, but continues to be tachypneic and hypoxic.  She will be admitted for ongoing evaluation and management of acute hypoxic respiratory failure suspected secondary to pulmonary edema and/or HCAP.     Subjective: 6/5 A/O x4, negative S OB, negative CP, negative abdominal pain.  States never misses her HD sessions.  Previous dry weight 59.5 pounds (27.1  kg).  Weigh yourself daily.  States has known about her heart murmur for some time.  States always is hypertensive.   Assessment & Plan:   Principal Problem:   Acute respiratory failure with hypoxia (HCC) Active Problems:   End-stage renal disease on hemodialysis (HCC)   Anemia   Volume overload   HCAP (healthcare-associated pneumonia)  Acute respiratory failure with hypoxia -Secondary to pulmonary edema.  Patient states he has not missed HD session. - Initial HD -1.7 L, currently at HD on room air.  -May be secondary to malignant hypertension    HCAP?   -Initially started on empiric antibiotics however no signs or symptoms of active infection. -Antibiotics DC and patient will be monitored off antibiotics.   Chronic diastolic CHF -Strict in and out -Daily weight -Transfuse for hemoglobin<8 -6/5 transfuse 1 unit PRBC while on HD   Essential HTN with HTN urgency   -Amlodipine 10 mg daily -Hydralazine PRN - Isosorbide mononitrate 10 mg BID -Labetalol 600 mg TID -Lisinopril 40 mg BID -Will await post dialysis today before increasing BP medication further  ESRD on HD M/W/F - dry weight 59.5 pounds (27.1 kg)   Macrocytic anemia  Recent Labs  Lab 10/21/17 1013 10/21/17 1033 10/22/17 0447 10/23/17 0441  HGB 9.6* 9.9* 8.2* 7.7*  -Negative sign of active bleeding -Occult blood pending - See CHF   DVT prophylaxis: Subcu heparin Code Status: Full Family Communication: None Disposition Plan: Discharge next 24 to 48 hours   Consultants:  Nephrology    Procedures/Significant Events:  I have personally reviewed and interpreted all radiology studies and my findings are as above.  VENTILATOR SETTINGS:    Cultures   Antimicrobials: Anti-infectives (From admission, onward)   Start     Stop   10/23/17 1200  vancomycin (VANCOCIN) 500 mg in sodium chloride 0.9 % 100 mL IVPB  Status:  Discontinued     10/22/17 1202   10/21/17 2212  ceFEPIme (MAXIPIME) 1 g  in sodium chloride 0.9 % 100 mL IVPB  Status:  Discontinued     10/22/17 1839   10/21/17 2200  ceFEPIme (MAXIPIME) 1 g in sodium chloride 0.9 % 100 mL IVPB  Status:  Discontinued     10/21/17 2213   10/21/17 2130  vancomycin (VANCOCIN) 1,250 mg in sodium chloride 0.9 % 250 mL IVPB     10/22/17 0100       Devices   LINES / TUBES:      Continuous Infusions:   Objective: Vitals:   10/22/17 2213 10/22/17 2333 10/23/17 0509 10/23/17 0802  BP: (!) 165/90 132/71 (!) 149/72 (!) 145/82  Pulse: 92 82 99 84  Resp:  19 20 17   Temp:  98.3 F (36.8 C) 98.3 F (36.8 C) 97.9 F (36.6 C)  TempSrc:  Oral Oral Oral  SpO2:  98% 99% 97%  Weight:   132 lb 9.6 oz (60.1 kg)   Height:        Intake/Output Summary (Last 24 hours) at 10/23/2017 0901 Last data filed at 10/23/2017 0630 Gross per 24 hour  Intake 345 ml  Output 100 ml  Net 245 ml   Filed Weights   10/21/17 2237 10/22/17 0351 10/23/17 0509  Weight: 130 lb 9.6 oz (59.2 kg) 128 lb 14.4 oz (58.5 kg) 132 lb 9.6 oz (60.1 kg)    Examination:  General: A/O x4, No acute respiratory distress Neck:  Negative scars, masses, torticollis, lymphadenopathy, JVD Lungs: Clear to auscultation bilaterally without wheezes or crackles Cardiovascular: Regular rate and rhythm, positive grade 4/6 systolic murmur, negative rub normal S1 and S2 Abdomen: negative abdominal pain, nondistended, positive soft, bowel sounds, no rebound, no ascites, no appreciable mass Extremities: No significant cyanosis, clubbing, or edema bilateral lower extremities Skin: Negative rashes, lesions, ulcers Psychiatric:  Negative depression, negative anxiety, negative fatigue, negative mania  Central nervous system:  Cranial nerves II through XII intact, tongue/uvula midline, all extremities muscle strength 5/5, sensation intact throughout, finger nose finger bilateral within normal limits, negative dysarthria, negative expressive aphasia, negative receptive aphasia.  .      Data Reviewed: Care during the described time interval was provided by me .  I have reviewed this patient's available data, including medical history, events of note, physical examination, and all test results as part of my evaluation.   CBC: Recent Labs  Lab 10/21/17 1013 10/21/17 1033 10/22/17 0447 10/23/17 0441  WBC 7.8  --  4.9 3.9*  NEUTROABS  --   --  3.6  --   HGB 9.6* 9.9* 8.2* 7.7*  HCT 31.3* 29.0* 26.7* 24.8*  MCV 103.6*  --  101.9* 102.5*  PLT 249  --  183 295   Basic Metabolic Panel: Recent Labs  Lab 10/21/17 1033 10/21/17 1141 10/22/17 0447 10/23/17 0441  NA 138 141 137 137  K 5.8* 3.9 3.2* 3.3*  CL 103 99* 95* 99*  CO2  --  27 27 26   GLUCOSE 130* 115* 103* 90  BUN 75* 62* 22* 39*  CREATININE 9.90* 10.33* 5.10* 7.72*  CALCIUM  --  9.1 9.0 8.5*  PHOS  --   --   --  6.1*   GFR: Estimated Creatinine Clearance: 7.7 mL/min (A) (by C-G formula based on SCr of 7.72 mg/dL (H)). Liver Function Tests: Recent Labs  Lab 10/23/17 0441  ALBUMIN 2.9*   No results for input(s): LIPASE, AMYLASE in the last 168 hours. No results for input(s): AMMONIA in the last 168 hours. Coagulation Profile: No results for input(s): INR, PROTIME in the last 168 hours. Cardiac Enzymes: No results for input(s): CKTOTAL, CKMB, CKMBINDEX, TROPONINI in the last 168 hours. BNP (last 3 results) No results for input(s): PROBNP in the last 8760 hours. HbA1C: No results for input(s): HGBA1C in the last 72 hours. CBG: No results for input(s): GLUCAP in the last 168 hours. Lipid Profile: No results for input(s): CHOL, HDL, LDLCALC, TRIG, CHOLHDL, LDLDIRECT in the last 72 hours. Thyroid Function Tests: No results for input(s): TSH, T4TOTAL, FREET4, T3FREE, THYROIDAB in the last 72 hours. Anemia Panel: No results for input(s): VITAMINB12, FOLATE, FERRITIN, TIBC, IRON, RETICCTPCT in the last 72 hours. Urine analysis:    Component Value Date/Time   COLORURINE YELLOW 05/01/2015 2005    APPEARANCEUR CLEAR 05/01/2015 2005   LABSPEC 1.012 05/01/2015 2005   PHURINE 8.5 (H) 05/01/2015 2005   GLUCOSEU 100 (A) 05/01/2015 2005   HGBUR SMALL (A) 05/01/2015 2005   BILIRUBINUR NEGATIVE 05/01/2015 2005   KETONESUR NEGATIVE 05/01/2015 2005   PROTEINUR 100 (A) 05/01/2015 2005   UROBILINOGEN 0.2 07/27/2014 0150   NITRITE NEGATIVE 05/01/2015 2005   LEUKOCYTESUR NEGATIVE 05/01/2015 2005   Sepsis Labs: @LABRCNTIP (procalcitonin:4,lacticidven:4)  ) Recent Results (from the past 240 hour(s))  Culture, blood (routine x 2) Call MD if unable to obtain prior to antibiotics being given     Status: None (Preliminary result)   Collection Time: 10/21/17  9:05 PM  Result Value Ref Range Status   Specimen Description BLOOD BLOOD LEFT FOREARM  Final   Special Requests   Final    BOTTLES DRAWN AEROBIC AND ANAEROBIC Blood Culture adequate volume   Culture   Final    NO GROWTH < 24 HOURS Performed at [facility], [place] [address] [place], [place] [address]    Report Status PENDING  Incomplete  Culture, blood (routine x 2) Call MD if unable to obtain prior to antibiotics being given     Status: None (Preliminary result)   Collection Time: 10/21/17  9:10 PM  Result Value Ref Range Status   Specimen Description BLOOD LEFT HAND  Final   Special Requests   Final    BOTTLES DRAWN AEROBIC AND ANAEROBIC Blood Culture adequate volume   Culture   Final    NO GROWTH < 24 HOURS Performed at [facility], [place] [address] [place], [place] [address]    Report Status PENDING  Incomplete  MRSA PCR Screening     Status: None   Collection Time: 10/21/17 10:27 PM  Result Value Ref Range Status   MRSA by PCR NEGATIVE NEGATIVE Final    Comment:        The GeneXpert MRSA Assay (FDA approved for NASAL specimens only), is one component of a comprehensive MRSA colonization surveillance program. It is not intended to diagnose MRSA infection nor to guide or monitor treatment for MRSA  infections. Performed at [facility], [place] [address], [place], [place] [address]          Radiology Studies: Dg Chest Portable 1 View  Result Date:  10/21/2017 CLINICAL DATA:  Shortness of breath and diarrhea beginning at 2 a.m. this morning. EXAM: PORTABLE CHEST 1 VIEW COMPARISON:  PA and lateral chest 01/26/2017. FINDINGS: Bilateral lower lung zone airspace disease consistent with pneumonia is worse on the right. There is cardiomegaly. No pneumothorax. No pleural effusion. No acute bony abnormality. IMPRESSION: Dense airspace disease in the lower lung zones bilaterally is worse on the right and most consistent with pneumonia. Electronically Signed   By: Inge Rise M.D.   On: 10/21/2017 10:28        Scheduled Meds: . amLODipine  10 mg Oral QHS  . aspirin EC  81 mg Oral QHS  . Chlorhexidine Gluconate Cloth  6 each Topical Q0600  . cloNIDine  0.2 mg Oral TID  . heparin  5,000 Units Subcutaneous Q8H  . isosorbide mononitrate  10 mg Oral BID  . labetalol  300 mg Oral TID  . lanthanum  2,000 mg Oral TID WC  . lisinopril  40 mg Oral BID  . multivitamin  1 tablet Oral Daily  . pantoprazole  40 mg Oral Daily  . sodium chloride flush  3 mL Intravenous Q12H   Continuous Infusions:   LOS: 2 days    Time spent: 40 minutes    Hardin Hardenbrook, Geraldo Docker, MD [facility] Pager [phone]   If 7PM-7AM, please contact night-coverage [email or web address] Password [email or web address] 10/23/2017, 9:01 AM

## 2017-10-23 NOTE — Plan of Care (Signed)
  Problem: Pain Managment: Goal: General experience of comfort will improve Outcome: Progressing   

## 2017-10-23 NOTE — Procedures (Signed)
I was present at this session.  I have reviewed the session itself and made appropriate changes.  HD via RUA AVF, some aneurysms. Access press ok. bp ^ to start, lower vol.  [name] Harmoney Sienkiewicz 6/5/20193:43 PM

## 2017-10-24 LAB — TYPE AND SCREEN
ABO/RH(D): A NEG
Antibody Screen: NEGATIVE
Unit division: 0

## 2017-10-24 LAB — CBC
HCT: 28.3 % — ABNORMAL LOW (ref 36.0–46.0)
HEMOGLOBIN: 8.9 g/dL — AB (ref 12.0–15.0)
MCH: 30.9 pg (ref 26.0–34.0)
MCHC: 31.4 g/dL (ref 30.0–36.0)
MCV: 98.3 fL (ref 78.0–100.0)
PLATELETS: 156 10*3/uL (ref 150–400)
RBC: 2.88 MIL/uL — AB (ref 3.87–5.11)
RDW: 16.4 % — ABNORMAL HIGH (ref 11.5–15.5)
WBC: 5.2 10*3/uL (ref 4.0–10.5)

## 2017-10-24 LAB — MAGNESIUM: MAGNESIUM: 1.9 mg/dL (ref 1.7–2.4)

## 2017-10-24 LAB — RENAL FUNCTION PANEL
ALBUMIN: 3 g/dL — AB (ref 3.5–5.0)
ANION GAP: 11 (ref 5–15)
BUN: 18 mg/dL (ref 6–20)
CALCIUM: 9.2 mg/dL (ref 8.9–10.3)
CO2: 29 mmol/L (ref 22–32)
Chloride: 96 mmol/L — ABNORMAL LOW (ref 101–111)
Creatinine, Ser: 4.88 mg/dL — ABNORMAL HIGH (ref 0.44–1.00)
GFR, EST AFRICAN AMERICAN: 11 mL/min — AB (ref >60)
GFR, EST NON AFRICAN AMERICAN: 9 mL/min — AB (ref >60)
Glucose, Bld: 106 mg/dL — ABNORMAL HIGH (ref 65–99)
PHOSPHORUS: 4.7 mg/dL — AB (ref 2.5–4.6)
Potassium: 3.8 mmol/L (ref 3.5–5.1)
SODIUM: 136 mmol/L (ref 135–145)

## 2017-10-24 LAB — BPAM RBC
Blood Product Expiration Date: 201906192359
ISSUE DATE / TIME: 201906051828
Unit Type and Rh: 600

## 2017-10-24 MED ORDER — DARBEPOETIN ALFA 200 MCG/0.4ML IJ SOSY
200.0000 ug | PREFILLED_SYRINGE | INTRAMUSCULAR | Status: DC
  Administered 2017-10-25: 200 ug via INTRAVENOUS
  Filled 2017-10-24: qty 0.4

## 2017-10-24 MED ORDER — CHLORHEXIDINE GLUCONATE CLOTH 2 % EX PADS
6.0000 | MEDICATED_PAD | Freq: Every day | CUTANEOUS | Status: DC
  Administered 2017-10-24 – 2017-10-25 (×2): 6 via TOPICAL

## 2017-10-24 MED ORDER — ISOSORBIDE MONONITRATE 20 MG PO TABS
20.0000 mg | ORAL_TABLET | Freq: Two times a day (BID) | ORAL | Status: DC
  Administered 2017-10-24 – 2017-10-25 (×2): 20 mg via ORAL
  Filled 2017-10-24 (×2): qty 1

## 2017-10-24 NOTE — Progress Notes (Signed)
PROGRESS NOTE    [name]  WGN:[id number] DOB: 1960-09-22 DOA: 10/21/2017 PCP: Benito Mccreedy, MD   Brief Narrative:  57 y.o. BF PMHx CVA, ESRD on HD M/W/F, Substance Abuse,  Aortic stenosis,  Chronic diastolic CHF,Heart murmur, Mitral stenosis, HTN, Lupus       Presenting to the emergency department for evaluation of shortness of breath.  Patient denies missing any recent dialysis sessions, denies indiscretions with diet or fluid intake, and denies any chest pain or lower extremity swelling or tenderness.  Reports that she has had a productive cough for the past few days but no fevers or chills.  She woke early this morning with severe dyspnea and reports having 5 episodes of nonbloody diarrhea since that time.  She denies abdominal pain.   ED Course: Upon arrival to the ED, patient is found to be afebrile,  in acute respiratory distress with respiratory rate in the mid 30s, mild tachycardia, and blood pressure in the 190/100 range.  EKG features a sinus tachycardia with rate 111 and QTc interval of 507 ms.  Chest x-ray is notable for dense airspace opacity in the lower lobes bilaterally, worse on the right and concerning for pneumonia.  Chemistry panel is notable for a BUN of 62 with normal potassium and bicarbonate.  CBC features a microcytic anemia with hemoglobin of 9.6, similar to priors.  Troponin is normal.  Patient was treated with enalapril, labetalol, topical nitroglycerin, and acetaminophen in the ED.  Nephrology was consulted and the patient was emergently dialyzed with 1700 cc fluid off.  She reports some improvement with dialysis, but continues to be tachypneic and hypoxic.  She will be admitted for ongoing evaluation and management of acute hypoxic respiratory failure suspected secondary to pulmonary edema and/or HCAP.     Subjective: 6/6 A/O x4, negative S OB, negative CP, negative abdominal pain.  BP elevated currently secondary to patient having episode of vomiting this  A.m. unable to take medication.   Assessment & Plan:   Principal Problem:   Acute respiratory failure with hypoxia (HCC) Active Problems:   End-stage renal disease on hemodialysis (HCC)   Anemia   Volume overload   HCAP (healthcare-associated pneumonia)  Acute respiratory failure with hypoxia -Multifactorial malignant hypertension, pulmonary edema.  Patient states she has not missed HD session. - Initial HD -1.7 L, currently at HD on room air.    HCAP?   -Initially started on empiric antibiotics however no signs or symptoms of active infection. -Antibiotics DC and patient will be monitored off antibiotics.   Chronic diastolic CHF -Strict in and out since admission -3.7 L -Daily weight Filed Weights   10/23/17 1512 10/23/17 1926 10/24/17 0403  Weight: 134 lb 11.2 oz (61.1 kg) 132 lb 4.4 oz (60 kg) 132 lb 8 oz (60.1 kg)  -Transfuse for hemoglobin<8 -6/5 transfuse 1 unit PRBC while on HD   Essential HTN with HTN urgency   -Amlodipine 10 mg daily -Hydralazine PRN - 6/6 increase isosorbide mononitrate 20 mg BID -Labetalol 600 mg TID -Lisinopril 40 mg BID  ESRD on HD M/W/F - dry weight 59.5 pounds (27.1 kg) -Per nephrology note patient to receive HD on Friday, will receive new dry weight.   Macrocytic anemia  Recent Labs  Lab 10/21/17 1013 10/21/17 1033 10/22/17 0447 10/23/17 0441  HGB 9.6* 9.9* 8.2* 7.7*  -Negative sign of active bleeding -Occult blood pending - See CHF     DVT prophylaxis: Subcu heparin Code Status: Full Family Communication: None Disposition Plan: Discharge  next 24 to 48 hours   Consultants:  Nephrology    Procedures/Significant Events:     I have personally reviewed and interpreted all radiology studies and my findings are as above.  VENTILATOR SETTINGS:    Cultures   Antimicrobials: Anti-infectives (From admission, onward)   Start     Stop   10/23/17 1200  vancomycin (VANCOCIN) 500 mg in sodium chloride 0.9 % 100 mL  IVPB  Status:  Discontinued     10/22/17 1202   10/21/17 2212  ceFEPIme (MAXIPIME) 1 g in sodium chloride 0.9 % 100 mL IVPB  Status:  Discontinued     10/22/17 1839   10/21/17 2200  ceFEPIme (MAXIPIME) 1 g in sodium chloride 0.9 % 100 mL IVPB  Status:  Discontinued     10/21/17 2213   10/21/17 2130  vancomycin (VANCOCIN) 1,250 mg in sodium chloride 0.9 % 250 mL IVPB     10/22/17 0100       Devices   LINES / TUBES:      Continuous Infusions: . sodium chloride       Objective: Vitals:   10/24/17 0035 10/24/17 0051 10/24/17 0403 10/24/17 0717  BP: (!) 159/97   139/72  Pulse: 86   87  Resp: (!) 23   17  Temp:  99.3 F (37.4 C) 98.7 F (37.1 C) 98 F (36.7 C)  TempSrc:  Oral Oral Oral  SpO2: 95%   95%  Weight:   132 lb 8 oz (60.1 kg)   Height:        Intake/Output Summary (Last 24 hours) at 10/24/2017 6568 Last data filed at 10/24/2017 1275 Gross per 24 hour  Intake 905 ml  Output 1722 ml  Net -817 ml   Filed Weights   10/23/17 1512 10/23/17 1926 10/24/17 0403  Weight: 134 lb 11.2 oz (61.1 kg) 132 lb 4.4 oz (60 kg) 132 lb 8 oz (60.1 kg)    Physical Exam:  General: A/O x4, No acute respiratory distress Neck:  Negative scars, masses, torticollis, lymphadenopathy, JVD Lungs: Clear to auscultation bilaterally without wheezes or crackles Cardiovascular: Regular rate and rhythm, positive grade 4/6 systolic murmur, negative rub normal S1 and S2 Abdomen: negative abdominal pain, nondistended, positive soft, bowel sounds, no rebound, no ascites, no appreciable mass Extremities: No significant cyanosis, clubbing, or edema bilateral lower extremities Skin: Negative rashes, lesions, ulcers Psychiatric:  Negative depression, negative anxiety, negative fatigue, negative mania  Central nervous system:  Cranial nerves II through XII intact, tongue/uvula midline, all extremities muscle strength 5/5, sensation intact throughout, finger nose finger bilateral within normal limits,  negative dysarthria, negative expressive aphasia, negative receptive aphasia.  .     Data Reviewed: Care during the described time interval was provided by me .  I have reviewed this patient's available data, including medical history, events of note, physical examination, and all test results as part of my evaluation.   CBC: Recent Labs  Lab 10/21/17 1013 10/21/17 1033 10/22/17 0447 10/23/17 0441  WBC 7.8  --  4.9 3.9*  NEUTROABS  --   --  3.6  --   HGB 9.6* 9.9* 8.2* 7.7*  HCT 31.3* 29.0* 26.7* 24.8*  MCV 103.6*  --  101.9* 102.5*  PLT 249  --  183 170   Basic Metabolic Panel: Recent Labs  Lab 10/21/17 1033 10/21/17 1141 10/22/17 0447 10/23/17 0441  NA 138 141 137 137  K 5.8* 3.9 3.2* 3.3*  CL 103 99* 95* 99*  CO2  --  27 27 26   GLUCOSE 130* 115* 103* 90  BUN 75* 62* 22* 39*  CREATININE 9.90* 10.33* 5.10* 7.72*  CALCIUM  --  9.1 9.0 8.5*  PHOS  --   --   --  6.1*   GFR: Estimated Creatinine Clearance: 7.7 mL/min (A) (by C-G formula based on SCr of 7.72 mg/dL (H)). Liver Function Tests: Recent Labs  Lab 10/23/17 0441  ALBUMIN 2.9*   No results for input(s): LIPASE, AMYLASE in the last 168 hours. No results for input(s): AMMONIA in the last 168 hours. Coagulation Profile: No results for input(s): INR, PROTIME in the last 168 hours. Cardiac Enzymes: No results for input(s): CKTOTAL, CKMB, CKMBINDEX, TROPONINI in the last 168 hours. BNP (last 3 results) No results for input(s): PROBNP in the last 8760 hours. HbA1C: No results for input(s): HGBA1C in the last 72 hours. CBG: No results for input(s): GLUCAP in the last 168 hours. Lipid Profile: No results for input(s): CHOL, HDL, LDLCALC, TRIG, CHOLHDL, LDLDIRECT in the last 72 hours. Thyroid Function Tests: No results for input(s): TSH, T4TOTAL, FREET4, T3FREE, THYROIDAB in the last 72 hours. Anemia Panel: No results for input(s): VITAMINB12, FOLATE, FERRITIN, TIBC, IRON, RETICCTPCT in the last 72  hours. Urine analysis:    Component Value Date/Time   COLORURINE YELLOW 05/01/2015 2005   APPEARANCEUR CLEAR 05/01/2015 2005   LABSPEC 1.012 05/01/2015 2005   PHURINE 8.5 (H) 05/01/2015 2005   GLUCOSEU 100 (A) 05/01/2015 2005   HGBUR SMALL (A) 05/01/2015 2005   BILIRUBINUR NEGATIVE 05/01/2015 2005   KETONESUR NEGATIVE 05/01/2015 2005   PROTEINUR 100 (A) 05/01/2015 2005   UROBILINOGEN 0.2 07/27/2014 0150   NITRITE NEGATIVE 05/01/2015 2005   LEUKOCYTESUR NEGATIVE 05/01/2015 2005   Sepsis Labs: @LABRCNTIP (procalcitonin:4,lacticidven:4)  ) Recent Results (from the past 240 hour(s))  Culture, blood (routine x 2) Call MD if unable to obtain prior to antibiotics being given     Status: None (Preliminary result)   Collection Time: 10/21/17  9:05 PM  Result Value Ref Range Status   Specimen Description BLOOD BLOOD LEFT FOREARM  Final   Special Requests   Final    BOTTLES DRAWN AEROBIC AND ANAEROBIC Blood Culture adequate volume   Culture   Final    NO GROWTH 2 DAYS Performed at [facility], [place] [address] [place], [place] [address]    Report Status PENDING  Incomplete  Culture, blood (routine x 2) Call MD if unable to obtain prior to antibiotics being given     Status: None (Preliminary result)   Collection Time: 10/21/17  9:10 PM  Result Value Ref Range Status   Specimen Description BLOOD LEFT HAND  Final   Special Requests   Final    BOTTLES DRAWN AEROBIC AND ANAEROBIC Blood Culture adequate volume   Culture   Final    NO GROWTH 2 DAYS Performed at [facility], [place] [address] [place], [place] [address]    Report Status PENDING  Incomplete  MRSA PCR Screening     Status: None   Collection Time: 10/21/17 10:27 PM  Result Value Ref Range Status   MRSA by PCR NEGATIVE NEGATIVE Final    Comment:        The GeneXpert MRSA Assay (FDA approved for NASAL specimens only), is one component of a comprehensive MRSA colonization surveillance program. It is  not intended to diagnose MRSA infection nor to guide or monitor treatment for MRSA infections. Performed at [facility], [place]  [address], [place],  [address]          Radiology Studies: No results found.      Scheduled Meds: . amLODipine  10 mg Oral QHS  . aspirin EC  81 mg Oral QHS  . calcitRIOL  1.75 mcg Oral QODAY  . Chlorhexidine Gluconate Cloth  6 each Topical Q0600  . [START ON 10/30/2017] darbepoetin (ARANESP) injection - DIALYSIS  200 mcg Intravenous Q Wed-HD  . heparin  5,000 Units Subcutaneous Q8H  . isosorbide mononitrate  10 mg Oral BID  . labetalol  600 mg Oral TID  . lanthanum  2,000 mg Oral TID WC  . lisinopril  40 mg Oral BID  . multivitamin  1 tablet Oral Daily  . pantoprazole  40 mg Oral Daily  . sodium chloride flush  3 mL Intravenous Q12H   Continuous Infusions: . sodium chloride       LOS: 3 days    Time spent: 40 minutes    WOODS, Geraldo Docker, MD [facility] Pager [phone]   If 7PM-7AM, please contact night-coverage [email or web address] Password [email or web address] 10/24/2017, 9:21 AM

## 2017-10-24 NOTE — Progress Notes (Addendum)
[name] Progress Note   Dialysis Orders: [name] on MWF since 2007. Primary Nephrologist [name]. [name] 59.5 kg. 3.5 hr HD Bath 2K/2 Ca, Dialyzer 180NR, Heparin 5000 Units pre . Access  RUA AVF. Calcitriol 1.75  Mircera 225 q 2 weeks - last 200 5/29 venofer 100 q 2 wks - last 5/29  Assessment/Plan: 1. Acute resp failure with hypoxia - intially thought to have PNA but abtx stopped after one dose/probable bronchitis in pt with smoking history - net UF 1.7 Monday and again Wednesday. Tmax 99.3 -  2. ESRD -MWF - next HD Friday first round - to standing edw wt 3. Anemia - hgb 7.7 , Aranesp 200 given 6/5 - on venofer 100 q 2 weeks - last dose 5/29 4. Secondary hyperparathyroidism - continue caclcitriol/fosrenol 5. HTN/volume - titrate volume/continue usual meds - net UF 1.7 6/5 post wt 60 no BP drop on HD- try to inch down further tomorrow. 6. Polysubstance abuse ?contrib to resp status  Connie Jacobson, PA-C [place] [phone] 10/24/2017,8:27 AM  LOS: 3 days  I have seen and examined this patient and agree with the plan of care seen, eval, examined , counseled patient, discussed with PA .  [name] [name] 10/24/2017, 9:50 AM   Subjective:   Smokes 1- 2 cigarettes per day. Still coughing - mostly dry - coughs with deep inspiration. O2 off about 10 min while she is eating and sats 96% . Cramped during HD yesterday.  Objective Vitals:   10/24/17 0035 10/24/17 0051 10/24/17 0403 10/24/17 0717  BP: (!) 159/97   139/72  Pulse: 86   87  Resp: (!) 23   17  Temp:  99.3 F (37.4 C) 98.7 F (37.1 C) 98 F (36.7 C)  TempSrc:  Oral Oral Oral  SpO2: 95%   95%  Weight:   60.1 kg (132 lb 8 oz)   Height:       Physical Exam General: NAD Heart: RRR Gr2/6 M Lungs: crackles right base - coughs w/deep inspiration - good expansion Abdomen: soft NT Extremities: no edema Dialysis Access:  Right upper AVF + bruit- prox aneurysmal area (for revision in  future)   Additional Objective Labs: Basic Metabolic Panel: Recent Labs  Lab 10/21/17 1141 10/22/17 0447 10/23/17 0441  NA 141 137 137  K 3.9 3.2* 3.3*  CL 99* 95* 99*  CO2 27 27 26   GLUCOSE 115* 103* 90  BUN 62* 22* 39*  CREATININE 10.33* 5.10* 7.72*  CALCIUM 9.1 9.0 8.5*  PHOS  --   --  6.1*   Liver Function Tests: Recent Labs  Lab 10/23/17 0441  ALBUMIN 2.9*   No results for input(s): LIPASE, AMYLASE in the last 168 hours. CBC: Recent Labs  Lab 10/21/17 1013 10/21/17 1033 10/22/17 0447 10/23/17 0441  WBC 7.8  --  4.9 3.9*  NEUTROABS  --   --  3.6  --   HGB 9.6* 9.9* 8.2* 7.7*  HCT 31.3* 29.0* 26.7* 24.8*  MCV 103.6*  --  101.9* 102.5*  PLT 249  --  183 163   Blood Culture    Component Value Date/Time   SDES BLOOD LEFT HAND 10/21/2017 2110   SPECREQUEST  10/21/2017 2110    BOTTLES DRAWN AEROBIC AND ANAEROBIC Blood Culture adequate volume   CULT  10/21/2017 2110    NO GROWTH 2 DAYS Performed at [facility] [address] [place],  [address]    REPTSTATUS PENDING 10/21/2017 2110  Iron Studies: No results for input(s): IRON, TIBC, TRANSFERRIN, FERRITIN in the last 72 hours. Lab Results  Component Value Date   INR 1.00 10/09/2016   INR 1.05 07/25/2014   Studies/Results: No results found. Medications: . sodium chloride     . amLODipine  10 mg Oral QHS  . aspirin EC  81 mg Oral QHS  . calcitRIOL  1.75 mcg Oral QODAY  . Chlorhexidine Gluconate Cloth  6 each Topical Q0600  . [START ON 10/30/2017] darbepoetin (ARANESP) injection - DIALYSIS  200 mcg Intravenous Q Wed-HD  . heparin  5,000 Units Subcutaneous Q8H  . isosorbide mononitrate  10 mg Oral BID  . labetalol  600 mg Oral TID  . lanthanum  2,000 mg Oral TID WC  . lisinopril  40 mg Oral BID  . multivitamin  1 tablet Oral Daily  . pantoprazole  40 mg Oral Daily  . sodium chloride flush  3 mL Intravenous Q12H

## 2017-10-24 NOTE — Plan of Care (Signed)
Wean O2 to Room Air.

## 2017-10-24 NOTE — Care Management Important Message (Signed)
Important Message  Patient Details  Name: [name] MRN: [id number] Date of Birth: 18-May-1961   Medicare Important Message Given:  Yes    [name] 10/24/2017, 3:17 PM

## 2017-10-25 DIAGNOSIS — J9601 Acute respiratory failure with hypoxia: Secondary | ICD-10-CM

## 2017-10-25 DIAGNOSIS — Z992 Dependence on renal dialysis: Secondary | ICD-10-CM

## 2017-10-25 DIAGNOSIS — N186 End stage renal disease: Secondary | ICD-10-CM

## 2017-10-25 DIAGNOSIS — E8779 Other fluid overload: Secondary | ICD-10-CM

## 2017-10-25 LAB — CBC
HCT: 28.3 % — ABNORMAL LOW (ref 36.0–46.0)
Hemoglobin: 9 g/dL — ABNORMAL LOW (ref 12.0–15.0)
MCH: 30.6 pg (ref 26.0–34.0)
MCHC: 31.8 g/dL (ref 30.0–36.0)
MCV: 96.3 fL (ref 78.0–100.0)
Platelets: 161 10*3/uL (ref 150–400)
RBC: 2.94 MIL/uL — ABNORMAL LOW (ref 3.87–5.11)
RDW: 15.8 % — ABNORMAL HIGH (ref 11.5–15.5)
WBC: 5.7 10*3/uL (ref 4.0–10.5)

## 2017-10-25 LAB — RENAL FUNCTION PANEL
Albumin: 3.1 g/dL — ABNORMAL LOW (ref 3.5–5.0)
Anion gap: 15 (ref 5–15)
BUN: 33 mg/dL — ABNORMAL HIGH (ref 6–20)
CO2: 23 mmol/L (ref 22–32)
Calcium: 9.5 mg/dL (ref 8.9–10.3)
Chloride: 95 mmol/L — ABNORMAL LOW (ref 101–111)
Creatinine, Ser: 6.93 mg/dL — ABNORMAL HIGH (ref 0.44–1.00)
GFR calc Af Amer: 7 mL/min — ABNORMAL LOW (ref >60)
GFR calc non Af Amer: 6 mL/min — ABNORMAL LOW (ref >60)
Glucose, Bld: 94 mg/dL (ref 65–99)
Phosphorus: 4.8 mg/dL — ABNORMAL HIGH (ref 2.5–4.6)
Potassium: 4.4 mmol/L (ref 3.5–5.1)
Sodium: 133 mmol/L — ABNORMAL LOW (ref 135–145)

## 2017-10-25 MED ORDER — ALTEPLASE 2 MG IJ SOLR: 2.0000 mg | Freq: Once | INTRAMUSCULAR | Status: DC | PRN

## 2017-10-25 MED ORDER — CALCITRIOL 0.5 MCG PO CAPS
ORAL_CAPSULE | ORAL | Status: AC
  Filled 2017-10-25: qty 3

## 2017-10-25 MED ORDER — LIDOCAINE-PRILOCAINE 2.5-2.5 % EX CREA
1.0000 "application " | TOPICAL_CREAM | CUTANEOUS | Status: DC | PRN
  Filled 2017-10-25: qty 5

## 2017-10-25 MED ORDER — LIDOCAINE HCL (PF) 1 % IJ SOLN: 5.0000 mL | INTRAMUSCULAR | Status: DC | PRN

## 2017-10-25 MED ORDER — CALCITRIOL 0.25 MCG PO CAPS: 1.7500 ug | ORAL_CAPSULE | ORAL | 0 refills | Status: DC

## 2017-10-25 MED ORDER — HEPARIN SODIUM (PORCINE) 1000 UNIT/ML DIALYSIS
20.0000 [IU]/kg | INTRAMUSCULAR | Status: DC | PRN
  Filled 2017-10-25: qty 2

## 2017-10-25 MED ORDER — ACETAMINOPHEN 325 MG PO TABS
ORAL_TABLET | ORAL | Status: AC
  Administered 2017-10-25: 650 mg via ORAL
  Filled 2017-10-25: qty 2

## 2017-10-25 MED ORDER — DARBEPOETIN ALFA 200 MCG/0.4ML IJ SOSY
PREFILLED_SYRINGE | INTRAMUSCULAR | Status: AC
  Administered 2017-10-25: 200 ug via INTRAVENOUS
  Filled 2017-10-25: qty 0.4

## 2017-10-25 MED ORDER — SODIUM CHLORIDE 0.9 % IV SOLN: 100.0000 mL | INTRAVENOUS | Status: DC | PRN

## 2017-10-25 MED ORDER — LABETALOL HCL 300 MG PO TABS: 600.0000 mg | ORAL_TABLET | Freq: Three times a day (TID) | ORAL | 0 refills | Status: DC

## 2017-10-25 MED ORDER — CALCITRIOL 0.25 MCG PO CAPS
ORAL_CAPSULE | ORAL | Status: AC
  Filled 2017-10-25: qty 1

## 2017-10-25 MED ORDER — HEPARIN SODIUM (PORCINE) 1000 UNIT/ML DIALYSIS
1000.0000 [IU] | INTRAMUSCULAR | Status: DC | PRN
  Filled 2017-10-25: qty 1

## 2017-10-25 MED ORDER — PENTAFLUOROPROP-TETRAFLUOROETH EX AERO: 1.0000 "application " | INHALATION_SPRAY | CUTANEOUS | Status: DC | PRN

## 2017-10-25 NOTE — Care Management Note (Signed)
Case Management Note  Patient Details  Name: [name] MRN: [id number] Date of Birth: 1960/12/14  Subjective/Objective:                    Action/Plan: Pt is discharging home with self care.  PCP: Dr Vista Lawman Insurance: [employer] Pt has transportation home.    Expected Discharge Date:  10/25/17               Expected Discharge Plan:  Home/Self Care  In-House Referral:     Discharge planning Services     Post Acute Care Choice:    Choice offered to:     DME Arranged:    DME Agency:     HH Arranged:    HH Agency:     Status of Service:  Completed, signed off  If discussed at [employer] of Stay Meetings, dates discussed:    Additional Comments:  Pollie Friar, RN 10/25/2017, 3:30 PM

## 2017-10-25 NOTE — Progress Notes (Signed)
Discharge instructions reviewed with pt. Pt has no questions at this time. Pt states she is ready for d/c. Prescription given to pt.

## 2017-10-25 NOTE — Progress Notes (Signed)
[facility] Progress Note   Dialysis Orders: [facility] on MWF since 2007. Primary Nephrologist [name]. [name] 59.5 kg. 3.5 hr HD Bath 2K/2 Ca, Dialyzer 180NR, Heparin 5000 Units pre . Access  RUA AVF. Calcitriol 1.75  Mircera 225 q 2 weeks - last 200 5/29 venofer 100 q 2 wks - last 5/29  Assessment/Plan: 1. Acute resp failure with hypoxia - intially thought to have PNA but abtx stopped after one dose/probable bronchitis.  2. ESRD -MWF - HD today 3. Anemia - hgb 7.7 , Aranesp 200 given 6/5 - on venofer 100 q 2 weeks - last dose 5/29 4. Secondary hyperparathyroidism - continue caclcitriol/fosrenol 5. HTN/volume - titrate volume/continue usual meds - net UF 1.7 6/5 post wt 60 no BP drop on HD- try to inch down further tomorrow. 6. Polysubstance abuse-  ?contrib to resp status 7. Dispo - OK for dc from renal standpoint   [name] MD [employer] pgr [phone]   10/25/2017, 1:34 PM       Subjective:   On dialysis, no new c/o's, feeling much better than on admission  Objective Vitals:   10/25/17 1015 10/25/17 1045 10/25/17 1115 10/25/17 1208  BP: (!) 166/56 (!) 198/88 (!) 154/68 (!) 188/89  Pulse: 92 88 78 94  Resp:    18  Temp:   98.4 F (36.9 C) 98.8 F (37.1 C)  TempSrc:   Oral Oral  SpO2:   100% 98%  Weight:   59.8 kg (131 lb 13.4 oz)   Height:       Physical Exam General: NAD Heart: RRR Gr2/6 M Lungs: crackles right base - coughs w/deep inspiration - good expansion Abdomen: soft NT Extremities: no edema Dialysis Access:  Right upper AVF + bruit- prox aneurysmal area (for revision in future)   Additional Objective Labs: Basic Metabolic Panel: Recent Labs  Lab 10/23/17 0441 10/24/17 1007 10/25/17 0747  NA 137 136 133*  K 3.3* 3.8 4.4  CL 99* 96* 95*  CO2 26 29 23   GLUCOSE 90 106* 94  BUN 39* 18 33*  CREATININE 7.72* 4.88* 6.93*  CALCIUM 8.5* 9.2 9.5  PHOS 6.1* 4.7* 4.8*   Liver Function Tests: Recent Labs  Lab  10/23/17 0441 10/24/17 1007 10/25/17 0747  ALBUMIN 2.9* 3.0* 3.1*   No results for input(s): LIPASE, AMYLASE in the last 168 hours. CBC: Recent Labs  Lab 10/21/17 1013  10/22/17 0447 10/23/17 0441 10/24/17 1007 10/25/17 0748  WBC 7.8  --  4.9 3.9* 5.2 5.7  NEUTROABS  --   --  3.6  --   --   --   HGB 9.6*   < > 8.2* 7.7* 8.9* 9.0*  HCT 31.3*   < > 26.7* 24.8* 28.3* 28.3*  MCV 103.6*  --  101.9* 102.5* 98.3 96.3  PLT 249  --  183 163 156 161   < > = values in this interval not displayed.   Blood Culture    Component Value Date/Time   SDES BLOOD LEFT HAND 10/21/2017 2110   SPECREQUEST  10/21/2017 2110    BOTTLES DRAWN AEROBIC AND ANAEROBIC Blood Culture adequate volume   CULT  10/21/2017 2110    NO GROWTH 3 DAYS Performed at [facility], [place] [address] [place],  [address]    REPTSTATUS PENDING 10/21/2017 2110    Iron Studies: No results for input(s): IRON, TIBC, TRANSFERRIN, FERRITIN in the last 72 hours. Lab Results  Component Value Date   INR 1.00  10/09/2016   INR 1.05 07/25/2014   Studies/Results: No results found. Medications: . sodium chloride     . amLODipine  10 mg Oral QHS  . aspirin EC  81 mg Oral QHS  . calcitRIOL  1.75 mcg Oral QODAY  . Chlorhexidine Gluconate Cloth  6 each Topical Q0600  . darbepoetin (ARANESP) injection - DIALYSIS  200 mcg Intravenous Q Fri-HD  . heparin  5,000 Units Subcutaneous Q8H  . isosorbide mononitrate  20 mg Oral BID  . labetalol  600 mg Oral TID  . lanthanum  2,000 mg Oral TID WC  . lisinopril  40 mg Oral BID  . multivitamin  1 tablet Oral Daily  . pantoprazole  40 mg Oral Daily  . sodium chloride flush  3 mL Intravenous Q12H

## 2017-10-25 NOTE — Discharge Summary (Signed)
Physician Discharge Summary  Taura Lamarre OVF:[id number] DOB: Apr 23, 1961 DOA: 10/21/2017  PCP: Benito Mccreedy, MD  Admit date: 10/21/2017 Discharge date: 10/25/2017  Time spent: 32 minutes  Recommendations for Outpatient Follow-up:  1. PCP in 1 week  Discharge Diagnoses:  Principal Problem:   Acute respiratory failure with hypoxia ([place]) Active Problems:   End-stage renal disease on hemodialysis (HCC)   Anemia   Volume overload   HCAP (healthcare-associated pneumonia)   Discharge Condition: Stable and improved  Diet recommendation: Renal diet  Filed Weights   10/25/17 0500 10/25/17 0700 10/25/17 1115  Weight: 60.3 kg (133 lb) 60.6 kg (133 lb 9.6 oz) 59.8 kg (131 lb 13.4 oz)     Hospital Course:  57 year old female end-stage renal disease on dialysis comes in with acute respiratory distress thought to be due to volume overload and uncontrolled hypertension.  Patient initially thought to have a component of pneumonia but this was ruled out.  Antibiotic's were stopped.  Patient has been afebrile.  Patient improved with dialysis.  By the time of her discharge today and her dialysis session today she was back to her normal status will be discharged home to continue her routine outpatient dialysis sessions.  Her labetalol was adjusted.   Consultations:  Nephrology  Discharge Exam: Vitals:   10/25/17 1115 10/25/17 1208  BP: (!) 154/68 (!) 188/89  Pulse: 78 94  Resp:  18  Temp: 98.4 F (36.9 C) 98.8 F (37.1 C)  SpO2: 100% 98%    General: Alert and oriented x4 no apparent distress cooperative and friendly Cardiovascular: Regular rate and rhythm without murmurs rubs or gallops Respiratory: Clear to auscultation bilaterally no wheezes rhonchi rales  Discharge Instructions   Discharge Instructions    Diet - low sodium heart healthy   Complete by:  As directed    Increase activity slowly   Complete by:  As directed      Allergies as of 10/25/2017   No Known  Allergies     Medication List    STOP taking these medications   lisinopril 40 MG tablet Commonly known as:  PRINIVIL,ZESTRIL     TAKE these medications   acetaminophen 500 MG tablet Commonly known as:  TYLENOL Take 1,000 mg by mouth as needed for mild pain.   amLODipine 10 MG tablet Commonly known as:  NORVASC Take 10 mg by mouth at bedtime.   aspirin EC 81 MG tablet Take 81 mg by mouth at bedtime.   calcitRIOL 0.25 MCG capsule Commonly known as:  ROCALTROL Take 7 capsules (1.75 mcg total) by mouth every other day. Start taking on:  10/27/2017   cloNIDine 0.2 MG tablet Commonly known as:  CATAPRES Take 1 tablet (0.2 mg total) by mouth 2 (two) times daily.   HYDROcodone-acetaminophen 5-325 MG tablet Commonly known as:  NORCO/VICODIN Take 1 tablet by mouth every 6 (six) hours as needed for severe pain.   labetalol 300 MG tablet Commonly known as:  NORMODYNE Take 2 tablets (600 mg total) by mouth 3 (three) times daily. What changed:  how much to take   lanthanum 1000 MG chewable tablet Commonly known as:  FOSRENOL Chew 2 tablets (2,000 mg total) by mouth 3 (three) times daily with meals.   multivitamin Tabs tablet Take 1 tablet by mouth daily.   ondansetron 4 MG tablet Commonly known as:  ZOFRAN Take 1 tablet (4 mg total) by mouth every 6 (six) hours. What changed:    when to take this  reasons to take  this   pantoprazole 40 MG tablet Commonly known as:  PROTONIX Take 1 tablet (40 mg total) by mouth 2 (two) times daily. What changed:  when to take this      No Known Allergies Follow-up Information    Osei-Bonsu, Iona Beard, MD Follow up in 1 week(s).   Specialty:  Internal Medicine Contact information: [address] [place] [address] [facility] Follow up in 1 month(s).   Contact information: [address] [place] [address] [phone]            The results of significant  diagnostics from this hospitalization (including imaging, microbiology, ancillary and laboratory) are listed below for reference.    Significant Diagnostic Studies: Dg Chest Portable 1 View  Result Date: 10/21/2017 CLINICAL DATA:  Shortness of breath and diarrhea beginning at 2 a.m. this morning. EXAM: PORTABLE CHEST 1 VIEW COMPARISON:  PA and lateral chest 01/26/2017. FINDINGS: Bilateral lower lung zone airspace disease consistent with pneumonia is worse on the right. There is cardiomegaly. No pneumothorax. No pleural effusion. No acute bony abnormality. IMPRESSION: Dense airspace disease in the lower lung zones bilaterally is worse on the right and most consistent with pneumonia. Electronically Signed   By: Inge Rise M.D.   On: 10/21/2017 10:28    Microbiology: Recent Results (from the past 240 hour(s))  Culture, blood (routine x 2) Call MD if unable to obtain prior to antibiotics being given     Status: None (Preliminary result)   Collection Time: 10/21/17  9:05 PM  Result Value Ref Range Status   Specimen Description BLOOD BLOOD LEFT FOREARM  Final   Special Requests   Final    BOTTLES DRAWN AEROBIC AND ANAEROBIC Blood Culture adequate volume   Culture   Final    NO GROWTH 3 DAYS Performed at [facility], [address] [place], [place] [address]    Report Status PENDING  Incomplete  Culture, blood (routine x 2) Call MD if unable to obtain prior to antibiotics being given     Status: None (Preliminary result)   Collection Time: 10/21/17  9:10 PM  Result Value Ref Range Status   Specimen Description BLOOD LEFT HAND  Final   Special Requests   Final    BOTTLES DRAWN AEROBIC AND ANAEROBIC Blood Culture adequate volume   Culture   Final    NO GROWTH 3 DAYS Performed at [facility], [place] [address] [place], [place] [address]    Report Status PENDING  Incomplete  MRSA PCR Screening     Status: None   Collection Time: 10/21/17 10:27 PM  Result Value Ref Range  Status   MRSA by PCR NEGATIVE NEGATIVE Final    Comment:        The GeneXpert MRSA Assay (FDA approved for NASAL specimens only), is one component of a comprehensive MRSA colonization surveillance program. It is not intended to diagnose MRSA infection nor to guide or monitor treatment for MRSA infections. Performed at [facility], [place] [address] [place], [place] [address]      Labs: Basic Metabolic Panel: Recent Labs  Lab 10/21/17 1141 10/22/17 0447 10/23/17 0441 10/24/17 1007 10/25/17 0747  NA 141 137 137 136 133*  K 3.9 3.2* 3.3* 3.8 4.4  CL 99* 95* 99* 96* 95*  CO2 27 27 26 29 23   GLUCOSE 115* 103* 90 106* 94  BUN 62* 22* 39* 18  33*  CREATININE 10.33* 5.10* 7.72* 4.88* 6.93*  CALCIUM 9.1 9.0 8.5* 9.2 9.5  MG  --   --   --  1.9  --   PHOS  --   --  6.1* 4.7* 4.8*   Liver Function Tests: Recent Labs  Lab 10/23/17 0441 10/24/17 1007 10/25/17 0747  ALBUMIN 2.9* 3.0* 3.1*   No results for input(s): LIPASE, AMYLASE in the last 168 hours. No results for input(s): AMMONIA in the last 168 hours. CBC: Recent Labs  Lab 10/21/17 1013 10/21/17 1033 10/22/17 0447 10/23/17 0441 10/24/17 1007 10/25/17 0748  WBC 7.8  --  4.9 3.9* 5.2 5.7  NEUTROABS  --   --  3.6  --   --   --   HGB 9.6* 9.9* 8.2* 7.7* 8.9* 9.0*  HCT 31.3* 29.0* 26.7* 24.8* 28.3* 28.3*  MCV 103.6*  --  101.9* 102.5* 98.3 96.3  PLT 249  --  183 163 156 161   Cardiac Enzymes: No results for input(s): CKTOTAL, CKMB, CKMBINDEX, TROPONINI in the last 168 hours. BNP: BNP (last 3 results) Recent Labs    01/26/17 2042  BNP >4,500.0*    ProBNP (last 3 results) No results for input(s): PROBNP in the last 8760 hours.  CBG: No results for input(s): GLUCAP in the last 168 hours.     Signed:  Phillips Grout MD.  [facility] 10/25/2017, 2:11 PM

## 2017-10-26 LAB — CULTURE, BLOOD (ROUTINE X 2)
CULTURE: NO GROWTH
Culture: NO GROWTH
SPECIAL REQUESTS: ADEQUATE
Special Requests: ADEQUATE

## 2017-10-28 DIAGNOSIS — M3210 Systemic lupus erythematosus, organ or system involvement unspecified: Secondary | ICD-10-CM | POA: Diagnosis not present

## 2017-10-28 DIAGNOSIS — D509 Iron deficiency anemia, unspecified: Secondary | ICD-10-CM | POA: Diagnosis not present

## 2017-10-28 DIAGNOSIS — D631 Anemia in chronic kidney disease: Secondary | ICD-10-CM | POA: Diagnosis not present

## 2017-10-28 DIAGNOSIS — N2581 Secondary hyperparathyroidism of renal origin: Secondary | ICD-10-CM | POA: Diagnosis not present

## 2017-10-28 DIAGNOSIS — N186 End stage renal disease: Secondary | ICD-10-CM | POA: Diagnosis not present

## 2017-10-29 ENCOUNTER — Encounter: Payer: Self-pay | Admitting: Physician Assistant

## 2017-10-29 ENCOUNTER — Ambulatory Visit (INDEPENDENT_AMBULATORY_CARE_PROVIDER_SITE_OTHER): Payer: Medicare Other | Admitting: Physician Assistant

## 2017-10-29 VITALS — BP 132/70 | HR 79 | Ht 66.0 in | Wt 131.4 lb

## 2017-10-29 DIAGNOSIS — I350 Nonrheumatic aortic (valve) stenosis: Secondary | ICD-10-CM

## 2017-10-29 DIAGNOSIS — R05 Cough: Secondary | ICD-10-CM

## 2017-10-29 DIAGNOSIS — I132 Hypertensive heart and chronic kidney disease with heart failure and with stage 5 chronic kidney disease, or end stage renal disease: Secondary | ICD-10-CM | POA: Diagnosis not present

## 2017-10-29 DIAGNOSIS — T82510S Breakdown (mechanical) of surgically created arteriovenous fistula, sequela: Secondary | ICD-10-CM | POA: Diagnosis not present

## 2017-10-29 DIAGNOSIS — I110 Hypertensive heart disease with heart failure: Secondary | ICD-10-CM

## 2017-10-29 DIAGNOSIS — I5032 Chronic diastolic (congestive) heart failure: Secondary | ICD-10-CM | POA: Diagnosis not present

## 2017-10-29 DIAGNOSIS — I050 Rheumatic mitral stenosis: Secondary | ICD-10-CM

## 2017-10-29 DIAGNOSIS — F17210 Nicotine dependence, cigarettes, uncomplicated: Secondary | ICD-10-CM | POA: Diagnosis not present

## 2017-10-29 DIAGNOSIS — I700 Atherosclerosis of aorta: Secondary | ICD-10-CM

## 2017-10-29 DIAGNOSIS — R059 Cough, unspecified: Secondary | ICD-10-CM

## 2017-10-29 DIAGNOSIS — N186 End stage renal disease: Secondary | ICD-10-CM

## 2017-10-29 DIAGNOSIS — Z992 Dependence on renal dialysis: Secondary | ICD-10-CM | POA: Diagnosis not present

## 2017-10-29 NOTE — Patient Instructions (Signed)
Medication Instructions:  1. Your physician recommends that you continue on your current medications as directed. Please refer to the Current Medication list given to you today.   Labwork: NONE ORDERED TODAY  Testing/Procedures: Your physician has requested that you have an echocardiogram. Echocardiography is a painless test that uses sound waves to create images of your heart. It provides your doctor with information about the size and shape of your heart and how well your heart's chambers and valves are working. This procedure takes approximately one hour. There are no restrictions for this procedure. NEEDS TO BE DONE ON A TUES OR Thursday DUE TO PT HAS DIALYSIS OTHER DAYS OF THE WEEK ; DX CHF, AORTIC STENOSIS, MITRAL STENOSIS    Follow-Up: DR. Harrington Challenger IN 3 MONTHS   Any Other Special Instructions Will Be Listed Below (If Applicable). MAKE SURE TO FOLLOW UP WITH PRIMARY CARE PHYSICIAN IN THE NEXT 2 WEEKS PER PA   If you need a refill on your cardiac medications before your next appointment, please call your pharmacy.

## 2017-10-29 NOTE — Progress Notes (Signed)
Cardiology Office Note:    Date:  10/29/2017   ID:  [name], DOB [birth year], MRN [id number]  PCP:  Benito Mccreedy, MD  Cardiologist:  Dorris Carnes, MD   Referring MD: Benito Mccreedy, MD   Chief Complaint  Patient presents with  . Hospitalization Follow-up    recent admission with volume overload, respiratory failure     History of Present Illness:    [name] is a 57 y.o. female with hx of CVA, HTN, ESRD on dialysis MWF, Lupus, dysfunctional uterine bleeding, PUD, aortic stenosis and mitral stenosis, anemia.  She was last seen in clinic 09/2016.  She was admitted with volume overload 6/3-6/7 which was managed with hemodialysis.  Her blood pressure was elevated upon presentation.  Her Lisinopril was DC'd 2/2 cough.  Her Labetalol dose was increased.    [name] returns for follow up.  She is here today with her daughter.  Since discharge, her breathing is improved.  Her cough is also improved.  She continues to note scant streaks of blood in her sputum at times.  She denies any fevers.  She sleeps on 3-4 pillows chronically without change.  She sometimes awakens in the middle of the night short of breath on the day before dialysis.  She denies chest pain or syncope.  She does admit to missing some of her blood pressure medicines prior to her admission to the hospital.  She is not certain if she is strict about limiting salt.  Prior CV studies:   The following studies were reviewed today:  Abd CTA 05/15/17 IMPRESSION: 1. Thickening of the walls of the majority of the colon, particularly prominent within the left colon, raising the possibility of a diffuse colitis of infectious or inflammatory nature. There is, however, no pericolonic fluid seen to confirm an acute colitis. Alternatively, this thickening could be chronic related to the underlying diverticulosis. Of note, there are no focal inflammatory changes to suggest acute diverticulitis at this time. No bowel  obstruction. 2. Aortic atherosclerosis. 3. Additional chronic/incidental findings detailed above.  Echo 07/24/16 Mod conc LVH, EF 60-65, no RWMA, Gr 2 DDd, bicuspid aortic valve, mild to mod AS (mean 18, peak 38), MAC, mod mitral stenosis (mean 9, peak 19), mild to mod MR, severe LAE, normal RVSF, mild RAE, mild TR  Echo 05/12/15 EF 55-60, normal wall motion, grade 2 diastolic dysfunction, mild AS (mean 14, peak 29), moderate to severe MAC, mild mitral stenosis (mean 5, peak 9), severe LAE  Myoview 4/16 EF 59, no ischemia  Echo 3/16 Mod LVH, EF 55-60, no RWMA, Gr 1 DD, mod MS (mean 9), mod LAE, mild RAE, mod to severe TR, PASP 65, trivial pericardial eff  Past Medical History:  Diagnosis Date  . Anemia   . Aortic stenosis 09/25/2016   Echo 07/24/16: Mod conc LVH, EF 60-65, no RWMA, Gr 2 DDd, bicuspid aortic valve, mild to mod AS (mean 18, peak 38), MAC, mod mitral stenosis (mean 9, peak 19), mild to mod MR, severe LAE, normal RVSF, mild RAE, mild TR  . Arthritis    "joints" (10/23/2017)  . Blood transfusion '08   [facility]; "low HgB" (10/23/2017)  . Chronic diastolic CHF (congestive heart failure) ([place])   . Dysfunctional uterine bleeding 12/19/2010  . ESRD (end stage renal disease) on dialysis [facility])    "MWF; [name]." (10/23/2017)  . GERD (gastroesophageal reflux disease)   . Headache   . Heart murmur   . Hemodialysis patient [facility])    right  extremity port  . History of hiatal hernia   . Hx of cardiovascular stress test    Lexiscan Myoview 4/16:  Normal stress nuclear study, EF 59%  . Hypertension   . Lupus ([place])    "? kind" (10/23/2017)  . Mitral stenosis    Echo 4/16:  EF 55-60%, no RWMA, Gr 1 DD, mod MS (mean 9 mmHg), mod LAE, mild RAE, PASP 65, mod to severe TR, trivial eff  . Peptic ulcer disease   . Pneumonia 10/21/2017  . Stroke [name])    per patient "they said i had a small stroke but i couldnt even tell"   Surgical Hx: The patient  has a past surgical history that includes  Dialysis fistula creation (2007); Esophagogastroduodenoscopy (N/A, 07/31/2014); Endometrial ablation; Shoulder open rotator cuff repair (Right, 10/09/2016); Colonoscopy w/ biopsies and polypectomy; Revison of arteriovenous fistula (Right, 09/26/2017); and Insertion of arteriovenous (av) artegraft arm (Right, 09/26/2017).   Current Medications: Current Meds  Medication Sig  . acetaminophen (TYLENOL) 500 MG tablet Take 1,000 mg by mouth as needed for mild pain.  [name] amLODipine (NORVASC) 10 MG tablet Take 10 mg by mouth at bedtime.   [name] aspirin EC 81 MG tablet Take 81 mg by mouth at bedtime.  . calcitRIOL (ROCALTROL) 0.25 MCG capsule Take 7 capsules (1.75 mcg total) by mouth every other day.  . cloNIDine (CATAPRES) 0.2 MG tablet Take 1 tablet (0.2 mg total) by mouth 2 (two) times daily.  [name] HYDROcodone-acetaminophen (NORCO/VICODIN) 5-325 MG tablet Take 1 tablet by mouth every 6 (six) hours as needed for severe pain.  [name] labetalol (NORMODYNE) 300 MG tablet Take 2 tablets (600 mg total) by mouth 3 (three) times daily.  [name] lanthanum (FOSRENOL) 1000 MG chewable tablet Chew 2 tablets (2,000 mg total) by mouth 3 (three) times daily with meals.  . multivitamin (RENA-VIT) TABS tablet Take 1 tablet by mouth daily.  . ondansetron (ZOFRAN) 4 MG tablet Take 4 mg by mouth every 6 (six) hours as needed for nausea or vomiting.  . pantoprazole (PROTONIX) 40 MG tablet Take 40 mg by mouth daily.     Allergies:   Patient has no known allergies.   Social History   Tobacco Use  . Smoking status: Former Smoker    Packs/day: 0.10    Years: 10.00    Pack years: 1.00    Types: Cigarettes    Start date: 10/21/2017  . Smokeless tobacco: Never Used  Substance Use Topics  . Alcohol use: Yes    Alcohol/week: 0.6 oz    Types: 1 Cans of beer per week  . Drug use: Yes    Types: Marijuana, Cocaine    Comment: 10/23/2017 "marijurana qod or so; occasionally I''ll snort cocaine"     Family Hx: The patient's family history includes  Hypertension in her father and mother; Liver cancer in her maternal grandmother; Lymphoma in her maternal aunt; Renal Disease in her father. There is no history of Heart attack.  ROS:   Please see the history of present illness.    Review of Systems  Cardiovascular: Positive for palpitations.  Respiratory: Positive for cough, shortness of breath and snoring.   Gastrointestinal: Positive for constipation and nausea.   All other systems reviewed and are negative.   EKGs/Labs/Other Test Reviewed:    EKG:  EKG is  ordered today.  The ekg ordered today demonstrates normal sinus rhythm, heart rate 79, normal axis, LVH, QTC 474, similar to prior tracing dated 09/25/2016  Recent Labs: 01/26/2017:  B Natriuretic Peptide >4,500.0 10/14/2017: ALT 18 10/24/2017: Magnesium 1.9 10/25/2017: BUN 33; Creatinine, Ser 6.93; Hemoglobin 9.0; Platelets 161; Potassium 4.4; Sodium 133   Recent Lipid Panel Lab Results  Component Value Date/Time   CHOL 187 02/23/2016 10:40 AM   TRIG 89 02/23/2016 10:40 AM   HDL 55 02/23/2016 10:40 AM   CHOLHDL 3.4 02/23/2016 10:40 AM   LDLCALC 114 02/23/2016 10:40 AM    Physical Exam:    VS:  BP 132/70   Pulse 79   Ht _0  (1.676 m)   Wt 131 lb 6.4 oz (59.6 kg)   LMP 12/05/2010 (LMP Unknown)   BMI 21.21 kg/m     Wt Readings from Last 3 Encounters:  10/29/17 131 lb 6.4 oz (59.6 kg)  10/25/17 131 lb 13.4 oz (59.8 kg)  10/18/17 133 lb (60.3 kg)     Physical Exam  Constitutional: She is oriented to person, place, and time. She appears well-developed and well-nourished. No distress.  HENT:  Head: Normocephalic and atraumatic.  Neck: JVD present. Carotid bruit is not present.  Cardiovascular: Normal rate and regular rhythm.  Murmur heard.  Harsh crescendo-decrescendo systolic murmur is present with a grade of 3/6 at the upper right sternal border.  Diastolic murmur is present with a grade of 1/6 at the apex. Pulmonary/Chest: Effort normal and breath sounds normal. She  has no rales.  Abdominal: Soft. There is no hepatomegaly.  Musculoskeletal: She exhibits no edema.  Neurological: She is alert and oriented to person, place, and time.  Skin: Skin is warm and dry.    ASSESSMENT & PLAN:    Hypertensive heart disease with chronic diastolic congestive heart failure ([place]) Blood pressure is currently controlled.  She was taken off of ACE inhibitor during her hospitalization for cough.  Continue current therapy.  If her pressure starts to increase, she could be started on ARB versus hydralazine.  Volume is managed by dialysis.  I suspect that her decompensated heart failure was likely related to uncontrolled blood pressure in the setting of missed medication.  However, we should proceed with follow-up echocardiogram to rule out worsening mitral stenosis.  Aortic valve stenosis, etiology of cardiac valve disease unspecified  Mean gradient 18 by echocardiogram in 2018.  Arrange follow-up echo.  Mitral valve stenosis, unspecified etiology  Moderate mitral stenosis by echo March 2018 with mean gradient 9.  Arrange follow-up echo.  ESRD needing dialysis [facility]) She remains on Monday, Wednesday, Friday dialysis.  Aortic atherosclerosis (HCC)   Continue aspirin.  Cough She notes some scant hemoptysis.  This seems to be resolving.  I have asked her to follow up with her PCP in the next 2 weeks. If this continues she may need a repeat CXR.   Dispo:  Return in about 3 months (around 01/29/2018) for Routine Follow Up, w/ Dr. Harrington Challenger.   Medication Adjustments/Labs and Tests Ordered: Current medicines are reviewed at length with the patient today.  Concerns regarding medicines are outlined above.  Tests Ordered: Orders Placed This Encounter  Procedures  . EKG 12-Lead  . ECHOCARDIOGRAM COMPLETE   Medication Changes: No orders of the defined types were placed in this encounter.   Signed, Richardson Dopp, PA-C  10/29/2017 3:49 PM    [facility]  [facility] [place], [place], [place]  [address] Phone: [phone]; Fax: [phone]

## 2017-10-30 DIAGNOSIS — D631 Anemia in chronic kidney disease: Secondary | ICD-10-CM | POA: Diagnosis not present

## 2017-10-30 DIAGNOSIS — N186 End stage renal disease: Secondary | ICD-10-CM | POA: Diagnosis not present

## 2017-10-30 DIAGNOSIS — M3210 Systemic lupus erythematosus, organ or system involvement unspecified: Secondary | ICD-10-CM | POA: Diagnosis not present

## 2017-10-30 DIAGNOSIS — N2581 Secondary hyperparathyroidism of renal origin: Secondary | ICD-10-CM | POA: Diagnosis not present

## 2017-10-30 DIAGNOSIS — D509 Iron deficiency anemia, unspecified: Secondary | ICD-10-CM | POA: Diagnosis not present

## 2017-11-01 DIAGNOSIS — M3210 Systemic lupus erythematosus, organ or system involvement unspecified: Secondary | ICD-10-CM | POA: Diagnosis not present

## 2017-11-01 DIAGNOSIS — D509 Iron deficiency anemia, unspecified: Secondary | ICD-10-CM | POA: Diagnosis not present

## 2017-11-01 DIAGNOSIS — N186 End stage renal disease: Secondary | ICD-10-CM | POA: Diagnosis not present

## 2017-11-01 DIAGNOSIS — N2581 Secondary hyperparathyroidism of renal origin: Secondary | ICD-10-CM | POA: Diagnosis not present

## 2017-11-01 DIAGNOSIS — D631 Anemia in chronic kidney disease: Secondary | ICD-10-CM | POA: Diagnosis not present

## 2017-11-02 ENCOUNTER — Other Ambulatory Visit: Payer: Self-pay | Admitting: Physician Assistant

## 2017-11-02 DIAGNOSIS — I110 Hypertensive heart disease with heart failure: Secondary | ICD-10-CM

## 2017-11-02 DIAGNOSIS — I5032 Chronic diastolic (congestive) heart failure: Principal | ICD-10-CM

## 2017-11-04 DIAGNOSIS — M3210 Systemic lupus erythematosus, organ or system involvement unspecified: Secondary | ICD-10-CM | POA: Diagnosis not present

## 2017-11-04 DIAGNOSIS — D509 Iron deficiency anemia, unspecified: Secondary | ICD-10-CM | POA: Diagnosis not present

## 2017-11-04 DIAGNOSIS — N186 End stage renal disease: Secondary | ICD-10-CM | POA: Diagnosis not present

## 2017-11-04 DIAGNOSIS — D631 Anemia in chronic kidney disease: Secondary | ICD-10-CM | POA: Diagnosis not present

## 2017-11-04 DIAGNOSIS — N2581 Secondary hyperparathyroidism of renal origin: Secondary | ICD-10-CM | POA: Diagnosis not present

## 2017-11-05 ENCOUNTER — Ambulatory Visit (HOSPITAL_COMMUNITY): Payer: Medicare Other | Attending: Cardiovascular Disease

## 2017-11-05 ENCOUNTER — Other Ambulatory Visit: Payer: Self-pay

## 2017-11-05 DIAGNOSIS — I083 Combined rheumatic disorders of mitral, aortic and tricuspid valves: Secondary | ICD-10-CM | POA: Insufficient documentation

## 2017-11-05 DIAGNOSIS — I5032 Chronic diastolic (congestive) heart failure: Secondary | ICD-10-CM | POA: Diagnosis not present

## 2017-11-05 DIAGNOSIS — I050 Rheumatic mitral stenosis: Secondary | ICD-10-CM

## 2017-11-05 DIAGNOSIS — I110 Hypertensive heart disease with heart failure: Secondary | ICD-10-CM | POA: Diagnosis not present

## 2017-11-05 DIAGNOSIS — N186 End stage renal disease: Secondary | ICD-10-CM | POA: Diagnosis not present

## 2017-11-05 DIAGNOSIS — I132 Hypertensive heart and chronic kidney disease with heart failure and with stage 5 chronic kidney disease, or end stage renal disease: Secondary | ICD-10-CM | POA: Insufficient documentation

## 2017-11-05 DIAGNOSIS — T82510S Breakdown (mechanical) of surgically created arteriovenous fistula, sequela: Secondary | ICD-10-CM | POA: Diagnosis not present

## 2017-11-05 DIAGNOSIS — M329 Systemic lupus erythematosus, unspecified: Secondary | ICD-10-CM | POA: Diagnosis not present

## 2017-11-05 DIAGNOSIS — I350 Nonrheumatic aortic (valve) stenosis: Secondary | ICD-10-CM | POA: Diagnosis not present

## 2017-11-05 DIAGNOSIS — Z8673 Personal history of transient ischemic attack (TIA), and cerebral infarction without residual deficits: Secondary | ICD-10-CM | POA: Insufficient documentation

## 2017-11-05 DIAGNOSIS — F17210 Nicotine dependence, cigarettes, uncomplicated: Secondary | ICD-10-CM | POA: Diagnosis not present

## 2017-11-05 DIAGNOSIS — Z992 Dependence on renal dialysis: Secondary | ICD-10-CM | POA: Diagnosis not present

## 2017-11-06 DIAGNOSIS — D509 Iron deficiency anemia, unspecified: Secondary | ICD-10-CM | POA: Diagnosis not present

## 2017-11-06 DIAGNOSIS — M3210 Systemic lupus erythematosus, organ or system involvement unspecified: Secondary | ICD-10-CM | POA: Diagnosis not present

## 2017-11-06 DIAGNOSIS — N186 End stage renal disease: Secondary | ICD-10-CM | POA: Diagnosis not present

## 2017-11-06 DIAGNOSIS — N2581 Secondary hyperparathyroidism of renal origin: Secondary | ICD-10-CM | POA: Diagnosis not present

## 2017-11-06 DIAGNOSIS — D631 Anemia in chronic kidney disease: Secondary | ICD-10-CM | POA: Diagnosis not present

## 2017-11-07 DIAGNOSIS — F17210 Nicotine dependence, cigarettes, uncomplicated: Secondary | ICD-10-CM | POA: Diagnosis not present

## 2017-11-07 DIAGNOSIS — N186 End stage renal disease: Secondary | ICD-10-CM | POA: Diagnosis not present

## 2017-11-07 DIAGNOSIS — Z992 Dependence on renal dialysis: Secondary | ICD-10-CM | POA: Diagnosis not present

## 2017-11-07 DIAGNOSIS — I132 Hypertensive heart and chronic kidney disease with heart failure and with stage 5 chronic kidney disease, or end stage renal disease: Secondary | ICD-10-CM | POA: Diagnosis not present

## 2017-11-07 DIAGNOSIS — I5032 Chronic diastolic (congestive) heart failure: Secondary | ICD-10-CM | POA: Diagnosis not present

## 2017-11-07 DIAGNOSIS — T82510S Breakdown (mechanical) of surgically created arteriovenous fistula, sequela: Secondary | ICD-10-CM | POA: Diagnosis not present

## 2017-11-08 DIAGNOSIS — D509 Iron deficiency anemia, unspecified: Secondary | ICD-10-CM | POA: Diagnosis not present

## 2017-11-08 DIAGNOSIS — D631 Anemia in chronic kidney disease: Secondary | ICD-10-CM | POA: Diagnosis not present

## 2017-11-08 DIAGNOSIS — N186 End stage renal disease: Secondary | ICD-10-CM | POA: Diagnosis not present

## 2017-11-08 DIAGNOSIS — N2581 Secondary hyperparathyroidism of renal origin: Secondary | ICD-10-CM | POA: Diagnosis not present

## 2017-11-08 DIAGNOSIS — M3210 Systemic lupus erythematosus, organ or system involvement unspecified: Secondary | ICD-10-CM | POA: Diagnosis not present

## 2017-11-11 ENCOUNTER — Encounter: Payer: Self-pay | Admitting: Physician Assistant

## 2017-11-11 DIAGNOSIS — N2581 Secondary hyperparathyroidism of renal origin: Secondary | ICD-10-CM | POA: Diagnosis not present

## 2017-11-11 DIAGNOSIS — D631 Anemia in chronic kidney disease: Secondary | ICD-10-CM | POA: Diagnosis not present

## 2017-11-11 DIAGNOSIS — N186 End stage renal disease: Secondary | ICD-10-CM | POA: Diagnosis not present

## 2017-11-11 DIAGNOSIS — M3210 Systemic lupus erythematosus, organ or system involvement unspecified: Secondary | ICD-10-CM | POA: Diagnosis not present

## 2017-11-11 DIAGNOSIS — D509 Iron deficiency anemia, unspecified: Secondary | ICD-10-CM | POA: Diagnosis not present

## 2017-11-12 ENCOUNTER — Telehealth: Payer: Self-pay | Admitting: *Deleted

## 2017-11-12 NOTE — Telephone Encounter (Signed)
-----   Message from Liliane Shi, [place] sent at 11/11/2017  9:03 PM EDT ----- Please tell patient her echocardiogram shows that her ejection fraction is normal.  There is moderately impaired relaxation.  The aortic valve stenosis is not significantly changed from the last echo.  But, the mitral valve stenosis appears worse.  I will have Dr. Harrington Challenger look at this and we will be in touch with further recommendations.  Richardson Dopp, PA-C    11/11/2017 9:02 PM

## 2017-11-12 NOTE — Telephone Encounter (Signed)
Left message to go over echo results. 

## 2017-11-13 DIAGNOSIS — D631 Anemia in chronic kidney disease: Secondary | ICD-10-CM | POA: Diagnosis not present

## 2017-11-13 DIAGNOSIS — N186 End stage renal disease: Secondary | ICD-10-CM | POA: Diagnosis not present

## 2017-11-13 DIAGNOSIS — M3210 Systemic lupus erythematosus, organ or system involvement unspecified: Secondary | ICD-10-CM | POA: Diagnosis not present

## 2017-11-13 DIAGNOSIS — D509 Iron deficiency anemia, unspecified: Secondary | ICD-10-CM | POA: Diagnosis not present

## 2017-11-13 DIAGNOSIS — N2581 Secondary hyperparathyroidism of renal origin: Secondary | ICD-10-CM | POA: Diagnosis not present

## 2017-11-13 NOTE — Telephone Encounter (Signed)
New Message: ° ° ° ° ° °Pt is returning a call for lab results. °

## 2017-11-13 NOTE — Telephone Encounter (Signed)
Pt has been notified of echo results and findings by phone with verbal understanding. Pt aware awaiting for Dr. Harrington Challenger review as well. Advised I will call back once I have her recommendations regardless if change in Tx or no change in Tx. Pt thanked me for the call. I will forward a copy of results to PCP Dr. Iona Beard Osei-Bonsu.

## 2017-11-13 NOTE — Telephone Encounter (Signed)
-----   Message from Liliane Shi, [place] sent at 11/11/2017  9:03 PM EDT ----- Please tell patient her echocardiogram shows that her ejection fraction is normal.  There is moderately impaired relaxation.  The aortic valve stenosis is not significantly changed from the last echo.  But, the mitral valve stenosis appears worse.  I will have Dr. Harrington Challenger look at this and we will be in touch with further recommendations.  Richardson Dopp, PA-C    11/11/2017 9:02 PM

## 2017-11-14 DIAGNOSIS — N186 End stage renal disease: Secondary | ICD-10-CM | POA: Diagnosis not present

## 2017-11-14 DIAGNOSIS — T82510S Breakdown (mechanical) of surgically created arteriovenous fistula, sequela: Secondary | ICD-10-CM | POA: Diagnosis not present

## 2017-11-14 DIAGNOSIS — Z992 Dependence on renal dialysis: Secondary | ICD-10-CM | POA: Diagnosis not present

## 2017-11-14 DIAGNOSIS — F17210 Nicotine dependence, cigarettes, uncomplicated: Secondary | ICD-10-CM | POA: Diagnosis not present

## 2017-11-14 DIAGNOSIS — I132 Hypertensive heart and chronic kidney disease with heart failure and with stage 5 chronic kidney disease, or end stage renal disease: Secondary | ICD-10-CM | POA: Diagnosis not present

## 2017-11-14 DIAGNOSIS — I5032 Chronic diastolic (congestive) heart failure: Secondary | ICD-10-CM | POA: Diagnosis not present

## 2017-11-15 DIAGNOSIS — D631 Anemia in chronic kidney disease: Secondary | ICD-10-CM | POA: Diagnosis not present

## 2017-11-15 DIAGNOSIS — D509 Iron deficiency anemia, unspecified: Secondary | ICD-10-CM | POA: Diagnosis not present

## 2017-11-15 DIAGNOSIS — N2581 Secondary hyperparathyroidism of renal origin: Secondary | ICD-10-CM | POA: Diagnosis not present

## 2017-11-15 DIAGNOSIS — M3210 Systemic lupus erythematosus, organ or system involvement unspecified: Secondary | ICD-10-CM | POA: Diagnosis not present

## 2017-11-15 DIAGNOSIS — N186 End stage renal disease: Secondary | ICD-10-CM | POA: Diagnosis not present

## 2017-11-18 DIAGNOSIS — N186 End stage renal disease: Secondary | ICD-10-CM | POA: Diagnosis not present

## 2017-11-18 DIAGNOSIS — I120 Hypertensive chronic kidney disease with stage 5 chronic kidney disease or end stage renal disease: Secondary | ICD-10-CM | POA: Diagnosis not present

## 2017-11-18 DIAGNOSIS — M3210 Systemic lupus erythematosus, organ or system involvement unspecified: Secondary | ICD-10-CM | POA: Diagnosis not present

## 2017-11-18 DIAGNOSIS — N2581 Secondary hyperparathyroidism of renal origin: Secondary | ICD-10-CM | POA: Diagnosis not present

## 2017-11-18 DIAGNOSIS — D631 Anemia in chronic kidney disease: Secondary | ICD-10-CM | POA: Diagnosis not present

## 2017-11-18 DIAGNOSIS — Z992 Dependence on renal dialysis: Secondary | ICD-10-CM | POA: Diagnosis not present

## 2017-11-18 DIAGNOSIS — D509 Iron deficiency anemia, unspecified: Secondary | ICD-10-CM | POA: Diagnosis not present

## 2017-11-20 ENCOUNTER — Telehealth: Payer: Self-pay | Admitting: Physician Assistant

## 2017-11-20 DIAGNOSIS — N186 End stage renal disease: Secondary | ICD-10-CM | POA: Diagnosis not present

## 2017-11-20 DIAGNOSIS — N2581 Secondary hyperparathyroidism of renal origin: Secondary | ICD-10-CM | POA: Diagnosis not present

## 2017-11-20 DIAGNOSIS — D509 Iron deficiency anemia, unspecified: Secondary | ICD-10-CM | POA: Diagnosis not present

## 2017-11-20 DIAGNOSIS — D631 Anemia in chronic kidney disease: Secondary | ICD-10-CM | POA: Diagnosis not present

## 2017-11-20 DIAGNOSIS — M3210 Systemic lupus erythematosus, organ or system involvement unspecified: Secondary | ICD-10-CM | POA: Diagnosis not present

## 2017-11-20 NOTE — Telephone Encounter (Signed)
See echo results from 11/05/17. Please make sure she is getting a follow up with Dr. Dorris Carnes in July to discuss her echo findings. Richardson Dopp, PA-C    11/20/2017 5:40 PM

## 2017-11-22 DIAGNOSIS — D631 Anemia in chronic kidney disease: Secondary | ICD-10-CM | POA: Diagnosis not present

## 2017-11-22 DIAGNOSIS — N2581 Secondary hyperparathyroidism of renal origin: Secondary | ICD-10-CM | POA: Diagnosis not present

## 2017-11-22 DIAGNOSIS — M3210 Systemic lupus erythematosus, organ or system involvement unspecified: Secondary | ICD-10-CM | POA: Diagnosis not present

## 2017-11-22 DIAGNOSIS — D509 Iron deficiency anemia, unspecified: Secondary | ICD-10-CM | POA: Diagnosis not present

## 2017-11-22 DIAGNOSIS — N186 End stage renal disease: Secondary | ICD-10-CM | POA: Diagnosis not present

## 2017-11-25 DIAGNOSIS — D509 Iron deficiency anemia, unspecified: Secondary | ICD-10-CM | POA: Diagnosis not present

## 2017-11-25 DIAGNOSIS — D631 Anemia in chronic kidney disease: Secondary | ICD-10-CM | POA: Diagnosis not present

## 2017-11-25 DIAGNOSIS — N2581 Secondary hyperparathyroidism of renal origin: Secondary | ICD-10-CM | POA: Diagnosis not present

## 2017-11-25 DIAGNOSIS — N186 End stage renal disease: Secondary | ICD-10-CM | POA: Diagnosis not present

## 2017-11-25 DIAGNOSIS — M3210 Systemic lupus erythematosus, organ or system involvement unspecified: Secondary | ICD-10-CM | POA: Diagnosis not present

## 2017-11-26 NOTE — Telephone Encounter (Signed)
Spoke with patient. Scheduled with Dr. Harrington Challenger on 12/10/17 for follow up of echo.

## 2017-11-27 DIAGNOSIS — D631 Anemia in chronic kidney disease: Secondary | ICD-10-CM | POA: Diagnosis not present

## 2017-11-27 DIAGNOSIS — N186 End stage renal disease: Secondary | ICD-10-CM | POA: Diagnosis not present

## 2017-11-27 DIAGNOSIS — N2581 Secondary hyperparathyroidism of renal origin: Secondary | ICD-10-CM | POA: Diagnosis not present

## 2017-11-27 DIAGNOSIS — M3210 Systemic lupus erythematosus, organ or system involvement unspecified: Secondary | ICD-10-CM | POA: Diagnosis not present

## 2017-11-27 DIAGNOSIS — D509 Iron deficiency anemia, unspecified: Secondary | ICD-10-CM | POA: Diagnosis not present

## 2017-11-29 DIAGNOSIS — D631 Anemia in chronic kidney disease: Secondary | ICD-10-CM | POA: Diagnosis not present

## 2017-11-29 DIAGNOSIS — M3210 Systemic lupus erythematosus, organ or system involvement unspecified: Secondary | ICD-10-CM | POA: Diagnosis not present

## 2017-11-29 DIAGNOSIS — N2581 Secondary hyperparathyroidism of renal origin: Secondary | ICD-10-CM | POA: Diagnosis not present

## 2017-11-29 DIAGNOSIS — D509 Iron deficiency anemia, unspecified: Secondary | ICD-10-CM | POA: Diagnosis not present

## 2017-11-29 DIAGNOSIS — N186 End stage renal disease: Secondary | ICD-10-CM | POA: Diagnosis not present

## 2017-12-02 DIAGNOSIS — M3210 Systemic lupus erythematosus, organ or system involvement unspecified: Secondary | ICD-10-CM | POA: Diagnosis not present

## 2017-12-02 DIAGNOSIS — D509 Iron deficiency anemia, unspecified: Secondary | ICD-10-CM | POA: Diagnosis not present

## 2017-12-02 DIAGNOSIS — N2581 Secondary hyperparathyroidism of renal origin: Secondary | ICD-10-CM | POA: Diagnosis not present

## 2017-12-02 DIAGNOSIS — D631 Anemia in chronic kidney disease: Secondary | ICD-10-CM | POA: Diagnosis not present

## 2017-12-02 DIAGNOSIS — N186 End stage renal disease: Secondary | ICD-10-CM | POA: Diagnosis not present

## 2017-12-04 DIAGNOSIS — N186 End stage renal disease: Secondary | ICD-10-CM | POA: Diagnosis not present

## 2017-12-04 DIAGNOSIS — D509 Iron deficiency anemia, unspecified: Secondary | ICD-10-CM | POA: Diagnosis not present

## 2017-12-04 DIAGNOSIS — M3210 Systemic lupus erythematosus, organ or system involvement unspecified: Secondary | ICD-10-CM | POA: Diagnosis not present

## 2017-12-04 DIAGNOSIS — N2581 Secondary hyperparathyroidism of renal origin: Secondary | ICD-10-CM | POA: Diagnosis not present

## 2017-12-04 DIAGNOSIS — D631 Anemia in chronic kidney disease: Secondary | ICD-10-CM | POA: Diagnosis not present

## 2017-12-06 DIAGNOSIS — D509 Iron deficiency anemia, unspecified: Secondary | ICD-10-CM | POA: Diagnosis not present

## 2017-12-06 DIAGNOSIS — M3210 Systemic lupus erythematosus, organ or system involvement unspecified: Secondary | ICD-10-CM | POA: Diagnosis not present

## 2017-12-06 DIAGNOSIS — N186 End stage renal disease: Secondary | ICD-10-CM | POA: Diagnosis not present

## 2017-12-06 DIAGNOSIS — D631 Anemia in chronic kidney disease: Secondary | ICD-10-CM | POA: Diagnosis not present

## 2017-12-06 DIAGNOSIS — N2581 Secondary hyperparathyroidism of renal origin: Secondary | ICD-10-CM | POA: Diagnosis not present

## 2017-12-09 DIAGNOSIS — N2581 Secondary hyperparathyroidism of renal origin: Secondary | ICD-10-CM | POA: Diagnosis not present

## 2017-12-09 DIAGNOSIS — M3210 Systemic lupus erythematosus, organ or system involvement unspecified: Secondary | ICD-10-CM | POA: Diagnosis not present

## 2017-12-09 DIAGNOSIS — N186 End stage renal disease: Secondary | ICD-10-CM | POA: Diagnosis not present

## 2017-12-09 DIAGNOSIS — D631 Anemia in chronic kidney disease: Secondary | ICD-10-CM | POA: Diagnosis not present

## 2017-12-09 DIAGNOSIS — D509 Iron deficiency anemia, unspecified: Secondary | ICD-10-CM | POA: Diagnosis not present

## 2017-12-10 ENCOUNTER — Encounter: Payer: Self-pay | Admitting: Internal Medicine

## 2017-12-10 ENCOUNTER — Encounter (INDEPENDENT_AMBULATORY_CARE_PROVIDER_SITE_OTHER): Payer: Self-pay

## 2017-12-10 ENCOUNTER — Ambulatory Visit (INDEPENDENT_AMBULATORY_CARE_PROVIDER_SITE_OTHER): Payer: Medicare Other | Admitting: Internal Medicine

## 2017-12-10 VITALS — BP 142/80 | HR 63 | Ht 66.0 in | Wt 131.1 lb

## 2017-12-10 DIAGNOSIS — N186 End stage renal disease: Secondary | ICD-10-CM | POA: Diagnosis not present

## 2017-12-10 DIAGNOSIS — I342 Nonrheumatic mitral (valve) stenosis: Secondary | ICD-10-CM | POA: Diagnosis not present

## 2017-12-10 DIAGNOSIS — I350 Nonrheumatic aortic (valve) stenosis: Secondary | ICD-10-CM | POA: Diagnosis not present

## 2017-12-10 DIAGNOSIS — Z992 Dependence on renal dialysis: Secondary | ICD-10-CM

## 2017-12-10 DIAGNOSIS — I10 Essential (primary) hypertension: Secondary | ICD-10-CM | POA: Diagnosis not present

## 2017-12-10 NOTE — Progress Notes (Signed)
Cardiology Office Note:    Date:  12/10/2017   ID:  [name], DOB [birth year], MRN [id number]  PCP:  Benito Mccreedy, MD  Cardiologist:  Dorris Carnes, MD   Referring MD: Benito Mccreedy, MD    Pt presents for f/u of valvular heart disease    History of Present Illness:    [name] is a 57 y.o. female with hx of CVA, HTN, ESRD on dialysis MWF, Lupus, dysfunctional uterine bleeding, PUD, aortic stenosis and mitral stenosis, anemia.  She was admitted with resp distres, volume overload 6/3-6/7 which was managed with hemodialysis.She was in rapid atrial fib    Her blood pressure was elevated upon presentation.  Did admit to missing some of her meds prior to admit Her Lisinopril was DC'd 2/2 cough.  Her Labetalol dose was increased.    She was seen by Kathleen Argue in follow up in June  Doing OK   Some SOB prior to dialysis.   Echo was ordered  This was done on November 05, 2017 LVEF wsa normal   THe aortic valve was mild to moderately narrowed  Mean gradient was 20 mm Hg Gradients across MV were 25 and 17 mm Hg   This was increased from previous echo   Valve appears to be severely narrowed    Since seen the pt says her breathing is fair   She deneis palpitations    She did not have spell like in hospital this spring   No palpitations    Prior CV studies:   The following studies were reviewed today:  Abd CTA 05/15/17 IMPRESSION: 1. Thickening of the walls of the majority of the colon, particularly prominent within the left colon, raising the possibility of a diffuse colitis of infectious or inflammatory nature. There is, however, no pericolonic fluid seen to confirm an acute colitis. Alternatively, this thickening could be chronic related to the underlying diverticulosis. Of note, there are no focal inflammatory changes to suggest acute diverticulitis at this time. No bowel obstruction. 2. Aortic atherosclerosis. 3. Additional chronic/incidental findings detailed above.  Echo  07/24/16 Mod conc LVH, EF 60-65, no RWMA, Gr 2 DDd, bicuspid aortic valve, mild to mod AS (mean 18, peak 38), MAC, mod mitral stenosis (mean 9, peak 19), mild to mod MR, severe LAE, normal RVSF, mild RAE, mild TR  Echo 05/12/15 EF 55-60, normal wall motion, grade 2 diastolic dysfunction, mild AS (mean 14, peak 29), moderate to severe MAC, mild mitral stenosis (mean 5, peak 9), severe LAE  Myoview 4/16 EF 59, no ischemia  Echo 3/16 Mod LVH, EF 55-60, no RWMA, Gr 1 DD, mod MS (mean 9), mod LAE, mild RAE, mod to severe TR, PASP 65, trivial pericardial eff  Past Medical History:  Diagnosis Date  . Anemia   . Aortic stenosis 09/25/2016   Echo 07/24/16: Mod conc LVH, EF 60-65, no RWMA, Gr 2 DDd, bicuspid aortic valve, mild to mod AS (mean 18, peak 38), MAC, mod mitral stenosis (mean 9, peak 19), mild to mod MR, severe LAE, normal RVSF, mild RAE, mild TR  . Arthritis    "joints" (10/23/2017)  . Blood transfusion '08   [facility]; "low HgB" (10/23/2017)  . Chronic diastolic CHF (congestive heart failure) ([place])   . Dysfunctional uterine bleeding 12/19/2010  . ESRD (end stage renal disease) on dialysis [facility])    "MWF; [name]." (10/23/2017)  . GERD (gastroesophageal reflux disease)   . Headache   . Heart murmur   . Hemodialysis patient ([place])  right extremity port  . History of hiatal hernia   . Hx of cardiovascular stress test    Lexiscan Myoview 4/16:  Normal stress nuclear study, EF 59%  . Hypertension   . Lupus ([place])    "? kind" (10/23/2017)  . Mitral stenosis    Echo 4/16:  EF 55-60%, no RWMA, Gr 1 DD, mod MS (mean 9 mmHg), mod LAE, mild RAE, PASP 65, mod to severe TR, trivial eff // Echo 6/19:  Mild LVH, EF 55-60, no RWMA, Gr 2 DD, mild to mod AS (Mean 20), severe MS (mean 17), massive LAE, PASP 48, trivial effusion   . Peptic ulcer disease   . Pneumonia 10/21/2017  . Stroke [facility])    per patient "they said i had a small stroke but i couldnt even tell"   Surgical Hx: The patient  has a past  surgical history that includes Dialysis fistula creation (2007); Esophagogastroduodenoscopy (N/A, 07/31/2014); Endometrial ablation; Shoulder open rotator cuff repair (Right, 10/09/2016); Colonoscopy w/ biopsies and polypectomy; Revison of arteriovenous fistula (Right, 09/26/2017); and Insertion of arteriovenous (av) artegraft arm (Right, 09/26/2017).   Current Medications: Current Meds  Medication Sig  . acetaminophen (TYLENOL) 500 MG tablet Take 1,000 mg by mouth as needed for mild pain.  [name] amLODipine (NORVASC) 10 MG tablet Take 10 mg by mouth at bedtime.   [name] aspirin EC 81 MG tablet Take 81 mg by mouth at bedtime.  . calcitRIOL (ROCALTROL) 0.25 MCG capsule Take 7 capsules (1.75 mcg total) by mouth every other day.  . cloNIDine (CATAPRES) 0.2 MG tablet TAKE 1 TABLET(0.2 MG) BY MOUTH TWICE DAILY  . HYDROcodone-acetaminophen (NORCO/VICODIN) 5-325 MG tablet Take 1 tablet by mouth every 6 (six) hours as needed for severe pain.  [name] labetalol (NORMODYNE) 300 MG tablet Take 2 tablets (600 mg total) by mouth 3 (three) times daily.  [name] lanthanum (FOSRENOL) 1000 MG chewable tablet Chew 2 tablets (2,000 mg total) by mouth 3 (three) times daily with meals.  . multivitamin (RENA-VIT) TABS tablet Take 1 tablet by mouth daily.  . ondansetron (ZOFRAN) 4 MG tablet Take 4 mg by mouth every 6 (six) hours as needed for nausea or vomiting.  . pantoprazole (PROTONIX) 40 MG tablet Take 40 mg by mouth daily.     Allergies:   Patient has no known allergies.   Social History   Tobacco Use  . Smoking status: Former Smoker    Packs/day: 0.10    Years: 10.00    Pack years: 1.00    Types: Cigarettes    Start date: 10/21/2017  . Smokeless tobacco: Never Used  Substance Use Topics  . Alcohol use: Yes    Alcohol/week: 0.6 oz    Types: 1 Cans of beer per week  . Drug use: Yes    Types: Marijuana, Cocaine    Comment: 10/23/2017 "marijurana qod or so; occasionally I''ll snort cocaine"     Family Hx: The patient's family  history includes Hypertension in her father and mother; Liver cancer in her maternal grandmother; Lymphoma in her maternal aunt; Renal Disease in her father. There is no history of Heart attack.  ROS:   Please see the history of present illness.    Review of Systems  Cardiovascular: Positive for palpitations.  Respiratory: Positive for cough, shortness of breath and snoring.   Gastrointestinal: Positive for constipation and nausea.   All other systems reviewed and are negative.   EKGs/Labs/Other Test Reviewed:    EKG:  EKG is  ordered today.  The ekg ordered today demonstrates normal sinus rhythm, heart rate 79, normal axis, LVH, QTC 474, similar to prior tracing dated 09/25/2016  Recent Labs: 01/26/2017: B Natriuretic Peptide >4,500.0 10/14/2017: ALT 18 10/24/2017: Magnesium 1.9 10/25/2017: BUN 33; Creatinine, Ser 6.93; Hemoglobin 9.0; Platelets 161; Potassium 4.4; Sodium 133   Recent Lipid Panel Lab Results  Component Value Date/Time   CHOL 187 02/23/2016 10:40 AM   TRIG 89 02/23/2016 10:40 AM   HDL 55 02/23/2016 10:40 AM   CHOLHDL 3.4 02/23/2016 10:40 AM   LDLCALC 114 02/23/2016 10:40 AM    Physical Exam:    VS:  BP (!) 142/80 (BP Location: Left Arm, Patient Position: Sitting, Cuff Size: Normal)   Pulse 63   Ht _0  (1.676 m)   Wt 131 lb 1.9 oz (59.5 kg)   LMP 12/05/2010 (LMP Unknown)   SpO2 99%   BMI 21.16 kg/m     Wt Readings from Last 3 Encounters:  12/10/17 131 lb 1.9 oz (59.5 kg)  10/29/17 131 lb 6.4 oz (59.6 kg)  10/25/17 131 lb 13.4 oz (59.8 kg)    PE   PT is in NAD  HEENT:   NCAT Neck   JVP is normal   No bruits Lungs are rel clear   Cardiac exam:   RRR Normal S1, S2  No S3   Gr II/VI systlioic murmurs   Abd   Soft   nontender Ext show trace edema     ASSESSMENT & PLAN:     1  Valvular heart disease   I have reviewed echo with pt   AV is not critically narrowed   MV gradients are higher, sugg worsenng MS   (severe) The pt was admitted earlier this spring  with HTN and volume overload   Admits to missing some meds     Volume status is not bad  Now.     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) She remains on Monday, Wednesday, Friday dialysis.  Aortic atherosclerosis (HCC)   Continue aspirin.  Dispo:  No follow-ups on file.   Medication Adjustments/Labs and Tests Ordered: Current medicines are reviewed at length with the patient today.  Concerns regarding medicines are outlined above.  Tests Ordered: No orders of the defined types were placed in this encounter.  Medication Changes: No orders of the defined types were placed in this encounter.   Signed, Dorris Carnes, MD  12/10/2017 10:39 AM    [facility], [place], [place]  [address] Phone: [phone]; Fax: [phone]

## 2017-12-10 NOTE — H&P (View-Only) (Signed)
Cardiology Office Note:    Date:  12/10/2017   ID:  [name], DOB 12/13/1960, MRN [id number]  PCP:  Osei-Bonsu, George, MD  Cardiologist:  Daton Szilagyi, MD   Referring MD: Osei-Bonsu, George, MD    Pt presents for f/u of valvular heart disease    History of Present Illness:    [name] is a 57 y.o. female with hx of CVA, HTN, ESRD on dialysis MWF, Lupus, dysfunctional uterine bleeding, PUD, aortic stenosis and mitral stenosis, anemia.  She was admitted with resp distres, volume overload 6/3-6/7 which was managed with hemodialysis.She was in rapid atrial fib    Her blood pressure was elevated upon presentation.  Did admit to missing some of her meds prior to admit Her Lisinopril was DC'd 2/2 cough.  Her Labetalol dose was increased.    She was seen by S Weaver in follow up in June  Doing OK   Some SOB prior to dialysis.   Echo was ordered  This was done on November 05, 2017 LVEF wsa normal   THe aortic valve was mild to moderately narrowed  Mean gradient was 20 mm Hg Gradients across MV were 25 and 17 mm Hg   This was increased from previous echo   Valve appears to be severely narrowed    Since seen the pt says her breathing is fair   She deneis palpitations    She did not have spell like in hospital this spring   No palpitations    Prior CV studies:   The following studies were reviewed today:  Abd CTA 05/15/17 IMPRESSION: 1. Thickening of the walls of the majority of the colon, particularly prominent within the left colon, raising the possibility of a diffuse colitis of infectious or inflammatory nature. There is, however, no pericolonic fluid seen to confirm an acute colitis. Alternatively, this thickening could be chronic related to the underlying diverticulosis. Of note, there are no focal inflammatory changes to suggest acute diverticulitis at this time. No bowel obstruction. 2. Aortic atherosclerosis. 3. Additional chronic/incidental findings detailed above.  Echo  07/24/16 Mod conc LVH, EF 60-65, no RWMA, Gr 2 DDd, bicuspid aortic valve, mild to mod AS (mean 18, peak 38), MAC, mod mitral stenosis (mean 9, peak 19), mild to mod MR, severe LAE, normal RVSF, mild RAE, mild TR  Echo 05/12/15 EF 55-60, normal wall motion, grade 2 diastolic dysfunction, mild AS (mean 14, peak 29), moderate to severe MAC, mild mitral stenosis (mean 5, peak 9), severe LAE  Myoview 4/16 EF 59, no ischemia  Echo 3/16 Mod LVH, EF 55-60, no RWMA, Gr 1 DD, mod MS (mean 9), mod LAE, mild RAE, mod to severe TR, PASP 65, trivial pericardial eff  Past Medical History:  Diagnosis Date  . Anemia   . Aortic stenosis 09/25/2016   Echo 07/24/16: Mod conc LVH, EF 60-65, no RWMA, Gr 2 DDd, bicuspid aortic valve, mild to mod AS (mean 18, peak 38), MAC, mod mitral stenosis (mean 9, peak 19), mild to mod MR, severe LAE, normal RVSF, mild RAE, mild TR  . Arthritis    "joints" (10/23/2017)  . Blood transfusion '08   [facility]; "low HgB" (10/23/2017)  . Chronic diastolic CHF (congestive heart failure) (HCC)   . Dysfunctional uterine bleeding 12/19/2010  . ESRD (end stage renal disease) on dialysis (HCC)    "MWF; [facility] (10/23/2017)  . GERD (gastroesophageal reflux disease)   . Headache   . Heart murmur   . Hemodialysis patient (HCC)      right extremity port  . History of hiatal hernia   . Hx of cardiovascular stress test    Lexiscan Myoview 4/16:  Normal stress nuclear study, EF 59%  . Hypertension   . Lupus (HCC)    "? kind" (10/23/2017)  . Mitral stenosis    Echo 4/16:  EF 55-60%, no RWMA, Gr 1 DD, mod MS (mean 9 mmHg), mod LAE, mild RAE, PASP 65, mod to severe TR, trivial eff // Echo 6/19:  Mild LVH, EF 55-60, no RWMA, Gr 2 DD, mild to mod AS (Mean 20), severe MS (mean 17), massive LAE, PASP 48, trivial effusion   . Peptic ulcer disease   . Pneumonia 10/21/2017  . Stroke (HCC)    per patient "they said i had a small stroke but i couldnt even tell"   Surgical Hx: The patient  has a past  surgical history that includes Dialysis fistula creation (2007); Esophagogastroduodenoscopy (N/A, 07/31/2014); Endometrial ablation; Shoulder open rotator cuff repair (Right, 10/09/2016); Colonoscopy w/ biopsies and polypectomy; Revison of arteriovenous fistula (Right, 09/26/2017); and Insertion of arteriovenous (av) artegraft arm (Right, 09/26/2017).   Current Medications: Current Meds  Medication Sig  . acetaminophen (TYLENOL) 500 MG tablet Take 1,000 mg by mouth as needed for mild pain.  . amLODipine (NORVASC) 10 MG tablet Take 10 mg by mouth at bedtime.   . aspirin EC 81 MG tablet Take 81 mg by mouth at bedtime.  . calcitRIOL (ROCALTROL) 0.25 MCG capsule Take 7 capsules (1.75 mcg total) by mouth every other day.  . cloNIDine (CATAPRES) 0.2 MG tablet TAKE 1 TABLET(0.2 MG) BY MOUTH TWICE DAILY  . HYDROcodone-acetaminophen (NORCO/VICODIN) 5-325 MG tablet Take 1 tablet by mouth every 6 (six) hours as needed for severe pain.  . labetalol (NORMODYNE) 300 MG tablet Take 2 tablets (600 mg total) by mouth 3 (three) times daily.  . lanthanum (FOSRENOL) 1000 MG chewable tablet Chew 2 tablets (2,000 mg total) by mouth 3 (three) times daily with meals.  . multivitamin (RENA-VIT) TABS tablet Take 1 tablet by mouth daily.  . ondansetron (ZOFRAN) 4 MG tablet Take 4 mg by mouth every 6 (six) hours as needed for nausea or vomiting.  . pantoprazole (PROTONIX) 40 MG tablet Take 40 mg by mouth daily.     Allergies:   Patient has no known allergies.   Social History   Tobacco Use  . Smoking status: Former Smoker    Packs/day: 0.10    Years: 10.00    Pack years: 1.00    Types: Cigarettes    Start date: 10/21/2017  . Smokeless tobacco: Never Used  Substance Use Topics  . Alcohol use: Yes    Alcohol/week: 0.6 oz    Types: 1 Cans of beer per week  . Drug use: Yes    Types: Marijuana, Cocaine    Comment: 10/23/2017 "marijurana qod or so; occasionally I''ll snort cocaine"     Family Hx: The patient's family  history includes Hypertension in her father and mother; Liver cancer in her maternal grandmother; Lymphoma in her maternal aunt; Renal Disease in her father. There is no history of Heart attack.  ROS:   Please see the history of present illness.    Review of Systems  Cardiovascular: Positive for palpitations.  Respiratory: Positive for cough, shortness of breath and snoring.   Gastrointestinal: Positive for constipation and nausea.   All other systems reviewed and are negative.   EKGs/Labs/Other Test Reviewed:    EKG:  EKG is  ordered today.    The ekg ordered today demonstrates normal sinus rhythm, heart rate 79, normal axis, LVH, QTC 474, similar to prior tracing dated 09/25/2016  Recent Labs: 01/26/2017: B Natriuretic Peptide >4,500.0 10/14/2017: ALT 18 10/24/2017: Magnesium 1.9 10/25/2017: BUN 33; Creatinine, Ser 6.93; Hemoglobin 9.0; Platelets 161; Potassium 4.4; Sodium 133   Recent Lipid Panel Lab Results  Component Value Date/Time   CHOL 187 02/23/2016 10:40 AM   TRIG 89 02/23/2016 10:40 AM   HDL 55 02/23/2016 10:40 AM   CHOLHDL 3.4 02/23/2016 10:40 AM   LDLCALC 114 02/23/2016 10:40 AM    Physical Exam:    VS:  BP (!) 142/80 (BP Location: Left Arm, Patient Position: Sitting, Cuff Size: Normal)   Pulse 63   Ht 5' 6" (1.676 m)   Wt 131 lb 1.9 oz (59.5 kg)   LMP 12/05/2010 (LMP Unknown)   SpO2 99%   BMI 21.16 kg/m     Wt Readings from Last 3 Encounters:  12/10/17 131 lb 1.9 oz (59.5 kg)  10/29/17 131 lb 6.4 oz (59.6 kg)  10/25/17 131 lb 13.4 oz (59.8 kg)    PE   PT is in NAD  HEENT:   NCAT Neck   JVP is normal   No bruits Lungs are rel clear   Cardiac exam:   RRR Normal S1, S2  No S3   Gr II/VI systlioic murmurs   Abd   Soft   nontender Ext show trace edema     ASSESSMENT & PLAN:     1  Valvular heart disease   I have reviewed echo with pt   AV is not critically narrowed   MV gradients are higher, sugg worsenng MS   (severe) The pt was admitted earlier this spring  with HTN and volume overload   Admits to missing some meds     Volume status is not bad  Now.     Recomm she be set up for TEE to evaluate valves further    Hypertensive heart disease with chronic diastolic congestive heart failure (HCC) Keep on same meds Volume status is OK   BP is OK Aortic valve stenosis, Evaluate with TEE  Mitral valve stenosis, Will set up for TEE  ESRD needing dialysis (HCC) She remains on Monday, Wednesday, Friday dialysis.  Aortic atherosclerosis (HCC)   Continue aspirin.  Dispo:  No follow-ups on file.   Medication Adjustments/Labs and Tests Ordered: Current medicines are reviewed at length with the patient today.  Concerns regarding medicines are outlined above.  Tests Ordered: No orders of the defined types were placed in this encounter.  Medication Changes: No orders of the defined types were placed in this encounter.   Signed, Eleena Grater, MD  12/10/2017 10:39 AM    [facility] [address], [place], [place]  [address] Phone: [phone]; Fax: [phone]    

## 2017-12-10 NOTE — Patient Instructions (Addendum)
Your physician recommends that you continue on your current medications as directed. Please refer to the Current Medication list given to you today.  Your physician has requested that you have a TEE. During a TEE, sound waves are used to create images of your heart. It provides your doctor with information about the size and shape of your heart and how well your heart's chambers and valves are working. In this test, a transducer is attached to the end of a flexible tube that's guided down your throat and into your esophagus (the tube leading from you mouth to your stomach) to get a more detailed image of your heart. You are not awake for the procedure. Please see the instruction sheet given to you today. For further information please visit [email or web address].   Addendum: faxed script to [facility] [phone] for lipids to be drawn with results to be faxed to Dr. [place] Challenger.

## 2017-12-11 DIAGNOSIS — M3210 Systemic lupus erythematosus, organ or system involvement unspecified: Secondary | ICD-10-CM | POA: Diagnosis not present

## 2017-12-11 DIAGNOSIS — D509 Iron deficiency anemia, unspecified: Secondary | ICD-10-CM | POA: Diagnosis not present

## 2017-12-11 DIAGNOSIS — N2581 Secondary hyperparathyroidism of renal origin: Secondary | ICD-10-CM | POA: Diagnosis not present

## 2017-12-11 DIAGNOSIS — N186 End stage renal disease: Secondary | ICD-10-CM | POA: Diagnosis not present

## 2017-12-11 DIAGNOSIS — D631 Anemia in chronic kidney disease: Secondary | ICD-10-CM | POA: Diagnosis not present

## 2017-12-13 DIAGNOSIS — M3210 Systemic lupus erythematosus, organ or system involvement unspecified: Secondary | ICD-10-CM | POA: Diagnosis not present

## 2017-12-13 DIAGNOSIS — D509 Iron deficiency anemia, unspecified: Secondary | ICD-10-CM | POA: Diagnosis not present

## 2017-12-13 DIAGNOSIS — N186 End stage renal disease: Secondary | ICD-10-CM | POA: Diagnosis not present

## 2017-12-13 DIAGNOSIS — N2581 Secondary hyperparathyroidism of renal origin: Secondary | ICD-10-CM | POA: Diagnosis not present

## 2017-12-13 DIAGNOSIS — D631 Anemia in chronic kidney disease: Secondary | ICD-10-CM | POA: Diagnosis not present

## 2017-12-16 DIAGNOSIS — N2581 Secondary hyperparathyroidism of renal origin: Secondary | ICD-10-CM | POA: Diagnosis not present

## 2017-12-16 DIAGNOSIS — D631 Anemia in chronic kidney disease: Secondary | ICD-10-CM | POA: Diagnosis not present

## 2017-12-16 DIAGNOSIS — N186 End stage renal disease: Secondary | ICD-10-CM | POA: Diagnosis not present

## 2017-12-16 DIAGNOSIS — D509 Iron deficiency anemia, unspecified: Secondary | ICD-10-CM | POA: Diagnosis not present

## 2017-12-16 DIAGNOSIS — M3210 Systemic lupus erythematosus, organ or system involvement unspecified: Secondary | ICD-10-CM | POA: Diagnosis not present

## 2017-12-18 DIAGNOSIS — D631 Anemia in chronic kidney disease: Secondary | ICD-10-CM | POA: Diagnosis not present

## 2017-12-18 DIAGNOSIS — D509 Iron deficiency anemia, unspecified: Secondary | ICD-10-CM | POA: Diagnosis not present

## 2017-12-18 DIAGNOSIS — N186 End stage renal disease: Secondary | ICD-10-CM | POA: Diagnosis not present

## 2017-12-18 DIAGNOSIS — M3210 Systemic lupus erythematosus, organ or system involvement unspecified: Secondary | ICD-10-CM | POA: Diagnosis not present

## 2017-12-18 DIAGNOSIS — N2581 Secondary hyperparathyroidism of renal origin: Secondary | ICD-10-CM | POA: Diagnosis not present

## 2017-12-19 DIAGNOSIS — N186 End stage renal disease: Secondary | ICD-10-CM | POA: Diagnosis not present

## 2017-12-19 DIAGNOSIS — I120 Hypertensive chronic kidney disease with stage 5 chronic kidney disease or end stage renal disease: Secondary | ICD-10-CM | POA: Diagnosis not present

## 2017-12-19 DIAGNOSIS — Z992 Dependence on renal dialysis: Secondary | ICD-10-CM | POA: Diagnosis not present

## 2017-12-20 DIAGNOSIS — M3210 Systemic lupus erythematosus, organ or system involvement unspecified: Secondary | ICD-10-CM | POA: Diagnosis not present

## 2017-12-20 DIAGNOSIS — N186 End stage renal disease: Secondary | ICD-10-CM | POA: Diagnosis not present

## 2017-12-20 DIAGNOSIS — D631 Anemia in chronic kidney disease: Secondary | ICD-10-CM | POA: Diagnosis not present

## 2017-12-20 DIAGNOSIS — N2581 Secondary hyperparathyroidism of renal origin: Secondary | ICD-10-CM | POA: Diagnosis not present

## 2017-12-20 DIAGNOSIS — D509 Iron deficiency anemia, unspecified: Secondary | ICD-10-CM | POA: Diagnosis not present

## 2017-12-23 DIAGNOSIS — D631 Anemia in chronic kidney disease: Secondary | ICD-10-CM | POA: Diagnosis not present

## 2017-12-23 DIAGNOSIS — N186 End stage renal disease: Secondary | ICD-10-CM | POA: Diagnosis not present

## 2017-12-23 DIAGNOSIS — N2581 Secondary hyperparathyroidism of renal origin: Secondary | ICD-10-CM | POA: Diagnosis not present

## 2017-12-23 DIAGNOSIS — D509 Iron deficiency anemia, unspecified: Secondary | ICD-10-CM | POA: Diagnosis not present

## 2017-12-23 DIAGNOSIS — M3210 Systemic lupus erythematosus, organ or system involvement unspecified: Secondary | ICD-10-CM | POA: Diagnosis not present

## 2017-12-25 DIAGNOSIS — N186 End stage renal disease: Secondary | ICD-10-CM | POA: Diagnosis not present

## 2017-12-25 DIAGNOSIS — M3210 Systemic lupus erythematosus, organ or system involvement unspecified: Secondary | ICD-10-CM | POA: Diagnosis not present

## 2017-12-25 DIAGNOSIS — N2581 Secondary hyperparathyroidism of renal origin: Secondary | ICD-10-CM | POA: Diagnosis not present

## 2017-12-25 DIAGNOSIS — D631 Anemia in chronic kidney disease: Secondary | ICD-10-CM | POA: Diagnosis not present

## 2017-12-25 DIAGNOSIS — D509 Iron deficiency anemia, unspecified: Secondary | ICD-10-CM | POA: Diagnosis not present

## 2017-12-26 ENCOUNTER — Encounter: Payer: Self-pay | Admitting: *Deleted

## 2017-12-26 ENCOUNTER — Telehealth: Payer: Self-pay | Admitting: *Deleted

## 2017-12-26 NOTE — Telephone Encounter (Signed)
Spoke with patient re: arranging TEE per Dr. [place]rington Challenger. TEE has been arranged for 01/02/18 at 10:00 am. Instruction letter printed and placed in mail to patient. I reviewed all information with her over the phone as well.  Faxed prescription to her dialysis center [phone] (verified number) for CBC, BMET to be drawn at her next treatment. Pt is aware of this plan.

## 2017-12-27 DIAGNOSIS — N186 End stage renal disease: Secondary | ICD-10-CM | POA: Diagnosis not present

## 2017-12-27 DIAGNOSIS — N2581 Secondary hyperparathyroidism of renal origin: Secondary | ICD-10-CM | POA: Diagnosis not present

## 2017-12-27 DIAGNOSIS — D631 Anemia in chronic kidney disease: Secondary | ICD-10-CM | POA: Diagnosis not present

## 2017-12-27 DIAGNOSIS — D509 Iron deficiency anemia, unspecified: Secondary | ICD-10-CM | POA: Diagnosis not present

## 2017-12-27 DIAGNOSIS — M3210 Systemic lupus erythematosus, organ or system involvement unspecified: Secondary | ICD-10-CM | POA: Diagnosis not present

## 2017-12-30 DIAGNOSIS — D631 Anemia in chronic kidney disease: Secondary | ICD-10-CM | POA: Diagnosis not present

## 2017-12-30 DIAGNOSIS — N2581 Secondary hyperparathyroidism of renal origin: Secondary | ICD-10-CM | POA: Diagnosis not present

## 2017-12-30 DIAGNOSIS — M3210 Systemic lupus erythematosus, organ or system involvement unspecified: Secondary | ICD-10-CM | POA: Diagnosis not present

## 2017-12-30 DIAGNOSIS — D509 Iron deficiency anemia, unspecified: Secondary | ICD-10-CM | POA: Diagnosis not present

## 2017-12-30 DIAGNOSIS — N186 End stage renal disease: Secondary | ICD-10-CM | POA: Diagnosis not present

## 2018-01-01 DIAGNOSIS — D631 Anemia in chronic kidney disease: Secondary | ICD-10-CM | POA: Diagnosis not present

## 2018-01-01 DIAGNOSIS — M3210 Systemic lupus erythematosus, organ or system involvement unspecified: Secondary | ICD-10-CM | POA: Diagnosis not present

## 2018-01-01 DIAGNOSIS — N186 End stage renal disease: Secondary | ICD-10-CM | POA: Diagnosis not present

## 2018-01-01 DIAGNOSIS — D509 Iron deficiency anemia, unspecified: Secondary | ICD-10-CM | POA: Diagnosis not present

## 2018-01-01 DIAGNOSIS — N2581 Secondary hyperparathyroidism of renal origin: Secondary | ICD-10-CM | POA: Diagnosis not present

## 2018-01-02 ENCOUNTER — Other Ambulatory Visit (HOSPITAL_COMMUNITY): Payer: Self-pay | Admitting: *Deleted

## 2018-01-02 ENCOUNTER — Ambulatory Visit (HOSPITAL_BASED_OUTPATIENT_CLINIC_OR_DEPARTMENT_OTHER): Payer: Medicare Other

## 2018-01-02 ENCOUNTER — Other Ambulatory Visit: Payer: Self-pay

## 2018-01-02 ENCOUNTER — Ambulatory Visit (HOSPITAL_COMMUNITY)
Admission: RE | Admit: 2018-01-02 | Discharge: 2018-01-02 | Disposition: A | Discharge status: Home / Self Care | Payer: Medicare Other | Source: Ambulatory Visit | Attending: Cardiology | Admitting: Cardiology

## 2018-01-02 ENCOUNTER — Encounter (HOSPITAL_COMMUNITY): Payer: Self-pay

## 2018-01-02 ENCOUNTER — Encounter (HOSPITAL_COMMUNITY)
Admission: RE | Disposition: A | Discharge status: Home / Self Care | Payer: Self-pay | Source: Ambulatory Visit | Attending: Cardiology

## 2018-01-02 DIAGNOSIS — I5032 Chronic diastolic (congestive) heart failure: Secondary | ICD-10-CM | POA: Insufficient documentation

## 2018-01-02 DIAGNOSIS — Z7982 Long term (current) use of aspirin: Secondary | ICD-10-CM | POA: Insufficient documentation

## 2018-01-02 DIAGNOSIS — N186 End stage renal disease: Secondary | ICD-10-CM | POA: Diagnosis not present

## 2018-01-02 DIAGNOSIS — D631 Anemia in chronic kidney disease: Secondary | ICD-10-CM | POA: Diagnosis not present

## 2018-01-02 DIAGNOSIS — Z87891 Personal history of nicotine dependence: Secondary | ICD-10-CM | POA: Diagnosis not present

## 2018-01-02 DIAGNOSIS — Z992 Dependence on renal dialysis: Secondary | ICD-10-CM | POA: Insufficient documentation

## 2018-01-02 DIAGNOSIS — I340 Nonrheumatic mitral (valve) insufficiency: Secondary | ICD-10-CM

## 2018-01-02 DIAGNOSIS — I080 Rheumatic disorders of both mitral and aortic valves: Secondary | ICD-10-CM | POA: Diagnosis not present

## 2018-01-02 DIAGNOSIS — Z8673 Personal history of transient ischemic attack (TIA), and cerebral infarction without residual deficits: Secondary | ICD-10-CM | POA: Insufficient documentation

## 2018-01-02 DIAGNOSIS — I132 Hypertensive heart and chronic kidney disease with heart failure and with stage 5 chronic kidney disease, or end stage renal disease: Secondary | ICD-10-CM | POA: Diagnosis not present

## 2018-01-02 DIAGNOSIS — K219 Gastro-esophageal reflux disease without esophagitis: Secondary | ICD-10-CM | POA: Insufficient documentation

## 2018-01-02 DIAGNOSIS — M329 Systemic lupus erythematosus, unspecified: Secondary | ICD-10-CM | POA: Insufficient documentation

## 2018-01-02 DIAGNOSIS — I700 Atherosclerosis of aorta: Secondary | ICD-10-CM | POA: Insufficient documentation

## 2018-01-02 HISTORY — PX: TEE WITHOUT CARDIOVERSION: SHX5443

## 2018-01-02 SURGERY — ECHOCARDIOGRAM, TRANSESOPHAGEAL
Anesthesia: Moderate Sedation

## 2018-01-02 MED ORDER — FENTANYL CITRATE (PF) 100 MCG/2ML IJ SOLN
INTRAMUSCULAR | Status: DC | PRN
  Administered 2018-01-02 (×2): 25 ug via INTRAVENOUS

## 2018-01-02 MED ORDER — MIDAZOLAM HCL 5 MG/ML IJ SOLN
INTRAMUSCULAR | Status: AC
  Filled 2018-01-02: qty 2

## 2018-01-02 MED ORDER — MIDAZOLAM HCL 10 MG/2ML IJ SOLN
INTRAMUSCULAR | Status: DC | PRN
  Administered 2018-01-02 (×2): 2 mg via INTRAVENOUS

## 2018-01-02 MED ORDER — SODIUM CHLORIDE BACTERIOSTATIC 0.9 % IJ SOLN
INTRAMUSCULAR | Status: DC | PRN
  Administered 2018-01-02: 9 mL via INTRAVENOUS

## 2018-01-02 MED ORDER — HYDRALAZINE HCL 20 MG/ML IJ SOLN
INTRAMUSCULAR | Status: DC | PRN
  Administered 2018-01-02 (×2): 10 mg via INTRAVENOUS

## 2018-01-02 MED ORDER — BUTAMBEN-TETRACAINE-BENZOCAINE 2-2-14 % EX AERO
INHALATION_SPRAY | CUTANEOUS | Status: DC | PRN
  Administered 2018-01-02: 2 via TOPICAL

## 2018-01-02 MED ORDER — METOPROLOL TARTRATE 5 MG/5ML IV SOLN
INTRAVENOUS | Status: DC | PRN
  Administered 2018-01-02: 5 mg via INTRAVENOUS

## 2018-01-02 MED ORDER — SODIUM CHLORIDE 0.9 % IV SOLN
INTRAVENOUS | Status: DC
  Administered 2018-01-02: 500 mL via INTRAVENOUS

## 2018-01-02 MED ORDER — METOPROLOL TARTRATE 5 MG/5ML IV SOLN
INTRAVENOUS | Status: AC
  Filled 2018-01-02: qty 5

## 2018-01-02 MED ORDER — SODIUM CHLORIDE 0.9 % IV SOLN: INTRAVENOUS | Status: DC

## 2018-01-02 MED ORDER — FENTANYL CITRATE (PF) 100 MCG/2ML IJ SOLN
INTRAMUSCULAR | Status: AC
  Filled 2018-01-02: qty 2

## 2018-01-02 MED ORDER — HYDRALAZINE HCL 20 MG/ML IJ SOLN
INTRAMUSCULAR | Status: AC
  Filled 2018-01-02: qty 1

## 2018-01-02 NOTE — Progress Notes (Signed)
  Echocardiogram Echocardiogram Transesophageal has been performed.  [name] 01/02/2018, 11:01 AM

## 2018-01-02 NOTE — CV Procedure (Signed)
Procedure: TEE  Indication: Mitral and aortic stenosis.   Sedation: Versed 4 mg IV, Fentanyl 50 mcg IV  Findings: Please see echo section for full report.  Normal LV size with mild LV hypertrophy, EF 55-60%.  Normal wall motion.  Normal RV size with mildly decreased systolic function.  Mild left atrial enlargement, no LA appendage thrombus.  Normal right atrium.  No ASD/PFO, negative bubble study.  Mild TR with peak RV-RA 41 mmHg.  Functionally bicuspid aortic valve with fused noncoronary and left coronary cusps.  Moderate aortic stenosis with mean gradient 26 mmHg. No AI.  The mitral valve was heavily calcified and restricted.  There was mild to moderate mitral regurgitation.  Mitral valve mean gradient 16 mmHg, PHT 78 msec, MVA 2.8 cm^2 by PHT.  By planimetry, MVA 1.5 cm^2. Looking at the valve, there appears to be at least moderate mitral stenosis.  I think that the PHT-derived value is inaccurate.  Normal caliber thoracic aorta with grade III plaque descending thoracic aorta.   Impression: Moderate aortic stenosis with functionally bicuspid valve.  There was at least moderate mitral stenosis by mean gradient and planimetry.  I think that the pressure half-time derived value for MVA was inaccurate. Mild to moderate MR.    [name] 01/02/2018 10:43 AM

## 2018-01-02 NOTE — Discharge Instructions (Signed)

## 2018-01-02 NOTE — Interval H&P Note (Signed)
History and Physical Interval Note:  01/02/2018 10:08 AM  [name]  has presented today for surgery, with the diagnosis of AORTIC VALVE  STENOSIS  The various methods of treatment have been discussed with the patient and family. After consideration of risks, benefits and other options for treatment, the patient has consented to  Procedure(s): TRANSESOPHAGEAL ECHOCARDIOGRAM (TEE) (N/A) as a surgical intervention .  The patient's history has been reviewed, patient examined, no change in status, stable for surgery.  I have reviewed the patient's chart and labs.  Questions were answered to the patient's satisfaction.     Darneisha Windhorst [employer]

## 2018-01-03 DIAGNOSIS — M3210 Systemic lupus erythematosus, organ or system involvement unspecified: Secondary | ICD-10-CM | POA: Diagnosis not present

## 2018-01-03 DIAGNOSIS — D631 Anemia in chronic kidney disease: Secondary | ICD-10-CM | POA: Diagnosis not present

## 2018-01-03 DIAGNOSIS — N186 End stage renal disease: Secondary | ICD-10-CM | POA: Diagnosis not present

## 2018-01-03 DIAGNOSIS — N2581 Secondary hyperparathyroidism of renal origin: Secondary | ICD-10-CM | POA: Diagnosis not present

## 2018-01-03 DIAGNOSIS — D509 Iron deficiency anemia, unspecified: Secondary | ICD-10-CM | POA: Diagnosis not present

## 2018-01-05 ENCOUNTER — Encounter (HOSPITAL_COMMUNITY): Payer: Self-pay | Admitting: Cardiology

## 2018-01-06 DIAGNOSIS — D509 Iron deficiency anemia, unspecified: Secondary | ICD-10-CM | POA: Diagnosis not present

## 2018-01-06 DIAGNOSIS — N2581 Secondary hyperparathyroidism of renal origin: Secondary | ICD-10-CM | POA: Diagnosis not present

## 2018-01-06 DIAGNOSIS — N186 End stage renal disease: Secondary | ICD-10-CM | POA: Diagnosis not present

## 2018-01-06 DIAGNOSIS — D631 Anemia in chronic kidney disease: Secondary | ICD-10-CM | POA: Diagnosis not present

## 2018-01-06 DIAGNOSIS — M3210 Systemic lupus erythematosus, organ or system involvement unspecified: Secondary | ICD-10-CM | POA: Diagnosis not present

## 2018-01-08 DIAGNOSIS — D631 Anemia in chronic kidney disease: Secondary | ICD-10-CM | POA: Diagnosis not present

## 2018-01-08 DIAGNOSIS — N2581 Secondary hyperparathyroidism of renal origin: Secondary | ICD-10-CM | POA: Diagnosis not present

## 2018-01-08 DIAGNOSIS — D509 Iron deficiency anemia, unspecified: Secondary | ICD-10-CM | POA: Diagnosis not present

## 2018-01-08 DIAGNOSIS — M3210 Systemic lupus erythematosus, organ or system involvement unspecified: Secondary | ICD-10-CM | POA: Diagnosis not present

## 2018-01-08 DIAGNOSIS — N186 End stage renal disease: Secondary | ICD-10-CM | POA: Diagnosis not present

## 2018-01-10 DIAGNOSIS — N186 End stage renal disease: Secondary | ICD-10-CM | POA: Diagnosis not present

## 2018-01-10 DIAGNOSIS — D631 Anemia in chronic kidney disease: Secondary | ICD-10-CM | POA: Diagnosis not present

## 2018-01-10 DIAGNOSIS — M3210 Systemic lupus erythematosus, organ or system involvement unspecified: Secondary | ICD-10-CM | POA: Diagnosis not present

## 2018-01-10 DIAGNOSIS — N2581 Secondary hyperparathyroidism of renal origin: Secondary | ICD-10-CM | POA: Diagnosis not present

## 2018-01-10 DIAGNOSIS — D509 Iron deficiency anemia, unspecified: Secondary | ICD-10-CM | POA: Diagnosis not present

## 2018-01-13 DIAGNOSIS — D509 Iron deficiency anemia, unspecified: Secondary | ICD-10-CM | POA: Diagnosis not present

## 2018-01-13 DIAGNOSIS — M3210 Systemic lupus erythematosus, organ or system involvement unspecified: Secondary | ICD-10-CM | POA: Diagnosis not present

## 2018-01-13 DIAGNOSIS — N2581 Secondary hyperparathyroidism of renal origin: Secondary | ICD-10-CM | POA: Diagnosis not present

## 2018-01-13 DIAGNOSIS — N186 End stage renal disease: Secondary | ICD-10-CM | POA: Diagnosis not present

## 2018-01-13 DIAGNOSIS — D631 Anemia in chronic kidney disease: Secondary | ICD-10-CM | POA: Diagnosis not present

## 2018-01-15 DIAGNOSIS — D509 Iron deficiency anemia, unspecified: Secondary | ICD-10-CM | POA: Diagnosis not present

## 2018-01-15 DIAGNOSIS — M3210 Systemic lupus erythematosus, organ or system involvement unspecified: Secondary | ICD-10-CM | POA: Diagnosis not present

## 2018-01-15 DIAGNOSIS — N186 End stage renal disease: Secondary | ICD-10-CM | POA: Diagnosis not present

## 2018-01-15 DIAGNOSIS — N2581 Secondary hyperparathyroidism of renal origin: Secondary | ICD-10-CM | POA: Diagnosis not present

## 2018-01-15 DIAGNOSIS — D631 Anemia in chronic kidney disease: Secondary | ICD-10-CM | POA: Diagnosis not present

## 2018-01-17 DIAGNOSIS — N2581 Secondary hyperparathyroidism of renal origin: Secondary | ICD-10-CM | POA: Diagnosis not present

## 2018-01-17 DIAGNOSIS — M3210 Systemic lupus erythematosus, organ or system involvement unspecified: Secondary | ICD-10-CM | POA: Diagnosis not present

## 2018-01-17 DIAGNOSIS — D509 Iron deficiency anemia, unspecified: Secondary | ICD-10-CM | POA: Diagnosis not present

## 2018-01-17 DIAGNOSIS — N186 End stage renal disease: Secondary | ICD-10-CM | POA: Diagnosis not present

## 2018-01-17 DIAGNOSIS — D631 Anemia in chronic kidney disease: Secondary | ICD-10-CM | POA: Diagnosis not present

## 2018-01-19 DIAGNOSIS — Z992 Dependence on renal dialysis: Secondary | ICD-10-CM | POA: Diagnosis not present

## 2018-01-19 DIAGNOSIS — I120 Hypertensive chronic kidney disease with stage 5 chronic kidney disease or end stage renal disease: Secondary | ICD-10-CM | POA: Diagnosis not present

## 2018-01-19 DIAGNOSIS — N186 End stage renal disease: Secondary | ICD-10-CM | POA: Diagnosis not present

## 2018-01-20 DIAGNOSIS — N2581 Secondary hyperparathyroidism of renal origin: Secondary | ICD-10-CM | POA: Diagnosis not present

## 2018-01-20 DIAGNOSIS — Z23 Encounter for immunization: Secondary | ICD-10-CM | POA: Diagnosis not present

## 2018-01-20 DIAGNOSIS — M3210 Systemic lupus erythematosus, organ or system involvement unspecified: Secondary | ICD-10-CM | POA: Diagnosis not present

## 2018-01-20 DIAGNOSIS — D631 Anemia in chronic kidney disease: Secondary | ICD-10-CM | POA: Diagnosis not present

## 2018-01-20 DIAGNOSIS — N186 End stage renal disease: Secondary | ICD-10-CM | POA: Diagnosis not present

## 2018-01-20 DIAGNOSIS — D509 Iron deficiency anemia, unspecified: Secondary | ICD-10-CM | POA: Diagnosis not present

## 2018-01-22 DIAGNOSIS — Z23 Encounter for immunization: Secondary | ICD-10-CM | POA: Diagnosis not present

## 2018-01-22 DIAGNOSIS — D631 Anemia in chronic kidney disease: Secondary | ICD-10-CM | POA: Diagnosis not present

## 2018-01-22 DIAGNOSIS — M3210 Systemic lupus erythematosus, organ or system involvement unspecified: Secondary | ICD-10-CM | POA: Diagnosis not present

## 2018-01-22 DIAGNOSIS — D509 Iron deficiency anemia, unspecified: Secondary | ICD-10-CM | POA: Diagnosis not present

## 2018-01-22 DIAGNOSIS — N186 End stage renal disease: Secondary | ICD-10-CM | POA: Diagnosis not present

## 2018-01-22 DIAGNOSIS — N2581 Secondary hyperparathyroidism of renal origin: Secondary | ICD-10-CM | POA: Diagnosis not present

## 2018-01-24 DIAGNOSIS — D631 Anemia in chronic kidney disease: Secondary | ICD-10-CM | POA: Diagnosis not present

## 2018-01-24 DIAGNOSIS — D509 Iron deficiency anemia, unspecified: Secondary | ICD-10-CM | POA: Diagnosis not present

## 2018-01-24 DIAGNOSIS — M3210 Systemic lupus erythematosus, organ or system involvement unspecified: Secondary | ICD-10-CM | POA: Diagnosis not present

## 2018-01-24 DIAGNOSIS — N2581 Secondary hyperparathyroidism of renal origin: Secondary | ICD-10-CM | POA: Diagnosis not present

## 2018-01-24 DIAGNOSIS — N186 End stage renal disease: Secondary | ICD-10-CM | POA: Diagnosis not present

## 2018-01-24 DIAGNOSIS — Z23 Encounter for immunization: Secondary | ICD-10-CM | POA: Diagnosis not present

## 2018-01-27 ENCOUNTER — Telehealth: Payer: Self-pay | Admitting: Internal Medicine

## 2018-01-27 DIAGNOSIS — N2581 Secondary hyperparathyroidism of renal origin: Secondary | ICD-10-CM | POA: Diagnosis not present

## 2018-01-27 DIAGNOSIS — N186 End stage renal disease: Secondary | ICD-10-CM | POA: Diagnosis not present

## 2018-01-27 DIAGNOSIS — Z23 Encounter for immunization: Secondary | ICD-10-CM | POA: Diagnosis not present

## 2018-01-27 DIAGNOSIS — D509 Iron deficiency anemia, unspecified: Secondary | ICD-10-CM | POA: Diagnosis not present

## 2018-01-27 DIAGNOSIS — D631 Anemia in chronic kidney disease: Secondary | ICD-10-CM | POA: Diagnosis not present

## 2018-01-27 DIAGNOSIS — M3210 Systemic lupus erythematosus, organ or system involvement unspecified: Secondary | ICD-10-CM | POA: Diagnosis not present

## 2018-01-27 NOTE — Telephone Encounter (Signed)
Spoke to patient and informed her that her Echo and TEE would be further discussed at her next OV in October.  She verbalized understanding.

## 2018-01-27 NOTE — Telephone Encounter (Signed)
New Message   Patient is calling because she had an echocardiogram recently. She states that she was told that one of her heart valves was closing and in her words she was in bad shape. She would like to know the next steps. Please call.

## 2018-01-29 DIAGNOSIS — Z23 Encounter for immunization: Secondary | ICD-10-CM | POA: Diagnosis not present

## 2018-01-29 DIAGNOSIS — D509 Iron deficiency anemia, unspecified: Secondary | ICD-10-CM | POA: Diagnosis not present

## 2018-01-29 DIAGNOSIS — M3210 Systemic lupus erythematosus, organ or system involvement unspecified: Secondary | ICD-10-CM | POA: Diagnosis not present

## 2018-01-29 DIAGNOSIS — N186 End stage renal disease: Secondary | ICD-10-CM | POA: Diagnosis not present

## 2018-01-29 DIAGNOSIS — D631 Anemia in chronic kidney disease: Secondary | ICD-10-CM | POA: Diagnosis not present

## 2018-01-29 DIAGNOSIS — N2581 Secondary hyperparathyroidism of renal origin: Secondary | ICD-10-CM | POA: Diagnosis not present

## 2018-01-31 DIAGNOSIS — N186 End stage renal disease: Secondary | ICD-10-CM | POA: Diagnosis not present

## 2018-01-31 DIAGNOSIS — D631 Anemia in chronic kidney disease: Secondary | ICD-10-CM | POA: Diagnosis not present

## 2018-01-31 DIAGNOSIS — M3210 Systemic lupus erythematosus, organ or system involvement unspecified: Secondary | ICD-10-CM | POA: Diagnosis not present

## 2018-01-31 DIAGNOSIS — N2581 Secondary hyperparathyroidism of renal origin: Secondary | ICD-10-CM | POA: Diagnosis not present

## 2018-01-31 DIAGNOSIS — D509 Iron deficiency anemia, unspecified: Secondary | ICD-10-CM | POA: Diagnosis not present

## 2018-01-31 DIAGNOSIS — Z23 Encounter for immunization: Secondary | ICD-10-CM | POA: Diagnosis not present

## 2018-02-03 DIAGNOSIS — D509 Iron deficiency anemia, unspecified: Secondary | ICD-10-CM | POA: Diagnosis not present

## 2018-02-03 DIAGNOSIS — M3210 Systemic lupus erythematosus, organ or system involvement unspecified: Secondary | ICD-10-CM | POA: Diagnosis not present

## 2018-02-03 DIAGNOSIS — Z23 Encounter for immunization: Secondary | ICD-10-CM | POA: Diagnosis not present

## 2018-02-03 DIAGNOSIS — N186 End stage renal disease: Secondary | ICD-10-CM | POA: Diagnosis not present

## 2018-02-03 DIAGNOSIS — D631 Anemia in chronic kidney disease: Secondary | ICD-10-CM | POA: Diagnosis not present

## 2018-02-03 DIAGNOSIS — N2581 Secondary hyperparathyroidism of renal origin: Secondary | ICD-10-CM | POA: Diagnosis not present

## 2018-02-05 DIAGNOSIS — N2581 Secondary hyperparathyroidism of renal origin: Secondary | ICD-10-CM | POA: Diagnosis not present

## 2018-02-05 DIAGNOSIS — D631 Anemia in chronic kidney disease: Secondary | ICD-10-CM | POA: Diagnosis not present

## 2018-02-05 DIAGNOSIS — M3210 Systemic lupus erythematosus, organ or system involvement unspecified: Secondary | ICD-10-CM | POA: Diagnosis not present

## 2018-02-05 DIAGNOSIS — N186 End stage renal disease: Secondary | ICD-10-CM | POA: Diagnosis not present

## 2018-02-05 DIAGNOSIS — D509 Iron deficiency anemia, unspecified: Secondary | ICD-10-CM | POA: Diagnosis not present

## 2018-02-05 DIAGNOSIS — Z23 Encounter for immunization: Secondary | ICD-10-CM | POA: Diagnosis not present

## 2018-02-07 DIAGNOSIS — D509 Iron deficiency anemia, unspecified: Secondary | ICD-10-CM | POA: Diagnosis not present

## 2018-02-07 DIAGNOSIS — M3210 Systemic lupus erythematosus, organ or system involvement unspecified: Secondary | ICD-10-CM | POA: Diagnosis not present

## 2018-02-07 DIAGNOSIS — N2581 Secondary hyperparathyroidism of renal origin: Secondary | ICD-10-CM | POA: Diagnosis not present

## 2018-02-07 DIAGNOSIS — Z23 Encounter for immunization: Secondary | ICD-10-CM | POA: Diagnosis not present

## 2018-02-07 DIAGNOSIS — N186 End stage renal disease: Secondary | ICD-10-CM | POA: Diagnosis not present

## 2018-02-07 DIAGNOSIS — D631 Anemia in chronic kidney disease: Secondary | ICD-10-CM | POA: Diagnosis not present

## 2018-02-10 DIAGNOSIS — M3210 Systemic lupus erythematosus, organ or system involvement unspecified: Secondary | ICD-10-CM | POA: Diagnosis not present

## 2018-02-10 DIAGNOSIS — D631 Anemia in chronic kidney disease: Secondary | ICD-10-CM | POA: Diagnosis not present

## 2018-02-10 DIAGNOSIS — N2581 Secondary hyperparathyroidism of renal origin: Secondary | ICD-10-CM | POA: Diagnosis not present

## 2018-02-10 DIAGNOSIS — N186 End stage renal disease: Secondary | ICD-10-CM | POA: Diagnosis not present

## 2018-02-10 DIAGNOSIS — Z23 Encounter for immunization: Secondary | ICD-10-CM | POA: Diagnosis not present

## 2018-02-10 DIAGNOSIS — D509 Iron deficiency anemia, unspecified: Secondary | ICD-10-CM | POA: Diagnosis not present

## 2018-02-12 DIAGNOSIS — D509 Iron deficiency anemia, unspecified: Secondary | ICD-10-CM | POA: Diagnosis not present

## 2018-02-12 DIAGNOSIS — D631 Anemia in chronic kidney disease: Secondary | ICD-10-CM | POA: Diagnosis not present

## 2018-02-12 DIAGNOSIS — N2581 Secondary hyperparathyroidism of renal origin: Secondary | ICD-10-CM | POA: Diagnosis not present

## 2018-02-12 DIAGNOSIS — N186 End stage renal disease: Secondary | ICD-10-CM | POA: Diagnosis not present

## 2018-02-12 DIAGNOSIS — Z23 Encounter for immunization: Secondary | ICD-10-CM | POA: Diagnosis not present

## 2018-02-12 DIAGNOSIS — M3210 Systemic lupus erythematosus, organ or system involvement unspecified: Secondary | ICD-10-CM | POA: Diagnosis not present

## 2018-02-14 DIAGNOSIS — Z23 Encounter for immunization: Secondary | ICD-10-CM | POA: Diagnosis not present

## 2018-02-14 DIAGNOSIS — D631 Anemia in chronic kidney disease: Secondary | ICD-10-CM | POA: Diagnosis not present

## 2018-02-14 DIAGNOSIS — D509 Iron deficiency anemia, unspecified: Secondary | ICD-10-CM | POA: Diagnosis not present

## 2018-02-14 DIAGNOSIS — M3210 Systemic lupus erythematosus, organ or system involvement unspecified: Secondary | ICD-10-CM | POA: Diagnosis not present

## 2018-02-14 DIAGNOSIS — N186 End stage renal disease: Secondary | ICD-10-CM | POA: Diagnosis not present

## 2018-02-14 DIAGNOSIS — N2581 Secondary hyperparathyroidism of renal origin: Secondary | ICD-10-CM | POA: Diagnosis not present

## 2018-02-17 DIAGNOSIS — Z23 Encounter for immunization: Secondary | ICD-10-CM | POA: Diagnosis not present

## 2018-02-17 DIAGNOSIS — D631 Anemia in chronic kidney disease: Secondary | ICD-10-CM | POA: Diagnosis not present

## 2018-02-17 DIAGNOSIS — D509 Iron deficiency anemia, unspecified: Secondary | ICD-10-CM | POA: Diagnosis not present

## 2018-02-17 DIAGNOSIS — N2581 Secondary hyperparathyroidism of renal origin: Secondary | ICD-10-CM | POA: Diagnosis not present

## 2018-02-17 DIAGNOSIS — M3210 Systemic lupus erythematosus, organ or system involvement unspecified: Secondary | ICD-10-CM | POA: Diagnosis not present

## 2018-02-17 DIAGNOSIS — N186 End stage renal disease: Secondary | ICD-10-CM | POA: Diagnosis not present

## 2018-02-18 DIAGNOSIS — Z992 Dependence on renal dialysis: Secondary | ICD-10-CM | POA: Diagnosis not present

## 2018-02-18 DIAGNOSIS — I120 Hypertensive chronic kidney disease with stage 5 chronic kidney disease or end stage renal disease: Secondary | ICD-10-CM | POA: Diagnosis not present

## 2018-02-18 DIAGNOSIS — N186 End stage renal disease: Secondary | ICD-10-CM | POA: Diagnosis not present

## 2018-02-19 DIAGNOSIS — D631 Anemia in chronic kidney disease: Secondary | ICD-10-CM | POA: Diagnosis not present

## 2018-02-19 DIAGNOSIS — M3210 Systemic lupus erythematosus, organ or system involvement unspecified: Secondary | ICD-10-CM | POA: Diagnosis not present

## 2018-02-19 DIAGNOSIS — N186 End stage renal disease: Secondary | ICD-10-CM | POA: Diagnosis not present

## 2018-02-19 DIAGNOSIS — N2581 Secondary hyperparathyroidism of renal origin: Secondary | ICD-10-CM | POA: Diagnosis not present

## 2018-02-19 DIAGNOSIS — D509 Iron deficiency anemia, unspecified: Secondary | ICD-10-CM | POA: Diagnosis not present

## 2018-02-20 ENCOUNTER — Encounter: Payer: Self-pay | Admitting: *Deleted

## 2018-02-20 ENCOUNTER — Other Ambulatory Visit: Payer: Self-pay | Admitting: *Deleted

## 2018-02-20 ENCOUNTER — Ambulatory Visit (INDEPENDENT_AMBULATORY_CARE_PROVIDER_SITE_OTHER): Payer: Medicare Other | Admitting: Vascular Surgery

## 2018-02-20 ENCOUNTER — Encounter: Payer: Self-pay | Admitting: Internal Medicine

## 2018-02-20 ENCOUNTER — Telehealth: Payer: Self-pay | Admitting: *Deleted

## 2018-02-20 ENCOUNTER — Encounter: Payer: Self-pay | Admitting: Vascular Surgery

## 2018-02-20 ENCOUNTER — Ambulatory Visit (INDEPENDENT_AMBULATORY_CARE_PROVIDER_SITE_OTHER): Payer: Medicare Other | Admitting: Internal Medicine

## 2018-02-20 ENCOUNTER — Other Ambulatory Visit: Payer: Self-pay

## 2018-02-20 VITALS — BP 162/88 | HR 75 | Temp 97.2°F | Resp 18 | Ht 66.0 in | Wt 135.4 lb

## 2018-02-20 VITALS — BP 172/88 | HR 75 | Ht 66.0 in | Wt 134.6 lb

## 2018-02-20 DIAGNOSIS — N186 End stage renal disease: Secondary | ICD-10-CM

## 2018-02-20 DIAGNOSIS — I10 Essential (primary) hypertension: Secondary | ICD-10-CM

## 2018-02-20 DIAGNOSIS — Z992 Dependence on renal dialysis: Secondary | ICD-10-CM

## 2018-02-20 DIAGNOSIS — I350 Nonrheumatic aortic (valve) stenosis: Secondary | ICD-10-CM

## 2018-02-20 DIAGNOSIS — I340 Nonrheumatic mitral (valve) insufficiency: Secondary | ICD-10-CM

## 2018-02-20 NOTE — Patient Instructions (Signed)
Your physician recommends that you continue on your current medications as directed. Please refer to the Current Medication list given to you today.  Your physician has requested that you have an echocardiogram. Echocardiography is a painless test that uses sound waves to create images of your heart. It provides your doctor with information about the size and shape of your heart and how well your heart's chambers and valves are working. This procedure takes approximately one hour. There are no restrictions for this procedure.  Your physician recommends that return for follow up appointment with Dr. [place] Challenger in February.    We have both echo and visit with Dr. [place] Challenger scheduled same day.  See below.

## 2018-02-20 NOTE — Progress Notes (Signed)
Cardiology Office Note:    Date:  02/20/2018   ID:  [name], DOB [birth year], MRN [id number]  PCP:  Benito Mccreedy, MD  Cardiologist:  Dorris Carnes, MD   Referring MD: Benito Mccreedy, MD    Pt presents for f/u of valvular heart disease    History of Present Illness:    [name] is a 57 y.o. female with hx of CVA, HTN, ESRD on dialysis MWF, Lupus, dysfunctional uterine bleeding, PUD, aortic stenosis and mitral stenosis, anemia.  She was admitted with resp distres, volume overload 6/3-6/7 which was managed with hemodialysis.She was in rapid atrial fib    Her blood pressure was elevated upon presentation.  Did admit to missing some of her meds prior to admit Her Lisinopril was DC'd 2/2 cough.  Her Labetalol dose was increased.    She was seen by Kathleen Argue in follow up in June  Doing OK   Some SOB prior to dialysis.   Echo was ordered  This was done on November 05, 2017 LVEF wsa normal   THe aortic valve was mild to moderately narrowed  Mean gradient was 20 mm Hg Gradients across MV were 25 and 17 mm Hg   This was increased from previous echo   Valve appears to be severely narrowed    Since seen the pt says her breathing is fair   She deneis palpitations    She did not have spell like in hospital this spring   No palpitations   She denies PND Dialysis is doing OK    Prior CV studies:   The following studies were reviewed today:  Abd CTA 05/15/17 IMPRESSION: 1. Thickening of the walls of the majority of the colon, particularly prominent within the left colon, raising the possibility of a diffuse colitis of infectious or inflammatory nature. There is, however, no pericolonic fluid seen to confirm an acute colitis. Alternatively, this thickening could be chronic related to the underlying diverticulosis. Of note, there are no focal inflammatory changes to suggest acute diverticulitis at this time. No bowel obstruction. 2. Aortic atherosclerosis. 3. Additional chronic/incidental  findings detailed above.  Echo 07/24/16 Mod conc LVH, EF 60-65, no RWMA, Gr 2 DDd, bicuspid aortic valve, mild to mod AS (mean 18, peak 38), MAC, mod mitral stenosis (mean 9, peak 19), mild to mod MR, severe LAE, normal RVSF, mild RAE, mild TR  Echo 05/12/15 EF 55-60, normal wall motion, grade 2 diastolic dysfunction, mild AS (mean 14, peak 29), moderate to severe MAC, mild mitral stenosis (mean 5, peak 9), severe LAE  Myoview 4/16 EF 59, no ischemia  Echo 3/16 Mod LVH, EF 55-60, no RWMA, Gr 1 DD, mod MS (mean 9), mod LAE, mild RAE, mod to severe TR, PASP 65, trivial pericardial eff  Past Medical History:  Diagnosis Date  . Anemia   . Aortic stenosis 09/25/2016   Echo 07/24/16: Mod conc LVH, EF 60-65, no RWMA, Gr 2 DDd, bicuspid aortic valve, mild to mod AS (mean 18, peak 38), MAC, mod mitral stenosis (mean 9, peak 19), mild to mod MR, severe LAE, normal RVSF, mild RAE, mild TR  . Arthritis    "joints" (10/23/2017)  . Blood transfusion '08   [facility]; "low HgB" (10/23/2017)  . Chronic diastolic CHF (congestive heart failure) ([place])   . Dysfunctional uterine bleeding 12/19/2010  . ESRD (end stage renal disease) on dialysis [facility])    "MWF; [name]." (10/23/2017)  . GERD (gastroesophageal reflux disease)   . Headache   .  Heart murmur   . Hemodialysis patient [facility])    right extremity port  . History of hiatal hernia   . Hx of cardiovascular stress test    Lexiscan Myoview 4/16:  Normal stress nuclear study, EF 59%  . Hypertension   . Lupus ([place])    "? kind" (10/23/2017)  . Mitral stenosis    Echo 4/16:  EF 55-60%, no RWMA, Gr 1 DD, mod MS (mean 9 mmHg), mod LAE, mild RAE, PASP 65, mod to severe TR, trivial eff // Echo 6/19:  Mild LVH, EF 55-60, no RWMA, Gr 2 DD, mild to mod AS (Mean 20), severe MS (mean 17), massive LAE, PASP 48, trivial effusion   . Peptic ulcer disease   . Pneumonia 10/21/2017  . Stroke [facility])    per patient "they said i had a small stroke but i couldnt even tell"   Surgical  Hx: The patient  has a past surgical history that includes Dialysis fistula creation (2007); Esophagogastroduodenoscopy (N/A, 07/31/2014); Endometrial ablation; Shoulder open rotator cuff repair (Right, 10/09/2016); Colonoscopy w/ biopsies and polypectomy; Revison of arteriovenous fistula (Right, 09/26/2017); Insertion of arteriovenous (av) artegraft arm (Right, 09/26/2017); and TEE without cardioversion (N/A, 01/02/2018).   Current Medications: Current Meds  Medication Sig  . acetaminophen (TYLENOL) 500 MG tablet Take 1,000 mg by mouth as needed for mild pain.  [name] amLODipine (NORVASC) 10 MG tablet Take 10 mg by mouth at bedtime.   [name] aspirin EC 81 MG tablet Take 81 mg by mouth at bedtime.  . calcitRIOL (ROCALTROL) 0.25 MCG capsule Take 7 capsules (1.75 mcg total) by mouth every other day.  . cloNIDine (CATAPRES) 0.2 MG tablet TAKE 1 TABLET(0.2 MG) BY MOUTH TWICE DAILY (Patient taking differently: Take 0.2 mg by mouth 2 (two) times daily. )  . HYDROcodone-acetaminophen (NORCO/VICODIN) 5-325 MG tablet Take 1 tablet by mouth every 6 (six) hours as needed for severe pain.  [name] labetalol (NORMODYNE) 300 MG tablet Take 2 tablets (600 mg total) by mouth 3 (three) times daily.  [name] lanthanum (FOSRENOL) 1000 MG chewable tablet Chew 2 tablets (2,000 mg total) by mouth 3 (three) times daily with meals. (Patient taking differently: Chew 1,000-2,000 mg by mouth 3 (three) times daily with meals. )  . multivitamin (RENA-VIT) TABS tablet Take 1 tablet by mouth daily.  . ondansetron (ZOFRAN) 4 MG tablet Take 4 mg by mouth every 6 (six) hours as needed for nausea or vomiting.  . pantoprazole (PROTONIX) 40 MG tablet Take 40 mg by mouth 2 (two) times daily.      Allergies:   Patient has no known allergies.   Social History   Tobacco Use  . Smoking status: Light Tobacco Smoker    Packs/day: 0.10    Years: 10.00    Pack years: 1.00    Types: Cigarettes    Start date: 10/21/2017  . Smokeless tobacco: Never Used  Substance  Use Topics  . Alcohol use: Yes    Alcohol/week: 1.0 standard drinks    Types: 1 Cans of beer per week  . Drug use: Yes    Types: Marijuana, Cocaine    Comment: 10/23/2017 "marijurana qod or so; occasionally I''ll snort cocaine"     Family Hx: The patient's family history includes Hypertension in her father and mother; Liver cancer in her maternal grandmother; Lymphoma in her maternal aunt; Renal Disease in her father. There is no history of Heart attack.  ROS:   Please see the history of present illness.  Review of Systems  Cardiovascular: Positive for palpitations.  Respiratory: Positive for cough, shortness of breath and snoring.   Gastrointestinal: Positive for constipation and nausea.   All other systems reviewed and are negative.   EKGs/Labs/Other Test Reviewed:    EKG:  EKG is not  ordered today.   Recent Labs: 10/14/2017: ALT 18 10/24/2017: Magnesium 1.9 10/25/2017: BUN 33; Creatinine, Ser 6.93; Hemoglobin 9.0; Platelets 161; Potassium 4.4; Sodium 133   Recent Lipid Panel Lab Results  Component Value Date/Time   CHOL 187 02/23/2016 10:40 AM   TRIG 89 02/23/2016 10:40 AM   HDL 55 02/23/2016 10:40 AM   CHOLHDL 3.4 02/23/2016 10:40 AM   LDLCALC 114 02/23/2016 10:40 AM    Physical Exam:    VS:  BP (!) 172/88   Pulse 75   Ht _0  (1.676 m)   Wt 134 lb 9.6 oz (61.1 kg)   LMP 12/05/2010 (LMP Unknown)   SpO2 99%   BMI 21.73 kg/m     Wt Readings from Last 3 Encounters:  02/20/18 134 lb 9.6 oz (61.1 kg)  01/02/18 125 lb (56.7 kg)  12/10/17 131 lb 1.9 oz (59.5 kg)    PE   PT is in NAD  HEENT:   NCAT Neck   JVP is normal   No bruits Lungs are rel clear   Cardiac exam:   RRR Normal S1, S2  No S3   Gr II/VI systlioic murmurs   Abd   Soft   nontender Ext show trace edema     ASSESSMENT & PLAN:     1  Valvular heart disease  TEE done earlier this summer    No severe valvular dz Cliincally, pt is doing fairly well considering she is on dialysis.    I would  continue to follow clinically WOuld recomm repeat TTE in early spring to reeval MV and AV  Hypertensive heart disease with chronic diastolic congestive heart failure ([place]) BP is up but she did not take meds yet today  Volume status is good   Continue with dialysis Follow with renal   No changes for now    ESRD needing dialysis [facility]) She remains on Monday, Wednesday, Friday dialysis.  Aortic atherosclerosis (HCC)   Continue aspirin.  Need to check lipids       Medication Adjustments/Labs and Tests Ordered: Current medicines are reviewed at length with the patient today.  Concerns regarding medicines are outlined above.  Tests Ordered: No orders of the defined types were placed in this encounter.  Medication Changes: No orders of the defined types were placed in this encounter.   Signed, Dorris Carnes, MD  02/20/2018 9:37 AM    [address] [place], [place]  [address] Phone: [phone]; Fax: [phone]

## 2018-02-20 NOTE — Progress Notes (Signed)
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  She previously had the proximal segment replaced with an artery graft by Dr. Donzetta Matters in the past.  She has had some intermittent bleeding episodes but has not developed ulceration over the fistula.  Her current dialysis schedule is Monday Wednesday Friday.  Chronic medical problems include aortic stenosis hypertension both of which are currently stable.  Past Medical History:  Diagnosis Date  . Anemia   . Aortic stenosis 09/25/2016   Echo 07/24/16: Mod conc LVH, EF 60-65, no RWMA, Gr 2 DDd, bicuspid aortic valve, mild to mod AS (mean 18, peak 38), MAC, mod mitral stenosis (mean 9, peak 19), mild to mod MR, severe LAE, normal RVSF, mild RAE, mild TR  . Arthritis    "joints" (10/23/2017)  . Blood transfusion '08   [facility]; "low HgB" (10/23/2017)  . Chronic diastolic CHF (congestive heart failure) ([place])   . Dysfunctional uterine bleeding 12/19/2010  . ESRD (end stage renal disease) on dialysis [facility])    "MWF; [name]." (10/23/2017)  . GERD (gastroesophageal reflux disease)   . Headache   . Heart murmur   . Hemodialysis patient [facility])    right extremity port  . History of hiatal hernia   . Hx of cardiovascular stress test    Lexiscan Myoview 4/16:  Normal stress nuclear study, EF 59%  . Hypertension   . Lupus ([place])    "? kind" (10/23/2017)  . Mitral stenosis    Echo 4/16:  EF 55-60%, no RWMA, Gr 1 DD, mod MS (mean 9 mmHg), mod LAE, mild RAE, PASP 65, mod to severe TR, trivial eff // Echo 6/19:  Mild LVH, EF 55-60, no RWMA, Gr 2 DD, mild to mod AS (Mean 20), severe MS (mean 17), massive LAE, PASP 48, trivial effusion   . Peptic ulcer disease   . Pneumonia 10/21/2017  . Stroke [facility])    per patient "they said i had a small stroke but i couldnt even tell"   Past Surgical History:  Procedure Laterality Date  . COLONOSCOPY W/ BIOPSIES AND POLYPECTOMY    . DIALYSIS FISTULA CREATION  2007  . ENDOMETRIAL ABLATION     . ESOPHAGOGASTRODUODENOSCOPY N/A 07/31/2014   Procedure: ESOPHAGOGASTRODUODENOSCOPY (EGD);  Surgeon: Clarene Essex, MD;  Location: [facility] ENDOSCOPY;  Service: Endoscopy;  Laterality: N/A;  . INSERTION OF ARTERIOVENOUS (AV) ARTEGRAFT ARM Right 09/26/2017   Procedure: INSERTION OF ARTERIOVENOUS (AV) ARTEGRAFT INTO RIGHT ARM;  Surgeon: Waynetta Sandy, MD;  Location: [place];  Service: Vascular;  Laterality: Right;  . REVISON OF ARTERIOVENOUS FISTULA Right 09/26/2017   Procedure: REVISION OF ARTERIOVENOUS FISTULA RIGHT ARM WITH ARTEGRAFT;  Surgeon: Waynetta Sandy, MD;  Location: [place];  Service: Vascular;  Laterality: Right;  . SHOULDER OPEN ROTATOR CUFF REPAIR Right 10/09/2016   Procedure: ROTATOR CUFF REPAIR SHOULDER OPEN partial acrominectomy and extensive synovectomy;  Surgeon: Latanya [name], MD;  Location: [facility];  Service: Orthopedics;  Laterality: Right;  RNFA  . TEE WITHOUT CARDIOVERSION N/A 01/02/2018   Procedure: TRANSESOPHAGEAL ECHOCARDIOGRAM (TEE) Bubble Study;  Surgeon: Larey Dresser, MD;  Location[facility] ENDOSCOPY;  Service: Cardiovascular;  Laterality: N/A;    Review of systems: She denies fever chills.  She denies shortness of breath.  She denies chest pain.  Social History   Socioeconomic History  . Marital status: Single    Spouse name: Not on file  . Number of children: Not on file  . Years of  education: Not on file  . Highest education level: Not on file  Occupational History  . Not on file  Social Needs  . Financial resource strain: Not on file  . Food insecurity:    Worry: Not on file    Inability: Not on file  . Transportation needs:    Medical: Not on file    Non-medical: Not on file  Tobacco Use  . Smoking status: Light Tobacco Smoker    Packs/day: 0.10    Years: 10.00    Pack years: 1.00    Types: Cigarettes    Start date: 10/21/2017  . Smokeless tobacco: Never Used  Substance and Sexual Activity  . Alcohol use: Yes    Alcohol/week: 1.0 standard  drinks    Types: 1 Cans of beer per week  . Drug use: Yes    Types: Marijuana, Cocaine    Comment: 10/23/2017 "marijurana qod or so; occasionally I''ll snort cocaine"  . Sexual activity: Not Currently  Lifestyle  . Physical activity:    Days per week: Not on file    Minutes per session: Not on file  . Stress: Not on file  Relationships  . Social connections:    Talks on phone: Not on file    Gets together: Not on file    Attends religious service: Not on file    Active member of club or organization: Not on file    Attends meetings of clubs or organizations: Not on file    Relationship status: Not on file  . Intimate partner violence:    Fear of current or ex partner: Not on file    Emotionally abused: Not on file    Physically abused: Not on file    Forced sexual activity: Not on file  Other Topics Concern  . Not on file  Social History Narrative  . 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 Donzetta Matters early next week.  Risk benefits possible complications of procedure details including but not limited to bleeding infection thrombosis of the fistula were explained to the patient today.  She understands and she agrees to proceed.  Ruta Hinds, MD [facility]  [place] Office: [phone] Pager: [phone]

## 2018-02-20 NOTE — Telephone Encounter (Signed)
Surgery scheduled for Tuesday 02-25-18 in room 12 With C-ARM.

## 2018-02-21 ENCOUNTER — Ambulatory Visit: Payer: Medicare Other | Admitting: Vascular Surgery

## 2018-02-21 DIAGNOSIS — D509 Iron deficiency anemia, unspecified: Secondary | ICD-10-CM | POA: Diagnosis not present

## 2018-02-21 DIAGNOSIS — M3210 Systemic lupus erythematosus, organ or system involvement unspecified: Secondary | ICD-10-CM | POA: Diagnosis not present

## 2018-02-21 DIAGNOSIS — D631 Anemia in chronic kidney disease: Secondary | ICD-10-CM | POA: Diagnosis not present

## 2018-02-21 DIAGNOSIS — N2581 Secondary hyperparathyroidism of renal origin: Secondary | ICD-10-CM | POA: Diagnosis not present

## 2018-02-21 DIAGNOSIS — N186 End stage renal disease: Secondary | ICD-10-CM | POA: Diagnosis not present

## 2018-02-24 ENCOUNTER — Other Ambulatory Visit: Payer: Self-pay

## 2018-02-24 ENCOUNTER — Encounter (HOSPITAL_COMMUNITY): Payer: Self-pay | Admitting: *Deleted

## 2018-02-24 DIAGNOSIS — M3210 Systemic lupus erythematosus, organ or system involvement unspecified: Secondary | ICD-10-CM | POA: Diagnosis not present

## 2018-02-24 DIAGNOSIS — N186 End stage renal disease: Secondary | ICD-10-CM | POA: Diagnosis not present

## 2018-02-24 DIAGNOSIS — D631 Anemia in chronic kidney disease: Secondary | ICD-10-CM | POA: Diagnosis not present

## 2018-02-24 DIAGNOSIS — D509 Iron deficiency anemia, unspecified: Secondary | ICD-10-CM | POA: Diagnosis not present

## 2018-02-24 DIAGNOSIS — N2581 Secondary hyperparathyroidism of renal origin: Secondary | ICD-10-CM | POA: Diagnosis not present

## 2018-02-24 NOTE — Anesthesia Preprocedure Evaluation (Addendum)
Anesthesia Evaluation  Patient identified by MRN, date of birth, ID band Patient awake    Reviewed: Allergy & Precautions, NPO status , Patient's Chart, lab work & pertinent test results, reviewed documented beta blocker date and time   History of Anesthesia Complications Negative for: history of anesthetic complications  Airway Mallampati: II  TM Distance: >3 FB Neck ROM: Full    Dental  (+) Dental Advisory Given   Pulmonary Current Smoker,    breath sounds clear to auscultation       Cardiovascular hypertension, Pt. on medications and Pt. on home beta blockers (-) angina+ Valvular Problems/Murmurs AS and MR  Rhythm:Regular Rate:Normal + Systolic murmurs and + Diastolic murmurs 6/19 ECHO: EF 55-60%, functionally bicuspid Aortic valve with mod AS, mean grad 26 mmHg, calcified MV, with mod MS, mod MR   Neuro/Psych  Headaches, CVA, No Residual Symptoms    GI/Hepatic GERD  Medicated and Controlled,(+)     substance abuse  cocaine use and marijuana use,   Endo/Other  SLE  Renal/GU Dialysis and ESRFRenal disease (MWF, K+ 4.0)     Musculoskeletal   Abdominal   Peds  Hematology  (+) Blood dyscrasia (Hb 9.9), anemia ,   Anesthesia Other Findings   Reproductive/Obstetrics                            Anesthesia Physical Anesthesia Plan  ASA: IV  Anesthesia Plan: General   Post-op Pain Management:    Induction:   PONV Risk Score and Plan: 2 and Ondansetron and Dexamethasone  Airway Management Planned: LMA  Additional Equipment:   Intra-op Plan:   Post-operative Plan:   Informed Consent: I have reviewed the patients History and Physical, chart, labs and discussed the procedure including the risks, benefits and alternatives for the proposed anesthesia with the patient or authorized representative who has indicated his/her understanding and acceptance.   Dental advisory given  Plan  Discussed with: CRNA and Surgeon  Anesthesia Plan Comments: (Plan routine monitors, GA- LMA OK Pt prefers GA)       Anesthesia Quick Evaluation

## 2018-02-25 ENCOUNTER — Encounter (HOSPITAL_COMMUNITY)
Admission: RE | Disposition: A | Discharge status: Home / Self Care | Payer: Self-pay | Source: Ambulatory Visit | Attending: Vascular Surgery

## 2018-02-25 ENCOUNTER — Ambulatory Visit (HOSPITAL_COMMUNITY): Payer: Medicare Other | Admitting: Anesthesiology

## 2018-02-25 ENCOUNTER — Ambulatory Visit (HOSPITAL_COMMUNITY)
Admission: RE | Admit: 2018-02-25 | Discharge: 2018-02-25 | Disposition: A | Discharge status: Home / Self Care | Payer: Medicare Other | Source: Ambulatory Visit | Attending: Vascular Surgery | Admitting: Vascular Surgery

## 2018-02-25 ENCOUNTER — Ambulatory Visit (HOSPITAL_COMMUNITY): Payer: Medicare Other

## 2018-02-25 ENCOUNTER — Encounter (HOSPITAL_COMMUNITY): Payer: Self-pay | Admitting: Certified Registered Nurse Anesthetist

## 2018-02-25 DIAGNOSIS — Z8673 Personal history of transient ischemic attack (TIA), and cerebral infarction without residual deficits: Secondary | ICD-10-CM | POA: Diagnosis not present

## 2018-02-25 DIAGNOSIS — N186 End stage renal disease: Secondary | ICD-10-CM | POA: Insufficient documentation

## 2018-02-25 DIAGNOSIS — T8241XA Breakdown (mechanical) of vascular dialysis catheter, initial encounter: Secondary | ICD-10-CM | POA: Insufficient documentation

## 2018-02-25 DIAGNOSIS — Z992 Dependence on renal dialysis: Secondary | ICD-10-CM | POA: Insufficient documentation

## 2018-02-25 DIAGNOSIS — I509 Heart failure, unspecified: Secondary | ICD-10-CM | POA: Diagnosis not present

## 2018-02-25 DIAGNOSIS — T82898A Other specified complication of vascular prosthetic devices, implants and grafts, initial encounter: Secondary | ICD-10-CM | POA: Diagnosis not present

## 2018-02-25 DIAGNOSIS — I5032 Chronic diastolic (congestive) heart failure: Secondary | ICD-10-CM | POA: Diagnosis not present

## 2018-02-25 DIAGNOSIS — Z7982 Long term (current) use of aspirin: Secondary | ICD-10-CM | POA: Insufficient documentation

## 2018-02-25 DIAGNOSIS — Z419 Encounter for procedure for purposes other than remedying health state, unspecified: Secondary | ICD-10-CM

## 2018-02-25 DIAGNOSIS — Z79899 Other long term (current) drug therapy: Secondary | ICD-10-CM | POA: Insufficient documentation

## 2018-02-25 DIAGNOSIS — Y832 Surgical operation with anastomosis, bypass or graft as the cause of abnormal reaction of the patient, or of later complication, without mention of misadventure at the time of the procedure: Secondary | ICD-10-CM | POA: Insufficient documentation

## 2018-02-25 DIAGNOSIS — I132 Hypertensive heart and chronic kidney disease with heart failure and with stage 5 chronic kidney disease, or end stage renal disease: Secondary | ICD-10-CM | POA: Insufficient documentation

## 2018-02-25 DIAGNOSIS — K219 Gastro-esophageal reflux disease without esophagitis: Secondary | ICD-10-CM | POA: Insufficient documentation

## 2018-02-25 DIAGNOSIS — F17210 Nicotine dependence, cigarettes, uncomplicated: Secondary | ICD-10-CM | POA: Diagnosis not present

## 2018-02-25 DIAGNOSIS — T82590A Other mechanical complication of surgically created arteriovenous fistula, initial encounter: Secondary | ICD-10-CM | POA: Diagnosis not present

## 2018-02-25 DIAGNOSIS — M329 Systemic lupus erythematosus, unspecified: Secondary | ICD-10-CM | POA: Insufficient documentation

## 2018-02-25 HISTORY — PX: REVISON OF ARTERIOVENOUS FISTULA: SHX6074

## 2018-02-25 HISTORY — PX: FISTULOGRAM: SHX5832

## 2018-02-25 HISTORY — PX: AV FISTULA PLACEMENT: SHX1204

## 2018-02-25 LAB — POCT I-STAT 4, (NA,K, GLUC, HGB,HCT)
GLUCOSE: 92 mg/dL (ref 70–99)
HEMATOCRIT: 29 % — AB (ref 36.0–46.0)
Hemoglobin: 9.9 g/dL — ABNORMAL LOW (ref 12.0–15.0)
Potassium: 4 mmol/L (ref 3.5–5.1)
SODIUM: 139 mmol/L (ref 135–145)

## 2018-02-25 SURGERY — FISTULOGRAM
Anesthesia: General | Site: Arm Upper | Laterality: Right

## 2018-02-25 MED ORDER — ONDANSETRON HCL 4 MG/2ML IJ SOLN
INTRAMUSCULAR | Status: AC
  Filled 2018-02-25: qty 2

## 2018-02-25 MED ORDER — DEXAMETHASONE SODIUM PHOSPHATE 10 MG/ML IJ SOLN
INTRAMUSCULAR | Status: AC
  Filled 2018-02-25: qty 1

## 2018-02-25 MED ORDER — PROPOFOL 10 MG/ML IV BOLUS
INTRAVENOUS | Status: DC | PRN
  Administered 2018-02-25: 120 mg via INTRAVENOUS

## 2018-02-25 MED ORDER — PHENYLEPHRINE 40 MCG/ML (10ML) SYRINGE FOR IV PUSH (FOR BLOOD PRESSURE SUPPORT)
PREFILLED_SYRINGE | INTRAVENOUS | Status: DC | PRN
  Administered 2018-02-25: 80 ug via INTRAVENOUS

## 2018-02-25 MED ORDER — FENTANYL CITRATE (PF) 250 MCG/5ML IJ SOLN
INTRAMUSCULAR | Status: DC | PRN
  Administered 2018-02-25 (×2): 50 ug via INTRAVENOUS
  Administered 2018-02-25 (×2): 25 ug via INTRAVENOUS
  Administered 2018-02-25: 50 ug via INTRAVENOUS
  Administered 2018-02-25 (×2): 25 ug via INTRAVENOUS

## 2018-02-25 MED ORDER — HEPARIN SODIUM (PORCINE) 1000 UNIT/ML IJ SOLN
INTRAMUSCULAR | Status: AC
  Filled 2018-02-25: qty 1

## 2018-02-25 MED ORDER — SODIUM CHLORIDE 0.9 % IV SOLN
INTRAVENOUS | Status: AC
  Filled 2018-02-25: qty 1.2

## 2018-02-25 MED ORDER — SODIUM CHLORIDE 0.9 % IV SOLN
INTRAVENOUS | Status: DC
  Administered 2018-02-25: 07:00:00 via INTRAVENOUS

## 2018-02-25 MED ORDER — PROPOFOL 10 MG/ML IV BOLUS
INTRAVENOUS | Status: AC
  Filled 2018-02-25: qty 20

## 2018-02-25 MED ORDER — OXYCODONE-ACETAMINOPHEN 7.5-325 MG PO TABS
1.0000 | ORAL_TABLET | Freq: Once | ORAL | Status: AC
  Administered 2018-02-25: 1 via ORAL

## 2018-02-25 MED ORDER — ONDANSETRON HCL 4 MG/2ML IJ SOLN
INTRAMUSCULAR | Status: DC | PRN
  Administered 2018-02-25: 4 mg via INTRAVENOUS

## 2018-02-25 MED ORDER — LIDOCAINE HCL (CARDIAC) PF 100 MG/5ML IV SOSY
PREFILLED_SYRINGE | INTRAVENOUS | Status: DC | PRN
  Administered 2018-02-25: 40 mg via INTRAVENOUS

## 2018-02-25 MED ORDER — FENTANYL CITRATE (PF) 100 MCG/2ML IJ SOLN
25.0000 ug | INTRAMUSCULAR | Status: DC | PRN
  Administered 2018-02-25 (×3): 25 ug via INTRAVENOUS

## 2018-02-25 MED ORDER — MIDAZOLAM HCL 2 MG/2ML IJ SOLN
INTRAMUSCULAR | Status: AC
  Filled 2018-02-25: qty 2

## 2018-02-25 MED ORDER — SODIUM CHLORIDE 0.9 % IV SOLN
INTRAVENOUS | Status: DC | PRN
  Administered 2018-02-25: 500 mL

## 2018-02-25 MED ORDER — IODIXANOL 320 MG/ML IV SOLN
INTRAVENOUS | Status: DC | PRN
  Administered 2018-02-25: 20 mL

## 2018-02-25 MED ORDER — PHENYLEPHRINE 40 MCG/ML (10ML) SYRINGE FOR IV PUSH (FOR BLOOD PRESSURE SUPPORT)
PREFILLED_SYRINGE | INTRAVENOUS | Status: AC
  Filled 2018-02-25: qty 10

## 2018-02-25 MED ORDER — MIDAZOLAM HCL 2 MG/2ML IJ SOLN: 0.5000 mg | Freq: Once | INTRAMUSCULAR | Status: DC | PRN

## 2018-02-25 MED ORDER — HEPARIN SODIUM (PORCINE) 1000 UNIT/ML IJ SOLN
INTRAMUSCULAR | Status: DC | PRN
  Administered 2018-02-25: 5000 [IU] via INTRAVENOUS

## 2018-02-25 MED ORDER — OXYCODONE-ACETAMINOPHEN 7.5-325 MG PO TABS: 1.0000 | ORAL_TABLET | Freq: Four times a day (QID) | ORAL | 0 refills | Status: DC | PRN

## 2018-02-25 MED ORDER — FENTANYL CITRATE (PF) 250 MCG/5ML IJ SOLN
INTRAMUSCULAR | Status: AC
  Filled 2018-02-25: qty 5

## 2018-02-25 MED ORDER — MEPERIDINE HCL 50 MG/ML IJ SOLN: 6.2500 mg | INTRAMUSCULAR | Status: DC | PRN

## 2018-02-25 MED ORDER — OXYCODONE-ACETAMINOPHEN 7.5-325 MG PO TABS
ORAL_TABLET | ORAL | Status: AC
  Filled 2018-02-25: qty 1

## 2018-02-25 MED ORDER — SODIUM CHLORIDE 0.9 % IV SOLN
INTRAVENOUS | Status: DC | PRN
  Administered 2018-02-25: 25 ug/min via INTRAVENOUS

## 2018-02-25 MED ORDER — PROTAMINE SULFATE 10 MG/ML IV SOLN
INTRAVENOUS | Status: AC
  Filled 2018-02-25: qty 5

## 2018-02-25 MED ORDER — LIDOCAINE-EPINEPHRINE (PF) 1 %-1:200000 IJ SOLN
INTRAMUSCULAR | Status: AC
  Filled 2018-02-25: qty 30

## 2018-02-25 MED ORDER — 0.9 % SODIUM CHLORIDE (POUR BTL) OPTIME
TOPICAL | Status: DC | PRN
  Administered 2018-02-25: 1000 mL

## 2018-02-25 MED ORDER — PROMETHAZINE HCL 25 MG/ML IJ SOLN: 6.2500 mg | INTRAMUSCULAR | Status: DC | PRN

## 2018-02-25 MED ORDER — PROTAMINE SULFATE 10 MG/ML IV SOLN
INTRAVENOUS | Status: DC | PRN
  Administered 2018-02-25: 50 mg via INTRAVENOUS

## 2018-02-25 MED ORDER — LIDOCAINE 2% (20 MG/ML) 5 ML SYRINGE
INTRAMUSCULAR | Status: AC
  Filled 2018-02-25: qty 5

## 2018-02-25 MED ORDER — HEMOSTATIC AGENTS (NO CHARGE) OPTIME
TOPICAL | Status: DC | PRN
  Administered 2018-02-25: 1 via TOPICAL

## 2018-02-25 MED ORDER — CEFAZOLIN SODIUM-DEXTROSE 2-4 GM/100ML-% IV SOLN
2.0000 g | INTRAVENOUS | Status: AC
  Administered 2018-02-25: 2 g via INTRAVENOUS
  Filled 2018-02-25: qty 100

## 2018-02-25 MED ORDER — FENTANYL CITRATE (PF) 100 MCG/2ML IJ SOLN
INTRAMUSCULAR | Status: AC
  Filled 2018-02-25: qty 2

## 2018-02-25 MED ORDER — LIDOCAINE HCL (PF) 1 % IJ SOLN
INTRAMUSCULAR | Status: AC
  Filled 2018-02-25: qty 30

## 2018-02-25 MED ORDER — DEXAMETHASONE SODIUM PHOSPHATE 10 MG/ML IJ SOLN
INTRAMUSCULAR | Status: DC | PRN
  Administered 2018-02-25: 10 mg via INTRAVENOUS

## 2018-02-25 MED ORDER — MIDAZOLAM HCL 2 MG/2ML IJ SOLN
INTRAMUSCULAR | Status: DC | PRN
  Administered 2018-02-25: 2 mg via INTRAVENOUS

## 2018-02-25 SURGICAL SUPPLY — 70 items
ADH SKN CLS APL DERMABOND .7 (GAUZE/BANDAGES/DRESSINGS) ×2
ARMBAND PINK RESTRICT EXTREMIT (MISCELLANEOUS) ×4 IMPLANT
BAG DECANTER FOR FLEXI CONT (MISCELLANEOUS) ×4 IMPLANT
BANDAGE ACE 4X5 VEL STRL LF (GAUZE/BANDAGES/DRESSINGS) ×3 IMPLANT
BIOPATCH RED 1 DISK 7.0 (GAUZE/BANDAGES/DRESSINGS) ×3 IMPLANT
BIOPATCH RED 1IN DISK 7.0MM (GAUZE/BANDAGES/DRESSINGS) ×1
CANISTER SUCT 3000ML PPV (MISCELLANEOUS) ×4 IMPLANT
CATH PALINDROME RT-P 15FX19CM (CATHETERS) IMPLANT
CATH PALINDROME RT-P 15FX23CM (CATHETERS) IMPLANT
CATH PALINDROME RT-P 15FX28CM (CATHETERS) IMPLANT
CATH PALINDROME RT-P 15FX55CM (CATHETERS) IMPLANT
CLIP VESOCCLUDE MED 6/CT (CLIP) ×4 IMPLANT
CLIP VESOCCLUDE SM WIDE 6/CT (CLIP) ×4 IMPLANT
COVER PROBE W GEL 5X96 (DRAPES) ×4 IMPLANT
COVER SURGICAL LIGHT HANDLE (MISCELLANEOUS) ×4 IMPLANT
COVER WAND RF STERILE (DRAPES) ×4 IMPLANT
DERMABOND ADVANCED (GAUZE/BANDAGES/DRESSINGS) ×2
DERMABOND ADVANCED .7 DNX12 (GAUZE/BANDAGES/DRESSINGS) ×2 IMPLANT
DRAPE C-ARM 42X72 X-RAY (DRAPES) ×7 IMPLANT
DRAPE CHEST BREAST 15X10 FENES (DRAPES) ×4 IMPLANT
DRSG TEGADERM 4X4.75 (GAUZE/BANDAGES/DRESSINGS) ×3 IMPLANT
ELECT REM PT RETURN 9FT ADLT (ELECTROSURGICAL) ×4
ELECTRODE REM PT RTRN 9FT ADLT (ELECTROSURGICAL) ×2 IMPLANT
GAUZE 4X4 16PLY RFD (DISPOSABLE) ×4 IMPLANT
GLOVE BIO SURGEON STRL SZ7.5 (GLOVE) ×4 IMPLANT
GLOVE BIOGEL PI IND STRL 7.0 (GLOVE) ×1 IMPLANT
GLOVE BIOGEL PI INDICATOR 7.0 (GLOVE) ×2
GLOVE SS BIOGEL STRL SZ 7 (GLOVE) ×1 IMPLANT
GLOVE SUPERSENSE BIOGEL SZ 7 (GLOVE) ×2
GOWN STRL REUS W/ TWL LRG LVL3 (GOWN DISPOSABLE) ×4 IMPLANT
GOWN STRL REUS W/ TWL XL LVL3 (GOWN DISPOSABLE) ×2 IMPLANT
GOWN STRL REUS W/TWL LRG LVL3 (GOWN DISPOSABLE) ×8
GOWN STRL REUS W/TWL XL LVL3 (GOWN DISPOSABLE) ×4
GRAFT COLLAGEN VASCULAR 6X15 (Vascular Products) ×2 IMPLANT
GRAFT COLLAGEN VASCULAR 6X19 (Vascular Products) ×1 IMPLANT
HEMOSTAT SNOW SURGICEL 2X4 (HEMOSTASIS) ×3 IMPLANT
KIT BASIN OR (CUSTOM PROCEDURE TRAY) ×4 IMPLANT
KIT TURNOVER KIT B (KITS) ×4 IMPLANT
NDL 18GX1X1/2 (RX/OR ONLY) (NEEDLE) ×1 IMPLANT
NDL HYPO 25GX1X1/2 BEV (NEEDLE) ×1 IMPLANT
NEEDLE 18GX1X1/2 (RX/OR ONLY) (NEEDLE) ×4 IMPLANT
NEEDLE HYPO 25GX1X1/2 BEV (NEEDLE) ×4 IMPLANT
NS IRRIG 1000ML POUR BTL (IV SOLUTION) ×4 IMPLANT
PACK CV ACCESS (CUSTOM PROCEDURE TRAY) ×4 IMPLANT
PACK SURGICAL SETUP 50X90 (CUSTOM PROCEDURE TRAY) ×4 IMPLANT
PAD ARMBOARD 7.5X6 YLW CONV (MISCELLANEOUS) ×8 IMPLANT
SET MICROPUNCTURE 5F STIFF (MISCELLANEOUS) ×3 IMPLANT
SHEATH PINNACLE 6F 10CM (SHEATH) ×3 IMPLANT
SOAP 2 % CHG 4 OZ (WOUND CARE) ×4 IMPLANT
STOPCOCK MORSE 400PSI 3WAY (MISCELLANEOUS) ×3 IMPLANT
SUT ETHILON 3 0 PS 1 (SUTURE) ×4 IMPLANT
SUT MNCRL AB 4-0 PS2 18 (SUTURE) ×7 IMPLANT
SUT PROLENE 5 0 C 1 24 (SUTURE) ×9 IMPLANT
SUT PROLENE 5 0 C 1 36 (SUTURE) ×6 IMPLANT
SUT PROLENE 6 0 BV (SUTURE) ×4 IMPLANT
SUT VIC AB 3-0 SH 27 (SUTURE) ×8
SUT VIC AB 3-0 SH 27X BRD (SUTURE) ×3 IMPLANT
SYR 10ML LL (SYRINGE) ×4 IMPLANT
SYR 20CC LL (SYRINGE) ×8 IMPLANT
SYR 5ML LL (SYRINGE) ×4 IMPLANT
SYR CONTROL 10ML LL (SYRINGE) ×4 IMPLANT
SYR TOOMEY 50ML (SYRINGE) ×3 IMPLANT
TAPE STRIPS DRAPE STRL (GAUZE/BANDAGES/DRESSINGS) ×3 IMPLANT
TOWEL GREEN STERILE (TOWEL DISPOSABLE) ×4 IMPLANT
TOWEL GREEN STERILE FF (TOWEL DISPOSABLE) ×4 IMPLANT
TOWEL OR 17X26 4PK STRL BLUE (TOWEL DISPOSABLE) ×4 IMPLANT
TUBING CIL FLEX 10 FLL-RA (TUBING) ×3 IMPLANT
UNDERPAD 30X30 (UNDERPADS AND DIAPERS) ×4 IMPLANT
WATER STERILE IRR 1000ML POUR (IV SOLUTION) ×4 IMPLANT
WIRE BENTSON .035X145CM (WIRE) ×3 IMPLANT

## 2018-02-25 NOTE — H&P (Signed)
   History and Physical Update  The patient was interviewed and re-examined.  The patient's previous History and Physical has been reviewed and is unchanged from recent office visit. Plan for right arm fistulogram interposition artegraft.  Hanif Radin C. Donzetta Matters, MD [facility] [place] Office: [phone] Pager: [phone]   02/25/2018, 7:19 AM

## 2018-02-25 NOTE — Anesthesia Procedure Notes (Signed)
Procedure Name: LMA Insertion Date/Time: 02/25/2018 7:38 AM Performed by: Raenette Rover, CRNA Pre-anesthesia Checklist: Patient identified, Emergency Drugs available, Suction available and Patient being monitored Patient Re-evaluated:Patient Re-evaluated prior to induction Oxygen Delivery Method: Circle system utilized Preoxygenation: Pre-oxygenation with 100% oxygen Induction Type: IV induction LMA: LMA inserted LMA Size: 4.0 Number of attempts: 1 Placement Confirmation: positive ETCO2,  CO2 detector and breath sounds checked- equal and bilateral Tube secured with: Tape Dental Injury: Teeth and Oropharynx as per pre-operative assessment

## 2018-02-25 NOTE — Transfer of Care (Signed)
Immediate Anesthesia Transfer of Care Note  Patient: [name]  Procedure(s) Performed: FISTULOGRAM ARM (Right Arm Upper) REVISION OF ARTERIOVENOUS FISTULA (Right Arm Upper) INSERTION OF 80mm x 16cm ARTEGRAFT (Right Arm Upper)  Patient Location: PACU  Anesthesia Type:General  Level of Consciousness: awake, alert , oriented and patient cooperative  Airway & Oxygen Therapy: Patient Spontanous Breathing and Patient connected to nasal cannula oxygen  Post-op Assessment: Report given to RN and Post -op Vital signs reviewed and stable  Post vital signs: Reviewed and stable  Last Vitals:  Vitals Value Taken Time  BP 206/123 02/25/2018  9:46 AM  Temp    Pulse 91 02/25/2018  9:47 AM  Resp 16 02/25/2018  9:47 AM  SpO2 92 % 02/25/2018  9:47 AM  Vitals shown include unvalidated device data.  Last Pain:  Vitals:   02/25/18 0559  TempSrc: Oral      Patients Stated Pain Goal: 3 (01/00/71 2197)  Complications: No apparent anesthesia complications

## 2018-02-25 NOTE — Anesthesia Postprocedure Evaluation (Signed)
Anesthesia Post Note  Patient: Jakaila Norment  Procedure(s) Performed: FISTULOGRAM ARM (Right Arm Upper) REVISION OF ARTERIOVENOUS FISTULA (Right Arm Upper) INSERTION OF 30mm x 16cm ARTEGRAFT (Right Arm Upper)     Patient location during evaluation: PACU Anesthesia Type: General Level of consciousness: awake and alert, oriented and patient cooperative Pain management: pain level controlled Vital Signs Assessment: post-procedure vital signs reviewed and stable Respiratory status: spontaneous breathing, nonlabored ventilation and respiratory function stable Cardiovascular status: blood pressure returned to baseline and stable Postop Assessment: no apparent nausea or vomiting Anesthetic complications: no    Last Vitals:  Vitals:   02/25/18 1100 02/25/18 1110  BP:  (!) 147/87  Pulse: 79 79  Resp: 11 12  Temp:  (!) 36.4 C  SpO2: 93% 97%    Last Pain:  Vitals:   02/25/18 1045  TempSrc:   PainSc: 3                  Keonte Daubenspeck,E. Mickey Hebel

## 2018-02-25 NOTE — Op Note (Signed)
    Patient name: [name] MRN: [id number] DOB: 1961/04/10 Sex: female  02/25/2018 Pre-operative Diagnosis: End-stage renal disease, malfunction right arm AV fistula Post-operative diagnosis:  Same Surgeon:  Eda Paschal. Donzetta Matters, MD Procedure Performed: 1.  Right upper extremity fistulogram 2.  Revision of right arm fistula with interposition Artegraft  Indications: 57 year old female with history of end-stage renal disease on dialysis via right upper arm fistula for many years.  This was recently revised with Artegraft interposition.  She now has a second area of pseudoaneurysmal degeneration and has pulsatility in the fistula.  She is indicated for fistulogram possible intervention and revision.  Findings: There is possibly a central stenosis in the fistula but it does have good runoff and a thrill at the shoulder.  After interposition grafting there is strong signal in the runoff vein although there does remain some pulsatility.  There should be adequate area to stick this fistula and no catheter was placed.    She may require formal fistulogram in the near future to address her central occlusive process.   Procedure:  The patient was identified in the holding area and taken to the operating room where she is placed supine the operating table and general anesthesia was induced.  She was sterilely prepped and draped in the right upper arm and shoulder in the usual fashion she was given antibiotics and a timeout was called.  We began with ultrasound-guided evaluation of her right upper arm fistula cannulated this near the shoulder.  We placed a micropuncture sheath.  Fistulogram was performed with the above findings.  We then removed the sheath suture closed this with 4-0 Monocryl.  We then began with longitudinal incisions above her previous revision and near our cannulation site.  As we were dissecting out the proximal to good control we did injure the fistula.  At this time pressure was held  patient was heparinized fully.  We then repaired the fistula.  We then were able to get proximal distal control and clamp the fistula first proximally then distally and divided it.  We then dissected through the tunnel removed the entire pseudoaneurysmal degeneration aspect.  The Artegraft was then brought to the table spatulated sewn to the proximal aspect of the fistula with 5-0 Prolene suture and and.  We then flushed through the Artegraft marker for orientation and tunneled it.  We then reclamped it and sewed and and to the distal with 5-0 Prolene suture.  Prior to completing this anastomosis we allowed flushing in all directions.  Upon completion there was a good signal as well as a thrill although there was a minimal pulsatility.  There was also palpable radial pulse confirmed with Doppler.  Satisfied with this we gave 50 mg of protamine.  She did have significant oozing from the tissue where the pseudoaneurysm was excised.  We closed the skin in layers with 3-0 Vicryl interrupted followed by 4-0 Monocryl and Dermabond was placed.  A light Ace wrap was placed in the arm.  She tolerated this procedure without immediate complication.  All counts were correct at completion  EBL 600 cc.    Itzabella Sorrels C. Donzetta Matters, MD [facility] [place] Office: [phone] Pager: [phone]

## 2018-02-26 ENCOUNTER — Encounter (HOSPITAL_COMMUNITY): Payer: Self-pay | Admitting: Vascular Surgery

## 2018-02-26 ENCOUNTER — Telehealth: Payer: Self-pay | Admitting: Vascular Surgery

## 2018-02-26 DIAGNOSIS — D631 Anemia in chronic kidney disease: Secondary | ICD-10-CM | POA: Diagnosis not present

## 2018-02-26 DIAGNOSIS — D509 Iron deficiency anemia, unspecified: Secondary | ICD-10-CM | POA: Diagnosis not present

## 2018-02-26 DIAGNOSIS — N186 End stage renal disease: Secondary | ICD-10-CM | POA: Diagnosis not present

## 2018-02-26 DIAGNOSIS — N2581 Secondary hyperparathyroidism of renal origin: Secondary | ICD-10-CM | POA: Diagnosis not present

## 2018-02-26 DIAGNOSIS — M3210 Systemic lupus erythematosus, organ or system involvement unspecified: Secondary | ICD-10-CM | POA: Diagnosis not present

## 2018-02-26 NOTE — Telephone Encounter (Signed)
sch appt spk to pt 03/27/18 2pm p/o PA

## 2018-02-28 DIAGNOSIS — D509 Iron deficiency anemia, unspecified: Secondary | ICD-10-CM | POA: Diagnosis not present

## 2018-02-28 DIAGNOSIS — D631 Anemia in chronic kidney disease: Secondary | ICD-10-CM | POA: Diagnosis not present

## 2018-02-28 DIAGNOSIS — N2581 Secondary hyperparathyroidism of renal origin: Secondary | ICD-10-CM | POA: Diagnosis not present

## 2018-02-28 DIAGNOSIS — M3210 Systemic lupus erythematosus, organ or system involvement unspecified: Secondary | ICD-10-CM | POA: Diagnosis not present

## 2018-02-28 DIAGNOSIS — N186 End stage renal disease: Secondary | ICD-10-CM | POA: Diagnosis not present

## 2018-03-03 DIAGNOSIS — N2581 Secondary hyperparathyroidism of renal origin: Secondary | ICD-10-CM | POA: Diagnosis not present

## 2018-03-03 DIAGNOSIS — M3210 Systemic lupus erythematosus, organ or system involvement unspecified: Secondary | ICD-10-CM | POA: Diagnosis not present

## 2018-03-03 DIAGNOSIS — N186 End stage renal disease: Secondary | ICD-10-CM | POA: Diagnosis not present

## 2018-03-03 DIAGNOSIS — D509 Iron deficiency anemia, unspecified: Secondary | ICD-10-CM | POA: Diagnosis not present

## 2018-03-03 DIAGNOSIS — D631 Anemia in chronic kidney disease: Secondary | ICD-10-CM | POA: Diagnosis not present

## 2018-03-05 ENCOUNTER — Telehealth: Payer: Self-pay | Admitting: Vascular Surgery

## 2018-03-05 ENCOUNTER — Encounter: Payer: Self-pay | Admitting: *Deleted

## 2018-03-05 ENCOUNTER — Other Ambulatory Visit: Payer: Self-pay

## 2018-03-05 ENCOUNTER — Other Ambulatory Visit: Payer: Self-pay | Admitting: *Deleted

## 2018-03-05 ENCOUNTER — Ambulatory Visit (INDEPENDENT_AMBULATORY_CARE_PROVIDER_SITE_OTHER): Payer: Medicare Other | Admitting: Physician Assistant

## 2018-03-05 ENCOUNTER — Encounter (HOSPITAL_COMMUNITY): Payer: Self-pay | Admitting: *Deleted

## 2018-03-05 VITALS — BP 150/73 | HR 93 | Temp 97.3°F | Resp 14 | Ht 66.5 in | Wt 131.0 lb

## 2018-03-05 DIAGNOSIS — N186 End stage renal disease: Secondary | ICD-10-CM | POA: Diagnosis not present

## 2018-03-05 DIAGNOSIS — D509 Iron deficiency anemia, unspecified: Secondary | ICD-10-CM | POA: Diagnosis not present

## 2018-03-05 DIAGNOSIS — M3210 Systemic lupus erythematosus, organ or system involvement unspecified: Secondary | ICD-10-CM | POA: Diagnosis not present

## 2018-03-05 DIAGNOSIS — N2581 Secondary hyperparathyroidism of renal origin: Secondary | ICD-10-CM | POA: Diagnosis not present

## 2018-03-05 DIAGNOSIS — Z992 Dependence on renal dialysis: Secondary | ICD-10-CM

## 2018-03-05 DIAGNOSIS — D631 Anemia in chronic kidney disease: Secondary | ICD-10-CM | POA: Diagnosis not present

## 2018-03-05 NOTE — Progress Notes (Signed)
POST OPERATIVE OFFICE NOTE    CC:  F/u for surgery  HPI:  This is a 57 y.o. female who is s/p fistulogram and revision of right arm fistula by Dr. Donzetta Matters on 02/25/18.  She is a work in today for concerns of blistering and redness in between her previous revision incisions.  She states that she had an ace bandage on after surgery and it was taken off the next day for dialysis.  She states that this blister and redness was not present prior to surgery.  She denies any fevers.  There has been some bloody drainage from the distal incision, but no purulent drainage.   She denies any pain in her right hand.   No Known Allergies  Current Outpatient Medications  Medication Sig Dispense Refill  . acetaminophen (TYLENOL) 500 MG tablet Take 1,000 mg by mouth as needed for mild pain.    [name] amLODipine (NORVASC) 10 MG tablet Take 10 mg by mouth at bedtime.     [name] aspirin EC 81 MG tablet Take 81 mg by mouth at bedtime.    . calcitRIOL (ROCALTROL) 0.25 MCG capsule Take 7 capsules (1.75 mcg total) by mouth every other day. 14 capsule 0  . cloNIDine (CATAPRES) 0.2 MG tablet TAKE 1 TABLET(0.2 MG) BY MOUTH TWICE DAILY (Patient taking differently: Take 0.2 mg by mouth 2 (two) times daily. ) 180 tablet 3  . labetalol (NORMODYNE) 300 MG tablet Take 2 tablets (600 mg total) by mouth 3 (three) times daily. 90 tablet 0  . lanthanum (FOSRENOL) 1000 MG chewable tablet Chew 2 tablets (2,000 mg total) by mouth 3 (three) times daily with meals. (Patient taking differently: Chew 1,000-2,000 mg by mouth 3 (three) times daily with meals. ) 180 tablet 0  . multivitamin (RENA-VIT) TABS tablet Take 1 tablet by mouth daily.    . ondansetron (ZOFRAN) 4 MG tablet Take 4 mg by mouth every 6 (six) hours as needed for nausea or vomiting.    [name] oxyCODONE-acetaminophen (PERCOCET) 7.5-325 MG tablet Take 1 tablet by mouth every 6 (six) hours as needed for severe pain. 20 tablet 0  . pantoprazole (PROTONIX) 40 MG tablet Take 40 mg by mouth 2  (two) times daily.      No current facility-administered medications for this visit.      ROS:  See HPI  Physical Exam:  Today's Vitals   03/05/18 1450 03/05/18 1455  BP: (!) 145/76 (!) 150/73  Pulse: 92 93  Resp: 14   Temp: (!) 97.3 F (36.3 C)   TempSrc: Oral   SpO2: 100%   Weight: 131 lb (59.4 kg)   Height: 5' 6.5" (1.689 m)   PainSc: 7     Body mass index is 20.83 kg/m.  Incision:  Distal and proximal incisions are healing; she has some blistering and redness of the fistula in between the incisions.     Extremities:  Easily palpable right radial pulse.    Assessment/Plan:  This is a 57 y.o. female who is s/p: 1.  Right upper extremity fistulogram 2.  Revision of right arm fistula with interposition Artegraft By Dr. Donzetta Matters on 02/25/18  -pt seen and examined with Dr. Oneida Alar.  There is concern for skin breakdown over the fistula with swelling/hematoma.  Dr. Oneida Alar used sonosite to evaulate arm and he discussed with pt that the fistula will need to rest to allow time for healing and will need insertion of tunneled dialysis catheter.   He also discussed with her that despite  drainage of hematoma, her fistula may not be salvageable and may need new access eventually but hopeful with resting it that it will be salvageable.  -plan for drainage of hematoma and insertion of tunneled dialysis catheter tomorrow by Dr. Donzetta Matters.   Connie Locket, PA-C [facility] [phone]  Clinic MD:  Pt seen and examined by Dr. Oneida Alar

## 2018-03-05 NOTE — Progress Notes (Signed)
Pt denies SOB and chest pain, Pt under the care of Dr. Harrington Challenger, Cardiology.  Pt denies having a cardiac cath. Pt made aware to stop taking vitamins, fish oil and herbal medications. Do not take any NSAIDs ie: Ibuprofen, Advil, Naproxen (Aleve), Motrin, BC and Goody Powder. Pt verbalized understanding of all pre-op instructions.

## 2018-03-05 NOTE — Telephone Encounter (Signed)
Dr. Jannifer Hick called from [facility] [address] pt needs to be seen ASAP for a soft-ball size place over her R arm incision.  Place has fluid-filled bullae (blood tinged) and is developing possible induration.  Has quarter and dime sized openings around incision site.     Thurston Hole., LPN

## 2018-03-05 NOTE — Progress Notes (Signed)
Vitals:   03/05/18 1450  BP: (!) 145/76  Pulse: 92  Resp: 14  Temp: (!) 97.3 F (36.3 C)  TempSrc: Oral  SpO2: 100%  Weight: 131 lb (59.4 kg)  Height: 5' 6.5" (1.689 m)

## 2018-03-06 ENCOUNTER — Ambulatory Visit (HOSPITAL_COMMUNITY): Payer: Medicare Other | Admitting: Certified Registered Nurse Anesthetist

## 2018-03-06 ENCOUNTER — Other Ambulatory Visit: Payer: Self-pay

## 2018-03-06 ENCOUNTER — Ambulatory Visit (HOSPITAL_COMMUNITY)
Admission: RE | Admit: 2018-03-06 | Discharge: 2018-03-06 | Disposition: A | Discharge status: Home / Self Care | Payer: Medicare Other | Source: Ambulatory Visit | Attending: Vascular Surgery | Admitting: Vascular Surgery

## 2018-03-06 ENCOUNTER — Ambulatory Visit (HOSPITAL_COMMUNITY): Payer: Medicare Other

## 2018-03-06 ENCOUNTER — Encounter (HOSPITAL_COMMUNITY): Payer: Self-pay | Admitting: *Deleted

## 2018-03-06 ENCOUNTER — Encounter (HOSPITAL_COMMUNITY)
Admission: RE | Disposition: A | Discharge status: Home / Self Care | Payer: Self-pay | Source: Ambulatory Visit | Attending: Vascular Surgery

## 2018-03-06 DIAGNOSIS — Z95828 Presence of other vascular implants and grafts: Secondary | ICD-10-CM

## 2018-03-06 DIAGNOSIS — Y832 Surgical operation with anastomosis, bypass or graft as the cause of abnormal reaction of the patient, or of later complication, without mention of misadventure at the time of the procedure: Secondary | ICD-10-CM | POA: Diagnosis not present

## 2018-03-06 DIAGNOSIS — N186 End stage renal disease: Secondary | ICD-10-CM | POA: Diagnosis not present

## 2018-03-06 DIAGNOSIS — Z992 Dependence on renal dialysis: Secondary | ICD-10-CM | POA: Diagnosis not present

## 2018-03-06 DIAGNOSIS — F17210 Nicotine dependence, cigarettes, uncomplicated: Secondary | ICD-10-CM | POA: Diagnosis not present

## 2018-03-06 DIAGNOSIS — I120 Hypertensive chronic kidney disease with stage 5 chronic kidney disease or end stage renal disease: Secondary | ICD-10-CM | POA: Insufficient documentation

## 2018-03-06 DIAGNOSIS — J9811 Atelectasis: Secondary | ICD-10-CM | POA: Diagnosis not present

## 2018-03-06 DIAGNOSIS — S40021A Contusion of right upper arm, initial encounter: Secondary | ICD-10-CM | POA: Diagnosis not present

## 2018-03-06 DIAGNOSIS — T82898A Other specified complication of vascular prosthetic devices, implants and grafts, initial encounter: Secondary | ICD-10-CM | POA: Diagnosis not present

## 2018-03-06 DIAGNOSIS — Z8673 Personal history of transient ischemic attack (TIA), and cerebral infarction without residual deficits: Secondary | ICD-10-CM | POA: Diagnosis not present

## 2018-03-06 DIAGNOSIS — Z452 Encounter for adjustment and management of vascular access device: Secondary | ICD-10-CM | POA: Diagnosis not present

## 2018-03-06 HISTORY — PX: HEMATOMA EVACUATION: SHX5118

## 2018-03-06 HISTORY — PX: INSERTION OF DIALYSIS CATHETER: SHX1324

## 2018-03-06 HISTORY — DX: Other specified postprocedural states: Z98.890

## 2018-03-06 HISTORY — DX: Nausea with vomiting, unspecified: R11.2

## 2018-03-06 LAB — POCT I-STAT 4, (NA,K, GLUC, HGB,HCT)
Glucose, Bld: 97 mg/dL (ref 70–99)
HCT: 21 % — ABNORMAL LOW (ref 36.0–46.0)
HEMOGLOBIN: 7.1 g/dL — AB (ref 12.0–15.0)
POTASSIUM: 4.1 mmol/L (ref 3.5–5.1)
Sodium: 136 mmol/L (ref 135–145)

## 2018-03-06 SURGERY — EVACUATION HEMATOMA
Anesthesia: General | Site: Chest | Laterality: Right

## 2018-03-06 MED ORDER — EPHEDRINE SULFATE 50 MG/ML IJ SOLN
INTRAMUSCULAR | Status: DC | PRN
  Administered 2018-03-06: 15 mg via INTRAVENOUS
  Administered 2018-03-06 (×2): 10 mg via INTRAVENOUS

## 2018-03-06 MED ORDER — PHENYLEPHRINE HCL 10 MG/ML IJ SOLN
INTRAMUSCULAR | Status: DC | PRN
  Administered 2018-03-06 (×3): 120 ug via INTRAVENOUS

## 2018-03-06 MED ORDER — MIDAZOLAM HCL 2 MG/2ML IJ SOLN
INTRAMUSCULAR | Status: AC
  Filled 2018-03-06: qty 2

## 2018-03-06 MED ORDER — MIDAZOLAM HCL 2 MG/2ML IJ SOLN
INTRAMUSCULAR | Status: DC | PRN
  Administered 2018-03-06: 2 mg via INTRAVENOUS

## 2018-03-06 MED ORDER — FENTANYL CITRATE (PF) 100 MCG/2ML IJ SOLN
INTRAMUSCULAR | Status: AC
  Filled 2018-03-06: qty 2

## 2018-03-06 MED ORDER — ONDANSETRON HCL 4 MG/2ML IJ SOLN
INTRAMUSCULAR | Status: DC | PRN
  Administered 2018-03-06: 4 mg via INTRAVENOUS

## 2018-03-06 MED ORDER — DEXAMETHASONE SODIUM PHOSPHATE 10 MG/ML IJ SOLN
INTRAMUSCULAR | Status: AC
  Filled 2018-03-06: qty 1

## 2018-03-06 MED ORDER — FENTANYL CITRATE (PF) 250 MCG/5ML IJ SOLN
INTRAMUSCULAR | Status: AC
  Filled 2018-03-06: qty 5

## 2018-03-06 MED ORDER — CEFAZOLIN SODIUM-DEXTROSE 2-4 GM/100ML-% IV SOLN
INTRAVENOUS | Status: AC
  Filled 2018-03-06: qty 100

## 2018-03-06 MED ORDER — OXYCODONE HCL 5 MG PO TABS
ORAL_TABLET | ORAL | Status: AC
  Filled 2018-03-06: qty 1

## 2018-03-06 MED ORDER — HEPARIN SODIUM (PORCINE) 1000 UNIT/ML IJ SOLN
INTRAMUSCULAR | Status: AC
  Filled 2018-03-06: qty 1

## 2018-03-06 MED ORDER — LIDOCAINE 2% (20 MG/ML) 5 ML SYRINGE
INTRAMUSCULAR | Status: AC
  Filled 2018-03-06: qty 5

## 2018-03-06 MED ORDER — PROPOFOL 500 MG/50ML IV EMUL
INTRAVENOUS | Status: DC | PRN
  Administered 2018-03-06: 25 ug/kg/min via INTRAVENOUS

## 2018-03-06 MED ORDER — OXYCODONE HCL 5 MG/5ML PO SOLN: 5.0000 mg | Freq: Once | ORAL | Status: AC | PRN

## 2018-03-06 MED ORDER — PHENYLEPHRINE 40 MCG/ML (10ML) SYRINGE FOR IV PUSH (FOR BLOOD PRESSURE SUPPORT)
PREFILLED_SYRINGE | INTRAVENOUS | Status: AC
  Filled 2018-03-06: qty 10

## 2018-03-06 MED ORDER — OXYCODONE HCL 5 MG PO TABS
5.0000 mg | ORAL_TABLET | Freq: Once | ORAL | Status: AC | PRN
  Administered 2018-03-06: 5 mg via ORAL

## 2018-03-06 MED ORDER — PROPOFOL 10 MG/ML IV BOLUS
INTRAVENOUS | Status: DC | PRN
  Administered 2018-03-06: 170 mg via INTRAVENOUS

## 2018-03-06 MED ORDER — ONDANSETRON HCL 4 MG/2ML IJ SOLN
INTRAMUSCULAR | Status: AC
  Filled 2018-03-06: qty 2

## 2018-03-06 MED ORDER — SODIUM CHLORIDE 0.9 % IV SOLN
INTRAVENOUS | Status: DC | PRN
  Administered 2018-03-06: 25 ug/min via INTRAVENOUS

## 2018-03-06 MED ORDER — LIDOCAINE 2% (20 MG/ML) 5 ML SYRINGE
INTRAMUSCULAR | Status: DC | PRN
  Administered 2018-03-06: 60 mg via INTRAVENOUS

## 2018-03-06 MED ORDER — SODIUM CHLORIDE 0.9 % IV SOLN: INTRAVENOUS | Status: DC

## 2018-03-06 MED ORDER — SODIUM CHLORIDE 0.9 % IV SOLN
INTRAVENOUS | Status: DC
  Administered 2018-03-06 (×2): via INTRAVENOUS

## 2018-03-06 MED ORDER — OXYCODONE-ACETAMINOPHEN 7.5-325 MG PO TABS: 1.0000 | ORAL_TABLET | Freq: Four times a day (QID) | ORAL | 0 refills | Status: DC | PRN

## 2018-03-06 MED ORDER — 0.9 % SODIUM CHLORIDE (POUR BTL) OPTIME
TOPICAL | Status: DC | PRN
  Administered 2018-03-06: 1000 mL

## 2018-03-06 MED ORDER — DEXAMETHASONE SODIUM PHOSPHATE 4 MG/ML IJ SOLN: 8.0000 mg | Freq: Once | INTRAMUSCULAR | Status: DC | PRN

## 2018-03-06 MED ORDER — DEXAMETHASONE SODIUM PHOSPHATE 10 MG/ML IJ SOLN
INTRAMUSCULAR | Status: DC | PRN
  Administered 2018-03-06: 10 mg via INTRAVENOUS

## 2018-03-06 MED ORDER — CEFAZOLIN SODIUM-DEXTROSE 2-4 GM/100ML-% IV SOLN
2.0000 g | INTRAVENOUS | Status: AC
  Administered 2018-03-06: 2 g via INTRAVENOUS

## 2018-03-06 MED ORDER — HEPARIN SODIUM (PORCINE) 1000 UNIT/ML IJ SOLN
INTRAMUSCULAR | Status: DC | PRN
  Administered 2018-03-06: 3 [IU] via INTRAVENOUS

## 2018-03-06 MED ORDER — FENTANYL CITRATE (PF) 250 MCG/5ML IJ SOLN
INTRAMUSCULAR | Status: DC | PRN
  Administered 2018-03-06: 50 ug via INTRAVENOUS

## 2018-03-06 MED ORDER — SODIUM CHLORIDE 0.9 % IV SOLN
INTRAVENOUS | Status: DC | PRN
  Administered 2018-03-06: 500 mL

## 2018-03-06 MED ORDER — FENTANYL CITRATE (PF) 100 MCG/2ML IJ SOLN
25.0000 ug | INTRAMUSCULAR | Status: DC | PRN
  Administered 2018-03-06 (×2): 25 ug via INTRAVENOUS
  Administered 2018-03-06: 50 ug via INTRAVENOUS

## 2018-03-06 MED ORDER — SODIUM CHLORIDE 0.9 % IV SOLN
INTRAVENOUS | Status: AC
  Filled 2018-03-06: qty 1.2

## 2018-03-06 SURGICAL SUPPLY — 74 items
ADH SKN CLS APL DERMABOND .7 (GAUZE/BANDAGES/DRESSINGS) ×2
ADH SKN CLS LQ APL DERMABOND (GAUZE/BANDAGES/DRESSINGS) ×2
BAG DECANTER FOR FLEXI CONT (MISCELLANEOUS) ×4 IMPLANT
BANDAGE ELASTIC 4 VELCRO ST LF (GAUZE/BANDAGES/DRESSINGS) ×3 IMPLANT
BANDAGE ESMARK 6X9 LF (GAUZE/BANDAGES/DRESSINGS) IMPLANT
BIOPATCH RED 1 DISK 7.0 (GAUZE/BANDAGES/DRESSINGS) ×3 IMPLANT
BIOPATCH RED 1IN DISK 7.0MM (GAUZE/BANDAGES/DRESSINGS) ×1
BNDG CMPR 9X6 STRL LF SNTH (GAUZE/BANDAGES/DRESSINGS)
BNDG ESMARK 6X9 LF (GAUZE/BANDAGES/DRESSINGS)
CANISTER SUCT 3000ML PPV (MISCELLANEOUS) ×4 IMPLANT
CATH PALINDROME RT-P 15FX19CM (CATHETERS) ×2 IMPLANT
CATH PALINDROME RT-P 15FX23CM (CATHETERS) IMPLANT
CATH PALINDROME RT-P 15FX28CM (CATHETERS) IMPLANT
CATH PALINDROME RT-P 15FX55CM (CATHETERS) IMPLANT
COVER PROBE W GEL 5X96 (DRAPES) ×4 IMPLANT
COVER SURGICAL LIGHT HANDLE (MISCELLANEOUS) ×4 IMPLANT
COVER WAND RF STERILE (DRAPES) ×4 IMPLANT
CUFF TOURNIQUET SINGLE 18IN (TOURNIQUET CUFF) IMPLANT
CUFF TOURNIQUET SINGLE 24IN (TOURNIQUET CUFF) IMPLANT
CUFF TOURNIQUET SINGLE 34IN LL (TOURNIQUET CUFF) IMPLANT
CUFF TOURNIQUET SINGLE 44IN (TOURNIQUET CUFF) IMPLANT
DERMABOND ADHESIVE PROPEN (GAUZE/BANDAGES/DRESSINGS) ×2
DERMABOND ADVANCED (GAUZE/BANDAGES/DRESSINGS) ×2
DERMABOND ADVANCED .7 DNX12 (GAUZE/BANDAGES/DRESSINGS) ×2 IMPLANT
DERMABOND ADVANCED .7 DNX6 (GAUZE/BANDAGES/DRESSINGS) IMPLANT
DRAIN CHANNEL 15F RND FF W/TCR (WOUND CARE) IMPLANT
DRAPE C-ARM 42X72 X-RAY (DRAPES) ×4 IMPLANT
DRAPE CHEST BREAST 15X10 FENES (DRAPES) ×4 IMPLANT
DRAPE ORTHO SPLIT 77X108 STRL (DRAPES) ×4
DRAPE SURG ORHT 6 SPLT 77X108 (DRAPES) IMPLANT
DRSG EMULSION OIL 3X3 NADH (GAUZE/BANDAGES/DRESSINGS) ×2 IMPLANT
ELECT REM PT RETURN 9FT ADLT (ELECTROSURGICAL) ×4
ELECTRODE REM PT RTRN 9FT ADLT (ELECTROSURGICAL) ×2 IMPLANT
EVACUATOR SILICONE 100CC (DRAIN) IMPLANT
GAUZE 4X4 16PLY RFD (DISPOSABLE) ×4 IMPLANT
GAUZE SPONGE 4X4 12PLY STRL (GAUZE/BANDAGES/DRESSINGS) ×3 IMPLANT
GLOVE BIO SURGEON STRL SZ7.5 (GLOVE) ×4 IMPLANT
GOWN STRL REUS W/ TWL LRG LVL3 (GOWN DISPOSABLE) ×2 IMPLANT
GOWN STRL REUS W/ TWL XL LVL3 (GOWN DISPOSABLE) ×6 IMPLANT
GOWN STRL REUS W/TWL LRG LVL3 (GOWN DISPOSABLE) ×4
GOWN STRL REUS W/TWL XL LVL3 (GOWN DISPOSABLE) ×12
KIT BASIN OR (CUSTOM PROCEDURE TRAY) ×4 IMPLANT
KIT TURNOVER KIT B (KITS) ×4 IMPLANT
NDL 18GX1X1/2 (RX/OR ONLY) (NEEDLE) ×2 IMPLANT
NDL HYPO 25GX1X1/2 BEV (NEEDLE) ×2 IMPLANT
NEEDLE 18GX1X1/2 (RX/OR ONLY) (NEEDLE) ×4 IMPLANT
NEEDLE HYPO 25GX1X1/2 BEV (NEEDLE) ×4 IMPLANT
NS IRRIG 1000ML POUR BTL (IV SOLUTION) ×8 IMPLANT
PACK CV ACCESS (CUSTOM PROCEDURE TRAY) ×2 IMPLANT
PACK GENERAL/GYN (CUSTOM PROCEDURE TRAY) IMPLANT
PACK PERIPHERAL VASCULAR (CUSTOM PROCEDURE TRAY) IMPLANT
PACK SURGICAL SETUP 50X90 (CUSTOM PROCEDURE TRAY) ×4 IMPLANT
PACK UNIVERSAL I (CUSTOM PROCEDURE TRAY) IMPLANT
PAD ARMBOARD 7.5X6 YLW CONV (MISCELLANEOUS) ×8 IMPLANT
SOAP 2 % CHG 4 OZ (WOUND CARE) ×4 IMPLANT
STAPLER VISISTAT 35W (STAPLE) ×2 IMPLANT
SUT ETHILON 3 0 PS 1 (SUTURE) ×4 IMPLANT
SUT MNCRL AB 4-0 PS2 18 (SUTURE) ×4 IMPLANT
SUT PROLENE 5 0 C 1 24 (SUTURE) ×2 IMPLANT
SUT PROLENE 6 0 BV (SUTURE) IMPLANT
SUT VIC AB 2-0 CT1 27 (SUTURE)
SUT VIC AB 2-0 CT1 TAPERPNT 27 (SUTURE) IMPLANT
SUT VIC AB 3-0 SH 27 (SUTURE)
SUT VIC AB 3-0 SH 27X BRD (SUTURE) IMPLANT
SYR 10ML LL (SYRINGE) ×4 IMPLANT
SYR 20CC LL (SYRINGE) ×8 IMPLANT
SYR 5ML LL (SYRINGE) ×4 IMPLANT
SYR CONTROL 10ML LL (SYRINGE) ×4 IMPLANT
TOWEL GREEN STERILE (TOWEL DISPOSABLE) ×4 IMPLANT
TOWEL GREEN STERILE FF (TOWEL DISPOSABLE) ×4 IMPLANT
TOWEL OR 17X26 4PK STRL BLUE (TOWEL DISPOSABLE) ×4 IMPLANT
TRAY FOLEY MTR SLVR 16FR STAT (SET/KITS/TRAYS/PACK) IMPLANT
UNDERPAD 30X30 (UNDERPADS AND DIAPERS) ×4 IMPLANT
WATER STERILE IRR 1000ML POUR (IV SOLUTION) ×4 IMPLANT

## 2018-03-06 NOTE — Anesthesia Procedure Notes (Signed)
Procedure Name: LMA Insertion Date/Time: 03/06/2018 11:15 AM Performed by: Bryson Corona, CRNA Pre-anesthesia Checklist: Patient identified, Emergency Drugs available, Suction available and Patient being monitored Patient Re-evaluated:Patient Re-evaluated prior to induction Oxygen Delivery Method: Circle System Utilized Preoxygenation: Pre-oxygenation with 100% oxygen Induction Type: IV induction Ventilation: Mask ventilation without difficulty LMA: LMA inserted LMA Size: 4.0 Number of attempts: 1 Airway Equipment and Method: Bite block Placement Confirmation: positive ETCO2 Tube secured with: Tape Dental Injury: Teeth and Oropharynx as per pre-operative assessment

## 2018-03-06 NOTE — H&P (Signed)
   History and Physical Update  The patient was interviewed and re-examined.  The patient's previous History and Physical has been reviewed and is unchanged from recent office visit. Plan for operative evacuation of right arm hematoma and tdc placement.  Brandon C. Donzetta Matters, MD [facility] [place] Office: [phone] Pager: [phone]  03/06/2018, 7:46 AM

## 2018-03-06 NOTE — Anesthesia Preprocedure Evaluation (Signed)
Anesthesia Evaluation  Patient identified by MRN, date of birth, ID band Patient awake    Reviewed: Allergy & Precautions, NPO status , Patient's Chart, lab work & pertinent test results  Airway Mallampati: II  TM Distance: >3 FB Neck ROM: Full    Dental  (+) Teeth Intact, Dental Advisory Given   Pulmonary Current Smoker,    breath sounds clear to auscultation       Cardiovascular hypertension,  Rhythm:Regular Rate:Normal     Neuro/Psych    GI/Hepatic   Endo/Other    Renal/GU      Musculoskeletal   Abdominal   Peds  Hematology   Anesthesia Other Findings   Reproductive/Obstetrics                             Anesthesia Physical Anesthesia Plan  ASA: III  Anesthesia Plan: General   Post-op Pain Management:    Induction: Intravenous  PONV Risk Score and Plan: Ondansetron and Dexamethasone  Airway Management Planned: Oral ETT  Additional Equipment:   Intra-op Plan:   Post-operative Plan:   Informed Consent: I have reviewed the patients History and Physical, chart, labs and discussed the procedure including the risks, benefits and alternatives for the proposed anesthesia with the patient or authorized representative who has indicated his/her understanding and acceptance.   Dental advisory given  Plan Discussed with: CRNA and Anesthesiologist  Anesthesia Plan Comments:         Anesthesia Quick Evaluation

## 2018-03-06 NOTE — Discharge Instructions (Signed)
[facility]  Discharge Instructions  AV Fistula or Graft Surgery for Dialysis Access  Please refer to the following instructions for your post-procedure care. Your surgeon or physician assistant will discuss any changes with you.  Activity  You may drive the day following your surgery, if you are comfortable and no longer taking prescription pain medication. Resume full activity as the soreness in your incision resolves.  Bathing/Showering  You may shower after you go home. Keep your incision dry for 48 hours. Do not soak in a bathtub, hot tub, or swim until the incision heals completely. You may not shower if you have a hemodialysis catheter.  Incision Care  Clean your incision with mild soap and water after 48 hours. Pat the area dry with a clean towel. You do not need a bandage unless otherwise instructed. Do not apply any ointments or creams to your incision. You may have skin glue on your incision. Do not peel it off. It will come off on its own in about one week. Your arm may swell a bit after surgery. To reduce swelling use pillows to elevate your arm so it is above your heart. Your doctor will tell you if you need to lightly wrap your arm with an ACE bandage. Leave the ace bandage in place until after your dialysis session on Friday then remove when you get home.  May wash arm with soap and water.  Use neosporin on area where the blister was on your arm and then you may use a band-aid over the area with neosporin.  May cover the staples with gauze or you may leave open to air after the ace wrap is removed.   Diet  Resume your normal diet. There are not special food restrictions following this procedure. In order to heal from your surgery, it is CRITICAL to get adequate nutrition. Your body requires vitamins, minerals, and protein. Vegetables are the best source of vitamins and minerals. Vegetables also provide the perfect balance of protein.  Processed food has little nutritional value, so try to avoid this.  Medications  Resume taking all of your medications. If your incision is causing pain, you may take over-the counter pain relievers such as acetaminophen (Tylenol). If you were prescribed a stronger pain medication, please be aware these medications can cause nausea and constipation. Prevent nausea by taking the medication with a snack or meal. Avoid constipation by drinking plenty of fluids and eating foods with high amount of fiber, such as fruits, vegetables, and grains.  Do not take Tylenol if you are taking prescription pain medications.  Follow up Your surgeon may want to see you in the office following your access surgery. If so, this will be arranged at the time of your surgery.  Please call us immediately for any of the following conditions:  Increased pain, redness, drainage (pus) from your incision site Fever of 101 degrees or higher Severe or worsening pain at your incision site Hand pain or numbness.  Reduce your risk of vascular disease:  Stop smoking. If you would like help, call [facility] at 1-800-QUIT-NOW 337-334-6301) or [place] at [place] your cholesterol Maintain a desired weight Control your diabetes Keep your blood pressure down  Dialysis  It will take several weeks to several months for your new dialysis access to be ready for use. Your surgeon will determine when it is okay to use it. Your nephrologist will continue to direct your dialysis. You can continue to  use your Permcath until your new access is ready for use.   03/06/2018 [name] [id number] 1960-05-22  Surgeon(s): Waynetta Sandy, MD  Procedure(s): EVACUATION HEMATOMA RIGHT UPPER ARM INSERTION OF DIALYSIS CATHETER   If you have any questions, please call the office at [phone].

## 2018-03-06 NOTE — Op Note (Signed)
    Patient name: [name] MRN: [id number] DOB: 11/21/1960 Sex: female  03/06/2018 Pre-operative Diagnosis: End-stage renal disease, right upper extremity hematoma Post-operative diagnosis:  Same Surgeon:  Eda Paschal. Donzetta Matters, MD Assistant: Leontine Locket, PA Procedure Performed: 1.  Evacuation of right arm hematoma 2.  Ultrasound-guided placement of 19 cm left IJ tunneled dialysis catheter  Indications: 57 year old female with end-stage renal disease.  She was previously dialyzing via a right arm fistula that has now been revised with interposition Artegraft 2 occasions.  She presented to the office today prior to this with extensive hematoma in her right upper extremity.  She is now indicated for hematoma evacuation possible catheter placement.  Findings: There is significant amount of hematoma that was evacuated.  There is one area of bleeding of the proximal anastomosis that was repaired with interrupted 5-0 Prolene suture figure-of-eight configuration.  The loss of catheter was placed terminating at the SVC atrial junction.   Procedure:  The patient was identified in the holding area and taken to the operating room where she is placed supine the operative table and general anesthesia was induced.  She was sterilely prepped and draped in the right upper extremity bilateral neck and chest areas and a timeout was called.  Antibiotics were administered.  We began with opening her incision near her antecubital we encountered significant amounts of hematoma.  This was thoroughly evacuated and the wound was irrigated with 1 L of saline.  We did identify one area of bleeding from the posterior aspect of the proximal anastomosis and this was repaired with 5-0 Prolene suture in a figure-of-eight configuration.  We did not identify any further bleeding.  We then closed subcutaneous tissue with interrupted 3-0 Vicryl suture in place staples at the skin.  A sterile dressing was placed in the wound was wrapped  with Ace wrap.  We then turned our attention to the left neck where the IJ was noted to be patent and compressible.  This was cannulated with 18-gauge needle and a wire was placed centrally with fluoroscopic guidance.  A counterincision was made and a 19 cm catheter was tunneled and assembled.  The wire tract was serially dilated and introducer sheath was placed with fluoroscopic guidance.  Catheter was then placed to the SVC atrial junction.  It was flushed with heparinized saline.  It was affixed to the skin with 3-0 nylon suture and the neck incision was closed with 4-0 Monocryl and Dermabond was placed to both sides.  Sterile dressing was placed.  Catheter was locked with 1.5 cc of concentrated heparin in both ports.  She was allowed away from anesthesia having tolerated procedure well without immediate complication.  All counts were correct at completion.    EBL 100 cc   Phoenyx Paulsen C. Donzetta Matters, MD [facility] [place] Office: [phone] Pager: [phone]

## 2018-03-06 NOTE — Transfer of Care (Signed)
Immediate Anesthesia Transfer of Care Note  Patient: [name]  Procedure(s) Performed: EVACUATION HEMATOMA RIGHT UPPER ARM (Right Arm Upper) INSERTION OF DIALYSIS CATHETER, right internal jugular (Right Chest)  Patient Location: PACU  Anesthesia Type:General  Level of Consciousness: drowsy and patient cooperative  Airway & Oxygen Therapy: Patient Spontanous Breathing and Patient connected to nasal cannula oxygen  Post-op Assessment: Report given to RN and Post -op Vital signs reviewed and stable  Post vital signs: Reviewed and stable  Last Vitals:  Vitals Value Taken Time  BP 158/81 03/06/2018 12:34 PM  Temp    Pulse    Resp 18 03/06/2018 12:35 PM  SpO2    Vitals shown include unvalidated device data.  Last Pain:  Vitals:   03/06/18 0738  TempSrc:   PainSc: 7       Patients Stated Pain Goal: 3 (51/07/12 5247)  Complications: No apparent anesthesia complications

## 2018-03-06 NOTE — Progress Notes (Signed)
Anesthesia made aware of hemoglobin 7.1. Stated he would come evaluate her in person.

## 2018-03-06 NOTE — Anesthesia Postprocedure Evaluation (Signed)
Anesthesia Post Note  Patient: [name]  Procedure(s) Performed: EVACUATION HEMATOMA RIGHT UPPER ARM (Right Arm Upper) INSERTION OF DIALYSIS CATHETER, right internal jugular (Right Chest)     Patient location during evaluation: PACU Anesthesia Type: General Level of consciousness: awake and alert Pain management: pain level controlled Vital Signs Assessment: post-procedure vital signs reviewed and stable Respiratory status: spontaneous breathing, nonlabored ventilation, respiratory function stable and patient connected to nasal cannula oxygen Cardiovascular status: blood pressure returned to baseline and stable Postop Assessment: no apparent nausea or vomiting Anesthetic complications: no    Last Vitals:  Vitals:   03/06/18 1404 03/06/18 1430  BP: (!) 149/83 (!) 157/80  Pulse:    Resp: (!) 9   Temp:    SpO2:  100%    Last Pain:  Vitals:   03/06/18 1430  TempSrc:   PainSc: 2                  Lillianah Swartzentruber COKER

## 2018-03-07 ENCOUNTER — Telehealth: Payer: Self-pay | Admitting: Vascular Surgery

## 2018-03-07 ENCOUNTER — Encounter (HOSPITAL_COMMUNITY): Payer: Self-pay | Admitting: Vascular Surgery

## 2018-03-07 DIAGNOSIS — N186 End stage renal disease: Secondary | ICD-10-CM | POA: Diagnosis not present

## 2018-03-07 DIAGNOSIS — N2581 Secondary hyperparathyroidism of renal origin: Secondary | ICD-10-CM | POA: Diagnosis not present

## 2018-03-07 DIAGNOSIS — D509 Iron deficiency anemia, unspecified: Secondary | ICD-10-CM | POA: Diagnosis not present

## 2018-03-07 DIAGNOSIS — D631 Anemia in chronic kidney disease: Secondary | ICD-10-CM | POA: Diagnosis not present

## 2018-03-07 DIAGNOSIS — M3210 Systemic lupus erythematosus, organ or system involvement unspecified: Secondary | ICD-10-CM | POA: Diagnosis not present

## 2018-03-07 NOTE — Telephone Encounter (Signed)
sch appt spk to pt 03/21/18 3pm staple removal MD

## 2018-03-07 NOTE — Telephone Encounter (Signed)
-----   Message from Mena Goes, RN sent at 03/06/2018 12:54 PM EDT ----- Regarding: 2 weeks postop staple removal also   ----- Message ----- From: Gabriel Earing, PA-C Sent: 03/06/2018  12:11 PM EDT To: [facility] Admin Pool, [facility] Charge Pool  S/p evacuation of hematoma and TDC placement 03/06/18.  Please have her see Dr. Donzetta Matters (MD visit) in 2 weeks.  (she also has staples that will need to come out).   Thanks

## 2018-03-10 DIAGNOSIS — N2581 Secondary hyperparathyroidism of renal origin: Secondary | ICD-10-CM | POA: Diagnosis not present

## 2018-03-10 DIAGNOSIS — D631 Anemia in chronic kidney disease: Secondary | ICD-10-CM | POA: Diagnosis not present

## 2018-03-10 DIAGNOSIS — D509 Iron deficiency anemia, unspecified: Secondary | ICD-10-CM | POA: Diagnosis not present

## 2018-03-10 DIAGNOSIS — M3210 Systemic lupus erythematosus, organ or system involvement unspecified: Secondary | ICD-10-CM | POA: Diagnosis not present

## 2018-03-10 DIAGNOSIS — N186 End stage renal disease: Secondary | ICD-10-CM | POA: Diagnosis not present

## 2018-03-12 DIAGNOSIS — D509 Iron deficiency anemia, unspecified: Secondary | ICD-10-CM | POA: Diagnosis not present

## 2018-03-12 DIAGNOSIS — N2581 Secondary hyperparathyroidism of renal origin: Secondary | ICD-10-CM | POA: Diagnosis not present

## 2018-03-12 DIAGNOSIS — D631 Anemia in chronic kidney disease: Secondary | ICD-10-CM | POA: Diagnosis not present

## 2018-03-12 DIAGNOSIS — N186 End stage renal disease: Secondary | ICD-10-CM | POA: Diagnosis not present

## 2018-03-12 DIAGNOSIS — M3210 Systemic lupus erythematosus, organ or system involvement unspecified: Secondary | ICD-10-CM | POA: Diagnosis not present

## 2018-03-14 ENCOUNTER — Encounter (HOSPITAL_COMMUNITY): Payer: Self-pay | Admitting: *Deleted

## 2018-03-14 ENCOUNTER — Observation Stay (HOSPITAL_COMMUNITY)
Admission: EM | Admit: 2018-03-14 | Discharge: 2018-03-15 | Disposition: A | Discharge status: Home / Self Care | Payer: Medicare Other | Attending: Family Medicine | Admitting: Family Medicine

## 2018-03-14 ENCOUNTER — Other Ambulatory Visit: Payer: Self-pay

## 2018-03-14 DIAGNOSIS — D631 Anemia in chronic kidney disease: Secondary | ICD-10-CM | POA: Insufficient documentation

## 2018-03-14 DIAGNOSIS — E876 Hypokalemia: Secondary | ICD-10-CM | POA: Insufficient documentation

## 2018-03-14 DIAGNOSIS — N186 End stage renal disease: Secondary | ICD-10-CM | POA: Diagnosis not present

## 2018-03-14 DIAGNOSIS — F17210 Nicotine dependence, cigarettes, uncomplicated: Secondary | ICD-10-CM | POA: Diagnosis not present

## 2018-03-14 DIAGNOSIS — Z79899 Other long term (current) drug therapy: Secondary | ICD-10-CM | POA: Insufficient documentation

## 2018-03-14 DIAGNOSIS — I120 Hypertensive chronic kidney disease with stage 5 chronic kidney disease or end stage renal disease: Principal | ICD-10-CM | POA: Insufficient documentation

## 2018-03-14 DIAGNOSIS — I110 Hypertensive heart disease with heart failure: Secondary | ICD-10-CM

## 2018-03-14 DIAGNOSIS — Z7982 Long term (current) use of aspirin: Secondary | ICD-10-CM | POA: Insufficient documentation

## 2018-03-14 DIAGNOSIS — Z8673 Personal history of transient ischemic attack (TIA), and cerebral infarction without residual deficits: Secondary | ICD-10-CM | POA: Diagnosis not present

## 2018-03-14 DIAGNOSIS — K219 Gastro-esophageal reflux disease without esophagitis: Secondary | ICD-10-CM | POA: Insufficient documentation

## 2018-03-14 DIAGNOSIS — N2581 Secondary hyperparathyroidism of renal origin: Secondary | ICD-10-CM | POA: Diagnosis not present

## 2018-03-14 DIAGNOSIS — Z992 Dependence on renal dialysis: Secondary | ICD-10-CM | POA: Diagnosis not present

## 2018-03-14 DIAGNOSIS — D649 Anemia, unspecified: Secondary | ICD-10-CM

## 2018-03-14 DIAGNOSIS — G8929 Other chronic pain: Secondary | ICD-10-CM | POA: Insufficient documentation

## 2018-03-14 DIAGNOSIS — D509 Iron deficiency anemia, unspecified: Secondary | ICD-10-CM | POA: Diagnosis not present

## 2018-03-14 DIAGNOSIS — R531 Weakness: Secondary | ICD-10-CM | POA: Diagnosis present

## 2018-03-14 DIAGNOSIS — I5032 Chronic diastolic (congestive) heart failure: Secondary | ICD-10-CM

## 2018-03-14 DIAGNOSIS — I10 Essential (primary) hypertension: Secondary | ICD-10-CM | POA: Diagnosis present

## 2018-03-14 DIAGNOSIS — M3210 Systemic lupus erythematosus, organ or system involvement unspecified: Secondary | ICD-10-CM | POA: Diagnosis not present

## 2018-03-14 LAB — CBC WITH DIFFERENTIAL/PLATELET
Abs Immature Granulocytes: 0.04 10*3/uL (ref 0.00–0.07)
BASOS ABS: 0.1 10*3/uL (ref 0.0–0.1)
BASOS PCT: 1 %
EOS ABS: 0.1 10*3/uL (ref 0.0–0.5)
Eosinophils Relative: 1 %
HCT: 24.4 % — ABNORMAL LOW (ref 36.0–46.0)
Hemoglobin: 7.2 g/dL — ABNORMAL LOW (ref 12.0–15.0)
IMMATURE GRANULOCYTES: 1 %
Lymphocytes Relative: 16 %
Lymphs Abs: 0.7 10*3/uL (ref 0.7–4.0)
MCH: 29.1 pg (ref 26.0–34.0)
MCHC: 29.5 g/dL — ABNORMAL LOW (ref 30.0–36.0)
MCV: 98.8 fL (ref 80.0–100.0)
Monocytes Absolute: 0.4 10*3/uL (ref 0.1–1.0)
Monocytes Relative: 8 %
NEUTROS ABS: 3.3 10*3/uL (ref 1.7–7.7)
NRBC: 0 % (ref 0.0–0.2)
Neutrophils Relative %: 73 %
Platelets: 260 10*3/uL (ref 150–400)
RBC: 2.47 MIL/uL — AB (ref 3.87–5.11)
RDW: 18.1 % — AB (ref 11.5–15.5)
WBC: 4.5 10*3/uL (ref 4.0–10.5)

## 2018-03-14 LAB — PREPARE RBC (CROSSMATCH)

## 2018-03-14 LAB — COMPREHENSIVE METABOLIC PANEL
ALT: 7 U/L (ref 0–44)
AST: 21 U/L (ref 15–41)
Albumin: 3.1 g/dL — ABNORMAL LOW (ref 3.5–5.0)
Alkaline Phosphatase: 95 U/L (ref 38–126)
Anion gap: 9 (ref 5–15)
BUN: 13 mg/dL (ref 6–20)
CALCIUM: 8.6 mg/dL — AB (ref 8.9–10.3)
CO2: 32 mmol/L (ref 22–32)
Chloride: 96 mmol/L — ABNORMAL LOW (ref 98–111)
Creatinine, Ser: 2.77 mg/dL — ABNORMAL HIGH (ref 0.44–1.00)
GFR calc Af Amer: 21 mL/min — ABNORMAL LOW (ref >60)
GFR, EST NON AFRICAN AMERICAN: 18 mL/min — AB (ref >60)
Glucose, Bld: 123 mg/dL — ABNORMAL HIGH (ref 70–99)
Potassium: 3.4 mmol/L — ABNORMAL LOW (ref 3.5–5.1)
Sodium: 137 mmol/L (ref 135–145)
Total Bilirubin: 0.6 mg/dL (ref 0.3–1.2)
Total Protein: 6.9 g/dL (ref 6.5–8.1)

## 2018-03-14 MED ORDER — POTASSIUM CHLORIDE CRYS ER 20 MEQ PO TBCR
20.0000 meq | EXTENDED_RELEASE_TABLET | Freq: Once | ORAL | Status: AC
  Administered 2018-03-14: 20 meq via ORAL
  Filled 2018-03-14: qty 1

## 2018-03-14 MED ORDER — ASPIRIN EC 81 MG PO TBEC
81.0000 mg | DELAYED_RELEASE_TABLET | Freq: Every day | ORAL | Status: DC
  Administered 2018-03-15: 81 mg via ORAL
  Filled 2018-03-14: qty 1

## 2018-03-14 MED ORDER — LANTHANUM CARBONATE 500 MG PO CHEW
1000.0000 mg | CHEWABLE_TABLET | ORAL | Status: DC | PRN
  Filled 2018-03-14: qty 2

## 2018-03-14 MED ORDER — OXYCODONE-ACETAMINOPHEN 7.5-325 MG PO TABS: 1.0000 | ORAL_TABLET | Freq: Four times a day (QID) | ORAL | Status: DC | PRN

## 2018-03-14 MED ORDER — PANTOPRAZOLE SODIUM 40 MG PO TBEC
40.0000 mg | DELAYED_RELEASE_TABLET | Freq: Two times a day (BID) | ORAL | Status: DC
Start: 2018-03-14 — End: 2018-03-15
  Administered 2018-03-15 (×2): 40 mg via ORAL
  Filled 2018-03-14 (×2): qty 1

## 2018-03-14 MED ORDER — AMLODIPINE BESYLATE 10 MG PO TABS
10.0000 mg | ORAL_TABLET | Freq: Every day | ORAL | Status: DC
  Administered 2018-03-15: 10 mg via ORAL
  Filled 2018-03-14: qty 2

## 2018-03-14 MED ORDER — RENA-VITE PO TABS
1.0000 | ORAL_TABLET | Freq: Every day | ORAL | Status: DC
  Administered 2018-03-15: 1 via ORAL
  Filled 2018-03-14: qty 1

## 2018-03-14 MED ORDER — HEPARIN SODIUM (PORCINE) 5000 UNIT/ML IJ SOLN: 5000.0000 [IU] | Freq: Three times a day (TID) | INTRAMUSCULAR | Status: DC

## 2018-03-14 MED ORDER — LABETALOL HCL 300 MG PO TABS
600.0000 mg | ORAL_TABLET | Freq: Three times a day (TID) | ORAL | Status: DC
  Administered 2018-03-15 (×2): 600 mg via ORAL
  Filled 2018-03-14: qty 3
  Filled 2018-03-14 (×4): qty 2

## 2018-03-14 MED ORDER — SODIUM CHLORIDE 0.9 % IV SOLN
10.0000 mL/h | Freq: Once | INTRAVENOUS | Status: AC
  Administered 2018-03-14: 10 mL/h via INTRAVENOUS

## 2018-03-14 MED ORDER — ACETAMINOPHEN 500 MG PO TABS
1000.0000 mg | ORAL_TABLET | Freq: Three times a day (TID) | ORAL | Status: DC | PRN
  Administered 2018-03-15: 1000 mg via ORAL
  Filled 2018-03-14: qty 2

## 2018-03-14 MED ORDER — LANTHANUM CARBONATE 500 MG PO CHEW
2000.0000 mg | CHEWABLE_TABLET | Freq: Three times a day (TID) | ORAL | Status: DC
  Administered 2018-03-15: 2000 mg via ORAL
  Filled 2018-03-14 (×3): qty 4

## 2018-03-14 MED ORDER — CLONIDINE HCL 0.2 MG PO TABS
0.2000 mg | ORAL_TABLET | Freq: Two times a day (BID) | ORAL | Status: DC
  Administered 2018-03-15 (×2): 0.2 mg via ORAL
  Filled 2018-03-14 (×2): qty 1

## 2018-03-14 NOTE — ED Notes (Signed)
DIALYSIS MWF, left dialysis and came here, received full treatment today

## 2018-03-14 NOTE — ED Provider Notes (Signed)
[facility] EMERGENCY DEPARTMENT Provider Note   CSN: [id number] Arrival date & time: 03/14/18  1339     History   Chief Complaint Chief Complaint  Patient presents with  . Abnormal Lab    HPI [name] is a 57 y.o. female.  The history is provided by the patient and medical records. No language interpreter was used.    [name] is a 57 y.o. female  with a PMH of ESRD on dialysis who presents to the Emergency Department complaining of weakness and shortness of breath with exertion.  Patient states that she has been struggling with anemia thought to be due to her chronic kidney disease.  Her nephrologist has been managing this.  She a hemoglobin of 6.6 on Wednesday.  She was scheduled for a blood transfusion on Monday.  She went to dialysis today.  Per patient, she received her full treatment.  She states that the nurses at the dialysis center were concerned with how weak and short of breath she was, therefore recommended she come to the emergency department.  Patient states that she did not feel like she could wait until Monday to have her transfusion.  Denies blood in the stool or hematemesis.  No abdominal pain, nausea or vomiting.  She is not on any anticoagulation.  She does report having to have transfusions in the past.  Past Medical History:  Diagnosis Date  . Anemia   . Aortic stenosis 09/25/2016   Echo 07/24/16: Mod conc LVH, EF 60-65, no RWMA, Gr 2 DDd, bicuspid aortic valve, mild to mod AS (mean 18, peak 38), MAC, mod mitral stenosis (mean 9, peak 19), mild to mod MR, severe LAE, normal RVSF, mild RAE, mild TR  . Arthritis    "joints" (10/23/2017)  . Blood transfusion '08   [facility]; "low HgB" (10/23/2017)  . Chronic diastolic CHF (congestive heart failure) ([place])   . Dysfunctional uterine bleeding 12/19/2010  . ESRD (end stage renal disease) on dialysis [facility])    "MWF; [name]." (10/23/2017)  . GERD (gastroesophageal reflux disease)   . Headache   . Heart  murmur   . Hemodialysis patient [facility])    right extremity port  . History of hiatal hernia   . Hx of cardiovascular stress test    Lexiscan Myoview 4/16:  Normal stress nuclear study, EF 59%  . Hypertension   . Lupus ([place])    "? kind" (10/23/2017)  . Mitral stenosis    Echo 4/16:  EF 55-60%, no RWMA, Gr 1 DD, mod MS (mean 9 mmHg), mod LAE, mild RAE, PASP 65, mod to severe TR, trivial eff // Echo 6/19:  Mild LVH, EF 55-60, no RWMA, Gr 2 DD, mild to mod AS (Mean 20), severe MS (mean 17), massive LAE, PASP 48, trivial effusion   . Peptic ulcer disease   . Pneumonia 10/21/2017  . PONV (postoperative nausea and vomiting)   . Stroke [facility])    per patient "they said i had a small stroke but i couldnt even tell"    Patient Active Problem List   Diagnosis Date Noted  . Symptomatic anemia 03/14/2018  . Hypokalemia 03/14/2018  . Chronic pain 03/14/2018  . GERD (gastroesophageal reflux disease) 03/14/2018  . ESRD needing dialysis ([place])   . Volume overload 10/21/2017  . HCAP (healthcare-associated pneumonia) 10/21/2017  . Acute respiratory failure with hypoxia ([place]) 10/21/2017  . Fluid overload 01/26/2017  . HTN (hypertension) 01/26/2017  . Rotator cuff tear arthropathy, right 10/09/2016  .  Aortic stenosis 09/25/2016  . History of sepsis 05/01/2015  . Hypoxia 05/01/2015  . Mitral stenosis 08/30/2014  . End-stage renal disease on hemodialysis ([place]) 07/25/2014  . History of pulmonary edema 07/25/2014  . Lupus (systemic lupus erythematosus) ([place]) 07/25/2014  . DUB (dysfunctional uterine bleeding) 07/25/2014  . Anemia 07/25/2014  . Peptic ulcer disease 07/25/2014  . History of stroke 07/25/2014  . Hypertensive heart disease with CHF (congestive heart failure) ([place]) 12/19/2010  . Dialysis care 12/19/2010    Past Surgical History:  Procedure Laterality Date  . AV FISTULA PLACEMENT Right 02/25/2018   Procedure: INSERTION OF 78m x 16cm ARTEGRAFT;  Surgeon: CWaynetta Sandy MD;   [facility]  Service: Vascular;  Laterality: Right;  . COLONOSCOPY W/ BIOPSIES AND POLYPECTOMY    . DIALYSIS FISTULA CREATION  2007  . ENDOMETRIAL ABLATION    . ESOPHAGOGASTRODUODENOSCOPY N/A 07/31/2014   Procedure: ESOPHAGOGASTRODUODENOSCOPY (EGD);  Surgeon: MClarene Essex MD;  [facility]  Service: Endoscopy;  Laterality: N/A;  . FISTULOGRAM Right 02/25/2018   Procedure: FISTULOGRAM ARM;  Surgeon: CWaynetta Sandy MD;  [place] [facility]  Service: Vascular;  Laterality: Right;  . HEMATOMA EVACUATION Right 03/06/2018   Procedure: EVACUATION HEMATOMA RIGHT UPPER ARM;  Surgeon: CWaynetta Sandy MD;  [place] [facility]  Service: Vascular;  Laterality: Right;  . INSERTION OF ARTERIOVENOUS (AV) ARTEGRAFT ARM Right 09/26/2017   Procedure: INSERTION OF ARTERIOVENOUS (AV) ARTEGRAFT INTO RIGHT ARM;  Surgeon: CWaynetta Sandy MD;  [place] [facility]  Service: Vascular;  Laterality: Right;  . INSERTION OF DIALYSIS CATHETER Right 03/06/2018   Procedure: INSERTION OF DIALYSIS CATHETER, right internal jugular;  Surgeon: CWaynetta Sandy MD;  [place] [facility]  Service: Vascular;  Laterality: Right;  . REVISON OF ARTERIOVENOUS FISTULA Right 09/26/2017   Procedure: REVISION OF ARTERIOVENOUS FISTULA RIGHT ARM WITH ARTEGRAFT;  Surgeon: CWaynetta Sandy MD;  [place] [facility]  Service: Vascular;  Laterality: Right;  . REVISON OF ARTERIOVENOUS FISTULA Right 02/25/2018   Procedure: REVISION OF ARTERIOVENOUS FISTULA;  Surgeon: CWaynetta Sandy MD;  [place] [facility]  Service: Vascular;  Laterality: Right;  . SHOULDER OPEN ROTATOR CUFF REPAIR Right 10/09/2016   Procedure: ROTATOR CUFF REPAIR SHOULDER OPEN partial acrominectomy and extensive synovectomy;  Surgeon: GLatanya [name] MD;  [facility];  Service: Orthopedics;  Laterality: Right;  RNFA  . TEE WITHOUT CARDIOVERSION N/A 01/02/2018   Procedure: TRANSESOPHAGEAL ECHOCARDIOGRAM (TEE) Bubble Study;   Surgeon: MLarey Dresser MD;  [facility];  Service: Cardiovascular;  Laterality: N/A;     OB History   None      Home Medications    Prior to Admission medications   Medication Sig Start Date End Date Taking? Authorizing Provider  acetaminophen (TYLENOL) 500 MG tablet Take 1,000 mg by mouth as needed for mild pain.    [provider]  amLODipine (NORVASC) 10 MG tablet Take 10 mg by mouth at bedtime.     [provider]  aspirin EC 81 MG tablet Take 81 mg by mouth at bedtime.    [provider]  calcitRIOL (ROCALTROL) 0.25 MCG capsule Take 7 capsules (1.75 mcg total) by mouth every other day. 10/27/17   DPhillips Grout MD  cloNIDine (CATAPRES) 0.2 MG tablet TAKE 1 TABLET(0.2 MG) BY MOUTH TWICE DAILY Patient taking differently: Take 0.2 mg by mouth 2 (two) times daily.  11/04/17   RFay Records MD  labetalol (NORMODYNE) 300 MG tablet Take 2 tablets (600 mg  total) by mouth 3 (three) times daily. 10/25/17   Phillips Grout, MD  lanthanum (FOSRENOL) 1000 MG chewable tablet Chew 2 tablets (2,000 mg total) by mouth 3 (three) times daily with meals. Patient taking differently: Chew 1,000-2,000 mg by mouth 3 (three) times daily with meals.  05/05/15   Eugenie Filler, MD  multivitamin (RENA-VIT) TABS tablet Take 1 tablet by mouth daily.    [provider]  ondansetron (ZOFRAN) 4 MG tablet Take 4 mg by mouth every 6 (six) hours as needed for nausea or vomiting.    [provider]  oxyCODONE-acetaminophen (PERCOCET) 7.5-325 MG tablet Take 1 tablet by mouth every 6 (six) hours as needed for severe pain. 03/06/18   Rhyne, Hulen Shouts, PA-C  pantoprazole (PROTONIX) 40 MG tablet Take 40 mg by mouth 2 (two) times daily.     [provider]    Family History Family History  Problem Relation Age of Onset  . Liver cancer Maternal Grandmother   . Lymphoma Maternal Aunt   . Hypertension Mother   . Renal Disease Father   . Hypertension  Father   . Heart attack Neg Hx     Social History Social History   Tobacco Use  . Smoking status: Current Some Day Smoker    Packs/day: 0.10    Years: 10.00    Pack years: 1.00    Types: Cigarettes    Start date: 10/21/2017  . Smokeless tobacco: Never Used  . Tobacco comment: 3 cigs a week  Substance Use Topics  . Alcohol use: Yes    Alcohol/week: 1.0 standard drinks    Types: 1 Cans of beer per week    Comment: occasional beer  . Drug use: Yes    Types: Marijuana, Cocaine    Comment: 03/04/18- marijuana- this weekend     Allergies   Patient has no known allergies.   Review of Systems Review of Systems  Respiratory: Positive for shortness of breath.   Neurological: Positive for weakness.  All other systems reviewed and are negative.    Physical Exam Updated Vital Signs BP (!) 169/86   Pulse 85   Temp 98.9 F (37.2 C) (Oral)   Resp 14   LMP 12/05/2010 (LMP Unknown)   SpO2 94%   Physical Exam  Constitutional: She is oriented to person, place, and time. She appears well-developed and well-nourished. No distress.  HENT:  Head: Normocephalic and atraumatic.  Cardiovascular: Normal rate, regular rhythm and normal heart sounds.  No murmur heard. Pulmonary/Chest: Effort normal and breath sounds normal. No respiratory distress. She has no wheezes. She has no rales.  Abdominal: Soft. She exhibits no distension. There is no tenderness.  Musculoskeletal: She exhibits no edema.  Neurological: She is alert and oriented to person, place, and time.  Skin: Skin is warm and dry.  Nursing note and vitals reviewed.    ED Treatments / Results  Labs (all labs ordered are listed, but only abnormal results are displayed) Labs Reviewed  CBC WITH DIFFERENTIAL/PLATELET - Abnormal; Notable for the following components:      Result Value   RBC 2.47 (*)    Hemoglobin 7.2 (*)    HCT 24.4 (*)    MCHC 29.5 (*)    RDW 18.1 (*)    All other components within normal limits    COMPREHENSIVE METABOLIC PANEL - Abnormal; Notable for the following components:   Potassium 3.4 (*)    Chloride 96 (*)    Glucose, Bld 123 (*)  Creatinine, Ser 2.77 (*)    Calcium 8.6 (*)    Albumin 3.1 (*)    GFR calc non Af Amer 18 (*)    GFR calc Af Amer 21 (*)    All other components within normal limits  TYPE AND SCREEN  PREPARE RBC (CROSSMATCH)    EKG None  Radiology No results found.  Procedures Procedures (including critical care time)  CRITICAL CARE Performed by: Ozella Almond Andi Mahaffy   Total critical care time: 35 minutes  Critical care time was exclusive of separately billable procedures and treating other patients.  Critical care was necessary to treat or prevent imminent or life-threatening deterioration.  Critical care was time spent personally by me on the following activities: development of treatment plan with patient and/or surrogate as well as nursing, discussions with consultants, evaluation of patient's response to treatment, examination of patient, obtaining history from patient or surrogate, ordering and performing treatments and interventions, ordering and review of laboratory studies, ordering and review of radiographic studies, pulse oximetry and re-evaluation of patient's condition.   Medications Ordered in ED Medications  0.9 %  sodium chloride infusion (10 mL/hr Intravenous New Bag/Given 03/14/18 2000)     Initial Impression / Assessment and Plan / ED Course  I have reviewed the triage vital signs and the nursing notes.  Pertinent labs & imaging results that were available during my care of the patient were reviewed by me and considered in my medical decision making (see chart for details).    [name] is a 57 y.o. female who presents to ED from dialysis after completing full treatment. Hgb of 6.6 Wednesday. Scheduled for blood transfusion on Monday but didn't feel as if she could wait given her progressively worsening shortness of  breath and weakness.  Hemoglobin here of 7.2.  Discussed with nephrology who recommends giving 1 unit of blood admitting to medicine.  Hospitalist consulted who will admit.  Patient discussed with Dr. Roderic Palau who agrees with treatment plan.    Final Clinical Impressions(s) / ED Diagnoses   Final diagnoses:  Symptomatic anemia    ED Discharge Orders    None       Amadu Schlageter, Ozella Almond, PA-C 03/14/18 2031    Milton Ferguson, MD 03/15/18 2118

## 2018-03-14 NOTE — ED Triage Notes (Signed)
Pt in c/o low hemoglobin, last check on Wednesday showed it was 6.6, pt has scheduled blood transfusion on Monday but today is feeling more weak and having some shortness of breath with exertion, history of transfusions in the past, dialysis patient and completed last treatment today

## 2018-03-14 NOTE — ED Provider Notes (Signed)
Patient placed in Quick Look pathway, seen and evaluated   Chief Complaint: weakness  HPI:  [name] is a 57 y.o. female who presents to the ED from dialysis with c/o feeling tired and low blood.Last hemoglobin, check on Wednesday showed it was 6.6, pt has scheduled blood transfusion on Monday but today is feeling more weak and having some shortness of breath with exertion, history of transfusions in the past, dialysis patient and completed last treatment today  ROS: General: weakness  Physical Exam:  BP (!) 147/74 (BP Location: Right Arm)   Pulse 87   Temp 98 F (36.7 C) (Oral)   Resp 20   LMP 12/05/2010 (LMP Unknown)   SpO2 100%    Gen: No distress  Neuro: Awake and Alert  Skin: Warm and dry  Resp: no distress  Initiation of care has begun. The patient has been counseled on the process, plan, and necessity for staying for the completion/evaluation, and the remainder of the medical screening examination    Ashley Murrain, NP 03/14/18 1351    Noemi Chapel, MD 03/15/18 [phone]

## 2018-03-14 NOTE — H&P (Addendum)
History and Physical    [name] BSW:[id number] DOB: 01-27-1961 DOA: 03/14/2018  PCP: Benito Mccreedy, MD Patient coming from: Dialysis center  Chief Complaint: Low blood count  HPI: [name] is a 57 y.o. female with medical history significant of end-stage renal disease on hemodialysis MWF, chronic anemia, chronic diastolic congestive heart failure, mitral stenosis, hypertension presenting to the hospital from her dialysis center for evaluation of weakness and shortness of breath.  Patient states she has been having generalized weakness, dyspnea on exertion, and intermittent lightheadedness for the past 1 week.  Denies having any chest pain or syncope.  States her last hemoglobin check 3 days ago revealed hemoglobin 6.6, she was scheduled to get a transfusion on Monday but today felt more weak and SOB at dialysis.  Reports having received several blood transfusions in the past for her chronic anemia. Completed her last dialysis treatment today. Denies having hematemesis, hematochezia, or melena.   ED Course: Blood pressure elevated but remainder of vitals stable.  Blood pressure 147/74 on arrival.  Hemoglobin checked in the ED 7.2 (baseline around 9).  ED physician spoke to nephrology and they recommended giving the patient 1 unit of packed red blood cells.  TRH patient admitted.  Review of Systems: As per HPI otherwise 10 point review of systems negative.  Past Medical History:  Diagnosis Date  . Anemia   . Aortic stenosis 09/25/2016   Echo 07/24/16: Mod conc LVH, EF 60-65, no RWMA, Gr 2 DDd, bicuspid aortic valve, mild to mod AS (mean 18, peak 38), MAC, mod mitral stenosis (mean 9, peak 19), mild to mod MR, severe LAE, normal RVSF, mild RAE, mild TR  . Arthritis    "joints" (10/23/2017)  . Blood transfusion '08   [facility]; "low HgB" (10/23/2017)  . Chronic diastolic CHF (congestive heart failure) ([place])   . Dysfunctional uterine bleeding 12/19/2010  . ESRD (end stage renal disease) on  dialysis [facility])    "MWF; [name]." (10/23/2017)  . GERD (gastroesophageal reflux disease)   . Headache   . Heart murmur   . Hemodialysis patient [facility])    right extremity port  . History of hiatal hernia   . Hx of cardiovascular stress test    Lexiscan Myoview 4/16:  Normal stress nuclear study, EF 59%  . Hypertension   . Lupus ([place])    "? kind" (10/23/2017)  . Mitral stenosis    Echo 4/16:  EF 55-60%, no RWMA, Gr 1 DD, mod MS (mean 9 mmHg), mod LAE, mild RAE, PASP 65, mod to severe TR, trivial eff // Echo 6/19:  Mild LVH, EF 55-60, no RWMA, Gr 2 DD, mild to mod AS (Mean 20), severe MS (mean 17), massive LAE, PASP 48, trivial effusion   . Peptic ulcer disease   . Pneumonia 10/21/2017  . PONV (postoperative nausea and vomiting)   . Stroke [facility])    per patient "they said i had a small stroke but i couldnt even tell"    Past Surgical History:  Procedure Laterality Date  . AV FISTULA PLACEMENT Right 02/25/2018   Procedure: INSERTION OF 109m x 16cm ARTEGRAFT;  Surgeon: CWaynetta Sandy MD;  [place]  Service: Vascular;  Laterality: Right;  . COLONOSCOPY W/ BIOPSIES AND POLYPECTOMY    . DIALYSIS FISTULA CREATION  2007  . ENDOMETRIAL ABLATION    . ESOPHAGOGASTRODUODENOSCOPY N/A 07/31/2014   Procedure: ESOPHAGOGASTRODUODENOSCOPY (EGD);  Surgeon: MClarene Essex MD;  [facility]  Service: Endoscopy;  Laterality: N/A;  .  FISTULOGRAM Right 02/25/2018   Procedure: FISTULOGRAM ARM;  Surgeon: Waynetta Sandy, MD;  Location: [place];  Service: Vascular;  Laterality: Right;  . HEMATOMA EVACUATION Right 03/06/2018   Procedure: EVACUATION HEMATOMA RIGHT UPPER ARM;  Surgeon: Waynetta Sandy, MD;  Location: [place];  Service: Vascular;  Laterality: Right;  . INSERTION OF ARTERIOVENOUS (AV) ARTEGRAFT ARM Right 09/26/2017   Procedure: INSERTION OF ARTERIOVENOUS (AV) ARTEGRAFT INTO RIGHT ARM;  Surgeon: Waynetta Sandy, MD;  Location: [place];  Service: Vascular;   Laterality: Right;  . INSERTION OF DIALYSIS CATHETER Right 03/06/2018   Procedure: INSERTION OF DIALYSIS CATHETER, right internal jugular;  Surgeon: Waynetta Sandy, MD;  Location: [place];  Service: Vascular;  Laterality: Right;  . REVISON OF ARTERIOVENOUS FISTULA Right 09/26/2017   Procedure: REVISION OF ARTERIOVENOUS FISTULA RIGHT ARM WITH ARTEGRAFT;  Surgeon: Waynetta Sandy, MD;  Location: [place];  Service: Vascular;  Laterality: Right;  . REVISON OF ARTERIOVENOUS FISTULA Right 02/25/2018   Procedure: REVISION OF ARTERIOVENOUS FISTULA;  Surgeon: Waynetta Sandy, MD;  Location: [place];  Service: Vascular;  Laterality: Right;  . SHOULDER OPEN ROTATOR CUFF REPAIR Right 10/09/2016   Procedure: ROTATOR CUFF REPAIR SHOULDER OPEN partial acrominectomy and extensive synovectomy;  Surgeon: Latanya [name], MD;  Location: [facility];  Service: Orthopedics;  Laterality: Right;  RNFA  . TEE WITHOUT CARDIOVERSION N/A 01/02/2018   Procedure: TRANSESOPHAGEAL ECHOCARDIOGRAM (TEE) Bubble Study;  Surgeon: Larey Dresser, MD;  Location[facility];  Service: Cardiovascular;  Laterality: N/A;     reports that she has been smoking cigarettes. She started smoking about 4 months ago. She has a 1.00 pack-year smoking history. She has never used smokeless tobacco. She reports that she drinks about 1.0 standard drinks of alcohol per week. She reports that she has current or past drug history. Drugs: Marijuana and Cocaine.  No Known Allergies  Family History  Problem Relation Age of Onset  . Liver cancer Maternal Grandmother   . Lymphoma Maternal Aunt   . Hypertension Mother   . Renal Disease Father   . Hypertension Father   . Heart attack Neg Hx     Prior to Admission medications   Medication Sig Start Date End Date Taking? Authorizing Provider  acetaminophen (TYLENOL) 500 MG tablet Take 1,000 mg by mouth as needed for mild pain.    [provider]  amLODipine (NORVASC) 10 MG  tablet Take 10 mg by mouth at bedtime.     [provider]  aspirin EC 81 MG tablet Take 81 mg by mouth at bedtime.    [provider]  calcitRIOL (ROCALTROL) 0.25 MCG capsule Take 7 capsules (1.75 mcg total) by mouth every other day. 10/27/17   Phillips Grout, MD  cloNIDine (CATAPRES) 0.2 MG tablet TAKE 1 TABLET(0.2 MG) BY MOUTH TWICE DAILY Patient taking differently: Take 0.2 mg by mouth 2 (two) times daily.  11/04/17   Fay Records, MD  labetalol (NORMODYNE) 300 MG tablet Take 2 tablets (600 mg total) by mouth 3 (three) times daily. 10/25/17   Phillips Grout, MD  lanthanum (FOSRENOL) 1000 MG chewable tablet Chew 2 tablets (2,000 mg total) by mouth 3 (three) times daily with meals. Patient taking differently: Chew 1,000-2,000 mg by mouth 3 (three) times daily with meals.  05/05/15   Eugenie Filler, MD  multivitamin (RENA-VIT) TABS tablet Take 1 tablet by mouth daily.    [provider]  ondansetron (ZOFRAN) 4 MG tablet Take 4  mg by mouth every 6 (six) hours as needed for nausea or vomiting.    [provider]  oxyCODONE-acetaminophen (PERCOCET) 7.5-325 MG tablet Take 1 tablet by mouth every 6 (six) hours as needed for severe pain. 03/06/18   Rhyne, Hulen Shouts, PA-C  pantoprazole (PROTONIX) 40 MG tablet Take 40 mg by mouth 2 (two) times daily.     [provider]    Physical Exam: Vitals:   03/14/18 1345 03/14/18 1832 03/14/18 1954 03/14/18 2009  BP: (!) 147/74 (!) 165/82 (!) 170/87 (!) 169/86  Pulse: 87 77 84 85  Resp: _0 Temp: 98 F (36.7 C)  98.9 F (37.2 C) 98.9 F (37.2 C)  TempSrc: Oral  Oral Oral  SpO2: 100% 96%  94%   Physical Exam  Constitutional: She is oriented to person, place, and time. She appears well-developed and well-nourished. No distress.  Resting comfortably in a hospital stretcher watching television  HENT:  Mouth/Throat: Oropharynx is clear and moist.  Eyes: Right eye exhibits no discharge. Left eye  exhibits no discharge.  Neck: Neck supple. No tracheal deviation present.  Cardiovascular: Normal rate, regular rhythm and intact distal pulses.  Murmur heard. Pulmonary/Chest: Effort normal and breath sounds normal. No respiratory distress. She has no wheezes. She has no rales.  Abdominal: Soft. Bowel sounds are normal. She exhibits no distension. There is no tenderness.  Musculoskeletal: She exhibits no edema.  Neurological: She is alert and oriented to person, place, and time.  Skin: Skin is warm and dry.  Psychiatric: She has a normal mood and affect. Her behavior is normal.     Labs on Admission: I have personally reviewed following labs and imaging studies  CBC: Recent Labs  Lab 03/14/18 1410  WBC 4.5  NEUTROABS 3.3  HGB 7.2*  HCT 24.4*  MCV 98.8  PLT 759   Basic Metabolic Panel: Recent Labs  Lab 03/14/18 1410  NA 137  K 3.4*  CL 96*  CO2 32  GLUCOSE 123*  BUN 13  CREATININE 2.77*  CALCIUM 8.6*   GFR: Estimated Creatinine Clearance: 21.2 mL/min (A) (by C-G formula based on SCr of 2.77 mg/dL (H)). Liver Function Tests: Recent Labs  Lab 03/14/18 1410  AST 21  ALT 7  ALKPHOS 95  BILITOT 0.6  PROT 6.9  ALBUMIN 3.1*   No results for input(s): LIPASE, AMYLASE in the last 168 hours. No results for input(s): AMMONIA in the last 168 hours. Coagulation Profile: No results for input(s): INR, PROTIME in the last 168 hours. Cardiac Enzymes: No results for input(s): CKTOTAL, CKMB, CKMBINDEX, TROPONINI in the last 168 hours. BNP (last 3 results) No results for input(s): PROBNP in the last 8760 hours. HbA1C: No results for input(s): HGBA1C in the last 72 hours. CBG: No results for input(s): GLUCAP in the last 168 hours. Lipid Profile: No results for input(s): CHOL, HDL, LDLCALC, TRIG, CHOLHDL, LDLDIRECT in the last 72 hours. Thyroid Function Tests: No results for input(s): TSH, T4TOTAL, FREET4, T3FREE, THYROIDAB in the last 72 hours. Anemia Panel: No results  for input(s): VITAMINB12, FOLATE, FERRITIN, TIBC, IRON, RETICCTPCT in the last 72 hours. Urine analysis:    Component Value Date/Time   COLORURINE YELLOW 05/01/2015 2005   APPEARANCEUR CLEAR 05/01/2015 2005   LABSPEC 1.012 05/01/2015 2005   PHURINE 8.5 (H) 05/01/2015 2005   GLUCOSEU 100 (A) 05/01/2015 2005   HGBUR SMALL (A) 05/01/2015 2005   BILIRUBINUR NEGATIVE 05/01/2015 2005   [place] NEGATIVE 05/01/2015 2005  PROTEINUR 100 (A) 05/01/2015 2005   UROBILINOGEN 0.2 07/27/2014 0150   NITRITE NEGATIVE 05/01/2015 2005   LEUKOCYTESUR NEGATIVE 05/01/2015 2005    Radiological Exams on Admission: No results found.  Assessment/Plan Principal Problem:   Symptomatic anemia Active Problems:   End-stage renal disease on hemodialysis (HCC)   HTN (hypertension)   Hypokalemia   Chronic pain   GERD (gastroesophageal reflux disease)   Symptomatic anemia Patient is presenting with fatigue, intermittent lightheadedness, and dyspnea with exertion.  Found to have a hemoglobin of 6.6 three days ago at dialysis.  Repeat hemoglobin here 7.2.  Her baseline is around 9.  Patient had a right upper extremity fistulogram and revision of right arm fistula with interposition Artegraft on October 8.  On October 17 she had evacuation of a right arm hematoma and hemoglobin was 7.1 at that time. Patient denies having any hematemesis, hematochezia, or melena. Nephrology recommended giving 1 unit of packed red blood cells. -1 unit packed red blood cells ordered in the ED -Repeat CBC in a.m. -Orthostatics after blood transfusion  End-stage renal disease on hemodialysis MWF Last hemodialysis session was today.  Does not appear volume overloaded on exam. -Nephrology has been consulted by ED provider -Continue renal meds -Renal function panel in a.m.  Mild hypokalemia Potassium 3.4. -Replete orally -Check magnesium -Renal function panel in a.m.  Hypertension Pressure currently elevated. -Continue home  amlodipine, clonidine, labetalol  Chronic pain -Continue home Percocet 7.5-325 mg 1 tablet every 6 hours as needed  GERD -Continue PPI  DVT prophylaxis: Subcutaneous heparin Code Status: Patient wishes to be full code. Family Communication: No family present at bedside. Disposition Plan: Anticipate discharge to home tomorrow. Consults called: Nephrology consulted by ED provider. Admission status: Observation   Shela Leff MD [facility] Pager [phone]  If 7PM-7AM, please contact night-coverage [email or web address] Password [facility]  03/14/2018, 8:49 PM

## 2018-03-15 DIAGNOSIS — D649 Anemia, unspecified: Secondary | ICD-10-CM | POA: Diagnosis not present

## 2018-03-15 DIAGNOSIS — I120 Hypertensive chronic kidney disease with stage 5 chronic kidney disease or end stage renal disease: Secondary | ICD-10-CM | POA: Diagnosis not present

## 2018-03-15 LAB — BPAM RBC
Blood Product Expiration Date: 201911092359
ISSUE DATE / TIME: 201910251942
Unit Type and Rh: 600

## 2018-03-15 LAB — RENAL FUNCTION PANEL
ALBUMIN: 2.9 g/dL — AB (ref 3.5–5.0)
ANION GAP: 11 (ref 5–15)
BUN: 27 mg/dL — ABNORMAL HIGH (ref 6–20)
CO2: 30 mmol/L (ref 22–32)
Calcium: 9.7 mg/dL (ref 8.9–10.3)
Chloride: 97 mmol/L — ABNORMAL LOW (ref 98–111)
Creatinine, Ser: 4.81 mg/dL — ABNORMAL HIGH (ref 0.44–1.00)
GFR, EST AFRICAN AMERICAN: 11 mL/min — AB (ref >60)
GFR, EST NON AFRICAN AMERICAN: 9 mL/min — AB (ref >60)
Glucose, Bld: 89 mg/dL (ref 70–99)
PHOSPHORUS: 6.6 mg/dL — AB (ref 2.5–4.6)
POTASSIUM: 4.4 mmol/L (ref 3.5–5.1)
Sodium: 138 mmol/L (ref 135–145)

## 2018-03-15 LAB — CBC
HEMATOCRIT: 26.7 % — AB (ref 36.0–46.0)
HEMOGLOBIN: 7.9 g/dL — AB (ref 12.0–15.0)
MCH: 28.2 pg (ref 26.0–34.0)
MCHC: 29.6 g/dL — AB (ref 30.0–36.0)
MCV: 95.4 fL (ref 80.0–100.0)
NRBC: 0 % (ref 0.0–0.2)
Platelets: 203 10*3/uL (ref 150–400)
RBC: 2.8 MIL/uL — AB (ref 3.87–5.11)
RDW: 18.4 % — ABNORMAL HIGH (ref 11.5–15.5)
WBC: 4.8 10*3/uL (ref 4.0–10.5)

## 2018-03-15 LAB — TYPE AND SCREEN
ABO/RH(D): A NEG
Antibody Screen: NEGATIVE
Unit division: 0

## 2018-03-15 MED ORDER — PENTAFLUOROPROP-TETRAFLUOROETH EX AERO: 1.0000 "application " | INHALATION_SPRAY | CUTANEOUS | Status: DC | PRN

## 2018-03-15 MED ORDER — SODIUM CHLORIDE 0.9 % IV SOLN: 100.0000 mL | INTRAVENOUS | Status: DC | PRN

## 2018-03-15 MED ORDER — CLONIDINE HCL 0.2 MG PO TABS: 0.2000 mg | ORAL_TABLET | ORAL | 5 refills | Status: DC

## 2018-03-15 MED ORDER — ALTEPLASE 2 MG IJ SOLR: 2.0000 mg | Freq: Once | INTRAMUSCULAR | Status: DC | PRN

## 2018-03-15 MED ORDER — LIDOCAINE-PRILOCAINE 2.5-2.5 % EX CREA: 1.0000 "application " | TOPICAL_CREAM | CUTANEOUS | Status: DC | PRN

## 2018-03-15 MED ORDER — LABETALOL HCL 300 MG PO TABS: 600.0000 mg | ORAL_TABLET | ORAL | 5 refills | Status: DC

## 2018-03-15 MED ORDER — LIDOCAINE HCL (PF) 1 % IJ SOLN: 5.0000 mL | INTRAMUSCULAR | Status: DC | PRN

## 2018-03-15 MED ORDER — CHLORHEXIDINE GLUCONATE CLOTH 2 % EX PADS: 6.0000 | MEDICATED_PAD | Freq: Every day | CUTANEOUS | Status: DC

## 2018-03-15 MED ORDER — HEPARIN SODIUM (PORCINE) 1000 UNIT/ML IJ SOLN
INTRAMUSCULAR | Status: AC
  Filled 2018-03-15: qty 3

## 2018-03-15 MED ORDER — HEPARIN SODIUM (PORCINE) 1000 UNIT/ML DIALYSIS: 1000.0000 [IU] | INTRAMUSCULAR | Status: DC | PRN

## 2018-03-15 MED ORDER — AMLODIPINE BESYLATE 10 MG PO TABS: 10.0000 mg | ORAL_TABLET | Freq: Every day | ORAL | 4 refills | Status: DC

## 2018-03-15 NOTE — Plan of Care (Signed)

## 2018-03-15 NOTE — Discharge Instructions (Signed)
1) you have anemia----repeat CBC/complete blood count with hemodialysis on Monday, 03/17/2018 2) continue iron supplements 3) please ask your nephrologist about frequency and dosage of your EPO agent/Aranesp/Procrit to help your anemia and reduce the frequency of blood transfusions 4)Avoid ibuprofen/Advil/Aleve/Motrin/Goody Powders/Naproxen/BC powders/Meloxicam/Diclofenac/Indomethacin and other Nonsteroidal anti-inflammatory medications as these will make you more likely to bleed and can cause stomach ulcers, 5)Very low-salt diet advised 6)Weigh yourself daily, call if you gain more than 3 pounds in 1 day or more than 5 pounds in 1 week as your diuretic medications may need to be adjusted 7)Limit your Fluid  intake to no more than 60 ounces (1.8 Liters) per day

## 2018-03-15 NOTE — Discharge Summary (Signed)
[name], is a 57 y.o. female  DOB 1960-08-08  MRN [id number].  Admission date:  03/14/2018  Admitting Physician  Connie Leff, MD  Discharge Date:  03/15/2018   Primary MD  Connie Mccreedy, MD  Recommendations for primary care physician for things to follow:   1) you have anemia----repeat CBC/complete blood count with hemodialysis on Monday, 03/17/2018 2) continue iron supplements 3) please ask your nephrologist about frequency and dosage of your EPO agent/Aranesp/Procrit to help your anemia and reduce the frequency of blood transfusions 4)Avoid ibuprofen/Advil/Aleve/Motrin/Goody Powders/Naproxen/BC powders/Meloxicam/Diclofenac/Indomethacin and other Nonsteroidal anti-inflammatory medications as these will make you more likely to bleed and can cause stomach ulcers, 5)Very low-salt diet advised 6)Weigh yourself daily, call if you gain more than 3 pounds in 1 day or more than 5 pounds in 1 week as your diuretic medications may need to be adjusted 7)Limit your Fluid  intake to no more than 60 ounces (1.8 Liters) per day   Admission Diagnosis  Symptomatic anemia [D64.9]   Discharge Diagnosis  Symptomatic anemia [D64.9]    Principal Problem:   Symptomatic anemia Active Problems:   End-stage renal disease on hemodialysis (HCC)   HTN (hypertension)   Hypokalemia   Chronic pain   GERD (gastroesophageal reflux disease)      Past Medical History:  Diagnosis Date  . Anemia   . Aortic stenosis 09/25/2016   Echo 07/24/16: Mod conc LVH, EF 60-65, no RWMA, Gr 2 DDd, bicuspid aortic valve, mild to mod AS (mean 18, peak 38), MAC, mod mitral stenosis (mean 9, peak 19), mild to mod MR, severe LAE, normal RVSF, mild RAE, mild TR  . Arthritis    "joints" (10/23/2017)  . Blood transfusion '08   [facility]; "low HgB" (10/23/2017)  . Chronic diastolic CHF (congestive heart failure) ([place])   . Dysfunctional uterine  bleeding 12/19/2010  . ESRD (end stage renal disease) on dialysis [facility])    "MWF; [name]." (10/23/2017)  . GERD (gastroesophageal reflux disease)   . Headache   . Heart murmur   . Hemodialysis patient [facility])    right extremity port  . History of hiatal hernia   . Hx of cardiovascular stress test    Lexiscan Myoview 4/16:  Normal stress nuclear study, EF 59%  . Hypertension   . Lupus ([place])    "? kind" (10/23/2017)  . Mitral stenosis    Echo 4/16:  EF 55-60%, no RWMA, Gr 1 DD, mod MS (mean 9 mmHg), mod LAE, mild RAE, PASP 65, mod to severe TR, trivial eff // Echo 6/19:  Mild LVH, EF 55-60, no RWMA, Gr 2 DD, mild to mod AS (Mean 20), severe MS (mean 17), massive LAE, PASP 48, trivial effusion   . Peptic ulcer disease   . Pneumonia 10/21/2017  . PONV (postoperative nausea and vomiting)   . Stroke [facility])    per patient "they said i had a small stroke but i couldnt even tell"    Past Surgical History:  Procedure Laterality Date  . AV FISTULA  PLACEMENT Right 02/25/2018   Procedure: INSERTION OF 37m x 16cm ARTEGRAFT;  Surgeon: CWaynetta Sandy MD;  [place]  Service: Vascular;  Laterality: Right;  . COLONOSCOPY W/ BIOPSIES AND POLYPECTOMY    . DIALYSIS FISTULA CREATION  2007  . ENDOMETRIAL ABLATION    . ESOPHAGOGASTRODUODENOSCOPY N/A 07/31/2014   Procedure: ESOPHAGOGASTRODUODENOSCOPY (EGD);  Surgeon: MClarene Essex MD;  [facility]  Service: Endoscopy;  Laterality: N/A;  . FISTULOGRAM Right 02/25/2018   Procedure: FISTULOGRAM ARM;  Surgeon: CWaynetta Sandy MD;  [place]  Service: Vascular;  Laterality: Right;  . HEMATOMA EVACUATION Right 03/06/2018   Procedure: EVACUATION HEMATOMA RIGHT UPPER ARM;  Surgeon: CWaynetta Sandy MD;  [place]  Service: Vascular;  Laterality: Right;  . INSERTION OF ARTERIOVENOUS (AV) ARTEGRAFT ARM Right 09/26/2017   Procedure: INSERTION OF ARTERIOVENOUS (AV) ARTEGRAFT INTO RIGHT ARM;  Surgeon: CWaynetta Sandy MD;  [place]  Service: Vascular;  Laterality: Right;  . INSERTION OF DIALYSIS CATHETER Right 03/06/2018   Procedure: INSERTION OF DIALYSIS CATHETER, right internal jugular;  Surgeon: CWaynetta Sandy MD;  [place]  Service: Vascular;  Laterality: Right;  . REVISON OF ARTERIOVENOUS FISTULA Right 09/26/2017   Procedure: REVISION OF ARTERIOVENOUS FISTULA RIGHT ARM WITH ARTEGRAFT;  Surgeon: CWaynetta Sandy MD;  [place]  Service: Vascular;  Laterality: Right;  . REVISON OF ARTERIOVENOUS FISTULA Right 02/25/2018   Procedure: REVISION OF ARTERIOVENOUS FISTULA;  Surgeon: CWaynetta Sandy MD;  [place]  Service: Vascular;  Laterality: Right;  . SHOULDER OPEN ROTATOR CUFF REPAIR Right 10/09/2016   Procedure: ROTATOR CUFF REPAIR SHOULDER OPEN partial acrominectomy and extensive synovectomy;  Surgeon: GLatanya [name] MD;  [facility];  Service: Orthopedics;  Laterality: Right;  RNFA  . TEE WITHOUT CARDIOVERSION N/A 01/02/2018   Procedure: TRANSESOPHAGEAL ECHOCARDIOGRAM (TEE) Bubble Study;  Surgeon: MLarey Dresser MD;  [facility]  Service: Cardiovascular;  Laterality: N/A;       HPI  from the history and physical done on the day of admission:   PCP: OBenito Mccreedy MD Patient coming from: Dialysis center  Chief Complaint: Low blood count  HPI: [name] a 57y.o. female with medical history significant of end-stage renal disease on hemodialysis MWF, chronic anemia, chronic diastolic congestive heart failure, mitral stenosis, hypertension presenting to the hospital from her dialysis center for evaluation of weakness and shortness of breath.  Patient states she has been having generalized weakness, dyspnea on exertion, and intermittent lightheadedness for the past 1 week.  Denies having any chest pain or syncope.  States her last hemoglobin check 3 days ago revealed hemoglobin 6.6, she was scheduled to  get a transfusion on Monday but today felt more weak and SOB at dialysis.  Reports having received several blood transfusions in the past for her chronic anemia. Completed her last dialysis treatment today. Denies having hematemesis, hematochezia, or melena.   ED Course: Blood pressure elevated but remainder of vitals stable.  Blood pressure 147/74 on arrival.  Hemoglobin checked in the ED 7.2 (baseline around 9).  ED physician spoke to nephrology and they recommended giving the patient 1 unit of packed red blood cells.  TRH patient admitted.      Hospital Course:     1) acute on chronic anemia in the patient with anemia of ESRD----patient was admitted with symptomatic anemia, hemoglobin was 7.1 at outside facility, repeat hemoglobin here was 7.2,, received 1 unit of  packed cells, posttransfusion hemoglobin 7.9, EPO agent as per nephrology.... Patient states fatigue, dizziness and dyspnea on exertion has improved after transfusion of 1 unit of packed cells and subsequent hemodialysis today.  No evidence of ongoing acute blood loss  2)ESRD----patient gets hemodialysis on Mondays Wednesdays and Fridays, she did have hemodialysis on 03/15/2017 after transfusion of 1 unit of packed cells, next HD session on Monday, 03/17/2018  3)HTN--- compliance with labetalol, clonidine and amlodipine strongly advised, refills given to patient  Discharge Condition: stable  Follow UP----with nephrologist on Monday, 03/17/2017 for hemodialysis   Consults obtained -nephrology for HD  Diet and Activity recommendation:  As advised  Discharge Instructions    Discharge Instructions    Call MD for:  difficulty breathing, headache or visual disturbances   Complete by:  As directed    Call MD for:  persistant dizziness or light-headedness   Complete by:  As directed    Call MD for:  persistant nausea and vomiting   Complete by:  As directed    Call MD for:  severe uncontrolled pain   Complete by:  As directed      Call MD for:  temperature >100.4   Complete by:  As directed    Diet - low sodium heart healthy   Complete by:  As directed    Discharge instructions   Complete by:  As directed    1) you have anemia----repeat CBC/complete blood count with hemodialysis on Monday, 03/17/2018 2) continue iron supplements 3) please ask your nephrologist about frequency and dosage of your EPO agent/Aranesp/Procrit to help your anemia and reduce the frequency of blood transfusions 4)Avoid ibuprofen/Advil/Aleve/Motrin/Goody Powders/Naproxen/BC powders/Meloxicam/Diclofenac/Indomethacin and other Nonsteroidal anti-inflammatory medications as these will make you more likely to bleed and can cause stomach ulcers, 5)Very low-salt diet advised 6)Weigh yourself daily, call if you gain more than 3 pounds in 1 day or more than 5 pounds in 1 week as your diuretic medications may need to be adjusted 7)Limit your Fluid  intake to no more than 60 ounces (1.8 Liters) per day   Increase activity slowly   Complete by:  As directed         Discharge Medications     Allergies as of 03/15/2018   No Known Allergies     Medication List    TAKE these medications   acetaminophen 500 MG tablet Commonly known as:  TYLENOL Take 1,000 mg by mouth every 6 (six) hours as needed for headache (pain).   amLODipine 10 MG tablet Commonly known as:  NORVASC Take 1 tablet (10 mg total) by mouth at bedtime.   aspirin EC 81 MG tablet Take 81 mg by mouth at bedtime.   AURYXIA 1 GM 210 MG(Fe) tablet Generic drug:  ferric citrate Take 210-420 mg by mouth See admin instructions. Take 2 tablets (420 mg) by mouth up to 3 times daily with meals, take 1 tablet (210 mg) with snacks   calcitRIOL 0.25 MCG capsule Commonly known as:  ROCALTROL Take 7 capsules (1.75 mcg total) by mouth every other day. What changed:  when to take this   cloNIDine 0.2 MG tablet Commonly known as:  CATAPRES Take 1 tablet (0.2 mg total) by mouth See admin  instructions. Take 1 tablet (0.2 mg) by mouth twice daily - on dialysis days take after dialysis and at bedtime, on other days take one in the morning and one at night. What changed:  See the new instructions.   labetalol 300 MG tablet Commonly  known as:  NORMODYNE Take 2 tablets (600 mg total) by mouth See admin instructions. Take 2 tablets (600 mg) by mouth three times daily on Sunday, Tuesday, Thursday, Saturday (non-dialysis days), take 2 tablets (600 mg) twice daily on Monday, Wednesday, Friday - after dialysis and at bedtime   lanthanum 1000 MG chewable tablet Commonly known as:  FOSRENOL Chew 2 tablets (2,000 mg total) by mouth 3 (three) times daily with meals. What changed:    how much to take  when to take this  additional instructions   multivitamin Tabs tablet Take 1 tablet by mouth at bedtime.   ondansetron 4 MG tablet Commonly known as:  ZOFRAN Take 4 mg by mouth every 6 (six) hours as needed for nausea or vomiting.   oxyCODONE-acetaminophen 7.5-325 MG tablet Commonly known as:  PERCOCET Take 1 tablet by mouth every 6 (six) hours as needed for severe pain.   pantoprazole 40 MG tablet Commonly known as:  PROTONIX Take 40 mg by mouth 2 (two) times daily.       Major procedures and Radiology Reports - PLEASE review detailed and final reports for all details, in brief -    Dg Ang/ext/uni/or Right  Result Date: 02/25/2018 CLINICAL DATA:  57 year old female with a history intraoperative fistulagram EXAM: RIGHT ANG/EXT/UNI/ OR COMPARISON:  None. FINDINGS: Limited intraoperative venogram of the right upper extremity. Images demonstrate patent proximal cephalic vein and cephalic arch, as well as a patent subclavian vein and superior vena cava. IMPRESSION: Limited images of a right upper extremity venogram, as above. Please refer to the dictated operative report for full details of intraoperative findings and procedure. Electronically Signed   By: Corrie Mckusick D.O.   On:  02/25/2018 13:06   Dg Chest Port 1 View  Result Date: 03/06/2018 CLINICAL DATA:  Dialysis catheter insertion EXAM: PORTABLE CHEST 1 VIEW COMPARISON:  Portable exam 1240 hours compared to 10/21/2017 FINDINGS: New LEFT jugular dual-lumen central venous catheter tip projecting over SVC near cavoatrial junction. Enlargement of cardiac silhouette with pulmonary vascular congestion. Atherosclerotic calcification aorta. Improved bibasilar infiltrates with residual infiltrate RIGHT infrahilar. Mild bibasilar atelectasis and central peribronchial thickening persist. No gross pleural effusion or pneumothorax. IMPRESSION: New LEFT jugular dialysis catheter without pneumothorax. Improved pulmonary infiltrates with bibasilar atelectasis and residual RIGHT basilar infiltrate. Enlargement of cardiac silhouette with pulmonary vascular congestion. Electronically Signed   By: Lavonia Dana M.D.   On: 03/06/2018 14:03    Micro Results   No results found for this or any previous visit (from the past 240 hour(s)).     Today   Subjective    [name] today has no concerns, dizziness, fatigue and shortness of breath has improved after transfusion and subsequent hemodialysis          Patient has been seen and examined prior to discharge   Objective   Blood pressure (!) (P) 160/90, pulse (P) 86, temperature (P) 98 F (36.7 C), temperature source (P) Oral, resp. rate (P) 18, weight (P) 59.4 kg, last menstrual period 12/05/2010, SpO2 (P) 96 %.   Intake/Output Summary (Last 24 hours) at 03/15/2018 1739 Last data filed at 03/15/2018 1516 Gross per 24 hour  Intake 996 ml  Output 2000 ml  Net -1004 ml    Exam Gen:- Awake Alert, in no acute distress HEENT:- [place].AT, No sclera icterus Neck-Supple Neck,No JVD,.  Lungs-  CTAB, good air movement bilaterally CV- S1, S2 normal, regular, left subclavian area with hemodialysis catheter in situ Abd-  +ve B.Sounds,  Abd Soft, No tenderness,    Extremity/Skin:- No   edema,   good pulses Psych-affect is appropriate, oriented x3 Neuro-no new focal deficits, no tremors   Data Review   CBC w Diff:  Lab Results  Component Value Date   WBC 4.8 03/15/2018   HGB 7.9 (L) 03/15/2018   HCT 26.7 (L) 03/15/2018   PLT 203 03/15/2018   LYMPHOPCT 16 03/14/2018   MONOPCT 8 03/14/2018   EOSPCT 1 03/14/2018   BASOPCT 1 03/14/2018    CMP:  Lab Results  Component Value Date   NA 138 03/15/2018   K 4.4 03/15/2018   CL 97 (L) 03/15/2018   CO2 30 03/15/2018   BUN 27 (H) 03/15/2018   CREATININE 4.81 (H) 03/15/2018   PROT 6.9 03/14/2018   ALBUMIN 2.9 (L) 03/15/2018   BILITOT 0.6 03/14/2018   ALKPHOS 95 03/14/2018   AST 21 03/14/2018   ALT 7 03/14/2018  .   Total Discharge time is about 33 minutes  Roxan Hockey M.D on 03/15/2018 at 5:39 PM  Pager---[phone]  Go to [email or web address] - password [email or web address] for contact info  [facility] - Office  [phone]

## 2018-03-17 ENCOUNTER — Encounter (HOSPITAL_COMMUNITY): Payer: Medicare Other

## 2018-03-17 DIAGNOSIS — N2581 Secondary hyperparathyroidism of renal origin: Secondary | ICD-10-CM | POA: Diagnosis not present

## 2018-03-17 DIAGNOSIS — D631 Anemia in chronic kidney disease: Secondary | ICD-10-CM | POA: Diagnosis not present

## 2018-03-17 DIAGNOSIS — D509 Iron deficiency anemia, unspecified: Secondary | ICD-10-CM | POA: Diagnosis not present

## 2018-03-17 DIAGNOSIS — M3210 Systemic lupus erythematosus, organ or system involvement unspecified: Secondary | ICD-10-CM | POA: Diagnosis not present

## 2018-03-17 DIAGNOSIS — N186 End stage renal disease: Secondary | ICD-10-CM | POA: Diagnosis not present

## 2018-03-19 DIAGNOSIS — N2581 Secondary hyperparathyroidism of renal origin: Secondary | ICD-10-CM | POA: Diagnosis not present

## 2018-03-19 DIAGNOSIS — D509 Iron deficiency anemia, unspecified: Secondary | ICD-10-CM | POA: Diagnosis not present

## 2018-03-19 DIAGNOSIS — N186 End stage renal disease: Secondary | ICD-10-CM | POA: Diagnosis not present

## 2018-03-19 DIAGNOSIS — M3210 Systemic lupus erythematosus, organ or system involvement unspecified: Secondary | ICD-10-CM | POA: Diagnosis not present

## 2018-03-19 DIAGNOSIS — D631 Anemia in chronic kidney disease: Secondary | ICD-10-CM | POA: Diagnosis not present

## 2018-03-21 ENCOUNTER — Encounter: Payer: Medicare Other | Admitting: Vascular Surgery

## 2018-03-21 ENCOUNTER — Other Ambulatory Visit: Payer: Self-pay

## 2018-03-21 ENCOUNTER — Ambulatory Visit (INDEPENDENT_AMBULATORY_CARE_PROVIDER_SITE_OTHER): Payer: Self-pay | Admitting: Physician Assistant

## 2018-03-21 VITALS — BP 152/80 | HR 81 | Temp 97.8°F | Resp 16 | Ht 66.0 in | Wt 135.0 lb

## 2018-03-21 DIAGNOSIS — D631 Anemia in chronic kidney disease: Secondary | ICD-10-CM | POA: Diagnosis not present

## 2018-03-21 DIAGNOSIS — Z992 Dependence on renal dialysis: Secondary | ICD-10-CM

## 2018-03-21 DIAGNOSIS — I120 Hypertensive chronic kidney disease with stage 5 chronic kidney disease or end stage renal disease: Secondary | ICD-10-CM | POA: Diagnosis not present

## 2018-03-21 DIAGNOSIS — D509 Iron deficiency anemia, unspecified: Secondary | ICD-10-CM | POA: Diagnosis not present

## 2018-03-21 DIAGNOSIS — M3210 Systemic lupus erythematosus, organ or system involvement unspecified: Secondary | ICD-10-CM | POA: Diagnosis not present

## 2018-03-21 DIAGNOSIS — N186 End stage renal disease: Secondary | ICD-10-CM

## 2018-03-21 DIAGNOSIS — N2581 Secondary hyperparathyroidism of renal origin: Secondary | ICD-10-CM | POA: Diagnosis not present

## 2018-03-21 NOTE — Progress Notes (Signed)
    Postoperative Access Visit   History of Present Illness   [name] is a 57 y.o. year old female who presents for postoperative follow-up for evacuation of right arm hematoma and placement of left IJ tunneled dialysis catheter by Dr. Donzetta Matters on 03/06/2018.  She had had a revision of her right arm fistula by Dr. Donzetta Matters on 02/25/2018 with [place].  She has been dialyzing on a Monday Wednesday Friday schedule from her left IJ tunneled dialysis catheter.  She returns to office for incision check and staple removal.  She denies any drainage from incision of right arm.  She states she notices the swelling in her right arm improving on a daily basis.  She denies any symptoms of a steal in her right hand.    Physical Examination   Vitals:   03/21/18 1447  BP: (!) 152/80  Pulse: 81  Resp: 16  Temp: 97.8 F (36.6 C)  TempSrc: Oral  SpO2: 99%  Weight: 135 lb (61.2 kg)  Height: 5\' 6"  (1.676 m)   Body mass index is 21.79 kg/m.  right arm Incision is healing well, palpable fluid collection in the area of the revision; palpable thrill above and below area of revision; palpable radial pulse right wrist, hand grip is 5/5, sensation in digits is intact    Medical Decision Making   [name] is a 57 y.o. year old female who presents s/p evacuation of hematoma of right arm   The patient's access is not ready for use due to persistent swelling and small fluid collection  Continue hemodialysis via left IJ tunneled dialysis catheter  We will allow more time for edema to improve and fluid collection to hopefully resolve over the next 3 to 4 weeks  By that time we should at least be able to cannulate the fistula above and below the area that was revised and subsequently remove left IJ tunneled dialysis catheter   Dagoberto Ligas PA-C [facility] Office: [phone]

## 2018-03-24 DIAGNOSIS — N186 End stage renal disease: Secondary | ICD-10-CM | POA: Diagnosis not present

## 2018-03-24 DIAGNOSIS — D509 Iron deficiency anemia, unspecified: Secondary | ICD-10-CM | POA: Diagnosis not present

## 2018-03-24 DIAGNOSIS — M3210 Systemic lupus erythematosus, organ or system involvement unspecified: Secondary | ICD-10-CM | POA: Diagnosis not present

## 2018-03-24 DIAGNOSIS — D631 Anemia in chronic kidney disease: Secondary | ICD-10-CM | POA: Diagnosis not present

## 2018-03-24 DIAGNOSIS — N2581 Secondary hyperparathyroidism of renal origin: Secondary | ICD-10-CM | POA: Diagnosis not present

## 2018-03-26 DIAGNOSIS — M3210 Systemic lupus erythematosus, organ or system involvement unspecified: Secondary | ICD-10-CM | POA: Diagnosis not present

## 2018-03-26 DIAGNOSIS — N2581 Secondary hyperparathyroidism of renal origin: Secondary | ICD-10-CM | POA: Diagnosis not present

## 2018-03-26 DIAGNOSIS — D631 Anemia in chronic kidney disease: Secondary | ICD-10-CM | POA: Diagnosis not present

## 2018-03-26 DIAGNOSIS — D509 Iron deficiency anemia, unspecified: Secondary | ICD-10-CM | POA: Diagnosis not present

## 2018-03-26 DIAGNOSIS — N186 End stage renal disease: Secondary | ICD-10-CM | POA: Diagnosis not present

## 2018-03-28 DIAGNOSIS — M3210 Systemic lupus erythematosus, organ or system involvement unspecified: Secondary | ICD-10-CM | POA: Diagnosis not present

## 2018-03-28 DIAGNOSIS — N2581 Secondary hyperparathyroidism of renal origin: Secondary | ICD-10-CM | POA: Diagnosis not present

## 2018-03-28 DIAGNOSIS — D631 Anemia in chronic kidney disease: Secondary | ICD-10-CM | POA: Diagnosis not present

## 2018-03-28 DIAGNOSIS — N186 End stage renal disease: Secondary | ICD-10-CM | POA: Diagnosis not present

## 2018-03-28 DIAGNOSIS — D509 Iron deficiency anemia, unspecified: Secondary | ICD-10-CM | POA: Diagnosis not present

## 2018-03-31 DIAGNOSIS — D631 Anemia in chronic kidney disease: Secondary | ICD-10-CM | POA: Diagnosis not present

## 2018-03-31 DIAGNOSIS — D509 Iron deficiency anemia, unspecified: Secondary | ICD-10-CM | POA: Diagnosis not present

## 2018-03-31 DIAGNOSIS — N2581 Secondary hyperparathyroidism of renal origin: Secondary | ICD-10-CM | POA: Diagnosis not present

## 2018-03-31 DIAGNOSIS — N186 End stage renal disease: Secondary | ICD-10-CM | POA: Diagnosis not present

## 2018-03-31 DIAGNOSIS — M3210 Systemic lupus erythematosus, organ or system involvement unspecified: Secondary | ICD-10-CM | POA: Diagnosis not present

## 2018-04-02 DIAGNOSIS — M3210 Systemic lupus erythematosus, organ or system involvement unspecified: Secondary | ICD-10-CM | POA: Diagnosis not present

## 2018-04-02 DIAGNOSIS — D631 Anemia in chronic kidney disease: Secondary | ICD-10-CM | POA: Diagnosis not present

## 2018-04-02 DIAGNOSIS — N186 End stage renal disease: Secondary | ICD-10-CM | POA: Diagnosis not present

## 2018-04-02 DIAGNOSIS — N2581 Secondary hyperparathyroidism of renal origin: Secondary | ICD-10-CM | POA: Diagnosis not present

## 2018-04-02 DIAGNOSIS — D509 Iron deficiency anemia, unspecified: Secondary | ICD-10-CM | POA: Diagnosis not present

## 2018-04-04 DIAGNOSIS — N2581 Secondary hyperparathyroidism of renal origin: Secondary | ICD-10-CM | POA: Diagnosis not present

## 2018-04-04 DIAGNOSIS — D509 Iron deficiency anemia, unspecified: Secondary | ICD-10-CM | POA: Diagnosis not present

## 2018-04-04 DIAGNOSIS — M3210 Systemic lupus erythematosus, organ or system involvement unspecified: Secondary | ICD-10-CM | POA: Diagnosis not present

## 2018-04-04 DIAGNOSIS — D631 Anemia in chronic kidney disease: Secondary | ICD-10-CM | POA: Diagnosis not present

## 2018-04-04 DIAGNOSIS — N186 End stage renal disease: Secondary | ICD-10-CM | POA: Diagnosis not present

## 2018-04-07 DIAGNOSIS — D509 Iron deficiency anemia, unspecified: Secondary | ICD-10-CM | POA: Diagnosis not present

## 2018-04-07 DIAGNOSIS — N186 End stage renal disease: Secondary | ICD-10-CM | POA: Diagnosis not present

## 2018-04-07 DIAGNOSIS — M3210 Systemic lupus erythematosus, organ or system involvement unspecified: Secondary | ICD-10-CM | POA: Diagnosis not present

## 2018-04-07 DIAGNOSIS — D631 Anemia in chronic kidney disease: Secondary | ICD-10-CM | POA: Diagnosis not present

## 2018-04-07 DIAGNOSIS — N2581 Secondary hyperparathyroidism of renal origin: Secondary | ICD-10-CM | POA: Diagnosis not present

## 2018-04-09 DIAGNOSIS — N186 End stage renal disease: Secondary | ICD-10-CM | POA: Diagnosis not present

## 2018-04-09 DIAGNOSIS — D509 Iron deficiency anemia, unspecified: Secondary | ICD-10-CM | POA: Diagnosis not present

## 2018-04-09 DIAGNOSIS — N2581 Secondary hyperparathyroidism of renal origin: Secondary | ICD-10-CM | POA: Diagnosis not present

## 2018-04-09 DIAGNOSIS — D631 Anemia in chronic kidney disease: Secondary | ICD-10-CM | POA: Diagnosis not present

## 2018-04-09 DIAGNOSIS — M3210 Systemic lupus erythematosus, organ or system involvement unspecified: Secondary | ICD-10-CM | POA: Diagnosis not present

## 2018-04-11 DIAGNOSIS — D509 Iron deficiency anemia, unspecified: Secondary | ICD-10-CM | POA: Diagnosis not present

## 2018-04-11 DIAGNOSIS — N186 End stage renal disease: Secondary | ICD-10-CM | POA: Diagnosis not present

## 2018-04-11 DIAGNOSIS — N2581 Secondary hyperparathyroidism of renal origin: Secondary | ICD-10-CM | POA: Diagnosis not present

## 2018-04-11 DIAGNOSIS — M3210 Systemic lupus erythematosus, organ or system involvement unspecified: Secondary | ICD-10-CM | POA: Diagnosis not present

## 2018-04-11 DIAGNOSIS — D631 Anemia in chronic kidney disease: Secondary | ICD-10-CM | POA: Diagnosis not present

## 2018-04-13 DIAGNOSIS — N2581 Secondary hyperparathyroidism of renal origin: Secondary | ICD-10-CM | POA: Diagnosis not present

## 2018-04-13 DIAGNOSIS — D631 Anemia in chronic kidney disease: Secondary | ICD-10-CM | POA: Diagnosis not present

## 2018-04-13 DIAGNOSIS — D509 Iron deficiency anemia, unspecified: Secondary | ICD-10-CM | POA: Diagnosis not present

## 2018-04-13 DIAGNOSIS — N186 End stage renal disease: Secondary | ICD-10-CM | POA: Diagnosis not present

## 2018-04-13 DIAGNOSIS — M3210 Systemic lupus erythematosus, organ or system involvement unspecified: Secondary | ICD-10-CM | POA: Diagnosis not present

## 2018-04-15 DIAGNOSIS — N186 End stage renal disease: Secondary | ICD-10-CM | POA: Diagnosis not present

## 2018-04-15 DIAGNOSIS — D631 Anemia in chronic kidney disease: Secondary | ICD-10-CM | POA: Diagnosis not present

## 2018-04-15 DIAGNOSIS — D509 Iron deficiency anemia, unspecified: Secondary | ICD-10-CM | POA: Diagnosis not present

## 2018-04-15 DIAGNOSIS — M3210 Systemic lupus erythematosus, organ or system involvement unspecified: Secondary | ICD-10-CM | POA: Diagnosis not present

## 2018-04-15 DIAGNOSIS — N2581 Secondary hyperparathyroidism of renal origin: Secondary | ICD-10-CM | POA: Diagnosis not present

## 2018-04-18 DIAGNOSIS — D509 Iron deficiency anemia, unspecified: Secondary | ICD-10-CM | POA: Diagnosis not present

## 2018-04-18 DIAGNOSIS — D631 Anemia in chronic kidney disease: Secondary | ICD-10-CM | POA: Diagnosis not present

## 2018-04-18 DIAGNOSIS — M3210 Systemic lupus erythematosus, organ or system involvement unspecified: Secondary | ICD-10-CM | POA: Diagnosis not present

## 2018-04-18 DIAGNOSIS — N2581 Secondary hyperparathyroidism of renal origin: Secondary | ICD-10-CM | POA: Diagnosis not present

## 2018-04-18 DIAGNOSIS — N186 End stage renal disease: Secondary | ICD-10-CM | POA: Diagnosis not present

## 2018-04-20 DIAGNOSIS — N186 End stage renal disease: Secondary | ICD-10-CM | POA: Diagnosis not present

## 2018-04-20 DIAGNOSIS — I120 Hypertensive chronic kidney disease with stage 5 chronic kidney disease or end stage renal disease: Secondary | ICD-10-CM | POA: Diagnosis not present

## 2018-04-20 DIAGNOSIS — Z992 Dependence on renal dialysis: Secondary | ICD-10-CM | POA: Diagnosis not present

## 2018-04-21 DIAGNOSIS — D631 Anemia in chronic kidney disease: Secondary | ICD-10-CM | POA: Diagnosis not present

## 2018-04-21 DIAGNOSIS — D509 Iron deficiency anemia, unspecified: Secondary | ICD-10-CM | POA: Diagnosis not present

## 2018-04-21 DIAGNOSIS — N186 End stage renal disease: Secondary | ICD-10-CM | POA: Diagnosis not present

## 2018-04-21 DIAGNOSIS — M3210 Systemic lupus erythematosus, organ or system involvement unspecified: Secondary | ICD-10-CM | POA: Diagnosis not present

## 2018-04-21 DIAGNOSIS — N2581 Secondary hyperparathyroidism of renal origin: Secondary | ICD-10-CM | POA: Diagnosis not present

## 2018-04-23 DIAGNOSIS — N2581 Secondary hyperparathyroidism of renal origin: Secondary | ICD-10-CM | POA: Diagnosis not present

## 2018-04-23 DIAGNOSIS — D631 Anemia in chronic kidney disease: Secondary | ICD-10-CM | POA: Diagnosis not present

## 2018-04-23 DIAGNOSIS — M3210 Systemic lupus erythematosus, organ or system involvement unspecified: Secondary | ICD-10-CM | POA: Diagnosis not present

## 2018-04-23 DIAGNOSIS — D509 Iron deficiency anemia, unspecified: Secondary | ICD-10-CM | POA: Diagnosis not present

## 2018-04-23 DIAGNOSIS — N186 End stage renal disease: Secondary | ICD-10-CM | POA: Diagnosis not present

## 2018-04-25 ENCOUNTER — Other Ambulatory Visit: Payer: Self-pay

## 2018-04-25 ENCOUNTER — Encounter: Payer: Self-pay | Admitting: Vascular Surgery

## 2018-04-25 ENCOUNTER — Ambulatory Visit (INDEPENDENT_AMBULATORY_CARE_PROVIDER_SITE_OTHER): Payer: Self-pay | Admitting: Vascular Surgery

## 2018-04-25 VITALS — BP 154/90 | HR 77 | Temp 97.6°F | Resp 20 | Ht 66.0 in | Wt 139.0 lb

## 2018-04-25 DIAGNOSIS — N186 End stage renal disease: Secondary | ICD-10-CM

## 2018-04-25 DIAGNOSIS — D509 Iron deficiency anemia, unspecified: Secondary | ICD-10-CM | POA: Diagnosis not present

## 2018-04-25 DIAGNOSIS — Z992 Dependence on renal dialysis: Secondary | ICD-10-CM

## 2018-04-25 DIAGNOSIS — M3210 Systemic lupus erythematosus, organ or system involvement unspecified: Secondary | ICD-10-CM | POA: Diagnosis not present

## 2018-04-25 DIAGNOSIS — D631 Anemia in chronic kidney disease: Secondary | ICD-10-CM | POA: Diagnosis not present

## 2018-04-25 DIAGNOSIS — N2581 Secondary hyperparathyroidism of renal origin: Secondary | ICD-10-CM | POA: Diagnosis not present

## 2018-04-25 NOTE — Progress Notes (Signed)
    Subjective:     Patient ID: [name], female   DOB: 1960/12/14, 57 y.o.   MRN: [id number]  HPI [name] follows up after revision right arm AV fistula and subsequent hematoma evacuation.  She is now doing well.  Currently dialyzing via catheter.  Not having any issues with the right arm.   Review of Systems No complaints today    Objective:   Physical Exam Awake alert oriented Well-healed right upper extremity incision Strong thrill right arm AV fistula    Assessment:      57 year old female follows up after placement of catheter revision of fistula.  This time appears to be ready for use    Plan:     Okay to begin fistula use and catheter can likely be removed next week.  [name] C. Donzetta Matters, MD [facility] [place] Office: [phone] Pager: [phone]

## 2018-04-28 DIAGNOSIS — N186 End stage renal disease: Secondary | ICD-10-CM | POA: Diagnosis not present

## 2018-04-28 DIAGNOSIS — N2581 Secondary hyperparathyroidism of renal origin: Secondary | ICD-10-CM | POA: Diagnosis not present

## 2018-04-28 DIAGNOSIS — D631 Anemia in chronic kidney disease: Secondary | ICD-10-CM | POA: Diagnosis not present

## 2018-04-28 DIAGNOSIS — M3210 Systemic lupus erythematosus, organ or system involvement unspecified: Secondary | ICD-10-CM | POA: Diagnosis not present

## 2018-04-28 DIAGNOSIS — D509 Iron deficiency anemia, unspecified: Secondary | ICD-10-CM | POA: Diagnosis not present

## 2018-04-30 DIAGNOSIS — D631 Anemia in chronic kidney disease: Secondary | ICD-10-CM | POA: Diagnosis not present

## 2018-04-30 DIAGNOSIS — D509 Iron deficiency anemia, unspecified: Secondary | ICD-10-CM | POA: Diagnosis not present

## 2018-04-30 DIAGNOSIS — N2581 Secondary hyperparathyroidism of renal origin: Secondary | ICD-10-CM | POA: Diagnosis not present

## 2018-04-30 DIAGNOSIS — N186 End stage renal disease: Secondary | ICD-10-CM | POA: Diagnosis not present

## 2018-04-30 DIAGNOSIS — M3210 Systemic lupus erythematosus, organ or system involvement unspecified: Secondary | ICD-10-CM | POA: Diagnosis not present

## 2018-05-02 DIAGNOSIS — D509 Iron deficiency anemia, unspecified: Secondary | ICD-10-CM | POA: Diagnosis not present

## 2018-05-02 DIAGNOSIS — D631 Anemia in chronic kidney disease: Secondary | ICD-10-CM | POA: Diagnosis not present

## 2018-05-02 DIAGNOSIS — N2581 Secondary hyperparathyroidism of renal origin: Secondary | ICD-10-CM | POA: Diagnosis not present

## 2018-05-02 DIAGNOSIS — M3210 Systemic lupus erythematosus, organ or system involvement unspecified: Secondary | ICD-10-CM | POA: Diagnosis not present

## 2018-05-02 DIAGNOSIS — N186 End stage renal disease: Secondary | ICD-10-CM | POA: Diagnosis not present

## 2018-05-05 DIAGNOSIS — D509 Iron deficiency anemia, unspecified: Secondary | ICD-10-CM | POA: Diagnosis not present

## 2018-05-05 DIAGNOSIS — N186 End stage renal disease: Secondary | ICD-10-CM | POA: Diagnosis not present

## 2018-05-05 DIAGNOSIS — D631 Anemia in chronic kidney disease: Secondary | ICD-10-CM | POA: Diagnosis not present

## 2018-05-05 DIAGNOSIS — N2581 Secondary hyperparathyroidism of renal origin: Secondary | ICD-10-CM | POA: Diagnosis not present

## 2018-05-05 DIAGNOSIS — M3210 Systemic lupus erythematosus, organ or system involvement unspecified: Secondary | ICD-10-CM | POA: Diagnosis not present

## 2018-05-07 DIAGNOSIS — N2581 Secondary hyperparathyroidism of renal origin: Secondary | ICD-10-CM | POA: Diagnosis not present

## 2018-05-07 DIAGNOSIS — D509 Iron deficiency anemia, unspecified: Secondary | ICD-10-CM | POA: Diagnosis not present

## 2018-05-07 DIAGNOSIS — M3210 Systemic lupus erythematosus, organ or system involvement unspecified: Secondary | ICD-10-CM | POA: Diagnosis not present

## 2018-05-07 DIAGNOSIS — N186 End stage renal disease: Secondary | ICD-10-CM | POA: Diagnosis not present

## 2018-05-07 DIAGNOSIS — D631 Anemia in chronic kidney disease: Secondary | ICD-10-CM | POA: Diagnosis not present

## 2018-05-09 DIAGNOSIS — N186 End stage renal disease: Secondary | ICD-10-CM | POA: Diagnosis not present

## 2018-05-09 DIAGNOSIS — N2581 Secondary hyperparathyroidism of renal origin: Secondary | ICD-10-CM | POA: Diagnosis not present

## 2018-05-09 DIAGNOSIS — D509 Iron deficiency anemia, unspecified: Secondary | ICD-10-CM | POA: Diagnosis not present

## 2018-05-09 DIAGNOSIS — D631 Anemia in chronic kidney disease: Secondary | ICD-10-CM | POA: Diagnosis not present

## 2018-05-09 DIAGNOSIS — M3210 Systemic lupus erythematosus, organ or system involvement unspecified: Secondary | ICD-10-CM | POA: Diagnosis not present

## 2018-05-11 DIAGNOSIS — N2581 Secondary hyperparathyroidism of renal origin: Secondary | ICD-10-CM | POA: Diagnosis not present

## 2018-05-11 DIAGNOSIS — M3210 Systemic lupus erythematosus, organ or system involvement unspecified: Secondary | ICD-10-CM | POA: Diagnosis not present

## 2018-05-11 DIAGNOSIS — D631 Anemia in chronic kidney disease: Secondary | ICD-10-CM | POA: Diagnosis not present

## 2018-05-11 DIAGNOSIS — D509 Iron deficiency anemia, unspecified: Secondary | ICD-10-CM | POA: Diagnosis not present

## 2018-05-11 DIAGNOSIS — N186 End stage renal disease: Secondary | ICD-10-CM | POA: Diagnosis not present

## 2018-05-13 DIAGNOSIS — M3210 Systemic lupus erythematosus, organ or system involvement unspecified: Secondary | ICD-10-CM | POA: Diagnosis not present

## 2018-05-13 DIAGNOSIS — N2581 Secondary hyperparathyroidism of renal origin: Secondary | ICD-10-CM | POA: Diagnosis not present

## 2018-05-13 DIAGNOSIS — N186 End stage renal disease: Secondary | ICD-10-CM | POA: Diagnosis not present

## 2018-05-13 DIAGNOSIS — D631 Anemia in chronic kidney disease: Secondary | ICD-10-CM | POA: Diagnosis not present

## 2018-05-13 DIAGNOSIS — D509 Iron deficiency anemia, unspecified: Secondary | ICD-10-CM | POA: Diagnosis not present

## 2018-05-16 DIAGNOSIS — N2581 Secondary hyperparathyroidism of renal origin: Secondary | ICD-10-CM | POA: Diagnosis not present

## 2018-05-16 DIAGNOSIS — M3210 Systemic lupus erythematosus, organ or system involvement unspecified: Secondary | ICD-10-CM | POA: Diagnosis not present

## 2018-05-16 DIAGNOSIS — D631 Anemia in chronic kidney disease: Secondary | ICD-10-CM | POA: Diagnosis not present

## 2018-05-16 DIAGNOSIS — D509 Iron deficiency anemia, unspecified: Secondary | ICD-10-CM | POA: Diagnosis not present

## 2018-05-16 DIAGNOSIS — N186 End stage renal disease: Secondary | ICD-10-CM | POA: Diagnosis not present

## 2018-05-18 DIAGNOSIS — N186 End stage renal disease: Secondary | ICD-10-CM | POA: Diagnosis not present

## 2018-05-18 DIAGNOSIS — N2581 Secondary hyperparathyroidism of renal origin: Secondary | ICD-10-CM | POA: Diagnosis not present

## 2018-05-18 DIAGNOSIS — D509 Iron deficiency anemia, unspecified: Secondary | ICD-10-CM | POA: Diagnosis not present

## 2018-05-18 DIAGNOSIS — D631 Anemia in chronic kidney disease: Secondary | ICD-10-CM | POA: Diagnosis not present

## 2018-05-18 DIAGNOSIS — M3210 Systemic lupus erythematosus, organ or system involvement unspecified: Secondary | ICD-10-CM | POA: Diagnosis not present

## 2018-05-20 DIAGNOSIS — N2581 Secondary hyperparathyroidism of renal origin: Secondary | ICD-10-CM | POA: Diagnosis not present

## 2018-05-20 DIAGNOSIS — M3210 Systemic lupus erythematosus, organ or system involvement unspecified: Secondary | ICD-10-CM | POA: Diagnosis not present

## 2018-05-20 DIAGNOSIS — D631 Anemia in chronic kidney disease: Secondary | ICD-10-CM | POA: Diagnosis not present

## 2018-05-20 DIAGNOSIS — N186 End stage renal disease: Secondary | ICD-10-CM | POA: Diagnosis not present

## 2018-05-20 DIAGNOSIS — D509 Iron deficiency anemia, unspecified: Secondary | ICD-10-CM | POA: Diagnosis not present

## 2018-05-21 DIAGNOSIS — I120 Hypertensive chronic kidney disease with stage 5 chronic kidney disease or end stage renal disease: Secondary | ICD-10-CM | POA: Diagnosis not present

## 2018-05-21 DIAGNOSIS — Z992 Dependence on renal dialysis: Secondary | ICD-10-CM | POA: Diagnosis not present

## 2018-05-21 DIAGNOSIS — N186 End stage renal disease: Secondary | ICD-10-CM | POA: Diagnosis not present

## 2018-05-23 DIAGNOSIS — M3210 Systemic lupus erythematosus, organ or system involvement unspecified: Secondary | ICD-10-CM | POA: Diagnosis not present

## 2018-05-23 DIAGNOSIS — D631 Anemia in chronic kidney disease: Secondary | ICD-10-CM | POA: Diagnosis not present

## 2018-05-23 DIAGNOSIS — N186 End stage renal disease: Secondary | ICD-10-CM | POA: Diagnosis not present

## 2018-05-23 DIAGNOSIS — D509 Iron deficiency anemia, unspecified: Secondary | ICD-10-CM | POA: Diagnosis not present

## 2018-05-23 DIAGNOSIS — N2581 Secondary hyperparathyroidism of renal origin: Secondary | ICD-10-CM | POA: Diagnosis not present

## 2018-05-26 DIAGNOSIS — M3210 Systemic lupus erythematosus, organ or system involvement unspecified: Secondary | ICD-10-CM | POA: Diagnosis not present

## 2018-05-26 DIAGNOSIS — D509 Iron deficiency anemia, unspecified: Secondary | ICD-10-CM | POA: Diagnosis not present

## 2018-05-26 DIAGNOSIS — N186 End stage renal disease: Secondary | ICD-10-CM | POA: Diagnosis not present

## 2018-05-26 DIAGNOSIS — N2581 Secondary hyperparathyroidism of renal origin: Secondary | ICD-10-CM | POA: Diagnosis not present

## 2018-05-26 DIAGNOSIS — D631 Anemia in chronic kidney disease: Secondary | ICD-10-CM | POA: Diagnosis not present

## 2018-05-27 DIAGNOSIS — Z452 Encounter for adjustment and management of vascular access device: Secondary | ICD-10-CM | POA: Diagnosis not present

## 2018-05-28 DIAGNOSIS — D631 Anemia in chronic kidney disease: Secondary | ICD-10-CM | POA: Diagnosis not present

## 2018-05-28 DIAGNOSIS — N186 End stage renal disease: Secondary | ICD-10-CM | POA: Diagnosis not present

## 2018-05-28 DIAGNOSIS — M3210 Systemic lupus erythematosus, organ or system involvement unspecified: Secondary | ICD-10-CM | POA: Diagnosis not present

## 2018-05-28 DIAGNOSIS — N2581 Secondary hyperparathyroidism of renal origin: Secondary | ICD-10-CM | POA: Diagnosis not present

## 2018-05-28 DIAGNOSIS — D509 Iron deficiency anemia, unspecified: Secondary | ICD-10-CM | POA: Diagnosis not present

## 2018-05-30 DIAGNOSIS — M3210 Systemic lupus erythematosus, organ or system involvement unspecified: Secondary | ICD-10-CM | POA: Diagnosis not present

## 2018-05-30 DIAGNOSIS — N2581 Secondary hyperparathyroidism of renal origin: Secondary | ICD-10-CM | POA: Diagnosis not present

## 2018-05-30 DIAGNOSIS — D509 Iron deficiency anemia, unspecified: Secondary | ICD-10-CM | POA: Diagnosis not present

## 2018-05-30 DIAGNOSIS — N186 End stage renal disease: Secondary | ICD-10-CM | POA: Diagnosis not present

## 2018-05-30 DIAGNOSIS — D631 Anemia in chronic kidney disease: Secondary | ICD-10-CM | POA: Diagnosis not present

## 2018-06-02 DIAGNOSIS — D631 Anemia in chronic kidney disease: Secondary | ICD-10-CM | POA: Diagnosis not present

## 2018-06-02 DIAGNOSIS — N186 End stage renal disease: Secondary | ICD-10-CM | POA: Diagnosis not present

## 2018-06-02 DIAGNOSIS — N2581 Secondary hyperparathyroidism of renal origin: Secondary | ICD-10-CM | POA: Diagnosis not present

## 2018-06-02 DIAGNOSIS — D509 Iron deficiency anemia, unspecified: Secondary | ICD-10-CM | POA: Diagnosis not present

## 2018-06-02 DIAGNOSIS — M3210 Systemic lupus erythematosus, organ or system involvement unspecified: Secondary | ICD-10-CM | POA: Diagnosis not present

## 2018-06-04 DIAGNOSIS — M3210 Systemic lupus erythematosus, organ or system involvement unspecified: Secondary | ICD-10-CM | POA: Diagnosis not present

## 2018-06-04 DIAGNOSIS — D631 Anemia in chronic kidney disease: Secondary | ICD-10-CM | POA: Diagnosis not present

## 2018-06-04 DIAGNOSIS — D509 Iron deficiency anemia, unspecified: Secondary | ICD-10-CM | POA: Diagnosis not present

## 2018-06-04 DIAGNOSIS — N2581 Secondary hyperparathyroidism of renal origin: Secondary | ICD-10-CM | POA: Diagnosis not present

## 2018-06-04 DIAGNOSIS — N186 End stage renal disease: Secondary | ICD-10-CM | POA: Diagnosis not present

## 2018-06-06 DIAGNOSIS — D509 Iron deficiency anemia, unspecified: Secondary | ICD-10-CM | POA: Diagnosis not present

## 2018-06-06 DIAGNOSIS — N2581 Secondary hyperparathyroidism of renal origin: Secondary | ICD-10-CM | POA: Diagnosis not present

## 2018-06-06 DIAGNOSIS — D631 Anemia in chronic kidney disease: Secondary | ICD-10-CM | POA: Diagnosis not present

## 2018-06-06 DIAGNOSIS — N186 End stage renal disease: Secondary | ICD-10-CM | POA: Diagnosis not present

## 2018-06-06 DIAGNOSIS — M3210 Systemic lupus erythematosus, organ or system involvement unspecified: Secondary | ICD-10-CM | POA: Diagnosis not present

## 2018-06-09 DIAGNOSIS — N186 End stage renal disease: Secondary | ICD-10-CM | POA: Diagnosis not present

## 2018-06-09 DIAGNOSIS — N2581 Secondary hyperparathyroidism of renal origin: Secondary | ICD-10-CM | POA: Diagnosis not present

## 2018-06-09 DIAGNOSIS — D631 Anemia in chronic kidney disease: Secondary | ICD-10-CM | POA: Diagnosis not present

## 2018-06-09 DIAGNOSIS — M3210 Systemic lupus erythematosus, organ or system involvement unspecified: Secondary | ICD-10-CM | POA: Diagnosis not present

## 2018-06-09 DIAGNOSIS — D509 Iron deficiency anemia, unspecified: Secondary | ICD-10-CM | POA: Diagnosis not present

## 2018-06-11 DIAGNOSIS — D509 Iron deficiency anemia, unspecified: Secondary | ICD-10-CM | POA: Diagnosis not present

## 2018-06-11 DIAGNOSIS — D631 Anemia in chronic kidney disease: Secondary | ICD-10-CM | POA: Diagnosis not present

## 2018-06-11 DIAGNOSIS — M3210 Systemic lupus erythematosus, organ or system involvement unspecified: Secondary | ICD-10-CM | POA: Diagnosis not present

## 2018-06-11 DIAGNOSIS — N2581 Secondary hyperparathyroidism of renal origin: Secondary | ICD-10-CM | POA: Diagnosis not present

## 2018-06-11 DIAGNOSIS — N186 End stage renal disease: Secondary | ICD-10-CM | POA: Diagnosis not present

## 2018-06-13 DIAGNOSIS — N2581 Secondary hyperparathyroidism of renal origin: Secondary | ICD-10-CM | POA: Diagnosis not present

## 2018-06-13 DIAGNOSIS — D631 Anemia in chronic kidney disease: Secondary | ICD-10-CM | POA: Diagnosis not present

## 2018-06-13 DIAGNOSIS — D509 Iron deficiency anemia, unspecified: Secondary | ICD-10-CM | POA: Diagnosis not present

## 2018-06-13 DIAGNOSIS — N186 End stage renal disease: Secondary | ICD-10-CM | POA: Diagnosis not present

## 2018-06-13 DIAGNOSIS — M3210 Systemic lupus erythematosus, organ or system involvement unspecified: Secondary | ICD-10-CM | POA: Diagnosis not present

## 2018-06-16 DIAGNOSIS — N2581 Secondary hyperparathyroidism of renal origin: Secondary | ICD-10-CM | POA: Diagnosis not present

## 2018-06-16 DIAGNOSIS — M3210 Systemic lupus erythematosus, organ or system involvement unspecified: Secondary | ICD-10-CM | POA: Diagnosis not present

## 2018-06-16 DIAGNOSIS — D509 Iron deficiency anemia, unspecified: Secondary | ICD-10-CM | POA: Diagnosis not present

## 2018-06-16 DIAGNOSIS — N186 End stage renal disease: Secondary | ICD-10-CM | POA: Diagnosis not present

## 2018-06-16 DIAGNOSIS — D631 Anemia in chronic kidney disease: Secondary | ICD-10-CM | POA: Diagnosis not present

## 2018-06-18 DIAGNOSIS — M3210 Systemic lupus erythematosus, organ or system involvement unspecified: Secondary | ICD-10-CM | POA: Diagnosis not present

## 2018-06-18 DIAGNOSIS — D631 Anemia in chronic kidney disease: Secondary | ICD-10-CM | POA: Diagnosis not present

## 2018-06-18 DIAGNOSIS — D509 Iron deficiency anemia, unspecified: Secondary | ICD-10-CM | POA: Diagnosis not present

## 2018-06-18 DIAGNOSIS — N186 End stage renal disease: Secondary | ICD-10-CM | POA: Diagnosis not present

## 2018-06-18 DIAGNOSIS — N2581 Secondary hyperparathyroidism of renal origin: Secondary | ICD-10-CM | POA: Diagnosis not present

## 2018-06-20 DIAGNOSIS — D631 Anemia in chronic kidney disease: Secondary | ICD-10-CM | POA: Diagnosis not present

## 2018-06-20 DIAGNOSIS — D509 Iron deficiency anemia, unspecified: Secondary | ICD-10-CM | POA: Diagnosis not present

## 2018-06-20 DIAGNOSIS — N186 End stage renal disease: Secondary | ICD-10-CM | POA: Diagnosis not present

## 2018-06-20 DIAGNOSIS — N2581 Secondary hyperparathyroidism of renal origin: Secondary | ICD-10-CM | POA: Diagnosis not present

## 2018-06-20 DIAGNOSIS — M3210 Systemic lupus erythematosus, organ or system involvement unspecified: Secondary | ICD-10-CM | POA: Diagnosis not present

## 2018-06-21 DIAGNOSIS — Z992 Dependence on renal dialysis: Secondary | ICD-10-CM | POA: Diagnosis not present

## 2018-06-21 DIAGNOSIS — I120 Hypertensive chronic kidney disease with stage 5 chronic kidney disease or end stage renal disease: Secondary | ICD-10-CM | POA: Diagnosis not present

## 2018-06-21 DIAGNOSIS — N186 End stage renal disease: Secondary | ICD-10-CM | POA: Diagnosis not present

## 2018-06-23 DIAGNOSIS — N2581 Secondary hyperparathyroidism of renal origin: Secondary | ICD-10-CM | POA: Diagnosis not present

## 2018-06-23 DIAGNOSIS — D509 Iron deficiency anemia, unspecified: Secondary | ICD-10-CM | POA: Diagnosis not present

## 2018-06-23 DIAGNOSIS — D631 Anemia in chronic kidney disease: Secondary | ICD-10-CM | POA: Diagnosis not present

## 2018-06-23 DIAGNOSIS — N186 End stage renal disease: Secondary | ICD-10-CM | POA: Diagnosis not present

## 2018-06-23 DIAGNOSIS — M3210 Systemic lupus erythematosus, organ or system involvement unspecified: Secondary | ICD-10-CM | POA: Diagnosis not present

## 2018-06-25 DIAGNOSIS — N186 End stage renal disease: Secondary | ICD-10-CM | POA: Diagnosis not present

## 2018-06-25 DIAGNOSIS — N2581 Secondary hyperparathyroidism of renal origin: Secondary | ICD-10-CM | POA: Diagnosis not present

## 2018-06-25 DIAGNOSIS — M3210 Systemic lupus erythematosus, organ or system involvement unspecified: Secondary | ICD-10-CM | POA: Diagnosis not present

## 2018-06-25 DIAGNOSIS — D631 Anemia in chronic kidney disease: Secondary | ICD-10-CM | POA: Diagnosis not present

## 2018-06-25 DIAGNOSIS — D509 Iron deficiency anemia, unspecified: Secondary | ICD-10-CM | POA: Diagnosis not present

## 2018-06-26 DIAGNOSIS — M542 Cervicalgia: Secondary | ICD-10-CM | POA: Diagnosis not present

## 2018-06-27 DIAGNOSIS — M3210 Systemic lupus erythematosus, organ or system involvement unspecified: Secondary | ICD-10-CM | POA: Diagnosis not present

## 2018-06-27 DIAGNOSIS — D509 Iron deficiency anemia, unspecified: Secondary | ICD-10-CM | POA: Diagnosis not present

## 2018-06-27 DIAGNOSIS — D631 Anemia in chronic kidney disease: Secondary | ICD-10-CM | POA: Diagnosis not present

## 2018-06-27 DIAGNOSIS — N186 End stage renal disease: Secondary | ICD-10-CM | POA: Diagnosis not present

## 2018-06-27 DIAGNOSIS — N2581 Secondary hyperparathyroidism of renal origin: Secondary | ICD-10-CM | POA: Diagnosis not present

## 2018-06-30 DIAGNOSIS — D631 Anemia in chronic kidney disease: Secondary | ICD-10-CM | POA: Diagnosis not present

## 2018-06-30 DIAGNOSIS — N186 End stage renal disease: Secondary | ICD-10-CM | POA: Diagnosis not present

## 2018-06-30 DIAGNOSIS — M3210 Systemic lupus erythematosus, organ or system involvement unspecified: Secondary | ICD-10-CM | POA: Diagnosis not present

## 2018-06-30 DIAGNOSIS — D509 Iron deficiency anemia, unspecified: Secondary | ICD-10-CM | POA: Diagnosis not present

## 2018-06-30 DIAGNOSIS — N2581 Secondary hyperparathyroidism of renal origin: Secondary | ICD-10-CM | POA: Diagnosis not present

## 2018-07-02 DIAGNOSIS — M3210 Systemic lupus erythematosus, organ or system involvement unspecified: Secondary | ICD-10-CM | POA: Diagnosis not present

## 2018-07-02 DIAGNOSIS — N186 End stage renal disease: Secondary | ICD-10-CM | POA: Diagnosis not present

## 2018-07-02 DIAGNOSIS — D509 Iron deficiency anemia, unspecified: Secondary | ICD-10-CM | POA: Diagnosis not present

## 2018-07-02 DIAGNOSIS — D631 Anemia in chronic kidney disease: Secondary | ICD-10-CM | POA: Diagnosis not present

## 2018-07-02 DIAGNOSIS — N2581 Secondary hyperparathyroidism of renal origin: Secondary | ICD-10-CM | POA: Diagnosis not present

## 2018-07-03 ENCOUNTER — Encounter: Payer: Self-pay | Admitting: Internal Medicine

## 2018-07-03 ENCOUNTER — Ambulatory Visit (HOSPITAL_COMMUNITY): Payer: Medicare Other | Attending: Cardiology

## 2018-07-03 ENCOUNTER — Ambulatory Visit (INDEPENDENT_AMBULATORY_CARE_PROVIDER_SITE_OTHER): Payer: Medicare Other | Admitting: Internal Medicine

## 2018-07-03 VITALS — BP 136/64 | HR 75 | Ht 66.0 in | Wt 136.1 lb

## 2018-07-03 DIAGNOSIS — I350 Nonrheumatic aortic (valve) stenosis: Secondary | ICD-10-CM | POA: Diagnosis not present

## 2018-07-03 DIAGNOSIS — I10 Essential (primary) hypertension: Secondary | ICD-10-CM | POA: Diagnosis not present

## 2018-07-03 DIAGNOSIS — I050 Rheumatic mitral stenosis: Secondary | ICD-10-CM

## 2018-07-03 NOTE — Progress Notes (Signed)
Cardiology Office Note:    Date:  07/03/2018   ID:  [name], DOB August 29, 1960, MRN [id number]  PCP:  Benito Mccreedy, MD  Cardiologist:  Dorris Carnes, MD   Referring MD: Benito Mccreedy, MD    Pt presents for f/u of valvular heart disease    History of Present Illness:    [name] is a 58 y.o. female with hx of CVA, HTN, ESRD on dialysis MWF, Lupus, dysfunctional uterine bleeding, PUD, aortic stenosis and mitral stenosis, anemia.  She was admitted with resp distres, volume overload 6/3-6/7 which was managed with hemodialysis.She was in rapid atrial fib    Her blood pressure was elevated upon presentation.  Did admit to missing some of her meds prior to admit Her Lisinopril was DC'd 2/2 cough.  Her Labetalol dose was increased.    She was seen by Kathleen Argue in follow up in June  Doing OK Echo was done in June  2019 LVEF wsa normal   THe aortic valve was mild to moderately narrowed  Mean gradient was 20 mm Hg  Gradients across MV were 25 and 17 mm Hg   This was increased from previous echo   Valve appeared to be severely narrowed    I saw the pt in clinic in Oct 2019  Since seen she continues with dialysis   No change in run times  She says no problems with these sessions The pt  denies CP   Breathing is stable  She is not too active   She denies dizziness Echo done today    Prior CV studies:   The following studies were reviewed today: ve.  Echo 07/24/16 Mod conc LVH, EF 60-65, no RWMA, Gr 2 DDd, bicuspid aortic valve, mild to mod AS (mean 18, peak 38), MAC, mod mitral stenosis (mean 9, peak 19), mild to mod MR, severe LAE, normal RVSF, mild RAE, mild TR  Myoview 4/16 EF 59, no ischemia  Past Medical History:  Diagnosis Date  . Anemia   . Aortic stenosis 09/25/2016   Echo 07/24/16: Mod conc LVH, EF 60-65, no RWMA, Gr 2 DDd, bicuspid aortic valve, mild to mod AS (mean 18, peak 38), MAC, mod mitral stenosis (mean 9, peak 19), mild to mod MR, severe LAE, normal RVSF, mild  RAE, mild TR  . Arthritis    "joints" (10/23/2017)  . Blood transfusion '08   [facility]; "low HgB" (10/23/2017)  . Chronic diastolic CHF (congestive heart failure) ([place])   . Dysfunctional uterine bleeding 12/19/2010  . ESRD (end stage renal disease) on dialysis [facility])    "MWF; [name]." (10/23/2017)  . GERD (gastroesophageal reflux disease)   . Headache   . Heart murmur   . Hemodialysis patient [facility])    right extremity port  . History of hiatal hernia   . Hx of cardiovascular stress test    Lexiscan Myoview 4/16:  Normal stress nuclear study, EF 59%  . Hypertension   . Lupus ([place])    "? kind" (10/23/2017)  . Mitral stenosis    Echo 4/16:  EF 55-60%, no RWMA, Gr 1 DD, mod MS (mean 9 mmHg), mod LAE, mild RAE, PASP 65, mod to severe TR, trivial eff // Echo 6/19:  Mild LVH, EF 55-60, no RWMA, Gr 2 DD, mild to mod AS (Mean 20), severe MS (mean 17), massive LAE, PASP 48, trivial effusion   . Peptic ulcer disease   . Pneumonia 10/21/2017  . PONV (postoperative nausea and vomiting)   .  Stroke [facility])    per patient "they said i had a small stroke but i couldnt even tell"   Surgical Hx: The patient  has a past surgical history that includes Dialysis fistula creation (2007); Esophagogastroduodenoscopy (N/A, 07/31/2014); Endometrial ablation; Shoulder open rotator cuff repair (Right, 10/09/2016); Colonoscopy w/ biopsies and polypectomy; Revison of arteriovenous fistula (Right, 09/26/2017); Insertion of arteriovenous (av) artegraft arm (Right, 09/26/2017); TEE without cardioversion (N/A, 01/02/2018); Fistulogram (Right, 02/25/2018); Revison of arteriovenous fistula (Right, 02/25/2018); AV fistula placement (Right, 02/25/2018); Hematoma evacuation (Right, 03/06/2018); and Insertion of dialysis catheter (Right, 03/06/2018).   Current Medications: Current Meds  Medication Sig  . acetaminophen (TYLENOL) 500 MG tablet Take 1,000 mg by mouth every 6 (six) hours as needed for headache (pain).   [name] amLODipine (NORVASC) 10 MG  tablet Take 1 tablet (10 mg total) by mouth at bedtime.  [name] aspirin EC 81 MG tablet Take 81 mg by mouth at bedtime.  . calcitRIOL (ROCALTROL) 0.25 MCG capsule Take 7 capsules (1.75 mcg total) by mouth every other day. (Patient taking differently: Take 1.75 mcg by mouth every Monday, Wednesday, and Friday with hemodialysis. )  . cloNIDine (CATAPRES) 0.2 MG tablet Take 1 tablet (0.2 mg total) by mouth See admin instructions. Take 1 tablet (0.2 mg) by mouth twice daily - on dialysis days take after dialysis and at bedtime, on other days take one in the morning and one at night.  . ferric citrate (AURYXIA) 1 GM 210 MG(Fe) tablet Take 210-420 mg by mouth See admin instructions. Take 2 tablets (420 mg) by mouth up to 3 times daily with meals, take 1 tablet (210 mg) with snacks  . labetalol (NORMODYNE) 300 MG tablet Take 2 tablets (600 mg total) by mouth See admin instructions. Take 2 tablets (600 mg) by mouth three times daily on Sunday, Tuesday, Thursday, Saturday (non-dialysis days), take 2 tablets (600 mg) twice daily on Monday, Wednesday, Friday - after dialysis and at bedtime  . lanthanum (FOSRENOL) 1000 MG chewable tablet Chew 2 tablets (2,000 mg total) by mouth 3 (three) times daily with meals. (Patient taking differently: Chew 1,000-2,000 mg by mouth See admin instructions. Chew 2 tablets (2000 mg) by mouth up to 3 times daily with meals, chew 1 tablet (1000 mg) with snacks)  . multivitamin (RENA-VIT) TABS tablet Take 1 tablet by mouth at bedtime.   . ondansetron (ZOFRAN) 4 MG tablet Take 4 mg by mouth every 6 (six) hours as needed for nausea or vomiting.  [name] oxyCODONE-acetaminophen (PERCOCET) 7.5-325 MG tablet Take 1 tablet by mouth every 6 (six) hours as needed for severe pain.  . pantoprazole (PROTONIX) 40 MG tablet Take 40 mg by mouth 2 (two) times daily.   . predniSONE (DELTASONE) 10 MG tablet prednisone 10 mg tablet  take 1 tab three times a day for 2 days, 1 tab twice a day for 5 days, 1 tab daily  till finished     Allergies:   Other   Social History   Tobacco Use  . Smoking status: Current Some Day Smoker    Packs/day: 0.10    Years: 10.00    Pack years: 1.00    Types: Cigarettes    Start date: 10/21/2017  . Smokeless tobacco: Never Used  . Tobacco comment: 3 cigs a week  Substance Use Topics  . Alcohol use: Yes    Alcohol/week: 1.0 standard drinks    Types: 1 Cans of beer per week    Comment: occasional beer  . Drug use:  Yes    Types: Marijuana, Cocaine    Comment: 03/04/18- marijuana- this weekend     Family Hx: The patient's family history includes Hypertension in her father and mother; Liver cancer in her maternal grandmother; Lymphoma in her maternal aunt; Renal Disease in her father. There is no history of Heart attack.   EKGs/Labs/Other Test Reviewed:    EKG:  EKG is not  ordered today.   Recent Labs: 10/24/2017: Magnesium 1.9 03/14/2018: ALT 7 03/15/2018: BUN 27; Creatinine, Ser 4.81; Hemoglobin 7.9; Platelets 203; Potassium 4.4; Sodium 138   Recent Lipid Panel Lab Results  Component Value Date/Time   CHOL 187 02/23/2016 10:40 AM   TRIG 89 02/23/2016 10:40 AM   HDL 55 02/23/2016 10:40 AM   CHOLHDL 3.4 02/23/2016 10:40 AM   LDLCALC 114 02/23/2016 10:40 AM    Physical Exam:    VS:  BP 136/64   Pulse 75   Ht _0  (1.676 m)   Wt 136 lb 1.9 oz (61.7 kg)   LMP 12/05/2010 (LMP Unknown)   SpO2 99%   BMI 21.97 kg/m     Wt Readings from Last 3 Encounters:  07/03/18 136 lb 1.9 oz (61.7 kg)  04/25/18 139 lb (63 kg)  03/21/18 135 lb (61.2 kg)    PE   PT is a thin 58 yo in  NAD  HEENT:   NCAT Neck   JVP is not elevated    No bruits Lungs are rel clear   Cardiac exam:   RRR Normal S1, S2  No S3   Gr III/VI systoliic  Gr I/VI diastolic murmur  At apex  Abd   Soft   nontender  No hepatomegaly   Ext Trivial  edema     ASSESSMENT & PLAN:     1  Valvular heart disease  I have reviewed echo today   Gradients are significanty increased from echo  done last year.   MV appears severely narrowed.  Mean gradient of 20     AV is prob mod to severely narrowed with mean gradient in 30s    LVEF is normal   There are high filling pressures I have reviewed with pt    She does have some SOB with activity I will review with colleagues   Would recomm R and L heart cath to define further, evaulate for valve replacement    Risks/ benefits of cath described   Pt understands and agrees to proceed.    2. HTN   BP is OK today   Keep on same meds      3  ESRD needing dialysis [facility]) She remains on Monday, Wednesday, Friday dialysis.  4  Aortic atherosclerosis (HCC)   Continue aspirin. She is not on a statin currently      Medication Adjustments/Labs and Tests Ordered: Current medicines are reviewed at length with the patient today.  Concerns regarding medicines are outlined above.  Tests Ordered: No orders of the defined types were placed in this encounter.  Medication Changes: No orders of the defined types were placed in this encounter.   Signed, Dorris Carnes, MD  07/03/2018 11:18 AM    [address], [place], [place]  [address] Phone: [phone]; Fax: [phone]

## 2018-07-03 NOTE — Patient Instructions (Signed)
Medication Instructions:  The current medical regimen is effective;  continue present plan and medications.  If you need a refill on your cardiac medications before your next appointment, please call your pharmacy.   Lab work: None today If you have labs (blood work) drawn today and your tests are completely normal, you will receive your results only by: [name] MyChart Message (if you have MyChart) OR . A paper copy in the mail If you have any lab test that is abnormal or we need to change your treatment, we will call you to review the results.  Testing/Procedures: Your physician has requested that you have a cardiac catheterization. Cardiac catheterization is used to diagnose and/or treat various heart conditions. Doctors may recommend this procedure for a number of different reasons. The most common reason is to evaluate chest pain. Chest pain can be a symptom of coronary artery disease (CAD), and cardiac catheterization can show whether plaque is narrowing or blocking your heart's arteries. This procedure is also used to evaluate the valves, as well as measure the blood flow and oxygen levels in different parts of your heart. For further information please visit [email or web address]. Please follow instruction sheet, as given.  Follow-Up: You will be contacted about scheduling your cardiac cath and follow up appointment with Dr Harrington Challenger.  Thank you for choosing [facility]!!

## 2018-07-03 NOTE — H&P (View-Only) (Signed)
Cardiology Office Note:    Date:  07/03/2018   ID:  [name], DOB 02/05/1961, MRN [id number]  PCP:  Osei-Bonsu, George, MD  Cardiologist:  Corinn Stoltzfus, MD   Referring MD: Osei-Bonsu, George, MD    Pt presents for f/u of valvular heart disease    History of Present Illness:    [name] is a 57 y.o. female with hx of CVA, HTN, ESRD on dialysis MWF, Lupus, dysfunctional uterine bleeding, PUD, aortic stenosis and mitral stenosis, anemia.  She was admitted with resp distres, volume overload 6/3-6/7 which was managed with hemodialysis.She was in rapid atrial fib    Her blood pressure was elevated upon presentation.  Did admit to missing some of her meds prior to admit Her Lisinopril was DC'd 2/2 cough.  Her Labetalol dose was increased.    She was seen by S Weaver in follow up in June  Doing OK Echo was done in June  2019 LVEF wsa normal   THe aortic valve was mild to moderately narrowed  Mean gradient was 20 mm Hg  Gradients across MV were 25 and 17 mm Hg   This was increased from previous echo   Valve appeared to be severely narrowed    I saw the pt in clinic in Oct 2019  Since seen she continues with dialysis   No change in run times  She says no problems with these sessions The pt  denies CP   Breathing is stable  She is not too active   She denies dizziness Echo done today    Prior CV studies:   The following studies were reviewed today: ve.  Echo 07/24/16 Mod conc LVH, EF 60-65, no RWMA, Gr 2 DDd, bicuspid aortic valve, mild to mod AS (mean 18, peak 38), MAC, mod mitral stenosis (mean 9, peak 19), mild to mod MR, severe LAE, normal RVSF, mild RAE, mild TR  Myoview 4/16 EF 59, no ischemia  Past Medical History:  Diagnosis Date  . Anemia   . Aortic stenosis 09/25/2016   Echo 07/24/16: Mod conc LVH, EF 60-65, no RWMA, Gr 2 DDd, bicuspid aortic valve, mild to mod AS (mean 18, peak 38), MAC, mod mitral stenosis (mean 9, peak 19), mild to mod MR, severe LAE, normal RVSF, mild  RAE, mild TR  . Arthritis    "joints" (10/23/2017)  . Blood transfusion '08   [facility]; "low HgB" (10/23/2017)  . Chronic diastolic CHF (congestive heart failure) (HCC)   . Dysfunctional uterine bleeding 12/19/2010  . ESRD (end stage renal disease) on dialysis ([facility])    "MWF; [address] (10/23/2017)  . GERD (gastroesophageal reflux disease)   . Headache   . Heart murmur   . Hemodialysis patient ([facility])    right extremity port  . History of hiatal hernia   . Hx of cardiovascular stress test    Lexiscan Myoview 4/16:  Normal stress nuclear study, EF 59%  . Hypertension   . Lupus (HCC)    "? kind" (10/23/2017)  . Mitral stenosis    Echo 4/16:  EF 55-60%, no RWMA, Gr 1 DD, mod MS (mean 9 mmHg), mod LAE, mild RAE, PASP 65, mod to severe TR, trivial eff // Echo 6/19:  Mild LVH, EF 55-60, no RWMA, Gr 2 DD, mild to mod AS (Mean 20), severe MS (mean 17), massive LAE, PASP 48, trivial effusion   . Peptic ulcer disease   . Pneumonia 10/21/2017  . PONV (postoperative nausea and vomiting)   .   Stroke [facility])    per patient "they said i had a small stroke but i couldnt even tell"   Surgical Hx: The patient  has a past surgical history that includes Dialysis fistula creation (2007); Esophagogastroduodenoscopy (N/A, 07/31/2014); Endometrial ablation; Shoulder open rotator cuff repair (Right, 10/09/2016); Colonoscopy w/ biopsies and polypectomy; Revison of arteriovenous fistula (Right, 09/26/2017); Insertion of arteriovenous (av) artegraft arm (Right, 09/26/2017); TEE without cardioversion (N/A, 01/02/2018); Fistulogram (Right, 02/25/2018); Revison of arteriovenous fistula (Right, 02/25/2018); AV fistula placement (Right, 02/25/2018); Hematoma evacuation (Right, 03/06/2018); and Insertion of dialysis catheter (Right, 03/06/2018).   Current Medications: Current Meds  Medication Sig  . acetaminophen (TYLENOL) 500 MG tablet Take 1,000 mg by mouth every 6 (six) hours as needed for headache (pain).   [name] amLODipine (NORVASC) 10 MG  tablet Take 1 tablet (10 mg total) by mouth at bedtime.  [name] aspirin EC 81 MG tablet Take 81 mg by mouth at bedtime.  . calcitRIOL (ROCALTROL) 0.25 MCG capsule Take 7 capsules (1.75 mcg total) by mouth every other day. (Patient taking differently: Take 1.75 mcg by mouth every Monday, Wednesday, and Friday with hemodialysis. )  . cloNIDine (CATAPRES) 0.2 MG tablet Take 1 tablet (0.2 mg total) by mouth See admin instructions. Take 1 tablet (0.2 mg) by mouth twice daily - on dialysis days take after dialysis and at bedtime, on other days take one in the morning and one at night.  . ferric citrate (AURYXIA) 1 GM 210 MG(Fe) tablet Take 210-420 mg by mouth See admin instructions. Take 2 tablets (420 mg) by mouth up to 3 times daily with meals, take 1 tablet (210 mg) with snacks  . labetalol (NORMODYNE) 300 MG tablet Take 2 tablets (600 mg total) by mouth See admin instructions. Take 2 tablets (600 mg) by mouth three times daily on Sunday, Tuesday, Thursday, Saturday (non-dialysis days), take 2 tablets (600 mg) twice daily on Monday, Wednesday, Friday - after dialysis and at bedtime  . lanthanum (FOSRENOL) 1000 MG chewable tablet Chew 2 tablets (2,000 mg total) by mouth 3 (three) times daily with meals. (Patient taking differently: Chew 1,000-2,000 mg by mouth See admin instructions. Chew 2 tablets (2000 mg) by mouth up to 3 times daily with meals, chew 1 tablet (1000 mg) with snacks)  . multivitamin (RENA-VIT) TABS tablet Take 1 tablet by mouth at bedtime.   . ondansetron (ZOFRAN) 4 MG tablet Take 4 mg by mouth every 6 (six) hours as needed for nausea or vomiting.  [name] oxyCODONE-acetaminophen (PERCOCET) 7.5-325 MG tablet Take 1 tablet by mouth every 6 (six) hours as needed for severe pain.  . pantoprazole (PROTONIX) 40 MG tablet Take 40 mg by mouth 2 (two) times daily.   . predniSONE (DELTASONE) 10 MG tablet prednisone 10 mg tablet  take 1 tab three times a day for 2 days, 1 tab twice a day for 5 days, 1 tab daily  till finished     Allergies:   Other   Social History   Tobacco Use  . Smoking status: Current Some Day Smoker    Packs/day: 0.10    Years: 10.00    Pack years: 1.00    Types: Cigarettes    Start date: 10/21/2017  . Smokeless tobacco: Never Used  . Tobacco comment: 3 cigs a week  Substance Use Topics  . Alcohol use: Yes    Alcohol/week: 1.0 standard drinks    Types: 1 Cans of beer per week    Comment: occasional beer  . Drug use:  Yes    Types: Marijuana, Cocaine    Comment: 03/04/18- marijuana- this weekend     Family Hx: The patient's family history includes Hypertension in her father and mother; Liver cancer in her maternal grandmother; Lymphoma in her maternal aunt; Renal Disease in her father. There is no history of Heart attack.   EKGs/Labs/Other Test Reviewed:    EKG:  EKG is not  ordered today.   Recent Labs: 10/24/2017: Magnesium 1.9 03/14/2018: ALT 7 03/15/2018: BUN 27; Creatinine, Ser 4.81; Hemoglobin 7.9; Platelets 203; Potassium 4.4; Sodium 138   Recent Lipid Panel Lab Results  Component Value Date/Time   CHOL 187 02/23/2016 10:40 AM   TRIG 89 02/23/2016 10:40 AM   HDL 55 02/23/2016 10:40 AM   CHOLHDL 3.4 02/23/2016 10:40 AM   LDLCALC 114 02/23/2016 10:40 AM    Physical Exam:    VS:  BP 136/64   Pulse 75   Ht _0  (1.676 m)   Wt 136 lb 1.9 oz (61.7 kg)   LMP 12/05/2010 (LMP Unknown)   SpO2 99%   BMI 21.97 kg/m     Wt Readings from Last 3 Encounters:  07/03/18 136 lb 1.9 oz (61.7 kg)  04/25/18 139 lb (63 kg)  03/21/18 135 lb (61.2 kg)    PE   PT is a thin 58 yo in  NAD  HEENT:   NCAT Neck   JVP is not elevated    No bruits Lungs are rel clear   Cardiac exam:   RRR Normal S1, S2  No S3   Gr III/VI systoliic  Gr I/VI diastolic murmur  At apex  Abd   Soft   nontender  No hepatomegaly   Ext Trivial  edema     ASSESSMENT & PLAN:     1  Valvular heart disease  I have reviewed echo today   Gradients are significanty increased from echo  done last year.   MV appears severely narrowed.  Mean gradient of 20     AV is prob mod to severely narrowed with mean gradient in 30s    LVEF is normal   There are high filling pressures I have reviewed with pt    She does have some SOB with activity I will review with colleagues   Would recomm R and L heart cath to define further, evaulate for valve replacement    Risks/ benefits of cath described   Pt understands and agrees to proceed.    2. HTN   BP is OK today   Keep on same meds      3  ESRD needing dialysis [facility]) She remains on Monday, Wednesday, Friday dialysis.  4  Aortic atherosclerosis (HCC)   Continue aspirin. She is not on a statin currently      Medication Adjustments/Labs and Tests Ordered: Current medicines are reviewed at length with the patient today.  Concerns regarding medicines are outlined above.  Tests Ordered: No orders of the defined types were placed in this encounter.  Medication Changes: No orders of the defined types were placed in this encounter.   Signed, Dorris Carnes, MD  07/03/2018 11:18 AM    [address], [place], [place]  [address] Phone: [phone]; Fax: [phone]

## 2018-07-04 DIAGNOSIS — D631 Anemia in chronic kidney disease: Secondary | ICD-10-CM | POA: Diagnosis not present

## 2018-07-04 DIAGNOSIS — D509 Iron deficiency anemia, unspecified: Secondary | ICD-10-CM | POA: Diagnosis not present

## 2018-07-04 DIAGNOSIS — N186 End stage renal disease: Secondary | ICD-10-CM | POA: Diagnosis not present

## 2018-07-04 DIAGNOSIS — M3210 Systemic lupus erythematosus, organ or system involvement unspecified: Secondary | ICD-10-CM | POA: Diagnosis not present

## 2018-07-04 DIAGNOSIS — N2581 Secondary hyperparathyroidism of renal origin: Secondary | ICD-10-CM | POA: Diagnosis not present

## 2018-07-07 ENCOUNTER — Telehealth (HOSPITAL_COMMUNITY): Payer: Self-pay | Admitting: Cardiology

## 2018-07-07 DIAGNOSIS — D509 Iron deficiency anemia, unspecified: Secondary | ICD-10-CM | POA: Diagnosis not present

## 2018-07-07 DIAGNOSIS — N2581 Secondary hyperparathyroidism of renal origin: Secondary | ICD-10-CM | POA: Diagnosis not present

## 2018-07-07 DIAGNOSIS — N186 End stage renal disease: Secondary | ICD-10-CM | POA: Diagnosis not present

## 2018-07-07 DIAGNOSIS — I5032 Chronic diastolic (congestive) heart failure: Principal | ICD-10-CM

## 2018-07-07 DIAGNOSIS — M3210 Systemic lupus erythematosus, organ or system involvement unspecified: Secondary | ICD-10-CM | POA: Diagnosis not present

## 2018-07-07 DIAGNOSIS — D631 Anemia in chronic kidney disease: Secondary | ICD-10-CM | POA: Diagnosis not present

## 2018-07-07 DIAGNOSIS — I110 Hypertensive heart disease with heart failure: Secondary | ICD-10-CM

## 2018-07-07 NOTE — Telephone Encounter (Signed)
-----   Message from Larey Dresser, MD sent at 07/07/2018 10:22 AM EST ----- This patient will need LHC/RHC with me whenever it can be scheduled.  She will then need a followup with me after cath.  Thanks. ----- Message ----- From: Fay Records, MD Sent: 07/06/2018  10:57 PM EST To: Larey Dresser, MD  Kirk Ruths, Ms Ringold is the pt I told you about with miitral and aortic stenosis.  MV severe  AV mod-severe  Recomm R and L heart cath

## 2018-07-07 NOTE — Telephone Encounter (Signed)
Attempting to arrange cath Left message for patient to call back with scheduling preferences.   [facility] available 2/21, 2/25, 2/26, 3/3, 3/6

## 2018-07-08 NOTE — Progress Notes (Signed)
Chantel attempted to reach pt on 2/17. (see below)  Kerry Dory, [place]     07/07/18 11:48 AM  Note    Attempting to arrange cath Left message for patient to call back with scheduling preferences.   [facility] available 2/21, 2/25, 2/26, 3/3, 3/6

## 2018-07-09 DIAGNOSIS — D631 Anemia in chronic kidney disease: Secondary | ICD-10-CM | POA: Diagnosis not present

## 2018-07-09 DIAGNOSIS — N186 End stage renal disease: Secondary | ICD-10-CM | POA: Diagnosis not present

## 2018-07-09 DIAGNOSIS — M3210 Systemic lupus erythematosus, organ or system involvement unspecified: Secondary | ICD-10-CM | POA: Diagnosis not present

## 2018-07-09 DIAGNOSIS — N2581 Secondary hyperparathyroidism of renal origin: Secondary | ICD-10-CM | POA: Diagnosis not present

## 2018-07-09 DIAGNOSIS — D509 Iron deficiency anemia, unspecified: Secondary | ICD-10-CM | POA: Diagnosis not present

## 2018-07-11 ENCOUNTER — Encounter (HOSPITAL_COMMUNITY): Payer: Self-pay | Admitting: Cardiology

## 2018-07-11 DIAGNOSIS — D631 Anemia in chronic kidney disease: Secondary | ICD-10-CM | POA: Diagnosis not present

## 2018-07-11 DIAGNOSIS — D509 Iron deficiency anemia, unspecified: Secondary | ICD-10-CM | POA: Diagnosis not present

## 2018-07-11 DIAGNOSIS — N2581 Secondary hyperparathyroidism of renal origin: Secondary | ICD-10-CM | POA: Diagnosis not present

## 2018-07-11 DIAGNOSIS — M3210 Systemic lupus erythematosus, organ or system involvement unspecified: Secondary | ICD-10-CM | POA: Diagnosis not present

## 2018-07-11 DIAGNOSIS — N186 End stage renal disease: Secondary | ICD-10-CM | POA: Diagnosis not present

## 2018-07-11 NOTE — Telephone Encounter (Signed)
RESCHEDULED FOR 3/5 PT AWARE AND VOICED UNDERSTANDING PRE CATH LABS 3/3 CATH LETTER AND LAB APPT CARD MAILED TO PATIENT    @[email or web address] @ [facility] [id number] [place] [address] Dept: [phone] Loc: [phone]  [name]  07/11/2018  You are scheduled for a Cardiac Catheterization on Thursday, March 5 with Dr. Loralie Champagne.  1. Please arrive at the [facility] (Main Entrance A) at [facility]: [address] [place], [place] [address] at 5:30 AM (This time is two hours before your procedure to ensure your preparation). Free valet parking service is available.   Special note: Every effort is made to have your procedure done on time. Please understand that emergencies sometimes delay scheduled procedures.  2. Diet: Do not eat solid foods after midnight.  The patient may have clear liquids until 5am upon the day of the procedure.  3. Labs: You will need to have blood drawn on Tuesday, March 3 at [facility]. Parking [id number] You do not need to be fasting.  4. Medication instructions in preparation for your procedure:   Contrast Allergy: No    On the morning of your procedure, take your Aspirin and any morning medicines NOT listed above.  You may use sips of water.  5. Plan for one night stay--bring personal belongings. 6. Bring a current list of your medications and current insurance cards. 7. You MUST have a responsible person to drive you home. 8. Someone MUST be with you the first 24 hours after you arrive home or your discharge will be delayed. 9. Please wear clothes that are easy to get on and off and wear slip-on shoes.  Thank you for allowing [place] to care for you!   -- [facility]

## 2018-07-11 NOTE — Telephone Encounter (Signed)
R/l cath scheduled for 3/3 @ 12 Patient arrival @ 10 Patient aware and voiced understanding     @LOGO @ [facility] [id number] [place] [address] Dept: [phone] Loc: [phone]  Lenette Rau  07/11/2018  You are scheduled for a Cardiac Catheterization on Tuesday, March 3 with Dr. Loralie Champagne.  1. Please arrive at the [name] (Main Entrance A) at [facility]: [address] [place], [place] [address] at 10:00 AM (This time is two hours before your procedure to ensure your preparation). Free valet parking service is available.   Special note: Every effort is made to have your procedure done on time. Please understand that emergencies sometimes delay scheduled procedures.  2. Diet: Do not eat solid foods after midnight.  The patient may have clear liquids until 5am upon the day of the procedure.  3. Labs: You will need to have blood drawn on Thursday, February 27 at [facility].Parking Code [phone] You do not need to be fasting.  4. Medication instructions in preparation for your procedure:   Contrast Allergy: No    On the morning of your procedure, take your Aspirin and any morning medicines NOT listed above.  You may use sips of water.  5. Plan for one night stay--bring personal belongings. 6. Bring a current list of your medications and current insurance cards. 7. You MUST have a responsible person to drive you home. 8. Someone MUST be with you the first 24 hours after you arrive home or your discharge will be delayed. 9. Please wear clothes that are easy to get on and off and wear slip-on shoes.  Thank you for allowing [place] to care for you!   -- [place] [facility]

## 2018-07-14 ENCOUNTER — Encounter (HOSPITAL_COMMUNITY): Payer: Self-pay

## 2018-07-14 ENCOUNTER — Other Ambulatory Visit (HOSPITAL_COMMUNITY): Payer: Self-pay

## 2018-07-14 ENCOUNTER — Emergency Department (HOSPITAL_COMMUNITY)
Admission: EM | Admit: 2018-07-14 | Discharge: 2018-07-14 | Disposition: A | Discharge status: Home / Self Care | Payer: Medicare Other | Attending: Emergency Medicine | Admitting: Emergency Medicine

## 2018-07-14 ENCOUNTER — Other Ambulatory Visit: Payer: Self-pay

## 2018-07-14 DIAGNOSIS — Z8673 Personal history of transient ischemic attack (TIA), and cerebral infarction without residual deficits: Secondary | ICD-10-CM | POA: Diagnosis not present

## 2018-07-14 DIAGNOSIS — Z992 Dependence on renal dialysis: Secondary | ICD-10-CM | POA: Insufficient documentation

## 2018-07-14 DIAGNOSIS — I132 Hypertensive heart and chronic kidney disease with heart failure and with stage 5 chronic kidney disease, or end stage renal disease: Secondary | ICD-10-CM | POA: Diagnosis not present

## 2018-07-14 DIAGNOSIS — D631 Anemia in chronic kidney disease: Secondary | ICD-10-CM | POA: Diagnosis not present

## 2018-07-14 DIAGNOSIS — I5032 Chronic diastolic (congestive) heart failure: Secondary | ICD-10-CM | POA: Insufficient documentation

## 2018-07-14 DIAGNOSIS — M3210 Systemic lupus erythematosus, organ or system involvement unspecified: Secondary | ICD-10-CM | POA: Diagnosis not present

## 2018-07-14 DIAGNOSIS — R05 Cough: Secondary | ICD-10-CM | POA: Diagnosis present

## 2018-07-14 DIAGNOSIS — I5022 Chronic systolic (congestive) heart failure: Secondary | ICD-10-CM

## 2018-07-14 DIAGNOSIS — N186 End stage renal disease: Secondary | ICD-10-CM | POA: Insufficient documentation

## 2018-07-14 DIAGNOSIS — J209 Acute bronchitis, unspecified: Secondary | ICD-10-CM | POA: Diagnosis not present

## 2018-07-14 DIAGNOSIS — D509 Iron deficiency anemia, unspecified: Secondary | ICD-10-CM | POA: Diagnosis not present

## 2018-07-14 DIAGNOSIS — Z79899 Other long term (current) drug therapy: Secondary | ICD-10-CM | POA: Insufficient documentation

## 2018-07-14 DIAGNOSIS — F17210 Nicotine dependence, cigarettes, uncomplicated: Secondary | ICD-10-CM | POA: Insufficient documentation

## 2018-07-14 DIAGNOSIS — N2581 Secondary hyperparathyroidism of renal origin: Secondary | ICD-10-CM | POA: Diagnosis not present

## 2018-07-14 DIAGNOSIS — Z7982 Long term (current) use of aspirin: Secondary | ICD-10-CM | POA: Insufficient documentation

## 2018-07-14 DIAGNOSIS — I120 Hypertensive chronic kidney disease with stage 5 chronic kidney disease or end stage renal disease: Secondary | ICD-10-CM | POA: Diagnosis not present

## 2018-07-14 MED ORDER — AZITHROMYCIN 250 MG PO TABS: 250.0000 mg | ORAL_TABLET | Freq: Every day | ORAL | 0 refills | Status: DC

## 2018-07-14 NOTE — ED Triage Notes (Signed)
Pt reports flu like symptoms for the past 3 weeks now, no fever, taking robitussin OTC without relief of symptoms. Pt has dialysis MWF, last tx was today, compliant with schedule. Pt a.o, nad noted.

## 2018-07-14 NOTE — ED Provider Notes (Signed)
[facility] EMERGENCY DEPARTMENT Provider Note   CSN: [id number] Arrival date & time: 07/14/18  1516    History   Chief Complaint Chief Complaint  Patient presents with  . Cough  . Nasal Congestion    HPI [name] is a 58 y.o. female.     Patient is a 58 year old female with past medical history of lupus, end-stage renal disease on hemodialysis.  She presents today for evaluation of cough and congestion.  This is been worsening over the past 2-1/2 weeks.  She is tried over-the-counter medications with little relief.  Patient denies any ill contacts.  She denies any fevers or chills.  She denies any chest pain.  Her cough has been intermittently productive of yellow mucus.  The history is provided by the patient.  Cough  Cough characteristics:  Productive Sputum characteristics:  Yellow Severity:  Moderate Onset quality:  Gradual Duration:  3 weeks Timing:  Constant Progression:  Worsening Chronicity:  New Smoker: no   Relieved by:  Nothing Worsened by:  Nothing Ineffective treatments:  Decongestant and beta-agonist inhaler   Past Medical History:  Diagnosis Date  . Anemia   . Aortic stenosis 09/25/2016   Echo 07/24/16: Mod conc LVH, EF 60-65, no RWMA, Gr 2 DDd, bicuspid aortic valve, mild to mod AS (mean 18, peak 38), MAC, mod mitral stenosis (mean 9, peak 19), mild to mod MR, severe LAE, normal RVSF, mild RAE, mild TR  . Arthritis    "joints" (10/23/2017)  . Blood transfusion '08   [facility]; "low HgB" (10/23/2017)  . Chronic diastolic CHF (congestive heart failure) ([place])   . Dysfunctional uterine bleeding 12/19/2010  . ESRD (end stage renal disease) on dialysis [facility])    "MWF; [name]." (10/23/2017)  . GERD (gastroesophageal reflux disease)   . Headache   . Heart murmur   . Hemodialysis patient [facility])    right extremity port  . History of hiatal hernia   . Hx of cardiovascular stress test    Lexiscan Myoview 4/16:  Normal stress nuclear study, EF 59%    . Hypertension   . Lupus ([place])    "? kind" (10/23/2017)  . Mitral stenosis    Echo 4/16:  EF 55-60%, no RWMA, Gr 1 DD, mod MS (mean 9 mmHg), mod LAE, mild RAE, PASP 65, mod to severe TR, trivial eff // Echo 6/19:  Mild LVH, EF 55-60, no RWMA, Gr 2 DD, mild to mod AS (Mean 20), severe MS (mean 17), massive LAE, PASP 48, trivial effusion   . Peptic ulcer disease   . Pneumonia 10/21/2017  . PONV (postoperative nausea and vomiting)   . Stroke [facility])    per patient "they said i had a small stroke but i couldnt even tell"    Patient Active Problem List   Diagnosis Date Noted  . Symptomatic anemia 03/14/2018  . Hypokalemia 03/14/2018  . Chronic pain 03/14/2018  . GERD (gastroesophageal reflux disease) 03/14/2018  . ESRD needing dialysis ([place])   . Volume overload 10/21/2017  . HCAP (healthcare-associated pneumonia) 10/21/2017  . Acute respiratory failure with hypoxia ([place]) 10/21/2017  . Fluid overload 01/26/2017  . HTN (hypertension) 01/26/2017  . Rotator cuff tear arthropathy, right 10/09/2016  . Aortic stenosis 09/25/2016  . History of sepsis 05/01/2015  . Hypoxia 05/01/2015  . Mitral stenosis 08/30/2014  . End-stage renal disease on hemodialysis ([place]) 07/25/2014  . History of pulmonary edema 07/25/2014  . Lupus (systemic lupus erythematosus) ([place]) 07/25/2014  . DUB (dysfunctional  uterine bleeding) 07/25/2014  . Anemia 07/25/2014  . Peptic ulcer disease 07/25/2014  . History of stroke 07/25/2014  . Hypertensive heart disease with CHF (congestive heart failure) ([place]) 12/19/2010  . Dialysis care 12/19/2010    Past Surgical History:  Procedure Laterality Date  . AV FISTULA PLACEMENT Right 02/25/2018   Procedure: INSERTION OF 71m x 16cm ARTEGRAFT;  Surgeon: CWaynetta Sandy MD;  [place]  Service: Vascular;  Laterality: Right;  . COLONOSCOPY W/ BIOPSIES AND POLYPECTOMY    . DIALYSIS FISTULA CREATION  2007  . ENDOMETRIAL ABLATION    . ESOPHAGOGASTRODUODENOSCOPY N/A  07/31/2014   Procedure: ESOPHAGOGASTRODUODENOSCOPY (EGD);  Surgeon: MClarene Essex MD;  [facility]  Service: Endoscopy;  Laterality: N/A;  . FISTULOGRAM Right 02/25/2018   Procedure: FISTULOGRAM ARM;  Surgeon: CWaynetta Sandy MD;  [place] [facility]  Service: Vascular;  Laterality: Right;  . HEMATOMA EVACUATION Right 03/06/2018   Procedure: EVACUATION HEMATOMA RIGHT UPPER ARM;  Surgeon: CWaynetta Sandy MD;  [facility]  Service: Vascular;  Laterality: Right;  . INSERTION OF ARTERIOVENOUS (AV) ARTEGRAFT ARM Right 09/26/2017   Procedure: INSERTION OF ARTERIOVENOUS (AV) ARTEGRAFT INTO RIGHT ARM;  Surgeon: CWaynetta Sandy MD;  [place] [facility]  Service: Vascular;  Laterality: Right;  . INSERTION OF DIALYSIS CATHETER Right 03/06/2018   Procedure: INSERTION OF DIALYSIS CATHETER, right internal jugular;  Surgeon: CWaynetta Sandy MD;  [place] [facility]  Service: Vascular;  Laterality: Right;  . REVISON OF ARTERIOVENOUS FISTULA Right 09/26/2017   Procedure: REVISION OF ARTERIOVENOUS FISTULA RIGHT ARM WITH ARTEGRAFT;  Surgeon: CWaynetta Sandy MD;  [place] [facility]  Service: Vascular;  Laterality: Right;  . REVISON OF ARTERIOVENOUS FISTULA Right 02/25/2018   Procedure: REVISION OF ARTERIOVENOUS FISTULA;  Surgeon: CWaynetta Sandy MD;  [place]  Service: Vascular;  Laterality: Right;  . SHOULDER OPEN ROTATOR CUFF REPAIR Right 10/09/2016   Procedure: ROTATOR CUFF REPAIR SHOULDER OPEN partial acrominectomy and extensive synovectomy;  Surgeon: GLatanya [name] MD;  [facility];  Service: Orthopedics;  Laterality: Right;  RNFA  . TEE WITHOUT CARDIOVERSION N/A 01/02/2018   Procedure: TRANSESOPHAGEAL ECHOCARDIOGRAM (TEE) Bubble Study;  Surgeon: MLarey Dresser MD;  [facility];  Service: Cardiovascular;  Laterality: N/A;     OB History   No obstetric history on file.      Home Medications    Prior to Admission  medications   Medication Sig Start Date End Date Taking? Authorizing Provider  acetaminophen (TYLENOL) 500 MG tablet Take 1,000 mg by mouth every 6 (six) hours as needed for headache (pain).     [provider]  amLODipine (NORVASC) 10 MG tablet Take 1 tablet (10 mg total) by mouth at bedtime. 03/15/18   ERoxan Hockey MD  aspirin EC 81 MG tablet Take 81 mg by mouth at bedtime.    [provider]  calcitRIOL (ROCALTROL) 0.25 MCG capsule Take 7 capsules (1.75 mcg total) by mouth every other day. Patient taking differently: Take 1.75 mcg by mouth every Monday, Wednesday, and Friday with hemodialysis.  10/27/17   DPhillips Grout MD  cloNIDine (CATAPRES) 0.2 MG tablet Take 1 tablet (0.2 mg total) by mouth See admin instructions. Take 1 tablet (0.2 mg) by mouth twice daily - on dialysis days take after dialysis and at bedtime, on other days take one in the morning and one at night. 03/15/18   ERoxan Hockey MD  ferric citrate (AURYXIA) 1 GM 210 MG(Fe) tablet Take  210-420 mg by mouth See admin instructions. Take 2 tablets (420 mg) by mouth up to 3 times daily with meals, take 1 tablet (210 mg) with snacks    [provider]  labetalol (NORMODYNE) 300 MG tablet Take 2 tablets (600 mg total) by mouth See admin instructions. Take 2 tablets (600 mg) by mouth three times daily on Sunday, Tuesday, Thursday, Saturday (non-dialysis days), take 2 tablets (600 mg) twice daily on Monday, Wednesday, Friday - after dialysis and at bedtime 03/15/18   Roxan Hockey, MD  lanthanum (FOSRENOL) 1000 MG chewable tablet Chew 2 tablets (2,000 mg total) by mouth 3 (three) times daily with meals. Patient taking differently: Chew 1,000-2,000 mg by mouth See admin instructions. Chew 2 tablets (2000 mg) by mouth up to 3 times daily with meals, chew 1 tablet (1000 mg) with snacks 05/05/15   Eugenie Filler, MD  multivitamin (RENA-VIT) TABS tablet Take 1 tablet by mouth at bedtime.     [provider]  ondansetron (ZOFRAN) 4 MG tablet Take 4 mg by mouth every 6 (six) hours as needed for nausea or vomiting.    [provider]  oxyCODONE-acetaminophen (PERCOCET) 7.5-325 MG tablet Take 1 tablet by mouth every 6 (six) hours as needed for severe pain. 03/06/18   Rhyne, Hulen Shouts, PA-C  pantoprazole (PROTONIX) 40 MG tablet Take 40 mg by mouth 2 (two) times daily.     [provider]  predniSONE (DELTASONE) 10 MG tablet prednisone 10 mg tablet  take 1 tab three times a day for 2 days, 1 tab twice a day for 5 days, 1 tab daily till finished    [provider]    Family History Family History  Problem Relation Age of Onset  . Liver cancer Maternal Grandmother   . Lymphoma Maternal Aunt   . Hypertension Mother   . Renal Disease Father   . Hypertension Father   . Heart attack Neg Hx     Social History Social History   Tobacco Use  . Smoking status: Current Some Day Smoker    Packs/day: 0.10    Years: 10.00    Pack years: 1.00    Types: Cigarettes    Start date: 10/21/2017  . Smokeless tobacco: Never Used  . Tobacco comment: 3 cigs a week  Substance Use Topics  . Alcohol use: Yes    Alcohol/week: 1.0 standard drinks    Types: 1 Cans of beer per week    Comment: occasional beer  . Drug use: Yes    Types: Marijuana, Cocaine    Comment: 03/04/18- marijuana- this weekend     Allergies   Other   Review of Systems Review of Systems  Respiratory: Positive for cough.   All other systems reviewed and are negative.    Physical Exam Updated Vital Signs BP (!) 149/70 (BP Location: Right Arm)   Pulse 98   Temp 98.3 F (36.8 C) (Oral)   Resp 18   LMP 12/05/2010 (LMP Unknown)   SpO2 96%   Physical Exam Vitals signs and nursing note reviewed.  Constitutional:      General: She is not in acute distress.    Appearance: She is well-developed. She is not diaphoretic.  HENT:     Head: Normocephalic and atraumatic.  Neck:      Musculoskeletal: Normal range of motion and neck supple.  Cardiovascular:     Rate and Rhythm: Normal rate and regular rhythm.     Heart sounds: Murmur present.  No friction rub. No gallop.   Pulmonary:     Effort: Pulmonary effort is normal. No respiratory distress.     Breath sounds: Normal breath sounds. No wheezing.  Abdominal:     General: Bowel sounds are normal. There is no distension.     Palpations: Abdomen is soft.     Tenderness: There is no abdominal tenderness.  Musculoskeletal: Normal range of motion.  Skin:    General: Skin is warm and dry.  Neurological:     Mental Status: She is alert and oriented to person, place, and time.      ED Treatments / Results  Labs (all labs ordered are listed, but only abnormal results are displayed) Labs Reviewed - No data to display  EKG None  Radiology No results found.  Procedures Procedures (including critical care time)  Medications Ordered in ED Medications - No data to display   Initial Impression / Assessment and Plan / ED Course  I have reviewed the triage vital signs and the nursing notes.  Pertinent labs & imaging results that were available during my care of the patient were reviewed by me and considered in my medical decision making (see chart for details).  Patient presenting with persistent URI symptoms for the past 2-1/2 weeks.  She has tried over-the-counter medications with little relief.  Her lungs are clear and there is no hypoxia.  Due to the prolonged nature of performance, patient will be prescribed antibiotics and is to follow-up as needed.  Final Clinical Impressions(s) / ED Diagnoses   Final diagnoses:  None    ED Discharge Orders    None       Veryl Speak, MD 07/14/18 1544

## 2018-07-14 NOTE — Discharge Instructions (Signed)
Zithromax as prescribed.  Continue over-the-counter medications as needed for relief of symptoms.  Return to the emergency department if you develop severe chest pain, worsening breathing, or other new and concerning symptoms.

## 2018-07-14 NOTE — Progress Notes (Signed)
Sign and held orders placed for cath 3/5

## 2018-07-14 NOTE — ED Notes (Signed)
RN was informed it was ok to send 2 visitors back

## 2018-07-16 DIAGNOSIS — N186 End stage renal disease: Secondary | ICD-10-CM | POA: Diagnosis not present

## 2018-07-16 DIAGNOSIS — M3210 Systemic lupus erythematosus, organ or system involvement unspecified: Secondary | ICD-10-CM | POA: Diagnosis not present

## 2018-07-16 DIAGNOSIS — N2581 Secondary hyperparathyroidism of renal origin: Secondary | ICD-10-CM | POA: Diagnosis not present

## 2018-07-16 DIAGNOSIS — D631 Anemia in chronic kidney disease: Secondary | ICD-10-CM | POA: Diagnosis not present

## 2018-07-16 DIAGNOSIS — D509 Iron deficiency anemia, unspecified: Secondary | ICD-10-CM | POA: Diagnosis not present

## 2018-07-18 DIAGNOSIS — N2581 Secondary hyperparathyroidism of renal origin: Secondary | ICD-10-CM | POA: Diagnosis not present

## 2018-07-18 DIAGNOSIS — N186 End stage renal disease: Secondary | ICD-10-CM | POA: Diagnosis not present

## 2018-07-18 DIAGNOSIS — D631 Anemia in chronic kidney disease: Secondary | ICD-10-CM | POA: Diagnosis not present

## 2018-07-18 DIAGNOSIS — D509 Iron deficiency anemia, unspecified: Secondary | ICD-10-CM | POA: Diagnosis not present

## 2018-07-18 DIAGNOSIS — M3210 Systemic lupus erythematosus, organ or system involvement unspecified: Secondary | ICD-10-CM | POA: Diagnosis not present

## 2018-07-20 DIAGNOSIS — Z992 Dependence on renal dialysis: Secondary | ICD-10-CM | POA: Diagnosis not present

## 2018-07-20 DIAGNOSIS — I120 Hypertensive chronic kidney disease with stage 5 chronic kidney disease or end stage renal disease: Secondary | ICD-10-CM | POA: Diagnosis not present

## 2018-07-20 DIAGNOSIS — N186 End stage renal disease: Secondary | ICD-10-CM | POA: Diagnosis not present

## 2018-07-21 DIAGNOSIS — M3210 Systemic lupus erythematosus, organ or system involvement unspecified: Secondary | ICD-10-CM | POA: Diagnosis not present

## 2018-07-21 DIAGNOSIS — N186 End stage renal disease: Secondary | ICD-10-CM | POA: Diagnosis not present

## 2018-07-21 DIAGNOSIS — D509 Iron deficiency anemia, unspecified: Secondary | ICD-10-CM | POA: Diagnosis not present

## 2018-07-21 DIAGNOSIS — N2581 Secondary hyperparathyroidism of renal origin: Secondary | ICD-10-CM | POA: Diagnosis not present

## 2018-07-21 DIAGNOSIS — D631 Anemia in chronic kidney disease: Secondary | ICD-10-CM | POA: Diagnosis not present

## 2018-07-22 ENCOUNTER — Ambulatory Visit (HOSPITAL_COMMUNITY)
Admission: RE | Admit: 2018-07-22 | Discharge: 2018-07-22 | Disposition: A | Discharge status: Home / Self Care | Payer: Medicare Other | Source: Ambulatory Visit | Attending: Cardiology | Admitting: Cardiology

## 2018-07-22 DIAGNOSIS — I110 Hypertensive heart disease with heart failure: Secondary | ICD-10-CM | POA: Insufficient documentation

## 2018-07-22 DIAGNOSIS — I5032 Chronic diastolic (congestive) heart failure: Secondary | ICD-10-CM | POA: Insufficient documentation

## 2018-07-22 LAB — BASIC METABOLIC PANEL
ANION GAP: 11 (ref 5–15)
BUN: 47 mg/dL — ABNORMAL HIGH (ref 6–20)
CO2: 28 mmol/L (ref 22–32)
Calcium: 8.4 mg/dL — ABNORMAL LOW (ref 8.9–10.3)
Chloride: 98 mmol/L (ref 98–111)
Creatinine, Ser: 6.56 mg/dL — ABNORMAL HIGH (ref 0.44–1.00)
GFR calc Af Amer: 8 mL/min — ABNORMAL LOW (ref >60)
GFR calc non Af Amer: 6 mL/min — ABNORMAL LOW (ref >60)
Glucose, Bld: 111 mg/dL — ABNORMAL HIGH (ref 70–99)
Potassium: 4.7 mmol/L (ref 3.5–5.1)
Sodium: 137 mmol/L (ref 135–145)

## 2018-07-22 LAB — CBC
HEMATOCRIT: 29 % — AB (ref 36.0–46.0)
Hemoglobin: 8.4 g/dL — ABNORMAL LOW (ref 12.0–15.0)
MCH: 28.7 pg (ref 26.0–34.0)
MCHC: 29 g/dL — ABNORMAL LOW (ref 30.0–36.0)
MCV: 99 fL (ref 80.0–100.0)
Platelets: 209 10*3/uL (ref 150–400)
RBC: 2.93 MIL/uL — ABNORMAL LOW (ref 3.87–5.11)
RDW: 19.3 % — ABNORMAL HIGH (ref 11.5–15.5)
WBC: 4.5 10*3/uL (ref 4.0–10.5)
nRBC: 0 % (ref 0.0–0.2)

## 2018-07-22 LAB — PROTIME-INR
INR: 1.1 (ref 0.8–1.2)
Prothrombin Time: 13.6 seconds (ref 11.4–15.2)

## 2018-07-23 DIAGNOSIS — N2581 Secondary hyperparathyroidism of renal origin: Secondary | ICD-10-CM | POA: Diagnosis not present

## 2018-07-23 DIAGNOSIS — M3210 Systemic lupus erythematosus, organ or system involvement unspecified: Secondary | ICD-10-CM | POA: Diagnosis not present

## 2018-07-23 DIAGNOSIS — D509 Iron deficiency anemia, unspecified: Secondary | ICD-10-CM | POA: Diagnosis not present

## 2018-07-23 DIAGNOSIS — D631 Anemia in chronic kidney disease: Secondary | ICD-10-CM | POA: Diagnosis not present

## 2018-07-23 DIAGNOSIS — N186 End stage renal disease: Secondary | ICD-10-CM | POA: Diagnosis not present

## 2018-07-24 ENCOUNTER — Other Ambulatory Visit: Payer: Self-pay

## 2018-07-24 ENCOUNTER — Ambulatory Visit (HOSPITAL_COMMUNITY)
Admission: RE | Admit: 2018-07-24 | Discharge: 2018-07-24 | Disposition: A | Discharge status: Home / Self Care | Payer: Medicare Other | Attending: Cardiology | Admitting: Cardiology

## 2018-07-24 ENCOUNTER — Encounter (HOSPITAL_COMMUNITY): Payer: Self-pay | Admitting: Cardiology

## 2018-07-24 ENCOUNTER — Encounter (HOSPITAL_COMMUNITY)
Admission: RE | Disposition: A | Discharge status: Home / Self Care | Payer: Self-pay | Source: Home / Self Care | Attending: Cardiology

## 2018-07-24 DIAGNOSIS — Z8249 Family history of ischemic heart disease and other diseases of the circulatory system: Secondary | ICD-10-CM | POA: Insufficient documentation

## 2018-07-24 DIAGNOSIS — D631 Anemia in chronic kidney disease: Secondary | ICD-10-CM | POA: Diagnosis not present

## 2018-07-24 DIAGNOSIS — I700 Atherosclerosis of aorta: Secondary | ICD-10-CM | POA: Insufficient documentation

## 2018-07-24 DIAGNOSIS — I132 Hypertensive heart and chronic kidney disease with heart failure and with stage 5 chronic kidney disease, or end stage renal disease: Secondary | ICD-10-CM | POA: Diagnosis not present

## 2018-07-24 DIAGNOSIS — Z7982 Long term (current) use of aspirin: Secondary | ICD-10-CM | POA: Diagnosis not present

## 2018-07-24 DIAGNOSIS — M329 Systemic lupus erythematosus, unspecified: Secondary | ICD-10-CM | POA: Insufficient documentation

## 2018-07-24 DIAGNOSIS — Z79899 Other long term (current) drug therapy: Secondary | ICD-10-CM | POA: Diagnosis not present

## 2018-07-24 DIAGNOSIS — F17210 Nicotine dependence, cigarettes, uncomplicated: Secondary | ICD-10-CM | POA: Insufficient documentation

## 2018-07-24 DIAGNOSIS — I080 Rheumatic disorders of both mitral and aortic valves: Secondary | ICD-10-CM | POA: Insufficient documentation

## 2018-07-24 DIAGNOSIS — Z8673 Personal history of transient ischemic attack (TIA), and cerebral infarction without residual deficits: Secondary | ICD-10-CM | POA: Diagnosis not present

## 2018-07-24 DIAGNOSIS — Z992 Dependence on renal dialysis: Secondary | ICD-10-CM | POA: Diagnosis not present

## 2018-07-24 DIAGNOSIS — K219 Gastro-esophageal reflux disease without esophagitis: Secondary | ICD-10-CM | POA: Insufficient documentation

## 2018-07-24 DIAGNOSIS — I5022 Chronic systolic (congestive) heart failure: Secondary | ICD-10-CM

## 2018-07-24 DIAGNOSIS — I350 Nonrheumatic aortic (valve) stenosis: Secondary | ICD-10-CM | POA: Diagnosis not present

## 2018-07-24 DIAGNOSIS — N186 End stage renal disease: Secondary | ICD-10-CM | POA: Diagnosis not present

## 2018-07-24 DIAGNOSIS — M1990 Unspecified osteoarthritis, unspecified site: Secondary | ICD-10-CM | POA: Diagnosis not present

## 2018-07-24 DIAGNOSIS — I5032 Chronic diastolic (congestive) heart failure: Secondary | ICD-10-CM | POA: Insufficient documentation

## 2018-07-24 DIAGNOSIS — R0602 Shortness of breath: Secondary | ICD-10-CM | POA: Diagnosis not present

## 2018-07-24 HISTORY — PX: RIGHT/LEFT HEART CATH AND CORONARY ANGIOGRAPHY: CATH118266

## 2018-07-24 LAB — POCT I-STAT EG7
Acid-Base Excess: 9 mmol/L — ABNORMAL HIGH (ref 0.0–2.0)
Acid-Base Excess: 9 mmol/L — ABNORMAL HIGH (ref 0.0–2.0)
Bicarbonate: 33.3 mmol/L — ABNORMAL HIGH (ref 20.0–28.0)
Bicarbonate: 32.8 mmol/L — ABNORMAL HIGH (ref 20.0–28.0)
CALCIUM ION: 1.04 mmol/L — AB (ref 1.15–1.40)
Calcium, Ion: 1.02 mmol/L — ABNORMAL LOW (ref 1.15–1.40)
HCT: 26 % — ABNORMAL LOW (ref 36.0–46.0)
HEMATOCRIT: 27 % — AB (ref 36.0–46.0)
Hemoglobin: 8.8 g/dL — ABNORMAL LOW (ref 12.0–15.0)
Hemoglobin: 9.2 g/dL — ABNORMAL LOW (ref 12.0–15.0)
O2 SAT: 71 %
O2 Saturation: 62 %
Potassium: 4.3 mmol/L (ref 3.5–5.1)
Potassium: 4.5 mmol/L (ref 3.5–5.1)
SODIUM: 133 mmol/L — AB (ref 135–145)
Sodium: 135 mmol/L (ref 135–145)
TCO2: 35 mmol/L — ABNORMAL HIGH (ref 22–32)
TCO2: 34 mmol/L — ABNORMAL HIGH (ref 22–32)
pCO2, Ven: 46.4 mmHg (ref 44.0–60.0)
pCO2, Ven: 43.5 mmHg — ABNORMAL LOW (ref 44.0–60.0)
pH, Ven: 7.464 — ABNORMAL HIGH (ref 7.250–7.430)
pH, Ven: 7.486 — ABNORMAL HIGH (ref 7.250–7.430)
pO2, Ven: 31 mmHg — CL (ref 32.0–45.0)
pO2, Ven: 35 mmHg (ref 32.0–45.0)

## 2018-07-24 SURGERY — RIGHT/LEFT HEART CATH AND CORONARY ANGIOGRAPHY
Anesthesia: LOCAL

## 2018-07-24 MED ORDER — MIDAZOLAM HCL 2 MG/2ML IJ SOLN
INTRAMUSCULAR | Status: DC | PRN
  Administered 2018-07-24 (×3): 1 mg via INTRAVENOUS

## 2018-07-24 MED ORDER — MIDAZOLAM HCL 2 MG/2ML IJ SOLN
INTRAMUSCULAR | Status: AC
  Filled 2018-07-24: qty 2

## 2018-07-24 MED ORDER — FENTANYL CITRATE (PF) 100 MCG/2ML IJ SOLN
INTRAMUSCULAR | Status: DC | PRN
  Administered 2018-07-24: 25 ug via INTRAVENOUS

## 2018-07-24 MED ORDER — HEPARIN (PORCINE) IN NACL 1000-0.9 UT/500ML-% IV SOLN
INTRAVENOUS | Status: DC | PRN
  Administered 2018-07-24 (×2): 500 mL

## 2018-07-24 MED ORDER — LIDOCAINE HCL (PF) 1 % IJ SOLN
INTRAMUSCULAR | Status: DC | PRN
  Administered 2018-07-24 (×2): 10 mL

## 2018-07-24 MED ORDER — MIDAZOLAM HCL 2 MG/2ML IJ SOLN
INTRAMUSCULAR | Status: DC | PRN
  Administered 2018-07-24: 1 mg via INTRAVENOUS

## 2018-07-24 MED ORDER — SODIUM CHLORIDE 0.9 % WEIGHT BASED INFUSION: 1.0000 mL/kg/h | INTRAVENOUS | Status: DC

## 2018-07-24 MED ORDER — ONDANSETRON HCL 4 MG/2ML IJ SOLN: 4.0000 mg | Freq: Four times a day (QID) | INTRAMUSCULAR | Status: DC | PRN

## 2018-07-24 MED ORDER — SODIUM CHLORIDE 0.9% FLUSH: 3.0000 mL | INTRAVENOUS | Status: DC | PRN

## 2018-07-24 MED ORDER — IOHEXOL 350 MG/ML SOLN
INTRAVENOUS | Status: DC | PRN
  Administered 2018-07-24: 50 mL via INTRA_ARTERIAL

## 2018-07-24 MED ORDER — SODIUM CHLORIDE 0.9 % IV SOLN: 250.0000 mL | INTRAVENOUS | Status: DC | PRN

## 2018-07-24 MED ORDER — ASPIRIN 81 MG PO CHEW: 81.0000 mg | CHEWABLE_TABLET | ORAL | Status: DC

## 2018-07-24 MED ORDER — FENTANYL CITRATE (PF) 100 MCG/2ML IJ SOLN
INTRAMUSCULAR | Status: DC | PRN
  Administered 2018-07-24 (×3): 25 ug via INTRAVENOUS

## 2018-07-24 MED ORDER — HEPARIN (PORCINE) IN NACL 1000-0.9 UT/500ML-% IV SOLN
INTRAVENOUS | Status: AC
  Filled 2018-07-24: qty 1000

## 2018-07-24 MED ORDER — SODIUM CHLORIDE 0.9% FLUSH: 3.0000 mL | Freq: Two times a day (BID) | INTRAVENOUS | Status: DC

## 2018-07-24 MED ORDER — FENTANYL CITRATE (PF) 100 MCG/2ML IJ SOLN
INTRAMUSCULAR | Status: AC
  Filled 2018-07-24: qty 2

## 2018-07-24 MED ORDER — ACETAMINOPHEN 325 MG PO TABS: 650.0000 mg | ORAL_TABLET | ORAL | Status: DC | PRN

## 2018-07-24 MED ORDER — SODIUM CHLORIDE 0.9 % WEIGHT BASED INFUSION
3.0000 mL/kg/h | INTRAVENOUS | Status: AC
  Administered 2018-07-24: 3 mL/kg/h via INTRAVENOUS

## 2018-07-24 MED ORDER — LIDOCAINE HCL (PF) 1 % IJ SOLN
INTRAMUSCULAR | Status: AC
  Filled 2018-07-24: qty 30

## 2018-07-24 SURGICAL SUPPLY — 17 items
CATH INFINITI 4FR 3 DRC (CATHETERS) ×1 IMPLANT
CATH INFINITI 5FR MULTPACK ANG (CATHETERS) ×1 IMPLANT
CATH SWAN GANZ 7F STRAIGHT (CATHETERS) ×1 IMPLANT
GUIDEWIRE .025 260CM (WIRE) ×1 IMPLANT
GUIDEWIRE ANGLED .035X150CM (WIRE) ×2 IMPLANT
KIT HEART LEFT (KITS) ×2 IMPLANT
PACK CARDIAC CATHETERIZATION (CUSTOM PROCEDURE TRAY) ×2 IMPLANT
SHEATH PINNACLE 5F 10CM (SHEATH) ×1 IMPLANT
SHEATH PINNACLE 7F 10CM (SHEATH) ×1 IMPLANT
SHEATH PROBE COVER 6X72 (BAG) ×1 IMPLANT
TRANSDUCER W/STOPCOCK (MISCELLANEOUS) ×3 IMPLANT
TUBING ART PRESS 72  MALE/FEM (TUBING) ×1
TUBING ART PRESS 72 MALE/FEM (TUBING) IMPLANT
TUBING CIL FLEX 10 FLL-RA (TUBING) ×2 IMPLANT
WIRE EMERALD 3MM-J .035X150CM (WIRE) ×2 IMPLANT
WIRE EMERALD 3MM-J .035X260CM (WIRE) ×1 IMPLANT
WIRE EMERALD ST .035X150CM (WIRE) ×1 IMPLANT

## 2018-07-24 NOTE — Progress Notes (Signed)
No bleeding or increased hematoma after ambulation

## 2018-07-24 NOTE — Interval H&P Note (Signed)
History and Physical Interval Note:  07/24/2018 7:45 AM  [name]  has presented today for surgery, with the diagnosis of chf  The various methods of treatment have been discussed with the patient and family. After consideration of risks, benefits and other options for treatment, the patient has consented to  Procedure(s): RIGHT/LEFT HEART CATH AND CORONARY ANGIOGRAPHY (N/A) as a surgical intervention .  The patient's history has been reviewed, patient examined, no change in status, stable for surgery.  I have reviewed the patient's chart and labs.  Questions were answered to the patient's satisfaction.     [place] [employer]

## 2018-07-24 NOTE — Discharge Instructions (Signed)
Femoral Site Care °This sheet gives you information about how to care for yourself after your procedure. Your health care provider may also give you more specific instructions. If you have problems or questions, contact your health care provider. °What can I expect after the procedure? °After the procedure, it is common to have: °· Bruising that usually fades within 1-2 weeks. °· Tenderness at the site. °Follow these instructions at home: °Wound care °· Follow instructions from your health care provider about how to take care of your insertion site. Make sure you: °? Wash your hands with soap and water before you change your bandage (dressing). If soap and water are not available, use hand sanitizer. °? Change your dressing as told by your health care provider. °? Leave stitches (sutures), skin glue, or adhesive strips in place. These skin closures may need to stay in place for 2 weeks or longer. If adhesive strip edges start to loosen and curl up, you may trim the loose edges. Do not remove adhesive strips completely unless your health care provider tells you to do that. °· Do not take baths, swim, or use a hot tub until your health care provider approves. °· You may shower 24-48 hours after the procedure or as told by your health care provider. °? Gently wash the site with plain soap and water. °? Pat the area dry with a clean towel. °? Do not rub the site. This may cause bleeding. °· Do not apply powder or lotion to the site. Keep the site clean and dry. °· Check your femoral site every day for signs of infection. Check for: °? Redness, swelling, or pain. °? Fluid or blood. °? Warmth. °? Pus or a bad smell. °Activity °· For the first 2-3 days after your procedure, or as long as directed: °? Avoid climbing stairs as much as possible. °? Do not squat. °· Do not lift anything that is heavier than 10 lb (4.5 kg), or the limit that you are told, until your health care provider says that it is safe. °· Rest as  directed. °? Avoid sitting for a long time without moving. Get up to take short walks every 1-2 hours. °· Do not drive for 24 hours if you were given a medicine to help you relax (sedative). °General instructions °· Take over-the-counter and prescription medicines only as told by your health care provider. °· Keep all follow-up visits as told by your health care provider. This is important. °Contact a health care provider if you have: °· A fever or chills. °· You have redness, swelling, or pain around your insertion site. °Get help right away if: °· The catheter insertion area swells very fast. °· You pass out. °· You suddenly start to sweat or your skin gets clammy. °· The catheter insertion area is bleeding, and the bleeding does not stop when you hold steady pressure on the area. °· The area near or just beyond the catheter insertion site becomes pale, cool, tingly, or numb. °These symptoms may represent a serious problem that is an emergency. Do not wait to see if the symptoms will go away. Get medical help right away. Call your local emergency services (911 in the [place] Do not drive yourself to the hospital. °Summary °· After the procedure, it is common to have bruising that usually fades within 1-2 weeks. °· Check your femoral site every day for signs of infection. °· Do not lift anything that is heavier than 10 lb (4.5 kg), or the   limit that you are told, until your health care provider says that it is safe. °This information is not intended to replace advice given to you by your health care provider. Make sure you discuss any questions you have with your health care provider. °Document Released: 01/08/2014 Document Revised: 05/20/2017 Document Reviewed: 05/20/2017 °Elsevier Interactive Patient Education © 2019 [employer] ° °

## 2018-07-24 NOTE — Progress Notes (Signed)
Site area: RFA/RFV Site Prior to Removal:  Level 0 Pressure Applied For:20 min Manual:  yes  Patient Status During Pull:  stable Post Pull Site:  Level 0 Post Pull Instructions Given:  yes Post Pull Pulses Present: palpable Dressing Applied:  clear Bedrest begins @ 0930 till 1330 Comments:

## 2018-07-24 NOTE — Progress Notes (Signed)
Patient with palpable hematoma in right groin.  RN held pressure for 20 minutes.  Site soft with small palpable hematoma, stable.

## 2018-07-25 DIAGNOSIS — D631 Anemia in chronic kidney disease: Secondary | ICD-10-CM | POA: Diagnosis not present

## 2018-07-25 DIAGNOSIS — M3210 Systemic lupus erythematosus, organ or system involvement unspecified: Secondary | ICD-10-CM | POA: Diagnosis not present

## 2018-07-25 DIAGNOSIS — N2581 Secondary hyperparathyroidism of renal origin: Secondary | ICD-10-CM | POA: Diagnosis not present

## 2018-07-25 DIAGNOSIS — D509 Iron deficiency anemia, unspecified: Secondary | ICD-10-CM | POA: Diagnosis not present

## 2018-07-25 DIAGNOSIS — N186 End stage renal disease: Secondary | ICD-10-CM | POA: Diagnosis not present

## 2018-07-28 DIAGNOSIS — N2581 Secondary hyperparathyroidism of renal origin: Secondary | ICD-10-CM | POA: Diagnosis not present

## 2018-07-28 DIAGNOSIS — N186 End stage renal disease: Secondary | ICD-10-CM | POA: Diagnosis not present

## 2018-07-28 DIAGNOSIS — D509 Iron deficiency anemia, unspecified: Secondary | ICD-10-CM | POA: Diagnosis not present

## 2018-07-28 DIAGNOSIS — M3210 Systemic lupus erythematosus, organ or system involvement unspecified: Secondary | ICD-10-CM | POA: Diagnosis not present

## 2018-07-28 DIAGNOSIS — D631 Anemia in chronic kidney disease: Secondary | ICD-10-CM | POA: Diagnosis not present

## 2018-07-28 NOTE — Progress Notes (Signed)
Left msg on VM about recent cath   MV is severely narrowed   AV is moderately narrowed Will review with surgeon and get back in touch with pt

## 2018-07-30 DIAGNOSIS — N186 End stage renal disease: Secondary | ICD-10-CM | POA: Diagnosis not present

## 2018-07-30 DIAGNOSIS — M3210 Systemic lupus erythematosus, organ or system involvement unspecified: Secondary | ICD-10-CM | POA: Diagnosis not present

## 2018-07-30 DIAGNOSIS — D631 Anemia in chronic kidney disease: Secondary | ICD-10-CM | POA: Diagnosis not present

## 2018-07-30 DIAGNOSIS — D509 Iron deficiency anemia, unspecified: Secondary | ICD-10-CM | POA: Diagnosis not present

## 2018-07-30 DIAGNOSIS — N2581 Secondary hyperparathyroidism of renal origin: Secondary | ICD-10-CM | POA: Diagnosis not present

## 2018-08-01 DIAGNOSIS — M3210 Systemic lupus erythematosus, organ or system involvement unspecified: Secondary | ICD-10-CM | POA: Diagnosis not present

## 2018-08-01 DIAGNOSIS — D631 Anemia in chronic kidney disease: Secondary | ICD-10-CM | POA: Diagnosis not present

## 2018-08-01 DIAGNOSIS — N186 End stage renal disease: Secondary | ICD-10-CM | POA: Diagnosis not present

## 2018-08-01 DIAGNOSIS — D509 Iron deficiency anemia, unspecified: Secondary | ICD-10-CM | POA: Diagnosis not present

## 2018-08-01 DIAGNOSIS — N2581 Secondary hyperparathyroidism of renal origin: Secondary | ICD-10-CM | POA: Diagnosis not present

## 2018-08-04 ENCOUNTER — Other Ambulatory Visit: Payer: Self-pay | Admitting: *Deleted

## 2018-08-04 DIAGNOSIS — I050 Rheumatic mitral stenosis: Secondary | ICD-10-CM

## 2018-08-04 DIAGNOSIS — N2581 Secondary hyperparathyroidism of renal origin: Secondary | ICD-10-CM | POA: Diagnosis not present

## 2018-08-04 DIAGNOSIS — D631 Anemia in chronic kidney disease: Secondary | ICD-10-CM | POA: Diagnosis not present

## 2018-08-04 DIAGNOSIS — N186 End stage renal disease: Secondary | ICD-10-CM | POA: Diagnosis not present

## 2018-08-04 DIAGNOSIS — D509 Iron deficiency anemia, unspecified: Secondary | ICD-10-CM | POA: Diagnosis not present

## 2018-08-04 DIAGNOSIS — I350 Nonrheumatic aortic (valve) stenosis: Secondary | ICD-10-CM

## 2018-08-04 DIAGNOSIS — M3210 Systemic lupus erythematosus, organ or system involvement unspecified: Secondary | ICD-10-CM | POA: Diagnosis not present

## 2018-08-06 DIAGNOSIS — D631 Anemia in chronic kidney disease: Secondary | ICD-10-CM | POA: Diagnosis not present

## 2018-08-06 DIAGNOSIS — D509 Iron deficiency anemia, unspecified: Secondary | ICD-10-CM | POA: Diagnosis not present

## 2018-08-06 DIAGNOSIS — N186 End stage renal disease: Secondary | ICD-10-CM | POA: Diagnosis not present

## 2018-08-06 DIAGNOSIS — N2581 Secondary hyperparathyroidism of renal origin: Secondary | ICD-10-CM | POA: Diagnosis not present

## 2018-08-06 DIAGNOSIS — M3210 Systemic lupus erythematosus, organ or system involvement unspecified: Secondary | ICD-10-CM | POA: Diagnosis not present

## 2018-08-08 DIAGNOSIS — D509 Iron deficiency anemia, unspecified: Secondary | ICD-10-CM | POA: Diagnosis not present

## 2018-08-08 DIAGNOSIS — M3210 Systemic lupus erythematosus, organ or system involvement unspecified: Secondary | ICD-10-CM | POA: Diagnosis not present

## 2018-08-08 DIAGNOSIS — N186 End stage renal disease: Secondary | ICD-10-CM | POA: Diagnosis not present

## 2018-08-08 DIAGNOSIS — D631 Anemia in chronic kidney disease: Secondary | ICD-10-CM | POA: Diagnosis not present

## 2018-08-08 DIAGNOSIS — N2581 Secondary hyperparathyroidism of renal origin: Secondary | ICD-10-CM | POA: Diagnosis not present

## 2018-08-11 DIAGNOSIS — D509 Iron deficiency anemia, unspecified: Secondary | ICD-10-CM | POA: Diagnosis not present

## 2018-08-11 DIAGNOSIS — N2581 Secondary hyperparathyroidism of renal origin: Secondary | ICD-10-CM | POA: Diagnosis not present

## 2018-08-11 DIAGNOSIS — M3210 Systemic lupus erythematosus, organ or system involvement unspecified: Secondary | ICD-10-CM | POA: Diagnosis not present

## 2018-08-11 DIAGNOSIS — D631 Anemia in chronic kidney disease: Secondary | ICD-10-CM | POA: Diagnosis not present

## 2018-08-11 DIAGNOSIS — N186 End stage renal disease: Secondary | ICD-10-CM | POA: Diagnosis not present

## 2018-08-13 DIAGNOSIS — N186 End stage renal disease: Secondary | ICD-10-CM | POA: Diagnosis not present

## 2018-08-13 DIAGNOSIS — M3210 Systemic lupus erythematosus, organ or system involvement unspecified: Secondary | ICD-10-CM | POA: Diagnosis not present

## 2018-08-13 DIAGNOSIS — D509 Iron deficiency anemia, unspecified: Secondary | ICD-10-CM | POA: Diagnosis not present

## 2018-08-13 DIAGNOSIS — N2581 Secondary hyperparathyroidism of renal origin: Secondary | ICD-10-CM | POA: Diagnosis not present

## 2018-08-13 DIAGNOSIS — D631 Anemia in chronic kidney disease: Secondary | ICD-10-CM | POA: Diagnosis not present

## 2018-08-15 DIAGNOSIS — M3210 Systemic lupus erythematosus, organ or system involvement unspecified: Secondary | ICD-10-CM | POA: Diagnosis not present

## 2018-08-15 DIAGNOSIS — N186 End stage renal disease: Secondary | ICD-10-CM | POA: Diagnosis not present

## 2018-08-15 DIAGNOSIS — D509 Iron deficiency anemia, unspecified: Secondary | ICD-10-CM | POA: Diagnosis not present

## 2018-08-15 DIAGNOSIS — D631 Anemia in chronic kidney disease: Secondary | ICD-10-CM | POA: Diagnosis not present

## 2018-08-15 DIAGNOSIS — N2581 Secondary hyperparathyroidism of renal origin: Secondary | ICD-10-CM | POA: Diagnosis not present

## 2018-08-18 DIAGNOSIS — M3210 Systemic lupus erythematosus, organ or system involvement unspecified: Secondary | ICD-10-CM | POA: Diagnosis not present

## 2018-08-18 DIAGNOSIS — D631 Anemia in chronic kidney disease: Secondary | ICD-10-CM | POA: Diagnosis not present

## 2018-08-18 DIAGNOSIS — N186 End stage renal disease: Secondary | ICD-10-CM | POA: Diagnosis not present

## 2018-08-18 DIAGNOSIS — D509 Iron deficiency anemia, unspecified: Secondary | ICD-10-CM | POA: Diagnosis not present

## 2018-08-18 DIAGNOSIS — N2581 Secondary hyperparathyroidism of renal origin: Secondary | ICD-10-CM | POA: Diagnosis not present

## 2018-08-20 DIAGNOSIS — N186 End stage renal disease: Secondary | ICD-10-CM | POA: Diagnosis not present

## 2018-08-20 DIAGNOSIS — D631 Anemia in chronic kidney disease: Secondary | ICD-10-CM | POA: Diagnosis not present

## 2018-08-20 DIAGNOSIS — D509 Iron deficiency anemia, unspecified: Secondary | ICD-10-CM | POA: Diagnosis not present

## 2018-08-20 DIAGNOSIS — Z992 Dependence on renal dialysis: Secondary | ICD-10-CM | POA: Diagnosis not present

## 2018-08-20 DIAGNOSIS — M3210 Systemic lupus erythematosus, organ or system involvement unspecified: Secondary | ICD-10-CM | POA: Diagnosis not present

## 2018-08-20 DIAGNOSIS — N2581 Secondary hyperparathyroidism of renal origin: Secondary | ICD-10-CM | POA: Diagnosis not present

## 2018-08-20 DIAGNOSIS — I120 Hypertensive chronic kidney disease with stage 5 chronic kidney disease or end stage renal disease: Secondary | ICD-10-CM | POA: Diagnosis not present

## 2018-08-22 DIAGNOSIS — D631 Anemia in chronic kidney disease: Secondary | ICD-10-CM | POA: Diagnosis not present

## 2018-08-22 DIAGNOSIS — N186 End stage renal disease: Secondary | ICD-10-CM | POA: Diagnosis not present

## 2018-08-22 DIAGNOSIS — N2581 Secondary hyperparathyroidism of renal origin: Secondary | ICD-10-CM | POA: Diagnosis not present

## 2018-08-22 DIAGNOSIS — M3210 Systemic lupus erythematosus, organ or system involvement unspecified: Secondary | ICD-10-CM | POA: Diagnosis not present

## 2018-08-22 DIAGNOSIS — D509 Iron deficiency anemia, unspecified: Secondary | ICD-10-CM | POA: Diagnosis not present

## 2018-08-25 ENCOUNTER — Encounter: Payer: Medicare Other | Admitting: Thoracic Surgery (Cardiothoracic Vascular Surgery)

## 2018-08-25 DIAGNOSIS — M3210 Systemic lupus erythematosus, organ or system involvement unspecified: Secondary | ICD-10-CM | POA: Diagnosis not present

## 2018-08-25 DIAGNOSIS — N186 End stage renal disease: Secondary | ICD-10-CM | POA: Diagnosis not present

## 2018-08-25 DIAGNOSIS — D631 Anemia in chronic kidney disease: Secondary | ICD-10-CM | POA: Diagnosis not present

## 2018-08-25 DIAGNOSIS — D509 Iron deficiency anemia, unspecified: Secondary | ICD-10-CM | POA: Diagnosis not present

## 2018-08-25 DIAGNOSIS — N2581 Secondary hyperparathyroidism of renal origin: Secondary | ICD-10-CM | POA: Diagnosis not present

## 2018-08-27 DIAGNOSIS — D509 Iron deficiency anemia, unspecified: Secondary | ICD-10-CM | POA: Diagnosis not present

## 2018-08-27 DIAGNOSIS — D631 Anemia in chronic kidney disease: Secondary | ICD-10-CM | POA: Diagnosis not present

## 2018-08-27 DIAGNOSIS — N186 End stage renal disease: Secondary | ICD-10-CM | POA: Diagnosis not present

## 2018-08-27 DIAGNOSIS — M3210 Systemic lupus erythematosus, organ or system involvement unspecified: Secondary | ICD-10-CM | POA: Diagnosis not present

## 2018-08-27 DIAGNOSIS — N2581 Secondary hyperparathyroidism of renal origin: Secondary | ICD-10-CM | POA: Diagnosis not present

## 2018-08-29 ENCOUNTER — Telehealth: Payer: Self-pay | Admitting: Internal Medicine

## 2018-08-29 DIAGNOSIS — N186 End stage renal disease: Secondary | ICD-10-CM | POA: Diagnosis not present

## 2018-08-29 DIAGNOSIS — N2581 Secondary hyperparathyroidism of renal origin: Secondary | ICD-10-CM | POA: Diagnosis not present

## 2018-08-29 DIAGNOSIS — M3210 Systemic lupus erythematosus, organ or system involvement unspecified: Secondary | ICD-10-CM | POA: Diagnosis not present

## 2018-08-29 DIAGNOSIS — D631 Anemia in chronic kidney disease: Secondary | ICD-10-CM | POA: Diagnosis not present

## 2018-08-29 DIAGNOSIS — D509 Iron deficiency anemia, unspecified: Secondary | ICD-10-CM | POA: Diagnosis not present

## 2018-08-29 MED ORDER — LABETALOL HCL 300 MG PO TABS: 300.0000 mg | ORAL_TABLET | Freq: Two times a day (BID) | ORAL | 3 refills | Status: DC

## 2018-08-29 NOTE — Telephone Encounter (Signed)
New Message    STAT if HR is under 50 or over 120 (normal HR is 60-100 beats per minute)  1) What is your heart rate? Right now HR49 couple times It went to HR 48  2) Do you have a log of your heart rate readings (document readings)? Yes  3) Do you have any other symptoms? Pt is at Dialysis right now and the nurse put her on oxygen to see if that will help bring HR up. Pt has been nauseated and has no apetite

## 2018-08-29 NOTE — Telephone Encounter (Signed)
Slower HR is not dangerous   BP was oK  Would however back down to 1 tablet 2x per day every day    Keep track of BP and HR

## 2018-08-29 NOTE — Telephone Encounter (Signed)
lpmtcb 4/10

## 2018-08-29 NOTE — Telephone Encounter (Signed)
Spoke with pt today regarding her HR. She states it has been sustained in the high 40's throughout the morning. Currently she is on a HR monitor in dialysis and being monitored by an Therapist, sports. Blood Pressure is in her normal range. She states this drop in her HR is unusual for her and has never happened during her dialysis treatment before. Her HR is generally in the 70's and 80's. She states she does not feel symptomatic with her low HR, but she is currently not ambulating while being dialyzed.   She has not taken any of her daily medications today. She generally takes her daily medications after dialysis.   I have advised her to hold her labetalol today. She will check her pulse throughout the day and tomorrow to monitor. She understands she should hold her labetalol for a HR < 60bpm.   I advised her I would forward her concerns to Dr Harrington Challenger for additional recommendation. We will contact her back with any additional instructions.  Pt would like to know if she needs to schedule a virtual visit with Dr. Harrington Challenger regarding this situation.

## 2018-08-29 NOTE — Telephone Encounter (Signed)
Please see accompanying phone not that I inadvertantly sent to Ronnie Derby.

## 2018-08-29 NOTE — Telephone Encounter (Signed)
I spoke to the patient and informed her of Dr Harrington Challenger' recommendations.  Labetalol 300 mg bid Daily.  She will call and update [place] with BP/HR next week.

## 2018-09-01 DIAGNOSIS — D509 Iron deficiency anemia, unspecified: Secondary | ICD-10-CM | POA: Diagnosis not present

## 2018-09-01 DIAGNOSIS — M3210 Systemic lupus erythematosus, organ or system involvement unspecified: Secondary | ICD-10-CM | POA: Diagnosis not present

## 2018-09-01 DIAGNOSIS — N2581 Secondary hyperparathyroidism of renal origin: Secondary | ICD-10-CM | POA: Diagnosis not present

## 2018-09-01 DIAGNOSIS — D631 Anemia in chronic kidney disease: Secondary | ICD-10-CM | POA: Diagnosis not present

## 2018-09-01 DIAGNOSIS — N186 End stage renal disease: Secondary | ICD-10-CM | POA: Diagnosis not present

## 2018-09-01 NOTE — Telephone Encounter (Signed)
HR improving since cutting back labetalol to 300 mg twice a day. Today at dialysis it was 78. BP higher now - highest today 193/87, 170s/ during treatment, end of treatment 140/.  Adv pt to monitor BP and HR at home tomorrow, and at dialysis Wed and give Dr. Harrington Challenger a call back with an update on BP/HR Wednesday after dialysis.   Adv she will likely need further medication adjustment to help BP management.

## 2018-09-01 NOTE — Telephone Encounter (Signed)
Pt called back. She wanted to report that she followed Michael's recommendations, but her HR is still low ( in the 40-50 range). She wants to know if there is anything else she needs to be doing to get her HR back to normal

## 2018-09-02 MED ORDER — HYDRALAZINE HCL 25 MG PO TABS: 25.0000 mg | ORAL_TABLET | Freq: Three times a day (TID) | ORAL | 3 refills | Status: DC

## 2018-09-02 NOTE — Addendum Note (Signed)
Addended by: Rodman Key on: 09/02/2018 05:48 PM   Modules accepted: Orders

## 2018-09-02 NOTE — Telephone Encounter (Signed)
Trial of hydralazine 25 mg 3x per day   Hold AM of dialysis Keep on other meds.

## 2018-09-02 NOTE — Telephone Encounter (Signed)
Informed patient. She will pick up and start hydralazine after dialysis tomorrow.  25 mg three times daily. Will continue all other medications. Adv to call back in about 1 week with blood pressures/heart rates. Adv to call sooner if further concerns.

## 2018-09-03 DIAGNOSIS — N186 End stage renal disease: Secondary | ICD-10-CM | POA: Diagnosis not present

## 2018-09-03 DIAGNOSIS — N2581 Secondary hyperparathyroidism of renal origin: Secondary | ICD-10-CM | POA: Diagnosis not present

## 2018-09-03 DIAGNOSIS — M3210 Systemic lupus erythematosus, organ or system involvement unspecified: Secondary | ICD-10-CM | POA: Diagnosis not present

## 2018-09-03 DIAGNOSIS — D509 Iron deficiency anemia, unspecified: Secondary | ICD-10-CM | POA: Diagnosis not present

## 2018-09-03 DIAGNOSIS — D631 Anemia in chronic kidney disease: Secondary | ICD-10-CM | POA: Diagnosis not present

## 2018-09-04 ENCOUNTER — Other Ambulatory Visit: Payer: Self-pay | Admitting: Internal Medicine

## 2018-09-04 DIAGNOSIS — I5032 Chronic diastolic (congestive) heart failure: Principal | ICD-10-CM

## 2018-09-04 DIAGNOSIS — I110 Hypertensive heart disease with heart failure: Secondary | ICD-10-CM

## 2018-09-05 DIAGNOSIS — N2581 Secondary hyperparathyroidism of renal origin: Secondary | ICD-10-CM | POA: Diagnosis not present

## 2018-09-05 DIAGNOSIS — M3210 Systemic lupus erythematosus, organ or system involvement unspecified: Secondary | ICD-10-CM | POA: Diagnosis not present

## 2018-09-05 DIAGNOSIS — D509 Iron deficiency anemia, unspecified: Secondary | ICD-10-CM | POA: Diagnosis not present

## 2018-09-05 DIAGNOSIS — N186 End stage renal disease: Secondary | ICD-10-CM | POA: Diagnosis not present

## 2018-09-05 DIAGNOSIS — D631 Anemia in chronic kidney disease: Secondary | ICD-10-CM | POA: Diagnosis not present

## 2018-09-08 DIAGNOSIS — M3210 Systemic lupus erythematosus, organ or system involvement unspecified: Secondary | ICD-10-CM | POA: Diagnosis not present

## 2018-09-08 DIAGNOSIS — D509 Iron deficiency anemia, unspecified: Secondary | ICD-10-CM | POA: Diagnosis not present

## 2018-09-08 DIAGNOSIS — D631 Anemia in chronic kidney disease: Secondary | ICD-10-CM | POA: Diagnosis not present

## 2018-09-08 DIAGNOSIS — N186 End stage renal disease: Secondary | ICD-10-CM | POA: Diagnosis not present

## 2018-09-08 DIAGNOSIS — N2581 Secondary hyperparathyroidism of renal origin: Secondary | ICD-10-CM | POA: Diagnosis not present

## 2018-09-10 DIAGNOSIS — D631 Anemia in chronic kidney disease: Secondary | ICD-10-CM | POA: Diagnosis not present

## 2018-09-10 DIAGNOSIS — N186 End stage renal disease: Secondary | ICD-10-CM | POA: Diagnosis not present

## 2018-09-10 DIAGNOSIS — N2581 Secondary hyperparathyroidism of renal origin: Secondary | ICD-10-CM | POA: Diagnosis not present

## 2018-09-10 DIAGNOSIS — D509 Iron deficiency anemia, unspecified: Secondary | ICD-10-CM | POA: Diagnosis not present

## 2018-09-10 DIAGNOSIS — M3210 Systemic lupus erythematosus, organ or system involvement unspecified: Secondary | ICD-10-CM | POA: Diagnosis not present

## 2018-09-12 DIAGNOSIS — N186 End stage renal disease: Secondary | ICD-10-CM | POA: Diagnosis not present

## 2018-09-12 DIAGNOSIS — M3210 Systemic lupus erythematosus, organ or system involvement unspecified: Secondary | ICD-10-CM | POA: Diagnosis not present

## 2018-09-12 DIAGNOSIS — N2581 Secondary hyperparathyroidism of renal origin: Secondary | ICD-10-CM | POA: Diagnosis not present

## 2018-09-12 DIAGNOSIS — D631 Anemia in chronic kidney disease: Secondary | ICD-10-CM | POA: Diagnosis not present

## 2018-09-12 DIAGNOSIS — D509 Iron deficiency anemia, unspecified: Secondary | ICD-10-CM | POA: Diagnosis not present

## 2018-09-15 DIAGNOSIS — M3210 Systemic lupus erythematosus, organ or system involvement unspecified: Secondary | ICD-10-CM | POA: Diagnosis not present

## 2018-09-15 DIAGNOSIS — D509 Iron deficiency anemia, unspecified: Secondary | ICD-10-CM | POA: Diagnosis not present

## 2018-09-15 DIAGNOSIS — N186 End stage renal disease: Secondary | ICD-10-CM | POA: Diagnosis not present

## 2018-09-15 DIAGNOSIS — N2581 Secondary hyperparathyroidism of renal origin: Secondary | ICD-10-CM | POA: Diagnosis not present

## 2018-09-15 DIAGNOSIS — D631 Anemia in chronic kidney disease: Secondary | ICD-10-CM | POA: Diagnosis not present

## 2018-09-17 DIAGNOSIS — D631 Anemia in chronic kidney disease: Secondary | ICD-10-CM | POA: Diagnosis not present

## 2018-09-17 DIAGNOSIS — N2581 Secondary hyperparathyroidism of renal origin: Secondary | ICD-10-CM | POA: Diagnosis not present

## 2018-09-17 DIAGNOSIS — N186 End stage renal disease: Secondary | ICD-10-CM | POA: Diagnosis not present

## 2018-09-17 DIAGNOSIS — D509 Iron deficiency anemia, unspecified: Secondary | ICD-10-CM | POA: Diagnosis not present

## 2018-09-17 DIAGNOSIS — M3210 Systemic lupus erythematosus, organ or system involvement unspecified: Secondary | ICD-10-CM | POA: Diagnosis not present

## 2018-09-19 DIAGNOSIS — D509 Iron deficiency anemia, unspecified: Secondary | ICD-10-CM | POA: Diagnosis not present

## 2018-09-19 DIAGNOSIS — N2581 Secondary hyperparathyroidism of renal origin: Secondary | ICD-10-CM | POA: Diagnosis not present

## 2018-09-19 DIAGNOSIS — M3210 Systemic lupus erythematosus, organ or system involvement unspecified: Secondary | ICD-10-CM | POA: Diagnosis not present

## 2018-09-19 DIAGNOSIS — I120 Hypertensive chronic kidney disease with stage 5 chronic kidney disease or end stage renal disease: Secondary | ICD-10-CM | POA: Diagnosis not present

## 2018-09-19 DIAGNOSIS — D631 Anemia in chronic kidney disease: Secondary | ICD-10-CM | POA: Diagnosis not present

## 2018-09-19 DIAGNOSIS — N186 End stage renal disease: Secondary | ICD-10-CM | POA: Diagnosis not present

## 2018-09-19 DIAGNOSIS — Z992 Dependence on renal dialysis: Secondary | ICD-10-CM | POA: Diagnosis not present

## 2018-09-22 ENCOUNTER — Other Ambulatory Visit: Payer: Self-pay

## 2018-09-22 ENCOUNTER — Encounter: Payer: Medicare Other | Admitting: Thoracic Surgery (Cardiothoracic Vascular Surgery)

## 2018-09-22 DIAGNOSIS — D631 Anemia in chronic kidney disease: Secondary | ICD-10-CM | POA: Diagnosis not present

## 2018-09-22 DIAGNOSIS — M3210 Systemic lupus erythematosus, organ or system involvement unspecified: Secondary | ICD-10-CM | POA: Diagnosis not present

## 2018-09-22 DIAGNOSIS — D509 Iron deficiency anemia, unspecified: Secondary | ICD-10-CM | POA: Diagnosis not present

## 2018-09-22 DIAGNOSIS — N2581 Secondary hyperparathyroidism of renal origin: Secondary | ICD-10-CM | POA: Diagnosis not present

## 2018-09-22 DIAGNOSIS — N186 End stage renal disease: Secondary | ICD-10-CM | POA: Diagnosis not present

## 2018-09-23 ENCOUNTER — Other Ambulatory Visit: Payer: Self-pay | Admitting: *Deleted

## 2018-09-23 ENCOUNTER — Institutional Professional Consult (permissible substitution) (INDEPENDENT_AMBULATORY_CARE_PROVIDER_SITE_OTHER): Payer: Medicare Other | Admitting: Thoracic Surgery (Cardiothoracic Vascular Surgery)

## 2018-09-23 ENCOUNTER — Encounter: Payer: Self-pay | Admitting: Thoracic Surgery (Cardiothoracic Vascular Surgery)

## 2018-09-23 VITALS — BP 120/68 | HR 80 | Temp 96.5°F | Resp 18 | Ht 66.0 in | Wt 135.0 lb

## 2018-09-23 DIAGNOSIS — N186 End stage renal disease: Secondary | ICD-10-CM | POA: Diagnosis not present

## 2018-09-23 DIAGNOSIS — I5032 Chronic diastolic (congestive) heart failure: Secondary | ICD-10-CM

## 2018-09-23 DIAGNOSIS — I050 Rheumatic mitral stenosis: Secondary | ICD-10-CM | POA: Diagnosis not present

## 2018-09-23 DIAGNOSIS — I350 Nonrheumatic aortic (valve) stenosis: Secondary | ICD-10-CM

## 2018-09-23 DIAGNOSIS — Z992 Dependence on renal dialysis: Secondary | ICD-10-CM | POA: Diagnosis not present

## 2018-09-23 NOTE — Progress Notes (Addendum)
[address]       [place] [address]             [phone]     CARDIOTHORACIC SURGERY CONSULTATION REPORT  Referring Provider is Fay Records, MD  PCP is Benito Mccreedy, MD  Chief Complaint  Patient presents with   Aortic Stenosis    Surgical eval, ECHO 07/03/18, Cardiac Cath 07/24/18   Mitral Stenosis    HPI:  Patient is a 58 year old African-American female with history of aortic stenosis, mitral stenosis, end-stage renal disease on hemodialysis, hypertension, lupus, remote history of stroke, dysfunctional uterine bleeding, peptic ulcer disease, and chronic anemia who has been referred for surgical consultation to discuss treatment options for management of aortic stenosis and mitral stenosis.  Patient's cardiac history dates back to 2016 when she was hospitalized with acute hypoxic respiratory failure and flash pulmonary edema in the setting of influenza A pneumonia and hypertensive urgency.  She was treated with dialysis and Tamiflu and recovered.  Cardiac enzymes were elevated at that time.  Echocardiogram revealed normal left ventricular function with moderate mitral stenosis and mild aortic stenosis.  She has been followed intermittently ever since by Dr. Harrington Challenger.  She has had numerous hospitalizations over the last several years for a variety of problems.  June 2019 she was hospitalized with respiratory distress and volume overload.  Apparently she was in rapid atrial fibrillation for a brief period of time.  Transesophageal echocardiogram performed January 02, 2018 revealed normal left ventricular systolic function with moderate aortic stenosis.  Findings were suggestive of possible bicuspid aortic valve.  There was felt to be "at least moderate mitral stenosis".  Mean transvalvular gradient across the mitral valve was estimated 16 mmHg with mitral valve area by pressure half-time calculated 2.8 cm.  By planimetry the valve area measured 1.5 cm.  There  was mild to moderate mitral regurgitation and heavy calcification of the mitral valve with severely restricted leaflet mobility.  She was treated medically and seen most recently in follow-up by Dr. Harrington Challenger on July 03, 2018.  Follow-up echocardiogram performed at that time revealed increased transvalvular gradients across both the mitral and aortic valves and findings suggestive of high filling pressures.  Peak transvalvular gradient across the aortic valve measured 4.1 m/s corresponding to mean transvalvular gradient estimated 33 mmHg and aortic valve area calculated 1.1 cm.  The DVI was relatively high and reported 0.37.  Mean transvalvular gradient across the mitral valve was reported 21.7 mmHg but the mitral valve area calculated based on pressure half-time was reported 3.91 cm.  Left ventricular systolic function remain normal with ejection fraction estimated 60 to 65%.  The patient subsequently underwent left and right heart catheterization on July 24, 2018.  She is found to have normal coronary artery anatomy with no significant coronary artery disease.  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  There was mildly elevated pulmonary capillary wedge pressure (19 mmHg) and high normal cardiac output (6.15 L/min).  There was severe mitral stenosis with mean transvalvular gradient estimated 22 mmHg.  There was moderate aortic stenosis with mean transvalvular gradients measured 10 mmHg using simultaneous waveforms from the femoral artery to the left ventricle and 24 mmHg on pullback gradient.  Cardiothoracic surgical consultation was requested.  Patient is single and lives with 1 of her adult daughters locally in [place].  She has been disabled for approximately 12 years because of her  underlying chronic kidney disease.  She remains functionally independent.  She drives an automobile and takes care of ordinary day-to-day activities  around the house.  She dialyzes on a Monday Wednesday Friday schedule at the [facility] dialysis center via right upper arm AV fistula.  She has not had problems with dialysis treatments recently.  She describes stable symptoms of exertional shortness of breath and decreased energy.  She states that her primary problem is that of low energy.  She does get short of breath if she goes up a flight of stairs.  She denies resting shortness of breath, PND, orthopnea, or lower extremity edema.  She has never had any chest pain or chest tightness.  She reports occasional palpitation without any history of significant dizzy spells or syncope.  Past Medical History:  Diagnosis Date   Anemia    Aortic stenosis 09/25/2016   Echo 07/24/16: Mod conc LVH, EF 60-65, no RWMA, Gr 2 DDd, bicuspid aortic valve, mild to mod AS (mean 18, peak 38), MAC, mod mitral stenosis (mean 9, peak 19), mild to mod MR, severe LAE, normal RVSF, mild RAE, mild TR   Arthritis    "joints" (10/23/2017)   Blood transfusion '08   [facility]; "low HgB" (10/23/2017)   Chronic diastolic CHF (congestive heart failure) (HCC)    Dysfunctional uterine bleeding 12/19/2010   ESRD (end stage renal disease) on dialysis ([place])    "MWF; [name]." (10/23/2017)   GERD (gastroesophageal reflux disease)    Headache    Heart murmur    Hemodialysis patient ([place])    right extremity port   History of hiatal hernia    Hx of cardiovascular stress test    Lexiscan Myoview 4/16:  Normal stress nuclear study, EF 59%   Hypertension    Lupus ([place])    "? kind" (10/23/2017)   Mitral stenosis    Echo 4/16:  EF 55-60%, no RWMA, Gr 1 DD, mod MS (mean 9 mmHg), mod LAE, mild RAE, PASP 65, mod to severe TR, trivial eff // Echo 6/19:  Mild LVH, EF 55-60, no RWMA, Gr 2 DD, mild to mod AS (Mean 20), severe MS (mean 17), massive LAE, PASP 48, trivial effusion    Peptic ulcer disease    Pneumonia 10/21/2017   PONV (postoperative nausea and vomiting)    Stroke  ([place])    per patient "they said i had a small stroke but i couldnt even tell"    Past Surgical History:  Procedure Laterality Date   AV FISTULA PLACEMENT Right 02/25/2018   Procedure: INSERTION OF 66m x 16cm ARTEGRAFT;  Surgeon: CWaynetta Sandy MD;  [facility]  Service: Vascular;  Laterality: Right;   COLONOSCOPY W/ BIOPSIES AND POLYPECTOMY     DIALYSIS FISTULA CREATION  2007   ENDOMETRIAL ABLATION     ESOPHAGOGASTRODUODENOSCOPY N/A 07/31/2014   Procedure: ESOPHAGOGASTRODUODENOSCOPY (EGD);  Surgeon: MClarene Essex MD;  [facility]  Service: Endoscopy;  Laterality: N/A;   FISTULOGRAM Right 02/25/2018   Procedure: FISTULOGRAM ARM;  Surgeon: CWaynetta Sandy MD;  [place]  Service: Vascular;  Laterality: Right;   HEMATOMA EVACUATION Right 03/06/2018   Procedure: EVACUATION HEMATOMA RIGHT UPPER ARM;  Surgeon: CWaynetta Sandy MD;  [place] [facility]  Service: Vascular;  Laterality: Right;   INSERTION OF ARTERIOVENOUS (AV) ARTEGRAFT ARM Right 09/26/2017   Procedure: INSERTION OF ARTERIOVENOUS (AV) ARTEGRAFT INTO RIGHT ARM;  Surgeon: CWaynetta Sandy MD;  [facility]  Service: Vascular;  Laterality:  Right;   INSERTION OF DIALYSIS CATHETER Right 03/06/2018   Procedure: INSERTION OF DIALYSIS CATHETER, right internal jugular;  Surgeon: Waynetta Sandy, MD;  Location: [place];  Service: Vascular;  Laterality: Right;   REVISON OF ARTERIOVENOUS FISTULA Right 09/26/2017   Procedure: REVISION OF ARTERIOVENOUS FISTULA RIGHT ARM WITH ARTEGRAFT;  Surgeon: Waynetta Sandy, MD;  Location: [place];  Service: Vascular;  Laterality: Right;   REVISON OF ARTERIOVENOUS FISTULA Right 02/25/2018   Procedure: REVISION OF ARTERIOVENOUS FISTULA;  Surgeon: Waynetta Sandy, MD;  Location: [place];  Service: Vascular;  Laterality: Right;   RIGHT/LEFT HEART CATH AND CORONARY ANGIOGRAPHY N/A 07/24/2018   Procedure: RIGHT/LEFT HEART  CATH AND CORONARY ANGIOGRAPHY;  Surgeon: Larey Dresser, MD;  Location: [place] [facility];  Service: Cardiovascular;  Laterality: N/A;   SHOULDER OPEN ROTATOR CUFF REPAIR Right 10/09/2016   Procedure: ROTATOR CUFF REPAIR SHOULDER OPEN partial acrominectomy and extensive synovectomy;  Surgeon: Latanya [name], MD;  [facility];  Service: Orthopedics;  Laterality: Right;  RNFA   TEE WITHOUT CARDIOVERSION N/A 01/02/2018   Procedure: TRANSESOPHAGEAL ECHOCARDIOGRAM (TEE) Bubble Study;  Surgeon: Larey Dresser, MD;  Location: [facility] ENDOSCOPY;  Service: Cardiovascular;  Laterality: N/A;    Family History  Problem Relation Age of Onset   Liver cancer Maternal Grandmother    Lymphoma Maternal Aunt    Hypertension Mother    Renal Disease Father    Hypertension Father    Heart attack Neg Hx     Social History   Socioeconomic History   Marital status: Single    Spouse name: Not on file   Number of children: Not on file   Years of education: Not on file   Highest education level: Not on file  Occupational History   Not on file  Social Needs   Financial resource strain: Not on file   Food insecurity:    Worry: Not on file    Inability: Not on file   Transportation needs:    Medical: Not on file    Non-medical: Not on file  Tobacco Use   Smoking status: Current Some Day Smoker    Packs/day: 0.10    Years: 10.00    Pack years: 1.00    Types: Cigarettes    Start date: 10/21/2017   Smokeless tobacco: Never Used   Tobacco comment: 3 cigs a week  Substance and Sexual Activity   Alcohol use: Yes    Alcohol/week: 1.0 standard drinks    Types: 1 Cans of beer per week    Comment: occasional beer   Drug use: Yes    Types: Marijuana, Cocaine    Comment: 03/04/18- marijuana- this weekend   Sexual activity: Not Currently  Lifestyle   Physical activity:    Days per week: Not on file    Minutes per session: Not on file   Stress: Not on file  Relationships    Social connections:    Talks on phone: Not on file    Gets together: Not on file    Attends religious service: Not on file    Active member of club or organization: Not on file    Attends meetings of clubs or organizations: Not on file    Relationship status: Not on file   Intimate partner violence:    Fear of current or ex partner: Not on file    Emotionally abused: Not on file    Physically abused: Not on file    Forced sexual  activity: Not on file  Other Topics Concern   Not on file  Social History Narrative   Not on file    Current Outpatient Medications  Medication Sig Dispense Refill   acetaminophen (TYLENOL) 500 MG tablet Take 1,000 mg by mouth every 6 (six) hours as needed for headache (pain).      amLODipine (NORVASC) 10 MG tablet Take 1 tablet (10 mg total) by mouth at bedtime. 30 tablet 4   aspirin EC 81 MG tablet Take 81 mg by mouth at bedtime.     calcitRIOL (ROCALTROL) 0.25 MCG capsule Take 7 capsules (1.75 mcg total) by mouth every other day. 14 capsule 0   cloNIDine (CATAPRES) 0.2 MG tablet Take 1 tablet (0.2 mg) by mouth twice daily - on dialysis days take after dialysis and at bedtime, on other days take one in the morning and one at night 180 tablet 3   ferric citrate (AURYXIA) 1 GM 210 MG(Fe) tablet Take 420-840 mg by mouth See admin instructions. Take 420-840 mg with each meal, take 420 mg with each snack     guaiFENesin (MUCINEX) 600 MG 12 hr tablet Take 300 mg by mouth 2 (two) times daily as needed for cough or to loosen phlegm.     labetalol (NORMODYNE) 300 MG tablet Take 1 tablet (300 mg total) by mouth 2 (two) times daily. 90 tablet 3   lanthanum (FOSRENOL) 1000 MG chewable tablet Chew 2 tablets (2,000 mg total) by mouth 3 (three) times daily with meals. 180 tablet 0   multivitamin (RENA-VIT) TABS tablet Take 1 tablet by mouth at bedtime.      ondansetron (ZOFRAN) 4 MG tablet Take 4 mg by mouth every 6 (six) hours as needed for nausea or vomiting.      oxyCODONE-acetaminophen (PERCOCET) 7.5-325 MG tablet Take 1 tablet by mouth every 6 (six) hours as needed for severe pain. 15 tablet 0   pantoprazole (PROTONIX) 40 MG tablet Take 40 mg by mouth 2 (two) times daily.      hydrALAZINE (APRESOLINE) 25 MG tablet Take 1 tablet (25 mg total) by mouth 3 (three) times daily. (Patient not taking: Reported on 09/23/2018) 270 tablet 3   No current facility-administered medications for this visit.     No Known Allergies    Review of Systems:   General:  normal appetite, decreased energy, no weight gain, no weight loss, no fever  Cardiac:  no chest pain with exertion, no chest pain at rest, +SOB with moderate level exertion, no resting SOB, no PND, no orthopnea, no palpitations, no arrhythmia, no atrial fibrillation, no LE edema, no dizzy spells, no syncope  Respiratory:  Mild exertional shortness of breath, no home oxygen, no current productive cough, occasional dry cough, recent episode bronchitis 6-8 weeks ago, no wheezing, no hemoptysis, no asthma, no pain with inspiration or cough, no sleep apnea, no CPAP at night  GI:   no difficulty swallowing, no reflux, no frequent heartburn, no hiatal hernia, no abdominal pain, no constipation, no diarrhea, no hematochezia, no hematemesis, no melena  GU:   Makes a very small amount of urine, no dysuria,  no frequency, no urinary tract infection, no hematuria, no kidney stones, + chronic kidney disease  Vascular:  no pain suggestive of claudication, no pain in feet, no leg cramps, no varicose veins, no DVT, no non-healing foot ulcer  Neuro:   + remote h/o stroke, no TIA's, no seizures, no headaches, no temporary blindness one eye,  no slurred speech, no  peripheral neuropathy, no chronic pain, no instability of gait, no memory/cognitive dysfunction  Musculoskeletal: mild arthritis, no joint swelling, no myalgias, no difficulty walking, normal mobility   Skin:   no rash, no itching, no skin infections, no  pressure sores or ulcerations  Psych:   no anxiety, no depression, no nervousness, no unusual recent stress  Eyes:   no blurry vision, no floaters, no recent vision changes, does not wear glasses or contacts  ENT:   no hearing loss, no loose or painful teeth, no dentures, last saw dentist 6 months ago  Hematologic:  no easy bruising, no abnormal bleeding, no clotting disorder, no frequent epistaxis  Endocrine:  no diabetes, does not check CBG's at home     Physical Exam:   BP 120/68 (BP Location: Left Arm, Patient Position: Sitting, Cuff Size: Normal)    Pulse 80    Temp (!) 96.5 F (35.8 C) (Skin)    Resp 18    Ht _0  (1.676 m)    Wt 135 lb (61.2 kg)    LMP 12/05/2010 (LMP Unknown)    SpO2 99% Comment: RA   BMI 21.79 kg/m   General:  Thin AA female NAD  HEENT:  Unremarkable   Neck:   no JVD, no bruits, no adenopathy   Chest:   clear to auscultation, symmetrical breath sounds, no wheezes, no rhonchi   CV:   RRR, grade IV/VI harsh systolic murmur   Abdomen:  soft, non-tender, no masses   Extremities:  warm, well-perfused, pulses diminished, no LE edema  Rectal/GU  Deferred  Neuro:   Grossly non-focal and symmetrical throughout  Skin:   Clean and dry, no rashes, no breakdown   Diagnostic Tests:  Transesophageal Echocardiography  Patient:    Jaedin, Trumbo MR #:       [id number] Study Date: 01/02/2018 Gender:     F Age:        56 Height:     167.6 cm Weight:     56.7 kg BSA:        1.62 m^2 Pt. Status: Room:   Daleen Squibb, M.D.  REFERRING    Dorris Carnes, M.D.  ADMITTING    Loralie Champagne, M.D.  ATTENDING    Loralie Champagne, M.D.  PERFORMING   Loralie Champagne, M.D.  SONOGRAPHER  Roseanna Rainbow  cc:  ------------------------------------------------------------------- LV EF: 55% -   60%  ------------------------------------------------------------------- Indications:      424.0 Mitral valve disease. Severe  valvular disease  ------------------------------------------------------------------- History:   PMH:  ESRD. Lupus. Aortic stenosis.  Mitral stenosis. Stroke.  Risk factors:  Hypertension.  ------------------------------------------------------------------- Study Conclusions  - Left ventricle: The cavity size was normal. Wall thickness was   increased in a pattern of mild LVH. Systolic function was normal.   The estimated ejection fraction was in the range of 55% to 60%.   Wall motion was normal; there were no regional wall motion   abnormalities. - Aortic valve: Functionally bicuspid aortic valve with fused   noncoronary and left coronary cusps. Moderate aortic stenosis   with mean gradient 26 mmHg. No AI. - Aorta: Normal caliber thoracic aorta with grade III plaque   descending thoracic aorta. - Mitral valve: The mitral valve was heavily calcified and   restricted. There was mild to moderate mitral regurgitation.   Mitral valve mean gradient 16 mmHg, PHT 78 msec, MVA 2.8 cm^2 by   PHT. By planimetry, MVA 1.5 cm^2. Looking at the valve, there  appears to be at least moderate mitral stenosis. I think that the   PHT-derived value is inaccurate. - Left atrium: The atrium was mildly dilated. No evidence of   thrombus in the atrial cavity or appendage. - Right ventricle: The cavity size was normal. Systolic function   was mildly reduced. - Right atrium: No evidence of thrombus in the atrial cavity or   appendage. - Atrial septum: No defect or patent foramen ovale was identified.   Echo contrast study showed no right-to-left atrial level shunt,   at baseline or with provocation. - Tricuspid valve: Mild TR with peak RV-RA 41 mmHg.  Impressions:  - Moderate aortic stenosis with functionally bicuspid valve. There   was at least moderate mitral stenosis by mean gradient and   planimetry. I think that the pressure half-time derived value for   MVA was inaccurate. Mild to  moderate MR.  ------------------------------------------------------------------- Study data:   Study status:  Routine.  Consent:  The risks, benefits, and alternatives to the procedure were explained to the patient and informed consent was obtained.  Procedure:  The patient reported no pain pre or post test. Initial setup. The patient was brought to the laboratory. Surface ECG leads were monitored. Sedation. Conscious sedation was administered by cardiology staff. Transesophageal echocardiography. Topical anesthesia was obtained using viscous lidocaine. An adult multiplane transesophageal probe was inserted by the attending cardiologistwithout difficulty. 3D image quality was excellent. Intravenous contrast (agitated saline) was administered.  Study completion:  The patient tolerated the procedure well. There were no complications.  Administered medications:   Fentanyl, 105mg, IV.  Midazolam, 440m IV. Diagnostic transesophageal echocardiography.  2D and color Doppler.  Birthdate:  Patient birthdate: 10Mar 10, 1962 Age:  Patient is 89r old.  Sex:  Gender: female.    BMI: 20.2 kg/m^2.  Blood pressure:   180/110  Patient status:  Inpatient.  Study date:  Study date: 01/02/2018. Study time: 09:57 AM.  Location:  Endoscopy.  -------------------------------------------------------------------  ------------------------------------------------------------------- Left ventricle:  The cavity size was normal. Wall thickness was increased in a pattern of mild LVH. Systolic function was normal. The estimated ejection fraction was in the range of 55% to 60%. Wall motion was normal; there were no regional wall motion abnormalities.  ------------------------------------------------------------------- Aortic valve:  Functionally bicuspid aortic valve with fused noncoronary and left coronary cusps. Moderate aortic stenosis with mean gradient 26 mmHg. No  AI.  ------------------------------------------------------------------- Aorta:  Normal caliber thoracic aorta with grade III plaque descending thoracic aorta.  ------------------------------------------------------------------- Mitral valve:  The mitral valve was heavily calcified and restricted. There was mild to moderate mitral regurgitation. Mitral valve mean gradient 16 mmHg, PHT 78 msec, MVA 2.8 cm^2 by PHT. By planimetry, MVA 1.5 cm^2. Looking at the valve, there appears to be at least moderate mitral stenosis. I think that the PHT-derived value is inaccurate.  Doppler:     Valve area by pressure half-time: 2.78 cm^2. Indexed valve area by pressure half-time: 1.72 cm^2/m^2.    Mean gradient (D): 14 mm Hg.  ------------------------------------------------------------------- Left atrium:  The atrium was mildly dilated.  No evidence of thrombus in the atrial cavity or appendage.  ------------------------------------------------------------------- Atrial septum:  No defect or patent foramen ovale was identified. Echo contrast study showed no right-to-left atrial level shunt, at baseline or with provocation.  ------------------------------------------------------------------- Right ventricle:  The cavity size was normal. Systolic function was mildly reduced.  ------------------------------------------------------------------- Pulmonic valve:    Structurally normal valve.   Cusp separation was normal.  ------------------------------------------------------------------- Tricuspid valve:  Mild TR with peak RV-RA 41 mmHg.  ------------------------------------------------------------------- Right atrium:  The atrium was normal in size.  No evidence of thrombus in the atrial cavity or appendage.  ------------------------------------------------------------------- Pericardium:  There was no pericardial effusion.    ------------------------------------------------------------------- Post procedure conclusions Ascending Aorta:  - Normal caliber thoracic aorta with grade III plaque descending   thoracic aorta.  ------------------------------------------------------------------- Measurements   Aortic valve                        Value  Aortic valve peak velocity, S       297   cm/s  Aortic valve mean velocity, S       195   cm/s  Aortic valve VTI, S                 65.1  cm    Mitral valve                        Value  Mitral mean velocity, D             172   cm/s  Mitral pressure half-time           78    ms  Mitral mean gradient, D             14    mm Hg  Mitral valve area, PHT, DP          2.78  cm^2  Mitral valve area/bsa, PHT, DP      1.72  cm^2/m^2  Mitral annulus VTI, D               55.9  cm  Legend: (L)  and  (H)  mark values outside specified reference range.  ------------------------------------------------------------------- Prepared and Electronically Authenticated by  Loralie Champagne, M.D. 2019-09-30T14:12:12    ECHOCARDIOGRAM REPORT    Patient Name:   [name]   Date of Exam: 07/03/2018 Medical Rec #:  [id number]     Height:       66.0 in Accession #:    [id number]    Weight:       139.0 lb Date of Birth:  May 06, 1961    BSA:          1.71 m Patient Age:    20 years      BP:           140/87 mmHg Patient Gender: F             HR:           76 bpm. Exam Location:  [address]    Procedure: 2D Echo, Cardiac Doppler and Color Doppler  Indications:    I35.9 Aortic valve disorder.   History:        Patient has prior history of Echocardiogram examinations, most                 recent 01/02/2018. Stroke, Atrial Fibrillation; Signs/Symptoms:                 Dyspnea; Risk Factors: Hypertension. Aortic stenosis. Mitral                 stenosis. Anemia. End stage renal disease. Lupus.   Sonographer:    Diamond Nickel RCS Referring Phys: [id number] PAULA ROSS  V  IMPRESSIONS    1. The left ventricle has normal systolic function with an ejection fraction of 60-65%. The cavity size was  normal. There is mildly increased left ventricular wall thickness. Left ventricular diastolic Doppler parameters are consistent with  pseudonormalization No evidence of left ventricular regional wall motion abnormalities.  2. The right ventricle has normal systolic function. The cavity was normal. There is no increase in right ventricular wall thickness.  3. There is severe calcification. There is moderate to severe mitral annular calcification present. Mitral valve regurgitation is mild to moderate by color flow Doppler. Mean gradient across the mitral valve = 21 mmHg. Valve area by PHT is not  significantly decreased, but visually and by mean gradient, the mitral stenosis appears severe.  4. The aortic valve is tricuspid There is severe calcifcation of the aortic valve. There is moderate stenosis of the aortic valve. Mean gradient 34 mmHg with AVA 1.23 cm^2.  5. The pulmonic valve was normal in structure.  6. The aortic root and ascending aorta are normal in size and structure.  7. Left atrial size was severely dilated.  8. Right atrial pressure is estimated at 3 mmHg.  9. PA systolic pressure 34 mmHg. 10. Trivial pericardial effusion. 11. The tricuspid valve is normal in structure.  FINDINGS  Left Ventricle: The left ventricle has normal systolic function, with an ejection fraction of 60-65%. The cavity size was normal. There is mildly increased left ventricular wall thickness. Left ventricular diastolic Doppler parameters are consistent  with pseudonormalization No evidence of left ventricular regional wall motion abnormalities.. Right Ventricle: The right ventricle has normal systolic function. The cavity was normal. There is no increase in right ventricular wall thickness. Left Atrium: left atrial size was severely dilated Right Atrium: right atrial size was  normal in size Right atrial pressure is estimated at 3 mmHg. Interatrial Septum: No atrial level shunt detected by color flow Doppler. Pericardium: Trivial pericardial effusion is present. Mitral Valve: The mitral valve is degenerative in appearance. There is severe calcification. There is moderate to severe mitral annular calcification present. Mitral valve regurgitation is mild to moderate by color flow Doppler. Tricuspid Valve: The tricuspid valve is normal in structure. Tricuspid valve regurgitation is trivial by color flow Doppler. Aortic Valve: The aortic valve is tricuspid Aortic valve regurgitation was not visualized by color flow Doppler. There is moderate stenosis of the aortic valve, with a calculated valve area of 1.09 cm. Pulmonic Valve: The pulmonic valve was normal in structure. Pulmonic valve regurgitation is not visualized by color flow Doppler. Aorta: The aortic root and ascending aorta are normal in size and structure. Venous: The inferior vena cava is normal in size with greater than 50% respiratory variability.   LEFT VENTRICLE PLAX 2D (Teich) LV EF:          82.9 %   Diastology LVIDd:          4.10 cm  LV e' lateral:   4.64 cm/s LVIDs:          2.00 cm  LV E/e' lateral: 52.6 LV PW:          1.50 cm  LV e' medial:    6.09 cm/s LV IVS:         1.30 cm  LV E/e' medial:  40.1 LVOT diam:      1.95 cm LV SV:          61 ml LVOT Area:      2.99 cm  RIGHT VENTRICLE RV Basal diam:  2.34 cm RV S prime:     10.70 cm/s TAPSE (M-mode): 2.1 cm RVSP:  34.4 mmHg  LEFT ATRIUM              Index       RIGHT ATRIUM           Index LA diam:        5.70 cm  3.33 cm/m  RA Pressure: 3 mmHg LA Vol (A2C):   131.0 ml 76.46 ml/m RA Area:     14.60 cm LA Vol (A4C):   153.0 ml 89.30 ml/m RA Volume:   32.10 ml  18.74 ml/m LA Biplane Vol: 142.0 ml 82.88 ml/m  AORTIC VALVE AV Area (Vmax):    1.11 cm AV Area (Vmean):   1.16 cm AV Area (VTI):     1.09 cm AV Vmax:            406.67 cm/s AV Vmean:          264.000 cm/s AV VTI:            0.931 m AV Peak Grad:      66.2 mmHg AV Mean Grad:      33.0 mmHg LVOT Vmax:         151.00 cm/s LVOT Vmean:        102.967 cm/s LVOT VTI:          0.341 m LVOT/AV VTI ratio: 0.37   AORTA Ao Root diam: 2.80 cm  MITRAL VALVE               TRICUSPID VALVE MV Area (PHT): 3.91 cm    TR Peak grad:   31.4 mmHg MV Peak grad:  48.5 mmHg   TR Vmax:        280.00 cm/s MV Mean grad:  21.7 mmHg   RVSP:           34.4 mmHg MV Vmax:       3.48 m/s MV Vmean:      214.3 cm/s MV VTI:        0.99 m MV PHT:        56.26 msec MV Decel Time: 194 msec MR Peak grad: 146.4 mmHg MR Mean grad: 98.3 mmHg MR Vmax:      605.00 cm/s MR Vmean:     470.3 cm/s MV E velocity: 244.00 cm/s MV A velocity: 208.00 cm/s MV E/A ratio:  1.17    Loralie Champagne MD Electronically signed by Loralie Champagne MD Signature Date/Time: 07/03/2018/2:52:07 PM     RIGHT/LEFT HEART CATH AND CORONARY ANGIOGRAPHY  Conclusion   1. No significant CAD.  2. Mildly elevated PCWP.  3. Preserved cardiac output.  4. 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  5. Severe mitral stenosis.  6. Moderate aortic stenosis.   Procedural Details   Technical Details Procedure: Right Heart Cath, Left Heart Cath, Selective Coronary Angiography  Indication: Assessment of mitral and aortic stenosis.    Procedural Details: The right groin was prepped, draped, and anesthetized with 1% lidocaine. Using the modified Seldinger technique a 5 French sheath was placed in the right femoral artery and a 7 French sheath was placed in the right femoral vein. A Swan-Ganz catheter was used for the right heart catheterization. Standard protocol was followed for recording of right heart pressures and sampling of oxygen saturations. Fick and thermodilution cardiac output was calculated. Standard Judkins catheters were used for selective coronary angiography.  The aortic valve  was crossed with a straight valve. There were no immediate procedural complications. The patient was transferred to the post catheterization recovery area for  further monitoring.   Estimated blood loss <50 mL.   During this procedure medications were administered to achieve and maintain moderate conscious sedation while the patient's heart rate, blood pressure, and oxygen saturation were continuously monitored and I was present face-to-face 100% of this time.  Medications  (Filter: Administrations occurring from 07/24/18 0717 to 07/24/18 0851)  Medication Rate/Dose/Volume Action  Date Time   midazolam (VERSED) injection (mg) 1 mg Given 07/24/18 0742   Total dose as of 09/23/18 1526 1 mg Given 0749   3 mg 1 mg Given 0805   fentaNYL (SUBLIMAZE) injection (mcg) 25 mcg Given 07/24/18 0742   Total dose as of 09/23/18 1526 25 mcg Given 0748   75 mcg 25 mcg Given 0805   lidocaine (PF) (XYLOCAINE) 1 % injection (mL) 10 mL Given 07/24/18 0755   Total dose as of 09/23/18 1526 10 mL Given 0755   20 mL        Heparin (Porcine) in NaCl 1000-0.9 UT/500ML-% SOLN (mL) 500 mL Given 07/24/18 0800   Total dose as of 09/23/18 1526 500 mL Given 0801   1,000 mL        iohexol (OMNIPAQUE) 350 MG/ML injection (mL) 50 mL Given 07/24/18 0830   Total dose as of 09/23/18 1526        50 mL        fentaNYL (SUBLIMAZE) injection (mcg) 25 mcg Given 07/24/18 0820   Total dose as of 09/23/18 1526        25 mcg        midazolam (VERSED) injection (mg) 1 mg Given 07/24/18 0820   Total dose as of 09/23/18 1526        1 mg        Sedation Time   Sedation Time Physician-1: 56 minutes 24 seconds  Coronary Findings   Diagnostic  Dominance: Right  Left Main  No significant disease.  Left Anterior Descending  Luminal irregularities.  Left Circumflex  Luminal irregularities.  Right Coronary Artery  Luminal irregularities.  Intervention   No interventions have been documented.  Right Heart   Right Heart  Pressures RHC Procedural Findings: Hemodynamics (mmHg) RA mean 4 RV 74/3 PA 64/21, mean 42 PCWP mean 19 LV 169/10 AO 158/73  Oxygen saturations: PA 67% AO 99%  Cardiac Output (Fick) 6.15  Cardiac Index (Fick) 3.64 PVR 3.7 WU  Cardiac Output (Thermo) 7.15 Cardiac Index (Thermo) 4.23  PVR 3.2 WU  Mitral valve:  Mean gradient 22 mmHg MVA (Fick) 1.38 cm^2 MVA (Thermo) 1.6 cm^2  Aortic valve:  Mean gradient 10 mmHg, AVA 1.73 cm^2 (simultaneous FA/LV) Mean gradient 24 mmHg, AVA 1.41 cm^2 (pull back)  Implants    No implant documentation for this case.  Syngo Images   Show images for CARDIAC CATHETERIZATION  Images on Long Term Storage   Show images for Rendi, [place] to Procedure Log   Procedure Log    Hemo Data    Most Recent Value  Fick Cardiac Output 6.15 L/min  Fick Cardiac Output Index 3.64 (L/min)/BSA  Thermal Cardiac Output 7.15 L/min  Thermal Cardiac Output Index 4.23 (L/min)/BSA  Aortic Mean Gradient 9.95 mmHg  Aortic Peak Gradient 5 mmHg  Aortic Valve Area 2.01  Aortic Value Area Index 1.19 cm2/BSA  Mitral Mean Gradient 21.87 mmHg  Mitral Peak Gradient 16 mmHg  Mitral Valve Area Index 0.95 cm2/BSA  RA A Wave 7 mmHg  RA V Wave 6 mmHg  RA Mean 4 mmHg  RV Systolic Pressure 74 mmHg  RV Diastolic Pressure 0 mmHg  RV EDP 3 mmHg  PA Systolic Pressure 64 mmHg  PA Diastolic Pressure 21 mmHg  PA Mean 42 mmHg  PW A Wave 25 mmHg  PW V Wave 15 mmHg  PW Mean 24 mmHg  AO Systolic Pressure 974 mmHg  AO Diastolic Pressure 73 mmHg  AO Mean 163 mmHg  LV Systolic Pressure 845 mmHg  LV Diastolic Pressure 4 mmHg  LV EDP 9 mmHg  AOp Systolic Pressure 364 mmHg  AOp Diastolic Pressure 72 mmHg  AOp Mean Pressure 680 mmHg  LVp Systolic Pressure 321 mmHg  LVp Diastolic Pressure 4 mmHg  LVp EDP Pressure 12 mmHg  TPVR Index 9.93 HRUI  TSVR Index 26.23 HRUI  PVR SVR Ratio 0.21  TPVR/TSVR Ratio 0.38    RIGHT/LEFT HEART CATH AND CORONARY ANGIOGRAPHY    Conclusion   1. No significant CAD.  2. Mildly elevated PCWP.  3. Preserved cardiac output.  4. 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  5. Severe mitral stenosis.  6. Moderate aortic stenosis.   Procedural Details   Technical Details Procedure: Right Heart Cath, Left Heart Cath, Selective Coronary Angiography  Indication: Assessment of mitral and aortic stenosis.    Procedural Details: The right groin was prepped, draped, and anesthetized with 1% lidocaine. Using the modified Seldinger technique a 5 French sheath was placed in the right femoral artery and a 7 French sheath was placed in the right femoral vein. A Swan-Ganz catheter was used for the right heart catheterization. Standard protocol was followed for recording of right heart pressures and sampling of oxygen saturations. Fick and thermodilution cardiac output was calculated. Standard Judkins catheters were used for selective coronary angiography.  The aortic valve was crossed with a straight valve. There were no immediate procedural complications. The patient was transferred to the post catheterization recovery area for further monitoring.   Estimated blood loss <50 mL.   During this procedure medications were administered to achieve and maintain moderate conscious sedation while the patient's heart rate, blood pressure, and oxygen saturation were continuously monitored and I was present face-to-face 100% of this time.  Medications  (Filter: Administrations occurring from 07/24/18 0717 to 07/24/18 0851)  Medication Rate/Dose/Volume Action  Date Time   midazolam (VERSED) injection (mg) 1 mg Given 07/24/18 0742   Total dose as of 09/23/18 1639 1 mg Given 0749   3 mg 1 mg Given 0805   fentaNYL (SUBLIMAZE) injection (mcg) 25 mcg Given 07/24/18 0742   Total dose as of 09/23/18 1639 25 mcg Given 0748   75 mcg 25 mcg Given 0805   lidocaine (PF) (XYLOCAINE) 1 % injection (mL) 10 mL Given 07/24/18 0755    Total dose as of 09/23/18 1639 10 mL Given 0755   20 mL        Heparin (Porcine) in NaCl 1000-0.9 UT/500ML-% SOLN (mL) 500 mL Given 07/24/18 0800   Total dose as of 09/23/18 1639 500 mL Given 0801   1,000 mL        iohexol (OMNIPAQUE) 350 MG/ML injection (mL) 50 mL Given 07/24/18 0830   Total dose as of 09/23/18 1639        50 mL        fentaNYL (SUBLIMAZE) injection (mcg) 25 mcg Given 07/24/18 0820   Total dose as of 09/23/18 1639        25 mcg        midazolam (VERSED) injection (mg) 1 mg Given  07/24/18 0820   Total dose as of 09/23/18 1639        1 mg        Sedation Time   Sedation Time Physician-1: 56 minutes 24 seconds  Coronary Findings   Diagnostic  Dominance: Right  Left Main  No significant disease.  Left Anterior Descending  Luminal irregularities.  Left Circumflex  Luminal irregularities.  Right Coronary Artery  Luminal irregularities.  Intervention   No interventions have been documented.  Right Heart   Right Heart Pressures RHC Procedural Findings: Hemodynamics (mmHg) RA mean 4 RV 74/3 PA 64/21, mean 42 PCWP mean 19 LV 169/10 AO 158/73  Oxygen saturations: PA 67% AO 99%  Cardiac Output (Fick) 6.15  Cardiac Index (Fick) 3.64 PVR 3.7 WU  Cardiac Output (Thermo) 7.15 Cardiac Index (Thermo) 4.23  PVR 3.2 WU  Mitral valve:  Mean gradient 22 mmHg MVA (Fick) 1.38 cm^2 MVA (Thermo) 1.6 cm^2  Aortic valve:  Mean gradient 10 mmHg, AVA 1.73 cm^2 (simultaneous FA/LV) Mean gradient 24 mmHg, AVA 1.41 cm^2 (pull back)  Implants    No implant documentation for this case.  Syngo Images   Show images for CARDIAC CATHETERIZATION  Images on Long Term Storage   Show images for Vassie, Kugel to Procedure Log   Procedure Log    Hemo Data    Most Recent Value  Fick Cardiac Output 6.15 L/min  Fick Cardiac Output Index 3.64 (L/min)/BSA  Thermal Cardiac Output 7.15 L/min  Thermal Cardiac Output Index 4.23 (L/min)/BSA  Aortic Mean  Gradient 9.95 mmHg  Aortic Peak Gradient 5 mmHg  Aortic Valve Area 2.01  Aortic Value Area Index 1.19 cm2/BSA  Mitral Mean Gradient 21.87 mmHg  Mitral Peak Gradient 16 mmHg  Mitral Valve Area Index 0.95 cm2/BSA  RA A Wave 7 mmHg  RA V Wave 6 mmHg  RA Mean 4 mmHg  RV Systolic Pressure 74 mmHg  RV Diastolic Pressure 0 mmHg  RV EDP 3 mmHg  PA Systolic Pressure 64 mmHg  PA Diastolic Pressure 21 mmHg  PA Mean 42 mmHg  PW A Wave 25 mmHg  PW V Wave 15 mmHg  PW Mean 24 mmHg  AO Systolic Pressure 867 mmHg  AO Diastolic Pressure 73 mmHg  AO Mean 619 mmHg  LV Systolic Pressure 509 mmHg  LV Diastolic Pressure 4 mmHg  LV EDP 9 mmHg  AOp Systolic Pressure 326 mmHg  AOp Diastolic Pressure 72 mmHg  AOp Mean Pressure 712 mmHg  LVp Systolic Pressure 458 mmHg  LVp Diastolic Pressure 4 mmHg  LVp EDP Pressure 12 mmHg  TPVR Index 9.93 HRUI  TSVR Index 26.23 HRUI  PVR SVR Ratio 0.21  TPVR/TSVR Ratio 0.38     Impression:  I have personally reviewed the patient's recent transthoracic echocardiogram and diagnostic cardiac catheterization as well as the transesophageal echocardiogram performed September 2019.  The patient has at least moderate aortic stenosis and severe mitral stenosis.  On recent transthoracic echocardiogram peak velocity across aortic valve measured greater than 4.0 m/s corresponding to mean transvalvular gradient estimated 33 mmHg.  There was moderate thickening and restricted leaflet mobility.  The DVI was relatively high at 0.37 suggesting relatively high flow.  The transesophageal echocardiogram performed last fall revealed findings consistent with Sievers type I bicuspid aortic valve.  There is severe calcification surrounding the mitral annulus with severely restricted leaflet mobility.  There is severe mitral stenosis.  Transvalvular gradients across the mitral valve have been estimated greater than 20 mmHg  both by echocardiogram and diagnostic cardiac catheterization.  TEE  performed last fall revealed heavy valvular calcification and mild to moderate mitral regurgitation.  Left ventricular systolic function remains preserved although the patient does have significant left ventricular hypertrophy and diastolic dysfunction.  Recent right heart catheterization revealed moderate pulmonary hypertension and mildly elevated pulmonary capillary wedge pressure with relatively high cardiac output.  Right heart size and function function appear normal and there is trivial tricuspid regurgitation.  Options include continued medical therapy with close observation versus proceeding with aortic and mitral valve replacement.  I do not feel that transcatheter aortic valve replacement with or without catheter-based approach to treatment of the patient's mitral valve disease would be a reasonable option.  The patient's mitral valve disease is quite severe and balloon mitral valvuloplasty does not appear to be an option and there are no commercially available transcatheter mitral valve devices currently available to treat native valve stenosis.  Risks associated with conventional surgery will unquestionably be high because of the patient's numerous comorbid medical problems, most notably including dialysis-dependent renal failure.  The severity of mitral valve and annular calcification may put her at relatively high risk for surgical complications including the development of paravalvular leak.  In addition, the patient has remote history of stroke and a history of atrial fibrillation has been mentioned on one occasion in the past, although the patient does not carry a diagnosis of recurrent paroxysmal atrial fibrillation and she is not currently on long-term anticoagulation.  Risk of stroke may be significant.   Plan:  The patient and her mother were counseled at length regarding treatment alternatives for management of severe symptomatic mitral stenosis and moderate aortic stenosis. Alternative  approaches such as conventional aortic and mitral valve replacement and continued medical therapy without intervention were compared and contrasted at length.  The risks associated with relatively high-risk double valve replacement were discussed in detail, as were expectations for her post-operative convalescence.  Discussion was held comparing the relative risks of mechanical valve replacement with need for lifelong anticoagulation versus use of a bioprosthetic tissue valve and the associated potential for late structural valve deterioration and failure.  This discussion was placed in the context of the patient's particular circumstances including dialysis-dependent renal failure.  All of her questions have been addressed.  At this point the patient desires to wait for at least a month before proceeding with elective surgery with the hope that any potential risks related to the ongoing COVID-19 pandemic will be further mitigated.  Under the circumstances this seems quite reasonable.  The patient has been strongly encouraged to find a way to quit smoking.  She will plan to return to our office in approximately 1 month.  Prior to that she will undergo cardiac gated CT angiogram of the heart to further evaluate the anatomical characteristics of the aortic valve, aortic root, mitral valve, and the aorto mitral curtain to facilitate surgical planning.  The patient will discuss with Dr. Harrington Challenger and her nephrologist issues related to prosthetic valve choice and possible need for long-term anticoagulation using warfarin.  All of her questions have been addressed.   I spent in excess of 90 minutes during the conduct of this office consultation and >50% of this time involved direct face-to-face encounter with the patient for counseling and/or coordination of their care.   Valentina Gu. Roxy Manns, MD 09/23/2018 3:25 PM

## 2018-09-23 NOTE — Patient Instructions (Addendum)
Continue all previous medications without any changes at this time  Stop smoking immediately and permanently.  

## 2018-09-24 DIAGNOSIS — M3210 Systemic lupus erythematosus, organ or system involvement unspecified: Secondary | ICD-10-CM | POA: Diagnosis not present

## 2018-09-24 DIAGNOSIS — N186 End stage renal disease: Secondary | ICD-10-CM | POA: Diagnosis not present

## 2018-09-24 DIAGNOSIS — D631 Anemia in chronic kidney disease: Secondary | ICD-10-CM | POA: Diagnosis not present

## 2018-09-24 DIAGNOSIS — D509 Iron deficiency anemia, unspecified: Secondary | ICD-10-CM | POA: Diagnosis not present

## 2018-09-24 DIAGNOSIS — N2581 Secondary hyperparathyroidism of renal origin: Secondary | ICD-10-CM | POA: Diagnosis not present

## 2018-09-26 DIAGNOSIS — D509 Iron deficiency anemia, unspecified: Secondary | ICD-10-CM | POA: Diagnosis not present

## 2018-09-26 DIAGNOSIS — D631 Anemia in chronic kidney disease: Secondary | ICD-10-CM | POA: Diagnosis not present

## 2018-09-26 DIAGNOSIS — M3210 Systemic lupus erythematosus, organ or system involvement unspecified: Secondary | ICD-10-CM | POA: Diagnosis not present

## 2018-09-26 DIAGNOSIS — N2581 Secondary hyperparathyroidism of renal origin: Secondary | ICD-10-CM | POA: Diagnosis not present

## 2018-09-26 DIAGNOSIS — N186 End stage renal disease: Secondary | ICD-10-CM | POA: Diagnosis not present

## 2018-09-29 DIAGNOSIS — D509 Iron deficiency anemia, unspecified: Secondary | ICD-10-CM | POA: Diagnosis not present

## 2018-09-29 DIAGNOSIS — N2581 Secondary hyperparathyroidism of renal origin: Secondary | ICD-10-CM | POA: Diagnosis not present

## 2018-09-29 DIAGNOSIS — N186 End stage renal disease: Secondary | ICD-10-CM | POA: Diagnosis not present

## 2018-09-29 DIAGNOSIS — D631 Anemia in chronic kidney disease: Secondary | ICD-10-CM | POA: Diagnosis not present

## 2018-09-29 DIAGNOSIS — M3210 Systemic lupus erythematosus, organ or system involvement unspecified: Secondary | ICD-10-CM | POA: Diagnosis not present

## 2018-10-01 DIAGNOSIS — N186 End stage renal disease: Secondary | ICD-10-CM | POA: Diagnosis not present

## 2018-10-01 DIAGNOSIS — D509 Iron deficiency anemia, unspecified: Secondary | ICD-10-CM | POA: Diagnosis not present

## 2018-10-01 DIAGNOSIS — D631 Anemia in chronic kidney disease: Secondary | ICD-10-CM | POA: Diagnosis not present

## 2018-10-01 DIAGNOSIS — M3210 Systemic lupus erythematosus, organ or system involvement unspecified: Secondary | ICD-10-CM | POA: Diagnosis not present

## 2018-10-01 DIAGNOSIS — N2581 Secondary hyperparathyroidism of renal origin: Secondary | ICD-10-CM | POA: Diagnosis not present

## 2018-10-03 DIAGNOSIS — N186 End stage renal disease: Secondary | ICD-10-CM | POA: Diagnosis not present

## 2018-10-03 DIAGNOSIS — N2581 Secondary hyperparathyroidism of renal origin: Secondary | ICD-10-CM | POA: Diagnosis not present

## 2018-10-03 DIAGNOSIS — D631 Anemia in chronic kidney disease: Secondary | ICD-10-CM | POA: Diagnosis not present

## 2018-10-03 DIAGNOSIS — D509 Iron deficiency anemia, unspecified: Secondary | ICD-10-CM | POA: Diagnosis not present

## 2018-10-03 DIAGNOSIS — M3210 Systemic lupus erythematosus, organ or system involvement unspecified: Secondary | ICD-10-CM | POA: Diagnosis not present

## 2018-10-06 DIAGNOSIS — N2581 Secondary hyperparathyroidism of renal origin: Secondary | ICD-10-CM | POA: Diagnosis not present

## 2018-10-06 DIAGNOSIS — D631 Anemia in chronic kidney disease: Secondary | ICD-10-CM | POA: Diagnosis not present

## 2018-10-06 DIAGNOSIS — M3210 Systemic lupus erythematosus, organ or system involvement unspecified: Secondary | ICD-10-CM | POA: Diagnosis not present

## 2018-10-06 DIAGNOSIS — N186 End stage renal disease: Secondary | ICD-10-CM | POA: Diagnosis not present

## 2018-10-06 DIAGNOSIS — D509 Iron deficiency anemia, unspecified: Secondary | ICD-10-CM | POA: Diagnosis not present

## 2018-10-08 DIAGNOSIS — D509 Iron deficiency anemia, unspecified: Secondary | ICD-10-CM | POA: Diagnosis not present

## 2018-10-08 DIAGNOSIS — D631 Anemia in chronic kidney disease: Secondary | ICD-10-CM | POA: Diagnosis not present

## 2018-10-08 DIAGNOSIS — N186 End stage renal disease: Secondary | ICD-10-CM | POA: Diagnosis not present

## 2018-10-08 DIAGNOSIS — N2581 Secondary hyperparathyroidism of renal origin: Secondary | ICD-10-CM | POA: Diagnosis not present

## 2018-10-08 DIAGNOSIS — M3210 Systemic lupus erythematosus, organ or system involvement unspecified: Secondary | ICD-10-CM | POA: Diagnosis not present

## 2018-10-10 DIAGNOSIS — D631 Anemia in chronic kidney disease: Secondary | ICD-10-CM | POA: Diagnosis not present

## 2018-10-10 DIAGNOSIS — D509 Iron deficiency anemia, unspecified: Secondary | ICD-10-CM | POA: Diagnosis not present

## 2018-10-10 DIAGNOSIS — N2581 Secondary hyperparathyroidism of renal origin: Secondary | ICD-10-CM | POA: Diagnosis not present

## 2018-10-10 DIAGNOSIS — M3210 Systemic lupus erythematosus, organ or system involvement unspecified: Secondary | ICD-10-CM | POA: Diagnosis not present

## 2018-10-10 DIAGNOSIS — N186 End stage renal disease: Secondary | ICD-10-CM | POA: Diagnosis not present

## 2018-10-13 DIAGNOSIS — N186 End stage renal disease: Secondary | ICD-10-CM | POA: Diagnosis not present

## 2018-10-13 DIAGNOSIS — N2581 Secondary hyperparathyroidism of renal origin: Secondary | ICD-10-CM | POA: Diagnosis not present

## 2018-10-13 DIAGNOSIS — M3210 Systemic lupus erythematosus, organ or system involvement unspecified: Secondary | ICD-10-CM | POA: Diagnosis not present

## 2018-10-13 DIAGNOSIS — D509 Iron deficiency anemia, unspecified: Secondary | ICD-10-CM | POA: Diagnosis not present

## 2018-10-13 DIAGNOSIS — D631 Anemia in chronic kidney disease: Secondary | ICD-10-CM | POA: Diagnosis not present

## 2018-10-15 DIAGNOSIS — D509 Iron deficiency anemia, unspecified: Secondary | ICD-10-CM | POA: Diagnosis not present

## 2018-10-15 DIAGNOSIS — N2581 Secondary hyperparathyroidism of renal origin: Secondary | ICD-10-CM | POA: Diagnosis not present

## 2018-10-15 DIAGNOSIS — M3210 Systemic lupus erythematosus, organ or system involvement unspecified: Secondary | ICD-10-CM | POA: Diagnosis not present

## 2018-10-15 DIAGNOSIS — D631 Anemia in chronic kidney disease: Secondary | ICD-10-CM | POA: Diagnosis not present

## 2018-10-15 DIAGNOSIS — N186 End stage renal disease: Secondary | ICD-10-CM | POA: Diagnosis not present

## 2018-10-17 DIAGNOSIS — N2581 Secondary hyperparathyroidism of renal origin: Secondary | ICD-10-CM | POA: Diagnosis not present

## 2018-10-17 DIAGNOSIS — N186 End stage renal disease: Secondary | ICD-10-CM | POA: Diagnosis not present

## 2018-10-17 DIAGNOSIS — M3210 Systemic lupus erythematosus, organ or system involvement unspecified: Secondary | ICD-10-CM | POA: Diagnosis not present

## 2018-10-17 DIAGNOSIS — D631 Anemia in chronic kidney disease: Secondary | ICD-10-CM | POA: Diagnosis not present

## 2018-10-17 DIAGNOSIS — D509 Iron deficiency anemia, unspecified: Secondary | ICD-10-CM | POA: Diagnosis not present

## 2018-10-20 DIAGNOSIS — Z992 Dependence on renal dialysis: Secondary | ICD-10-CM | POA: Diagnosis not present

## 2018-10-20 DIAGNOSIS — M3210 Systemic lupus erythematosus, organ or system involvement unspecified: Secondary | ICD-10-CM | POA: Diagnosis not present

## 2018-10-20 DIAGNOSIS — D509 Iron deficiency anemia, unspecified: Secondary | ICD-10-CM | POA: Diagnosis not present

## 2018-10-20 DIAGNOSIS — N186 End stage renal disease: Secondary | ICD-10-CM | POA: Diagnosis not present

## 2018-10-20 DIAGNOSIS — D631 Anemia in chronic kidney disease: Secondary | ICD-10-CM | POA: Diagnosis not present

## 2018-10-20 DIAGNOSIS — N2581 Secondary hyperparathyroidism of renal origin: Secondary | ICD-10-CM | POA: Diagnosis not present

## 2018-10-20 DIAGNOSIS — I120 Hypertensive chronic kidney disease with stage 5 chronic kidney disease or end stage renal disease: Secondary | ICD-10-CM | POA: Diagnosis not present

## 2018-10-22 ENCOUNTER — Telehealth (HOSPITAL_COMMUNITY): Payer: Self-pay | Admitting: Emergency Medicine

## 2018-10-22 DIAGNOSIS — N186 End stage renal disease: Secondary | ICD-10-CM | POA: Diagnosis not present

## 2018-10-22 DIAGNOSIS — M3210 Systemic lupus erythematosus, organ or system involvement unspecified: Secondary | ICD-10-CM | POA: Diagnosis not present

## 2018-10-22 DIAGNOSIS — D631 Anemia in chronic kidney disease: Secondary | ICD-10-CM | POA: Diagnosis not present

## 2018-10-22 DIAGNOSIS — N2581 Secondary hyperparathyroidism of renal origin: Secondary | ICD-10-CM | POA: Diagnosis not present

## 2018-10-22 DIAGNOSIS — D509 Iron deficiency anemia, unspecified: Secondary | ICD-10-CM | POA: Diagnosis not present

## 2018-10-22 NOTE — Telephone Encounter (Signed)
Left message on voicemail with name and callback number Carolos Fecher RN Navigator Cardiac Imaging [facility] [phone] Office [phone] Cell  

## 2018-10-22 NOTE — Telephone Encounter (Signed)
Pt returning phone call regarding upcoming cardiac imaging study; pt verbalizes understanding of appt date/time, parking situation and where to check in, pre-test NPO status and medications ordered, and verified current allergies; name and call back number provided for further questions should they arise Marchia Bond RN [facility] [phone] office [phone] cell  Pt denies covid symptoms, verbalized understanding of visitor policy.

## 2018-10-23 ENCOUNTER — Other Ambulatory Visit: Payer: Self-pay

## 2018-10-23 ENCOUNTER — Ambulatory Visit (HOSPITAL_COMMUNITY)
Admission: RE | Admit: 2018-10-23 | Discharge: 2018-10-23 | Disposition: A | Discharge status: Home / Self Care | Payer: Medicare Other | Source: Ambulatory Visit | Attending: Thoracic Surgery (Cardiothoracic Vascular Surgery) | Admitting: Thoracic Surgery (Cardiothoracic Vascular Surgery)

## 2018-10-23 ENCOUNTER — Encounter (HOSPITAL_COMMUNITY): Payer: Self-pay

## 2018-10-23 DIAGNOSIS — I350 Nonrheumatic aortic (valve) stenosis: Secondary | ICD-10-CM

## 2018-10-23 NOTE — Progress Notes (Signed)
Patient attempted to complete CT. Patient stated she will need to come back another day, stated "I need to get my nerves together". Patient encouraged and offered many distraction techniques, patient still unable to complete testing. CT navigator Clarise Cruz, RN made aware.

## 2018-10-24 DIAGNOSIS — N2581 Secondary hyperparathyroidism of renal origin: Secondary | ICD-10-CM | POA: Diagnosis not present

## 2018-10-24 DIAGNOSIS — D509 Iron deficiency anemia, unspecified: Secondary | ICD-10-CM | POA: Diagnosis not present

## 2018-10-24 DIAGNOSIS — M3210 Systemic lupus erythematosus, organ or system involvement unspecified: Secondary | ICD-10-CM | POA: Diagnosis not present

## 2018-10-24 DIAGNOSIS — D631 Anemia in chronic kidney disease: Secondary | ICD-10-CM | POA: Diagnosis not present

## 2018-10-24 DIAGNOSIS — N186 End stage renal disease: Secondary | ICD-10-CM | POA: Diagnosis not present

## 2018-10-27 ENCOUNTER — Ambulatory Visit: Payer: Medicare Other | Admitting: Thoracic Surgery (Cardiothoracic Vascular Surgery)

## 2018-10-27 DIAGNOSIS — M542 Cervicalgia: Secondary | ICD-10-CM | POA: Insufficient documentation

## 2018-10-27 DIAGNOSIS — M3210 Systemic lupus erythematosus, organ or system involvement unspecified: Secondary | ICD-10-CM | POA: Diagnosis not present

## 2018-10-27 DIAGNOSIS — N186 End stage renal disease: Secondary | ICD-10-CM | POA: Diagnosis not present

## 2018-10-27 DIAGNOSIS — D631 Anemia in chronic kidney disease: Secondary | ICD-10-CM | POA: Diagnosis not present

## 2018-10-27 DIAGNOSIS — D509 Iron deficiency anemia, unspecified: Secondary | ICD-10-CM | POA: Diagnosis not present

## 2018-10-27 DIAGNOSIS — N2581 Secondary hyperparathyroidism of renal origin: Secondary | ICD-10-CM | POA: Diagnosis not present

## 2018-10-29 ENCOUNTER — Other Ambulatory Visit (HOSPITAL_COMMUNITY): Payer: Self-pay | Admitting: Emergency Medicine

## 2018-10-29 ENCOUNTER — Telehealth (HOSPITAL_COMMUNITY): Payer: Self-pay | Admitting: Emergency Medicine

## 2018-10-29 DIAGNOSIS — N2581 Secondary hyperparathyroidism of renal origin: Secondary | ICD-10-CM | POA: Diagnosis not present

## 2018-10-29 DIAGNOSIS — Z8659 Personal history of other mental and behavioral disorders: Secondary | ICD-10-CM

## 2018-10-29 DIAGNOSIS — D509 Iron deficiency anemia, unspecified: Secondary | ICD-10-CM | POA: Diagnosis not present

## 2018-10-29 DIAGNOSIS — N186 End stage renal disease: Secondary | ICD-10-CM | POA: Diagnosis not present

## 2018-10-29 DIAGNOSIS — M3210 Systemic lupus erythematosus, organ or system involvement unspecified: Secondary | ICD-10-CM | POA: Diagnosis not present

## 2018-10-29 DIAGNOSIS — D631 Anemia in chronic kidney disease: Secondary | ICD-10-CM | POA: Diagnosis not present

## 2018-10-29 MED ORDER — DIAZEPAM 5 MG PO TABS: 5.0000 mg | ORAL_TABLET | Freq: Once | ORAL | Status: DC

## 2018-10-29 NOTE — Telephone Encounter (Signed)
Reaching out to patient to offer assistance regarding upcoming cardiac imaging study; pt verbalizes understanding of appt date/time, parking situation and where to check in, pre-test NPO status and medications ordered, and verified current allergies; name and call back number provided for further questions should they arise Marchia Bond RN Navigator Cardiac Imaging [facility] [phone] office [phone] cell  Pt denies covid symptoms, will have a ride to and from appointment d/t having valium for cardiac CT

## 2018-10-30 ENCOUNTER — Other Ambulatory Visit: Payer: Self-pay

## 2018-10-30 ENCOUNTER — Ambulatory Visit (HOSPITAL_COMMUNITY): Payer: Medicare Other

## 2018-10-30 ENCOUNTER — Ambulatory Visit (HOSPITAL_COMMUNITY)
Admission: RE | Admit: 2018-10-30 | Discharge: 2018-10-30 | Disposition: A | Discharge status: Home / Self Care | Payer: Medicare Other | Source: Ambulatory Visit | Attending: Thoracic Surgery (Cardiothoracic Vascular Surgery) | Admitting: Thoracic Surgery (Cardiothoracic Vascular Surgery)

## 2018-10-30 ENCOUNTER — Other Ambulatory Visit (HOSPITAL_COMMUNITY): Payer: Self-pay | Admitting: Emergency Medicine

## 2018-10-30 DIAGNOSIS — Z8659 Personal history of other mental and behavioral disorders: Secondary | ICD-10-CM

## 2018-10-30 DIAGNOSIS — I350 Nonrheumatic aortic (valve) stenosis: Secondary | ICD-10-CM

## 2018-10-30 DIAGNOSIS — N859 Noninflammatory disorder of uterus, unspecified: Secondary | ICD-10-CM | POA: Diagnosis not present

## 2018-10-30 DIAGNOSIS — K5730 Diverticulosis of large intestine without perforation or abscess without bleeding: Secondary | ICD-10-CM | POA: Diagnosis not present

## 2018-10-30 DIAGNOSIS — Z954 Presence of other heart-valve replacement: Secondary | ICD-10-CM | POA: Diagnosis not present

## 2018-10-30 MED ORDER — DIAZEPAM 5 MG PO TABS
ORAL_TABLET | ORAL | Status: AC
  Administered 2018-10-30: 5 mg
  Filled 2018-10-30: qty 1

## 2018-10-30 MED ORDER — DIAZEPAM 5 MG PO TABS: 5.0000 mg | ORAL_TABLET | Freq: Once | ORAL | Status: DC

## 2018-10-30 MED ORDER — IOHEXOL 350 MG/ML SOLN
100.0000 mL | Freq: Once | INTRAVENOUS | Status: AC | PRN
  Administered 2018-10-30: 100 mL via INTRAVENOUS

## 2018-10-31 DIAGNOSIS — D509 Iron deficiency anemia, unspecified: Secondary | ICD-10-CM | POA: Diagnosis not present

## 2018-10-31 DIAGNOSIS — N2581 Secondary hyperparathyroidism of renal origin: Secondary | ICD-10-CM | POA: Diagnosis not present

## 2018-10-31 DIAGNOSIS — M542 Cervicalgia: Secondary | ICD-10-CM | POA: Diagnosis not present

## 2018-10-31 DIAGNOSIS — M3210 Systemic lupus erythematosus, organ or system involvement unspecified: Secondary | ICD-10-CM | POA: Diagnosis not present

## 2018-10-31 DIAGNOSIS — N186 End stage renal disease: Secondary | ICD-10-CM | POA: Diagnosis not present

## 2018-10-31 DIAGNOSIS — D631 Anemia in chronic kidney disease: Secondary | ICD-10-CM | POA: Diagnosis not present

## 2018-11-03 DIAGNOSIS — N186 End stage renal disease: Secondary | ICD-10-CM | POA: Diagnosis not present

## 2018-11-03 DIAGNOSIS — N2581 Secondary hyperparathyroidism of renal origin: Secondary | ICD-10-CM | POA: Diagnosis not present

## 2018-11-03 DIAGNOSIS — D631 Anemia in chronic kidney disease: Secondary | ICD-10-CM | POA: Diagnosis not present

## 2018-11-03 DIAGNOSIS — M3210 Systemic lupus erythematosus, organ or system involvement unspecified: Secondary | ICD-10-CM | POA: Diagnosis not present

## 2018-11-03 DIAGNOSIS — D509 Iron deficiency anemia, unspecified: Secondary | ICD-10-CM | POA: Diagnosis not present

## 2018-11-04 ENCOUNTER — Ambulatory Visit (INDEPENDENT_AMBULATORY_CARE_PROVIDER_SITE_OTHER): Payer: Medicare Other | Admitting: Thoracic Surgery (Cardiothoracic Vascular Surgery)

## 2018-11-04 ENCOUNTER — Encounter: Payer: Self-pay | Admitting: Thoracic Surgery (Cardiothoracic Vascular Surgery)

## 2018-11-04 ENCOUNTER — Other Ambulatory Visit: Payer: Self-pay

## 2018-11-04 VITALS — BP 189/98 | HR 79 | Temp 97.5°F | Resp 16 | Ht 66.0 in | Wt 140.2 lb

## 2018-11-04 DIAGNOSIS — N186 End stage renal disease: Secondary | ICD-10-CM

## 2018-11-04 DIAGNOSIS — I5032 Chronic diastolic (congestive) heart failure: Secondary | ICD-10-CM | POA: Diagnosis not present

## 2018-11-04 DIAGNOSIS — Z992 Dependence on renal dialysis: Secondary | ICD-10-CM

## 2018-11-04 DIAGNOSIS — I350 Nonrheumatic aortic (valve) stenosis: Secondary | ICD-10-CM

## 2018-11-04 DIAGNOSIS — I050 Rheumatic mitral stenosis: Secondary | ICD-10-CM

## 2018-11-04 NOTE — Patient Instructions (Signed)
Stop taking aspirin 7 days prior to surgery  Continue taking all other medications without change through the day before surgery.  Have nothing to eat or drink after midnight the night before surgery.  On the morning of surgery take only clonidine, labetalol and protonix with a sip of water.

## 2018-11-04 NOTE — Progress Notes (Addendum)
[address]       [place] [address]             [phone]     CARDIOTHORACIC SURGERY OFFICE NOTE  Referring Provider is Fay Records, MD  Primary Nephrologist is [name], [name], MD PCP is Benito Mccreedy, MD   HPI:  Patient is a 58 year old African-American female with history of aortic stenosis, mitral stenosis, end-stage renal disease on hemodialysis, hypertension, lupus, remote history of stroke, dysfunctional uterine bleeding, peptic ulcer disease, and chronic anemia who returns to the office today to further discuss possible surgical treatment of severe symptomatic aortic stenosis and mitral stenosis.  She was originally seen in consultation on Sep 23, 2018.  At that time the patient wished to delay any further intervention for at least a few weeks because of the ongoing COVID-19 pandemic.  She recently underwent CT angiography and she refers returns to the office today for follow-up.  She reports no new problems or complaints over the past few weeks.  She continues to experience exertional shortness of breath and fatigue with moderate level and occasionally low-level activity, consistent with chronic diastolic congestive heart failure, New York Heart Association functional class IIb-III.  She denies any recent acute exacerbations of shortness of breath at rest.  She denies any fevers or productive cough.  She does not experience any chest pain or chest tightness.  She has not had palpitations, dizzy spells, nor any syncopal episodes.  She has not recently had any problems with routine dialysis treatments including no specific problems with volume removal or significant hypotension during treatments.  The remainder of her review of systems is unchanged.   Current Outpatient Medications  Medication Sig Dispense Refill   acetaminophen (TYLENOL) 500 MG tablet Take 1,000 mg by mouth every 6 (six) hours as needed for headache (pain).      amLODipine (NORVASC)  10 MG tablet Take 1 tablet (10 mg total) by mouth at bedtime. 30 tablet 4   aspirin EC 81 MG tablet Take 81 mg by mouth at bedtime.     calcitRIOL (ROCALTROL) 0.25 MCG capsule Take 7 capsules (1.75 mcg total) by mouth every other day. 14 capsule 0   cloNIDine (CATAPRES) 0.2 MG tablet Take 1 tablet (0.2 mg) by mouth twice daily - on dialysis days take after dialysis and at bedtime, on other days take one in the morning and one at night 180 tablet 3   ferric citrate (AURYXIA) 1 GM 210 MG(Fe) tablet Take 420-840 mg by mouth See admin instructions. Take 420-840 mg with each meal, take 420 mg with each snack     guaiFENesin (MUCINEX) 600 MG 12 hr tablet Take 300 mg by mouth 2 (two) times daily as needed for cough or to loosen phlegm.     labetalol (NORMODYNE) 300 MG tablet Take 1 tablet (300 mg total) by mouth 2 (two) times daily. 90 tablet 3   lanthanum (FOSRENOL) 1000 MG chewable tablet Chew 2 tablets (2,000 mg total) by mouth 3 (three) times daily with meals. 180 tablet 0   multivitamin (RENA-VIT) TABS tablet Take 1 tablet by mouth at bedtime.      ondansetron (ZOFRAN) 4 MG tablet Take 4 mg by mouth every 6 (six) hours as needed for nausea or vomiting.     oxyCODONE-acetaminophen (PERCOCET) 7.5-325 MG tablet Take 1 tablet by mouth every 6 (six) hours as needed for severe pain. 15 tablet 0   pantoprazole (PROTONIX) 40 MG tablet  Take 40 mg by mouth 2 (two) times daily.      hydrALAZINE (APRESOLINE) 25 MG tablet Take 1 tablet (25 mg total) by mouth 3 (three) times daily. (Patient not taking: Reported on 10/30/2018) 270 tablet 3   No current facility-administered medications for this visit.       Physical Exam:   BP (!) 189/98 (BP Location: Left Arm, Patient Position: Sitting, Cuff Size: Normal)    Pulse 79    Temp (!) 97.5 F (36.4 C) (Other (Comment)) Comment (Src): THERMAL   Resp 16    Ht 5\' 6"  (1.676 m)    Wt 140 lb 3.2 oz (63.6 kg)    LMP 12/05/2010 (LMP Unknown)    SpO2 98% Comment:  RA   BMI 22.63 kg/m   General:  AA female NAD  Chest:   Clear  CV:   Regular rate and rhythm with systolic murmur  Incisions:  n/a  Abdomen:  Soft nontender  Extremities:  Warm, adequately perfused, no edema  Diagnostic Tests:  Cardiac TAVR CT  TECHNIQUE: The patient was scanned on a [employer]. A 120 kV retrospective scan was triggered in the descending thoracic aorta at 111 HU's. Gantry rotation speed was 250 msecs and collimation was .6 mm. No beta blockade or nitro were given. The 3D data set was reconstructed in 5% intervals of the R-R cycle. Systolic and diastolic phases were analyzed on a dedicated work station using MPR, MIP and VRT modes. The patient received 80 cc of contrast.  FINDINGS: Aortic Valve: Trileaflet aortic valve with moderate leaflet thickening, mild calcifications, moderate leaflet opening restrictions and only minimal calcifications extending into the LVOT.  Aorta: Normal size with mild atherosclerotic plaque and calcifications in the thoracic aorta and moderate to severe, almost circumferential calcifications in the distal abdominal aorta extending into iliofemoral arteries bilaterally  Sinotubular Junction: 28 x 28 mm  Ascending Thoracic Aorta: 35 x 34 mm  Aortic Arch: 30 x 28 mm  Descending Thoracic Aorta: 24 x 24 mm  Sinus of Valsalva Measurements:  Non-coronary: 30 mm  Right -coronary: 27 mm  Left -coronary: 31 mm  Coronary Artery Height above Annulus:  Left Main: 8 mm  Right Coronary: 14 mm  Virtual Basal Annulus Measurements:  Maximum/Minimum Diameter: 27.2 x 18.8 mm  Mean Diameter: 22.2 mm  Perimeter: 72.1 mm  Area: 388 mm2  Optimum Fluoroscopic Angle for Delivery: LAO 8 CAU 6  IMPRESSION: 1. Trileaflet aortic valve with moderate leaflet thickening, mild calcifications, moderate leaflet opening restrictions and only minimal calcifications extending into the LVOT. Annular  measurements are suitable for delivery of a 23 mm Edwards-SAPIEN 3 valve.  2. Sufficient coronary to annulus distance.  3. Optimum Fluoroscopic Angle for Delivery:  LAO 8 CAU 6.  4. Aorta: Normal size with mild atherosclerotic plaque and calcifications in the thoracic aorta and moderate to severe, almost circumferential calcifications in the distal abdominal aorta extending into iliofemoral arteries bilaterally  5. No thrombus in the left atrial appendage.  6. Severe circumferential mitral annular calcifications.  Electronically Signed: By: Ena Dawley On: 10/30/2018 14:36   CT ANGIOGRAPHY CHEST, ABDOMEN AND PELVIS  TECHNIQUE: Multidetector CT imaging through the chest, abdomen and pelvis was performed using the standard protocol during bolus administration of intravenous contrast. Multiplanar reconstructed images and MIPs were obtained and reviewed to evaluate the vascular anatomy.  CONTRAST:  136mL OMNIPAQUE IOHEXOL 350 MG/ML SOLN  COMPARISON:  CT the abdomen and pelvis 05/15/2017. No prior chest CT.  FINDINGS: CTA CHEST FINDINGS  Cardiovascular: Heart size is enlarged. There is no significant pericardial fluid, thickening or pericardial calcification. There is aortic atherosclerosis, as well as atherosclerosis of the great vessels of the mediastinum and the coronary arteries, including calcified atherosclerotic plaque in the left anterior descending, left circumflex and right coronary arteries. Thickening calcification of the aortic valve. Severe calcifications of the mitral valve and mitral annulus.  Mediastinum/Lymph Nodes: No pathologically enlarged mediastinal or hilar lymph nodes. Esophagus is unremarkable in appearance. No axillary lymphadenopathy.  Lungs/Pleura: Small calcified granuloma in the right middle lobe. No other suspicious appearing pulmonary nodules or masses are noted. No acute consolidative airspace disease. No pleural  effusions. Mild linear scarring or subsegmental atelectasis in the left lower lobe.  Musculoskeletal/Soft Tissues: There are no aggressive appearing lytic or blastic lesions noted in the visualized portions of the skeleton.  CTA ABDOMEN AND PELVIS FINDINGS  Hepatobiliary: No suspicious cystic or solid hepatic lesions. No intra or extrahepatic biliary ductal dilatation. Gallbladder is normal in appearance.  Pancreas: No pancreatic mass. No pancreatic ductal dilatation. No pancreatic or peripancreatic fluid or inflammatory changes.  Spleen: Numerous calcified granulomas throughout the spleen.  Adrenals/Urinary Tract: Multiple low-attenuation lesions are noted within both kidneys, largest of which are all compatible with cysts, with the largest of these measuring up to 2.9 cm in the lower pole of the left kidney. Most of these are simple in appearance. This largest lesion does have a small calcification in the inferior aspect of the wall (coronal image 83 of series 16). No hydroureteronephrosis. Urinary bladder is normal in appearance. Bilateral adrenal glands are normal in appearance.  Stomach/Bowel: Normal appearance of the stomach. No pathologic dilatation of small bowel or colon. Numerous colonic diverticulae are noted, without surrounding inflammatory changes to suggest an acute diverticulitis at this time. Normal appendix.  Vascular/Lymphatic: Aortic atherosclerosis, with vascular findings and measurements pertinent to potential TAVR procedure, as detailed below. No aneurysm or dissection in the abdominal or pelvic vasculature. No lymphadenopathy noted in the abdomen or pelvis.  Reproductive: Several densely calcified lesions are noted in the uterus, largest of which is in the fundus measuring 2.5 cm in diameter, presumably small fibroids. Ovaries are a trophic.  Other: No significant volume of ascites.  No pneumoperitoneum.  Musculoskeletal: There are no  aggressive appearing lytic or blastic lesions noted in the visualized portions of the skeleton.  VASCULAR MEASUREMENTS PERTINENT TO TAVR:  AORTA:  Minimal Aortic Diameter-12 x 14 mm  Severity of Aortic Calcification-severe  RIGHT PELVIS:  Right Common Iliac Artery -  Minimal Diameter-8.2 x 6.3 mm  Tortuosity - mild  Calcification-moderate to severe  Right External Iliac Artery -  Minimal Diameter-7.2 x 5.2 mm  Tortuosity - mild  Calcification - mild  Right Common Femoral Artery -  Minimal Diameter-6.5 x 5.6 mm  Tortuosity - mild  Calcification-moderate  LEFT PELVIS:  Left Common Iliac Artery -  Minimal Diameter-8.0 x 7.5 mm  Tortuosity-mild  Calcification-moderate  Left External Iliac Artery -  Minimal Diameter-6.2 x 5.3 mm  Tortuosity - mild  Calcification - mild  Left Common Femoral Artery -  Minimal Diameter-6.6 x 5.9 mm  Tortuosity - mild  Calcification - mild  Review of the MIP images confirms the above findings.  IMPRESSION: 1. Vascular findings and measurements pertinent to potential TAVR procedure, as detailed above. 2. Thickening calcification of the aortic valve, compatible with the reported clinical history of severe aortic stenosis. 3. There is also a very severe thickening calcification  of the mitral valve and mitral annulus. 4. Aortic atherosclerosis, in addition to three-vessel coronary artery disease. Please note that although the presence of coronary artery calcium documents the presence of coronary artery disease, the severity of this disease and any potential stenosis cannot be assessed on this non-gated CT examination. Assessment for potential risk factor modification, dietary therapy or pharmacologic therapy may be warranted, if clinically indicated. 5. Colonic diverticulosis without evidence of acute diverticulitis at this time. 6. Additional incidental findings, as  above.   Electronically Signed   By: Vinnie Langton M.D.   On: 10/30/2018 16:32    Impression:  Patient has stage D severe symptomatic aortic and mitral stenosis.  She describe stable symptoms of exertional shortness of breath and fatigue consistent with chronic diastolic congestive heart failure, New York Heart Association functional class IIb-III.  I have personally reviewed the patient's most recent transthoracic echocardiogram, diagnostic cardiac catheterization and CT angiograms as well as the transesophageal echocardiogram performed September 2019.  She has at least moderate aortic stenosis and severe mitral stenosis.  On recent transthoracic echocardiogram peak velocity across aortic valve measured greater than 4.0 m/s corresponding to mean transvalvular gradient estimated 33 mmHg.  There was moderate thickening and restricted leaflet mobility.  The DVI was relatively high at 0.37 suggesting relatively high flow.  The transesophageal echocardiogram performed last fall revealed severe calcification surrounding the mitral annulus with severely restricted leaflet mobility.  There is severe mitral stenosis and mild to moderate mitral regurgitation.  Transvalvular gradients across the mitral valve have been estimated greater than 20 mmHg both by echocardiogram and diagnostic cardiac catheterization.   Left ventricular systolic function remains preserved although the patient does have significant left ventricular hypertrophy and diastolic dysfunction.  Recent right heart catheterization revealed moderate pulmonary hypertension and mildly elevated pulmonary capillary wedge pressure with relatively high cardiac output.  Right heart size and function function appear normal and there is trivial tricuspid regurgitation.  CT angiography confirmed the presence of a trileaflet aortic valve and severe leaflet pathology consistent with both severe aortic and mitral stenosis.  There is severe calcification  surrounding the mitral annulus.  The calcification does not extend close to the aortic annulus and the aortic-mitral curtain appears relatively spared of calcification.  Options include continued medical therapy with close observation versus proceeding with aortic and mitral valve replacement.  I do not feel that transcatheter aortic valve replacement with or without catheter-based approach to treatment of the patient's mitral valve disease would be a reasonable option.  The patient's mitral valve disease is quite severe and balloon mitral valvuloplasty does not appear to be an option and there are no commercially available transcatheter mitral valve devices currently available to treat native valve stenosis.  Risks associated with conventional surgery will unquestionably be high because of the patient's numerous comorbid medical problems, most notably including dialysis-dependent renal failure.  The severity of mitral valve and annular calcification may put her at relatively high risk for surgical complications including the development of paravalvular leak.  In addition, the patient has remote history of stroke and a history of atrial fibrillation has been mentioned on one occasion in the past, although the patient does not carry a diagnosis of recurrent paroxysmal atrial fibrillation and she is not currently on long-term anticoagulation.  Risk of stroke may be significant.    Plan:  The patient was again counseled at length regarding the treatment options for management of severe symptomatic aortic and mitral stenosis.  Continued medical therapy with  close follow-up is been contrasted with high risk double valve replacement.   Discussion was held comparing the relative risks of mechanical valve replacement with need for lifelong anticoagulation versus use of a bioprosthetic tissue valve and the associated potential for late structural valve deterioration and failure.  This discussion was placed in the  context of the patient's particular circumstances including dialysis-dependent renal failure.  All of her questions have been addressed.  We tentatively plan to proceed with surgery on December 02, 2018.  The patient will undergo routine hemodialysis on the morning of December 01, 2018 and return to our office that afternoon for follow-up prior to surgery.  Her mother will accompany her for her preoperative office visit on December 01, 2018.  All questions answered.   I spent in excess of 15 minutes during the conduct of this office consultation and >50% of this time involved direct face-to-face encounter with the patient for counseling and/or coordination of their care.    Valentina Gu. Roxy Manns, MD 11/04/2018 3:54 PM

## 2018-11-05 ENCOUNTER — Other Ambulatory Visit: Payer: Self-pay | Admitting: *Deleted

## 2018-11-05 ENCOUNTER — Encounter: Payer: Self-pay | Admitting: *Deleted

## 2018-11-05 DIAGNOSIS — D509 Iron deficiency anemia, unspecified: Secondary | ICD-10-CM | POA: Diagnosis not present

## 2018-11-05 DIAGNOSIS — M3210 Systemic lupus erythematosus, organ or system involvement unspecified: Secondary | ICD-10-CM | POA: Diagnosis not present

## 2018-11-05 DIAGNOSIS — N186 End stage renal disease: Secondary | ICD-10-CM | POA: Diagnosis not present

## 2018-11-05 DIAGNOSIS — I350 Nonrheumatic aortic (valve) stenosis: Secondary | ICD-10-CM

## 2018-11-05 DIAGNOSIS — N2581 Secondary hyperparathyroidism of renal origin: Secondary | ICD-10-CM | POA: Diagnosis not present

## 2018-11-05 DIAGNOSIS — D631 Anemia in chronic kidney disease: Secondary | ICD-10-CM | POA: Diagnosis not present

## 2018-11-05 DIAGNOSIS — I050 Rheumatic mitral stenosis: Secondary | ICD-10-CM

## 2018-11-06 ENCOUNTER — Ambulatory Visit (HOSPITAL_COMMUNITY): Payer: Medicare Other

## 2018-11-07 DIAGNOSIS — N186 End stage renal disease: Secondary | ICD-10-CM | POA: Diagnosis not present

## 2018-11-07 DIAGNOSIS — N2581 Secondary hyperparathyroidism of renal origin: Secondary | ICD-10-CM | POA: Diagnosis not present

## 2018-11-07 DIAGNOSIS — D631 Anemia in chronic kidney disease: Secondary | ICD-10-CM | POA: Diagnosis not present

## 2018-11-07 DIAGNOSIS — D509 Iron deficiency anemia, unspecified: Secondary | ICD-10-CM | POA: Diagnosis not present

## 2018-11-07 DIAGNOSIS — M3210 Systemic lupus erythematosus, organ or system involvement unspecified: Secondary | ICD-10-CM | POA: Diagnosis not present

## 2018-11-10 DIAGNOSIS — D631 Anemia in chronic kidney disease: Secondary | ICD-10-CM | POA: Diagnosis not present

## 2018-11-10 DIAGNOSIS — N2581 Secondary hyperparathyroidism of renal origin: Secondary | ICD-10-CM | POA: Diagnosis not present

## 2018-11-10 DIAGNOSIS — D509 Iron deficiency anemia, unspecified: Secondary | ICD-10-CM | POA: Diagnosis not present

## 2018-11-10 DIAGNOSIS — M3210 Systemic lupus erythematosus, organ or system involvement unspecified: Secondary | ICD-10-CM | POA: Diagnosis not present

## 2018-11-10 DIAGNOSIS — N186 End stage renal disease: Secondary | ICD-10-CM | POA: Diagnosis not present

## 2018-11-12 DIAGNOSIS — D631 Anemia in chronic kidney disease: Secondary | ICD-10-CM | POA: Diagnosis not present

## 2018-11-12 DIAGNOSIS — N186 End stage renal disease: Secondary | ICD-10-CM | POA: Diagnosis not present

## 2018-11-12 DIAGNOSIS — D509 Iron deficiency anemia, unspecified: Secondary | ICD-10-CM | POA: Diagnosis not present

## 2018-11-12 DIAGNOSIS — N2581 Secondary hyperparathyroidism of renal origin: Secondary | ICD-10-CM | POA: Diagnosis not present

## 2018-11-12 DIAGNOSIS — M3210 Systemic lupus erythematosus, organ or system involvement unspecified: Secondary | ICD-10-CM | POA: Diagnosis not present

## 2018-11-14 DIAGNOSIS — D509 Iron deficiency anemia, unspecified: Secondary | ICD-10-CM | POA: Diagnosis not present

## 2018-11-14 DIAGNOSIS — M3210 Systemic lupus erythematosus, organ or system involvement unspecified: Secondary | ICD-10-CM | POA: Diagnosis not present

## 2018-11-14 DIAGNOSIS — N186 End stage renal disease: Secondary | ICD-10-CM | POA: Diagnosis not present

## 2018-11-14 DIAGNOSIS — N2581 Secondary hyperparathyroidism of renal origin: Secondary | ICD-10-CM | POA: Diagnosis not present

## 2018-11-14 DIAGNOSIS — D631 Anemia in chronic kidney disease: Secondary | ICD-10-CM | POA: Diagnosis not present

## 2018-11-17 DIAGNOSIS — N186 End stage renal disease: Secondary | ICD-10-CM | POA: Diagnosis not present

## 2018-11-17 DIAGNOSIS — D509 Iron deficiency anemia, unspecified: Secondary | ICD-10-CM | POA: Diagnosis not present

## 2018-11-17 DIAGNOSIS — D631 Anemia in chronic kidney disease: Secondary | ICD-10-CM | POA: Diagnosis not present

## 2018-11-17 DIAGNOSIS — M3210 Systemic lupus erythematosus, organ or system involvement unspecified: Secondary | ICD-10-CM | POA: Diagnosis not present

## 2018-11-17 DIAGNOSIS — N2581 Secondary hyperparathyroidism of renal origin: Secondary | ICD-10-CM | POA: Diagnosis not present

## 2018-11-19 DIAGNOSIS — Z992 Dependence on renal dialysis: Secondary | ICD-10-CM | POA: Diagnosis not present

## 2018-11-19 DIAGNOSIS — D631 Anemia in chronic kidney disease: Secondary | ICD-10-CM | POA: Diagnosis not present

## 2018-11-19 DIAGNOSIS — D509 Iron deficiency anemia, unspecified: Secondary | ICD-10-CM | POA: Diagnosis not present

## 2018-11-19 DIAGNOSIS — I120 Hypertensive chronic kidney disease with stage 5 chronic kidney disease or end stage renal disease: Secondary | ICD-10-CM | POA: Diagnosis not present

## 2018-11-19 DIAGNOSIS — N186 End stage renal disease: Secondary | ICD-10-CM | POA: Diagnosis not present

## 2018-11-19 DIAGNOSIS — N2581 Secondary hyperparathyroidism of renal origin: Secondary | ICD-10-CM | POA: Diagnosis not present

## 2018-11-19 DIAGNOSIS — M3210 Systemic lupus erythematosus, organ or system involvement unspecified: Secondary | ICD-10-CM | POA: Diagnosis not present

## 2018-11-21 DIAGNOSIS — M3210 Systemic lupus erythematosus, organ or system involvement unspecified: Secondary | ICD-10-CM | POA: Diagnosis not present

## 2018-11-21 DIAGNOSIS — D631 Anemia in chronic kidney disease: Secondary | ICD-10-CM | POA: Diagnosis not present

## 2018-11-21 DIAGNOSIS — N2581 Secondary hyperparathyroidism of renal origin: Secondary | ICD-10-CM | POA: Diagnosis not present

## 2018-11-21 DIAGNOSIS — D509 Iron deficiency anemia, unspecified: Secondary | ICD-10-CM | POA: Diagnosis not present

## 2018-11-21 DIAGNOSIS — N186 End stage renal disease: Secondary | ICD-10-CM | POA: Diagnosis not present

## 2018-11-24 DIAGNOSIS — M3210 Systemic lupus erythematosus, organ or system involvement unspecified: Secondary | ICD-10-CM | POA: Diagnosis not present

## 2018-11-24 DIAGNOSIS — D631 Anemia in chronic kidney disease: Secondary | ICD-10-CM | POA: Diagnosis not present

## 2018-11-24 DIAGNOSIS — N186 End stage renal disease: Secondary | ICD-10-CM | POA: Diagnosis not present

## 2018-11-24 DIAGNOSIS — D509 Iron deficiency anemia, unspecified: Secondary | ICD-10-CM | POA: Diagnosis not present

## 2018-11-24 DIAGNOSIS — N2581 Secondary hyperparathyroidism of renal origin: Secondary | ICD-10-CM | POA: Diagnosis not present

## 2018-11-26 DIAGNOSIS — D631 Anemia in chronic kidney disease: Secondary | ICD-10-CM | POA: Diagnosis not present

## 2018-11-26 DIAGNOSIS — N2581 Secondary hyperparathyroidism of renal origin: Secondary | ICD-10-CM | POA: Diagnosis not present

## 2018-11-26 DIAGNOSIS — N186 End stage renal disease: Secondary | ICD-10-CM | POA: Diagnosis not present

## 2018-11-26 DIAGNOSIS — M3210 Systemic lupus erythematosus, organ or system involvement unspecified: Secondary | ICD-10-CM | POA: Diagnosis not present

## 2018-11-26 DIAGNOSIS — D509 Iron deficiency anemia, unspecified: Secondary | ICD-10-CM | POA: Diagnosis not present

## 2018-11-26 NOTE — Pre-Procedure Instructions (Signed)
[name]  11/26/2018      [facility] [place], [place] - [address] AT [address] [place] [address] Phone: [phone] Fax: [phone]    Your procedure is scheduled on July 14  Report to [facility] Entrance A at 5:30 A.M.  Call this number if you have problems the morning of surgery:  [phone]   Remember:  Do not eat or drink after midnight.      Take these medicines the morning of surgery with A SIP OF WATER :             Clonidine (catapres)            Labetalol (normodyne)            Ondansetron (zofran) if needed            oxycodone if needed            Pantoprazole (protonix)            Prednisone (deltasone)           7 days prior to surgery STOP taking  Aleve, Naproxen, Ibuprofen, Motrin, Advil, Goody's, BC's, all herbal medications, fish oil, and all vitamins.                Do not wear jewelry, make-up or nail polish.  Do not wear lotions, powders, or perfumes, or deodorant.  Do not shave 48 hours prior to surgery.  Men may shave face and neck.  Do not bring valuables to the hospital.  [facility] is not responsible for any belongings or valuables.  Contacts, dentures or bridgework may not be worn into surgery.  Leave your suitcase in the car.  After surgery it may be brought to your room.  For patients admitted to the hospital, discharge time will be determined by your treatment team.  Patients discharged the day of surgery will not be allowed to drive home.    Special instructions:   [facility]- Preparing For Surgery  Before surgery, you can play an important role. Because skin is not sterile, your skin needs to be as free of germs as possible. You can reduce the number of germs on your skin by washing with CHG (chlorahexidine gluconate) Soap before surgery.  CHG is an antiseptic cleaner which kills germs and bonds with the skin to continue killing germs even after washing.     Oral Hygiene is also important to reduce your risk of infection.  Remember - BRUSH YOUR TEETH THE MORNING OF SURGERY WITH YOUR REGULAR TOOTHPASTE  Please do not use if you have an allergy to CHG or antibacterial soaps. If your skin becomes reddened/irritated stop using the CHG.  Do not shave (including legs and underarms) for at least 48 hours prior to first CHG shower. It is OK to shave your face.  Please follow these instructions carefully.   1. Shower the NIGHT BEFORE SURGERY and the MORNING OF SURGERY with CHG.   2. If you chose to wash your hair, wash your hair first as usual with your normal shampoo.  3. After you shampoo, rinse your hair and body thoroughly to remove the shampoo.  4. Use CHG as you would any other liquid soap. You can apply CHG directly to the skin and wash gently with a scrungie or a clean washcloth.   5. Apply the CHG Soap to your body ONLY FROM THE  NECK DOWN.  Do not use on open wounds or open sores. Avoid contact with your eyes, ears, mouth and genitals (private parts). Wash Face and genitals (private parts)  with your normal soap.  6. Wash thoroughly, paying special attention to the area where your surgery will be performed.  7. Thoroughly rinse your body with warm water from the neck down.  8. DO NOT shower/wash with your normal soap after using and rinsing off the CHG Soap.  9. Pat yourself dry with a CLEAN TOWEL.  10. Wear CLEAN PAJAMAS to bed the night before surgery, wear comfortable clothes the morning of surgery  11. Place CLEAN SHEETS on your bed the night of your first shower and DO NOT SLEEP WITH PETS.    Day of Surgery:  Do not apply any deodorants/lotions.  Please wear clean clothes to the hospital/surgery center.   Remember to brush your teeth WITH YOUR REGULAR TOOTHPASTE.    Please read over the following fact sheets that you were given. Coughing and Deep Breathing, MRSA Information and Surgical Site Infection  Prevention

## 2018-11-27 ENCOUNTER — Encounter (HOSPITAL_COMMUNITY): Payer: Self-pay

## 2018-11-27 ENCOUNTER — Other Ambulatory Visit: Payer: Self-pay

## 2018-11-27 ENCOUNTER — Encounter (HOSPITAL_COMMUNITY)
Admission: RE | Admit: 2018-11-27 | Discharge: 2018-11-27 | Disposition: A | Discharge status: Home / Self Care | Payer: Medicare Other | Source: Ambulatory Visit | Attending: Thoracic Surgery (Cardiothoracic Vascular Surgery) | Admitting: Thoracic Surgery (Cardiothoracic Vascular Surgery)

## 2018-11-27 ENCOUNTER — Ambulatory Visit (HOSPITAL_COMMUNITY)
Admission: RE | Admit: 2018-11-27 | Discharge: 2018-11-27 | Disposition: A | Discharge status: Home / Self Care | Payer: Medicare Other | Source: Ambulatory Visit | Attending: Thoracic Surgery (Cardiothoracic Vascular Surgery) | Admitting: Thoracic Surgery (Cardiothoracic Vascular Surgery)

## 2018-11-27 ENCOUNTER — Other Ambulatory Visit (HOSPITAL_COMMUNITY): Payer: Medicare Other

## 2018-11-27 DIAGNOSIS — I342 Nonrheumatic mitral (valve) stenosis: Secondary | ICD-10-CM | POA: Diagnosis not present

## 2018-11-27 DIAGNOSIS — I050 Rheumatic mitral stenosis: Secondary | ICD-10-CM | POA: Diagnosis not present

## 2018-11-27 DIAGNOSIS — Z8673 Personal history of transient ischemic attack (TIA), and cerebral infarction without residual deficits: Secondary | ICD-10-CM | POA: Diagnosis not present

## 2018-11-27 DIAGNOSIS — Z79899 Other long term (current) drug therapy: Secondary | ICD-10-CM | POA: Insufficient documentation

## 2018-11-27 DIAGNOSIS — Z01818 Encounter for other preprocedural examination: Secondary | ICD-10-CM | POA: Diagnosis not present

## 2018-11-27 DIAGNOSIS — R0989 Other specified symptoms and signs involving the circulatory and respiratory systems: Secondary | ICD-10-CM | POA: Insufficient documentation

## 2018-11-27 DIAGNOSIS — I5032 Chronic diastolic (congestive) heart failure: Secondary | ICD-10-CM | POA: Diagnosis not present

## 2018-11-27 DIAGNOSIS — Z992 Dependence on renal dialysis: Secondary | ICD-10-CM | POA: Insufficient documentation

## 2018-11-27 DIAGNOSIS — I350 Nonrheumatic aortic (valve) stenosis: Secondary | ICD-10-CM | POA: Diagnosis not present

## 2018-11-27 DIAGNOSIS — D649 Anemia, unspecified: Secondary | ICD-10-CM | POA: Diagnosis not present

## 2018-11-27 DIAGNOSIS — Z87891 Personal history of nicotine dependence: Secondary | ICD-10-CM | POA: Diagnosis not present

## 2018-11-27 DIAGNOSIS — I132 Hypertensive heart and chronic kidney disease with heart failure and with stage 5 chronic kidney disease, or end stage renal disease: Secondary | ICD-10-CM | POA: Insufficient documentation

## 2018-11-27 DIAGNOSIS — R918 Other nonspecific abnormal finding of lung field: Secondary | ICD-10-CM | POA: Insufficient documentation

## 2018-11-27 DIAGNOSIS — Z7982 Long term (current) use of aspirin: Secondary | ICD-10-CM | POA: Insufficient documentation

## 2018-11-27 DIAGNOSIS — N186 End stage renal disease: Secondary | ICD-10-CM | POA: Diagnosis not present

## 2018-11-27 HISTORY — DX: Claustrophobia: F40.240

## 2018-11-27 LAB — COMPREHENSIVE METABOLIC PANEL
ALT: 17 U/L (ref 0–44)
AST: 21 U/L (ref 15–41)
Albumin: 3.2 g/dL — ABNORMAL LOW (ref 3.5–5.0)
Alkaline Phosphatase: 60 U/L (ref 38–126)
Anion gap: 21 — ABNORMAL HIGH (ref 5–15)
BUN: 59 mg/dL — ABNORMAL HIGH (ref 6–20)
CO2: 18 mmol/L — ABNORMAL LOW (ref 22–32)
Calcium: 8.9 mg/dL (ref 8.9–10.3)
Chloride: 94 mmol/L — ABNORMAL LOW (ref 98–111)
Creatinine, Ser: 7.15 mg/dL — ABNORMAL HIGH (ref 0.44–1.00)
GFR calc Af Amer: 7 mL/min — ABNORMAL LOW (ref >60)
GFR calc non Af Amer: 6 mL/min — ABNORMAL LOW (ref >60)
Glucose, Bld: 94 mg/dL (ref 70–99)
Potassium: 4.6 mmol/L (ref 3.5–5.1)
Sodium: 133 mmol/L — ABNORMAL LOW (ref 135–145)
Total Bilirubin: 0.3 mg/dL (ref 0.3–1.2)
Total Protein: 6.4 g/dL — ABNORMAL LOW (ref 6.5–8.1)

## 2018-11-27 LAB — CBC
HCT: 36.6 % (ref 36.0–46.0)
Hemoglobin: 11.5 g/dL — ABNORMAL LOW (ref 12.0–15.0)
MCH: 32.3 pg (ref 26.0–34.0)
MCHC: 31.4 g/dL (ref 30.0–36.0)
MCV: 102.8 fL — ABNORMAL HIGH (ref 80.0–100.0)
Platelets: 188 10*3/uL (ref 150–400)
RBC: 3.56 MIL/uL — ABNORMAL LOW (ref 3.87–5.11)
RDW: 18.6 % — ABNORMAL HIGH (ref 11.5–15.5)
WBC: 5.7 10*3/uL (ref 4.0–10.5)
nRBC: 0 % (ref 0.0–0.2)

## 2018-11-27 LAB — BLOOD GAS, ARTERIAL
Acid-Base Excess: 3.1 mmol/L — ABNORMAL HIGH (ref 0.0–2.0)
Bicarbonate: 26.5 mmol/L (ref 20.0–28.0)
Drawn by: 470591
FIO2: 0.21
O2 Saturation: 99.3 %
Patient temperature: 98.6
pCO2 arterial: 36.2 mmHg (ref 32.0–48.0)
pH, Arterial: 7.478 — ABNORMAL HIGH (ref 7.350–7.450)
pO2, Arterial: 154 mmHg — ABNORMAL HIGH (ref 83.0–108.0)

## 2018-11-27 LAB — TYPE AND SCREEN
ABO/RH(D): A NEG
Antibody Screen: NEGATIVE

## 2018-11-27 LAB — APTT: aPTT: 31 seconds (ref 24–36)

## 2018-11-27 LAB — PROTIME-INR
INR: 1 (ref 0.8–1.2)
Prothrombin Time: 13.2 seconds (ref 11.4–15.2)

## 2018-11-27 LAB — SURGICAL PCR SCREEN
MRSA, PCR: NEGATIVE
Staphylococcus aureus: NEGATIVE

## 2018-11-27 NOTE — Progress Notes (Signed)
Pt denies SOB and chest pain. Pt stated that she is under the care of Dr. Dorris Carnes, Cardiology and Dr. Benito Mccreedy, PCP. Pt denies having a chest x ray within the last 2 weeks and an EKG within the last month. Pt denies recent labs in the last week.  Pt denies that she and family members tested positive for COVID-19 ( pt stated that she is scheduled to be tested on 11/28/18 and reminded to quarantine).   Pt denies that she and family members experienced the following symptoms:  Cough yes/no: No Fever (>100.5F)  yes/no: No Runny nose yes/no: No Sore throat yes/no: No Difficulty breathing/shortness of breath  yes/no: No  Have you or a family member traveled in the last 14 days and where? yes/no: No  Pt verbalized understanding of all pre-op instructions.  Pt chart forwarded to PA, Anesthesiology, for review.

## 2018-11-27 NOTE — Progress Notes (Signed)
Pre-AVR testing has been completed. Preliminary results can be found in CV Proc through chart review.   11/27/18 10:37 AM [name] RVT

## 2018-11-28 ENCOUNTER — Other Ambulatory Visit (HOSPITAL_COMMUNITY)
Admission: RE | Admit: 2018-11-28 | Discharge: 2018-11-28 | Disposition: A | Discharge status: Home / Self Care | Payer: Medicare Other | Source: Ambulatory Visit | Attending: Thoracic Surgery (Cardiothoracic Vascular Surgery) | Admitting: Thoracic Surgery (Cardiothoracic Vascular Surgery)

## 2018-11-28 DIAGNOSIS — Z1159 Encounter for screening for other viral diseases: Secondary | ICD-10-CM | POA: Insufficient documentation

## 2018-11-28 DIAGNOSIS — N2581 Secondary hyperparathyroidism of renal origin: Secondary | ICD-10-CM | POA: Diagnosis not present

## 2018-11-28 DIAGNOSIS — N186 End stage renal disease: Secondary | ICD-10-CM | POA: Diagnosis not present

## 2018-11-28 DIAGNOSIS — Z01812 Encounter for preprocedural laboratory examination: Secondary | ICD-10-CM | POA: Insufficient documentation

## 2018-11-28 DIAGNOSIS — D509 Iron deficiency anemia, unspecified: Secondary | ICD-10-CM | POA: Diagnosis not present

## 2018-11-28 DIAGNOSIS — M3210 Systemic lupus erythematosus, organ or system involvement unspecified: Secondary | ICD-10-CM | POA: Diagnosis not present

## 2018-11-28 DIAGNOSIS — D631 Anemia in chronic kidney disease: Secondary | ICD-10-CM | POA: Diagnosis not present

## 2018-11-28 LAB — HEMOGLOBIN A1C
Hgb A1c MFr Bld: 4.9 % (ref 4.8–5.6)
Mean Plasma Glucose: 94 mg/dL

## 2018-11-29 LAB — SARS CORONAVIRUS 2 (TAT 6-24 HRS): SARS Coronavirus 2: NEGATIVE

## 2018-12-01 ENCOUNTER — Other Ambulatory Visit: Payer: Self-pay

## 2018-12-01 ENCOUNTER — Encounter: Payer: Self-pay | Admitting: Thoracic Surgery (Cardiothoracic Vascular Surgery)

## 2018-12-01 ENCOUNTER — Encounter (HOSPITAL_COMMUNITY): Payer: Self-pay | Admitting: Certified Registered Nurse Anesthetist

## 2018-12-01 ENCOUNTER — Ambulatory Visit (HOSPITAL_COMMUNITY)
Admission: RE | Admit: 2018-12-01 | Discharge: 2018-12-01 | Disposition: A | Discharge status: Home / Self Care | Payer: Medicare Other | Source: Ambulatory Visit | Attending: Thoracic Surgery (Cardiothoracic Vascular Surgery) | Admitting: Thoracic Surgery (Cardiothoracic Vascular Surgery)

## 2018-12-01 ENCOUNTER — Ambulatory Visit (INDEPENDENT_AMBULATORY_CARE_PROVIDER_SITE_OTHER): Payer: Medicare Other | Admitting: Thoracic Surgery (Cardiothoracic Vascular Surgery)

## 2018-12-01 VITALS — BP 157/84 | HR 76 | Temp 97.7°F | Resp 20 | Ht 66.0 in | Wt 139.0 lb

## 2018-12-01 DIAGNOSIS — M3210 Systemic lupus erythematosus, organ or system involvement unspecified: Secondary | ICD-10-CM | POA: Diagnosis not present

## 2018-12-01 DIAGNOSIS — N186 End stage renal disease: Secondary | ICD-10-CM | POA: Diagnosis not present

## 2018-12-01 DIAGNOSIS — I050 Rheumatic mitral stenosis: Secondary | ICD-10-CM

## 2018-12-01 DIAGNOSIS — I350 Nonrheumatic aortic (valve) stenosis: Secondary | ICD-10-CM | POA: Insufficient documentation

## 2018-12-01 DIAGNOSIS — D509 Iron deficiency anemia, unspecified: Secondary | ICD-10-CM | POA: Diagnosis not present

## 2018-12-01 DIAGNOSIS — Z992 Dependence on renal dialysis: Secondary | ICD-10-CM | POA: Diagnosis not present

## 2018-12-01 DIAGNOSIS — I5032 Chronic diastolic (congestive) heart failure: Secondary | ICD-10-CM | POA: Diagnosis not present

## 2018-12-01 DIAGNOSIS — N2581 Secondary hyperparathyroidism of renal origin: Secondary | ICD-10-CM | POA: Diagnosis not present

## 2018-12-01 DIAGNOSIS — D631 Anemia in chronic kidney disease: Secondary | ICD-10-CM | POA: Diagnosis not present

## 2018-12-01 LAB — PULMONARY FUNCTION TEST
FEF 25-75 Pre: 3.27 L/sec
FEF2575-%Pred-Pre: 137 %
FEV1-%Pred-Pre: 94 %
FEV1-Pre: 2.25 L
FEV1FVC-%Pred-Pre: 105 %
FEV6-%Pred-Pre: 89 %
FEV6-Pre: 2.61 L
FEV6FVC-%Pred-Pre: 103 %
FVC-%Pred-Pre: 88 %
FVC-Pre: 2.65 L
Pre FEV1/FVC ratio: 85 %
Pre FEV6/FVC Ratio: 100 %

## 2018-12-01 MED ORDER — NITROGLYCERIN IN D5W 200-5 MCG/ML-% IV SOLN
2.0000 ug/min | INTRAVENOUS | Status: AC
  Administered 2018-12-02: 09:00:00 10 ug/min via INTRAVENOUS
  Filled 2018-12-01: qty 250

## 2018-12-01 MED ORDER — INSULIN REGULAR(HUMAN) IN NACL 100-0.9 UT/100ML-% IV SOLN
INTRAVENOUS | Status: AC
  Administered 2018-12-02: 08:00:00 1 [IU]/h via INTRAVENOUS
  Filled 2018-12-01: qty 100

## 2018-12-01 MED ORDER — PLASMA-LYTE 148 IV SOLN
INTRAVENOUS | Status: DC
  Filled 2018-12-01: qty 2.5

## 2018-12-01 MED ORDER — TRANEXAMIC ACID (OHS) BOLUS VIA INFUSION
15.0000 mg/kg | INTRAVENOUS | Status: AC
  Administered 2018-12-02: 949.5 mg via INTRAVENOUS
  Filled 2018-12-01: qty 950

## 2018-12-01 MED ORDER — VANCOMYCIN HCL 10 G IV SOLR
1250.0000 mg | INTRAVENOUS | Status: AC
  Administered 2018-12-02: 1250 mg via INTRAVENOUS
  Filled 2018-12-01: qty 1250

## 2018-12-01 MED ORDER — DOPAMINE-DEXTROSE 3.2-5 MG/ML-% IV SOLN
0.0000 ug/kg/min | INTRAVENOUS | Status: DC
  Filled 2018-12-01: qty 250

## 2018-12-01 MED ORDER — MANNITOL 20 % IV SOLN
INTRAVENOUS | Status: DC
  Filled 2018-12-01: qty 13

## 2018-12-01 MED ORDER — SODIUM CHLORIDE 0.9 % IV SOLN
750.0000 mg | INTRAVENOUS | Status: DC
  Filled 2018-12-01: qty 750

## 2018-12-01 MED ORDER — TRANEXAMIC ACID 1000 MG/10ML IV SOLN
1.5000 mg/kg/h | INTRAVENOUS | Status: AC
  Administered 2018-12-02: 09:00:00 1.5 mg/kg/h via INTRAVENOUS
  Filled 2018-12-01: qty 25

## 2018-12-01 MED ORDER — PHENYLEPHRINE HCL-NACL 20-0.9 MG/250ML-% IV SOLN
30.0000 ug/min | INTRAVENOUS | Status: AC
  Administered 2018-12-02: 12:00:00 15 ug/min via INTRAVENOUS
  Filled 2018-12-01: qty 250

## 2018-12-01 MED ORDER — POTASSIUM CHLORIDE 2 MEQ/ML IV SOLN
80.0000 meq | INTRAVENOUS | Status: DC
  Filled 2018-12-01: qty 40

## 2018-12-01 MED ORDER — DEXMEDETOMIDINE HCL IN NACL 400 MCG/100ML IV SOLN
0.1000 ug/kg/h | INTRAVENOUS | Status: AC
  Administered 2018-12-02: 08:00:00 .3 ug/kg/h via INTRAVENOUS
  Filled 2018-12-01: qty 100

## 2018-12-01 MED ORDER — VANCOMYCIN HCL 1000 MG IV SOLR
INTRAVENOUS | Status: DC
  Filled 2018-12-01: qty 1000

## 2018-12-01 MED ORDER — SODIUM CHLORIDE 0.9 % IV SOLN
1.5000 g | INTRAVENOUS | Status: AC
  Administered 2018-12-02: 13:00:00 .75 g via INTRAVENOUS
  Administered 2018-12-02: 1.5 g via INTRAVENOUS
  Filled 2018-12-01: qty 1.5

## 2018-12-01 MED ORDER — EPINEPHRINE PF 1 MG/ML IJ SOLN
0.0000 ug/min | INTRAVENOUS | Status: DC
  Filled 2018-12-01: qty 4

## 2018-12-01 MED ORDER — TRANEXAMIC ACID (OHS) PUMP PRIME SOLUTION
2.0000 mg/kg | INTRAVENOUS | Status: DC
  Filled 2018-12-01: qty 1.27

## 2018-12-01 MED ORDER — SODIUM CHLORIDE 0.9 % IV SOLN
INTRAVENOUS | Status: DC
  Filled 2018-12-01: qty 30

## 2018-12-01 MED ORDER — MILRINONE LACTATE IN DEXTROSE 20-5 MG/100ML-% IV SOLN
0.3000 ug/kg/min | INTRAVENOUS | Status: DC
  Filled 2018-12-01: qty 100

## 2018-12-01 NOTE — Progress Notes (Signed)
[address]       [place] [address]             [phone]     CARDIOTHORACIC SURGERY OFFICE NOTE  Referring Provider is Fay Records, MD  Primary Nephrologist is [name], [name], MD PCP is Benito Mccreedy, MD   HPI:  Patient is a 58 year old African-American female with history of aortic stenosis, mitral stenosis, end-stage renal disease on hemodialysis, hypertension, lupus, remote history of stroke, dysfunctional uterine bleeding, peptic ulcer disease, and chronic anemia who returns to the office today to further discuss possible surgical treatment of severe symptomatic aortic stenosis and mitral stenosis.  She was originally seen in consultation on Sep 23, 2018.  At that time the patient wished to delay any further intervention for at least a few weeks because of the ongoing COVID-19 pandemic.  She was last seen in the office on November 04, 2018 at which time we made tentative plans to proceed with surgery December 02, 2018.  She returns to the office today for follow-up prior to surgery.  She underwent hemodialysis this morning without any complications and subsequently completed all of her routine preoperative testing at the hospital.  She states that over the last few weeks she has not had any new problems or issues.  She denies any recent acute exacerbations of shortness of breath.  She has not had any fevers or productive cough.  She denies any exposures to persons with known or suspected COVID-19 infection.   Current Outpatient Medications  Medication Sig Dispense Refill  . amLODipine (NORVASC) 10 MG tablet Take 1 tablet (10 mg total) by mouth at bedtime. 30 tablet 4  . aspirin EC 81 MG tablet Take 81 mg by mouth at bedtime.    . calcitRIOL (ROCALTROL) 0.25 MCG capsule Take 7 capsules (1.75 mcg total) by mouth every other day. (Patient taking differently: Take 1.75 mcg by mouth every Monday, Wednesday, and Friday. At hemodyalisis) 14 capsule 0  . cloNIDine  (CATAPRES) 0.2 MG tablet Take 1 tablet (0.2 mg) by mouth twice daily - on dialysis days take after dialysis and at bedtime, on other days take one in the morning and one at night 180 tablet 3  . ferric citrate (AURYXIA) 1 GM 210 MG(Fe) tablet Take 840 mg by mouth 3 (three) times daily with meals.     . hydrALAZINE (APRESOLINE) 25 MG tablet Take 1 tablet (25 mg total) by mouth 3 (three) times daily. (Patient not taking: Reported on 10/30/2018) 270 tablet 3  . ibuprofen (ADVIL) 200 MG tablet Take 400 mg by mouth every 6 (six) hours as needed for moderate pain.    [name] labetalol (NORMODYNE) 300 MG tablet Take 1 tablet (300 mg total) by mouth 2 (two) times daily. 90 tablet 3  . lanthanum (FOSRENOL) 1000 MG chewable tablet Chew 2 tablets (2,000 mg total) by mouth 3 (three) times daily with meals. (Patient not taking: Reported on 11/19/2018) 180 tablet 0  . multivitamin (RENA-VIT) TABS tablet Take 1 tablet by mouth at bedtime.     . ondansetron (ZOFRAN) 4 MG tablet Take 4 mg by mouth every 6 (six) hours as needed for nausea or vomiting.    [name] oxyCODONE-acetaminophen (PERCOCET) 7.5-325 MG tablet Take 1 tablet by mouth every 6 (six) hours as needed for severe pain. 15 tablet 0  . pantoprazole (PROTONIX) 40 MG tablet Take 40 mg by mouth 2 (two) times daily.     . predniSONE (DELTASONE)  10 MG tablet Take 10 mg by mouth daily with breakfast.     No current facility-administered medications for this visit.    Facility-Administered Medications Ordered in Other Visits  Medication Dose Route Frequency Provider Last Rate Last Dose  . [START ON 12/02/2018] cefUROXime (ZINACEF) 1.5 g in sodium chloride 0.9 % 100 mL IVPB  1.5 g Intravenous To OR Rexene Alberts, MD      . Derrill Memo ON 12/02/2018] cefUROXime (ZINACEF) 750 mg in sodium chloride 0.9 % 100 mL IVPB  750 mg Intravenous To OR Rexene Alberts, MD      . Derrill Memo ON 12/02/2018] dexmedetomidine (PRECEDEX) 400 MCG/100ML (4 mcg/mL) infusion  0.1-0.7 mcg/kg/hr Intravenous To  OR Rexene Alberts, MD      . Derrill Memo ON 12/02/2018] DOPamine (INTROPIN) 800 mg in dextrose 5 % 250 mL (3.2 mg/mL) infusion  0-10 mcg/kg/min Intravenous To OR Rexene Alberts, MD      . Derrill Memo ON 12/02/2018] EPINEPHrine (ADRENALIN) 4 mg in dextrose 5 % 250 mL (0.016 mg/mL) infusion  0-10 mcg/min Intravenous To OR Rexene Alberts, MD      . Derrill Memo ON 12/02/2018] heparin 2,500 Units, papaverine 30 mg in electrolyte-148 (PLASMALYTE-148) 500 mL irrigation   Irrigation To OR Rexene Alberts, MD      . Derrill Memo ON 12/02/2018] heparin 30,000 units/NS 1000 mL solution for CELLSAVER   Other To OR Rexene Alberts, MD      . Derrill Memo ON 12/02/2018] insulin regular, human (MYXREDLIN) 100 units/ 100 mL infusion   Intravenous To OR Rexene Alberts, MD      . Derrill Memo ON 12/02/2018] [facility] Blood Cardioplegia vial (lidocaine/magnesium/mannitol 0.26g-4g-6.4g)   Intracoronary To OR Rexene Alberts, MD      . Derrill Memo ON 12/02/2018] milrinone (PRIMACOR) 20 MG/100 ML (0.2 mg/mL) infusion  0.3 mcg/kg/min Intravenous To OR Rexene Alberts, MD      . Derrill Memo ON 12/02/2018] nitroGLYCERIN 50 mg in dextrose 5 % 250 mL (0.2 mg/mL) infusion  2-200 mcg/min Intravenous To OR Rexene Alberts, MD      . Derrill Memo ON 12/02/2018] phenylephrine (NEOSYNEPHRINE) 20-0.9 MG/250ML-% infusion  30-200 mcg/min Intravenous To OR Rexene Alberts, MD      . Derrill Memo ON 12/02/2018] potassium chloride injection 80 mEq  80 mEq Other To OR Rexene Alberts, MD      . Derrill Memo ON 12/02/2018] tranexamic acid (CYKLOKAPRON) 2,500 mg in sodium chloride 0.9 % 250 mL (10 mg/mL) infusion  1.5 mg/kg/hr Intravenous To OR Rexene Alberts, MD      . Derrill Memo ON 12/02/2018] tranexamic acid (CYKLOKAPRON) bolus via infusion - over 30 minutes 949.5 mg  15 mg/kg Intravenous To OR Rexene Alberts, MD      . Derrill Memo ON 12/02/2018] tranexamic acid (CYKLOKAPRON) pump prime solution 127 mg  2 mg/kg Intracatheter To OR Rexene Alberts, MD      . Derrill Memo ON 12/02/2018] vancomycin (VANCOCIN)  1,000 mg in sodium chloride 0.9 % 1,000 mL irrigation   Irrigation To OR Rexene Alberts, MD      . Derrill Memo ON 12/02/2018] vancomycin (VANCOCIN) 1,250 mg in sodium chloride 0.9 % 250 mL IVPB  1,250 mg Intravenous To OR Rexene Alberts, MD          Physical Exam:   BP (!) 157/84   Pulse 76   Temp 97.7 F (36.5 C) (Skin)   Resp 20   Ht 5\' 6"  (1.676 m)   Wt 139  lb (63 kg)   LMP 12/05/2010 (LMP Unknown)   SpO2 96% Comment: RA  BMI 22.44 kg/m   General:  Well-appearing  Chest:   Clear to auscultation  CV:   Regular rate and rhythm with systolic murmur  Incisions:  n/a  Abdomen:  Soft nontender  Extremities:  Warm and well-perfused, no edema  Diagnostic Tests:  CHEST - 2 VIEW  COMPARISON:  Chest x-ray 03/06/2018.  FINDINGS: Mediastinum hilar structures normal. Cardiomegaly. Mild pulmonary venous congestion and basilar interstitial prominence. Mild CHF cannot be excluded. No pleural effusion or pneumothorax. Multiple calcifications again noted the spleen.  IMPRESSION: Cardiomegaly with pulmonary venous congestion and bilateral mild pulmonary interstitial prominence suggesting mild CHF.   Electronically Signed   By: Marcello Moores  Register   On: 11/27/2018 16:31   Impression:  Patient has stage D severe symptomatic aortic and mitral stenosis.  She describe stable symptoms of exertional shortness of breath and fatigue consistent with chronic diastolic congestive heart failure, New York Heart Association functional class IIb-III.  I have personally reviewed the patient's most recent transthoracic echocardiogram, diagnostic cardiac catheterization and CT angiograms as well as the transesophageal echocardiogram performed September 2019. She has at least moderate aortic stenosis and severe mitral stenosis. On recent transthoracic echocardiogram peak velocity across aortic valve measured greater than 4.0 m/s corresponding to mean transvalvular gradient estimated 33 mmHg. There was  moderate thickening and restricted leaflet mobility. The DVI was relatively high at 0.37 suggesting relatively high flow. The transesophageal echocardiogram performed last fall revealed severe calcification surrounding the mitral annulus with severely restricted leaflet mobility. There is severe mitral stenosis and mild to moderate mitral regurgitation. Transvalvular gradients across the mitral valve have been estimated greater than 20 mmHg both by echocardiogram and diagnostic cardiac catheterization. Left ventricular systolic function remains preserved although the patient does have significant left ventricular hypertrophy and diastolicdysfunction.Recent right heart catheterization revealed moderate pulmonary hypertension and mildly elevated pulmonary capillary wedge pressure with relatively high cardiac output. Right heart size and function function appear normal and there is trivial tricuspid regurgitation.  CT angiography confirmed the presence of a trileaflet aortic valve and severe leaflet pathology consistent with both severe aortic and mitral stenosis.  There is severe calcification surrounding the mitral annulus.  The calcification does not extend close to the aortic annulus and the aortic-mitral curtain appears relatively spared of calcification.  Options include continued medical therapy with close observation versus proceeding with aortic and mitral valve replacement. I do not feel that transcatheter aortic valve replacement with or without catheter-based approach to treatment of the patient's mitral valve disease would be a reasonable option. The patient's mitral valve disease is quite severe and balloon mitral valvuloplasty does not appear to be an option and there are no commercially available transcatheter mitral valve devices currently availableto treat native valve stenosis.Risks associated with conventional surgery will unquestionably be high because of the patient's numerous  comorbid medical problems, most notablyincludingdialysis-dependent renal failure. The severity ofmitralvalve andannular calcification may put her at relatively high risk for surgical complications including the development of paravalvular leak. In addition, the patient has remote history of stroke and a history of atrial fibrillation has been mentioned on one occasion in the past, although the patient does not carry a diagnosis of recurrent paroxysmal atrial fibrillation and she is not currently on long-term anticoagulation. Risk of stroke may be significant.   Plan:  The patient and her mother were again counseled at length regarding the treatment options for management of severe symptomatic  aortic and mitral stenosis.  Continued medical therapy with close follow-up is been contrasted with high risk double valve replacement.  Discussion was held comparing the relative risks of mechanical valve replacement with need for lifelong anticoagulation versus use of a bioprosthetic tissue valve and the associated potential for late structural valve deterioration and failure. This discussion was placed in the context of the patient's particular circumstances including dialysis-dependent renal failure.  The patient specifically requests that her valve be replaced using mechanical prosthetic valves.  She understands that this will require lifelong anticoagulation using warfarin.  They understand and accept all potential risks of surgery including but not limited to risk of death, stroke or other neurologic complication, myocardial infarction, congestive heart failure, respiratory failure, renal failure, bleeding requiring transfusion and/or reexploration, heart block or bradycardia requiring permanent pacemaker implantation, arrhythmia, infection or other wound complications, pneumonia, pleural and/or pericardial effusion, pulmonary embolus, aortic dissection or other major vascular complication, or delayed  complications related to valve repair or replacement including but not limited to structural valve deterioration and failure, thrombosis, embolization, endocarditis, or paravalvular leak.  Expectations for her postoperative convalescence have been discussed.  All of their questions have been answered.    I spent in excess of 15 minutes during the conduct of this office consultation and >50% of this time involved direct face-to-face encounter with the patient for counseling and/or coordination of their care.    Valentina Gu. Roxy Manns, MD 12/01/2018 3:34 PM

## 2018-12-01 NOTE — Patient Instructions (Addendum)
   Have nothing to eat or drink after midnight the night before surgery.  On the morning of surgery take only Protonix, Labetalol and clonidine with a sip of water.

## 2018-12-01 NOTE — H&P (Signed)
[address]       [place] [address]             [phone]          CARDIOTHORACIC SURGERY HISTORY AND PHYSICAL EXAM  Referring Provider is Fay Records, MD  Primary Nephrologist is [name], [name], MD PCP is Benito Mccreedy, MD  Chief Complaint  Patient presents with   Aortic Stenosis    Surgical eval, ECHO 07/03/18, Cardiac Cath 07/24/18   Mitral Stenosis    HPI:  Patient is a 58 year old African-American female with history of aortic stenosis, mitral stenosis, end-stage renal disease on hemodialysis, hypertension, lupus, remote history of stroke, dysfunctional uterine bleeding, peptic ulcer disease, and chronic anemia who has been referred for surgical consultation to discuss treatment options for management of aortic stenosis and mitral stenosis.  Patient's cardiac history dates back to 2016 when she was hospitalized with acute hypoxic respiratory failure and flash pulmonary edema in the setting of influenza A pneumonia and hypertensive urgency.  She was treated with dialysis and Tamiflu and recovered.  Cardiac enzymes were elevated at that time.  Echocardiogram revealed normal left ventricular function with moderate mitral stenosis and mild aortic stenosis.  She has been followed intermittently ever since by Dr. Harrington Challenger.  She has had numerous hospitalizations over the last several years for a variety of problems.  June 2019 she was hospitalized with respiratory distress and volume overload.  Apparently she was in rapid atrial fibrillation for a brief period of time.  Transesophageal echocardiogram performed January 02, 2018 revealed normal left ventricular systolic function with moderate aortic stenosis.  Findings were suggestive of possible bicuspid aortic valve.  There was felt to be "at least moderate mitral stenosis".  Mean transvalvular gradient across the mitral valve was estimated 16 mmHg with mitral valve area by pressure half-time calculated 2.8  cm.  By planimetry the valve area measured 1.5 cm.  There was mild to moderate mitral regurgitation and heavy calcification of the mitral valve with severely restricted leaflet mobility.  She was treated medically and seen most recently in follow-up by Dr. Harrington Challenger on July 03, 2018.  Follow-up echocardiogram performed at that time revealed increased transvalvular gradients across both the mitral and aortic valves and findings suggestive of high filling pressures.  Peak transvalvular gradient across the aortic valve measured 4.1 m/s corresponding to mean transvalvular gradient estimated 33 mmHg and aortic valve area calculated 1.1 cm.  The DVI was relatively high and reported 0.37.  Mean transvalvular gradient across the mitral valve was reported 21.7 mmHg but the mitral valve area calculated based on pressure half-time was reported 3.91 cm.  Left ventricular systolic function remain normal with ejection fraction estimated 60 to 65%.  The patient subsequently underwent left and right heart catheterization on July 24, 2018.  She is found to have normal coronary artery anatomy with no significant coronary artery disease.  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  There was mildly elevated pulmonary capillary wedge pressure (19 mmHg) and high normal cardiac output (6.15 L/min).  There was severe mitral stenosis with mean transvalvular gradient estimated 22 mmHg.  There was moderate aortic stenosis with mean transvalvular gradients measured 10 mmHg using simultaneous waveforms from the femoral artery to the left ventricle and 24 mmHg on pullback gradient.  Cardiothoracic surgical consultation was requested.  Patient is single and lives with 1 of her adult daughters locally in [place].  She has been disabled for approximately 12 years because of her underlying chronic kidney disease.  She remains functionally independent.  She drives an  automobile and takes care of ordinary day-to-day activities around the house.  She dialyzes on a Monday Wednesday Friday schedule at the [facility] dialysis center via right upper arm AV fistula.  She has not had problems with dialysis treatments recently.  She describes stable symptoms of exertional shortness of breath and decreased energy.  She states that her primary problem is that of low energy.  She does get short of breath if she goes up a flight of stairs.  She denies resting shortness of breath, PND, orthopnea, or lower extremity edema.  She has never had any chest pain or chest tightness.  She reports occasional palpitation without any history of significant dizzy spells or syncope.  Patient is a 58 year old African-American female with history of aortic stenosis, mitral stenosis, end-stage renal disease on hemodialysis, hypertension, lupus, remote history of stroke, dysfunctional uterine bleeding, peptic ulcer disease, and chronic anemia whoreturns to the office today to further discuss possible surgical treatment of severe symptomatic aortic stenosis and mitral stenosis. She was originally seen in consultation on Sep 23, 2018. At that time the patient wished to delay any further intervention for at least a few weeks because of the ongoing COVID-19 pandemic.  She was last seen in the office on November 04, 2018 at which time we made tentative plans to proceed with surgery December 02, 2018.  She returns to the office today for follow-up prior to surgery.  She underwent hemodialysis this morning without any complications and subsequently completed all of her routine preoperative testing at the hospital.  She states that over the last few weeks she has not had any new problems or issues.  She denies any recent acute exacerbations of shortness of breath.  She has not had any fevers or productive cough.  She denies any exposures to persons with known or suspected COVID-19 infection.   Past Medical History:    Diagnosis Date   Anemia    Aortic stenosis 09/25/2016   Echo 07/24/16: Mod conc LVH, EF 60-65, no RWMA, Gr 2 DDd, bicuspid aortic valve, mild to mod AS (mean 18, peak 38), MAC, mod mitral stenosis (mean 9, peak 19), mild to mod MR, severe LAE, normal RVSF, mild RAE, mild TR   Arthritis    "joints" (10/23/2017)   Blood transfusion '08   [facility]; "low HgB" (10/23/2017)   Chronic diastolic (congestive) heart failure (HCC)    Chronic diastolic CHF (congestive heart failure) ([place])    Claustrophobia    Dysfunctional uterine bleeding 12/19/2010   ESRD (end stage renal disease) on dialysis ([place])    "MWF; [name]." (10/23/2017)   GERD (gastroesophageal reflux disease)    Headache    Heart murmur    Hemodialysis patient ()    right extremity port   History of hiatal hernia    Hx of cardiovascular stress test    Lexiscan Myoview 4/16:  Normal stress nuclear study, EF 59%   Hypertension    Lupus ([place])    "? kind" (10/23/2017)   Mitral stenosis    Echo 4/16:  EF 55-60%, no RWMA, Gr 1 DD, mod MS (mean 9 mmHg), mod LAE, mild RAE, PASP 65, mod to severe TR, trivial eff // Echo 6/19:  Mild LVH, EF 55-60, no RWMA, Gr 2 DD, mild to mod AS (Mean 20), severe MS (mean 17), massive LAE, PASP 48,  trivial effusion    Peptic ulcer disease    Pneumonia 10/21/2017   PONV (postoperative nausea and vomiting)    Stroke [facility])    per patient "they said i had a small stroke but i couldnt even tell"    Past Surgical History:  Procedure Laterality Date   AV FISTULA PLACEMENT Right 02/25/2018   Procedure: INSERTION OF 48m x 16cm ARTEGRAFT;  Surgeon: CWaynetta Sandy MD;  [place] [facility]  Service: Vascular;  Laterality: Right;   COLONOSCOPY W/ BIOPSIES AND POLYPECTOMY     DIALYSIS FISTULA CREATION  2007   ENDOMETRIAL ABLATION     ESOPHAGOGASTRODUODENOSCOPY N/A 07/31/2014   Procedure: ESOPHAGOGASTRODUODENOSCOPY (EGD);  Surgeon: MClarene Essex MD;  [facility]  Service:  Endoscopy;  Laterality: N/A;   FISTULOGRAM Right 02/25/2018   Procedure: FISTULOGRAM ARM;  Surgeon: CWaynetta Sandy MD;  [facility]  Service: Vascular;  Laterality: Right;   HEMATOMA EVACUATION Right 03/06/2018   Procedure: EVACUATION HEMATOMA RIGHT UPPER ARM;  Surgeon: CWaynetta Sandy MD;  [facility]  Service: Vascular;  Laterality: Right;   INSERTION OF ARTERIOVENOUS (AV) ARTEGRAFT ARM Right 09/26/2017   Procedure: INSERTION OF ARTERIOVENOUS (AV) ARTEGRAFT INTO RIGHT ARM;  Surgeon: CWaynetta Sandy MD;  [place] [facility]  Service: Vascular;  Laterality: Right;   INSERTION OF DIALYSIS CATHETER Right 03/06/2018   Procedure: INSERTION OF DIALYSIS CATHETER, right internal jugular;  Surgeon: CWaynetta Sandy MD;  [place] [facility]  Service: Vascular;  Laterality: Right;   REVISON OF ARTERIOVENOUS FISTULA Right 09/26/2017   Procedure: REVISION OF ARTERIOVENOUS FISTULA RIGHT ARM WITH ARTEGRAFT;  Surgeon: CWaynetta Sandy MD;  [place] [facility]  Service: Vascular;  Laterality: Right;   REVISON OF ARTERIOVENOUS FISTULA Right 02/25/2018   Procedure: REVISION OF ARTERIOVENOUS FISTULA;  Surgeon: CWaynetta Sandy MD;  [place]  Service: Vascular;  Laterality: Right;   RIGHT/LEFT HEART CATH AND CORONARY ANGIOGRAPHY N/A 07/24/2018   Procedure: RIGHT/LEFT HEART CATH AND CORONARY ANGIOGRAPHY;  Surgeon: MLarey Dresser MD;  [facility];  Service: Cardiovascular;  Laterality: N/A;   SHOULDER OPEN ROTATOR CUFF REPAIR Right 10/09/2016   Procedure: ROTATOR CUFF REPAIR SHOULDER OPEN partial acrominectomy and extensive synovectomy;  Surgeon: GLatanya [name] MD;  [facility];  Service: Orthopedics;  Laterality: Right;  RNFA   TEE WITHOUT CARDIOVERSION N/A 01/02/2018   Procedure: TRANSESOPHAGEAL ECHOCARDIOGRAM (TEE) Bubble Study;  Surgeon: MLarey Dresser MD;  [facility];  Service: Cardiovascular;   Laterality: N/A;   TUBAL LIGATION      Family History  Problem Relation Age of Onset   Liver cancer Maternal Grandmother    Lymphoma Maternal Aunt    Hypertension Mother    Renal Disease Father    Hypertension Father    Heart attack Neg Hx     Social History Social History   Tobacco Use   Smoking status: Former Smoker    Packs/day: 0.10    Years: 10.00    Pack years: 1.00    Types: Cigarettes    Start date: 10/21/2017    Quit date: 11/20/2018    Years since quitting: 0.0   Smokeless tobacco: Never Used  Substance Use Topics   Alcohol use: Yes    Alcohol/week: 1.0 standard drinks    Types: 1 Cans of beer per week    Comment: occasional beer   Drug use: Yes    Types: Marijuana, Cocaine    Comment: marijuana 11/27/2018  Prior to Admission medications   Medication Sig Start Date End Date Taking? Authorizing Provider  amLODipine (NORVASC) 10 MG tablet Take 1 tablet (10 mg total) by mouth at bedtime. 03/15/18  Yes Roxan Hockey, MD  aspirin EC 81 MG tablet Take 81 mg by mouth at bedtime.   Yes [provider]  calcitRIOL (ROCALTROL) 0.25 MCG capsule Take 7 capsules (1.75 mcg total) by mouth every other day. Patient taking differently: Take 1.75 mcg by mouth every Monday, Wednesday, and Friday. At hemodyalisis 10/27/17  Yes Phillips Grout, MD  cloNIDine (CATAPRES) 0.2 MG tablet Take 1 tablet (0.2 mg) by mouth twice daily - on dialysis days take after dialysis and at bedtime, on other days take one in the morning and one at night 09/04/18  Yes Fay Records, MD  ferric citrate (AURYXIA) 1 GM 210 MG(Fe) tablet Take 840 mg by mouth 3 (three) times daily with meals.    Yes [provider]  ibuprofen (ADVIL) 200 MG tablet Take 400 mg by mouth every 6 (six) hours as needed for moderate pain.   Yes [provider]  labetalol (NORMODYNE) 300 MG tablet Take 1 tablet (300 mg total) by mouth 2 (two) times daily. 08/29/18  Yes Fay Records, MD   multivitamin (RENA-VIT) TABS tablet Take 1 tablet by mouth at bedtime.    Yes [provider]  ondansetron (ZOFRAN) 4 MG tablet Take 4 mg by mouth every 6 (six) hours as needed for nausea or vomiting.   Yes [provider]  oxyCODONE-acetaminophen (PERCOCET) 7.5-325 MG tablet Take 1 tablet by mouth every 6 (six) hours as needed for severe pain. 03/06/18  Yes Rhyne, Samantha J, PA-C  pantoprazole (PROTONIX) 40 MG tablet Take 40 mg by mouth 2 (two) times daily.    Yes [provider]  hydrALAZINE (APRESOLINE) 25 MG tablet Take 1 tablet (25 mg total) by mouth 3 (three) times daily. Patient not taking: Reported on 10/30/2018 09/02/18   Fay Records, MD  lanthanum (FOSRENOL) 1000 MG chewable tablet Chew 2 tablets (2,000 mg total) by mouth 3 (three) times daily with meals. Patient not taking: Reported on 11/19/2018 05/05/15   Eugenie Filler, MD  predniSONE (DELTASONE) 10 MG tablet Take 10 mg by mouth daily with breakfast.    [provider]    No Known Allergies  Review of Systems:  General: normal appetite, decreased energy, no weight gain, no weight loss, no fever  Cardiac: no chest pain with exertion, no chest pain at rest, +SOB with moderate level exertion, no resting SOB, no PND, no orthopnea, no palpitations, no arrhythmia, no atrial fibrillation, no LE edema, no dizzy spells, no syncope  Respiratory: Mild exertional shortness of breath, no home oxygen, no current productive cough, occasional dry cough, recent episode bronchitis 6-8 weeks ago, no wheezing, no hemoptysis, no asthma, no pain with inspiration or cough, no sleep apnea, no CPAP at night  GI: no difficulty swallowing, no reflux, no frequent heartburn, no hiatal hernia, no abdominal pain, no constipation, no diarrhea, no hematochezia, no hematemesis, no melena  GU: Makes a very small amount of urine, no dysuria, no frequency, no urinary tract infection, no hematuria, no kidney stones, + chronic  kidney disease  Vascular: no pain suggestive of claudication, no pain in feet, no leg cramps, no varicose veins, no DVT, no non-healing foot ulcer  Neuro: + remote h/o stroke, no TIA's, no seizures, no headaches, no temporary blindness one eye, no slurred speech, no  peripheral neuropathy, no chronic pain, no instability of gait, no memory/cognitive dysfunction  Musculoskeletal: mild arthritis, no joint swelling, no myalgias, no difficulty walking, normal mobility  Skin: no rash, no itching, no skin infections, no pressure sores or ulcerations  Psych: no anxiety, no depression, no nervousness, no unusual recent stress  Eyes: no blurry vision, no floaters, no recent vision changes, does not wear glasses or contacts  ENT: no hearing loss, no loose or painful teeth, no dentures, last saw dentist 6 months ago  Hematologic: no easy bruising, no abnormal bleeding, no clotting disorder, no frequent epistaxis  Endocrine: no diabetes, does not check CBG's at home  Physical Exam:  BP 120/68 (BP Location: Left Arm, Patient Position: Sitting, Cuff Size: Normal)   Pulse 80   Temp (!) 96.5 F (35.8 C) (Skin)   Resp 18   Ht _0  (1.676 m)   Wt 135 lb (61.2 kg)   LMP 12/05/2010 (LMP Unknown)   SpO2 99% Comment: RA   BMI 21.79 kg/m  General: Thin AA female NAD  HEENT: Unremarkable  Neck: no JVD, no bruits, no adenopathy  Chest: clear to auscultation, symmetrical breath sounds, no wheezes, no rhonchi  CV: RRR, grade IV/VI harsh systolic murmur  Abdomen: soft, non-tender, no masses  Extremities: warm, well-perfused, pulses diminished, no LE edema  Rectal/GU Deferred  Neuro: Grossly non-focal and symmetrical throughout  Skin: Clean and dry, no rashes, no breakdown  Diagnostic Tests:  Transesophageal Echocardiography  Patient: Shantil, Vallejo  MR #: [id number]  Study Date: 01/02/2018  Gender: F  Age: 57  Height: 167.6 cm  Weight: 56.7 kg  BSA: 1.62 m^2  Pt. Status:  Room:  Daleen Squibb, M.D.   REFERRING Dorris Carnes, M.D.  ADMITTING Loralie Champagne, M.D.  ATTENDING Loralie Champagne, M.D.  PERFORMING Loralie Champagne, M.D.  SONOGRAPHER Roseanna Rainbow  cc:  -------------------------------------------------------------------  LV EF: 55% - 60%  -------------------------------------------------------------------  Indications: 424.0 Mitral valve disease. Severe valvular  disease  -------------------------------------------------------------------  History: PMH: ESRD. Lupus. Aortic stenosis. Mitral stenosis.  Stroke. Risk factors: Hypertension.  -------------------------------------------------------------------  Study Conclusions  - Left ventricle: The cavity size was normal. Wall thickness was  increased in a pattern of mild LVH. Systolic function was normal.  The estimated ejection fraction was in the range of 55% to 60%.  Wall motion was normal; there were no regional wall motion  abnormalities.  - Aortic valve: Functionally bicuspid aortic valve with fused  noncoronary and left coronary cusps. Moderate aortic stenosis  with mean gradient 26 mmHg. No AI.  - Aorta: Normal caliber thoracic aorta with grade III plaque  descending thoracic aorta.  - Mitral valve: The mitral valve was heavily calcified and  restricted. There was mild to moderate mitral regurgitation.  Mitral valve mean gradient 16 mmHg, PHT 78 msec, MVA 2.8 cm^2 by  PHT. By planimetry, MVA 1.5 cm^2. Looking at the valve, there  appears to be at least moderate mitral stenosis. I think that the  PHT-derived value is inaccurate.  - Left atrium: The atrium was mildly dilated. No evidence of  thrombus in the atrial cavity or appendage.  - Right ventricle: The cavity size was normal. Systolic function  was mildly reduced.  - Right atrium: No evidence of thrombus in the atrial cavity or  appendage.  - Atrial septum: No defect or patent foramen ovale was identified.  Echo contrast study showed no right-to-left atrial level  shunt,  at baseline or with provocation.  - Tricuspid valve: Mild  TR with peak RV-RA 41 mmHg.  Impressions:  - Moderate aortic stenosis with functionally bicuspid valve. There  was at least moderate mitral stenosis by mean gradient and  planimetry. I think that the pressure half-time derived value for  MVA was inaccurate. Mild to moderate MR.  -------------------------------------------------------------------  Study data: Study status: Routine. Consent: The risks,  benefits, and alternatives to the procedure were explained to the  patient and informed consent was obtained. Procedure: The patient  reported no pain pre or post test. Initial setup. The patient was  brought to the laboratory. Surface ECG leads were monitored.  Sedation. Conscious sedation was administered by cardiology staff.  Transesophageal echocardiography. Topical anesthesia was obtained  using viscous lidocaine. An adult multiplane transesophageal probe  was inserted by the attending cardiologistwithout difficulty. 3D  image quality was excellent. Intravenous contrast (agitated saline)  was administered. Study completion: The patient tolerated the  procedure well. There were no complications. Administered  medications: Fentanyl, 65mg, IV. Midazolam, 415m IV.  Diagnostic transesophageal echocardiography. 2D and color Doppler.  Birthdate: Patient birthdate: 09/15/1960-11-21Age: Patient is [age]r  old. Sex: Gender: female. BMI: 20.2 kg/m^2. Blood pressure:  180/110 Patient status: Inpatient. Study date: Study date:  01/02/2018. Study time: 09:57 AM. Location: Endoscopy.  -------------------------------------------------------------------  -------------------------------------------------------------------  Left ventricle: The cavity size was normal. Wall thickness was  increased in a pattern of mild LVH. Systolic function was normal.  The estimated ejection fraction was in the range of 55% to 60%.  Wall motion was normal;  there were no regional wall motion  abnormalities.  -------------------------------------------------------------------  Aortic valve: Functionally bicuspid aortic valve with fused  noncoronary and left coronary cusps. Moderate aortic stenosis with  mean gradient 26 mmHg. No AI.  -------------------------------------------------------------------  Aorta: Normal caliber thoracic aorta with grade III plaque  descending thoracic aorta.  -------------------------------------------------------------------  Mitral valve: The mitral valve was heavily calcified and  restricted. There was mild to moderate mitral regurgitation. Mitral  valve mean gradient 16 mmHg, PHT 78 msec, MVA 2.8 cm^2 by PHT. By  planimetry, MVA 1.5 cm^2. Looking at the valve, there appears to be  at least moderate mitral stenosis. I think that the PHT-derived  value is inaccurate. Doppler: Valve area by pressure  half-time: 2.78 cm^2. Indexed valve area by pressure half-time:  1.72 cm^2/m^2. Mean gradient (D): 14 mm Hg.  -------------------------------------------------------------------  Left atrium: The atrium was mildly dilated. No evidence of  thrombus in the atrial cavity or appendage.  -------------------------------------------------------------------  Atrial septum: No defect or patent foramen ovale was identified.  Echo contrast study showed no right-to-left atrial level shunt, at  baseline or with provocation.  -------------------------------------------------------------------  Right ventricle: The cavity size was normal. Systolic function was  mildly reduced.  -------------------------------------------------------------------  Pulmonic valve: Structurally normal valve. Cusp separation was  normal.  -------------------------------------------------------------------  Tricuspid valve: Mild TR with peak RV-RA 41 mmHg.  -------------------------------------------------------------------  Right atrium: The  atrium was normal in size. No evidence of  thrombus in the atrial cavity or appendage.  -------------------------------------------------------------------  Pericardium: There was no pericardial effusion.  -------------------------------------------------------------------  Post procedure conclusions  Ascending Aorta:  - Normal caliber thoracic aorta with grade III plaque descending  thoracic aorta.  -------------------------------------------------------------------  Measurements  Aortic valve Value  Aortic valve peak velocity, S 297 cm/s  Aortic valve mean velocity, S 195 cm/s  Aortic valve VTI, S 65.1 cm  Mitral valve Value  Mitral mean velocity, D 172 cm/s  Mitral pressure half-time 78 ms  Mitral mean  gradient, D 14 mm Hg  Mitral valve area, PHT, DP 2.78 cm^2  Mitral valve area/bsa, PHT, DP 1.72 cm^2/m^2  Mitral annulus VTI, D 55.9 cm  Legend:  (L) and (H) mark values outside specified reference range.  -------------------------------------------------------------------  Prepared and Electronically Authenticated by  Loralie Champagne, M.D.  2019-09-30T14:12:12  ECHOCARDIOGRAM REPORT  Patient Name: [name] Date of Exam: 07/03/2018  Medical Rec #: [id number] Height: 66.0 in  Accession #: [id number] Weight: 139.0 lb  Date of Birth: 04-05-1961 BSA: 1.71 m  Patient Age: 53 years BP: 140/87 mmHg  Patient Gender: F HR: 76 bpm.  Exam Location: [facility]  Procedure: 2D Echo, Cardiac Doppler and Color Doppler  Indications: I35.9 Aortic valve disorder.  History: Patient has prior history of Echocardiogram examinations, most  recent 01/02/2018. Stroke, Atrial Fibrillation; Signs/Symptoms:  Dyspnea; Risk Factors: Hypertension. Aortic stenosis. Mitral  stenosis. Anemia. End stage renal disease. Lupus.  Sonographer: Diamond Nickel RCS  Referring Phys: [id number] PAULA ROSS V  IMPRESSIONS  1. The left ventricle has normal systolic function with an ejection fraction of 60-65%. The cavity  size was normal. There is mildly increased left ventricular wall thickness. Left ventricular diastolic Doppler parameters are consistent with  pseudonormalization No evidence of left ventricular regional wall motion abnormalities.  2. The right ventricle has normal systolic function. The cavity was normal. There is no increase in right ventricular wall thickness.  3. There is severe calcification. There is moderate to severe mitral annular calcification present. Mitral valve regurgitation is mild to moderate by color flow Doppler. Mean gradient across the mitral valve = 21 mmHg. Valve area by PHT is not  significantly decreased, but visually and by mean gradient, the mitral stenosis appears severe.  4. The aortic valve is tricuspid There is severe calcifcation of the aortic valve. There is moderate stenosis of the aortic valve. Mean gradient 34 mmHg with AVA 1.23 cm^2.  5. The pulmonic valve was normal in structure.  6. The aortic root and ascending aorta are normal in size and structure.  7. Left atrial size was severely dilated.  8. Right atrial pressure is estimated at 3 mmHg.  9. PA systolic pressure 34 mmHg.  10. Trivial pericardial effusion.  11. The tricuspid valve is normal in structure.  FINDINGS  Left Ventricle: The left ventricle has normal systolic function, with an ejection fraction of 60-65%. The cavity size was normal. There is mildly increased left ventricular wall thickness. Left ventricular diastolic Doppler parameters are consistent  with pseudonormalization No evidence of left ventricular regional wall motion abnormalities..  Right Ventricle: The right ventricle has normal systolic function. The cavity was normal. There is no increase in right ventricular wall thickness.  Left Atrium: left atrial size was severely dilated  Right Atrium: right atrial size was normal in size Right atrial pressure is estimated at 3 mmHg.  Interatrial Septum: No atrial level shunt detected by color  flow Doppler.  Pericardium: Trivial pericardial effusion is present.  Mitral Valve: The mitral valve is degenerative in appearance. There is severe calcification. There is moderate to severe mitral annular calcification present. Mitral valve regurgitation is mild to moderate by color flow Doppler.  Tricuspid Valve: The tricuspid valve is normal in structure. Tricuspid valve regurgitation is trivial by color flow Doppler.  Aortic Valve: The aortic valve is tricuspid Aortic valve regurgitation was not visualized by color flow Doppler. There is moderate stenosis of the aortic valve, with a calculated valve area of 1.09 cm.  Pulmonic Valve: The  pulmonic valve was normal in structure. Pulmonic valve regurgitation is not visualized by color flow Doppler.  Aorta: The aortic root and ascending aorta are normal in size and structure.  Venous: The inferior vena cava is normal in size with greater than 50% respiratory variability.  LEFT VENTRICLE  PLAX 2D (Teich)  LV EF: 82.9 % Diastology  LVIDd: 4.10 cm LV e' lateral: 4.64 cm/s  LVIDs: 2.00 cm LV E/e' lateral: 52.6  LV PW: 1.50 cm LV e' medial: 6.09 cm/s  LV IVS: 1.30 cm LV E/e' medial: 40.1  LVOT diam: 1.95 cm  LV SV: 61 ml  LVOT Area: 2.99 cm  RIGHT VENTRICLE  RV Basal diam: 2.34 cm  RV S prime: 10.70 cm/s  TAPSE (M-mode): 2.1 cm  RVSP: 34.4 mmHg  LEFT ATRIUM Index RIGHT ATRIUM Index  LA diam: 5.70 cm 3.33 cm/m RA Pressure: 3 mmHg  LA Vol (A2C): 131.0 ml 76.46 ml/m RA Area: 14.60 cm  LA Vol (A4C): 153.0 ml 89.30 ml/m RA Volume: 32.10 ml 18.74 ml/m  LA Biplane Vol: 142.0 ml 82.88 ml/m  AORTIC VALVE  AV Area (Vmax): 1.11 cm  AV Area (Vmean): 1.16 cm  AV Area (VTI): 1.09 cm  AV Vmax: 406.67 cm/s  AV Vmean: 264.000 cm/s  AV VTI: 0.931 m  AV Peak Grad: 66.2 mmHg  AV Mean Grad: 33.0 mmHg  LVOT Vmax: 151.00 cm/s  LVOT Vmean: 102.967 cm/s  LVOT VTI: 0.341 m  LVOT/AV VTI ratio: 0.37  AORTA  Ao Root diam: 2.80 cm  MITRAL VALVE  TRICUSPID VALVE  MV Area (PHT): 3.91 cm TR Peak grad: 31.4 mmHg  MV Peak grad: 48.5 mmHg TR Vmax: 280.00 cm/s  MV Mean grad: 21.7 mmHg RVSP: 34.4 mmHg  MV Vmax: 3.48 m/s  MV Vmean: 214.3 cm/s  MV VTI: 0.99 m  MV PHT: 56.26 msec  MV Decel Time: 194 msec  MR Peak grad: 146.4 mmHg  MR Mean grad: 98.3 mmHg  MR Vmax: 605.00 cm/s  MR Vmean: 470.3 cm/s  MV E velocity: 244.00 cm/s  MV A velocity: 208.00 cm/s  MV E/A ratio: 1.17  Loralie Champagne MD  Electronically signed by Loralie Champagne MD  Signature Date/Time: 07/03/2018/2:52:07 PM  RIGHT/LEFT HEART CATH AND CORONARY ANGIOGRAPHY  Conclusion  1. No significant CAD.  2. Mildly elevated PCWP.  3. Preserved cardiac output.  4. 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  5. Severe mitral stenosis.  6. Moderate aortic stenosis.   Procedural Details  Technical Details Procedure: Right Heart Cath, Left Heart Cath, Selective Coronary Angiography  Indication: Assessment of mitral and aortic stenosis.   Procedural Details: The right groin was prepped, draped, and anesthetized with 1% lidocaine. Using the modified Seldinger technique a 5 French sheath was placed in the right femoral artery and a 7 French sheath was placed in the right femoral vein. A Swan-Ganz catheter was used for the right heart catheterization. Standard protocol was followed for recording of right heart pressures and sampling of oxygen saturations. Fick and thermodilution cardiac output was calculated. Standard Judkins catheters were used for selective coronary angiography. The aortic valve was crossed with a straight valve. There were no immediate procedural complications. The patient was transferred to the post catheterization recovery area for further monitoring.   Estimated blood loss <50 mL.   During this procedure medications were administered to achieve and maintain moderate conscious sedation while the patient's heart rate, blood pressure, and oxygen  saturation were continuously monitored and I was present face-to-face 100%  of this time.  Medications  (Filter: Administrations occurring from 07/24/18 0717 to 07/24/18 0851)          Medication Rate/Dose/Volume Action  Date Time   midazolam (VERSED) injection (mg) 1 mg Given 07/24/18 0742   Total dose as of 09/23/18 1526 1 mg Given 0749   3 mg 1 mg Given 0805   fentaNYL (SUBLIMAZE) injection (mcg) 25 mcg Given 07/24/18 0742   Total dose as of 09/23/18 1526 25 mcg Given 0748   75 mcg 25 mcg Given 0805   lidocaine (PF) (XYLOCAINE) 1 % injection (mL) 10 mL Given 07/24/18 0755   Total dose as of 09/23/18 1526 10 mL Given 0755   20 mL        Heparin (Porcine) in NaCl 1000-0.9 UT/500ML-% SOLN (mL) 500 mL Given 07/24/18 0800   Total dose as of 09/23/18 1526 500 mL Given 0801   1,000 mL        iohexol (OMNIPAQUE) 350 MG/ML injection (mL) 50 mL Given 07/24/18 0830   Total dose as of 09/23/18 1526        50 mL        fentaNYL (SUBLIMAZE) injection (mcg) 25 mcg Given 07/24/18 0820   Total dose as of 09/23/18 1526        25 mcg        midazolam (VERSED) injection (mg) 1 mg Given 07/24/18 0820   Total dose as of 09/23/18 1526        1 mg        Sedation Time  Sedation Time Physician-1: 56 minutes 24 seconds  Coronary Findings  Diagnostic  Dominance: Right  Left Main  No significant disease.  Left Anterior Descending  Luminal irregularities.  Left Circumflex  Luminal irregularities.  Right Coronary Artery  Luminal irregularities.  Intervention  No interventions have been documented.  Right Heart  Right Heart Pressures RHC Procedural Findings: Hemodynamics (mmHg) RA mean 4 RV 74/3 PA 64/21, mean 42 PCWP mean 19 LV 169/10 AO 158/73  Oxygen saturations: PA 67% AO 99%  Cardiac Output (Fick) 6.15  Cardiac Index (Fick) 3.64 PVR 3.7 WU  Cardiac Output (Thermo) 7.15 Cardiac Index (Thermo) 4.23  PVR 3.2 WU  Mitral valve:  Mean gradient 22 mmHg MVA (Fick) 1.38 cm^2 MVA  (Thermo) 1.6 cm^2  Aortic valve:  Mean gradient 10 mmHg, AVA 1.73 cm^2 (simultaneous FA/LV) Mean gradient 24 mmHg, AVA 1.41 cm^2 (pull back)  Implants     No implant documentation for this case.  Syngo Images  Link to Procedure Log   Show images for CARDIAC CATHETERIZATION Procedure Log  Images on Long Term Storage    Show images for Zharia, [place]row   [facility] Data   Most Recent Value  Fick Cardiac Output 6.15 L/min  Fick Cardiac Output Index 3.64 (L/min)/BSA  Thermal Cardiac Output 7.15 L/min  Thermal Cardiac Output Index 4.23 (L/min)/BSA  Aortic Mean Gradient 9.95 mmHg  Aortic Peak Gradient 5 mmHg  Aortic Valve Area 2.01  Aortic Value Area Index 1.19 cm2/BSA  Mitral Mean Gradient 21.87 mmHg  Mitral Peak Gradient 16 mmHg  Mitral Valve Area Index 0.95 cm2/BSA  RA A Wave 7 mmHg  RA V Wave 6 mmHg  RA Mean 4 mmHg  RV Systolic Pressure 74 mmHg  RV Diastolic Pressure 0 mmHg  RV EDP 3 mmHg  PA Systolic Pressure 64 mmHg  PA Diastolic Pressure 21 mmHg  PA Mean 42 mmHg  PW A Wave 25 mmHg  PW V  Wave 15 mmHg  PW Mean 24 mmHg  AO Systolic Pressure 182 mmHg  AO Diastolic Pressure 73 mmHg  AO Mean 993 mmHg  LV Systolic Pressure 716 mmHg  LV Diastolic Pressure 4 mmHg  LV EDP 9 mmHg  AOp Systolic Pressure 967 mmHg  AOp Diastolic Pressure 72 mmHg  AOp Mean Pressure 893 mmHg  LVp Systolic Pressure 810 mmHg  LVp Diastolic Pressure 4 mmHg  LVp EDP Pressure 12 mmHg  TPVR Index 9.93 HRUI  TSVR Index 26.23 HRUI  PVR SVR Ratio 0.21  TPVR/TSVR Ratio 0.38   RIGHT/LEFT HEART CATH AND CORONARY ANGIOGRAPHY  Conclusion  1. No significant CAD.  2. Mildly elevated PCWP.  3. Preserved cardiac output.  4. 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  5. Severe mitral stenosis.  6. Moderate aortic stenosis.   Procedural Details  Technical Details Procedure: Right Heart Cath, Left Heart Cath, Selective Coronary Angiography  Indication: Assessment of mitral and aortic  stenosis.   Procedural Details: The right groin was prepped, draped, and anesthetized with 1% lidocaine. Using the modified Seldinger technique a 5 French sheath was placed in the right femoral artery and a 7 French sheath was placed in the right femoral vein. A Swan-Ganz catheter was used for the right heart catheterization. Standard protocol was followed for recording of right heart pressures and sampling of oxygen saturations. Fick and thermodilution cardiac output was calculated. Standard Judkins catheters were used for selective coronary angiography. The aortic valve was crossed with a straight valve. There were no immediate procedural complications. The patient was transferred to the post catheterization recovery area for further monitoring.   Estimated blood loss <50 mL.   During this procedure medications were administered to achieve and maintain moderate conscious sedation while the patient's heart rate, blood pressure, and oxygen saturation were continuously monitored and I was present face-to-face 100% of this time.  Medications  (Filter: Administrations occurring from 07/24/18 0717 to 07/24/18 0851)          Medication Rate/Dose/Volume Action  Date Time   midazolam (VERSED) injection (mg) 1 mg Given 07/24/18 0742   Total dose as of 09/23/18 1639 1 mg Given 0749   3 mg 1 mg Given 0805   fentaNYL (SUBLIMAZE) injection (mcg) 25 mcg Given 07/24/18 0742   Total dose as of 09/23/18 1639 25 mcg Given 0748   75 mcg 25 mcg Given 0805   lidocaine (PF) (XYLOCAINE) 1 % injection (mL) 10 mL Given 07/24/18 0755   Total dose as of 09/23/18 1639 10 mL Given 0755   20 mL        Heparin (Porcine) in NaCl 1000-0.9 UT/500ML-% SOLN (mL) 500 mL Given 07/24/18 0800   Total dose as of 09/23/18 1639 500 mL Given 0801   1,000 mL        iohexol (OMNIPAQUE) 350 MG/ML injection (mL) 50 mL Given 07/24/18 0830   Total dose as of 09/23/18 1639        50 mL        fentaNYL (SUBLIMAZE) injection (mcg) 25 mcg  Given 07/24/18 0820   Total dose as of 09/23/18 1639        25 mcg        midazolam (VERSED) injection (mg) 1 mg Given 07/24/18 0820   Total dose as of 09/23/18 1639        1 mg        Sedation Time  Sedation Time Physician-1: 56 minutes 24 seconds  Coronary Findings  Diagnostic  Dominance: Right  Left Main  No significant disease.  Left Anterior Descending  Luminal irregularities.  Left Circumflex  Luminal irregularities.  Right Coronary Artery  Luminal irregularities.  Intervention  No interventions have been documented.  Right Heart  Right Heart Pressures RHC Procedural Findings: Hemodynamics (mmHg) RA mean 4 RV 74/3 PA 64/21, mean 42 PCWP mean 19 LV 169/10 AO 158/73  Oxygen saturations: PA 67% AO 99%  Cardiac Output (Fick) 6.15  Cardiac Index (Fick) 3.64 PVR 3.7 WU  Cardiac Output (Thermo) 7.15 Cardiac Index (Thermo) 4.23  PVR 3.2 WU  Mitral valve:  Mean gradient 22 mmHg MVA (Fick) 1.38 cm^2 MVA (Thermo) 1.6 cm^2  Aortic valve:  Mean gradient 10 mmHg, AVA 1.73 cm^2 (simultaneous FA/LV) Mean gradient 24 mmHg, AVA 1.41 cm^2 (pull back)  Implants     No implant documentation for this case.  Syngo Images  Link to Procedure Log   Show images for CARDIAC CATHETERIZATION Procedure Log  Images on Long Term Storage    Show images for Andra, Heslin   [facility] Data   Most Recent Value  Fick Cardiac Output 6.15 L/min  Fick Cardiac Output Index 3.64 (L/min)/BSA  Thermal Cardiac Output 7.15 L/min  Thermal Cardiac Output Index 4.23 (L/min)/BSA  Aortic Mean Gradient 9.95 mmHg  Aortic Peak Gradient 5 mmHg  Aortic Valve Area 2.01  Aortic Value Area Index 1.19 cm2/BSA  Mitral Mean Gradient 21.87 mmHg  Mitral Peak Gradient 16 mmHg  Mitral Valve Area Index 0.95 cm2/BSA  RA A Wave 7 mmHg  RA V Wave 6 mmHg  RA Mean 4 mmHg  RV Systolic Pressure 74 mmHg  RV Diastolic Pressure 0 mmHg  RV EDP 3 mmHg  PA Systolic Pressure 64 mmHg  PA Diastolic Pressure 21 mmHg  PA  Mean 42 mmHg  PW A Wave 25 mmHg  PW V Wave 15 mmHg  PW Mean 24 mmHg  AO Systolic Pressure 341 mmHg  AO Diastolic Pressure 73 mmHg  AO Mean 937 mmHg  LV Systolic Pressure 902 mmHg  LV Diastolic Pressure 4 mmHg  LV EDP 9 mmHg  AOp Systolic Pressure 409 mmHg  AOp Diastolic Pressure 72 mmHg  AOp Mean Pressure 735 mmHg  LVp Systolic Pressure 329 mmHg  LVp Diastolic Pressure 4 mmHg  LVp EDP Pressure 12 mmHg  TPVR Index 9.93 HRUI  TSVR Index 26.23 HRUI  PVR SVR Ratio 0.21  TPVR/TSVR Ratio 0.38     Cardiac TAVR CT  TECHNIQUE: The patient was scanned on a [employer]. A 120 kV retrospective scan was triggered in the descending thoracic aorta at 111 HU's. Gantry rotation speed was 250 msecs and collimation was .6 mm. No beta blockade or nitro were given. The 3D data set was reconstructed in 5% intervals of the R-R cycle. Systolic and diastolic phases were analyzed on a dedicated work station using MPR, MIP and VRT modes. The patient received 80 cc of contrast.  FINDINGS: Aortic Valve: Trileaflet aortic valve with moderate leaflet thickening, mild calcifications, moderate leaflet opening restrictions and only minimal calcifications extending into the LVOT.  Aorta: Normal size with mild atherosclerotic plaque and calcifications in the thoracic aorta and moderate to severe, almost circumferential calcifications in the distal abdominal aorta extending into iliofemoral arteries bilaterally  Sinotubular Junction: 28 x 28 mm  Ascending Thoracic Aorta: 35 x 34 mm  Aortic Arch: 30 x 28 mm  Descending Thoracic Aorta: 24 x 24 mm  Sinus of Valsalva Measurements:  Non-coronary: 30 mm  Right -coronary: 27 mm  Left -coronary: 31 mm  Coronary Artery Height above Annulus:  Left Main: 8 mm  Right Coronary: 14 mm  Virtual Basal Annulus Measurements:  Maximum/Minimum Diameter: 27.2 x 18.8 mm  Mean Diameter: 22.2 mm  Perimeter: 72.1 mm  Area:  388 mm2  Optimum Fluoroscopic Angle for Delivery: LAO 8 CAU 6  IMPRESSION: 1. Trileaflet aortic valve with moderate leaflet thickening, mild calcifications, moderate leaflet opening restrictions and only minimal calcifications extending into the LVOT. Annular measurements are suitable for delivery of a 23 mm Edwards-SAPIEN 3 valve.  2. Sufficient coronary to annulus distance.  3. Optimum Fluoroscopic Angle for Delivery: LAO 8 CAU 6.  4. Aorta: Normal size with mild atherosclerotic plaque and calcifications in the thoracic aorta and moderate to severe, almost circumferential calcifications in the distal abdominal aorta extending into iliofemoral arteries bilaterally  5. No thrombus in the left atrial appendage.  6. Severe circumferential mitral annular calcifications.  Electronically Signed: By: Ena Dawley On: 10/30/2018 14:36   CT ANGIOGRAPHY CHEST, ABDOMEN AND PELVIS  TECHNIQUE: Multidetector CT imaging through the chest, abdomen and pelvis was performed using the standard protocol during bolus administration of intravenous contrast. Multiplanar reconstructed images and MIPs were obtained and reviewed to evaluate the vascular anatomy.  CONTRAST: 181m OMNIPAQUE IOHEXOL 350 MG/ML SOLN  COMPARISON: CT the abdomen and pelvis 05/15/2017. No prior chest CT.  FINDINGS: CTA CHEST FINDINGS  Cardiovascular: Heart size is enlarged. There is no significant pericardial fluid, thickening or pericardial calcification. There is aortic atherosclerosis, as well as atherosclerosis of the great vessels of the mediastinum and the coronary arteries, including calcified atherosclerotic plaque in the left anterior descending, left circumflex and right coronary arteries. Thickening calcification of the aortic valve. Severe calcifications of the mitral valve and mitral annulus.  Mediastinum/Lymph Nodes: No pathologically enlarged mediastinal or hilar lymph  nodes. Esophagus is unremarkable in appearance. No axillary lymphadenopathy.  Lungs/Pleura: Small calcified granuloma in the right middle lobe. No other suspicious appearing pulmonary nodules or masses are noted. No acute consolidative airspace disease. No pleural effusions. Mild linear scarring or subsegmental atelectasis in the left lower lobe.  Musculoskeletal/Soft Tissues: There are no aggressive appearing lytic or blastic lesions noted in the visualized portions of the skeleton.  CTA ABDOMEN AND PELVIS FINDINGS  Hepatobiliary: No suspicious cystic or solid hepatic lesions. No intra or extrahepatic biliary ductal dilatation. Gallbladder is normal in appearance.  Pancreas: No pancreatic mass. No pancreatic ductal dilatation. No pancreatic or peripancreatic fluid or inflammatory changes.  Spleen: Numerous calcified granulomas throughout the spleen.  Adrenals/Urinary Tract: Multiple low-attenuation lesions are noted within both kidneys, largest of which are all compatible with cysts, with the largest of these measuring up to 2.9 cm in the lower pole of the left kidney. Most of these are simple in appearance. This largest lesion does have a small calcification in the inferior aspect of the wall (coronal image 83 of series 16). No hydroureteronephrosis. Urinary bladder is normal in appearance. Bilateral adrenal glands are normal in appearance.  Stomach/Bowel: Normal appearance of the stomach. No pathologic dilatation of small bowel or colon. Numerous colonic diverticulae are noted, without surrounding inflammatory changes to suggest an acute diverticulitis at this time. Normal appendix.  Vascular/Lymphatic: Aortic atherosclerosis, with vascular findings and measurements pertinent to potential TAVR procedure, as detailed below. No aneurysm or dissection in the abdominal or pelvic vasculature. No lymphadenopathy noted in the abdomen or pelvis.  Reproductive: Several  densely calcified lesions are noted in the uterus,  largest of which is in the fundus measuring 2.5 cm in diameter, presumably small fibroids. Ovaries are a trophic.  Other: No significant volume of ascites. No pneumoperitoneum.  Musculoskeletal: There are no aggressive appearing lytic or blastic lesions noted in the visualized portions of the skeleton.  VASCULAR MEASUREMENTS PERTINENT TO TAVR:  AORTA:  Minimal Aortic Diameter-12 x 14 mm  Severity of Aortic Calcification-severe  RIGHT PELVIS:  Right Common Iliac Artery -  Minimal Diameter-8.2 x 6.3 mm  Tortuosity - mild  Calcification-moderate to severe  Right External Iliac Artery -  Minimal Diameter-7.2 x 5.2 mm  Tortuosity - mild  Calcification - mild  Right Common Femoral Artery -  Minimal Diameter-6.5 x 5.6 mm  Tortuosity - mild  Calcification-moderate  LEFT PELVIS:  Left Common Iliac Artery -  Minimal Diameter-8.0 x 7.5 mm  Tortuosity-mild  Calcification-moderate  Left External Iliac Artery -  Minimal Diameter-6.2 x 5.3 mm  Tortuosity - mild  Calcification - mild  Left Common Femoral Artery -  Minimal Diameter-6.6 x 5.9 mm  Tortuosity - mild  Calcification - mild  Review of the MIP images confirms the above findings.  IMPRESSION: 1. Vascular findings and measurements pertinent to potential TAVR procedure, as detailed above. 2. Thickening calcification of the aortic valve, compatible with the reported clinical history of severe aortic stenosis. 3. There is also a very severe thickening calcification of the mitral valve and mitral annulus. 4. Aortic atherosclerosis, in addition to three-vessel coronary artery disease. Please note that although the presence of coronary artery calcium documents the presence of coronary artery disease, the severity of this disease and any potential stenosis cannot be assessed on this non-gated CT examination.  Assessment for potential risk factor modification, dietary therapy or pharmacologic therapy may be warranted, if clinically indicated. 5. Colonic diverticulosis without evidence of acute diverticulitis at this time. 6. Additional incidental findings, as above.   Electronically Signed By: Vinnie Langton M.D. On: 10/30/2018 16:32   Impression:  Patient has stage D severe symptomatic aortic and mitral stenosis.She describe stable symptoms of exertional shortness of breath and fatigue consistent with chronic diastolic congestive heart failure, [place] Heart Association functional class IIb-III.I have personally reviewed the patient'smostrecent transthoracic echocardiogram,diagnostic cardiac catheterizationand CT angiogramsas well as the transesophageal echocardiogram performed September 2019. Shehas at least moderate aortic stenosis and severe mitral stenosis. On recent transthoracic echocardiogram peak velocity across aortic valve measured greater than 4.0 m/s corresponding to mean transvalvular gradient estimated 33 mmHg. There was moderate thickening and restricted leaflet mobility. The DVI was relatively high at 0.37 suggesting relatively high flow. The transesophageal echocardiogram performed last fall revealed severe calcification surrounding the mitral annulus with severely restricted leaflet mobility. There is severe mitral stenosisand mild to moderate mitral regurgitation. Transvalvular gradients across the mitral valve have been estimated greater than 20 mmHg both by echocardiogram and diagnostic cardiac catheterization. Left ventricular systolic function remains preserved although the patient does have significant left ventricular hypertrophy and diastolicdysfunction.Recent right heart catheterization revealed moderate pulmonary hypertension and mildly elevated pulmonary capillary wedge pressure with relatively high cardiac output. Right heart size and function  function appear normal and there is trivial tricuspid regurgitation.CT angiography confirmed the presence of a trileaflet aortic valve and severe leaflet pathology consistent with both severe aortic and mitral stenosis. There is severe calcification surrounding the mitral annulus. The calcification does not extend close to the aortic annulus and the aortic-mitral curtainappearsrelatively spared of calcification.  Options include continued medical therapy with close observation versus proceeding with  aortic and mitral valve replacement. I do not feel that transcatheter aortic valve replacement with or without catheter-based approach to treatment of the patient's mitral valve disease would be a reasonable option. The patient's mitral valve disease is quite severe and balloon mitral valvuloplasty does not appear to be an option and there are no commercially available transcatheter mitral valve devices currently availableto treat native valve stenosis.Risks associated with conventional surgery will unquestionably be high because of the patient's numerous comorbid medical problems, most notablyincludingdialysis-dependent renal failure. The severity ofmitralvalve andannular calcification may put her at relatively high risk for surgical complications including the development of paravalvular leak. In addition, the patient has remote history of stroke and a history of atrial fibrillation has been mentioned on one occasion in the past, although the patient does not carry a diagnosis of recurrent paroxysmal atrial fibrillation and she is not currently on long-term anticoagulation. Risk of stroke may be significant.   Plan:  The patient and her mother were again counseled at length regarding the treatment options for management of severe symptomatic aortic and mitral stenosis. Continued medical therapy with close follow-up is been contrasted with high risk double valve replacement. Discussion  was held comparing the relative risks of mechanical valve replacement with need for lifelong anticoagulation versus use of a bioprosthetic tissue valve and the associated potential for late structural valve deterioration and failure. This discussion was placed in the context of the patient's particular circumstances including dialysis-dependent renal failure.  The patient specifically requests that her valve be replaced using mechanical prosthetic valves.  She understands that this will require lifelong anticoagulation using warfarin.  They understand and accept all potential risks of surgery including but not limited to risk of death, stroke or other neurologic complication, myocardial infarction, congestive heart failure, respiratory failure, renal failure, bleeding requiring transfusion and/or reexploration, heart block or bradycardia requiring permanent pacemaker implantation, arrhythmia, infection or other wound complications, pneumonia, pleural and/or pericardial effusion, pulmonary embolus, aortic dissection or other major vascular complication, or delayed complications related to valve repair or replacement including but not limited to structural valve deterioration and failure, thrombosis, embolization, endocarditis, or paravalvular leak.  Expectations for her postoperative convalescence have been discussed.  All of their questions have been answered.       Valentina Gu. Roxy Manns, MD 12/01/2018 3:34 PM

## 2018-12-02 ENCOUNTER — Inpatient Hospital Stay (HOSPITAL_COMMUNITY): Payer: Medicare Other

## 2018-12-02 ENCOUNTER — Inpatient Hospital Stay (HOSPITAL_COMMUNITY): Payer: Medicare Other | Admitting: Physician Assistant

## 2018-12-02 ENCOUNTER — Encounter (HOSPITAL_COMMUNITY): Payer: Self-pay

## 2018-12-02 ENCOUNTER — Inpatient Hospital Stay (HOSPITAL_COMMUNITY): Payer: Medicare Other | Admitting: Certified Registered Nurse Anesthetist

## 2018-12-02 ENCOUNTER — Inpatient Hospital Stay (HOSPITAL_COMMUNITY)
Admission: RE | Admit: 2018-12-02 | Discharge: 2018-12-11 | DRG: 219 | Disposition: A | Discharge status: Home / Self Care | Payer: Medicare Other | Source: Ambulatory Visit | Attending: Thoracic Surgery (Cardiothoracic Vascular Surgery) | Admitting: Thoracic Surgery (Cardiothoracic Vascular Surgery)

## 2018-12-02 ENCOUNTER — Encounter (HOSPITAL_COMMUNITY)
Admission: RE | Disposition: A | Discharge status: Home / Self Care | Payer: Self-pay | Source: Home / Self Care | Attending: Thoracic Surgery (Cardiothoracic Vascular Surgery)

## 2018-12-02 DIAGNOSIS — M1990 Unspecified osteoarthritis, unspecified site: Secondary | ICD-10-CM | POA: Diagnosis present

## 2018-12-02 DIAGNOSIS — I700 Atherosclerosis of aorta: Secondary | ICD-10-CM | POA: Diagnosis present

## 2018-12-02 DIAGNOSIS — D631 Anemia in chronic kidney disease: Secondary | ICD-10-CM | POA: Diagnosis present

## 2018-12-02 DIAGNOSIS — R112 Nausea with vomiting, unspecified: Secondary | ICD-10-CM | POA: Diagnosis not present

## 2018-12-02 DIAGNOSIS — Z79891 Long term (current) use of opiate analgesic: Secondary | ICD-10-CM

## 2018-12-02 DIAGNOSIS — I509 Heart failure, unspecified: Secondary | ICD-10-CM | POA: Diagnosis not present

## 2018-12-02 DIAGNOSIS — N2581 Secondary hyperparathyroidism of renal origin: Secondary | ICD-10-CM | POA: Diagnosis present

## 2018-12-02 DIAGNOSIS — D649 Anemia, unspecified: Secondary | ICD-10-CM | POA: Diagnosis present

## 2018-12-02 DIAGNOSIS — J9811 Atelectasis: Secondary | ICD-10-CM | POA: Diagnosis not present

## 2018-12-02 DIAGNOSIS — J939 Pneumothorax, unspecified: Secondary | ICD-10-CM | POA: Diagnosis not present

## 2018-12-02 DIAGNOSIS — Z952 Presence of prosthetic heart valve: Secondary | ICD-10-CM

## 2018-12-02 DIAGNOSIS — G8929 Other chronic pain: Secondary | ICD-10-CM | POA: Diagnosis present

## 2018-12-02 DIAGNOSIS — Z8711 Personal history of peptic ulcer disease: Secondary | ICD-10-CM

## 2018-12-02 DIAGNOSIS — M329 Systemic lupus erythematosus, unspecified: Secondary | ICD-10-CM | POA: Diagnosis present

## 2018-12-02 DIAGNOSIS — I447 Left bundle-branch block, unspecified: Secondary | ICD-10-CM | POA: Diagnosis present

## 2018-12-02 DIAGNOSIS — I080 Rheumatic disorders of both mitral and aortic valves: Secondary | ICD-10-CM | POA: Diagnosis not present

## 2018-12-02 DIAGNOSIS — I132 Hypertensive heart and chronic kidney disease with heart failure and with stage 5 chronic kidney disease, or end stage renal disease: Secondary | ICD-10-CM | POA: Diagnosis present

## 2018-12-02 DIAGNOSIS — Z7982 Long term (current) use of aspirin: Secondary | ICD-10-CM

## 2018-12-02 DIAGNOSIS — E875 Hyperkalemia: Secondary | ICD-10-CM | POA: Diagnosis not present

## 2018-12-02 DIAGNOSIS — Z86010 Personal history of colonic polyps: Secondary | ICD-10-CM

## 2018-12-02 DIAGNOSIS — Z992 Dependence on renal dialysis: Secondary | ICD-10-CM | POA: Diagnosis not present

## 2018-12-02 DIAGNOSIS — Z79899 Other long term (current) drug therapy: Secondary | ICD-10-CM

## 2018-12-02 DIAGNOSIS — Z8673 Personal history of transient ischemic attack (TIA), and cerebral infarction without residual deficits: Secondary | ICD-10-CM

## 2018-12-02 DIAGNOSIS — Z87891 Personal history of nicotine dependence: Secondary | ICD-10-CM

## 2018-12-02 DIAGNOSIS — R918 Other nonspecific abnormal finding of lung field: Secondary | ICD-10-CM | POA: Diagnosis not present

## 2018-12-02 DIAGNOSIS — Z4682 Encounter for fitting and adjustment of non-vascular catheter: Secondary | ICD-10-CM | POA: Diagnosis not present

## 2018-12-02 DIAGNOSIS — I2510 Atherosclerotic heart disease of native coronary artery without angina pectoris: Secondary | ICD-10-CM | POA: Diagnosis present

## 2018-12-02 DIAGNOSIS — I272 Pulmonary hypertension, unspecified: Secondary | ICD-10-CM | POA: Diagnosis not present

## 2018-12-02 DIAGNOSIS — Z452 Encounter for adjustment and management of vascular access device: Secondary | ICD-10-CM | POA: Diagnosis not present

## 2018-12-02 DIAGNOSIS — I5032 Chronic diastolic (congestive) heart failure: Secondary | ICD-10-CM | POA: Diagnosis present

## 2018-12-02 DIAGNOSIS — I10 Essential (primary) hypertension: Secondary | ICD-10-CM | POA: Diagnosis present

## 2018-12-02 DIAGNOSIS — N186 End stage renal disease: Secondary | ICD-10-CM

## 2018-12-02 DIAGNOSIS — I120 Hypertensive chronic kidney disease with stage 5 chronic kidney disease or end stage renal disease: Secondary | ICD-10-CM | POA: Diagnosis not present

## 2018-12-02 DIAGNOSIS — F40240 Claustrophobia: Secondary | ICD-10-CM | POA: Diagnosis present

## 2018-12-02 DIAGNOSIS — I110 Hypertensive heart disease with heart failure: Secondary | ICD-10-CM | POA: Diagnosis present

## 2018-12-02 DIAGNOSIS — D696 Thrombocytopenia, unspecified: Secondary | ICD-10-CM | POA: Diagnosis present

## 2018-12-02 DIAGNOSIS — I440 Atrioventricular block, first degree: Secondary | ICD-10-CM | POA: Diagnosis present

## 2018-12-02 DIAGNOSIS — I083 Combined rheumatic disorders of mitral, aortic and tricuspid valves: Secondary | ICD-10-CM | POA: Diagnosis present

## 2018-12-02 DIAGNOSIS — K219 Gastro-esophageal reflux disease without esophagitis: Secondary | ICD-10-CM | POA: Diagnosis present

## 2018-12-02 DIAGNOSIS — Z7952 Long term (current) use of systemic steroids: Secondary | ICD-10-CM

## 2018-12-02 DIAGNOSIS — D62 Acute posthemorrhagic anemia: Secondary | ICD-10-CM | POA: Diagnosis not present

## 2018-12-02 DIAGNOSIS — I350 Nonrheumatic aortic (valve) stenosis: Secondary | ICD-10-CM | POA: Diagnosis not present

## 2018-12-02 DIAGNOSIS — J90 Pleural effusion, not elsewhere classified: Secondary | ICD-10-CM | POA: Diagnosis not present

## 2018-12-02 DIAGNOSIS — Z954 Presence of other heart-valve replacement: Secondary | ICD-10-CM

## 2018-12-02 DIAGNOSIS — Z951 Presence of aortocoronary bypass graft: Secondary | ICD-10-CM | POA: Diagnosis not present

## 2018-12-02 DIAGNOSIS — K5730 Diverticulosis of large intestine without perforation or abscess without bleeding: Secondary | ICD-10-CM | POA: Diagnosis present

## 2018-12-02 DIAGNOSIS — I050 Rheumatic mitral stenosis: Secondary | ICD-10-CM | POA: Diagnosis present

## 2018-12-02 DIAGNOSIS — Z8249 Family history of ischemic heart disease and other diseases of the circulatory system: Secondary | ICD-10-CM

## 2018-12-02 DIAGNOSIS — Z09 Encounter for follow-up examination after completed treatment for conditions other than malignant neoplasm: Secondary | ICD-10-CM

## 2018-12-02 DIAGNOSIS — I342 Nonrheumatic mitral (valve) stenosis: Secondary | ICD-10-CM | POA: Diagnosis not present

## 2018-12-02 HISTORY — PX: MITRAL VALVE REPLACEMENT: SHX147

## 2018-12-02 HISTORY — PX: TEE WITHOUT CARDIOVERSION: SHX5443

## 2018-12-02 HISTORY — PX: AORTIC VALVE REPLACEMENT: SHX41

## 2018-12-02 HISTORY — DX: Presence of other heart-valve replacement: Z95.4

## 2018-12-02 LAB — POCT I-STAT 4, (NA,K, GLUC, HGB,HCT)
Glucose, Bld: 122 mg/dL — ABNORMAL HIGH (ref 70–99)
Glucose, Bld: 140 mg/dL — ABNORMAL HIGH (ref 70–99)
Glucose, Bld: 124 mg/dL — ABNORMAL HIGH (ref 70–99)
Glucose, Bld: 114 mg/dL — ABNORMAL HIGH (ref 70–99)
Glucose, Bld: 116 mg/dL — ABNORMAL HIGH (ref 70–99)
Glucose, Bld: 93 mg/dL (ref 70–99)
HCT: 39 % (ref 36.0–46.0)
HCT: 36 % (ref 36.0–46.0)
HCT: 33 % — ABNORMAL LOW (ref 36.0–46.0)
HCT: 26 % — ABNORMAL LOW (ref 36.0–46.0)
HCT: 26 % — ABNORMAL LOW (ref 36.0–46.0)
HCT: 24 % — ABNORMAL LOW (ref 36.0–46.0)
Hemoglobin: 13.3 g/dL (ref 12.0–15.0)
Hemoglobin: 12.2 g/dL (ref 12.0–15.0)
Hemoglobin: 11.2 g/dL — ABNORMAL LOW (ref 12.0–15.0)
Hemoglobin: 8.8 g/dL — ABNORMAL LOW (ref 12.0–15.0)
Hemoglobin: 8.8 g/dL — ABNORMAL LOW (ref 12.0–15.0)
Hemoglobin: 8.2 g/dL — ABNORMAL LOW (ref 12.0–15.0)
Potassium: 4 mmol/L (ref 3.5–5.1)
Potassium: 4.2 mmol/L (ref 3.5–5.1)
Potassium: 3.8 mmol/L (ref 3.5–5.1)
Potassium: 4.4 mmol/L (ref 3.5–5.1)
Potassium: 4.6 mmol/L (ref 3.5–5.1)
Potassium: 4.3 mmol/L (ref 3.5–5.1)
Sodium: 133 mmol/L — ABNORMAL LOW (ref 135–145)
Sodium: 132 mmol/L — ABNORMAL LOW (ref 135–145)
Sodium: 133 mmol/L — ABNORMAL LOW (ref 135–145)
Sodium: 132 mmol/L — ABNORMAL LOW (ref 135–145)
Sodium: 134 mmol/L — ABNORMAL LOW (ref 135–145)
Sodium: 135 mmol/L (ref 135–145)

## 2018-12-02 LAB — POCT I-STAT 7, (LYTES, BLD GAS, ICA,H+H)
Acid-Base Excess: 7 mmol/L — ABNORMAL HIGH (ref 0.0–2.0)
Acid-Base Excess: 8 mmol/L — ABNORMAL HIGH (ref 0.0–2.0)
Acid-base deficit: 2 mmol/L (ref 0.0–2.0)
Bicarbonate: 30.4 mmol/L — ABNORMAL HIGH (ref 20.0–28.0)
Bicarbonate: 30.8 mmol/L — ABNORMAL HIGH (ref 20.0–28.0)
Bicarbonate: 25 mmol/L (ref 20.0–28.0)
Bicarbonate: 25.9 mmol/L (ref 20.0–28.0)
Bicarbonate: 25.8 mmol/L (ref 20.0–28.0)
Calcium, Ion: 0.99 mmol/L — ABNORMAL LOW (ref 1.15–1.40)
Calcium, Ion: 1.05 mmol/L — ABNORMAL LOW (ref 1.15–1.40)
Calcium, Ion: 1.22 mmol/L (ref 1.15–1.40)
Calcium, Ion: 1.22 mmol/L (ref 1.15–1.40)
Calcium, Ion: 1.26 mmol/L (ref 1.15–1.40)
HCT: 26 % — ABNORMAL LOW (ref 36.0–46.0)
HCT: 25 % — ABNORMAL LOW (ref 36.0–46.0)
HCT: 28 % — ABNORMAL LOW (ref 36.0–46.0)
HCT: 29 % — ABNORMAL LOW (ref 36.0–46.0)
HCT: 28 % — ABNORMAL LOW (ref 36.0–46.0)
Hemoglobin: 8.8 g/dL — ABNORMAL LOW (ref 12.0–15.0)
Hemoglobin: 8.5 g/dL — ABNORMAL LOW (ref 12.0–15.0)
Hemoglobin: 9.5 g/dL — ABNORMAL LOW (ref 12.0–15.0)
Hemoglobin: 9.9 g/dL — ABNORMAL LOW (ref 12.0–15.0)
Hemoglobin: 9.5 g/dL — ABNORMAL LOW (ref 12.0–15.0)
O2 Saturation: 100 %
O2 Saturation: 100 %
O2 Saturation: 95 %
O2 Saturation: 97 %
O2 Saturation: 95 %
Patient temperature: 35.2
Patient temperature: 36.6
Patient temperature: 36
Potassium: 3.5 mmol/L (ref 3.5–5.1)
Potassium: 5.2 mmol/L — ABNORMAL HIGH (ref 3.5–5.1)
Potassium: 4.7 mmol/L (ref 3.5–5.1)
Potassium: 4.8 mmol/L (ref 3.5–5.1)
Potassium: 5.1 mmol/L (ref 3.5–5.1)
Sodium: 133 mmol/L — ABNORMAL LOW (ref 135–145)
Sodium: 133 mmol/L — ABNORMAL LOW (ref 135–145)
Sodium: 136 mmol/L (ref 135–145)
Sodium: 136 mmol/L (ref 135–145)
Sodium: 135 mmol/L (ref 135–145)
TCO2: 31 mmol/L (ref 22–32)
TCO2: 32 mmol/L (ref 22–32)
TCO2: 26 mmol/L (ref 22–32)
TCO2: 27 mmol/L (ref 22–32)
TCO2: 28 mmol/L (ref 22–32)
pCO2 arterial: 37.6 mmHg (ref 32.0–48.0)
pCO2 arterial: 34.2 mmHg (ref 32.0–48.0)
pCO2 arterial: 37.4 mmHg (ref 32.0–48.0)
pCO2 arterial: 49.1 mmHg — ABNORMAL HIGH (ref 32.0–48.0)
pCO2 arterial: 56.3 mmHg — ABNORMAL HIGH (ref 32.0–48.0)
pH, Arterial: 7.515 — ABNORMAL HIGH (ref 7.350–7.450)
pH, Arterial: 7.563 — ABNORMAL HIGH (ref 7.350–7.450)
pH, Arterial: 7.426 (ref 7.350–7.450)
pH, Arterial: 7.329 — ABNORMAL LOW (ref 7.350–7.450)
pH, Arterial: 7.264 — ABNORMAL LOW (ref 7.350–7.450)
pO2, Arterial: 285 mmHg — ABNORMAL HIGH (ref 83.0–108.0)
pO2, Arterial: 296 mmHg — ABNORMAL HIGH (ref 83.0–108.0)
pO2, Arterial: 68 mmHg — ABNORMAL LOW (ref 83.0–108.0)
pO2, Arterial: 96 mmHg (ref 83.0–108.0)
pO2, Arterial: 86 mmHg (ref 83.0–108.0)

## 2018-12-02 LAB — CBC
HCT: 27.3 % — ABNORMAL LOW (ref 36.0–46.0)
HCT: 29 % — ABNORMAL LOW (ref 36.0–46.0)
Hemoglobin: 8.4 g/dL — ABNORMAL LOW (ref 12.0–15.0)
Hemoglobin: 9 g/dL — ABNORMAL LOW (ref 12.0–15.0)
MCH: 32.6 pg (ref 26.0–34.0)
MCH: 32.8 pg (ref 26.0–34.0)
MCHC: 30.8 g/dL (ref 30.0–36.0)
MCHC: 31 g/dL (ref 30.0–36.0)
MCV: 105.8 fL — ABNORMAL HIGH (ref 80.0–100.0)
MCV: 105.8 fL — ABNORMAL HIGH (ref 80.0–100.0)
Platelets: 102 10*3/uL — ABNORMAL LOW (ref 150–400)
Platelets: 157 10*3/uL (ref 150–400)
RBC: 2.58 MIL/uL — ABNORMAL LOW (ref 3.87–5.11)
RBC: 2.74 MIL/uL — ABNORMAL LOW (ref 3.87–5.11)
RDW: 18.2 % — ABNORMAL HIGH (ref 11.5–15.5)
RDW: 18.4 % — ABNORMAL HIGH (ref 11.5–15.5)
WBC: 5.5 10*3/uL (ref 4.0–10.5)
WBC: 12.4 10*3/uL — ABNORMAL HIGH (ref 4.0–10.5)
nRBC: 0.5 % — ABNORMAL HIGH (ref 0.0–0.2)
nRBC: 0.2 % (ref 0.0–0.2)

## 2018-12-02 LAB — BASIC METABOLIC PANEL
Anion gap: 12 (ref 5–15)
BUN: 45 mg/dL — ABNORMAL HIGH (ref 6–20)
CO2: 23 mmol/L (ref 22–32)
Calcium: 8.3 mg/dL — ABNORMAL LOW (ref 8.9–10.3)
Chloride: 101 mmol/L (ref 98–111)
Creatinine, Ser: 6.51 mg/dL — ABNORMAL HIGH (ref 0.44–1.00)
GFR calc Af Amer: 8 mL/min — ABNORMAL LOW (ref >60)
GFR calc non Af Amer: 7 mL/min — ABNORMAL LOW (ref >60)
Glucose, Bld: 89 mg/dL (ref 70–99)
Potassium: 5.4 mmol/L — ABNORMAL HIGH (ref 3.5–5.1)
Sodium: 136 mmol/L (ref 135–145)

## 2018-12-02 LAB — APTT: aPTT: 40 seconds — ABNORMAL HIGH (ref 24–36)

## 2018-12-02 LAB — GLUCOSE, CAPILLARY
Glucose-Capillary: 109 mg/dL — ABNORMAL HIGH (ref 70–99)
Glucose-Capillary: 87 mg/dL (ref 70–99)
Glucose-Capillary: 81 mg/dL (ref 70–99)
Glucose-Capillary: 101 mg/dL — ABNORMAL HIGH (ref 70–99)
Glucose-Capillary: 108 mg/dL — ABNORMAL HIGH (ref 70–99)
Glucose-Capillary: 78 mg/dL (ref 70–99)

## 2018-12-02 LAB — PLATELET COUNT: Platelets: 127 10*3/uL — ABNORMAL LOW (ref 150–400)

## 2018-12-02 LAB — HEMOGLOBIN AND HEMATOCRIT, BLOOD
HCT: 23.9 % — ABNORMAL LOW (ref 36.0–46.0)
Hemoglobin: 7.6 g/dL — ABNORMAL LOW (ref 12.0–15.0)

## 2018-12-02 LAB — MAGNESIUM: Magnesium: 2.7 mg/dL — ABNORMAL HIGH (ref 1.7–2.4)

## 2018-12-02 LAB — PROTIME-INR
INR: 1.5 — ABNORMAL HIGH (ref 0.8–1.2)
Prothrombin Time: 17.9 seconds — ABNORMAL HIGH (ref 11.4–15.2)

## 2018-12-02 SURGERY — REPLACEMENT, AORTIC VALVE, OPEN
Anesthesia: General | Site: Chest

## 2018-12-02 MED ORDER — VANCOMYCIN HCL IN DEXTROSE 1-5 GM/200ML-% IV SOLN
1000.0000 mg | Freq: Once | INTRAVENOUS | Status: AC
  Administered 2018-12-02: 1000 mg via INTRAVENOUS
  Filled 2018-12-02: qty 200

## 2018-12-02 MED ORDER — METOPROLOL TARTRATE 5 MG/5ML IV SOLN: 2.5000 mg | INTRAVENOUS | Status: DC | PRN

## 2018-12-02 MED ORDER — FAMOTIDINE IN NACL 20-0.9 MG/50ML-% IV SOLN
20.0000 mg | Freq: Two times a day (BID) | INTRAVENOUS | Status: AC
  Administered 2018-12-02 (×2): 20 mg via INTRAVENOUS
  Filled 2018-12-02: qty 50

## 2018-12-02 MED ORDER — LACTATED RINGERS IV SOLN
INTRAVENOUS | Status: DC | PRN
  Administered 2018-12-02 (×2): via INTRAVENOUS

## 2018-12-02 MED ORDER — LACTATED RINGERS IV SOLN
INTRAVENOUS | Status: DC | PRN
  Administered 2018-12-02: 07:00:00 via INTRAVENOUS

## 2018-12-02 MED ORDER — ALBUMIN HUMAN 5 % IV SOLN
INTRAVENOUS | Status: DC | PRN
  Administered 2018-12-02 (×2): via INTRAVENOUS

## 2018-12-02 MED ORDER — SODIUM CHLORIDE 0.9 % IV SOLN: 250.0000 mL | INTRAVENOUS | Status: DC

## 2018-12-02 MED ORDER — PANTOPRAZOLE SODIUM 40 MG PO TBEC: 40.0000 mg | DELAYED_RELEASE_TABLET | Freq: Every day | ORAL | Status: DC

## 2018-12-02 MED ORDER — HEMOSTATIC AGENTS (NO CHARGE) OPTIME: TOPICAL | Status: DC | PRN

## 2018-12-02 MED ORDER — MIDAZOLAM HCL (PF) 10 MG/2ML IJ SOLN
INTRAMUSCULAR | Status: AC
  Filled 2018-12-02: qty 2

## 2018-12-02 MED ORDER — SODIUM CHLORIDE 0.45 % IV SOLN
INTRAVENOUS | Status: DC | PRN
  Administered 2018-12-02: 14:00:00 via INTRAVENOUS

## 2018-12-02 MED ORDER — CHLORHEXIDINE GLUCONATE 0.12 % MT SOLN
15.0000 mL | Freq: Once | OROMUCOSAL | Status: AC
  Administered 2018-12-02: 15 mL via OROMUCOSAL
  Filled 2018-12-02: qty 15

## 2018-12-02 MED ORDER — BISACODYL 10 MG RE SUPP: 10.0000 mg | Freq: Every day | RECTAL | Status: DC

## 2018-12-02 MED ORDER — SODIUM CHLORIDE 0.9 % IV SOLN
INTRAVENOUS | Status: DC
  Administered 2018-12-02: 14:00:00 via INTRAVENOUS

## 2018-12-02 MED ORDER — SODIUM CHLORIDE 0.9% FLUSH
3.0000 mL | Freq: Two times a day (BID) | INTRAVENOUS | Status: DC
  Administered 2018-12-03 – 2018-12-10 (×15): 3 mL via INTRAVENOUS

## 2018-12-02 MED ORDER — ACETAMINOPHEN 650 MG RE SUPP
650.0000 mg | Freq: Once | RECTAL | Status: AC
  Administered 2018-12-02: 650 mg via RECTAL

## 2018-12-02 MED ORDER — PHENYLEPHRINE 40 MCG/ML (10ML) SYRINGE FOR IV PUSH (FOR BLOOD PRESSURE SUPPORT)
PREFILLED_SYRINGE | INTRAVENOUS | Status: AC
  Filled 2018-12-02: qty 10

## 2018-12-02 MED ORDER — INSULIN REGULAR(HUMAN) IN NACL 100-0.9 UT/100ML-% IV SOLN
INTRAVENOUS | Status: DC
  Administered 2018-12-02: 14:00:00 1.1 [IU]/h via INTRAVENOUS

## 2018-12-02 MED ORDER — CHLORHEXIDINE GLUCONATE 4 % EX LIQD: 30.0000 mL | CUTANEOUS | Status: DC

## 2018-12-02 MED ORDER — SODIUM CHLORIDE 0.9 % IV SOLN
INTRAVENOUS | Status: DC
  Administered 2018-12-02: 15:00:00 via INTRAVENOUS

## 2018-12-02 MED ORDER — NITROGLYCERIN IN D5W 200-5 MCG/ML-% IV SOLN
0.0000 ug/min | INTRAVENOUS | Status: DC
  Administered 2018-12-02: 15:00:00 10 ug/min via INTRAVENOUS

## 2018-12-02 MED ORDER — CLONIDINE HCL 0.2 MG/24HR TD PTWK
0.2000 mg | MEDICATED_PATCH | TRANSDERMAL | Status: DC
  Administered 2018-12-02: 0.2 mg via TRANSDERMAL
  Filled 2018-12-02: qty 1

## 2018-12-02 MED ORDER — CHLORHEXIDINE GLUCONATE 0.12 % MT SOLN
15.0000 mL | OROMUCOSAL | Status: AC
  Administered 2018-12-02: 15 mL via OROMUCOSAL

## 2018-12-02 MED ORDER — DOCUSATE SODIUM 100 MG PO CAPS
200.0000 mg | ORAL_CAPSULE | Freq: Every day | ORAL | Status: DC
  Administered 2018-12-03 – 2018-12-11 (×9): 200 mg via ORAL
  Filled 2018-12-02 (×9): qty 2

## 2018-12-02 MED ORDER — METOPROLOL TARTRATE 12.5 MG HALF TABLET
12.5000 mg | ORAL_TABLET | Freq: Once | ORAL | Status: DC
  Filled 2018-12-02: qty 1

## 2018-12-02 MED ORDER — PROTAMINE SULFATE 10 MG/ML IV SOLN
INTRAVENOUS | Status: DC | PRN
  Administered 2018-12-02: 20 mg via INTRAVENOUS
  Administered 2018-12-02: 160 mg via INTRAVENOUS

## 2018-12-02 MED ORDER — ORAL CARE MOUTH RINSE
15.0000 mL | Freq: Two times a day (BID) | OROMUCOSAL | Status: DC
  Administered 2018-12-02 – 2018-12-09 (×10): 15 mL via OROMUCOSAL

## 2018-12-02 MED ORDER — PLASMA-LYTE 148 IV SOLN
INTRAVENOUS | Status: DC | PRN
  Administered 2018-12-02: 09:00:00 50 mL via INTRAVASCULAR

## 2018-12-02 MED ORDER — ASPIRIN 81 MG PO CHEW: 324.0000 mg | CHEWABLE_TABLET | Freq: Every day | ORAL | Status: DC

## 2018-12-02 MED ORDER — VANCOMYCIN HCL 1000 MG IV SOLR
INTRAVENOUS | Status: DC | PRN
  Administered 2018-12-02: 1000 mL

## 2018-12-02 MED ORDER — SODIUM CHLORIDE 0.9 % IV SOLN
1.5000 g | Freq: Once | INTRAVENOUS | Status: AC
  Administered 2018-12-03: 21:00:00 1.5 g via INTRAVENOUS
  Filled 2018-12-02: qty 1.5

## 2018-12-02 MED ORDER — LABETALOL HCL 5 MG/ML IV SOLN
10.0000 mg | INTRAVENOUS | Status: DC | PRN
  Administered 2018-12-02 – 2018-12-06 (×6): 10 mg via INTRAVENOUS
  Filled 2018-12-02 (×5): qty 4

## 2018-12-02 MED ORDER — ROCURONIUM BROMIDE 10 MG/ML (PF) SYRINGE
PREFILLED_SYRINGE | INTRAVENOUS | Status: AC
  Filled 2018-12-02: qty 20

## 2018-12-02 MED ORDER — FENTANYL CITRATE (PF) 250 MCG/5ML IJ SOLN
INTRAMUSCULAR | Status: AC
  Filled 2018-12-02: qty 5

## 2018-12-02 MED ORDER — MIDAZOLAM HCL 5 MG/5ML IJ SOLN
INTRAMUSCULAR | Status: DC | PRN
  Administered 2018-12-02: 2 mg via INTRAVENOUS
  Administered 2018-12-02: 1 mg via INTRAVENOUS
  Administered 2018-12-02: 3 mg via INTRAVENOUS
  Administered 2018-12-02 (×3): 1 mg via INTRAVENOUS
  Administered 2018-12-02: 2 mg via INTRAVENOUS
  Administered 2018-12-02: 1 mg via INTRAVENOUS

## 2018-12-02 MED ORDER — SODIUM CHLORIDE 0.9 % IR SOLN
Status: DC | PRN
  Administered 2018-12-02: 3000 mL

## 2018-12-02 MED ORDER — MIDAZOLAM HCL 2 MG/2ML IJ SOLN: 2.0000 mg | INTRAMUSCULAR | Status: DC | PRN

## 2018-12-02 MED ORDER — ASPIRIN EC 325 MG PO TBEC: 325.0000 mg | DELAYED_RELEASE_TABLET | Freq: Every day | ORAL | Status: DC

## 2018-12-02 MED ORDER — PROPOFOL 10 MG/ML IV BOLUS
INTRAVENOUS | Status: DC | PRN
  Administered 2018-12-02 (×2): 100 mg via INTRAVENOUS

## 2018-12-02 MED ORDER — PROTAMINE SULFATE 10 MG/ML IV SOLN
INTRAVENOUS | Status: AC
  Filled 2018-12-02: qty 25

## 2018-12-02 MED ORDER — ACETAMINOPHEN 160 MG/5ML PO SOLN: 650.0000 mg | Freq: Once | ORAL | Status: AC

## 2018-12-02 MED ORDER — PROPOFOL 10 MG/ML IV BOLUS
INTRAVENOUS | Status: AC
  Filled 2018-12-02: qty 20

## 2018-12-02 MED ORDER — LACTATED RINGERS IV SOLN: INTRAVENOUS | Status: DC

## 2018-12-02 MED ORDER — INSULIN REGULAR BOLUS VIA INFUSION
0.0000 [IU] | Freq: Three times a day (TID) | INTRAVENOUS | Status: DC
  Filled 2018-12-02: qty 10

## 2018-12-02 MED ORDER — DEXMEDETOMIDINE HCL IN NACL 200 MCG/50ML IV SOLN
0.0000 ug/kg/h | INTRAVENOUS | Status: DC
  Administered 2018-12-02 (×2): 0.7 ug/kg/h via INTRAVENOUS

## 2018-12-02 MED ORDER — ROCURONIUM BROMIDE 10 MG/ML (PF) SYRINGE
PREFILLED_SYRINGE | INTRAVENOUS | Status: DC | PRN
  Administered 2018-12-02 (×3): 50 mg via INTRAVENOUS

## 2018-12-02 MED ORDER — MAGNESIUM SULFATE 4 GM/100ML IV SOLN: 4.0000 g | Freq: Once | INTRAVENOUS | Status: DC

## 2018-12-02 MED ORDER — HEPARIN SODIUM (PORCINE) 1000 UNIT/ML IJ SOLN
INTRAMUSCULAR | Status: AC
  Filled 2018-12-02: qty 1

## 2018-12-02 MED ORDER — 0.9 % SODIUM CHLORIDE (POUR BTL) OPTIME
TOPICAL | Status: DC | PRN
  Administered 2018-12-02: 1000 mL
  Administered 2018-12-02: 10:00:00 5000 mL

## 2018-12-02 MED ORDER — SODIUM CHLORIDE (PF) 0.9 % IJ SOLN
OROMUCOSAL | Status: DC | PRN
  Administered 2018-12-02 (×3): 4 mL via TOPICAL

## 2018-12-02 MED ORDER — HEPARIN SODIUM (PORCINE) 1000 UNIT/ML IJ SOLN
INTRAMUSCULAR | Status: DC | PRN
  Administered 2018-12-02: 19000 [IU] via INTRAVENOUS

## 2018-12-02 MED ORDER — PHENYLEPHRINE HCL-NACL 20-0.9 MG/250ML-% IV SOLN: 0.0000 ug/min | INTRAVENOUS | Status: DC

## 2018-12-02 MED ORDER — SODIUM CHLORIDE 0.9 % IV SOLN
1.5000 g | Freq: Two times a day (BID) | INTRAVENOUS | Status: DC
  Administered 2018-12-02: 1.5 g via INTRAVENOUS
  Filled 2018-12-02 (×2): qty 1.5

## 2018-12-02 MED ORDER — POTASSIUM CHLORIDE 10 MEQ/50ML IV SOLN: 10.0000 meq | INTRAVENOUS | Status: DC

## 2018-12-02 MED ORDER — ACETAMINOPHEN 500 MG PO TABS
1000.0000 mg | ORAL_TABLET | Freq: Four times a day (QID) | ORAL | Status: AC
  Administered 2018-12-02 – 2018-12-07 (×20): 1000 mg via ORAL
  Filled 2018-12-02 (×18): qty 2

## 2018-12-02 MED ORDER — FENTANYL CITRATE (PF) 250 MCG/5ML IJ SOLN
INTRAMUSCULAR | Status: AC
  Filled 2018-12-02: qty 25

## 2018-12-02 MED ORDER — INSULIN ASPART 100 UNIT/ML ~~LOC~~ SOLN: 0.0000 [IU] | SUBCUTANEOUS | Status: DC

## 2018-12-02 MED ORDER — METOPROLOL TARTRATE 5 MG/5ML IV SOLN
2.5000 mg | INTRAVENOUS | Status: DC | PRN
  Administered 2018-12-02: 5 mg via INTRAVENOUS
  Filled 2018-12-02: qty 5

## 2018-12-02 MED ORDER — TRAMADOL HCL 50 MG PO TABS
50.0000 mg | ORAL_TABLET | ORAL | Status: DC | PRN
  Administered 2018-12-04: 50 mg via ORAL
  Administered 2018-12-05 – 2018-12-10 (×8): 100 mg via ORAL
  Administered 2018-12-10 – 2018-12-11 (×2): 50 mg via ORAL
  Filled 2018-12-02 (×6): qty 2
  Filled 2018-12-02 (×3): qty 1
  Filled 2018-12-02: qty 2

## 2018-12-02 MED ORDER — LIDOCAINE 2% (20 MG/ML) 5 ML SYRINGE
INTRAMUSCULAR | Status: AC
  Filled 2018-12-02: qty 5

## 2018-12-02 MED ORDER — OXYCODONE HCL 5 MG PO TABS
5.0000 mg | ORAL_TABLET | ORAL | Status: DC | PRN
  Administered 2018-12-03: 10 mg via ORAL
  Administered 2018-12-03: 08:00:00 5 mg via ORAL
  Administered 2018-12-03: 16:00:00 10 mg via ORAL
  Administered 2018-12-04 (×4): 5 mg via ORAL
  Administered 2018-12-04 – 2018-12-05 (×3): 10 mg via ORAL
  Administered 2018-12-05 – 2018-12-06 (×5): 5 mg via ORAL
  Administered 2018-12-07 – 2018-12-09 (×6): 10 mg via ORAL
  Administered 2018-12-10: 5 mg via ORAL
  Administered 2018-12-10 (×2): 10 mg via ORAL
  Administered 2018-12-11: 5 mg via ORAL
  Filled 2018-12-02 (×2): qty 2
  Filled 2018-12-02 (×2): qty 1
  Filled 2018-12-02: qty 2
  Filled 2018-12-02 (×2): qty 1
  Filled 2018-12-02 (×2): qty 2
  Filled 2018-12-02: qty 1
  Filled 2018-12-02 (×4): qty 2
  Filled 2018-12-02 (×2): qty 1
  Filled 2018-12-02 (×2): qty 2
  Filled 2018-12-02 (×3): qty 1
  Filled 2018-12-02: qty 2
  Filled 2018-12-02: qty 1
  Filled 2018-12-02: qty 2
  Filled 2018-12-02: qty 1

## 2018-12-02 MED ORDER — ACETAMINOPHEN 160 MG/5ML PO SOLN: 1000.0000 mg | Freq: Four times a day (QID) | ORAL | Status: DC

## 2018-12-02 MED ORDER — ONDANSETRON HCL 4 MG/2ML IJ SOLN
4.0000 mg | Freq: Four times a day (QID) | INTRAMUSCULAR | Status: DC | PRN
  Administered 2018-12-02 – 2018-12-10 (×8): 4 mg via INTRAVENOUS
  Filled 2018-12-02 (×8): qty 2

## 2018-12-02 MED ORDER — HYDROCORTISONE NA SUCCINATE PF 100 MG IJ SOLR
50.0000 mg | Freq: Four times a day (QID) | INTRAMUSCULAR | Status: AC
  Administered 2018-12-02 – 2018-12-03 (×4): 50 mg via INTRAVENOUS
  Filled 2018-12-02 (×4): qty 2

## 2018-12-02 MED ORDER — LACTATED RINGERS IV SOLN: 500.0000 mL | Freq: Once | INTRAVENOUS | Status: DC | PRN

## 2018-12-02 MED ORDER — MORPHINE SULFATE (PF) 2 MG/ML IV SOLN
1.0000 mg | INTRAVENOUS | Status: DC | PRN
  Administered 2018-12-02 – 2018-12-05 (×7): 2 mg via INTRAVENOUS
  Filled 2018-12-02: qty 2
  Filled 2018-12-02 (×6): qty 1

## 2018-12-02 MED ORDER — SODIUM CHLORIDE 0.9% FLUSH: 3.0000 mL | INTRAVENOUS | Status: DC | PRN

## 2018-12-02 MED ORDER — ALBUMIN HUMAN 5 % IV SOLN: 250.0000 mL | INTRAVENOUS | Status: AC | PRN

## 2018-12-02 MED ORDER — SODIUM CHLORIDE 0.9 % IR SOLN: Status: DC | PRN

## 2018-12-02 MED ORDER — FENTANYL CITRATE (PF) 250 MCG/5ML IJ SOLN
INTRAMUSCULAR | Status: DC | PRN
  Administered 2018-12-02: 50 ug via INTRAVENOUS
  Administered 2018-12-02: 100 ug via INTRAVENOUS
  Administered 2018-12-02 (×6): 50 ug via INTRAVENOUS
  Administered 2018-12-02: 100 ug via INTRAVENOUS
  Administered 2018-12-02: 200 ug via INTRAVENOUS
  Administered 2018-12-02: 125 ug via INTRAVENOUS
  Administered 2018-12-02 (×4): 50 ug via INTRAVENOUS
  Administered 2018-12-02: 75 ug via INTRAVENOUS
  Administered 2018-12-02: 125 ug via INTRAVENOUS
  Administered 2018-12-02: 50 ug via INTRAVENOUS
  Administered 2018-12-02: 75 ug via INTRAVENOUS
  Administered 2018-12-02 (×3): 100 ug via INTRAVENOUS
  Administered 2018-12-02: 50 ug via INTRAVENOUS

## 2018-12-02 MED ORDER — MIDAZOLAM HCL 2 MG/2ML IJ SOLN
INTRAMUSCULAR | Status: AC
  Filled 2018-12-02: qty 2

## 2018-12-02 MED ORDER — BISACODYL 5 MG PO TBEC
10.0000 mg | DELAYED_RELEASE_TABLET | Freq: Every day | ORAL | Status: DC
  Administered 2018-12-03 – 2018-12-11 (×9): 10 mg via ORAL
  Filled 2018-12-02 (×9): qty 2

## 2018-12-02 SURGICAL SUPPLY — 131 items
ADAPTER CARDIO PERF ANTE/RETRO (ADAPTER) ×5 IMPLANT
ADPR PRFSN 84XANTGRD RTRGD (ADAPTER) ×2
APL SWBSTK 6 STRL LF DISP (MISCELLANEOUS) ×2
APPLICATOR COTTON TIP 6 STRL (MISCELLANEOUS) IMPLANT
APPLICATOR COTTON TIP 6IN STRL (MISCELLANEOUS) ×3
ATTRACTOMAT 16X20 MAGNETIC DRP (DRAPES) ×2 IMPLANT
BAG DECANTER FOR FLEXI CONT (MISCELLANEOUS) ×4 IMPLANT
BLADE STERNUM SYSTEM 6 (BLADE) ×5 IMPLANT
BLADE SURG 11 STRL SS (BLADE) ×6 IMPLANT
BOOT SUTURE AID YELLOW STND (SUTURE) ×1 IMPLANT
CABLE PACING FASLOC BIEGE (MISCELLANEOUS) IMPLANT
CANISTER SUCT 3000ML PPV (MISCELLANEOUS) ×5 IMPLANT
CANN PRFSN 3/8X14X24FR PCFC (MISCELLANEOUS)
CANN PRFSN 3/8XCNCT ST RT ANG (MISCELLANEOUS)
CANNULA AORTIC ROOT 9FR (CANNULA) ×1 IMPLANT
CANNULA EZ GLIDE AORTIC 21FR (CANNULA) ×5 IMPLANT
CANNULA FEM VENOUS REMOTE 22FR (CANNULA) ×1 IMPLANT
CANNULA GUNDRY RCSP 15FR (MISCELLANEOUS) ×3 IMPLANT
CANNULA PRFSN 3/8X14X24FR PCFC (MISCELLANEOUS) IMPLANT
CANNULA PRFSN 3/8XCNCT RT ANG (MISCELLANEOUS) IMPLANT
CANNULA SOFTFLOW AORTIC 7M21FR (CANNULA) ×3 IMPLANT
CANNULA SUMP PERICARDIAL (CANNULA) ×3 IMPLANT
CANNULA VEN MTL TIP RT (MISCELLANEOUS)
CANNULA VENNOUS METAL TIP 20FR (CANNULA) ×1 IMPLANT
CATH CPB KIT OWEN (MISCELLANEOUS) ×2 IMPLANT
CATH HEART VENT LEFT (CATHETERS) ×2 IMPLANT
CATH THORACIC 28FR RT ANG (CATHETERS) IMPLANT
CATH THORACIC 36FR (CATHETERS) ×1 IMPLANT
CATH THORACIC 36FR RT ANG (CATHETERS) ×2 IMPLANT
CLIP FOGARTY SPRING 6M (CLIP) IMPLANT
CONN 1/2X1/2X1/2  BEN (MISCELLANEOUS) ×1
CONN 1/2X1/2X1/2 BEN (MISCELLANEOUS) ×2 IMPLANT
CONN 3/8X1/2 ST GISH (MISCELLANEOUS) ×4 IMPLANT
CONN ST 1/4X3/8  BEN (MISCELLANEOUS) ×2
CONN ST 1/4X3/8 BEN (MISCELLANEOUS) IMPLANT
CONT SPEC 4OZ CLIKSEAL STRL BL (MISCELLANEOUS) ×2 IMPLANT
COVER PROBE W GEL 5X96 (DRAPES) ×1 IMPLANT
COVER SURGICAL LIGHT HANDLE (MISCELLANEOUS) ×4 IMPLANT
COVER WAND RF STERILE (DRAPES) ×4 IMPLANT
DEVICE CLOSURE PERCLS PRGLD 6F (VASCULAR PRODUCTS) IMPLANT
DEVICE SUT CK QUICK LOAD INDV (Prosthesis & Implant Heart) ×4 IMPLANT
DEVICE SUT CK QUICK LOAD MINI (Prosthesis & Implant Heart) ×5 IMPLANT
DRAIN CHANNEL 32F RND 10.7 FF (WOUND CARE) ×7 IMPLANT
DRAPE BILATERAL SPLIT (DRAPES) ×1 IMPLANT
DRAPE CARDIOVASCULAR INCISE (DRAPES)
DRAPE CV SPLIT W-CLR ANES SCRN (DRAPES) ×1 IMPLANT
DRAPE INCISE IOBAN 66X45 STRL (DRAPES) ×7 IMPLANT
DRAPE SLUSH/WARMER DISC (DRAPES) ×5 IMPLANT
DRAPE SRG 135X102X78XABS (DRAPES) IMPLANT
DRSG AQUACEL AG ADV 3.5X14 (GAUZE/BANDAGES/DRESSINGS) ×1 IMPLANT
DRSG COVADERM 4X14 (GAUZE/BANDAGES/DRESSINGS) ×4 IMPLANT
ELECT REM PT RETURN 9FT ADLT (ELECTROSURGICAL) ×6
ELECTRODE REM PT RTRN 9FT ADLT (ELECTROSURGICAL) ×8 IMPLANT
FELT TEFLON 1X6 (MISCELLANEOUS) ×9 IMPLANT
GAUZE SPONGE 4X4 12PLY STRL (GAUZE/BANDAGES/DRESSINGS) ×10 IMPLANT
GLOVE BIO SURGEON STRL SZ 6 (GLOVE) ×3 IMPLANT
GLOVE BIO SURGEON STRL SZ 6.5 (GLOVE) ×2 IMPLANT
GLOVE BIO SURGEON STRL SZ7 (GLOVE) IMPLANT
GLOVE BIO SURGEON STRL SZ7.5 (GLOVE) IMPLANT
GLOVE ORTHO TXT STRL SZ7.5 (GLOVE) ×9 IMPLANT
GOWN STRL REUS W/ TWL LRG LVL3 (GOWN DISPOSABLE) ×16 IMPLANT
GOWN STRL REUS W/TWL LRG LVL3 (GOWN DISPOSABLE) ×30
HEMOSTAT POWDER SURGIFOAM 1G (HEMOSTASIS) ×13 IMPLANT
INSERT FOGARTY XLG (MISCELLANEOUS) ×5 IMPLANT
IV NS IRRIG 3000ML ARTHROMATIC (IV SOLUTION) ×1 IMPLANT
KIT BASIN OR (CUSTOM PROCEDURE TRAY) ×5 IMPLANT
KIT DILATOR VASC 18G NDL (KITS) ×1 IMPLANT
KIT DRAINAGE VACCUM ASSIST (KITS) ×1 IMPLANT
KIT SUCTION CATH 14FR (SUCTIONS) ×17 IMPLANT
KIT SUT CK MINI COMBO 4X17 (Prosthesis & Implant Heart) ×1 IMPLANT
KIT TURNOVER KIT B (KITS) ×5 IMPLANT
LEAD PACING MYOCARDI (MISCELLANEOUS) ×3 IMPLANT
LINE VENT (MISCELLANEOUS) ×1 IMPLANT
LOOP VESSEL SUPERMAXI WHITE (MISCELLANEOUS) ×3 IMPLANT
NS IRRIG 1000ML POUR BTL (IV SOLUTION) ×23 IMPLANT
PACK E OPEN HEART (SUTURE) ×3 IMPLANT
PACK OPEN HEART (CUSTOM PROCEDURE TRAY) ×5 IMPLANT
PAD ARMBOARD 7.5X6 YLW CONV (MISCELLANEOUS) ×10 IMPLANT
PERCLOSE PROGLIDE 6F (VASCULAR PRODUCTS) ×6
POSITIONER HEAD DONUT 9IN (MISCELLANEOUS) ×5 IMPLANT
SET CARDIOPLEGIA MPS 5001102 (MISCELLANEOUS) ×1 IMPLANT
SET IRRIG TUBING LAPAROSCOPIC (IRRIGATION / IRRIGATOR) ×5 IMPLANT
SHEATH PINNACLE 8F 10CM (SHEATH) ×1 IMPLANT
SPONGE LAP 4X18 RFD (DISPOSABLE) ×1 IMPLANT
SUT BONE WAX W31G (SUTURE) ×5 IMPLANT
SUT ETHIBON 2 0 V 52N 30 (SUTURE) ×6 IMPLANT
SUT ETHIBON EXCEL 2-0 V-5 (SUTURE) ×10 IMPLANT
SUT ETHIBOND 2 0 SH (SUTURE) ×2 IMPLANT
SUT ETHIBOND 2 0 SH 36X2 (SUTURE) ×4 IMPLANT
SUT ETHIBOND 2 0 V4 (SUTURE) IMPLANT
SUT ETHIBOND 2 0V4 GREEN (SUTURE) IMPLANT
SUT ETHIBOND 4 0 RB 1 (SUTURE) IMPLANT
SUT ETHIBOND 4 0 TF (SUTURE) IMPLANT
SUT ETHIBOND 5 0 C 1 30 (SUTURE) IMPLANT
SUT ETHIBOND V-5 VALVE (SUTURE) ×10 IMPLANT
SUT ETHIBOND X763 2 0 SH 1 (SUTURE) ×12 IMPLANT
SUT MNCRL AB 3-0 PS2 18 (SUTURE) ×10 IMPLANT
SUT PDS AB 1 CTX 36 (SUTURE) ×10 IMPLANT
SUT PROLENE 3 0 SH 1 (SUTURE) ×3 IMPLANT
SUT PROLENE 3 0 SH1 36 (SUTURE) ×1 IMPLANT
SUT PROLENE 4 0 RB 1 (SUTURE) ×12
SUT PROLENE 4 0 SH DA (SUTURE) ×7 IMPLANT
SUT PROLENE 4-0 RB1 .5 CRCL 36 (SUTURE) ×8 IMPLANT
SUT PROLENE 5 0 C 1 36 (SUTURE) IMPLANT
SUT PROLENE 6 0 C 1 30 (SUTURE) IMPLANT
SUT SILK  1 MH (SUTURE) ×3
SUT SILK 1 MH (SUTURE) ×4 IMPLANT
SUT SILK 2 0 SH CR/8 (SUTURE) IMPLANT
SUT SILK 3 0 SH CR/8 (SUTURE) IMPLANT
SUT STEEL 6MS V (SUTURE) IMPLANT
SUT STEEL STERNAL CCS#1 18IN (SUTURE) ×1 IMPLANT
SUT STEEL SZ 6 DBL 3X14 BALL (SUTURE) ×2 IMPLANT
SUT VIC AB 1 CTX 36 (SUTURE) ×3
SUT VIC AB 1 CTX36XBRD ANBCTR (SUTURE) IMPLANT
SUT VIC AB 2-0 CTX 27 (SUTURE) IMPLANT
SYSTEM SAHARA CHEST DRAIN ATS (WOUND CARE) ×5 IMPLANT
SYSTEM SAHARA CHEST DRAIN RE-I (WOUND CARE) ×1 IMPLANT
TAPE CLOTH SOFT 2X10 (GAUZE/BANDAGES/DRESSINGS) ×1 IMPLANT
TAPE CLOTH SURG 4X10 WHT LF (GAUZE/BANDAGES/DRESSINGS) ×1 IMPLANT
TOWEL GREEN STERILE (TOWEL DISPOSABLE) ×4 IMPLANT
TOWEL GREEN STERILE FF (TOWEL DISPOSABLE) ×5 IMPLANT
TRAY FOLEY SLVR 16FR TEMP STAT (SET/KITS/TRAYS/PACK) ×5 IMPLANT
TUBE SUCT INTRACARD DLP 20F (MISCELLANEOUS) ×1 IMPLANT
UNDERPAD 30X30 (UNDERPADS AND DIAPERS) ×5 IMPLANT
VALVE AORTIC TOP HAT (Prosthesis & Implant Heart) ×3 IMPLANT
VALVE AORTIC TOP HAT 21 (Prosthesis & Implant Heart) IMPLANT
VALVE MITRAL 31MM (Prosthesis & Implant Heart) ×1 IMPLANT
VENT LEFT HEART 12002 (CATHETERS) ×3
WATER STERILE IRR 1000ML POUR (IV SOLUTION) ×10 IMPLANT
WIRE EMERALD 3MM-J .035X150CM (WIRE) ×1 IMPLANT
YANKAUER SUCT BULB TIP NO VENT (SUCTIONS) ×1 IMPLANT

## 2018-12-02 NOTE — Brief Op Note (Signed)
12/02/2018  1:35 PM  PATIENT:  [name]  58 y.o. female  PRE-OPERATIVE DIAGNOSIS:  Aortic stenosis Mitral stenosis  POST-OPERATIVE DIAGNOSIS:  Aortic stenosis Mitral stenosis  PROCEDURE:  Procedure(s): AORTIC VALVE REPLACEMENT (AVR) USING CARBOMEDICS SUPRA-ANNULAR TOP HAT SIZE 21MM (N/A) MITRAL VALVE (MV) REPLACEMENT USING CARBOMEDICS OPTIFORM SIZE 31MM (N/A) TRANSESOPHAGEAL ECHOCARDIOGRAM (TEE) (N/A)  SURGEON:  Surgeon(s) and Role:    Rexene Alberts, MD - Primary    * Wonda Olds, MD - Assisting  ANESTHESIA:   general  EBL:  710 mL  BLOOD ADMINISTERED:none  DRAINS: 4 Chest Tube(s) in the mediastinum and bilateral pleural spaces   LOCAL MEDICATIONS USED:  NONE  SPECIMEN:  Source of Specimen:  portions of aortic and mitral valves  DISPOSITION OF SPECIMEN:  PATHOLOGY  COUNTS:  YES  TOURNIQUET:  * No tourniquets in log *  DICTATION: .Note written in EPIC  PLAN OF CARE: Admit to inpatient   PATIENT DISPOSITION:  ICU - intubated and hemodynamically stable.   Delay start of Pharmacological VTE agent (>24hrs) due to surgical blood loss or risk of bleeding: yes  Rexene Alberts, MD 12/02/2018 1:37 PM

## 2018-12-02 NOTE — Op Note (Signed)
CARDIOTHORACIC SURGERY OPERATIVE NOTE  Date of Procedure:  12/02/2018  Preoperative Diagnosis:   Rheumatic Heart Disease  Moderate Aortic Stenosis  Severe Mitral Stenosis   Postoperative Diagnosis: Same   Procedure:    Aortic Valve Replacement  [employer] Top-Hat bileaflet mechanical valve (size 21 mm, cat # [id number], serial # [id number])   Mitral Valve Replacement  [employer] Optiform bileaflet mechanical valve (size 31 mm, cat # [id number], serial # [id number])   Surgeon: Valentina Gu. Roxy Manns, MD  Assistant: B. Murvin Natal, MD  Anesthesia: Albertha Ghee, MD  Operative Findings:  Rheumatic heart disease  Moderate aortic stenosis  Severe mitral stenosis  Normal left ventricular systolic function  Moderate left ventricular hypertrophy  Normal right ventricular function  Moderate pulmonary hypertension            BRIEF CLINICAL NOTE AND INDICATIONS FOR SURGERY  Patient is a 58 year old African-American female with history of aortic stenosis, mitral stenosis, end-stage renal disease on hemodialysis, hypertension, lupus, remote history of stroke, dysfunctional uterine bleeding, peptic ulcer disease, and chronic anemia who has been referred for surgical consultation to discuss treatment options for management of aortic stenosis and mitral stenosis.  Patient's cardiac history dates back to 2016 when she was hospitalized with acute hypoxic respiratory failure and flash pulmonary edema in the setting of influenza A pneumonia and hypertensive urgency. She was treated with dialysis and Tamiflu and recovered. Cardiac enzymes were elevated at that time. Echocardiogram revealed normal left ventricular function with moderate mitral stenosis and mild aortic stenosis. She has been followed intermittently ever since by Dr. Harrington Challenger. She has had numerous hospitalizations over the last several years for a variety of problems. June 2019 she was hospitalized with  respiratory distress and volume overload. Apparently she was in rapid atrial fibrillation for a brief period of time. Transesophageal echocardiogram performed January 02, 2018 revealed normal left ventricular systolic function with moderate aortic stenosis. Findings were suggestive of possible bicuspid aortic valve. There was felt to be "at least moderate mitral stenosis".Mean transvalvular gradient across the mitral valve was estimated 16 mmHg with mitral valve area by pressure half-time calculated 2.8 cm. By planimetry the valve area measured 1.5 cm. There was mild to moderate mitral regurgitation and heavy calcification of the mitral valve with severely restricted leaflet mobility. She was treated medically and seen mostrecently in follow-up by Dr. Harrington Challenger on July 03, 2018. Follow-up echocardiogram performed at that time revealed increased transvalvular gradients across both the mitral and aortic valves and findings suggestive of high filling pressures. Peak transvalvular gradient across the aortic valve measured 4.1 m/s corresponding to mean transvalvular gradient estimated 33 mmHg and aortic valve area calculated 1.1 cm. The DVI was relatively high and reported 0.37. Mean transvalvular gradient across the mitral valve was reported 21.7 mmHg but the mitral valve area calculated based on pressure half-time was reported 3.91 cm. Left ventricular systolic function remain normal with ejection fraction estimated 60 to 65%. The patient subsequently underwent left and right heart catheterization on July 24, 2018. She is found to have normal coronary artery anatomy with no significant coronary artery disease. 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 There was mildly elevated pulmonary capillary wedge pressure (19 mmHg) and highnormal cardiac output (6.15 L/min). There was severe mitral stenosis with mean transvalvular  gradient estimated 22 mmHg. There was moderate aortic stenosis with mean transvalvular gradients measured 10 mmHg using simultaneous waveforms from the femoral artery to the left ventricle and  24 mmHg on pullback gradient. Cardiothoracic surgical consultation was requested.  The patient has been seen in consultation and counseled at length regarding the indications, risks and potential benefits of surgery.  All questions have been answered, and the patient provides full informed consent for the operation as described.    DETAILS OF THE OPERATIVE PROCEDURE  Preparation:  The patient is brought to the operating room on the above mentioned date and central monitoring was established by the anesthesia team including placement of Swan-Ganz catheter and radial arterial line.  There was moderate pulmonary hypertension at baseline.  The patient is placed in the supine position on the operating table.  Intravenous antibiotics are administered. General endotracheal anesthesia is induced uneventfully. A Foley catheter is placed.  Baseline transesophageal echocardiogram was performed.  Findings were notable for classical rheumatic heart disease with severe mitral stenosis and moderate aortic stenosis.  There was normal left ventricular systolic function with moderate left ventricular hypertrophy.  Right ventricular size and systolic function appeared normal.  There was trivial tricuspid regurgitation.  The patient's chest, abdomen, both groins, and both lower extremities are prepared and draped in a sterile manner. A time out procedure is performed.   Percutaneous Vascular Access:  Percutaneous venous access was obtained on the right side.  Using ultrasound guidance the right common femoral vein was cannulated using the Seldinger technique and single Perclose vascular closure devise was placed, after which time an 8 French sheath inserted.      Surgical Approach:  A median sternotomy incision was  performed and the pericardium is opened. The ascending aorta is normal in appearance.    Extracorporeal Cardiopulmonary Bypass and Myocardial Protection:  The patient was heparinized systemically.  The ascending aorta is cannulated for cardiopulmonary bypass.  The right common femoral vein is cannulated through the venous sheath and a guidewire advanced into the right atrium using TEE guidance.  The femoral vein cannulated using a 22 Fr long femoral venous cannula.  Adequate heparinization is verified.  A retrograde cardioplegia cannula is placed through the right atrium into the coronary sinus.  The operative field was continuously flooded with carbon dioxide gas.  The entire pre-bypass portion of the operation was notable for stable hemodynamics.  Cardiopulmonary bypass was begun and the surface of the heart is inspected.  A second venous cannula is placed directly into the superior vena cava.  A cardioplegia cannula is placed in the ascending aorta.    The patient is cooled to 32C systemic temperature.  The aortic cross clamp is applied and cardioplegia is delivered initially in an antegrade fashion through the aortic root using modified [place] cold blood cardioplegia ([place] blood cardioplegia protocol).   The initial cardioplegic arrest is rapid with early diastolic arrest.  Repeat doses of cardioplegia are administered at 90 minutes and every 30 minutes thereafter through the coronary sinus catheter in order to maintain completely flat electrocardiogram.  Myocardial protection was felt to be excellent.   Mitral Valve Replacement:  A left atriotomy incision was performed posteriorly through the intra-atrial groove.  The mitral valve is exposed using a self-retaining retractor.  Exposure was felt to be excellent.  The mitral valve is examined.  There is severe rheumatic mitral stenosis.  Both the anterior and posterior leaflets are severely thickened and calcified.  There is fusion of both  commissures with a classical fishmouth appearance of the valve.  There is severe thickening, foreshortening, and calcification of the subvalvular apparatus.  There is extreme calcification in the  posterior annulus and moderate to severe calcification around the anterior annulus.  The entire anterior leaflet of the mitral valve is excised sharply.  Preservation of a portion of the subvalvular apparatus from the anterior leaflet is not feasible because of dense calcification and foreshortening of all of the subvalvular apparatus.  Removal of the anterior leaflet is somewhat tedious due to heavy calcification.  The majority of the posterior leaflet is decalcified but a portion of the posterior leaflet and subvalvular apparatus is preserved.  Extensive decalcification along the posterior annulus is necessary.  The entire left atrium and left ventricle is irrigated with copious saline solution to evacuate any particular debris.  The mitral valve annulus is sized to accept a 31 mm bileaflet mechanical prosthetic valve.  Mitral valve replacement was performed using interrupted horizontal mattress 2-0 Ethibond pledgeted sutures with pledgets in the supraannular position.  A [employer] Optiform bileaflet mechanical valve (size 31 mm, cat # [id number], serial # [id number]) was implanted uneventfully. All sutures were secured using a Cor-knot device.    After the valve is seated it is carefully examined to make sure that both leaflets open and close normally without impingement on any of the subvalvular apparatus.  A sump drain is placed across the mitral valve to serve as a left ventricular vent.  The left atriotomy incision is closed posteriorly with a 2 layer closure.   Aortic Valve Replacement:  An oblique transverse aortotomy incision was performed.  The aortic valve was inspected and notable for classical rheumatic disease with moderate aortic stenosis.  The aortic valve leaflets were excised sharply and  the aortic annulus decalcified.  Decalcification was notably moderate.  The aortic annulus was sized to accept a 21 mm supra-annular prosthesis.  The aortic root and left ventricle were irrigated with copious cold saline solution.  Aortic valve replacement was performed using interrupted horizontal mattress 2-0 Ethibond pledgeted sutures with pledgets in the subannular position.  A [employer]in [employer] bileaflet supra-annular mechanical valve (size 21 mm, cat # [id number], serial # [id number]) was implanted uneventfully. All sutures were secured using a Cor-knot device.  The valve seated appropriately with adequate space beneath the left main and right coronary artery.   Procedure Completion:  The aortotomy was closed using a 2-layer closure of running 4-0 Prolene suture.  One final dose of warm retrograde "reanimation dose" cardioplegia was administered retrograde through the coronary sinus catheter while all air was evacuated through the aortic root.  The aortic cross clamp was removed after a total cross clamp time of 142 minutes.  Epicardial pacing wires are fixed to the right ventricular outflow tract and to the right atrial appendage. The patient is rewarmed to 37C temperature. The aortic and left ventricular vents are removed.  The superior vena cava cannula is removed.  The patient is weaned and disconnected from cardiopulmonary bypass.  The patient's rhythm at separation from bypass was sinus.  The patient was weaned from cardiopulmonary bypass without any inotropic support. Total cardiopulmonary bypass time for the operation was 164 minutes.  Followup transesophageal echocardiogram performed after separation from bypass revealed bileaflet mechanical prosthetic valves in both the aortic and mitral positions.  Both valves are functioning normally.  There is no evidence for paravalvular leak.  Left ventricular function was unchanged from preoperatively.  The aortic cannula was removed  uneventfully. Protamine was administered to reverse the anticoagulation.  The femoral venous cannula was removed and the Perclose suture secured.  Manual pressure was held on the groin  for 30 minutes.  The mediastinum and pleural spaces were inspected for hemostasis and irrigated with saline solution. The mediastinum and both pleural spaces were drained using 4 chest tubes placed through separate stab incisions inferiorly.  The soft tissues anterior to the aorta were reapproximated loosely. The sternum is closed with double strength sternal wire. The soft tissues anterior to the sternum were closed in multiple layers and the skin is closed with a running subcuticular skin closure.  The post-bypass portion of the operation was notable for stable rhythm and hemodynamics.  No blood products were administered during the operation.   Disposition:  The patient tolerated the procedure well and is transported to the surgical intensive care in stable condition. There are no intraoperative complications. All sponge instrument and needle counts are verified correct at completion of the operation.    Valentina Gu. Roxy Manns MD 12/02/2018 1:41 PM

## 2018-12-02 NOTE — Progress Notes (Signed)
Spoke with pt's mother, Stanton Kidney, & updated her on pt's status. Stanton Kidney said she will call back in the morning for an update. She is very appreciative of all we have done for her daughter.

## 2018-12-02 NOTE — Anesthesia Procedure Notes (Signed)
Central Venous Catheter Insertion Performed by: Roberts Gaudy, MD, anesthesiologist Start/End7/14/2020 6:30 AM, 12/02/2018 6:40 AM Patient location: Pre-op. Preanesthetic checklist: patient identified, IV checked, site marked, risks and benefits discussed, surgical consent, monitors and equipment checked, pre-op evaluation, timeout performed and anesthesia consent Hand hygiene performed  and maximum sterile barriers used  PA cath was placed.Swan type:thermodilution Procedure performed without using ultrasound guided technique. Ultrasound Notes:anatomy identified, needle tip was noted to be adjacent to the nerve/plexus identified, no ultrasound evidence of intravascular and/or intraneural injection and image(s) printed for medical record Attempts: 1 Following insertion, line sutured, dressing applied and Biopatch. Post procedure assessment: blood return through all ports and free fluid flow  Patient tolerated the procedure well with no immediate complications.

## 2018-12-02 NOTE — Procedures (Signed)
Extubation Procedure Note  Patient Details:   Name: [name] DOB: May 27, 1960 MRN: [id number]   Airway Documentation:    Vent end date: 12/02/18 Vent end time: 1742   Evaluation  O2 sats: stable throughout Complications: No apparent complications Patient did tolerate procedure well. Bilateral Breath Sounds: Clear, Diminished   Yes   Patient extubated to [name]. Vital signs stable at this time. No complications. RT will continue to monitor.  Mcneil Sober 12/02/2018, 5:43 PM

## 2018-12-02 NOTE — Progress Notes (Signed)
[facility] BRIEF SICU PROGRESS NOTE  Day of Surgery  S/P Procedure(s) (LRB): AORTIC VALVE REPLACEMENT (AVR) USING CARBOMEDICS SUPRA-ANNULAR TOP HAT SIZE 21MM (N/A) MITRAL VALVE (MV) REPLACEMENT USING CARBOMEDICS OPTIFORM SIZE 31MM (N/A) TRANSESOPHAGEAL ECHOCARDIOGRAM (TEE) (N/A)   Extubated uneventfully Alert and breathing comfortably, neuro intact NSR w/ stable hemodynamics O2 sats 99-100% Chest tube output low Labs okay  Plan: Continue routine early postop  Rexene Alberts, MD 12/02/2018 6:26 PM

## 2018-12-02 NOTE — Interval H&P Note (Signed)
History and Physical Interval Note:  12/02/2018 5:45 AM  [name]  has presented today for surgery, with the diagnosis of AS MS.  The various methods of treatment have been discussed with the patient and family. After consideration of risks, benefits and other options for treatment, the patient has consented to  Procedure(s): AORTIC VALVE REPLACEMENT (AVR) (N/A) MITRAL VALVE (MV) REPLACEMENT (N/A) TRANSESOPHAGEAL ECHOCARDIOGRAM (TEE) (N/A) as a surgical intervention.  The patient's history has been reviewed, patient examined, no change in status, stable for surgery.  I have reviewed the patient's chart and labs.  Questions were answered to the patient's satisfaction.     [name]

## 2018-12-02 NOTE — Anesthesia Procedure Notes (Signed)
Central Venous Catheter Insertion Performed by: Roberts Gaudy, MD, anesthesiologist Start/End7/14/2020 6:30 AM, 12/02/2018 6:40 AM Patient location: Pre-op. Preanesthetic checklist: patient identified, IV checked, site marked, risks and benefits discussed, surgical consent, monitors and equipment checked, pre-op evaluation, timeout performed and anesthesia consent Lidocaine 1% used for infiltration and patient sedated Hand hygiene performed  and maximum sterile barriers used  Catheter size: 9 Fr Sheath introducer Procedure performed using ultrasound guided technique. Ultrasound Notes:anatomy identified, needle tip was noted to be adjacent to the nerve/plexus identified, no ultrasound evidence of intravascular and/or intraneural injection and image(s) printed for medical record Attempts: 1 Following insertion, line sutured and dressing applied. Post procedure assessment: blood return through all ports, free fluid flow and no air  Patient tolerated the procedure well with no immediate complications.

## 2018-12-02 NOTE — Progress Notes (Signed)
PHARMACY NOTE:  ANTIMICROBIAL RENAL DOSAGE ADJUSTMENT  Current antimicrobial regimen includes a mismatch between antimicrobial dosage and estimated renal function.  As per policy approved by the Pharmacy & Therapeutics and Medical Executive Committees, the antimicrobial dosage will be adjusted accordingly.  Current antimicrobial dosage:  Zinacef 1.5g IV every 12 hours  Indication: Post-op AVR/MVR  Renal Function:  Estimated Creatinine Clearance: 9 mL/min (A) (by C-G formula based on SCr of 6.51 mg/dL (H)). [x]      On intermittent HD, scheduled: []      On CRRT    Antimicrobial dosage has been changed to: Zinacef 1.5 g x 1 dose on 7/15 evening  Additional comments:  The patient is noted to be ESRD-MWF however renal is waiting to evaluate the patient tomorrow to determine if hemodialysis is needed. Will adjust the post-op dose to be on 7/15 evening x 1 in case the patient proceeds with HD tomorrow - it will provide sufficient coverage.  Thank you for allowing pharmacy to be a part of this patient's care.  Alycia Rossetti, PharmD, BCPS Pager: [phone] 9:04 PM

## 2018-12-02 NOTE — Consult Note (Addendum)
[facility] Renal Consultation Note    Indication for Consultation:  Management of ESRD/hemodialysis; anemia, hypertension/volume and secondary hyperparathyroidism  OIN:OMVE-HMCNO, Iona Beard, MD  HPI: [name] is a 58 y.o. female. 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    Patient has been admitted for double mechanical valve replacement of the aortic and mitral valve to repair severe disease.   Patient seen and examined at bed.  Unable to obtain ROS due to patient being intubated and sedated. Last dialysis completed yesterday per her regular schedule.  Patient is mostly compliant with prescribed dialysis regimen only occasionally signing off early. Seen in CVICU-  Multiple drips and sedation    Past Medical History:  Diagnosis Date  . Anemia   . Aortic stenosis 09/25/2016   Echo 07/24/16: Mod conc LVH, EF 60-65, no RWMA, Gr 2 DDd, bicuspid aortic valve, mild to mod AS (mean 18, peak 38), MAC, mod mitral stenosis (mean 9, peak 19), mild to mod MR, severe LAE, normal RVSF, mild RAE, mild TR  . Arthritis    "joints" (10/23/2017)  . Blood transfusion '08   [facility]; "low HgB" (10/23/2017)  . Chronic diastolic (congestive) heart failure ([place])   . Chronic diastolic CHF (congestive heart failure) ([place])   . Claustrophobia   . Dysfunctional uterine bleeding 12/19/2010  . ESRD (end stage renal disease) on dialysis [facility])    "MWF; [name]." (10/23/2017)  . GERD (gastroesophageal reflux disease)   . Headache   . Heart murmur   . Hemodialysis patient [facility])    right extremity port  . History of hiatal hernia   . Hx of cardiovascular stress test    Lexiscan Myoview 4/16:   Normal stress nuclear study, EF 59%  . Hypertension   . Lupus ([place])    "? kind" (10/23/2017)  . Mitral stenosis    Echo 4/16:  EF 55-60%, no RWMA, Gr 1 DD, mod MS (mean 9 mmHg), mod LAE, mild RAE, PASP 65, mod to severe TR, trivial eff // Echo 6/19:  Mild LVH, EF 55-60, no RWMA, Gr 2 DD, mild to mod AS (Mean 20), severe MS (mean 17), massive LAE, PASP 48, trivial effusion   . Peptic ulcer disease   . Pneumonia 10/21/2017  . PONV (postoperative nausea and vomiting)   . S/P aortic valve replacement with metallic valve 3/66/2947   21 mm Sorin Carbomedics bileaflet mechanical valve  . S/P mitral valve replacement with metallic valve 6/54/6503   31 mm Sorin Carbomedics Optiform bileaflet mechanical valve  . Stroke [facility])    per patient "they said i had a small stroke but i couldnt even tell"   Past Surgical History:  Procedure Laterality Date  . AV FISTULA PLACEMENT Right 02/25/2018   Procedure: INSERTION OF 81m x 16cm ARTEGRAFT;  Surgeon: CWaynetta Sandy MD;  [place] [facility]  Service: Vascular;  Laterality: Right;  . COLONOSCOPY W/ BIOPSIES AND POLYPECTOMY    . DIALYSIS FISTULA CREATION  2007  . ENDOMETRIAL ABLATION    . ESOPHAGOGASTRODUODENOSCOPY N/A 07/31/2014   Procedure: ESOPHAGOGASTRODUODENOSCOPY (EGD);  Surgeon: MClarene Essex MD;  [facility]  Service: Endoscopy;  Laterality: N/A;  . FISTULOGRAM Right 02/25/2018   Procedure: FISTULOGRAM ARM;  Surgeon: Waynetta Sandy, MD;  Location: [place];  Service: Vascular;  Laterality: Right;  . HEMATOMA EVACUATION Right 03/06/2018   Procedure: EVACUATION HEMATOMA RIGHT UPPER ARM;  Surgeon: Waynetta Sandy, MD;  Location: [place];  Service: Vascular;  Laterality: Right;  . INSERTION OF ARTERIOVENOUS (AV) ARTEGRAFT ARM Right 09/26/2017   Procedure: INSERTION OF ARTERIOVENOUS (AV) ARTEGRAFT INTO RIGHT ARM;  Surgeon: Waynetta Sandy, MD;  Location: [place];  Service: Vascular;  Laterality: Right;  . INSERTION  OF DIALYSIS CATHETER Right 03/06/2018   Procedure: INSERTION OF DIALYSIS CATHETER, right internal jugular;  Surgeon: Waynetta Sandy, MD;  Location: [place];  Service: Vascular;  Laterality: Right;  . REVISON OF ARTERIOVENOUS FISTULA Right 09/26/2017   Procedure: REVISION OF ARTERIOVENOUS FISTULA RIGHT ARM WITH ARTEGRAFT;  Surgeon: Waynetta Sandy, MD;  Location: [place];  Service: Vascular;  Laterality: Right;  . REVISON OF ARTERIOVENOUS FISTULA Right 02/25/2018   Procedure: REVISION OF ARTERIOVENOUS FISTULA;  Surgeon: Waynetta Sandy, MD;  Location: [place];  Service: Vascular;  Laterality: Right;  . RIGHT/LEFT HEART CATH AND CORONARY ANGIOGRAPHY N/A 07/24/2018   Procedure: RIGHT/LEFT HEART CATH AND CORONARY ANGIOGRAPHY;  Surgeon: Larey Dresser, MD;  Location: [place] CV [facility];  Service: Cardiovascular;  Laterality: N/A;  . SHOULDER OPEN ROTATOR CUFF REPAIR Right 10/09/2016   Procedure: ROTATOR CUFF REPAIR SHOULDER OPEN partial acrominectomy and extensive synovectomy;  Surgeon: Latanya [name], MD;  Location: [facility];  Service: Orthopedics;  Laterality: Right;  RNFA  . TEE WITHOUT CARDIOVERSION N/A 01/02/2018   Procedure: TRANSESOPHAGEAL ECHOCARDIOGRAM (TEE) Bubble Study;  Surgeon: Larey Dresser, MD;  Location: [facility] ENDOSCOPY;  Service: Cardiovascular;  Laterality: N/A;  . TUBAL LIGATION     Family History  Problem Relation Age of Onset  . Liver cancer Maternal Grandmother   . Lymphoma Maternal Aunt   . Hypertension Mother   . Renal Disease Father   . Hypertension Father   . Heart attack Neg Hx    Social History:  reports that she quit smoking 12 days ago. Her smoking use included cigarettes. She started smoking about 13 months ago. She has a 1.00 pack-year smoking history. She has never used smokeless tobacco. She reports current alcohol use of about 1.0 standard drinks of alcohol per week. She reports current drug use. Drugs: Marijuana and Cocaine. No Known  Allergies Prior to Admission medications   Medication Sig Start Date End Date Taking? Authorizing Provider  amLODipine (NORVASC) 10 MG tablet Take 1 tablet (10 mg total) by mouth at bedtime. 03/15/18  Yes Roxan Hockey, MD  aspirin EC 81 MG tablet Take 81 mg by mouth at bedtime.   Yes [provider]  calcitRIOL (ROCALTROL) 0.25 MCG capsule Take 7 capsules (1.75 mcg total) by mouth every other day. Patient taking differently: Take 1.75 mcg by mouth every Monday, Wednesday, and Friday. At hemodyalisis 10/27/17  Yes Phillips Grout, MD  cloNIDine (CATAPRES) 0.2 MG tablet Take 1 tablet (0.2 mg) by mouth twice daily - on dialysis days take after dialysis and at bedtime, on other days take one in the morning and one at night 09/04/18  Yes Fay Records, MD  ferric citrate (AURYXIA) 1 GM 210 MG(Fe) tablet Take 840 mg by mouth 3 (three) times daily with meals.    Yes [provider]  ibuprofen (ADVIL) 200 MG tablet Take 400 mg by mouth every 6 (six) hours as needed for moderate pain.   Yes [provider]  labetalol (NORMODYNE) 300 MG tablet Take 1 tablet (300 mg total) by mouth 2 (two) times daily. 08/29/18  Yes Fay Records, MD  multivitamin (RENA-VIT) TABS tablet Take 1 tablet by mouth at bedtime.    Yes [provider]  ondansetron (ZOFRAN) 4 MG tablet Take 4 mg by mouth every 6 (six) hours as needed for nausea or vomiting.   Yes [provider]  pantoprazole (PROTONIX) 40 MG tablet Take 40 mg by mouth 2 (two) times daily.    Yes [provider]  predniSONE (DELTASONE) 10 MG tablet Take 10 mg by mouth daily with breakfast.   Yes [provider]  hydrALAZINE (APRESOLINE) 25 MG tablet Take 1 tablet (25 mg total) by mouth 3 (three) times daily. Patient not taking: Reported on 10/30/2018 09/02/18   Fay Records, MD  lanthanum (FOSRENOL) 1000 MG chewable tablet Chew 2 tablets (2,000 mg total) by mouth 3 (three) times daily with meals. Patient  not taking: Reported on 11/19/2018 05/05/15   Eugenie Filler, MD  oxyCODONE-acetaminophen (PERCOCET) 7.5-325 MG tablet Take 1 tablet by mouth every 6 (six) hours as needed for severe pain. 03/06/18   Rhyne, Hulen Shouts, PA-C   Current Facility-Administered Medications  Medication Dose Route Frequency Provider Last Rate Last Dose  . 0.45 % sodium chloride infusion   Intravenous Continuous PRN Rexene Alberts, MD      . 0.9 %  sodium chloride infusion   Intravenous Continuous Rexene Alberts, MD 100 mL/hr at 12/02/18 1455    . [START ON 12/03/2018] 0.9 %  sodium chloride infusion  250 mL Intravenous Continuous Rexene Alberts, MD      . 0.9 %  sodium chloride infusion   Intravenous Continuous Rexene Alberts, MD      . Derrill Memo ON 12/03/2018] acetaminophen (TYLENOL) tablet 1,000 mg  1,000 mg Oral Q6H Rexene Alberts, MD       Or  . Derrill Memo ON 12/03/2018] acetaminophen (TYLENOL) solution 1,000 mg  1,000 mg Per Tube Q6H Rexene Alberts, MD      . acetaminophen (TYLENOL) solution 650 mg  650 mg Per Tube Once Rexene Alberts, MD       Or  . acetaminophen (TYLENOL) suppository 650 mg  650 mg Rectal Once Rexene Alberts, MD      . albumin human 5 % solution 12.5 g  250 mL Intravenous Q15 min PRN Rexene Alberts, MD      . Derrill Memo ON 12/03/2018] aspirin EC tablet 325 mg  325 mg Oral Daily Rexene Alberts, MD       Or  . Derrill Memo ON 12/03/2018] aspirin chewable tablet 324 mg  324 mg Per Tube Daily Rexene Alberts, MD      . Derrill Memo ON 12/03/2018] bisacodyl (DULCOLAX) EC tablet 10 mg  10 mg Oral Daily Rexene Alberts, MD       Or  . Derrill Memo ON 12/03/2018] bisacodyl (DULCOLAX) suppository 10 mg  10 mg Rectal Daily Rexene Alberts, MD      . cefUROXime (ZINACEF) 1.5 g in sodium chloride 0.9 % 100 mL IVPB  1.5 g Intravenous Q12H Rexene Alberts, MD      . chlorhexidine (PERIDEX) 0.12 % solution 15 mL  15 mL Mouth/Throat NOW Rexene Alberts, MD      . dexmedetomidine (PRECEDEX) 200 MCG/50ML (4 mcg/mL)  infusion  0-0.7 mcg/kg/hr Intravenous Continuous Rexene Alberts, MD 11.04 mL/hr at 12/02/18 1502 0.7 mcg/kg/hr  at 12/02/18 1502  . [START ON 12/03/2018] docusate sodium (COLACE) capsule 200 mg  200 mg Oral Daily Rexene Alberts, MD      . famotidine (PEPCID) IVPB 20 mg premix  20 mg Intravenous Q12H Rexene Alberts, MD 100 mL/hr at 12/02/18 1505 20 mg at 12/02/18 1505  . hydrocortisone sodium succinate (SOLU-CORTEF) 100 MG injection 50 mg  50 mg Intravenous Q6H Darylene Price H, MD      . insulin regular bolus via infusion 0-10 Units  0-10 Units Intravenous TID WC Rexene Alberts, MD      . insulin regular, human (MYXREDLIN) 100 units/ 100 mL infusion   Intravenous Continuous Rexene Alberts, MD 1.1 mL/hr at 12/02/18 1405 1.1 Units/hr at 12/02/18 1405  . lactated ringers infusion 500 mL  500 mL Intravenous Once PRN Rexene Alberts, MD      . lactated ringers infusion   Intravenous Continuous Rexene Alberts, MD 150 mL/hr at 12/02/18 1405    . lactated ringers infusion   Intravenous Continuous Rexene Alberts, MD      . metoprolol tartrate (LOPRESSOR) injection 2.5-5 mg  2.5-5 mg Intravenous Q2H PRN Rexene Alberts, MD      . midazolam (VERSED) injection 2 mg  2 mg Intravenous Q1H PRN Rexene Alberts, MD      . morphine 2 MG/ML injection 1-4 mg  1-4 mg Intravenous Q1H PRN Rexene Alberts, MD   2 mg at 12/02/18 1523  . nitroGLYCERIN 50 mg in dextrose 5 % 250 mL (0.2 mg/mL) infusion  0-100 mcg/min Intravenous Titrated Rexene Alberts, MD 3 mL/hr at 12/02/18 1456 10 mcg/min at 12/02/18 1456  . ondansetron (ZOFRAN) injection 4 mg  4 mg Intravenous Q6H PRN Rexene Alberts, MD      . oxyCODONE (Oxy IR/ROXICODONE) immediate release tablet 5-10 mg  5-10 mg Oral Q3H PRN Rexene Alberts, MD      . Derrill Memo ON 12/04/2018] pantoprazole (PROTONIX) EC tablet 40 mg  40 mg Oral Daily Darylene Price H, MD      . phenylephrine (NEOSYNEPHRINE) 20-0.9 MG/250ML-% infusion  0-100 mcg/min Intravenous Titrated  Rexene Alberts, MD 15 mL/hr at 12/02/18 1405 20 mcg/min at 12/02/18 1405  . [START ON 12/03/2018] sodium chloride flush (NS) 0.9 % injection 3 mL  3 mL Intravenous Q12H Rexene Alberts, MD      . Derrill Memo ON 12/03/2018] sodium chloride flush (NS) 0.9 % injection 3 mL  3 mL Intravenous PRN Rexene Alberts, MD      . traMADol Veatrice Bourbon) tablet 50-100 mg  50-100 mg Oral Q4H PRN Rexene Alberts, MD      . vancomycin (VANCOCIN) IVPB 1000 mg/200 mL premix  1,000 mg Intravenous Once Rexene Alberts, MD       Labs: Basic Metabolic Panel: Recent Labs  Lab 11/27/18 1225  12/02/18 1050 12/02/18 1144 12/02/18 1154 12/02/18 1241  NA 133*   < > 132* 134* 133* 135  K 4.6   < > 4.4 4.6 5.2* 4.3  CL 94*  --   --   --   --   --   CO2 18*  --   --   --   --   --   GLUCOSE 94   < > 114* 116*  --  93  BUN 59*  --   --   --   --   --   CREATININE 7.15*  --   --   --   --   --  CALCIUM 8.9  --   --   --   --   --    < > = values in this interval not displayed.   Liver Function Tests: Recent Labs  Lab 11/27/18 1225  AST 21  ALT 17  ALKPHOS 60  BILITOT 0.3  PROT 6.4*  ALBUMIN 3.2*   CBC: Recent Labs  Lab 11/27/18 1225  12/02/18 1148 12/02/18 1154 12/02/18 1241 12/02/18 1415  WBC 5.7  --   --   --   --  5.5  HGB 11.5*   < > 7.6* 8.5* 8.2* 8.4*  HCT 36.6   < > 23.9* 25.0* 24.0* 27.3*  MCV 102.8*  --   --   --   --  105.8*  PLT 188  --  127*  --   --  102*   < > = values in this interval not displayed.  Studies/Results: Dg Chest Port 1 View  Result Date: 12/02/2018 CLINICAL DATA:  Postop film for valve replacement surgery. EXAM: PORTABLE CHEST 1 VIEW COMPARISON:  Chest x-ray dated 11/27/2018. FINDINGS: Endotracheal tube appears well positioned with tip above the level of the carina. Enteric tube passes below the diaphragm. Swan-Ganz catheter in place with tip approximately 6 cm to the RIGHT of midline. Mediastinal drains in place. New valve in place. Mild central pulmonary vascular  congestion. Probable mild bibasilar atelectasis and/or small pleural effusions. Small RIGHT apical pneumothorax. Bilateral chest tubes in place. IMPRESSION: 1. Tip of Swan-Ganz catheter is approximately 6 cm to the RIGHT of midline. For optimal radiographic positioning, would consider retracting to a position closer to the midline. 2. Small RIGHT apical pneumothorax. Bilateral chest tubes in place. 3. Mild central pulmonary vascular congestion. Probable mild bibasilar atelectasis and/or small pleural effusions. These results will be called to the ordering clinician or representative by the Radiologist Assistant, and communication documented in the PACS or zVision Dashboard. Electronically Signed   By: Franki Cabot M.D.   On: 12/02/2018 14:45    ROS: Unable to obtain ROS due to patient sedated and intubated.   Physical Exam: Vitals:   12/02/18 0558 12/02/18 0559 12/02/18 1359  BP:  (!) 148/85 (!) 77/36  Pulse:  74 75  Resp:  19 (!) 22  Temp:  98.3 F (36.8 C)   TempSrc:  Oral   SpO2:  98% 96%  Weight: 63 kg 63 kg   Height: _0  (1.676 m) _1  (1.676 m)      General: WDWN, NAD, thin female, intubated and sedated Head: NCAT sclera not icteric Neck: Supple. No lymphadenopathy Lungs: CTA bilaterally. Intubated with +spontaneous breaths, No wheeze, rales or rhonchi.  Heart: RRR, +mechanical heart sounds. No murmur, rubs or gallops appreciated.  Abdomen: soft, nontender, +BS Lower extremities:no edema, ischemic changes, or open wounds  Neuro: Sedated Dialysis Access: RU AVF +b  Dialysis Orders:  MWF - [facility]  3.5hrs, BFR 450, DFR 800,  [name] 61.5kg, 2K/ 2Ca  Access: RU AVF  Heparin 5000 unit bolus Mircera 200 mcg q2wks - last on 7/3 Calcitriol 3 mcg PO qHD  Assessment/Plan: 1.  Severe MS/moderate AS - s/p MVR & AVR by Dr. Roxy Manns today. Per CTS 2.  ESRD -  On HD MWF. K 4.3 pre surgery.  Order RFP for AM.  Due for HD tomorrow, will follow labs and hold off on HD until needed.  No Heparin.  Am always hesitant to throw dialysis into the mix.  Will check labs in AM and hold off  as long as is safe to do so, until off drips 3.  Hypertension/Volume - BP soft post surgery. Does not appear volume overloaded on exam.  Will reassess daily.  4.  Anemia of CKD - Hgb 8.4. Due for ESA on Friday.  Follow trend post surgery and transfuse as indicated.  5.  Secondary Hyperparathyroidism -  OP Ca at goal. Phos elevated.  Will resume VDRA, binders once eating.  6.  Nutrition - Renal diet once advanced, Vit 7. Pulmonary HTN 8. Lupus 9. GERD   Jen Mow, PA-C [facility] Pager: [phone] 12/02/2018, 3:28 PM   Patient seen and examined, agree with above note with above modifications. Patient known to [place].  Presented for valve replacements electively.  Now post op in CVICU on multiple drips, still intubated.  LAbs ordered for the AM and we will follow closely for HD need- mindful to not rock the boat too soon if is not needed  Corliss Parish, MD 12/02/2018

## 2018-12-02 NOTE — Plan of Care (Signed)
  Problem: Cardiac: Goal: Will achieve and/or maintain hemodynamic stability Outcome: Progressing   Problem: Respiratory: Goal: Respiratory status will improve Outcome: Progressing   

## 2018-12-02 NOTE — Progress Notes (Signed)
Echocardiogram Echocardiogram Transesophageal has been performed.  [place]al Deputy Adonay Scheier 12/02/2018, 8:32 AM

## 2018-12-02 NOTE — Transfer of Care (Signed)
Immediate Anesthesia Transfer of Care Note  Patient: [name]  Procedure(s) Performed: AORTIC VALVE REPLACEMENT (AVR) USING CARBOMEDICS SUPRA-ANNULAR TOP HAT SIZE 21MM (N/A Chest) MITRAL VALVE (MV) REPLACEMENT USING CARBOMEDICS OPTIFORM SIZE 31MM (N/A Chest) TRANSESOPHAGEAL ECHOCARDIOGRAM (TEE) (N/A )  Patient Location: SICU  Anesthesia Type:General  Level of Consciousness: Patient remains intubated per anesthesia plan  Airway & Oxygen Therapy: Patient remains intubated per anesthesia plan  Post-op Assessment: Report given to RN and Post -op Vital signs reviewed and stable  Post vital signs: Reviewed and stable  Last Vitals:  Vitals Value Taken Time  BP 77/36 12/02/18 1359  Temp    Pulse 75 12/02/18 1359  Resp 22 12/02/18 1359  SpO2 96 % 12/02/18 1359    Last Pain:  Vitals:   12/02/18 0559  TempSrc: Oral  PainSc:       Patients Stated Pain Goal: 3 (03/00/92 3300)  Complications: No apparent anesthesia complications

## 2018-12-02 NOTE — Anesthesia Preprocedure Evaluation (Signed)
Anesthesia Evaluation  Patient identified by MRN, date of birth, ID band Patient awake    Reviewed: Allergy & Precautions, H&P , NPO status , Patient's Chart, lab work & pertinent test results  History of Anesthesia Complications (+) PONV and history of anesthetic complications  Airway Mallampati: II   Neck ROM: full    Dental   Pulmonary former smoker,    breath sounds clear to auscultation       Cardiovascular hypertension, +CHF  + Valvular Problems/Murmurs AS  Rhythm:regular Rate:Normal  Moderate AS. Severe mitral stenosis. Preserved LV function.   Neuro/Psych  Headaches, PSYCHIATRIC DISORDERS Anxiety CVA    GI/Hepatic hiatal hernia, PUD, GERD  ,  Endo/Other    Renal/GU ESRF and DialysisRenal disease     Musculoskeletal  (+) Arthritis ,   Abdominal   Peds  Hematology   Anesthesia Other Findings   Reproductive/Obstetrics                             Anesthesia Physical Anesthesia Plan  ASA: IV  Anesthesia Plan: General   Post-op Pain Management:    Induction: Intravenous  PONV Risk Score and Plan: 4 or greater and Ondansetron, Dexamethasone, Midazolam and Treatment may vary due to age or medical condition  Airway Management Planned: Oral ETT  Additional Equipment: Arterial line, CVP, TEE, 3D TEE, PA Cath and Ultrasound Guidance Line Placement  Intra-op Plan:   Post-operative Plan: Post-operative intubation/ventilation  Informed Consent: I have reviewed the patients History and Physical, chart, labs and discussed the procedure including the risks, benefits and alternatives for the proposed anesthesia with the patient or authorized representative who has indicated his/her understanding and acceptance.       Plan Discussed with: CRNA, Anesthesiologist and Surgeon  Anesthesia Plan Comments:         Anesthesia Quick Evaluation

## 2018-12-02 NOTE — Anesthesia Procedure Notes (Signed)
Procedure Name: Intubation Date/Time: 12/02/2018 8:03 AM Performed by: Clearnce Sorrel, CRNA Pre-anesthesia Checklist: Patient identified, Emergency Drugs available, Suction available, Patient being monitored and Timeout performed Patient Re-evaluated:Patient Re-evaluated prior to induction Oxygen Delivery Method: Circle system utilized Preoxygenation: Pre-oxygenation with 100% oxygen Induction Type: IV induction Ventilation: Mask ventilation without difficulty Laryngoscope Size: Mac and 3 Grade View: Grade I Tube type: Oral Tube size: 8.0 mm Number of attempts: 1 Airway Equipment and Method: Stylet Placement Confirmation: ETT inserted through vocal cords under direct vision,  positive ETCO2 and breath sounds checked- equal and bilateral Secured at: 22 cm Tube secured with: Tape Dental Injury: Teeth and Oropharynx as per pre-operative assessment

## 2018-12-02 NOTE — Anesthesia Procedure Notes (Signed)
Arterial Line Insertion Start/End7/14/2020 7:00 AM, 12/02/2018 7:05 AM  Patient location: Pre-op. Preanesthetic checklist: patient identified, IV checked, site marked, risks and benefits discussed, surgical consent, monitors and equipment checked, pre-op evaluation, timeout performed and anesthesia consent Lidocaine 1% used for infiltration Left, radial was placed Catheter size: 20 Fr Hand hygiene performed  and maximum sterile barriers used   Attempts: 1 Procedure performed without using ultrasound guided technique. Following insertion, dressing applied. Post procedure assessment: normal and unchanged

## 2018-12-03 ENCOUNTER — Other Ambulatory Visit: Payer: Self-pay

## 2018-12-03 ENCOUNTER — Inpatient Hospital Stay (HOSPITAL_COMMUNITY): Payer: Medicare Other

## 2018-12-03 ENCOUNTER — Encounter (HOSPITAL_COMMUNITY): Payer: Self-pay | Admitting: General Practice

## 2018-12-03 LAB — CBC
HCT: 29.2 % — ABNORMAL LOW (ref 36.0–46.0)
HCT: 27.7 % — ABNORMAL LOW (ref 36.0–46.0)
Hemoglobin: 8.7 g/dL — ABNORMAL LOW (ref 12.0–15.0)
Hemoglobin: 8.6 g/dL — ABNORMAL LOW (ref 12.0–15.0)
MCH: 31.9 pg (ref 26.0–34.0)
MCH: 32.3 pg (ref 26.0–34.0)
MCHC: 29.8 g/dL — ABNORMAL LOW (ref 30.0–36.0)
MCHC: 31 g/dL (ref 30.0–36.0)
MCV: 107 fL — ABNORMAL HIGH (ref 80.0–100.0)
MCV: 104.1 fL — ABNORMAL HIGH (ref 80.0–100.0)
Platelets: 145 10*3/uL — ABNORMAL LOW (ref 150–400)
Platelets: 117 10*3/uL — ABNORMAL LOW (ref 150–400)
RBC: 2.73 MIL/uL — ABNORMAL LOW (ref 3.87–5.11)
RBC: 2.66 MIL/uL — ABNORMAL LOW (ref 3.87–5.11)
RDW: 18.2 % — ABNORMAL HIGH (ref 11.5–15.5)
RDW: 18.2 % — ABNORMAL HIGH (ref 11.5–15.5)
WBC: 10.4 10*3/uL (ref 4.0–10.5)
WBC: 8.2 10*3/uL (ref 4.0–10.5)
nRBC: 0.2 % (ref 0.0–0.2)
nRBC: 0.6 % — ABNORMAL HIGH (ref 0.0–0.2)

## 2018-12-03 LAB — BASIC METABOLIC PANEL
Anion gap: 17 — ABNORMAL HIGH (ref 5–15)
Anion gap: 12 (ref 5–15)
BUN: 54 mg/dL — ABNORMAL HIGH (ref 6–20)
BUN: 20 mg/dL (ref 6–20)
CO2: 19 mmol/L — ABNORMAL LOW (ref 22–32)
CO2: 25 mmol/L (ref 22–32)
Calcium: 8.7 mg/dL — ABNORMAL LOW (ref 8.9–10.3)
Calcium: 8 mg/dL — ABNORMAL LOW (ref 8.9–10.3)
Chloride: 100 mmol/L (ref 98–111)
Chloride: 99 mmol/L (ref 98–111)
Creatinine, Ser: 6.99 mg/dL — ABNORMAL HIGH (ref 0.44–1.00)
Creatinine, Ser: 3.21 mg/dL — ABNORMAL HIGH (ref 0.44–1.00)
GFR calc Af Amer: 7 mL/min — ABNORMAL LOW (ref >60)
GFR calc Af Amer: 18 mL/min — ABNORMAL LOW (ref >60)
GFR calc non Af Amer: 6 mL/min — ABNORMAL LOW (ref >60)
GFR calc non Af Amer: 15 mL/min — ABNORMAL LOW (ref >60)
Glucose, Bld: 89 mg/dL (ref 70–99)
Glucose, Bld: 81 mg/dL (ref 70–99)
Potassium: 5.9 mmol/L — ABNORMAL HIGH (ref 3.5–5.1)
Potassium: 3.6 mmol/L (ref 3.5–5.1)
Sodium: 136 mmol/L (ref 135–145)
Sodium: 136 mmol/L (ref 135–145)

## 2018-12-03 LAB — ECHO INTRAOPERATIVE TEE
Height: 66 in
Weight: 2224 oz

## 2018-12-03 LAB — POCT I-STAT, CHEM 8
BUN: 35 mg/dL — ABNORMAL HIGH (ref 6–20)
Calcium, Ion: 1.17 mmol/L (ref 1.15–1.40)
Chloride: 100 mmol/L (ref 98–111)
Creatinine, Ser: 5.6 mg/dL — ABNORMAL HIGH (ref 0.44–1.00)
Glucose, Bld: 60 mg/dL — ABNORMAL LOW (ref 70–99)
HCT: 28 % — ABNORMAL LOW (ref 36.0–46.0)
Hemoglobin: 9.5 g/dL — ABNORMAL LOW (ref 12.0–15.0)
Potassium: 4 mmol/L (ref 3.5–5.1)
Sodium: 136 mmol/L (ref 135–145)
TCO2: 24 mmol/L (ref 22–32)

## 2018-12-03 LAB — MAGNESIUM
Magnesium: 2.7 mg/dL — ABNORMAL HIGH (ref 1.7–2.4)
Magnesium: 1.9 mg/dL (ref 1.7–2.4)

## 2018-12-03 LAB — GLUCOSE, CAPILLARY
Glucose-Capillary: 101 mg/dL — ABNORMAL HIGH (ref 70–99)
Glucose-Capillary: 104 mg/dL — ABNORMAL HIGH (ref 70–99)
Glucose-Capillary: 75 mg/dL (ref 70–99)
Glucose-Capillary: 75 mg/dL (ref 70–99)
Glucose-Capillary: 85 mg/dL (ref 70–99)
Glucose-Capillary: 96 mg/dL (ref 70–99)
Glucose-Capillary: 98 mg/dL (ref 70–99)

## 2018-12-03 MED ORDER — CHLORHEXIDINE GLUCONATE CLOTH 2 % EX PADS: 6.0000 | MEDICATED_PAD | Freq: Every day | CUTANEOUS | Status: DC

## 2018-12-03 MED ORDER — ASPIRIN EC 325 MG PO TBEC
325.0000 mg | DELAYED_RELEASE_TABLET | Freq: Every day | ORAL | Status: AC
  Administered 2018-12-03: 325 mg via ORAL
  Filled 2018-12-03: qty 1

## 2018-12-03 MED ORDER — CALCITRIOL 0.5 MCG PO CAPS
3.0000 ug | ORAL_CAPSULE | ORAL | Status: DC
  Administered 2018-12-03 – 2018-12-10 (×4): 3 ug via ORAL
  Filled 2018-12-03 (×4): qty 6

## 2018-12-03 MED ORDER — RENA-VITE PO TABS
1.0000 | ORAL_TABLET | Freq: Every day | ORAL | Status: DC
  Administered 2018-12-03 – 2018-12-10 (×7): 1 via ORAL
  Filled 2018-12-03 (×8): qty 1

## 2018-12-03 MED ORDER — WARFARIN - PHYSICIAN DOSING INPATIENT
Freq: Every day | Status: DC
  Administered 2018-12-07: 18:00:00

## 2018-12-03 MED ORDER — ASPIRIN EC 81 MG PO TBEC: 81.0000 mg | DELAYED_RELEASE_TABLET | Freq: Every day | ORAL | Status: DC

## 2018-12-03 MED ORDER — PANTOPRAZOLE SODIUM 40 MG PO TBEC
40.0000 mg | DELAYED_RELEASE_TABLET | Freq: Two times a day (BID) | ORAL | Status: DC
  Administered 2018-12-03 – 2018-12-11 (×17): 40 mg via ORAL
  Filled 2018-12-03 (×17): qty 1

## 2018-12-03 MED ORDER — LANTHANUM CARBONATE 500 MG PO CHEW
2000.0000 mg | CHEWABLE_TABLET | Freq: Three times a day (TID) | ORAL | Status: DC
  Administered 2018-12-04 – 2018-12-11 (×18): 2000 mg via ORAL
  Filled 2018-12-03 (×22): qty 4

## 2018-12-03 MED ORDER — CHLORHEXIDINE GLUCONATE CLOTH 2 % EX PADS
6.0000 | MEDICATED_PAD | Freq: Every day | CUTANEOUS | Status: DC
  Administered 2018-12-03 – 2018-12-09 (×5): 6 via TOPICAL

## 2018-12-03 MED ORDER — CALCITRIOL 0.25 MCG PO CAPS: 1.7500 ug | ORAL_CAPSULE | ORAL | Status: DC

## 2018-12-03 MED ORDER — PREDNISONE 10 MG PO TABS
10.0000 mg | ORAL_TABLET | Freq: Every day | ORAL | Status: DC
  Administered 2018-12-04 – 2018-12-07 (×4): 10 mg via ORAL
  Filled 2018-12-03 (×4): qty 1

## 2018-12-03 MED ORDER — COUMADIN BOOK
Freq: Once | Status: AC
  Administered 2018-12-03: 17:00:00
  Filled 2018-12-03: qty 1

## 2018-12-03 MED ORDER — WARFARIN SODIUM 2.5 MG PO TABS
2.5000 mg | ORAL_TABLET | Freq: Every day | ORAL | Status: DC
  Administered 2018-12-03 – 2018-12-10 (×8): 2.5 mg via ORAL
  Filled 2018-12-03 (×8): qty 1

## 2018-12-03 MED FILL — Potassium Chloride Inj 2 mEq/ML: INTRAVENOUS | Qty: 40 | Status: AC

## 2018-12-03 MED FILL — Calcium Chloride Inj 10%: INTRAVENOUS | Qty: 10 | Status: AC

## 2018-12-03 MED FILL — Heparin Sodium (Porcine) Inj 1000 Unit/ML: INTRAMUSCULAR | Qty: 30 | Status: AC

## 2018-12-03 MED FILL — Electrolyte-R (PH 7.4) Solution: INTRAVENOUS | Qty: 3000 | Status: AC

## 2018-12-03 MED FILL — Heparin Sodium (Porcine) Inj 1000 Unit/ML: INTRAMUSCULAR | Qty: 60 | Status: AC

## 2018-12-03 MED FILL — Lidocaine HCl Local Preservative Free (PF) Inj 2%: INTRAMUSCULAR | Qty: 15 | Status: AC

## 2018-12-03 MED FILL — Sodium Bicarbonate IV Soln 8.4%: INTRAVENOUS | Qty: 50 | Status: AC

## 2018-12-03 MED FILL — Mannitol IV Soln 20%: INTRAVENOUS | Qty: 500 | Status: AC

## 2018-12-03 NOTE — Progress Notes (Signed)
Patient ID: [name], female   DOB: 09-29-1960, 58 y.o.   MRN: [id number] TCTS Evening Rounds:  Hemodynamically stable  sats 100%  Had HD today.  BMET    Component Value Date/Time   NA 136 12/03/2018 1646   K 3.6 12/03/2018 1646   CL 99 12/03/2018 1646   CO2 25 12/03/2018 1646   GLUCOSE 81 12/03/2018 1646   BUN 20 12/03/2018 1646   CREATININE 3.21 (H) 12/03/2018 1646   CALCIUM 8.0 (L) 12/03/2018 1646   GFRNONAA 15 (L) 12/03/2018 1646   GFRAA 18 (L) 12/03/2018 1646   CBC    Component Value Date/Time   WBC 8.2 12/03/2018 1646   RBC 2.66 (L) 12/03/2018 1646   HGB 8.6 (L) 12/03/2018 1646   HCT 27.7 (L) 12/03/2018 1646   PLT 117 (L) 12/03/2018 1646   MCV 104.1 (H) 12/03/2018 1646   MCH 32.3 12/03/2018 1646   MCHC 31.0 12/03/2018 1646   RDW 18.2 (H) 12/03/2018 1646   LYMPHSABS 0.7 03/14/2018 1410   MONOABS 0.4 03/14/2018 1410   EOSABS 0.1 03/14/2018 1410   BASOSABS 0.1 03/14/2018 1410   Continue present course

## 2018-12-03 NOTE — Discharge Summary (Signed)
Physician Discharge Summary  Patient ID: Gargi Berch MRN: [id number] DOB/AGE: 57/07/1960 57 y.o.  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 Active Problems:   Hypertensive heart disease with CHF (congestive heart failure) (HCC)   End-stage renal disease on hemodialysis (HCC)   Lupus (systemic lupus erythematosus) (HCC)   Anemia   Mitral stenosis   Aortic stenosis   HTN (hypertension)   Chronic pain   Chronic diastolic (congestive) heart failure (HCC)   Left bundle branch block   First degree atrioventricular block      Discharged Condition: stable  History of Present Illness:  Patient is a 58 year old African-American female with history of aortic stenosis, mitral stenosis, end-stage renal disease on hemodialysis, hypertension, lupus, remote history of stroke, dysfunctional uterine bleeding, peptic ulcer disease, and chronic anemia who has been referred for surgical consultation to discuss treatment options for management of aortic stenosis and mitral stenosis.  Patient's cardiac history dates back to 2016 when she was hospitalized with acute hypoxic respiratory failure and flash pulmonary edema in the setting of influenza A pneumonia and hypertensive urgency. She was treated with dialysis and Tamiflu and recovered. Cardiac enzymes were elevated at that time. Echocardiogram revealed normal left ventricular function with moderate mitral stenosis and mild aortic stenosis. She has been followed intermittently ever since by Dr. Harrington Challenger. She has had  numerous hospitalizations over the last several years for a variety of problems. June 2019 she was hospitalized with respiratory distress and volume overload. Apparently she was in rapid atrial fibrillation for a brief period of time. Transesophageal echocardiogram performed January 02, 2018 revealed normal left ventricular systolic function with moderate aortic stenosis. Findings were suggestive of possible bicuspid aortic valve. There was felt to be "at least moderate mitral stenosis".Mean transvalvular gradient across the mitral valve was estimated 16 mmHg with mitral valve area by pressure half-time calculated 2.8 cm. By planimetry the valve area measured 1.5 cm. There was mild to moderate mitral regurgitation and heavy calcification of the mitral valve with severely restricted leaflet mobility. She was treated medically and seen mostrecently in follow-up by Dr. Harrington Challenger on July 03, 2018. Follow-up echocardiogram performed at that time revealed increased transvalvular gradients across both the mitral and aortic valves and findings suggestive of high filling pressures. Peak transvalvular gradient across the aortic valve measured 4.1 m/s corresponding to mean transvalvular gradient estimated 33 mmHg and aortic valve area calculated 1.1 cm. The DVI was relatively high and reported 0.37. Mean transvalvular gradient across the mitral valve was reported 21.7 mmHg but the mitral valve area calculated based on pressure half-time was reported 3.91 cm. Left ventricular systolic function remain normal with ejection fraction estimated 60 to 65%. The patient subsequently underwent left and right heart catheterization on July 24, 2018. She is found to have normal coronary artery anatomy with no significant coronary artery disease. 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 There was mildly elevated pulmonary capillary wedge pressure  (19 mmHg) and highnormal cardiac output (6.15 L/min). There was severe mitral stenosis with mean transvalvular  gradient estimated 22 mmHg. There was moderate aortic stenosis with mean transvalvular gradients measured 10 mmHg using simultaneous waveforms from the femoral artery to the left ventricle and 24 mmHg on pullback gradient. Cardiothoracic surgical consultation was requested.  Patient is single and lives with 1 of her adult daughters locally in [place]. She has been disabled for approximately 12 years because of her underlying chronic kidney disease. She remains functionally independent. She drives an automobile and takes care of ordinary day-to-day activities around the house. She dialyzes on a Monday Wednesday Friday schedule at the [facility] dialysis center via right upper arm AV fistula. She has not had problems with dialysis treatments recently. She describes stable symptoms of exertional shortness of breath and decreased energy.She states that her primary problem is that of low energy. She does get short of breath if she goes up a flight of stairs. She denies resting shortness of breath, PND, orthopnea, or lower extremity edema. She has never had any chest pain or chest tightness. She reports occasional palpitation without any history of significant dizzy spells or syncope.  Interval history, office visit 12/01/2018  Patient is a 58 year old African-American female with history of aortic stenosis, mitral stenosis, end-stage renal disease on hemodialysis, hypertension, lupus, remote history of stroke, dysfunctional uterine bleeding, peptic ulcer disease, and chronic anemia whoreturns to the office today to further discuss possible surgical treatment of severe symptomatic aortic stenosis and mitral stenosis. She was originally seen in consultation on Sep 23, 2018. At that time the patient wished to delay any further intervention for at least a few weeks because of the  ongoing COVID-19 pandemic.She was last seen in the office on November 04, 2018 at which time we made tentative plans to proceed with surgery December 02, 2018. She returns to the office today for follow-up prior to surgery. She underwent hemodialysis this morning without any complications and subsequently completed all of her routine preoperative testing at the hospital. She states that over the last few weeks she has not had any new problems or issues. She denies any recent acute exacerbations of shortness of breath. She has not had any fevers or productive cough. Shedenies any exposures to persons with known or suspected COVID-19 infection.  Hospital Course:  COVID screening was negative.  Ms. Dexter was admitted for same-day surgery on 12/02/18 and prepared for surgery. She was taken to the OR where the aortic and mitral vales wreee replaced with [employer] mechanical prosthetic valves.  Please see the operative note below.  Theprocedure was without complication and she separated from cardiopulmonary bypass smoothly. She was transferred to the cardiovascular ICU in stable condition. She remained hemodynamically stable. She was weaned from the ventilator and extubated during the early evening on the day of surgery. Her clonidine via transdermal patch was resumed for hypertension. She was mibilized and anticoagulation with Coumadin was initiated on the first post-op day. She was seen in consultation by the nephrology service on the day of surgery and hemodialysis was resumed on POD-1 and well tolerated. Chest tubes were removed on POD2 and she was noted to have a moderate right pneumothorax on CXR the following morning. This was managed with insertion of a right pleural pigtail catheter resulting in re-expansion of the right lung. She had no active air leak and the tube was remove uneventfully a few days later. She was transferred to the 4E progressive care unit where her progress continued. She was unable to  wean form supplemental O2,. Follow up CXR demonstrated  mild basilar ATX and minimal pleural effusions. She was given a walk test on RA by the nursing staff and dropped her O2 saturations to the low 80's.  She quickly recovered to O2 sat of 95%+ on 2Lnc O2.  Arrangements were made for home O2 therapy based on this test and her history of chronic diastolic heart failure. By post-op day 9, she was ready for discharge to home. She will continue her dialysis on her usual M-W-F schedule.  Her INR on the day of discharge was 1.8 after being on Coumadin 2.5ng po daily for several days. Coumadin dosage was increased to 3mg  po daily at discharge and arrangements were made for follow up in the [facility]in [facility].   Consults: nephrology  Significant Diagnostic Studies:   Transesophageal Echocardiography  Patient: Cari, Burgo  MR #: [id number]  Study Date: 01/02/2018  Gender: F  Age: 45  Height: 167.6 cm  Weight: 56.7 kg  BSA: 1.62 m^2  Pt. Status:  Room:  Daleen Squibb, M.D.  REFERRING Dorris Carnes, M.D.  ADMITTING Loralie Champagne, M.D.  ATTENDING Loralie Champagne, M.D.  PERFORMING Loralie Champagne, M.D.  SONOGRAPHER Roseanna Rainbow  cc:  -------------------------------------------------------------------  LV EF: 55% - 60%  -------------------------------------------------------------------  Indications: 424.0 Mitral valve disease. Severe valvular  disease  -------------------------------------------------------------------  History: PMH: ESRD. Lupus. Aortic stenosis. Mitral stenosis.  Stroke. Risk factors: Hypertension.  -------------------------------------------------------------------  Study Conclusions  - Left ventricle: The cavity size was normal. Wall thickness was  increased in a pattern of mild LVH. Systolic function was normal.  The estimated ejection fraction was in the range of 55% to 60%.  Wall motion was normal; there were no regional wall motion  abnormalities.  - Aortic valve:  Functionally bicuspid aortic valve with fused  noncoronary and left coronary cusps. Moderate aortic stenosis  with mean gradient 26 mmHg. No AI.  - Aorta: Normal caliber thoracic aorta with grade III plaque  descending thoracic aorta.  - Mitral valve: The mitral valve was heavily calcified and  restricted. There was mild to moderate mitral regurgitation.  Mitral valve mean gradient 16 mmHg, PHT 78 msec, MVA 2.8 cm^2 by  PHT. By planimetry, MVA 1.5 cm^2. Looking at the valve, there  appears to be at least moderate mitral stenosis. I think that the  PHT-derived value is inaccurate.  - Left atrium: The atrium was mildly dilated. No evidence of  thrombus in the atrial cavity or appendage.  - Right ventricle: The cavity size was normal. Systolic function  was mildly reduced.  - Right atrium: No evidence of thrombus in the atrial cavity or  appendage.  - Atrial septum: No defect or patent foramen ovale was identified.  Echo contrast study showed no right-to-left atrial level shunt,  at baseline or with provocation.  - Tricuspid valve: Mild TR with peak RV-RA 41 mmHg.  Impressions:  - Moderate aortic stenosis with functionally bicuspid valve. There  was at least moderate mitral stenosis by mean gradient and  planimetry. I think that the pressure half-time derived value for  MVA was inaccurate. Mild to moderate MR.  -------------------------------------------------------------------  Study data: Study status: Routine. Consent: The risks,  benefits, and alternatives to the procedure were explained to the  patient and informed consent was obtained. Procedure: The patient  reported no pain pre or post test. Initial setup. The patient was  brought to the laboratory. Surface ECG leads were monitored.  Sedation. Conscious sedation was administered by cardiology staff.  Transesophageal echocardiography. Topical anesthesia was obtained  using viscous lidocaine. An adult multiplane transesophageal  probe  was inserted by the attending cardiologistwithout difficulty. 3D  image quality was excellent. Intravenous contrast (agitated saline)  was administered. Study completion: The patient tolerated the  procedure well. There were no complications. Administered  medications: Fentanyl, 2mcg, IV. Midazolam, 4mg , IV.  Diagnostic transesophageal echocardiography. 2D and color Doppler.  Birthdate: Patient birthdate: 1961/03/01. Age: Patient is 58 yr  old. Sex: Gender: female. BMI: 20.2 kg/m^2. Blood pressure:  180/110 Patient status: Inpatient. Study date: Study date:  01/02/2018. Study time: 09:57 AM. Location: Endoscopy.  -------------------------------------------------------------------  -------------------------------------------------------------------  Left ventricle: The cavity size was normal. Wall thickness was  increased in a pattern of mild LVH. Systolic function was normal.  The estimated ejection fraction was in the range of 55% to 60%.  Wall motion was normal; there were no regional wall motion  abnormalities.  -------------------------------------------------------------------  Aortic valve: Functionally bicuspid aortic valve with fused  noncoronary and left coronary cusps. Moderate aortic stenosis with  mean gradient 26 mmHg. No AI.  -------------------------------------------------------------------  Aorta: Normal caliber thoracic aorta with grade III plaque  descending thoracic aorta.  -------------------------------------------------------------------  Mitral valve: The mitral valve was heavily calcified and  restricted. There was mild to moderate mitral regurgitation. Mitral  valve mean gradient 16 mmHg, PHT 78 msec, MVA 2.8 cm^2 by PHT. By  planimetry, MVA 1.5 cm^2. Looking at the valve, there appears to be  at least moderate mitral stenosis. I think that the PHT-derived  value is inaccurate. Doppler: Valve area by pressure  half-time: 2.78 cm^2. Indexed valve area  by pressure half-time:  1.72 cm^2/m^2. Mean gradient (D): 14 mm Hg.  -------------------------------------------------------------------  Left atrium: The atrium was mildly dilated. No evidence of  thrombus in the atrial cavity or appendage.  -------------------------------------------------------------------  Atrial septum: No defect or patent foramen ovale was identified.  Echo contrast study showed no right-to-left atrial level shunt, at  baseline or with provocation.  -------------------------------------------------------------------  Right ventricle: The cavity size was normal. Systolic function was  mildly reduced.  -------------------------------------------------------------------  Pulmonic valve: Structurally normal valve. Cusp separation was  normal.  -------------------------------------------------------------------  Tricuspid valve: Mild TR with peak RV-RA 41 mmHg.  -------------------------------------------------------------------  Right atrium: The atrium was normal in size. No evidence of  thrombus in the atrial cavity or appendage.  -------------------------------------------------------------------  Pericardium: There was no pericardial effusion.  -------------------------------------------------------------------  Post procedure conclusions  Ascending Aorta:  - Normal caliber thoracic aorta with grade III plaque descending  thoracic aorta.  -------------------------------------------------------------------  Measurements  Aortic valve Value  Aortic valve peak velocity, S 297 cm/s  Aortic valve mean velocity, S 195 cm/s  Aortic valve VTI, S 65.1 cm  Mitral valve Value  Mitral mean velocity, D 172 cm/s  Mitral pressure half-time 78 ms  Mitral mean gradient, D 14 mm Hg  Mitral valve area, PHT, DP 2.78 cm^2  Mitral valve area/bsa, PHT, DP 1.72 cm^2/m^2  Mitral annulus VTI, D 55.9 cm  Legend:  (L) and (H) mark values outside specified reference range.    -------------------------------------------------------------------  Prepared and Electronically Authenticated by  Loralie Champagne, M.D.  2019-09-30T14:12:12  ECHOCARDIOGRAM REPORT  Patient Name: [name] Date of Exam: 07/03/2018  Medical Rec #: [id number] Height: 66.0 in  Accession #: [id number] Weight: 139.0 lb  Date of Birth: Jan 22, 1961 BSA: 1.71 m  Patient Age: 35 years BP: 140/87 mmHg  Patient Gender: F HR: 76 bpm.  Exam Location: [address]  Procedure: 2D Echo, Cardiac Doppler and Color Doppler  Indications: I35.9 Aortic valve disorder.  History: Patient has prior history of Echocardiogram examinations, most  recent 01/02/2018. Stroke, Atrial Fibrillation; Signs/Symptoms:  Dyspnea; Risk Factors: Hypertension. Aortic stenosis. Mitral  stenosis. Anemia. End stage renal disease. Lupus.  Sonographer: Diamond Nickel RCS  Referring Phys: [id number] PAULA ROSS V    IMPRESSIONS  1. The left ventricle has normal systolic function with an ejection fraction of 60-65%. The cavity size was normal. There is mildly increased left ventricular wall thickness. Left ventricular diastolic Doppler parameters are consistent with  pseudonormalization No evidence of left ventricular regional wall motion abnormalities.  2. The right ventricle has normal systolic function. The cavity was normal. There is no increase in right ventricular wall thickness.  3. There is severe calcification. There is moderate to severe mitral annular calcification present. Mitral valve regurgitation is mild to moderate by color flow Doppler. Mean gradient across the mitral valve = 21 mmHg. Valve area by PHT is not  significantly decreased, but visually and by mean gradient, the mitral stenosis appears severe.  4. The aortic valve is tricuspid There is severe calcifcation of the aortic valve. There is moderate stenosis of the aortic valve. Mean gradient 34 mmHg with AVA 1.23 cm^2.  5. The pulmonic valve was normal in structure.   6. The aortic root and ascending aorta are normal in size and structure.  7. Left atrial size was severely dilated.  8. Right atrial pressure is estimated at 3 mmHg.  9. PA systolic pressure 34 mmHg.  10. Trivial pericardial effusion.  11. The tricuspid valve is normal in structure.  FINDINGS  Left Ventricle: The left ventricle has normal systolic function, with an ejection fraction of 60-65%. The cavity size was normal. There is mildly increased left ventricular wall thickness. Left ventricular diastolic Doppler parameters are consistent  with pseudonormalization No evidence of left ventricular regional wall motion abnormalities..  Right Ventricle: The right ventricle has normal systolic function. The cavity was normal. There is no increase in right ventricular wall thickness.  Left Atrium: left atrial size was severely dilated  Right Atrium: right atrial size was normal in size Right atrial pressure is estimated at 3 mmHg.  Interatrial Septum: No atrial level shunt detected by color flow Doppler.  Pericardium: Trivial pericardial effusion is present.  Mitral Valve: The mitral valve is degenerative in appearance. There is severe calcification. There is moderate to severe mitral annular calcification present. Mitral valve regurgitation is mild to moderate by color flow Doppler.  Tricuspid Valve: The tricuspid valve is normal in structure. Tricuspid valve regurgitation is trivial by color flow Doppler.  Aortic Valve: The aortic valve is tricuspid Aortic valve regurgitation was not visualized by color flow Doppler. There is moderate stenosis of the aortic valve, with a calculated valve area of 1.09 cm.  Pulmonic Valve: The pulmonic valve was normal in structure. Pulmonic valve regurgitation is not visualized by color flow Doppler.  Aorta: The aortic root and ascending aorta are normal in size and structure.  Venous: The inferior vena cava is normal in size with greater than 50% respiratory  variability.  LEFT VENTRICLE  PLAX 2D (Teich)  LV EF: 82.9 % Diastology  LVIDd: 4.10 cm LV e' lateral: 4.64 cm/s  LVIDs: 2.00 cm LV E/e' lateral: 52.6  LV PW: 1.50 cm LV e' medial: 6.09 cm/s  LV IVS: 1.30 cm LV E/e' medial: 40.1  LVOT diam: 1.95 cm  LV SV: 61 ml  LVOT Area: 2.99 cm  RIGHT VENTRICLE  RV Basal diam:  2.34 cm  RV S prime: 10.70 cm/s  TAPSE (M-mode): 2.1 cm  RVSP: 34.4 mmHg  LEFT ATRIUM Index RIGHT ATRIUM Index  LA diam: 5.70 cm 3.33 cm/m RA Pressure: 3 mmHg  LA Vol (A2C): 131.0 ml 76.46 ml/m RA Area: 14.60 cm  LA Vol (A4C): 153.0 ml 89.30 ml/m RA Volume: 32.10 ml 18.74 ml/m  LA Biplane Vol: 142.0 ml 82.88 ml/m  AORTIC VALVE  AV Area (Vmax): 1.11 cm  AV Area (Vmean): 1.16 cm  AV Area (VTI): 1.09 cm  AV Vmax: 406.67 cm/s  AV Vmean: 264.000 cm/s  AV VTI: 0.931 m  AV Peak Grad: 66.2 mmHg  AV Mean Grad: 33.0 mmHg  LVOT Vmax: 151.00 cm/s  LVOT Vmean: 102.967 cm/s  LVOT VTI: 0.341 m  LVOT/AV VTI ratio: 0.37  AORTA  Ao Root diam: 2.80 cm  MITRAL VALVE TRICUSPID VALVE  MV Area (PHT): 3.91 cm TR Peak grad: 31.4 mmHg  MV Peak grad: 48.5 mmHg TR Vmax: 280.00 cm/s  MV Mean grad: 21.7 mmHg RVSP: 34.4 mmHg  MV Vmax: 3.48 m/s  MV Vmean: 214.3 cm/s  MV VTI: 0.99 m  MV PHT: 56.26 msec  MV Decel Time: 194 msec  MR Peak grad: 146.4 mmHg  MR Mean grad: 98.3 mmHg  MR Vmax: 605.00 cm/s  MR Vmean: 470.3 cm/s  MV E velocity: 244.00 cm/s  MV A velocity: 208.00 cm/s  MV E/A ratio: 1.17  Loralie Champagne MD  Electronically signed by Loralie Champagne MD  Signature Date/Time: 07/03/2018/2:52:07 PM  RIGHT/LEFT HEART CATH AND CORONARY ANGIOGRAPHY  Conclusion  1. No significant CAD.  2. Mildly elevated PCWP.  3. Preserved cardiac output.  4. 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  5. Severe mitral stenosis.  6. Moderate aortic stenosis.   Procedural Details  Technical Details Procedure: Right Heart Cath, Left Heart Cath, Selective Coronary  Angiography  Indication: Assessment of mitral and aortic stenosis.   Procedural Details: The right groin was prepped, draped, and anesthetized with 1% lidocaine. Using the modified Seldinger technique a 5 French sheath was placed in the right femoral artery and a 7 French sheath was placed in the right femoral vein. A Swan-Ganz catheter was used for the right heart catheterization. Standard protocol was followed for recording of right heart pressures and sampling of oxygen saturations. Fick and thermodilution cardiac output was calculated. Standard Judkins catheters were used for selective coronary angiography. The aortic valve was crossed with a straight valve. There were no immediate procedural complications. The patient was transferred to the post catheterization recovery area for further monitoring.   Estimated blood loss <50 mL.   During this procedure medications were administered to achieve and maintain moderate conscious sedation while the patient's heart rate, blood pressure, and oxygen saturation were continuously monitored and I was present face-to-face 100% of this time.  Medications  (Filter: Administrations occurring from 07/24/18 0717 to 07/24/18 0851)          Medication Rate/Dose/Volume Action  Date Time   midazolam (VERSED) injection (mg) 1 mg Given 07/24/18 0742   Total dose as of 09/23/18 1526 1 mg Given 0749   3 mg 1 mg Given 0805   fentaNYL (SUBLIMAZE) injection (mcg) 25 mcg Given 07/24/18 0742   Total dose as of 09/23/18 1526 25 mcg Given 0748   75 mcg 25 mcg Given 0805   lidocaine (PF) (XYLOCAINE) 1 % injection (mL) 10 mL Given 07/24/18 0755   Total dose as of 09/23/18 1526 10 mL Given 8882  20 mL        Heparin (Porcine) in NaCl 1000-0.9 UT/500ML-% SOLN (mL) 500 mL Given 07/24/18 0800   Total dose as of 09/23/18 1526 500 mL Given 0801   1,000 mL        iohexol (OMNIPAQUE) 350 MG/ML injection (mL) 50 mL Given 07/24/18 0830   Total  dose as of 09/23/18 1526        50 mL        fentaNYL (SUBLIMAZE) injection (mcg) 25 mcg Given 07/24/18 0820   Total dose as of 09/23/18 1526        25 mcg        midazolam (VERSED) injection (mg) 1 mg Given 07/24/18 0820   Total dose as of 09/23/18 1526        1 mg        Sedation Time  Sedation Time Physician-1: 56 minutes 24 seconds  Coronary Findings  Diagnostic  Dominance: Right  Left Main  No significant disease.  Left Anterior Descending  Luminal irregularities.  Left Circumflex  Luminal irregularities.  Right Coronary Artery  Luminal irregularities.  Intervention  No interventions have been documented.  Right Heart  Right Heart Pressures RHC Procedural Findings: Hemodynamics (mmHg) RA mean 4 RV 74/3 PA 64/21, mean 42 PCWP mean 19 LV 169/10 AO 158/73  Oxygen saturations: PA 67% AO 99%  Cardiac Output (Fick) 6.15  Cardiac Index (Fick) 3.64 PVR 3.7 WU  Cardiac Output (Thermo) 7.15 Cardiac Index (Thermo) 4.23  PVR 3.2 WU  Mitral valve:  Mean gradient 22 mmHg MVA (Fick) 1.38 cm^2 MVA (Thermo) 1.6 cm^2  Aortic valve:  Mean gradient 10 mmHg, AVA 1.73 cm^2 (simultaneous FA/LV) Mean gradient 24 mmHg, AVA 1.41 cm^2 (pull back)  Implants     No implant documentation for this case.  Syngo Images  Link to Procedure Log   Show images for CARDIAC CATHETERIZATION Procedure Log  Images on Long Term Storage    Show images for Sunny, Aguon   [facility] Data   Most Recent Value  Fick Cardiac Output 6.15 L/min  Fick Cardiac Output Index 3.64 (L/min)/BSA  Thermal Cardiac Output 7.15 L/min  Thermal Cardiac Output Index 4.23 (L/min)/BSA  Aortic Mean Gradient 9.95 mmHg  Aortic Peak Gradient 5 mmHg  Aortic Valve Area 2.01  Aortic Value Area Index 1.19 cm2/BSA  Mitral Mean Gradient 21.87 mmHg  Mitral Peak Gradient 16 mmHg  Mitral Valve Area Index 0.95 cm2/BSA  RA A Wave 7 mmHg  RA V Wave 6 mmHg  RA Mean 4 mmHg  RV Systolic  Pressure 74 mmHg  RV Diastolic Pressure 0 mmHg  RV EDP 3 mmHg  PA Systolic Pressure 64 mmHg  PA Diastolic Pressure 21 mmHg  PA Mean 42 mmHg  PW A Wave 25 mmHg  PW V Wave 15 mmHg  PW Mean 24 mmHg  AO Systolic Pressure 161 mmHg  AO Diastolic Pressure 73 mmHg  AO Mean 096 mmHg  LV Systolic Pressure 045 mmHg  LV Diastolic Pressure 4 mmHg  LV EDP 9 mmHg  AOp Systolic Pressure 409 mmHg  AOp Diastolic Pressure 72 mmHg  AOp Mean Pressure 811 mmHg  LVp Systolic Pressure 914 mmHg  LVp Diastolic Pressure 4 mmHg  LVp EDP Pressure 12 mmHg  TPVR Index 9.93 HRUI  TSVR Index 26.23 HRUI  PVR SVR Ratio 0.21  TPVR/TSVR Ratio 0.38     RIGHT/LEFT HEART CATH AND CORONARY ANGIOGRAPHY  Conclusion  1. No significant CAD.  2. Mildly elevated PCWP.  3. Preserved cardiac output.  4. 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  5. Severe mitral stenosis.  6. Moderate aortic stenosis.   Procedural Details  Technical Details Procedure: Right Heart Cath, Left Heart Cath, Selective Coronary Angiography  Indication: Assessment of mitral and aortic stenosis.   Procedural Details: The right groin was prepped, draped, and anesthetized with 1% lidocaine. Using the modified Seldinger technique a 5 French sheath was placed in the right femoral artery and a 7 French sheath was placed in the right femoral vein. A Swan-Ganz catheter was used for the right heart catheterization. Standard protocol was followed for recording of right heart pressures and sampling of oxygen saturations. Fick and thermodilution cardiac output was calculated. Standard Judkins catheters were used for selective coronary angiography. The aortic valve was crossed with a straight valve. There were no immediate procedural complications. The patient was transferred to the post catheterization recovery area for further monitoring.   Estimated blood loss <50 mL.   During this procedure medications were administered to  achieve and maintain moderate conscious sedation while the patient's heart rate, blood pressure, and oxygen saturation were continuously monitored and I was present face-to-face 100% of this time.  Medications  (Filter: Administrations occurring from 07/24/18 0717 to 07/24/18 0851)          Medication Rate/Dose/Volume Action  Date Time   midazolam (VERSED) injection (mg) 1 mg Given 07/24/18 0742   Total dose as of 09/23/18 1639 1 mg Given 0749   3 mg 1 mg Given 0805   fentaNYL (SUBLIMAZE) injection (mcg) 25 mcg Given 07/24/18 0742   Total dose as of 09/23/18 1639 25 mcg Given 0748   75 mcg 25 mcg Given 0805   lidocaine (PF) (XYLOCAINE) 1 % injection (mL) 10 mL Given 07/24/18 0755   Total dose as of 09/23/18 1639 10 mL Given 0755   20 mL        Heparin (Porcine) in NaCl 1000-0.9 UT/500ML-% SOLN (mL) 500 mL Given 07/24/18 0800   Total dose as of 09/23/18 1639 500 mL Given 0801   1,000 mL        iohexol (OMNIPAQUE) 350 MG/ML injection (mL) 50 mL Given 07/24/18 0830   Total dose as of 09/23/18 1639        50 mL        fentaNYL (SUBLIMAZE) injection (mcg) 25 mcg Given 07/24/18 0820   Total dose as of 09/23/18 1639        25 mcg        midazolam (VERSED) injection (mg) 1 mg Given 07/24/18 0820   Total dose as of 09/23/18 1639        1 mg        Sedation Time  Sedation Time Physician-1: 56 minutes 24 seconds  Coronary Findings  Diagnostic  Dominance: Right  Left Main  No significant disease.  Left Anterior Descending  Luminal irregularities.  Left Circumflex  Luminal irregularities.  Right Coronary Artery  Luminal irregularities.  Intervention  No interventions have been documented.  Right Heart  Right Heart Pressures RHC Procedural Findings: Hemodynamics (mmHg) RA mean 4 RV 74/3 PA 64/21, mean 42 PCWP mean 19 LV 169/10 AO 158/73  Oxygen saturations: PA 67% AO 99%  Cardiac Output (Fick) 6.15    Cardiac Index (Fick) 3.64 PVR 3.7 WU  Cardiac Output (Thermo) 7.15 Cardiac Index (Thermo) 4.23  PVR 3.2 WU  Mitral valve:  Mean gradient 22 mmHg MVA (Fick) 1.38 cm^2 MVA (Thermo) 1.6 cm^2  Aortic valve:  Mean gradient 10 mmHg, AVA 1.73 cm^2 (simultaneous FA/LV) Mean gradient 24 mmHg, AVA 1.41 cm^2 (pull back)  Implants     No implant documentation for this case.  Syngo Images  Link to Procedure Log   Show images for CARDIAC CATHETERIZATION Procedure Log  Images on Long Term Storage    Show images for Canisha, Issac   [facility] Data   Most Recent Value  Fick Cardiac Output 6.15 L/min  Fick Cardiac Output Index 3.64 (L/min)/BSA  Thermal Cardiac Output 7.15 L/min  Thermal Cardiac Output Index 4.23 (L/min)/BSA  Aortic Mean Gradient 9.95 mmHg  Aortic Peak Gradient 5 mmHg  Aortic Valve Area 2.01  Aortic Value Area Index 1.19 cm2/BSA  Mitral Mean Gradient 21.87 mmHg  Mitral Peak Gradient 16 mmHg  Mitral Valve Area Index 0.95 cm2/BSA  RA A Wave 7 mmHg  RA V Wave 6 mmHg  RA Mean 4 mmHg  RV Systolic Pressure 74 mmHg  RV Diastolic Pressure 0 mmHg  RV EDP 3 mmHg  PA Systolic Pressure 64 mmHg  PA Diastolic Pressure 21 mmHg  PA Mean 42 mmHg  PW A Wave 25 mmHg  PW V Wave 15 mmHg  PW Mean 24 mmHg  AO Systolic Pressure 626 mmHg  AO Diastolic Pressure 73 mmHg  AO Mean 948 mmHg  LV Systolic Pressure 546 mmHg  LV Diastolic Pressure 4 mmHg  LV EDP 9 mmHg  AOp Systolic Pressure 270 mmHg  AOp Diastolic Pressure 72 mmHg  AOp Mean Pressure 350 mmHg  LVp Systolic Pressure 093 mmHg  LVp Diastolic Pressure 4 mmHg  LVp EDP Pressure 12 mmHg  TPVR Index 9.93 HRUI  TSVR Index 26.23 HRUI  PVR SVR Ratio 0.21  TPVR/TSVR Ratio 0.38     Cardiac TAVR CT  TECHNIQUE: The patient was scanned on a [employer]. A 120 kV retrospective scan was triggered in the descending thoracic aorta at 111 HU's. Gantry rotation speed was 250 msecs and collimation was .6 mm. No beta  blockade or nitro were given. The 3D data set was reconstructed in 5% intervals of the R-R cycle. Systolic and diastolic phases were analyzed on a dedicated work station using MPR, MIP and VRT modes. The patient received 80 cc of contrast.  FINDINGS: Aortic Valve: Trileaflet aortic valve with moderate leaflet thickening, mild calcifications, moderate leaflet opening restrictions and only minimal calcifications extending into the LVOT.  Aorta: Normal size with mild atherosclerotic plaque and calcifications in the thoracic aorta and moderate to severe, almost circumferential calcifications in the distal abdominal aorta extending into iliofemoral arteries bilaterally  Sinotubular Junction: 28 x 28 mm  Ascending Thoracic Aorta: 35 x 34 mm  Aortic Arch: 30 x 28 mm  Descending Thoracic Aorta: 24 x 24 mm  Sinus of Valsalva Measurements:  Non-coronary: 30 mm  Right -coronary: 27 mm  Left -coronary: 31 mm  Coronary Artery Height above Annulus:  Left Main: 8 mm  Right Coronary: 14 mm  Virtual Basal Annulus Measurements:  Maximum/Minimum Diameter: 27.2 x 18.8 mm  Mean Diameter: 22.2 mm  Perimeter: 72.1 mm  Area: 388 mm2  Optimum Fluoroscopic Angle for Delivery: LAO 8 CAU 6  IMPRESSION: 1. Trileaflet aortic valve with moderate leaflet thickening, mild calcifications, moderate leaflet opening restrictions and only minimal calcifications extending into the LVOT. Annular measurements are suitable for delivery of a 23 mm Edwards-SAPIEN 3 valve.  2. Sufficient coronary to annulus distance.  3. Optimum Fluoroscopic Angle for Delivery: LAO 8 CAU 6.  4. Aorta: Normal size with mild atherosclerotic plaque and calcifications in the thoracic aorta and moderate to severe, almost circumferential calcifications in the distal abdominal aorta extending into iliofemoral arteries bilaterally  5. No thrombus in the left atrial appendage.  6. Severe  circumferential mitral annular calcifications.  Electronically Signed: By: Ena Dawley On: 10/30/2018 14:36   Treatments: Surgery  CARDIOTHORACIC SURGERY OPERATIVE NOTE  Date of Procedure:                12/02/2018  Preoperative Diagnosis:        Rheumatic Heart Disease  Moderate Aortic Stenosis  Severe Mitral Stenosis   Postoperative Diagnosis:    Same   Procedure:        Aortic Valve Replacement             Sorin [employer] Top-Hat bileaflet mechanical valve (size 21 mm, cat # [id number], serial # [id number])   Mitral Valve Replacement             Sorin [employer] Optiform bileaflet mechanical valve (size 31 mm, cat # [id number], serial # [id number])              Surgeon:        Valentina Gu. Roxy Manns, MD  Assistant:       B. Murvin Natal, MD  Anesthesia:    Albertha Ghee, MD  Operative Findings: ? Rheumatic heart disease ? Moderate aortic stenosis ? Severe mitral stenosis ? Normal left ventricular systolic function ? Moderate left ventricular hypertrophy ? Normal right ventricular function ? Moderate pulmonary hypertension            BRIEF CLINICAL NOTE AND INDICATIONS FOR SURGERY  Patient is a 58 year old African-American female with history of aortic stenosis, mitral stenosis, end-stage renal disease on hemodialysis, hypertension, lupus, remote history of stroke, dysfunctional uterine bleeding, peptic ulcer disease, and chronic anemia who has been referred for surgical consultation to discuss treatment options for management of aortic stenosis and mitral stenosis.  Patient's cardiac history dates back to 2016 when she was hospitalized with acute hypoxic respiratory failure and flash pulmonary edema in the setting of influenza A pneumonia and hypertensive urgency. She was treated with dialysis and Tamiflu and recovered. Cardiac enzymes were elevated at that time. Echocardiogram revealed normal left ventricular function with moderate  mitral stenosis and mild aortic stenosis. She has been followed intermittently ever since by Dr. Harrington Challenger. She has had numerous hospitalizations over the last several years for a variety of problems. June 2019 she was hospitalized with respiratory distress and volume overload. Apparently she was in rapid atrial fibrillation for a brief period of time. Transesophageal echocardiogram performed January 02, 2018 revealed normal left ventricular systolic function with moderate aortic stenosis. Findings were suggestive of possible bicuspid aortic valve. There was felt to be "at least moderate mitral stenosis".Mean transvalvular gradient across the mitral valve was estimated 16 mmHg with mitral valve area by pressure half-time calculated 2.8 cm. By planimetry the valve area measured 1.5 cm. There was mild to moderate mitral regurgitation and heavy calcification of the mitral valve with severely restricted leaflet mobility. She was treated medically and seen mostrecently in follow-up by Dr. Harrington Challenger on July 03, 2018. Follow-up echocardiogram performed at that time revealed increased transvalvular gradients across both the mitral and aortic valves and findings suggestive of high filling pressures. Peak transvalvular gradient across the aortic valve measured 4.1 m/s corresponding to mean transvalvular gradient estimated 33 mmHg and aortic valve area calculated 1.1 cm. The DVI was relatively high  and reported 0.37. Mean transvalvular gradient across the mitral valve was reported 21.7 mmHg but the mitral valve area calculated based on pressure half-time was reported 3.91 cm. Left ventricular systolic function remain normal with ejection fraction estimated 60 to 65%. The patient subsequently underwent left and right heart catheterization on July 24, 2018. She is found to have normal coronary artery anatomy with no significant coronary artery disease. There was moderate mixed pulmonary arterial and pulmonary  venous hypertension with PA pressures measured 64/21. There was mildly elevated pulmonary capillary wedge pressure (19 mmHg) and highnormal cardiac output (6.15 L/min). There was severe mitral stenosis with mean transvalvular gradient estimated 22 mmHg. There was moderate aortic stenosis with mean transvalvular gradients measured 10 mmHg using simultaneous waveforms from the femoral artery to the left ventricle and 24 mmHg on pullback gradient. Cardiothoracic surgical consultation was requested.  The patient has been seen in consultation and counseled at length regarding the indications, risks and potential benefits of surgery.  All questions have been answered, and the patient provides full informed consent for the operation as described  Discharge Exam: Blood pressure (!) 145/82, pulse 98, temperature 97.9 F (36.6 C), temperature source Oral, resp. rate 18, height 5\' 6"  (1.676 m), weight 61.5 kg, last menstrual period 12/05/2010, SpO2 100 %.  Physical Exam General appearance:alert, cooperative and no distress Neurologic:intact Heart:regular rate and rhythm Lungs:clear to auscultation bilaterally ABD: soft and not tender. Extremities:No LE edema, all well perfused. Wound:The sternal incision and CT sites are dry and intact.   Disposition:    Allergies as of 12/11/2018   No Known Allergies     Medication List    STOP taking these medications   hydrALAZINE 25 MG tablet Commonly known as: APRESOLINE   ibuprofen 200 MG tablet Commonly known as: ADVIL   oxyCODONE-acetaminophen 7.5-325 MG tablet Commonly known as: Percocet     TAKE these medications   amLODipine 10 MG tablet Commonly known as: NORVASC Take 1 tablet (10 mg total) by mouth at bedtime.   aspirin EC 81 MG tablet Take 81 mg by mouth at bedtime.   Auryxia 1 GM 210 MG(Fe) tablet Generic drug: ferric citrate Take 840 mg by mouth 3 (three) times daily with meals.   calcitRIOL 0.25 MCG capsule Commonly  known as: ROCALTROL Take 7 capsules (1.75 mcg total) by mouth every other day. What changed:   when to take this  additional instructions   cloNIDine 0.2 MG tablet Commonly known as: CATAPRES Take 1 tablet (0.2 mg) by mouth twice daily - on dialysis days take after dialysis and at bedtime, on other days take one in the morning and one at night   labetalol 300 MG tablet Commonly known as: NORMODYNE Take 1 tablet (300 mg total) by mouth 2 (two) times daily.   lanthanum 1000 MG chewable tablet Commonly known as: FOSRENOL Chew 2 tablets (2,000 mg total) by mouth 3 (three) times daily with meals.   multivitamin Tabs tablet Take 1 tablet by mouth at bedtime.   ondansetron 4 MG tablet Commonly known as: ZOFRAN Take 4 mg by mouth every 6 (six) hours as needed for nausea or vomiting.   pantoprazole 40 MG tablet Commonly known as: PROTONIX Take 40 mg by mouth 2 (two) times daily.   predniSONE 10 MG tablet Commonly known as: DELTASONE Take 10 mg by mouth daily with breakfast.   traMADol 50 MG tablet Commonly known as: ULTRAM Take 1 tablet (50 mg total) by mouth every 4 (four) hours as  needed for moderate pain.   warfarin 3 MG tablet Commonly known as: Coumadin Take 1 tablet (3 mg total) by mouth daily.            Durable Medical Equipment  (From admission, onward)         Start     Ordered   12/11/18 0925  For home use only DME oxygen  Once    Question Answer Comment  Length of Need 6 Months   Mode or (Route) Nasal cannula   Liters per Minute 2   Frequency Continuous (stationary and portable oxygen unit needed)   Oxygen conserving device No   Oxygen delivery system Gas      12/11/18 0925         Follow-up Information    Leanor Kail, PA Follow up on 12/16/2018.   Specialty: Cardiology Why: You have a cardiology follow up appointment with Robbie Lis, PA-C on Tuesday, 12/16/18 at 8:45.  Contact information: [address] [place]  [address] [phone]        [place] [facility] Follow up on 12/11/2018.   Specialty: Cardiology Why: You have an appointment at [facility] on Tuesday 12/16/18 at 2:30pm for a PT/INR blood test.  Contact information: [address] [place]       Rexene Alberts, MD. Go on 12/29/2018.   Specialty: Cardiothoracic Surgery Why: You have an appointment with Dr. Roxy Manns on Monday 12/29/18 at 3:30pm.  Please arrive 30 minutes early for a chest x-ray to be performed by [facility] located on the first floor of the same building. Contact information: [address] [place] [address] [phone]        [facility]. Go on 12/18/2018.   Specialty: Cardiothoracic Surgery Why: You have an appointement for a nurse visit at Dr. Guy Sandifer office on Thursday 12/18/18 at 11am for suture removal.  Contact information: [place], [place] [phone]         The patient has been discharged on:   1.Beta Blocker:  Yes [ x  ]                              No   [   ]                              If No, reason:  2.Ace Inhibitor/ARB: Yes [   ]                                     No  [ x   ]                                     If No, reason:  Renal failure  3.Statin:   Yes [   ]                  No  [x ]                  If No, reason: No CAD  4.Shela CommonsVelta Addison  [ x  ]  No   [   ]                  If No, reason:    Signed: Antony Odea, PA-C 12/11/2018, 9:29 AM

## 2018-12-03 NOTE — Progress Notes (Signed)
Subjective:  Extubated and off drips this AM- is in pain not surprisingly  Objective Vital signs in last 24 hours: Vitals:   12/03/18 0400 12/03/18 0500 12/03/18 0600 12/03/18 0700  BP: (!) 143/72 133/76 115/71 133/65  Pulse: 62 66 62 65  Resp: 20 11 11 15   Temp: (!) 95.9 F (35.5 C) (!) 96.3 F (35.7 C) (!) 96.4 F (35.8 C) (!) 96.6 F (35.9 C)  TempSrc: Core     SpO2: 99% 99% 99% 99%  Weight:  67.7 kg    Height:       Weight change: 4.65 kg  Intake/Output Summary (Last 24 hours) at 12/03/2018 0801 Last data filed at 12/03/2018 0700 Gross per 24 hour  Intake 4395.32 ml  Output 1801 ml  Net 2594.32 ml    Dialysis Orders:  MWF - [facility]  3.5hrs, BFR 450, DFR 800,  [name] 61.5kg, 2K/ 2Ca  Access: RU AVF  Heparin 5000 unit bolus Mircera 200 mcg q2wks - last on 7/3 Calcitriol 3 mcg PO qHD  Assessment/Plan: 1.  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 Per CTS.  Extubated and off drips- doing well 2.  ESRD -  Normally On HD MWF. K 4.3 pre surgery.    Due for HD today- potassium is kind of forcing our hand but since is off drips will go ahead and do gentle short tx today with the interest of bringing K down 3.  Hypertension/Volume -  Does not appear volume overloaded on exam but very positive.  Min removal with HD today  4.  Anemia of CKD - Hgb 8.7. Due for ESA on Friday.  Follow trend post surgery and transfuse as indicated.  5.  Secondary Hyperparathyroidism -  OP Ca at goal. Phos elevated.  Cont calcitriol and fosrenol 6.  Nutrition - Renal diet once advanced, Vit 7. Pulmonary HTN 8.  Hyperkalemia-  HD today to fix     [facility]    Labs: Basic Metabolic Panel: Recent Labs  Lab 11/27/18 1225  12/02/18 1241  12/02/18 1829 12/02/18 1917 12/03/18 0359  NA 133*   < > 135   < > 135 136 136  K 4.6   < > 4.3   < > 5.1 5.4* 5.9*  CL 94*  --   --   --   --  101 100  CO2 18*  --   --   --   --  23 19*  GLUCOSE 94   < > 93  --   --  89 89  BUN  59*  --   --   --   --  45* 54*  CREATININE 7.15*  --   --   --   --  6.51* 6.99*  CALCIUM 8.9  --   --   --   --  8.3* 8.7*   < > = values in this interval not displayed.   Liver Function Tests: Recent Labs  Lab 11/27/18 1225  AST 21  ALT 17  ALKPHOS 60  BILITOT 0.3  PROT 6.4*  ALBUMIN 3.2*   No results for input(s): LIPASE, AMYLASE in the last 168 hours. No results for input(s): AMMONIA in the last 168 hours. CBC: Recent Labs  Lab 11/27/18 1225  12/02/18 1415  12/02/18 1829 12/02/18 1917 12/03/18 0359  WBC 5.7  --  5.5  --   --  12.4* 10.4  HGB 11.5*   < > 8.4*   < >  9.5* 9.0* 8.7*  HCT 36.6   < > 27.3*   < > 28.0* 29.0* 29.2*  MCV 102.8*  --  105.8*  --   --  105.8* 107.0*  PLT 188   < > 102*  --   --  157 145*   < > = values in this interval not displayed.   Cardiac Enzymes: No results for input(s): CKTOTAL, CKMB, CKMBINDEX, TROPONINI in the last 168 hours. CBG: Recent Labs  Lab 12/02/18 1644 12/02/18 1747 12/02/18 2010 12/02/18 2358 12/03/18 0435  GLUCAP 101* 108* 78 85 75    Iron Studies: No results for input(s): IRON, TIBC, TRANSFERRIN, FERRITIN in the last 72 hours. Studies/Results: Dg Chest Port 1 View  Result Date: 12/02/2018 CLINICAL DATA:  Postop film for valve replacement surgery. EXAM: PORTABLE CHEST 1 VIEW COMPARISON:  Chest x-ray dated 11/27/2018. FINDINGS: Endotracheal tube appears well positioned with tip above the level of the carina. Enteric tube passes below the diaphragm. Swan-Ganz catheter in place with tip approximately 6 cm to the RIGHT of midline. Mediastinal drains in place. New valve in place. Mild central pulmonary vascular congestion. Probable mild bibasilar atelectasis and/or small pleural effusions. Small RIGHT apical pneumothorax. Bilateral chest tubes in place. IMPRESSION: 1. Tip of Swan-Ganz catheter is approximately 6 cm to the RIGHT of midline. For optimal radiographic positioning, would consider retracting to a position closer to  the midline. 2. Small RIGHT apical pneumothorax. Bilateral chest tubes in place. 3. Mild central pulmonary vascular congestion. Probable mild bibasilar atelectasis and/or small pleural effusions. These results will be called to the ordering clinician or representative by the Radiologist Assistant, and communication documented in the PACS or zVision Dashboard. Electronically Signed   By: Franki Cabot M.D.   On: 12/02/2018 14:45   Medications: Infusions: . sodium chloride    . albumin human    . cefUROXime (ZINACEF)  IV    . nitroGLYCERIN Stopped (12/02/18 1824)  . phenylephrine (NEO-SYNEPHRINE) Adult infusion Stopped (12/02/18 1635)    Scheduled Medications: . acetaminophen  1,000 mg Oral Q6H  . aspirin EC  325 mg Oral Daily  . bisacodyl  10 mg Oral Daily   Or  . bisacodyl  10 mg Rectal Daily  . [START ON 12/04/2018] calcitRIOL  1.75 mcg Oral QODAY  . Chlorhexidine Gluconate Cloth  6 each Topical Daily  . cloNIDine  0.2 mg Transdermal Weekly  . docusate sodium  200 mg Oral Daily  . insulin aspart  0-24 Units Subcutaneous Q4H  . [START ON 12/04/2018] lanthanum  2,000 mg Oral TID WC  . mouth rinse  15 mL Mouth Rinse BID  . multivitamin  1 tablet Oral QHS  . pantoprazole  40 mg Oral BID  . [START ON 12/04/2018] predniSONE  10 mg Oral Q breakfast  . sodium chloride flush  3 mL Intravenous Q12H  . warfarin  2.5 mg Oral q1800  . Warfarin - Physician Dosing Inpatient   Does not apply q1800    have reviewed scheduled and prn medications.  Physical Exam: General: eyes closed, sitting still, minimal conversation Heart: RRR Lungs: poor effort Abdomen: soft Extremities: min edema Dialysis Access: right AVF- patent     12/03/2018,8:01 AM  LOS: 1 day

## 2018-12-03 NOTE — Progress Notes (Signed)
Renal Navigator notified OP HD clinic/[facility] of patient's admission and negative COVID 19 test result in order to provide continuity of care.  Raha Tennison Elizabeth, LCSW Renal Navigator [phone] 

## 2018-12-03 NOTE — Progress Notes (Signed)
Pt's mother called this RN for an update. Update was given & white corded phone was placed in pt's room so she can call her mother as she pleases. Pt's mother was appreciative of help & care provided for the pt.

## 2018-12-03 NOTE — Progress Notes (Addendum)
[facility] DAILY ICU PROGRESS NOTE                   [address]            [place] [address]          [id number]   1 Day Post-Op Procedure(s) (LRB): AORTIC VALVE REPLACEMENT (AVR) USING CARBOMEDICS SUPRA-ANNULAR TOP HAT SIZE 21MM (N/A) MITRAL VALVE (MV) REPLACEMENT USING CARBOMEDICS OPTIFORM SIZE 31MM (N/A) TRANSESOPHAGEAL ECHOCARDIOGRAM (TEE) (N/A)  Total Length of Stay:  LOS: 1 day   Subjective: Extubated ~5:40 pm yesterday.  Awake and alert with expected soreness.  Hemodynamics stable since surgery.    Objective: Vital signs in last 24 hours: Temp:  [94.5 F (34.7 C)-97.9 F (36.6 C)] 96.6 F (35.9 C) (07/15 0700) Pulse Rate:  [55-84] 65 (07/15 0700) Cardiac Rhythm: Normal sinus rhythm (07/15 0400) Resp:  [9-31] 15 (07/15 0700) BP: (76-143)/(36-85) 133/65 (07/15 0700) SpO2:  [91 %-100 %] 99 % (07/15 0700) Arterial Line BP: (83-191)/(40-84) 137/56 (07/15 0700) FiO2 (%):  [40 %-50 %] 40 % (07/14 1705) Weight:  [67.7 kg] 67.7 kg (07/15 0500)  Filed Weights   12/02/18 0558 12/02/18 0559 12/03/18 0500  Weight: 63 kg 63 kg 67.7 kg    Weight change: 4.65 kg   Hemodynamic parameters for last 24 hours: PAP: (18-97)/(15-45) 65/21 CO:  [3.9 L/min-4.1 L/min] 4.1 L/min CI:  [2.3 L/min/m2-2.4 L/min/m2] 2.4 L/min/m2  Intake/Output from previous day: 07/14 0701 - 07/15 0700 In: 4395.3 [P.O.:120; I.V.:2695.3; Blood:680; IV Piggyback:900.1] Out: 1801 [Urine:5; Blood:710; Chest Tube:1086]  Intake/Output this shift: No intake/output data recorded.  Current Meds: Scheduled Meds: . acetaminophen  1,000 mg Oral Q6H  . aspirin EC  325 mg Oral Daily  . bisacodyl  10 mg Oral Daily   Or  . bisacodyl  10 mg Rectal Daily  . [START ON 12/04/2018] calcitRIOL  1.75 mcg Oral QODAY  . Chlorhexidine Gluconate Cloth  6 each Topical Daily  . cloNIDine  0.2 mg Transdermal Weekly  . docusate sodium  200 mg Oral Daily  . hydrocortisone sodium succinate  50 mg Intravenous  Q6H  . insulin aspart  0-24 Units Subcutaneous Q4H  . [START ON 12/04/2018] lanthanum  2,000 mg Oral TID WC  . mouth rinse  15 mL Mouth Rinse BID  . multivitamin  1 tablet Oral QHS  . pantoprazole  40 mg Oral BID  . [START ON 12/04/2018] predniSONE  10 mg Oral Q breakfast  . sodium chloride flush  3 mL Intravenous Q12H  . warfarin  2.5 mg Oral q1800  . Warfarin - Physician Dosing Inpatient   Does not apply q1800   Continuous Infusions: . sodium chloride    . albumin human    . cefUROXime (ZINACEF)  IV    . nitroGLYCERIN Stopped (12/02/18 1824)  . phenylephrine (NEO-SYNEPHRINE) Adult infusion Stopped (12/02/18 1635)   PRN Meds:.albumin human, labetalol, metoprolol tartrate, morphine injection, ondansetron (ZOFRAN) IV, oxyCODONE, sodium chloride flush, traMADol  General appearance: alert, cooperative and mild distress Neurologic: intact Heart: regular rate and rhythm Lungs: Breath sounds clear anterior. CT drainage 20-29ml/hr.  Abdomen: Absent bowel sounds. Soft and non-tender. Extremities: No peripheral edema. Wound: Sternotomy covered with an intact Aquacel dressing.   Lab Results: CBC: Recent Labs    12/02/18 1917 12/03/18 0359  WBC 12.4* 10.4  HGB 9.0* 8.7*  HCT 29.0* 29.2*  PLT 157 145*   BMET:  Recent Labs    12/02/18 1917 12/03/18 0359  NA 136 136  K 5.4* 5.9*  CL 101 100  CO2 23 19*  GLUCOSE 89 89  BUN 45* 54*  CREATININE 6.51* 6.99*  CALCIUM 8.3* 8.7*    CMET: Lab Results  Component Value Date   WBC 10.4 12/03/2018   HGB 8.7 (L) 12/03/2018   HCT 29.2 (L) 12/03/2018   PLT 145 (L) 12/03/2018   GLUCOSE 89 12/03/2018   CHOL 187 02/23/2016   TRIG 89 02/23/2016   HDL 55 02/23/2016   LDLCALC 114 02/23/2016   ALT 17 11/27/2018   AST 21 11/27/2018   NA 136 12/03/2018   K 5.9 (H) 12/03/2018   CL 100 12/03/2018   CREATININE 6.99 (H) 12/03/2018   BUN 54 (H) 12/03/2018   CO2 19 (L) 12/03/2018   TSH 0.711 07/26/2014   INR 1.5 (H) 12/02/2018   HGBA1C  4.9 11/27/2018      PT/INR:  Recent Labs    12/02/18 1415  LABPROT 17.9*  INR 1.5*   Radiology: Dg Chest Port 1 View  Result Date: 12/02/2018 CLINICAL DATA:  Postop film for valve replacement surgery. EXAM: PORTABLE CHEST 1 VIEW COMPARISON:  Chest x-ray dated 11/27/2018. FINDINGS: Endotracheal tube appears well positioned with tip above the level of the carina. Enteric tube passes below the diaphragm. Swan-Ganz catheter in place with tip approximately 6 cm to the RIGHT of midline. Mediastinal drains in place. New valve in place. Mild central pulmonary vascular congestion. Probable mild bibasilar atelectasis and/or small pleural effusions. Small RIGHT apical pneumothorax. Bilateral chest tubes in place. IMPRESSION: 1. Tip of Swan-Ganz catheter is approximately 6 cm to the RIGHT of midline. For optimal radiographic positioning, would consider retracting to a position closer to the midline. 2. Small RIGHT apical pneumothorax. Bilateral chest tubes in place. 3. Mild central pulmonary vascular congestion. Probable mild bibasilar atelectasis and/or small pleural effusions. These results will be called to the ordering clinician or representative by the Radiologist Assistant, and communication documented in the PACS or zVision Dashboard. Electronically Signed   By: Franki Cabot M.D.   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  Hemodynamics stable since surgery, on no inotropic or pressor support currently.   Mobilize, begin anticoagulation with Coumadin. Will leave PA catheter and a-line until after first HD treatment complete.  Leave CT's for drainage.   -ESRD on HD (M-W-F) - mild volume overload, net positive by 2.5L.  K+ 5.9. OK to proceed with hemodialysis today from CTS standpoint.   -Expected acute blood loss anemia. Mild with minimal ongoing losses. Monitor.  --Thrombocytopenia- mild, follow trend.   -Arthritis- treated with chronic oral steroid. 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 Has PRN labetalol as well. Will defer mgt to nephrology.   -DVT PPX--plan to add enoxaparin after CT's removed.    Antony Odea , PA-C [phone] 12/03/2018 7:35 AM   I have seen and examined the patient and agree with the assessment and plan as outlined.  Doing well POD1.  Maintaining NSR w/ stable hemodynamics, no drips other than intermittent NTG for hypertension.  1st degree AVB and new LBBB on EKG -  hold beta blockers.  Potassium 5.9 - may need gentle HD today.  Mobilize.  D/C swan-ganz.  D/C Aline if stable after HD.  Leave chest tubes in for now.  Start Coumadin slowly.  Rexene Alberts, MD 12/03/2018 8:37 AM

## 2018-12-03 NOTE — Anesthesia Postprocedure Evaluation (Signed)
Anesthesia Post Note  Patient: [name]  Procedure(s) Performed: AORTIC VALVE REPLACEMENT (AVR) USING CARBOMEDICS SUPRA-ANNULAR TOP HAT SIZE 21MM (N/A Chest) MITRAL VALVE (MV) REPLACEMENT USING CARBOMEDICS OPTIFORM SIZE 31MM (N/A Chest) TRANSESOPHAGEAL ECHOCARDIOGRAM (TEE) (N/A )     Patient location during evaluation: SICU Anesthesia Type: General Level of consciousness: sedated Pain management: pain level controlled Vital Signs Assessment: post-procedure vital signs reviewed and stable Respiratory status: patient remains intubated per anesthesia plan Cardiovascular status: stable Postop Assessment: no apparent nausea or vomiting Anesthetic complications: no    Last Vitals:  Vitals:   12/03/18 0837 12/03/18 0900  BP:  (!) 150/65  Pulse:  69  Resp:  18  Temp: 36.5 C   SpO2:  100%    Last Pain:  Vitals:   12/03/18 0837  TempSrc: Oral  PainSc:                  [place] S

## 2018-12-04 ENCOUNTER — Inpatient Hospital Stay (HOSPITAL_COMMUNITY): Payer: Medicare Other

## 2018-12-04 LAB — CBC
HCT: 27.2 % — ABNORMAL LOW (ref 36.0–46.0)
Hemoglobin: 8.4 g/dL — ABNORMAL LOW (ref 12.0–15.0)
MCH: 32.3 pg (ref 26.0–34.0)
MCHC: 30.9 g/dL (ref 30.0–36.0)
MCV: 104.6 fL — ABNORMAL HIGH (ref 80.0–100.0)
Platelets: 119 10*3/uL — ABNORMAL LOW (ref 150–400)
RBC: 2.6 MIL/uL — ABNORMAL LOW (ref 3.87–5.11)
RDW: 18.4 % — ABNORMAL HIGH (ref 11.5–15.5)
WBC: 9.1 10*3/uL (ref 4.0–10.5)
nRBC: 1 % — ABNORMAL HIGH (ref 0.0–0.2)

## 2018-12-04 LAB — GLUCOSE, CAPILLARY
Glucose-Capillary: 84 mg/dL (ref 70–99)
Glucose-Capillary: 124 mg/dL — ABNORMAL HIGH (ref 70–99)
Glucose-Capillary: 95 mg/dL (ref 70–99)
Glucose-Capillary: 101 mg/dL — ABNORMAL HIGH (ref 70–99)

## 2018-12-04 LAB — PROTIME-INR
INR: 1.2 (ref 0.8–1.2)
Prothrombin Time: 14.9 seconds (ref 11.4–15.2)

## 2018-12-04 MED ORDER — ENOXAPARIN SODIUM 30 MG/0.3ML ~~LOC~~ SOLN
30.0000 mg | SUBCUTANEOUS | Status: DC
  Administered 2018-12-05 – 2018-12-07 (×3): 30 mg via SUBCUTANEOUS
  Filled 2018-12-04 (×3): qty 0.3

## 2018-12-04 MED ORDER — AMLODIPINE BESYLATE 10 MG PO TABS
10.0000 mg | ORAL_TABLET | Freq: Every day | ORAL | Status: DC
  Administered 2018-12-04 – 2018-12-11 (×7): 10 mg via ORAL
  Filled 2018-12-04 (×8): qty 1

## 2018-12-04 MED ORDER — MOVING RIGHT ALONG BOOK
Freq: Once | Status: AC
  Administered 2018-12-04: 07:00:00
  Filled 2018-12-04: qty 1

## 2018-12-04 MED ORDER — PATIROMER SORBITEX CALCIUM 8.4 G PO PACK
8.4000 g | PACK | Freq: Once | ORAL | Status: AC
  Administered 2018-12-04: 8.4 g via ORAL
  Filled 2018-12-04: qty 1

## 2018-12-04 MED ORDER — CHLORHEXIDINE GLUCONATE CLOTH 2 % EX PADS
6.0000 | MEDICATED_PAD | Freq: Every day | CUTANEOUS | Status: DC
  Administered 2018-12-05: 6 via TOPICAL

## 2018-12-04 MED ORDER — DARBEPOETIN ALFA 150 MCG/0.3ML IJ SOSY
150.0000 ug | PREFILLED_SYRINGE | INTRAMUSCULAR | Status: DC
  Administered 2018-12-05: 150 ug via INTRAVENOUS

## 2018-12-04 NOTE — Progress Notes (Signed)
Subjective:  Did gentle HD yest- removed 300- tolerated well.  Sitting up in chair-  Looks a little brighter  Objective Vital signs in last 24 hours: Vitals:   12/04/18 0300 12/04/18 0400 12/04/18 0500 12/04/18 0600  BP: 124/66 (!) 153/79 (!) 146/75   Pulse: 80 80 79 89  Resp: 13 15 13  (!) 22  Temp:  97.9 F (36.6 C)    TempSrc:  Oral    SpO2: 99% 100% 100% 100%  Weight:   62.5 kg   Height:       Weight change: 0.8 kg  Intake/Output Summary (Last 24 hours) at 12/04/2018 0704 Last data filed at 12/04/2018 0631 Gross per 24 hour  Intake 340.8 ml  Output 868 ml  Net -527.2 ml    Dialysis Orders:  MWF - [facility]  3.5hrs, BFR 450, DFR 800,  [name] 61.5kg, 2K/ 2Ca  Access: RU AVF  Heparin 5000 unit bolus Mircera 200 mcg q2wks - last on 7/3 Calcitriol 3 mcg PO qHD  Assessment/Plan: 1.  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 Per CTS.   doing well 2.  ESRD -  Normally On HD MWF. K 4.3 pre surgery.     HD Wed-  K is 5.2 this AM as well, will give veltassa -  Plan will be for HD on Friday on schedule- will likely do in room again  3.  Hypertension/Volume -  Does not appear volume overloaded on exam but very positive.  Min removal with HD as able 4.  Anemia of CKD - Hgb 8.7. Due for ESA tomorrow.  Follow trend post surgery and transfuse as indicated.  5.  Secondary Hyperparathyroidism -  OP Ca at goal. Phos elevated.  Cont calcitriol and fosrenol 6.  Nutrition - Renal diet once advanced, Vit 7. Pulmonary HTN 8.  Hyperkalemia-  HD likely due to surgery-  Will give veltassa today      Louis Meckel    Labs: Basic Metabolic Panel: Recent Labs  Lab 12/03/18 0359 12/03/18 1646 12/04/18 0433  NA 136 136 134*  K 5.9* 3.6 5.2*  CL 100 99 93*  CO2 19* 25 22  GLUCOSE 89 81 99  BUN 54* 20 40*  CREATININE 6.99* 3.21* 5.37*  CALCIUM 8.7* 8.0* 8.5*   Liver Function Tests: Recent Labs  Lab 11/27/18 1225  AST 21  ALT 17  ALKPHOS 60  BILITOT 0.3  PROT  6.4*  ALBUMIN 3.2*   No results for input(s): LIPASE, AMYLASE in the last 168 hours. No results for input(s): AMMONIA in the last 168 hours. CBC: Recent Labs  Lab 12/02/18 1415  12/02/18 1917 12/03/18 0359 12/03/18 1646 12/04/18 0433  WBC 5.5  --  12.4* 10.4 8.2 9.1  HGB 8.4*   < > 9.0* 8.7* 8.6* 8.4*  HCT 27.3*   < > 29.0* 29.2* 27.7* 27.2*  MCV 105.8*  --  105.8* 107.0* 104.1* 104.6*  PLT 102*  --  157 145* 117* 119*   < > = values in this interval not displayed.   Cardiac Enzymes: No results for input(s): CKTOTAL, CKMB, CKMBINDEX, TROPONINI in the last 168 hours. CBG: Recent Labs  Lab 12/03/18 1146 12/03/18 1542 12/03/18 2007 12/03/18 2351 12/04/18 0427  GLUCAP 104* 98 101* 96 84    Iron Studies: No results for input(s): IRON, TIBC, TRANSFERRIN, FERRITIN in the last 72 hours. Studies/Results: Dg Chest Port 1 View  Result Date: 12/03/2018 CLINICAL DATA:  Status post coronary  bypass grafting EXAM: PORTABLE CHEST 1 VIEW COMPARISON:  12/02/2018 FINDINGS: Cardiac shadow is enlarged but stable. Aortic calcifications are again seen. Postsurgical changes are noted. Mediastinal drain and pericardial drain seen as well as bilateral thoracostomy catheters. Tiny right apical pneumothorax is again seen and stable. Swan-Ganz catheter is been withdrawn and now lies in the main right pulmonary artery. Endotracheal tube and gastric catheter have been removed in the interval. Small bilateral pleural effusions are noted. Associated bibasilar atelectasis is seen. IMPRESSION: Stable small right apical pneumothorax. Tubes and lines as described. Bibasilar atelectasis and effusions. Electronically Signed   By: Inez Catalina M.D.   On: 12/03/2018 08:38   Dg Chest Port 1 View  Result Date: 12/02/2018 CLINICAL DATA:  Postop film for valve replacement surgery. EXAM: PORTABLE CHEST 1 VIEW COMPARISON:  Chest x-ray dated 11/27/2018. FINDINGS: Endotracheal tube appears well positioned with tip above the  level of the carina. Enteric tube passes below the diaphragm. Swan-Ganz catheter in place with tip approximately 6 cm to the RIGHT of midline. Mediastinal drains in place. New valve in place. Mild central pulmonary vascular congestion. Probable mild bibasilar atelectasis and/or small pleural effusions. Small RIGHT apical pneumothorax. Bilateral chest tubes in place. IMPRESSION: 1. Tip of Swan-Ganz catheter is approximately 6 cm to the RIGHT of midline. For optimal radiographic positioning, would consider retracting to a position closer to the midline. 2. Small RIGHT apical pneumothorax. Bilateral chest tubes in place. 3. Mild central pulmonary vascular congestion. Probable mild bibasilar atelectasis and/or small pleural effusions. These results will be called to the ordering clinician or representative by the Radiologist Assistant, and communication documented in the PACS or zVision Dashboard. Electronically Signed   By: Franki Cabot M.D.   On: 12/02/2018 14:45   Medications: Infusions: . sodium chloride      Scheduled Medications: . acetaminophen  1,000 mg Oral Q6H  . bisacodyl  10 mg Oral Daily   Or  . bisacodyl  10 mg Rectal Daily  . calcitRIOL  3 mcg Oral Q M,W,F-HD  . Chlorhexidine Gluconate Cloth  6 each Topical Daily  . Chlorhexidine Gluconate Cloth  6 each Topical Q0600  . cloNIDine  0.2 mg Transdermal Weekly  . docusate sodium  200 mg Oral Daily  . [START ON 12/05/2018] enoxaparin (LOVENOX) injection  30 mg Subcutaneous Q24H  . lanthanum  2,000 mg Oral TID WC  . mouth rinse  15 mL Mouth Rinse BID  . multivitamin  1 tablet Oral QHS  . pantoprazole  40 mg Oral BID  . predniSONE  10 mg Oral Q breakfast  . sodium chloride flush  3 mL Intravenous Q12H  . warfarin  2.5 mg Oral q1800  . Warfarin - Physician Dosing Inpatient   Does not apply q1800    have reviewed scheduled and prn medications.  Physical Exam: General: eyes closed, sitting still, minimal conversation but more than  yest Heart: RRR Lungs: poor effort Abdomen: soft Extremities: min edema Dialysis Access: right AVF- patent     12/04/2018,7:04 AM  LOS: 2 days

## 2018-12-04 NOTE — Progress Notes (Signed)
Patient ID: Nannette Zill, female   DOB: [birth year], 58 y.o.   MRN: [id number] EVENING ROUNDS NOTE :     [address]       [place], [address]             [id number]                 2 Days Post-Op Procedure(s) (LRB): AORTIC VALVE REPLACEMENT (AVR) USING CARBOMEDICS SUPRA-ANNULAR TOP HAT SIZE 21MM (N/A) MITRAL VALVE (MV) REPLACEMENT USING CARBOMEDICS OPTIFORM SIZE 31MM (N/A) TRANSESOPHAGEAL ECHOCARDIOGRAM (TEE) (N/A)  Total Length of Stay:  LOS: 2 days  BP (!) 155/76   Pulse 76   Temp (!) 96.2 F (35.7 C) (Axillary)   Resp 13   Ht 5\' 6"  (1.676 m)   Wt 62.5 kg   LMP 12/05/2010 (LMP Unknown)   SpO2 100%   BMI 22.24 kg/m   .Intake/Output      07/16 0701 - 07/17 0700   P.O.    I.V. (mL/kg)    IV Piggyback    Total Intake(mL/kg)    Emesis/NG output    Other    Chest Tube 50   Total Output 50   Net -50         . sodium chloride       Lab Results  Component Value Date   WBC 9.1 12/04/2018   HGB 8.4 (L) 12/04/2018   HCT 27.2 (L) 12/04/2018   PLT 119 (L) 12/04/2018   GLUCOSE 99 12/04/2018   CHOL 187 02/23/2016   TRIG 89 02/23/2016   HDL 55 02/23/2016   LDLCALC 114 02/23/2016   ALT 17 11/27/2018   AST 21 11/27/2018   NA 134 (L) 12/04/2018   K 5.2 (H) 12/04/2018   CL 93 (L) 12/04/2018   CREATININE 5.37 (H) 12/04/2018   BUN 40 (H) 12/04/2018   CO2 22 12/04/2018   TSH 0.711 07/26/2014   INR 1.2 12/04/2018   HGBA1C 4.9 11/27/2018  stable day   Grace Isaac MD  Beeper [phone] 12/04/2018 7:16 PM

## 2018-12-04 NOTE — Discharge Instructions (Signed)
Discharge Instructions:  1. You may shower, please wash incisions daily with soap and water and keep dry.  If you wish to cover wounds with dressing you may do so but please keep clean and change daily.  No tub baths or swimming until incisions have completely healed.  If your incisions become red or develop any drainage please call our office at [phone]  2. No Driving until cleared by Dr. Owen's office and you are no longer using narcotic pain medications  3. Monitor your weight daily.. Please use the same scale and weigh at same time... If you gain 5-10 lbs in 48 hours with associated lower extremity swelling, please contact our office at [phone]  4. Fever of 101.5 for at least 24 hours with no source, please contact our office at [phone]  5. Activity- up as tolerated, please walk at least 3 times per day.  Avoid strenuous activity, no lifting, pushing, or pulling with your arms over 8-10 lbs for a minimum of 6 weeks  6. If any questions or concerns arise, please do not hesitate to contact our office at [phone] Information on my medicine - Coumadin   (Warfarin)   Why was Coumadin prescribed for you? Coumadin was prescribed for you because you have a blood clot or a medical condition that can cause an increased risk of forming blood clots. Blood clots can cause serious health problems by blocking the flow of blood to the heart, lung, or brain. Coumadin can prevent harmful blood clots from forming. As a reminder your indication for Coumadin is:   Blood Clot Prevention After Heart Valve Surgery  What test will check on my response to Coumadin? While on Coumadin (warfarin) you will need to have an INR test regularly to ensure that your dose is keeping you in the desired range. The INR (international normalized ratio) number is calculated from the result of the laboratory test called prothrombin time (PT).  If an INR APPOINTMENT HAS NOT ALREADY BEEN MADE FOR YOU please  schedule an appointment to have this lab work done by your health care provider within 7 days. Your INR goal is usually a number between:  2 to 3 or your provider may give you a more narrow range like 2-2.5.  Ask your health care provider during an office visit what your goal INR is.  What  do you need to  know  About  COUMADIN? Take Coumadin (warfarin) exactly as prescribed by your healthcare provider about the same time each day.  DO NOT stop taking without talking to the doctor who prescribed the medication.  Stopping without other blood clot prevention medication to take the place of Coumadin may increase your risk of developing a new clot or stroke.  Get refills before you run out.  What do you do if you miss a dose? If you miss a dose, take it as soon as you remember on the same day then continue your regularly scheduled regimen the next day.  Do not take two doses of Coumadin at the same time.  Important Safety Information A possible side effect of Coumadin (Warfarin) is an increased risk of bleeding. You should call your healthcare provider right away if you experience any of the following: ? Bleeding from an injury or your nose that does not stop. ? Unusual colored urine (red or dark brown) or unusual colored stools (red or black). ? Unusual bruising for unknown reasons. ? A serious fall or if you hit your head (even if   there is no bleeding).  Some foods or medicines interact with Coumadin (warfarin) and might alter your response to warfarin. To help avoid this: ? Eat a balanced diet, maintaining a consistent amount of Vitamin K. ? Notify your provider about major diet changes you plan to make. ? Avoid alcohol or limit your intake to 1 drink for women and 2 drinks for men per day. (1 drink is 5 oz. wine, 12 oz. beer, or 1.5 oz. liquor.)  Make sure that ANY health care provider who prescribes medication for you knows that you are taking Coumadin (warfarin).  Also make sure the  healthcare provider who is monitoring your Coumadin knows when you have started a new medication including herbals and non-prescription products.  Coumadin (Warfarin)  Major Drug Interactions  Increased Warfarin Effect Decreased Warfarin Effect  Alcohol (large quantities) Antibiotics (esp. Septra/Bactrim, Flagyl, Cipro) Amiodarone (Cordarone) Aspirin (ASA) Cimetidine (Tagamet) Megestrol (Megace) NSAIDs (ibuprofen, naproxen, etc.) Piroxicam (Feldene) Propafenone (Rythmol SR) Propranolol (Inderal) Isoniazid (INH) Posaconazole (Noxafil) Barbiturates (Phenobarbital) Carbamazepine (Tegretol) Chlordiazepoxide (Librium) Cholestyramine (Questran) Griseofulvin Oral Contraceptives Rifampin Sucralfate (Carafate) Vitamin K   Coumadin (Warfarin) Major Herbal Interactions  Increased Warfarin Effect Decreased Warfarin Effect  Garlic Ginseng Ginkgo biloba Coenzyme Q10 Green tea St. John's wort    Coumadin (Warfarin) FOOD Interactions  Eat a consistent number of servings per week of foods HIGH in Vitamin K (1 serving =  cup)  Collards (cooked, or boiled & drained) Kale (cooked, or boiled & drained) Mustard greens (cooked, or boiled & drained) Parsley *serving size only =  cup Spinach (cooked, or boiled & drained) Swiss chard (cooked, or boiled & drained) Turnip greens (cooked, or boiled & drained)  Eat a consistent number of servings per week of foods MEDIUM-HIGH in Vitamin K (1 serving = 1 cup)  Asparagus (cooked, or boiled & drained) Broccoli (cooked, boiled & drained, or raw & chopped) Brussel sprouts (cooked, or boiled & drained) *serving size only =  cup Lettuce, raw (green leaf, endive, romaine) Spinach, raw Turnip greens, raw & chopped   These websites have more information on Coumadin (warfarin):  www.coumadin.com; www.ahrq.gov/consumer/coumadin.htm;    

## 2018-12-04 NOTE — Progress Notes (Addendum)
TCTS DAILY ICU PROGRESS NOTE                   [address]            [place] [address]          [phone]   2 Days Post-Op Procedure(s) (LRB): AORTIC VALVE REPLACEMENT (AVR) USING CARBOMEDICS SUPRA-ANNULAR TOP HAT SIZE 21MM (N/A) MITRAL VALVE (MV) REPLACEMENT USING CARBOMEDICS OPTIFORM SIZE 31MM (N/A) TRANSESOPHAGEAL ECHOCARDIOGRAM (TEE) (N/A)  Total Length of Stay:  LOS: 2 days   Subjective: Up in bedside chair. Said she is "a tiny bit better" today. Had some nausea with vomiting x 1 yesterday.  NO abd discomfort now.   Objective: Vital signs in last 24 hours: Temp:  [97 F (36.1 C)-98 F (36.7 C)] 97.9 F (36.6 C) (07/16 0400) Pulse Rate:  [68-89] 89 (07/16 0600) Cardiac Rhythm: Normal sinus rhythm (07/16 0400) Resp:  [12-22] 22 (07/16 0600) BP: (124-157)/(58-81) 146/75 (07/16 0500) SpO2:  [97 %-100 %] 100 % (07/16 0600) Arterial Line BP: (132-161)/(47-89) 136/79 (07/15 1900) Weight:  [62.5 kg-68.5 kg] 62.5 kg (07/16 0500)  Filed Weights   12/03/18 0500 12/03/18 1315 12/04/18 0500  Weight: 67.7 kg 68.5 kg 62.5 kg    Weight change: 0.8 kg   Hemodynamic parameters for last 24 hours:    Intake/Output from previous day: 07/15 0701 - 07/16 0700 In: 340.8 [P.O.:220; I.V.:20.8; IV Piggyback:100] Out: 868 [Emesis/NG output:50; Chest Tube:500]  Intake/Output this shift: No intake/output data recorded.  Current Meds: Scheduled Meds: . acetaminophen  1,000 mg Oral Q6H  . bisacodyl  10 mg Oral Daily   Or  . bisacodyl  10 mg Rectal Daily  . calcitRIOL  3 mcg Oral Q M,W,F-HD  . Chlorhexidine Gluconate Cloth  6 each Topical Daily  . Chlorhexidine Gluconate Cloth  6 each Topical Q0600  . Chlorhexidine Gluconate Cloth  6 each Topical Q0600  . cloNIDine  0.2 mg Transdermal Weekly  . [START ON 12/05/2018] darbepoetin (ARANESP) injection - DIALYSIS  150 mcg Intravenous Q Fri-HD  . docusate sodium  200 mg Oral Daily  . [START ON 12/05/2018] enoxaparin  (LOVENOX) injection  30 mg Subcutaneous Q24H  . lanthanum  2,000 mg Oral TID WC  . mouth rinse  15 mL Mouth Rinse BID  . multivitamin  1 tablet Oral QHS  . pantoprazole  40 mg Oral BID  . patiromer  8.4 g Oral Once  . predniSONE  10 mg Oral Q breakfast  . sodium chloride flush  3 mL Intravenous Q12H  . warfarin  2.5 mg Oral q1800  . Warfarin - Physician Dosing Inpatient   Does not apply q1800   Continuous Infusions: . sodium chloride     PRN Meds:.labetalol, metoprolol tartrate, morphine injection, ondansetron (ZOFRAN) IV, oxyCODONE, sodium chloride flush, traMADol  General appearance: alert, cooperative and mild distress Neurologic: intact Heart: regular rate and rhythm Lungs: Breath sounds clear anterior. CT drainage 254ml past 12 hours with thin,serous fluid.   Abdomen: Few bowel sounds. Soft and non-tender. Extremities: No peripheral edema. Wound: Sternotomy covered with an intact Aquacel dressing. Right arm fistula has a dry dressing with appropriate thrill.   Lab Results: CBC: Recent Labs    12/03/18 1646 12/04/18 0433  WBC 8.2 9.1  HGB 8.6* 8.4*  HCT 27.7* 27.2*  PLT 117* 119*   BMET:  Recent Labs    12/03/18 1646 12/04/18 0433  NA 136 134*  K 3.6 5.2*  CL 99  93*  CO2 25 22  GLUCOSE 81 99  BUN 20 40*  CREATININE 3.21* 5.37*  CALCIUM 8.0* 8.5*    CMET: Lab Results  Component Value Date   WBC 9.1 12/04/2018   HGB 8.4 (L) 12/04/2018   HCT 27.2 (L) 12/04/2018   PLT 119 (L) 12/04/2018   GLUCOSE 99 12/04/2018   CHOL 187 02/23/2016   TRIG 89 02/23/2016   HDL 55 02/23/2016   LDLCALC 114 02/23/2016   ALT 17 11/27/2018   AST 21 11/27/2018   NA 134 (L) 12/04/2018   K 5.2 (H) 12/04/2018   CL 93 (L) 12/04/2018   CREATININE 5.37 (H) 12/04/2018   BUN 40 (H) 12/04/2018   CO2 22 12/04/2018   TSH 0.711 07/26/2014   INR 1.2 12/04/2018   HGBA1C 4.9 11/27/2018      PT/INR:  Recent Labs    12/04/18 0433  LABPROT 14.9  INR 1.2   Radiology: No results  found.   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  Hemodynamically stable.  Advance activity, continue anticoagulation with Coumadin. D/C IJ cordis and mediastinal tubes today. Leave pleural CT's for drainage another day.    -ESRD on HD (M-W-F) - tolerated a brief run of hemodialysis yesterday. Nephrology planning to resume usual schedule with HD tomorrow.    -Expected acute blood loss anemia.Stable Hct  with minimal ongoing losses. Continue to monitor.  --Thrombocytopenia- trending down but no indication for intervention. Continue to monitor.  -Arthritis- treated with chronic oral steroid. Prednisone resumed  - Left BBB not as prominent today, mostly JR with an occasional P-wave. Continue Administrator, sports.  -HTN--moderate, Catapres patch resumed, resume Norvasc today.     Antony Odea, PA-C [phone] 12/04/2018 7:41 AM   I have seen and examined the patient and agree with the assessment and plan as outlined.  Doing well POD2.  Maintaining NSR w/ stable BP, somewhat hypertensive at times.  Breathing comfortably w/ O2 sats 100% on 2 L/min.  CXR looks good w/ mild bibasilar ATX.  Mobilize.  D/C mediastinal tubes and leave pleural tubes in another day.  Restart Norvasc and continue clonidine patch.  Hold beta blockers.  Coumadin.  HD per Nephrology team.  Rexene Alberts, MD 12/04/2018 8:20 AM

## 2018-12-05 ENCOUNTER — Inpatient Hospital Stay (HOSPITAL_COMMUNITY): Payer: Medicare Other

## 2018-12-05 LAB — BASIC METABOLIC PANEL
Anion gap: 19 — ABNORMAL HIGH (ref 5–15)
BUN: 40 mg/dL — ABNORMAL HIGH (ref 6–20)
CO2: 22 mmol/L (ref 22–32)
Calcium: 8.5 mg/dL — ABNORMAL LOW (ref 8.9–10.3)
Chloride: 93 mmol/L — ABNORMAL LOW (ref 98–111)
Creatinine, Ser: 5.37 mg/dL — ABNORMAL HIGH (ref 0.44–1.00)
GFR calc Af Amer: 10 mL/min — ABNORMAL LOW (ref >60)
GFR calc non Af Amer: 8 mL/min — ABNORMAL LOW (ref >60)
Glucose, Bld: 99 mg/dL (ref 70–99)
Potassium: 5.2 mmol/L — ABNORMAL HIGH (ref 3.5–5.1)
Sodium: 134 mmol/L — ABNORMAL LOW (ref 135–145)

## 2018-12-05 LAB — RENAL FUNCTION PANEL
Albumin: 2.8 g/dL — ABNORMAL LOW (ref 3.5–5.0)
Albumin: 2.9 g/dL — ABNORMAL LOW (ref 3.5–5.0)
Anion gap: 17 — ABNORMAL HIGH (ref 5–15)
Anion gap: 18 — ABNORMAL HIGH (ref 5–15)
BUN: 61 mg/dL — ABNORMAL HIGH (ref 6–20)
BUN: 65 mg/dL — ABNORMAL HIGH (ref 6–20)
CO2: 22 mmol/L (ref 22–32)
CO2: 20 mmol/L — ABNORMAL LOW (ref 22–32)
Calcium: 8.7 mg/dL — ABNORMAL LOW (ref 8.9–10.3)
Calcium: 8.7 mg/dL — ABNORMAL LOW (ref 8.9–10.3)
Chloride: 91 mmol/L — ABNORMAL LOW (ref 98–111)
Chloride: 90 mmol/L — ABNORMAL LOW (ref 98–111)
Creatinine, Ser: 7.06 mg/dL — ABNORMAL HIGH (ref 0.44–1.00)
Creatinine, Ser: 7.33 mg/dL — ABNORMAL HIGH (ref 0.44–1.00)
GFR calc Af Amer: 7 mL/min — ABNORMAL LOW (ref >60)
GFR calc Af Amer: 7 mL/min — ABNORMAL LOW (ref >60)
GFR calc non Af Amer: 6 mL/min — ABNORMAL LOW (ref >60)
GFR calc non Af Amer: 6 mL/min — ABNORMAL LOW (ref >60)
Glucose, Bld: 95 mg/dL (ref 70–99)
Glucose, Bld: 109 mg/dL — ABNORMAL HIGH (ref 70–99)
Phosphorus: 10.9 mg/dL — ABNORMAL HIGH (ref 2.5–4.6)
Phosphorus: 10.9 mg/dL — ABNORMAL HIGH (ref 2.5–4.6)
Potassium: 5.7 mmol/L — ABNORMAL HIGH (ref 3.5–5.1)
Potassium: 5.8 mmol/L — ABNORMAL HIGH (ref 3.5–5.1)
Sodium: 130 mmol/L — ABNORMAL LOW (ref 135–145)
Sodium: 128 mmol/L — ABNORMAL LOW (ref 135–145)

## 2018-12-05 LAB — CBC
HCT: 25.9 % — ABNORMAL LOW (ref 36.0–46.0)
HCT: 26.1 % — ABNORMAL LOW (ref 36.0–46.0)
Hemoglobin: 8.2 g/dL — ABNORMAL LOW (ref 12.0–15.0)
Hemoglobin: 8.2 g/dL — ABNORMAL LOW (ref 12.0–15.0)
MCH: 32.3 pg (ref 26.0–34.0)
MCH: 32.2 pg (ref 26.0–34.0)
MCHC: 31.7 g/dL (ref 30.0–36.0)
MCHC: 31.4 g/dL (ref 30.0–36.0)
MCV: 102 fL — ABNORMAL HIGH (ref 80.0–100.0)
MCV: 102.4 fL — ABNORMAL HIGH (ref 80.0–100.0)
Platelets: 126 10*3/uL — ABNORMAL LOW (ref 150–400)
Platelets: 145 10*3/uL — ABNORMAL LOW (ref 150–400)
RBC: 2.54 MIL/uL — ABNORMAL LOW (ref 3.87–5.11)
RBC: 2.55 MIL/uL — ABNORMAL LOW (ref 3.87–5.11)
RDW: 17.9 % — ABNORMAL HIGH (ref 11.5–15.5)
RDW: 18.3 % — ABNORMAL HIGH (ref 11.5–15.5)
WBC: 9.7 10*3/uL (ref 4.0–10.5)
WBC: 11.2 10*3/uL — ABNORMAL HIGH (ref 4.0–10.5)
nRBC: 1.5 % — ABNORMAL HIGH (ref 0.0–0.2)
nRBC: 1.5 % — ABNORMAL HIGH (ref 0.0–0.2)

## 2018-12-05 LAB — GLUCOSE, CAPILLARY
Glucose-Capillary: 100 mg/dL — ABNORMAL HIGH (ref 70–99)
Glucose-Capillary: 104 mg/dL — ABNORMAL HIGH (ref 70–99)
Glucose-Capillary: 113 mg/dL — ABNORMAL HIGH (ref 70–99)
Glucose-Capillary: 131 mg/dL — ABNORMAL HIGH (ref 70–99)
Glucose-Capillary: 84 mg/dL (ref 70–99)
Glucose-Capillary: 93 mg/dL (ref 70–99)
Glucose-Capillary: 95 mg/dL (ref 70–99)
Glucose-Capillary: 99 mg/dL (ref 70–99)

## 2018-12-05 LAB — PROTIME-INR
INR: 1.4 — ABNORMAL HIGH (ref 0.8–1.2)
Prothrombin Time: 16.5 s — ABNORMAL HIGH (ref 11.4–15.2)

## 2018-12-05 MED ORDER — SODIUM CHLORIDE 0.9 % IV SOLN: 100.0000 mL | INTRAVENOUS | Status: DC | PRN

## 2018-12-05 MED ORDER — LIDOCAINE HCL (PF) 1 % IJ SOLN: 5.0000 mL | INTRAMUSCULAR | Status: DC | PRN

## 2018-12-05 MED ORDER — HEPARIN SODIUM (PORCINE) 1000 UNIT/ML DIALYSIS
1000.0000 [IU] | INTRAMUSCULAR | Status: DC | PRN
  Filled 2018-12-05: qty 1

## 2018-12-05 MED ORDER — MIDAZOLAM HCL 2 MG/2ML IJ SOLN
2.0000 mg | Freq: Once | INTRAMUSCULAR | Status: AC
  Administered 2018-12-05: 2 mg via INTRAVENOUS

## 2018-12-05 MED ORDER — PENTAFLUOROPROP-TETRAFLUOROETH EX AERO: 1.0000 "application " | INHALATION_SPRAY | CUTANEOUS | Status: DC | PRN

## 2018-12-05 MED ORDER — MIDAZOLAM HCL 2 MG/2ML IJ SOLN
INTRAMUSCULAR | Status: AC
  Filled 2018-12-05: qty 2

## 2018-12-05 MED ORDER — LIDOCAINE-PRILOCAINE 2.5-2.5 % EX CREA: 1.0000 "application " | TOPICAL_CREAM | CUTANEOUS | Status: DC | PRN

## 2018-12-05 MED ORDER — LABETALOL HCL 300 MG PO TABS
300.0000 mg | ORAL_TABLET | Freq: Two times a day (BID) | ORAL | Status: DC
  Administered 2018-12-05 – 2018-12-11 (×13): 300 mg via ORAL
  Filled 2018-12-05 (×13): qty 1

## 2018-12-05 MED ORDER — CLONIDINE HCL 0.3 MG/24HR TD PTWK
0.3000 mg | MEDICATED_PATCH | TRANSDERMAL | Status: DC
  Administered 2018-12-09: 0.3 mg via TRANSDERMAL
  Filled 2018-12-05: qty 1

## 2018-12-05 MED ORDER — ALTEPLASE 2 MG IJ SOLR: 2.0000 mg | Freq: Once | INTRAMUSCULAR | Status: DC | PRN

## 2018-12-05 NOTE — Progress Notes (Signed)
     [address]       [place] [address]             [phone]       Evening Rounds  POD 3 s/p MVR/AVR Pigtail placed for right pneumoT Doing well  Vitals:   12/05/18 1503 12/05/18 1600  BP:  138/79  Pulse:  78  Resp:  16  Temp: 97.8 F (36.6 C)   SpO2:  100%   Awake comfortable  Doing well Will attempt to pull more fluid on dialysis No metop

## 2018-12-05 NOTE — Procedures (Signed)
Patient was seen on dialysis and the procedure was supervised.  BFR 400  Via AVF BP is  157/81.   Patient appears to be tolerating treatment well  Louis Meckel 12/05/2018

## 2018-12-05 NOTE — Progress Notes (Signed)
Subjective:  Seen- on dialysis in the room - looking better every day.  She told me "my lung collapsed" Objective Vital signs in last 24 hours: Vitals:   12/05/18 0500 12/05/18 0600 12/05/18 0700 12/05/18 0748  BP: (!) 173/85 (!) 155/80 (!) 161/78   Pulse: 82 73 74   Resp: 20 15 14    Temp:    97.8 F (36.6 C)  TempSrc:    Oral  SpO2: 96% 99% 98%   Weight: 64.8 kg     Height:       Weight change: -3.7 kg  Intake/Output Summary (Last 24 hours) at 12/05/2018 0810 Last data filed at 12/05/2018 0600 Gross per 24 hour  Intake 530 ml  Output 50 ml  Net 480 ml    Dialysis Orders:  MWF - [facility]  3.5hrs, BFR 450, DFR 800,  [name] 61.5kg, 2K/ 2Ca  Access: RU AVF  Heparin 5000 unit bolus Mircera 200 mcg q2wks - last on 7/3 Calcitriol 3 mcg PO qHD  Assessment/Plan: 1.  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 Per CTS.   doing well- she thinks CT needs replaced  2.  ESRD -  Normally On HD MWF. K 4.3 pre surgery.     HD Wed and Friday on schedule- should not be due again til Monday via AVF , will watch K 3.  Hypertension/Volume -  Does not appear volume overloaded on exam but very positive.  Removal with HD as able.  On many antihypertensives as OP - have been added on  4.  Anemia of CKD - Hgb 8.2. Due for ESA today.  Follow trend post surgery and transfuse as indicated.  5.  Secondary Hyperparathyroidism -  OP Ca at goal. Phos elevated (over 10).  Cont calcitriol and fosrenol 6.  Nutrition - Renal diet once advanced, Vit 7. Pulmonary HTN 8.  Hyperkalemia-  HD today, will then watch and use K binders as needed      Louis Meckel    Labs: Basic Metabolic Panel: Recent Labs  Lab 12/03/18 1646 12/04/18 0433 12/05/18 0322  NA 136 134* 130*  K 3.6 5.2* 5.7*  CL 99 93* 91*  CO2 25 22 22   GLUCOSE 81 99 95  BUN 20 40* 61*  CREATININE 3.21* 5.37* 7.06*  CALCIUM 8.0* 8.5* 8.7*  PHOS  --   --  10.9*   Liver Function Tests: Recent Labs  Lab 12/05/18 0322   ALBUMIN 2.8*   No results for input(s): LIPASE, AMYLASE in the last 168 hours. No results for input(s): AMMONIA in the last 168 hours. CBC: Recent Labs  Lab 12/02/18 1917 12/03/18 0359 12/03/18 1646 12/04/18 0433 12/05/18 0322  WBC 12.4* 10.4 8.2 9.1 9.7  HGB 9.0* 8.7* 8.6* 8.4* 8.2*  HCT 29.0* 29.2* 27.7* 27.2* 25.9*  MCV 105.8* 107.0* 104.1* 104.6* 102.0*  PLT 157 145* 117* 119* 126*   Cardiac Enzymes: No results for input(s): CKTOTAL, CKMB, CKMBINDEX, TROPONINI in the last 168 hours. CBG: Recent Labs  Lab 12/04/18 1627 12/04/18 2015 12/04/18 2351 12/05/18 0338 12/05/18 0747  GLUCAP 131* 101* 95 99 113*    Iron Studies: No results for input(s): IRON, TIBC, TRANSFERRIN, FERRITIN in the last 72 hours. Studies/Results: Dg Chest Port 1 View  Result Date: 12/04/2018 CLINICAL DATA:  Status post coronary bypass graft. EXAM: PORTABLE CHEST 1 VIEW COMPARISON:  Radiograph of December 03, 2018. FINDINGS: Stable cardiomediastinal silhouette. Status post cardiac valve repair. Stable bilateral chest tubes are  noted. Stable mild right apical pneumothorax is noted. Stable bibasilar atelectasis and probable small pleural effusions are noted. Right internal jugular catheter has been removed. Right internal jugular sheath remains. Bony thorax is unremarkable. IMPRESSION: Stable bilateral chest tubes are noted. Stable small right apical pneumothorax is noted. Stable bibasilar opacities as described above. Electronically Signed   By: Marijo Conception M.D.   On: 12/04/2018 08:40   Medications: Infusions: . sodium chloride    . sodium chloride    . sodium chloride      Scheduled Medications: . acetaminophen  1,000 mg Oral Q6H  . amLODipine  10 mg Oral Daily  . bisacodyl  10 mg Oral Daily   Or  . bisacodyl  10 mg Rectal Daily  . calcitRIOL  3 mcg Oral Q M,W,F-HD  . Chlorhexidine Gluconate Cloth  6 each Topical Daily  . Chlorhexidine Gluconate Cloth  6 each Topical Q0600  . Chlorhexidine  Gluconate Cloth  6 each Topical Q0600  . [START ON 12/09/2018] cloNIDine  0.3 mg Transdermal Weekly  . darbepoetin (ARANESP) injection - DIALYSIS  150 mcg Intravenous Q Fri-HD  . docusate sodium  200 mg Oral Daily  . enoxaparin (LOVENOX) injection  30 mg Subcutaneous Q24H  . labetalol  300 mg Oral BID  . lanthanum  2,000 mg Oral TID WC  . mouth rinse  15 mL Mouth Rinse BID  . multivitamin  1 tablet Oral QHS  . pantoprazole  40 mg Oral BID  . predniSONE  10 mg Oral Q breakfast  . sodium chloride flush  3 mL Intravenous Q12H  . warfarin  2.5 mg Oral q1800  . Warfarin - Physician Dosing Inpatient   Does not apply q1800    have reviewed scheduled and prn medications.  Physical Exam: General: more conversational- in pain  Heart: RRR Lungs: poor effort Abdomen: soft Extremities: min edema Dialysis Access: right AVF- patent     12/05/2018,8:10 AM  LOS: 3 days

## 2018-12-05 NOTE — Progress Notes (Signed)
Pt request to turn UF off d/t cramping will follow up

## 2018-12-05 NOTE — Procedures (Signed)
CARDIOTHORACIC SURGERY OPERATIVE NOTE  Date of Procedure:  12/05/2018  Preoperative Diagnosis: Right Pneumothorax  Postoperative Diagnosis: Same  Procedure:   Right Pleural Pigtail Catheter Placement  Surgeon:   Enid Cutter, PA-C  Anesthesia: 1% lidocaine local with intravenous sedation    DETAILS OF THE OPERATIVE PROCEDURE  Following full informed consent the patient was given midazolam 2 mg intravenously and continuously monitored for rhythm, BP and oxygen saturation. The right chest was prepared and draped in a sterile manner. 1% lidocaine was utilized to anesthetize the skin and subcutaneous tissues. A small incision was made and a 41fr  Pleural pigtail catheter was placed through the incision into the pleural space using Seldinger technique.. The tube was secured to the skin and connected to a closed suction collection device. The patient tolerated the procedure well. A portable CXR was ordered. There were no complications.    Enid Cutter, PA-C 12/05/2018 2:36 PM

## 2018-12-05 NOTE — Progress Notes (Signed)
Pt refused to come out of UF stating you can clean my blood no more pulling fluid

## 2018-12-05 NOTE — Progress Notes (Addendum)
TCTS DAILY ICU PROGRESS NOTE                   [address]            [place] [address]          [phone]   3 Days Post-Op Procedure(s) (LRB): AORTIC VALVE REPLACEMENT (AVR) USING CARBOMEDICS SUPRA-ANNULAR TOP HAT SIZE 21MM (N/A) MITRAL VALVE (MV) REPLACEMENT USING CARBOMEDICS OPTIFORM SIZE 31MM (N/A) TRANSESOPHAGEAL ECHOCARDIOGRAM (TEE) (N/A)  Total Length of Stay:  LOS: 3 days   Subjective: Alert and oriented, up in the chair. Says her legs are getting stronger but still having a lot of chest soreness.  Walked in the hall yesterday. RN setting up hemodialysis now.  Objective: Vital signs in last 24 hours: Temp:  [96.2 F (35.7 C)-98.7 F (37.1 C)] 97.8 F (36.6 C) (07/17 0400) Pulse Rate:  [66-82] 74 (07/17 0700) Cardiac Rhythm: Normal sinus rhythm (07/17 0400) Resp:  [13-25] 14 (07/17 0700) BP: (144-188)/(69-92) 161/78 (07/17 0700) SpO2:  [95 %-100 %] 98 % (07/17 0700) Weight:  [64.8 kg] 64.8 kg (07/17 0500)  Filed Weights   12/03/18 1315 12/04/18 0500 12/05/18 0500  Weight: 68.5 kg 62.5 kg 64.8 kg    Weight change: -3.7 kg   Hemodynamic parameters for last 24 hours:    Intake/Output from previous day: 07/16 0701 - 07/17 0700 In: 530 [P.O.:530] Out: 50 [Chest Tube:50]  Intake/Output this shift: No intake/output data recorded.  Current Meds: Scheduled Meds: . acetaminophen  1,000 mg Oral Q6H  . amLODipine  10 mg Oral Daily  . bisacodyl  10 mg Oral Daily   Or  . bisacodyl  10 mg Rectal Daily  . calcitRIOL  3 mcg Oral Q M,W,F-HD  . Chlorhexidine Gluconate Cloth  6 each Topical Daily  . Chlorhexidine Gluconate Cloth  6 each Topical Q0600  . Chlorhexidine Gluconate Cloth  6 each Topical Q0600  . [START ON 12/09/2018] cloNIDine  0.3 mg Transdermal Weekly  . darbepoetin (ARANESP) injection - DIALYSIS  150 mcg Intravenous Q Fri-HD  . docusate sodium  200 mg Oral Daily  . enoxaparin (LOVENOX) injection  30 mg Subcutaneous Q24H  .  lanthanum  2,000 mg Oral TID WC  . mouth rinse  15 mL Mouth Rinse BID  . multivitamin  1 tablet Oral QHS  . pantoprazole  40 mg Oral BID  . predniSONE  10 mg Oral Q breakfast  . sodium chloride flush  3 mL Intravenous Q12H  . warfarin  2.5 mg Oral q1800  . Warfarin - Physician Dosing Inpatient   Does not apply q1800   Continuous Infusions: . sodium chloride     PRN Meds:.labetalol, metoprolol tartrate, morphine injection, ondansetron (ZOFRAN) IV, oxyCODONE, sodium chloride flush, traMADol  General appearance:alert, cooperative and mild distress Neurologic:intact Heart:regular rate and rhythm Lungs:Breath sounds clear anterior and posterior.  O2 sats acceptable on 2Lnc. CXR this AM shows moderate right PTX.    Abdomen:Few bowel sounds. Soft and non-tender. Extremities:No peripheral edema. Wound:Sternotomy covered with an intact Aquacel dressing.  Lab Results: CBC: Recent Labs    12/04/18 0433 12/05/18 0322  WBC 9.1 9.7  HGB 8.4* 8.2*  HCT 27.2* 25.9*  PLT 119* 126*   BMET:  Recent Labs    12/04/18 0433 12/05/18 0322  NA 134* 130*  K 5.2* 5.7*  CL 93* 91*  CO2 22 22  GLUCOSE 99 95  BUN 40* 61*  CREATININE 5.37* 7.06*  CALCIUM 8.5* 8.7*  CMET: Lab Results  Component Value Date   WBC 9.7 12/05/2018   HGB 8.2 (L) 12/05/2018   HCT 25.9 (L) 12/05/2018   PLT 126 (L) 12/05/2018   GLUCOSE 95 12/05/2018   CHOL 187 02/23/2016   TRIG 89 02/23/2016   HDL 55 02/23/2016   LDLCALC 114 02/23/2016   ALT 17 11/27/2018   AST 21 11/27/2018   NA 130 (L) 12/05/2018   K 5.7 (H) 12/05/2018   CL 91 (L) 12/05/2018   CREATININE 7.06 (H) 12/05/2018   BUN 61 (H) 12/05/2018   CO2 22 12/05/2018   TSH 0.711 07/26/2014   INR 1.4 (H) 12/05/2018   HGBA1C 4.9 11/27/2018      PT/INR:  Recent Labs    12/05/18 0322  LABPROT 16.5*  INR 1.4*   Radiology: No results found.   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  Continue anticoagulation with Coumadin.  -Right PTX post pleural tube removal last night. Respiratory status stable but needs a right pigtail catheter.  Will plan to place after HD later this AM.  -ESRD on HD (M-W-F) - HD x 3 hours planned for this AM. Management per  Nephrology.  -Expected acute blood loss anemia.Stable Hct  with minimal ongoing losses. Continue to monitor.  --Thrombocytopenia- Plt count is recoveriing.   -Arthritis- treated with chronic oral steroid. Prednisone resumed  - Back in stable SR.   -HTN--control reasonable with amlodipine, labetalol, and  Catapres patch resumed.    Antony Odea, PA-C [phone] 12/05/2018 7:39 AM   I have seen and examined the patient and agree with the assessment and plan as outlined.  Overall doing well although patient now has a moderately large right pneumothorax since her chest tubes were removed yesterday evening.  Breathing comfortably w/ O2 sats 97-99% on 2 L/min.  Maintaining NSR.  Still hypertensive.  Will resume home dose oral Labetalol.  For HD today.  Will need right chest tube.  Rexene Alberts, MD 12/05/2018 9:37 AM

## 2018-12-06 ENCOUNTER — Inpatient Hospital Stay (HOSPITAL_COMMUNITY): Payer: Medicare Other

## 2018-12-06 LAB — RENAL FUNCTION PANEL
Albumin: 2.8 g/dL — ABNORMAL LOW (ref 3.5–5.0)
Anion gap: 14 (ref 5–15)
BUN: 43 mg/dL — ABNORMAL HIGH (ref 6–20)
CO2: 25 mmol/L (ref 22–32)
Calcium: 9.1 mg/dL (ref 8.9–10.3)
Chloride: 91 mmol/L — ABNORMAL LOW (ref 98–111)
Creatinine, Ser: 5.64 mg/dL — ABNORMAL HIGH (ref 0.44–1.00)
GFR calc Af Amer: 9 mL/min — ABNORMAL LOW (ref >60)
GFR calc non Af Amer: 8 mL/min — ABNORMAL LOW (ref >60)
Glucose, Bld: 108 mg/dL — ABNORMAL HIGH (ref 70–99)
Phosphorus: 7.4 mg/dL — ABNORMAL HIGH (ref 2.5–4.6)
Potassium: 5 mmol/L (ref 3.5–5.1)
Sodium: 130 mmol/L — ABNORMAL LOW (ref 135–145)

## 2018-12-06 LAB — CBC
HCT: 26.3 % — ABNORMAL LOW (ref 36.0–46.0)
Hemoglobin: 8.3 g/dL — ABNORMAL LOW (ref 12.0–15.0)
MCH: 32.4 pg (ref 26.0–34.0)
MCHC: 31.6 g/dL (ref 30.0–36.0)
MCV: 102.7 fL — ABNORMAL HIGH (ref 80.0–100.0)
Platelets: 121 10*3/uL — ABNORMAL LOW (ref 150–400)
RBC: 2.56 MIL/uL — ABNORMAL LOW (ref 3.87–5.11)
RDW: 18.3 % — ABNORMAL HIGH (ref 11.5–15.5)
WBC: 8.9 10*3/uL (ref 4.0–10.5)
nRBC: 1.3 % — ABNORMAL HIGH (ref 0.0–0.2)

## 2018-12-06 LAB — PROTIME-INR
INR: 1.5 — ABNORMAL HIGH (ref 0.8–1.2)
Prothrombin Time: 17.9 seconds — ABNORMAL HIGH (ref 11.4–15.2)

## 2018-12-06 LAB — HEPATITIS B SURFACE ANTIGEN: Hepatitis B Surface Ag: NEGATIVE

## 2018-12-06 MED ORDER — PATIROMER SORBITEX CALCIUM 8.4 G PO PACK
8.4000 g | PACK | Freq: Every day | ORAL | Status: DC
  Administered 2018-12-06 – 2018-12-11 (×5): 8.4 g via ORAL
  Filled 2018-12-06 (×6): qty 1

## 2018-12-06 NOTE — Progress Notes (Signed)
Subjective:  HD yest- removed only 600 mostly per patient request- she kept telling nurse to stop pulling "I know my body"  Had pleural pigtail placed yest for PTX as well  Objective Vital signs in last 24 hours: Vitals:   12/06/18 0430 12/06/18 0500 12/06/18 0530 12/06/18 0600  BP: (!) 173/76 (!) 147/84 133/76 (!) 142/69  Pulse: 81 82 82 84  Resp: 10 10 10 20   Temp:      TempSrc:      SpO2: 98% 97% 96% 98%  Weight:  68.2 kg    Height:       Weight change: 1.9 kg  Intake/Output Summary (Last 24 hours) at 12/06/2018 0709 Last data filed at 12/06/2018 0600 Gross per 24 hour  Intake 720 ml  Output 729 ml  Net -9 ml    Dialysis Orders:  MWF - [facility]  3.5hrs, BFR 450, DFR 800,  [name] 61.5kg, 2K/ 2Ca  Access: RU AVF  Heparin 5000 unit bolus Mircera 200 mcg q2wks - last on 7/3 Calcitriol 3 mcg PO qHD  Assessment/Plan: 1.  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 Per CTS.  Had to have pigtail pleural tube placed but otherwise recovering extremely well 2.  ESRD -  Normally On HD MWF. K 4.3 pre surgery.     HD Wed and Friday on schedule- should not be due again til Monday via AVF ,  But will watch K 3.  Hypertension/Volume -  Does not appear volume overloaded on exam but very positive and 7 kg above [name] with low sodium yest.  Discussed with patient as she limited UF with HD yest.  Removal with next HD as able.  On many antihypertensives as OP - have been added on here norvasc 10/clonidine #3 patch/labetalol 300 BID- BP is reasonable 4.  Anemia of CKD - Hgb 8.2 yest. Given  ESA Friday.  Follow trend post surgery and transfuse as indicated.  5.  Secondary Hyperparathyroidism -  OP Ca at goal. Phos elevated (over 10).  Cont calcitriol and fosrenol 2000- has cheesy eggs on tray today 6.  Nutrition - Renal diet once advanced, Vit 7. Pulmonary HTN 8.  Hyperkalemia-   watch and use K binders/dialysis over weekend as needed      [place]: Basic Metabolic  Panel: Recent Labs  Lab 12/04/18 0433 12/05/18 0322 12/05/18 0808  NA 134* 130* 128*  K 5.2* 5.7* 5.8*  CL 93* 91* 90*  CO2 22 22 20*  GLUCOSE 99 95 109*  BUN 40* 61* 65*  CREATININE 5.37* 7.06* 7.33*  CALCIUM 8.5* 8.7* 8.7*  PHOS  --  10.9* 10.9*   Liver Function Tests: Recent Labs  Lab 12/05/18 0322 12/05/18 0808  ALBUMIN 2.8* 2.9*   No results for input(s): LIPASE, AMYLASE in the last 168 hours. No results for input(s): AMMONIA in the last 168 hours. CBC: Recent Labs  Lab 12/03/18 0359 12/03/18 1646 12/04/18 0433 12/05/18 0322 12/05/18 0808  WBC 10.4 8.2 9.1 9.7 11.2*  HGB 8.7* 8.6* 8.4* 8.2* 8.2*  HCT 29.2* 27.7* 27.2* 25.9* 26.1*  MCV 107.0* 104.1* 104.6* 102.0* 102.4*  PLT 145* 117* 119* 126* 145*   Cardiac Enzymes: No results for input(s): CKTOTAL, CKMB, CKMBINDEX, TROPONINI in the last 168 hours. CBG: Recent Labs  Lab 12/05/18 0747 12/05/18 1126 12/05/18 1543 12/05/18 1959 12/05/18 2328  GLUCAP 113* 93 100* 104* 84    Iron Studies: No results for input(s):  IRON, TIBC, TRANSFERRIN, FERRITIN in the last 72 hours. Studies/Results: Dg Chest Port 1 View  Result Date: 12/05/2018 CLINICAL DATA:  Pneumothorax. EXAM: PORTABLE CHEST 1 VIEW COMPARISON:  12/05/2018 FINDINGS: Interval placement of right pleural pigtail drainage catheter with re-expansion of the previously seen moderate right pneumothorax. No definite residual pneumothorax visualized on the current exam. Hypoinflated lungs and stable bibasilar opacification which may be due to small effusions with atelectasis or infection. Cardiomediastinal silhouette and remainder of the exam is unchanged. IMPRESSION: Stable bibasilar opacification which may be due to small bilateral effusions with atelectasis versus infection. Interval placement of right pleural pigtail catheter with re-expansion of the previously seen pneumothorax. No current pneumothorax visualized. Electronically Signed   By: Marin Olp M.D.    On: 12/05/2018 15:02   Dg Chest Port 1 View  Result Date: 12/05/2018 CLINICAL DATA:  Status post aortic valve replacement, 12/02/2018. Status post chest tube removal. EXAM: PORTABLE CHEST 1 VIEW COMPARISON:  12/04/2018 and earlier exams. FINDINGS: Right pneumothorax, increased in size from the previous exam following right chest tube removal. Estimated at 30%. No left pneumothorax. There is persistent opacity at both lung bases consistent with a combination of atelectasis and small effusions. Remainder of the lungs is clear. No mediastinal widening. Changes from the recent cardiac surgery and valve replacement stable. All lines and tubes have been removed. IMPRESSION: 1. Status post aortic valve replacement. All lines and tubes have been removed. 2. Increase in the size of the right pneumothorax, now estimated 30%. 3. Persistent lung base opacity consistent with small effusions and atelectasis. Electronically Signed   By: Lajean Manes M.D.   On: 12/05/2018 09:02   Medications: Infusions: . sodium chloride    . sodium chloride    . sodium chloride      Scheduled Medications: . acetaminophen  1,000 mg Oral Q6H  . amLODipine  10 mg Oral Daily  . bisacodyl  10 mg Oral Daily   Or  . bisacodyl  10 mg Rectal Daily  . calcitRIOL  3 mcg Oral Q M,W,F-HD  . Chlorhexidine Gluconate Cloth  6 each Topical Daily  . Chlorhexidine Gluconate Cloth  6 each Topical Q0600  . [START ON 12/09/2018] cloNIDine  0.3 mg Transdermal Weekly  . darbepoetin (ARANESP) injection - DIALYSIS  150 mcg Intravenous Q Fri-HD  . docusate sodium  200 mg Oral Daily  . enoxaparin (LOVENOX) injection  30 mg Subcutaneous Q24H  . labetalol  300 mg Oral BID  . lanthanum  2,000 mg Oral TID WC  . mouth rinse  15 mL Mouth Rinse BID  . multivitamin  1 tablet Oral QHS  . pantoprazole  40 mg Oral BID  . predniSONE  10 mg Oral Q breakfast  . sodium chloride flush  3 mL Intravenous Q12H  . warfarin  2.5 mg Oral q1800  . Warfarin -  Physician Dosing Inpatient   Does not apply q1800    have reviewed scheduled and prn medications.  Physical Exam: General: more conversational- in pain  Heart: RRR Lungs: poor effort- new pigtail  Abdomen: soft Extremities: min edema Dialysis Access: right AVF- patent     12/06/2018,7:09 AM  LOS: 4 days

## 2018-12-06 NOTE — Progress Notes (Signed)
     [address]       [place] [address]             [phone]       No events.  Tolerated dialysis yesterday  Vitals:   12/06/18 0800 12/06/18 0900  BP: (!) 142/76 (!) 153/79  Pulse: 82 84  Resp: 17 17  Temp:    SpO2: 100% 95%   I/O last 3 completed shifts: In: 1010 [P.O.:1010] Out: 729 [Urine:10; Other:609; Chest Tube:110]  Alert NAD RRR, incision clean EWOB, CT minimal drainage  CBC Latest Ref Rng & Units 12/06/2018 12/05/2018 12/05/2018  WBC 4.0 - 10.5 K/uL 8.9 11.2(H) 9.7  Hemoglobin 12.0 - 15.0 g/dL 8.3(L) 8.2(L) 8.2(L)  Hematocrit 36.0 - 46.0 % 26.3(L) 26.1(L) 25.9(L)  Platelets 150 - 400 K/uL 121(L) 145(L) 126(L)   BMP Latest Ref Rng & Units 12/06/2018 12/05/2018 12/05/2018  Glucose 70 - 99 mg/dL 108(H) 109(H) 95  BUN 6 - 20 mg/dL 43(H) 65(H) 61(H)  Creatinine 0.44 - 1.00 mg/dL 5.64(H) 7.33(H) 7.06(H)  Sodium 135 - 145 mmol/L 130(L) 128(L) 130(L)  Potassium 3.5 - 5.1 mmol/L 5.0 5.8(H) 5.7(H)  Chloride 98 - 111 mmol/L 91(L) 90(L) 91(L)  CO2 22 - 32 mmol/L 25 20(L) 22  Calcium 8.9 - 10.3 mg/dL 9.1 8.7(L) 8.7(L)   INR 1.5    POD 4 s/p MVR/AVR, right pneumoT - Neuro: stable - CV: on amlodipine, clonidine, labetolol, and coumadin - Pulm: wean O2 as tolerated - Renal: removed 600 during dialysis.  + 2.5L since admission.  Next dialysis on Monday - Heme: stable, INR trending up - ID: afebrile - Dispo: transfer to the floor.

## 2018-12-07 LAB — GLUCOSE, CAPILLARY
Glucose-Capillary: 77 mg/dL (ref 70–99)
Glucose-Capillary: 99 mg/dL (ref 70–99)

## 2018-12-07 LAB — RENAL FUNCTION PANEL
Albumin: 2.8 g/dL — ABNORMAL LOW (ref 3.5–5.0)
Anion gap: 19 — ABNORMAL HIGH (ref 5–15)
BUN: 66 mg/dL — ABNORMAL HIGH (ref 6–20)
CO2: 20 mmol/L — ABNORMAL LOW (ref 22–32)
Calcium: 9.5 mg/dL (ref 8.9–10.3)
Chloride: 90 mmol/L — ABNORMAL LOW (ref 98–111)
Creatinine, Ser: 7.37 mg/dL — ABNORMAL HIGH (ref 0.44–1.00)
GFR calc Af Amer: 7 mL/min — ABNORMAL LOW (ref >60)
GFR calc non Af Amer: 6 mL/min — ABNORMAL LOW (ref >60)
Glucose, Bld: 72 mg/dL (ref 70–99)
Phosphorus: 8.2 mg/dL — ABNORMAL HIGH (ref 2.5–4.6)
Potassium: 5.5 mmol/L — ABNORMAL HIGH (ref 3.5–5.1)
Sodium: 129 mmol/L — ABNORMAL LOW (ref 135–145)

## 2018-12-07 LAB — CBC
HCT: 25 % — ABNORMAL LOW (ref 36.0–46.0)
Hemoglobin: 7.8 g/dL — ABNORMAL LOW (ref 12.0–15.0)
MCH: 32.4 pg (ref 26.0–34.0)
MCHC: 31.2 g/dL (ref 30.0–36.0)
MCV: 103.7 fL — ABNORMAL HIGH (ref 80.0–100.0)
Platelets: 153 10*3/uL (ref 150–400)
RBC: 2.41 MIL/uL — ABNORMAL LOW (ref 3.87–5.11)
RDW: 18.4 % — ABNORMAL HIGH (ref 11.5–15.5)
WBC: 7.1 10*3/uL (ref 4.0–10.5)
nRBC: 1.5 % — ABNORMAL HIGH (ref 0.0–0.2)

## 2018-12-07 LAB — PROTIME-INR
INR: 1.7 — ABNORMAL HIGH (ref 0.8–1.2)
Prothrombin Time: 20 seconds — ABNORMAL HIGH (ref 11.4–15.2)

## 2018-12-07 MED ORDER — CHLORHEXIDINE GLUCONATE CLOTH 2 % EX PADS
6.0000 | MEDICATED_PAD | Freq: Every day | CUTANEOUS | Status: DC
  Administered 2018-12-07 – 2018-12-11 (×5): 6 via TOPICAL

## 2018-12-07 NOTE — Progress Notes (Signed)
Subjective:  Moved to floor bed-  BP is reasonable on meds- has OJ and banana on tray Objective Vital signs in last 24 hours: Vitals:   12/06/18 2000 12/06/18 2039 12/06/18 2325 12/07/18 0416  BP: (!) 150/91 (!) 155/78 (!) 142/73 (!) 143/81  Pulse: 82 83 77 78  Resp: 13 20 (!) 23 16  Temp:  97.8 F (36.6 C) 97.7 F (36.5 C) 98.1 F (36.7 C)  TempSrc:  Oral Oral Oral  SpO2: 94% 94% 97% 98%  Weight:  67.4 kg  66.9 kg  Height:       Weight change: 0.7 kg  Intake/Output Summary (Last 24 hours) at 12/07/2018 0832 Last data filed at 12/07/2018 0600 Gross per 24 hour  Intake 240 ml  Output 56 ml  Net 184 ml    Dialysis Orders:  MWF - [facility]  3.5hrs, BFR 450, DFR 800,  [name] 61.5kg, 2K/ 2Ca  Access: RU AVF  Heparin 5000 unit bolus Mircera 200 mcg q2wks - last on 7/3 Calcitriol 3 mcg PO qHD  Assessment/Plan: 1.  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 Per CTS.  Had to have pigtail pleural tube placed but otherwise recovering extremely well 2.  ESRD -  Normally On HD MWF. K 4.3 pre surgery.     HD Wed and Friday on schedule-  Next  Monday via AVF ,  But will watch K 3.  Hypertension/Volume -  Does not appear volume overloaded on exam but very positive and 5 kg above [name] with low sodium pre HD.  Discussed with patient as she limited UF with HD yest.  Removal with next HD as able.  On many antihypertensives as OP -here norvasc 10/clonidine #3 patch/labetalol 300 BID- BP is reasonable 4.  Anemia of CKD - Hgb stable in the 8's. Given  ESA Friday.  Follow trend post surgery and transfuse as indicated.  5.  Secondary Hyperparathyroidism -  OP Ca at goal. Phos elevated (over 10).  Cont calcitriol and fosrenol 2000 with meals- phos down to 7.4 most recently  6.  Nutrition - Renal diet once advanced, Vit 7. Pulmonary HTN 8.  Hyperkalemia-   watch and use K binders/dialysis over weekend as needed - change diet to potassium restrict      [place]: Basic  Metabolic Panel: Recent Labs  Lab 12/05/18 0322 12/05/18 0808 12/06/18 0814  NA 130* 128* 130*  K 5.7* 5.8* 5.0  CL 91* 90* 91*  CO2 22 20* 25  GLUCOSE 95 109* 108*  BUN 61* 65* 43*  CREATININE 7.06* 7.33* 5.64*  CALCIUM 8.7* 8.7* 9.1  PHOS 10.9* 10.9* 7.4*   Liver Function Tests: Recent Labs  Lab 12/05/18 0322 12/05/18 0808 12/06/18 0814  ALBUMIN 2.8* 2.9* 2.8*   No results for input(s): LIPASE, AMYLASE in the last 168 hours. No results for input(s): AMMONIA in the last 168 hours. CBC: Recent Labs  Lab 12/03/18 1646 12/04/18 0433 12/05/18 0322 12/05/18 0808 12/06/18 0601  WBC 8.2 9.1 9.7 11.2* 8.9  HGB 8.6* 8.4* 8.2* 8.2* 8.3*  HCT 27.7* 27.2* 25.9* 26.1* 26.3*  MCV 104.1* 104.6* 102.0* 102.4* 102.7*  PLT 117* 119* 126* 145* 121*   Cardiac Enzymes: No results for input(s): CKTOTAL, CKMB, CKMBINDEX, TROPONINI in the last 168 hours. CBG: Recent Labs  Lab 12/05/18 1126 12/05/18 1543 12/05/18 1959 12/05/18 2328 12/07/18 0615  GLUCAP 93 100* 104* 84 77    Iron Studies: No results  for input(s): IRON, TIBC, TRANSFERRIN, FERRITIN in the last 72 hours. Studies/Results: Dg Chest Port 1 View  Result Date: 12/06/2018 CLINICAL DATA:  Pneumothorax. EXAM: PORTABLE CHEST 1 VIEW COMPARISON:  Radiograph of December 05, 2018. FINDINGS: Stable cardiomegaly. Status post cardiac valve repair. Atherosclerosis of thoracic aorta is noted. Stable right-sided chest tube is noted without definite pneumothorax. Stable bibasilar opacities are noted concerning for atelectasis or infiltrates with probable associated effusions. Bony thorax is unremarkable. IMPRESSION: Stable position of right-sided chest tube without definite pneumothorax. Stable bibasilar opacities as described above. Aortic Atherosclerosis (ICD10-I70.0). Electronically Signed   By: Marijo Conception M.D.   On: 12/06/2018 08:24   Dg Chest Port 1 View  Result Date: 12/05/2018 CLINICAL DATA:  Pneumothorax. EXAM: PORTABLE CHEST  1 VIEW COMPARISON:  12/05/2018 FINDINGS: Interval placement of right pleural pigtail drainage catheter with re-expansion of the previously seen moderate right pneumothorax. No definite residual pneumothorax visualized on the current exam. Hypoinflated lungs and stable bibasilar opacification which may be due to small effusions with atelectasis or infection. Cardiomediastinal silhouette and remainder of the exam is unchanged. IMPRESSION: Stable bibasilar opacification which may be due to small bilateral effusions with atelectasis versus infection. Interval placement of right pleural pigtail catheter with re-expansion of the previously seen pneumothorax. No current pneumothorax visualized. Electronically Signed   By: Marin Olp M.D.   On: 12/05/2018 15:02   Medications: Infusions: . sodium chloride    . sodium chloride    . sodium chloride      Scheduled Medications: . acetaminophen  1,000 mg Oral Q6H  . amLODipine  10 mg Oral Daily  . bisacodyl  10 mg Oral Daily   Or  . bisacodyl  10 mg Rectal Daily  . calcitRIOL  3 mcg Oral Q M,W,F-HD  . Chlorhexidine Gluconate Cloth  6 each Topical Daily  . Chlorhexidine Gluconate Cloth  6 each Topical Q0600  . [START ON 12/09/2018] cloNIDine  0.3 mg Transdermal Weekly  . darbepoetin (ARANESP) injection - DIALYSIS  150 mcg Intravenous Q Fri-HD  . docusate sodium  200 mg Oral Daily  . enoxaparin (LOVENOX) injection  30 mg Subcutaneous Q24H  . labetalol  300 mg Oral BID  . lanthanum  2,000 mg Oral TID WC  . mouth rinse  15 mL Mouth Rinse BID  . multivitamin  1 tablet Oral QHS  . pantoprazole  40 mg Oral BID  . patiromer  8.4 g Oral Daily  . predniSONE  10 mg Oral Q breakfast  . sodium chloride flush  3 mL Intravenous Q12H  . warfarin  2.5 mg Oral q1800  . Warfarin - Physician Dosing Inpatient   Does not apply q1800    have reviewed scheduled and prn medications.  Physical Exam: General: looks better each day Heart: RRR Lungs: poor effort- new  pigtail  Abdomen: soft Extremities: min edema Dialysis Access: right AVF- patent     12/07/2018,8:32 AM  LOS: 5 days

## 2018-12-07 NOTE — Progress Notes (Addendum)
[address]       [place] [address]             [phone]      5 Days Post-Op Procedure(s) (LRB): AORTIC VALVE REPLACEMENT (AVR) USING CARBOMEDICS SUPRA-ANNULAR TOP HAT SIZE 21MM (N/A) MITRAL VALVE (MV) REPLACEMENT USING CARBOMEDICS OPTIFORM SIZE 31MM (N/A) TRANSESOPHAGEAL ECHOCARDIOGRAM (TEE) (N/A) Subjective: conts to feel better  Objective: Vital signs in last 24 hours: Temp:  [97.7 F (36.5 C)-98.4 F (36.9 C)] 98.1 F (36.7 C) (07/19 0416) Pulse Rate:  [76-86] 78 (07/19 0416) Cardiac Rhythm: Junctional rhythm (07/18 2051) Resp:  [11-23] 16 (07/19 0416) BP: (121-155)/(68-91) 143/81 (07/19 0416) SpO2:  [90 %-98 %] 98 % (07/19 0416) FiO2 (%):  [2 %] 2 % (07/19 0416) Weight:  [66.9 kg-67.4 kg] 66.9 kg (07/19 0416)  Hemodynamic parameters for last 24 hours:    Intake/Output from previous day: 07/18 0701 - 07/19 0700 In: 480 [P.O.:480] Out: 56 [Chest Tube:56] Intake/Output this shift: No intake/output data recorded.  General appearance: alert, cooperative and no distress Heart: regular rate and rhythm Lungs: mildly dim right base, o/w clear Abdomen: soft, nontender Extremities: no edema Wound: incis healing well  Lab Results: Recent Labs    12/05/18 0808 12/06/18 0601  WBC 11.2* 8.9  HGB 8.2* 8.3*  HCT 26.1* 26.3*  PLT 145* 121*   BMET:  Recent Labs    12/05/18 0808 12/06/18 0814  NA 128* 130*  K 5.8* 5.0  CL 90* 91*  CO2 20* 25  GLUCOSE 109* 108*  BUN 65* 43*  CREATININE 7.33* 5.64*  CALCIUM 8.7* 9.1    PT/INR:  Recent Labs    12/06/18 0601  LABPROT 17.9*  INR 1.5*   ABG    Component Value Date/Time   PHART 7.264 (L) 12/02/2018 1829   HCO3 25.8 12/02/2018 1829   TCO2 28 12/02/2018 1829   ACIDBASEDEF 2.0 12/02/2018 1829   O2SAT 95.0 12/02/2018 1829   CBG (last 3)  Recent Labs    12/05/18 1959 12/05/18 2328 12/07/18 0615  GLUCAP 104* 84 77    Meds Scheduled Meds:  acetaminophen  1,000 mg Oral  Q6H   amLODipine  10 mg Oral Daily   bisacodyl  10 mg Oral Daily   Or   bisacodyl  10 mg Rectal Daily   calcitRIOL  3 mcg Oral Q M,W,F-HD   Chlorhexidine Gluconate Cloth  6 each Topical Daily   Chlorhexidine Gluconate Cloth  6 each Topical Q0600   Chlorhexidine Gluconate Cloth  6 each Topical Q0600   [START ON 12/09/2018] cloNIDine  0.3 mg Transdermal Weekly   darbepoetin (ARANESP) injection - DIALYSIS  150 mcg Intravenous Q Fri-HD   docusate sodium  200 mg Oral Daily   enoxaparin (LOVENOX) injection  30 mg Subcutaneous Q24H   labetalol  300 mg Oral BID   lanthanum  2,000 mg Oral TID WC   mouth rinse  15 mL Mouth Rinse BID   multivitamin  1 tablet Oral QHS   pantoprazole  40 mg Oral BID   patiromer  8.4 g Oral Daily   predniSONE  10 mg Oral Q breakfast   sodium chloride flush  3 mL Intravenous Q12H   warfarin  2.5 mg Oral q1800   Warfarin - Physician Dosing Inpatient   Does not apply q1800   Continuous Infusions:  sodium chloride     sodium chloride     sodium chloride     PRN Meds:.sodium  chloride, sodium chloride, alteplase, heparin, labetalol, lidocaine (PF), lidocaine-prilocaine, morphine injection, ondansetron (ZOFRAN) IV, oxyCODONE, pentafluoroprop-tetrafluoroeth, sodium chloride flush, traMADol  Xrays Dg Chest Port 1 View  Result Date: 12/06/2018 CLINICAL DATA:  Pneumothorax. EXAM: PORTABLE CHEST 1 VIEW COMPARISON:  Radiograph of December 05, 2018. FINDINGS: Stable cardiomegaly. Status post cardiac valve repair. Atherosclerosis of thoracic aorta is noted. Stable right-sided chest tube is noted without definite pneumothorax. Stable bibasilar opacities are noted concerning for atelectasis or infiltrates with probable associated effusions. Bony thorax is unremarkable. IMPRESSION: Stable position of right-sided chest tube without definite pneumothorax. Stable bibasilar opacities as described above. Aortic Atherosclerosis (ICD10-I70.0). Electronically Signed    By: Marijo Conception M.D.   On: 12/06/2018 08:24   Dg Chest Port 1 View  Result Date: 12/05/2018 CLINICAL DATA:  Pneumothorax. EXAM: PORTABLE CHEST 1 VIEW COMPARISON:  12/05/2018 FINDINGS: Interval placement of right pleural pigtail drainage catheter with re-expansion of the previously seen moderate right pneumothorax. No definite residual pneumothorax visualized on the current exam. Hypoinflated lungs and stable bibasilar opacification which may be due to small effusions with atelectasis or infection. Cardiomediastinal silhouette and remainder of the exam is unchanged. IMPRESSION: Stable bibasilar opacification which may be due to small bilateral effusions with atelectasis versus infection. Interval placement of right pleural pigtail catheter with re-expansion of the previously seen pneumothorax. No current pneumothorax visualized. Electronically Signed   By: Marin Olp M.D.   On: 12/05/2018 15:02    Assessment/Plan: S/P Procedure(s) (LRB): AORTIC VALVE REPLACEMENT (AVR) USING CARBOMEDICS SUPRA-ANNULAR TOP HAT SIZE 21MM (N/A) MITRAL VALVE (MV) REPLACEMENT USING CARBOMEDICS OPTIFORM SIZE 31MM (N/A) TRANSESOPHAGEAL ECHOCARDIOGRAM (TEE) (N/A)  1 doing well POD#5 2 hemodyn stable in accel junctional. Some HTN, nephrology feels current meds/BP control are reasonable 3 HD- tomorrow per nephrology 4 chest tube, min drainage - will place to H2O seal, check CXR in am 5 INR pending- no new labs 6 BS well controlled 7 routine pulm toilet/cardiac rehab  LOS: 5 days    John Giovanni PA-C 12/07/2018 Pager [phone]  Doing well HD tomorrow CT to [place] today [place]

## 2018-12-07 NOTE — Plan of Care (Signed)
Will continue to monitor.

## 2018-12-08 ENCOUNTER — Inpatient Hospital Stay (HOSPITAL_COMMUNITY): Payer: Medicare Other

## 2018-12-08 LAB — PROTIME-INR
INR: 2.1 — ABNORMAL HIGH (ref 0.8–1.2)
Prothrombin Time: 23.6 seconds — ABNORMAL HIGH (ref 11.4–15.2)

## 2018-12-08 LAB — RENAL FUNCTION PANEL
Albumin: 2.7 g/dL — ABNORMAL LOW (ref 3.5–5.0)
Anion gap: 20 — ABNORMAL HIGH (ref 5–15)
BUN: 79 mg/dL — ABNORMAL HIGH (ref 6–20)
CO2: 19 mmol/L — ABNORMAL LOW (ref 22–32)
Calcium: 8.9 mg/dL (ref 8.9–10.3)
Chloride: 88 mmol/L — ABNORMAL LOW (ref 98–111)
Creatinine, Ser: 8.65 mg/dL — ABNORMAL HIGH (ref 0.44–1.00)
GFR calc Af Amer: 5 mL/min — ABNORMAL LOW (ref >60)
GFR calc non Af Amer: 5 mL/min — ABNORMAL LOW (ref >60)
Glucose, Bld: 65 mg/dL — ABNORMAL LOW (ref 70–99)
Phosphorus: 9.3 mg/dL — ABNORMAL HIGH (ref 2.5–4.6)
Potassium: 6 mmol/L — ABNORMAL HIGH (ref 3.5–5.1)
Sodium: 127 mmol/L — ABNORMAL LOW (ref 135–145)

## 2018-12-08 LAB — GLUCOSE, CAPILLARY: Glucose-Capillary: 91 mg/dL (ref 70–99)

## 2018-12-08 LAB — CBC
HCT: 23.2 % — ABNORMAL LOW (ref 36.0–46.0)
Hemoglobin: 7.6 g/dL — ABNORMAL LOW (ref 12.0–15.0)
MCH: 33 pg (ref 26.0–34.0)
MCHC: 32.8 g/dL (ref 30.0–36.0)
MCV: 100.9 fL — ABNORMAL HIGH (ref 80.0–100.0)
Platelets: 194 10*3/uL (ref 150–400)
RBC: 2.3 MIL/uL — ABNORMAL LOW (ref 3.87–5.11)
RDW: 18.6 % — ABNORMAL HIGH (ref 11.5–15.5)
WBC: 7.4 10*3/uL (ref 4.0–10.5)
nRBC: 0.9 % — ABNORMAL HIGH (ref 0.0–0.2)

## 2018-12-08 MED ORDER — DARBEPOETIN ALFA 200 MCG/0.4ML IJ SOSY: 200.0000 ug | PREFILLED_SYRINGE | INTRAMUSCULAR | Status: DC

## 2018-12-08 MED ORDER — PREDNISONE 5 MG PO TABS
5.0000 mg | ORAL_TABLET | Freq: Every day | ORAL | Status: DC
  Administered 2018-12-09 – 2018-12-11 (×3): 5 mg via ORAL
  Filled 2018-12-08 (×3): qty 1

## 2018-12-08 MED ORDER — TRAMADOL HCL 50 MG PO TABS
ORAL_TABLET | ORAL | Status: AC
  Filled 2018-12-08: qty 2

## 2018-12-08 NOTE — Progress Notes (Addendum)
[facility] Progress Note   Subjective:   Patient seen and examined at bedside.  States she is doing ok and is supposed to get some more of her lines out this morning.  Denies SOB.    Objective Vitals:   12/08/18 1030 12/08/18 1045 12/08/18 1100 12/08/18 1221  BP: 120/74 121/69 128/68   Pulse: 72 72 73 76  Resp: (!) 21 13 (!) 9 14  Temp:    (!) 97.5 F (36.4 C)  TempSrc:    Oral  SpO2: 95% 96% 96% 97%  Weight:      Height:       Physical Exam General:NAD, pleasant female, sitting up in bed Heart:RRR, +systolic murmur, +JVD to angle of the jaw Lungs:mostly CTAB, breath sounds decreased on R Abdomen:soft, NTND Extremities:trace edema from feet to hips, R>L Dialysis Access: R AVF +b   Filed Weights   12/06/18 2039 12/07/18 0416 12/08/18 0349  Weight: 67.4 kg 66.9 kg 63.5 kg    Intake/Output Summary (Last 24 hours) at 12/08/2018 1236 Last data filed at 12/08/2018 0959 Gross per 24 hour  Intake 562 ml  Output 60 ml  Net 502 ml    Additional Objective Labs: Basic Metabolic Panel: Recent Labs  Lab 12/06/18 0814 12/07/18 0844 12/08/18 0317  NA 130* 129* 127*  K 5.0 5.5* 6.0*  CL 91* 90* 88*  CO2 25 20* 19*  GLUCOSE 108* 72 65*  BUN 43* 66* 79*  CREATININE 5.64* 7.37* 8.65*  CALCIUM 9.1 9.5 8.9  PHOS 7.4* 8.2* 9.3*   Liver Function Tests: Recent Labs  Lab 12/06/18 0814 12/07/18 0844 12/08/18 0317  ALBUMIN 2.8* 2.8* 2.7*   CBC: Recent Labs  Lab 12/04/18 0433 12/05/18 0322 12/05/18 0808 12/06/18 0601 12/07/18 0844  WBC 9.1 9.7 11.2* 8.9 7.1  HGB 8.4* 8.2* 8.2* 8.3* 7.8*  HCT 27.2* 25.9* 26.1* 26.3* 25.0*  MCV 104.6* 102.0* 102.4* 102.7* 103.7*  PLT 119* 126* 145* 121* 153   Blood Culture    Component Value Date/Time   SDES BLOOD LEFT HAND 10/21/2017 2110   SPECREQUEST  10/21/2017 2110    BOTTLES DRAWN AEROBIC AND ANAEROBIC Blood Culture adequate volume   CULT  10/21/2017 2110    NO GROWTH 5 DAYS Performed at [facility], [address] [place], [place] [address]    REPTSTATUS 10/26/2017 FINAL 10/21/2017 2110   CBG: Recent Labs  Lab 12/05/18 1959 12/05/18 2328 12/06/18 0756 12/07/18 0615 12/07/18 1159  GLUCAP 104* 84 91 77 99   Studies/Results: Dg Chest 2 View  Result Date: 12/08/2018 CLINICAL DATA:  Postoperative EXAM: CHEST - 2 VIEW COMPARISON:  12/06/2018 FINDINGS: No significant change in examination. There is a right-sided pigtail chest tube about the anterior pleural space. No significant pneumothorax. Unchanged small bilateral pleural effusions and associated atelectasis or consolidation. Cardiomegaly status post median sternotomy with mitral and aortic valve prostheses. IMPRESSION: No significant change in examination. There is a right-sided pigtail chest tube about the anterior pleural space. No significant pneumothorax. Unchanged small bilateral pleural effusions and associated atelectasis or consolidation. Cardiomegaly status post median sternotomy with mitral and aortic valve prostheses. Electronically Signed   By: Eddie Candle M.D.   On: 12/08/2018 08:19   Dg Chest Port 1 View  Result Date: 12/08/2018 CLINICAL DATA:  Status post right chest tube removal today. EXAM: PORTABLE CHEST 1 VIEW COMPARISON:  PA and lateral chest 12/08/2018. FINDINGS: Pigtail catheter in the right chest is been removed. No pneumothorax. Small bilateral  pleural effusions and basilar airspace disease are unchanged. The patient is status post aortic and mitral valve repair. IMPRESSION: Negative for pneumothorax with a right chest tube removal. No change in small bilateral pleural effusions and basilar atelectasis. Electronically Signed   By: Inge Rise M.D.   On: 12/08/2018 11:39    Medications: . sodium chloride    . sodium chloride    . sodium chloride     . amLODipine  10 mg Oral Daily  . bisacodyl  10 mg Oral Daily   Or  . bisacodyl  10 mg Rectal Daily  . calcitRIOL  3 mcg Oral Q M,W,F-HD  .  Chlorhexidine Gluconate Cloth  6 each Topical Daily  . Chlorhexidine Gluconate Cloth  6 each Topical Q0600  . Chlorhexidine Gluconate Cloth  6 each Topical Q0600  . [START ON 12/09/2018] cloNIDine  0.3 mg Transdermal Weekly  . darbepoetin (ARANESP) injection - DIALYSIS  150 mcg Intravenous Q Fri-HD  . docusate sodium  200 mg Oral Daily  . labetalol  300 mg Oral BID  . lanthanum  2,000 mg Oral TID WC  . mouth rinse  15 mL Mouth Rinse BID  . multivitamin  1 tablet Oral QHS  . pantoprazole  40 mg Oral BID  . patiromer  8.4 g Oral Daily  . [START ON 12/09/2018] predniSONE  5 mg Oral Q breakfast  . sodium chloride flush  3 mL Intravenous Q12H  . warfarin  2.5 mg Oral q1800  . Warfarin - Physician Dosing Inpatient   Does not apply q1800    Dialysis Orders: MWF -[facility] 3.5hrs, BFR450, [id number], [name] 61.5kg,2K/2Ca  Access:RU AVF Heparin5000 unit bolus Mircera248mcg q2wks - last on 7/3 Calcitriol63mcg PO qHD  Assessment/Plan: 1. 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 Per CTS.  chest tube to be removed today.  2. ESRD - On HD MWF. K 6.0 HD today per regular schedule.  3. Hypertension/Volume -  BP at goal with current meds - here norvasc 10/clonidine #3 patch/labetalol 300 BID.  Some increased volume on exam, 2kg over edw.  Titrate down as tolerated.   4. Anemia of CKD - Hgb decreased to 7.8. Aranesp 152mcg qFri, ^200.Follow trend and transfuse as indicated.  5. Secondary Hyperparathyroidism - OP Ca at goal. Phos elevated (over 10). Cont calcitriol and fosrenol 2000 with meals- phos 9.3 most recently. Add with snacks 6. Nutrition - Renal/potassium restricted diet, Vit 7. Pulmonary HTN 8.  Hyperkalemia-   follow, use K binders as needed - diet changed to potassium restrict   Jen Mow, PA-C [facility] Pager: [phone] 12/08/2018,12:36 PM  LOS: 6 days   Pt seen, examined and agree w A/P as above.  [name]  MD 12/08/2018,  3:07 PM

## 2018-12-08 NOTE — Care Management Important Message (Signed)
Important Message  Patient Details  Name: [name] MRN: [id number] Date of Birth: [birth year]   Medicare Important Message Given:  Yes     Shelda Altes 12/08/2018, 1:54 PM

## 2018-12-08 NOTE — Progress Notes (Signed)
Offered to walk with pt. Pt declining at this time stating she is too sleepy from the pain medicine she received earlier. Will return later as time allows and continue to encourage ambulation.  Rufina Falco, RN BSN 12/08/2018 11:27 AM

## 2018-12-08 NOTE — Progress Notes (Signed)
Offered to walk pt before HD. Pt refused and said she would walk after HD. Explained the importance of ambulation for recovery and the goal to walk at least 3X per day. Will attempt to walk w/ pt later today.  Clyde Canterbury, RN

## 2018-12-08 NOTE — Progress Notes (Addendum)
[address]       ,[place] [address]             [phone]      6 Days Post-Op Procedure(s) (LRB): AORTIC VALVE REPLACEMENT (AVR) USING CARBOMEDICS SUPRA-ANNULAR TOP HAT SIZE 21MM (N/A) MITRAL VALVE (MV) REPLACEMENT USING CARBOMEDICS OPTIFORM SIZE 31MM (N/A) TRANSESOPHAGEAL ECHOCARDIOGRAM (TEE) (N/A) Subjective: Some nausea this am, o/w feels pretty well  Objective: Vital signs in last 24 hours: Temp:  [97.6 F (36.4 C)-98.9 F (37.2 C)] 97.6 F (36.4 C) (07/20 0349) Pulse Rate:  [75-84] 84 (07/20 0349) Cardiac Rhythm: Junctional rhythm;Other (Comment) (07/19 1903) Resp:  [13-25] 16 (07/20 0349) BP: (119-157)/(64-82) 153/79 (07/20 0349) SpO2:  [90 %-98 %] 93 % (07/20 0349) Weight:  [63.5 kg] 63.5 kg (07/20 0349)  Hemodynamic parameters for last 24 hours:    Intake/Output from previous day: 07/19 0701 - 07/20 0700 In: 542 [P.O.:542] Out: 60 [Chest Tube:60] Intake/Output this shift: No intake/output data recorded.  General appearance: alert, cooperative and no distress Heart: regular rate and rhythm and + systolic murmur Lungs: mildly dim in bases Abdomen: benign Extremities: minoe LE edema Wound: incis healing well  Lab Results: Recent Labs    12/06/18 0601 12/07/18 0844  WBC 8.9 7.1  HGB 8.3* 7.8*  HCT 26.3* 25.0*  PLT 121* 153   BMET:  Recent Labs    12/07/18 0844 12/08/18 0317  NA 129* 127*  K 5.5* 6.0*  CL 90* 88*  CO2 20* 19*  GLUCOSE 72 65*  BUN 66* 79*  CREATININE 7.37* 8.65*  CALCIUM 9.5 8.9    PT/INR:  Recent Labs    12/08/18 0317  LABPROT 23.6*  INR 2.1*   ABG    Component Value Date/Time   PHART 7.264 (L) 12/02/2018 1829   HCO3 25.8 12/02/2018 1829   TCO2 28 12/02/2018 1829   ACIDBASEDEF 2.0 12/02/2018 1829   O2SAT 95.0 12/02/2018 1829   CBG (last 3)  Recent Labs    12/05/18 2328 12/07/18 0615 12/07/18 1159  GLUCAP 84 77 99    Meds Scheduled Meds: . amLODipine  10 mg Oral Daily  .  bisacodyl  10 mg Oral Daily   Or  . bisacodyl  10 mg Rectal Daily  . calcitRIOL  3 mcg Oral Q M,W,F-HD  . Chlorhexidine Gluconate Cloth  6 each Topical Daily  . Chlorhexidine Gluconate Cloth  6 each Topical Q0600  . Chlorhexidine Gluconate Cloth  6 each Topical Q0600  . [START ON 12/09/2018] cloNIDine  0.3 mg Transdermal Weekly  . darbepoetin (ARANESP) injection - DIALYSIS  150 mcg Intravenous Q Fri-HD  . docusate sodium  200 mg Oral Daily  . enoxaparin (LOVENOX) injection  30 mg Subcutaneous Q24H  . labetalol  300 mg Oral BID  . lanthanum  2,000 mg Oral TID WC  . mouth rinse  15 mL Mouth Rinse BID  . multivitamin  1 tablet Oral QHS  . pantoprazole  40 mg Oral BID  . patiromer  8.4 g Oral Daily  . predniSONE  10 mg Oral Q breakfast  . sodium chloride flush  3 mL Intravenous Q12H  . warfarin  2.5 mg Oral q1800  . Warfarin - Physician Dosing Inpatient   Does not apply q1800   Continuous Infusions: . sodium chloride    . sodium chloride    . sodium chloride     PRN Meds:.sodium chloride, sodium chloride, alteplase, heparin, labetalol, lidocaine (PF), lidocaine-prilocaine, morphine injection,  ondansetron (ZOFRAN) IV, oxyCODONE, pentafluoroprop-tetrafluoroeth, sodium chloride flush, traMADol  Xrays No results found.  Assessment/Plan: S/P Procedure(s) (LRB): AORTIC VALVE REPLACEMENT (AVR) USING CARBOMEDICS SUPRA-ANNULAR TOP HAT SIZE 21MM (N/A) MITRAL VALVE (MV) REPLACEMENT USING CARBOMEDICS OPTIFORM SIZE 31MM (N/A) TRANSESOPHAGEAL ECHOCARDIOGRAM (TEE) (N/A)   1 afebrile, hemodyn stable with some HTN- will defer further meds to nephrology for htn. Accel junct rhythm- stable rate 2 for dialysis today 3 CT - no air leak on H2O seal- CXR without pntx- d/c tube today 4 O2 sats ok on 1.5 liters- cont pulm toilet/wean as able 5 INR 2.1, cont coumadin , may need to reduce dose some if rises too precipitously, cont same dose for now with mechanical valves 6 hyponatremia, monitor- should  improve with volume status improvements 7 push rehab as able 8 BS well controlled  LOS: 6 days    [name] [facility] 12/08/2018 Pager [phone]  I have seen and examined the patient and agree with the assessment and plan as outlined.  Stop Lovenox.  Continue Coumadin 2.5 mg/day for now but watch INR closely.  Watch anemia.  D/C right pleural tube.  HD per Nephrology.  Rexene Alberts, MD 12/08/2018 9:16 AM

## 2018-12-08 NOTE — Progress Notes (Signed)
CARDIAC REHAB PHASE I       SaO2: 100 1L    SaO2: 87 RA  Offered again to walk with pt. Pt declining, at this time. Pts lunch tray recently arrived, helped pt to edge of bed. During transfer pt removed her oxygen. After sitting on edge of bed for a short time, pts oxygen level noted to be decreasing and work of breath increased. Pt replaced oxygen and sats began to rise. Pt educated on importance of walks and IS use so she can get off oxygen prior to being discharged. Reinforced sternal precautions. Will continue to follow and encourage pts progression.  [id number] Rufina Falco, RN BSN 12/08/2018 1:49 PM

## 2018-12-08 NOTE — Progress Notes (Signed)
R CT removed per order. EPW removed per order. VSS. Pt tolerated well. Educated pt on 1 hour bedrest. Call light in reach. Radiology called to schedule follow up chest xray.  Clyde Canterbury, RN

## 2018-12-09 ENCOUNTER — Inpatient Hospital Stay (HOSPITAL_COMMUNITY): Payer: Medicare Other

## 2018-12-09 LAB — PROTIME-INR
INR: 2.1 — ABNORMAL HIGH (ref 0.8–1.2)
Prothrombin Time: 23.5 seconds — ABNORMAL HIGH (ref 11.4–15.2)

## 2018-12-09 MED ORDER — CHLORHEXIDINE GLUCONATE CLOTH 2 % EX PADS: 6.0000 | MEDICATED_PAD | Freq: Every day | CUTANEOUS | Status: DC

## 2018-12-09 NOTE — Progress Notes (Signed)
[facility] Progress Note   Subjective:   Patient seen in room. 1.6 L off yest on HD.   Objective Vitals:   12/09/18 0413 12/09/18 0826 12/09/18 0833 12/09/18 1059  BP: 131/86 (!) 141/74 (!) 141/74 124/71  Pulse: 90  98   Resp: 10 19  16   Temp: 98.1 F (36.7 C) 97.6 F (36.4 C)  97.7 F (36.5 C)  TempSrc: Oral Oral  Oral  SpO2: 100% 100%  98%  Weight: 65.6 kg     Height:       Physical Exam General:NAD, pleasant female, sitting up in bed Heart: RRR w/ sem 2/6 Lungs:mostly CTAB Abdomen:soft, NTND Extremities: LE edema better, mostly gone Dialysis Access: R AVF +b   Filed Weights   12/08/18 1530 12/08/18 1908 12/09/18 0413  Weight: 67.9 kg 66 kg 65.6 kg    Intake/Output Summary (Last 24 hours) at 12/09/2018 1216 Last data filed at 12/09/2018 0940 Gross per 24 hour  Intake 140 ml  Output 1596 ml  Net -1456 ml    Additional Objective Labs: Basic Metabolic Panel: Recent Labs  Lab 12/07/18 0844 12/08/18 0317 12/09/18 0256  NA 129* 127* 134*  K 5.5* 6.0* 4.2  CL 90* 88* 95*  CO2 20* 19* 25  GLUCOSE 72 65* 93  BUN 66* 79* 32*  CREATININE 7.37* 8.65* 4.74*  CALCIUM 9.5 8.9 8.8*  PHOS 8.2* 9.3* 5.4*   Liver Function Tests: Recent Labs  Lab 12/07/18 0844 12/08/18 0317 12/09/18 0256  ALBUMIN 2.8* 2.7* 2.6*   CBC: Recent Labs  Lab 12/05/18 0322 12/05/18 0808 12/06/18 0601 12/07/18 0844 12/08/18 1541  WBC 9.7 11.2* 8.9 7.1 7.4  HGB 8.2* 8.2* 8.3* 7.8* 7.6*  HCT 25.9* 26.1* 26.3* 25.0* 23.2*  MCV 102.0* 102.4* 102.7* 103.7* 100.9*  PLT 126* 145* 121* 153 194   Blood Culture    Component Value Date/Time   SDES BLOOD LEFT HAND 10/21/2017 2110   SPECREQUEST  10/21/2017 2110    BOTTLES DRAWN AEROBIC AND ANAEROBIC Blood Culture adequate volume   CULT  10/21/2017 2110    NO GROWTH 5 DAYS Performed at [facility] [address], [place], [place] [address]    REPTSTATUS 10/26/2017 FINAL 10/21/2017 2110   CBG: Recent Labs   Lab 12/05/18 1959 12/05/18 2328 12/06/18 0756 12/07/18 0615 12/07/18 1159  GLUCAP 104* 84 91 77 99   Studies/Results: Dg Chest 2 View  Result Date: 12/09/2018 CLINICAL DATA:  Postop open heart surgery.  Pain. EXAM: CHEST - 2 VIEW COMPARISON:  12/08/2018 FINDINGS: Prior median sternotomy and valve repair. Bibasilar opacities, likely atelectasis. Possible small effusions. No pneumothorax. IMPRESSION: Continued bibasilar opacities, slightly improved, likely atelectasis. Suspect small effusions. Electronically Signed   By: Rolm Baptise M.D.   On: 12/09/2018 10:13   Dg Chest 2 View  Result Date: 12/08/2018 CLINICAL DATA:  Postoperative EXAM: CHEST - 2 VIEW COMPARISON:  12/06/2018 FINDINGS: No significant change in examination. There is a right-sided pigtail chest tube about the anterior pleural space. No significant pneumothorax. Unchanged small bilateral pleural effusions and associated atelectasis or consolidation. Cardiomegaly status post median sternotomy with mitral and aortic valve prostheses. IMPRESSION: No significant change in examination. There is a right-sided pigtail chest tube about the anterior pleural space. No significant pneumothorax. Unchanged small bilateral pleural effusions and associated atelectasis or consolidation. Cardiomegaly status post median sternotomy with mitral and aortic valve prostheses. Electronically Signed   By: Eddie Candle M.D.   On: 12/08/2018 08:19  Dg Chest Port 1 View  Result Date: 12/08/2018 CLINICAL DATA:  Status post right chest tube removal today. EXAM: PORTABLE CHEST 1 VIEW COMPARISON:  PA and lateral chest 12/08/2018. FINDINGS: Pigtail catheter in the right chest is been removed. No pneumothorax. Small bilateral pleural effusions and basilar airspace disease are unchanged. The patient is status post aortic and mitral valve repair. IMPRESSION: Negative for pneumothorax with a right chest tube removal. No change in small bilateral pleural effusions and  basilar atelectasis. Electronically Signed   By: Inge Rise M.D.   On: 12/08/2018 11:39    Medications: . sodium chloride    . sodium chloride    . sodium chloride     . amLODipine  10 mg Oral Daily  . bisacodyl  10 mg Oral Daily   Or  . bisacodyl  10 mg Rectal Daily  . calcitRIOL  3 mcg Oral Q M,W,F-HD  . Chlorhexidine Gluconate Cloth  6 each Topical Daily  . Chlorhexidine Gluconate Cloth  6 each Topical Q0600  . Chlorhexidine Gluconate Cloth  6 each Topical Q0600  . cloNIDine  0.3 mg Transdermal Weekly  . [START ON 12/12/2018] darbepoetin (ARANESP) injection - DIALYSIS  200 mcg Intravenous Q Fri-HD  . docusate sodium  200 mg Oral Daily  . labetalol  300 mg Oral BID  . lanthanum  2,000 mg Oral TID WC  . mouth rinse  15 mL Mouth Rinse BID  . multivitamin  1 tablet Oral QHS  . pantoprazole  40 mg Oral BID  . patiromer  8.4 g Oral Daily  . predniSONE  5 mg Oral Q breakfast  . sodium chloride flush  3 mL Intravenous Q12H  . warfarin  2.5 mg Oral q1800  . Warfarin - Physician Dosing Inpatient   Does not apply q1800    Dialysis Orders: [place] 3.5h   450/800   61.5kg   2K/2Ca   RUA AVF   Hep 5000 Mircera267mcg q2wks - last on 7/3 Calcitriol52mcg PO qHD  Assessment/Plan: 1. 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 Per CTS 2. ESRD -HD MWF. Plan HD tomorrow.  3. Hypertension/Volume -  BP at goal on meds, still up 3-4 kg by wts/ exam 4. Anemia of CKD - Hgb decreased to 7.6. Aranesp 148mcg qFri, ^200.Follow trend and transfuse as indicated.  5. Secondary Hyperparathyroidism - OP Ca at goal. Phos elevated (over 10). Cont calcitriol and fosrenol 2000 with meals- phos 9.3 most recently. Add with snacks 6. Nutrition - Renal/potassium restricted diet, Vit 7. Pulmonary HTN 8.  Hyperkalemia-   follow, use K binders as needed - diet changed to potassium restrict     [name]  MD 12/09/2018, 12:16 PM

## 2018-12-09 NOTE — Plan of Care (Signed)
  Problem: Clinical Measurements: Goal: Postoperative complications will be avoided or minimized Outcome: Progressing   Problem: Skin Integrity: Goal: Wound healing without signs and symptoms of infection Outcome: Progressing   Problem: Activity: Goal: Risk for activity intolerance will decrease Outcome: Not Progressing   Problem: Health Behavior/Discharge Planning: Goal: Ability to manage health-related needs will improve Outcome: Not Progressing

## 2018-12-09 NOTE — Plan of Care (Signed)
  Problem: Cardiac: Goal: Will achieve and/or maintain hemodynamic stability Outcome: Progressing   Problem: Clinical Measurements: Goal: Postoperative complications will be avoided or minimized Outcome: Progressing   Problem: Respiratory: Goal: Respiratory status will improve Outcome: Progressing   Problem: Skin Integrity: Goal: Wound healing without signs and symptoms of infection Outcome: Progressing   Problem: Activity: Goal: Risk for activity intolerance will decrease Outcome: Not Progressing

## 2018-12-09 NOTE — Progress Notes (Addendum)
7 Days Post-Op Procedure(s) (LRB): AORTIC VALVE REPLACEMENT (AVR) USING CARBOMEDICS SUPRA-ANNULAR TOP HAT SIZE 21MM (N/A) MITRAL VALVE (MV) REPLACEMENT USING CARBOMEDICS OPTIFORM SIZE 31MM (N/A) TRANSESOPHAGEAL ECHOCARDIOGRAM (TEE) (N/A) Subjective: Sitting up on the bedside this AM, still having some incisional soreness.    Objective: Vital signs in last 24 hours: Temp:  [97.5 F (36.4 C)-98.4 F (36.9 C)] 98.1 F (36.7 C) (07/21 0413) Pulse Rate:  [72-105] 90 (07/21 0413) Cardiac Rhythm: Normal sinus rhythm (07/21 0111) Resp:  [9-22] 10 (07/21 0413) BP: (118-146)/(39-86) 131/86 (07/21 0413) SpO2:  [95 %-100 %] 100 % (07/21 0413) Weight:  [65.6 kg-67.9 kg] 65.6 kg (07/21 0413)     Intake/Output from previous day: 07/20 0701 - 07/21 0700 In: 140 [P.O.:140] Out: 1596  Intake/Output this shift: No intake/output data recorded.  General appearance: alert, cooperative and no distress Neurologic: intact Heart: regular rate and rhythm Lungs: clear to auscultation bilaterally Extremities: No LE edema, all well perfused. Wound: The sternal incision and CT sites are dry and intact.   Lab Results: Recent Labs    12/07/18 0844 12/08/18 1541  WBC 7.1 7.4  HGB 7.8* 7.6*  HCT 25.0* 23.2*  PLT 153 194   BMET:  Recent Labs    12/08/18 0317 12/09/18 0256  NA 127* 134*  K 6.0* 4.2  CL 88* 95*  CO2 19* 25  GLUCOSE 65* 93  BUN 79* 32*  CREATININE 8.65* 4.74*  CALCIUM 8.9 8.8*    PT/INR:  Recent Labs    12/09/18 0256  LABPROT 23.5*  INR 2.1*   ABG    Component Value Date/Time   PHART 7.264 (L) 12/02/2018 1829   HCO3 25.8 12/02/2018 1829   TCO2 28 12/02/2018 1829   ACIDBASEDEF 2.0 12/02/2018 1829   O2SAT 95.0 12/02/2018 1829   CBG (last 3)  Recent Labs    12/07/18 0615 12/07/18 1159  GLUCAP 77 99    Assessment/Plan: S/P Procedure(s) (LRB): AORTIC VALVE REPLACEMENT (AVR) USING CARBOMEDICS SUPRA-ANNULAR TOP HAT SIZE 21MM (N/A) MITRAL VALVE (MV)  REPLACEMENT USING CARBOMEDICS OPTIFORM SIZE 31MM (N/A) TRANSESOPHAGEAL ECHOCARDIOGRAM (TEE) (N/A)  -POD7 AVR/MVR with mechanical prosthetic valves.  On Coumadin, INR 2.1. Continue to encourage activity, IS, wean O2.  -ESRD on HD-Tx planned for tomorrow.  -HTN-mgt per nephrology.  -Expected acute blood loss anemia  -Post-op PTX-Right pigtail catheter removed yesterday. CXR this AM shows both lungs well expanded and small right pleural effusion.  -Continue working on mobility and O2 wean today. Possible discharge after HD tomorrow or Thursday am.   LOS: 7 days    Antony Odea, PA-C [phone] 12/09/2018   I have seen and examined the patient and agree with the assessment and plan as outlined.  Possibly ready for d/c home tomorrow afternoon or Thursday morning after next HD treatment.  Rexene Alberts, MD 12/09/2018 2:56 PM

## 2018-12-09 NOTE — Progress Notes (Signed)
CARDIAC REHAB PHASE I   PRE:  Rate/Rhythm: 86 first deg    BP: sitting 139/82    SaO2: 98 2L  MODE:  Ambulation: 56 ft   POST:  Rate/Rhythm: 98 SR    BP: sitting 136/78     SaO2: 95 2L hall and after walk  Pt willing to walk but not excited. Moved to EOB and stood independently.  Walked with RW and 2L O2, assist x2. Fatigues easily, also SOB. SaO2 in hall 95 2L. Return to EOB after walk. Suddenly felt nauseated and immediately vomited a total of 60 mL. RN to get meds. To bed on O2. Encouraged more walking and IS. Sts she has had more phlegm production today. She sts she is not very active PTA. [phone]  [place], ACSM 12/09/2018 11:57 AM

## 2018-12-10 LAB — RENAL FUNCTION PANEL
Albumin: 2.6 g/dL — ABNORMAL LOW (ref 3.5–5.0)
Albumin: 2.6 g/dL — ABNORMAL LOW (ref 3.5–5.0)
Anion gap: 14 (ref 5–15)
Anion gap: 16 — ABNORMAL HIGH (ref 5–15)
BUN: 32 mg/dL — ABNORMAL HIGH (ref 6–20)
BUN: 49 mg/dL — ABNORMAL HIGH (ref 6–20)
CO2: 25 mmol/L (ref 22–32)
CO2: 24 mmol/L (ref 22–32)
Calcium: 8.8 mg/dL — ABNORMAL LOW (ref 8.9–10.3)
Calcium: 9.3 mg/dL (ref 8.9–10.3)
Chloride: 95 mmol/L — ABNORMAL LOW (ref 98–111)
Chloride: 93 mmol/L — ABNORMAL LOW (ref 98–111)
Creatinine, Ser: 4.74 mg/dL — ABNORMAL HIGH (ref 0.44–1.00)
Creatinine, Ser: 6.95 mg/dL — ABNORMAL HIGH (ref 0.44–1.00)
GFR calc Af Amer: 11 mL/min — ABNORMAL LOW (ref >60)
GFR calc Af Amer: 7 mL/min — ABNORMAL LOW (ref >60)
GFR calc non Af Amer: 10 mL/min — ABNORMAL LOW (ref >60)
GFR calc non Af Amer: 6 mL/min — ABNORMAL LOW (ref >60)
Glucose, Bld: 93 mg/dL (ref 70–99)
Glucose, Bld: 93 mg/dL (ref 70–99)
Phosphorus: 5.4 mg/dL — ABNORMAL HIGH (ref 2.5–4.6)
Phosphorus: 7.3 mg/dL — ABNORMAL HIGH (ref 2.5–4.6)
Potassium: 4.2 mmol/L (ref 3.5–5.1)
Potassium: 4.4 mmol/L (ref 3.5–5.1)
Sodium: 134 mmol/L — ABNORMAL LOW (ref 135–145)
Sodium: 133 mmol/L — ABNORMAL LOW (ref 135–145)

## 2018-12-10 LAB — CBC
HCT: 23.4 % — ABNORMAL LOW (ref 36.0–46.0)
Hemoglobin: 7.3 g/dL — ABNORMAL LOW (ref 12.0–15.0)
MCH: 32.9 pg (ref 26.0–34.0)
MCHC: 31.2 g/dL (ref 30.0–36.0)
MCV: 105.4 fL — ABNORMAL HIGH (ref 80.0–100.0)
Platelets: 222 10*3/uL (ref 150–400)
RBC: 2.22 MIL/uL — ABNORMAL LOW (ref 3.87–5.11)
RDW: 18.8 % — ABNORMAL HIGH (ref 11.5–15.5)
WBC: 6.9 10*3/uL (ref 4.0–10.5)
nRBC: 0.4 % — ABNORMAL HIGH (ref 0.0–0.2)

## 2018-12-10 LAB — PROTIME-INR
INR: 2.2 — ABNORMAL HIGH (ref 0.8–1.2)
Prothrombin Time: 23.8 seconds — ABNORMAL HIGH (ref 11.4–15.2)

## 2018-12-10 MED ORDER — HEPARIN SODIUM (PORCINE) 1000 UNIT/ML DIALYSIS: 2000.0000 [IU] | INTRAMUSCULAR | Status: DC | PRN

## 2018-12-10 NOTE — Progress Notes (Addendum)
8 Days Post-Op Procedure(s) (LRB): AORTIC VALVE REPLACEMENT (AVR) USING CARBOMEDICS SUPRA-ANNULAR TOP HAT SIZE 21MM (N/A) MITRAL VALVE (MV) REPLACEMENT USING CARBOMEDICS OPTIFORM SIZE 31MM (N/A) TRANSESOPHAGEAL ECHOCARDIOGRAM (TEE) (N/A) Subjective: Pt seen in the dialysis unit this AM. She reports no new problems.  She did a little more walking yesterday. She does no feel she can wean off remaining 1L O2.    Objective: Vital signs in last 24 hours: Temp:  [97.7 F (36.5 C)-98.3 F (36.8 C)] 98.3 F (36.8 C) (07/22 0745) Pulse Rate:  [29-100] 82 (07/22 0930) Cardiac Rhythm: Heart block (07/22 0712) Resp:  [12-20] 19 (07/22 0800) BP: (120-151)/(68-90) 134/68 (07/22 0930) SpO2:  [92 %-100 %] 98 % (07/22 0745) Weight:  [65.6 kg] 65.6 kg (07/22 0745)  Hemodynamic parameters for last 24 hours:    Intake/Output from previous day: 07/21 0701 - 07/22 0700 In: 140 [P.O.:140] Out: -  Intake/Output this shift: No intake/output data recorded.  Physical Exam General appearance: alert, cooperative and no distress Neurologic: intact Heart: regular rate and rhythm Lungs: clear to auscultation bilaterally Extremities: No LE edema, all well perfused. Wound: The sternal incision and CT sites are dry and intact.   Lab Results: Recent Labs    12/08/18 1541  WBC 7.4  HGB 7.6*  HCT 23.2*  PLT 194   BMET:  Recent Labs    12/09/18 0256 12/10/18 0252  NA 134* 133*  K 4.2 4.4  CL 95* 93*  CO2 25 24  GLUCOSE 93 93  BUN 32* 49*  CREATININE 4.74* 6.95*  CALCIUM 8.8* 9.3    PT/INR:  Recent Labs    12/10/18 0252  LABPROT 23.8*  INR 2.2*   ABG    Component Value Date/Time   PHART 7.264 (L) 12/02/2018 1829   HCO3 25.8 12/02/2018 1829   TCO2 28 12/02/2018 1829   ACIDBASEDEF 2.0 12/02/2018 1829   O2SAT 95.0 12/02/2018 1829   CBG (last 3)  Recent Labs    12/07/18 1159  GLUCAP 99    Assessment/Plan: S/P Procedure(s) (LRB): AORTIC VALVE REPLACEMENT (AVR) USING  CARBOMEDICS SUPRA-ANNULAR TOP HAT SIZE 21MM (N/A) MITRAL VALVE (MV) REPLACEMENT USING CARBOMEDICS OPTIFORM SIZE 31MM (N/A) TRANSESOPHAGEAL ECHOCARDIOGRAM (TEE) (N/A)  -POD8 AVR/MVR with mechanical prosthetic valves.  On Coumadin, INR 2.2. Continue to encourage activity.   -ESRD on HD-mgt per nephrology  -HTN-controlled on home medication regimine.  -Expected acute blood loss anemia, relatively stable with no indication for transfusion now. On Darbepoetin per nephrology.  -Post-op PTX-resolved.  CXR 7/21 shows both lungs well expanded and small right pleural effusion.  -Continue working on mobility and O2 wean today. Request walk test to see if she qualifies for home O2 therapy.  Possible discharge later today or tomorrow am.   LOS: 8 days    Antony Odea, PA-C [phone] 12/10/2018   I have seen and examined the patient and agree with the assessment and plan as outlined.  Patient looks good clinically, although she still has significant O2 requirement likely related to a combination of atelectasis and residual volume excess w/ acute exacerbation of chronic CHF.  Rexene Alberts, MD 12/10/2018

## 2018-12-10 NOTE — Progress Notes (Addendum)
[facility] Progress Note   Subjective:   Patient seen and examined at bedside in dialysis.  States she is feeling ok. No new complaints.  Tolerating dialysis well. Likely to d/c later today or tomorrow morning.    Objective Vitals:   12/10/18 0800 12/10/18 0830 12/10/18 0900 12/10/18 0930  BP: (!) 151/86 (!) 146/82 (!) 142/76 134/68  Pulse: 100 99 78 82  Resp: 19     Temp:      TempSrc:      SpO2:      Weight:      Height:       Physical Exam General:NAD, pleasant female, sitting up in bed in dialysis Heart:RRR, +9/1 systolic murmur Lungs:mostly CTAB Extremities:Trace LE edema on R, no edema on L Dialysis Access: RU AVF cannulated   Filed Weights   12/09/18 0413 12/10/18 0602 12/10/18 0745  Weight: 65.6 kg 65.6 kg 65.6 kg    Intake/Output Summary (Last 24 hours) at 12/10/2018 1022 Last data filed at 12/09/2018 2338 Gross per 24 hour  Intake 0 ml  Output -  Net 0 ml    Additional Objective Labs: Basic Metabolic Panel: Recent Labs  Lab 12/08/18 0317 12/09/18 0256 12/10/18 0252  NA 127* 134* 133*  K 6.0* 4.2 4.4  CL 88* 95* 93*  CO2 19* 25 24  GLUCOSE 65* 93 93  BUN 79* 32* 49*  CREATININE 8.65* 4.74* 6.95*  CALCIUM 8.9 8.8* 9.3  PHOS 9.3* 5.4* 7.3*   Liver Function Tests: Recent Labs  Lab 12/08/18 0317 12/09/18 0256 12/10/18 0252  ALBUMIN 2.7* 2.6* 2.6*   CBC: Recent Labs  Lab 12/05/18 0808 12/06/18 0601 12/07/18 0844 12/08/18 1541 12/10/18 1009  WBC 11.2* 8.9 7.1 7.4 6.9  HGB 8.2* 8.3* 7.8* 7.6* 7.3*  HCT 26.1* 26.3* 25.0* 23.2* 23.4*  MCV 102.4* 102.7* 103.7* 100.9* 105.4*  PLT 145* 121* 153 194 222   Lab Results  Component Value Date   INR 2.2 (H) 12/10/2018   INR 2.1 (H) 12/09/2018   INR 2.1 (H) 12/08/2018   Studies/Results: Dg Chest 2 View  Result Date: 12/09/2018 CLINICAL DATA:  Postop open heart surgery.  Pain. EXAM: CHEST - 2 VIEW COMPARISON:  12/08/2018 FINDINGS: Prior median sternotomy and valve repair.  Bibasilar opacities, likely atelectasis. Possible small effusions. No pneumothorax. IMPRESSION: Continued bibasilar opacities, slightly improved, likely atelectasis. Suspect small effusions. Electronically Signed   By: Rolm Baptise M.D.   On: 12/09/2018 10:13   Dg Chest Port 1 View  Result Date: 12/08/2018 CLINICAL DATA:  Status post right chest tube removal today. EXAM: PORTABLE CHEST 1 VIEW COMPARISON:  PA and lateral chest 12/08/2018. FINDINGS: Pigtail catheter in the right chest is been removed. No pneumothorax. Small bilateral pleural effusions and basilar airspace disease are unchanged. The patient is status post aortic and mitral valve repair. IMPRESSION: Negative for pneumothorax with a right chest tube removal. No change in small bilateral pleural effusions and basilar atelectasis. Electronically Signed   By: Inge Rise M.D.   On: 12/08/2018 11:39    Medications: . sodium chloride    . sodium chloride    . sodium chloride     . amLODipine  10 mg Oral Daily  . bisacodyl  10 mg Oral Daily   Or  . bisacodyl  10 mg Rectal Daily  . calcitRIOL  3 mcg Oral Q M,W,F-HD  . Chlorhexidine Gluconate Cloth  6 each Topical Q0600  . cloNIDine  0.3 mg Transdermal Weekly  . [START  ON 12/12/2018] darbepoetin (ARANESP) injection - DIALYSIS  200 mcg Intravenous Q Fri-HD  . docusate sodium  200 mg Oral Daily  . labetalol  300 mg Oral BID  . lanthanum  2,000 mg Oral TID WC  . mouth rinse  15 mL Mouth Rinse BID  . multivitamin  1 tablet Oral QHS  . pantoprazole  40 mg Oral BID  . patiromer  8.4 g Oral Daily  . predniSONE  5 mg Oral Q breakfast  . sodium chloride flush  3 mL Intravenous Q12H  . warfarin  2.5 mg Oral q1800  . Warfarin - Physician Dosing Inpatient   Does not apply q1800    Dialysis Orders: [place] 3.5h   450/800   61.5kg   2K/2Ca   RUA AVF   Hep 5000 Mircera25mcg q2wks - last on 7/3 Calcitriol25mcg PO qHD  Assessment/Plan: 1. Severe MS/moderate AS- s/p MVR &AVR by  Dr. Roxy Manns 7/14. Per CTS to d/c home today or tomorrow.  Coumadin clinic to manage anticoagulation.  2. ESRD-HD MWF.  HD now per regular schedule.  Plan for HD at OP unit Friday.  3. Hypertension/Volume - BP at goal on meds.  Should meet [name] post HD today.   4. Anemia of CKD- Hgb decreased to 7.3. Aranesp^237mcg qFri.Follow trend and transfuse as indicated.  5. Secondary Hyperparathyroidism - Calcium at goal.  Phos variable, 5.4 yesterday, 7.3 today. Cont calcitriol and fosrenol 2000with meals 6. Nutrition- Renal/potassium restricted diet, Vit 7. Pulmonary HTN 8.  Hyperkalemia- follow, use K binders as needed - diet changed to potassium restrict-   Jen Mow, PA-C [facility] Pager: [phone] 12/10/2018,10:22 AM  LOS: 8 days   Pt seen, examined and agree w A/P as above.  [name]  MD 12/10/2018, 11:56 AM

## 2018-12-10 NOTE — Progress Notes (Signed)
1415 Came to see pt an hour ago and she was eating. Requested to come back in an hour. Came back and pt stated she just got pain med and was getting hot packs and resting until five o'clock. Then she is going to walk with her RN.  RN will check to see if pt needs oxygen. Pt stated not going home today. We will follow up tomorrow. Graylon Good RN BSN 12/10/2018 2:13 PM

## 2018-12-10 NOTE — Progress Notes (Signed)
SATURATION QUALIFICATIONS: (This note is used to comply with regulatory documentation for home oxygen)  Patient Saturations on Room Air at Rest = 78-80%  Patient Saturations on Room Air while Ambulating = 70-75%  Patient Saturations on 3 Liters of oxygen while Ambulating = 97-100%  Please briefly explain why patient needs home oxygen: Ambulated to the nurses station and back requiring 2 breaks.Desats at rest, and while ambulating into the 70's. Recovers on 3L of O2.

## 2018-12-11 LAB — RENAL FUNCTION PANEL
Albumin: 2.6 g/dL — ABNORMAL LOW (ref 3.5–5.0)
Anion gap: 14 (ref 5–15)
BUN: 26 mg/dL — ABNORMAL HIGH (ref 6–20)
CO2: 27 mmol/L (ref 22–32)
Calcium: 9.5 mg/dL (ref 8.9–10.3)
Chloride: 94 mmol/L — ABNORMAL LOW (ref 98–111)
Creatinine, Ser: 4.82 mg/dL — ABNORMAL HIGH (ref 0.44–1.00)
GFR calc Af Amer: 11 mL/min — ABNORMAL LOW (ref >60)
GFR calc non Af Amer: 9 mL/min — ABNORMAL LOW (ref >60)
Glucose, Bld: 106 mg/dL — ABNORMAL HIGH (ref 70–99)
Phosphorus: 5.1 mg/dL — ABNORMAL HIGH (ref 2.5–4.6)
Potassium: 3.8 mmol/L (ref 3.5–5.1)
Sodium: 135 mmol/L (ref 135–145)

## 2018-12-11 LAB — PROTIME-INR
INR: 1.8 — ABNORMAL HIGH (ref 0.8–1.2)
Prothrombin Time: 20.7 seconds — ABNORMAL HIGH (ref 11.4–15.2)

## 2018-12-11 MED ORDER — WARFARIN SODIUM 3 MG PO TABS: 3.0000 mg | ORAL_TABLET | Freq: Every day | ORAL | 11 refills | Status: DC

## 2018-12-11 MED ORDER — TRAMADOL HCL 50 MG PO TABS: 50.0000 mg | ORAL_TABLET | ORAL | 0 refills | Status: DC | PRN

## 2018-12-11 NOTE — Progress Notes (Signed)
CARDIAC REHAB PHASE I   D/c education completed with pt. Pt educated on importance of showers and monitoring incisions daily. Encouraged continued IS use, walks as able, and sternal precautions. Pt given in-the-tube sheet. Deferred diet to dialysis. Reviewed restrictions and ambulation with emphasis on safety. Not able to participate in CRP II at this time due to HD schedule.  [phone] Rufina Falco, RN BSN 12/11/2018 10:32 AM

## 2018-12-11 NOTE — TOC Transition Note (Signed)
Transition of Care [facility]) - CM/SW Discharge Note Marvetta Gibbons RN, BSN Transitions of Care Unit 4E- RN Case Manager [phone]   Patient Details  Name: [name] MRN: [id number] Date of Birth: 10-09-1960  Transition of Care [facility]) CM/SW Contact:  Dawayne Patricia, RN Phone Number: 12/11/2018, 12:03 PM   Clinical Narrative:    Pt s/p MVR/AVR, stable for transition home today, order placed for home 02 and RW. Notified Zach with [facility] for DME needs- RW and portable 02 tank to be delivered to room prior to discharge. .    Final next level of care: Home/Self Care Barriers to Discharge: No Barriers Identified   Patient Goals and CMS Choice Patient states their goals for this hospitalization and ongoing recovery are:: home CMS Medicare.gov Compare Post Acute Care list provided to:: Patient Choice offered to / list presented to : Patient  Discharge Placement  home                     Discharge Plan and Services   Discharge Planning Services: CM Consult Post Acute Care Choice: Durable Medical Equipment          DME Arranged: Walker rolling, Oxygen DME Agency: [employer] Date DME Agency Contacted: 12/11/18 Time DME Agency Contacted: 93 Representative spoke with at DME Agency: Thedore Mins HH Arranged: NA [place] Agency: NA        Social Determinants of Health ([place]) Interventions     Readmission Risk Interventions Readmission Risk Prevention Plan 12/11/2018  Transportation Screening Complete  PCP or Specialist Appt within 3-5 Days Complete  HRI or [place] Complete  Social Work Consult for [place] Planning/Counseling Complete  Palliative Care Screening Not Applicable  Medication Review [employer]) Complete  Some recent data might be hidden

## 2018-12-11 NOTE — Progress Notes (Signed)
[facility] Progress Note   Subjective:   No c/o, going home today  Objective Vitals:   12/10/18 1958 12/11/18 0103 12/11/18 0456 12/11/18 0814  BP: 125/70 (!) 152/80 (!) 142/82 (!) 145/82  Pulse: (!) 101 99 97 98  Resp: (!) 21 16 (!) 26 18  Temp: 98.5 F (36.9 C) 98.1 F (36.7 C) 98.1 F (36.7 C) 97.9 F (36.6 C)  TempSrc: Oral Oral Oral Oral  SpO2: 100% 100% 100% 100%  Weight:   61.5 kg   Height:       Physical Exam General:NAD, pleasant female, sitting up in bed in dialysis Heart:RRR, +4/1 systolic murmur Lungs:mostly CTAB Extremities:Trace LE edema on R, no edema on L Dialysis Access: RU AVF cannulated   Filed Weights   12/10/18 0745 12/10/18 1119 12/11/18 0456  Weight: 65.6 kg 63.6 kg 61.5 kg    Intake/Output Summary (Last 24 hours) at 12/11/2018 1216 Last data filed at 12/11/2018 0900 Gross per 24 hour  Intake 400 ml  Output -  Net 400 ml    Additional Objective Labs: Basic Metabolic Panel: Recent Labs  Lab 12/09/18 0256 12/10/18 0252 12/11/18 0254  NA 134* 133* 135  K 4.2 4.4 3.8  CL 95* 93* 94*  CO2 25 24 27   GLUCOSE 93 93 106*  BUN 32* 49* 26*  CREATININE 4.74* 6.95* 4.82*  CALCIUM 8.8* 9.3 9.5  PHOS 5.4* 7.3* 5.1*   Liver Function Tests: Recent Labs  Lab 12/09/18 0256 12/10/18 0252 12/11/18 0254  ALBUMIN 2.6* 2.6* 2.6*   CBC: Recent Labs  Lab 12/05/18 0808 12/06/18 0601 12/07/18 0844 12/08/18 1541 12/10/18 1009  WBC 11.2* 8.9 7.1 7.4 6.9  HGB 8.2* 8.3* 7.8* 7.6* 7.3*  HCT 26.1* 26.3* 25.0* 23.2* 23.4*  MCV 102.4* 102.7* 103.7* 100.9* 105.4*  PLT 145* 121* 153 194 222   Lab Results  Component Value Date   INR 1.8 (H) 12/11/2018   INR 2.2 (H) 12/10/2018   INR 2.1 (H) 12/09/2018   Studies/Results: No results found.  Medications: . sodium chloride    . sodium chloride    . sodium chloride     . amLODipine  10 mg Oral Daily  . bisacodyl  10 mg Oral Daily   Or  . bisacodyl  10 mg Rectal Daily  .  calcitRIOL  3 mcg Oral Q M,W,F-HD  . Chlorhexidine Gluconate Cloth  6 each Topical Q0600  . cloNIDine  0.3 mg Transdermal Weekly  . [START ON 12/12/2018] darbepoetin (ARANESP) injection - DIALYSIS  200 mcg Intravenous Q Fri-HD  . docusate sodium  200 mg Oral Daily  . labetalol  300 mg Oral BID  . lanthanum  2,000 mg Oral TID WC  . mouth rinse  15 mL Mouth Rinse BID  . multivitamin  1 tablet Oral QHS  . pantoprazole  40 mg Oral BID  . patiromer  8.4 g Oral Daily  . predniSONE  5 mg Oral Q breakfast  . sodium chloride flush  3 mL Intravenous Q12H  . warfarin  2.5 mg Oral q1800  . Warfarin - Physician Dosing Inpatient   Does not apply q1800    Dialysis Orders: [facility] 3.5h   450/800   61.5kg   2K/2Ca   RUA AVF   Hep 5000 Mircera218mcg q2wks - last on 7/3 Calcitriol13mcg PO qHD  Assessment/Plan: 1. Severe MS/moderate AS- s/p MVR &AVR by Dr. Roxy Manns 7/14. Per CTS to d/c home today.  Coumadin clinic to manage anticoagulation.  2. ESRD-HD  MWF 3. Hypertension/Volume - BP at goal, at dry wt exactly  4. Anemia of CKD- Hgb decreased to 7.3. Aranesp^280mcg q Fri 5. Secondary Hyperparathyroidism - Calcium at goal.  Phos variable, 5.4 yesterday, 7.3 today. Cont calcitriol and fosrenol 2000with meals 6. Nutrition- Renal/potassium restricted diet, Vit 7. Pulmonary HTN 8. Dispo - going home today   [name]  MD 12/11/2018, 12:16 PM

## 2018-12-11 NOTE — Progress Notes (Signed)
9 Days Post-Op Procedure(s) (LRB): AORTIC VALVE REPLACEMENT (AVR) USING CARBOMEDICS SUPRA-ANNULAR TOP HAT SIZE 21MM (N/A) MITRAL VALVE (MV) REPLACEMENT USING CARBOMEDICS OPTIFORM SIZE 31MM (N/A) TRANSESOPHAGEAL ECHOCARDIOGRAM (TEE) (N/A) Subjective: Says she didn't rest well last night. Having some nausea that she thinks in related to the pain medicine. " I don't want any more of that stuff". She denies abdominal pain, she has had BM's x 3 since surgery.   Objective: Vital signs in last 24 hours: Temp:  [97.9 F (36.6 C)-99.4 F (37.4 C)] 97.9 F (36.6 C) (07/23 0814) Pulse Rate:  [68-102] 98 (07/23 0814) Cardiac Rhythm: Heart block (07/23 0746) Resp:  [16-26] 18 (07/23 0814) BP: (125-152)/(59-98) 145/82 (07/23 0814) SpO2:  [96 %-100 %] 100 % (07/23 0814) Weight:  [61.5 kg-63.6 kg] 61.5 kg (07/23 0456)     Intake/Output from previous day: 07/22 0701 - 07/23 0700 In: 350 [P.O.:350] Out: 2082  Intake/Output this shift: No intake/output data recorded.  Physical Exam General appearance:alert, cooperative and no distress Neurologic:intact Heart:regular rate and rhythm Lungs:clear to auscultation bilaterally ABD: soft and not tender. Extremities:No LE edema, all well perfused. Wound:The sternal incision and CT sites are dry and intact.   Lab Results: Recent Labs    12/08/18 1541 12/10/18 1009  WBC 7.4 6.9  HGB 7.6* 7.3*  HCT 23.2* 23.4*  PLT 194 222   BMET:  Recent Labs    12/10/18 0252 12/11/18 0254  NA 133* 135  K 4.4 3.8  CL 93* 94*  CO2 24 27  GLUCOSE 93 106*  BUN 49* 26*  CREATININE 6.95* 4.82*  CALCIUM 9.3 9.5    PT/INR:  Recent Labs    12/11/18 0254  LABPROT 20.7*  INR 1.8*   ABG    Component Value Date/Time   PHART 7.264 (L) 12/02/2018 1829   HCO3 25.8 12/02/2018 1829   TCO2 28 12/02/2018 1829   ACIDBASEDEF 2.0 12/02/2018 1829   O2SAT 95.0 12/02/2018 1829   CBG (last 3)  No results for input(s): GLUCAP in the last 72  hours.  Assessment/Plan: S/P Procedure(s) (LRB): AORTIC VALVE REPLACEMENT (AVR) USING CARBOMEDICS SUPRA-ANNULAR TOP HAT SIZE 21MM (N/A) MITRAL VALVE (MV) REPLACEMENT USING CARBOMEDICS OPTIFORM SIZE 31MM (N/A) TRANSESOPHAGEAL ECHOCARDIOGRAM (TEE) (N/A)  -POD9 AVR/MVR with mechanical prosthetic valves. On Coumadin 2.5mg  daily , INR 1.8. Will discharge on coumadin 3mg  po daily.  -Nausea-- abdominal exam benign. She believes Sx related to Percocet. Will discharge on tramadol prn for pain.  -ESRD on HD-mgt per nephrology  -HTN-controlled on home medication regimine.  -Expected acute blood loss anemia, relatively stable with no indication for transfusion. On Darbepoetin per nephrology.  -Post-op PTX-resolved.  CXR 7/21 shows both lungs well expanded and small right pleural effusion.  -Discharge home today. Instructions given.Arranging follow up with the Coumadin Clinic.   LOS: 9 days    Antony Odea, [place] [phone] 12/11/2018

## 2018-12-12 ENCOUNTER — Telehealth: Payer: Self-pay

## 2018-12-12 DIAGNOSIS — N186 End stage renal disease: Secondary | ICD-10-CM | POA: Diagnosis not present

## 2018-12-12 DIAGNOSIS — M3210 Systemic lupus erythematosus, organ or system involvement unspecified: Secondary | ICD-10-CM | POA: Diagnosis not present

## 2018-12-12 DIAGNOSIS — D509 Iron deficiency anemia, unspecified: Secondary | ICD-10-CM | POA: Diagnosis not present

## 2018-12-12 DIAGNOSIS — D631 Anemia in chronic kidney disease: Secondary | ICD-10-CM | POA: Diagnosis not present

## 2018-12-12 DIAGNOSIS — N2581 Secondary hyperparathyroidism of renal origin: Secondary | ICD-10-CM | POA: Diagnosis not present

## 2018-12-12 NOTE — Telephone Encounter (Signed)
Spoke with pt regarding covid-19 screening prior to appt. Pt stated she has not been in contact with anyone who may have covid-19 and has no symptoms.

## 2018-12-15 ENCOUNTER — Telehealth: Payer: Self-pay | Admitting: Physician Assistant

## 2018-12-15 DIAGNOSIS — N186 End stage renal disease: Secondary | ICD-10-CM | POA: Diagnosis not present

## 2018-12-15 DIAGNOSIS — N2581 Secondary hyperparathyroidism of renal origin: Secondary | ICD-10-CM | POA: Diagnosis not present

## 2018-12-15 DIAGNOSIS — M3210 Systemic lupus erythematosus, organ or system involvement unspecified: Secondary | ICD-10-CM | POA: Diagnosis not present

## 2018-12-15 DIAGNOSIS — D631 Anemia in chronic kidney disease: Secondary | ICD-10-CM | POA: Diagnosis not present

## 2018-12-15 DIAGNOSIS — D509 Iron deficiency anemia, unspecified: Secondary | ICD-10-CM | POA: Diagnosis not present

## 2018-12-15 NOTE — Telephone Encounter (Signed)

## 2018-12-16 ENCOUNTER — Ambulatory Visit: Payer: Medicare Other | Admitting: Physician Assistant

## 2018-12-16 ENCOUNTER — Ambulatory Visit (INDEPENDENT_AMBULATORY_CARE_PROVIDER_SITE_OTHER): Payer: Medicare Other | Admitting: Physician Assistant

## 2018-12-16 ENCOUNTER — Other Ambulatory Visit: Payer: Self-pay

## 2018-12-16 ENCOUNTER — Ambulatory Visit (INDEPENDENT_AMBULATORY_CARE_PROVIDER_SITE_OTHER): Payer: Medicare Other | Admitting: Pharmacist

## 2018-12-16 ENCOUNTER — Encounter: Payer: Self-pay | Admitting: Physician Assistant

## 2018-12-16 VITALS — BP 122/60 | HR 81 | Ht 66.0 in | Wt 147.8 lb

## 2018-12-16 DIAGNOSIS — Z954 Presence of other heart-valve replacement: Secondary | ICD-10-CM

## 2018-12-16 DIAGNOSIS — I5032 Chronic diastolic (congestive) heart failure: Secondary | ICD-10-CM

## 2018-12-16 DIAGNOSIS — I110 Hypertensive heart disease with heart failure: Secondary | ICD-10-CM | POA: Diagnosis not present

## 2018-12-16 DIAGNOSIS — I5022 Chronic systolic (congestive) heart failure: Secondary | ICD-10-CM | POA: Diagnosis not present

## 2018-12-16 DIAGNOSIS — Z5181 Encounter for therapeutic drug level monitoring: Secondary | ICD-10-CM | POA: Diagnosis not present

## 2018-12-16 LAB — POCT INR: INR: 1.5 — AB (ref 2.0–3.0)

## 2018-12-16 NOTE — Patient Instructions (Signed)
Description   Take 2 tablets today, then start taking 1 tablet daily except 2 tablets on Mondays, Wednesdays, and Fridays. Recheck INR in 1 week. Call Coumadin clinic with any problems [phone].

## 2018-12-16 NOTE — Patient Instructions (Signed)
Medication Instructions:  Your physician recommends that you continue on your current medications as directed. Please refer to the Current Medication list given to you today.  If you need a refill on your cardiac medications before your next appointment, please call your pharmacy.   Lab work: None ordered  If you have labs (blood work) drawn today and your tests are completely normal, you will receive your results only by: [name] MyChart Message (if you have MyChart) OR . A paper copy in the mail If you have any lab test that is abnormal or we need to change your treatment, we will call you to review the results.  Testing/Procedures: None ordered  Follow-Up: At [facility], you and your health needs are our priority.  As part of our continuing mission to provide you with exceptional heart care, we have created designated Provider Care Teams.  These Care Teams include your primary Cardiologist (physician) and Advanced Practice Providers (APPs -  Physician Assistants and Nurse Practitioners) who all work together to provide you with the care you need, when you need it. You will need a follow up appointment in:  3 months.  Please call our office 2 months in advance to schedule this appointment.  You may see Dorris Carnes, MD or one of the following Advanced Practice Providers on your designated Care Team: Richardson Dopp, PA-C [place], [place] . Daune Perch, NP  Any Other Special Instructions Will Be Listed Below (If Applicable).

## 2018-12-16 NOTE — Progress Notes (Signed)
Cardiology Office Note    Date:  12/16/2018   ID:  [name], DOB [birth year], MRN [id number]  PCP:  Benito Mccreedy, MD  Cardiologist: Dr. Harrington Challenger  Chief Complaint: Hospital follow up   History of Present Illness:   [name] is a 58 y.o. female with history of CVA, end-stage renal disease on hemodialysis, lupus, hypertension, peptic ulcer disease, valvular heart disease with aortic stenosis and mitral stenosis s/p recent surgery came for hospital follow-up.  She has a longstanding history of valvular heart disease.  Last echocardiogram February 2020 showed LV function of 60 to 65%, pseudo-normal diastolic heart failure, severe mitral stenosis with mild to moderate mitral regurgitation with mean gradient across mitral valve of 21 mmHg.  Moderate stenosis of aortic valve with mean gradient 34 mmHg.  Cardiac catheterization 07/24/2018 for further evaluation showed no significant CAD, preserved cardiac output, severe mitral stenosis and moderate aortic stenosis.  Patient was seen by surgeon and he is valve replacement delayed due to ongoing COVID-19 pandemic.  Eventually patient had aortic and mitral vales replaced with Carbomedics mechanical prosthetic valves.  No complication.  Started on Coumadin for anticoagulation.  She was hypoxic with ambulation.  Which improved with supplemental oxygen and discharged on that.  Chest x-ray with mild bibasilar atelectasis and minimal pleural effusion.  Here today for follow-up.  Has noted lower extremity edema since surgery.  She was unable to complete dialysis session yesterday because of chest discomfort from surgery.  Sleeps on surgical bed.  Denies palpitation, dizziness, syncope or melena.  Compliant with medication and low-sodium diet.  Past Medical History:  Diagnosis Date  . Anemia   . Aortic stenosis 09/25/2016   Echo 07/24/16: Mod conc LVH, EF 60-65, no RWMA, Gr 2 DDd, bicuspid aortic valve, mild to mod AS (mean 18, peak 38), MAC, mod mitral  stenosis (mean 9, peak 19), mild to mod MR, severe LAE, normal RVSF, mild RAE, mild TR  . Arthritis    "joints" (10/23/2017)  . Blood transfusion '08   [facility]; "low HgB" (10/23/2017)  . Chronic diastolic (congestive) heart failure ([place])   . Chronic diastolic CHF (congestive heart failure) ([place])   . Claustrophobia   . Dysfunctional uterine bleeding 12/19/2010  . ESRD (end stage renal disease) on dialysis [facility])    "MWF; [name]." (10/23/2017)  . GERD (gastroesophageal reflux disease)   . Headache   . Heart murmur   . Hemodialysis patient [facility])    right extremity port  . History of hiatal hernia   . Hx of cardiovascular stress test    Lexiscan Myoview 4/16:  Normal stress nuclear study, EF 59%  . Hypertension   . Lupus ([place])    "? kind" (10/23/2017)  . Mitral stenosis    Echo 4/16:  EF 55-60%, no RWMA, Gr 1 DD, mod MS (mean 9 mmHg), mod LAE, mild RAE, PASP 65, mod to severe TR, trivial eff // Echo 6/19:  Mild LVH, EF 55-60, no RWMA, Gr 2 DD, mild to mod AS (Mean 20), severe MS (mean 17), massive LAE, PASP 48, trivial effusion   . Peptic ulcer disease   . Pneumonia 10/21/2017  . PONV (postoperative nausea and vomiting)   . S/P aortic valve replacement with metallic valve 9/89/2119   21 mm Sorin Carbomedics bileaflet mechanical valve  . S/P mitral valve replacement with metallic valve 09/05/4079   31 mm Sorin Carbomedics Optiform bileaflet mechanical valve  . Stroke [facility])    per patient "they said i had  a small stroke but i couldnt even tell"    Past Surgical History:  Procedure Laterality Date  . AORTIC VALVE REPLACEMENT  12/02/2018   AORTIC VALVE REPLACEMENT (AVR) USING CARBOMEDICS SUPRA-ANNULAR TOP HAT SIZE 21MM (N/A)  . AORTIC VALVE REPLACEMENT N/A 12/02/2018   Procedure: AORTIC VALVE REPLACEMENT (AVR) USING CARBOMEDICS SUPRA-ANNULAR TOP HAT SIZE 21MM;  Surgeon: Rexene Alberts, MD;  Location: [place];  Service: Open Heart Surgery;  Laterality: N/A;  . AV FISTULA PLACEMENT Right 02/25/2018    Procedure: INSERTION OF 67m x 16cm ARTEGRAFT;  Surgeon: CWaynetta Sandy MD;  [place]  Service: Vascular;  Laterality: Right;  . COLONOSCOPY W/ BIOPSIES AND POLYPECTOMY    . DIALYSIS FISTULA CREATION  2007  . ENDOMETRIAL ABLATION    . ESOPHAGOGASTRODUODENOSCOPY N/A 07/31/2014   Procedure: ESOPHAGOGASTRODUODENOSCOPY (EGD);  Surgeon: MClarene Essex MD;  [facility]  Service: Endoscopy;  Laterality: N/A;  . FISTULOGRAM Right 02/25/2018   Procedure: FISTULOGRAM ARM;  Surgeon: CWaynetta Sandy MD;  [place]  Service: Vascular;  Laterality: Right;  . HEMATOMA EVACUATION Right 03/06/2018   Procedure: EVACUATION HEMATOMA RIGHT UPPER ARM;  Surgeon: CWaynetta Sandy MD;  [place]  Service: Vascular;  Laterality: Right;  . INSERTION OF ARTERIOVENOUS (AV) ARTEGRAFT ARM Right 09/26/2017   Procedure: INSERTION OF ARTERIOVENOUS (AV) ARTEGRAFT INTO RIGHT ARM;  Surgeon: CWaynetta Sandy MD;  [place]  Service: Vascular;  Laterality: Right;  . INSERTION OF DIALYSIS CATHETER Right 03/06/2018   Procedure: INSERTION OF DIALYSIS CATHETER, right internal jugular;  Surgeon: CWaynetta Sandy MD;  [place]  Service: Vascular;  Laterality: Right;  . MITRAL VALVE REPLACEMENT N/A 12/02/2018   Procedure: MITRAL VALVE (MV) REPLACEMENT USING CARBOMEDICS OPTIFORM SIZE 31MM;  Surgeon: ORexene Alberts MD;  [place]  Service: Open Heart Surgery;  Laterality: N/A;  . REVISON OF ARTERIOVENOUS FISTULA Right 09/26/2017   Procedure: REVISION OF ARTERIOVENOUS FISTULA RIGHT ARM WITH ARTEGRAFT;  Surgeon: CWaynetta Sandy MD;  [place]  Service: Vascular;  Laterality: Right;  . REVISON OF ARTERIOVENOUS FISTULA Right 02/25/2018   Procedure: REVISION OF ARTERIOVENOUS FISTULA;  Surgeon: CWaynetta Sandy MD;  [place]  Service: Vascular;  Laterality: Right;  . RIGHT/LEFT HEART CATH AND CORONARY ANGIOGRAPHY  N/A 07/24/2018   Procedure: RIGHT/LEFT HEART CATH AND CORONARY ANGIOGRAPHY;  Surgeon: MLarey Dresser MD;  [place];  Service: Cardiovascular;  Laterality: N/A;  . SHOULDER OPEN ROTATOR CUFF REPAIR Right 10/09/2016   Procedure: ROTATOR CUFF REPAIR SHOULDER OPEN partial acrominectomy and extensive synovectomy;  Surgeon: GLatanya [name] MD;  [facility];  Service: Orthopedics;  Laterality: Right;  RNFA  . TEE WITHOUT CARDIOVERSION N/A 01/02/2018   Procedure: TRANSESOPHAGEAL ECHOCARDIOGRAM (TEE) Bubble Study;  Surgeon: MLarey Dresser MD;  [facility];  Service: Cardiovascular;  Laterality: N/A;  . TEE WITHOUT CARDIOVERSION N/A 12/02/2018   Procedure: TRANSESOPHAGEAL ECHOCARDIOGRAM (TEE);  Surgeon: ORexene Alberts MD;  [place]  Service: Open Heart Surgery;  Laterality: N/A;  . TUBAL LIGATION      Current Medications: Prior to Admission medications   Medication Sig Start Date End Date Taking? Authorizing Provider  amLODipine (NORVASC) 10 MG tablet Take 1 tablet (10 mg total) by mouth at bedtime. 03/15/18   ERoxan Hockey MD  aspirin EC 81 MG tablet Take 81 mg by mouth at bedtime.    [provider]  calcitRIOL (ROCALTROL) 0.25 MCG capsule  Take 7 capsules (1.75 mcg total) by mouth every other day. Patient taking differently: Take 1.75 mcg by mouth every Monday, Wednesday, and Friday. At hemodyalisis 10/27/17   Phillips Grout, MD  cloNIDine (CATAPRES) 0.2 MG tablet Take 1 tablet (0.2 mg) by mouth twice daily - on dialysis days take after dialysis and at bedtime, on other days take one in the morning and one at night 09/04/18   Fay Records, MD  ferric citrate (AURYXIA) 1 GM 210 MG(Fe) tablet Take 840 mg by mouth 3 (three) times daily with meals.     [provider]  labetalol (NORMODYNE) 300 MG tablet Take 1 tablet (300 mg total) by mouth 2 (two) times daily. 08/29/18   Fay Records, MD  lanthanum (FOSRENOL) 1000 MG chewable tablet  Chew 2 tablets (2,000 mg total) by mouth 3 (three) times daily with meals. Patient not taking: Reported on 11/19/2018 05/05/15   Eugenie Filler, MD  multivitamin (RENA-VIT) TABS tablet Take 1 tablet by mouth at bedtime.     [provider]  ondansetron (ZOFRAN) 4 MG tablet Take 4 mg by mouth every 6 (six) hours as needed for nausea or vomiting.    [provider]  pantoprazole (PROTONIX) 40 MG tablet Take 40 mg by mouth 2 (two) times daily.     [provider]  predniSONE (DELTASONE) 10 MG tablet Take 10 mg by mouth daily with breakfast.    [provider]  traMADol (ULTRAM) 50 MG tablet Take 1 tablet (50 mg total) by mouth every 4 (four) hours as needed for moderate pain. 12/11/18   Antony Odea, PA-C  warfarin (COUMADIN) 3 MG tablet Take 1 tablet (3 mg total) by mouth daily. 12/11/18 12/11/19  Antony Odea, PA-C    Allergies:   Patient has no known allergies.   Social History   Socioeconomic History  . Marital status: Single    Spouse name: Not on file  . Number of children: Not on file  . Years of education: Not on file  . Highest education level: Not on file  Occupational History  . Not on file  Social Needs  . Financial resource strain: Not on file  . Food insecurity    Worry: Not on file    Inability: Not on file  . Transportation needs    Medical: Not on file    Non-medical: Not on file  Tobacco Use  . Smoking status: Former Smoker    Packs/day: 0.10    Years: 10.00    Pack years: 1.00    Types: Cigarettes    Start date: 10/21/2017    Quit date: 11/20/2018    Years since quitting: 0.0  . Smokeless tobacco: Never Used  Substance and Sexual Activity  . Alcohol use: Yes    Alcohol/week: 1.0 standard drinks    Types: 1 Cans of beer per week    Comment: occasional beer  . Drug use: Yes    Types: Marijuana, Cocaine    Comment: marijuana 11/27/2018  . Sexual activity: Not Currently  Lifestyle  . Physical activity    Days  per week: Not on file    Minutes per session: Not on file  . Stress: Not on file  Relationships  . Social Herbalist on phone: Not on file    Gets together: Not on file    Attends religious service: Not on file    Active member of club or organization: Not on  file    Attends meetings of clubs or organizations: Not on file    Relationship status: Not on file  Other Topics Concern  . Not on file  Social History Narrative  . Not on file     Family History:  The patient's family history includes Hypertension in her father and mother; Liver cancer in her maternal grandmother; Lymphoma in her maternal aunt; Renal Disease in her father.  ROS:   Please see the history of present illness.    ROS All other systems reviewed and are negative.   PHYSICAL EXAM:   VS:  BP 122/60   Pulse 81   Ht _0  (1.676 m)   Wt 147 lb 12.8 oz (67 kg)   LMP 12/05/2010 (LMP Unknown)   BMI 23.86 kg/m    GEN: Well nourished, well developed, in no acute distress  HEENT: normal  Neck: no JVD, carotid bruits, or masses Cardiac: RRR; no murmurs, rubs, or gallops, 1+ bilateral lower extremity edema edema  Respiratory:  clear to auscultation bilaterally, normal work of breathing GI: soft, nontender, nondistended, + BS MS: no deformity or atrophy  Skin: warm and dry, no rash Neuro:  Alert and Oriented x 3, Strength and sensation are intact Psych: euthymic mood, full affect  Wt Readings from Last 3 Encounters:  12/16/18 147 lb 12.8 oz (67 kg)  12/11/18 135 lb 9.3 oz (61.5 kg)  12/01/18 139 lb (63 kg)      Studies/Labs Reviewed:   EKG:  EKG is ordered today.  The ekg ordered today demonstrates sinus rhythm with prolonged PR interval of 294 ms  Recent Labs: 11/27/2018: ALT 17 12/03/2018: Magnesium 1.9 12/10/2018: Hemoglobin 7.3; Platelets 222 12/11/2018: BUN 26; Creatinine, Ser 4.82; Potassium 3.8; Sodium 135   Lipid Panel    Component Value Date/Time   CHOL 187 02/23/2016 1040   TRIG 89  02/23/2016 1040   HDL 55 02/23/2016 1040   CHOLHDL 3.4 02/23/2016 1040   VLDL 18 02/23/2016 1040   LDLCALC 114 02/23/2016 1040    Additional studies/ records that were reviewed today include:      Aortic Valve Replacement Sorin Carbomedics Top-Hat bileaflet mechanicalvalve (size 32m, cat# [id number], serial #[id number]   MitralValve Replacement Sorin Carbomedics Optiform bileaflet mechanicalvalve (size 327m cat#[id number], serial #[id number]  Echocardiogram: 06/2018  IMPRESSIONS    1. The left ventricle has normal systolic function with an ejection fraction of 60-65%. The cavity size was normal. There is mildly increased left ventricular wall thickness. Left ventricular diastolic Doppler parameters are consistent with  pseudonormalization No evidence of left ventricular regional wall motion abnormalities.  2. The right ventricle has normal systolic function. The cavity was normal. There is no increase in right ventricular wall thickness.  3. There is severe calcification. There is moderate to severe mitral annular calcification present. Mitral valve regurgitation is mild to moderate by color flow Doppler. Mean gradient across the mitral valve = 21 mmHg. Valve area by PHT is not  significantly decreased, but visually and by mean gradient, the mitral stenosis appears severe.  4. The aortic valve is tricuspid There is severe calcifcation of the aortic valve. There is moderate stenosis of the aortic valve. Mean gradient 34 mmHg with AVA 1.23 cm^2.  5. The pulmonic valve was normal in structure.  6. The aortic root and ascending aorta are normal in size and structure.  7. Left atrial size was severely dilated.  8. Right atrial pressure is estimated at 3 mmHg.  9. PA systolic  pressure 34 mmHg. 10. Trivial pericardial effusion. 11. The tricuspid valve is normal in structure.  RIGHT/LEFT HEART CATH AND CORONARY ANGIOGRAPHY 07/2018  Conclusion  1. No  significant CAD.  2. Mildly elevated PCWP.  3. Preserved cardiac output.  4. 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  5. Severe mitral stenosis.  6. Moderate aortic stenosis.        ASSESSMENT & PLAN:    1. Status post mechanical aortic and mitral valve Coumadin managed in clinic.  Has appointment with Dr. Roxy Manns early next week.  2.  Bilateral lower extremity edema/chronic diastolic heart failure -Likely due to inadequate dialysis session.  No  diuretics due to end-stage renal disease.  3.  Hypertension  -Blood pressure stable on current medication  Medication Adjustments/Labs and Tests Ordered: Current medicines are reviewed at length with the patient today.  Concerns regarding medicines are outlined above.  Medication changes, Labs and Tests ordered today are listed in the Patient Instructions below. Patient Instructions  Medication Instructions:  Your physician recommends that you continue on your current medications as directed. Please refer to the Current Medication list given to you today.  If you need a refill on your cardiac medications before your next appointment, please call your pharmacy.   Lab work: None ordered  If you have labs (blood work) drawn today and your tests are completely normal, you will receive your results only by: [name] MyChart Message (if you have MyChart) OR . A paper copy in the mail If you have any lab test that is abnormal or we need to change your treatment, we will call you to review the results.  Testing/Procedures: None ordered  Follow-Up: At [facility], you and your health needs are our priority.  As part of our continuing mission to provide you with exceptional heart care, we have created designated Provider Care Teams.  These Care Teams include your primary Cardiologist (physician) and Advanced Practice Providers (APPs -  Physician Assistants and Nurse Practitioners) who all work together to provide you with  the care you need, when you need it. You will need a follow up appointment in:  3 months.  Please call our office 2 months in advance to schedule this appointment.  You may see Dorris Carnes, MD or one of the following Advanced Practice Providers on your designated Care Team: Richardson Dopp, PA-C [place], [place] . Daune Perch, NP  Any Other Special Instructions Will Be Listed Below (If Applicable).       Jarrett Soho, [place]  12/16/2018 3:40 PM    [facility] [address], [place], [place]  [address] Phone: [phone]; Fax: [phone]

## 2018-12-17 ENCOUNTER — Telehealth: Payer: Self-pay

## 2018-12-17 ENCOUNTER — Telehealth: Payer: Self-pay | Admitting: Internal Medicine

## 2018-12-17 DIAGNOSIS — Z954 Presence of other heart-valve replacement: Secondary | ICD-10-CM

## 2018-12-17 DIAGNOSIS — N186 End stage renal disease: Secondary | ICD-10-CM | POA: Diagnosis not present

## 2018-12-17 DIAGNOSIS — M3210 Systemic lupus erythematosus, organ or system involvement unspecified: Secondary | ICD-10-CM | POA: Diagnosis not present

## 2018-12-17 DIAGNOSIS — I10 Essential (primary) hypertension: Secondary | ICD-10-CM

## 2018-12-17 DIAGNOSIS — I110 Hypertensive heart disease with heart failure: Secondary | ICD-10-CM

## 2018-12-17 DIAGNOSIS — D509 Iron deficiency anemia, unspecified: Secondary | ICD-10-CM | POA: Diagnosis not present

## 2018-12-17 DIAGNOSIS — R609 Edema, unspecified: Secondary | ICD-10-CM

## 2018-12-17 DIAGNOSIS — D631 Anemia in chronic kidney disease: Secondary | ICD-10-CM | POA: Diagnosis not present

## 2018-12-17 DIAGNOSIS — N2581 Secondary hyperparathyroidism of renal origin: Secondary | ICD-10-CM | POA: Diagnosis not present

## 2018-12-17 NOTE — Telephone Encounter (Signed)
New Message   Patient states that she went to dialysis today and they advised her to reach out to cardiology. She states that they believe that she may be in Afib. That her HR went up to 93. Patient states that she can feel it racing.

## 2018-12-17 NOTE — Telephone Encounter (Signed)
Forwarded to Dr. Harrington Challenger for review/recommendations.

## 2018-12-17 NOTE — Telephone Encounter (Signed)
Pt reports that PA at dialysis center recommended she call to report that she may be going into AFib (vavle replacement 7/14).  Near the end of each session her HRs are going into the 90s.  States 37 today and 98 Monday. She is not having elevated HRs outside of dialysis according to pt. Reports some dizziness occasionally. Reports lots of swelling in ankles/feet. (this was addressed at OV w/ PA yesterday in our office). Pt aware I will forward this to Dr. Alan Ripper nurse to either f/u with her or forward along to Dr Harrington Challenger for advisement.

## 2018-12-17 NOTE — Telephone Encounter (Signed)
PT is on coumadin so risk of thrombus eliminated Set up for longest Zio patch to evaluate

## 2018-12-17 NOTE — Telephone Encounter (Signed)
Patient contacted the office stating she needed a raised toilet seat and shower chair.  Patient is 3 weeks post op from AVR/ MVR 12/02/2018 with Dr. Roxy Manns.  I advised patient that she could get those items from a medical supply store.  No barriers identified before discharge from the hospital.  She acknowledged receipt.

## 2018-12-18 ENCOUNTER — Other Ambulatory Visit: Payer: Self-pay

## 2018-12-18 ENCOUNTER — Ambulatory Visit (INDEPENDENT_AMBULATORY_CARE_PROVIDER_SITE_OTHER): Payer: Self-pay | Admitting: *Deleted

## 2018-12-18 DIAGNOSIS — Z4802 Encounter for removal of sutures: Secondary | ICD-10-CM

## 2018-12-18 DIAGNOSIS — I050 Rheumatic mitral stenosis: Secondary | ICD-10-CM

## 2018-12-18 DIAGNOSIS — Z954 Presence of other heart-valve replacement: Secondary | ICD-10-CM

## 2018-12-18 DIAGNOSIS — I350 Nonrheumatic aortic (valve) stenosis: Secondary | ICD-10-CM

## 2018-12-18 NOTE — Progress Notes (Signed)
[name] returns s/p AVR/MVR on 12/02/18.  She has done well at home since discharge.  She is tolerating her diet with no nausea or vomiting.  Her bowels are good and she is using colace at present.  She does have some bilateral lower extremity swelling.  She says that she will be going to dialysis an extra day this week to help manage her edema.  She is on 2L of oxygen continuously at present.  Her sternal incision is healing very well.  I removed four sutures from previous chest tube sites.  These were also well healed.  She is not requiring narcotics for pain.  She will return as scheduled with a CXR.  She is having her INR checked.

## 2018-12-19 DIAGNOSIS — M3210 Systemic lupus erythematosus, organ or system involvement unspecified: Secondary | ICD-10-CM | POA: Diagnosis not present

## 2018-12-19 DIAGNOSIS — N2581 Secondary hyperparathyroidism of renal origin: Secondary | ICD-10-CM | POA: Diagnosis not present

## 2018-12-19 DIAGNOSIS — D509 Iron deficiency anemia, unspecified: Secondary | ICD-10-CM | POA: Diagnosis not present

## 2018-12-19 DIAGNOSIS — D631 Anemia in chronic kidney disease: Secondary | ICD-10-CM | POA: Diagnosis not present

## 2018-12-19 DIAGNOSIS — N186 End stage renal disease: Secondary | ICD-10-CM | POA: Diagnosis not present

## 2018-12-20 DIAGNOSIS — Z992 Dependence on renal dialysis: Secondary | ICD-10-CM | POA: Diagnosis not present

## 2018-12-20 DIAGNOSIS — M3210 Systemic lupus erythematosus, organ or system involvement unspecified: Secondary | ICD-10-CM | POA: Diagnosis not present

## 2018-12-20 DIAGNOSIS — I120 Hypertensive chronic kidney disease with stage 5 chronic kidney disease or end stage renal disease: Secondary | ICD-10-CM | POA: Diagnosis not present

## 2018-12-20 DIAGNOSIS — E8779 Other fluid overload: Secondary | ICD-10-CM | POA: Diagnosis not present

## 2018-12-20 DIAGNOSIS — N2581 Secondary hyperparathyroidism of renal origin: Secondary | ICD-10-CM | POA: Diagnosis not present

## 2018-12-20 DIAGNOSIS — D631 Anemia in chronic kidney disease: Secondary | ICD-10-CM | POA: Diagnosis not present

## 2018-12-20 DIAGNOSIS — D509 Iron deficiency anemia, unspecified: Secondary | ICD-10-CM | POA: Diagnosis not present

## 2018-12-20 DIAGNOSIS — N186 End stage renal disease: Secondary | ICD-10-CM | POA: Diagnosis not present

## 2018-12-22 DIAGNOSIS — D509 Iron deficiency anemia, unspecified: Secondary | ICD-10-CM | POA: Diagnosis not present

## 2018-12-22 DIAGNOSIS — M3210 Systemic lupus erythematosus, organ or system involvement unspecified: Secondary | ICD-10-CM | POA: Diagnosis not present

## 2018-12-22 DIAGNOSIS — N186 End stage renal disease: Secondary | ICD-10-CM | POA: Diagnosis not present

## 2018-12-22 DIAGNOSIS — Z992 Dependence on renal dialysis: Secondary | ICD-10-CM | POA: Diagnosis not present

## 2018-12-22 DIAGNOSIS — N2581 Secondary hyperparathyroidism of renal origin: Secondary | ICD-10-CM | POA: Diagnosis not present

## 2018-12-23 ENCOUNTER — Ambulatory Visit (INDEPENDENT_AMBULATORY_CARE_PROVIDER_SITE_OTHER): Payer: Medicare Other | Admitting: *Deleted

## 2018-12-23 ENCOUNTER — Other Ambulatory Visit: Payer: Self-pay

## 2018-12-23 DIAGNOSIS — Z5181 Encounter for therapeutic drug level monitoring: Secondary | ICD-10-CM

## 2018-12-23 DIAGNOSIS — Z954 Presence of other heart-valve replacement: Secondary | ICD-10-CM | POA: Diagnosis not present

## 2018-12-23 LAB — POCT INR: INR: 1.3 — AB (ref 2.0–3.0)

## 2018-12-23 NOTE — Patient Instructions (Signed)
Description   Take 1.5 tablets today and 2.5 tablets tomorrow, then start taking 2 tablets daily except 1 tablets on Sunday, Tuesday, and Thursday. Recheck INR in 1 week. Call Coumadin clinic with any problems [phone].

## 2018-12-24 DIAGNOSIS — D509 Iron deficiency anemia, unspecified: Secondary | ICD-10-CM | POA: Diagnosis not present

## 2018-12-24 DIAGNOSIS — N2581 Secondary hyperparathyroidism of renal origin: Secondary | ICD-10-CM | POA: Diagnosis not present

## 2018-12-24 DIAGNOSIS — M3210 Systemic lupus erythematosus, organ or system involvement unspecified: Secondary | ICD-10-CM | POA: Diagnosis not present

## 2018-12-24 DIAGNOSIS — N186 End stage renal disease: Secondary | ICD-10-CM | POA: Diagnosis not present

## 2018-12-24 DIAGNOSIS — Z992 Dependence on renal dialysis: Secondary | ICD-10-CM | POA: Diagnosis not present

## 2018-12-26 ENCOUNTER — Other Ambulatory Visit: Payer: Self-pay | Admitting: Thoracic Surgery (Cardiothoracic Vascular Surgery)

## 2018-12-26 DIAGNOSIS — M3210 Systemic lupus erythematosus, organ or system involvement unspecified: Secondary | ICD-10-CM | POA: Diagnosis not present

## 2018-12-26 DIAGNOSIS — N2581 Secondary hyperparathyroidism of renal origin: Secondary | ICD-10-CM | POA: Diagnosis not present

## 2018-12-26 DIAGNOSIS — D509 Iron deficiency anemia, unspecified: Secondary | ICD-10-CM | POA: Diagnosis not present

## 2018-12-26 DIAGNOSIS — Z954 Presence of other heart-valve replacement: Secondary | ICD-10-CM

## 2018-12-26 DIAGNOSIS — N186 End stage renal disease: Secondary | ICD-10-CM | POA: Diagnosis not present

## 2018-12-26 DIAGNOSIS — Z992 Dependence on renal dialysis: Secondary | ICD-10-CM | POA: Diagnosis not present

## 2018-12-29 ENCOUNTER — Ambulatory Visit (INDEPENDENT_AMBULATORY_CARE_PROVIDER_SITE_OTHER): Payer: Self-pay | Admitting: Physician Assistant

## 2018-12-29 ENCOUNTER — Ambulatory Visit
Admission: RE | Admit: 2018-12-29 | Discharge: 2018-12-29 | Disposition: A | Discharge status: Home / Self Care | Payer: Medicare Other | Source: Ambulatory Visit | Attending: Thoracic Surgery (Cardiothoracic Vascular Surgery) | Admitting: Thoracic Surgery (Cardiothoracic Vascular Surgery)

## 2018-12-29 ENCOUNTER — Other Ambulatory Visit: Payer: Self-pay

## 2018-12-29 VITALS — BP 145/74 | HR 100 | Temp 97.8°F | Resp 20 | Ht 66.0 in | Wt 136.0 lb

## 2018-12-29 DIAGNOSIS — D509 Iron deficiency anemia, unspecified: Secondary | ICD-10-CM | POA: Diagnosis not present

## 2018-12-29 DIAGNOSIS — J90 Pleural effusion, not elsewhere classified: Secondary | ICD-10-CM | POA: Diagnosis not present

## 2018-12-29 DIAGNOSIS — Z992 Dependence on renal dialysis: Secondary | ICD-10-CM | POA: Diagnosis not present

## 2018-12-29 DIAGNOSIS — Z954 Presence of other heart-valve replacement: Secondary | ICD-10-CM

## 2018-12-29 DIAGNOSIS — M3210 Systemic lupus erythematosus, organ or system involvement unspecified: Secondary | ICD-10-CM | POA: Diagnosis not present

## 2018-12-29 DIAGNOSIS — N2581 Secondary hyperparathyroidism of renal origin: Secondary | ICD-10-CM | POA: Diagnosis not present

## 2018-12-29 DIAGNOSIS — N186 End stage renal disease: Secondary | ICD-10-CM | POA: Diagnosis not present

## 2018-12-29 NOTE — Progress Notes (Signed)
HPI:  Patient returns for routine postoperative follow-up having undergone AVR and MVR on 12/02/2018.  The patient's early postoperative recovery while in the hospital was notable for development of right sided pneumothorax which required chest tube placement. She required oxygen at discharge. Since hospital discharge the patient reports she is doing okay.  She states that she continues to have shortness of breath and fatigue.  She states her fluid status is much better since leaving the hospital as her dialysis as really helped.  She does have some pain along her left chest and back.  She has not been able to sleep despite Trazodone ordered by Nephrologist.  She is on an increased dose of coumadin, as her INR has dropped.  She is scheduled to have PT/INR drawn tomorrow.   Current Outpatient Medications  Medication Sig Dispense Refill  . amLODipine (NORVASC) 10 MG tablet Take 1 tablet (10 mg total) by mouth at bedtime. 30 tablet 4  . aspirin EC 81 MG tablet Take 81 mg by mouth at bedtime.    . calcitRIOL (ROCALTROL) 0.25 MCG capsule Take 7 capsules (1.75 mcg total) by mouth every other day. 14 capsule 0  . cloNIDine (CATAPRES) 0.2 MG tablet Take 1 tablet (0.2 mg) by mouth twice daily - on dialysis days take after dialysis and at bedtime, on other days take one in the morning and one at night 180 tablet 3  . ferric citrate (AURYXIA) 1 GM 210 MG(Fe) tablet Take 840 mg by mouth 3 (three) times daily with meals.     [name] labetalol (NORMODYNE) 300 MG tablet Take 1 tablet (300 mg total) by mouth 2 (two) times daily. 90 tablet 3  . multivitamin (RENA-VIT) TABS tablet Take 1 tablet by mouth at bedtime.     . ondansetron (ZOFRAN) 4 MG tablet Take 4 mg by mouth every 6 (six) hours as needed for nausea or vomiting.    . pantoprazole (PROTONIX) 40 MG tablet Take 40 mg by mouth 2 (two) times daily.     . predniSONE (DELTASONE) 10 MG tablet Take 10 mg by mouth daily with breakfast.    . traMADol (ULTRAM) 50 MG  tablet Take 1 tablet (50 mg total) by mouth every 4 (four) hours as needed for moderate pain. 30 tablet 0  . warfarin (COUMADIN) 3 MG tablet Take 1 tablet (3 mg total) by mouth daily. 30 tablet 11   No current facility-administered medications for this visit.     Physical Exam:  BP (!) 145/74   Pulse 100   Temp 97.8 F (36.6 C) (Skin)   Resp 20   Ht 5\' 6"  (1.676 m)   Wt 136 lb (61.7 kg)   LMP 12/05/2010 (LMP Unknown)   SpO2 91% Comment: 2L O2 per [place]  BMI 21.95 kg/m   Gen: no apparent distress, looks great Heart: RRR Lungs: diminished right base Ext: 1+ pitting edema bilaterally Incisions: clean and dry  Diagnostic Tests:  CXR; right sided pleural effusion  A/P:  1. S/p AVR and MVR with mechanical valves- she is on coumadin, her last INR 1.3, she is scheduled to have repeat INR tomorrow, if she is not therapeutic she will need admitted with Heparin bridge until INR is therapeutic 2. Right Pleural effusion- small, patient would like to avoid procedure if possible, will continue HD as scheduled, repeat CXR in 2 weeks if persists she may require Thoracentesis 3. Activity- will continue to increase as tolerated 4. Dispo- RTC in 2 weeks with repeat CXR.... I  have spoken with Cardiology office in regards to sub therapeutic INR..... due for repeat tomorrow if remains low will need admission to the hospital with Heparin bridge.   [name], PA-C [facility] [phone]

## 2018-12-29 NOTE — Patient Instructions (Signed)
You may return to driving an automobile as long as you are no longer requiring oral narcotic pain relievers during the daytime.  It would be wise to start driving only short distances during the daylight and gradually increase from there as you feel comfortable.  Endocarditis is a potentially serious infection of heart valves or inside lining of the heart.  It occurs more commonly in patients with diseased heart valves (such as patient's with aortic or mitral valve disease) and in patients who have undergone heart valve repair or replacement.  Certain surgical and dental procedures may put you at risk, such as dental cleaning, other dental procedures, or any surgery involving the respiratory, urinary, gastrointestinal tract, gallbladder or prostate gland.   To minimize your chances for develooping endocarditis, maintain good oral health and seek prompt medical attention for any infections involving the mouth, teeth, gums, skin or urinary tract.    Always notify your doctor or dentist about your underlying heart valve condition before having any invasive procedures. You will need to take antibiotics before certain procedures, including all routine dental cleanings or other dental procedures.  Your cardiologist or dentist should prescribe these antibiotics for you to be taken ahead of time.  Make every effort to maintain a "heart-healthy" lifestyle with regular physical exercise and adherence to a low-fat, low-carbohydrate diet.  Continue to seek regular follow-up appointments with your primary care physician and/or cardiologist.   You may continue to gradually increase your physical activity as tolerated.  Refrain from any heavy lifting or strenuous use of your arms and shoulders until at least 8 weeks from the time of your surgery, and avoid activities that cause increased pain in your chest on the side of your surgical incision.  Otherwise you may continue to increase activities without any particular  limitations.  Increase the intensity and duration of physical activity gradually.

## 2018-12-30 ENCOUNTER — Other Ambulatory Visit: Payer: Self-pay | Admitting: Internal Medicine

## 2018-12-30 ENCOUNTER — Ambulatory Visit (INDEPENDENT_AMBULATORY_CARE_PROVIDER_SITE_OTHER): Payer: Medicare Other

## 2018-12-30 ENCOUNTER — Ambulatory Visit (INDEPENDENT_AMBULATORY_CARE_PROVIDER_SITE_OTHER): Payer: Medicare Other | Admitting: *Deleted

## 2018-12-30 DIAGNOSIS — R009 Unspecified abnormalities of heart beat: Secondary | ICD-10-CM | POA: Diagnosis not present

## 2018-12-30 DIAGNOSIS — Z954 Presence of other heart-valve replacement: Secondary | ICD-10-CM | POA: Diagnosis not present

## 2018-12-30 DIAGNOSIS — I10 Essential (primary) hypertension: Secondary | ICD-10-CM

## 2018-12-30 DIAGNOSIS — R000 Tachycardia, unspecified: Secondary | ICD-10-CM

## 2018-12-30 DIAGNOSIS — Z5181 Encounter for therapeutic drug level monitoring: Secondary | ICD-10-CM

## 2018-12-30 DIAGNOSIS — I5032 Chronic diastolic (congestive) heart failure: Secondary | ICD-10-CM | POA: Diagnosis not present

## 2018-12-30 DIAGNOSIS — I110 Hypertensive heart disease with heart failure: Secondary | ICD-10-CM

## 2018-12-30 DIAGNOSIS — R609 Edema, unspecified: Secondary | ICD-10-CM

## 2018-12-30 LAB — POCT INR: INR: 1.8 — AB (ref 2.0–3.0)

## 2018-12-30 MED ORDER — ENOXAPARIN SODIUM 60 MG/0.6ML ~~LOC~~ SOLN: 60.0000 mg | SUBCUTANEOUS | 0 refills | Status: DC

## 2018-12-30 NOTE — Patient Instructions (Addendum)
Description   Take 3 tablets today, then start taking 2 tablets daily. Start taking Lovenox once a day until Friday. Recheck INR on 01/02/2019. Call Coumadin clinic with any problems [phone].

## 2018-12-30 NOTE — Telephone Encounter (Signed)
Spoke with patient. Explained plan for monitor. She will come to office at 1:30 pm today.      COVID-19 Pre-Screening Questions:  . In the past 7 to 10 days have you had a cough,  shortness of breath, headache, congestion, fever (100 or greater) body aches, chills, sore throat, or sudden loss of taste or sense of smell?  NO . Have you been around anyone with known Covid 19.  NO . Have you been around anyone who is awaiting Covid 19 test results in the past 7 to 10 days?  NO . Have you been around anyone who has been exposed to Covid 19, or has mentioned symptoms of Covid 19 within the past 7 to 10 days?  NO  If you have any concerns/questions about symptoms patients report during screening (either on the phone or at threshold). Contact the provider seeing the patient or DOD for further guidance.  If neither are available contact a member of the leadership team.

## 2018-12-30 NOTE — Telephone Encounter (Signed)
Long term monitor, 14 day zio patch ordered. Pt coming today for INR check.  Will send message to monitor tech to see if possible this can be placed today.

## 2018-12-30 NOTE — Telephone Encounter (Signed)
Per Monitor Tech, [name], pt can have zio patch placed today since she is coming in for INR check.   Left message for patient to call back to schedule this.

## 2018-12-31 DIAGNOSIS — N2581 Secondary hyperparathyroidism of renal origin: Secondary | ICD-10-CM | POA: Diagnosis not present

## 2018-12-31 DIAGNOSIS — Z992 Dependence on renal dialysis: Secondary | ICD-10-CM | POA: Diagnosis not present

## 2018-12-31 DIAGNOSIS — N186 End stage renal disease: Secondary | ICD-10-CM | POA: Diagnosis not present

## 2018-12-31 DIAGNOSIS — D509 Iron deficiency anemia, unspecified: Secondary | ICD-10-CM | POA: Diagnosis not present

## 2018-12-31 DIAGNOSIS — M3210 Systemic lupus erythematosus, organ or system involvement unspecified: Secondary | ICD-10-CM | POA: Diagnosis not present

## 2019-01-02 ENCOUNTER — Other Ambulatory Visit: Payer: Self-pay

## 2019-01-02 ENCOUNTER — Ambulatory Visit (INDEPENDENT_AMBULATORY_CARE_PROVIDER_SITE_OTHER): Payer: Medicare Other | Admitting: Pharmacist

## 2019-01-02 ENCOUNTER — Encounter (INDEPENDENT_AMBULATORY_CARE_PROVIDER_SITE_OTHER): Payer: Self-pay

## 2019-01-02 DIAGNOSIS — D509 Iron deficiency anemia, unspecified: Secondary | ICD-10-CM | POA: Diagnosis not present

## 2019-01-02 DIAGNOSIS — Z992 Dependence on renal dialysis: Secondary | ICD-10-CM | POA: Diagnosis not present

## 2019-01-02 DIAGNOSIS — Z954 Presence of other heart-valve replacement: Secondary | ICD-10-CM | POA: Diagnosis not present

## 2019-01-02 DIAGNOSIS — N2581 Secondary hyperparathyroidism of renal origin: Secondary | ICD-10-CM | POA: Diagnosis not present

## 2019-01-02 DIAGNOSIS — Z5181 Encounter for therapeutic drug level monitoring: Secondary | ICD-10-CM | POA: Diagnosis not present

## 2019-01-02 DIAGNOSIS — N186 End stage renal disease: Secondary | ICD-10-CM | POA: Diagnosis not present

## 2019-01-02 DIAGNOSIS — M3210 Systemic lupus erythematosus, organ or system involvement unspecified: Secondary | ICD-10-CM | POA: Diagnosis not present

## 2019-01-02 LAB — POCT INR: INR: 2.2 (ref 2.0–3.0)

## 2019-01-02 NOTE — Patient Instructions (Signed)
Give yourself one more lovenox injection tonight. Continue taking warfarin 2 tablets daily.  Recheck INR on 8/18. Call Coumadin clinic with any problems [phone].

## 2019-01-05 ENCOUNTER — Other Ambulatory Visit: Payer: Self-pay

## 2019-01-05 ENCOUNTER — Emergency Department (HOSPITAL_COMMUNITY)
Admission: EM | Admit: 2019-01-05 | Discharge: 2019-01-05 | Disposition: A | Discharge status: Home / Self Care | Payer: Medicare Other | Source: Home / Self Care

## 2019-01-05 ENCOUNTER — Encounter (HOSPITAL_COMMUNITY): Payer: Self-pay

## 2019-01-05 DIAGNOSIS — Z20828 Contact with and (suspected) exposure to other viral communicable diseases: Secondary | ICD-10-CM | POA: Diagnosis not present

## 2019-01-05 DIAGNOSIS — R109 Unspecified abdominal pain: Secondary | ICD-10-CM | POA: Insufficient documentation

## 2019-01-05 DIAGNOSIS — Z5321 Procedure and treatment not carried out due to patient leaving prior to being seen by health care provider: Secondary | ICD-10-CM | POA: Insufficient documentation

## 2019-01-05 DIAGNOSIS — Z992 Dependence on renal dialysis: Secondary | ICD-10-CM | POA: Diagnosis not present

## 2019-01-05 DIAGNOSIS — M7989 Other specified soft tissue disorders: Secondary | ICD-10-CM | POA: Diagnosis not present

## 2019-01-05 DIAGNOSIS — N186 End stage renal disease: Secondary | ICD-10-CM | POA: Diagnosis not present

## 2019-01-05 DIAGNOSIS — K429 Umbilical hernia without obstruction or gangrene: Secondary | ICD-10-CM | POA: Diagnosis not present

## 2019-01-05 DIAGNOSIS — M3210 Systemic lupus erythematosus, organ or system involvement unspecified: Secondary | ICD-10-CM | POA: Diagnosis not present

## 2019-01-05 DIAGNOSIS — D509 Iron deficiency anemia, unspecified: Secondary | ICD-10-CM | POA: Diagnosis not present

## 2019-01-05 DIAGNOSIS — J90 Pleural effusion, not elsewhere classified: Secondary | ICD-10-CM | POA: Diagnosis not present

## 2019-01-05 DIAGNOSIS — N2581 Secondary hyperparathyroidism of renal origin: Secondary | ICD-10-CM | POA: Diagnosis not present

## 2019-01-05 DIAGNOSIS — I517 Cardiomegaly: Secondary | ICD-10-CM | POA: Diagnosis not present

## 2019-01-05 NOTE — ED Notes (Signed)
Called Pt for vitals no answer. 

## 2019-01-05 NOTE — ED Notes (Signed)
Pt Called several times for vitals and room no answer.

## 2019-01-05 NOTE — ED Triage Notes (Signed)
Onset 1 week hard knot at umbilicus that is painful to touch.  Pt on oxygen 2L via [place] since being discharged from hospital in July.

## 2019-01-06 ENCOUNTER — Other Ambulatory Visit: Payer: Self-pay

## 2019-01-06 ENCOUNTER — Emergency Department (HOSPITAL_COMMUNITY): Payer: Medicare Other

## 2019-01-06 ENCOUNTER — Inpatient Hospital Stay (HOSPITAL_COMMUNITY)
Admission: EM | Admit: 2019-01-06 | Discharge: 2019-01-09 | DRG: 186 | Disposition: A | Discharge status: Home / Self Care | Payer: Medicare Other | Attending: Family Medicine | Admitting: Family Medicine

## 2019-01-06 ENCOUNTER — Telehealth: Payer: Self-pay | Admitting: Pharmacist

## 2019-01-06 ENCOUNTER — Encounter (HOSPITAL_COMMUNITY): Payer: Self-pay | Admitting: *Deleted

## 2019-01-06 DIAGNOSIS — K429 Umbilical hernia without obstruction or gangrene: Secondary | ICD-10-CM

## 2019-01-06 DIAGNOSIS — F1290 Cannabis use, unspecified, uncomplicated: Secondary | ICD-10-CM | POA: Diagnosis present

## 2019-01-06 DIAGNOSIS — Z8249 Family history of ischemic heart disease and other diseases of the circulatory system: Secondary | ICD-10-CM

## 2019-01-06 DIAGNOSIS — I509 Heart failure, unspecified: Secondary | ICD-10-CM | POA: Diagnosis not present

## 2019-01-06 DIAGNOSIS — Z954 Presence of other heart-valve replacement: Secondary | ICD-10-CM

## 2019-01-06 DIAGNOSIS — Z992 Dependence on renal dialysis: Secondary | ICD-10-CM | POA: Diagnosis not present

## 2019-01-06 DIAGNOSIS — J90 Pleural effusion, not elsewhere classified: Principal | ICD-10-CM

## 2019-01-06 DIAGNOSIS — J9811 Atelectasis: Secondary | ICD-10-CM | POA: Diagnosis present

## 2019-01-06 DIAGNOSIS — E871 Hypo-osmolality and hyponatremia: Secondary | ICD-10-CM | POA: Diagnosis present

## 2019-01-06 DIAGNOSIS — Z952 Presence of prosthetic heart valve: Secondary | ICD-10-CM

## 2019-01-06 DIAGNOSIS — Z7982 Long term (current) use of aspirin: Secondary | ICD-10-CM

## 2019-01-06 DIAGNOSIS — I132 Hypertensive heart and chronic kidney disease with heart failure and with stage 5 chronic kidney disease, or end stage renal disease: Secondary | ICD-10-CM | POA: Diagnosis not present

## 2019-01-06 DIAGNOSIS — F40240 Claustrophobia: Secondary | ICD-10-CM | POA: Diagnosis present

## 2019-01-06 DIAGNOSIS — F17210 Nicotine dependence, cigarettes, uncomplicated: Secondary | ICD-10-CM | POA: Diagnosis present

## 2019-01-06 DIAGNOSIS — K219 Gastro-esophageal reflux disease without esophagitis: Secondary | ICD-10-CM | POA: Diagnosis present

## 2019-01-06 DIAGNOSIS — Z841 Family history of disorders of kidney and ureter: Secondary | ICD-10-CM

## 2019-01-06 DIAGNOSIS — M329 Systemic lupus erythematosus, unspecified: Secondary | ICD-10-CM | POA: Diagnosis present

## 2019-01-06 DIAGNOSIS — Z20828 Contact with and (suspected) exposure to other viral communicable diseases: Secondary | ICD-10-CM | POA: Diagnosis present

## 2019-01-06 DIAGNOSIS — M7989 Other specified soft tissue disorders: Secondary | ICD-10-CM

## 2019-01-06 DIAGNOSIS — N2581 Secondary hyperparathyroidism of renal origin: Secondary | ICD-10-CM | POA: Diagnosis present

## 2019-01-06 DIAGNOSIS — Z8673 Personal history of transient ischemic attack (TIA), and cerebral infarction without residual deficits: Secondary | ICD-10-CM

## 2019-01-06 DIAGNOSIS — R0902 Hypoxemia: Secondary | ICD-10-CM | POA: Diagnosis present

## 2019-01-06 DIAGNOSIS — D631 Anemia in chronic kidney disease: Secondary | ICD-10-CM | POA: Diagnosis present

## 2019-01-06 DIAGNOSIS — I110 Hypertensive heart disease with heart failure: Secondary | ICD-10-CM | POA: Diagnosis present

## 2019-01-06 DIAGNOSIS — Z79899 Other long term (current) drug therapy: Secondary | ICD-10-CM

## 2019-01-06 DIAGNOSIS — Z9981 Dependence on supplemental oxygen: Secondary | ICD-10-CM

## 2019-01-06 DIAGNOSIS — Z716 Tobacco abuse counseling: Secondary | ICD-10-CM

## 2019-01-06 DIAGNOSIS — R229 Localized swelling, mass and lump, unspecified: Secondary | ICD-10-CM | POA: Diagnosis present

## 2019-01-06 DIAGNOSIS — F1490 Cocaine use, unspecified, uncomplicated: Secondary | ICD-10-CM | POA: Diagnosis present

## 2019-01-06 DIAGNOSIS — G8929 Other chronic pain: Secondary | ICD-10-CM | POA: Diagnosis present

## 2019-01-06 DIAGNOSIS — I5032 Chronic diastolic (congestive) heart failure: Secondary | ICD-10-CM | POA: Diagnosis present

## 2019-01-06 DIAGNOSIS — I517 Cardiomegaly: Secondary | ICD-10-CM | POA: Diagnosis not present

## 2019-01-06 DIAGNOSIS — Z9889 Other specified postprocedural states: Secondary | ICD-10-CM

## 2019-01-06 DIAGNOSIS — Z8711 Personal history of peptic ulcer disease: Secondary | ICD-10-CM

## 2019-01-06 DIAGNOSIS — Z7952 Long term (current) use of systemic steroids: Secondary | ICD-10-CM

## 2019-01-06 DIAGNOSIS — N186 End stage renal disease: Secondary | ICD-10-CM | POA: Diagnosis not present

## 2019-01-06 DIAGNOSIS — Z79891 Long term (current) use of opiate analgesic: Secondary | ICD-10-CM

## 2019-01-06 DIAGNOSIS — Z7901 Long term (current) use of anticoagulants: Secondary | ICD-10-CM

## 2019-01-06 LAB — COMPREHENSIVE METABOLIC PANEL
ALT: 24 U/L (ref 0–44)
AST: 28 U/L (ref 15–41)
Albumin: 3 g/dL — ABNORMAL LOW (ref 3.5–5.0)
Alkaline Phosphatase: 76 U/L (ref 38–126)
Anion gap: 14 (ref 5–15)
BUN: 26 mg/dL — ABNORMAL HIGH (ref 6–20)
CO2: 31 mmol/L (ref 22–32)
Calcium: 9 mg/dL (ref 8.9–10.3)
Chloride: 89 mmol/L — ABNORMAL LOW (ref 98–111)
Creatinine, Ser: 5.86 mg/dL — ABNORMAL HIGH (ref 0.44–1.00)
GFR calc Af Amer: 9 mL/min — ABNORMAL LOW (ref >60)
GFR calc non Af Amer: 7 mL/min — ABNORMAL LOW (ref >60)
Glucose, Bld: 103 mg/dL — ABNORMAL HIGH (ref 70–99)
Potassium: 3.4 mmol/L — ABNORMAL LOW (ref 3.5–5.1)
Sodium: 134 mmol/L — ABNORMAL LOW (ref 135–145)
Total Bilirubin: 0.7 mg/dL (ref 0.3–1.2)
Total Protein: 6.7 g/dL (ref 6.5–8.1)

## 2019-01-06 LAB — CBC
HCT: 25.1 % — ABNORMAL LOW (ref 36.0–46.0)
Hemoglobin: 7.4 g/dL — ABNORMAL LOW (ref 12.0–15.0)
MCH: 30.2 pg (ref 26.0–34.0)
MCHC: 29.5 g/dL — ABNORMAL LOW (ref 30.0–36.0)
MCV: 102.4 fL — ABNORMAL HIGH (ref 80.0–100.0)
Platelets: 234 10*3/uL (ref 150–400)
RBC: 2.45 MIL/uL — ABNORMAL LOW (ref 3.87–5.11)
RDW: 17.5 % — ABNORMAL HIGH (ref 11.5–15.5)
WBC: 5.7 10*3/uL (ref 4.0–10.5)
nRBC: 0 % (ref 0.0–0.2)

## 2019-01-06 LAB — SARS CORONAVIRUS 2 BY RT PCR (HOSPITAL ORDER, PERFORMED IN ~~LOC~~ HOSPITAL LAB): SARS Coronavirus 2: NEGATIVE

## 2019-01-06 LAB — PROTIME-INR
INR: 1.7 — ABNORMAL HIGH (ref 0.8–1.2)
Prothrombin Time: 19.7 seconds — ABNORMAL HIGH (ref 11.4–15.2)

## 2019-01-06 MED ORDER — LABETALOL HCL 200 MG PO TABS
300.0000 mg | ORAL_TABLET | Freq: Two times a day (BID) | ORAL | Status: DC
  Administered 2019-01-06 – 2019-01-09 (×5): 300 mg via ORAL
  Filled 2019-01-06 (×6): qty 1

## 2019-01-06 MED ORDER — LORAZEPAM 2 MG/ML IJ SOLN
0.5000 mg | Freq: Once | INTRAMUSCULAR | Status: AC
  Administered 2019-01-06: 0.5 mg via INTRAVENOUS

## 2019-01-06 MED ORDER — LORAZEPAM 2 MG/ML IJ SOLN
0.5000 mg | Freq: Once | INTRAMUSCULAR | Status: AC
  Administered 2019-01-06: 0.5 mg via INTRAVENOUS
  Filled 2019-01-06: qty 1

## 2019-01-06 MED ORDER — ONDANSETRON HCL 4 MG PO TABS: 4.0000 mg | ORAL_TABLET | Freq: Four times a day (QID) | ORAL | Status: DC | PRN

## 2019-01-06 MED ORDER — ACETAMINOPHEN 650 MG RE SUPP: 650.0000 mg | Freq: Four times a day (QID) | RECTAL | Status: DC | PRN

## 2019-01-06 MED ORDER — CALCITRIOL 0.25 MCG PO CAPS
1.7500 ug | ORAL_CAPSULE | ORAL | Status: DC
  Administered 2019-01-07: 1.75 ug via ORAL
  Filled 2019-01-06: qty 7

## 2019-01-06 MED ORDER — PREDNISONE 10 MG PO TABS: 10.0000 mg | ORAL_TABLET | Freq: Every day | ORAL | Status: DC

## 2019-01-06 MED ORDER — CHLORHEXIDINE GLUCONATE CLOTH 2 % EX PADS
6.0000 | MEDICATED_PAD | Freq: Every day | CUTANEOUS | Status: DC
  Administered 2019-01-07 – 2019-01-09 (×2): 6 via TOPICAL

## 2019-01-06 MED ORDER — SODIUM CHLORIDE 0.9% FLUSH: 3.0000 mL | INTRAVENOUS | Status: DC | PRN

## 2019-01-06 MED ORDER — CLONIDINE HCL 0.2 MG PO TABS: 0.2000 mg | ORAL_TABLET | ORAL | Status: DC

## 2019-01-06 MED ORDER — SODIUM CHLORIDE 0.9 % IV SOLN
250.0000 mL | INTRAVENOUS | Status: DC | PRN
  Administered 2019-01-09: 250 mL via INTRAVENOUS

## 2019-01-06 MED ORDER — PANTOPRAZOLE SODIUM 40 MG PO TBEC
40.0000 mg | DELAYED_RELEASE_TABLET | Freq: Two times a day (BID) | ORAL | Status: DC
  Administered 2019-01-06 – 2019-01-09 (×5): 40 mg via ORAL
  Filled 2019-01-06 (×6): qty 1

## 2019-01-06 MED ORDER — CLONIDINE HCL 0.2 MG PO TABS
0.2000 mg | ORAL_TABLET | Freq: Two times a day (BID) | ORAL | Status: DC
  Administered 2019-01-06 – 2019-01-09 (×5): 0.2 mg via ORAL
  Filled 2019-01-06 (×6): qty 1

## 2019-01-06 MED ORDER — AMLODIPINE BESYLATE 10 MG PO TABS
10.0000 mg | ORAL_TABLET | Freq: Every day | ORAL | Status: DC
  Administered 2019-01-06 – 2019-01-08 (×3): 10 mg via ORAL
  Filled 2019-01-06 (×3): qty 1

## 2019-01-06 MED ORDER — FERRIC CITRATE 1 GM 210 MG(FE) PO TABS
840.0000 mg | ORAL_TABLET | Freq: Three times a day (TID) | ORAL | Status: DC
  Administered 2019-01-07 – 2019-01-09 (×5): 840 mg via ORAL
  Filled 2019-01-06 (×5): qty 4

## 2019-01-06 MED ORDER — IOHEXOL 300 MG/ML  SOLN
100.0000 mL | Freq: Once | INTRAMUSCULAR | Status: AC | PRN
  Administered 2019-01-06: 14:00:00 100 mL via INTRAVENOUS

## 2019-01-06 MED ORDER — RENA-VITE PO TABS
1.0000 | ORAL_TABLET | Freq: Every day | ORAL | Status: DC
  Administered 2019-01-06 – 2019-01-08 (×3): 1 via ORAL
  Filled 2019-01-06 (×3): qty 1

## 2019-01-06 MED ORDER — LORAZEPAM 2 MG/ML IJ SOLN: 0.5000 mg | Freq: Once | INTRAMUSCULAR | Status: DC

## 2019-01-06 MED ORDER — ACETAMINOPHEN 325 MG PO TABS
650.0000 mg | ORAL_TABLET | Freq: Four times a day (QID) | ORAL | Status: DC | PRN
  Administered 2019-01-09: 650 mg via ORAL

## 2019-01-06 MED ORDER — SODIUM CHLORIDE 0.9% FLUSH: 3.0000 mL | Freq: Once | INTRAVENOUS | Status: DC

## 2019-01-06 MED ORDER — ONDANSETRON HCL 4 MG/2ML IJ SOLN: 4.0000 mg | Freq: Four times a day (QID) | INTRAMUSCULAR | Status: DC | PRN

## 2019-01-06 MED ORDER — SODIUM CHLORIDE 0.9% FLUSH
3.0000 mL | Freq: Two times a day (BID) | INTRAVENOUS | Status: DC
  Administered 2019-01-06 – 2019-01-07 (×3): 3 mL via INTRAVENOUS

## 2019-01-06 MED ORDER — LORAZEPAM 2 MG/ML IJ SOLN
0.5000 mg | Freq: Once | INTRAMUSCULAR | Status: DC
  Filled 2019-01-06: qty 1

## 2019-01-06 MED ORDER — HYDROCODONE-ACETAMINOPHEN 5-325 MG PO TABS
1.0000 | ORAL_TABLET | ORAL | Status: DC | PRN
  Administered 2019-01-07 – 2019-01-08 (×6): 1 via ORAL
  Filled 2019-01-06 (×6): qty 1

## 2019-01-06 MED ORDER — TRAMADOL HCL 50 MG PO TABS
50.0000 mg | ORAL_TABLET | ORAL | Status: DC | PRN
  Administered 2019-01-06 – 2019-01-07 (×2): 50 mg via ORAL
  Filled 2019-01-06 (×2): qty 1

## 2019-01-06 NOTE — ED Triage Notes (Signed)
Pt has hardened area to umbilicus that is painful; denies NVD; hx of hernia

## 2019-01-06 NOTE — Consult Note (Signed)
Reason for Consult: ESRD Referring Physician:  Dr. Merton Border  Chief Complaint:  Abdominal pain  HD orders: [facility] MWF, 4 hrs 0 min, 180NRe Optiflux [name] 60.5 (kg) (lowest weight past 3 treatments as been 61.3kg), Dialysate 2.0 K, 2.0 Ca, 1.0 Mg, 100 Dextrose ([id number]), Sodium 137 (mEq/L), Bicarb Setting: 35 (mEq/L), UFR Profile: Profile 2, Sodium Model: None, Access: AVFistula Heparin 1800U bolus Mircera 225 q2weeks (last given 8/5) Sensipar 120 PO TIW   Assessment/Plan: 1. ESRD - MWF -> plan on HD in the AM. 2. Large left sided pleural effusion - going for thoracentesis tomorrow. 3. S/p AVR/MVR on 12/02/2018 on anticoagulation. 4. SLE - on steroids 5. HTN - continue home regimen + UF tomorrow w/ HD 6. CHF - compensated but mildly dyspneic likely from the left sided effusion. 7. Umbilical hernia - not incarcerated and reducible but there is some possibly underlying inflammation by CT.     HPI: [name] is an 58 y.o. female  Aortic stenosis s/p AVR 11/2018, on home O2, ESRD p/w abdomianl pain for the past days prior to admission not associated with n/v/d. Pt found to be dyspneic in the ED and w/u revealed b/l pleural effusions worse on the left side. She has a h/o pleural effusions diagnosed a week ago for which she's followed as an outpt; plan was to evaluate on 01/12/19 and determine if it's improving but on .   ROS Pertinent items are noted in HPI.  Chemistry and CBC: Creatinine, Ser  Date/Time Value Ref Range Status  01/06/2019 06:29 AM 5.86 (H) 0.44 - 1.00 mg/dL Final  12/11/2018 02:54 AM 4.82 (H) 0.44 - 1.00 mg/dL Final  12/10/2018 02:52 AM 6.95 (H) 0.44 - 1.00 mg/dL Final  12/09/2018 02:56 AM 4.74 (H) 0.44 - 1.00 mg/dL Final    Comment:    DELTA CHECK NOTED DIALYSIS   12/08/2018 03:17 AM 8.65 (H) 0.44 - 1.00 mg/dL Final  12/07/2018 08:44 AM 7.37 (H) 0.44 - 1.00 mg/dL Final  12/06/2018 08:14 AM 5.64 (H) 0.44 - 1.00 mg/dL Final  12/05/2018 08:08 AM 7.33 (H) 0.44 - 1.00 mg/dL  Final  12/05/2018 03:22 AM 7.06 (H) 0.44 - 1.00 mg/dL Final  12/04/2018 04:33 AM 5.37 (H) 0.44 - 1.00 mg/dL Final    Comment:    DELTA CHECK NOTED DIALYSIS   12/03/2018 04:46 PM 3.21 (H) 0.44 - 1.00 mg/dL Final    Comment:    DIALYSIS  12/03/2018 03:59 AM 6.99 (H) 0.44 - 1.00 mg/dL Final  12/02/2018 07:17 PM 6.51 (H) 0.44 - 1.00 mg/dL Final  12/02/2018 02:17 PM 5.60 (H) 0.44 - 1.00 mg/dL Final  11/27/2018 12:25 PM 7.15 (H) 0.44 - 1.00 mg/dL Final  07/22/2018 02:18 PM 6.56 (H) 0.44 - 1.00 mg/dL Final  03/15/2018 07:04 AM 4.81 (H) 0.44 - 1.00 mg/dL Final    Comment:    DELTA CHECK NOTED  03/14/2018 02:10 PM 2.77 (H) 0.44 - 1.00 mg/dL Final  10/25/2017 07:47 AM 6.93 (H) 0.44 - 1.00 mg/dL Final  10/24/2017 10:07 AM 4.88 (H) 0.44 - 1.00 mg/dL Final    Comment:    DELTA CHECK NOTED DIALYSIS   10/23/2017 04:41 AM 7.72 (H) 0.44 - 1.00 mg/dL Final    Comment:    DELTA CHECK NOTED  10/22/2017 04:47 AM 5.10 (H) 0.44 - 1.00 mg/dL Final    Comment:    DELTA CHECK NOTED  10/21/2017 11:41 AM 10.33 (H) 0.44 - 1.00 mg/dL Final  10/21/2017 10:33 AM 9.90 (H) 0.44 - 1.00 mg/dL  Final  10/14/2017 03:20 PM 3.51 (H) 0.44 - 1.00 mg/dL Final  05/15/2017 07:31 PM 5.35 (H) 0.44 - 1.00 mg/dL Final  01/30/2017 08:00 AM 6.99 (H) 0.44 - 1.00 mg/dL Final  01/28/2017 02:41 PM 8.99 (H) 0.44 - 1.00 mg/dL Final  01/27/2017 09:00 AM 6.52 (H) 0.44 - 1.00 mg/dL Final  01/27/2017 04:41 AM 6.54 (H) 0.44 - 1.00 mg/dL Final  01/26/2017 04:59 PM 5.55 (H) 0.44 - 1.00 mg/dL Final  10/29/2016 03:23 PM 2.87 (H) 0.44 - 1.00 mg/dL Final  10/09/2016 11:00 AM 5.51 (H) 0.44 - 1.00 mg/dL Final  07/12/2015 09:33 PM 7.93 (H) 0.44 - 1.00 mg/dL Final  05/05/2015 07:25 AM 5.85 (H) 0.44 - 1.00 mg/dL Final  05/04/2015 04:27 AM 8.24 (H) 0.44 - 1.00 mg/dL Final  05/03/2015 05:35 AM 6.04 (H) 0.44 - 1.00 mg/dL Final    Comment:    DELTA CHECK NOTED  05/02/2015 07:48 AM 9.46 (H) 0.44 - 1.00 mg/dL Final  05/01/2015 04:26 PM 8.56 (H)  0.44 - 1.00 mg/dL Final  07/30/2014 09:00 AM 8.13 (H) 0.50 - 1.10 mg/dL Final  07/30/2014 05:56 AM 7.89 (H) 0.50 - 1.10 mg/dL Final  07/28/2014 08:00 AM 8.45 (H) 0.50 - 1.10 mg/dL Final  07/27/2014 06:35 AM 6.26 (H) 0.50 - 1.10 mg/dL Final    Comment:    DELTA CHECK NOTED  07/26/2014 08:41 AM 3.55 (H) 0.50 - 1.10 mg/dL Final    Comment:    DELTA CHECK NOTED  07/26/2014 12:47 AM 8.92 (H) 0.50 - 1.10 mg/dL Final  07/25/2014 09:56 PM 8.40 (H) 0.50 - 1.10 mg/dL Final  07/25/2014 09:40 PM 8.34 (H) 0.50 - 1.10 mg/dL Final  01/08/2012 08:56 PM 7.87 (H) 0.50 - 1.10 mg/dL Final  12/19/2010 10:28 AM 7.16 (H) 0.50 - 1.10 mg/dL Final  12/07/2010 11:10 AM 7.86 (H) 0.50 - 1.10 mg/dL Final  11/16/2010 04:16 PM 6.57 (H) 0.50 - 1.10 mg/dL Final    Comment:    **Please note change in reference range.**   Recent Labs  Lab 01/06/19 0629  NA 134*  K 3.4*  CL 89*  CO2 31  GLUCOSE 103*  BUN 26*  CREATININE 5.86*  CALCIUM 9.0   Recent Labs  Lab 01/06/19 0629  WBC 5.7  HGB 7.4*  HCT 25.1*  MCV 102.4*  PLT 234   Liver Function Tests: Recent Labs  Lab 01/06/19 0629  AST 28  ALT 24  ALKPHOS 76  BILITOT 0.7  PROT 6.7  ALBUMIN 3.0*   No results for input(s): LIPASE, AMYLASE in the last 168 hours. No results for input(s): AMMONIA in the last 168 hours. Cardiac Enzymes: No results for input(s): CKTOTAL, CKMB, CKMBINDEX, TROPONINI in the last 168 hours. Iron Studies: No results for input(s): IRON, TIBC, TRANSFERRIN, FERRITIN in the last 72 hours. PT/INR: _0 (inr:5)  Xrays/Other Studies: ) Results for orders placed or performed during the hospital encounter of 01/06/19 (from the past 48 hour(s))  Comprehensive metabolic panel     Status: Abnormal   Collection Time: 01/06/19  6:29 AM  Result Value Ref Range   Sodium 134 (L) 135 - 145 mmol/L   Potassium 3.4 (L) 3.5 - 5.1 mmol/L   Chloride 89 (L) 98 - 111 mmol/L   CO2 31 22 - 32 mmol/L   Glucose, Bld 103 (H) 70 - 99 mg/dL    BUN 26 (H) 6 - 20 mg/dL   Creatinine, Ser 5.86 (H) 0.44 - 1.00 mg/dL   Calcium 9.0 8.9 - 10.3 mg/dL  Total Protein 6.7 6.5 - 8.1 g/dL   Albumin 3.0 (L) 3.5 - 5.0 g/dL   AST 28 15 - 41 U/L   ALT 24 0 - 44 U/L   Alkaline Phosphatase 76 38 - 126 U/L   Total Bilirubin 0.7 0.3 - 1.2 mg/dL   GFR calc non Af Amer 7 (L) >60 mL/min   GFR calc Af Amer 9 (L) >60 mL/min   Anion gap 14 5 - 15    Comment: Performed at [facility] [address] [place], [place] [address]  CBC     Status: Abnormal   Collection Time: 01/06/19  6:29 AM  Result Value Ref Range   WBC 5.7 4.0 - 10.5 K/uL   RBC 2.45 (L) 3.87 - 5.11 MIL/uL   Hemoglobin 7.4 (L) 12.0 - 15.0 g/dL   HCT 25.1 (L) 36.0 - 46.0 %   MCV 102.4 (H) 80.0 - 100.0 fL   MCH 30.2 26.0 - 34.0 pg   MCHC 29.5 (L) 30.0 - 36.0 g/dL   RDW 17.5 (H) 11.5 - 15.5 %   Platelets 234 150 - 400 K/uL   nRBC 0.0 0.0 - 0.2 %    Comment: Performed at [facility], [place] [address], [place], [place] [address]  Protime-INR     Status: Abnormal   Collection Time: 01/06/19  9:33 AM  Result Value Ref Range   Prothrombin Time 19.7 (H) 11.4 - 15.2 seconds   INR 1.7 (H) 0.8 - 1.2    Comment: (NOTE) INR goal varies based on device and disease states. Performed at [facility], [place] [address] [place],  [address]   SARS Coronavirus 2 [facility] order, Performed in [facility] hospital lab) Nasopharyngeal Nasopharyngeal Swab     Status: None   Collection Time: 01/06/19  4:23 PM   Specimen: Nasopharyngeal Swab  Result Value Ref Range   SARS Coronavirus 2 NEGATIVE NEGATIVE    Comment: (NOTE) If result is NEGATIVE SARS-CoV-2 target nucleic acids are NOT DETECTED. The SARS-CoV-2 RNA is generally detectable in upper and lower  respiratory specimens during the acute phase of infection. The lowest  concentration of SARS-CoV-2 viral copies this assay can detect is 250  copies / mL. A negative result does not preclude SARS-CoV-2 infection  and should  not be used as the sole basis for treatment or other  patient management decisions.  A negative result may occur with  improper specimen collection / handling, submission of specimen other  than nasopharyngeal swab, presence of viral mutation(s) within the  areas targeted by this assay, and inadequate number of viral copies  (<250 copies / mL). A negative result must be combined with clinical  observations, patient history, and epidemiological information. If result is POSITIVE SARS-CoV-2 target nucleic acids are DETECTED. The SARS-CoV-2 RNA is generally detectable in upper and lower  respiratory specimens dur ing the acute phase of infection.  Positive  results are indicative of active infection with SARS-CoV-2.  Clinical  correlation with patient history and other diagnostic information is  necessary to determine patient infection status.  Positive results do  not rule out bacterial infection or co-infection with other viruses. If result is PRESUMPTIVE POSTIVE SARS-CoV-2 nucleic acids MAY BE PRESENT.   A presumptive positive result was obtained on the submitted specimen  and confirmed on repeat testing.  While 2019 novel coronavirus  (SARS-CoV-2) nucleic acids may be present in the submitted sample  additional confirmatory testing may be necessary for  epidemiological  and / or clinical management purposes  to differentiate between  SARS-CoV-2 and other Sarbecovirus currently known to infect humans.  If clinically indicated additional testing with an alternate test  methodology [phone]) is advised. The SARS-CoV-2 RNA is generally  detectable in upper and lower respiratory sp ecimens during the acute  phase of infection. The expected result is Negative. Fact Sheet for Patients:  StrictlyIdeas.no Fact Sheet for Healthcare Providers: BankingDealers.co.za This test is not yet approved or cleared by the [place] FDA and has been  authorized for detection and/or diagnosis of SARS-CoV-2 by FDA under an Emergency Use Authorization (EUA).  This EUA will remain in effect (meaning this test can be used) for the duration of the COVID-19 declaration under Section 564(b)(1) of the Act, 21 U.S.C. section 360bbb-3(b)(1), unless the authorization is terminated or revoked sooner. Performed at [facility], [place] [address], [place], [place] [address]    Ct Abdomen Pelvis W Contrast  Result Date: 01/06/2019 CLINICAL DATA:  Umbilical hernia. EXAM: CT ABDOMEN AND PELVIS WITH CONTRAST TECHNIQUE: Multidetector CT imaging of the abdomen and pelvis was performed using the standard protocol following bolus administration of intravenous contrast. CONTRAST:  138m OMNIPAQUE IOHEXOL 300 MG/ML  SOLN COMPARISON:  11/29/2018 and 05/15/2017 FINDINGS: Lower chest: There is atelectasis involving the right middle lobe and right lower lobe with asymmetric elevation of the right hemidiaphragm. A moderate volume left pleural effusion is identified with near complete atelectasis and consolidation of the left lower lobe. Hepatobiliary: No focal liver abnormality is seen. No gallstones, gallbladder wall thickening, or biliary dilatation. Pancreas: Unremarkable. No pancreatic ductal dilatation or surrounding inflammatory changes. Spleen: Numerous calcifications identified throughout the spleen as noted previously. Adrenals/Urinary Tract: Bilateral kidney atrophy with multiple cysts. No hydronephrosis identified bilaterally. The urinary bladder appears decompressed. Stomach/Bowel: Stomach is nondistended. No bowel dilatation, wall thickening, or inflammation. Distal colonic diverticulosis without acute inflammation. Vascular/Lymphatic: Aortic atherosclerosis. Branch vessel disease. No aneurysm. No abdominal no pelvic adenopathy. Reproductive: Partially calcified uterine fibroid.  No adnexal mass. Other: No free fluid or fluid collection. Umbilical hernia is  identified measuring 1.5 x 1.0 cm, image 43/7. This contains fat only. Just below the umbilicus is a ventral midline hernia which contains fat measuring 1.4 x 1.3 cm. There is a small amount of low soft tissue stranding within this hernia which may reflect underlying inflammation. Nodule containing gas within ventral abdominal wall subcutaneous fat just below the level of the umbilicus, eccentric to the right. This measures 1.3 cm, image 56/3. Musculoskeletal: No acute or significant osseous findings. IMPRESSION: 1. Exam positive for small fat containing umbilical hernia measuring 1.5 x 1.0 cm. Just below the umbilicus is a small midline fat containing hernia measuring 1.4 x 1.3 cm. This contains a small amount of soft tissue which may reflect underlying inflammation. 2. Within the subcutaneous fat of the ventral abdominal wall, eccentric to the right of midline near the umbilicus is a small gas containing soft tissue nodule measuring 1.3 cm. 3. Moderate to large left pleural effusion with significantly diminished aeration to the left lower lobe. 4. Atelectasis of the right middle lobe and right lower lobe with asymmetric elevation of right hemidiaphragm. 5.  Aortic Atherosclerosis (ICD10-I70.0). Electronically Signed   By: TKerby MoorsM.D.   On: 01/06/2019 14:58   Dg Chest Portable 1 View  Result Date: 01/06/2019 CLINICAL DATA:  Shortness of breath EXAM: PORTABLE CHEST 1 VIEW COMPARISON:  December 29, 2018 FINDINGS: There is airspace consolidation in both  lung bases with small pleural effusions bilaterally. Patient is status post mitral and aortic valve replacements. Heart is upper normal in size with pulmonary vascularity normal. There is aortic atherosclerosis. No bone lesions. No pneumothorax. IMPRESSION: Cardiomegaly with bilateral pleural effusions. There may be a degree of congestive heart failure. There is airspace consolidation in the bases which probably represents pneumonia, although a degree of  alveolar edema may be present as well in the bases. Patient is status post aortic and mitral valve replacements. Aortic Atherosclerosis (ICD10-I70.0). No evident adenopathy. Electronically Signed   By: Lowella Grip III M.D.   On: 01/06/2019 15:29    PMH:   Past Medical History:  Diagnosis Date  . Anemia   . Aortic stenosis 09/25/2016   Echo 07/24/16: Mod conc LVH, EF 60-65, no RWMA, Gr 2 DDd, bicuspid aortic valve, mild to mod AS (mean 18, peak 38), MAC, mod mitral stenosis (mean 9, peak 19), mild to mod MR, severe LAE, normal RVSF, mild RAE, mild TR  . Arthritis    "joints" (10/23/2017)  . Blood transfusion '08   [facility]; "low HgB" (10/23/2017)  . Chronic diastolic (congestive) heart failure ([place])   . Chronic diastolic CHF (congestive heart failure) ([place])   . Claustrophobia   . Dysfunctional uterine bleeding 12/19/2010  . ESRD (end stage renal disease) on dialysis [facility])    "MWF; [name]." (10/23/2017)  . GERD (gastroesophageal reflux disease)   . Headache   . Heart murmur   . Hemodialysis patient [facility])    right extremity port  . History of hiatal hernia   . Hx of cardiovascular stress test    Lexiscan Myoview 4/16:  Normal stress nuclear study, EF 59%  . Hypertension   . Lupus ([place])    "? kind" (10/23/2017)  . Mitral stenosis    Echo 4/16:  EF 55-60%, no RWMA, Gr 1 DD, mod MS (mean 9 mmHg), mod LAE, mild RAE, PASP 65, mod to severe TR, trivial eff // Echo 6/19:  Mild LVH, EF 55-60, no RWMA, Gr 2 DD, mild to mod AS (Mean 20), severe MS (mean 17), massive LAE, PASP 48, trivial effusion   . Peptic ulcer disease   . Pneumonia 10/21/2017  . PONV (postoperative nausea and vomiting)   . S/P aortic valve replacement with metallic valve 07/04/863   21 mm Sorin [employer]omedics bileaflet mechanical valve  . S/P mitral valve replacement with metallic valve 7/84/6962   31 mm Sorin [employer]omed[employer] Optiform bileaflet mechanical valve  . Stroke [facility])    per patient "they said i had a small stroke but i  couldnt even tell"    PSH:   Past Surgical History:  Procedure Laterality Date  . AORTIC VALVE REPLACEMENT  12/02/2018   AORTIC VALVE REPLACEMENT (AVR) USING CARBOMEDICS SUPRA-ANNULAR TOP HAT SIZE 21MM (N/A)  . AORTIC VALVE REPLACEMENT N/A 12/02/2018   Procedure: AORTIC VALVE REPLACEMENT (AVR) USING CARBOMEDICS SUPRA-ANNULAR TOP HAT SIZE 21MM;  Surgeon: Rexene Alberts, MD;  Location: [place];  Service: Open Heart Surgery;  Laterality: N/A;  . AV FISTULA PLACEMENT Right 02/25/2018   Procedure: INSERTION OF 51m x 16cm ARTEGRAFT;  Surgeon: CWaynetta Sandy MD;  [place]  Service: Vascular;  Laterality: Right;  . COLONOSCOPY W/ BIOPSIES AND POLYPECTOMY    . DIALYSIS FISTULA CREATION  2007  . ENDOMETRIAL ABLATION    . ESOPHAGOGASTRODUODENOSCOPY N/A 07/31/2014   Procedure: ESOPHAGOGASTRODUODENOSCOPY (EGD);  Surgeon: MClarene Essex MD;  [facility]  Service: Endoscopy;  Laterality: N/A;  .  FISTULOGRAM Right 02/25/2018   Procedure: FISTULOGRAM ARM;  Surgeon: Waynetta Sandy, MD;  Location: [place];  Service: Vascular;  Laterality: Right;  . HEMATOMA EVACUATION Right 03/06/2018   Procedure: EVACUATION HEMATOMA RIGHT UPPER ARM;  Surgeon: Waynetta Sandy, MD;  Location: [place];  Service: Vascular;  Laterality: Right;  . INSERTION OF ARTERIOVENOUS (AV) ARTEGRAFT ARM Right 09/26/2017   Procedure: INSERTION OF ARTERIOVENOUS (AV) ARTEGRAFT INTO RIGHT ARM;  Surgeon: Waynetta Sandy, MD;  Location: [place];  Service: Vascular;  Laterality: Right;  . INSERTION OF DIALYSIS CATHETER Right 03/06/2018   Procedure: INSERTION OF DIALYSIS CATHETER, right internal jugular;  Surgeon: Waynetta Sandy, MD;  Location: [place];  Service: Vascular;  Laterality: Right;  . MITRAL VALVE REPLACEMENT N/A 12/02/2018   Procedure: MITRAL VALVE (MV) REPLACEMENT USING CARBOMEDICS OPTIFORM SIZE 31MM;  Surgeon: Rexene Alberts, MD;  Location: [place];  Service: Open Heart Surgery;   Laterality: N/A;  . REVISON OF ARTERIOVENOUS FISTULA Right 09/26/2017   Procedure: REVISION OF ARTERIOVENOUS FISTULA RIGHT ARM WITH ARTEGRAFT;  Surgeon: Waynetta Sandy, MD;  Location: [place];  Service: Vascular;  Laterality: Right;  . REVISON OF ARTERIOVENOUS FISTULA Right 02/25/2018   Procedure: REVISION OF ARTERIOVENOUS FISTULA;  Surgeon: Waynetta Sandy, MD;  Location: [place];  Service: Vascular;  Laterality: Right;  . RIGHT/LEFT HEART CATH AND CORONARY ANGIOGRAPHY N/A 07/24/2018   Procedure: RIGHT/LEFT HEART CATH AND CORONARY ANGIOGRAPHY;  Surgeon: Larey Dresser, MD;  Location: [place] CV LAB;  Service: Cardiovascular;  Laterality: N/A;  . SHOULDER OPEN ROTATOR CUFF REPAIR Right 10/09/2016   Procedure: ROTATOR CUFF REPAIR SHOULDER OPEN partial acrominectomy and extensive synovectomy;  Surgeon: Latanya [name], MD;  Location[facility];  Service: Orthopedics;  Laterality: Right;  RNFA  . TEE WITHOUT CARDIOVERSION N/A 01/02/2018   Procedure: TRANSESOPHAGEAL ECHOCARDIOGRAM (TEE) Bubble Study;  Surgeon: Larey Dresser, MD;  Location: [facility] END[facility]COPY;  Service: Cardiovascular;  Laterality: N/A;  . TEE WITHOUT CARDIOVERSION N/A 12/02/2018   Procedure: TRANSESOPHAGEAL ECHOCARDIOGRAM (TEE);  Surgeon: Rexene Alberts, MD;  Location: [place];  Service: Open Heart Surgery;  Laterality: N/A;  . TUBAL LIGATION      Allergies: No Known Allergies  Medications:   Prior to Admission medications   Medication Sig Start Date End Date Taking? Authorizing Provider  amLODipine (NORVASC) 10 MG tablet Take 1 tablet (10 mg total) by mouth at bedtime. 03/15/18  Yes Roxan Hockey, MD  aspirin EC 81 MG tablet Take 81 mg by mouth at bedtime.   Yes [provider]  calcitRIOL (ROCALTROL) 0.25 MCG capsule Take 7 capsules (1.75 mcg total) by mouth every other day. 10/27/17  Yes Phillips Grout, MD  cloNIDine (CATAPRES) 0.2 MG tablet Take 1 tablet (0.2 mg) by mouth twice daily - on dialysis days  take after dialysis and at bedtime, on other days take one in the morning and one at night Patient taking differently: Take 0.2 mg by mouth See admin instructions. Take 1 tablet (0.2 mg) by mouth twice daily - on dialysis days take after dialysis and at bedtime, on other days take one in the morning and one at night 09/04/18  Yes Fay Records, MD  ferric citrate (AURYXIA) 1 GM 210 MG(Fe) tablet Take 840 mg by mouth 3 (three) times daily with meals.    Yes [provider]  labetalol (NORMODYNE) 300 MG tablet Take 1 tablet (300 mg total) by mouth 2 (two) times daily. 08/29/18  Yes Harrington Challenger,  Carmin Muskrat, MD  multivitamin (RENA-VIT) TABS tablet Take 1 tablet by mouth at bedtime.    Yes [provider]  ondansetron (ZOFRAN) 4 MG tablet Take 4 mg by mouth every 6 (six) hours as needed for nausea or vomiting.   Yes [provider]  pantoprazole (PROTONIX) 40 MG tablet Take 40 mg by mouth 2 (two) times daily.    Yes [provider]  predniSONE (DELTASONE) 10 MG tablet Take 10 mg by mouth daily with breakfast.   Yes [provider]  traMADol (ULTRAM) 50 MG tablet Take 1 tablet (50 mg total) by mouth every 4 (four) hours as needed for moderate pain. 12/11/18  Yes Roddenberry, Arlis Porta, PA-C  warfarin (COUMADIN) 3 MG tablet Take 1 tablet (3 mg total) by mouth daily. Patient taking differently: Take 3-6 mg by mouth See admin instructions. Take 99m Monday,Wednesday,Fridays, then take 1 tablet all other days per patient 12/11/18 12/11/19 Yes Roddenberry, MArlis Porta PA-C    Discontinued Meds:   Medications Discontinued During This Encounter  Medication Reason  . LORazepam (ATIVAN) injection 0.5 mg   . enoxaparin (LOVENOX) 60 MG/0.6ML injection Patient Preference  . cloNIDine (CATAPRES) tablet 0.2 mg   . predniSONE (DELTASONE) tablet 10 mg Completed Course  . ondansetron (ZOFRAN) tablet 4 mg Duplicate    Social History:  reports that she quit smoking about 6 weeks ago. Her  smoking use included cigarettes. She started smoking about 14 months ago. She has a 1.00 pack-year smoking history. She has never used smokeless tobacco. She reports current alcohol use of about 1.0 standard drinks of alcohol per week. She reports current drug use. Drugs: Marijuana and Cocaine.  Family History:   Family History  Problem Relation Age of Onset  . Liver cancer Maternal Grandmother   . Lymphoma Maternal Aunt   . Hypertension Mother   . Renal Disease Father   . Hypertension Father   . Heart attack Neg Hx     Blood pressure (!) 164/90, pulse 97, temperature 98.1 F (36.7 C), resp. rate 17, height _0  (1.676 m), weight 61.3 kg, last menstrual period 12/05/2010, SpO2 99 %. General appearance: alert, cooperative and appears stated age Head: Normocephalic, without obvious abnormality, atraumatic Eyes: negative Neck: no adenopathy, no carotid bruit, supple, symmetrical, trachea midline and thyroid not enlarged, symmetric, no tenderness/mass/nodules Back: symmetric, no curvature. ROM normal. No CVA tenderness. Resp: wheezes dullness to percussion esp on left posterior Chest wall: no tenderness Cardio: regular rate and rhythm and HSM LSB GI: umbilical hernia, mild tenderness but no rebound, nodule at the site of anticoagulation injection Extremities: extremities normal, atraumatic, no cyanosis or edema and BCF + bruit Pulses: 2+ and symmetric Skin: Skin color, texture, turgor normal. No rashes or lesions Lymph nodes: Cervical adenopathy: none       , JHunt Oris MD 01/06/2019, 8:22 PM

## 2019-01-06 NOTE — ED Provider Notes (Signed)
[facility] EMERGENCY DEPARTMENT Provider Note   CSN: [id number] Arrival date & time: 01/06/19  0543    History   Chief Complaint Chief Complaint  Patient presents with   Abdominal Pain    HPI [name] is a 58 y.o. female with history of ESRD on dialysis MWF, aortic stenosis with valve replacements, CHF with chronic supplemental oxygen requirement who presents with 2-day history of abdominal pain around her umbilicus.  She states she believes she has a hernia there and has had to have it pushed back pain before.  She also has some nodules below the area where she has been giving herself Lovenox shots.  Patient denies any fever, chest pain, new shortness of breath, nausea, vomiting, urinary symptoms.     HPI  Past Medical History:  Diagnosis Date   Anemia    Aortic stenosis 09/25/2016   Echo 07/24/16: Mod conc LVH, EF 60-65, no RWMA, Gr 2 DDd, bicuspid aortic valve, mild to mod AS (mean 18, peak 38), MAC, mod mitral stenosis (mean 9, peak 19), mild to mod MR, severe LAE, normal RVSF, mild RAE, mild TR   Arthritis    "joints" (10/23/2017)   Blood transfusion '08   [facility]; "low HgB" (10/23/2017)   Chronic diastolic (congestive) heart failure (HCC)    Chronic diastolic CHF (congestive heart failure) ([place])    Claustrophobia    Dysfunctional uterine bleeding 12/19/2010   ESRD (end stage renal disease) on dialysis ([place])    "MWF; [name]." (10/23/2017)   GERD (gastroesophageal reflux disease)    Headache    Heart murmur    Hemodialysis patient ([place])    right extremity port   History of hiatal hernia    Hx of cardiovascular stress test    Lexiscan Myoview 4/16:  Normal stress nuclear study, EF 59%   Hypertension    Lupus ([place])    "? kind" (10/23/2017)   Mitral stenosis    Echo 4/16:  EF 55-60%, no RWMA, Gr 1 DD, mod MS (mean 9 mmHg), mod LAE, mild RAE, PASP 65, mod to severe TR, trivial eff // Echo 6/19:  Mild LVH, EF 55-60, no RWMA, Gr 2 DD, mild to  mod AS (Mean 20), severe MS (mean 17), massive LAE, PASP 48, trivial effusion    Peptic ulcer disease    Pneumonia 10/21/2017   PONV (postoperative nausea and vomiting)    S/P aortic valve replacement with metallic valve 5/46/5681   21 mm Sorin [employer]omedics bileaflet mechanical valve   S/P mitral valve replacement with metallic valve 2/75/1700   31 mm Sorin Carbomedics Optiform bileaflet mechanical valve   Stroke ([place])    per patient "they said i had a small stroke but i couldnt even tell"    Patient Active Problem List   Diagnosis Date Noted   Pleural effusion 01/06/2019   Encounter for therapeutic drug monitoring 12/16/2018   S/P aortic and mitral valve replacement with mechanical valves 12/02/2018   S/P mitral valve replacement with metallic valve 17/49/4496   Neck pain 10/27/2018   Chronic diastolic (congestive) heart failure (HCC)    Symptomatic anemia 03/14/2018   Hypokalemia 03/14/2018   Chronic pain 03/14/2018   GERD (gastroesophageal reflux disease) 03/14/2018   ESRD needing dialysis ([place])    Volume overload 10/21/2017   HCAP (healthcare-associated pneumonia) 10/21/2017   Acute respiratory failure with hypoxia ([place]) 10/21/2017   Fluid overload 01/26/2017   HTN (hypertension) 01/26/2017   Rotator cuff tear arthropathy, right 10/09/2016  Aortic stenosis 09/25/2016   Left hand pain 07/28/2015   Post-traumatic osteoarthritis of right hand 07/28/2015   Trigger finger of left thumb 07/28/2015   History of sepsis 05/01/2015   Hypoxia 05/01/2015   Mitral stenosis 08/30/2014   End-stage renal disease on hemodialysis ([place]) 07/25/2014   History of pulmonary edema 07/25/2014   Lupus (systemic lupus erythematosus) ([place]) 07/25/2014   DUB (dysfunctional uterine bleeding) 07/25/2014   Anemia 07/25/2014   Peptic ulcer disease 07/25/2014   History of stroke 07/25/2014   Hypertensive heart disease with CHF (congestive heart failure) ([place])  12/19/2010   Dialysis care 12/19/2010    Past Surgical History:  Procedure Laterality Date   AORTIC VALVE REPLACEMENT  12/02/2018   AORTIC VALVE REPLACEMENT (AVR) USING CARBOMEDICS SUPRA-ANNULAR TOP HAT SIZE 21MM (N/A)   AORTIC VALVE REPLACEMENT N/A 12/02/2018   Procedure: AORTIC VALVE REPLACEMENT (AVR) USING CARBOMEDICS SUPRA-ANNULAR TOP HAT SIZE 21MM;  Surgeon: Rexene Alberts, MD;  Location: [place];  Service: Open Heart Surgery;  Laterality: N/A;   AV FISTULA PLACEMENT Right 02/25/2018   Procedure: INSERTION OF 56m x 16cm ARTEGRAFT;  Surgeon: CWaynetta Sandy MD;  [place]  Service: Vascular;  Laterality: Right;   COLONOSCOPY W/ BIOPSIES AND POLYPECTOMY     DIALYSIS FISTULA CREATION  2007   ENDOMETRIAL ABLATION     ESOPHAGOGASTRODUODENOSCOPY N/A 07/31/2014   Procedure: ESOPHAGOGASTRODUODENOSCOPY (EGD);  Surgeon: MClarene Essex MD;  [facility]  Service: Endoscopy;  Laterality: N/A;   FISTULOGRAM Right 02/25/2018   Procedure: FISTULOGRAM ARM;  Surgeon: CWaynetta Sandy MD;  [place] [facility]  Service: Vascular;  Laterality: Right;   HEMATOMA EVACUATION Right 03/06/2018   Procedure: EVACUATION HEMATOMA RIGHT UPPER ARM;  Surgeon: CWaynetta Sandy MD;  [place] [facility]  Service: Vascular;  Laterality: Right;   INSERTION OF ARTERIOVENOUS (AV) ARTEGRAFT ARM Right 09/26/2017   Procedure: INSERTION OF ARTERIOVENOUS (AV) ARTEGRAFT INTO RIGHT ARM;  Surgeon: CWaynetta Sandy MD;  [place] [facility]  Service: Vascular;  Laterality: Right;   INSERTION OF DIALYSIS CATHETER Right 03/06/2018   Procedure: INSERTION OF DIALYSIS CATHETER, right internal jugular;  Surgeon: CWaynetta Sandy MD;  [place] [facility]  Service: Vascular;  Laterality: Right;   MITRAL VALVE REPLACEMENT N/A 12/02/2018   Procedure: MITRAL VALVE (MV) REPLACEMENT USING CARBOMEDICS OPTIFORM SIZE 31MM;  Surgeon: ORexene Alberts MD;  [place] [facility]  Service: Open  Heart Surgery;  Laterality: N/A;   REVISON OF ARTERIOVENOUS FISTULA Right 09/26/2017   Procedure: REVISION OF ARTERIOVENOUS FISTULA RIGHT ARM WITH ARTEGRAFT;  Surgeon: CWaynetta Sandy MD;  [place] [facility]  Service: Vascular;  Laterality: Right;   REVISON OF ARTERIOVENOUS FISTULA Right 02/25/2018   Procedure: REVISION OF ARTERIOVENOUS FISTULA;  Surgeon: CWaynetta Sandy MD;  [place] [facility]  Service: Vascular;  Laterality: Right;   RIGHT/LEFT HEART CATH AND CORONARY ANGIOGRAPHY N/A 07/24/2018   Procedure: RIGHT/LEFT HEART CATH AND CORONARY ANGIOGRAPHY;  Surgeon: MLarey Dresser MD;  [facility];  Service: Cardiovascular;  Laterality: N/A;   SHOULDER OPEN ROTATOR CUFF REPAIR Right 10/09/2016   Procedure: ROTATOR CUFF REPAIR SHOULDER OPEN partial acrominectomy and extensive synovectomy;  Surgeon: GLatanya [name] MD;  [facility];  Service: Orthopedics;  Laterality: Right;  RNFA   TEE WITHOUT CARDIOVERSION N/A 01/02/2018   Procedure: TRANSESOPHAGEAL ECHOCARDIOGRAM (TEE) Bubble Study;  Surgeon: MLarey Dresser MD;  [facility];  Service: Cardiovascular;  Laterality: N/A;   TEE WITHOUT CARDIOVERSION N/A 12/02/2018  Procedure: TRANSESOPHAGEAL ECHOCARDIOGRAM (TEE);  Surgeon: Rexene Alberts, MD;  Location: [facility];  Service: Open Heart Surgery;  Laterality: N/A;   TUBAL LIGATION       OB History   No obstetric history on file.      Home Medications    Prior to Admission medications   Medication Sig Start Date End Date Taking? Authorizing Provider  amLODipine (NORVASC) 10 MG tablet Take 1 tablet (10 mg total) by mouth at bedtime. 03/15/18  Yes Roxan Hockey, MD  aspirin EC 81 MG tablet Take 81 mg by mouth at bedtime.   Yes [provider]  calcitRIOL (ROCALTROL) 0.25 MCG capsule Take 7 capsules (1.75 mcg total) by mouth every other day. 10/27/17  Yes Phillips Grout, MD  cloNIDine (CATAPRES) 0.2 MG tablet Take 1 tablet (0.2 mg)  by mouth twice daily - on dialysis days take after dialysis and at bedtime, on other days take one in the morning and one at night Patient taking differently: Take 0.2 mg by mouth See admin instructions. Take 1 tablet (0.2 mg) by mouth twice daily - on dialysis days take after dialysis and at bedtime, on other days take one in the morning and one at night 09/04/18  Yes Fay Records, MD  ferric citrate (AURYXIA) 1 GM 210 MG(Fe) tablet Take 840 mg by mouth 3 (three) times daily with meals.    Yes [provider]  labetalol (NORMODYNE) 300 MG tablet Take 1 tablet (300 mg total) by mouth 2 (two) times daily. 08/29/18  Yes Fay Records, MD  multivitamin (RENA-VIT) TABS tablet Take 1 tablet by mouth at bedtime.    Yes [provider]  ondansetron (ZOFRAN) 4 MG tablet Take 4 mg by mouth every 6 (six) hours as needed for nausea or vomiting.   Yes [provider]  pantoprazole (PROTONIX) 40 MG tablet Take 40 mg by mouth 2 (two) times daily.    Yes [provider]  predniSONE (DELTASONE) 10 MG tablet Take 10 mg by mouth daily with breakfast.   Yes [provider]  traMADol (ULTRAM) 50 MG tablet Take 1 tablet (50 mg total) by mouth every 4 (four) hours as needed for moderate pain. 12/11/18  Yes Roddenberry, Arlis Porta, PA-C  warfarin (COUMADIN) 3 MG tablet Take 1 tablet (3 mg total) by mouth daily. Patient taking differently: Take 3-6 mg by mouth See admin instructions. Take 4m Monday,Wednesday,Fridays, then take 1 tablet all other days per patient 12/11/18 12/11/19 Yes RAntony Odea PA-C    Family History Family History  Problem Relation Age of Onset   Liver cancer Maternal Grandmother    Lymphoma Maternal Aunt    Hypertension Mother    Renal Disease Father    Hypertension Father    Heart attack Neg Hx     Social History Social History   Tobacco Use   Smoking status: Former Smoker    Packs/day: 0.10    Years: 10.00    Pack years: 1.00     Types: Cigarettes    Start date: 10/21/2017    Quit date: 11/20/2018    Years since quitting: 0.1   Smokeless tobacco: Never Used  Substance Use Topics   Alcohol use: Yes    Alcohol/week: 1.0 standard drinks    Types: 1 Cans of beer per week    Comment: occasional beer   Drug use: Yes    Types: Marijuana, Cocaine    Comment: marijuana 11/27/2018     Allergies  Patient has no known allergies.   Review of Systems Review of Systems  Constitutional: Negative for chills and fever.  HENT: Negative for facial swelling and sore throat.   Respiratory: Negative for shortness of breath.   Cardiovascular: Negative for chest pain.  Gastrointestinal: Positive for abdominal pain. Negative for nausea and vomiting.  Genitourinary: Negative for dysuria.  Musculoskeletal: Negative for back pain.  Skin: Positive for color change. Negative for rash and wound.  Neurological: Negative for headaches.  Psychiatric/Behavioral: The patient is not nervous/anxious.      Physical Exam Updated Vital Signs BP (!) 149/80    Pulse 92    Temp 98.4 F (36.9 C)    Resp 16    Ht _0  (1.676 m)    Wt 61.3 kg    LMP 12/05/2010 (LMP Unknown)    SpO2 100%    BMI 21.81 kg/m   Physical Exam Vitals signs and nursing note reviewed.  Constitutional:      General: She is not in acute distress.    Appearance: She is well-developed. She is not diaphoretic.  HENT:     Head: Normocephalic and atraumatic.     Mouth/Throat:     Pharynx: No oropharyngeal exudate.  Eyes:     General: No scleral icterus.       Right eye: No discharge.        Left eye: No discharge.     Conjunctiva/sclera: Conjunctivae normal.     Pupils: Pupils are equal, round, and reactive to light.  Neck:     Musculoskeletal: Normal range of motion and neck supple.     Thyroid: No thyromegaly.  Cardiovascular:     Rate and Rhythm: Normal rate and regular rhythm.     Heart sounds: Normal heart sounds. No murmur. No friction rub. No gallop.     Pulmonary:     Effort: Pulmonary effort is normal. No respiratory distress.     Breath sounds: Normal breath sounds. No stridor. No wheezing or rales.  Abdominal:     General: Bowel sounds are normal. There is no distension.     Palpations: Abdomen is soft.     Tenderness: There is abdominal tenderness in the periumbilical area. There is no guarding or rebound.     Comments: Palpable, tender mass on the left side the umbilicus measuring about 2 cm; not reducible Ecchymosis with 2 mobile palpable nodules below the umbilicus, also tender  Lymphadenopathy:     Cervical: No cervical adenopathy.  Skin:    General: Skin is warm and dry.     Coloration: Skin is not pale.     Findings: No rash.  Neurological:     Mental Status: She is alert.     Coordination: Coordination normal.      ED Treatments / Results  Labs (all labs ordered are listed, but only abnormal results are displayed) Labs Reviewed  COMPREHENSIVE METABOLIC PANEL - Abnormal; Notable for the following components:      Result Value   Sodium 134 (*)    Potassium 3.4 (*)    Chloride 89 (*)    Glucose, Bld 103 (*)    BUN 26 (*)    Creatinine, Ser 5.86 (*)    Albumin 3.0 (*)    GFR calc non Af Amer 7 (*)    GFR calc Af Amer 9 (*)    All other components within normal limits  CBC - Abnormal; Notable for the following components:   RBC 2.45 (*)  Hemoglobin 7.4 (*)    HCT 25.1 (*)    MCV 102.4 (*)    MCHC 29.5 (*)    RDW 17.5 (*)    All other components within normal limits  PROTIME-INR - Abnormal; Notable for the following components:   Prothrombin Time 19.7 (*)    INR 1.7 (*)    All other components within normal limits  SARS CORONAVIRUS 2 (HOSPITAL ORDER, [place])  URINALYSIS, ROUTINE W REFLEX MICROSCOPIC    EKG None  Radiology Ct Abdomen Pelvis W Contrast  Result Date: 01/06/2019 CLINICAL DATA:  Umbilical hernia. EXAM: CT ABDOMEN AND PELVIS WITH CONTRAST TECHNIQUE:  Multidetector CT imaging of the abdomen and pelvis was performed using the standard protocol following bolus administration of intravenous contrast. CONTRAST:  155m OMNIPAQUE IOHEXOL 300 MG/ML  SOLN COMPARISON:  11/29/2018 and 05/15/2017 FINDINGS: Lower chest: There is atelectasis involving the right middle lobe and right lower lobe with asymmetric elevation of the right hemidiaphragm. A moderate volume left pleural effusion is identified with near complete atelectasis and consolidation of the left lower lobe. Hepatobiliary: No focal liver abnormality is seen. No gallstones, gallbladder wall thickening, or biliary dilatation. Pancreas: Unremarkable. No pancreatic ductal dilatation or surrounding inflammatory changes. Spleen: Numerous calcifications identified throughout the spleen as noted previously. Adrenals/Urinary Tract: Bilateral kidney atrophy with multiple cysts. No hydronephrosis identified bilaterally. The urinary bladder appears decompressed. Stomach/Bowel: Stomach is nondistended. No bowel dilatation, wall thickening, or inflammation. Distal colonic diverticulosis without acute inflammation. Vascular/Lymphatic: Aortic atherosclerosis. Branch vessel disease. No aneurysm. No abdominal no pelvic adenopathy. Reproductive: Partially calcified uterine fibroid.  No adnexal mass. Other: No free fluid or fluid collection. Umbilical hernia is identified measuring 1.5 x 1.0 cm, image 43/7. This contains fat only. Just below the umbilicus is a ventral midline hernia which contains fat measuring 1.4 x 1.3 cm. There is a small amount of low soft tissue stranding within this hernia which may reflect underlying inflammation. Nodule containing gas within ventral abdominal wall subcutaneous fat just below the level of the umbilicus, eccentric to the right. This measures 1.3 cm, image 56/3. Musculoskeletal: No acute or significant osseous findings. IMPRESSION: 1. Exam positive for small fat containing umbilical hernia  measuring 1.5 x 1.0 cm. Just below the umbilicus is a small midline fat containing hernia measuring 1.4 x 1.3 cm. This contains a small amount of soft tissue which may reflect underlying inflammation. 2. Within the subcutaneous fat of the ventral abdominal wall, eccentric to the right of midline near the umbilicus is a small gas containing soft tissue nodule measuring 1.3 cm. 3. Moderate to large left pleural effusion with significantly diminished aeration to the left lower lobe. 4. Atelectasis of the right middle lobe and right lower lobe with asymmetric elevation of right hemidiaphragm. 5.  Aortic Atherosclerosis (ICD10-I70.0). Electronically Signed   By: TKerby MoorsM.D.   On: 01/06/2019 14:58   Dg Chest Portable 1 View  Result Date: 01/06/2019 CLINICAL DATA:  Shortness of breath EXAM: PORTABLE CHEST 1 VIEW COMPARISON:  December 29, 2018 FINDINGS: There is airspace consolidation in both lung bases with small pleural effusions bilaterally. Patient is status post mitral and aortic valve replacements. Heart is upper normal in size with pulmonary vascularity normal. There is aortic atherosclerosis. No bone lesions. No pneumothorax. IMPRESSION: Cardiomegaly with bilateral pleural effusions. There may be a degree of congestive heart failure. There is airspace consolidation in the bases which probably represents pneumonia, although a degree of alveolar edema may be  present as well in the bases. Patient is status post aortic and mitral valve replacements. Aortic Atherosclerosis (ICD10-I70.0). No evident adenopathy. Electronically Signed   By: Lowella Grip III M.D.   On: 01/06/2019 15:29    Procedures Procedures (including critical care time)  Medications Ordered in ED Medications  sodium chloride flush (NS) 0.9 % injection 3 mL (0 mLs Intravenous Hold 01/06/19 0940)  LORazepam (ATIVAN) injection 0.5 mg (has no administration in time range)  LORazepam (ATIVAN) injection 0.5 mg (0.5 mg Intravenous Given  01/06/19 1041)  LORazepam (ATIVAN) injection 0.5 mg (0.5 mg Intravenous Given 01/06/19 1315)  iohexol (OMNIPAQUE) 300 MG/ML solution 100 mL (100 mLs Intravenous Contrast Given 01/06/19 1429)     Initial Impression / Assessment and Plan / ED Course  I have reviewed the triage vital signs and the nursing notes.  Pertinent labs & imaging results that were available during my care of the patient were reviewed by me and considered in my medical decision making (see chart for details).        Patient presenting with pain at the umbilicus.  CT shows small fat-containing hernia as well as a gas containing soft tissue nodule below the umbilicus.  Will refer to outpatient surgery for this.  However, CT shows moderate to large left pleural effusion.  Portable chest x-ray obtained and shows worsening pleural effusion from 8 days ago.  Patient's cardiologist has been watching this since her aortic valve replacement.  Patient has been using more oxygen and having worsening shortness of breath on exertion.  I discussed patient case with Dr. Laren Everts with [facility] who accepts patient for admission.  IR thoracentesis ordered.  I also discussed patient case with cardiology master who advised admitting team can consult as needed.  I appreciate the above consults for assistance with the patient.  Patient also evaluated by my attending, Dr. Johnney Killian, who guided the patient's management and agrees with plan.  Final Clinical Impressions(s) / ED Diagnoses   Final diagnoses:  Pleural effusion  Umbilical hernia without obstruction and without gangrene  Nodule of soft tissue    ED Discharge Orders    None       Frederica Kuster, PA-C 01/06/19 1612    Charlesetta Shanks, MD 01/28/19 1038

## 2019-01-06 NOTE — ED Notes (Signed)
Pt stated that she does not produce urine.

## 2019-01-06 NOTE — ED Notes (Signed)
Patient transported to CT 

## 2019-01-06 NOTE — Telephone Encounter (Signed)
Patients mother called to say patient still in ED w/ hernia. INR 1.7 this AM in ED. Mother wants to know what to take tonight. Advised if patient is not admitted to take 3 tablets tonight. She will need to be resumed on Lovenox, due to subtherapeutic INR. However current unsure what the plan is for admission/ discharge. I have moved patient coumadin appointment to tomorrow. If patient is not going to have surgery she needs to restart her bridge with lovenox

## 2019-01-06 NOTE — H&P (Signed)
[facility]                                                                                    Patient Demographics  [name], is a 59 y.o. female  CSN: [id number]  MRN: [id number]  DOB - 1960-07-17  Admit Date - 01/06/2019  Outpatient Primary MD for the patient is Benito Mccreedy, MD   With History of -  Past Medical History:  Diagnosis Date  . Anemia   . Aortic stenosis 09/25/2016   Echo 07/24/16: Mod conc LVH, EF 60-65, no RWMA, Gr 2 DDd, bicuspid aortic valve, mild to mod AS (mean 18, peak 38), MAC, mod mitral stenosis (mean 9, peak 19), mild to mod MR, severe LAE, normal RVSF, mild RAE, mild TR  . Arthritis    "joints" (10/23/2017)  . Blood transfusion '08   [facility]; "low HgB" (10/23/2017)  . Chronic diastolic (congestive) heart failure ([place])   . Chronic diastolic CHF (congestive heart failure) ([place])   . Claustrophobia   . Dysfunctional uterine bleeding 12/19/2010  . ESRD (end stage renal disease) on dialysis [facility])    "MWF; [name]." (10/23/2017)  . GERD (gastroesophageal reflux disease)   . Headache   . Heart murmur   . Hemodialysis patient [facility])    right extremity port  . History of hiatal hernia   . Hx of cardiovascular stress test    Lexiscan Myoview 4/16:  Normal stress nuclear study, EF 59%  . Hypertension   . Lupus ([place])    "? kind" (10/23/2017)  . Mitral stenosis    Echo 4/16:  EF 55-60%, no RWMA, Gr 1 DD, mod MS (mean 9 mmHg), mod LAE, mild RAE, PASP 65, mod to severe TR, trivial eff // Echo 6/19:  Mild LVH, EF 55-60, no RWMA, Gr 2 DD, mild to mod AS (Mean 20), severe MS (mean 17), massive LAE, PASP 48, trivial effusion   . Peptic ulcer disease   . Pneumonia 10/21/2017  . PONV (postoperative nausea and vomiting)   . S/P aortic valve replacement with metallic valve 3/64/6803   21 mm Sorin [employer]omed[employer] bileaflet mechanical valve  . S/P mitral valve replacement with metallic valve 07/02/2480   31 mm Sorin [employer]omed[employer] Optiform bileaflet mechanical  valve  . Stroke [facility])    per patient "they said i had a small stroke but i couldnt even tell"      Past Surgical History:  Procedure Laterality Date  . AORTIC VALVE REPLACEMENT  12/02/2018   AORTIC VALVE REPLACEMENT (AVR) USING CARBOMEDICS SUPRA-ANNULAR TOP HAT SIZE 21MM (N/A)  . AORTIC VALVE REPLACEMENT N/A 12/02/2018   Procedure: AORTIC VALVE REPLACEMENT (AVR) USING CARBOMEDICS SUPRA-ANNULAR TOP HAT SIZE 21MM;  Surgeon: Rexene Alberts, MD;  Location: [place];  Service: Open Heart Surgery;  Laterality: N/A;  . AV FISTULA PLACEMENT Right 02/25/2018   Procedure: INSERTION OF 50m x 16cm ARTEGRAFT;  Surgeon: CWaynetta Sandy MD;  [place]  Service: Vascular;  Laterality: Right;  . COLONOSCOPY W/ BIOPSIES AND POLYPECTOMY    . DIALYSIS FISTULA CREATION  2007  . ENDOMETRIAL ABLATION    . ESOPHAGOGASTRODUODENOSCOPY N/A 07/31/2014   Procedure: ESOPHAGOGASTRODUODENOSCOPY (  EGD);  Surgeon: Clarene Essex, MD;  Location: [facility] ENDOSCOPY;  Service: Endoscopy;  Laterality: N/A;  . FISTULOGRAM Right 02/25/2018   Procedure: FISTULOGRAM ARM;  Surgeon: Waynetta Sandy, MD;  Location: [place];  Service: Vascular;  Laterality: Right;  . HEMATOMA EVACUATION Right 03/06/2018   Procedure: EVACUATION HEMATOMA RIGHT UPPER ARM;  Surgeon: Waynetta Sandy, MD;  Location: [place];  Service: Vascular;  Laterality: Right;  . INSERTION OF ARTERIOVENOUS (AV) ARTEGRAFT ARM Right 09/26/2017   Procedure: INSERTION OF ARTERIOVENOUS (AV) ARTEGRAFT INTO RIGHT ARM;  Surgeon: Waynetta Sandy, MD;  Location: [place];  Service: Vascular;  Laterality: Right;  . INSERTION OF DIALYSIS CATHETER Right 03/06/2018   Procedure: INSERTION OF DIALYSIS CATHETER, right internal jugular;  Surgeon: Waynetta Sandy, MD;  Location: [place];  Service: Vascular;  Laterality: Right;  . MITRAL VALVE REPLACEMENT N/A 12/02/2018   Procedure: MITRAL VALVE (MV) REPLACEMENT USING CARBOMEDICS OPTIFORM SIZE 31MM;  Surgeon:  Rexene Alberts, MD;  Location: [place];  Service: Open Heart Surgery;  Laterality: N/A;  . REVISON OF ARTERIOVENOUS FISTULA Right 09/26/2017   Procedure: REVISION OF ARTERIOVENOUS FISTULA RIGHT ARM WITH ARTEGRAFT;  Surgeon: Waynetta Sandy, MD;  Location: [place];  Service: Vascular;  Laterality: Right;  . REVISON OF ARTERIOVENOUS FISTULA Right 02/25/2018   Procedure: REVISION OF ARTERIOVENOUS FISTULA;  Surgeon: Waynetta Sandy, MD;  Location: [place];  Service: Vascular;  Laterality: Right;  . RIGHT/LEFT HEART CATH AND CORONARY ANGIOGRAPHY N/A 07/24/2018   Procedure: RIGHT/LEFT HEART CATH AND CORONARY ANGIOGRAPHY;  Surgeon: Larey Dresser, MD;  Location: [place] CV LAB;  Service: Cardiovascular;  Laterality: N/A;  . SHOULDER OPEN ROTATOR CUFF REPAIR Right 10/09/2016   Procedure: ROTATOR CUFF REPAIR SHOULDER OPEN partial acrominectomy and extensive synovectomy;  Surgeon: Latanya [name], MD;  Location: [facility];  Service: Orthopedics;  Laterality: Right;  RNFA  . TEE WITHOUT CARDIOVERSION N/A 01/02/2018   Procedure: TRANSESOPHAGEAL ECHOCARDIOGRAM (TEE) Bubble Study;  Surgeon: Larey Dresser, MD;  Location: [facility] ENDOSCOPY;  Service: Cardiovascular;  Laterality: N/A;  . TEE WITHOUT CARDIOVERSION N/A 12/02/2018   Procedure: TRANSESOPHAGEAL ECHOCARDIOGRAM (TEE);  Surgeon: Rexene Alberts, MD;  Location: [place];  Service: Open Heart Surgery;  Laterality: N/A;  . TUBAL LIGATION      in for   Chief Complaint  Patient presents with  . Abdominal Pain     HPI  [name]  is a 58 y.o. female, with past medical history significant for end stage renal disease on hemodialysis, MWF, aortic stenosis status post aortic valve replacement 11/2018, on chronic home oxygen, presenting with 2 days history of abdominal pain, periumbilical?  Hernia, not associated with nausea, vomiting or diarrhea.  Patient denies fever or chest pains.  Abdominal pain is located at the area of her Lovenox shots with  mild nodules noted at the site. Patient reports being more short of breath and work-up in the emergency room showed worsening pleural effusions especially on the left.  Patient has been followed for her pleural effusions periodically as outpatient.   Review of Systems    In addition to the HPI above,  No Fever-chills, No Headache, No changes with Vision or hearing, No problems swallowing food or Liquids, No Chest pain, Cough or Shortness of Breath, No Abdominal pain, No Nausea or Vommitting, Bowel movements are regular, No Blood in stool or Urine, No dysuria, No new skin rashes or bruises, No new joints pains-aches,  No new weakness, tingling, numbness in  any extremity, No recent weight gain or loss, No polyuria, polydypsia or polyphagia, No significant Mental Stressors.  A full 10 point Review of Systems was done, except as stated above, all other Review of Systems were negative.   Social History Social History   Tobacco Use  . Smoking status: Former Smoker    Packs/day: 0.10    Years: 10.00    Pack years: 1.00    Types: Cigarettes    Start date: 10/21/2017    Quit date: 11/20/2018    Years since quitting: 0.1  . Smokeless tobacco: Never Used  Substance Use Topics  . Alcohol use: Yes    Alcohol/week: 1.0 standard drinks    Types: 1 Cans of beer per week    Comment: occasional beer     Family History Family History  Problem Relation Age of Onset  . Liver cancer Maternal Grandmother   . Lymphoma Maternal Aunt   . Hypertension Mother   . Renal Disease Father   . Hypertension Father   . Heart attack Neg Hx      Prior to Admission medications   Medication Sig Start Date End Date Taking? Authorizing Provider  amLODipine (NORVASC) 10 MG tablet Take 1 tablet (10 mg total) by mouth at bedtime. 03/15/18  Yes Roxan Hockey, MD  aspirin EC 81 MG tablet Take 81 mg by mouth at bedtime.   Yes [provider]  calcitRIOL (ROCALTROL) 0.25 MCG capsule Take 7  capsules (1.75 mcg total) by mouth every other day. 10/27/17  Yes Phillips Grout, MD  cloNIDine (CATAPRES) 0.2 MG tablet Take 1 tablet (0.2 mg) by mouth twice daily - on dialysis days take after dialysis and at bedtime, on other days take one in the morning and one at night Patient taking differently: Take 0.2 mg by mouth See admin instructions. Take 1 tablet (0.2 mg) by mouth twice daily - on dialysis days take after dialysis and at bedtime, on other days take one in the morning and one at night 09/04/18  Yes Fay Records, MD  ferric citrate (AURYXIA) 1 GM 210 MG(Fe) tablet Take 840 mg by mouth 3 (three) times daily with meals.    Yes [provider]  labetalol (NORMODYNE) 300 MG tablet Take 1 tablet (300 mg total) by mouth 2 (two) times daily. 08/29/18  Yes Fay Records, MD  multivitamin (RENA-VIT) TABS tablet Take 1 tablet by mouth at bedtime.    Yes [provider]  ondansetron (ZOFRAN) 4 MG tablet Take 4 mg by mouth every 6 (six) hours as needed for nausea or vomiting.   Yes [provider]  pantoprazole (PROTONIX) 40 MG tablet Take 40 mg by mouth 2 (two) times daily.    Yes [provider]  predniSONE (DELTASONE) 10 MG tablet Take 10 mg by mouth daily with breakfast.   Yes [provider]  traMADol (ULTRAM) 50 MG tablet Take 1 tablet (50 mg total) by mouth every 4 (four) hours as needed for moderate pain. 12/11/18  Yes Roddenberry, Arlis Porta, PA-C  warfarin (COUMADIN) 3 MG tablet Take 1 tablet (3 mg total) by mouth daily. Patient taking differently: Take 3-6 mg by mouth See admin instructions. Take 67m Monday,Wednesday,Fridays, then take 1 tablet all other days per patient 12/11/18 12/11/19 Yes Roddenberry, MArlis Porta PA-C    No Known Allergies  Physical Exam  Vitals  Blood pressure (!) 149/80, pulse 92, temperature 98.4 F (36.9 C), resp. rate 16, height _0  (1.676 m), weight  61.3 kg, last menstrual period 12/05/2010, SpO2 100 %.   General  appearance, chronically ill in moderate shortness of breath HEENT no jaundice or pallor, no facial deviation Neck supple, no neck vein distention Chest decreased breath sounds bilaterally from the middle down Heart normal L8-V5 with systolic murmur/click Abdomen soft, nontender, bowel sounds are present Extremities no clubbing cyanosis or edema Neuro gross nonfocal, patient moving all extremities  Data Review  CBC Recent Labs  Lab 01/06/19 0629  WBC 5.7  HGB 7.4*  HCT 25.1*  PLT 234  MCV 102.4*  MCH 30.2  MCHC 29.5*  RDW 17.5*   ------------------------------------------------------------------------------------------------------------------  Chemistries  Recent Labs  Lab 01/06/19 0629  NA 134*  K 3.4*  CL 89*  CO2 31  GLUCOSE 103*  BUN 26*  CREATININE 5.86*  CALCIUM 9.0  AST 28  ALT 24  ALKPHOS 76  BILITOT 0.7   ------------------------------------------------------------------------------------------------------------------ estimated creatinine clearance is 10 mL/min (A) (by C-G formula based on SCr of 5.86 mg/dL (H)). ------------------------------------------------------------------------------------------------------------------ No results for input(s): TSH, T4TOTAL, T3FREE, THYROIDAB in the last 72 hours.  Invalid input(s): FREET3   Coagulation profile Recent Labs  Lab 01/02/19 1556 01/06/19 0933  INR 2.2 1.7*   ------------------------------------------------------------------------------------------------------------------- No results for input(s): DDIMER in the last 72 hours. -------------------------------------------------------------------------------------------------------------------  Cardiac Enzymes No results for input(s): CKMB, TROPONINI, MYOGLOBIN in the last 168 hours.  Invalid input(s): CK ------------------------------------------------------------------------------------------------------------------ Invalid input(s):  POCBNP   ---------------------------------------------------------------------------------------------------------------  Urinalysis    Component Value Date/Time   COLORURINE YELLOW 05/01/2015 2005   APPEARANCEUR CLEAR 05/01/2015 2005   LABSPEC 1.012 05/01/2015 2005   PHURINE 8.5 (H) 05/01/2015 2005   GLUCOSEU 100 (A) 05/01/2015 2005   HGBUR SMALL (A) 05/01/2015 2005   BILIRUBINUR NEGATIVE 05/01/2015 2005   KETONESUR NEGATIVE 05/01/2015 2005   PROTEINUR 100 (A) 05/01/2015 2005   UROBILINOGEN 0.2 07/27/2014 0150   NITRITE NEGATIVE 05/01/2015 2005   LEUKOCYTESUR NEGATIVE 05/01/2015 2005    ----------------------------------------------------------------------------------------------------------------   Imaging results:   Dg Chest 2 View  Result Date: 12/29/2018 CLINICAL DATA:  Status post mitral valve replacement. Shortness of breath. EXAM: CHEST - 2 VIEW COMPARISON:  Radiographs of December 09, 2018. FINDINGS: Stable cardiomediastinal silhouette. Status post mitral valve repair. No pneumothorax is noted. Mild bibasilar atelectasis is noted with small pleural effusions. Bony thorax is unremarkable. IMPRESSION: Stable mild bibasilar subsegmental atelectasis with small pleural effusions. Aortic Atherosclerosis (ICD10-I70.0). Electronically Signed   By: Marijo Conception M.D.   On: 12/29/2018 14:31   Dg Chest 2 View  Result Date: 12/09/2018 CLINICAL DATA:  Postop open heart surgery.  Pain. EXAM: CHEST - 2 VIEW COMPARISON:  12/08/2018 FINDINGS: Prior median sternotomy and valve repair. Bibasilar opacities, likely atelectasis. Possible small effusions. No pneumothorax. IMPRESSION: Continued bibasilar opacities, slightly improved, likely atelectasis. Suspect small effusions. Electronically Signed   By: Rolm Baptise M.D.   On: 12/09/2018 10:13   Dg Chest 2 View  Result Date: 12/08/2018 CLINICAL DATA:  Postoperative EXAM: CHEST - 2 VIEW COMPARISON:  12/06/2018 FINDINGS: No significant change in  examination. There is a right-sided pigtail chest tube about the anterior pleural space. No significant pneumothorax. Unchanged small bilateral pleural effusions and associated atelectasis or consolidation. Cardiomegaly status post median sternotomy with mitral and aortic valve prostheses. IMPRESSION: No significant change in examination. There is a right-sided pigtail chest tube about the anterior pleural space. No significant pneumothorax. Unchanged small bilateral pleural effusions and associated atelectasis or consolidation. Cardiomegaly status post median sternotomy with mitral and aortic  valve prostheses. Electronically Signed   By: Eddie Candle M.D.   On: 12/08/2018 08:19   Ct Abdomen Pelvis W Contrast  Result Date: 01/06/2019 CLINICAL DATA:  Umbilical hernia. EXAM: CT ABDOMEN AND PELVIS WITH CONTRAST TECHNIQUE: Multidetector CT imaging of the abdomen and pelvis was performed using the standard protocol following bolus administration of intravenous contrast. CONTRAST:  19m OMNIPAQUE IOHEXOL 300 MG/ML  SOLN COMPARISON:  11/29/2018 and 05/15/2017 FINDINGS: Lower chest: There is atelectasis involving the right middle lobe and right lower lobe with asymmetric elevation of the right hemidiaphragm. A moderate volume left pleural effusion is identified with near complete atelectasis and consolidation of the left lower lobe. Hepatobiliary: No focal liver abnormality is seen. No gallstones, gallbladder wall thickening, or biliary dilatation. Pancreas: Unremarkable. No pancreatic ductal dilatation or surrounding inflammatory changes. Spleen: Numerous calcifications identified throughout the spleen as noted previously. Adrenals/Urinary Tract: Bilateral kidney atrophy with multiple cysts. No hydronephrosis identified bilaterally. The urinary bladder appears decompressed. Stomach/Bowel: Stomach is nondistended. No bowel dilatation, wall thickening, or inflammation. Distal colonic diverticulosis without acute  inflammation. Vascular/Lymphatic: Aortic atherosclerosis. Branch vessel disease. No aneurysm. No abdominal no pelvic adenopathy. Reproductive: Partially calcified uterine fibroid.  No adnexal mass. Other: No free fluid or fluid collection. Umbilical hernia is identified measuring 1.5 x 1.0 cm, image 43/7. This contains fat only. Just below the umbilicus is a ventral midline hernia which contains fat measuring 1.4 x 1.3 cm. There is a small amount of low soft tissue stranding within this hernia which may reflect underlying inflammation. Nodule containing gas within ventral abdominal wall subcutaneous fat just below the level of the umbilicus, eccentric to the right. This measures 1.3 cm, image 56/3. Musculoskeletal: No acute or significant osseous findings. IMPRESSION: 1. Exam positive for small fat containing umbilical hernia measuring 1.5 x 1.0 cm. Just below the umbilicus is a small midline fat containing hernia measuring 1.4 x 1.3 cm. This contains a small amount of soft tissue which may reflect underlying inflammation. 2. Within the subcutaneous fat of the ventral abdominal wall, eccentric to the right of midline near the umbilicus is a small gas containing soft tissue nodule measuring 1.3 cm. 3. Moderate to large left pleural effusion with significantly diminished aeration to the left lower lobe. 4. Atelectasis of the right middle lobe and right lower lobe with asymmetric elevation of right hemidiaphragm. 5.  Aortic Atherosclerosis (ICD10-I70.0). Electronically Signed   By: TKerby MoorsM.D.   On: 01/06/2019 14:58   Dg Chest Portable 1 View  Result Date: 01/06/2019 CLINICAL DATA:  Shortness of breath EXAM: PORTABLE CHEST 1 VIEW COMPARISON:  December 29, 2018 FINDINGS: There is airspace consolidation in both lung bases with small pleural effusions bilaterally. Patient is status post mitral and aortic valve replacements. Heart is upper normal in size with pulmonary vascularity normal. There is aortic  atherosclerosis. No bone lesions. No pneumothorax. IMPRESSION: Cardiomegaly with bilateral pleural effusions. There may be a degree of congestive heart failure. There is airspace consolidation in the bases which probably represents pneumonia, although a degree of alveolar edema may be present as well in the bases. Patient is status post aortic and mitral valve replacements. Aortic Atherosclerosis (ICD10-I70.0). No evident adenopathy. Electronically Signed   By: WLowella GripIII M.D.   On: 01/06/2019 15:29   Dg Chest Port 1 View  Result Date: 12/08/2018 CLINICAL DATA:  Status post right chest tube removal today. EXAM: PORTABLE CHEST 1 VIEW COMPARISON:  PA and lateral chest 12/08/2018. FINDINGS: Pigtail catheter in  the right chest is been removed. No pneumothorax. Small bilateral pleural effusions and basilar airspace disease are unchanged. The patient is status post aortic and mitral valve repair. IMPRESSION: Negative for pneumothorax with a right chest tube removal. No change in small bilateral pleural effusions and basilar atelectasis. Electronically Signed   By: Inge Rise M.D.   On: 12/08/2018 11:39      Assessment & Plan  Bilateral pleural effusions left > than right Consult IR for thoracentesis on the left ,first Full liquid diet  Abdominal pain with probable reducible hernia, periumbilical Can follow-up as outpatient  Status post aortic valve replacement for aortic stenosis Hold Coumadin for thoracentesis  ESRD On hemodialysis MWF  History of SLE Continue on steroids  Hypertension Continue with Norvasc and clonidine   chronic diastolic congestive heart failure Seems to be compensated  DVT Prophylaxis Coumadin  AM Labs Ordered, also please review Full Orders  Family Communication: .  Code Status full  Disposition Plan: Home  Time spent in minutes : 46 minutes  Condition GUARDED   _0 @

## 2019-01-07 ENCOUNTER — Encounter (HOSPITAL_COMMUNITY): Payer: Self-pay | Admitting: Student

## 2019-01-07 ENCOUNTER — Observation Stay (HOSPITAL_COMMUNITY): Payer: Medicare Other

## 2019-01-07 DIAGNOSIS — I132 Hypertensive heart and chronic kidney disease with heart failure and with stage 5 chronic kidney disease, or end stage renal disease: Secondary | ICD-10-CM | POA: Diagnosis not present

## 2019-01-07 DIAGNOSIS — J9811 Atelectasis: Secondary | ICD-10-CM | POA: Diagnosis not present

## 2019-01-07 DIAGNOSIS — J948 Other specified pleural conditions: Secondary | ICD-10-CM | POA: Diagnosis not present

## 2019-01-07 DIAGNOSIS — J90 Pleural effusion, not elsewhere classified: Secondary | ICD-10-CM | POA: Diagnosis not present

## 2019-01-07 DIAGNOSIS — E871 Hypo-osmolality and hyponatremia: Secondary | ICD-10-CM | POA: Diagnosis present

## 2019-01-07 DIAGNOSIS — I509 Heart failure, unspecified: Secondary | ICD-10-CM | POA: Diagnosis not present

## 2019-01-07 DIAGNOSIS — I5032 Chronic diastolic (congestive) heart failure: Secondary | ICD-10-CM | POA: Diagnosis present

## 2019-01-07 DIAGNOSIS — Z992 Dependence on renal dialysis: Secondary | ICD-10-CM | POA: Diagnosis not present

## 2019-01-07 DIAGNOSIS — Z952 Presence of prosthetic heart valve: Secondary | ICD-10-CM | POA: Diagnosis not present

## 2019-01-07 DIAGNOSIS — R0902 Hypoxemia: Secondary | ICD-10-CM | POA: Diagnosis not present

## 2019-01-07 DIAGNOSIS — M329 Systemic lupus erythematosus, unspecified: Secondary | ICD-10-CM | POA: Diagnosis present

## 2019-01-07 DIAGNOSIS — F1490 Cocaine use, unspecified, uncomplicated: Secondary | ICD-10-CM | POA: Diagnosis present

## 2019-01-07 DIAGNOSIS — Z8249 Family history of ischemic heart disease and other diseases of the circulatory system: Secondary | ICD-10-CM | POA: Diagnosis not present

## 2019-01-07 DIAGNOSIS — Z7901 Long term (current) use of anticoagulants: Secondary | ICD-10-CM | POA: Diagnosis not present

## 2019-01-07 DIAGNOSIS — K429 Umbilical hernia without obstruction or gangrene: Secondary | ICD-10-CM | POA: Diagnosis present

## 2019-01-07 DIAGNOSIS — G8929 Other chronic pain: Secondary | ICD-10-CM | POA: Diagnosis present

## 2019-01-07 DIAGNOSIS — Z8673 Personal history of transient ischemic attack (TIA), and cerebral infarction without residual deficits: Secondary | ICD-10-CM | POA: Diagnosis not present

## 2019-01-07 DIAGNOSIS — N186 End stage renal disease: Secondary | ICD-10-CM | POA: Diagnosis not present

## 2019-01-07 DIAGNOSIS — R229 Localized swelling, mass and lump, unspecified: Secondary | ICD-10-CM | POA: Diagnosis present

## 2019-01-07 DIAGNOSIS — F1290 Cannabis use, unspecified, uncomplicated: Secondary | ICD-10-CM | POA: Diagnosis present

## 2019-01-07 DIAGNOSIS — J869 Pyothorax without fistula: Secondary | ICD-10-CM | POA: Diagnosis not present

## 2019-01-07 DIAGNOSIS — Z720 Tobacco use: Secondary | ICD-10-CM | POA: Diagnosis not present

## 2019-01-07 DIAGNOSIS — N2581 Secondary hyperparathyroidism of renal origin: Secondary | ICD-10-CM | POA: Diagnosis present

## 2019-01-07 DIAGNOSIS — F40240 Claustrophobia: Secondary | ICD-10-CM | POA: Diagnosis present

## 2019-01-07 DIAGNOSIS — K219 Gastro-esophageal reflux disease without esophagitis: Secondary | ICD-10-CM | POA: Diagnosis present

## 2019-01-07 DIAGNOSIS — Z20828 Contact with and (suspected) exposure to other viral communicable diseases: Secondary | ICD-10-CM | POA: Diagnosis present

## 2019-01-07 DIAGNOSIS — F17210 Nicotine dependence, cigarettes, uncomplicated: Secondary | ICD-10-CM | POA: Diagnosis present

## 2019-01-07 DIAGNOSIS — D631 Anemia in chronic kidney disease: Secondary | ICD-10-CM | POA: Diagnosis present

## 2019-01-07 HISTORY — PX: IR THORACENTESIS ASP PLEURAL SPACE W/IMG GUIDE: IMG5380

## 2019-01-07 LAB — GLUCOSE, PLEURAL OR PERITONEAL FLUID: Glucose, Fluid: 59 mg/dL

## 2019-01-07 LAB — PROTIME-INR
INR: 1.9 — ABNORMAL HIGH (ref 0.8–1.2)
Prothrombin Time: 21.2 seconds — ABNORMAL HIGH (ref 11.4–15.2)

## 2019-01-07 LAB — RENAL FUNCTION PANEL
Albumin: 3 g/dL — ABNORMAL LOW (ref 3.5–5.0)
Anion gap: 15 (ref 5–15)
BUN: 35 mg/dL — ABNORMAL HIGH (ref 6–20)
CO2: 28 mmol/L (ref 22–32)
Calcium: 9.1 mg/dL (ref 8.9–10.3)
Chloride: 87 mmol/L — ABNORMAL LOW (ref 98–111)
Creatinine, Ser: 7.55 mg/dL — ABNORMAL HIGH (ref 0.44–1.00)
GFR calc Af Amer: 6 mL/min — ABNORMAL LOW (ref >60)
GFR calc non Af Amer: 5 mL/min — ABNORMAL LOW (ref >60)
Glucose, Bld: 89 mg/dL (ref 70–99)
Phosphorus: 6.4 mg/dL — ABNORMAL HIGH (ref 2.5–4.6)
Potassium: 3.6 mmol/L (ref 3.5–5.1)
Sodium: 130 mmol/L — ABNORMAL LOW (ref 135–145)

## 2019-01-07 LAB — GRAM STAIN

## 2019-01-07 LAB — PROTEIN, PLEURAL OR PERITONEAL FLUID: Total protein, fluid: 4.4 g/dL

## 2019-01-07 LAB — HIV ANTIBODY (ROUTINE TESTING W REFLEX): HIV Screen 4th Generation wRfx: NONREACTIVE

## 2019-01-07 LAB — BODY FLUID CELL COUNT WITH DIFFERENTIAL
Eos, Fluid: 0 %
Lymphs, Fluid: 14 %
Monocyte-Macrophage-Serous Fluid: 14 % — ABNORMAL LOW (ref 50–90)
Neutrophil Count, Fluid: 72 % — ABNORMAL HIGH (ref 0–25)
Total Nucleated Cell Count, Fluid: 1775 cu mm — ABNORMAL HIGH (ref 0–1000)

## 2019-01-07 LAB — CBC
HCT: 23.1 % — ABNORMAL LOW (ref 36.0–46.0)
Hemoglobin: 7.2 g/dL — ABNORMAL LOW (ref 12.0–15.0)
MCH: 31.2 pg (ref 26.0–34.0)
MCHC: 31.2 g/dL (ref 30.0–36.0)
MCV: 100 fL (ref 80.0–100.0)
Platelets: 233 10*3/uL (ref 150–400)
RBC: 2.31 MIL/uL — ABNORMAL LOW (ref 3.87–5.11)
RDW: 17.2 % — ABNORMAL HIGH (ref 11.5–15.5)
WBC: 5.5 10*3/uL (ref 4.0–10.5)
nRBC: 0 % (ref 0.0–0.2)

## 2019-01-07 LAB — LACTATE DEHYDROGENASE, PLEURAL OR PERITONEAL FLUID: LD, Fluid: 171 U/L — ABNORMAL HIGH (ref 3–23)

## 2019-01-07 MED ORDER — HEPARIN SODIUM (PORCINE) 1000 UNIT/ML DIALYSIS
1000.0000 [IU] | INTRAMUSCULAR | Status: DC | PRN
  Filled 2019-01-07: qty 1

## 2019-01-07 MED ORDER — SODIUM CHLORIDE 0.9 % IV SOLN: 100.0000 mL | INTRAVENOUS | Status: DC | PRN

## 2019-01-07 MED ORDER — ALTEPLASE 2 MG IJ SOLR
2.0000 mg | Freq: Once | INTRAMUSCULAR | Status: DC | PRN
  Filled 2019-01-07: qty 2

## 2019-01-07 MED ORDER — TRAMADOL HCL 50 MG PO TABS: 50.0000 mg | ORAL_TABLET | Freq: Four times a day (QID) | ORAL | Status: DC | PRN

## 2019-01-07 MED ORDER — LIDOCAINE HCL (PF) 1 % IJ SOLN
5.0000 mL | INTRAMUSCULAR | Status: DC | PRN
  Filled 2019-01-07: qty 5

## 2019-01-07 MED ORDER — LIDOCAINE HCL 1 % IJ SOLN
INTRAMUSCULAR | Status: AC
  Filled 2019-01-07: qty 20

## 2019-01-07 MED ORDER — PENTAFLUOROPROP-TETRAFLUOROETH EX AERO: 1.0000 "application " | INHALATION_SPRAY | CUTANEOUS | Status: DC | PRN

## 2019-01-07 MED ORDER — HEPARIN (PORCINE) 25000 UT/250ML-% IV SOLN
900.0000 [IU]/h | INTRAVENOUS | Status: DC
  Administered 2019-01-07: 600 [IU]/h via INTRAVENOUS
  Filled 2019-01-07 (×2): qty 250

## 2019-01-07 MED ORDER — LIDOCAINE-PRILOCAINE 2.5-2.5 % EX CREA
1.0000 "application " | TOPICAL_CREAM | CUTANEOUS | Status: DC | PRN
  Filled 2019-01-07: qty 5

## 2019-01-07 NOTE — Progress Notes (Signed)
[facility] Progress Note  Subjective: no c/o, seen on HD  Vitals:   01/07/19 1027 01/07/19 1045 01/07/19 1050 01/07/19 1124  BP: 122/71 (!) 145/86 (!) 144/85 137/76  Pulse: 100 69 100 (!) 101  Resp: 18  18 18   Temp:   98.7 F (37.1 C)   TempSrc:   Oral   SpO2:    100%  Weight:    62.2 kg  Height:        Inpatient medications: . amLODipine  10 mg Oral QHS  . calcitRIOL  1.75 mcg Oral QODAY  . Chlorhexidine Gluconate Cloth  6 each Topical Q0600  . cloNIDine  0.2 mg Oral BID  . ferric citrate  840 mg Oral TID WC  . labetalol  300 mg Oral BID  . LORazepam  0.5 mg Intravenous Once  . multivitamin  1 tablet Oral QHS  . pantoprazole  40 mg Oral BID  . sodium chloride flush  3 mL Intravenous Once  . sodium chloride flush  3 mL Intravenous Q12H   . sodium chloride     sodium chloride, acetaminophen **OR** acetaminophen, HYDROcodone-acetaminophen, ondansetron **OR** ondansetron (ZOFRAN) IV, sodium chloride flush, traMADol    Exam:  alert, no distress, chron ill appearing  no jvd  Chest dec'd BS L base > R base  Cor no MRG  Abd soft ntnd  Ext no LE edema   AVF +bruit    Dialysis: [facility] MWF  4h  60.5kg  2/2 bath  P2  AVF  Hep 1800  mircera 225 q 2wks last 8/5  sensipar 120 tiw   Assessment/Plan: 1. ESRD - MWF HD. HD this am. 2. Large left sided pleural effusion - pending thoracentesis procedure 3. S/p AVR/MVR on 12/02/2018: on anticoagulation. 4. SLE - on steroids 5. HTN - continue home regimen + UF w/ HD today 6. Umbilical hernia - not incarcerated and reducible but there is some possibly underlying inflammation by CT.    [facility] 01/07/2019, 1:50 PM  Iron/TIBC/Ferritin/ %Sat    Component Value Date/Time   IRON 16 (L) 07/26/2014 0841   TIBC 167 (L) 07/26/2014 0841   FERRITIN 3,294 (H) 07/26/2014 0841   IRONPCTSAT 10 (L) 07/26/2014 0841   Recent Labs  Lab 01/07/19 0650 01/07/19 0931  NA 130*  --   K 3.6  --   CL  87*  --   CO2 28  --   GLUCOSE 89  --   BUN 35*  --   CREATININE 7.55*  --   CALCIUM 9.1  --   PHOS 6.4*  --   ALBUMIN 3.0*  --   INR  --  1.9*   Recent Labs  Lab 01/06/19 0629  AST 28  ALT 24  ALKPHOS 76  BILITOT 0.7  PROT 6.7   Recent Labs  Lab 01/07/19 0650  WBC 5.5  HGB 7.2*  HCT 23.1*  PLT 233

## 2019-01-07 NOTE — Progress Notes (Addendum)
[facility] PROGRESS NOTE  Monseratt Ledin WCH:[id number] DOB: November 12, 1960 DOA: 01/06/2019 PCP: Benito Mccreedy, MD  Assessment/Plan:  Bilateral pleural effusions left > than right. Patient presented with worsening dyspnea. CT reveals moderate to large left pleural effusion with significantly diminished aeration to LLL as well as atelectasis of the right middle lobe and right lower lobe. Chest xray with ? chf revealed this is worsening. Chart review indicates cardiology has been monitoring since valve replacements last month. Of note she was discharged with home oxygen.  Oxygen saturation level greater than 90% on 2L. She  has not missed any dialysis sessions. Spoke with thoracic surgery who recommend thoracentesis both sides and state they will follow.  -awaiting IR  for thoracentesis on the left ,first with labs -NPO until evaluated by IR -appreciate thoracic surgery assistance -thoracentesis scheduled for 3pm. Left message on machine she will need both side  Abdominal pain with probable reducible hernia, periumbilical Can follow-up as outpatient  Status post aortic valve replacement for aortic stenosis complicated by pneumothorax requiring chest tube. She was discharged on home oxygen.  Home meds include coumadin. INR 1.7.  -Holding Coumadin for thoracentesis -coumadin per pharmacy  ESRD. Dialysis this am.  On hemodialysis MWF  History of SLE Continue on steroids  Hypertension. Fair control. Home meds include amlodipine, clonidine, labetalol,  -continue home meds except clonidine   chronic diastolic congestive heart failure Seems to be compensated   Code Status: full Family Communication: patient   Disposition Plan: home when ready   Consultants:  Dr Augustin Coupe nephrology  Procedures:  Dialysis 01/07/19  Antibiotics:    HPI/Subjective: In dialysis complains of hunger and requesting food.   Objective: Vitals:   01/07/19 0930 01/07/19 1000  BP: 136/76 (P)  128/76  Pulse: 100 (!) (P) 101  Resp: 18 (P) 20  Temp:    SpO2:     No intake or output data in the 24 hours ending 01/07/19 1009 Filed Weights   01/06/19 0956 01/07/19 0717  Weight: 61.3 kg 61.9 kg    Exam:   General:  Awake alert in dialysis. No acute distress  Cardiovascular: rrr +click no LE edema  Respiratory: mild increase work of breathing BS distant particuarly on left fine crackles noted on right  Abdomen: non distended +BS no guarding or rebounding  Musculoskeletal: joints without swelling/erythema   Data Reviewed: Basic Metabolic Panel: Recent Labs  Lab 01/06/19 0629 01/07/19 0650  NA 134* 130*  K 3.4* 3.6  CL 89* 87*  CO2 31 28  GLUCOSE 103* 89  BUN 26* 35*  CREATININE 5.86* 7.55*  CALCIUM 9.0 9.1  PHOS  --  6.4*   Liver Function Tests: Recent Labs  Lab 01/06/19 0629 01/07/19 0650  AST 28  --   ALT 24  --   ALKPHOS 76  --   BILITOT 0.7  --   PROT 6.7  --   ALBUMIN 3.0* 3.0*   No results for input(s): LIPASE, AMYLASE in the last 168 hours. No results for input(s): AMMONIA in the last 168 hours. CBC: Recent Labs  Lab 01/06/19 0629 01/07/19 0650  WBC 5.7 5.5  HGB 7.4* 7.2*  HCT 25.1* 23.1*  MCV 102.4* 100.0  PLT 234 233   Cardiac Enzymes: No results for input(s): CKTOTAL, CKMB, CKMBINDEX, TROPONINI in the last 168 hours. BNP (last 3 results) No results for input(s): BNP in the last 8760 hours.  ProBNP (last 3 results) No results for input(s): PROBNP in the last 8760 hours.  CBG:  No results for input(s): GLUCAP in the last 168 hours.  Recent Results (from the past 240 hour(s))  SARS Coronavirus 2 [facility] order, Performed in [facility] hospital lab) Nasopharyngeal Nasopharyngeal Swab     Status: None   Collection Time: 01/06/19  4:23 PM   Specimen: Nasopharyngeal Swab  Result Value Ref Range Status   SARS Coronavirus 2 NEGATIVE NEGATIVE Final    Comment: (NOTE) If result is NEGATIVE SARS-CoV-2 target nucleic acids are NOT  DETECTED. The SARS-CoV-2 RNA is generally detectable in upper and lower  respiratory specimens during the acute phase of infection. The lowest  concentration of SARS-CoV-2 viral copies this assay can detect is 250  copies / mL. A negative result does not preclude SARS-CoV-2 infection  and should not be used as the sole basis for treatment or other  patient management decisions.  A negative result may occur with  improper specimen collection / handling, submission of specimen other  than nasopharyngeal swab, presence of viral mutation(s) within the  areas targeted by this assay, and inadequate number of viral copies  (<250 copies / mL). A negative result must be combined with clinical  observations, patient history, and epidemiological information. If result is POSITIVE SARS-CoV-2 target nucleic acids are DETECTED. The SARS-CoV-2 RNA is generally detectable in upper and lower  respiratory specimens dur ing the acute phase of infection.  Positive  results are indicative of active infection with SARS-CoV-2.  Clinical  correlation with patient history and other diagnostic information is  necessary to determine patient infection status.  Positive results do  not rule out bacterial infection or co-infection with other viruses. If result is PRESUMPTIVE POSTIVE SARS-CoV-2 nucleic acids MAY BE PRESENT.   A presumptive positive result was obtained on the submitted specimen  and confirmed on repeat testing.  While 2019 novel coronavirus  (SARS-CoV-2) nucleic acids may be present in the submitted sample  additional confirmatory testing may be necessary for epidemiological  and / or clinical management purposes  to differentiate between  SARS-CoV-2 and other Sarbecovirus currently known to infect humans.  If clinically indicated additional testing with an alternate test  methodology [phone]) is advised. The SARS-CoV-2 RNA is generally  detectable in upper and lower respiratory sp ecimens during  the acute  phase of infection. The expected result is Negative. Fact Sheet for Patients:  StrictlyIdeas.no Fact Sheet for Healthcare Providers: BankingDealers.co.za This test is not yet approved or cleared by the [place] FDA and has been authorized for detection and/or diagnosis of SARS-CoV-2 by FDA under an Emergency Use Authorization (EUA).  This EUA will remain in effect (meaning this test can be used) for the duration of the COVID-19 declaration under Section 564(b)(1) of the Act, 21 U.S.C. section 360bbb-3(b)(1), unless the authorization is terminated or revoked sooner. Performed at [facility], [place] [address] [place], [place] [address]      Studies: Ct Abdomen Pelvis W Contrast  Result Date: 01/06/2019 CLINICAL DATA:  Umbilical hernia. EXAM: CT ABDOMEN AND PELVIS WITH CONTRAST TECHNIQUE: Multidetector CT imaging of the abdomen and pelvis was performed using the standard protocol following bolus administration of intravenous contrast. CONTRAST:  146mL OMNIPAQUE IOHEXOL 300 MG/ML  SOLN COMPARISON:  11/29/2018 and 05/15/2017 FINDINGS: Lower chest: There is atelectasis involving the right middle lobe and right lower lobe with asymmetric elevation of the right hemidiaphragm. A moderate volume left pleural effusion is identified with near complete atelectasis and consolidation of the left lower lobe. Hepatobiliary: No focal liver abnormality is  seen. No gallstones, gallbladder wall thickening, or biliary dilatation. Pancreas: Unremarkable. No pancreatic ductal dilatation or surrounding inflammatory changes. Spleen: Numerous calcifications identified throughout the spleen as noted previously. Adrenals/Urinary Tract: Bilateral kidney atrophy with multiple cysts. No hydronephrosis identified bilaterally. The urinary bladder appears decompressed. Stomach/Bowel: Stomach is nondistended. No bowel dilatation, wall thickening, or inflammation.  Distal colonic diverticulosis without acute inflammation. Vascular/Lymphatic: Aortic atherosclerosis. Branch vessel disease. No aneurysm. No abdominal no pelvic adenopathy. Reproductive: Partially calcified uterine fibroid.  No adnexal mass. Other: No free fluid or fluid collection. Umbilical hernia is identified measuring 1.5 x 1.0 cm, image 43/7. This contains fat only. Just below the umbilicus is a ventral midline hernia which contains fat measuring 1.4 x 1.3 cm. There is a small amount of low soft tissue stranding within this hernia which may reflect underlying inflammation. Nodule containing gas within ventral abdominal wall subcutaneous fat just below the level of the umbilicus, eccentric to the right. This measures 1.3 cm, image 56/3. Musculoskeletal: No acute or significant osseous findings. IMPRESSION: 1. Exam positive for small fat containing umbilical hernia measuring 1.5 x 1.0 cm. Just below the umbilicus is a small midline fat containing hernia measuring 1.4 x 1.3 cm. This contains a small amount of soft tissue which may reflect underlying inflammation. 2. Within the subcutaneous fat of the ventral abdominal wall, eccentric to the right of midline near the umbilicus is a small gas containing soft tissue nodule measuring 1.3 cm. 3. Moderate to large left pleural effusion with significantly diminished aeration to the left lower lobe. 4. Atelectasis of the right middle lobe and right lower lobe with asymmetric elevation of right hemidiaphragm. 5.  Aortic Atherosclerosis (ICD10-I70.0). Electronically Signed   By: Kerby Moors M.D.   On: 01/06/2019 14:58   Dg Chest Portable 1 View  Result Date: 01/06/2019 CLINICAL DATA:  Shortness of breath EXAM: PORTABLE CHEST 1 VIEW COMPARISON:  December 29, 2018 FINDINGS: There is airspace consolidation in both lung bases with small pleural effusions bilaterally. Patient is status post mitral and aortic valve replacements. Heart is upper normal in size with pulmonary  vascularity normal. There is aortic atherosclerosis. No bone lesions. No pneumothorax. IMPRESSION: Cardiomegaly with bilateral pleural effusions. There may be a degree of congestive heart failure. There is airspace consolidation in the bases which probably represents pneumonia, although a degree of alveolar edema may be present as well in the bases. Patient is status post aortic and mitral valve replacements. Aortic Atherosclerosis (ICD10-I70.0). No evident adenopathy. Electronically Signed   By: Lowella Grip III M.D.   On: 01/06/2019 15:29    Scheduled Meds: . amLODipine  10 mg Oral QHS  . calcitRIOL  1.75 mcg Oral QODAY  . Chlorhexidine Gluconate Cloth  6 each Topical Q0600  . cloNIDine  0.2 mg Oral BID  . ferric citrate  840 mg Oral TID WC  . labetalol  300 mg Oral BID  . LORazepam  0.5 mg Intravenous Once  . multivitamin  1 tablet Oral QHS  . pantoprazole  40 mg Oral BID  . sodium chloride flush  3 mL Intravenous Once  . sodium chloride flush  3 mL Intravenous Q12H   Continuous Infusions: . sodium chloride    . sodium chloride    . sodium chloride      Principal Problem:   Pleural effusion Active Problems:   ESRD needing dialysis ([place])   S/P aortic and mitral valve replacement with mechanical valves   Hypertensive heart disease with CHF (congestive heart  failure) (HCC)   Lupus (systemic lupus erythematosus) (HCC)   Chronic pain   Chronic diastolic (congestive) heart failure ([place])    Time spent: 40 minutes    [place] NP  [facility]  If 7PM-7AM, please contact night-coverage at [email or web address], password [facility] 01/07/2019, 10:09 AM  LOS: 0 days

## 2019-01-07 NOTE — Progress Notes (Signed)
ANTICOAGULATION CONSULT NOTE - Initial Consult  Pharmacy Consult for Heparin  Indication:  H/o recent s/p   No Known Allergies  Patient Measurements: Height: _0  (167.6 cm) Weight: 137 lb 2 oz (62.2 kg) IBW/kg (Calculated) : 59.3 Heparin Dosing Weight: 62.2 lg = TBW  Vital Signs: Temp: 98.7 F (37.1 C) (08/19 1050) Temp Source: Oral (08/19 1050) BP: 137/76 (08/19 1124) Pulse Rate: 101 (08/19 1124)  Labs: Recent Labs    01/06/19 0629 01/06/19 0933 01/07/19 0650 01/07/19 0931  HGB 7.4*  --  7.2*  --   HCT 25.1*  --  23.1*  --   PLT 234  --  233  --   LABPROT  --  19.7*  --  21.2*  INR  --  1.7*  --  1.9*  CREATININE 5.86*  --  7.55*  --     Estimated Creatinine Clearance: 7.8 mL/min (A) (by C-G formula based on SCr of 7.55 mg/dL (H)).   Medical History: Past Medical History:  Diagnosis Date  . Anemia   . Aortic stenosis 09/25/2016   Echo 07/24/16: Mod conc LVH, EF 60-65, no RWMA, Gr 2 DDd, bicuspid aortic valve, mild to mod AS (mean 18, peak 38), MAC, mod mitral stenosis (mean 9, peak 19), mild to mod MR, severe LAE, normal RVSF, mild RAE, mild TR  . Arthritis    "joints" (10/23/2017)  . Blood transfusion '08   [facility]; "low HgB" (10/23/2017)  . Chronic diastolic (congestive) heart failure ([place])   . Chronic diastolic CHF (congestive heart failure) ([place])   . Claustrophobia   . Dysfunctional uterine bleeding 12/19/2010  . ESRD (end stage renal disease) on dialysis [facility])    "MWF; [name]." (10/23/2017)  . GERD (gastroesophageal reflux disease)   . Headache   . Heart murmur   . Hemodialysis patient [facility])    right extremity port  . History of hiatal hernia   . Hx of cardiovascular stress test    Lexiscan Myoview 4/16:  Normal stress nuclear study, EF 59%  . Hypertension   . Lupus ([place])    "? kind" (10/23/2017)  . Mitral stenosis    Echo 4/16:  EF 55-60%, no RWMA, Gr 1 DD, mod MS (mean 9 mmHg), mod LAE, mild RAE, PASP 65, mod to severe TR, trivial eff // Echo 6/19:  Mild  LVH, EF 55-60, no RWMA, Gr 2 DD, mild to mod AS (Mean 20), severe MS (mean 17), massive LAE, PASP 48, trivial effusion   . Peptic ulcer disease   . Pneumonia 10/21/2017  . PONV (postoperative nausea and vomiting)   . S/P aortic valve replacement with metallic valve 4/74/2595   21 mm Sorin [employer]omedics bileaflet mechanical valve  . S/P mitral valve replacement with metallic valve 6/38/7564   31 mm Sorin [employer]omed[employer] Optiform bileaflet mechanical valve  . Stroke [facility])    per patient "they said i had a small stroke but i couldnt even tell"    Medications:  Medications Prior to Admission  Medication Sig Dispense Refill Last Dose  . amLODipine (NORVASC) 10 MG tablet Take 1 tablet (10 mg total) by mouth at bedtime. 30 tablet 4 01/05/2019 at Unknown time  . aspirin EC 81 MG tablet Take 81 mg by mouth at bedtime.   Past Week at Unknown time  . calcitRIOL (ROCALTROL) 0.25 MCG capsule Take 7 capsules (1.75 mcg total) by mouth every other day. 14 capsule 0 01/05/2019 at Unknown time  . cloNIDine (CATAPRES) 0.2 MG tablet Take  1 tablet (0.2 mg) by mouth twice daily - on dialysis days take after dialysis and at bedtime, on other days take one in the morning and one at night (Patient taking differently: Take 0.2 mg by mouth See admin instructions. Take 1 tablet (0.2 mg) by mouth twice daily - on dialysis days take after dialysis and at bedtime, on other days take one in the morning and one at night) 180 tablet 3 01/05/2019 at Unknown time  . ferric citrate (AURYXIA) 1 GM 210 MG(Fe) tablet Take 840 mg by mouth 3 (three) times daily with meals.    01/05/2019 at Unknown time  . labetalol (NORMODYNE) 300 MG tablet Take 1 tablet (300 mg total) by mouth 2 (two) times daily. 90 tablet 3 01/05/2019 at 1000  . multivitamin (RENA-VIT) TABS tablet Take 1 tablet by mouth at bedtime.    01/05/2019 at Unknown time  . ondansetron (ZOFRAN) 4 MG tablet Take 4 mg by mouth every 6 (six) hours as needed for nausea or vomiting.    01/05/2019 at Unknown time  . pantoprazole (PROTONIX) 40 MG tablet Take 40 mg by mouth 2 (two) times daily.    01/05/2019 at Unknown time  . predniSONE (DELTASONE) 10 MG tablet Take 10 mg by mouth daily with breakfast.   Past Week at Unknown time  . traMADol (ULTRAM) 50 MG tablet Take 1 tablet (50 mg total) by mouth every 4 (four) hours as needed for moderate pain. 30 tablet 0 01/05/2019 at Unknown time  . warfarin (COUMADIN) 3 MG tablet Take 1 tablet (3 mg total) by mouth daily. (Patient taking differently: Take 3-6 mg by mouth See admin instructions. Take 64m Monday,Wednesday,Fridays, then take 1 tablet all other days per patient) 30 tablet 11 01/05/2019 at Unknown time    Assessment: 58y.o female on warfarin pta for recent h/o S/P aortic and mitral valve replacement with mechanical valves (12/02/18).  Goal INR per anticog clinic is 2.5-3.5 INR subtherapeutic 1.7 on admission 8/18. Warfain on hold for thoracentesis.  INR today is 1.9, subtherapeutic.    Now s/p successful image-guided left thoracentesis at 15:06 8/19 Yielded 900 liters of dark red fluid  Dr. PDoristine Bosworth plans for TCTS consult and wants to Hold warfarin and requested pharmacy to dose IV heparin , no bolus. We decided to target lower end of therapeutic heparin level goal of 0.3-0.5 due to thoracentess yielded 900 L dark red fluid.    Warfarin PTA dose : 625mqMWF and 2m56mTTSS, last taken 8/17  Goal of Therapy:  Heparin level 0.3-0.5 units/ml     Plan:  -At 19:15 (4h post thoracentesis) will start IV heparin drip 600 units/hr, no bolus Check 6 hour Heparin level Daily HL, CBC, INR Monitor for s/sx of bleeding I notified RN for heparin due time and to monitor for bleeding.   Thank you for allowing pharmacy to be part of this patients care team. RutNicole CellaPhBethel Islandarmacist [phone] check AMION for all [facility] [place] numbers After 10:00 PM, call [phone]/2020,4:38 PM

## 2019-01-07 NOTE — Procedures (Signed)
PROCEDURE SUMMARY:  Successful image-guided left thoracentesis. Yielded 900 liters of dark red fluid. Procedure was stopped after 900 mL due to patient's symptoms of coughing and chest pain. Patient tolerated procedure well. No immediate complications. EBL < 5 mL.  Specimen was sent for labs. CXR ordered.  Claris Pong Tilly Pernice PA-C 01/07/2019 3:07 PM

## 2019-01-08 ENCOUNTER — Inpatient Hospital Stay (HOSPITAL_COMMUNITY): Payer: Medicare Other

## 2019-01-08 DIAGNOSIS — J90 Pleural effusion, not elsewhere classified: Principal | ICD-10-CM

## 2019-01-08 DIAGNOSIS — Z720 Tobacco use: Secondary | ICD-10-CM

## 2019-01-08 DIAGNOSIS — R0902 Hypoxemia: Secondary | ICD-10-CM

## 2019-01-08 DIAGNOSIS — J869 Pyothorax without fistula: Secondary | ICD-10-CM

## 2019-01-08 LAB — BASIC METABOLIC PANEL
Anion gap: 11 (ref 5–15)
BUN: 22 mg/dL — ABNORMAL HIGH (ref 6–20)
CO2: 27 mmol/L (ref 22–32)
Calcium: 9 mg/dL (ref 8.9–10.3)
Chloride: 96 mmol/L — ABNORMAL LOW (ref 98–111)
Creatinine, Ser: 5.01 mg/dL — ABNORMAL HIGH (ref 0.44–1.00)
GFR calc Af Amer: 10 mL/min — ABNORMAL LOW (ref >60)
GFR calc non Af Amer: 9 mL/min — ABNORMAL LOW (ref >60)
Glucose, Bld: 97 mg/dL (ref 70–99)
Potassium: 4.1 mmol/L (ref 3.5–5.1)
Sodium: 134 mmol/L — ABNORMAL LOW (ref 135–145)

## 2019-01-08 LAB — CBC
HCT: 23.7 % — ABNORMAL LOW (ref 36.0–46.0)
Hemoglobin: 7 g/dL — ABNORMAL LOW (ref 12.0–15.0)
MCH: 30.4 pg (ref 26.0–34.0)
MCHC: 29.5 g/dL — ABNORMAL LOW (ref 30.0–36.0)
MCV: 103 fL — ABNORMAL HIGH (ref 80.0–100.0)
Platelets: 211 10*3/uL (ref 150–400)
RBC: 2.3 MIL/uL — ABNORMAL LOW (ref 3.87–5.11)
RDW: 17.2 % — ABNORMAL HIGH (ref 11.5–15.5)
WBC: 5.2 10*3/uL (ref 4.0–10.5)
nRBC: 0 % (ref 0.0–0.2)

## 2019-01-08 LAB — HEPARIN LEVEL (UNFRACTIONATED)
Heparin Unfractionated: 0.17 IU/mL — ABNORMAL LOW (ref 0.30–0.70)
Heparin Unfractionated: 0.2 IU/mL — ABNORMAL LOW (ref 0.30–0.70)
Heparin Unfractionated: 0.34 IU/mL (ref 0.30–0.70)

## 2019-01-08 LAB — PROTIME-INR
INR: 1.9 — ABNORMAL HIGH (ref 0.8–1.2)
Prothrombin Time: 21.7 seconds — ABNORMAL HIGH (ref 11.4–15.2)

## 2019-01-08 LAB — PROCALCITONIN: Procalcitonin: 0.63 ng/mL

## 2019-01-08 LAB — LACTATE DEHYDROGENASE: LDH: 209 U/L — ABNORMAL HIGH (ref 98–192)

## 2019-01-08 MED ORDER — DARBEPOETIN ALFA 200 MCG/0.4ML IJ SOSY
200.0000 ug | PREFILLED_SYRINGE | INTRAMUSCULAR | Status: DC
  Filled 2019-01-08: qty 0.4

## 2019-01-08 MED ORDER — CINACALCET HCL 30 MG PO TABS
90.0000 mg | ORAL_TABLET | ORAL | Status: DC
  Administered 2019-01-09: 90 mg via ORAL
  Filled 2019-01-08: qty 3

## 2019-01-08 MED ORDER — SODIUM CHLORIDE 0.9 % IV SOLN
125.0000 mg | INTRAVENOUS | Status: DC
  Administered 2019-01-09: 125 mg via INTRAVENOUS
  Filled 2019-01-08: qty 10

## 2019-01-08 NOTE — Progress Notes (Addendum)
Subjective:  no cos in room tolerated HD yest on Schedule /and Left Pleural Effusion   thoracentesis 900 cc noted =  that was exudative and blood with 1775 WBC and 72% PMNs who is afebrile with normal WBC and procal of 0.63  Objective Vital signs in last 24 hours: Vitals:   01/07/19 2020 01/08/19 0600 01/08/19 0700 01/08/19 1409  BP: 140/78 (!) 144/88  (!) 137/95  Pulse: (!) 103 91  91  Resp:      Temp: 99 F (37.2 C) 98.8 F (37.1 C)  98.6 F (37 C)  TempSrc: Oral Oral  Oral  SpO2: 100% 97%  97%  Weight:   62.1 kg   Height:       Weight change: 0.6 kg  Physical Exam: General: alert Thin female chronically ill,NAD  Heart: RRR ,  Lungs: Decreased BS  Bases and CTA , non labored breathing  Abdomen: BS pos .,soft , Umbilical hernia NT, ND Extremities: No pedal edema Dialysis Access: Pos . Bruit  AVF  OP Dialysis: [facility] MWF  4h  60.5kg  2/2 bath  P2  AVF  Hep 1800  mircera 225 q 2wks last 8/5 Venofer  Load 100 mg  q hd last dose 01/16/19   sensipar 120 tiw  Problem/Plan: 1. ESRD - MWF HD. HD  On schedule  2. Large left sided pleural effusion- sp Thoracentesis  - as above wu per admit team 3. S/p AVR/MVR on 12/02/2018: on anticoagulation. 4. SLE - on steroids 5. HTN - continue home regimen + UF w/ HD today 6. 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  [name]  MD 01/08/2019, 5:07 PM    Labs: Basic Metabolic Panel: Recent Labs  Lab 01/06/19 0629 01/07/19 0650 01/08/19 0330  NA 134* 130* 134*  K 3.4* 3.6 4.1  CL 89* 87* 96*   CO2 31 28 27   GLUCOSE 103* 89 97  BUN 26* 35* 22*  CREATININE 5.86* 7.55* 5.01*  CALCIUM 9.0 9.1 9.0  PHOS  --  6.4*  --    Liver Function Tests: Recent Labs  Lab 01/06/19 0629 01/07/19 0650  AST 28  --   ALT 24  --   ALKPHOS 76  --   BILITOT 0.7  --   PROT 6.7  --   ALBUMIN 3.0* 3.0*   No results for input(s): LIPASE, AMYLASE in the last 168 hours. No results for input(s): AMMONIA in the last 168 hours. CBC: Recent Labs  Lab 01/06/19 0629 01/07/19 0650 01/08/19 0330  WBC 5.7 5.5 5.2  HGB 7.4* 7.2* 7.0*  HCT 25.1* 23.1* 23.7*  MCV 102.4* 100.0 103.0*  PLT 234 233 211   Cardiac Enzymes: No results for input(s): CKTOTAL, CKMB, CKMBINDEX, TROPONINI in the last 168 hours. CBG: No results for input(s): GLUCAP in the last 168 hours.  Studies/Results: Dg Chest 1 View  Result Date: 01/07/2019 CLINICAL DATA:  58 year old female status post thoracentesis. 900 cc removed from  right lung. EXAM: CHEST  1 VIEW COMPARISON:  Chest radiograph dated 01/06/2019 FINDINGS: There are bilateral pleural effusions with associated atelectatic changes of the lung bases. Pneumonia is not excluded. Similar or slightly improved aeration of the lungs compared to the radiograph of 01/06/2019. No pneumothorax. Stable cardiac silhouette. Median sternotomy wires and mitral and aortic valve replacement. No acute osseous pathology. IMPRESSION: 1. No pneumothorax. 2. Persistent bilateral pleural effusions and bibasilar atelectatic changes. Overall similar or slight improvement of the aeration of the lungs since the prior radiograph. Electronically Signed   By: Anner Crete M.D.   On: 01/07/2019 15:26   Dg Chest Port 1 View  Result Date: 01/08/2019 CLINICAL DATA:  Pleural effusion.  Thoracentesis yesterday EXAM: PORTABLE CHEST 1 VIEW COMPARISON:  01/07/2019 FINDINGS: Small bilateral pleural effusions remain. Bibasilar atelectasis. No pneumothorax. Changes of median sternotomy and valve replacement.  IMPRESSION: Continued small bilateral effusions with bibasilar atelectasis. No change. Electronically Signed   By: Rolm Baptise M.D.   On: 01/08/2019 11:37   Ir Thoracentesis Asp Pleural Space W/img Guide  Result Date: 01/07/2019 INDICATION: Patient with history of open aortic valve replacement in OR 12/02/2018 by Dr. Marcie Bal, now with dyspnea and bilateral pleural effusions, L>R. Request is made for diagnostic and therapeutic left thoracentesis. EXAM: ULTRASOUND GUIDED DIAGNOSTIC AND THERAPEUTIC LEFT THORACENTESIS MEDICATIONS: 10 mL 1% lidocaine COMPLICATIONS: None immediate. PROCEDURE: An ultrasound guided thoracentesis was thoroughly discussed with the patient and questions answered. The benefits, risks, alternatives and complications were also discussed. The patient understands and wishes to proceed with the procedure. Written consent was obtained. Ultrasound was performed to localize and mark an adequate pocket of fluid in the left chest. The area was then prepped and draped in the normal sterile fashion. 1% Lidocaine was used for local anesthesia. Under ultrasound guidance a 6 Fr Safe-T-Centesis catheter was introduced. Thoracentesis was performed. The catheter was removed and a dressing applied. FINDINGS: A total of approximately 900 mL of dark red fluid was removed. Procedure was stopped after 900 mL due to patient's symptoms of coughing and chest pain. Samples were sent to the laboratory as requested by the clinical team. IMPRESSION: Successful ultrasound guided left thoracentesis yielding 900 mL of pleural fluid. Read by: Earley Abide, PA-C Electronically Signed   By: Aletta Edouard M.D.   On: 01/07/2019 15:37   Medications: . sodium chloride    . heparin 900 Units/hr (01/08/19 1402)   . amLODipine  10 mg Oral QHS  . calcitRIOL  1.75 mcg Oral QODAY  . Chlorhexidine Gluconate Cloth  6 each Topical Q0600  . cloNIDine  0.2 mg Oral BID  . ferric citrate  840 mg Oral TID WC  . labetalol  300  mg Oral BID  . LORazepam  0.5 mg Intravenous Once  . multivitamin  1 tablet Oral QHS  . pantoprazole  40 mg Oral BID  . sodium chloride flush  3 mL Intravenous Once  . sodium chloride flush  3 mL Intravenous Q12H

## 2019-01-08 NOTE — Progress Notes (Addendum)
ANTICOAGULATION CONSULT NOTE   Pharmacy Consult for Heparin  Indication:  H/o recent s/p   No Known Allergies  Patient Measurements: Height: _0  (167.6 cm) Weight: 136 lb 14.5 oz (62.1 kg) IBW/kg (Calculated) : 59.3 Heparin Dosing Weight: 62.2 lg = TBW  Vital Signs: Temp: 98.6 F (37 C) (08/20 1409) Temp Source: Oral (08/20 1409) BP: 137/95 (08/20 1409) Pulse Rate: 91 (08/20 1409)  Labs: Recent Labs    01/06/19 0629 01/06/19 0933 01/07/19 0650 01/07/19 0931 01/08/19 0330 01/08/19 1113 01/08/19 1924  HGB 7.4*  --  7.2*  --  7.0*  --   --   HCT 25.1*  --  23.1*  --  23.7*  --   --   PLT 234  --  233  --  211  --   --   LABPROT  --  19.7*  --  21.2* 21.7*  --   --   INR  --  1.7*  --  1.9* 1.9*  --   --   HEPARINUNFRC  --   --   --   --  0.17* 0.20* 0.34  CREATININE 5.86*  --  7.55*  --  5.01*  --   --     Estimated Creatinine Clearance: 11.7 mL/min (A) (by C-G formula based on SCr of 5.01 mg/dL (H)).   Medical History: Past Medical History:  Diagnosis Date  . Anemia   . Aortic stenosis 09/25/2016   Echo 07/24/16: Mod conc LVH, EF 60-65, no RWMA, Gr 2 DDd, bicuspid aortic valve, mild to mod AS (mean 18, peak 38), MAC, mod mitral stenosis (mean 9, peak 19), mild to mod MR, severe LAE, normal RVSF, mild RAE, mild TR  . Arthritis    "joints" (10/23/2017)  . Blood transfusion '08   [facility]; "low HgB" (10/23/2017)  . Chronic diastolic (congestive) heart failure ([place])   . Chronic diastolic CHF (congestive heart failure) ([place])   . Claustrophobia   . Dysfunctional uterine bleeding 12/19/2010  . ESRD (end stage renal disease) on dialysis [facility])    "MWF; [name]." (10/23/2017)  . GERD (gastroesophageal reflux disease)   . Headache   . Heart murmur   . Hemodialysis patient [facility])    right extremity port  . History of hiatal hernia   . Hx of cardiovascular stress test    Lexiscan Myoview 4/16:  Normal stress nuclear study, EF 59%  . Hypertension   . Lupus ([place])    "? kind"  (10/23/2017)  . Mitral stenosis    Echo 4/16:  EF 55-60%, no RWMA, Gr 1 DD, mod MS (mean 9 mmHg), mod LAE, mild RAE, PASP 65, mod to severe TR, trivial eff // Echo 6/19:  Mild LVH, EF 55-60, no RWMA, Gr 2 DD, mild to mod AS (Mean 20), severe MS (mean 17), massive LAE, PASP 48, trivial effusion   . Peptic ulcer disease   . Pneumonia 10/21/2017  . PONV (postoperative nausea and vomiting)   . S/P aortic valve replacement with metallic valve 9/62/9528   21 mm Sorin Carbomedics bileaflet mechanical valve  . S/P mitral valve replacement with metallic valve 09/01/2438   31 mm Sorin Carbomedics Optiform bileaflet mechanical valve  . Stroke [facility])    per patient "they said i had a small stroke but i couldnt even tell"    Medications:  Medications Prior to Admission  Medication Sig Dispense Refill Last Dose  . amLODipine (NORVASC) 10 MG tablet Take 1 tablet (10 mg total) by mouth  at bedtime. 30 tablet 4 01/05/2019 at Unknown time  . aspirin EC 81 MG tablet Take 81 mg by mouth at bedtime.   Past Week at Unknown time  . calcitRIOL (ROCALTROL) 0.25 MCG capsule Take 7 capsules (1.75 mcg total) by mouth every other day. 14 capsule 0 01/05/2019 at Unknown time  . cloNIDine (CATAPRES) 0.2 MG tablet Take 1 tablet (0.2 mg) by mouth twice daily - on dialysis days take after dialysis and at bedtime, on other days take one in the morning and one at night (Patient taking differently: Take 0.2 mg by mouth See admin instructions. Take 1 tablet (0.2 mg) by mouth twice daily - on dialysis days take after dialysis and at bedtime, on other days take one in the morning and one at night) 180 tablet 3 01/05/2019 at Unknown time  . ferric citrate (AURYXIA) 1 GM 210 MG(Fe) tablet Take 840 mg by mouth 3 (three) times daily with meals.    01/05/2019 at Unknown time  . labetalol (NORMODYNE) 300 MG tablet Take 1 tablet (300 mg total) by mouth 2 (two) times daily. 90 tablet 3 01/05/2019 at 1000  . multivitamin (RENA-VIT) TABS tablet Take 1  tablet by mouth at bedtime.    01/05/2019 at Unknown time  . ondansetron (ZOFRAN) 4 MG tablet Take 4 mg by mouth every 6 (six) hours as needed for nausea or vomiting.   01/05/2019 at Unknown time  . pantoprazole (PROTONIX) 40 MG tablet Take 40 mg by mouth 2 (two) times daily.    01/05/2019 at Unknown time  . predniSONE (DELTASONE) 10 MG tablet Take 10 mg by mouth daily with breakfast.   Past Week at Unknown time  . traMADol (ULTRAM) 50 MG tablet Take 1 tablet (50 mg total) by mouth every 4 (four) hours as needed for moderate pain. 30 tablet 0 01/05/2019 at Unknown time  . warfarin (COUMADIN) 3 MG tablet Take 1 tablet (3 mg total) by mouth daily. (Patient taking differently: Take 3-6 mg by mouth See admin instructions. Take 84m Monday,Wednesday,Fridays, then take 1 tablet all other days per patient) 30 tablet 11 01/05/2019 at Unknown time    Assessment: 58y.o female on warfarin pta for recent h/o S/P aortic and mitral valve replacement with mechanical valves (12/02/18).  Goal INR per anticog clinic is 2.5-3.5 INR subtherapeutic 1.7 on admission 8/18. Warfain on hold for thoracentesis.    INR today is 1.9, subtherapeutic.  S/p thoracentesis on 8/19 with plans for TCTS consult.   Heparin level tonight came back at 0.34, therapeutic, on 900 units/hr. Level was drawn early (~5 hr after previous rate change). Hgb low at 7, plt 211. No s/sx of bleeding. No infusion issues.   Warfarin PTA dose : 612mqMWF and 81m64mTTSS, last taken 8/17  Goal of Therapy:  Heparin level 0.3-0.5 units/ml (due to dark red fluid s/p thoracentesis)  Plan:  Continue heparin infusion at 900 units/hr Daily HL, CBC, INR Monitor for s/sx of bleeding  Thank you for allowing pharmacy to be part of this patients care team.  KimAntonietta JewelharmD, BCCCP Clinical Pharmacist  Phone: [phone] check AMION for all [facility] numbers After 10:00 PM, call [phone]/2020,8:08 PM

## 2019-01-08 NOTE — Progress Notes (Signed)
ANTICOAGULATION CONSULT NOTE - Initial Consult  Pharmacy Consult for Heparin  Indication:  H/o MVR  No Known Allergies  Patient Measurements: Height: _0  (167.6 cm) Weight: 137 lb 2 oz (62.2 kg) IBW/kg (Calculated) : 59.3 Heparin Dosing Weight: 62.2 lg = TBW  Vital Signs: Temp: 99 F (37.2 C) (08/19 2020) Temp Source: Oral (08/19 2020) BP: 140/78 (08/19 2020) Pulse Rate: 103 (08/19 2020)  Labs: Recent Labs    01/06/19 0629 01/06/19 0933 01/07/19 0650 01/07/19 0931 01/08/19 0330  HGB 7.4*  --  7.2*  --  7.0*  HCT 25.1*  --  23.1*  --  23.7*  PLT 234  --  233  --  211  LABPROT  --  19.7*  --  21.2* 21.7*  INR  --  1.7*  --  1.9* 1.9*  HEPARINUNFRC  --   --   --   --  0.17*  CREATININE 5.86*  --  7.55*  --  5.01*    Estimated Creatinine Clearance: 11.7 mL/min (A) (by C-G formula based on SCr of 5.01 mg/dL (H)).   Medical History: Past Medical History:  Diagnosis Date  . Anemia   . Aortic stenosis 09/25/2016   Echo 07/24/16: Mod conc LVH, EF 60-65, no RWMA, Gr 2 DDd, bicuspid aortic valve, mild to mod AS (mean 18, peak 38), MAC, mod mitral stenosis (mean 9, peak 19), mild to mod MR, severe LAE, normal RVSF, mild RAE, mild TR  . Arthritis    "joints" (10/23/2017)  . Blood transfusion '08   [facility]; "low HgB" (10/23/2017)  . Chronic diastolic (congestive) heart failure ([place])   . Chronic diastolic CHF (congestive heart failure) ([place])   . Claustrophobia   . Dysfunctional uterine bleeding 12/19/2010  . ESRD (end stage renal disease) on dialysis [facility])    "MWF; [name]." (10/23/2017)  . GERD (gastroesophageal reflux disease)   . Headache   . Heart murmur   . Hemodialysis patient [facility])    right extremity port  . History of hiatal hernia   . Hx of cardiovascular stress test    Lexiscan Myoview 4/16:  Normal stress nuclear study, EF 59%  . Hypertension   . Lupus ([place])    "? kind" (10/23/2017)  . Mitral stenosis    Echo 4/16:  EF 55-60%, no RWMA, Gr 1 DD, mod MS (mean 9 mmHg),  mod LAE, mild RAE, PASP 65, mod to severe TR, trivial eff // Echo 6/19:  Mild LVH, EF 55-60, no RWMA, Gr 2 DD, mild to mod AS (Mean 20), severe MS (mean 17), massive LAE, PASP 48, trivial effusion   . Peptic ulcer disease   . Pneumonia 10/21/2017  . PONV (postoperative nausea and vomiting)   . S/P aortic valve replacement with metallic valve 3/50/0938   21 mm Sorin Carbomedics bileaflet mechanical valve  . S/P mitral valve replacement with metallic valve 1/82/9937   31 mm Sorin [employer]omed[employer] Optiform bileaflet mechanical valve  . Stroke [facility])    per patient "they said i had a small stroke but i couldnt even tell"   Assessment: 58 y.o female on warfarin pta for recent h/o S/P aortic and mitral valve replacement with mechanical valves (12/02/18).  Goal INR per anticog clinic is 2.5-3.5 INR subtherapeutic 1.7 on admission 8/18. Warfain on hold for thoracentesis.  INR today is 1.9, subtherapeutic.    Now s/p successful image-guided left thoracentesis at 15:06 8/19 Yielded 900 liters of dark red fluid  Dr. Doristine Bosworth- plans for TCTS consult  and wants to Hold warfarin and requested pharmacy to dose IV heparin , no bolus. We decided to target lower end of therapeutic heparin level goal of 0.3-0.5 due to thoracentess yielded 900 L dark red fluid.   Heparin level this morning 0.17 units/ml.  No bleeding reported  Goal of Therapy:  Heparin level 0.3-0.5 units/ml     Plan:  Increase heparin drip to 750 units/hr Check 6 hour Heparin level Daily HL, CBC, INR Monitor for s/sx of bleeding  Thank you for allowing pharmacy to be part of this patients care team. Excell Seltzer, PharmD Clinical Pharmacist  01/08/2019,5:31 AM

## 2019-01-08 NOTE — Consult Note (Signed)
NAME:  [name], MRN:  [id number], DOB:  [birth year], LOS: 1 ADMISSION DATE:  01/06/2019, CONSULTATION DATE:  01/08/19 REFERRING MD:  TR[facility] - Pahwani, CHIEF COMPLAINT:  Pleural effusion, concern for infection   Brief History   58 year old female with ESRD on dialysis MWF who had a dual valve replacement on 12/02/2018 who presents to [facility] with N/V and abdominal pain.  Patient was noted to have a pleural effusion that was tapped on 8/19 that was exudative and blood with 1775 WBC and 72% PMNs who is afebrile with normal WBC and procal of 0.63.  Primary service consulted CVTS who recommended watchful waiting for now and no further interventions.  There was a concern for an infection to PCCM was contacted.  No organisms were seen on the pleural fluid and cultures are negative to date.  Patient reports no signs of infection.  History of present illness   58 year old female with ESRD on dialysis MWF who had a dual valve replacement on 12/02/2018 who presents to [facility] with N/V and abdominal pain.  Patient was noted to have a pleural effusion that was tapped on 7/19 that was exudative and blood with 1775 WBC and 72% PMNs who is afebrile with normal WBC and procal of 0.63.  Primary service consulted CVTS who recommended watchful waiting for now and no further interventions.  There was a concern for an infection to PCCM was contacted.  No organisms were seen on the pleural fluid and cultures are negative to date.  Patient reports no signs of infection.  Past Medical History  ESRD Lupus Dual valve replacement on 12/02/2018  [place] on 8/19  Consults:  CVTS PCCM  Procedures:  Inocencio Homes 8/19 exudative fluid  Significant Diagnostic Tests:  CXR that I reviewed myself, 8/20, minimal effusion  Micro Data:  Pleural fluid 8/19>>>NTD  Antimicrobials:  None   Interim history/subjective:  Feels better, no complaints from a pulmonary standpoint  Objective   Blood pressure (!)  144/88, pulse 91, temperature 98.8 F (37.1 C), temperature source Oral, resp. rate 18, height 5\' 6"  (1.676 m), weight 62.1 kg, last menstrual period 12/05/2010, SpO2 97 %.        Intake/Output Summary (Last 24 hours) at 01/08/2019 1346 Last data filed at 01/08/2019 0500 Gross per 24 hour  Intake 54.02 ml  Output -  Net 54.02 ml   Filed Weights   01/07/19 0717 01/07/19 1124 01/08/19 0700  Weight: 61.9 kg 62.2 kg 62.1 kg    Examination: General: Chronically ill appearing, non toxic, NAD HENT: [place]/AT, PERRL, EOM-I and MMM Lungs: CTA bilaterally with decreased BS at the bases Cardiovascular: RRR, Nl S1/S2 and -M/R/G Abdomen: Soft but diffusely tender, ND and +BS Extremities: -edema and -tenderness Neuro: Alert and interactive, moving all ext to command Skin: [facility] Problem list   N/A  Discussed with PCCM-NP  Assessment & Plan:  58 year old female with extensive past medical history presenting with pleural effusion post cardiotomy that appears non-toxic, normal WBC and no fever.  Pleural effusion: post cardiotomy  - CXR in AM  - No need to retap at this time  - NSAIDs for pleural fluid control if cultures are negative for post cardiotomy syndrome  Empyema:   - F/U on fluid cultures  - If positive will consider abx and call ID  - Ignore procalcitonin in a non-toxic appearing patient  Hypoxemia:  - Titrate O2 for sat of 88-92%  -  Will need an ambulatory desaturation study prior to discharge for home O2 levels  Tobacco abuse:  - Smoking cessation counseling   Labs   CBC: Recent Labs  Lab 01/06/19 0629 01/07/19 0650 01/08/19 0330  WBC 5.7 5.5 5.2  HGB 7.4* 7.2* 7.0*  HCT 25.1* 23.1* 23.7*  MCV 102.4* 100.0 103.0*  PLT 234 233 109    Basic Metabolic Panel: Recent Labs  Lab 01/06/19 0629 01/07/19 0650 01/08/19 0330  NA 134* 130* 134*  K 3.4* 3.6 4.1  CL 89* 87* 96*  CO2 31 28 27   GLUCOSE 103* 89 97  BUN 26* 35* 22*  CREATININE 5.86*  7.55* 5.01*  CALCIUM 9.0 9.1 9.0  PHOS  --  6.4*  --    GFR: Estimated Creatinine Clearance: 11.7 mL/min (A) (by C-G formula based on SCr of 5.01 mg/dL (H)). Recent Labs  Lab 01/06/19 0629 01/07/19 0650 01/08/19 0330  PROCALCITON  --   --  0.63  WBC 5.7 5.5 5.2    Liver Function Tests: Recent Labs  Lab 01/06/19 0629 01/07/19 0650  AST 28  --   ALT 24  --   ALKPHOS 76  --   BILITOT 0.7  --   PROT 6.7  --   ALBUMIN 3.0* 3.0*   No results for input(s): LIPASE, AMYLASE in the last 168 hours. No results for input(s): AMMONIA in the last 168 hours.  ABG    Component Value Date/Time   PHART 7.264 (L) 12/02/2018 1829   PCO2ART 56.3 (H) 12/02/2018 1829   PO2ART 86.0 12/02/2018 1829   HCO3 25.8 12/02/2018 1829   TCO2 28 12/02/2018 1829   ACIDBASEDEF 2.0 12/02/2018 1829   O2SAT 95.0 12/02/2018 1829     Coagulation Profile: Recent Labs  Lab 01/02/19 1556 01/06/19 0933 01/07/19 0931 01/08/19 0330  INR 2.2 1.7* 1.9* 1.9*    Cardiac Enzymes: No results for input(s): CKTOTAL, CKMB, CKMBINDEX, TROPONINI in the last 168 hours.  HbA1C: Hgb A1c MFr Bld  Date/Time Value Ref Range Status  11/27/2018 12:25 PM 4.9 4.8 - 5.6 % Final    Comment:    (NOTE)         Prediabetes: 5.7 - 6.4         Diabetes: >6.4         Glycemic control for adults with diabetes: <7.0     CBG: No results for input(s): GLUCAP in the last 168 hours.  Review of Systems:   12 point ROS is negative other than above  Past Medical History  She,  has a past medical history of Anemia, Aortic stenosis (09/25/2016), Arthritis, Blood transfusion ('08), Chronic diastolic (congestive) heart failure (HCC), Chronic diastolic CHF (congestive heart failure) ([place]), Claustrophobia, Dysfunctional uterine bleeding (12/19/2010), ESRD (end stage renal disease) on dialysis [facility]), GERD (gastroesophageal reflux disease), Headache, Heart murmur, Hemodialysis patient [facility]), History of hiatal hernia, cardiovascular stress  test, Hypertension, Lupus ([place]), Mitral stenosis, Peptic ulcer disease, Pneumonia (10/21/2017), PONV (postoperative nausea and vomiting), S/P aortic valve replacement with metallic valve (08/11/5571), S/P mitral valve replacement with metallic valve (07/10/2540), and Stroke ([place]).   Surgical History    Past Surgical History:  Procedure Laterality Date  . AORTIC VALVE REPLACEMENT  12/02/2018   AORTIC VALVE REPLACEMENT (AVR) USING CARBOMEDICS SUPRA-ANNULAR TOP HAT SIZE 21MM (N/A)  . AORTIC VALVE REPLACEMENT N/A 12/02/2018   Procedure: AORTIC VALVE REPLACEMENT (AVR) USING CARBOMEDICS SUPRA-ANNULAR TOP HAT SIZE 21MM;  Surgeon: Rexene Alberts, MD;  Location: [place];  Service: Open  Heart Surgery;  Laterality: N/A;  . AV FISTULA PLACEMENT Right 02/25/2018   Procedure: INSERTION OF 36mm x 16cm ARTEGRAFT;  Surgeon: Waynetta Sandy, MD;  Location: [place];  Service: Vascular;  Laterality: Right;  . COLONOSCOPY W/ BIOPSIES AND POLYPECTOMY    . DIALYSIS FISTULA CREATION  2007  . ENDOMETRIAL ABLATION    . ESOPHAGOGASTRODUODENOSCOPY N/A 07/31/2014   Procedure: ESOPHAGOGASTRODUODENOSCOPY (EGD);  Surgeon: Clarene Essex, MD;  Location: [facility] ENDOSCOPY;  Service: Endoscopy;  Laterality: N/A;  . FISTULOGRAM Right 02/25/2018   Procedure: FISTULOGRAM ARM;  Surgeon: Waynetta Sandy, MD;  Location: [place];  Service: Vascular;  Laterality: Right;  . HEMATOMA EVACUATION Right 03/06/2018   Procedure: EVACUATION HEMATOMA RIGHT UPPER ARM;  Surgeon: Waynetta Sandy, MD;  Location: [place];  Service: Vascular;  Laterality: Right;  . INSERTION OF ARTERIOVENOUS (AV) ARTEGRAFT ARM Right 09/26/2017   Procedure: INSERTION OF ARTERIOVENOUS (AV) ARTEGRAFT INTO RIGHT ARM;  Surgeon: Waynetta Sandy, MD;  Location: [place];  Service: Vascular;  Laterality: Right;  . INSERTION OF DIALYSIS CATHETER Right 03/06/2018   Procedure: INSERTION OF DIALYSIS CATHETER, right internal jugular;  Surgeon: Waynetta Sandy, MD;  Location: [place];  Service: Vascular;  Laterality: Right;  . IR THORACENTESIS ASP PLEURAL SPACE W/IMG GUIDE  01/07/2019  . MITRAL VALVE REPLACEMENT N/A 12/02/2018   Procedure: MITRAL VALVE (MV) REPLACEMENT USING CARBOMEDICS OPTIFORM SIZE 31MM;  Surgeon: Rexene Alberts, MD;  Location: [place];  Service: Open Heart Surgery;  Laterality: N/A;  . REVISON OF ARTERIOVENOUS FISTULA Right 09/26/2017   Procedure: REVISION OF ARTERIOVENOUS FISTULA RIGHT ARM WITH ARTEGRAFT;  Surgeon: Waynetta Sandy, MD;  Location: [place];  Service: Vascular;  Laterality: Right;  . REVISON OF ARTERIOVENOUS FISTULA Right 02/25/2018   Procedure: REVISION OF ARTERIOVENOUS FISTULA;  Surgeon: Waynetta Sandy, MD;  Location: [place];  Service: Vascular;  Laterality: Right;  . RIGHT/LEFT HEART CATH AND CORONARY ANGIOGRAPHY N/A 07/24/2018   Procedure: RIGHT/LEFT HEART CATH AND CORONARY ANGIOGRAPHY;  Surgeon: Larey Dresser, MD;  Location: [place] CV LAB;  Service: Cardiovascular;  Laterality: N/A;  . SHOULDER OPEN ROTATOR CUFF REPAIR Right 10/09/2016   Procedure: ROTATOR CUFF REPAIR SHOULDER OPEN partial acrominectomy and extensive synovectomy;  Surgeon: Latanya [name], MD;  Location: [facility];  Service: Orthopedics;  Laterality: Right;  RNFA  . TEE WITHOUT CARDIOVERSION N/A 01/02/2018   Procedure: TRANSESOPHAGEAL ECHOCARDIOGRAM (TEE) Bubble Study;  Surgeon: Larey Dresser, MD;  Location[facility]COPY;  Service: Cardiovascular;  Laterality: N/A;  . TEE WITHOUT CARDIOVERSION N/A 12/02/2018   Procedure: TRANSESOPHAGEAL ECHOCARDIOGRAM (TEE);  Surgeon: Rexene Alberts, MD;  Location: [place];  Service: Open Heart Surgery;  Laterality: N/A;  . TUBAL LIGATION       Social History   reports that she quit smoking about 7 weeks ago. Her smoking use included cigarettes. She started smoking about 14 months ago. She has a 1.00 pack-year smoking history. She has never used smokeless tobacco. She reports current  alcohol use of about 1.0 standard drinks of alcohol per week. She reports current drug use. Drugs: Marijuana and Cocaine.   Family History   Her family history includes Hypertension in her father and mother; Liver cancer in her maternal grandmother; Lymphoma in her maternal aunt; Renal Disease in her father. There is no history of Heart attack.   Allergies No Known Allergies   Home Medications  Prior to Admission medications   Medication Sig Start Date End Date Taking? Authorizing  Provider  amLODipine (NORVASC) 10 MG tablet Take 1 tablet (10 mg total) by mouth at bedtime. 03/15/18  Yes Roxan Hockey, MD  aspirin EC 81 MG tablet Take 81 mg by mouth at bedtime.   Yes [provider]  calcitRIOL (ROCALTROL) 0.25 MCG capsule Take 7 capsules (1.75 mcg total) by mouth every other day. 10/27/17  Yes Phillips Grout, MD  cloNIDine (CATAPRES) 0.2 MG tablet Take 1 tablet (0.2 mg) by mouth twice daily - on dialysis days take after dialysis and at bedtime, on other days take one in the morning and one at night Patient taking differently: Take 0.2 mg by mouth See admin instructions. Take 1 tablet (0.2 mg) by mouth twice daily - on dialysis days take after dialysis and at bedtime, on other days take one in the morning and one at night 09/04/18  Yes Fay Records, MD  ferric citrate (AURYXIA) 1 GM 210 MG(Fe) tablet Take 840 mg by mouth 3 (three) times daily with meals.    Yes [provider]  labetalol (NORMODYNE) 300 MG tablet Take 1 tablet (300 mg total) by mouth 2 (two) times daily. 08/29/18  Yes Fay Records, MD  multivitamin (RENA-VIT) TABS tablet Take 1 tablet by mouth at bedtime.    Yes [provider]  ondansetron (ZOFRAN) 4 MG tablet Take 4 mg by mouth every 6 (six) hours as needed for nausea or vomiting.   Yes [provider]  pantoprazole (PROTONIX) 40 MG tablet Take 40 mg by mouth 2 (two) times daily.    Yes [provider]  predniSONE (DELTASONE) 10 MG  tablet Take 10 mg by mouth daily with breakfast.   Yes [provider]  traMADol (ULTRAM) 50 MG tablet Take 1 tablet (50 mg total) by mouth every 4 (four) hours as needed for moderate pain. 12/11/18  Yes Roddenberry, Arlis Porta, PA-C  warfarin (COUMADIN) 3 MG tablet Take 1 tablet (3 mg total) by mouth daily. Patient taking differently: Take 3-6 mg by mouth See admin instructions. Take 6mg  Monday,Wednesday,Fridays, then take 1 tablet all other days per patient 12/11/18 12/11/19 Yes Antony Odea, PA-C    PCCM will sign off, please call back if needed  Rush Farmer, M.D. [facility] Pulmonary/Critical Care Medicine. Pager: [phone]. After hours pager: [phone].

## 2019-01-08 NOTE — Progress Notes (Signed)
[facility] PROGRESS NOTE  [name] ZOX:[id number] DOB: 04/06/1961 DOA: 01/06/2019 PCP: Benito Mccreedy, MD  Assessment/Plan:  Bilateral pleural effusions left>than right. S/p thoracentesis 9/19 on the left, no fluid to drain on right per IR. 927ml removed.    Fluid red in color, WBC 1775, LD 171, gram stain with moderate WBC. Repeat chest xray with persistent bilateral pleural effusions and bibasilar atelectatic changes noting only slight improvement of the aeration of the lungs from previous image. Concern for infection. Spoke with thoracic surgery and they will follow today. Max temp 99. No leukocytosis, non-toxic appearing. Hg drifting downward at 7.0 today. 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 She was discharged on home oxygen.  Home meds include coumadin. INR 1.9 today.  reports having difficulty getting inr therapeutic since surgery.   -heparin gtt started 8/19 -continue heparin gtt -monitor cbc closely given bloody fluid from thoracentesis  ESRD. Dialysis 8/19 maintaining her MWF schedule -appreciate nephrology assistance -dialysis tomorrow  History of SLE Continue on steroids  Hypertension. Fair control. Home meds include amlodipine, clonidine, labetalol,  -continue home meds except clonidine  chronic diastolic congestive heart failure Seems to be compensated   Code Status: full Family Communication: patient Disposition Plan: home when ready   Consultants:  Roxy Manns thoracic surgery  IR  Procedures:  Thoracentesis 8/19  Antibiotics:    HPI/Subjective: Sitting up in bed eating. Reports breathing "a lot better"  Objective: Vitals:   01/07/19 2020 01/08/19 0600  BP: 140/78 (!) 144/88  Pulse:  (!) 103 91  Resp:    Temp: 99 F (37.2 C) 98.8 F (37.1 C)  SpO2: 100% 97%    Intake/Output Summary (Last 24 hours) at 01/08/2019 1043 Last data filed at 01/08/2019 0500 Gross per 24 hour  Intake 54.02 ml  Output 1736 ml  Net -1681.98 ml   Filed Weights   01/07/19 0717 01/07/19 1124 01/08/19 0700  Weight: 61.9 kg 62.2 kg 62.1 kg    Exam:   General:  Awake alert no acute distress  Cardiovascular: rrr +click no LE edema  Respiratory: normal effort BS distant but clear no wheeze/crackles  Abdomen: non-distended umbilical hernia +BS no guarding or rebounding  Musculoskeletal: joints without swelling/erythema   Data Reviewed: Basic Metabolic Panel: Recent Labs  Lab 01/06/19 0629 01/07/19 0650 01/08/19 0330  NA 134* 130* 134*  K 3.4* 3.6 4.1  CL 89* 87* 96*  CO2 31 28 27   GLUCOSE 103* 89 97  BUN 26* 35* 22*  CREATININE 5.86* 7.55* 5.01*  CALCIUM 9.0 9.1 9.0  PHOS  --  6.4*  --    Liver Function Tests: Recent Labs  Lab 01/06/19 0629 01/07/19 0650  AST 28  --   ALT 24  --   ALKPHOS 76  --   BILITOT 0.7  --   PROT 6.7  --   ALBUMIN 3.0* 3.0*   No results for input(s): LIPASE, AMYLASE in the last 168 hours. No results for input(s): AMMONIA in the last 168 hours. CBC: Recent Labs  Lab 01/06/19 0629 01/07/19 0650 01/08/19 0330  WBC 5.7 5.5 5.2  HGB 7.4* 7.2* 7.0*  HCT 25.1* 23.1* 23.7*  MCV 102.4* 100.0 103.0*  PLT 234 233 211   Cardiac Enzymes: No results for input(s): CKTOTAL, CKMB, CKMBINDEX, TROPONINI in the last 168 hours. BNP (last 3  results) No results for input(s): BNP in the last 8760 hours.  ProBNP (last 3 results) No results for input(s): PROBNP in the last 8760 hours.  CBG: No results for input(s): GLUCAP in the last 168 hours.  Recent Results (from the past 240 hour(s))  SARS Coronavirus 2 [facility] order, Performed in [facility] hospital lab) Nasopharyngeal Nasopharyngeal Swab     Status: None   Collection Time: 01/06/19  4:23  PM   Specimen: Nasopharyngeal Swab  Result Value Ref Range Status   SARS Coronavirus 2 NEGATIVE NEGATIVE Final    Comment: (NOTE) If result is NEGATIVE SARS-CoV-2 target nucleic acids are NOT DETECTED. The SARS-CoV-2 RNA is generally detectable in upper and lower  respiratory specimens during the acute phase of infection. The lowest  concentration of SARS-CoV-2 viral copies this assay can detect is 250  copies / mL. A negative result does not preclude SARS-CoV-2 infection  and should not be used as the sole basis for treatment or other  patient management decisions.  A negative result may occur with  improper specimen collection / handling, submission of specimen other  than nasopharyngeal swab, presence of viral mutation(s) within the  areas targeted by this assay, and inadequate number of viral copies  (<250 copies / mL). A negative result must be combined with clinical  observations, patient history, and epidemiological information. If result is POSITIVE SARS-CoV-2 target nucleic acids are DETECTED. The SARS-CoV-2 RNA is generally detectable in upper and lower  respiratory specimens dur ing the acute phase of infection.  Positive  results are indicative of active infection with SARS-CoV-2.  Clinical  correlation with patient history and other diagnostic information is  necessary to determine patient infection status.  Positive results do  not rule out bacterial infection or co-infection with other viruses. If result is PRESUMPTIVE POSTIVE SARS-CoV-2 nucleic acids MAY BE PRESENT.   A presumptive positive result was obtained on the submitted specimen  and confirmed on repeat testing.  While 2019 novel coronavirus  (SARS-CoV-2) nucleic acids may be present in the submitted sample  additional confirmatory testing may be necessary for epidemiological  and / or clinical management purposes  to differentiate between  SARS-CoV-2 and other Sarbecovirus currently known to infect humans.   If clinically indicated additional testing with an alternate test  methodology [phone]) is advised. The SARS-CoV-2 RNA is generally  detectable in upper and lower respiratory sp ecimens during the acute  phase of infection. The expected result is Negative. Fact Sheet for Patients:  StrictlyIdeas.no Fact Sheet for Healthcare Providers: BankingDealers.co.za This test is not yet approved or cleared by the [place] FDA and has been authorized for detection and/or diagnosis of SARS-CoV-2 by FDA under an Emergency Use Authorization (EUA).  This EUA will remain in effect (meaning this test can be used) for the duration of the COVID-19 declaration under Section 564(b)(1) of the Act, 21 U.S.C. section 360bbb-3(b)(1), unless the authorization is terminated or revoked sooner. Performed at [facility], [place] [address] [place], [place] [address]   Gram stain     Status: None   Collection Time: 01/07/19  3:13 PM   Specimen: Pleural, Right; Pleural Fluid  Result Value Ref Range Status   Specimen Description FLUID RIGHT PLEURAL  Final   Special Requests NONE  Final   Gram Stain   Final    MODERATE WBC PRESENT,BOTH PMN AND MONONUCLEAR NO ORGANISMS SEEN Performed at [facility], [place] [address] [place], [place] [address]  Report Status 01/07/2019 FINAL  Final  Culture, body fluid-bottle     Status: None (Preliminary result)   Collection Time: 01/07/19  3:13 PM   Specimen: Fluid  Result Value Ref Range Status   Specimen Description FLUID RIGHT PLEURAL  Final   Special Requests BOTTLES DRAWN AEROBIC AND ANAEROBIC 10CC  Final   Culture   Final    NO GROWTH < 24 HOURS Performed at [facility], [place] [address] [place], [place] [address]    Report Status PENDING  Incomplete     Studies: Dg Chest 1 View  Result Date: 01/07/2019 CLINICAL DATA:  57 year old female status post thoracentesis. 900 cc removed from right  lung. EXAM: CHEST  1 VIEW COMPARISON:  Chest radiograph dated 01/06/2019 FINDINGS: There are bilateral pleural effusions with associated atelectatic changes of the lung bases. Pneumonia is not excluded. Similar or slightly improved aeration of the lungs compared to the radiograph of 01/06/2019. No pneumothorax. Stable cardiac silhouette. Median sternotomy wires and mitral and aortic valve replacement. No acute osseous pathology. IMPRESSION: 1. No pneumothorax. 2. Persistent bilateral pleural effusions and bibasilar atelectatic changes. Overall similar or slight improvement of the aeration of the lungs since the prior radiograph. Electronically Signed   By: Anner Crete M.D.   On: 01/07/2019 15:26   Ct Abdomen Pelvis W Contrast  Result Date: 01/06/2019 CLINICAL DATA:  Umbilical hernia. EXAM: CT ABDOMEN AND PELVIS WITH CONTRAST TECHNIQUE: Multidetector CT imaging of the abdomen and pelvis was performed using the standard protocol following bolus administration of intravenous contrast. CONTRAST:  188mL OMNIPAQUE IOHEXOL 300 MG/ML  SOLN COMPARISON:  11/29/2018 and 05/15/2017 FINDINGS: Lower chest: There is atelectasis involving the right middle lobe and right lower lobe with asymmetric elevation of the right hemidiaphragm. A moderate volume left pleural effusion is identified with near complete atelectasis and consolidation of the left lower lobe. Hepatobiliary: No focal liver abnormality is seen. No gallstones, gallbladder wall thickening, or biliary dilatation. Pancreas: Unremarkable. No pancreatic ductal dilatation or surrounding inflammatory changes. Spleen: Numerous calcifications identified throughout the spleen as noted previously. Adrenals/Urinary Tract: Bilateral kidney atrophy with multiple cysts. No hydronephrosis identified bilaterally. The urinary bladder appears decompressed. Stomach/Bowel: Stomach is nondistended. No bowel dilatation, wall thickening, or inflammation. Distal colonic diverticulosis  without acute inflammation. Vascular/Lymphatic: Aortic atherosclerosis. Branch vessel disease. No aneurysm. No abdominal no pelvic adenopathy. Reproductive: Partially calcified uterine fibroid.  No adnexal mass. Other: No free fluid or fluid collection. Umbilical hernia is identified measuring 1.5 x 1.0 cm, image 43/7. This contains fat only. Just below the umbilicus is a ventral midline hernia which contains fat measuring 1.4 x 1.3 cm. There is a small amount of low soft tissue stranding within this hernia which may reflect underlying inflammation. Nodule containing gas within ventral abdominal wall subcutaneous fat just below the level of the umbilicus, eccentric to the right. This measures 1.3 cm, image 56/3. Musculoskeletal: No acute or significant osseous findings. IMPRESSION: 1. Exam positive for small fat containing umbilical hernia measuring 1.5 x 1.0 cm. Just below the umbilicus is a small midline fat containing hernia measuring 1.4 x 1.3 cm. This contains a small amount of soft tissue which may reflect underlying inflammation. 2. Within the subcutaneous fat of the ventral abdominal wall, eccentric to the right of midline near the umbilicus is a small gas containing soft tissue nodule measuring 1.3 cm. 3. Moderate to large left pleural effusion with significantly diminished aeration to the left lower lobe. 4. Atelectasis of the right middle  lobe and right lower lobe with asymmetric elevation of right hemidiaphragm. 5.  Aortic Atherosclerosis (ICD10-I70.0). Electronically Signed   By: Kerby Moors M.D.   On: 01/06/2019 14:58   Dg Chest Portable 1 View  Result Date: 01/06/2019 CLINICAL DATA:  Shortness of breath EXAM: PORTABLE CHEST 1 VIEW COMPARISON:  December 29, 2018 FINDINGS: There is airspace consolidation in both lung bases with small pleural effusions bilaterally. Patient is status post mitral and aortic valve replacements. Heart is upper normal in size with pulmonary vascularity normal. There is  aortic atherosclerosis. No bone lesions. No pneumothorax. IMPRESSION: Cardiomegaly with bilateral pleural effusions. There may be a degree of congestive heart failure. There is airspace consolidation in the bases which probably represents pneumonia, although a degree of alveolar edema may be present as well in the bases. Patient is status post aortic and mitral valve replacements. Aortic Atherosclerosis (ICD10-I70.0). No evident adenopathy. Electronically Signed   By: Lowella Grip III M.D.   On: 01/06/2019 15:29   Ir Thoracentesis Asp Pleural Space W/img Guide  Result Date: 01/07/2019 INDICATION: Patient with history of open aortic valve replacement in OR 12/02/2018 by Dr. Marcie Bal, now with dyspnea and bilateral pleural effusions, L>R. Request is made for diagnostic and therapeutic left thoracentesis. EXAM: ULTRASOUND GUIDED DIAGNOSTIC AND THERAPEUTIC LEFT THORACENTESIS MEDICATIONS: 10 mL 1% lidocaine COMPLICATIONS: None immediate. PROCEDURE: An ultrasound guided thoracentesis was thoroughly discussed with the patient and questions answered. The benefits, risks, alternatives and complications were also discussed. The patient understands and wishes to proceed with the procedure. Written consent was obtained. Ultrasound was performed to localize and mark an adequate pocket of fluid in the left chest. The area was then prepped and draped in the normal sterile fashion. 1% Lidocaine was used for local anesthesia. Under ultrasound guidance a 6 Fr Safe-T-Centesis catheter was introduced. Thoracentesis was performed. The catheter was removed and a dressing applied. FINDINGS: A total of approximately 900 mL of dark red fluid was removed. Procedure was stopped after 900 mL due to patient's symptoms of coughing and chest pain. Samples were sent to the laboratory as requested by the clinical team. IMPRESSION: Successful ultrasound guided left thoracentesis yielding 900 mL of pleural fluid. Read by: Earley Abide,  PA-C Electronically Signed   By: Aletta Edouard M.D.   On: 01/07/2019 15:37    Scheduled Meds: . amLODipine  10 mg Oral QHS  . calcitRIOL  1.75 mcg Oral QODAY  . Chlorhexidine Gluconate Cloth  6 each Topical Q0600  . cloNIDine  0.2 mg Oral BID  . ferric citrate  840 mg Oral TID WC  . labetalol  300 mg Oral BID  . LORazepam  0.5 mg Intravenous Once  . multivitamin  1 tablet Oral QHS  . pantoprazole  40 mg Oral BID  . sodium chloride flush  3 mL Intravenous Once  . sodium chloride flush  3 mL Intravenous Q12H   Continuous Infusions: . sodium chloride    . 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

## 2019-01-08 NOTE — Progress Notes (Signed)
ANTICOAGULATION CONSULT NOTE - Follow Up Consult  Pharmacy Consult for Heparin Indication:  h/o aortic and mitral valve replacement with mechanical valves  No Known Allergies  Patient Measurements: Height: 5\' 6"  (167.6 cm) Weight: 136 lb 14.5 oz (62.1 kg) IBW/kg (Calculated) : 59.3 Heparin Dosing Weight: 62.1 kg  Vital Signs: Temp: 98.8 F (37.1 C) (08/20 0600) Temp Source: Oral (08/20 0600) BP: 144/88 (08/20 0600) Pulse Rate: 91 (08/20 0600)  Labs: Recent Labs    01/06/19 0629 01/06/19 0933 01/07/19 0650 01/07/19 0931 01/08/19 0330 01/08/19 1113  HGB 7.4*  --  7.2*  --  7.0*  --   HCT 25.1*  --  23.1*  --  23.7*  --   PLT 234  --  233  --  211  --   LABPROT  --  19.7*  --  21.2* 21.7*  --   INR  --  1.7*  --  1.9* 1.9*  --   HEPARINUNFRC  --   --   --   --  0.17* 0.20*  CREATININE 5.86*  --  7.55*  --  5.01*  --     Estimated Creatinine Clearance: 11.7 mL/min (A) (by C-G formula based on SCr of 5.01 mg/dL (H)).   Assessment:  Anticoag:  Warfarin PTA, for recent h/o aortic and mitral valve replacement with mechanical valves (12/02/18) Goal INR per anticog clinic is 2.5-3.5 8/18 admit INR 1.7, CT reveals moderate to large left pleural effusion, - 8/19: S/p left thoracentesis Yielded 900 liters of dark red fluid>>>  start IV heparin 4 hours post procedure.,  Target HL 0.3-0.5 (due to dark red fluid s/p thoracentesis 8/19) - INR 1.9. Hgb 7 relatively stable. Plts WNL. HL 0.17>0.2 remains lower than goal.   Goal of Therapy:  Heparin level 0.3-0.5 units/ml Monitor platelets by anticoagulation protocol: Yes   Plan:  Warfarin HOLD - IV heparin (0.3-0.5) due to thoracentesis yielded dark red fluid.   - Increase IV heparin to 900 units/hr. Recheck in 6 hrs. Daily HL, CBC, INR    Yohannes Waibel S. Alford Highland, PharmD, [place] Clinical Staff Pharmacist Eilene Ghazi Stillinger 01/08/2019,1:08 PM

## 2019-01-08 NOTE — Progress Notes (Addendum)
      [address]       ,[place] [address]             [phone]      Subjective:   Patient has no specific complaints.  She states she is breathing better since her procedure.    Objective: Vital signs in last 24 hours: Temp:  [98.8 F (37.1 C)-99 F (37.2 C)] 98.8 F (37.1 C) (08/20 0600) Pulse Rate:  [91-103] 91 (08/20 0600) Resp:  [18] 18 (08/19 1124) BP: (137-144)/(76-88) 144/88 (08/20 0600) SpO2:  [97 %-100 %] 97 % (08/20 0600) Weight:  [62.1 kg-62.2 kg] 62.1 kg (08/20 0700)  Intake/Output from previous day: 08/19 0701 - 08/20 0700 In: 54 [I.V.:54] Out: 1736   General appearance: alert, cooperative and no distress Heart: regular rate and rhythm Lungs: diminished breath sounds bibasilar Abdomen: soft, non-tender; bowel sounds normal; no masses,  no organomegaly Extremities: extremities normal, atraumatic, no cyanosis or edema Wound: clean and well healed, no evidence of infection  Lab Results: Recent Labs    01/07/19 0650 01/08/19 0330  WBC 5.5 5.2  HGB 7.2* 7.0*  HCT 23.1* 23.7*  PLT 233 211   BMET:  Recent Labs    01/07/19 0650 01/08/19 0330  NA 130* 134*  K 3.6 4.1  CL 87* 96*  CO2 28 27  GLUCOSE 89 97  BUN 35* 22*  CREATININE 7.55* 5.01*  CALCIUM 9.1 9.0    PT/INR:  Recent Labs    01/08/19 0330  LABPROT 21.7*  INR 1.9*   ABG    Component Value Date/Time   PHART 7.264 (L) 12/02/2018 1829   HCO3 25.8 12/02/2018 1829   TCO2 28 12/02/2018 1829   ACIDBASEDEF 2.0 12/02/2018 1829   O2SAT 95.0 12/02/2018 1829   CBG (last 3)  No results for input(s): GLUCAP in the last 72 hours.  Assessment/Plan:  1. Bilateral Pleural effusions L>R- S/P Thoracentesis yesterday with 900 ml of bloody fluid removed, cultures sent remain negative to date 2. CV- hemodynamically stable, BP okay with ESRD, coumadin has bene restarted, INR is 1.9, with mechanical MV the patients goal range would be 3 3. Pulm- continued left pleural  effusion on exam, have ordered CXR for this morning... hopefully remaining fluid will resolve without intervention... However recurrence is not unlikely as patient just had surgery for double valve replacement, she is on dialysis, and likely has some components of CHF 4. ID- afebrile, no leukocytosis, surgical incisions well healed w/o evidence of infection 5. Dispo- patient stable, breathing better post Thoracentesis... She is scheduled to follow up with Dr. Roxy Manns with repeat CXR on Monday   LOS: 1 day   [name] PA-C [phone]   01/08/2019

## 2019-01-09 ENCOUNTER — Inpatient Hospital Stay (HOSPITAL_COMMUNITY): Payer: Medicare Other

## 2019-01-09 LAB — CBC
HCT: 23.4 % — ABNORMAL LOW (ref 36.0–46.0)
Hemoglobin: 7 g/dL — ABNORMAL LOW (ref 12.0–15.0)
MCH: 29.9 pg (ref 26.0–34.0)
MCHC: 29.9 g/dL — ABNORMAL LOW (ref 30.0–36.0)
MCV: 100 fL (ref 80.0–100.0)
Platelets: 227 10*3/uL (ref 150–400)
RBC: 2.34 MIL/uL — ABNORMAL LOW (ref 3.87–5.11)
RDW: 17 % — ABNORMAL HIGH (ref 11.5–15.5)
WBC: 4.9 10*3/uL (ref 4.0–10.5)
nRBC: 0 % (ref 0.0–0.2)

## 2019-01-09 LAB — BASIC METABOLIC PANEL
Anion gap: 13 (ref 5–15)
BUN: 37 mg/dL — ABNORMAL HIGH (ref 6–20)
CO2: 25 mmol/L (ref 22–32)
Calcium: 9.8 mg/dL (ref 8.9–10.3)
Chloride: 95 mmol/L — ABNORMAL LOW (ref 98–111)
Creatinine, Ser: 7.3 mg/dL — ABNORMAL HIGH (ref 0.44–1.00)
GFR calc Af Amer: 7 mL/min — ABNORMAL LOW (ref >60)
GFR calc non Af Amer: 6 mL/min — ABNORMAL LOW (ref >60)
Glucose, Bld: 101 mg/dL — ABNORMAL HIGH (ref 70–99)
Potassium: 4.5 mmol/L (ref 3.5–5.1)
Sodium: 133 mmol/L — ABNORMAL LOW (ref 135–145)

## 2019-01-09 LAB — HEPARIN LEVEL (UNFRACTIONATED): Heparin Unfractionated: 0.33 IU/mL (ref 0.30–0.70)

## 2019-01-09 LAB — PROTIME-INR
INR: 1.4 — ABNORMAL HIGH (ref 0.8–1.2)
Prothrombin Time: 16.9 seconds — ABNORMAL HIGH (ref 11.4–15.2)

## 2019-01-09 MED ORDER — WARFARIN SODIUM 3 MG PO TABS: 3.0000 mg | ORAL_TABLET | ORAL | 0 refills | Status: DC

## 2019-01-09 MED ORDER — WARFARIN SODIUM 7.5 MG PO TABS: 7.5000 mg | ORAL_TABLET | ORAL | 0 refills | Status: DC

## 2019-01-09 MED ORDER — WARFARIN SODIUM 6 MG PO TABS: 6.0000 mg | ORAL_TABLET | ORAL | 0 refills | Status: DC

## 2019-01-09 MED ORDER — WARFARIN - PHARMACIST DOSING INPATIENT
Freq: Every day | Status: DC
  Administered 2019-01-09: 18:00:00

## 2019-01-09 MED ORDER — ENOXAPARIN SODIUM 60 MG/0.6ML ~~LOC~~ SOLN
60.0000 mg | SUBCUTANEOUS | Status: DC
  Filled 2019-01-09: qty 0.6

## 2019-01-09 MED ORDER — WARFARIN SODIUM 7.5 MG PO TABS
7.5000 mg | ORAL_TABLET | ORAL | Status: AC
  Administered 2019-01-09: 14:00:00 7.5 mg via ORAL
  Filled 2019-01-09: qty 1

## 2019-01-09 MED ORDER — ACETAMINOPHEN 325 MG PO TABS
ORAL_TABLET | ORAL | Status: AC
  Filled 2019-01-09: qty 2

## 2019-01-09 MED ORDER — ENOXAPARIN SODIUM 60 MG/0.6ML ~~LOC~~ SOLN: 60.0000 mg | SUBCUTANEOUS | 0 refills | Status: DC

## 2019-01-09 MED ORDER — WARFARIN SODIUM 3 MG PO TABS: 3.0000 mg | ORAL_TABLET | ORAL | Status: DC

## 2019-01-09 MED ORDER — WARFARIN SODIUM 6 MG PO TABS: 6.0000 mg | ORAL_TABLET | ORAL | Status: DC

## 2019-01-09 MED ORDER — ENOXAPARIN SODIUM 60 MG/0.6ML ~~LOC~~ SOLN: 60.0000 mg | Freq: Two times a day (BID) | SUBCUTANEOUS | Status: DC

## 2019-01-09 MED FILL — WARFARIN SODIUM 7.5 MG TAB: 7.5 | 1 days supply | Qty: 1 | Fill #0

## 2019-01-09 MED FILL — WARFARIN SODIUM 3 MG TABLET: 3 | 14 days supply | Qty: 10 | Fill #0

## 2019-01-09 MED FILL — WARFARIN SODIUM 6 MG TABLET: 6 | 21 days supply | Qty: 10 | Fill #0

## 2019-01-09 MED FILL — ENOXAPARIN 60 MG/0.6 ML SYR: 60 | 7 days supply | Qty: 7 | Fill #0

## 2019-01-09 NOTE — Progress Notes (Signed)
DISCHARGE NOTE [facility] to be discharged Home per MD order. Patient verbalized understanding.  Skin clean, dry and intact without evidence of skin break down, no evidence of skin tears noted. IV catheter discontinued intact. Site without signs and symptoms of complications. Dressing and pressure applied. Pt denies pain at the site currently. No complaints noted.  Patient free of lines, drains, and wounds.   Discharge packet assembled. An After Visit Summary (AVS) was printed and given to the patient at bedside. Patient escorted via wheelchair and discharged to designated family member via private vehicle. All questions and concerns addressed.   Dolores Hoose, RN

## 2019-01-09 NOTE — Progress Notes (Signed)
      [address]       [place] [address]             [phone]      Subjective:  No new complaints.  For dialysis today.  Objective: Vital signs in last 24 hours: Temp:  [98.6 F (37 C)] 98.6 F (37 C) (08/20 1409) Pulse Rate:  [91-96] 96 (08/20 2119) Resp:  [14] 14 (08/20 2119) BP: (137-141)/(73-95) 141/73 (08/20 2119) SpO2:  [97 %-100 %] 100 % (08/20 2119) Weight:  [63.8 kg] 63.8 kg (08/21 0549)  Intake/Output from previous day: 08/20 0701 - 08/21 0700 In: 184.5 [I.V.:184.5] Out: -  Intake/Output this shift: Total I/O In: 44.3 [I.V.:44.3] Out: -   General appearance: alert, cooperative and no distress Heart: regular rate and rhythm Lungs: diminished breath sounds bibasilar Abdomen: soft, non-tender; bowel sounds normal; no masses,  no organomegaly Extremities: extremities normal, atraumatic, no cyanosis or edema Wound: clean and dry  Lab Results: Recent Labs    01/08/19 0330 01/09/19 0248  WBC 5.2 4.9  HGB 7.0* 7.0*  HCT 23.7* 23.4*  PLT 211 227   BMET:  Recent Labs    01/08/19 0330 01/09/19 0248  NA 134* 133*  K 4.1 4.5  CL 96* 95*  CO2 27 25  GLUCOSE 97 101*  BUN 22* 37*  CREATININE 5.01* 7.30*  CALCIUM 9.0 9.8    PT/INR:  Recent Labs    01/09/19 0248  LABPROT 16.9*  INR 1.4*   ABG    Component Value Date/Time   PHART 7.264 (L) 12/02/2018 1829   HCO3 25.8 12/02/2018 1829   TCO2 28 12/02/2018 1829   ACIDBASEDEF 2.0 12/02/2018 1829   O2SAT 95.0 12/02/2018 1829   CBG (last 3)  No results for input(s): GLUCAP in the last 72 hours.  Assessment/Plan:  1. Bilateral pleural effusions- S/p Thoracentesis, CXR remains stable, to get a 2V after dialysis 2. INR 1.4, will continue coumadin at admission dose, will need Lovenox bridge at discharge 3. ID-afebrile, leukocytosis, fluid culture is pending 4. Dispo- patient stable, will follow up in our office in 1 week, okay to d/c from our standpoint, would touch base with  Coumadin clinic in regards to coumadin discharge dosing and Lovenox bridge.  LOS: 2 days    [name] 01/09/2019

## 2019-01-09 NOTE — Discharge Summary (Addendum)
Physician Discharge Summary  [name] VUY:[id number] DOB: July 10, 1960 DOA: 01/06/2019  PCP: Benito Mccreedy, MD  Admit date: 01/06/2019 Discharge date: 01/09/2019  Time spent: 45 minutes  Recommendations for Outpatient Follow-up:  1. Follow up with vascular surgery 1 week as scheduled 2. 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 female, with past medical history significant for end stage renal disease on hemodialysis, MWF, aortic stenosis status post aortic and mitral valve replacement 11/2018, on chronic home oxygen, presented 8/18 with 2 day history of abdominal pain, periumbilical?  Hernia, not associated with nausea, vomiting or diarrhea.  Patient denied fever or chest pains.  Abdominal pain  located at the area of her Lovenox shots with mild nodules noted at the site. Patient reported being more short of breath and work-up in the emergency room showed worsening pleural effusions especially on the left.  Patient had been followed for her pleural effusions periodically as outpatient.  Hospital Course:   Bilateral pleural effusions left>than right. S/p thoracentesis 9/19 on the left, no fluid to drain on right per IR. 975ml removed.    Fluid red in color, WBC 1775, LD 171, gram stain with  moderate WBC. Repeat chest xray with persistent bilateral pleural effusions and bibasilar atelectatic changes noting only slight improvement of the aeration of the lungs from previous image.  Concern for infection. No leukocytosis, max temp 99 non-toxic appearing. Evaluated by vascular surgery and pulmonology who both opine non-toxic and not likely infection. Cultures with no growth today. Oxygen saturation level greater than 90% on 2L. Will discharge on home oxygen as pta. Will follow cultures and forward to vascular if +.   Abdominal pain with probable reducible hernia, periumbilical Can follow-up as outpatient  Status post AVR and MVR 7/14/20complicated by pneumothorax requiring chest tube.She was discharged on home oxygen.Home meds include coumadin and lovenox. INR 1.9 today.  reports having difficulty getting inr therapeutic since surgery.Will discharge with coumadin and lovenox dosed with pharmacy. Follow up with coumadin clinic as pta  ESRD. Dialysis 8/19 and 8/21 maintaining her MWF schedule.  History of SLE Continue on steroids  Hypertension. Fair control. Home meds include amlodipine, clonidine, labetalol. Continue home meds at discharge  chronic diastolic congestive heart failure Remained compensated  Procedures:  Thoracentesis 8/19  Dialysis 8/19 and 8/21  Consultations:  [facility]  [facility] thoracic surgery  IR  [facility]mon[facility]  Discharge Exam: Vitals:   01/09/19 1030 01/09/19 1100  BP: (!) 155/84 (!) 156/85  Pulse: 88 93  Resp:    Temp:    SpO2:      General: awake alert no acute distress Cardiovascular: rrr no mgr no LE edema Respiratory: normal effort BS distant but clear  Discharge Instructions    Allergies as of 01/09/2019   No Known Allergies  Medication List    TAKE these medications   amLODipine 10 MG tablet Commonly known as: NORVASC Take 1 tablet (10 mg total) by mouth at bedtime.   aspirin EC 81 MG  tablet Take 81 mg by mouth at bedtime.   Auryxia 1 GM 210 MG(Fe) tablet Generic drug: ferric citrate Take 840 mg by mouth 3 (three) times daily with meals.   calcitRIOL 0.25 MCG capsule Commonly known as: ROCALTROL Take 7 capsules (1.75 mcg total) by mouth every other day.   cloNIDine 0.2 MG tablet Commonly known as: CATAPRES Take 1 tablet (0.2 mg) by mouth twice daily - on dialysis days take after dialysis and at bedtime, on other days take one in the morning and one at night What changed:   how much to take  how to take this  when to take this   enoxaparin 60 MG/0.6ML injection Commonly known as: LOVENOX Inject 0.6 mLs (60 mg total) into the skin daily for 12 days.   labetalol 300 MG tablet Commonly known as: NORMODYNE Take 1 tablet (300 mg total) by mouth 2 (two) times daily.   multivitamin Tabs tablet Take 1 tablet by mouth at bedtime.   ondansetron 4 MG tablet Commonly known as: ZOFRAN Take 4 mg by mouth every 6 (six) hours as needed for nausea or vomiting.   pantoprazole 40 MG tablet Commonly known as: PROTONIX Take 40 mg by mouth 2 (two) times daily.   predniSONE 10 MG tablet Commonly known as: DELTASONE Take 10 mg by mouth daily with breakfast.   traMADol 50 MG tablet Commonly known as: ULTRAM Take 1 tablet (50 mg total) by mouth every 4 (four) hours as needed for moderate pain.   warfarin 7.5 MG tablet Commonly known as: COUMADIN Take as directed. If you are unsure how to take this medication, talk to your nurse or doctor. Original instructions: Take 1 tablet (7.5 mg total) by mouth now for 1 dose. What changed:   medication strength  how much to take  when to take this   warfarin 3 MG tablet Commonly known as: COUMADIN Take as directed. If you are unsure how to take this medication, talk to your nurse or doctor. Original instructions: Take 1 tablet (3 mg total) by mouth every Tuesday, Thursday, Saturday, and Sunday at 6 PM. Start taking on:  January 10, 2019 What changed: You were already taking a medication with the same name, and this prescription was added. Make sure you understand how and when to take each.   warfarin 6 MG tablet Commonly known as: COUMADIN Take as directed. If you are unsure how to take this medication, talk to your nurse or doctor. Original instructions: Take 1 tablet (6 mg total) by mouth every Monday, Wednesday, and Friday at 6 PM. Start taking on: January 12, 2019 What changed: You were already taking a medication with the same name, and this prescription was added. Make sure you understand how and when to take each.      No Known Allergies    The results of significant diagnostics from this hospitalization (including imaging, microbiology, ancillary and laboratory) are listed below for reference.    Significant Diagnostic Studies: Dg Chest 1 View  Result Date: 01/07/2019 CLINICAL DATA:  58 year old female status post thoracentesis. 900 cc removed from right lung. EXAM: CHEST  1 VIEW COMPARISON:  Chest radiograph dated 01/06/2019 FINDINGS: There are bilateral pleural effusions with associated atelectatic changes of the lung bases. Pneumonia is not excluded. Similar or slightly  improved aeration of the lungs compared to the radiograph of 01/06/2019. No pneumothorax. Stable cardiac silhouette. Median sternotomy wires and mitral and aortic valve replacement. No acute osseous pathology. IMPRESSION: 1. No pneumothorax. 2. Persistent bilateral pleural effusions and bibasilar atelectatic changes. Overall similar or slight improvement of the aeration of the lungs since the prior radiograph. Electronically Signed   By: Anner Crete M.D.   On: 01/07/2019 15:26   Dg Chest 2 View  Result Date: 12/29/2018 CLINICAL DATA:  Status post mitral valve replacement. Shortness of breath. EXAM: CHEST - 2 VIEW COMPARISON:  Radiographs of December 09, 2018. FINDINGS: Stable cardiomediastinal silhouette. Status post mitral valve  repair. No pneumothorax is noted. Mild bibasilar atelectasis is noted with small pleural effusions. Bony thorax is unremarkable. IMPRESSION: Stable mild bibasilar subsegmental atelectasis with small pleural effusions. Aortic Atherosclerosis (ICD10-I70.0). Electronically Signed   By: Marijo Conception M.D.   On: 12/29/2018 14:31   Ct Abdomen Pelvis W Contrast  Result Date: 01/06/2019 CLINICAL DATA:  Umbilical hernia. EXAM: CT ABDOMEN AND PELVIS WITH CONTRAST TECHNIQUE: Multidetector CT imaging of the abdomen and pelvis was performed using the standard protocol following bolus administration of intravenous contrast. CONTRAST:  114mL OMNIPAQUE IOHEXOL 300 MG/ML  SOLN COMPARISON:  11/29/2018 and 05/15/2017 FINDINGS: Lower chest: There is atelectasis involving the right middle lobe and right lower lobe with asymmetric elevation of the right hemidiaphragm. A moderate volume left pleural effusion is identified with near complete atelectasis and consolidation of the left lower lobe. Hepatobiliary: No focal liver abnormality is seen. No gallstones, gallbladder wall thickening, or biliary dilatation. Pancreas: Unremarkable. No pancreatic ductal dilatation or surrounding inflammatory changes. Spleen: Numerous calcifications identified throughout the spleen as noted previously. Adrenals/Urinary Tract: Bilateral kidney atrophy with multiple cysts. No hydronephrosis identified bilaterally. The urinary bladder appears decompressed. Stomach/Bowel: Stomach is nondistended. No bowel dilatation, wall thickening, or inflammation. Distal colonic diverticulosis without acute inflammation. Vascular/Lymphatic: Aortic atherosclerosis. Branch vessel disease. No aneurysm. No abdominal no pelvic adenopathy. Reproductive: Partially calcified uterine fibroid.  No adnexal mass. Other: No free fluid or fluid collection. Umbilical hernia is identified measuring 1.5 x 1.0 cm, image 43/7. This contains fat only. Just below the umbilicus is a  ventral midline hernia which contains fat measuring 1.4 x 1.3 cm. There is a small amount of low soft tissue stranding within this hernia which may reflect underlying inflammation. Nodule containing gas within ventral abdominal wall subcutaneous fat just below the level of the umbilicus, eccentric to the right. This measures 1.3 cm, image 56/3. Musculoskeletal: No acute or significant osseous findings. IMPRESSION: 1. Exam positive for small fat containing umbilical hernia measuring 1.5 x 1.0 cm. Just below the umbilicus is a small midline fat containing hernia measuring 1.4 x 1.3 cm. This contains a small amount of soft tissue which may reflect underlying inflammation. 2. Within the subcutaneous fat of the ventral abdominal wall, eccentric to the right of midline near the umbilicus is a small gas containing soft tissue nodule measuring 1.3 cm. 3. Moderate to large left pleural effusion with significantly diminished aeration to the left lower lobe. 4. Atelectasis of the right middle lobe and right lower lobe with asymmetric elevation of right hemidiaphragm. 5.  Aortic Atherosclerosis (ICD10-I70.0). Electronically Signed   By: Kerby Moors M.D.   On: 01/06/2019 14:58   Dg Chest Port 1 View  Result Date: 01/08/2019 CLINICAL DATA:  Pleural effusion.  Thoracentesis yesterday EXAM: PORTABLE CHEST 1 VIEW COMPARISON:  01/07/2019 FINDINGS: Small bilateral pleural effusions remain. Bibasilar  atelectasis. No pneumothorax. Changes of median sternotomy and valve replacement. IMPRESSION: Continued small bilateral effusions with bibasilar atelectasis. No change. Electronically Signed   By: Rolm Baptise M.D.   On: 01/08/2019 11:37   Dg Chest Portable 1 View  Result Date: 01/06/2019 CLINICAL DATA:  Shortness of breath EXAM: PORTABLE CHEST 1 VIEW COMPARISON:  December 29, 2018 FINDINGS: There is airspace consolidation in both lung bases with small pleural effusions bilaterally. Patient is status post mitral and aortic valve  replacements. Heart is upper normal in size with pulmonary vascularity normal. There is aortic atherosclerosis. No bone lesions. No pneumothorax. IMPRESSION: Cardiomegaly with bilateral pleural effusions. There may be a degree of congestive heart failure. There is airspace consolidation in the bases which probably represents pneumonia, although a degree of alveolar edema may be present as well in the bases. Patient is status post aortic and mitral valve replacements. Aortic Atherosclerosis (ICD10-I70.0). No evident adenopathy. Electronically Signed   By: Lowella Grip III M.D.   On: 01/06/2019 15:29   Ir Thoracentesis Asp Pleural Space W/img Guide  Result Date: 01/07/2019 INDICATION: Patient with history of open aortic valve replacement in OR 12/02/2018 by Dr. Marcie Bal, now with dyspnea and bilateral pleural effusions, L>R. Request is made for diagnostic and therapeutic left thoracentesis. EXAM: ULTRASOUND GUIDED DIAGNOSTIC AND THERAPEUTIC LEFT THORACENTESIS MEDICATIONS: 10 mL 1% lidocaine COMPLICATIONS: None immediate. PROCEDURE: An ultrasound guided thoracentesis was thoroughly discussed with the patient and questions answered. The benefits, risks, alternatives and complications were also discussed. The patient understands and wishes to proceed with the procedure. Written consent was obtained. Ultrasound was performed to localize and mark an adequate pocket of fluid in the left chest. The area was then prepped and draped in the normal sterile fashion. 1% Lidocaine was used for local anesthesia. Under ultrasound guidance a 6 Fr Safe-T-Centesis catheter was introduced. Thoracentesis was performed. The catheter was removed and a dressing applied. FINDINGS: A total of approximately 900 mL of dark red fluid was removed. Procedure was stopped after 900 mL due to patient's symptoms of coughing and chest pain. Samples were sent to the laboratory as requested by the clinical team. IMPRESSION: Successful ultrasound  guided left thoracentesis yielding 900 mL of pleural fluid. Read by: Earley Abide, PA-C Electronically Signed   By: Aletta Edouard M.D.   On: 01/07/2019 15:37    Microbiology: Recent Results (from the past 240 hour(s))  SARS Coronavirus 2 [facility] order, Performed in [facility] hospital lab) Nasopharyngeal Nasopharyngeal Swab     Status: None   Collection Time: 01/06/19  4:23 PM   Specimen: Nasopharyngeal Swab  Result Value Ref Range Status   SARS Coronavirus 2 NEGATIVE NEGATIVE Final    Comment: (NOTE) If result is NEGATIVE SARS-CoV-2 target nucleic acids are NOT DETECTED. The SARS-CoV-2 RNA is generally detectable in upper and lower  respiratory specimens during the acute phase of infection. The lowest  concentration of SARS-CoV-2 viral copies this assay can detect is 250  copies / mL. A negative result does not preclude SARS-CoV-2 infection  and should not be used as the sole basis for treatment or other  patient management decisions.  A negative result may occur with  improper specimen collection / handling, submission of specimen other  than nasopharyngeal swab, presence of viral mutation(s) within the  areas targeted by this assay, and inadequate number of viral copies  (<250 copies / mL). A negative result must be combined with clinical  observations, patient history, and epidemiological information. If result is  POSITIVE SARS-CoV-2 target nucleic acids are DETECTED. The SARS-CoV-2 RNA is generally detectable in upper and lower  respiratory specimens dur ing the acute phase of infection.  Positive  results are indicative of active infection with SARS-CoV-2.  Clinical  correlation with patient history and other diagnostic information is  necessary to determine patient infection status.  Positive results do  not rule out bacterial infection or co-infection with other viruses. If result is PRESUMPTIVE POSTIVE SARS-CoV-2 nucleic acids MAY BE PRESENT.   A presumptive  positive result was obtained on the submitted specimen  and confirmed on repeat testing.  While 2019 novel coronavirus  (SARS-CoV-2) nucleic acids may be present in the submitted sample  additional confirmatory testing may be necessary for epidemiological  and / or clinical management purposes  to differentiate between  SARS-CoV-2 and other Sarbecovirus currently known to infect humans.  If clinically indicated additional testing with an alternate test  methodology [phone]) is advised. The SARS-CoV-2 RNA is generally  detectable in upper and lower respiratory sp ecimens during the acute  phase of infection. The expected result is Negative. Fact Sheet for Patients:  StrictlyIdeas.no Fact Sheet for Healthcare Providers: BankingDealers.co.za This test is not yet approved or cleared by the [place] FDA and has been authorized for detection and/or diagnosis of SARS-CoV-2 by FDA under an Emergency Use Authorization (EUA).  This EUA will remain in effect (meaning this test can be used) for the duration of the COVID-19 declaration under Section 564(b)(1) of the Act, 21 U.S.C. section 360bbb-3(b)(1), unless the authorization is terminated or revoked sooner. Performed at [facility], [place] [address], [place], [place] [address]   Gram stain     Status: None   Collection Time: 01/07/19  3:13 PM   Specimen: Pleural, Right; Pleural Fluid  Result Value Ref Range Status   Specimen Description FLUID RIGHT PLEURAL  Final   Special Requests NONE  Final   Gram Stain   Final    MODERATE WBC PRESENT,BOTH PMN AND MONONUCLEAR NO ORGANISMS SEEN Performed at [facility], [place] [address], [place], [place] [address]    Report Status 01/07/2019 FINAL  Final  Culture, body fluid-bottle     Status: None (Preliminary result)   Collection Time: 01/07/19  3:13 PM   Specimen: Fluid  Result Value Ref Range Status   Specimen Description FLUID RIGHT  PLEURAL  Final   Special Requests BOTTLES DRAWN AEROBIC AND ANAEROBIC 10CC  Final   Culture   Final    NO GROWTH 2 DAYS Performed at [facility], [place] [address] [place], [place] [address]    Report Status PENDING  Incomplete     Labs: Basic Metabolic Panel: Recent Labs  Lab 01/06/19 0629 01/07/19 0650 01/08/19 0330 01/09/19 0248  NA 134* 130* 134* 133*  K 3.4* 3.6 4.1 4.5  CL 89* 87* 96* 95*  CO2 31 28 27 25   GLUCOSE 103* 89 97 101*  BUN 26* 35* 22* 37*  CREATININE 5.86* 7.55* 5.01* 7.30*  CALCIUM 9.0 9.1 9.0 9.8  PHOS  --  6.4*  --   --    Liver Function Tests: Recent Labs  Lab 01/06/19 0629 01/07/19 0650  AST 28  --   ALT 24  --   ALKPHOS 76  --   BILITOT 0.7  --   PROT 6.7  --   ALBUMIN 3.0* 3.0*   No results for input(s): LIPASE, AMYLASE in the last 168 hours. No results for  input(s): AMMONIA in the last 168 hours. CBC: Recent Labs  Lab 01/06/19 0629 01/07/19 0650 01/08/19 0330 01/09/19 0248  WBC 5.7 5.5 5.2 4.9  HGB 7.4* 7.2* 7.0* 7.0*  HCT 25.1* 23.1* 23.7* 23.4*  MCV 102.4* 100.0 103.0* 100.0  PLT 234 233 211 227   Cardiac Enzymes: No results for input(s): CKTOTAL, CKMB, CKMBINDEX, TROPONINI in the last 168 hours. BNP: BNP (last 3 results) No results for input(s): BNP in the last 8760 hours.  ProBNP (last 3 results) No results for input(s): PROBNP in the last 8760 hours.  CBG: No results for input(s): GLUCAP in the last 168 hours.     SignedRadene Gunning NP [facility] 01/09/2019, 11:54 AM

## 2019-01-09 NOTE — Progress Notes (Signed)
ANTICOAGULATION CONSULT NOTE - Follow Up Consult  Pharmacy Consult for Heparin Indication:  h/o aortic and mitral valve replacement with mechanical valves  No Known Allergies  Patient Measurements: Height: 5\' 6"  (167.6 cm) Weight: 140 lb 10.5 oz (63.8 kg) IBW/kg (Calculated) : 59.3 Heparin Dosing Weight: 62.1 kg  Vital Signs: BP: 141/73 (08/20 2119) Pulse Rate: 96 (08/20 2119)  Labs: Recent Labs    01/07/19 0650 01/07/19 0931  01/08/19 0330 01/08/19 1113 01/08/19 1924 01/09/19 0248  HGB 7.2*  --   --  7.0*  --   --  7.0*  HCT 23.1*  --   --  23.7*  --   --  23.4*  PLT 233  --   --  211  --   --  227  LABPROT  --  21.2*  --  21.7*  --   --  16.9*  INR  --  1.9*  --  1.9*  --   --  1.4*  HEPARINUNFRC  --   --    < > 0.17* 0.20* 0.34 0.33  CREATININE 7.55*  --   --  5.01*  --   --  7.30*   < > = values in this interval not displayed.    Estimated Creatinine Clearance: 8.1 mL/min (A) (by C-G formula based on SCr of 7.3 mg/dL (H)).   Assessment:   Anticoag:  Warfarin PTA, for recent h/o mechanical aortic and mitral valve replacement (12/02/18). Goal INR per anticog clinic is 2.5-3.5  - 8/19: S/p left thoracentesis Yielded 900 liters of dark red fluid>>>  start IV heparin 4 hours post procedure.,  Target HL 0.3-0.5 (due to dark red fluid s/p thoracentesis 8/19) - INR 1.4 down. Hgb 7 relatively stable. Plts WNL. HL 0.33 in goal range x 2 today.  - Warfarin PTA dose : 6mg  qMWF and 3mg  qTTSS,  last taken 8/17 with admit INR 1.7   Goal of Therapy:  Heparin level 0.3-0.5 units/ml Monitor platelets by anticoagulation protocol: Yes   Plan:  Warfarin HOLD - IV heparin (0.3-0.5) due to thoracentesis yielded dark red fluid.   - Continue IV heparin at 900 units/hr Daily HL, CBC, INR    Chera Slivka S. Alford Highland, PharmD, BCPS Clinical Staff Pharmacist Eilene Ghazi [place] 01/09/2019,8:39 AM

## 2019-01-09 NOTE — Progress Notes (Addendum)
[facility] Progress Note   Subjective:   Patient seen and examined in HD.  Breathing better. Plan to be d/c today.   Objective Vitals:   01/09/19 0937 01/09/19 1000 01/09/19 1030 01/09/19 1100  BP: (!) 157/85 139/71 (!) 155/84 (!) 156/85  Pulse: 86 87 88 93  Resp:      Temp:      TempSrc:      SpO2:      Weight:      Height:       Physical Exam General:NAD, thin pleasant female Heart:RRR Lungs:+crackles in L bases, nml WOB Abdomen:soft, NTND, +umbilical hernia  Extremities:no LE edema Dialysis Access: RU AVF cannulated   Filed Weights   01/08/19 0700 01/09/19 0549 01/09/19 0920  Weight: 62.1 kg 63.8 kg 60.8 kg    Intake/Output Summary (Last 24 hours) at 01/09/2019 1118 Last data filed at 01/09/2019 0800 Gross per 24 hour  Intake 228.79 ml  Output -  Net 228.79 ml    Additional Objective Labs: Basic Metabolic Panel: Recent Labs  Lab 01/07/19 0650 01/08/19 0330 01/09/19 0248  NA 130* 134* 133*  K 3.6 4.1 4.5  CL 87* 96* 95*  CO2 28 27 25   GLUCOSE 89 97 101*  BUN 35* 22* 37*  CREATININE 7.55* 5.01* 7.30*  CALCIUM 9.1 9.0 9.8  PHOS 6.4*  --   --    Liver Function Tests: Recent Labs  Lab 01/06/19 0629 01/07/19 0650  AST 28  --   ALT 24  --   ALKPHOS 76  --   BILITOT 0.7  --   PROT 6.7  --   ALBUMIN 3.0* 3.0*   No results for input(s): LIPASE, AMYLASE in the last 168 hours. CBC: Recent Labs  Lab 01/06/19 0629 01/07/19 0650 01/08/19 0330 01/09/19 0248  WBC 5.7 5.5 5.2 4.9  HGB 7.4* 7.2* 7.0* 7.0*  HCT 25.1* 23.1* 23.7* 23.4*  MCV 102.4* 100.0 103.0* 100.0  PLT 234 233 211 227   Blood Culture    Component Value Date/Time   SDES FLUID RIGHT PLEURAL 01/07/2019 1513   SDES FLUID RIGHT PLEURAL 01/07/2019 1513   SPECREQUEST NONE 01/07/2019 1513   SPECREQUEST BOTTLES DRAWN AEROBIC AND ANAEROBIC 10CC 01/07/2019 1513   CULT  01/07/2019 1513    NO GROWTH 2 DAYS Performed at [facility] [address] [place],  [place] [address]    REPTSTATUS 01/07/2019 FINAL 01/07/2019 1513   REPTSTATUS PENDING 01/07/2019 1513    Lab Results  Component Value Date   INR 1.4 (H) 01/09/2019   INR 1.9 (H) 01/08/2019   INR 1.9 (H) 01/07/2019   Studies/Results: Dg Chest 1 View  Result Date: 01/07/2019 CLINICAL DATA:  58 year old female status post thoracentesis. 900 cc removed from right lung. EXAM: CHEST  1 VIEW COMPARISON:  Chest radiograph dated 01/06/2019 FINDINGS: There are bilateral pleural effusions with associated atelectatic changes of the lung bases. Pneumonia is not excluded. Similar or slightly improved aeration of the lungs compared to the radiograph of 01/06/2019. No pneumothorax. Stable cardiac silhouette. Median sternotomy wires and mitral and aortic valve replacement. No acute osseous pathology. IMPRESSION: 1. No pneumothorax. 2. Persistent bilateral pleural effusions and bibasilar atelectatic changes. Overall similar or slight improvement of the aeration of the lungs since the prior radiograph. Electronically Signed   By: Anner Crete M.D.   On: 01/07/2019 15:26   Dg Chest Port 1 View  Result Date: 01/08/2019 CLINICAL DATA:  Pleural effusion.  Thoracentesis yesterday EXAM: PORTABLE  CHEST 1 VIEW COMPARISON:  01/07/2019 FINDINGS: Small bilateral pleural effusions remain. Bibasilar atelectasis. No pneumothorax. Changes of median sternotomy and valve replacement. IMPRESSION: Continued small bilateral effusions with bibasilar atelectasis. No change. Electronically Signed   By: Rolm Baptise M.D.   On: 01/08/2019 11:37   Ir Thoracentesis Asp Pleural Space W/img Guide  Result Date: 01/07/2019 INDICATION: Patient with history of open aortic valve replacement in OR 12/02/2018 by Dr. Marcie Bal, now with dyspnea and bilateral pleural effusions, L>R. Request is made for diagnostic and therapeutic left thoracentesis. EXAM: ULTRASOUND GUIDED DIAGNOSTIC AND THERAPEUTIC LEFT THORACENTESIS MEDICATIONS: 10 mL 1% lidocaine  COMPLICATIONS: None immediate. PROCEDURE: An ultrasound guided thoracentesis was thoroughly discussed with the patient and questions answered. The benefits, risks, alternatives and complications were also discussed. The patient understands and wishes to proceed with the procedure. Written consent was obtained. Ultrasound was performed to localize and mark an adequate pocket of fluid in the left chest. The area was then prepped and draped in the normal sterile fashion. 1% Lidocaine was used for local anesthesia. Under ultrasound guidance a 6 Fr Safe-T-Centesis catheter was introduced. Thoracentesis was performed. The catheter was removed and a dressing applied. FINDINGS: A total of approximately 900 mL of dark red fluid was removed. Procedure was stopped after 900 mL due to patient's symptoms of coughing and chest pain. Samples were sent to the laboratory as requested by the clinical team. IMPRESSION: Successful ultrasound guided left thoracentesis yielding 900 mL of pleural fluid. Read by: Earley Abide, PA-C Electronically Signed   By: Aletta Edouard M.D.   On: 01/07/2019 15:37    Medications: . sodium chloride    . ferric gluconate (FERRLECIT/NULECIT) IV     . amLODipine  10 mg Oral QHS  . Chlorhexidine Gluconate Cloth  6 each Topical Q0600  . cinacalcet  90 mg Oral Q M,W,F-HD  . cloNIDine  0.2 mg Oral BID  . darbepoetin (ARANESP) injection - DIALYSIS  200 mcg Intravenous Q Fri-HD  . enoxaparin (LOVENOX) injection  60 mg Subcutaneous Q24H  . ferric citrate  840 mg Oral TID WC  . labetalol  300 mg Oral BID  . LORazepam  0.5 mg Intravenous Once  . multivitamin  1 tablet Oral QHS  . pantoprazole  40 mg Oral BID  . sodium chloride flush  3 mL Intravenous Once  . sodium chloride flush  3 mL Intravenous Q12H  . [START ON 01/10/2019] warfarin  3 mg Oral Q T,Th,S,Su-1800  . [START ON 01/12/2019] warfarin  6 mg Oral Q M,W,F-1800  . warfarin  7.5 mg Oral NOW  . Warfarin - Pharmacist Dosing Inpatient    Does not apply q1800    Dialysis Orders: [facility] MWF 4h 60.5kg 2/2 bath P2 AVF Hep 1800 mircera 225 q 2wks last 8/5 Venofer  Load 100 mg  q hd last dose 01/16/19  sensipar 120 tiw  Assessment/Plan: 1. Large L sided pleural effusion - s/p thoracentesis yielding 981ml -exudative & blood w/1775 WBC and 72% PMNs - plans for CXR post HD today. Cultures are negative.  No signs of infection.  2. ESRD - HD MWF.  HD today per regular schedule  3. Anemia of CKD- hgb 7.0 - stable.  Aranesp 242mcg ordered with HD today. Cont Fe load  4. Secondary hyperparathyroidism - Calcitriol stopped d/t pth <100. Ca in goal. Phos elevated.  Continue binders and sensipar 5. HTN/volume - BP variable. Continue home meds.  Will be under [name] post HD, feels she is losing weight.  Lower dry at d/c. 6. Nutrition - Renal diet w/fluid restrictions.  7. Umbilical hernia - not incarcerated & reducible. CT shows possible underlying inflammation.  To f/u with PCP after d/c 8. S/p AVR/MVR on 12/02/2018: followed by CTS.  On coumadin  Jen Mow, PA-C [facility] Pager: [phone] 01/09/2019,11:18 AM  LOS: 2 days   Pt seen, examined and agree w A/P as above.  [name]  MD 01/09/2019, 12:55 PM

## 2019-01-09 NOTE — Discharge Instructions (Signed)

## 2019-01-09 NOTE — Plan of Care (Signed)
  Problem: Clinical Measurements: Goal: Diagnostic test results will improve Outcome: Progressing   Problem: Pain Managment: Goal: General experience of comfort will improve Outcome: Progressing   

## 2019-01-09 NOTE — Plan of Care (Signed)
  Problem: Education: Goal: Knowledge of General Education information will improve Description: Including pain rating scale, medication(s)/side effects and non-pharmacologic comfort measures Outcome: Adequate for Discharge   Problem: Clinical Measurements: Goal: Respiratory complications will improve Outcome: Adequate for Discharge   Problem: Clinical Measurements: Goal: Will remain free from infection Outcome: Adequate for Discharge

## 2019-01-09 NOTE — Progress Notes (Signed)
Patient refused to complete dialysis treatment prescribed of 4hrs. Dr.Schertz notified.

## 2019-01-09 NOTE — Consult Note (Signed)
   [facility] Inpatient Consult   01/09/2019  [name] Dec 19, 1960 [id number]    Patient was reviewed for potential [facility] Care Management serviceswithherMedicare/NextGenplan; having readmissions within 30 days.Patient has 31% extreme highrisk scorefor unplanned readmissions and hospitalizations.  Per chart review and MD's note on 01/09/19 reveal as follows:   WandaMilesis a very pleasant57 y.o.female,with past medical history significant for end stage renal disease on hemodialysis, MWF, aortic stenosis status post aortic and mitral valve replacement 11/2018, on chronic home oxygen,          presented on 8/18 with 2 day history of abdominal pain, periumbilical?,Hernia, not associated with nausea, vomiting or diarrhea. Patient denied fever or chest pains. Abdominal pain located at the area of her Lovenox shots with mild nodules noted at the site. Patient reported being more short of breath and work-up in the emergency room showed worsening pleural effusions  especially on the left. Patient had been followed for her pleural effusions periodically as outpatient.  Called patient and spoke with herover the hospital room phone. HIPAA verification obtained.  Patient reports that she is discharging home today with walker and home oxygen. Patient reports that she will be staying at her mother's house while recovering, which she did after her heart surgery on 12/02/18 (since her daughter who lives with her works 10-12 hrs/day).  Sheindicated nothaving current needsor barriers with medications (self managed), pharmacy (using [facility].) and transportation/ caregiver being provided by her mother for now. She reports going to dialysis M-W-F [name]) and following diet restrictions. She plans to monitor her weight regularly and record, and aware to call provider of reportable results. She endorses Dr. Benito [name] with [facility] as her primary care provider.   Patientagreed she could benefit fromEMMI General calls to follow-up with herrecoveryafterdischargeto home.  Of note, [facility] Care Management services does not replace or interfere with any services that are arranged by transition of care case management or social work. Transition of care CM notified of patient's concern for equipment at discharge.    For questions and additional information, please contact:  Mitzi Lilja A. Marshelle Bilger, BSN, RN-BC [facility] Liaison Cell: [phone]

## 2019-01-09 NOTE — Progress Notes (Addendum)
ANTICOAGULATION CONSULT NOTE - Initial Consult  Pharmacy Consult for Heparin>>Lovenox, Coumadin Indication:  h/o aortic and mitral valve replacement with mechanical valves  No Known Allergies  Patient Measurements: Height: _0  (167.6 cm) Weight: 140 lb 10.5 oz (63.8 kg) IBW/kg (Calculated) : 59.3  Vital Signs: BP: 141/73 (08/20 2119) Pulse Rate: 96 (08/20 2119)  Labs: Recent Labs    01/07/19 0650 01/07/19 0931  01/08/19 0330 01/08/19 1113 01/08/19 1924 01/09/19 0248  HGB 7.2*  --   --  7.0*  --   --  7.0*  HCT 23.1*  --   --  23.7*  --   --  23.4*  PLT 233  --   --  211  --   --  227  LABPROT  --  21.2*  --  21.7*  --   --  16.9*  INR  --  1.9*  --  1.9*  --   --  1.4*  HEPARINUNFRC  --   --    < > 0.17* 0.20* 0.34 0.33  CREATININE 7.55*  --   --  5.01*  --   --  7.30*   < > = values in this interval not displayed.    Estimated Creatinine Clearance: 8.1 mL/min (A) (by C-G formula based on SCr of 7.3 mg/dL (H)).   Medical History: Past Medical History:  Diagnosis Date  . Anemia   . Aortic stenosis 09/25/2016   Echo 07/24/16: Mod conc LVH, EF 60-65, no RWMA, Gr 2 DDd, bicuspid aortic valve, mild to mod AS (mean 18, peak 38), MAC, mod mitral stenosis (mean 9, peak 19), mild to mod MR, severe LAE, normal RVSF, mild RAE, mild TR  . Arthritis    "joints" (10/23/2017)  . Blood transfusion '08   [facility]; "low HgB" (10/23/2017)  . Chronic diastolic (congestive) heart failure ([place])   . Chronic diastolic CHF (congestive heart failure) ([place])   . Claustrophobia   . Dysfunctional uterine bleeding 12/19/2010  . ESRD (end stage renal disease) on dialysis [facility])    "MWF; [name]." (10/23/2017)  . GERD (gastroesophageal reflux disease)   . Headache   . Heart murmur   . Hemodialysis patient [facility])    right extremity port  . History of hiatal hernia   . Hx of cardiovascular stress test    Lexiscan Myoview 4/16:  Normal stress nuclear study, EF 59%  . Hypertension   . Lupus ([place])    "?  kind" (10/23/2017)  . Mitral stenosis    Echo 4/16:  EF 55-60%, no RWMA, Gr 1 DD, mod MS (mean 9 mmHg), mod LAE, mild RAE, PASP 65, mod to severe TR, trivial eff // Echo 6/19:  Mild LVH, EF 55-60, no RWMA, Gr 2 DD, mild to mod AS (Mean 20), severe MS (mean 17), massive LAE, PASP 48, trivial effusion   . Peptic ulcer disease   . Pneumonia 10/21/2017  . PONV (postoperative nausea and vomiting)   . S/P aortic valve replacement with metallic valve 5/57/3220   21 mm Sorin Carbomedics bileaflet mechanical valve  . S/P mitral valve replacement with metallic valve 2/54/2706   31 mm Sorin Carbomedics Optiform bileaflet mechanical valve  . Stroke [facility])    per patient "they said i had a small stroke but i couldnt even tell"    Assessment:  Anticoag:  Warfarin PTA, for recent h/o mechanical aortic and mitral valve replacement (12/02/18). Goal INR per anticog clinic is 2.5-3.5  - 8/19: S/p left thoracentesis Yielded 900  liters of dark red fluid>>>  start IV heparin 4 hours post procedure.,  Target HL 0.3-0.5 (due to dark red fluid s/p thoracentesis 8/19) - INR 1.4 down. Hgb 7 relatively stable. Plts WNL. HL 0.33 in goal range x 2. Plan discharge with transition back to LMWH + Coumadin until INR in goal.  - Warfarin PTA dose : 70m qMWF and 345mqTTSS,  last taken 8/17 with admit INR 1.7   Goal of Therapy:  INR 2.5-3.5 Monitor platelets by anticoagulation protocol: Yes   Plan:  D/c IV heparin Lovenox 6035mQ ONCE daily with ESRD Coumadin 7.5mg11m x 1 prior to discharge then resume PTA dosing with anticoag clinic f/u until INR 2.5-3.5.   Wendie Diskin S. RobeAlford HighlandarmD, BCPS Clinical Staff Pharmacist [phone] Ghazillinger 01/09/2019,8:58 AM

## 2019-01-10 LAB — HEPATITIS B E ANTIGEN: Hep B E Ag: NEGATIVE

## 2019-01-10 LAB — HEPATITIS B SURFACE ANTIBODY, QUANTITATIVE: Hep B S AB Quant (Post): 65.6 m[IU]/mL (ref >9.9)

## 2019-01-10 LAB — HEPATITIS B CORE ANTIBODY, TOTAL: Hep B Core Total Ab: NEGATIVE

## 2019-01-12 ENCOUNTER — Encounter: Payer: Self-pay | Admitting: Physician Assistant

## 2019-01-12 DIAGNOSIS — M3210 Systemic lupus erythematosus, organ or system involvement unspecified: Secondary | ICD-10-CM | POA: Diagnosis not present

## 2019-01-12 DIAGNOSIS — Z992 Dependence on renal dialysis: Secondary | ICD-10-CM | POA: Diagnosis not present

## 2019-01-12 DIAGNOSIS — N2581 Secondary hyperparathyroidism of renal origin: Secondary | ICD-10-CM | POA: Diagnosis not present

## 2019-01-12 DIAGNOSIS — D509 Iron deficiency anemia, unspecified: Secondary | ICD-10-CM | POA: Diagnosis not present

## 2019-01-12 DIAGNOSIS — N186 End stage renal disease: Secondary | ICD-10-CM | POA: Diagnosis not present

## 2019-01-12 LAB — CULTURE, BODY FLUID W GRAM STAIN -BOTTLE: Culture: NO GROWTH

## 2019-01-12 NOTE — Care Management Important Message (Signed)
Important Message  Patient Details  Name: [name] MRN: [id number] Date of Birth: March 26, 1961   Medicare Important Message Given:  Yes  Im given on 8/21   [name] 01/12/2019, 9:30 AM

## 2019-01-13 ENCOUNTER — Other Ambulatory Visit: Payer: Self-pay | Admitting: Internal Medicine

## 2019-01-13 ENCOUNTER — Encounter (INDEPENDENT_AMBULATORY_CARE_PROVIDER_SITE_OTHER): Payer: Self-pay

## 2019-01-13 ENCOUNTER — Other Ambulatory Visit: Payer: Self-pay

## 2019-01-13 ENCOUNTER — Ambulatory Visit (INDEPENDENT_AMBULATORY_CARE_PROVIDER_SITE_OTHER): Payer: Medicare Other | Admitting: *Deleted

## 2019-01-13 DIAGNOSIS — Z954 Presence of other heart-valve replacement: Secondary | ICD-10-CM

## 2019-01-13 DIAGNOSIS — Z5181 Encounter for therapeutic drug level monitoring: Secondary | ICD-10-CM | POA: Diagnosis not present

## 2019-01-13 LAB — POCT INR: INR: 1.4 — AB (ref 2.0–3.0)

## 2019-01-13 MED ORDER — WARFARIN SODIUM 3 MG PO TABS: 3.0000 mg | ORAL_TABLET | ORAL | 0 refills | Status: DC

## 2019-01-13 NOTE — Patient Instructions (Signed)
Description   Continue Lovenox injections once a day. Take 7.5mg  today and tomorrow, then continue taking 6 mg daily. Recheck INR on 01/19/2019. Call Coumadin clinic with any problems [phone].

## 2019-01-14 DIAGNOSIS — D509 Iron deficiency anemia, unspecified: Secondary | ICD-10-CM | POA: Diagnosis not present

## 2019-01-14 DIAGNOSIS — M3210 Systemic lupus erythematosus, organ or system involvement unspecified: Secondary | ICD-10-CM | POA: Diagnosis not present

## 2019-01-14 DIAGNOSIS — N2581 Secondary hyperparathyroidism of renal origin: Secondary | ICD-10-CM | POA: Diagnosis not present

## 2019-01-14 DIAGNOSIS — N186 End stage renal disease: Secondary | ICD-10-CM | POA: Diagnosis not present

## 2019-01-14 DIAGNOSIS — Z992 Dependence on renal dialysis: Secondary | ICD-10-CM | POA: Diagnosis not present

## 2019-01-16 DIAGNOSIS — N2581 Secondary hyperparathyroidism of renal origin: Secondary | ICD-10-CM | POA: Diagnosis not present

## 2019-01-16 DIAGNOSIS — D509 Iron deficiency anemia, unspecified: Secondary | ICD-10-CM | POA: Diagnosis not present

## 2019-01-16 DIAGNOSIS — M3210 Systemic lupus erythematosus, organ or system involvement unspecified: Secondary | ICD-10-CM | POA: Diagnosis not present

## 2019-01-16 DIAGNOSIS — N186 End stage renal disease: Secondary | ICD-10-CM | POA: Diagnosis not present

## 2019-01-16 DIAGNOSIS — R000 Tachycardia, unspecified: Secondary | ICD-10-CM | POA: Diagnosis not present

## 2019-01-16 DIAGNOSIS — Z992 Dependence on renal dialysis: Secondary | ICD-10-CM | POA: Diagnosis not present

## 2019-01-19 ENCOUNTER — Ambulatory Visit (INDEPENDENT_AMBULATORY_CARE_PROVIDER_SITE_OTHER): Payer: Self-pay | Admitting: Physician Assistant

## 2019-01-19 ENCOUNTER — Ambulatory Visit (INDEPENDENT_AMBULATORY_CARE_PROVIDER_SITE_OTHER): Payer: Medicare Other | Admitting: Pharmacist

## 2019-01-19 ENCOUNTER — Ambulatory Visit
Admission: RE | Admit: 2019-01-19 | Discharge: 2019-01-19 | Disposition: A | Discharge status: Home / Self Care | Payer: Medicare Other | Source: Ambulatory Visit | Attending: Thoracic Surgery (Cardiothoracic Vascular Surgery) | Admitting: Thoracic Surgery (Cardiothoracic Vascular Surgery)

## 2019-01-19 ENCOUNTER — Other Ambulatory Visit: Payer: Self-pay

## 2019-01-19 ENCOUNTER — Other Ambulatory Visit: Payer: Self-pay | Admitting: Thoracic Surgery (Cardiothoracic Vascular Surgery)

## 2019-01-19 VITALS — BP 136/76 | HR 96 | Temp 97.5°F | Resp 20 | Ht 66.0 in | Wt 130.0 lb

## 2019-01-19 DIAGNOSIS — Z954 Presence of other heart-valve replacement: Secondary | ICD-10-CM

## 2019-01-19 DIAGNOSIS — Z5181 Encounter for therapeutic drug level monitoring: Secondary | ICD-10-CM | POA: Diagnosis not present

## 2019-01-19 DIAGNOSIS — Z992 Dependence on renal dialysis: Secondary | ICD-10-CM | POA: Diagnosis not present

## 2019-01-19 DIAGNOSIS — D509 Iron deficiency anemia, unspecified: Secondary | ICD-10-CM | POA: Diagnosis not present

## 2019-01-19 DIAGNOSIS — J90 Pleural effusion, not elsewhere classified: Secondary | ICD-10-CM

## 2019-01-19 DIAGNOSIS — M3210 Systemic lupus erythematosus, organ or system involvement unspecified: Secondary | ICD-10-CM | POA: Diagnosis not present

## 2019-01-19 DIAGNOSIS — N2581 Secondary hyperparathyroidism of renal origin: Secondary | ICD-10-CM | POA: Diagnosis not present

## 2019-01-19 DIAGNOSIS — N186 End stage renal disease: Secondary | ICD-10-CM | POA: Diagnosis not present

## 2019-01-19 LAB — POCT INR: INR: 1.4 — AB (ref 2.0–3.0)

## 2019-01-19 MED ORDER — TRAMADOL HCL 50 MG PO TABS: 50.0000 mg | ORAL_TABLET | Freq: Two times a day (BID) | ORAL | 0 refills | Status: DC | PRN

## 2019-01-19 MED ORDER — ENOXAPARIN SODIUM 60 MG/0.6ML ~~LOC~~ SOLN: 60.0000 mg | SUBCUTANEOUS | 0 refills | Status: DC

## 2019-01-19 NOTE — Patient Instructions (Signed)
You are encouraged to enroll and participate in the outpatient cardiac rehab program beginning as soon as practical.  Endocarditis is a potentially serious infection of heart valves or inside lining of the heart.  It occurs more commonly in patients with diseased heart valves (such as patient's with aortic or mitral valve disease) and in patients who have undergone heart valve repair or replacement.  Certain surgical and dental procedures may put you at risk, such as dental cleaning, other dental procedures, or any surgery involving the respiratory, urinary, gastrointestinal tract, gallbladder or prostate gland.   To minimize your chances for develooping endocarditis, maintain good oral health and seek prompt medical attention for any infections involving the mouth, teeth, gums, skin or urinary tract.    Always notify your doctor or dentist about your underlying heart valve condition before having any invasive procedures. You will need to take antibiotics before certain procedures, including all routine dental cleanings or other dental procedures.  Your cardiologist or dentist should prescribe these antibiotics for you to be taken ahead of time.

## 2019-01-19 NOTE — Progress Notes (Signed)
HPI:  Patient returns for follow up regarding a left pleural effusion and having had a left thoracentesis on 08/19 during hospitalization. The patient has already been seen by [name] PA-C for routine follow up on 08/10. She had complaints of shortness of breath, fatigue, and pain left chest and back at that time. She is on Coumadin for mechanical mitral and aortic valves. She was also put on Lovenox as her INR was sub therpeutic.   Patient then presented to [facility] on 08/18 with pain around umbilicus. INR at that time was 1.7. CT showed umbilical hernia (was reducible), moderate to large left pleural effusion, atelectasis RML and RLL with elevation of the right hemidiaphragm. She was admitted by medicine and IR performed a left thoracentesis on 08/19. 900 cc of dark red fluid was removed. She was discharged in stable condition on 08/21.  Today, the patient is having some incisional and back pain;although less so than previously. She also has shortness of breath if she really exerts herself. She states she has had no pain in her umbilical area since last admission.   Current Outpatient Medications  Medication Sig Dispense Refill  . amLODipine (NORVASC) 10 MG tablet Take 1 tablet (10 mg total) by mouth at bedtime. 30 tablet 4  . aspirin EC 81 MG tablet Take 81 mg by mouth at bedtime.    . calcitRIOL (ROCALTROL) 0.25 MCG capsule Take 7 capsules (1.75 mcg total) by mouth every other day. 14 capsule 0  . cloNIDine (CATAPRES) 0.2 MG tablet Take 1 tablet (0.2 mg) by mouth twice daily - on dialysis days take after dialysis and at bedtime, on other days take one in the morning and one at night (Patient taking differently: Take 0.2 mg by mouth See admin instructions. Take 1 tablet (0.2 mg) by mouth twice daily - on dialysis days take after dialysis and at bedtime, on other days take one in the morning and one at night) 180 tablet 3  . enoxaparin (LOVENOX) 60 MG/0.6ML injection Inject 0.6 mLs  (60 mg total) into the skin daily for 12 days. 7.2 mL 0  . ferric citrate (AURYXIA) 1 GM 210 MG(Fe) tablet Take 840 mg by mouth 3 (three) times daily with meals.     [name] labetalol (NORMODYNE) 300 MG tablet Take 1 tablet (300 mg total) by mouth 2 (two) times daily. 90 tablet 3  . multivitamin (RENA-VIT) TABS tablet Take 1 tablet by mouth at bedtime.     . ondansetron (ZOFRAN) 4 MG tablet Take 4 mg by mouth every 6 (six) hours as needed for nausea or vomiting.    . pantoprazole (PROTONIX) 40 MG tablet Take 40 mg by mouth 2 (two) times daily.     . predniSONE (DELTASONE) 10 MG tablet Take 10 mg by mouth daily with breakfast.    . traMADol (ULTRAM) 50 MG tablet Take 1 tablet (50 mg total) by mouth every 4 (four) hours as needed for moderate pain. 30 tablet 0  . warfarin (COUMADIN) 3 MG tablet TAKE 1 TABLET BY MOUTH EVERY TUESDAY, THURSDAY, SATURDAY AND SUNDAY AT 6:00PM AS DIRECTED 180 tablet 0  . warfarin (COUMADIN) 6 MG tablet Take 1 tablet (6 mg total) by mouth every Monday, Wednesday, and Friday at 6 PM. 10 tablet 0  . warfarin (COUMADIN) 7.5 MG tablet Take 1 tablet (7.5 mg total) by mouth now for 1 dose. 1 tablet 0  Vital Signs: BP 136/76, HR 96, RR 20, Oxygenation 91% on room air  Physical Exam: CV-RRR, sharp valve clicks, no murmur Pulmonary-Clear to auscultation bilaterally Extremities-No LE edema Wound-Clean and dry  Diagnostic Tests: CLINICAL DATA:  Pleural effusion, recent mitral valve surgery  EXAM: CHEST - 2 VIEW  COMPARISON:  01/09/2019  FINDINGS: Slight interval improvement in aeration of the lungs with small persistent bilateral pleural effusions and associated bibasilar atelectasis. No new or focal airspace opacity. Cardiomegaly status post median sternotomy with mitral valve prosthesis.  IMPRESSION: Slight interval improvement in aeration of the lungs with small persistent bilateral pleural effusions and associated bibasilar atelectasis. No new or focal airspace  opacity.   Electronically Signed   By: Eddie Candle M.D.   On: 01/19/2019 15:03  Impression and Plan: Overall, she is continuing to recover from aortic and mitral valve replacements. She has ESRD and had HD earlier today as her HD days are M/W/Fri. She is requesting a refill on Ultram, which I gave her (#20, no refill). There were no other changes made to her medications. She has a small left pleural effusion, which we will follow. She is taking Lovenox and Coumadin, as she has mechanical valves. She has an appointment to have her PT/INR drawn today at 3:45 pm.. She will return to see Dr. Roxy Manns in about 3 weeks with a chest x ray. She was instructed to contact the office if she has any problems or concerns in the interim.    Nani Skillern, PA-C [facility] [phone]

## 2019-01-19 NOTE — Patient Instructions (Addendum)
Description   Continue Lovenox injections once a day. Start taking 2 tablets daily except 3 tablets on Mondays, Wednesdays, and Fridays. Recheck INR in 1 week. Call Coumadin clinic with any problems [phone].

## 2019-01-20 DIAGNOSIS — Z992 Dependence on renal dialysis: Secondary | ICD-10-CM | POA: Diagnosis not present

## 2019-01-20 DIAGNOSIS — N186 End stage renal disease: Secondary | ICD-10-CM | POA: Diagnosis not present

## 2019-01-20 DIAGNOSIS — I120 Hypertensive chronic kidney disease with stage 5 chronic kidney disease or end stage renal disease: Secondary | ICD-10-CM | POA: Diagnosis not present

## 2019-01-21 ENCOUNTER — Telehealth: Payer: Self-pay

## 2019-01-21 DIAGNOSIS — M3210 Systemic lupus erythematosus, organ or system involvement unspecified: Secondary | ICD-10-CM | POA: Diagnosis not present

## 2019-01-21 DIAGNOSIS — N186 End stage renal disease: Secondary | ICD-10-CM | POA: Diagnosis not present

## 2019-01-21 DIAGNOSIS — D631 Anemia in chronic kidney disease: Secondary | ICD-10-CM | POA: Diagnosis not present

## 2019-01-21 DIAGNOSIS — Z992 Dependence on renal dialysis: Secondary | ICD-10-CM | POA: Diagnosis not present

## 2019-01-21 DIAGNOSIS — N2581 Secondary hyperparathyroidism of renal origin: Secondary | ICD-10-CM | POA: Diagnosis not present

## 2019-01-21 DIAGNOSIS — Z23 Encounter for immunization: Secondary | ICD-10-CM | POA: Diagnosis not present

## 2019-01-21 DIAGNOSIS — D509 Iron deficiency anemia, unspecified: Secondary | ICD-10-CM | POA: Diagnosis not present

## 2019-01-21 NOTE — Telephone Encounter (Signed)
The patient has been notified of the result and verbalized understanding.  All questions (if any) were answered. Wilma Flavin, RN 01/21/2019 12:09 PM

## 2019-01-23 DIAGNOSIS — N186 End stage renal disease: Secondary | ICD-10-CM | POA: Diagnosis not present

## 2019-01-23 DIAGNOSIS — M3210 Systemic lupus erythematosus, organ or system involvement unspecified: Secondary | ICD-10-CM | POA: Diagnosis not present

## 2019-01-23 DIAGNOSIS — D509 Iron deficiency anemia, unspecified: Secondary | ICD-10-CM | POA: Diagnosis not present

## 2019-01-23 DIAGNOSIS — N2581 Secondary hyperparathyroidism of renal origin: Secondary | ICD-10-CM | POA: Diagnosis not present

## 2019-01-23 DIAGNOSIS — D631 Anemia in chronic kidney disease: Secondary | ICD-10-CM | POA: Diagnosis not present

## 2019-01-23 DIAGNOSIS — Z992 Dependence on renal dialysis: Secondary | ICD-10-CM | POA: Diagnosis not present

## 2019-01-26 DIAGNOSIS — Z992 Dependence on renal dialysis: Secondary | ICD-10-CM | POA: Diagnosis not present

## 2019-01-26 DIAGNOSIS — M3210 Systemic lupus erythematosus, organ or system involvement unspecified: Secondary | ICD-10-CM | POA: Diagnosis not present

## 2019-01-26 DIAGNOSIS — N2581 Secondary hyperparathyroidism of renal origin: Secondary | ICD-10-CM | POA: Diagnosis not present

## 2019-01-26 DIAGNOSIS — D631 Anemia in chronic kidney disease: Secondary | ICD-10-CM | POA: Diagnosis not present

## 2019-01-26 DIAGNOSIS — D509 Iron deficiency anemia, unspecified: Secondary | ICD-10-CM | POA: Diagnosis not present

## 2019-01-26 DIAGNOSIS — N186 End stage renal disease: Secondary | ICD-10-CM | POA: Diagnosis not present

## 2019-01-27 ENCOUNTER — Ambulatory Visit (INDEPENDENT_AMBULATORY_CARE_PROVIDER_SITE_OTHER): Payer: Medicare Other | Admitting: *Deleted

## 2019-01-27 ENCOUNTER — Other Ambulatory Visit: Payer: Self-pay

## 2019-01-27 DIAGNOSIS — Z954 Presence of other heart-valve replacement: Secondary | ICD-10-CM | POA: Diagnosis not present

## 2019-01-27 DIAGNOSIS — Z5181 Encounter for therapeutic drug level monitoring: Secondary | ICD-10-CM

## 2019-01-27 LAB — POCT INR: INR: 1.6 — AB (ref 2.0–3.0)

## 2019-01-27 MED ORDER — ENOXAPARIN SODIUM 60 MG/0.6ML ~~LOC~~ SOLN: 60.0000 mg | SUBCUTANEOUS | 1 refills | Status: DC

## 2019-01-27 NOTE — Patient Instructions (Addendum)
Description   Continue Lovenox injections once a day. Today take 3 tablets and tomorrow take 4 tablets then start taking 3 tablets daily except 2 tablets on Tuesdays. Recheck INR in 1 week. Call Coumadin clinic with any problems [phone].

## 2019-01-28 DIAGNOSIS — D631 Anemia in chronic kidney disease: Secondary | ICD-10-CM | POA: Diagnosis not present

## 2019-01-28 DIAGNOSIS — N186 End stage renal disease: Secondary | ICD-10-CM | POA: Diagnosis not present

## 2019-01-28 DIAGNOSIS — N2581 Secondary hyperparathyroidism of renal origin: Secondary | ICD-10-CM | POA: Diagnosis not present

## 2019-01-28 DIAGNOSIS — Z992 Dependence on renal dialysis: Secondary | ICD-10-CM | POA: Diagnosis not present

## 2019-01-28 DIAGNOSIS — D509 Iron deficiency anemia, unspecified: Secondary | ICD-10-CM | POA: Diagnosis not present

## 2019-01-28 DIAGNOSIS — M3210 Systemic lupus erythematosus, organ or system involvement unspecified: Secondary | ICD-10-CM | POA: Diagnosis not present

## 2019-01-30 DIAGNOSIS — M3210 Systemic lupus erythematosus, organ or system involvement unspecified: Secondary | ICD-10-CM | POA: Diagnosis not present

## 2019-01-30 DIAGNOSIS — N2581 Secondary hyperparathyroidism of renal origin: Secondary | ICD-10-CM | POA: Diagnosis not present

## 2019-01-30 DIAGNOSIS — D631 Anemia in chronic kidney disease: Secondary | ICD-10-CM | POA: Diagnosis not present

## 2019-01-30 DIAGNOSIS — N186 End stage renal disease: Secondary | ICD-10-CM | POA: Diagnosis not present

## 2019-01-30 DIAGNOSIS — Z992 Dependence on renal dialysis: Secondary | ICD-10-CM | POA: Diagnosis not present

## 2019-01-30 DIAGNOSIS — D509 Iron deficiency anemia, unspecified: Secondary | ICD-10-CM | POA: Diagnosis not present

## 2019-02-02 DIAGNOSIS — Z992 Dependence on renal dialysis: Secondary | ICD-10-CM | POA: Diagnosis not present

## 2019-02-02 DIAGNOSIS — D631 Anemia in chronic kidney disease: Secondary | ICD-10-CM | POA: Diagnosis not present

## 2019-02-02 DIAGNOSIS — D509 Iron deficiency anemia, unspecified: Secondary | ICD-10-CM | POA: Diagnosis not present

## 2019-02-02 DIAGNOSIS — N2581 Secondary hyperparathyroidism of renal origin: Secondary | ICD-10-CM | POA: Diagnosis not present

## 2019-02-02 DIAGNOSIS — M3210 Systemic lupus erythematosus, organ or system involvement unspecified: Secondary | ICD-10-CM | POA: Diagnosis not present

## 2019-02-02 DIAGNOSIS — N186 End stage renal disease: Secondary | ICD-10-CM | POA: Diagnosis not present

## 2019-02-03 ENCOUNTER — Ambulatory Visit (INDEPENDENT_AMBULATORY_CARE_PROVIDER_SITE_OTHER): Payer: Medicare Other | Admitting: *Deleted

## 2019-02-03 ENCOUNTER — Other Ambulatory Visit: Payer: Self-pay

## 2019-02-03 DIAGNOSIS — Z5181 Encounter for therapeutic drug level monitoring: Secondary | ICD-10-CM | POA: Diagnosis not present

## 2019-02-03 DIAGNOSIS — Z954 Presence of other heart-valve replacement: Secondary | ICD-10-CM | POA: Diagnosis not present

## 2019-02-03 LAB — POCT INR: INR: 2.3 (ref 2.0–3.0)

## 2019-02-03 NOTE — Patient Instructions (Signed)
Description   Finish last 2 Lovenox injections. Today and tomorrow take 4 tablets, then start taking 3 tablets daily except 4 tablets on Sundays. Recheck INR in 1 week. Call Coumadin clinic with any problems [phone].

## 2019-02-04 DIAGNOSIS — Z992 Dependence on renal dialysis: Secondary | ICD-10-CM | POA: Diagnosis not present

## 2019-02-04 DIAGNOSIS — M3210 Systemic lupus erythematosus, organ or system involvement unspecified: Secondary | ICD-10-CM | POA: Diagnosis not present

## 2019-02-04 DIAGNOSIS — N186 End stage renal disease: Secondary | ICD-10-CM | POA: Diagnosis not present

## 2019-02-04 DIAGNOSIS — N2581 Secondary hyperparathyroidism of renal origin: Secondary | ICD-10-CM | POA: Diagnosis not present

## 2019-02-04 DIAGNOSIS — D631 Anemia in chronic kidney disease: Secondary | ICD-10-CM | POA: Diagnosis not present

## 2019-02-04 DIAGNOSIS — D509 Iron deficiency anemia, unspecified: Secondary | ICD-10-CM | POA: Diagnosis not present

## 2019-02-05 ENCOUNTER — Ambulatory Visit
Admission: RE | Admit: 2019-02-05 | Discharge: 2019-02-05 | Disposition: A | Discharge status: Home / Self Care | Payer: Medicare Other | Source: Ambulatory Visit | Attending: Thoracic Surgery (Cardiothoracic Vascular Surgery) | Admitting: Thoracic Surgery (Cardiothoracic Vascular Surgery)

## 2019-02-05 ENCOUNTER — Encounter: Payer: Self-pay | Admitting: Thoracic Surgery (Cardiothoracic Vascular Surgery)

## 2019-02-05 ENCOUNTER — Other Ambulatory Visit: Payer: Self-pay

## 2019-02-05 ENCOUNTER — Ambulatory Visit (INDEPENDENT_AMBULATORY_CARE_PROVIDER_SITE_OTHER): Payer: Self-pay | Admitting: Thoracic Surgery (Cardiothoracic Vascular Surgery)

## 2019-02-05 ENCOUNTER — Other Ambulatory Visit: Payer: Self-pay | Admitting: Thoracic Surgery (Cardiothoracic Vascular Surgery)

## 2019-02-05 VITALS — BP 158/84 | HR 88 | Temp 97.5°F | Resp 16 | Ht 66.0 in | Wt 131.2 lb

## 2019-02-05 DIAGNOSIS — Z954 Presence of other heart-valve replacement: Secondary | ICD-10-CM

## 2019-02-05 DIAGNOSIS — J90 Pleural effusion, not elsewhere classified: Secondary | ICD-10-CM

## 2019-02-05 DIAGNOSIS — R0602 Shortness of breath: Secondary | ICD-10-CM | POA: Diagnosis not present

## 2019-02-05 NOTE — Progress Notes (Signed)
HPI:  Patient returns for follow up of pleural effusion.  She is S/P AVR and MVR performed in July.  She was readmitted to the hospital in August for pleural effusion and required Thoracentesis. Currently, the patient states she continues to make progress.  She continues to have shortness of breath especially in the evenings when trying to sleep.  She was on oxygen when she left the hospital but states the Home health agency wouldn't transfer the order to her residence as she was previously staying with her mother at discharge.  She is ambulating without difficulty and her endurance and energy level have improved.  Current Outpatient Medications  Medication Sig Dispense Refill  . amLODipine (NORVASC) 10 MG tablet Take 1 tablet (10 mg total) by mouth at bedtime. 30 tablet 4  . aspirin EC 81 MG tablet Take 81 mg by mouth at bedtime.    . calcitRIOL (ROCALTROL) 0.25 MCG capsule Take 7 capsules (1.75 mcg total) by mouth every other day. 14 capsule 0  . cloNIDine (CATAPRES) 0.2 MG tablet Take 1 tablet (0.2 mg) by mouth twice daily - on dialysis days take after dialysis and at bedtime, on other days take one in the morning and one at night (Patient taking differently: Take 0.2 mg by mouth See admin instructions. Take 1 tablet (0.2 mg) by mouth twice daily - on dialysis days take after dialysis and at bedtime, on other days take one in the morning and one at night) 180 tablet 3  . enoxaparin (LOVENOX) 60 MG/0.6ML injection Inject 0.6 mLs (60 mg total) into the skin daily. 4.2 mL 1  . ferric citrate (AURYXIA) 1 GM 210 MG(Fe) tablet Take 840 mg by mouth 3 (three) times daily with meals.     [name] labetalol (NORMODYNE) 300 MG tablet Take 1 tablet (300 mg total) by mouth 2 (two) times daily. 90 tablet 3  . multivitamin (RENA-VIT) TABS tablet Take 1 tablet by mouth at bedtime.     . ondansetron (ZOFRAN) 4 MG tablet Take 4 mg by mouth every 6 (six) hours as needed for nausea or vomiting.    . pantoprazole (PROTONIX)  40 MG tablet Take 40 mg by mouth 2 (two) times daily.     . traMADol (ULTRAM) 50 MG tablet Take 1 tablet (50 mg total) by mouth every 12 (twelve) hours as needed for moderate pain. 20 tablet 0  . warfarin (COUMADIN) 3 MG tablet TAKE 1 TABLET BY MOUTH EVERY TUESDAY, THURSDAY, SATURDAY AND SUNDAY AT 6:00PM AS DIRECTED 180 tablet 0  . warfarin (COUMADIN) 6 MG tablet Take 1 tablet (6 mg total) by mouth every Monday, Wednesday, and Friday at 6 PM. 10 tablet 0  . warfarin (COUMADIN) 7.5 MG tablet Take 1 tablet (7.5 mg total) by mouth now for 1 dose. 1 tablet 0  . predniSONE (DELTASONE) 10 MG tablet Take 10 mg by mouth daily with breakfast.     No current facility-administered medications for this visit.     Physical Exam:  BP (!) 158/84 (BP Location: Left Arm, Patient Position: Sitting, Cuff Size: Normal)   Pulse 88   Temp (!) 97.5 F (36.4 C)   Resp 16   Ht 5\' 6"  (1.676 m)   Wt 131 lb 3.2 oz (59.5 kg)   LMP 12/05/2010 (LMP Unknown)   SpO2 94% Comment: RA  BMI 21.18 kg/m   Gen: no apparent distress Heart: RRR Lungs:CTA bilaterally Ext: no edema present  Diagnostic Tests:  CXR: no pleural effusions, no pneumothorax,  continued bilateral atelectasis  A/P:  1. H/O of pleural effusion, resolved 2. Hypoxia at night- [facility] have oxygen nightly at 2L.[name] instructed patient to follow up with PCP who will need to monitor and prescribe further prescriptions if oxygen use would be permanent 3. RTC in October with Dr. Roxy Manns for 3 month follow up from surgery.  She is to follow up with Cardiology who should obtain Echocardiogram in the future   [name], PA-C [facility] [phone]

## 2019-02-06 DIAGNOSIS — N2581 Secondary hyperparathyroidism of renal origin: Secondary | ICD-10-CM | POA: Diagnosis not present

## 2019-02-06 DIAGNOSIS — D631 Anemia in chronic kidney disease: Secondary | ICD-10-CM | POA: Diagnosis not present

## 2019-02-06 DIAGNOSIS — N186 End stage renal disease: Secondary | ICD-10-CM | POA: Diagnosis not present

## 2019-02-06 DIAGNOSIS — M3210 Systemic lupus erythematosus, organ or system involvement unspecified: Secondary | ICD-10-CM | POA: Diagnosis not present

## 2019-02-06 DIAGNOSIS — D509 Iron deficiency anemia, unspecified: Secondary | ICD-10-CM | POA: Diagnosis not present

## 2019-02-06 DIAGNOSIS — Z992 Dependence on renal dialysis: Secondary | ICD-10-CM | POA: Diagnosis not present

## 2019-02-09 DIAGNOSIS — D509 Iron deficiency anemia, unspecified: Secondary | ICD-10-CM | POA: Diagnosis not present

## 2019-02-09 DIAGNOSIS — N186 End stage renal disease: Secondary | ICD-10-CM | POA: Diagnosis not present

## 2019-02-09 DIAGNOSIS — Z992 Dependence on renal dialysis: Secondary | ICD-10-CM | POA: Diagnosis not present

## 2019-02-09 DIAGNOSIS — M3210 Systemic lupus erythematosus, organ or system involvement unspecified: Secondary | ICD-10-CM | POA: Diagnosis not present

## 2019-02-09 DIAGNOSIS — N2581 Secondary hyperparathyroidism of renal origin: Secondary | ICD-10-CM | POA: Diagnosis not present

## 2019-02-09 DIAGNOSIS — D631 Anemia in chronic kidney disease: Secondary | ICD-10-CM | POA: Diagnosis not present

## 2019-02-10 ENCOUNTER — Other Ambulatory Visit: Payer: Self-pay

## 2019-02-10 ENCOUNTER — Ambulatory Visit (INDEPENDENT_AMBULATORY_CARE_PROVIDER_SITE_OTHER): Payer: Medicare Other | Admitting: *Deleted

## 2019-02-10 DIAGNOSIS — Z5181 Encounter for therapeutic drug level monitoring: Secondary | ICD-10-CM | POA: Diagnosis not present

## 2019-02-10 DIAGNOSIS — Z954 Presence of other heart-valve replacement: Secondary | ICD-10-CM | POA: Diagnosis not present

## 2019-02-10 LAB — POCT INR: INR: 3.9 — AB (ref 2.0–3.0)

## 2019-02-10 NOTE — Patient Instructions (Signed)
Description   Hold today,  then continue taking 3 tablets daily except 4 tablets on Sundays. Recheck INR in 1 week. Call Coumadin clinic with any problems [phone].

## 2019-02-11 DIAGNOSIS — M3210 Systemic lupus erythematosus, organ or system involvement unspecified: Secondary | ICD-10-CM | POA: Diagnosis not present

## 2019-02-11 DIAGNOSIS — Z992 Dependence on renal dialysis: Secondary | ICD-10-CM | POA: Diagnosis not present

## 2019-02-11 DIAGNOSIS — N186 End stage renal disease: Secondary | ICD-10-CM | POA: Diagnosis not present

## 2019-02-11 DIAGNOSIS — D631 Anemia in chronic kidney disease: Secondary | ICD-10-CM | POA: Diagnosis not present

## 2019-02-11 DIAGNOSIS — D509 Iron deficiency anemia, unspecified: Secondary | ICD-10-CM | POA: Diagnosis not present

## 2019-02-11 DIAGNOSIS — N2581 Secondary hyperparathyroidism of renal origin: Secondary | ICD-10-CM | POA: Diagnosis not present

## 2019-02-13 DIAGNOSIS — M3210 Systemic lupus erythematosus, organ or system involvement unspecified: Secondary | ICD-10-CM | POA: Diagnosis not present

## 2019-02-13 DIAGNOSIS — D631 Anemia in chronic kidney disease: Secondary | ICD-10-CM | POA: Diagnosis not present

## 2019-02-13 DIAGNOSIS — Z992 Dependence on renal dialysis: Secondary | ICD-10-CM | POA: Diagnosis not present

## 2019-02-13 DIAGNOSIS — N2581 Secondary hyperparathyroidism of renal origin: Secondary | ICD-10-CM | POA: Diagnosis not present

## 2019-02-13 DIAGNOSIS — D509 Iron deficiency anemia, unspecified: Secondary | ICD-10-CM | POA: Diagnosis not present

## 2019-02-13 DIAGNOSIS — N186 End stage renal disease: Secondary | ICD-10-CM | POA: Diagnosis not present

## 2019-02-16 DIAGNOSIS — D509 Iron deficiency anemia, unspecified: Secondary | ICD-10-CM | POA: Diagnosis not present

## 2019-02-16 DIAGNOSIS — D631 Anemia in chronic kidney disease: Secondary | ICD-10-CM | POA: Diagnosis not present

## 2019-02-16 DIAGNOSIS — N2581 Secondary hyperparathyroidism of renal origin: Secondary | ICD-10-CM | POA: Diagnosis not present

## 2019-02-16 DIAGNOSIS — M3210 Systemic lupus erythematosus, organ or system involvement unspecified: Secondary | ICD-10-CM | POA: Diagnosis not present

## 2019-02-16 DIAGNOSIS — N186 End stage renal disease: Secondary | ICD-10-CM | POA: Diagnosis not present

## 2019-02-16 DIAGNOSIS — Z992 Dependence on renal dialysis: Secondary | ICD-10-CM | POA: Diagnosis not present

## 2019-02-17 ENCOUNTER — Other Ambulatory Visit: Payer: Self-pay

## 2019-02-17 ENCOUNTER — Ambulatory Visit (INDEPENDENT_AMBULATORY_CARE_PROVIDER_SITE_OTHER): Payer: Medicare Other | Admitting: *Deleted

## 2019-02-17 DIAGNOSIS — Z5181 Encounter for therapeutic drug level monitoring: Secondary | ICD-10-CM

## 2019-02-17 DIAGNOSIS — Z954 Presence of other heart-valve replacement: Secondary | ICD-10-CM

## 2019-02-17 LAB — POCT INR: INR: 3.4 — AB (ref 2.0–3.0)

## 2019-02-17 MED ORDER — WARFARIN SODIUM 3 MG PO TABS: ORAL_TABLET | ORAL | 0 refills | Status: DC

## 2019-02-17 NOTE — Patient Instructions (Signed)
Description   Continue taking 3 tablets daily except 4 tablets on Sundays. Recheck INR in 10 days. Call Coumadin clinic with any problems [phone].

## 2019-02-18 DIAGNOSIS — Z992 Dependence on renal dialysis: Secondary | ICD-10-CM | POA: Diagnosis not present

## 2019-02-18 DIAGNOSIS — N186 End stage renal disease: Secondary | ICD-10-CM | POA: Diagnosis not present

## 2019-02-18 DIAGNOSIS — N2581 Secondary hyperparathyroidism of renal origin: Secondary | ICD-10-CM | POA: Diagnosis not present

## 2019-02-18 DIAGNOSIS — D509 Iron deficiency anemia, unspecified: Secondary | ICD-10-CM | POA: Diagnosis not present

## 2019-02-18 DIAGNOSIS — M3210 Systemic lupus erythematosus, organ or system involvement unspecified: Secondary | ICD-10-CM | POA: Diagnosis not present

## 2019-02-18 DIAGNOSIS — D631 Anemia in chronic kidney disease: Secondary | ICD-10-CM | POA: Diagnosis not present

## 2019-02-19 DIAGNOSIS — I120 Hypertensive chronic kidney disease with stage 5 chronic kidney disease or end stage renal disease: Secondary | ICD-10-CM | POA: Diagnosis not present

## 2019-02-19 DIAGNOSIS — Z992 Dependence on renal dialysis: Secondary | ICD-10-CM | POA: Diagnosis not present

## 2019-02-19 DIAGNOSIS — N186 End stage renal disease: Secondary | ICD-10-CM | POA: Diagnosis not present

## 2019-02-20 DIAGNOSIS — N2581 Secondary hyperparathyroidism of renal origin: Secondary | ICD-10-CM | POA: Diagnosis not present

## 2019-02-20 DIAGNOSIS — Z992 Dependence on renal dialysis: Secondary | ICD-10-CM | POA: Diagnosis not present

## 2019-02-20 DIAGNOSIS — D631 Anemia in chronic kidney disease: Secondary | ICD-10-CM | POA: Diagnosis not present

## 2019-02-20 DIAGNOSIS — N186 End stage renal disease: Secondary | ICD-10-CM | POA: Diagnosis not present

## 2019-02-20 DIAGNOSIS — D509 Iron deficiency anemia, unspecified: Secondary | ICD-10-CM | POA: Diagnosis not present

## 2019-02-20 DIAGNOSIS — M3210 Systemic lupus erythematosus, organ or system involvement unspecified: Secondary | ICD-10-CM | POA: Diagnosis not present

## 2019-02-23 DIAGNOSIS — D631 Anemia in chronic kidney disease: Secondary | ICD-10-CM | POA: Diagnosis not present

## 2019-02-23 DIAGNOSIS — Z992 Dependence on renal dialysis: Secondary | ICD-10-CM | POA: Diagnosis not present

## 2019-02-23 DIAGNOSIS — D509 Iron deficiency anemia, unspecified: Secondary | ICD-10-CM | POA: Diagnosis not present

## 2019-02-23 DIAGNOSIS — M3210 Systemic lupus erythematosus, organ or system involvement unspecified: Secondary | ICD-10-CM | POA: Diagnosis not present

## 2019-02-23 DIAGNOSIS — N186 End stage renal disease: Secondary | ICD-10-CM | POA: Diagnosis not present

## 2019-02-23 DIAGNOSIS — N2581 Secondary hyperparathyroidism of renal origin: Secondary | ICD-10-CM | POA: Diagnosis not present

## 2019-02-25 DIAGNOSIS — N186 End stage renal disease: Secondary | ICD-10-CM | POA: Diagnosis not present

## 2019-02-25 DIAGNOSIS — D509 Iron deficiency anemia, unspecified: Secondary | ICD-10-CM | POA: Diagnosis not present

## 2019-02-25 DIAGNOSIS — D631 Anemia in chronic kidney disease: Secondary | ICD-10-CM | POA: Diagnosis not present

## 2019-02-25 DIAGNOSIS — N2581 Secondary hyperparathyroidism of renal origin: Secondary | ICD-10-CM | POA: Diagnosis not present

## 2019-02-25 DIAGNOSIS — Z992 Dependence on renal dialysis: Secondary | ICD-10-CM | POA: Diagnosis not present

## 2019-02-25 DIAGNOSIS — M3210 Systemic lupus erythematosus, organ or system involvement unspecified: Secondary | ICD-10-CM | POA: Diagnosis not present

## 2019-02-26 ENCOUNTER — Ambulatory Visit (INDEPENDENT_AMBULATORY_CARE_PROVIDER_SITE_OTHER): Payer: Medicare Other | Admitting: *Deleted

## 2019-02-26 ENCOUNTER — Other Ambulatory Visit: Payer: Self-pay

## 2019-02-26 ENCOUNTER — Encounter (INDEPENDENT_AMBULATORY_CARE_PROVIDER_SITE_OTHER): Payer: Self-pay

## 2019-02-26 DIAGNOSIS — Z954 Presence of other heart-valve replacement: Secondary | ICD-10-CM | POA: Diagnosis not present

## 2019-02-26 DIAGNOSIS — Z5181 Encounter for therapeutic drug level monitoring: Secondary | ICD-10-CM | POA: Diagnosis not present

## 2019-02-26 LAB — POCT INR: INR: 2.7 (ref 2.0–3.0)

## 2019-02-26 NOTE — Patient Instructions (Signed)
Description   Continue taking 3 tablets daily except 4 tablets on Sundays. Recheck INR in 2 weeks. Call Coumadin clinic with any problems [phone].

## 2019-02-27 DIAGNOSIS — D509 Iron deficiency anemia, unspecified: Secondary | ICD-10-CM | POA: Diagnosis not present

## 2019-02-27 DIAGNOSIS — M3210 Systemic lupus erythematosus, organ or system involvement unspecified: Secondary | ICD-10-CM | POA: Diagnosis not present

## 2019-02-27 DIAGNOSIS — Z992 Dependence on renal dialysis: Secondary | ICD-10-CM | POA: Diagnosis not present

## 2019-02-27 DIAGNOSIS — D631 Anemia in chronic kidney disease: Secondary | ICD-10-CM | POA: Diagnosis not present

## 2019-02-27 DIAGNOSIS — N2581 Secondary hyperparathyroidism of renal origin: Secondary | ICD-10-CM | POA: Diagnosis not present

## 2019-02-27 DIAGNOSIS — N186 End stage renal disease: Secondary | ICD-10-CM | POA: Diagnosis not present

## 2019-03-02 DIAGNOSIS — N186 End stage renal disease: Secondary | ICD-10-CM | POA: Diagnosis not present

## 2019-03-02 DIAGNOSIS — M3210 Systemic lupus erythematosus, organ or system involvement unspecified: Secondary | ICD-10-CM | POA: Diagnosis not present

## 2019-03-02 DIAGNOSIS — D509 Iron deficiency anemia, unspecified: Secondary | ICD-10-CM | POA: Diagnosis not present

## 2019-03-02 DIAGNOSIS — D631 Anemia in chronic kidney disease: Secondary | ICD-10-CM | POA: Diagnosis not present

## 2019-03-02 DIAGNOSIS — N2581 Secondary hyperparathyroidism of renal origin: Secondary | ICD-10-CM | POA: Diagnosis not present

## 2019-03-02 DIAGNOSIS — Z992 Dependence on renal dialysis: Secondary | ICD-10-CM | POA: Diagnosis not present

## 2019-03-04 DIAGNOSIS — M3210 Systemic lupus erythematosus, organ or system involvement unspecified: Secondary | ICD-10-CM | POA: Diagnosis not present

## 2019-03-04 DIAGNOSIS — D631 Anemia in chronic kidney disease: Secondary | ICD-10-CM | POA: Diagnosis not present

## 2019-03-04 DIAGNOSIS — N186 End stage renal disease: Secondary | ICD-10-CM | POA: Diagnosis not present

## 2019-03-04 DIAGNOSIS — D509 Iron deficiency anemia, unspecified: Secondary | ICD-10-CM | POA: Diagnosis not present

## 2019-03-04 DIAGNOSIS — N2581 Secondary hyperparathyroidism of renal origin: Secondary | ICD-10-CM | POA: Diagnosis not present

## 2019-03-04 DIAGNOSIS — Z992 Dependence on renal dialysis: Secondary | ICD-10-CM | POA: Diagnosis not present

## 2019-03-06 DIAGNOSIS — D509 Iron deficiency anemia, unspecified: Secondary | ICD-10-CM | POA: Diagnosis not present

## 2019-03-06 DIAGNOSIS — N186 End stage renal disease: Secondary | ICD-10-CM | POA: Diagnosis not present

## 2019-03-06 DIAGNOSIS — M3210 Systemic lupus erythematosus, organ or system involvement unspecified: Secondary | ICD-10-CM | POA: Diagnosis not present

## 2019-03-06 DIAGNOSIS — D631 Anemia in chronic kidney disease: Secondary | ICD-10-CM | POA: Diagnosis not present

## 2019-03-06 DIAGNOSIS — Z992 Dependence on renal dialysis: Secondary | ICD-10-CM | POA: Diagnosis not present

## 2019-03-06 DIAGNOSIS — N2581 Secondary hyperparathyroidism of renal origin: Secondary | ICD-10-CM | POA: Diagnosis not present

## 2019-03-09 DIAGNOSIS — N2581 Secondary hyperparathyroidism of renal origin: Secondary | ICD-10-CM | POA: Diagnosis not present

## 2019-03-09 DIAGNOSIS — D631 Anemia in chronic kidney disease: Secondary | ICD-10-CM | POA: Diagnosis not present

## 2019-03-09 DIAGNOSIS — D509 Iron deficiency anemia, unspecified: Secondary | ICD-10-CM | POA: Diagnosis not present

## 2019-03-09 DIAGNOSIS — N186 End stage renal disease: Secondary | ICD-10-CM | POA: Diagnosis not present

## 2019-03-09 DIAGNOSIS — M3210 Systemic lupus erythematosus, organ or system involvement unspecified: Secondary | ICD-10-CM | POA: Diagnosis not present

## 2019-03-09 DIAGNOSIS — Z992 Dependence on renal dialysis: Secondary | ICD-10-CM | POA: Diagnosis not present

## 2019-03-09 NOTE — Progress Notes (Signed)
Cardiology Office Note:    Date:  03/10/2019   ID:  [name], DOB 1960-06-30, MRN [id number]  PCP:  Benito Mccreedy, MD  Cardiologist:  Dorris Carnes, MD   Electrophysiologist:  None   Referring MD: Benito Mccreedy, MD   Chief Complaint  Patient presents with   Follow-up    Valvular Heart Disease, s/p mechanical AVR, MVR     History of Present Illness:    [name] is a 58 y.o. 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 Roxy Manns) - Bilateral pleural effusions (L>R) - admx 12/2018 >> s/p L tap - Hx of CVA - Hypertension  - ESRD on dialysis (MWF) - Lupus - Dysfunctional uterine bleeding - Peptic ulcer disease - Anemia   [name] was last seen by Leanor Kail, PA-C in 11/2018.  She returns for follow up.  She is here alone.  She has been doing well. She is slowly feeling like she is getting back to normal.  She still has some chest soreness.  She has not had significant shortness of breath.  She has not had syncope, orthopnea, leg edema.  She has been getting to her dry weight at dialysis.  She has been taken off of Clonidine due to low BP.    Prior CV studies:   The following studies were reviewed today:  Carotid US 11/27/2018 Bilateral ICA 1-39  R/L Cardiac catheterization 07/24/2018 Conclusion: 1. No significant CAD.  2. Mildly elevated PCWP.  3. Preserved cardiac output.  4. 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  5. Severe mitral stenosis.  6. Moderate aortic stenosis.   LAD irregs LCx irregs RCA irregs RA mean 4 RV 74/3 PA 64/21, mean 42 PCWP mean 19 LV 169/10 AO 158/73  Oxygen saturations: PA 67% AO 99%  Cardiac Output (Fick) 6.15  Cardiac Index (Fick) 3.64 PVR 3.7 WU  Cardiac Output (Thermo) 7.15 Cardiac Index (Thermo) 4.23  PVR 3.2 WU  Mitral valve:  Mean gradient 22 mmHg MVA (Fick)  1.38 cm^2 MVA (Thermo) 1.6 cm^2  Aortic valve:  Mean gradient 10 mmHg, AVA 1.73 cm^2 (simultaneous FA/LV) Mean gradient 24 mmHg, AVA 1.41 cm^2 (pull back)    Echocardiogram 07/03/2018 EF 60-65, mild LVH, pseudonormalization (Gr 2 DD), normal RVSF, mod to severe MAC, mild to mod MR, severe MS (mean 21), mod AS (mean 34), severe LAE, PASP 34, trivial eff  TEE 01/02/18 Mild LVH, EF 55-60, no RWMA, functionally bicuspid AoV, mod AS (mean 26), mild to mod MR, mod MS (mean 16), mild LAE, mildly reduced RVSF  Echocardiogram 11/05/17 EF 55-60, no RWMA, Gr 2 DD, mild mod AS, severe MS, massive LAE, PASP 48, trivial eff   Abd CTA 05/15/17 IMPRESSION: 1. Thickening of the walls of the majority of the colon, particularly prominent within the left colon, raising the possibility of a diffuse colitis of infectious or inflammatory nature. There is, however, no pericolonic fluid seen to confirm an acute colitis. Alternatively, this thickening could be chronic related to the underlying diverticulosis. Of note, there are no focal inflammatory changes to suggest acute diverticulitis at this time. No bowel obstruction. 2. Aortic atherosclerosis. 3. Additional chronic/incidental findings detailed above.  Echo 07/24/16 Mod conc LVH, EF 60-65, noRWMA,Gr2DDd,bicuspid aortic valve, mild to modAS(mean 18, peak 38), MAC, mod mitral stenosis (mean 9, peak 19), mild tomodMR, severe LAE, normal RVSF, mild RAE, mild TR  Echo 05/12/15 EF 55-60,  normal wall motion, grade 2 diastolic dysfunction, mild AS (mean 14, peak 29),moderate to severe MAC, mild mitral stenosis (mean 5, peak 9), severe LAE  Myoview 4/16 EF 59, no ischemia  Echo 3/16 Mod LVH, EF 55-60, no RWMA, Gr 1 DD, mod MS (mean 9), mod LAE, mild RAE, mod to severe TR, PASP 65, trivial pericardial eff  Past Medical History:  Diagnosis Date   Anemia    Aortic stenosis 09/25/2016   Echo 07/24/16: Mod conc LVH, EF 60-65, no RWMA, Gr 2 DDd,  bicuspid aortic valve, mild to mod AS (mean 18, peak 38), MAC, mod mitral stenosis (mean 9, peak 19), mild to mod MR, severe LAE, normal RVSF, mild RAE, mild TR   Arthritis    "joints" (10/23/2017)   Blood transfusion '08   [facility]; "low HgB" (10/23/2017)   Chronic diastolic (congestive) heart failure (HCC)    Chronic diastolic CHF (congestive heart failure) ([place])    Claustrophobia    Dysfunctional uterine bleeding 12/19/2010   ESRD (end stage renal disease) on dialysis ([place])    "MWF; [name]." (10/23/2017)   GERD (gastroesophageal reflux disease)    Headache    Heart murmur    Hemodialysis patient ([place])    right extremity port   History of hiatal hernia    Hx of cardiovascular stress test    Lexiscan Myoview 4/16:  Normal stress nuclear study, EF 59%   Hypertension    Lupus ([place])    "? kind" (10/23/2017)   Mitral stenosis    Echo 4/16:  EF 55-60%, no RWMA, Gr 1 DD, mod MS (mean 9 mmHg), mod LAE, mild RAE, PASP 65, mod to severe TR, trivial eff // Echo 6/19:  Mild LVH, EF 55-60, no RWMA, Gr 2 DD, mild to mod AS (Mean 20), severe MS (mean 17), massive LAE, PASP 48, trivial effusion    Peptic ulcer disease    Pneumonia 10/21/2017   PONV (postoperative nausea and vomiting)    S/P aortic valve replacement with metallic valve 5/46/5035   21 mm [employer] bileaflet mechanical valve   S/P mitral valve replacement with metallic valve 4/65/6812   31 mm [employer] Optiform bileaflet mechanical valve   Stroke ([place])    per patient "they said i had a small stroke but i couldnt even tell"   Surgical Hx: The patient  has a past surgical history that includes Dialysis fistula creation (2007); Esophagogastroduodenoscopy (N/A, 07/31/2014); Endometrial ablation; Shoulder open rotator cuff repair (Right, 10/09/2016); Colonoscopy w/ biopsies and polypectomy; Revison of arteriovenous fistula (Right, 09/26/2017); Insertion of arteriovenous (av) artegraft arm (Right, 09/26/2017); TEE  without cardioversion (N/A, 01/02/2018); Fistulogram (Right, 02/25/2018); Revison of arteriovenous fistula (Right, 02/25/2018); AV fistula placement (Right, 02/25/2018); Hematoma evacuation (Right, 03/06/2018); Insertion of dialysis catheter (Right, 03/06/2018); RIGHT/LEFT HEART CATH AND CORONARY ANGIOGRAPHY (N/A, 07/24/2018); Tubal ligation; Aortic valve replacement (12/02/2018); Aortic valve replacement (N/A, 12/02/2018); Mitral valve replacement (N/A, 12/02/2018); TEE without cardioversion (N/A, 12/02/2018); and IR THORACENTESIS ASP PLEURAL SPACE W/IMG GUIDE (01/07/2019).   Current Medications: Current Meds  Medication Sig   acetaminophen (MAPAP) 325 MG tablet Take 325 mg by mouth as needed.   amLODipine (NORVASC) 10 MG tablet Take 1 tablet (10 mg total) by mouth at bedtime.   aspirin EC 81 MG tablet Take 81 mg by mouth at bedtime.   heparin 1000 UNIT/ML injection Heparin Sodium (Porcine) 1,000 Units/mL Systemic   labetalol (NORMODYNE) 300 MG tablet Take 1 tablet (300 mg total) by mouth 2 (two) times daily.  multivitamin (RENA-VIT) TABS tablet Take 1 tablet by mouth at bedtime.    ondansetron (ZOFRAN) 4 MG tablet Take 4 mg by mouth every 6 (six) hours as needed for nausea or vomiting.   pantoprazole (PROTONIX) 40 MG tablet Take 40 mg by mouth 2 (two) times daily.    temazepam (RESTORIL) 15 MG capsule Take 15 mg by mouth at bedtime.   traMADol (ULTRAM) 50 MG tablet Take 1 tablet (50 mg total) by mouth every 12 (twelve) hours as needed for moderate pain.   warfarin (COUMADIN) 3 MG tablet Take 3 tablets by mouth daily except 4 tablets on Sunday or as directed by coumadin clinic.     Allergies:   Patient has no known allergies.   Social History   Tobacco Use   Smoking status: Former Smoker    Packs/day: 0.10    Years: 10.00    Pack years: 1.00    Types: Cigarettes    Start date: 10/21/2017    Quit date: 11/20/2018    Years since quitting: 0.3   Smokeless tobacco: Never Used  Substance  Use Topics   Alcohol use: Yes    Alcohol/week: 1.0 standard drinks    Types: 1 Cans of beer per week    Comment: occasional beer   Drug use: Yes    Types: Marijuana, Cocaine    Comment: marijuana 11/27/2018     Family Hx: The patient's family history includes Hypertension in her father and mother; Liver cancer in her maternal grandmother; Lymphoma in her maternal aunt; Renal Disease in her father. There is no history of Heart attack.  ROS:   Please see the history of present illness.    ROS All other systems reviewed and are negative.   EKGs/Labs/Other Test Reviewed:    EKG:  EKG is not ordered today.  The ekg ordered today demonstrates n/a  Recent Labs: 12/03/2018: Magnesium 1.9 01/06/2019: ALT 24 01/09/2019: BUN 37; Creatinine, Ser 7.30; Hemoglobin 7.0; Platelets 227; Potassium 4.5; Sodium 133   Recent Lipid Panel Lab Results  Component Value Date/Time   CHOL 187 02/23/2016 10:40 AM   TRIG 89 02/23/2016 10:40 AM   HDL 55 02/23/2016 10:40 AM   CHOLHDL 3.4 02/23/2016 10:40 AM   LDLCALC 114 02/23/2016 10:40 AM    Physical Exam:    VS:  BP 110/70    Pulse 91    Ht _0  (1.676 m)    Wt 134 lb 12.8 oz (61.1 kg)    LMP 12/05/2010 (LMP Unknown)    SpO2 97%    BMI 21.76 kg/m     Wt Readings from Last 3 Encounters:  03/10/19 134 lb 12.8 oz (61.1 kg)  02/05/19 131 lb 3.2 oz (59.5 kg)  01/19/19 130 lb (59 kg)     Physical Exam  Constitutional: She is oriented to person, place, and time. She appears well-developed and well-nourished. No distress.  HENT:  Head: Normocephalic and atraumatic.  Eyes: No scleral icterus.  Neck: No JVD present. No thyromegaly present.  Cardiovascular: Normal rate and regular rhythm.  Murmur heard.  Early systolic murmur is present with a grade of 1/6 at the upper right sternal border. Mechanical S1, mechanical S2  Pulmonary/Chest: Effort normal. She has no rales.  Abdominal: Soft.  Musculoskeletal:        General: No edema.    Lymphadenopathy:    She has no cervical adenopathy.  Neurological: She is alert and oriented to person, place, and time.  Skin: Skin is warm and  dry.  Psychiatric: She has a normal mood and affect.    ASSESSMENT & PLAN:    1. Valvular heart disease 2. S/P aortic valve replacement with metallic valve 3. S/P mitral valve replacement with metallic valve She is doing well post mechanical MVR and mechanical AVR in 11/2018.  She sees Dr. Roxy Manns later this month.  She has not had a follow up echocardiogram since her surgery.  I will arrange a 2D echocardiogram.  Continue ASA, Coumadin.  Continue SBE prophylaxis.    4. Chronic diastolic (congestive) heart failure (HCC) Volume management per dialysis.    5. ESRD (end stage renal disease) on dialysis [facility]) She remain on Mon, Wed, Fri dialysis.    6. Essential hypertension BP controlled.  She has been taken off of Clonidine due to low BP.      Dispo:  Return in about 6 months (around 09/08/2019) for Routine Follow Up, w/ Dr. Harrington Challenger, or Richardson Dopp, PA-C.   Medication Adjustments/Labs and Tests Ordered: Current medicines are reviewed at length with the patient today.  Concerns regarding medicines are outlined above.  Tests Ordered: Orders Placed This Encounter  Procedures   ECHOCARDIOGRAM COMPLETE   Medication Changes: No orders of the defined types were placed in this encounter.   Signed, Richardson Dopp, PA-C  03/10/2019 3:46 PM    [facility] [address], [place], [place]  [address] Phone: [phone]; Fax: [phone]

## 2019-03-10 ENCOUNTER — Other Ambulatory Visit: Payer: Self-pay

## 2019-03-10 ENCOUNTER — Ambulatory Visit (INDEPENDENT_AMBULATORY_CARE_PROVIDER_SITE_OTHER): Payer: Medicare Other | Admitting: Physician Assistant

## 2019-03-10 ENCOUNTER — Ambulatory Visit (INDEPENDENT_AMBULATORY_CARE_PROVIDER_SITE_OTHER): Payer: Medicare Other | Admitting: *Deleted

## 2019-03-10 ENCOUNTER — Encounter: Payer: Self-pay | Admitting: Physician Assistant

## 2019-03-10 VITALS — BP 110/70 | HR 91 | Ht 66.0 in | Wt 134.8 lb

## 2019-03-10 DIAGNOSIS — N186 End stage renal disease: Secondary | ICD-10-CM

## 2019-03-10 DIAGNOSIS — I10 Essential (primary) hypertension: Secondary | ICD-10-CM | POA: Diagnosis not present

## 2019-03-10 DIAGNOSIS — Z5181 Encounter for therapeutic drug level monitoring: Secondary | ICD-10-CM | POA: Diagnosis not present

## 2019-03-10 DIAGNOSIS — Z954 Presence of other heart-valve replacement: Secondary | ICD-10-CM

## 2019-03-10 DIAGNOSIS — Z992 Dependence on renal dialysis: Secondary | ICD-10-CM | POA: Diagnosis not present

## 2019-03-10 DIAGNOSIS — I5032 Chronic diastolic (congestive) heart failure: Secondary | ICD-10-CM | POA: Diagnosis not present

## 2019-03-10 DIAGNOSIS — I38 Endocarditis, valve unspecified: Secondary | ICD-10-CM

## 2019-03-10 LAB — POCT INR: INR: 3.4 — AB (ref 2.0–3.0)

## 2019-03-10 NOTE — Patient Instructions (Signed)
Medication Instructions:  Your physician recommends that you continue on your current medications as directed. Please refer to the Current Medication list given to you today.  *If you need a refill on your cardiac medications before your next appointment, please call your pharmacy*  Lab Work: One ordered  If you have labs (blood work) drawn today and your tests are completely normal, you will receive your results only by: [name] MyChart Message (if you have MyChart) OR . A paper copy in the mail If you have any lab test that is abnormal or we need to change your treatment, we will call you to review the results.  Testing/Procedures: Your physician has requested that you have an echocardiogram 03/12/2019 ARRIVE AT 3:50 FOR THIS STUDY.  Echocardiography is a painless test that uses sound waves to create images of your heart. It provides your doctor with information about the size and shape of your heart and how well your heart's chambers and valves are working. This procedure takes approximately one hour. There are no restrictions for this procedure.    Follow-Up: At [facility], you and your health needs are our priority.  As part of our continuing mission to provide you with exceptional heart care, we have created designated Provider Care Teams.  These Care Teams include your primary Cardiologist (physician) and Advanced Practice Providers (APPs -  Physician Assistants and Nurse Practitioners) who all work together to provide you with the care you need, when you need it.  Your next appointment:   6 months  The format for your next appointment:   Either In Person or Virtual  Provider:   You may see Dorris Carnes, MD or one of the following Advanced Practice Providers on your designated Care Team:    Richardson Dopp, PA-C  [place], [place]  Daune Perch, NP   Other Instructions  Echocardiogram An echocardiogram is a procedure that uses painless sound waves (ultrasound) to produce an  image of the heart. Images from an echocardiogram can provide important information about:  Signs of coronary artery disease (CAD).  Aneurysm detection. An aneurysm is a weak or damaged part of an artery wall that bulges out from the normal force of blood pumping through the body.  Heart size and shape. Changes in the size or shape of the heart can be associated with certain conditions, including heart failure, aneurysm, and CAD.  Heart muscle function.  Heart valve function.  Signs of a past heart attack.  Fluid buildup around the heart.  Thickening of the heart muscle.  A tumor or infectious growth around the heart valves. Tell a health care provider about:  Any allergies you have.  All medicines you are taking, including vitamins, herbs, eye drops, creams, and over-the-counter medicines.  Any blood disorders you have.  Any surgeries you have had.  Any medical conditions you have.  Whether you are pregnant or may be pregnant. What are the risks? Generally, this is a safe procedure. However, problems may occur, including:  Allergic reaction to dye (contrast) that may be used during the procedure. What happens before the procedure? No specific preparation is needed. You may eat and drink normally. What happens during the procedure?   An IV tube may be inserted into one of your veins.  You may receive contrast through this tube. A contrast is an injection that improves the quality of the pictures from your heart.  A gel will be applied to your chest.  A wand-like tool (transducer) will be moved  over your chest. The gel will help to transmit the sound waves from the transducer.  The sound waves will harmlessly bounce off of your heart to allow the heart images to be captured in real-time motion. The images will be recorded on a computer. The procedure may vary among health care providers and hospitals. What happens after the procedure?  You may return to your  normal, everyday life, including diet, activities, and medicines, unless your health care provider tells you not to do that. Summary  An echocardiogram is a procedure that uses painless sound waves (ultrasound) to produce an image of the heart.  Images from an echocardiogram can provide important information about the size and shape of your heart, heart muscle function, heart valve function, and fluid buildup around your heart.  You do not need to do anything to prepare before this procedure. You may eat and drink normally.  After the echocardiogram is completed, you may return to your normal, everyday life, unless your health care provider tells you not to do that. This information is not intended to replace advice given to you by your health care provider. Make sure you discuss any questions you have with your health care provider. Document Released: 05/04/2000 Document Revised: 08/28/2018 Document Reviewed: 06/09/2016 Elsevier Patient Education  2020 [employer].

## 2019-03-10 NOTE — Patient Instructions (Signed)
Description   Continue taking 3 tablets daily except 4 tablets on Sundays. Recheck INR in 3 weeks. Call Coumadin clinic with any problems [phone].

## 2019-03-11 DIAGNOSIS — N186 End stage renal disease: Secondary | ICD-10-CM | POA: Diagnosis not present

## 2019-03-11 DIAGNOSIS — D631 Anemia in chronic kidney disease: Secondary | ICD-10-CM | POA: Diagnosis not present

## 2019-03-11 DIAGNOSIS — Z992 Dependence on renal dialysis: Secondary | ICD-10-CM | POA: Diagnosis not present

## 2019-03-11 DIAGNOSIS — N2581 Secondary hyperparathyroidism of renal origin: Secondary | ICD-10-CM | POA: Diagnosis not present

## 2019-03-11 DIAGNOSIS — D509 Iron deficiency anemia, unspecified: Secondary | ICD-10-CM | POA: Diagnosis not present

## 2019-03-11 DIAGNOSIS — M3210 Systemic lupus erythematosus, organ or system involvement unspecified: Secondary | ICD-10-CM | POA: Diagnosis not present

## 2019-03-12 ENCOUNTER — Other Ambulatory Visit: Payer: Self-pay

## 2019-03-12 ENCOUNTER — Ambulatory Visit (HOSPITAL_COMMUNITY): Payer: Medicare Other | Attending: Cardiology

## 2019-03-12 DIAGNOSIS — Z954 Presence of other heart-valve replacement: Secondary | ICD-10-CM | POA: Diagnosis not present

## 2019-03-13 ENCOUNTER — Encounter: Payer: Self-pay | Admitting: Physician Assistant

## 2019-03-13 DIAGNOSIS — D509 Iron deficiency anemia, unspecified: Secondary | ICD-10-CM | POA: Diagnosis not present

## 2019-03-13 DIAGNOSIS — D631 Anemia in chronic kidney disease: Secondary | ICD-10-CM | POA: Diagnosis not present

## 2019-03-13 DIAGNOSIS — Z992 Dependence on renal dialysis: Secondary | ICD-10-CM | POA: Diagnosis not present

## 2019-03-13 DIAGNOSIS — M3210 Systemic lupus erythematosus, organ or system involvement unspecified: Secondary | ICD-10-CM | POA: Diagnosis not present

## 2019-03-13 DIAGNOSIS — N186 End stage renal disease: Secondary | ICD-10-CM | POA: Diagnosis not present

## 2019-03-13 DIAGNOSIS — N2581 Secondary hyperparathyroidism of renal origin: Secondary | ICD-10-CM | POA: Diagnosis not present

## 2019-03-16 ENCOUNTER — Telehealth: Payer: Self-pay

## 2019-03-16 DIAGNOSIS — M3210 Systemic lupus erythematosus, organ or system involvement unspecified: Secondary | ICD-10-CM | POA: Diagnosis not present

## 2019-03-16 DIAGNOSIS — Z992 Dependence on renal dialysis: Secondary | ICD-10-CM | POA: Diagnosis not present

## 2019-03-16 DIAGNOSIS — N186 End stage renal disease: Secondary | ICD-10-CM | POA: Diagnosis not present

## 2019-03-16 DIAGNOSIS — D509 Iron deficiency anemia, unspecified: Secondary | ICD-10-CM | POA: Diagnosis not present

## 2019-03-16 DIAGNOSIS — D631 Anemia in chronic kidney disease: Secondary | ICD-10-CM | POA: Diagnosis not present

## 2019-03-16 DIAGNOSIS — N2581 Secondary hyperparathyroidism of renal origin: Secondary | ICD-10-CM | POA: Diagnosis not present

## 2019-03-16 NOTE — Telephone Encounter (Signed)
Pt called office on Friday, requesting refill of Tramadol d/t incisional pain. Josie Saunders, PA informed and denies this request, advising pt to take Tylenol or Ibuprofen PRN. Called pt today and notified of this. She verbalizes understanding.

## 2019-03-17 ENCOUNTER — Encounter: Payer: Medicare Other | Admitting: Thoracic Surgery (Cardiothoracic Vascular Surgery)

## 2019-03-18 DIAGNOSIS — N186 End stage renal disease: Secondary | ICD-10-CM | POA: Diagnosis not present

## 2019-03-18 DIAGNOSIS — D631 Anemia in chronic kidney disease: Secondary | ICD-10-CM | POA: Diagnosis not present

## 2019-03-18 DIAGNOSIS — M3210 Systemic lupus erythematosus, organ or system involvement unspecified: Secondary | ICD-10-CM | POA: Diagnosis not present

## 2019-03-18 DIAGNOSIS — Z992 Dependence on renal dialysis: Secondary | ICD-10-CM | POA: Diagnosis not present

## 2019-03-18 DIAGNOSIS — N2581 Secondary hyperparathyroidism of renal origin: Secondary | ICD-10-CM | POA: Diagnosis not present

## 2019-03-18 DIAGNOSIS — D509 Iron deficiency anemia, unspecified: Secondary | ICD-10-CM | POA: Diagnosis not present

## 2019-03-20 DIAGNOSIS — Z992 Dependence on renal dialysis: Secondary | ICD-10-CM | POA: Diagnosis not present

## 2019-03-20 DIAGNOSIS — N186 End stage renal disease: Secondary | ICD-10-CM | POA: Diagnosis not present

## 2019-03-20 DIAGNOSIS — M3210 Systemic lupus erythematosus, organ or system involvement unspecified: Secondary | ICD-10-CM | POA: Diagnosis not present

## 2019-03-20 DIAGNOSIS — D509 Iron deficiency anemia, unspecified: Secondary | ICD-10-CM | POA: Diagnosis not present

## 2019-03-20 DIAGNOSIS — N2581 Secondary hyperparathyroidism of renal origin: Secondary | ICD-10-CM | POA: Diagnosis not present

## 2019-03-20 DIAGNOSIS — D631 Anemia in chronic kidney disease: Secondary | ICD-10-CM | POA: Diagnosis not present

## 2019-03-22 DIAGNOSIS — Z992 Dependence on renal dialysis: Secondary | ICD-10-CM | POA: Diagnosis not present

## 2019-03-22 DIAGNOSIS — N186 End stage renal disease: Secondary | ICD-10-CM | POA: Diagnosis not present

## 2019-03-22 DIAGNOSIS — I120 Hypertensive chronic kidney disease with stage 5 chronic kidney disease or end stage renal disease: Secondary | ICD-10-CM | POA: Diagnosis not present

## 2019-03-23 DIAGNOSIS — N2581 Secondary hyperparathyroidism of renal origin: Secondary | ICD-10-CM | POA: Diagnosis not present

## 2019-03-23 DIAGNOSIS — M3210 Systemic lupus erythematosus, organ or system involvement unspecified: Secondary | ICD-10-CM | POA: Diagnosis not present

## 2019-03-23 DIAGNOSIS — D631 Anemia in chronic kidney disease: Secondary | ICD-10-CM | POA: Diagnosis not present

## 2019-03-23 DIAGNOSIS — D509 Iron deficiency anemia, unspecified: Secondary | ICD-10-CM | POA: Diagnosis not present

## 2019-03-23 DIAGNOSIS — Z992 Dependence on renal dialysis: Secondary | ICD-10-CM | POA: Diagnosis not present

## 2019-03-23 DIAGNOSIS — N186 End stage renal disease: Secondary | ICD-10-CM | POA: Diagnosis not present

## 2019-03-24 ENCOUNTER — Encounter: Payer: Self-pay | Admitting: Thoracic Surgery (Cardiothoracic Vascular Surgery)

## 2019-03-24 ENCOUNTER — Other Ambulatory Visit: Payer: Self-pay

## 2019-03-24 ENCOUNTER — Ambulatory Visit (INDEPENDENT_AMBULATORY_CARE_PROVIDER_SITE_OTHER): Payer: Medicare Other | Admitting: Thoracic Surgery (Cardiothoracic Vascular Surgery)

## 2019-03-24 VITALS — BP 115/75 | HR 90 | Temp 97.6°F | Resp 20 | Ht 66.0 in | Wt 134.0 lb

## 2019-03-24 DIAGNOSIS — Z954 Presence of other heart-valve replacement: Secondary | ICD-10-CM

## 2019-03-24 NOTE — Progress Notes (Signed)
[address]       [place] [address]             [phone]     CARDIOTHORACIC SURGERY OFFICE NOTE  Primary Cardiologist is Dorris Carnes, MD PCP is Benito Mccreedy, MD   HPI:  Patient is a 58 year old African-American female with history of aortic stenosis, mitral stenosis, end-stage renal disease on hemodialysis, hypertension, lupus, remote history of stroke, dysfunctional uterine bleeding, peptic ulcer disease, and chronic anemia who returns the office today for routine follow-up status post aortic and mitral valve replacement using a bileaflet mechanical prosthetic valve on December 02, 2018 for rheumatic heart disease with aortic stenosis and mitral stenosis.  She was last seen here in our office on February 05, 2019 at which time she was recovering fairly well.  Since then she has been seen by Richardson Dopp at [facility] March 10, 2019 after which time she underwent routine follow-up echocardiogram on March 12, 2019.  Echocardiogram revealed normal left ventricular systolic function with normal functioning mechanical prosthetic valve in the aortic and mitral position.  Neither valve had any sign of perivalvular leak.  Mean transvalvular gradient across the aortic valve was estimated 17 to 18 mmHg and the mean transvalvular gradient across the mitral valve was estimated 5 mmHg.  She returns to our office today and reports that she is doing well.  She still has slight soreness from her sternotomy incision but this does not bother her much.  She reports that her breathing is dramatically improved in comparison with how she was doing prior to surgery.  She is quite pleased that she went through with the surgery.  She has not had any problems with anticoagulation using warfarin.  Her most recent INR was therapeutic at 3.4.  She states that dialysis has been going well and overall she is getting along just fine.   Current Outpatient Medications  Medication Sig  Dispense Refill  . amLODipine (NORVASC) 10 MG tablet Take 1 tablet (10 mg total) by mouth at bedtime. 30 tablet 4  . aspirin EC 81 MG tablet Take 81 mg by mouth at bedtime.    . heparin 1000 UNIT/ML injection Heparin Sodium (Porcine) 1,000 Units/mL Systemic    . labetalol (NORMODYNE) 300 MG tablet Take 1 tablet (300 mg total) by mouth 2 (two) times daily. 90 tablet 3  . multivitamin (RENA-VIT) TABS tablet Take 1 tablet by mouth at bedtime.     . ondansetron (ZOFRAN) 4 MG tablet Take 4 mg by mouth every 6 (six) hours as needed for nausea or vomiting.    . pantoprazole (PROTONIX) 40 MG tablet Take 40 mg by mouth 2 (two) times daily.     . temazepam (RESTORIL) 15 MG capsule Take 15 mg by mouth at bedtime.    . traMADol (ULTRAM) 50 MG tablet Take 1 tablet (50 mg total) by mouth every 12 (twelve) hours as needed for moderate pain. 20 tablet 0  . warfarin (COUMADIN) 3 MG tablet Take 3 tablets by mouth daily except 4 tablets on Sunday or as directed by coumadin clinic. 280 tablet 0   No current facility-administered medications for this visit.       Physical Exam:   BP 115/75   Pulse 90   Temp 97.6 F (36.4 C) (Skin)   Resp 20   Ht 5\' 6"  (1.676 m)   Wt 134 lb (60.8 kg)   LMP 12/05/2010 (LMP Unknown)   SpO2 96% Comment:  RA  BMI 21.63 kg/m   General:  Well-appearing  Chest:   Clear to auscultation  CV:   Regular rate and rhythm with mechanical heart valve sounds  Incisions:  Completely healed, sternum is stable  Abdomen:  Soft nontender  Extremities:  Warm and well-perfused  Diagnostic Tests:  ECHOCARDIOGRAM REPORT       Patient Name:   [name]   Date of Exam: 03/12/2019 Medical Rec #:  [id number]     Height:       66.0 in Accession #:    [id number]    Weight:       134.8 lb Date of Birth:  [birth year]    BSA:          1.69 m Patient Age:    41 years      BP:           110/70 mmHg Patient Gender: F             HR:           101 bpm. Exam Location:  [facility]   Procedure: 2D Echo, Cardiac Doppler and Color Doppler  Indications:    Z95.4 Status post Aortic valve replacement                 Z95.4 Status post Mitral valve replacement   History:        Patient has prior history of Echocardiogram examinations, most                 recent 07/03/2018. Status MVR 31 mm Sorin [employer] Optiform .                 Status post AVR 21 mm Sorin [employer] Risk                 Factors:Hypertension. Aortic Valve: A 15mm Sorin [employer]                 mechanical aortic valve prosthesis valve is present in the                 aortic position. Procedure Date: 12/02/2018 Mitral Valve: A 31                 Sorin [employer] Optiform mechanical valve is present in the                 mitral position. Procedure Date: 12/02/2018. Murmur. End stage                 renal disease. Lupus. Pneumionia.   Sonographer:    Cresenciano Lick RDCS Referring Phys: [id number] Blair Dolphin WEAVER  IMPRESSIONS    1. Left ventricular ejection fraction, by visual estimation, is 65 to 70%. The left ventricle has normal function. Normal left ventricular size. There is mildly increased left ventricular hypertrophy.  2. Global right ventricle has normal systolic function.The right ventricular size is normal. No increase in right ventricular wall thickness.  3. Left atrial size was mildly dilated.  4. Right atrial size was normal.  5. The mitral valve has been repaired/replaced. No evidence of mitral valve regurgitation. No evidence of mitral stenosis.  6. Sorin [employer] Optiform bileaflet mechanical valve (size 31 mm, cat # [id number], serial # [id number]).     Mean gradient 16mmHg.  7. The tricuspid valve is normal in structure. Tricuspid valve regurgitation is trivial.  8. Aortic valve regurgitation was not visualized by color flow Doppler. Structurally normal aortic valve, with no  evidence of sclerosis or stenosis.  9. Mechanical prosthesis in the aortic valve position. 10. Sorin  [employer] Top-Hat bileaflet mechanical valve (size 21 mm, cat # [id number], serial # [id number]).     Peak velocity: 2.55m/s, mean gradient 57mmHg. 11. The pulmonic valve was normal in structure. Pulmonic valve regurgitation is not visualized by color flow Doppler. 12. Normal pulmonary artery systolic pressure. 13. The inferior vena cava is normal in size with greater than 50% respiratory variability, suggesting right atrial pressure of 3 mmHg.  FINDINGS  Left Ventricle: Left ventricular ejection fraction, by visual estimation, is 65 to 70%. The left ventricle has normal function. There is mildly increased left ventricular hypertrophy. Normal left ventricular size.  Right Ventricle: The right ventricular size is normal. No increase in right ventricular wall thickness. Global RV systolic function is has normal systolic function. The tricuspid regurgitant velocity is 2.21 m/s, and with an assumed right atrial pressure  of 10 mmHg, the estimated right ventricular systolic pressure is normal at 29.5 mmHg.  Left Atrium: Left atrial size was mildly dilated.  Right Atrium: Right atrial size was normal in size  Pericardium: There is no evidence of pericardial effusion.  Mitral Valve: The mitral valve has been repaired/replaced. No evidence of mitral valve stenosis by observation. MV peak gradient, 6.9 mmHg. No evidence of mitral valve regurgitation. Sorin [employer] Optiform bileaflet mechanical valve (size 31 mm, cat  # [id number], serial # [id number]). Mean gradient 25mmHg.  Tricuspid Valve: The tricuspid valve is normal in structure. Tricuspid valve regurgitation is trivial by color flow Doppler.  Aortic Valve: The aortic valve has been repaired/replaced. Aortic valve regurgitation was not visualized by color flow Doppler. The aortic valve is structurally normal, with no evidence of sclerosis or stenosis. Aortic valve mean gradient measures 17.0  mmHg. Aortic valve peak gradient measures 26.9  mmHg. Sorin [employer] mechanical aortic valve prosthesis is visualized. 12mm Sorin [employer] mechanical aortic valve prosthesis valve is present in the aortic position. Procedure Date: 12/02/2018 Echo  findings shows no evidence of rocking or perivalvular leak of the aortic prosthesis. Sorin [employer] Top-Hat bileaflet mechanical valve (size 21 mm, cat # [id number], serial # [id number]). Peak velocity: 2.99m/s, mean gradient 76mmHg.  Pulmonic Valve: The pulmonic valve was normal in structure. Pulmonic valve regurgitation is not visualized by color flow Doppler.  Aorta: The aortic root, ascending aorta and aortic arch are all structurally normal, with no evidence of dilitation or obstruction.  Venous: The inferior vena cava is normal in size with greater than 50% respiratory variability, suggesting right atrial pressure of 3 mmHg.  IAS/Shunts: No atrial level shunt detected by color flow Doppler. No ventricular septal defect is seen or detected. There is no evidence of an atrial septal defect.     LEFT VENTRICLE PLAX 2D LVIDd:         3.60 cm LVIDs:         2.00 cm LV PW:         1.20 cm LV IVS:        1.10 cm LV SV:         42 ml LV SV Index:   24.71    RIGHT VENTRICLE RV Basal diam:  3.30 cm RV S prime:     7.83 cm/s TAPSE (M-mode): 1.0 cm  LEFT ATRIUM             Index       RIGHT ATRIUM           Index LA  diam:        4.50 cm 2.66 cm/m  RA Area:     13.30 cm LA Vol (A2C):   76.5 ml 45.24 ml/m RA Volume:   35.30 ml  20.87 ml/m LA Vol (A4C):   45.1 ml 26.67 ml/m LA Biplane Vol: 61.5 ml 36.37 ml/m  AORTIC VALVE AV Vmax:           259.20 cm/s AV Vmean:          199.800 cm/s AV VTI:            0.426 m AV Peak Grad:      26.9 mmHg AV Mean Grad:      17.0 mmHg LVOT Vmax:         124.00 cm/s LVOT Vmean:        93.200 cm/s LVOT VTI:          0.194 m LVOT/AV VTI ratio: 0.45   AORTA Ao Root diam: 2.80 cm Ao Asc diam:  3.40 cm  MITRAL VALVE                          TRICUSPID VALVE MV Area (PHT): 3.12 cm              TR Peak grad:   19.5 mmHg MV Peak grad:  6.9 mmHg              TR Vmax:        221.00 cm/s MV Mean grad:  4.5 mmHg MV Vmax:       1.31 m/s              SHUNTS MV Vmean:      104.2 cm/s            Systemic VTI: 0.19 m MV VTI:        0.26 m MV PHT:        70.47 msec MV Decel Time: 243 msec MV E velocity: 111.00 cm/s 103 cm/s MV A velocity: 142.00 cm/s 70.3 cm/s MV E/A ratio:  0.78        1.5    Candee Furbish MD Electronically signed by Candee Furbish MD Signature Date/Time: 03/12/2019/5:06:45 PM   Impression:  Patient is doing very well more than 3 months status post aortic and mitral valve replacement using mechanical prosthetic valve for rheumatic heart disease.  Plan:  We have not recommended any change the patient's current medications.  The patient has been reminded regarding the importance of dental hygiene and the lifelong need for antibiotic prophylaxis for all dental cleanings and other related invasive procedures.  The patient will continue to follow-up intermittently with Dr. Harrington Challenger and return to our office for routine follow-up next summer approximately 1 year following her surgery.  She will call and return sooner only should specific problems or questions arise.   I spent in excess of 10 minutes during the conduct of this office consultation and >50% of this time involved direct face-to-face encounter with the patient for counseling and/or coordination of their care.   Valentina Gu. Roxy Manns, MD 03/24/2019 4:46 PM

## 2019-03-24 NOTE — Patient Instructions (Signed)

## 2019-03-25 DIAGNOSIS — M3210 Systemic lupus erythematosus, organ or system involvement unspecified: Secondary | ICD-10-CM | POA: Diagnosis not present

## 2019-03-25 DIAGNOSIS — D509 Iron deficiency anemia, unspecified: Secondary | ICD-10-CM | POA: Diagnosis not present

## 2019-03-25 DIAGNOSIS — N186 End stage renal disease: Secondary | ICD-10-CM | POA: Diagnosis not present

## 2019-03-25 DIAGNOSIS — D631 Anemia in chronic kidney disease: Secondary | ICD-10-CM | POA: Diagnosis not present

## 2019-03-25 DIAGNOSIS — Z992 Dependence on renal dialysis: Secondary | ICD-10-CM | POA: Diagnosis not present

## 2019-03-25 DIAGNOSIS — N2581 Secondary hyperparathyroidism of renal origin: Secondary | ICD-10-CM | POA: Diagnosis not present

## 2019-03-30 DIAGNOSIS — N2581 Secondary hyperparathyroidism of renal origin: Secondary | ICD-10-CM | POA: Diagnosis not present

## 2019-03-30 DIAGNOSIS — Z992 Dependence on renal dialysis: Secondary | ICD-10-CM | POA: Diagnosis not present

## 2019-03-30 DIAGNOSIS — N186 End stage renal disease: Secondary | ICD-10-CM | POA: Diagnosis not present

## 2019-03-30 DIAGNOSIS — D631 Anemia in chronic kidney disease: Secondary | ICD-10-CM | POA: Diagnosis not present

## 2019-03-30 DIAGNOSIS — M3210 Systemic lupus erythematosus, organ or system involvement unspecified: Secondary | ICD-10-CM | POA: Diagnosis not present

## 2019-03-30 DIAGNOSIS — D509 Iron deficiency anemia, unspecified: Secondary | ICD-10-CM | POA: Diagnosis not present

## 2019-03-31 ENCOUNTER — Other Ambulatory Visit: Payer: Self-pay

## 2019-03-31 ENCOUNTER — Ambulatory Visit (INDEPENDENT_AMBULATORY_CARE_PROVIDER_SITE_OTHER): Payer: Medicare Other | Admitting: *Deleted

## 2019-03-31 DIAGNOSIS — Z5181 Encounter for therapeutic drug level monitoring: Secondary | ICD-10-CM | POA: Diagnosis not present

## 2019-03-31 DIAGNOSIS — Z954 Presence of other heart-valve replacement: Secondary | ICD-10-CM

## 2019-03-31 LAB — POCT INR: INR: 5.1 — AB (ref 2.0–3.0)

## 2019-03-31 NOTE — Patient Instructions (Signed)
Description   Do not take any Warfarin today and No Warfarin tomorrow then start taking 3 tablets daily. Recheck INR in 10 days. Call Coumadin clinic with any problems [phone].

## 2019-04-01 DIAGNOSIS — N186 End stage renal disease: Secondary | ICD-10-CM | POA: Diagnosis not present

## 2019-04-01 DIAGNOSIS — N2581 Secondary hyperparathyroidism of renal origin: Secondary | ICD-10-CM | POA: Diagnosis not present

## 2019-04-01 DIAGNOSIS — M3210 Systemic lupus erythematosus, organ or system involvement unspecified: Secondary | ICD-10-CM | POA: Diagnosis not present

## 2019-04-01 DIAGNOSIS — Z992 Dependence on renal dialysis: Secondary | ICD-10-CM | POA: Diagnosis not present

## 2019-04-01 DIAGNOSIS — D509 Iron deficiency anemia, unspecified: Secondary | ICD-10-CM | POA: Diagnosis not present

## 2019-04-01 DIAGNOSIS — D631 Anemia in chronic kidney disease: Secondary | ICD-10-CM | POA: Diagnosis not present

## 2019-04-03 DIAGNOSIS — M3210 Systemic lupus erythematosus, organ or system involvement unspecified: Secondary | ICD-10-CM | POA: Diagnosis not present

## 2019-04-03 DIAGNOSIS — Z992 Dependence on renal dialysis: Secondary | ICD-10-CM | POA: Diagnosis not present

## 2019-04-03 DIAGNOSIS — D631 Anemia in chronic kidney disease: Secondary | ICD-10-CM | POA: Diagnosis not present

## 2019-04-03 DIAGNOSIS — D509 Iron deficiency anemia, unspecified: Secondary | ICD-10-CM | POA: Diagnosis not present

## 2019-04-03 DIAGNOSIS — N186 End stage renal disease: Secondary | ICD-10-CM | POA: Diagnosis not present

## 2019-04-03 DIAGNOSIS — N2581 Secondary hyperparathyroidism of renal origin: Secondary | ICD-10-CM | POA: Diagnosis not present

## 2019-04-06 DIAGNOSIS — D509 Iron deficiency anemia, unspecified: Secondary | ICD-10-CM | POA: Diagnosis not present

## 2019-04-06 DIAGNOSIS — N2581 Secondary hyperparathyroidism of renal origin: Secondary | ICD-10-CM | POA: Diagnosis not present

## 2019-04-06 DIAGNOSIS — Z992 Dependence on renal dialysis: Secondary | ICD-10-CM | POA: Diagnosis not present

## 2019-04-06 DIAGNOSIS — M3210 Systemic lupus erythematosus, organ or system involvement unspecified: Secondary | ICD-10-CM | POA: Diagnosis not present

## 2019-04-06 DIAGNOSIS — N186 End stage renal disease: Secondary | ICD-10-CM | POA: Diagnosis not present

## 2019-04-06 DIAGNOSIS — D631 Anemia in chronic kidney disease: Secondary | ICD-10-CM | POA: Diagnosis not present

## 2019-04-08 DIAGNOSIS — D509 Iron deficiency anemia, unspecified: Secondary | ICD-10-CM | POA: Diagnosis not present

## 2019-04-08 DIAGNOSIS — M3210 Systemic lupus erythematosus, organ or system involvement unspecified: Secondary | ICD-10-CM | POA: Diagnosis not present

## 2019-04-08 DIAGNOSIS — N186 End stage renal disease: Secondary | ICD-10-CM | POA: Diagnosis not present

## 2019-04-08 DIAGNOSIS — D631 Anemia in chronic kidney disease: Secondary | ICD-10-CM | POA: Diagnosis not present

## 2019-04-08 DIAGNOSIS — Z992 Dependence on renal dialysis: Secondary | ICD-10-CM | POA: Diagnosis not present

## 2019-04-08 DIAGNOSIS — N2581 Secondary hyperparathyroidism of renal origin: Secondary | ICD-10-CM | POA: Diagnosis not present

## 2019-04-09 ENCOUNTER — Ambulatory Visit (INDEPENDENT_AMBULATORY_CARE_PROVIDER_SITE_OTHER): Payer: Medicare Other | Admitting: *Deleted

## 2019-04-09 ENCOUNTER — Other Ambulatory Visit: Payer: Self-pay

## 2019-04-09 DIAGNOSIS — Z5181 Encounter for therapeutic drug level monitoring: Secondary | ICD-10-CM | POA: Diagnosis not present

## 2019-04-09 DIAGNOSIS — Z954 Presence of other heart-valve replacement: Secondary | ICD-10-CM | POA: Diagnosis not present

## 2019-04-09 LAB — POCT INR: INR: 2.3 (ref 2.0–3.0)

## 2019-04-09 NOTE — Patient Instructions (Signed)
Description   Today take 3.5 tablets then continue taking 3 tablets daily. Recheck INR in 2 weeks. Call Coumadin clinic with any problems [phone].

## 2019-04-10 DIAGNOSIS — M3210 Systemic lupus erythematosus, organ or system involvement unspecified: Secondary | ICD-10-CM | POA: Diagnosis not present

## 2019-04-10 DIAGNOSIS — N186 End stage renal disease: Secondary | ICD-10-CM | POA: Diagnosis not present

## 2019-04-10 DIAGNOSIS — N2581 Secondary hyperparathyroidism of renal origin: Secondary | ICD-10-CM | POA: Diagnosis not present

## 2019-04-10 DIAGNOSIS — D631 Anemia in chronic kidney disease: Secondary | ICD-10-CM | POA: Diagnosis not present

## 2019-04-10 DIAGNOSIS — D509 Iron deficiency anemia, unspecified: Secondary | ICD-10-CM | POA: Diagnosis not present

## 2019-04-10 DIAGNOSIS — Z992 Dependence on renal dialysis: Secondary | ICD-10-CM | POA: Diagnosis not present

## 2019-04-12 DIAGNOSIS — M3210 Systemic lupus erythematosus, organ or system involvement unspecified: Secondary | ICD-10-CM | POA: Diagnosis not present

## 2019-04-12 DIAGNOSIS — N186 End stage renal disease: Secondary | ICD-10-CM | POA: Diagnosis not present

## 2019-04-12 DIAGNOSIS — N2581 Secondary hyperparathyroidism of renal origin: Secondary | ICD-10-CM | POA: Diagnosis not present

## 2019-04-12 DIAGNOSIS — D509 Iron deficiency anemia, unspecified: Secondary | ICD-10-CM | POA: Diagnosis not present

## 2019-04-12 DIAGNOSIS — Z992 Dependence on renal dialysis: Secondary | ICD-10-CM | POA: Diagnosis not present

## 2019-04-12 DIAGNOSIS — D631 Anemia in chronic kidney disease: Secondary | ICD-10-CM | POA: Diagnosis not present

## 2019-04-14 DIAGNOSIS — N2581 Secondary hyperparathyroidism of renal origin: Secondary | ICD-10-CM | POA: Diagnosis not present

## 2019-04-14 DIAGNOSIS — N186 End stage renal disease: Secondary | ICD-10-CM | POA: Diagnosis not present

## 2019-04-14 DIAGNOSIS — D631 Anemia in chronic kidney disease: Secondary | ICD-10-CM | POA: Diagnosis not present

## 2019-04-14 DIAGNOSIS — D509 Iron deficiency anemia, unspecified: Secondary | ICD-10-CM | POA: Diagnosis not present

## 2019-04-14 DIAGNOSIS — Z992 Dependence on renal dialysis: Secondary | ICD-10-CM | POA: Diagnosis not present

## 2019-04-14 DIAGNOSIS — M3210 Systemic lupus erythematosus, organ or system involvement unspecified: Secondary | ICD-10-CM | POA: Diagnosis not present

## 2019-04-17 DIAGNOSIS — M3210 Systemic lupus erythematosus, organ or system involvement unspecified: Secondary | ICD-10-CM | POA: Diagnosis not present

## 2019-04-17 DIAGNOSIS — Z992 Dependence on renal dialysis: Secondary | ICD-10-CM | POA: Diagnosis not present

## 2019-04-17 DIAGNOSIS — N186 End stage renal disease: Secondary | ICD-10-CM | POA: Diagnosis not present

## 2019-04-17 DIAGNOSIS — N2581 Secondary hyperparathyroidism of renal origin: Secondary | ICD-10-CM | POA: Diagnosis not present

## 2019-04-17 DIAGNOSIS — D631 Anemia in chronic kidney disease: Secondary | ICD-10-CM | POA: Diagnosis not present

## 2019-04-17 DIAGNOSIS — D509 Iron deficiency anemia, unspecified: Secondary | ICD-10-CM | POA: Diagnosis not present

## 2019-04-20 DIAGNOSIS — D631 Anemia in chronic kidney disease: Secondary | ICD-10-CM | POA: Diagnosis not present

## 2019-04-20 DIAGNOSIS — N186 End stage renal disease: Secondary | ICD-10-CM | POA: Diagnosis not present

## 2019-04-20 DIAGNOSIS — Z992 Dependence on renal dialysis: Secondary | ICD-10-CM | POA: Diagnosis not present

## 2019-04-20 DIAGNOSIS — M3210 Systemic lupus erythematosus, organ or system involvement unspecified: Secondary | ICD-10-CM | POA: Diagnosis not present

## 2019-04-20 DIAGNOSIS — N2581 Secondary hyperparathyroidism of renal origin: Secondary | ICD-10-CM | POA: Diagnosis not present

## 2019-04-20 DIAGNOSIS — D509 Iron deficiency anemia, unspecified: Secondary | ICD-10-CM | POA: Diagnosis not present

## 2019-04-21 ENCOUNTER — Other Ambulatory Visit: Payer: Self-pay

## 2019-04-21 ENCOUNTER — Ambulatory Visit (INDEPENDENT_AMBULATORY_CARE_PROVIDER_SITE_OTHER): Payer: Medicare Other | Admitting: *Deleted

## 2019-04-21 DIAGNOSIS — Z5181 Encounter for therapeutic drug level monitoring: Secondary | ICD-10-CM

## 2019-04-21 DIAGNOSIS — Z954 Presence of other heart-valve replacement: Secondary | ICD-10-CM | POA: Diagnosis not present

## 2019-04-21 LAB — POCT INR: INR: 3 (ref 2.0–3.0)

## 2019-04-21 NOTE — Patient Instructions (Signed)
Description   Continue taking 3 tablets daily. Recheck INR in 3 weeks. Call Coumadin clinic with any problems [phone].

## 2019-05-12 ENCOUNTER — Other Ambulatory Visit: Payer: Self-pay

## 2019-05-12 ENCOUNTER — Ambulatory Visit (INDEPENDENT_AMBULATORY_CARE_PROVIDER_SITE_OTHER): Payer: Medicare Other | Admitting: *Deleted

## 2019-05-12 DIAGNOSIS — Z954 Presence of other heart-valve replacement: Secondary | ICD-10-CM | POA: Diagnosis not present

## 2019-05-12 DIAGNOSIS — Z5181 Encounter for therapeutic drug level monitoring: Secondary | ICD-10-CM

## 2019-05-12 LAB — POCT INR: INR: 3 (ref 2.0–3.0)

## 2019-05-12 MED ORDER — WARFARIN SODIUM 3 MG PO TABS: ORAL_TABLET | ORAL | 0 refills | Status: DC

## 2019-05-12 NOTE — Patient Instructions (Signed)
Description   Continue taking 3 tablets daily. Recheck INR in 4 weeks. Call Coumadin clinic with any problems [phone].

## 2019-05-23 DIAGNOSIS — M3210 Systemic lupus erythematosus, organ or system involvement unspecified: Secondary | ICD-10-CM | POA: Diagnosis not present

## 2019-05-23 DIAGNOSIS — D688 Other specified coagulation defects: Secondary | ICD-10-CM | POA: Diagnosis not present

## 2019-05-23 DIAGNOSIS — Z992 Dependence on renal dialysis: Secondary | ICD-10-CM | POA: Diagnosis not present

## 2019-05-23 DIAGNOSIS — D631 Anemia in chronic kidney disease: Secondary | ICD-10-CM | POA: Diagnosis not present

## 2019-05-23 DIAGNOSIS — R52 Pain, unspecified: Secondary | ICD-10-CM | POA: Diagnosis not present

## 2019-05-23 DIAGNOSIS — N186 End stage renal disease: Secondary | ICD-10-CM | POA: Diagnosis not present

## 2019-05-23 DIAGNOSIS — N2581 Secondary hyperparathyroidism of renal origin: Secondary | ICD-10-CM | POA: Diagnosis not present

## 2019-05-23 DIAGNOSIS — R519 Headache, unspecified: Secondary | ICD-10-CM | POA: Diagnosis not present

## 2019-05-25 DIAGNOSIS — R52 Pain, unspecified: Secondary | ICD-10-CM | POA: Diagnosis not present

## 2019-05-25 DIAGNOSIS — Z992 Dependence on renal dialysis: Secondary | ICD-10-CM | POA: Diagnosis not present

## 2019-05-25 DIAGNOSIS — D631 Anemia in chronic kidney disease: Secondary | ICD-10-CM | POA: Diagnosis not present

## 2019-05-25 DIAGNOSIS — N186 End stage renal disease: Secondary | ICD-10-CM | POA: Diagnosis not present

## 2019-05-25 DIAGNOSIS — R519 Headache, unspecified: Secondary | ICD-10-CM | POA: Diagnosis not present

## 2019-05-25 DIAGNOSIS — M3210 Systemic lupus erythematosus, organ or system involvement unspecified: Secondary | ICD-10-CM | POA: Diagnosis not present

## 2019-05-25 DIAGNOSIS — N2581 Secondary hyperparathyroidism of renal origin: Secondary | ICD-10-CM | POA: Diagnosis not present

## 2019-05-25 DIAGNOSIS — D688 Other specified coagulation defects: Secondary | ICD-10-CM | POA: Diagnosis not present

## 2019-05-27 DIAGNOSIS — M3210 Systemic lupus erythematosus, organ or system involvement unspecified: Secondary | ICD-10-CM | POA: Diagnosis not present

## 2019-05-27 DIAGNOSIS — N186 End stage renal disease: Secondary | ICD-10-CM | POA: Diagnosis not present

## 2019-05-27 DIAGNOSIS — D631 Anemia in chronic kidney disease: Secondary | ICD-10-CM | POA: Diagnosis not present

## 2019-05-27 DIAGNOSIS — R519 Headache, unspecified: Secondary | ICD-10-CM | POA: Diagnosis not present

## 2019-05-27 DIAGNOSIS — N2581 Secondary hyperparathyroidism of renal origin: Secondary | ICD-10-CM | POA: Diagnosis not present

## 2019-05-27 DIAGNOSIS — R52 Pain, unspecified: Secondary | ICD-10-CM | POA: Diagnosis not present

## 2019-05-27 DIAGNOSIS — D688 Other specified coagulation defects: Secondary | ICD-10-CM | POA: Diagnosis not present

## 2019-05-27 DIAGNOSIS — Z992 Dependence on renal dialysis: Secondary | ICD-10-CM | POA: Diagnosis not present

## 2019-06-01 DIAGNOSIS — D631 Anemia in chronic kidney disease: Secondary | ICD-10-CM | POA: Diagnosis not present

## 2019-06-01 DIAGNOSIS — N186 End stage renal disease: Secondary | ICD-10-CM | POA: Diagnosis not present

## 2019-06-01 DIAGNOSIS — N2581 Secondary hyperparathyroidism of renal origin: Secondary | ICD-10-CM | POA: Diagnosis not present

## 2019-06-01 DIAGNOSIS — R519 Headache, unspecified: Secondary | ICD-10-CM | POA: Diagnosis not present

## 2019-06-01 DIAGNOSIS — Z992 Dependence on renal dialysis: Secondary | ICD-10-CM | POA: Diagnosis not present

## 2019-06-01 DIAGNOSIS — R52 Pain, unspecified: Secondary | ICD-10-CM | POA: Diagnosis not present

## 2019-06-01 DIAGNOSIS — D688 Other specified coagulation defects: Secondary | ICD-10-CM | POA: Diagnosis not present

## 2019-06-01 DIAGNOSIS — M3210 Systemic lupus erythematosus, organ or system involvement unspecified: Secondary | ICD-10-CM | POA: Diagnosis not present

## 2019-06-03 DIAGNOSIS — N186 End stage renal disease: Secondary | ICD-10-CM | POA: Diagnosis not present

## 2019-06-03 DIAGNOSIS — D631 Anemia in chronic kidney disease: Secondary | ICD-10-CM | POA: Diagnosis not present

## 2019-06-03 DIAGNOSIS — M3210 Systemic lupus erythematosus, organ or system involvement unspecified: Secondary | ICD-10-CM | POA: Diagnosis not present

## 2019-06-03 DIAGNOSIS — R519 Headache, unspecified: Secondary | ICD-10-CM | POA: Diagnosis not present

## 2019-06-03 DIAGNOSIS — N2581 Secondary hyperparathyroidism of renal origin: Secondary | ICD-10-CM | POA: Diagnosis not present

## 2019-06-03 DIAGNOSIS — D688 Other specified coagulation defects: Secondary | ICD-10-CM | POA: Diagnosis not present

## 2019-06-03 DIAGNOSIS — R52 Pain, unspecified: Secondary | ICD-10-CM | POA: Diagnosis not present

## 2019-06-03 DIAGNOSIS — Z992 Dependence on renal dialysis: Secondary | ICD-10-CM | POA: Diagnosis not present

## 2019-06-05 DIAGNOSIS — R52 Pain, unspecified: Secondary | ICD-10-CM | POA: Diagnosis not present

## 2019-06-05 DIAGNOSIS — R519 Headache, unspecified: Secondary | ICD-10-CM | POA: Diagnosis not present

## 2019-06-05 DIAGNOSIS — M3210 Systemic lupus erythematosus, organ or system involvement unspecified: Secondary | ICD-10-CM | POA: Diagnosis not present

## 2019-06-05 DIAGNOSIS — D631 Anemia in chronic kidney disease: Secondary | ICD-10-CM | POA: Diagnosis not present

## 2019-06-05 DIAGNOSIS — D688 Other specified coagulation defects: Secondary | ICD-10-CM | POA: Diagnosis not present

## 2019-06-05 DIAGNOSIS — N2581 Secondary hyperparathyroidism of renal origin: Secondary | ICD-10-CM | POA: Diagnosis not present

## 2019-06-05 DIAGNOSIS — N186 End stage renal disease: Secondary | ICD-10-CM | POA: Diagnosis not present

## 2019-06-05 DIAGNOSIS — Z992 Dependence on renal dialysis: Secondary | ICD-10-CM | POA: Diagnosis not present

## 2019-06-08 DIAGNOSIS — M3210 Systemic lupus erythematosus, organ or system involvement unspecified: Secondary | ICD-10-CM | POA: Diagnosis not present

## 2019-06-08 DIAGNOSIS — D631 Anemia in chronic kidney disease: Secondary | ICD-10-CM | POA: Diagnosis not present

## 2019-06-08 DIAGNOSIS — N2581 Secondary hyperparathyroidism of renal origin: Secondary | ICD-10-CM | POA: Diagnosis not present

## 2019-06-08 DIAGNOSIS — N186 End stage renal disease: Secondary | ICD-10-CM | POA: Diagnosis not present

## 2019-06-08 DIAGNOSIS — Z992 Dependence on renal dialysis: Secondary | ICD-10-CM | POA: Diagnosis not present

## 2019-06-08 DIAGNOSIS — R519 Headache, unspecified: Secondary | ICD-10-CM | POA: Diagnosis not present

## 2019-06-08 DIAGNOSIS — J9811 Atelectasis: Secondary | ICD-10-CM | POA: Diagnosis not present

## 2019-06-08 DIAGNOSIS — R52 Pain, unspecified: Secondary | ICD-10-CM | POA: Diagnosis not present

## 2019-06-08 DIAGNOSIS — I050 Rheumatic mitral stenosis: Secondary | ICD-10-CM | POA: Diagnosis not present

## 2019-06-08 DIAGNOSIS — D688 Other specified coagulation defects: Secondary | ICD-10-CM | POA: Diagnosis not present

## 2019-06-08 DIAGNOSIS — I350 Nonrheumatic aortic (valve) stenosis: Secondary | ICD-10-CM | POA: Diagnosis not present

## 2019-06-09 ENCOUNTER — Ambulatory Visit (INDEPENDENT_AMBULATORY_CARE_PROVIDER_SITE_OTHER): Payer: Medicare HMO | Admitting: *Deleted

## 2019-06-09 ENCOUNTER — Other Ambulatory Visit: Payer: Self-pay

## 2019-06-09 DIAGNOSIS — Z5181 Encounter for therapeutic drug level monitoring: Secondary | ICD-10-CM

## 2019-06-09 DIAGNOSIS — Z954 Presence of other heart-valve replacement: Secondary | ICD-10-CM | POA: Diagnosis not present

## 2019-06-09 LAB — POCT INR: INR: 2.6 (ref 2.0–3.0)

## 2019-06-09 NOTE — Patient Instructions (Signed)
Description   Continue taking 3 tablets daily. Recheck INR in 5 weeks. Call Coumadin clinic with any problems [phone].

## 2019-06-10 DIAGNOSIS — D631 Anemia in chronic kidney disease: Secondary | ICD-10-CM | POA: Diagnosis not present

## 2019-06-10 DIAGNOSIS — R52 Pain, unspecified: Secondary | ICD-10-CM | POA: Diagnosis not present

## 2019-06-10 DIAGNOSIS — N186 End stage renal disease: Secondary | ICD-10-CM | POA: Diagnosis not present

## 2019-06-10 DIAGNOSIS — R519 Headache, unspecified: Secondary | ICD-10-CM | POA: Diagnosis not present

## 2019-06-10 DIAGNOSIS — M3210 Systemic lupus erythematosus, organ or system involvement unspecified: Secondary | ICD-10-CM | POA: Diagnosis not present

## 2019-06-10 DIAGNOSIS — D688 Other specified coagulation defects: Secondary | ICD-10-CM | POA: Diagnosis not present

## 2019-06-10 DIAGNOSIS — Z992 Dependence on renal dialysis: Secondary | ICD-10-CM | POA: Diagnosis not present

## 2019-06-10 DIAGNOSIS — N2581 Secondary hyperparathyroidism of renal origin: Secondary | ICD-10-CM | POA: Diagnosis not present

## 2019-06-12 DIAGNOSIS — D631 Anemia in chronic kidney disease: Secondary | ICD-10-CM | POA: Diagnosis not present

## 2019-06-12 DIAGNOSIS — D688 Other specified coagulation defects: Secondary | ICD-10-CM | POA: Diagnosis not present

## 2019-06-12 DIAGNOSIS — R519 Headache, unspecified: Secondary | ICD-10-CM | POA: Diagnosis not present

## 2019-06-12 DIAGNOSIS — R52 Pain, unspecified: Secondary | ICD-10-CM | POA: Diagnosis not present

## 2019-06-12 DIAGNOSIS — Z992 Dependence on renal dialysis: Secondary | ICD-10-CM | POA: Diagnosis not present

## 2019-06-12 DIAGNOSIS — M3210 Systemic lupus erythematosus, organ or system involvement unspecified: Secondary | ICD-10-CM | POA: Diagnosis not present

## 2019-06-12 DIAGNOSIS — N2581 Secondary hyperparathyroidism of renal origin: Secondary | ICD-10-CM | POA: Diagnosis not present

## 2019-06-12 DIAGNOSIS — N186 End stage renal disease: Secondary | ICD-10-CM | POA: Diagnosis not present

## 2019-06-15 DIAGNOSIS — M3210 Systemic lupus erythematosus, organ or system involvement unspecified: Secondary | ICD-10-CM | POA: Diagnosis not present

## 2019-06-15 DIAGNOSIS — D688 Other specified coagulation defects: Secondary | ICD-10-CM | POA: Diagnosis not present

## 2019-06-15 DIAGNOSIS — D631 Anemia in chronic kidney disease: Secondary | ICD-10-CM | POA: Diagnosis not present

## 2019-06-15 DIAGNOSIS — R519 Headache, unspecified: Secondary | ICD-10-CM | POA: Diagnosis not present

## 2019-06-15 DIAGNOSIS — Z992 Dependence on renal dialysis: Secondary | ICD-10-CM | POA: Diagnosis not present

## 2019-06-15 DIAGNOSIS — R52 Pain, unspecified: Secondary | ICD-10-CM | POA: Diagnosis not present

## 2019-06-15 DIAGNOSIS — N2581 Secondary hyperparathyroidism of renal origin: Secondary | ICD-10-CM | POA: Diagnosis not present

## 2019-06-15 DIAGNOSIS — N186 End stage renal disease: Secondary | ICD-10-CM | POA: Diagnosis not present

## 2019-06-17 DIAGNOSIS — M3210 Systemic lupus erythematosus, organ or system involvement unspecified: Secondary | ICD-10-CM | POA: Diagnosis not present

## 2019-06-17 DIAGNOSIS — N186 End stage renal disease: Secondary | ICD-10-CM | POA: Diagnosis not present

## 2019-06-17 DIAGNOSIS — D631 Anemia in chronic kidney disease: Secondary | ICD-10-CM | POA: Diagnosis not present

## 2019-06-17 DIAGNOSIS — R52 Pain, unspecified: Secondary | ICD-10-CM | POA: Diagnosis not present

## 2019-06-17 DIAGNOSIS — Z992 Dependence on renal dialysis: Secondary | ICD-10-CM | POA: Diagnosis not present

## 2019-06-17 DIAGNOSIS — R519 Headache, unspecified: Secondary | ICD-10-CM | POA: Diagnosis not present

## 2019-06-17 DIAGNOSIS — D688 Other specified coagulation defects: Secondary | ICD-10-CM | POA: Diagnosis not present

## 2019-06-17 DIAGNOSIS — N2581 Secondary hyperparathyroidism of renal origin: Secondary | ICD-10-CM | POA: Diagnosis not present

## 2019-06-19 DIAGNOSIS — N186 End stage renal disease: Secondary | ICD-10-CM | POA: Diagnosis not present

## 2019-06-19 DIAGNOSIS — N2581 Secondary hyperparathyroidism of renal origin: Secondary | ICD-10-CM | POA: Diagnosis not present

## 2019-06-19 DIAGNOSIS — R519 Headache, unspecified: Secondary | ICD-10-CM | POA: Diagnosis not present

## 2019-06-19 DIAGNOSIS — D688 Other specified coagulation defects: Secondary | ICD-10-CM | POA: Diagnosis not present

## 2019-06-19 DIAGNOSIS — R52 Pain, unspecified: Secondary | ICD-10-CM | POA: Diagnosis not present

## 2019-06-19 DIAGNOSIS — M3210 Systemic lupus erythematosus, organ or system involvement unspecified: Secondary | ICD-10-CM | POA: Diagnosis not present

## 2019-06-19 DIAGNOSIS — D631 Anemia in chronic kidney disease: Secondary | ICD-10-CM | POA: Diagnosis not present

## 2019-06-19 DIAGNOSIS — Z992 Dependence on renal dialysis: Secondary | ICD-10-CM | POA: Diagnosis not present

## 2019-06-21 DIAGNOSIS — I120 Hypertensive chronic kidney disease with stage 5 chronic kidney disease or end stage renal disease: Secondary | ICD-10-CM | POA: Diagnosis not present

## 2019-06-21 DIAGNOSIS — Z992 Dependence on renal dialysis: Secondary | ICD-10-CM | POA: Diagnosis not present

## 2019-06-21 DIAGNOSIS — N186 End stage renal disease: Secondary | ICD-10-CM | POA: Diagnosis not present

## 2019-06-22 DIAGNOSIS — R519 Headache, unspecified: Secondary | ICD-10-CM | POA: Diagnosis not present

## 2019-06-22 DIAGNOSIS — N2581 Secondary hyperparathyroidism of renal origin: Secondary | ICD-10-CM | POA: Diagnosis not present

## 2019-06-22 DIAGNOSIS — Z992 Dependence on renal dialysis: Secondary | ICD-10-CM | POA: Diagnosis not present

## 2019-06-22 DIAGNOSIS — M3210 Systemic lupus erythematosus, organ or system involvement unspecified: Secondary | ICD-10-CM | POA: Diagnosis not present

## 2019-06-22 DIAGNOSIS — D631 Anemia in chronic kidney disease: Secondary | ICD-10-CM | POA: Diagnosis not present

## 2019-06-22 DIAGNOSIS — D688 Other specified coagulation defects: Secondary | ICD-10-CM | POA: Diagnosis not present

## 2019-06-22 DIAGNOSIS — N186 End stage renal disease: Secondary | ICD-10-CM | POA: Diagnosis not present

## 2019-06-24 DIAGNOSIS — Z992 Dependence on renal dialysis: Secondary | ICD-10-CM | POA: Diagnosis not present

## 2019-06-24 DIAGNOSIS — N186 End stage renal disease: Secondary | ICD-10-CM | POA: Diagnosis not present

## 2019-06-24 DIAGNOSIS — N2581 Secondary hyperparathyroidism of renal origin: Secondary | ICD-10-CM | POA: Diagnosis not present

## 2019-06-24 DIAGNOSIS — D688 Other specified coagulation defects: Secondary | ICD-10-CM | POA: Diagnosis not present

## 2019-06-24 DIAGNOSIS — M3210 Systemic lupus erythematosus, organ or system involvement unspecified: Secondary | ICD-10-CM | POA: Diagnosis not present

## 2019-06-24 DIAGNOSIS — R519 Headache, unspecified: Secondary | ICD-10-CM | POA: Diagnosis not present

## 2019-06-24 DIAGNOSIS — D631 Anemia in chronic kidney disease: Secondary | ICD-10-CM | POA: Diagnosis not present

## 2019-06-26 DIAGNOSIS — D631 Anemia in chronic kidney disease: Secondary | ICD-10-CM | POA: Diagnosis not present

## 2019-06-26 DIAGNOSIS — Z992 Dependence on renal dialysis: Secondary | ICD-10-CM | POA: Diagnosis not present

## 2019-06-26 DIAGNOSIS — N186 End stage renal disease: Secondary | ICD-10-CM | POA: Diagnosis not present

## 2019-06-26 DIAGNOSIS — M3210 Systemic lupus erythematosus, organ or system involvement unspecified: Secondary | ICD-10-CM | POA: Diagnosis not present

## 2019-06-26 DIAGNOSIS — R519 Headache, unspecified: Secondary | ICD-10-CM | POA: Diagnosis not present

## 2019-06-26 DIAGNOSIS — D688 Other specified coagulation defects: Secondary | ICD-10-CM | POA: Diagnosis not present

## 2019-06-26 DIAGNOSIS — N2581 Secondary hyperparathyroidism of renal origin: Secondary | ICD-10-CM | POA: Diagnosis not present

## 2019-06-29 DIAGNOSIS — M3210 Systemic lupus erythematosus, organ or system involvement unspecified: Secondary | ICD-10-CM | POA: Diagnosis not present

## 2019-06-29 DIAGNOSIS — D631 Anemia in chronic kidney disease: Secondary | ICD-10-CM | POA: Diagnosis not present

## 2019-06-29 DIAGNOSIS — Z992 Dependence on renal dialysis: Secondary | ICD-10-CM | POA: Diagnosis not present

## 2019-06-29 DIAGNOSIS — N186 End stage renal disease: Secondary | ICD-10-CM | POA: Diagnosis not present

## 2019-06-29 DIAGNOSIS — R519 Headache, unspecified: Secondary | ICD-10-CM | POA: Diagnosis not present

## 2019-06-29 DIAGNOSIS — N2581 Secondary hyperparathyroidism of renal origin: Secondary | ICD-10-CM | POA: Diagnosis not present

## 2019-06-29 DIAGNOSIS — D688 Other specified coagulation defects: Secondary | ICD-10-CM | POA: Diagnosis not present

## 2019-07-01 DIAGNOSIS — D631 Anemia in chronic kidney disease: Secondary | ICD-10-CM | POA: Diagnosis not present

## 2019-07-01 DIAGNOSIS — N186 End stage renal disease: Secondary | ICD-10-CM | POA: Diagnosis not present

## 2019-07-01 DIAGNOSIS — M3210 Systemic lupus erythematosus, organ or system involvement unspecified: Secondary | ICD-10-CM | POA: Diagnosis not present

## 2019-07-01 DIAGNOSIS — D688 Other specified coagulation defects: Secondary | ICD-10-CM | POA: Diagnosis not present

## 2019-07-01 DIAGNOSIS — Z992 Dependence on renal dialysis: Secondary | ICD-10-CM | POA: Diagnosis not present

## 2019-07-01 DIAGNOSIS — N2581 Secondary hyperparathyroidism of renal origin: Secondary | ICD-10-CM | POA: Diagnosis not present

## 2019-07-01 DIAGNOSIS — R519 Headache, unspecified: Secondary | ICD-10-CM | POA: Diagnosis not present

## 2019-07-02 ENCOUNTER — Encounter: Payer: Self-pay | Admitting: Obstetrics

## 2019-07-02 ENCOUNTER — Ambulatory Visit (INDEPENDENT_AMBULATORY_CARE_PROVIDER_SITE_OTHER): Payer: Medicare HMO | Admitting: Obstetrics

## 2019-07-02 ENCOUNTER — Other Ambulatory Visit (HOSPITAL_COMMUNITY)
Admission: RE | Admit: 2019-07-02 | Discharge: 2019-07-02 | Disposition: A | Discharge status: Home / Self Care | Payer: Medicare HMO | Source: Ambulatory Visit | Attending: Obstetrics | Admitting: Obstetrics

## 2019-07-02 ENCOUNTER — Other Ambulatory Visit: Payer: Self-pay

## 2019-07-02 VITALS — BP 136/83 | HR 97 | Ht 66.0 in | Wt 137.2 lb

## 2019-07-02 DIAGNOSIS — N186 End stage renal disease: Secondary | ICD-10-CM | POA: Diagnosis not present

## 2019-07-02 DIAGNOSIS — Z01411 Encounter for gynecological examination (general) (routine) with abnormal findings: Secondary | ICD-10-CM | POA: Diagnosis not present

## 2019-07-02 DIAGNOSIS — Z1151 Encounter for screening for human papillomavirus (HPV): Secondary | ICD-10-CM | POA: Diagnosis not present

## 2019-07-02 DIAGNOSIS — Z8249 Family history of ischemic heart disease and other diseases of the circulatory system: Secondary | ICD-10-CM | POA: Diagnosis not present

## 2019-07-02 DIAGNOSIS — Z803 Family history of malignant neoplasm of breast: Secondary | ICD-10-CM | POA: Diagnosis not present

## 2019-07-02 DIAGNOSIS — N760 Acute vaginitis: Secondary | ICD-10-CM | POA: Insufficient documentation

## 2019-07-02 DIAGNOSIS — Z809 Family history of malignant neoplasm, unspecified: Secondary | ICD-10-CM | POA: Diagnosis not present

## 2019-07-02 DIAGNOSIS — Z1239 Encounter for other screening for malignant neoplasm of breast: Secondary | ICD-10-CM | POA: Diagnosis not present

## 2019-07-02 DIAGNOSIS — R69 Illness, unspecified: Secondary | ICD-10-CM | POA: Diagnosis not present

## 2019-07-02 DIAGNOSIS — R87810 Cervical high risk human papillomavirus (HPV) DNA test positive: Secondary | ICD-10-CM | POA: Diagnosis not present

## 2019-07-02 DIAGNOSIS — Z833 Family history of diabetes mellitus: Secondary | ICD-10-CM | POA: Diagnosis not present

## 2019-07-02 DIAGNOSIS — Z7901 Long term (current) use of anticoagulants: Secondary | ICD-10-CM | POA: Diagnosis not present

## 2019-07-02 DIAGNOSIS — K219 Gastro-esophageal reflux disease without esophagitis: Secondary | ICD-10-CM | POA: Diagnosis not present

## 2019-07-02 DIAGNOSIS — I120 Hypertensive chronic kidney disease with stage 5 chronic kidney disease or end stage renal disease: Secondary | ICD-10-CM | POA: Diagnosis not present

## 2019-07-02 DIAGNOSIS — Z8673 Personal history of transient ischemic attack (TIA), and cerebral infarction without residual deficits: Secondary | ICD-10-CM | POA: Diagnosis not present

## 2019-07-02 DIAGNOSIS — Z01419 Encounter for gynecological examination (general) (routine) without abnormal findings: Secondary | ICD-10-CM | POA: Diagnosis not present

## 2019-07-02 DIAGNOSIS — B9689 Other specified bacterial agents as the cause of diseases classified elsewhere: Secondary | ICD-10-CM | POA: Diagnosis not present

## 2019-07-02 DIAGNOSIS — N898 Other specified noninflammatory disorders of vagina: Secondary | ICD-10-CM

## 2019-07-02 DIAGNOSIS — Z7982 Long term (current) use of aspirin: Secondary | ICD-10-CM | POA: Diagnosis not present

## 2019-07-02 NOTE — Progress Notes (Signed)
Subjective:        [name] is a 59 y.o. female here for a routine exam.  Current complaints: Vaginal itching for past 2 months.    Personal health questionnaire:  Is patient [name], have a family history of breast and/or ovarian cancer: no Is there a family history of uterine cancer diagnosed at age < 79, gastrointestinal cancer, urinary tract cancer, family member who is a Field seismologist syndrome-associated carrier: no Is the patient overweight and hypertensive, family history of diabetes, personal history of gestational diabetes, preeclampsia or PCOS: no Is patient over 1, have PCOS,  family history of premature CHD under age 57, diabetes, smoke, have hypertension or peripheral artery disease:  no At any time, has a partner hit, kicked or otherwise hurt or frightened you?: no Over the past 2 weeks, have you felt down, depressed or hopeless?: no Over the past 2 weeks, have you felt little interest or pleasure in doing things?:no   Gynecologic History Patient's last menstrual period was 12/05/2010 (lmp unknown). Contraception: post menopausal status Last Pap: 04-24-2016. Results were: NILM with + HPV Last mammogram: 03-15-2016. Results were: normal  Obstetric History OB History  Gravida Para Term Preterm AB Living  2         2  SAB TAB Ectopic Multiple Live Births               # Outcome Date GA Lbr Len/2nd Weight Sex Delivery Anes PTL Lv  2 Gravida      Vag-Spont     1 Gravida      Vag-Spont       Past Medical History:  Diagnosis Date  . Anemia   . Arthritis    "joints" (10/23/2017)  . Blood transfusion '08   [facility]; "low HgB" (10/23/2017)  . Chronic diastolic CHF (congestive heart failure) ([place])   . Claustrophobia   . Dysfunctional uterine bleeding 12/19/2010  . ESRD (end stage renal disease) on dialysis [facility])    "MWF; [name]." (10/23/2017)  . GERD (gastroesophageal reflux disease)   . Headache   . Hemodialysis patient [facility])    right extremity port  . History of  hiatal hernia   . Hx of cardiovascular stress test    Lexiscan Myoview 4/16:  Normal stress nuclear study, EF 59%  . Hypertension   . Lupus ([place])    "? kind" (10/23/2017)  . Mitral stenosis    Echo 4/16:  EF 55-60%, no RWMA, Gr 1 DD, mod MS (mean 9 mmHg), mod LAE, mild RAE, PASP 65, mod to severe TR, trivial eff // Echo 6/19:  Mild LVH, EF 55-60, no RWMA, Gr 2 DD, mild to mod AS (Mean 20), severe MS (mean 17), massive LAE, PASP 48, trivial effusion   . Peptic ulcer disease   . Pneumonia 10/21/2017  . S/P aortic valve replacement with metallic valve 5/40/0867   21 mm Sorin [employer] bileaflet mechanical valve  . S/P mitral valve replacement with metallic valve 11/07/5091   31 mm [employer] Optiform bileaflet mechanical valve  . Stroke [facility])    per patient "they said i had a small stroke but i couldnt even tell"  . Valvular heart disease (Aortic Stenosis and Mitral Stenosis) 09/25/2016   s/p mechanical AVR and MVR 11/2018 // Echo 02/2019: EF 65-70, mild LVH, mild LAE, normally functioning mechanical mitral valve prosthesis with no regurgitation or stenosis, trivial TR, normally functioning mechanical aortic valve prosthesis without regurgitation or stenosis     Past Surgical  History:  Procedure Laterality Date  . AORTIC VALVE REPLACEMENT  12/02/2018   AORTIC VALVE REPLACEMENT (AVR) USING CARBOMEDICS SUPRA-ANNULAR TOP HAT SIZE 21MM (N/A)  . AORTIC VALVE REPLACEMENT N/A 12/02/2018   Procedure: AORTIC VALVE REPLACEMENT (AVR) USING CARBOMEDICS SUPRA-ANNULAR TOP HAT SIZE 21MM;  Surgeon: Rexene Alberts, MD;  Location: [place];  Service: Open Heart Surgery;  Laterality: N/A;  . AV FISTULA PLACEMENT Right 02/25/2018   Procedure: INSERTION OF 67mm x 16cm ARTEGRAFT;  Surgeon: Waynetta Sandy, MD;  Location: [place];  Service: Vascular;  Laterality: Right;  . COLONOSCOPY W/ BIOPSIES AND POLYPECTOMY    . DIALYSIS FISTULA CREATION  2007  . ENDOMETRIAL ABLATION    . ESOPHAGOGASTRODUODENOSCOPY  N/A 07/31/2014   Procedure: ESOPHAGOGASTRODUODENOSCOPY (EGD);  Surgeon: Clarene Essex, MD;  Location: [facility] ENDOSCOPY;  Service: Endoscopy;  Laterality: N/A;  . FISTULOGRAM Right 02/25/2018   Procedure: FISTULOGRAM ARM;  Surgeon: Waynetta Sandy, MD;  Location: [place];  Service: Vascular;  Laterality: Right;  . HEMATOMA EVACUATION Right 03/06/2018   Procedure: EVACUATION HEMATOMA RIGHT UPPER ARM;  Surgeon: Waynetta Sandy, MD;  Location: [place];  Service: Vascular;  Laterality: Right;  . INSERTION OF ARTERIOVENOUS (AV) ARTEGRAFT ARM Right 09/26/2017   Procedure: INSERTION OF ARTERIOVENOUS (AV) ARTEGRAFT INTO RIGHT ARM;  Surgeon: Waynetta Sandy, MD;  Location: [place];  Service: Vascular;  Laterality: Right;  . INSERTION OF DIALYSIS CATHETER Right 03/06/2018   Procedure: INSERTION OF DIALYSIS CATHETER, right internal jugular;  Surgeon: Waynetta Sandy, MD;  Location: [place];  Service: Vascular;  Laterality: Right;  . IR THORACENTESIS ASP PLEURAL SPACE W/IMG GUIDE  01/07/2019  . MITRAL VALVE REPLACEMENT N/A 12/02/2018   Procedure: MITRAL VALVE (MV) REPLACEMENT USING CARBOMEDICS OPTIFORM SIZE 31MM;  Surgeon: Rexene Alberts, MD;  Location: [place];  Service: Open Heart Surgery;  Laterality: N/A;  . REVISON OF ARTERIOVENOUS FISTULA Right 09/26/2017   Procedure: REVISION OF ARTERIOVENOUS FISTULA RIGHT ARM WITH ARTEGRAFT;  Surgeon: Waynetta Sandy, MD;  Location: [place];  Service: Vascular;  Laterality: Right;  . REVISON OF ARTERIOVENOUS FISTULA Right 02/25/2018   Procedure: REVISION OF ARTERIOVENOUS FISTULA;  Surgeon: Waynetta Sandy, MD;  Location: [place];  Service: Vascular;  Laterality: Right;  . RIGHT/LEFT HEART CATH AND CORONARY ANGIOGRAPHY N/A 07/24/2018   Procedure: RIGHT/LEFT HEART CATH AND CORONARY ANGIOGRAPHY;  Surgeon: Larey Dresser, MD;  Location: [place] CV LAB;  Service: Cardiovascular;  Laterality: N/A;  . SHOULDER OPEN ROTATOR CUFF REPAIR Right  10/09/2016   Procedure: ROTATOR CUFF REPAIR SHOULDER OPEN partial acrominectomy and extensive synovectomy;  Surgeon: Latanya [name], MD;  Location: [facility];  Service: Orthopedics;  Laterality: Right;  RNFA  . TEE WITHOUT CARDIOVERSION N/A 01/02/2018   Procedure: TRANSESOPHAGEAL ECHOCARDIOGRAM (TEE) Bubble Study;  Surgeon: Larey Dresser, MD;  Location: [facility] ENDOSCOPY;  Service: Cardiovascular;  Laterality: N/A;  . TEE WITHOUT CARDIOVERSION N/A 12/02/2018   Procedure: TRANSESOPHAGEAL ECHOCARDIOGRAM (TEE);  Surgeon: Rexene Alberts, MD;  Location: [place];  Service: Open Heart Surgery;  Laterality: N/A;  . TUBAL LIGATION       Current Outpatient Medications:  .  amLODipine (NORVASC) 10 MG tablet, Take 1 tablet (10 mg total) by mouth at bedtime., Disp: 30 tablet, Rfl: 4 .  aspirin EC 81 MG tablet, Take 81 mg by mouth at bedtime., Disp: , Rfl:  .  heparin 1000 UNIT/ML injection, Heparin Sodium (Porcine) 1,000 Units/mL Systemic, Disp: , Rfl:  .  multivitamin (RENA-VIT) TABS  tablet, Take 1 tablet by mouth at bedtime. , Disp: , Rfl:  .  ondansetron (ZOFRAN) 4 MG tablet, Take 4 mg by mouth every 6 (six) hours as needed for nausea or vomiting., Disp: , Rfl:  .  pantoprazole (PROTONIX) 40 MG tablet, Take 40 mg by mouth 2 (two) times daily. , Disp: , Rfl:  .  temazepam (RESTORIL) 15 MG capsule, Take 15 mg by mouth at bedtime., Disp: , Rfl:  .  warfarin (COUMADIN) 3 MG tablet, Take 3 tablets by mouth daily or as directed by Coumadin Clinic., Disp: 280 tablet, Rfl: 0 .  labetalol (NORMODYNE) 300 MG tablet, Take 1 tablet (300 mg total) by mouth 2 (two) times daily. (Patient not taking: Reported on 07/02/2019), Disp: 90 tablet, Rfl: 3 .  traMADol (ULTRAM) 50 MG tablet, Take 1 tablet (50 mg total) by mouth every 12 (twelve) hours as needed for moderate pain. (Patient not taking: Reported on 07/02/2019), Disp: 20 tablet, Rfl: 0 No Known Allergies  Social History   Tobacco Use  . Smoking status: Former Smoker     Packs/day: 0.10    Years: 10.00    Pack years: 1.00    Types: Cigarettes    Start date: 10/21/2017    Quit date: 11/20/2018    Years since quitting: 0.6  . Smokeless tobacco: Never Used  Substance Use Topics  . Alcohol use: Yes    Alcohol/week: 1.0 standard drinks    Types: 1 Cans of beer per week    Comment: occasional beer    Family History  Problem Relation Age of Onset  . Liver cancer Maternal Grandmother   . Lymphoma Maternal Aunt   . Hypertension Mother   . Renal Disease Father   . Hypertension Father   . Heart attack Neg Hx       Review of Systems  Constitutional: negative for fatigue and weight loss Respiratory: negative for cough and wheezing Cardiovascular: negative for chest pain, fatigue and palpitations Gastrointestinal: negative for abdominal pain and change in bowel habits Musculoskeletal:negative for myalgias Neurological: negative for gait problems and tremors Behavioral/Psych: negative for abusive relationship, depression Endocrine: negative for temperature intolerance    Genitourinary:negative for abnormal menstrual periods, genital lesions, hot flashes, sexual problems and vaginal discharge Integument/breast: negative for breast lump, breast tenderness, nipple discharge and skin lesion(s)    Objective:       BP 136/83   Pulse 97   Ht 5\' 6"  (1.676 m)   Wt 137 lb 3.2 oz (62.2 kg)   LMP 12/05/2010 (LMP Unknown)   BMI 22.14 kg/m  General:   alert  Skin:   no rash or abnormalities  Lungs:   clear to auscultation bilaterally  Heart:   regular rate and rhythm, S1, S2 normal, no murmur, click, rub or gallop  Breasts:   normal without suspicious masses, skin or nipple changes or axillary nodes  Abdomen:  normal findings: no organomegaly, soft, non-tender and no hernia  Pelvis:  External genitalia: normal general appearance Urinary system: urethral meatus normal and bladder without fullness, nontender Vaginal: normal without tenderness, induration or  masses Cervix: normal appearance Adnexa: normal bimanual exam Uterus: anteverted and non-tender, normal size   Lab Review Urine pregnancy test Labs reviewed yes Radiologic studies reviewed yes  50% of 25 min visit spent on counseling and coordination of care.   Assessment:     1. Encounter for routine gynecological examination with Papanicolaou smear of cervix Rx: - Cytology - PAP( [place])  2.  Vaginal discharge Rx: - Cervicovaginal ancillary only( [place])  3. Vaginal irritation - probable yeast, but exam is negative  4. Screening breast examination Rx: - MM Digital Screening; Future    Plan:    Education reviewed: calcium supplements, depression evaluation, low fat, low cholesterol diet, safe sex/STD prevention, self breast exams and weight bearing exercise. Mammogram ordered. Follow up in: 1 year.   No orders of the defined types were placed in this encounter.  Orders Placed This Encounter  Procedures  . MM Digital Screening    Standing Status:   Future    Standing Expiration Date:   08/29/2020    Order Specific Question:   Reason for Exam (SYMPTOM  OR DIAGNOSIS REQUIRED)    Answer:   Screening    Order Specific Question:   Is the patient pregnant?    Answer:   No    Order Specific Question:   Preferred imaging location?    Answer:   [name]    Shelly Bombard, MD 07/02/2019 11:26 AM

## 2019-07-02 NOTE — Progress Notes (Signed)
Pt is here to re-establish care. Here for annual exam and vaginal itching that she reports has been going on for a couple of months. Pt is due for MMG. Pt reports last colonoscopy was 2 years ago, normal.

## 2019-07-03 DIAGNOSIS — R112 Nausea with vomiting, unspecified: Secondary | ICD-10-CM | POA: Diagnosis not present

## 2019-07-03 DIAGNOSIS — R150 Incomplete defecation: Secondary | ICD-10-CM | POA: Diagnosis not present

## 2019-07-03 DIAGNOSIS — Z992 Dependence on renal dialysis: Secondary | ICD-10-CM | POA: Diagnosis not present

## 2019-07-03 DIAGNOSIS — N2581 Secondary hyperparathyroidism of renal origin: Secondary | ICD-10-CM | POA: Diagnosis not present

## 2019-07-03 DIAGNOSIS — D688 Other specified coagulation defects: Secondary | ICD-10-CM | POA: Diagnosis not present

## 2019-07-03 DIAGNOSIS — Z86010 Personal history of colonic polyps: Secondary | ICD-10-CM | POA: Diagnosis not present

## 2019-07-03 DIAGNOSIS — K219 Gastro-esophageal reflux disease without esophagitis: Secondary | ICD-10-CM | POA: Diagnosis not present

## 2019-07-03 DIAGNOSIS — R519 Headache, unspecified: Secondary | ICD-10-CM | POA: Diagnosis not present

## 2019-07-03 DIAGNOSIS — M3210 Systemic lupus erythematosus, organ or system involvement unspecified: Secondary | ICD-10-CM | POA: Diagnosis not present

## 2019-07-03 DIAGNOSIS — D631 Anemia in chronic kidney disease: Secondary | ICD-10-CM | POA: Diagnosis not present

## 2019-07-03 DIAGNOSIS — N186 End stage renal disease: Secondary | ICD-10-CM | POA: Diagnosis not present

## 2019-07-03 LAB — CERVICOVAGINAL ANCILLARY ONLY
Bacterial Vaginitis (gardnerella): POSITIVE — AB
Candida Glabrata: NEGATIVE
Candida Vaginitis: NEGATIVE
Chlamydia: NEGATIVE
Comment: NEGATIVE
Comment: NEGATIVE
Comment: NEGATIVE
Comment: NEGATIVE
Comment: NEGATIVE
Comment: NORMAL
Neisseria Gonorrhea: NEGATIVE
Trichomonas: NEGATIVE

## 2019-07-05 ENCOUNTER — Other Ambulatory Visit: Payer: Self-pay | Admitting: Obstetrics

## 2019-07-05 DIAGNOSIS — B9689 Other specified bacterial agents as the cause of diseases classified elsewhere: Secondary | ICD-10-CM

## 2019-07-05 MED ORDER — METRONIDAZOLE 500 MG PO TABS: 500.0000 mg | ORAL_TABLET | Freq: Two times a day (BID) | ORAL | 2 refills | Status: DC

## 2019-07-06 DIAGNOSIS — R519 Headache, unspecified: Secondary | ICD-10-CM | POA: Diagnosis not present

## 2019-07-06 DIAGNOSIS — D631 Anemia in chronic kidney disease: Secondary | ICD-10-CM | POA: Diagnosis not present

## 2019-07-06 DIAGNOSIS — D688 Other specified coagulation defects: Secondary | ICD-10-CM | POA: Diagnosis not present

## 2019-07-06 DIAGNOSIS — M3210 Systemic lupus erythematosus, organ or system involvement unspecified: Secondary | ICD-10-CM | POA: Diagnosis not present

## 2019-07-06 DIAGNOSIS — Z992 Dependence on renal dialysis: Secondary | ICD-10-CM | POA: Diagnosis not present

## 2019-07-06 DIAGNOSIS — N2581 Secondary hyperparathyroidism of renal origin: Secondary | ICD-10-CM | POA: Diagnosis not present

## 2019-07-06 DIAGNOSIS — N186 End stage renal disease: Secondary | ICD-10-CM | POA: Diagnosis not present

## 2019-07-08 DIAGNOSIS — M3210 Systemic lupus erythematosus, organ or system involvement unspecified: Secondary | ICD-10-CM | POA: Diagnosis not present

## 2019-07-08 DIAGNOSIS — N186 End stage renal disease: Secondary | ICD-10-CM | POA: Diagnosis not present

## 2019-07-08 DIAGNOSIS — Z992 Dependence on renal dialysis: Secondary | ICD-10-CM | POA: Diagnosis not present

## 2019-07-08 DIAGNOSIS — R519 Headache, unspecified: Secondary | ICD-10-CM | POA: Diagnosis not present

## 2019-07-08 DIAGNOSIS — D631 Anemia in chronic kidney disease: Secondary | ICD-10-CM | POA: Diagnosis not present

## 2019-07-08 DIAGNOSIS — D688 Other specified coagulation defects: Secondary | ICD-10-CM | POA: Diagnosis not present

## 2019-07-08 DIAGNOSIS — N2581 Secondary hyperparathyroidism of renal origin: Secondary | ICD-10-CM | POA: Diagnosis not present

## 2019-07-09 DIAGNOSIS — J9811 Atelectasis: Secondary | ICD-10-CM | POA: Diagnosis not present

## 2019-07-09 DIAGNOSIS — I050 Rheumatic mitral stenosis: Secondary | ICD-10-CM | POA: Diagnosis not present

## 2019-07-09 DIAGNOSIS — I350 Nonrheumatic aortic (valve) stenosis: Secondary | ICD-10-CM | POA: Diagnosis not present

## 2019-07-09 LAB — CYTOLOGY - PAP
Comment: NEGATIVE
Diagnosis: NEGATIVE
HPV 16: NEGATIVE
HPV 18 / 45: NEGATIVE
High risk HPV: POSITIVE — AB

## 2019-07-10 ENCOUNTER — Other Ambulatory Visit: Payer: Self-pay | Admitting: Obstetrics

## 2019-07-10 DIAGNOSIS — D631 Anemia in chronic kidney disease: Secondary | ICD-10-CM | POA: Diagnosis not present

## 2019-07-10 DIAGNOSIS — R519 Headache, unspecified: Secondary | ICD-10-CM | POA: Diagnosis not present

## 2019-07-10 DIAGNOSIS — N2581 Secondary hyperparathyroidism of renal origin: Secondary | ICD-10-CM | POA: Diagnosis not present

## 2019-07-10 DIAGNOSIS — D688 Other specified coagulation defects: Secondary | ICD-10-CM | POA: Diagnosis not present

## 2019-07-10 DIAGNOSIS — N186 End stage renal disease: Secondary | ICD-10-CM | POA: Diagnosis not present

## 2019-07-10 DIAGNOSIS — M3210 Systemic lupus erythematosus, organ or system involvement unspecified: Secondary | ICD-10-CM | POA: Diagnosis not present

## 2019-07-10 DIAGNOSIS — Z992 Dependence on renal dialysis: Secondary | ICD-10-CM | POA: Diagnosis not present

## 2019-07-13 DIAGNOSIS — D688 Other specified coagulation defects: Secondary | ICD-10-CM | POA: Diagnosis not present

## 2019-07-13 DIAGNOSIS — D631 Anemia in chronic kidney disease: Secondary | ICD-10-CM | POA: Diagnosis not present

## 2019-07-13 DIAGNOSIS — N2581 Secondary hyperparathyroidism of renal origin: Secondary | ICD-10-CM | POA: Diagnosis not present

## 2019-07-13 DIAGNOSIS — M3210 Systemic lupus erythematosus, organ or system involvement unspecified: Secondary | ICD-10-CM | POA: Diagnosis not present

## 2019-07-13 DIAGNOSIS — R519 Headache, unspecified: Secondary | ICD-10-CM | POA: Diagnosis not present

## 2019-07-13 DIAGNOSIS — Z992 Dependence on renal dialysis: Secondary | ICD-10-CM | POA: Diagnosis not present

## 2019-07-13 DIAGNOSIS — N186 End stage renal disease: Secondary | ICD-10-CM | POA: Diagnosis not present

## 2019-07-14 ENCOUNTER — Other Ambulatory Visit: Payer: Self-pay

## 2019-07-14 ENCOUNTER — Ambulatory Visit (INDEPENDENT_AMBULATORY_CARE_PROVIDER_SITE_OTHER): Payer: Medicare HMO | Admitting: *Deleted

## 2019-07-14 DIAGNOSIS — Z5181 Encounter for therapeutic drug level monitoring: Secondary | ICD-10-CM | POA: Diagnosis not present

## 2019-07-14 DIAGNOSIS — Z954 Presence of other heart-valve replacement: Secondary | ICD-10-CM

## 2019-07-14 LAB — POCT INR: INR: 1.9 — AB (ref 2.0–3.0)

## 2019-07-14 NOTE — Patient Instructions (Signed)
Description   Today take 3.5 tablets then continue taking 3 tablets daily. Recheck INR in 4 weeks. Call Coumadin clinic with any problems [phone].

## 2019-07-15 DIAGNOSIS — D688 Other specified coagulation defects: Secondary | ICD-10-CM | POA: Diagnosis not present

## 2019-07-15 DIAGNOSIS — N186 End stage renal disease: Secondary | ICD-10-CM | POA: Diagnosis not present

## 2019-07-15 DIAGNOSIS — N2581 Secondary hyperparathyroidism of renal origin: Secondary | ICD-10-CM | POA: Diagnosis not present

## 2019-07-15 DIAGNOSIS — M3210 Systemic lupus erythematosus, organ or system involvement unspecified: Secondary | ICD-10-CM | POA: Diagnosis not present

## 2019-07-15 DIAGNOSIS — Z992 Dependence on renal dialysis: Secondary | ICD-10-CM | POA: Diagnosis not present

## 2019-07-15 DIAGNOSIS — D631 Anemia in chronic kidney disease: Secondary | ICD-10-CM | POA: Diagnosis not present

## 2019-07-15 DIAGNOSIS — R519 Headache, unspecified: Secondary | ICD-10-CM | POA: Diagnosis not present

## 2019-07-17 DIAGNOSIS — N186 End stage renal disease: Secondary | ICD-10-CM | POA: Diagnosis not present

## 2019-07-17 DIAGNOSIS — D688 Other specified coagulation defects: Secondary | ICD-10-CM | POA: Diagnosis not present

## 2019-07-17 DIAGNOSIS — D631 Anemia in chronic kidney disease: Secondary | ICD-10-CM | POA: Diagnosis not present

## 2019-07-17 DIAGNOSIS — M3210 Systemic lupus erythematosus, organ or system involvement unspecified: Secondary | ICD-10-CM | POA: Diagnosis not present

## 2019-07-17 DIAGNOSIS — R519 Headache, unspecified: Secondary | ICD-10-CM | POA: Diagnosis not present

## 2019-07-17 DIAGNOSIS — Z992 Dependence on renal dialysis: Secondary | ICD-10-CM | POA: Diagnosis not present

## 2019-07-17 DIAGNOSIS — N2581 Secondary hyperparathyroidism of renal origin: Secondary | ICD-10-CM | POA: Diagnosis not present

## 2019-07-19 DIAGNOSIS — I120 Hypertensive chronic kidney disease with stage 5 chronic kidney disease or end stage renal disease: Secondary | ICD-10-CM | POA: Diagnosis not present

## 2019-07-19 DIAGNOSIS — N186 End stage renal disease: Secondary | ICD-10-CM | POA: Diagnosis not present

## 2019-07-19 DIAGNOSIS — Z992 Dependence on renal dialysis: Secondary | ICD-10-CM | POA: Diagnosis not present

## 2019-07-20 DIAGNOSIS — M3210 Systemic lupus erythematosus, organ or system involvement unspecified: Secondary | ICD-10-CM | POA: Diagnosis not present

## 2019-07-20 DIAGNOSIS — D688 Other specified coagulation defects: Secondary | ICD-10-CM | POA: Diagnosis not present

## 2019-07-20 DIAGNOSIS — N186 End stage renal disease: Secondary | ICD-10-CM | POA: Diagnosis not present

## 2019-07-20 DIAGNOSIS — Z992 Dependence on renal dialysis: Secondary | ICD-10-CM | POA: Diagnosis not present

## 2019-07-20 DIAGNOSIS — N2581 Secondary hyperparathyroidism of renal origin: Secondary | ICD-10-CM | POA: Diagnosis not present

## 2019-07-20 DIAGNOSIS — R52 Pain, unspecified: Secondary | ICD-10-CM | POA: Diagnosis not present

## 2019-07-20 DIAGNOSIS — R519 Headache, unspecified: Secondary | ICD-10-CM | POA: Diagnosis not present

## 2019-07-22 DIAGNOSIS — N186 End stage renal disease: Secondary | ICD-10-CM | POA: Diagnosis not present

## 2019-07-22 DIAGNOSIS — N2581 Secondary hyperparathyroidism of renal origin: Secondary | ICD-10-CM | POA: Diagnosis not present

## 2019-07-22 DIAGNOSIS — D688 Other specified coagulation defects: Secondary | ICD-10-CM | POA: Diagnosis not present

## 2019-07-22 DIAGNOSIS — Z992 Dependence on renal dialysis: Secondary | ICD-10-CM | POA: Diagnosis not present

## 2019-07-22 DIAGNOSIS — R52 Pain, unspecified: Secondary | ICD-10-CM | POA: Diagnosis not present

## 2019-07-22 DIAGNOSIS — M3210 Systemic lupus erythematosus, organ or system involvement unspecified: Secondary | ICD-10-CM | POA: Diagnosis not present

## 2019-07-22 DIAGNOSIS — R519 Headache, unspecified: Secondary | ICD-10-CM | POA: Diagnosis not present

## 2019-07-24 DIAGNOSIS — D688 Other specified coagulation defects: Secondary | ICD-10-CM | POA: Diagnosis not present

## 2019-07-24 DIAGNOSIS — N186 End stage renal disease: Secondary | ICD-10-CM | POA: Diagnosis not present

## 2019-07-24 DIAGNOSIS — R52 Pain, unspecified: Secondary | ICD-10-CM | POA: Diagnosis not present

## 2019-07-24 DIAGNOSIS — R519 Headache, unspecified: Secondary | ICD-10-CM | POA: Diagnosis not present

## 2019-07-24 DIAGNOSIS — M3210 Systemic lupus erythematosus, organ or system involvement unspecified: Secondary | ICD-10-CM | POA: Diagnosis not present

## 2019-07-24 DIAGNOSIS — Z992 Dependence on renal dialysis: Secondary | ICD-10-CM | POA: Diagnosis not present

## 2019-07-24 DIAGNOSIS — N2581 Secondary hyperparathyroidism of renal origin: Secondary | ICD-10-CM | POA: Diagnosis not present

## 2019-07-27 DIAGNOSIS — R519 Headache, unspecified: Secondary | ICD-10-CM | POA: Diagnosis not present

## 2019-07-27 DIAGNOSIS — R52 Pain, unspecified: Secondary | ICD-10-CM | POA: Diagnosis not present

## 2019-07-27 DIAGNOSIS — M3210 Systemic lupus erythematosus, organ or system involvement unspecified: Secondary | ICD-10-CM | POA: Diagnosis not present

## 2019-07-27 DIAGNOSIS — Z992 Dependence on renal dialysis: Secondary | ICD-10-CM | POA: Diagnosis not present

## 2019-07-27 DIAGNOSIS — N2581 Secondary hyperparathyroidism of renal origin: Secondary | ICD-10-CM | POA: Diagnosis not present

## 2019-07-27 DIAGNOSIS — N186 End stage renal disease: Secondary | ICD-10-CM | POA: Diagnosis not present

## 2019-07-27 DIAGNOSIS — D688 Other specified coagulation defects: Secondary | ICD-10-CM | POA: Diagnosis not present

## 2019-07-29 ENCOUNTER — Telehealth: Payer: Self-pay | Admitting: *Deleted

## 2019-07-29 DIAGNOSIS — R52 Pain, unspecified: Secondary | ICD-10-CM | POA: Diagnosis not present

## 2019-07-29 DIAGNOSIS — N186 End stage renal disease: Secondary | ICD-10-CM | POA: Diagnosis not present

## 2019-07-29 DIAGNOSIS — D688 Other specified coagulation defects: Secondary | ICD-10-CM | POA: Diagnosis not present

## 2019-07-29 DIAGNOSIS — M3210 Systemic lupus erythematosus, organ or system involvement unspecified: Secondary | ICD-10-CM | POA: Diagnosis not present

## 2019-07-29 DIAGNOSIS — R519 Headache, unspecified: Secondary | ICD-10-CM | POA: Diagnosis not present

## 2019-07-29 DIAGNOSIS — N2581 Secondary hyperparathyroidism of renal origin: Secondary | ICD-10-CM | POA: Diagnosis not present

## 2019-07-29 DIAGNOSIS — Z992 Dependence on renal dialysis: Secondary | ICD-10-CM | POA: Diagnosis not present

## 2019-07-29 NOTE — Telephone Encounter (Signed)
Follow up  Dr. Donnella Sham called from [facility] D[facility] office. She follow up regarding pt's clearance. She provided office number [phone].  Please call

## 2019-07-29 NOTE — Telephone Encounter (Signed)
I called the dental office which at this time they are closed now. Pre op call back will need to reach out to Dr. Donnella Sham @ [facility] office. See previous notes.

## 2019-07-29 NOTE — Telephone Encounter (Signed)
Patient with diagnosis of MECHANICAL MITRAL VALVE & HX OF STROKE on WARFARIN for anticoagulation.    Procedure: DENTAL  Date of procedure: TBD  CrCl = ESRD  Per office protocol  No need to hold warfarin for  1-2 dental extractions  No need to hold warfarin for cleaning ,deep cleaning, fillings, crowns or root canal.    IF more than 2 teeth extracted or surgical procedure needed, we will recommend HOLDING warfarin for 5 days and bridging with lovenox.  *PLEASE submit clearance for any other procedure not covered by this note*  Maitland Lesiak Rodriguez-Guzman PharmD, BCPS, [place] [address] 07/29/2019 2:51 PM

## 2019-07-29 NOTE — Telephone Encounter (Signed)
   [facility] Pre-operative Risk Assessment    Request for surgical clearance: I HAVE LEFT A MESSAGE FOR DENTAL OFFICE TO PLEASE CALL BACK WITH HOW MANY TEETH TO BE EXTRACTED  1. What type of surgery is being performed? DENTAL EXTRACTIONS, CLEANING, (POSSIBLE FILLINGS, CROWN, BRIDGES, ROOT CANAL THERAPY)  2. When is this surgery scheduled? TBD   3. What type of clearance is required (medical clearance vs. Pharmacy clearance to hold med vs. Both)? PHARM  4. Are there any medications that need to be held prior to surgery and how long? COUMADIN   5. Practice name and name of physician performing surgery? ERIC SADLER, DDS   6. What is your office phone number [phone]    7.   What is your office fax number [phone]  8.   Anesthesia type (None, local, MAC, general) ? LOCAL WITH EPINEPHRINE    Julaine Hua 07/29/2019, 9:46 AM  _________________________________________________________________   (provider comments below)

## 2019-07-29 NOTE — Telephone Encounter (Signed)
I have left message x 2 today to confirm how many teeth are being extracted. Asked for call back to confirm

## 2019-07-30 MED ORDER — AMOXICILLIN 500 MG PO CAPS: 2000.0000 mg | ORAL_CAPSULE | Freq: Once | ORAL | 4 refills | Status: DC | PRN

## 2019-07-30 NOTE — Telephone Encounter (Signed)
   Primary Cardiologist: Dorris Carnes, MD  Chart reviewed as part of pre-operative protocol coverage. Pt has a medical history significant for mechanical AVR and mechanical MVR in 12/4534, diastolic heart failure, CVA, HTN, ESRD on dialysis, lupus and anemia. She had no significant CAD on cath in 07/2018.   I spoke with Dr Donnella Sham who says that pt is planned for root canal first and then possible extractions in the near future. I advised that clearance to hold anticoagulation will be based on the individual circumstances and that request should be made for each procedure. Due to presence of mechanical mitral valve and history of stroke:  Per office protocol             No need to hold warfarin for  1-2 dental extractions             No need to hold warfarin for cleaning ,deep cleaning, fillings, crowns or root canal.               IF more than 2 teeth extracted or surgical procedure needed, we will recommend HOLDING warfarin for 5 days and bridging with lovenox. Please contact our office to arrange.  I called [name] and discussed the above. I also advised her that she will need prophylactic antibiotics 1 hour prior to any dental work/appointments including routine cleanings. I am providing a prescription with refills for amoxicillin. We reviewed proper use.   I will route this recommendation to the requesting party via Epic fax function and remove from pre-op pool.  Please call with questions.  Daune Perch, NP 07/30/2019, 11:30 AM

## 2019-07-30 NOTE — Telephone Encounter (Signed)
Patient states she is returning a call to [facility], following up in regards to clearance. She states she would like to make him aware that she does not have any antibiotics to take prior to / post procedure. Please advise.

## 2019-07-31 DIAGNOSIS — N186 End stage renal disease: Secondary | ICD-10-CM | POA: Diagnosis not present

## 2019-07-31 DIAGNOSIS — R52 Pain, unspecified: Secondary | ICD-10-CM | POA: Diagnosis not present

## 2019-07-31 DIAGNOSIS — M3210 Systemic lupus erythematosus, organ or system involvement unspecified: Secondary | ICD-10-CM | POA: Diagnosis not present

## 2019-07-31 DIAGNOSIS — Z992 Dependence on renal dialysis: Secondary | ICD-10-CM | POA: Diagnosis not present

## 2019-07-31 DIAGNOSIS — D688 Other specified coagulation defects: Secondary | ICD-10-CM | POA: Diagnosis not present

## 2019-07-31 DIAGNOSIS — R519 Headache, unspecified: Secondary | ICD-10-CM | POA: Diagnosis not present

## 2019-07-31 DIAGNOSIS — N2581 Secondary hyperparathyroidism of renal origin: Secondary | ICD-10-CM | POA: Diagnosis not present

## 2019-08-03 DIAGNOSIS — R519 Headache, unspecified: Secondary | ICD-10-CM | POA: Diagnosis not present

## 2019-08-03 DIAGNOSIS — N2581 Secondary hyperparathyroidism of renal origin: Secondary | ICD-10-CM | POA: Diagnosis not present

## 2019-08-03 DIAGNOSIS — Z992 Dependence on renal dialysis: Secondary | ICD-10-CM | POA: Diagnosis not present

## 2019-08-03 DIAGNOSIS — M3210 Systemic lupus erythematosus, organ or system involvement unspecified: Secondary | ICD-10-CM | POA: Diagnosis not present

## 2019-08-03 DIAGNOSIS — D688 Other specified coagulation defects: Secondary | ICD-10-CM | POA: Diagnosis not present

## 2019-08-03 DIAGNOSIS — R52 Pain, unspecified: Secondary | ICD-10-CM | POA: Diagnosis not present

## 2019-08-03 DIAGNOSIS — N186 End stage renal disease: Secondary | ICD-10-CM | POA: Diagnosis not present

## 2019-08-07 DIAGNOSIS — R519 Headache, unspecified: Secondary | ICD-10-CM | POA: Diagnosis not present

## 2019-08-07 DIAGNOSIS — Z992 Dependence on renal dialysis: Secondary | ICD-10-CM | POA: Diagnosis not present

## 2019-08-07 DIAGNOSIS — D688 Other specified coagulation defects: Secondary | ICD-10-CM | POA: Diagnosis not present

## 2019-08-07 DIAGNOSIS — N2581 Secondary hyperparathyroidism of renal origin: Secondary | ICD-10-CM | POA: Diagnosis not present

## 2019-08-07 DIAGNOSIS — R52 Pain, unspecified: Secondary | ICD-10-CM | POA: Diagnosis not present

## 2019-08-07 DIAGNOSIS — M3210 Systemic lupus erythematosus, organ or system involvement unspecified: Secondary | ICD-10-CM | POA: Diagnosis not present

## 2019-08-07 DIAGNOSIS — N186 End stage renal disease: Secondary | ICD-10-CM | POA: Diagnosis not present

## 2019-08-10 DIAGNOSIS — M3210 Systemic lupus erythematosus, organ or system involvement unspecified: Secondary | ICD-10-CM | POA: Diagnosis not present

## 2019-08-10 DIAGNOSIS — R52 Pain, unspecified: Secondary | ICD-10-CM | POA: Diagnosis not present

## 2019-08-10 DIAGNOSIS — D688 Other specified coagulation defects: Secondary | ICD-10-CM | POA: Diagnosis not present

## 2019-08-10 DIAGNOSIS — N186 End stage renal disease: Secondary | ICD-10-CM | POA: Diagnosis not present

## 2019-08-10 DIAGNOSIS — N2581 Secondary hyperparathyroidism of renal origin: Secondary | ICD-10-CM | POA: Diagnosis not present

## 2019-08-10 DIAGNOSIS — Z992 Dependence on renal dialysis: Secondary | ICD-10-CM | POA: Diagnosis not present

## 2019-08-10 DIAGNOSIS — R519 Headache, unspecified: Secondary | ICD-10-CM | POA: Diagnosis not present

## 2019-08-11 ENCOUNTER — Ambulatory Visit: Payer: Medicare HMO

## 2019-08-12 DIAGNOSIS — D688 Other specified coagulation defects: Secondary | ICD-10-CM | POA: Diagnosis not present

## 2019-08-12 DIAGNOSIS — M3210 Systemic lupus erythematosus, organ or system involvement unspecified: Secondary | ICD-10-CM | POA: Diagnosis not present

## 2019-08-12 DIAGNOSIS — R52 Pain, unspecified: Secondary | ICD-10-CM | POA: Diagnosis not present

## 2019-08-12 DIAGNOSIS — R519 Headache, unspecified: Secondary | ICD-10-CM | POA: Diagnosis not present

## 2019-08-12 DIAGNOSIS — N2581 Secondary hyperparathyroidism of renal origin: Secondary | ICD-10-CM | POA: Diagnosis not present

## 2019-08-12 DIAGNOSIS — N186 End stage renal disease: Secondary | ICD-10-CM | POA: Diagnosis not present

## 2019-08-12 DIAGNOSIS — Z992 Dependence on renal dialysis: Secondary | ICD-10-CM | POA: Diagnosis not present

## 2019-08-13 ENCOUNTER — Other Ambulatory Visit: Payer: Self-pay

## 2019-08-13 ENCOUNTER — Ambulatory Visit (INDEPENDENT_AMBULATORY_CARE_PROVIDER_SITE_OTHER): Payer: Medicare HMO

## 2019-08-13 ENCOUNTER — Ambulatory Visit: Payer: Medicare HMO

## 2019-08-13 DIAGNOSIS — Z954 Presence of other heart-valve replacement: Secondary | ICD-10-CM | POA: Diagnosis not present

## 2019-08-13 DIAGNOSIS — Z5181 Encounter for therapeutic drug level monitoring: Secondary | ICD-10-CM | POA: Diagnosis not present

## 2019-08-13 LAB — POCT INR: INR: 7.7 — AB (ref 2.0–3.0)

## 2019-08-13 LAB — PROTIME-INR
INR: 7 (ref 0.9–1.2)
Prothrombin Time: 74.4 s — ABNORMAL HIGH (ref 9.1–12.0)

## 2019-08-13 NOTE — Patient Instructions (Signed)
Description   Sent for STAT lab, INR 7.7 on POC machine, 7.0 at the lab.  Pt has not taken today's dosage of Warfarin, instructed pt to hold Warfarin today, will call with further instructions once results back from lab.  Go to the ED with bleeding problems. Called pt advised to Hold Warfarin x 3 dosages, then start taking 3 tablets daily except 2 tablets on Mondays and Fridays. Recheck INR on Tuesday. Call Coumadin clinic with any problems [phone].

## 2019-08-14 DIAGNOSIS — R519 Headache, unspecified: Secondary | ICD-10-CM | POA: Diagnosis not present

## 2019-08-14 DIAGNOSIS — M3210 Systemic lupus erythematosus, organ or system involvement unspecified: Secondary | ICD-10-CM | POA: Diagnosis not present

## 2019-08-14 DIAGNOSIS — Z992 Dependence on renal dialysis: Secondary | ICD-10-CM | POA: Diagnosis not present

## 2019-08-14 DIAGNOSIS — N2581 Secondary hyperparathyroidism of renal origin: Secondary | ICD-10-CM | POA: Diagnosis not present

## 2019-08-14 DIAGNOSIS — R52 Pain, unspecified: Secondary | ICD-10-CM | POA: Diagnosis not present

## 2019-08-14 DIAGNOSIS — D688 Other specified coagulation defects: Secondary | ICD-10-CM | POA: Diagnosis not present

## 2019-08-14 DIAGNOSIS — N186 End stage renal disease: Secondary | ICD-10-CM | POA: Diagnosis not present

## 2019-08-17 DIAGNOSIS — Z992 Dependence on renal dialysis: Secondary | ICD-10-CM | POA: Diagnosis not present

## 2019-08-17 DIAGNOSIS — N2581 Secondary hyperparathyroidism of renal origin: Secondary | ICD-10-CM | POA: Diagnosis not present

## 2019-08-17 DIAGNOSIS — R52 Pain, unspecified: Secondary | ICD-10-CM | POA: Diagnosis not present

## 2019-08-17 DIAGNOSIS — N186 End stage renal disease: Secondary | ICD-10-CM | POA: Diagnosis not present

## 2019-08-17 DIAGNOSIS — R519 Headache, unspecified: Secondary | ICD-10-CM | POA: Diagnosis not present

## 2019-08-17 DIAGNOSIS — D688 Other specified coagulation defects: Secondary | ICD-10-CM | POA: Diagnosis not present

## 2019-08-17 DIAGNOSIS — M3210 Systemic lupus erythematosus, organ or system involvement unspecified: Secondary | ICD-10-CM | POA: Diagnosis not present

## 2019-08-18 ENCOUNTER — Ambulatory Visit (INDEPENDENT_AMBULATORY_CARE_PROVIDER_SITE_OTHER): Payer: Medicare HMO | Admitting: *Deleted

## 2019-08-18 ENCOUNTER — Other Ambulatory Visit: Payer: Self-pay

## 2019-08-18 DIAGNOSIS — Z5181 Encounter for therapeutic drug level monitoring: Secondary | ICD-10-CM | POA: Diagnosis not present

## 2019-08-18 DIAGNOSIS — Z954 Presence of other heart-valve replacement: Secondary | ICD-10-CM | POA: Diagnosis not present

## 2019-08-18 LAB — POCT INR: INR: 1.6 — AB (ref 2.0–3.0)

## 2019-08-18 NOTE — Patient Instructions (Signed)
Description   Take 4 tablets today and tomorrow then continue taking 3 tablets daily except 2 tablets on Mondays and Fridays. Recheck INR in 1 week. Call Coumadin clinic with any problems [phone].

## 2019-08-19 ENCOUNTER — Other Ambulatory Visit: Payer: Self-pay | Admitting: Internal Medicine

## 2019-08-19 DIAGNOSIS — R52 Pain, unspecified: Secondary | ICD-10-CM | POA: Diagnosis not present

## 2019-08-19 DIAGNOSIS — R519 Headache, unspecified: Secondary | ICD-10-CM | POA: Diagnosis not present

## 2019-08-19 DIAGNOSIS — M3210 Systemic lupus erythematosus, organ or system involvement unspecified: Secondary | ICD-10-CM | POA: Diagnosis not present

## 2019-08-19 DIAGNOSIS — I120 Hypertensive chronic kidney disease with stage 5 chronic kidney disease or end stage renal disease: Secondary | ICD-10-CM | POA: Diagnosis not present

## 2019-08-19 DIAGNOSIS — D688 Other specified coagulation defects: Secondary | ICD-10-CM | POA: Diagnosis not present

## 2019-08-19 DIAGNOSIS — N186 End stage renal disease: Secondary | ICD-10-CM | POA: Diagnosis not present

## 2019-08-19 DIAGNOSIS — N2581 Secondary hyperparathyroidism of renal origin: Secondary | ICD-10-CM | POA: Diagnosis not present

## 2019-08-19 DIAGNOSIS — Z992 Dependence on renal dialysis: Secondary | ICD-10-CM | POA: Diagnosis not present

## 2019-08-20 DIAGNOSIS — I050 Rheumatic mitral stenosis: Secondary | ICD-10-CM | POA: Diagnosis not present

## 2019-08-20 DIAGNOSIS — I350 Nonrheumatic aortic (valve) stenosis: Secondary | ICD-10-CM | POA: Diagnosis not present

## 2019-08-20 DIAGNOSIS — J9811 Atelectasis: Secondary | ICD-10-CM | POA: Diagnosis not present

## 2019-08-21 DIAGNOSIS — D688 Other specified coagulation defects: Secondary | ICD-10-CM | POA: Diagnosis not present

## 2019-08-21 DIAGNOSIS — N2581 Secondary hyperparathyroidism of renal origin: Secondary | ICD-10-CM | POA: Diagnosis not present

## 2019-08-21 DIAGNOSIS — M3210 Systemic lupus erythematosus, organ or system involvement unspecified: Secondary | ICD-10-CM | POA: Diagnosis not present

## 2019-08-21 DIAGNOSIS — N186 End stage renal disease: Secondary | ICD-10-CM | POA: Diagnosis not present

## 2019-08-21 DIAGNOSIS — D631 Anemia in chronic kidney disease: Secondary | ICD-10-CM | POA: Diagnosis not present

## 2019-08-21 DIAGNOSIS — Z992 Dependence on renal dialysis: Secondary | ICD-10-CM | POA: Diagnosis not present

## 2019-08-21 DIAGNOSIS — R519 Headache, unspecified: Secondary | ICD-10-CM | POA: Diagnosis not present

## 2019-08-24 DIAGNOSIS — R519 Headache, unspecified: Secondary | ICD-10-CM | POA: Diagnosis not present

## 2019-08-24 DIAGNOSIS — N186 End stage renal disease: Secondary | ICD-10-CM | POA: Diagnosis not present

## 2019-08-24 DIAGNOSIS — Z992 Dependence on renal dialysis: Secondary | ICD-10-CM | POA: Diagnosis not present

## 2019-08-24 DIAGNOSIS — M3210 Systemic lupus erythematosus, organ or system involvement unspecified: Secondary | ICD-10-CM | POA: Diagnosis not present

## 2019-08-24 DIAGNOSIS — D631 Anemia in chronic kidney disease: Secondary | ICD-10-CM | POA: Diagnosis not present

## 2019-08-24 DIAGNOSIS — D688 Other specified coagulation defects: Secondary | ICD-10-CM | POA: Diagnosis not present

## 2019-08-24 DIAGNOSIS — N2581 Secondary hyperparathyroidism of renal origin: Secondary | ICD-10-CM | POA: Diagnosis not present

## 2019-08-25 ENCOUNTER — Other Ambulatory Visit: Payer: Self-pay

## 2019-08-25 ENCOUNTER — Ambulatory Visit (INDEPENDENT_AMBULATORY_CARE_PROVIDER_SITE_OTHER): Payer: Medicare HMO

## 2019-08-25 DIAGNOSIS — Z954 Presence of other heart-valve replacement: Secondary | ICD-10-CM

## 2019-08-25 DIAGNOSIS — Z5181 Encounter for therapeutic drug level monitoring: Secondary | ICD-10-CM | POA: Diagnosis not present

## 2019-08-25 LAB — POCT INR: INR: 3.9 — AB (ref 2.0–3.0)

## 2019-08-25 NOTE — Patient Instructions (Signed)
Description   Skip today's dosage of Coumadin, then resume same dosage 3 tablets daily except 2 tablets on Mondays and Fridays. Recheck INR in 1 week. Call Coumadin clinic with any problems [phone].

## 2019-08-26 DIAGNOSIS — R519 Headache, unspecified: Secondary | ICD-10-CM | POA: Diagnosis not present

## 2019-08-26 DIAGNOSIS — D688 Other specified coagulation defects: Secondary | ICD-10-CM | POA: Diagnosis not present

## 2019-08-26 DIAGNOSIS — N2581 Secondary hyperparathyroidism of renal origin: Secondary | ICD-10-CM | POA: Diagnosis not present

## 2019-08-26 DIAGNOSIS — D631 Anemia in chronic kidney disease: Secondary | ICD-10-CM | POA: Diagnosis not present

## 2019-08-26 DIAGNOSIS — M3210 Systemic lupus erythematosus, organ or system involvement unspecified: Secondary | ICD-10-CM | POA: Diagnosis not present

## 2019-08-26 DIAGNOSIS — Z992 Dependence on renal dialysis: Secondary | ICD-10-CM | POA: Diagnosis not present

## 2019-08-26 DIAGNOSIS — N186 End stage renal disease: Secondary | ICD-10-CM | POA: Diagnosis not present

## 2019-08-28 DIAGNOSIS — M3210 Systemic lupus erythematosus, organ or system involvement unspecified: Secondary | ICD-10-CM | POA: Diagnosis not present

## 2019-08-28 DIAGNOSIS — N186 End stage renal disease: Secondary | ICD-10-CM | POA: Diagnosis not present

## 2019-08-28 DIAGNOSIS — N2581 Secondary hyperparathyroidism of renal origin: Secondary | ICD-10-CM | POA: Diagnosis not present

## 2019-08-28 DIAGNOSIS — Z992 Dependence on renal dialysis: Secondary | ICD-10-CM | POA: Diagnosis not present

## 2019-08-28 DIAGNOSIS — D688 Other specified coagulation defects: Secondary | ICD-10-CM | POA: Diagnosis not present

## 2019-08-28 DIAGNOSIS — R519 Headache, unspecified: Secondary | ICD-10-CM | POA: Diagnosis not present

## 2019-08-28 DIAGNOSIS — D631 Anemia in chronic kidney disease: Secondary | ICD-10-CM | POA: Diagnosis not present

## 2019-08-31 DIAGNOSIS — D688 Other specified coagulation defects: Secondary | ICD-10-CM | POA: Diagnosis not present

## 2019-08-31 DIAGNOSIS — N2581 Secondary hyperparathyroidism of renal origin: Secondary | ICD-10-CM | POA: Diagnosis not present

## 2019-08-31 DIAGNOSIS — D631 Anemia in chronic kidney disease: Secondary | ICD-10-CM | POA: Diagnosis not present

## 2019-08-31 DIAGNOSIS — N186 End stage renal disease: Secondary | ICD-10-CM | POA: Diagnosis not present

## 2019-08-31 DIAGNOSIS — Z992 Dependence on renal dialysis: Secondary | ICD-10-CM | POA: Diagnosis not present

## 2019-08-31 DIAGNOSIS — R519 Headache, unspecified: Secondary | ICD-10-CM | POA: Diagnosis not present

## 2019-08-31 DIAGNOSIS — M3210 Systemic lupus erythematosus, organ or system involvement unspecified: Secondary | ICD-10-CM | POA: Diagnosis not present

## 2019-09-02 DIAGNOSIS — D631 Anemia in chronic kidney disease: Secondary | ICD-10-CM | POA: Diagnosis not present

## 2019-09-02 DIAGNOSIS — N2581 Secondary hyperparathyroidism of renal origin: Secondary | ICD-10-CM | POA: Diagnosis not present

## 2019-09-02 DIAGNOSIS — R519 Headache, unspecified: Secondary | ICD-10-CM | POA: Diagnosis not present

## 2019-09-02 DIAGNOSIS — N186 End stage renal disease: Secondary | ICD-10-CM | POA: Diagnosis not present

## 2019-09-02 DIAGNOSIS — D688 Other specified coagulation defects: Secondary | ICD-10-CM | POA: Diagnosis not present

## 2019-09-02 DIAGNOSIS — M3210 Systemic lupus erythematosus, organ or system involvement unspecified: Secondary | ICD-10-CM | POA: Diagnosis not present

## 2019-09-02 DIAGNOSIS — Z992 Dependence on renal dialysis: Secondary | ICD-10-CM | POA: Diagnosis not present

## 2019-09-03 ENCOUNTER — Ambulatory Visit (INDEPENDENT_AMBULATORY_CARE_PROVIDER_SITE_OTHER): Payer: Medicare HMO | Admitting: *Deleted

## 2019-09-03 ENCOUNTER — Other Ambulatory Visit: Payer: Self-pay

## 2019-09-03 DIAGNOSIS — Z954 Presence of other heart-valve replacement: Secondary | ICD-10-CM

## 2019-09-03 DIAGNOSIS — Z5181 Encounter for therapeutic drug level monitoring: Secondary | ICD-10-CM | POA: Diagnosis not present

## 2019-09-03 LAB — POCT INR: INR: 2.5 (ref 2.0–3.0)

## 2019-09-03 NOTE — Patient Instructions (Signed)
Description   Continue same dosage 3 tablets daily except 2 tablets on Mondays and Fridays. Recheck INR in 2 week. Call Coumadin clinic with any problems [phone].

## 2019-09-04 DIAGNOSIS — Z992 Dependence on renal dialysis: Secondary | ICD-10-CM | POA: Diagnosis not present

## 2019-09-04 DIAGNOSIS — R519 Headache, unspecified: Secondary | ICD-10-CM | POA: Diagnosis not present

## 2019-09-04 DIAGNOSIS — D688 Other specified coagulation defects: Secondary | ICD-10-CM | POA: Diagnosis not present

## 2019-09-04 DIAGNOSIS — N186 End stage renal disease: Secondary | ICD-10-CM | POA: Diagnosis not present

## 2019-09-04 DIAGNOSIS — N2581 Secondary hyperparathyroidism of renal origin: Secondary | ICD-10-CM | POA: Diagnosis not present

## 2019-09-04 DIAGNOSIS — D631 Anemia in chronic kidney disease: Secondary | ICD-10-CM | POA: Diagnosis not present

## 2019-09-04 DIAGNOSIS — M3210 Systemic lupus erythematosus, organ or system involvement unspecified: Secondary | ICD-10-CM | POA: Diagnosis not present

## 2019-09-07 DIAGNOSIS — Z992 Dependence on renal dialysis: Secondary | ICD-10-CM | POA: Diagnosis not present

## 2019-09-07 DIAGNOSIS — M3210 Systemic lupus erythematosus, organ or system involvement unspecified: Secondary | ICD-10-CM | POA: Diagnosis not present

## 2019-09-07 DIAGNOSIS — D631 Anemia in chronic kidney disease: Secondary | ICD-10-CM | POA: Diagnosis not present

## 2019-09-07 DIAGNOSIS — D688 Other specified coagulation defects: Secondary | ICD-10-CM | POA: Diagnosis not present

## 2019-09-07 DIAGNOSIS — N186 End stage renal disease: Secondary | ICD-10-CM | POA: Diagnosis not present

## 2019-09-07 DIAGNOSIS — R519 Headache, unspecified: Secondary | ICD-10-CM | POA: Diagnosis not present

## 2019-09-07 DIAGNOSIS — N2581 Secondary hyperparathyroidism of renal origin: Secondary | ICD-10-CM | POA: Diagnosis not present

## 2019-09-09 DIAGNOSIS — M3210 Systemic lupus erythematosus, organ or system involvement unspecified: Secondary | ICD-10-CM | POA: Diagnosis not present

## 2019-09-09 DIAGNOSIS — R519 Headache, unspecified: Secondary | ICD-10-CM | POA: Diagnosis not present

## 2019-09-09 DIAGNOSIS — Z992 Dependence on renal dialysis: Secondary | ICD-10-CM | POA: Diagnosis not present

## 2019-09-09 DIAGNOSIS — N186 End stage renal disease: Secondary | ICD-10-CM | POA: Diagnosis not present

## 2019-09-09 DIAGNOSIS — D631 Anemia in chronic kidney disease: Secondary | ICD-10-CM | POA: Diagnosis not present

## 2019-09-09 DIAGNOSIS — N2581 Secondary hyperparathyroidism of renal origin: Secondary | ICD-10-CM | POA: Diagnosis not present

## 2019-09-09 DIAGNOSIS — D688 Other specified coagulation defects: Secondary | ICD-10-CM | POA: Diagnosis not present

## 2019-09-10 ENCOUNTER — Other Ambulatory Visit: Payer: Self-pay | Admitting: Obstetrics

## 2019-09-10 DIAGNOSIS — N6325 Unspecified lump in the left breast, overlapping quadrants: Secondary | ICD-10-CM

## 2019-09-11 DIAGNOSIS — N186 End stage renal disease: Secondary | ICD-10-CM | POA: Diagnosis not present

## 2019-09-11 DIAGNOSIS — Z992 Dependence on renal dialysis: Secondary | ICD-10-CM | POA: Diagnosis not present

## 2019-09-11 DIAGNOSIS — N2581 Secondary hyperparathyroidism of renal origin: Secondary | ICD-10-CM | POA: Diagnosis not present

## 2019-09-11 DIAGNOSIS — D688 Other specified coagulation defects: Secondary | ICD-10-CM | POA: Diagnosis not present

## 2019-09-11 DIAGNOSIS — D631 Anemia in chronic kidney disease: Secondary | ICD-10-CM | POA: Diagnosis not present

## 2019-09-11 DIAGNOSIS — R519 Headache, unspecified: Secondary | ICD-10-CM | POA: Diagnosis not present

## 2019-09-11 DIAGNOSIS — M3210 Systemic lupus erythematosus, organ or system involvement unspecified: Secondary | ICD-10-CM | POA: Diagnosis not present

## 2019-09-12 IMAGING — DX CHEST - 2 VIEW
2 series · 2 of 2 positions shown · non-contrast
Comparison: 01/08/2019

CLINICAL DATA: Pleural effusion, dialysis

EXAM:
CHEST - 2 VIEW

[chest lat]
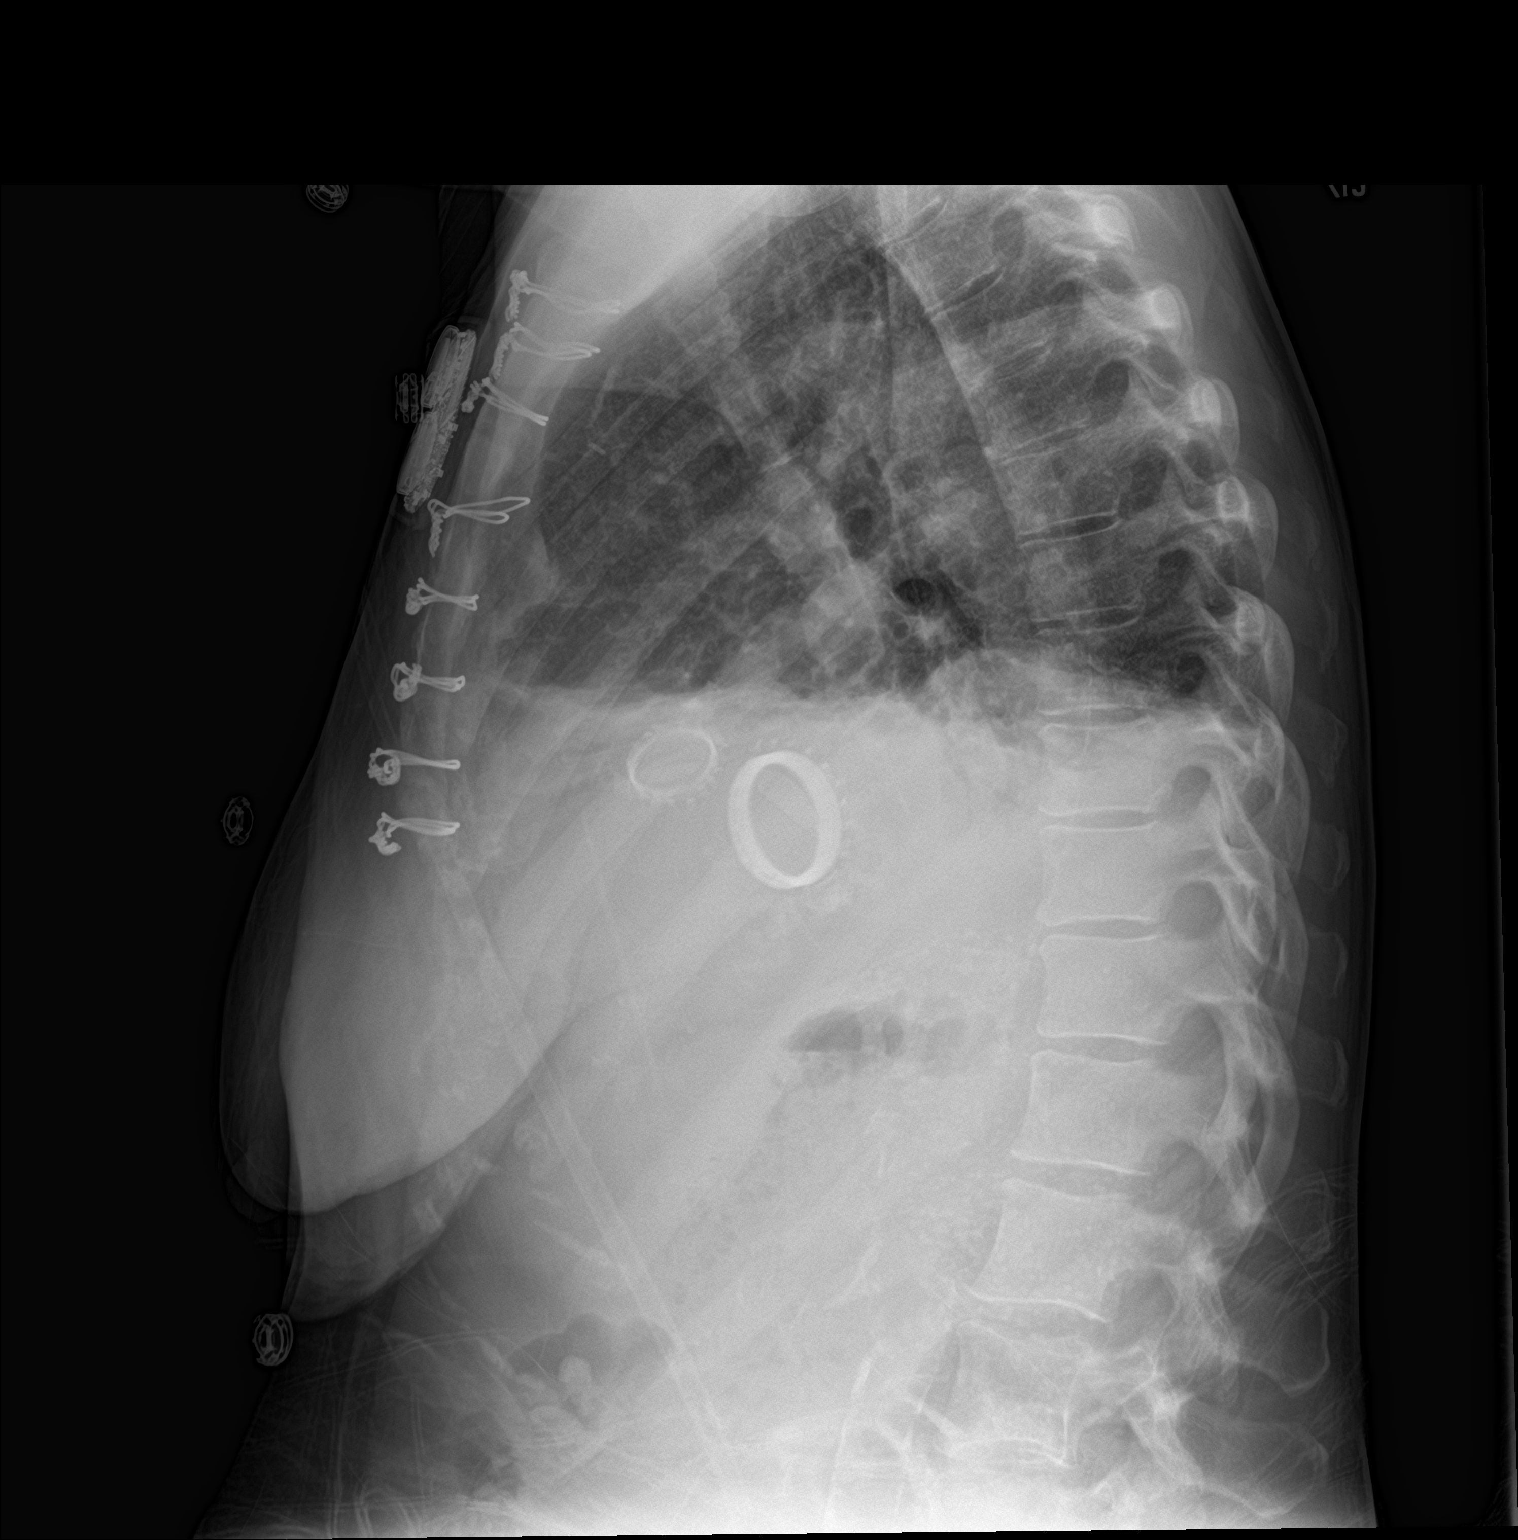

[chest ap]
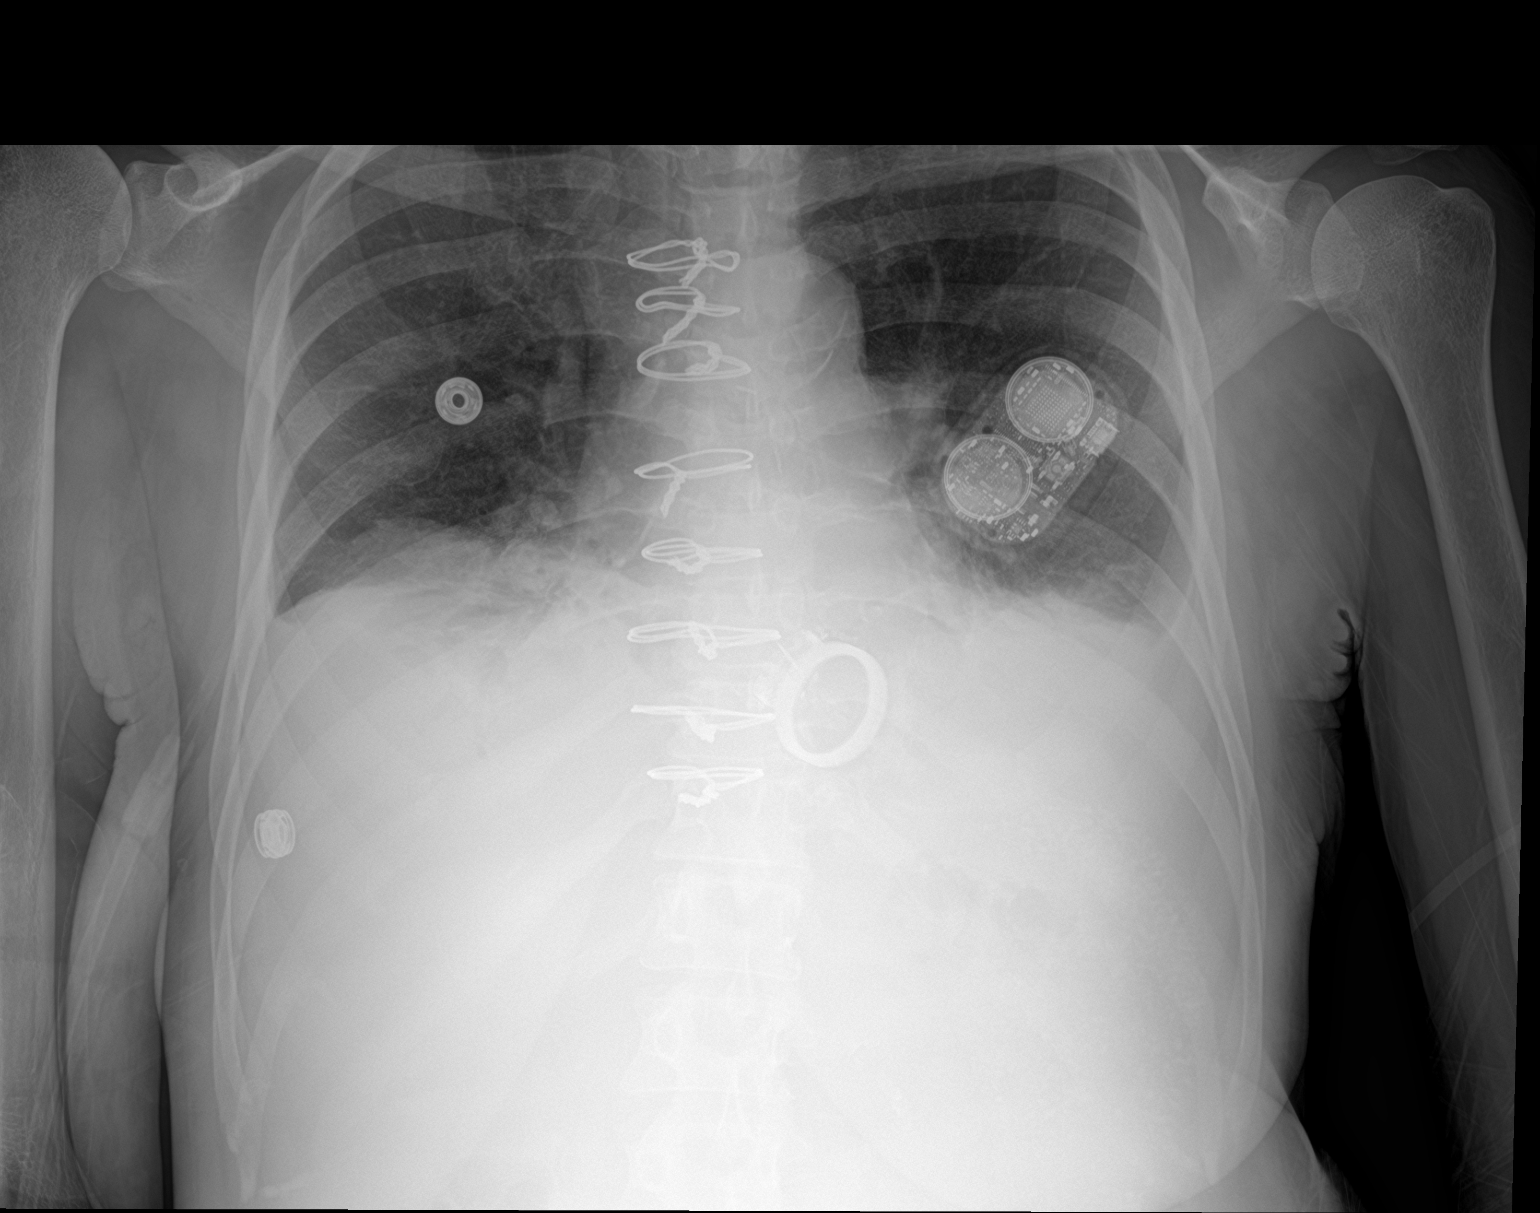

[2 of 2 positions shown; findings below may reference images not displayed]

FINDINGS: No significant change in examination with small bilateral pleural
effusions and associated atelectasis or consolidation. No focal
airspace opacity. Unchanged cardiomegaly status post median
sternotomy with valvular prostheses. Left chest loop recorder.
IMPRESSION: No significant change in examination with small bilateral pleural
effusions and associated atelectasis or consolidation. No focal
airspace opacity. Unchanged cardiomegaly status post median
sternotomy with valvular prostheses. Left chest loop recorder.

## 2019-09-14 DIAGNOSIS — N186 End stage renal disease: Secondary | ICD-10-CM | POA: Diagnosis not present

## 2019-09-14 DIAGNOSIS — D631 Anemia in chronic kidney disease: Secondary | ICD-10-CM | POA: Diagnosis not present

## 2019-09-14 DIAGNOSIS — R519 Headache, unspecified: Secondary | ICD-10-CM | POA: Diagnosis not present

## 2019-09-14 DIAGNOSIS — Z992 Dependence on renal dialysis: Secondary | ICD-10-CM | POA: Diagnosis not present

## 2019-09-14 DIAGNOSIS — M3210 Systemic lupus erythematosus, organ or system involvement unspecified: Secondary | ICD-10-CM | POA: Diagnosis not present

## 2019-09-14 DIAGNOSIS — D688 Other specified coagulation defects: Secondary | ICD-10-CM | POA: Diagnosis not present

## 2019-09-14 DIAGNOSIS — N2581 Secondary hyperparathyroidism of renal origin: Secondary | ICD-10-CM | POA: Diagnosis not present

## 2019-09-16 DIAGNOSIS — N186 End stage renal disease: Secondary | ICD-10-CM | POA: Diagnosis not present

## 2019-09-16 DIAGNOSIS — N2581 Secondary hyperparathyroidism of renal origin: Secondary | ICD-10-CM | POA: Diagnosis not present

## 2019-09-16 DIAGNOSIS — M3210 Systemic lupus erythematosus, organ or system involvement unspecified: Secondary | ICD-10-CM | POA: Diagnosis not present

## 2019-09-16 DIAGNOSIS — R519 Headache, unspecified: Secondary | ICD-10-CM | POA: Diagnosis not present

## 2019-09-16 DIAGNOSIS — D631 Anemia in chronic kidney disease: Secondary | ICD-10-CM | POA: Diagnosis not present

## 2019-09-16 DIAGNOSIS — Z992 Dependence on renal dialysis: Secondary | ICD-10-CM | POA: Diagnosis not present

## 2019-09-16 DIAGNOSIS — D688 Other specified coagulation defects: Secondary | ICD-10-CM | POA: Diagnosis not present

## 2019-09-17 ENCOUNTER — Other Ambulatory Visit: Payer: Self-pay

## 2019-09-17 ENCOUNTER — Ambulatory Visit (INDEPENDENT_AMBULATORY_CARE_PROVIDER_SITE_OTHER): Payer: Medicare HMO | Admitting: *Deleted

## 2019-09-17 DIAGNOSIS — Z5181 Encounter for therapeutic drug level monitoring: Secondary | ICD-10-CM | POA: Diagnosis not present

## 2019-09-17 DIAGNOSIS — Z954 Presence of other heart-valve replacement: Secondary | ICD-10-CM | POA: Diagnosis not present

## 2019-09-17 LAB — POCT INR: INR: 2.5 (ref 2.0–3.0)

## 2019-09-17 NOTE — Patient Instructions (Addendum)
Description   Continue same dosage 3 tablets daily except 2 tablets on Mondays and Fridays. Recheck INR in 3 weeks. Call Coumadin clinic with any problems [phone].

## 2019-09-18 ENCOUNTER — Encounter: Payer: Self-pay | Admitting: Internal Medicine

## 2019-09-18 ENCOUNTER — Ambulatory Visit (INDEPENDENT_AMBULATORY_CARE_PROVIDER_SITE_OTHER): Payer: Medicare HMO | Admitting: Internal Medicine

## 2019-09-18 VITALS — BP 144/82 | HR 96 | Ht 66.0 in | Wt 137.8 lb

## 2019-09-18 DIAGNOSIS — N186 End stage renal disease: Secondary | ICD-10-CM | POA: Diagnosis not present

## 2019-09-18 DIAGNOSIS — M3210 Systemic lupus erythematosus, organ or system involvement unspecified: Secondary | ICD-10-CM | POA: Diagnosis not present

## 2019-09-18 DIAGNOSIS — Z992 Dependence on renal dialysis: Secondary | ICD-10-CM | POA: Diagnosis not present

## 2019-09-18 DIAGNOSIS — I120 Hypertensive chronic kidney disease with stage 5 chronic kidney disease or end stage renal disease: Secondary | ICD-10-CM | POA: Diagnosis not present

## 2019-09-18 DIAGNOSIS — Z952 Presence of prosthetic heart valve: Secondary | ICD-10-CM | POA: Diagnosis not present

## 2019-09-18 DIAGNOSIS — N2581 Secondary hyperparathyroidism of renal origin: Secondary | ICD-10-CM | POA: Diagnosis not present

## 2019-09-18 DIAGNOSIS — D631 Anemia in chronic kidney disease: Secondary | ICD-10-CM | POA: Diagnosis not present

## 2019-09-18 DIAGNOSIS — R519 Headache, unspecified: Secondary | ICD-10-CM | POA: Diagnosis not present

## 2019-09-18 DIAGNOSIS — D688 Other specified coagulation defects: Secondary | ICD-10-CM | POA: Diagnosis not present

## 2019-09-18 NOTE — Progress Notes (Signed)
Cardiology Office Note   Date:  09/18/2019   ID:  [name], DOB [birth year], MRN [id number]  PCP:  Benito Mccreedy, MD  Cardiologist:   Dorris Carnes, MD   F/U of valvular heart disease   History of Present Illness: [name] is a 59 y.o. female with a history of valvular heart dz with aortic stenosis and mitral stenosis. I last saw her back in March of last year. She had heart failure symptoms at the time she underwent cardiac catheterization that showed no significant CAD. Moderate pulmonary hypertension. Severe mitral stenosis. Moderate aortic stenosis. In July she underwent MVR and AVR (Sorin CarboMedics mechanical valve point, 21 mm, in the aortic position; Sorin [employer]ics 31 mm valve in the mitral position   postop. Complicated by bilateral pleural effusions (s/p L thoracentes)   The patient also has a hx of CVA, HTN, ESRD, Lupus, PUD and anemia SHe was last seen in clinic in Oct 2020 by [name]   She continues to follow in coumadin clinic.     Since seen she is doing okay. His chest is still sore in the upper sternum. Hurts when she coughs. Breathing is okay. She goes through dialysis Monday Wednesday Friday. Tolerating the procedures okay. Initially her heart rate was high doing better on a beta-blocker.  She denies dizziness. Feels tired/washed out after dialysis session but is fine the next day       Current Meds  Medication Sig  . amLODipine (NORVASC) 10 MG tablet Take 1 tablet (10 mg total) by mouth at bedtime.  [name] amoxicillin (AMOXIL) 500 MG capsule Take 4 capsules (2,000 mg total) by mouth once as needed (1 hour prior to any dental appointment/procedure including cleaning).  [name] aspirin EC 81 MG tablet Take 81 mg by mouth at bedtime.  . heparin 1000 UNIT/ML injection Heparin Sodium (Porcine) 1,000 Units/mL Systemic  . multivitamin (RENA-VIT) TABS tablet Take 1 tablet by mouth at bedtime.   . ondansetron (ZOFRAN) 4 MG tablet Take 4 mg by mouth every 6 (six) hours  as needed for nausea or vomiting.  . pantoprazole (PROTONIX) 40 MG tablet Take 40 mg by mouth 2 (two) times daily.   . temazepam (RESTORIL) 15 MG capsule Take 15 mg by mouth at bedtime.  [name] warfarin (COUMADIN) 3 MG tablet TAKE 3 TABLETS BY MOUTH DAILY OR AS DIRECTED BY COUMADIN CLINIC     Allergies:   Patient has no known allergies.   Past Medical History:  Diagnosis Date  . Anemia   . Arthritis    "joints" (10/23/2017)  . Blood transfusion '08   [name]; "low HgB" (10/23/2017)  . Chronic diastolic CHF (congestive heart failure) ([place])   . Claustrophobia   . Dysfunctional uterine bleeding 12/19/2010  . ESRD (end stage renal disease) on dialysis [name])    "MWF; [name]." (10/23/2017)  . GERD (gastroesophageal reflux disease)   . Headache   . Hemodialysis patient [facility])    right extremity port  . History of hiatal hernia   . Hx of cardiovascular stress test    Lexiscan Myoview 4/16:  Normal stress nuclear study, EF 59%  . Hypertension   . Lupus ([place])    "? kind" (10/23/2017)  . Mitral stenosis    Echo 4/16:  EF 55-60%, no RWMA, Gr 1 DD, mod MS (mean 9 mmHg), mod LAE, mild RAE, PASP 65, mod to severe TR, trivial eff // Echo 6/19:  Mild LVH, EF 55-60, no RWMA, Gr 2 DD, mild to mod  AS (Mean 20), severe MS (mean 17), massive LAE, PASP 48, trivial effusion   . Peptic ulcer disease   . Pneumonia 10/21/2017  . S/P aortic valve replacement with metallic valve 3/33/8329   21 mm Sorin [employer]omedics bileaflet mechanical valve  . S/P mitral valve replacement with metallic valve 1/91/6606   31 mm Sorin [employer]omedics Optiform bileaflet mechanical valve  . Stroke [facility])    per patient "they said i had a small stroke but i couldnt even tell"  . Valvular heart disease (Aortic Stenosis and Mitral Stenosis) 09/25/2016   s/p mechanical AVR and MVR 11/2018 // Echo 02/2019: EF 65-70, mild LVH, mild LAE, normally functioning mechanical mitral valve prosthesis with no regurgitation or stenosis, trivial TR, normally  functioning mechanical aortic valve prosthesis without regurgitation or stenosis     Past Surgical History:  Procedure Laterality Date  . AORTIC VALVE REPLACEMENT  12/02/2018   AORTIC VALVE REPLACEMENT (AVR) USING CARBOMEDICS SUPRA-ANNULAR TOP HAT SIZE 21MM (N/A)  . AORTIC VALVE REPLACEMENT N/A 12/02/2018   Procedure: AORTIC VALVE REPLACEMENT (AVR) USING CARBOMEDICS SUPRA-ANNULAR TOP HAT SIZE 21MM;  Surgeon: Rexene Alberts, MD;  Location: [place];  Service: Open Heart Surgery;  Laterality: N/A;  . AV FISTULA PLACEMENT Right 02/25/2018   Procedure: INSERTION OF 50m x 16cm ARTEGRAFT;  Surgeon: CWaynetta Sandy MD;  [place]  Service: Vascular;  Laterality: Right;  . COLONOSCOPY W/ BIOPSIES AND POLYPECTOMY    . DIALYSIS FISTULA CREATION  2007  . ENDOMETRIAL ABLATION    . ESOPHAGOGASTRODUODENOSCOPY N/A 07/31/2014   Procedure: ESOPHAGOGASTRODUODENOSCOPY (EGD);  Surgeon: MClarene Essex MD;  [facility];  Service: Endoscopy;  Laterality: N/A;  . FISTULOGRAM Right 02/25/2018   Procedure: FISTULOGRAM ARM;  Surgeon: CWaynetta Sandy MD;  [place]  Service: Vascular;  Laterality: Right;  . HEMATOMA EVACUATION Right 03/06/2018   Procedure: EVACUATION HEMATOMA RIGHT UPPER ARM;  Surgeon: CWaynetta Sandy MD;  [place]  Service: Vascular;  Laterality: Right;  . INSERTION OF ARTERIOVENOUS (AV) ARTEGRAFT ARM Right 09/26/2017   Procedure: INSERTION OF ARTERIOVENOUS (AV) ARTEGRAFT INTO RIGHT ARM;  Surgeon: CWaynetta Sandy MD;  [place]  Service: Vascular;  Laterality: Right;  . INSERTION OF DIALYSIS CATHETER Right 03/06/2018   Procedure: INSERTION OF DIALYSIS CATHETER, right internal jugular;  Surgeon: CWaynetta Sandy MD;  [place]  Service: Vascular;  Laterality: Right;  . IR THORACENTESIS ASP PLEURAL SPACE W/IMG GUIDE  01/07/2019  . MITRAL VALVE REPLACEMENT N/A 12/02/2018   Procedure: MITRAL VALVE (MV) REPLACEMENT  USING CARBOMEDICS OPTIFORM SIZE 31MM;  Surgeon: ORexene Alberts MD;  [facility]  Service: Open Heart Surgery;  Laterality: N/A;  . REVISON OF ARTERIOVENOUS FISTULA Right 09/26/2017   Procedure: REVISION OF ARTERIOVENOUS FISTULA RIGHT ARM WITH ARTEGRAFT;  Surgeon: CWaynetta Sandy MD;  [place]  Service: Vascular;  Laterality: Right;  . REVISON OF ARTERIOVENOUS FISTULA Right 02/25/2018   Procedure: REVISION OF ARTERIOVENOUS FISTULA;  Surgeon: CWaynetta Sandy MD;  [place] [facility]  Service: Vascular;  Laterality: Right;  . RIGHT/LEFT HEART CATH AND CORONARY ANGIOGRAPHY N/A 07/24/2018   Procedure: RIGHT/LEFT HEART CATH AND CORONARY ANGIOGRAPHY;  Surgeon: MLarey Dresser MD;  [facility];  Service: Cardiovascular;  Laterality: N/A;  . SHOULDER OPEN ROTATOR CUFF REPAIR Right 10/09/2016   Procedure: ROTATOR CUFF REPAIR SHOULDER OPEN partial acrominectomy and extensive synovectomy;  Surgeon: GLatanya [name] MD;  [facility];  Service: Orthopedics;  Laterality: Right;  RNFA  . TEE WITHOUT CARDIOVERSION N/A 01/02/2018   Procedure: TRANSESOPHAGEAL ECHOCARDIOGRAM (TEE) Bubble Study;  Surgeon: Larey Dresser, MD;  Location: [name];  Service: Cardiovascular;  Laterality: N/A;  . TEE WITHOUT CARDIOVERSION N/A 12/02/2018   Procedure: TRANSESOPHAGEAL ECHOCARDIOGRAM (TEE);  Surgeon: Rexene Alberts, MD;  Location: [place];  Service: Open Heart Surgery;  Laterality: N/A;  . TUBAL LIGATION       Social History:  The patient  reports that she quit smoking about 9 months ago. Her smoking use included cigarettes. She started smoking about 22 months ago. She has a 1.00 pack-year smoking history. She has never used smokeless tobacco. She reports current alcohol use of about 1.0 standard drinks of alcohol per week. She reports previous drug use. Drugs: Marijuana and Cocaine.   Family History:  The patient's family history includes Hypertension in her father and  mother; Liver cancer in her maternal grandmother; Lymphoma in her maternal aunt; Renal Disease in her father.    ROS:  Please see the history of present illness. All other systems are reviewed and  Negative to the above problem except as noted.    PHYSICAL EXAM: VS:  BP (!) 144/82   Pulse 96   Ht _0  (1.676 m)   Wt 137 lb 12.8 oz (62.5 kg)   LMP 12/05/2010 (LMP Unknown)   SpO2 99%   BMI 22.24 kg/m   GEN thin 59 year old:  in no acute distress  HEENT: normal  Neck: no JVD,  Cardiac: RRR; crisp valve sounds. No lower extremity edema  Respiratory:  clear to auscultation bilaterally, normal work of breathing GI: soft, nontender, nondistended, + BS  No hepatomegaly  MS: no deformity Moving all extremities   Skin: warm and dry, no rash Neuro:  Strength and sensation are intact Psych: euthymic mood, full affect   EKG:  EKG is not ordered today.  Cardiac TESTING Carotid US 11/27/2018 Bilateral ICA 1-39  R/L Cardiac catheterization 07/24/2018 Conclusion: 1. No significant CAD.  2. Mildly elevated PCWP.  3. Preserved cardiac output.  4. 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  5. Severe mitral stenosis.  6. Moderate aortic stenosis.   LAD irregs LCx irregs RCA irregs RA mean 4 RV 74/3 PA 64/21, mean 42 PCWP mean 19 LV 169/10 AO 158/73  Oxygen saturations: PA 67% AO 99%  Cardiac Output (Fick) 6.15  Cardiac Index (Fick) 3.64 PVR 3.7 WU  Cardiac Output (Thermo) 7.15 Cardiac Index (Thermo) 4.23  PVR 3.2 WU  Mitral valve:  Mean gradient 22 mmHg MVA (Fick) 1.38 cm^2 MVA (Thermo) 1.6 cm^2  Aortic valve:  Mean gradient 10 mmHg, AVA 1.73 cm^2 (simultaneous FA/LV) Mean gradient 24 mmHg, AVA 1.41 cm^2 (pull back)    Echocardiogram 07/03/2018 EF 60-65, mild LVH, pseudonormalization (Gr 2 DD), normal RVSF, mod to severe MAC, mild to mod MR, severe MS (mean 21), mod AS (mean 34), severe LAE, PASP 34, trivial eff  TEE 01/02/18 Mild LVH,  EF 55-60, no RWMA, functionally bicuspid AoV, mod AS (mean 26), mild to mod MR, mod MS (mean 16), mild LAE, mildly reduced RVSF    Lipid Panel    Component Value Date/Time   CHOL 187 02/23/2016 1040   TRIG 89 02/23/2016 1040   HDL 55 02/23/2016 1040   CHOLHDL 3.4 02/23/2016 1040   VLDL 18 02/23/2016 1040   LDLCALC 114 02/23/2016 1040      Wt Readings from Last 3 Encounters:  09/18/19 137 lb 12.8 oz (  62.5 kg)  07/02/19 137 lb 3.2 oz (62.2 kg)  03/24/19 134 lb (60.8 kg)      ASSESSMENT AND PLAN:  1. Valvular heart disease. Patient status post AVR, MVR in July 2020. She is recovering well. Continue to follow closely in Coumadin clinic.  2. Hypertension blood pressure is adequately controlled.  3. End-stage renal disease -Continue dialysis.  I'll set to see the patient back in the fall, sooner for problems. Note she received her Covid vaccine through the [name].   Current medicines are reviewed at length with the patient today.  The patient does not have concerns regarding medicines.  Signed, Dorris Carnes, MD  09/18/2019 8:13 AM    [address], [place], [place]  [address] Phone: [phone]; Fax: [phone]

## 2019-09-18 NOTE — Patient Instructions (Signed)
Medication Instructions:  Your physician recommends that you continue on your current medications as directed. Please refer to the Current Medication list given to you today.  *If you need a refill on your cardiac medications before your next appointment, please call your pharmacy*   Lab Work: None ordered If you have labs (blood work) drawn today and your tests are completely normal, you will receive your results only by: [name] MyChart Message (if you have MyChart) OR . A paper copy in the mail If you have any lab test that is abnormal or we need to change your treatment, we will call you to review the results.   Testing/Procedures: None ordered   Follow-Up: At [facility], you and your health needs are our priority.  As part of our continuing mission to provide you with exceptional heart care, we have created designated Provider Care Teams.  These Care Teams include your primary Cardiologist (physician) and Advanced Practice Providers (APPs -  Physician Assistants and Nurse Practitioners) who all work together to provide you with the care you need, when you need it.  We recommend signing up for the patient portal called "MyChart".  Sign up information is provided on this After Visit Summary.  MyChart is used to connect with patients for Virtual Visits (Telemedicine).  Patients are able to view lab/test results, encounter notes, upcoming appointments, etc.  Non-urgent messages can be sent to your provider as well.   To learn more about what you can do with MyChart, go to [email or web address].    Your next appointment:   5 month(s)  (early September)   The format for your next appointment:   In Person  Provider:   Dorris Carnes, MD   Thank you for choosing [facility]!!     Other Instructions

## 2019-09-21 DIAGNOSIS — N2581 Secondary hyperparathyroidism of renal origin: Secondary | ICD-10-CM | POA: Diagnosis not present

## 2019-09-21 DIAGNOSIS — D509 Iron deficiency anemia, unspecified: Secondary | ICD-10-CM | POA: Diagnosis not present

## 2019-09-21 DIAGNOSIS — N186 End stage renal disease: Secondary | ICD-10-CM | POA: Diagnosis not present

## 2019-09-21 DIAGNOSIS — D688 Other specified coagulation defects: Secondary | ICD-10-CM | POA: Diagnosis not present

## 2019-09-21 DIAGNOSIS — M3210 Systemic lupus erythematosus, organ or system involvement unspecified: Secondary | ICD-10-CM | POA: Diagnosis not present

## 2019-09-21 DIAGNOSIS — Z992 Dependence on renal dialysis: Secondary | ICD-10-CM | POA: Diagnosis not present

## 2019-09-21 DIAGNOSIS — D631 Anemia in chronic kidney disease: Secondary | ICD-10-CM | POA: Diagnosis not present

## 2019-09-22 IMAGING — DX DG CHEST 2V
2 series · 2 of 2 positions shown · non-contrast
Comparison: 01/09/2019

CLINICAL DATA: Pleural effusion, recent mitral valve surgery

EXAM:
CHEST - 2 VIEW

[dg chest 2 view (1 of 2)]
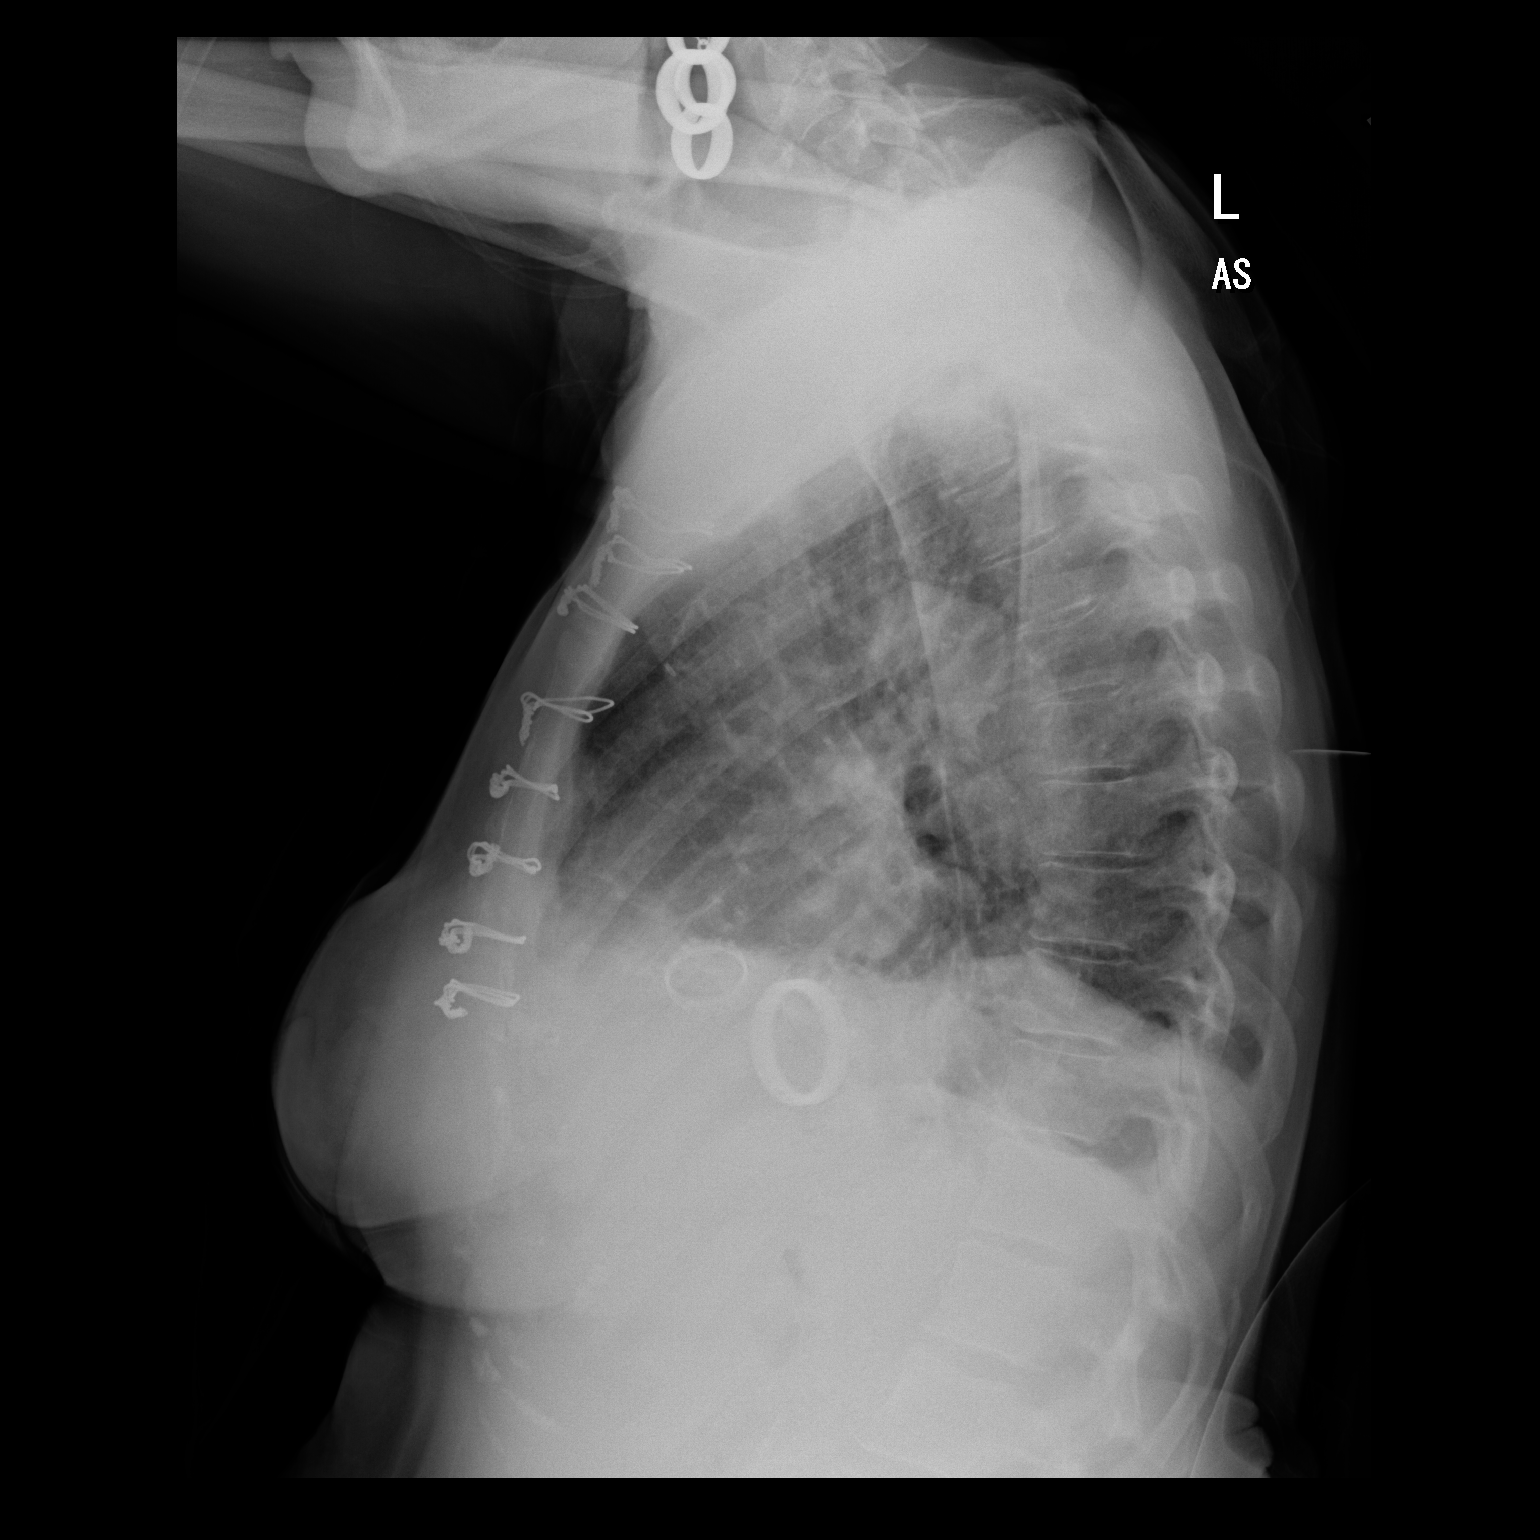

[dg chest 2 view (2 of 2)]
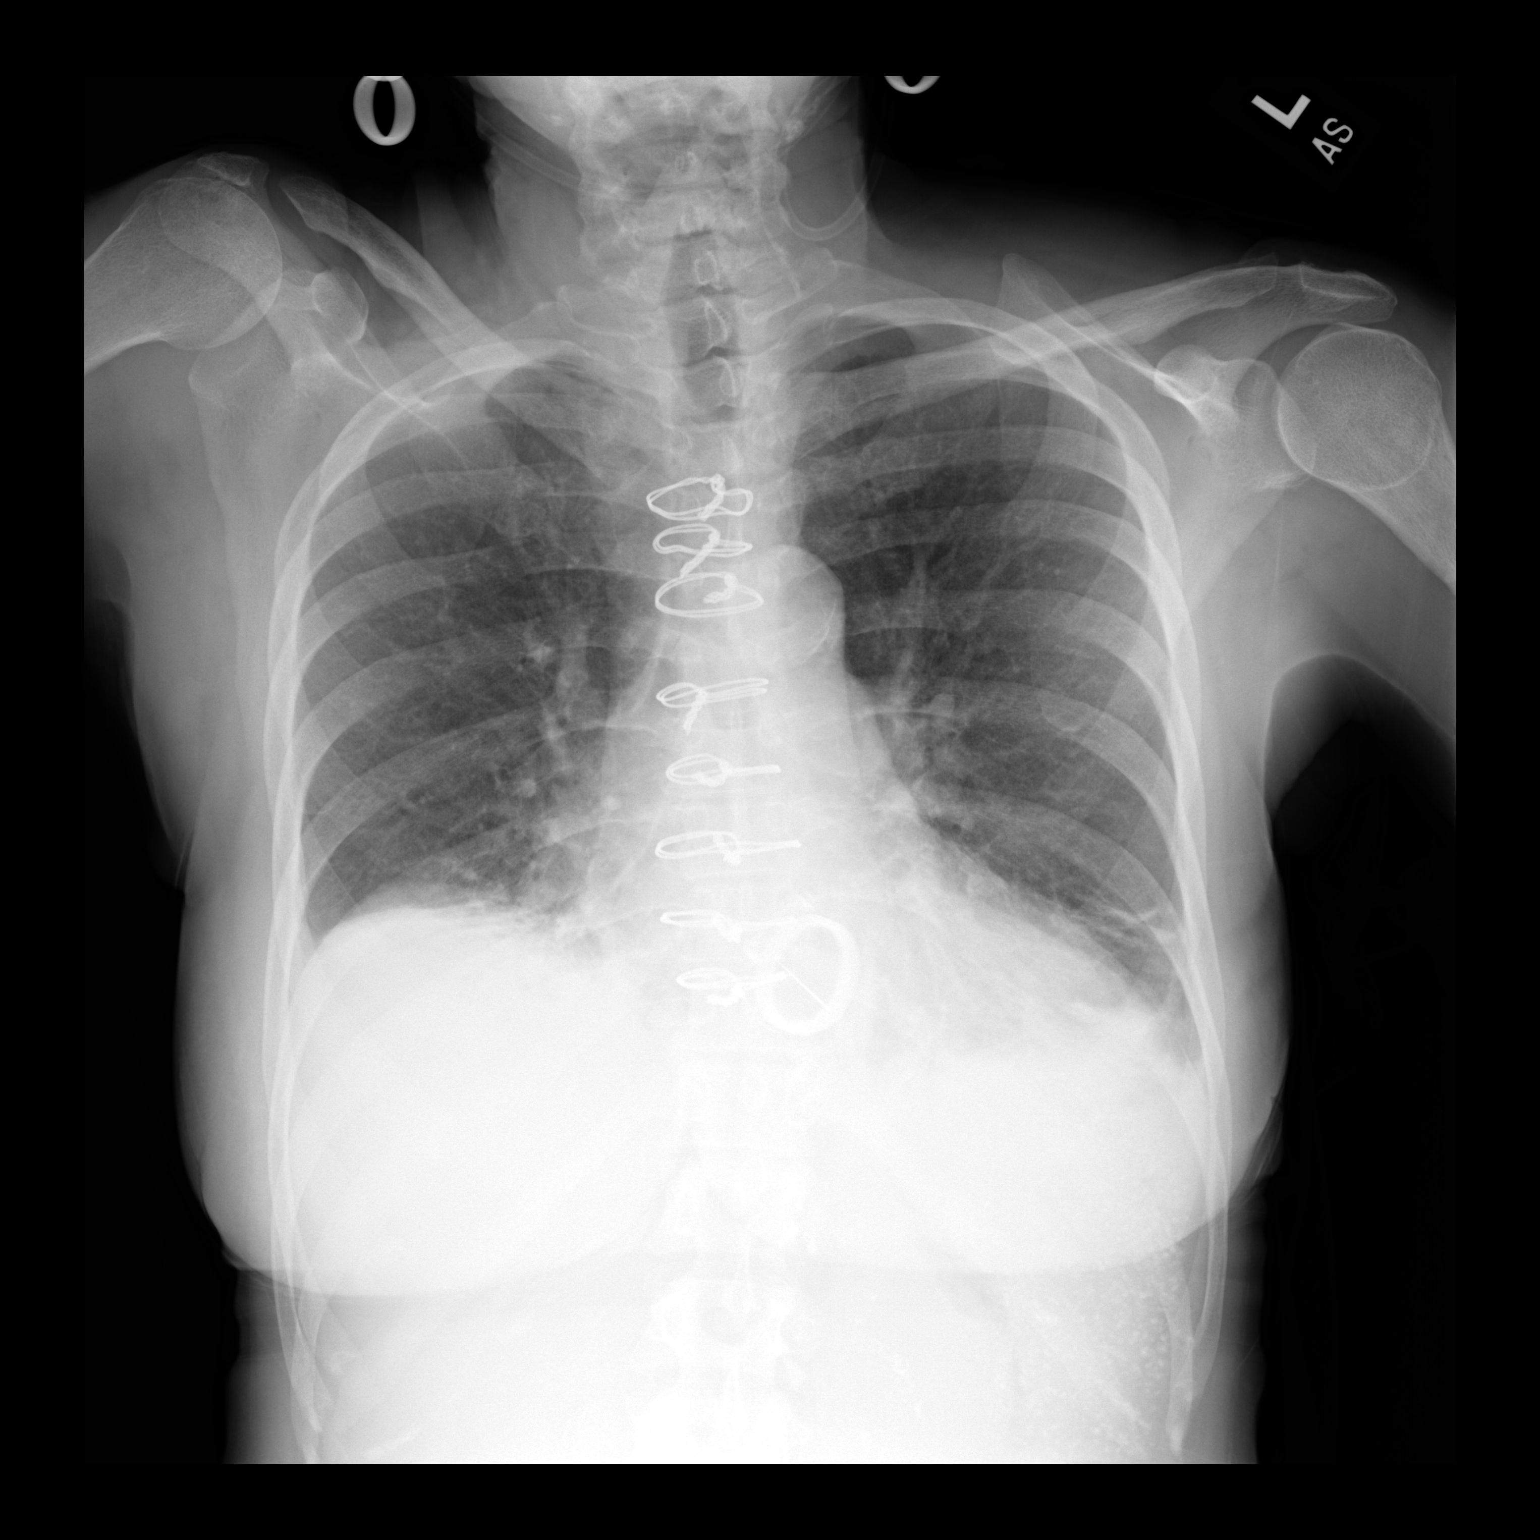

[2 of 2 positions shown; findings below may reference images not displayed]

FINDINGS: Slight interval improvement in aeration of the lungs with small
persistent bilateral pleural effusions and associated bibasilar
atelectasis. No new or focal airspace opacity. Cardiomegaly status
post median sternotomy with mitral valve prosthesis.
IMPRESSION: Slight interval improvement in aeration of the lungs with small
persistent bilateral pleural effusions and associated bibasilar
atelectasis. No new or focal airspace opacity.

## 2019-09-23 DIAGNOSIS — D509 Iron deficiency anemia, unspecified: Secondary | ICD-10-CM | POA: Diagnosis not present

## 2019-09-23 DIAGNOSIS — Z992 Dependence on renal dialysis: Secondary | ICD-10-CM | POA: Diagnosis not present

## 2019-09-23 DIAGNOSIS — M3210 Systemic lupus erythematosus, organ or system involvement unspecified: Secondary | ICD-10-CM | POA: Diagnosis not present

## 2019-09-23 DIAGNOSIS — N2581 Secondary hyperparathyroidism of renal origin: Secondary | ICD-10-CM | POA: Diagnosis not present

## 2019-09-23 DIAGNOSIS — D631 Anemia in chronic kidney disease: Secondary | ICD-10-CM | POA: Diagnosis not present

## 2019-09-23 DIAGNOSIS — D688 Other specified coagulation defects: Secondary | ICD-10-CM | POA: Diagnosis not present

## 2019-09-23 DIAGNOSIS — N186 End stage renal disease: Secondary | ICD-10-CM | POA: Diagnosis not present

## 2019-09-25 DIAGNOSIS — D509 Iron deficiency anemia, unspecified: Secondary | ICD-10-CM | POA: Diagnosis not present

## 2019-09-25 DIAGNOSIS — Z992 Dependence on renal dialysis: Secondary | ICD-10-CM | POA: Diagnosis not present

## 2019-09-25 DIAGNOSIS — N2581 Secondary hyperparathyroidism of renal origin: Secondary | ICD-10-CM | POA: Diagnosis not present

## 2019-09-25 DIAGNOSIS — M3210 Systemic lupus erythematosus, organ or system involvement unspecified: Secondary | ICD-10-CM | POA: Diagnosis not present

## 2019-09-25 DIAGNOSIS — D631 Anemia in chronic kidney disease: Secondary | ICD-10-CM | POA: Diagnosis not present

## 2019-09-25 DIAGNOSIS — N186 End stage renal disease: Secondary | ICD-10-CM | POA: Diagnosis not present

## 2019-09-25 DIAGNOSIS — D688 Other specified coagulation defects: Secondary | ICD-10-CM | POA: Diagnosis not present

## 2019-09-28 DIAGNOSIS — D509 Iron deficiency anemia, unspecified: Secondary | ICD-10-CM | POA: Diagnosis not present

## 2019-09-28 DIAGNOSIS — D631 Anemia in chronic kidney disease: Secondary | ICD-10-CM | POA: Diagnosis not present

## 2019-09-28 DIAGNOSIS — Z992 Dependence on renal dialysis: Secondary | ICD-10-CM | POA: Diagnosis not present

## 2019-09-28 DIAGNOSIS — N2581 Secondary hyperparathyroidism of renal origin: Secondary | ICD-10-CM | POA: Diagnosis not present

## 2019-09-28 DIAGNOSIS — M3210 Systemic lupus erythematosus, organ or system involvement unspecified: Secondary | ICD-10-CM | POA: Diagnosis not present

## 2019-09-28 DIAGNOSIS — N186 End stage renal disease: Secondary | ICD-10-CM | POA: Diagnosis not present

## 2019-09-28 DIAGNOSIS — D688 Other specified coagulation defects: Secondary | ICD-10-CM | POA: Diagnosis not present

## 2019-10-01 ENCOUNTER — Other Ambulatory Visit: Payer: Self-pay

## 2019-10-01 ENCOUNTER — Ambulatory Visit
Admission: RE | Admit: 2019-10-01 | Discharge: 2019-10-01 | Disposition: A | Discharge status: Home / Self Care | Payer: Medicare HMO | Source: Ambulatory Visit | Attending: Obstetrics | Admitting: Obstetrics

## 2019-10-01 ENCOUNTER — Other Ambulatory Visit: Payer: Self-pay | Admitting: Obstetrics

## 2019-10-01 DIAGNOSIS — N6325 Unspecified lump in the left breast, overlapping quadrants: Secondary | ICD-10-CM

## 2019-10-01 DIAGNOSIS — R921 Mammographic calcification found on diagnostic imaging of breast: Secondary | ICD-10-CM | POA: Diagnosis not present

## 2019-10-01 DIAGNOSIS — N6322 Unspecified lump in the left breast, upper inner quadrant: Secondary | ICD-10-CM | POA: Diagnosis not present

## 2019-10-01 DIAGNOSIS — N6320 Unspecified lump in the left breast, unspecified quadrant: Secondary | ICD-10-CM

## 2019-10-01 DIAGNOSIS — R599 Enlarged lymph nodes, unspecified: Secondary | ICD-10-CM

## 2019-10-02 DIAGNOSIS — N186 End stage renal disease: Secondary | ICD-10-CM | POA: Diagnosis not present

## 2019-10-02 DIAGNOSIS — D688 Other specified coagulation defects: Secondary | ICD-10-CM | POA: Diagnosis not present

## 2019-10-02 DIAGNOSIS — D509 Iron deficiency anemia, unspecified: Secondary | ICD-10-CM | POA: Diagnosis not present

## 2019-10-02 DIAGNOSIS — M3210 Systemic lupus erythematosus, organ or system involvement unspecified: Secondary | ICD-10-CM | POA: Diagnosis not present

## 2019-10-02 DIAGNOSIS — Z992 Dependence on renal dialysis: Secondary | ICD-10-CM | POA: Diagnosis not present

## 2019-10-02 DIAGNOSIS — N2581 Secondary hyperparathyroidism of renal origin: Secondary | ICD-10-CM | POA: Diagnosis not present

## 2019-10-02 DIAGNOSIS — D631 Anemia in chronic kidney disease: Secondary | ICD-10-CM | POA: Diagnosis not present

## 2019-10-05 DIAGNOSIS — N2581 Secondary hyperparathyroidism of renal origin: Secondary | ICD-10-CM | POA: Diagnosis not present

## 2019-10-05 DIAGNOSIS — D688 Other specified coagulation defects: Secondary | ICD-10-CM | POA: Diagnosis not present

## 2019-10-05 DIAGNOSIS — D631 Anemia in chronic kidney disease: Secondary | ICD-10-CM | POA: Diagnosis not present

## 2019-10-05 DIAGNOSIS — D509 Iron deficiency anemia, unspecified: Secondary | ICD-10-CM | POA: Diagnosis not present

## 2019-10-05 DIAGNOSIS — M3210 Systemic lupus erythematosus, organ or system involvement unspecified: Secondary | ICD-10-CM | POA: Diagnosis not present

## 2019-10-05 DIAGNOSIS — Z992 Dependence on renal dialysis: Secondary | ICD-10-CM | POA: Diagnosis not present

## 2019-10-05 DIAGNOSIS — N186 End stage renal disease: Secondary | ICD-10-CM | POA: Diagnosis not present

## 2019-10-06 ENCOUNTER — Other Ambulatory Visit: Payer: Self-pay

## 2019-10-06 ENCOUNTER — Ambulatory Visit (INDEPENDENT_AMBULATORY_CARE_PROVIDER_SITE_OTHER): Payer: Medicare HMO | Admitting: *Deleted

## 2019-10-06 DIAGNOSIS — Z954 Presence of other heart-valve replacement: Secondary | ICD-10-CM | POA: Diagnosis not present

## 2019-10-06 DIAGNOSIS — I350 Nonrheumatic aortic (valve) stenosis: Secondary | ICD-10-CM | POA: Diagnosis not present

## 2019-10-06 DIAGNOSIS — I050 Rheumatic mitral stenosis: Secondary | ICD-10-CM | POA: Diagnosis not present

## 2019-10-06 DIAGNOSIS — Z5181 Encounter for therapeutic drug level monitoring: Secondary | ICD-10-CM | POA: Diagnosis not present

## 2019-10-06 DIAGNOSIS — J9811 Atelectasis: Secondary | ICD-10-CM | POA: Diagnosis not present

## 2019-10-06 LAB — POCT INR: INR: 2.4 (ref 2.0–3.0)

## 2019-10-06 NOTE — Patient Instructions (Signed)
Description   Today take 3.5 tablets then continue same dosage 3 tablets daily except 2 tablets on Mondays and Fridays. Recheck INR in 3 weeks. Call Coumadin clinic with any problems [phone].

## 2019-10-07 DIAGNOSIS — D688 Other specified coagulation defects: Secondary | ICD-10-CM | POA: Diagnosis not present

## 2019-10-07 DIAGNOSIS — N186 End stage renal disease: Secondary | ICD-10-CM | POA: Diagnosis not present

## 2019-10-07 DIAGNOSIS — Z992 Dependence on renal dialysis: Secondary | ICD-10-CM | POA: Diagnosis not present

## 2019-10-07 DIAGNOSIS — N2581 Secondary hyperparathyroidism of renal origin: Secondary | ICD-10-CM | POA: Diagnosis not present

## 2019-10-07 DIAGNOSIS — D509 Iron deficiency anemia, unspecified: Secondary | ICD-10-CM | POA: Diagnosis not present

## 2019-10-07 DIAGNOSIS — D631 Anemia in chronic kidney disease: Secondary | ICD-10-CM | POA: Diagnosis not present

## 2019-10-07 DIAGNOSIS — M3210 Systemic lupus erythematosus, organ or system involvement unspecified: Secondary | ICD-10-CM | POA: Diagnosis not present

## 2019-10-09 DIAGNOSIS — D688 Other specified coagulation defects: Secondary | ICD-10-CM | POA: Diagnosis not present

## 2019-10-09 DIAGNOSIS — N186 End stage renal disease: Secondary | ICD-10-CM | POA: Diagnosis not present

## 2019-10-09 DIAGNOSIS — Z992 Dependence on renal dialysis: Secondary | ICD-10-CM | POA: Diagnosis not present

## 2019-10-09 DIAGNOSIS — N2581 Secondary hyperparathyroidism of renal origin: Secondary | ICD-10-CM | POA: Diagnosis not present

## 2019-10-09 DIAGNOSIS — D631 Anemia in chronic kidney disease: Secondary | ICD-10-CM | POA: Diagnosis not present

## 2019-10-09 DIAGNOSIS — D509 Iron deficiency anemia, unspecified: Secondary | ICD-10-CM | POA: Diagnosis not present

## 2019-10-09 DIAGNOSIS — M3210 Systemic lupus erythematosus, organ or system involvement unspecified: Secondary | ICD-10-CM | POA: Diagnosis not present

## 2019-10-12 DIAGNOSIS — M3210 Systemic lupus erythematosus, organ or system involvement unspecified: Secondary | ICD-10-CM | POA: Diagnosis not present

## 2019-10-12 DIAGNOSIS — D631 Anemia in chronic kidney disease: Secondary | ICD-10-CM | POA: Diagnosis not present

## 2019-10-12 DIAGNOSIS — N2581 Secondary hyperparathyroidism of renal origin: Secondary | ICD-10-CM | POA: Diagnosis not present

## 2019-10-12 DIAGNOSIS — D688 Other specified coagulation defects: Secondary | ICD-10-CM | POA: Diagnosis not present

## 2019-10-12 DIAGNOSIS — N186 End stage renal disease: Secondary | ICD-10-CM | POA: Diagnosis not present

## 2019-10-12 DIAGNOSIS — D509 Iron deficiency anemia, unspecified: Secondary | ICD-10-CM | POA: Diagnosis not present

## 2019-10-12 DIAGNOSIS — Z992 Dependence on renal dialysis: Secondary | ICD-10-CM | POA: Diagnosis not present

## 2019-10-14 DIAGNOSIS — D688 Other specified coagulation defects: Secondary | ICD-10-CM | POA: Diagnosis not present

## 2019-10-14 DIAGNOSIS — M3210 Systemic lupus erythematosus, organ or system involvement unspecified: Secondary | ICD-10-CM | POA: Diagnosis not present

## 2019-10-14 DIAGNOSIS — D509 Iron deficiency anemia, unspecified: Secondary | ICD-10-CM | POA: Diagnosis not present

## 2019-10-14 DIAGNOSIS — Z992 Dependence on renal dialysis: Secondary | ICD-10-CM | POA: Diagnosis not present

## 2019-10-14 DIAGNOSIS — N186 End stage renal disease: Secondary | ICD-10-CM | POA: Diagnosis not present

## 2019-10-14 DIAGNOSIS — D631 Anemia in chronic kidney disease: Secondary | ICD-10-CM | POA: Diagnosis not present

## 2019-10-14 DIAGNOSIS — N2581 Secondary hyperparathyroidism of renal origin: Secondary | ICD-10-CM | POA: Diagnosis not present

## 2019-10-16 DIAGNOSIS — N2581 Secondary hyperparathyroidism of renal origin: Secondary | ICD-10-CM | POA: Diagnosis not present

## 2019-10-16 DIAGNOSIS — N186 End stage renal disease: Secondary | ICD-10-CM | POA: Diagnosis not present

## 2019-10-16 DIAGNOSIS — D509 Iron deficiency anemia, unspecified: Secondary | ICD-10-CM | POA: Diagnosis not present

## 2019-10-16 DIAGNOSIS — Z992 Dependence on renal dialysis: Secondary | ICD-10-CM | POA: Diagnosis not present

## 2019-10-16 DIAGNOSIS — M3210 Systemic lupus erythematosus, organ or system involvement unspecified: Secondary | ICD-10-CM | POA: Diagnosis not present

## 2019-10-16 DIAGNOSIS — D688 Other specified coagulation defects: Secondary | ICD-10-CM | POA: Diagnosis not present

## 2019-10-16 DIAGNOSIS — D631 Anemia in chronic kidney disease: Secondary | ICD-10-CM | POA: Diagnosis not present

## 2019-10-19 DIAGNOSIS — D631 Anemia in chronic kidney disease: Secondary | ICD-10-CM | POA: Diagnosis not present

## 2019-10-19 DIAGNOSIS — N2581 Secondary hyperparathyroidism of renal origin: Secondary | ICD-10-CM | POA: Diagnosis not present

## 2019-10-19 DIAGNOSIS — N186 End stage renal disease: Secondary | ICD-10-CM | POA: Diagnosis not present

## 2019-10-19 DIAGNOSIS — M3210 Systemic lupus erythematosus, organ or system involvement unspecified: Secondary | ICD-10-CM | POA: Diagnosis not present

## 2019-10-19 DIAGNOSIS — I120 Hypertensive chronic kidney disease with stage 5 chronic kidney disease or end stage renal disease: Secondary | ICD-10-CM | POA: Diagnosis not present

## 2019-10-19 DIAGNOSIS — D688 Other specified coagulation defects: Secondary | ICD-10-CM | POA: Diagnosis not present

## 2019-10-19 DIAGNOSIS — D509 Iron deficiency anemia, unspecified: Secondary | ICD-10-CM | POA: Diagnosis not present

## 2019-10-19 DIAGNOSIS — Z992 Dependence on renal dialysis: Secondary | ICD-10-CM | POA: Diagnosis not present

## 2019-10-21 DIAGNOSIS — D688 Other specified coagulation defects: Secondary | ICD-10-CM | POA: Diagnosis not present

## 2019-10-21 DIAGNOSIS — D509 Iron deficiency anemia, unspecified: Secondary | ICD-10-CM | POA: Diagnosis not present

## 2019-10-21 DIAGNOSIS — N2581 Secondary hyperparathyroidism of renal origin: Secondary | ICD-10-CM | POA: Diagnosis not present

## 2019-10-21 DIAGNOSIS — M3210 Systemic lupus erythematosus, organ or system involvement unspecified: Secondary | ICD-10-CM | POA: Diagnosis not present

## 2019-10-21 DIAGNOSIS — N186 End stage renal disease: Secondary | ICD-10-CM | POA: Diagnosis not present

## 2019-10-21 DIAGNOSIS — Z992 Dependence on renal dialysis: Secondary | ICD-10-CM | POA: Diagnosis not present

## 2019-10-23 DIAGNOSIS — N2581 Secondary hyperparathyroidism of renal origin: Secondary | ICD-10-CM | POA: Diagnosis not present

## 2019-10-23 DIAGNOSIS — D688 Other specified coagulation defects: Secondary | ICD-10-CM | POA: Diagnosis not present

## 2019-10-23 DIAGNOSIS — M3210 Systemic lupus erythematosus, organ or system involvement unspecified: Secondary | ICD-10-CM | POA: Diagnosis not present

## 2019-10-23 DIAGNOSIS — Z992 Dependence on renal dialysis: Secondary | ICD-10-CM | POA: Diagnosis not present

## 2019-10-23 DIAGNOSIS — D509 Iron deficiency anemia, unspecified: Secondary | ICD-10-CM | POA: Diagnosis not present

## 2019-10-23 DIAGNOSIS — N186 End stage renal disease: Secondary | ICD-10-CM | POA: Diagnosis not present

## 2019-10-26 DIAGNOSIS — D509 Iron deficiency anemia, unspecified: Secondary | ICD-10-CM | POA: Diagnosis not present

## 2019-10-26 DIAGNOSIS — N2581 Secondary hyperparathyroidism of renal origin: Secondary | ICD-10-CM | POA: Diagnosis not present

## 2019-10-26 DIAGNOSIS — D688 Other specified coagulation defects: Secondary | ICD-10-CM | POA: Diagnosis not present

## 2019-10-26 DIAGNOSIS — Z992 Dependence on renal dialysis: Secondary | ICD-10-CM | POA: Diagnosis not present

## 2019-10-26 DIAGNOSIS — M3210 Systemic lupus erythematosus, organ or system involvement unspecified: Secondary | ICD-10-CM | POA: Diagnosis not present

## 2019-10-26 DIAGNOSIS — N186 End stage renal disease: Secondary | ICD-10-CM | POA: Diagnosis not present

## 2019-10-27 ENCOUNTER — Other Ambulatory Visit: Payer: Self-pay

## 2019-10-27 ENCOUNTER — Ambulatory Visit (INDEPENDENT_AMBULATORY_CARE_PROVIDER_SITE_OTHER): Payer: Medicare HMO

## 2019-10-27 DIAGNOSIS — Z5181 Encounter for therapeutic drug level monitoring: Secondary | ICD-10-CM

## 2019-10-27 DIAGNOSIS — Z954 Presence of other heart-valve replacement: Secondary | ICD-10-CM | POA: Diagnosis not present

## 2019-10-27 LAB — POCT INR: INR: 2.5 (ref 2.0–3.0)

## 2019-10-27 NOTE — Patient Instructions (Signed)
Description   Continue same dosage 3 tablets daily except 2 tablets on Mondays and Fridays. Recheck INR in 4 weeks. Call Coumadin clinic with any problems [phone].

## 2019-10-28 DIAGNOSIS — D509 Iron deficiency anemia, unspecified: Secondary | ICD-10-CM | POA: Diagnosis not present

## 2019-10-28 DIAGNOSIS — N2581 Secondary hyperparathyroidism of renal origin: Secondary | ICD-10-CM | POA: Diagnosis not present

## 2019-10-28 DIAGNOSIS — Z992 Dependence on renal dialysis: Secondary | ICD-10-CM | POA: Diagnosis not present

## 2019-10-28 DIAGNOSIS — D688 Other specified coagulation defects: Secondary | ICD-10-CM | POA: Diagnosis not present

## 2019-10-28 DIAGNOSIS — N186 End stage renal disease: Secondary | ICD-10-CM | POA: Diagnosis not present

## 2019-10-28 DIAGNOSIS — M3210 Systemic lupus erythematosus, organ or system involvement unspecified: Secondary | ICD-10-CM | POA: Diagnosis not present

## 2019-10-30 DIAGNOSIS — D509 Iron deficiency anemia, unspecified: Secondary | ICD-10-CM | POA: Diagnosis not present

## 2019-10-30 DIAGNOSIS — D688 Other specified coagulation defects: Secondary | ICD-10-CM | POA: Diagnosis not present

## 2019-10-30 DIAGNOSIS — N186 End stage renal disease: Secondary | ICD-10-CM | POA: Diagnosis not present

## 2019-10-30 DIAGNOSIS — M3210 Systemic lupus erythematosus, organ or system involvement unspecified: Secondary | ICD-10-CM | POA: Diagnosis not present

## 2019-10-30 DIAGNOSIS — Z992 Dependence on renal dialysis: Secondary | ICD-10-CM | POA: Diagnosis not present

## 2019-10-30 DIAGNOSIS — N2581 Secondary hyperparathyroidism of renal origin: Secondary | ICD-10-CM | POA: Diagnosis not present

## 2019-11-02 DIAGNOSIS — D688 Other specified coagulation defects: Secondary | ICD-10-CM | POA: Diagnosis not present

## 2019-11-02 DIAGNOSIS — D509 Iron deficiency anemia, unspecified: Secondary | ICD-10-CM | POA: Diagnosis not present

## 2019-11-02 DIAGNOSIS — N186 End stage renal disease: Secondary | ICD-10-CM | POA: Diagnosis not present

## 2019-11-02 DIAGNOSIS — Z992 Dependence on renal dialysis: Secondary | ICD-10-CM | POA: Diagnosis not present

## 2019-11-02 DIAGNOSIS — N2581 Secondary hyperparathyroidism of renal origin: Secondary | ICD-10-CM | POA: Diagnosis not present

## 2019-11-02 DIAGNOSIS — M3210 Systemic lupus erythematosus, organ or system involvement unspecified: Secondary | ICD-10-CM | POA: Diagnosis not present

## 2019-11-04 DIAGNOSIS — Z992 Dependence on renal dialysis: Secondary | ICD-10-CM | POA: Diagnosis not present

## 2019-11-04 DIAGNOSIS — D688 Other specified coagulation defects: Secondary | ICD-10-CM | POA: Diagnosis not present

## 2019-11-04 DIAGNOSIS — M3210 Systemic lupus erythematosus, organ or system involvement unspecified: Secondary | ICD-10-CM | POA: Diagnosis not present

## 2019-11-04 DIAGNOSIS — N186 End stage renal disease: Secondary | ICD-10-CM | POA: Diagnosis not present

## 2019-11-04 DIAGNOSIS — D509 Iron deficiency anemia, unspecified: Secondary | ICD-10-CM | POA: Diagnosis not present

## 2019-11-04 DIAGNOSIS — N2581 Secondary hyperparathyroidism of renal origin: Secondary | ICD-10-CM | POA: Diagnosis not present

## 2019-11-06 DIAGNOSIS — I050 Rheumatic mitral stenosis: Secondary | ICD-10-CM | POA: Diagnosis not present

## 2019-11-06 DIAGNOSIS — Z992 Dependence on renal dialysis: Secondary | ICD-10-CM | POA: Diagnosis not present

## 2019-11-06 DIAGNOSIS — J9811 Atelectasis: Secondary | ICD-10-CM | POA: Diagnosis not present

## 2019-11-06 DIAGNOSIS — D509 Iron deficiency anemia, unspecified: Secondary | ICD-10-CM | POA: Diagnosis not present

## 2019-11-06 DIAGNOSIS — N186 End stage renal disease: Secondary | ICD-10-CM | POA: Diagnosis not present

## 2019-11-06 DIAGNOSIS — M3210 Systemic lupus erythematosus, organ or system involvement unspecified: Secondary | ICD-10-CM | POA: Diagnosis not present

## 2019-11-06 DIAGNOSIS — I350 Nonrheumatic aortic (valve) stenosis: Secondary | ICD-10-CM | POA: Diagnosis not present

## 2019-11-06 DIAGNOSIS — D688 Other specified coagulation defects: Secondary | ICD-10-CM | POA: Diagnosis not present

## 2019-11-06 DIAGNOSIS — N2581 Secondary hyperparathyroidism of renal origin: Secondary | ICD-10-CM | POA: Diagnosis not present

## 2019-11-09 DIAGNOSIS — M3210 Systemic lupus erythematosus, organ or system involvement unspecified: Secondary | ICD-10-CM | POA: Diagnosis not present

## 2019-11-09 DIAGNOSIS — N2581 Secondary hyperparathyroidism of renal origin: Secondary | ICD-10-CM | POA: Diagnosis not present

## 2019-11-09 DIAGNOSIS — Z992 Dependence on renal dialysis: Secondary | ICD-10-CM | POA: Diagnosis not present

## 2019-11-09 DIAGNOSIS — N186 End stage renal disease: Secondary | ICD-10-CM | POA: Diagnosis not present

## 2019-11-09 DIAGNOSIS — D509 Iron deficiency anemia, unspecified: Secondary | ICD-10-CM | POA: Diagnosis not present

## 2019-11-09 DIAGNOSIS — D688 Other specified coagulation defects: Secondary | ICD-10-CM | POA: Diagnosis not present

## 2019-11-11 DIAGNOSIS — Z992 Dependence on renal dialysis: Secondary | ICD-10-CM | POA: Diagnosis not present

## 2019-11-11 DIAGNOSIS — M3210 Systemic lupus erythematosus, organ or system involvement unspecified: Secondary | ICD-10-CM | POA: Diagnosis not present

## 2019-11-11 DIAGNOSIS — D688 Other specified coagulation defects: Secondary | ICD-10-CM | POA: Diagnosis not present

## 2019-11-11 DIAGNOSIS — N186 End stage renal disease: Secondary | ICD-10-CM | POA: Diagnosis not present

## 2019-11-11 DIAGNOSIS — D509 Iron deficiency anemia, unspecified: Secondary | ICD-10-CM | POA: Diagnosis not present

## 2019-11-11 DIAGNOSIS — N2581 Secondary hyperparathyroidism of renal origin: Secondary | ICD-10-CM | POA: Diagnosis not present

## 2019-11-13 DIAGNOSIS — D688 Other specified coagulation defects: Secondary | ICD-10-CM | POA: Diagnosis not present

## 2019-11-13 DIAGNOSIS — M3210 Systemic lupus erythematosus, organ or system involvement unspecified: Secondary | ICD-10-CM | POA: Diagnosis not present

## 2019-11-13 DIAGNOSIS — D509 Iron deficiency anemia, unspecified: Secondary | ICD-10-CM | POA: Diagnosis not present

## 2019-11-13 DIAGNOSIS — N186 End stage renal disease: Secondary | ICD-10-CM | POA: Diagnosis not present

## 2019-11-13 DIAGNOSIS — Z992 Dependence on renal dialysis: Secondary | ICD-10-CM | POA: Diagnosis not present

## 2019-11-13 DIAGNOSIS — N2581 Secondary hyperparathyroidism of renal origin: Secondary | ICD-10-CM | POA: Diagnosis not present

## 2019-11-16 DIAGNOSIS — M3210 Systemic lupus erythematosus, organ or system involvement unspecified: Secondary | ICD-10-CM | POA: Diagnosis not present

## 2019-11-16 DIAGNOSIS — D688 Other specified coagulation defects: Secondary | ICD-10-CM | POA: Diagnosis not present

## 2019-11-16 DIAGNOSIS — N2581 Secondary hyperparathyroidism of renal origin: Secondary | ICD-10-CM | POA: Diagnosis not present

## 2019-11-16 DIAGNOSIS — Z992 Dependence on renal dialysis: Secondary | ICD-10-CM | POA: Diagnosis not present

## 2019-11-16 DIAGNOSIS — N186 End stage renal disease: Secondary | ICD-10-CM | POA: Diagnosis not present

## 2019-11-16 DIAGNOSIS — D509 Iron deficiency anemia, unspecified: Secondary | ICD-10-CM | POA: Diagnosis not present

## 2019-11-17 ENCOUNTER — Other Ambulatory Visit: Payer: Self-pay | Admitting: Internal Medicine

## 2019-11-18 DIAGNOSIS — M3210 Systemic lupus erythematosus, organ or system involvement unspecified: Secondary | ICD-10-CM | POA: Diagnosis not present

## 2019-11-18 DIAGNOSIS — I120 Hypertensive chronic kidney disease with stage 5 chronic kidney disease or end stage renal disease: Secondary | ICD-10-CM | POA: Diagnosis not present

## 2019-11-18 DIAGNOSIS — N186 End stage renal disease: Secondary | ICD-10-CM | POA: Diagnosis not present

## 2019-11-18 DIAGNOSIS — Z992 Dependence on renal dialysis: Secondary | ICD-10-CM | POA: Diagnosis not present

## 2019-11-18 DIAGNOSIS — D509 Iron deficiency anemia, unspecified: Secondary | ICD-10-CM | POA: Diagnosis not present

## 2019-11-18 DIAGNOSIS — N2581 Secondary hyperparathyroidism of renal origin: Secondary | ICD-10-CM | POA: Diagnosis not present

## 2019-11-18 DIAGNOSIS — D688 Other specified coagulation defects: Secondary | ICD-10-CM | POA: Diagnosis not present

## 2019-11-20 DIAGNOSIS — N2581 Secondary hyperparathyroidism of renal origin: Secondary | ICD-10-CM | POA: Diagnosis not present

## 2019-11-20 DIAGNOSIS — N186 End stage renal disease: Secondary | ICD-10-CM | POA: Diagnosis not present

## 2019-11-20 DIAGNOSIS — M3210 Systemic lupus erythematosus, organ or system involvement unspecified: Secondary | ICD-10-CM | POA: Diagnosis not present

## 2019-11-20 DIAGNOSIS — D688 Other specified coagulation defects: Secondary | ICD-10-CM | POA: Diagnosis not present

## 2019-11-20 DIAGNOSIS — Z992 Dependence on renal dialysis: Secondary | ICD-10-CM | POA: Diagnosis not present

## 2019-11-20 DIAGNOSIS — R52 Pain, unspecified: Secondary | ICD-10-CM | POA: Diagnosis not present

## 2019-11-20 DIAGNOSIS — R519 Headache, unspecified: Secondary | ICD-10-CM | POA: Diagnosis not present

## 2019-11-23 DIAGNOSIS — D688 Other specified coagulation defects: Secondary | ICD-10-CM | POA: Diagnosis not present

## 2019-11-23 DIAGNOSIS — Z992 Dependence on renal dialysis: Secondary | ICD-10-CM | POA: Diagnosis not present

## 2019-11-23 DIAGNOSIS — M3210 Systemic lupus erythematosus, organ or system involvement unspecified: Secondary | ICD-10-CM | POA: Diagnosis not present

## 2019-11-23 DIAGNOSIS — R52 Pain, unspecified: Secondary | ICD-10-CM | POA: Diagnosis not present

## 2019-11-23 DIAGNOSIS — N186 End stage renal disease: Secondary | ICD-10-CM | POA: Diagnosis not present

## 2019-11-23 DIAGNOSIS — N2581 Secondary hyperparathyroidism of renal origin: Secondary | ICD-10-CM | POA: Diagnosis not present

## 2019-11-23 DIAGNOSIS — R519 Headache, unspecified: Secondary | ICD-10-CM | POA: Diagnosis not present

## 2019-11-25 DIAGNOSIS — R519 Headache, unspecified: Secondary | ICD-10-CM | POA: Diagnosis not present

## 2019-11-25 DIAGNOSIS — N2581 Secondary hyperparathyroidism of renal origin: Secondary | ICD-10-CM | POA: Diagnosis not present

## 2019-11-25 DIAGNOSIS — D688 Other specified coagulation defects: Secondary | ICD-10-CM | POA: Diagnosis not present

## 2019-11-25 DIAGNOSIS — R52 Pain, unspecified: Secondary | ICD-10-CM | POA: Diagnosis not present

## 2019-11-25 DIAGNOSIS — Z992 Dependence on renal dialysis: Secondary | ICD-10-CM | POA: Diagnosis not present

## 2019-11-25 DIAGNOSIS — M3210 Systemic lupus erythematosus, organ or system involvement unspecified: Secondary | ICD-10-CM | POA: Diagnosis not present

## 2019-11-25 DIAGNOSIS — N186 End stage renal disease: Secondary | ICD-10-CM | POA: Diagnosis not present

## 2019-11-26 ENCOUNTER — Ambulatory Visit (INDEPENDENT_AMBULATORY_CARE_PROVIDER_SITE_OTHER): Payer: Medicare HMO | Admitting: *Deleted

## 2019-11-26 ENCOUNTER — Other Ambulatory Visit: Payer: Self-pay

## 2019-11-26 DIAGNOSIS — Z954 Presence of other heart-valve replacement: Secondary | ICD-10-CM | POA: Diagnosis not present

## 2019-11-26 DIAGNOSIS — Z5181 Encounter for therapeutic drug level monitoring: Secondary | ICD-10-CM | POA: Diagnosis not present

## 2019-11-26 LAB — POCT INR: INR: 2.4 (ref 2.0–3.0)

## 2019-11-26 NOTE — Patient Instructions (Signed)
Description   Take 3.5 tablets today and then start taking 3 tablets daily except for 2 tablets on Mondays. Recheck INR in 3 weeks.  Call Coumadin clinic with any problems [phone].

## 2019-11-27 DIAGNOSIS — N186 End stage renal disease: Secondary | ICD-10-CM | POA: Diagnosis not present

## 2019-11-27 DIAGNOSIS — N2581 Secondary hyperparathyroidism of renal origin: Secondary | ICD-10-CM | POA: Diagnosis not present

## 2019-11-27 DIAGNOSIS — R519 Headache, unspecified: Secondary | ICD-10-CM | POA: Diagnosis not present

## 2019-11-27 DIAGNOSIS — R52 Pain, unspecified: Secondary | ICD-10-CM | POA: Diagnosis not present

## 2019-11-27 DIAGNOSIS — D688 Other specified coagulation defects: Secondary | ICD-10-CM | POA: Diagnosis not present

## 2019-11-27 DIAGNOSIS — M3210 Systemic lupus erythematosus, organ or system involvement unspecified: Secondary | ICD-10-CM | POA: Diagnosis not present

## 2019-11-27 DIAGNOSIS — Z992 Dependence on renal dialysis: Secondary | ICD-10-CM | POA: Diagnosis not present

## 2019-11-30 DIAGNOSIS — R52 Pain, unspecified: Secondary | ICD-10-CM | POA: Diagnosis not present

## 2019-11-30 DIAGNOSIS — Z992 Dependence on renal dialysis: Secondary | ICD-10-CM | POA: Diagnosis not present

## 2019-11-30 DIAGNOSIS — D688 Other specified coagulation defects: Secondary | ICD-10-CM | POA: Diagnosis not present

## 2019-11-30 DIAGNOSIS — M3210 Systemic lupus erythematosus, organ or system involvement unspecified: Secondary | ICD-10-CM | POA: Diagnosis not present

## 2019-11-30 DIAGNOSIS — N186 End stage renal disease: Secondary | ICD-10-CM | POA: Diagnosis not present

## 2019-11-30 DIAGNOSIS — R519 Headache, unspecified: Secondary | ICD-10-CM | POA: Diagnosis not present

## 2019-11-30 DIAGNOSIS — N2581 Secondary hyperparathyroidism of renal origin: Secondary | ICD-10-CM | POA: Diagnosis not present

## 2019-12-02 ENCOUNTER — Telehealth: Payer: Self-pay | Admitting: Internal Medicine

## 2019-12-02 DIAGNOSIS — Z992 Dependence on renal dialysis: Secondary | ICD-10-CM | POA: Diagnosis not present

## 2019-12-02 DIAGNOSIS — N186 End stage renal disease: Secondary | ICD-10-CM | POA: Diagnosis not present

## 2019-12-02 DIAGNOSIS — R519 Headache, unspecified: Secondary | ICD-10-CM | POA: Diagnosis not present

## 2019-12-02 DIAGNOSIS — R52 Pain, unspecified: Secondary | ICD-10-CM | POA: Diagnosis not present

## 2019-12-02 DIAGNOSIS — D688 Other specified coagulation defects: Secondary | ICD-10-CM | POA: Diagnosis not present

## 2019-12-02 DIAGNOSIS — M3210 Systemic lupus erythematosus, organ or system involvement unspecified: Secondary | ICD-10-CM | POA: Diagnosis not present

## 2019-12-02 DIAGNOSIS — N2581 Secondary hyperparathyroidism of renal origin: Secondary | ICD-10-CM | POA: Diagnosis not present

## 2019-12-02 NOTE — Telephone Encounter (Signed)
°  ° °  [facility] Pre-operative Risk Assessment    HEARTCARE STAFF: - Please ensure there is not already an duplicate clearance open for this procedure. - Under Visit Info/Reason for Call, type in Other and utilize the format Clearance MM/DD/YY or Clearance TBD. Do not use dashes or single digits. - If request is for dental extraction, please clarify the # of teeth to be extracted.  Request for surgical clearance:  1. What type of surgery is being performed? extractions of 3 teeth  2. When is this surgery scheduled? TBD  3. What type of clearance is required (medical clearance vs. Pharmacy clearance to hold med vs. Both)? pharmacy  4. Are there any medications that need to be held prior to surgery and how long? coumadin  5. Practice name and name of physician performing surgery? Dr Hoyt Koch, [facility]  6. What is the office phone number? [phone]   7.   What is the office fax number? [phone]  8.   Anesthesia type (None, local, MAC, general) ? Local   [name] 12/02/2019, 1:34 PM  _________________________________________________________________   (provider comments below)

## 2019-12-02 NOTE — Telephone Encounter (Signed)
Dr Harrington Challenger can comment on Lovenox crossover in this patient who has mechanical heart valves, is a dialysis patient, and needs multiple (3) teeth pulled. I would assume they would want to do the extraction all at once since staggering multiple tooth extractions around dialysis may be problematic.  Kerin Ransom PA-C 12/02/2019 3:43 PM

## 2019-12-02 NOTE — Telephone Encounter (Signed)
Patient with diagnosis of mechanical aortic and mitral valve replacements on warfarin for anticoagulation.    Procedure: 3 dental extractions Date of procedure: TBD  Pt requires preop antibiotics and should take 2g of amoxicillin 1 hour prior to dental procedure. Rx is already active on med list with refills.  Would see how many days office requires pt to hold warfarin for, or if they are able to separate extractions on different dates so that she can continue on her warfarin. Otherwise, she would typically require Lovenox bridging if she is off of her warfarin for an extended duration due to her mechanical valves, but she is also on dialysis (limited data regarding Lovenox use in this patient population).

## 2019-12-03 NOTE — Telephone Encounter (Signed)
Agree, would see if dentist/oral surgeon is comfortable doing 3 extractions while pt remains on warfarin with INR at lower end of therapeutic range. If not, would see if extractions are able to be done on separate dates so that she does not need to interrupt her warfarin.

## 2019-12-03 NOTE — Telephone Encounter (Signed)
   Primary Cardiologist: Dorris Carnes, MD  Chart reviewed as part of pre-operative protocol coverage.   Per pharmacy and Dr. Harrington Challenger' recommendations, would prefer to not interrupt coumadin therapy at this time as there is limited data on lovenox bridging in patients on dialysis with mechanical valves. If okay with Dr. Hoyt Koch, can proceed with removal of 3 teeth on coumadin vs staged extractions to limit interruption.  Patient DOES require preop antibiotics and should take 2g of amoxicillin 1 hour prior to dental procedure.    I will route this recommendation to the requesting party via Epic fax function and remove from pre-op pool.  Please call with questions.  Abigail Butts, PA-C 12/03/2019, 10:51 AM

## 2019-12-03 NOTE — Telephone Encounter (Signed)
Forwarding for your recommendations. Plan for dental extraction   Ques re coumadin    [facility]Ith INR 2.4 I was going to continue meds, use local measures for controlling bleeding    Thoughts?

## 2019-12-04 DIAGNOSIS — R519 Headache, unspecified: Secondary | ICD-10-CM | POA: Diagnosis not present

## 2019-12-04 DIAGNOSIS — M3210 Systemic lupus erythematosus, organ or system involvement unspecified: Secondary | ICD-10-CM | POA: Diagnosis not present

## 2019-12-04 DIAGNOSIS — R52 Pain, unspecified: Secondary | ICD-10-CM | POA: Diagnosis not present

## 2019-12-04 DIAGNOSIS — D688 Other specified coagulation defects: Secondary | ICD-10-CM | POA: Diagnosis not present

## 2019-12-04 DIAGNOSIS — Z992 Dependence on renal dialysis: Secondary | ICD-10-CM | POA: Diagnosis not present

## 2019-12-04 DIAGNOSIS — N2581 Secondary hyperparathyroidism of renal origin: Secondary | ICD-10-CM | POA: Diagnosis not present

## 2019-12-04 DIAGNOSIS — N186 End stage renal disease: Secondary | ICD-10-CM | POA: Diagnosis not present

## 2019-12-06 DIAGNOSIS — J9811 Atelectasis: Secondary | ICD-10-CM | POA: Diagnosis not present

## 2019-12-06 DIAGNOSIS — I050 Rheumatic mitral stenosis: Secondary | ICD-10-CM | POA: Diagnosis not present

## 2019-12-06 DIAGNOSIS — I350 Nonrheumatic aortic (valve) stenosis: Secondary | ICD-10-CM | POA: Diagnosis not present

## 2019-12-07 DIAGNOSIS — D688 Other specified coagulation defects: Secondary | ICD-10-CM | POA: Diagnosis not present

## 2019-12-07 DIAGNOSIS — N186 End stage renal disease: Secondary | ICD-10-CM | POA: Diagnosis not present

## 2019-12-07 DIAGNOSIS — R519 Headache, unspecified: Secondary | ICD-10-CM | POA: Diagnosis not present

## 2019-12-07 DIAGNOSIS — Z992 Dependence on renal dialysis: Secondary | ICD-10-CM | POA: Diagnosis not present

## 2019-12-07 DIAGNOSIS — R52 Pain, unspecified: Secondary | ICD-10-CM | POA: Diagnosis not present

## 2019-12-07 DIAGNOSIS — N2581 Secondary hyperparathyroidism of renal origin: Secondary | ICD-10-CM | POA: Diagnosis not present

## 2019-12-07 DIAGNOSIS — M3210 Systemic lupus erythematosus, organ or system involvement unspecified: Secondary | ICD-10-CM | POA: Diagnosis not present

## 2019-12-09 DIAGNOSIS — N2581 Secondary hyperparathyroidism of renal origin: Secondary | ICD-10-CM | POA: Diagnosis not present

## 2019-12-09 DIAGNOSIS — M3210 Systemic lupus erythematosus, organ or system involvement unspecified: Secondary | ICD-10-CM | POA: Diagnosis not present

## 2019-12-09 DIAGNOSIS — Z992 Dependence on renal dialysis: Secondary | ICD-10-CM | POA: Diagnosis not present

## 2019-12-09 DIAGNOSIS — D688 Other specified coagulation defects: Secondary | ICD-10-CM | POA: Diagnosis not present

## 2019-12-09 DIAGNOSIS — R52 Pain, unspecified: Secondary | ICD-10-CM | POA: Diagnosis not present

## 2019-12-09 DIAGNOSIS — N186 End stage renal disease: Secondary | ICD-10-CM | POA: Diagnosis not present

## 2019-12-09 DIAGNOSIS — R519 Headache, unspecified: Secondary | ICD-10-CM | POA: Diagnosis not present

## 2019-12-11 DIAGNOSIS — M3210 Systemic lupus erythematosus, organ or system involvement unspecified: Secondary | ICD-10-CM | POA: Diagnosis not present

## 2019-12-11 DIAGNOSIS — N2581 Secondary hyperparathyroidism of renal origin: Secondary | ICD-10-CM | POA: Diagnosis not present

## 2019-12-11 DIAGNOSIS — D688 Other specified coagulation defects: Secondary | ICD-10-CM | POA: Diagnosis not present

## 2019-12-11 DIAGNOSIS — R52 Pain, unspecified: Secondary | ICD-10-CM | POA: Diagnosis not present

## 2019-12-11 DIAGNOSIS — R519 Headache, unspecified: Secondary | ICD-10-CM | POA: Diagnosis not present

## 2019-12-11 DIAGNOSIS — Z992 Dependence on renal dialysis: Secondary | ICD-10-CM | POA: Diagnosis not present

## 2019-12-11 DIAGNOSIS — N186 End stage renal disease: Secondary | ICD-10-CM | POA: Diagnosis not present

## 2019-12-14 DIAGNOSIS — Z992 Dependence on renal dialysis: Secondary | ICD-10-CM | POA: Diagnosis not present

## 2019-12-14 DIAGNOSIS — N186 End stage renal disease: Secondary | ICD-10-CM | POA: Diagnosis not present

## 2019-12-14 DIAGNOSIS — M3210 Systemic lupus erythematosus, organ or system involvement unspecified: Secondary | ICD-10-CM | POA: Diagnosis not present

## 2019-12-14 DIAGNOSIS — R52 Pain, unspecified: Secondary | ICD-10-CM | POA: Diagnosis not present

## 2019-12-14 DIAGNOSIS — D688 Other specified coagulation defects: Secondary | ICD-10-CM | POA: Diagnosis not present

## 2019-12-14 DIAGNOSIS — R519 Headache, unspecified: Secondary | ICD-10-CM | POA: Diagnosis not present

## 2019-12-14 DIAGNOSIS — N2581 Secondary hyperparathyroidism of renal origin: Secondary | ICD-10-CM | POA: Diagnosis not present

## 2019-12-16 DIAGNOSIS — R52 Pain, unspecified: Secondary | ICD-10-CM | POA: Diagnosis not present

## 2019-12-16 DIAGNOSIS — N186 End stage renal disease: Secondary | ICD-10-CM | POA: Diagnosis not present

## 2019-12-16 DIAGNOSIS — N2581 Secondary hyperparathyroidism of renal origin: Secondary | ICD-10-CM | POA: Diagnosis not present

## 2019-12-16 DIAGNOSIS — Z992 Dependence on renal dialysis: Secondary | ICD-10-CM | POA: Diagnosis not present

## 2019-12-16 DIAGNOSIS — D688 Other specified coagulation defects: Secondary | ICD-10-CM | POA: Diagnosis not present

## 2019-12-16 DIAGNOSIS — R519 Headache, unspecified: Secondary | ICD-10-CM | POA: Diagnosis not present

## 2019-12-16 DIAGNOSIS — M3210 Systemic lupus erythematosus, organ or system involvement unspecified: Secondary | ICD-10-CM | POA: Diagnosis not present

## 2019-12-17 ENCOUNTER — Ambulatory Visit (INDEPENDENT_AMBULATORY_CARE_PROVIDER_SITE_OTHER): Payer: Medicare HMO | Admitting: *Deleted

## 2019-12-17 ENCOUNTER — Other Ambulatory Visit: Payer: Self-pay

## 2019-12-17 DIAGNOSIS — Z5181 Encounter for therapeutic drug level monitoring: Secondary | ICD-10-CM | POA: Diagnosis not present

## 2019-12-17 DIAGNOSIS — Z954 Presence of other heart-valve replacement: Secondary | ICD-10-CM | POA: Diagnosis not present

## 2019-12-17 LAB — POCT INR: INR: 2.6 (ref 2.0–3.0)

## 2019-12-17 NOTE — Patient Instructions (Signed)
Description   Continue taking Warfarin 3 tablets daily except for 2 tablets on Mondays. Recheck INR in 4 weeks. Call Coumadin clinic with any problems [phone].

## 2019-12-18 DIAGNOSIS — D688 Other specified coagulation defects: Secondary | ICD-10-CM | POA: Diagnosis not present

## 2019-12-18 DIAGNOSIS — R52 Pain, unspecified: Secondary | ICD-10-CM | POA: Diagnosis not present

## 2019-12-18 DIAGNOSIS — R519 Headache, unspecified: Secondary | ICD-10-CM | POA: Diagnosis not present

## 2019-12-18 DIAGNOSIS — N186 End stage renal disease: Secondary | ICD-10-CM | POA: Diagnosis not present

## 2019-12-18 DIAGNOSIS — Z992 Dependence on renal dialysis: Secondary | ICD-10-CM | POA: Diagnosis not present

## 2019-12-18 DIAGNOSIS — M3210 Systemic lupus erythematosus, organ or system involvement unspecified: Secondary | ICD-10-CM | POA: Diagnosis not present

## 2019-12-18 DIAGNOSIS — N2581 Secondary hyperparathyroidism of renal origin: Secondary | ICD-10-CM | POA: Diagnosis not present

## 2019-12-19 DIAGNOSIS — Z992 Dependence on renal dialysis: Secondary | ICD-10-CM | POA: Diagnosis not present

## 2019-12-19 DIAGNOSIS — N186 End stage renal disease: Secondary | ICD-10-CM | POA: Diagnosis not present

## 2019-12-19 DIAGNOSIS — I120 Hypertensive chronic kidney disease with stage 5 chronic kidney disease or end stage renal disease: Secondary | ICD-10-CM | POA: Diagnosis not present

## 2019-12-20 DIAGNOSIS — Z992 Dependence on renal dialysis: Secondary | ICD-10-CM | POA: Diagnosis not present

## 2019-12-20 DIAGNOSIS — I120 Hypertensive chronic kidney disease with stage 5 chronic kidney disease or end stage renal disease: Secondary | ICD-10-CM | POA: Diagnosis not present

## 2019-12-20 DIAGNOSIS — N186 End stage renal disease: Secondary | ICD-10-CM | POA: Diagnosis not present

## 2019-12-21 DIAGNOSIS — M3210 Systemic lupus erythematosus, organ or system involvement unspecified: Secondary | ICD-10-CM | POA: Diagnosis not present

## 2019-12-21 DIAGNOSIS — Z992 Dependence on renal dialysis: Secondary | ICD-10-CM | POA: Diagnosis not present

## 2019-12-21 DIAGNOSIS — N2581 Secondary hyperparathyroidism of renal origin: Secondary | ICD-10-CM | POA: Diagnosis not present

## 2019-12-21 DIAGNOSIS — N186 End stage renal disease: Secondary | ICD-10-CM | POA: Diagnosis not present

## 2019-12-21 DIAGNOSIS — R519 Headache, unspecified: Secondary | ICD-10-CM | POA: Diagnosis not present

## 2019-12-21 DIAGNOSIS — D688 Other specified coagulation defects: Secondary | ICD-10-CM | POA: Diagnosis not present

## 2019-12-23 DIAGNOSIS — D688 Other specified coagulation defects: Secondary | ICD-10-CM | POA: Diagnosis not present

## 2019-12-23 DIAGNOSIS — N186 End stage renal disease: Secondary | ICD-10-CM | POA: Diagnosis not present

## 2019-12-23 DIAGNOSIS — Z992 Dependence on renal dialysis: Secondary | ICD-10-CM | POA: Diagnosis not present

## 2019-12-23 DIAGNOSIS — M3210 Systemic lupus erythematosus, organ or system involvement unspecified: Secondary | ICD-10-CM | POA: Diagnosis not present

## 2019-12-23 DIAGNOSIS — R519 Headache, unspecified: Secondary | ICD-10-CM | POA: Diagnosis not present

## 2019-12-23 DIAGNOSIS — N2581 Secondary hyperparathyroidism of renal origin: Secondary | ICD-10-CM | POA: Diagnosis not present

## 2019-12-25 DIAGNOSIS — M3210 Systemic lupus erythematosus, organ or system involvement unspecified: Secondary | ICD-10-CM | POA: Diagnosis not present

## 2019-12-25 DIAGNOSIS — N2581 Secondary hyperparathyroidism of renal origin: Secondary | ICD-10-CM | POA: Diagnosis not present

## 2019-12-25 DIAGNOSIS — R519 Headache, unspecified: Secondary | ICD-10-CM | POA: Diagnosis not present

## 2019-12-25 DIAGNOSIS — N186 End stage renal disease: Secondary | ICD-10-CM | POA: Diagnosis not present

## 2019-12-25 DIAGNOSIS — D688 Other specified coagulation defects: Secondary | ICD-10-CM | POA: Diagnosis not present

## 2019-12-25 DIAGNOSIS — Z992 Dependence on renal dialysis: Secondary | ICD-10-CM | POA: Diagnosis not present

## 2019-12-28 DIAGNOSIS — N2581 Secondary hyperparathyroidism of renal origin: Secondary | ICD-10-CM | POA: Diagnosis not present

## 2019-12-28 DIAGNOSIS — M3210 Systemic lupus erythematosus, organ or system involvement unspecified: Secondary | ICD-10-CM | POA: Diagnosis not present

## 2019-12-28 DIAGNOSIS — D688 Other specified coagulation defects: Secondary | ICD-10-CM | POA: Diagnosis not present

## 2019-12-28 DIAGNOSIS — N186 End stage renal disease: Secondary | ICD-10-CM | POA: Diagnosis not present

## 2019-12-28 DIAGNOSIS — R519 Headache, unspecified: Secondary | ICD-10-CM | POA: Diagnosis not present

## 2019-12-28 DIAGNOSIS — Z992 Dependence on renal dialysis: Secondary | ICD-10-CM | POA: Diagnosis not present

## 2019-12-30 DIAGNOSIS — N186 End stage renal disease: Secondary | ICD-10-CM | POA: Diagnosis not present

## 2019-12-30 DIAGNOSIS — D688 Other specified coagulation defects: Secondary | ICD-10-CM | POA: Diagnosis not present

## 2019-12-30 DIAGNOSIS — M3210 Systemic lupus erythematosus, organ or system involvement unspecified: Secondary | ICD-10-CM | POA: Diagnosis not present

## 2019-12-30 DIAGNOSIS — R519 Headache, unspecified: Secondary | ICD-10-CM | POA: Diagnosis not present

## 2019-12-30 DIAGNOSIS — Z992 Dependence on renal dialysis: Secondary | ICD-10-CM | POA: Diagnosis not present

## 2019-12-30 DIAGNOSIS — N2581 Secondary hyperparathyroidism of renal origin: Secondary | ICD-10-CM | POA: Diagnosis not present

## 2020-01-01 DIAGNOSIS — D688 Other specified coagulation defects: Secondary | ICD-10-CM | POA: Diagnosis not present

## 2020-01-01 DIAGNOSIS — R519 Headache, unspecified: Secondary | ICD-10-CM | POA: Diagnosis not present

## 2020-01-01 DIAGNOSIS — N186 End stage renal disease: Secondary | ICD-10-CM | POA: Diagnosis not present

## 2020-01-01 DIAGNOSIS — N2581 Secondary hyperparathyroidism of renal origin: Secondary | ICD-10-CM | POA: Diagnosis not present

## 2020-01-01 DIAGNOSIS — Z992 Dependence on renal dialysis: Secondary | ICD-10-CM | POA: Diagnosis not present

## 2020-01-01 DIAGNOSIS — M3210 Systemic lupus erythematosus, organ or system involvement unspecified: Secondary | ICD-10-CM | POA: Diagnosis not present

## 2020-01-04 DIAGNOSIS — D688 Other specified coagulation defects: Secondary | ICD-10-CM | POA: Diagnosis not present

## 2020-01-04 DIAGNOSIS — N186 End stage renal disease: Secondary | ICD-10-CM | POA: Diagnosis not present

## 2020-01-04 DIAGNOSIS — N2581 Secondary hyperparathyroidism of renal origin: Secondary | ICD-10-CM | POA: Diagnosis not present

## 2020-01-04 DIAGNOSIS — Z992 Dependence on renal dialysis: Secondary | ICD-10-CM | POA: Diagnosis not present

## 2020-01-04 DIAGNOSIS — M3210 Systemic lupus erythematosus, organ or system involvement unspecified: Secondary | ICD-10-CM | POA: Diagnosis not present

## 2020-01-04 DIAGNOSIS — R519 Headache, unspecified: Secondary | ICD-10-CM | POA: Diagnosis not present

## 2020-01-05 ENCOUNTER — Other Ambulatory Visit: Payer: Medicare HMO

## 2020-01-06 ENCOUNTER — Other Ambulatory Visit: Payer: Self-pay

## 2020-01-06 ENCOUNTER — Emergency Department (HOSPITAL_COMMUNITY): Payer: Medicare HMO

## 2020-01-06 ENCOUNTER — Encounter (HOSPITAL_COMMUNITY): Payer: Self-pay | Admitting: *Deleted

## 2020-01-06 ENCOUNTER — Inpatient Hospital Stay (HOSPITAL_COMMUNITY)
Admission: EM | Admit: 2020-01-06 | Discharge: 2020-02-12 | DRG: 356 | Disposition: A | Discharge status: Home / Self Care | Payer: Medicare HMO | Attending: Internal Medicine | Admitting: Internal Medicine

## 2020-01-06 ENCOUNTER — Inpatient Hospital Stay (HOSPITAL_COMMUNITY): Payer: Medicare HMO

## 2020-01-06 DIAGNOSIS — K5730 Diverticulosis of large intestine without perforation or abscess without bleeding: Secondary | ICD-10-CM | POA: Diagnosis not present

## 2020-01-06 DIAGNOSIS — R571 Hypovolemic shock: Secondary | ICD-10-CM | POA: Diagnosis not present

## 2020-01-06 DIAGNOSIS — I5030 Unspecified diastolic (congestive) heart failure: Secondary | ICD-10-CM | POA: Diagnosis not present

## 2020-01-06 DIAGNOSIS — Z7982 Long term (current) use of aspirin: Secondary | ICD-10-CM

## 2020-01-06 DIAGNOSIS — Z8711 Personal history of peptic ulcer disease: Secondary | ICD-10-CM

## 2020-01-06 DIAGNOSIS — Z87891 Personal history of nicotine dependence: Secondary | ICD-10-CM

## 2020-01-06 DIAGNOSIS — N186 End stage renal disease: Secondary | ICD-10-CM | POA: Diagnosis present

## 2020-01-06 DIAGNOSIS — R1032 Left lower quadrant pain: Secondary | ICD-10-CM | POA: Diagnosis not present

## 2020-01-06 DIAGNOSIS — Z8249 Family history of ischemic heart disease and other diseases of the circulatory system: Secondary | ICD-10-CM

## 2020-01-06 DIAGNOSIS — Z20822 Contact with and (suspected) exposure to covid-19: Secondary | ICD-10-CM | POA: Diagnosis present

## 2020-01-06 DIAGNOSIS — I10 Essential (primary) hypertension: Secondary | ICD-10-CM | POA: Diagnosis not present

## 2020-01-06 DIAGNOSIS — K30 Functional dyspepsia: Secondary | ICD-10-CM | POA: Diagnosis not present

## 2020-01-06 DIAGNOSIS — R58 Hemorrhage, not elsewhere classified: Secondary | ICD-10-CM | POA: Diagnosis not present

## 2020-01-06 DIAGNOSIS — M329 Systemic lupus erythematosus, unspecified: Secondary | ICD-10-CM | POA: Diagnosis present

## 2020-01-06 DIAGNOSIS — Z954 Presence of other heart-valve replacement: Secondary | ICD-10-CM

## 2020-01-06 DIAGNOSIS — R509 Fever, unspecified: Secondary | ICD-10-CM | POA: Diagnosis not present

## 2020-01-06 DIAGNOSIS — K824 Cholesterolosis of gallbladder: Secondary | ICD-10-CM | POA: Diagnosis not present

## 2020-01-06 DIAGNOSIS — K661 Hemoperitoneum: Principal | ICD-10-CM

## 2020-01-06 DIAGNOSIS — K5900 Constipation, unspecified: Secondary | ICD-10-CM | POA: Diagnosis not present

## 2020-01-06 DIAGNOSIS — F40240 Claustrophobia: Secondary | ICD-10-CM | POA: Diagnosis present

## 2020-01-06 DIAGNOSIS — R578 Other shock: Secondary | ICD-10-CM | POA: Diagnosis not present

## 2020-01-06 DIAGNOSIS — R52 Pain, unspecified: Secondary | ICD-10-CM

## 2020-01-06 DIAGNOSIS — D500 Iron deficiency anemia secondary to blood loss (chronic): Secondary | ICD-10-CM | POA: Diagnosis not present

## 2020-01-06 DIAGNOSIS — N2581 Secondary hyperparathyroidism of renal origin: Secondary | ICD-10-CM | POA: Diagnosis present

## 2020-01-06 DIAGNOSIS — R12 Heartburn: Secondary | ICD-10-CM | POA: Diagnosis not present

## 2020-01-06 DIAGNOSIS — M1990 Unspecified osteoarthritis, unspecified site: Secondary | ICD-10-CM | POA: Diagnosis present

## 2020-01-06 DIAGNOSIS — N280 Ischemia and infarction of kidney: Secondary | ICD-10-CM | POA: Diagnosis present

## 2020-01-06 DIAGNOSIS — Z79899 Other long term (current) drug therapy: Secondary | ICD-10-CM

## 2020-01-06 DIAGNOSIS — J9811 Atelectasis: Secondary | ICD-10-CM | POA: Diagnosis not present

## 2020-01-06 DIAGNOSIS — Z952 Presence of prosthetic heart valve: Secondary | ICD-10-CM

## 2020-01-06 DIAGNOSIS — R21 Rash and other nonspecific skin eruption: Secondary | ICD-10-CM | POA: Diagnosis not present

## 2020-01-06 DIAGNOSIS — S36892A Contusion of other intra-abdominal organs, initial encounter: Secondary | ICD-10-CM | POA: Diagnosis not present

## 2020-01-06 DIAGNOSIS — D6869 Other thrombophilia: Secondary | ICD-10-CM | POA: Diagnosis present

## 2020-01-06 DIAGNOSIS — Z841 Family history of disorders of kidney and ureter: Secondary | ICD-10-CM

## 2020-01-06 DIAGNOSIS — D6859 Other primary thrombophilia: Secondary | ICD-10-CM | POA: Diagnosis present

## 2020-01-06 DIAGNOSIS — R109 Unspecified abdominal pain: Secondary | ICD-10-CM

## 2020-01-06 DIAGNOSIS — N261 Atrophy of kidney (terminal): Secondary | ICD-10-CM | POA: Diagnosis not present

## 2020-01-06 DIAGNOSIS — N368 Other specified disorders of urethra: Secondary | ICD-10-CM | POA: Diagnosis not present

## 2020-01-06 DIAGNOSIS — Z992 Dependence on renal dialysis: Secondary | ICD-10-CM | POA: Diagnosis not present

## 2020-01-06 DIAGNOSIS — D6832 Hemorrhagic disorder due to extrinsic circulating anticoagulants: Secondary | ICD-10-CM | POA: Diagnosis present

## 2020-01-06 DIAGNOSIS — R9431 Abnormal electrocardiogram [ECG] [EKG]: Secondary | ICD-10-CM | POA: Diagnosis not present

## 2020-01-06 DIAGNOSIS — Z7901 Long term (current) use of anticoagulants: Secondary | ICD-10-CM | POA: Diagnosis not present

## 2020-01-06 DIAGNOSIS — K219 Gastro-esophageal reflux disease without esophagitis: Secondary | ICD-10-CM | POA: Diagnosis present

## 2020-01-06 DIAGNOSIS — K683 Retroperitoneal hematoma: Secondary | ICD-10-CM

## 2020-01-06 DIAGNOSIS — R310 Gross hematuria: Secondary | ICD-10-CM | POA: Diagnosis not present

## 2020-01-06 DIAGNOSIS — K922 Gastrointestinal hemorrhage, unspecified: Secondary | ICD-10-CM | POA: Diagnosis not present

## 2020-01-06 DIAGNOSIS — N2889 Other specified disorders of kidney and ureter: Secondary | ICD-10-CM | POA: Diagnosis present

## 2020-01-06 DIAGNOSIS — I350 Nonrheumatic aortic (valve) stenosis: Secondary | ICD-10-CM | POA: Diagnosis not present

## 2020-01-06 DIAGNOSIS — D631 Anemia in chronic kidney disease: Secondary | ICD-10-CM | POA: Diagnosis not present

## 2020-01-06 DIAGNOSIS — I5032 Chronic diastolic (congestive) heart failure: Secondary | ICD-10-CM | POA: Diagnosis present

## 2020-01-06 DIAGNOSIS — T8383XA Hemorrhage of genitourinary prosthetic devices, implants and grafts, initial encounter: Secondary | ICD-10-CM | POA: Diagnosis not present

## 2020-01-06 DIAGNOSIS — I700 Atherosclerosis of aorta: Secondary | ICD-10-CM | POA: Diagnosis not present

## 2020-01-06 DIAGNOSIS — I132 Hypertensive heart and chronic kidney disease with heart failure and with stage 5 chronic kidney disease, or end stage renal disease: Secondary | ICD-10-CM | POA: Diagnosis present

## 2020-01-06 DIAGNOSIS — D62 Acute posthemorrhagic anemia: Secondary | ICD-10-CM

## 2020-01-06 DIAGNOSIS — T45515A Adverse effect of anticoagulants, initial encounter: Secondary | ICD-10-CM | POA: Diagnosis present

## 2020-01-06 DIAGNOSIS — R000 Tachycardia, unspecified: Secondary | ICD-10-CM | POA: Diagnosis not present

## 2020-01-06 DIAGNOSIS — I050 Rheumatic mitral stenosis: Secondary | ICD-10-CM | POA: Diagnosis not present

## 2020-01-06 DIAGNOSIS — E871 Hypo-osmolality and hyponatremia: Secondary | ICD-10-CM | POA: Diagnosis not present

## 2020-01-06 DIAGNOSIS — D259 Leiomyoma of uterus, unspecified: Secondary | ICD-10-CM | POA: Diagnosis not present

## 2020-01-06 DIAGNOSIS — Z8701 Personal history of pneumonia (recurrent): Secondary | ICD-10-CM

## 2020-01-06 DIAGNOSIS — R1030 Lower abdominal pain, unspecified: Secondary | ICD-10-CM | POA: Diagnosis not present

## 2020-01-06 DIAGNOSIS — R102 Pelvic and perineal pain: Secondary | ICD-10-CM

## 2020-01-06 DIAGNOSIS — Z8673 Personal history of transient ischemic attack (TIA), and cerebral infarction without residual deficits: Secondary | ICD-10-CM

## 2020-01-06 LAB — SARS CORONAVIRUS 2 BY RT PCR (HOSPITAL ORDER, PERFORMED IN ~~LOC~~ HOSPITAL LAB): SARS Coronavirus 2: NEGATIVE

## 2020-01-06 LAB — COMPREHENSIVE METABOLIC PANEL
ALT: 19 U/L (ref 0–44)
AST: 28 U/L (ref 15–41)
Albumin: 3.5 g/dL (ref 3.5–5.0)
Alkaline Phosphatase: 110 U/L (ref 38–126)
Anion gap: 18 — ABNORMAL HIGH (ref 5–15)
BUN: 63 mg/dL — ABNORMAL HIGH (ref 6–20)
CO2: 27 mmol/L (ref 22–32)
Calcium: 9.3 mg/dL (ref 8.9–10.3)
Chloride: 91 mmol/L — ABNORMAL LOW (ref 98–111)
Creatinine, Ser: 9.54 mg/dL — ABNORMAL HIGH (ref 0.44–1.00)
GFR calc Af Amer: 5 mL/min — ABNORMAL LOW (ref >60)
GFR calc non Af Amer: 4 mL/min — ABNORMAL LOW (ref >60)
Glucose, Bld: 118 mg/dL — ABNORMAL HIGH (ref 70–99)
Potassium: 3.9 mmol/L (ref 3.5–5.1)
Sodium: 136 mmol/L (ref 135–145)
Total Bilirubin: 0.5 mg/dL (ref 0.3–1.2)
Total Protein: 8.4 g/dL — ABNORMAL HIGH (ref 6.5–8.1)

## 2020-01-06 LAB — CBC
HCT: 37.5 % (ref 36.0–46.0)
Hemoglobin: 11.6 g/dL — ABNORMAL LOW (ref 12.0–15.0)
MCH: 31.6 pg (ref 26.0–34.0)
MCHC: 30.9 g/dL (ref 30.0–36.0)
MCV: 102.2 fL — ABNORMAL HIGH (ref 80.0–100.0)
Platelets: 257 10*3/uL (ref 150–400)
RBC: 3.67 MIL/uL — ABNORMAL LOW (ref 3.87–5.11)
RDW: 14.8 % (ref 11.5–15.5)
WBC: 8.7 10*3/uL (ref 4.0–10.5)
nRBC: 0 % (ref 0.0–0.2)

## 2020-01-06 LAB — LIPASE, BLOOD: Lipase: 64 U/L — ABNORMAL HIGH (ref 11–51)

## 2020-01-06 LAB — I-STAT BETA HCG BLOOD, ED (MC, WL, AP ONLY): I-stat hCG, quantitative: <5 m[IU]/mL (ref <5)

## 2020-01-06 LAB — CBG MONITORING, ED: Glucose-Capillary: 174 mg/dL — ABNORMAL HIGH (ref 70–99)

## 2020-01-06 LAB — PREPARE RBC (CROSSMATCH)

## 2020-01-06 LAB — PROTIME-INR
INR: 3.6 — ABNORMAL HIGH (ref 0.8–1.2)
Prothrombin Time: 34.4 seconds — ABNORMAL HIGH (ref 11.4–15.2)

## 2020-01-06 LAB — HEMOGLOBIN AND HEMATOCRIT, BLOOD
HCT: 28.2 % — ABNORMAL LOW (ref 36.0–46.0)
Hemoglobin: 8.7 g/dL — ABNORMAL LOW (ref 12.0–15.0)

## 2020-01-06 MED ORDER — SODIUM CHLORIDE 0.9% IV SOLUTION: Freq: Once | INTRAVENOUS | Status: DC

## 2020-01-06 MED ORDER — PROTHROMBIN COMPLEX CONC HUMAN 500 UNITS IV KIT
1578.0000 [IU] | PACK | Status: DC
  Filled 2020-01-06: qty 1578

## 2020-01-06 MED ORDER — SODIUM CHLORIDE 0.9 % IV BOLUS: 250.0000 mL | Freq: Once | INTRAVENOUS | Status: DC

## 2020-01-06 MED ORDER — ONDANSETRON HCL 4 MG/2ML IJ SOLN
4.0000 mg | Freq: Once | INTRAMUSCULAR | Status: AC
  Administered 2020-01-06: 4 mg via INTRAVENOUS
  Filled 2020-01-06: qty 2

## 2020-01-06 MED ORDER — HYDROMORPHONE HCL 1 MG/ML IJ SOLN
1.0000 mg | Freq: Once | INTRAMUSCULAR | Status: AC
  Administered 2020-01-06: 1 mg via INTRAMUSCULAR
  Filled 2020-01-06: qty 1

## 2020-01-06 MED ORDER — OXYCODONE-ACETAMINOPHEN 5-325 MG PO TABS
1.0000 | ORAL_TABLET | ORAL | Status: AC | PRN
  Administered 2020-01-06 – 2020-01-07 (×2): 1 via ORAL
  Filled 2020-01-06 (×2): qty 1

## 2020-01-06 MED ORDER — MORPHINE SULFATE (PF) 4 MG/ML IV SOLN
4.0000 mg | Freq: Once | INTRAVENOUS | Status: AC
  Administered 2020-01-06: 4 mg via INTRAVENOUS
  Filled 2020-01-06: qty 1

## 2020-01-06 MED ORDER — VITAMIN K1 10 MG/ML IJ SOLN
10.0000 mg | INTRAVENOUS | Status: AC
  Administered 2020-01-06: 10 mg via INTRAVENOUS
  Filled 2020-01-06: qty 1

## 2020-01-06 MED ORDER — PANTOPRAZOLE SODIUM 40 MG IV SOLR
40.0000 mg | Freq: Every day | INTRAVENOUS | Status: DC
  Administered 2020-01-07 – 2020-01-16 (×11): 40 mg via INTRAVENOUS
  Filled 2020-01-06 (×11): qty 40

## 2020-01-06 NOTE — ED Triage Notes (Signed)
Pt states 0400 woke up with left flank and left upper abdominal pain.  Pt states she has had 3 bowel movements since.  She took Tylenol with no relief and vomited times one.  Pt is HD patient and last HD was Monday.  Pt is anuric.

## 2020-01-06 NOTE — ED Notes (Signed)
MD at bedside to attempt EJ IV d/t lack of access, emergent blood necessity and limb limitation d/t dialysis

## 2020-01-06 NOTE — ED Notes (Signed)
Dr Hilty at bedside. 

## 2020-01-06 NOTE — ED Provider Notes (Signed)
[facility] Emergency Department Provider Note MRN:  [id number]  Arrival date & time: 01/06/20     Chief Complaint   Flank Pain and Abdominal Pain   History of Present Illness   [name] is a 59 y.o. year-old female with a history of ESRD presenting to the ED with chief complaint of flank pain.  Location:  Left flank Duration:  10 hours Onset:  sudden Timing:  constant Description:  sharp Severity:  severe Exacerbating/Alleviating Factors:  None, cannot get comfortable Associated Symptoms:  Nausea and few episodes of NBNB emesis Pertinent Negatives:  No fever, no chest pain, no SOB, no abdominal pain, does not make urine     Review of Systems  A complete 10 system review of systems was obtained and all systems are negative except as noted in the HPI and PMH.   Patient's Health History    Past Medical History:  Diagnosis Date   Anemia    Arthritis    "joints" (10/23/2017)   Blood transfusion '08   [facility]; "low HgB" (10/23/2017)   Chronic diastolic CHF (congestive heart failure) ([place])    Claustrophobia    Dysfunctional uterine bleeding 12/19/2010   ESRD (end stage renal disease) on dialysis ([place])    "MWF; [name]." (10/23/2017)   GERD (gastroesophageal reflux disease)    Headache    Hemodialysis patient ([place])    right extremity port   History of hiatal hernia    Hx of cardiovascular stress test    Lexiscan Myoview 4/16:  Normal stress nuclear study, EF 59%   Hypertension    Lupus ([place])    "? kind" (10/23/2017)   Mitral stenosis    Echo 4/16:  EF 55-60%, no RWMA, Gr 1 DD, mod MS (mean 9 mmHg), mod LAE, mild RAE, PASP 65, mod to severe TR, trivial eff // Echo 6/19:  Mild LVH, EF 55-60, no RWMA, Gr 2 DD, mild to mod AS (Mean 20), severe MS (mean 17), massive LAE, PASP 48, trivial effusion    Peptic ulcer disease    Pneumonia 10/21/2017   S/P aortic valve replacement with metallic valve 12/29/5724   21 mm Sorin [employer]  bileaflet mechanical valve   S/P mitral valve replacement with metallic valve 06/24/5595   31 mm Sorin [employer] Optiform bileaflet mechanical valve   Stroke ([place])    per patient "they said i had a small stroke but i couldnt even tell"   Valvular heart disease (Aortic Stenosis and Mitral Stenosis) 09/25/2016   s/p mechanical AVR and MVR 11/2018 // Echo 02/2019: EF 65-70, mild LVH, mild LAE, normally functioning mechanical mitral valve prosthesis with no regurgitation or stenosis, trivial TR, normally functioning mechanical aortic valve prosthesis without regurgitation or stenosis     Past Surgical History:  Procedure Laterality Date   AORTIC VALVE REPLACEMENT  12/02/2018   AORTIC VALVE REPLACEMENT (AVR) USING CARBOMEDICS SUPRA-ANNULAR TOP HAT SIZE 21MM (N/A)   AORTIC VALVE REPLACEMENT N/A 12/02/2018   Procedure: AORTIC VALVE REPLACEMENT (AVR) USING CARBOMEDICS SUPRA-ANNULAR TOP HAT SIZE 21MM;  Surgeon: Rexene Alberts, MD;  Location: [place];  Service: Open Heart Surgery;  Laterality: N/A;   AV FISTULA PLACEMENT Right 02/25/2018   Procedure: INSERTION OF 47mm x 16cm ARTEGRAFT;  Surgeon: Waynetta Sandy, MD;  Location: [place];  Service: Vascular;  Laterality: Right;   COLONOSCOPY W/ BIOPSIES AND POLYPECTOMY     DIALYSIS FISTULA CREATION  2007   ENDOMETRIAL ABLATION     ESOPHAGOGASTRODUODENOSCOPY N/A 07/31/2014  Procedure: ESOPHAGOGASTRODUODENOSCOPY (EGD);  Surgeon: Clarene Essex, MD;  Location: [facility] ENDOSCOPY;  Service: Endoscopy;  Laterality: N/A;   FISTULOGRAM Right 02/25/2018   Procedure: FISTULOGRAM ARM;  Surgeon: Waynetta Sandy, MD;  Location: [place];  Service: Vascular;  Laterality: Right;   HEMATOMA EVACUATION Right 03/06/2018   Procedure: EVACUATION HEMATOMA RIGHT UPPER ARM;  Surgeon: Waynetta Sandy, MD;  Location: [place];  Service: Vascular;  Laterality: Right;   INSERTION OF ARTERIOVENOUS (AV) ARTEGRAFT ARM Right 09/26/2017   Procedure: INSERTION OF  ARTERIOVENOUS (AV) ARTEGRAFT INTO RIGHT ARM;  Surgeon: Waynetta Sandy, MD;  Location: [place];  Service: Vascular;  Laterality: Right;   INSERTION OF DIALYSIS CATHETER Right 03/06/2018   Procedure: INSERTION OF DIALYSIS CATHETER, right internal jugular;  Surgeon: Waynetta Sandy, MD;  Location: [place];  Service: Vascular;  Laterality: Right;   IR THORACENTESIS ASP PLEURAL SPACE W/IMG GUIDE  01/07/2019   MITRAL VALVE REPLACEMENT N/A 12/02/2018   Procedure: MITRAL VALVE (MV) REPLACEMENT USING CARBOMEDICS OPTIFORM SIZE 31MM;  Surgeon: Rexene Alberts, MD;  Location: [place];  Service: Open Heart Surgery;  Laterality: N/A;   REVISON OF ARTERIOVENOUS FISTULA Right 09/26/2017   Procedure: REVISION OF ARTERIOVENOUS FISTULA RIGHT ARM WITH ARTEGRAFT;  Surgeon: Waynetta Sandy, MD;  Location: [place];  Service: Vascular;  Laterality: Right;   REVISON OF ARTERIOVENOUS FISTULA Right 02/25/2018   Procedure: REVISION OF ARTERIOVENOUS FISTULA;  Surgeon: Waynetta Sandy, MD;  Location: [place];  Service: Vascular;  Laterality: Right;   RIGHT/LEFT HEART CATH AND CORONARY ANGIOGRAPHY N/A 07/24/2018   Procedure: RIGHT/LEFT HEART CATH AND CORONARY ANGIOGRAPHY;  Surgeon: Larey Dresser, MD;  Location: [place] [facility];  Service: Cardiovascular;  Laterality: N/A;   SHOULDER OPEN ROTATOR CUFF REPAIR Right 10/09/2016   Procedure: ROTATOR CUFF REPAIR SHOULDER OPEN partial acrominectomy and extensive synovectomy;  Surgeon: Latanya [name], MD;  Location: [facility];  Service: Orthopedics;  Laterality: Right;  RNFA   TEE WITHOUT CARDIOVERSION N/A 01/02/2018   Procedure: TRANSESOPHAGEAL ECHOCARDIOGRAM (TEE) Bubble Study;  Surgeon: Larey Dresser, MD;  Location: [facility] ENDOSCOPY;  Service: Cardiovascular;  Laterality: N/A;   TEE WITHOUT CARDIOVERSION N/A 12/02/2018   Procedure: TRANSESOPHAGEAL ECHOCARDIOGRAM (TEE);  Surgeon: Rexene Alberts, MD;  Location: [place];  Service: Open Heart Surgery;   Laterality: N/A;   TUBAL LIGATION      Family History  Problem Relation Age of Onset   Liver cancer Maternal Grandmother    Lymphoma Maternal Aunt    Hypertension Mother    Breast cancer Mother    Renal Disease Father    Hypertension Father    Heart attack Neg Hx     Social History   Socioeconomic History   Marital status: Single    Spouse name: Not on file   Number of children: Not on file   Years of education: Not on file   Highest education level: Not on file  Occupational History   Not on file  Tobacco Use   Smoking status: Former Smoker    Packs/day: 0.10    Years: 10.00    Pack years: 1.00    Types: Cigarettes    Start date: 10/21/2017    Quit date: 11/20/2018    Years since quitting: 1.1   Smokeless tobacco: Never Used  Vaping Use   Vaping Use: Never used  Substance and Sexual Activity   Alcohol use: Yes    Alcohol/week: 1.0 standard drink    Types: 1 Cans of  beer per week    Comment: occasional beer   Drug use: Not Currently    Types: Marijuana, Cocaine    Comment: marijuana 11/27/2018   Sexual activity: Not Currently  Other Topics Concern   Not on file  Social History Narrative   Not on file   Social Determinants of Health   Financial Resource Strain:    Difficulty of Paying Living Expenses:   Food Insecurity:    Worried About Charity fundraiser in the Last Year:    Arboriculturist in the Last Year:   Transportation Needs:    Film/video editor (Medical):    Lack of Transportation (Non-Medical):   Physical Activity:    Days of Exercise per Week:    Minutes of Exercise per Session:   Stress:    Feeling of Stress :   Social Connections:    Frequency of Communication with Friends and Family:    Frequency of Social Gatherings with Friends and Family:    Attends Religious Services:    Active Member of Clubs or Organizations:    Attends Archivist Meetings:    Marital Status:   Intimate Partner  Violence:    Fear of Current or Ex-Partner:    Emotionally Abused:    Physically Abused:    Sexually Abused:      Physical Exam   Vitals:   01/06/20 1408 01/06/20 1440  BP: (!) 184/96 (!) 190/94  Pulse: (!) 108 (!) 102  Resp: 16   Temp: (!) 97.5 F (36.4 C)   SpO2: 100% 100%    CONSTITUTIONAL:  Chronically ill-appearing, appears moderately uncomfortable due to pain NEURO:  Alert and oriented x 3, no focal deficits EYES:  eyes equal and reactive ENT/NECK:  no LAD, no JVD CARDIO:  regular rate, well-perfused, normal S1 and S2 PULM:  CTAB no wheezing or rhonchi GI/GU:  normal bowel sounds, non-distended, non-tender MSK/SPINE:  No gross deformities, no edema SKIN:  no rash, atraumatic PSYCH:  Appropriate speech and behavior  *Additional and/or pertinent findings included in MDM below  Diagnostic and Interventional Summary    EKG Interpretation  Date/Time:  Wednesday January 06 2020 08:59:29 EDT Ventricular Rate:  99 PR Interval:  146 QRS Duration: 82 QT Interval:  378 QTC Calculation: 485 R Axis:   84 Text Interpretation: Normal sinus rhythm Biatrial enlargement Nonspecific ST abnormality Prolonged QT Abnormal ECG Confirmed by Gerlene Fee [phone]) on 01/06/2020 2:23:16 PM      Labs Reviewed  LIPASE, BLOOD - Abnormal; Notable for the following components:      Result Value   Lipase 64 (*)    All other components within normal limits  COMPREHENSIVE METABOLIC PANEL - Abnormal; Notable for the following components:   Chloride 91 (*)    Glucose, Bld 118 (*)    BUN 63 (*)    Creatinine, Ser 9.54 (*)    Total Protein 8.4 (*)    GFR calc non Af Amer 4 (*)    GFR calc Af Amer 5 (*)    Anion gap 18 (*)    All other components within normal limits  CBC - Abnormal; Notable for the following components:   RBC 3.67 (*)    Hemoglobin 11.6 (*)    MCV 102.2 (*)    All other components within normal limits  URINALYSIS, ROUTINE W REFLEX MICROSCOPIC  PROTIME-INR  I-STAT  BETA HCG BLOOD, ED ([facility], [facility], [facility] ONLY)    CT RENAL STONE STUDY    (  Results Pending)    Medications  oxyCODONE-acetaminophen (PERCOCET/ROXICET) 5-325 MG per tablet 1 tablet (1 tablet Oral Given 01/06/20 0910)  HYDROmorphone (DILAUDID) injection 1 mg (1 mg Intramuscular Given 01/06/20 1442)  ondansetron (ZOFRAN) injection 4 mg (4 mg Intravenous Given 01/06/20 1458)     Procedures  /  Critical Care Procedures  ED Course and Medical Decision Making  I have reviewed the triage vital signs, the nursing notes, and pertinent available records from the EMR.  Listed above are laboratory and imaging tests that I personally ordered, reviewed, and interpreted and then considered in my medical decision making (see below for details).  Suspect kidney stone awaiting CT imaging.  Also considering splenic etiology or diverticulitis.  Providing dilaudid for pain.     Awaiting CT, signed out to oncoming provider at shift change.  Barth Kirks. Sedonia Small, [place] [email or web address]  Final Clinical Impressions(s) / ED Diagnoses     ICD-10-CM   1. Flank pain  R10.9     ED Discharge Orders    None       Discharge Instructions Discussed with and Provided to Patient:   Discharge Instructions   None       Maudie Flakes, MD 01/06/20 1510

## 2020-01-06 NOTE — Consult Note (Signed)
Reason for Consult: retroperitoneal bleeding Referring Physician: Falecia Vannatter is an 59 y.o. female.  HPI: 59 yo female with ESRD, s/p aortic and mitral valve replacement on coumadin presented with abdominal pain found to have retroperitoneal bleed. She complains of constant pain on her left side. Pain is intense and dull. She has never had a similar pain. She initially was hypertensive but progressed to hypotensive. Her blood work showed INR 3.6, Hgb initially 11 then to 8.  Past Medical History:  Diagnosis Date   Anemia    Arthritis    "joints" (10/23/2017)   Blood transfusion '08   [facility]; "low HgB" (10/23/2017)   Chronic diastolic CHF (congestive heart failure) ([place])    Claustrophobia    Dysfunctional uterine bleeding 12/19/2010   ESRD (end stage renal disease) on dialysis ([place])    "MWF; [name]." (10/23/2017)   GERD (gastroesophageal reflux disease)    Headache    Hemodialysis patient ([place])    right extremity port   History of hiatal hernia    Hx of cardiovascular stress test    Lexiscan Myoview 4/16:  Normal stress nuclear study, EF 59%   Hypertension    Lupus ([place])    "? kind" (10/23/2017)   Mitral stenosis    Echo 4/16:  EF 55-60%, no RWMA, Gr 1 DD, mod MS (mean 9 mmHg), mod LAE, mild RAE, PASP 65, mod to severe TR, trivial eff // Echo 6/19:  Mild LVH, EF 55-60, no RWMA, Gr 2 DD, mild to mod AS (Mean 20), severe MS (mean 17), massive LAE, PASP 48, trivial effusion    Peptic ulcer disease    Pneumonia 10/21/2017   S/P aortic valve replacement with metallic valve 7/74/1287   21 mm Sorin Carbomedics bileaflet mechanical valve   S/P mitral valve replacement with metallic valve 8/67/6720   31 mm Sorin Carbomedics Optiform bileaflet mechanical valve   Stroke ([place])    per patient "they said i had a small stroke but i couldnt even tell"   Valvular heart disease (Aortic Stenosis and Mitral Stenosis) 09/25/2016   s/p mechanical AVR and MVR  11/2018 // Echo 02/2019: EF 65-70, mild LVH, mild LAE, normally functioning mechanical mitral valve prosthesis with no regurgitation or stenosis, trivial TR, normally functioning mechanical aortic valve prosthesis without regurgitation or stenosis     Past Surgical History:  Procedure Laterality Date   AORTIC VALVE REPLACEMENT  12/02/2018   AORTIC VALVE REPLACEMENT (AVR) USING CARBOMEDICS SUPRA-ANNULAR TOP HAT SIZE 21MM (N/A)   AORTIC VALVE REPLACEMENT N/A 12/02/2018   Procedure: AORTIC VALVE REPLACEMENT (AVR) USING CARBOMEDICS SUPRA-ANNULAR TOP HAT SIZE 21MM;  Surgeon: Rexene Alberts, MD;  Location: [place];  Service: Open Heart Surgery;  Laterality: N/A;   AV FISTULA PLACEMENT Right 02/25/2018   Procedure: INSERTION OF 42mm x 16cm ARTEGRAFT;  Surgeon: Waynetta Sandy, MD;  Location: [place];  Service: Vascular;  Laterality: Right;   COLONOSCOPY W/ BIOPSIES AND POLYPECTOMY     DIALYSIS FISTULA CREATION  2007   ENDOMETRIAL ABLATION     ESOPHAGOGASTRODUODENOSCOPY N/A 07/31/2014   Procedure: ESOPHAGOGASTRODUODENOSCOPY (EGD);  Surgeon: Clarene Essex, MD;  Location: [facility] ENDOSCOPY;  Service: Endoscopy;  Laterality: N/A;   FISTULOGRAM Right 02/25/2018   Procedure: FISTULOGRAM ARM;  Surgeon: Waynetta Sandy, MD;  Location: [place];  Service: Vascular;  Laterality: Right;   HEMATOMA EVACUATION Right 03/06/2018   Procedure: EVACUATION HEMATOMA RIGHT UPPER ARM;  Surgeon: Waynetta Sandy, MD;  Location: [place];  Service: Vascular;  Laterality: Right;   INSERTION OF ARTERIOVENOUS (AV) ARTEGRAFT ARM Right 09/26/2017   Procedure: INSERTION OF ARTERIOVENOUS (AV) ARTEGRAFT INTO RIGHT ARM;  Surgeon: Waynetta Sandy, MD;  Location: [place];  Service: Vascular;  Laterality: Right;   INSERTION OF DIALYSIS CATHETER Right 03/06/2018   Procedure: INSERTION OF DIALYSIS CATHETER, right internal jugular;  Surgeon: Waynetta Sandy, MD;  Location: [place];  Service: Vascular;   Laterality: Right;   IR THORACENTESIS ASP PLEURAL SPACE W/IMG GUIDE  01/07/2019   MITRAL VALVE REPLACEMENT N/A 12/02/2018   Procedure: MITRAL VALVE (MV) REPLACEMENT USING CARBOMEDICS OPTIFORM SIZE 31MM;  Surgeon: Rexene Alberts, MD;  Location: [place];  Service: Open Heart Surgery;  Laterality: N/A;   REVISON OF ARTERIOVENOUS FISTULA Right 09/26/2017   Procedure: REVISION OF ARTERIOVENOUS FISTULA RIGHT ARM WITH ARTEGRAFT;  Surgeon: Waynetta Sandy, MD;  Location: [place];  Service: Vascular;  Laterality: Right;   REVISON OF ARTERIOVENOUS FISTULA Right 02/25/2018   Procedure: REVISION OF ARTERIOVENOUS FISTULA;  Surgeon: Waynetta Sandy, MD;  Location: [place];  Service: Vascular;  Laterality: Right;   RIGHT/LEFT HEART CATH AND CORONARY ANGIOGRAPHY N/A 07/24/2018   Procedure: RIGHT/LEFT HEART CATH AND CORONARY ANGIOGRAPHY;  Surgeon: Larey Dresser, MD;  Location: [place] [facility];  Service: Cardiovascular;  Laterality: N/A;   SHOULDER OPEN ROTATOR CUFF REPAIR Right 10/09/2016   Procedure: ROTATOR CUFF REPAIR SHOULDER OPEN partial acrominectomy and extensive synovectomy;  Surgeon: Latanya [name], MD;  Location: [facility];  Service: Orthopedics;  Laterality: Right;  RNFA   TEE WITHOUT CARDIOVERSION N/A 01/02/2018   Procedure: TRANSESOPHAGEAL ECHOCARDIOGRAM (TEE) Bubble Study;  Surgeon: Larey Dresser, MD;  [facility];  Service: Cardiovascular;  Laterality: N/A;   TEE WITHOUT CARDIOVERSION N/A 12/02/2018   Procedure: TRANSESOPHAGEAL ECHOCARDIOGRAM (TEE);  Surgeon: Rexene Alberts, MD;  Location: [place];  Service: Open Heart Surgery;  Laterality: N/A;   TUBAL LIGATION      Family History  Problem Relation Age of Onset   Liver cancer Maternal Grandmother    Lymphoma Maternal Aunt    Hypertension Mother    Breast cancer Mother    Renal Disease Father    Hypertension Father    Heart attack Neg Hx     Social History:  reports that she quit smoking about 13  months ago. Her smoking use included cigarettes. She started smoking about 2 years ago. She has a 1.00 pack-year smoking history. She has never used smokeless tobacco. She reports current alcohol use of about 1.0 standard drink of alcohol per week. She reports previous drug use. Drugs: Marijuana and Cocaine.  Allergies: No Known Allergies  Medications: I have reviewed the patient's current medications.  Results for orders placed or performed during the hospital encounter of 01/06/20 (from the past 48 hour(s))  Lipase, blood     Status: Abnormal   Collection Time: 01/06/20  9:11 AM  Result Value Ref Range   Lipase 64 (H) 11 - 51 U/L    Comment: Performed at [facility] [address] [place], [place] [address]  Comprehensive metabolic panel     Status: Abnormal   Collection Time: 01/06/20  9:11 AM  Result Value Ref Range   Sodium 136 135 - 145 mmol/L   Potassium 3.9 3.5 - 5.1 mmol/L   Chloride 91 (L) 98 - 111 mmol/L   CO2 27 22 - 32 mmol/L   Glucose, Bld 118 (H) 70 - 99 mg/dL  Comment: Glucose reference range applies only to samples taken after fasting for at least 8 hours.   BUN 63 (H) 6 - 20 mg/dL   Creatinine, Ser 9.54 (H) 0.44 - 1.00 mg/dL   Calcium 9.3 8.9 - 10.3 mg/dL   Total Protein 8.4 (H) 6.5 - 8.1 g/dL   Albumin 3.5 3.5 - 5.0 g/dL   AST 28 15 - 41 U/L   ALT 19 0 - 44 U/L   Alkaline Phosphatase 110 38 - 126 U/L   Total Bilirubin 0.5 0.3 - 1.2 mg/dL   GFR calc non Af Amer 4 (L) >60 mL/min   GFR calc Af Amer 5 (L) >60 mL/min   Anion gap 18 (H) 5 - 15    Comment: Performed at [facility] [address], [place], [place] [address]  CBC     Status: Abnormal   Collection Time: 01/06/20  9:11 AM  Result Value Ref Range   WBC 8.7 4.0 - 10.5 K/uL   RBC 3.67 (L) 3.87 - 5.11 MIL/uL   Hemoglobin 11.6 (L) 12.0 - 15.0 g/dL   HCT 37.5 36 - 46 %   MCV 102.2 (H) 80.0 - 100.0 fL   MCH 31.6 26.0 - 34.0 pg   MCHC 30.9 30.0 - 36.0 g/dL   RDW 14.8 11.5 - 15.5 %    Platelets 257 150 - 400 K/uL   nRBC 0.0 0.0 - 0.2 %    Comment: Performed at [facility], [place] [address] [place], [place] [address]  I-Stat beta hCG blood, ED     Status: None   Collection Time: 01/06/20  9:16 AM  Result Value Ref Range   I-stat hCG, quantitative <5.0 <5 mIU/mL   Comment 3            Comment:   GEST. AGE      CONC.  (mIU/mL)   <=1 WEEK        5 - 50     2 WEEKS       50 - 500     3 WEEKS       100 - 10,000     4 WEEKS     1,000 - 30,000        FEMALE AND NON-PREGNANT FEMALE:     LESS THAN 5 mIU/mL   Type and screen [place]     Status: None   Collection Time: 01/06/20  5:20 PM  Result Value Ref Range   ABO/RH(D) A NEG    Antibody Screen NEG    Sample Expiration      01/09/2020,2359 Performed at [facility], [place] [address] [place], [place] [address]   SARS Coronavirus 2 by RT PCR (hospital order, performed in [facility] hospital lab) Nasopharyngeal Nasopharyngeal Swab     Status: None   Collection Time: 01/06/20  5:20 PM   Specimen: Nasopharyngeal Swab  Result Value Ref Range   SARS Coronavirus 2 NEGATIVE NEGATIVE    Comment: (NOTE) SARS-CoV-2 target nucleic acids are NOT DETECTED.  The SARS-CoV-2 RNA is generally detectable in upper and lower respiratory specimens during the acute phase of infection. The lowest concentration of SARS-CoV-2 viral copies this assay can detect is 250 copies / mL. A negative result does not preclude SARS-CoV-2 infection and should not be used as the sole basis for treatment or other patient management decisions.  A negative result may occur with improper specimen collection / handling, submission of specimen  other than nasopharyngeal swab, presence of viral mutation(s) within the areas targeted by this assay, and inadequate number of viral copies (<250 copies / mL). A negative result must be combined with clinical observations, patient history, and epidemiological information.  Fact Sheet  for Patients:   StrictlyIdeas.no  Fact Sheet for Healthcare Providers: BankingDealers.co.za  This test is not yet approved or  cleared by the [place] FDA and has been authorized for detection and/or diagnosis of SARS-CoV-2 by FDA under an Emergency Use Authorization (EUA).  This EUA will remain in effect (meaning this test can be used) for the duration of the COVID-19 declaration under Section 564(b)(1) of the Act, 21 U.S.C. section 360bbb-3(b)(1), unless the authorization is terminated or revoked sooner.  Performed at [facility], [place] [address], [place], [place] [address]   Protime-INR     Status: Abnormal   Collection Time: 01/06/20  5:22 PM  Result Value Ref Range   Prothrombin Time 34.4 (H) 11.4 - 15.2 seconds   INR 3.6 (H) 0.8 - 1.2    Comment: (NOTE) INR goal varies based on device and disease states. Performed at [facility], [place] [address], [place], [place] [address]   Prepare fresh frozen plasma     Status: None (Preliminary result)   Collection Time: 01/06/20  6:29 PM  Result Value Ref Range   Unit Number [id number]    Blood Component Type THAWED PLASMA    Unit division 00    Status of Unit ISSUED    Transfusion Status      OK TO TRANSFUSE Performed at [facility] [address], [place], [place] [address]   CBG monitoring, ED     Status: Abnormal   Collection Time: 01/06/20  6:57 PM  Result Value Ref Range   Glucose-Capillary 174 (H) 70 - 99 mg/dL    Comment: Glucose reference range applies only to samples taken after fasting for at least 8 hours.   Comment 1 Notify RN    Comment 2 Document in Chart   Hemoglobin and hematocrit, blood     Status: Abnormal   Collection Time: 01/06/20  7:04 PM  Result Value Ref Range   Hemoglobin 8.7 (L) 12.0 - 15.0 g/dL    Comment: REPEATED TO VERIFY DELTA CHECK NOTED    HCT 28.2 (L) 36 - 46 %    Comment: Performed at [facility] [address], [place], [place] [address]  Prepare fresh frozen plasma     Status: None (Preliminary result)   Collection Time: 01/06/20  8:30 PM  Result Value Ref Range   Unit Number [id number]    Blood Component Type THW PLS APHR    Unit division B0    Status of Unit ALLOCATED    Transfusion Status OK TO TRANSFUSE     CT RENAL STONE STUDY  Result Date: 01/06/2020 CLINICAL DATA:  Left flank pain EXAM: CT ABDOMEN AND PELVIS WITHOUT CONTRAST TECHNIQUE: Multidetector CT imaging of the abdomen and pelvis was performed following the standard protocol without IV contrast. COMPARISON:  01/06/2019 FINDINGS: Lower chest: Areas of linear consolidation at the lung bases likely reflect atelectasis or scarring. Postsurgical changes are seen from aortic and mitral valve replacements. Hepatobiliary: No focal liver abnormality is seen. No gallstones, gallbladder wall thickening, or biliary dilatation. Pancreas: The head and body of the pancreas are unremarkable. The tail the pancreas is difficult to visualize given an adjacent process within the left retroperitoneum. No definite  intraparenchymal abnormalities. Spleen: Diffuse splenic calcifications unchanged. Adrenals/Urinary Tract: Right kidney is markedly atrophic with numerous small renal cortical cyst. The left kidney is obscured by a large heterogeneous process in the left renal fossa measuring 8.7 x 6.7 cm in transverse dimension, and measuring 6.7 cm in craniocaudal length. Differential includes renal hemorrhage or hemorrhagic renal neoplasm. Evaluation is limited without IV contrast. Fat stranding extends in the retroperitoneum inferior to the left kidney consistent with retroperitoneal hemorrhage. Stomach/Bowel: No bowel obstruction or ileus. Scattered diverticulosis of the sigmoid colon without diverticulitis. Vascular/Lymphatic: Extensive vascular calcifications are seen throughout the aorta and its branches. No pathologic adenopathy. Reproductive: Calcified  uterine fibroids are noted. No adnexal masses. Other: No free fluid or free gas.  No abdominal wall hernia. Musculoskeletal: Bones are diffusely sclerotic consistent with renal osteodystrophy and history of end-stage renal disease. No acute or destructive bony lesions. IMPRESSION: 1. Large left-sided retroperitoneal hematoma centered in the left renal fossa. Differential includes spontaneous perirenal hemorrhage versus hemorrhagic renal mass. 2. Diverticulosis without diverticulitis. 3.  Aortic Atherosclerosis (ICD10-I70.0). These results were called by telephone at the time of interpretation on 01/06/2020 at 4:39pm to provider DR. Ralene Bathe, who verbally acknowledged these results. Electronically Signed   By: Randa Ngo M.D.   On: 01/06/2020 16:47    ROS  PE Blood pressure (!) 87/53, pulse (!) 115, temperature (!) 97.4 F (36.3 C), temperature source Rectal, resp. rate 20, height 5\' 6"  (1.676 m), weight 61.7 kg, last menstrual period 12/05/2010, SpO2 100 %. Constitutional: NAD; conversant; no deformities Eyes: Moist conjunctiva; no lid lag; anicteric; PERRL Neck: Trachea midline; no thyromegaly Lungs: Normal respiratory effort; no tactile fremitus CV: RRR; no palpable thrills; no pitting edema GI: Abd soft, some left tenderness no guarding; no palpable hepatosplenomegaly MSK: unable to assessgait; no clubbing/cyanosis Psychiatric: Appropriate affect; alert and oriented x3 Lymphatic: No palpable cervical or axillary lymphadenopathy   Assessment/Plan: 59 yo female with ESRD, s/p aortic and mitral valve replacement on coumadin has retroperitoneal bleed and hypotension concerning for hemorrhagic shock. -urgent reversal of coumadin with Kcentra -2 units blood for symptomatic anemia -agree with admission to ICU  Arta Bruce Nataline Basara 01/06/2020, 8:08 PM

## 2020-01-06 NOTE — ED Notes (Signed)
Timeout completed, procedure started

## 2020-01-06 NOTE — Procedures (Signed)
Central Venous Catheter Insertion Procedure Note  [name]  [id number]  02-15-1961  Date:01/06/20  Time:9:49 PM   Provider Performing:Romie Keeble Viona Gilmore [place]   Procedure: Insertion of Non-tunneled Central Venous [phone]) with US guidance ([id number])   Indication(s) Difficult access, Rapid blood transfusion  Consent Risks of the procedure as well as the alternatives and risks of each were explained to the patient and/or caregiver.  Consent for the procedure was obtained and is signed in the bedside chart  Anesthesia Topical only with 1% lidocaine   Timeout Verified patient identification, verified procedure, site/side was marked, verified correct patient position, special equipment/implants available, medications/allergies/relevant history reviewed, required imaging and test results available.  Sterile Technique Maximal sterile technique including full sterile barrier drape, hand hygiene, sterile gown, sterile gloves, mask, hair covering, sterile ultrasound probe cover (if used).  Procedure Description Area of catheter insertion was cleaned with chlorhexidine and draped in sterile fashion.  With real-time ultrasound guidance a central venous catheter was placed into the left internal jugular vein. Nonpulsatile blood flow and easy flushing noted in all ports.  The catheter was sutured in place and sterile dressing applied.  Complications/Tolerance None; patient tolerated the procedure well. Chest X-ray is ordered to verify placement for internal jugular or subclavian cannulation.   Chest x-ray is not ordered for femoral cannulation.  EBL Minimal  Specimen(s) None       Georgann Housekeeper, AGACNP-BC [place] for personal pager PCCM on call pager [phone]  01/06/2020 9:49 PM

## 2020-01-06 NOTE — ED Notes (Addendum)
Dr. Ralene Bathe at bedside with admitting Both made aware of pts blood pressure and PT INR

## 2020-01-06 NOTE — H&P (Signed)
NAME:  [name], MRN:  [id number], DOB:  1960-08-19, LOS: 0 ADMISSION DATE:  01/06/2020, CONSULTATION DATE:  01/06/20 REFERRING MD:  Quintella Reichert, MD CHIEF COMPLAINT:  Retroperitoneal hemorrhage  Brief History   Ms. [name] is a 59 year old female s/p AVR and MVR on warfarin who presented with nausea, non-bloody emesis and abdominal pain and found with spontaneous left retroperitoneal bleed.  History of present illness   59 year old female with ESRD, s/p aortic and mitral valve replacement, chronic anticoagulation who presents with abdominal pain, nausea and non-bloody emesis and found with retroperitoneal bleed. She reports a sudden onset of left-sided flank and back pain that woke her up five hours before presenting to the ED. Pain was initially sharp but now dull and persistent.She had non-bloody bowel movements x 3 and two episodes of dark but non-bloody emesis before and while in the ED. CT Stone demonstrated left retroperitoneal hematoma. She denies any recent trauma. Easily bruises since she started anticoagulation but no episodes of hematemesis, hemoptysis, hematochezia or hematuria. General Surgery was consulted.  In the ED, she was initially hypertensive but now is tachycardia with low SBPs in the 80s with MAPS >65. She reports dizziness, fatigue, nausea, and unchanged left back pain. INR elevated 3.6. She reports dizziness and feeling hot. Labs demonstrate Hg drop from 11.6 to 8.7 (11h interval). Currently receiving FFPx1. No RBCs yet. PCCM evaluated and will admit to ICU. Ordered PRBC x 1 and additional FFP. Gen Surg ordered vitamin K and KCentra however not yet given.  Past Medical History  ESRD, aortic and mitral valve stenosis s/p aortic and mitral valve replacement in 11/2018, chronic anticoagulation, chronic diastolic heart failure, stroke, lupus, chronic anemia, peptic ulcer disease  Significant Hospital Events   8/18 Admit to ICU  Consults:  PCCM Gen  Surg  Procedures:    Significant Diagnostic Tests:  CT Stone 8/18>Left-sided retroperitoneal hemorrhage in the renal fossa  Micro Data:    Antimicrobials:     Interim history/subjective:    Objective   Blood pressure (!) 87/53, pulse (!) 115, temperature (!) 97.4 F (36.3 C), temperature source Rectal, resp. rate 20, height 5\' 6"  (1.676 m), weight 61.7 kg, last menstrual period 12/05/2010, SpO2 100 %.        Intake/Output Summary (Last 24 hours) at 01/06/2020 1937 Last data filed at 01/06/2020 1003 Gross per 24 hour  Intake --  Output 200 ml  Net -200 ml   Filed Weights   01/06/20 0906  Weight: 61.7 kg   Physical Exam: General: Thin, chronically ill-appearing, pale and cool to touch, no acute distress HENT: [place], AT, OP clear, MMM, pale mucosa Eyes: EOMI, no scleral icterus Respiratory: Clear to auscultation bilaterally.  No crackles, wheezing or rales Cardiovascular: RRR, mechanical valve sounds, -M/R/G, no JVD GI: BS+, soft, nontender, left flank and back pain Extremities:RUE AVF +bruit, -edema,-tenderness Neuro: AAO x4, CNII-XII grossly intact Skin: Intact, no rashes or bruising Psych: Normal mood, normal affect  Resolved Hospital Problem list     Assessment & Plan:   Hemorrhagic shock: Currently SBP >90. Low threshold for MTP and Kcentra  Acute blood loss anemia secondary to spontaneous left retroperitoneal bleed: in the setting of supra-therapeutic INR. Hg 11.6>8.7. IR consulted in ED however patient non a candidate for intervention --Hold warfarin --STAT PRBC x 1, FFP x 2 --STAT Vitamin K --Hold KCentra in setting of dual heart valve unless patient condition deteriorates  --Trend CBC q6h --Trend INR  Aortic and  mitral stenosis s/p AVR and MVR  --Hold warfarin in setting of life-threatening bleed --Cardiology aware of plan and following  ESRD Last HD 8/16. Electrolytes acceptable --Defer dialysis until HDS  Best practice:  Diet:  NPO Pain/Anxiety/Delirium protocol (if indicated): -- VAP protocol (if indicated): -- DVT prophylaxis: Hold anticoagulation GI prophylaxis: PPI Glucose control: BMP  Mobility: BR Code Status: Full Family Communication: Updated patient and family at bedside Disposition: Admit to ICU  Labs   CBC: Recent Labs  Lab 01/06/20 0911 01/06/20 1904  WBC 8.7  --   HGB 11.6* 8.7*  HCT 37.5 28.2*  MCV 102.2*  --   PLT 257  --     Basic Metabolic Panel: Recent Labs  Lab 01/06/20 0911  NA 136  K 3.9  CL 91*  CO2 27  GLUCOSE 118*  BUN 63*  CREATININE 9.54*  CALCIUM 9.3   GFR: Estimated Creatinine Clearance: 6.1 mL/min (A) (by C-G formula based on SCr of 9.54 mg/dL (H)). Recent Labs  Lab 01/06/20 0911  WBC 8.7    Liver Function Tests: Recent Labs  Lab 01/06/20 0911  AST 28  ALT 19  ALKPHOS 110  BILITOT 0.5  PROT 8.4*  ALBUMIN 3.5   Recent Labs  Lab 01/06/20 0911  LIPASE 64*   No results for input(s): AMMONIA in the last 168 hours.  ABG    Component Value Date/Time   PHART 7.264 (L) 12/02/2018 1829   PCO2ART 56.3 (H) 12/02/2018 1829   PO2ART 86.0 12/02/2018 1829   HCO3 25.8 12/02/2018 1829   TCO2 28 12/02/2018 1829   ACIDBASEDEF 2.0 12/02/2018 1829   O2SAT 95.0 12/02/2018 1829     Coagulation Profile: Recent Labs  Lab 01/06/20 1722  INR 3.6*    Cardiac Enzymes: No results for input(s): CKTOTAL, CKMB, CKMBINDEX, TROPONINI in the last 168 hours.  HbA1C: Hgb A1c MFr Bld  Date/Time Value Ref Range Status  11/27/2018 12:25 PM 4.9 4.8 - 5.6 % Final    Comment:    (NOTE)         Prediabetes: 5.7 - 6.4         Diabetes: >6.4         Glycemic control for adults with diabetes: <7.0     CBG: Recent Labs  Lab 01/06/20 1857  GLUCAP 174*    Review of Systems:   Review of Systems  Constitutional: Negative for chills, diaphoresis, fever, malaise/fatigue and weight loss.  HENT: Negative for congestion, ear pain and sore throat.   Respiratory:  Negative for cough, hemoptysis, sputum production, shortness of breath and wheezing.   Cardiovascular: Negative for chest pain, palpitations and leg swelling.  Gastrointestinal: Positive for abdominal pain, nausea and vomiting. Negative for blood in stool, heartburn and melena.       Non bloody emesis  Genitourinary: Positive for flank pain. Negative for frequency.  Musculoskeletal: Negative for joint pain and myalgias.  Skin: Negative for itching and rash.  Neurological: Positive for dizziness and weakness. Negative for loss of consciousness and headaches.  Endo/Heme/Allergies: Bruises/bleeds easily.  Psychiatric/Behavioral: Negative for depression. The patient is not nervous/anxious.      Past Medical History  She,  has a past medical history of Anemia, Arthritis, Blood transfusion ('08), Chronic diastolic CHF (congestive heart failure) ([place]), Claustrophobia, Dysfunctional uterine bleeding (12/19/2010), ESRD (end stage renal disease) on dialysis ([place]), GERD (gastroesophageal reflux disease), Headache, Hemodialysis patient ([place]), History of hiatal hernia, cardiovascular stress test, Hypertension, Lupus ([place]), Mitral stenosis, Peptic ulcer disease, Pneumonia (  10/21/2017), S/P aortic valve replacement with metallic valve (3/55/7322), S/P mitral valve replacement with metallic valve (0/25/4270), Stroke ([place]), and Valvular heart disease (Aortic Stenosis and Mitral Stenosis) (09/25/2016).   Surgical History    Past Surgical History:  Procedure Laterality Date  . AORTIC VALVE REPLACEMENT  12/02/2018   AORTIC VALVE REPLACEMENT (AVR) USING CARBOMEDICS SUPRA-ANNULAR TOP HAT SIZE 21MM (N/A)  . AORTIC VALVE REPLACEMENT N/A 12/02/2018   Procedure: AORTIC VALVE REPLACEMENT (AVR) USING CARBOMEDICS SUPRA-ANNULAR TOP HAT SIZE 21MM;  Surgeon: Rexene Alberts, MD;  Location: [place];  Service: Open Heart Surgery;  Laterality: N/A;  . AV FISTULA PLACEMENT Right 02/25/2018   Procedure: INSERTION OF 66mm x 16cm  ARTEGRAFT;  Surgeon: Waynetta Sandy, MD;  Location: [place];  Service: Vascular;  Laterality: Right;  . COLONOSCOPY W/ BIOPSIES AND POLYPECTOMY    . DIALYSIS FISTULA CREATION  2007  . ENDOMETRIAL ABLATION    . ESOPHAGOGASTRODUODENOSCOPY N/A 07/31/2014   Procedure: ESOPHAGOGASTRODUODENOSCOPY (EGD);  Surgeon: Clarene Essex, MD;  Location: [facility] ENDOSCOPY;  Service: Endoscopy;  Laterality: N/A;  . FISTULOGRAM Right 02/25/2018   Procedure: FISTULOGRAM ARM;  Surgeon: Waynetta Sandy, MD;  Location: [place];  Service: Vascular;  Laterality: Right;  . HEMATOMA EVACUATION Right 03/06/2018   Procedure: EVACUATION HEMATOMA RIGHT UPPER ARM;  Surgeon: Waynetta Sandy, MD;  Location: [place];  Service: Vascular;  Laterality: Right;  . INSERTION OF ARTERIOVENOUS (AV) ARTEGRAFT ARM Right 09/26/2017   Procedure: INSERTION OF ARTERIOVENOUS (AV) ARTEGRAFT INTO RIGHT ARM;  Surgeon: Waynetta Sandy, MD;  Location: [place];  Service: Vascular;  Laterality: Right;  . INSERTION OF DIALYSIS CATHETER Right 03/06/2018   Procedure: INSERTION OF DIALYSIS CATHETER, right internal jugular;  Surgeon: Waynetta Sandy, MD;  Location: [place];  Service: Vascular;  Laterality: Right;  . IR THORACENTESIS ASP PLEURAL SPACE W/IMG GUIDE  01/07/2019  . MITRAL VALVE REPLACEMENT N/A 12/02/2018   Procedure: MITRAL VALVE (MV) REPLACEMENT USING CARBOMEDICS OPTIFORM SIZE 31MM;  Surgeon: Rexene Alberts, MD;  Location: [place];  Service: Open Heart Surgery;  Laterality: N/A;  . REVISON OF ARTERIOVENOUS FISTULA Right 09/26/2017   Procedure: REVISION OF ARTERIOVENOUS FISTULA RIGHT ARM WITH ARTEGRAFT;  Surgeon: Waynetta Sandy, MD;  Location: [place];  Service: Vascular;  Laterality: Right;  . REVISON OF ARTERIOVENOUS FISTULA Right 02/25/2018   Procedure: REVISION OF ARTERIOVENOUS FISTULA;  Surgeon: Waynetta Sandy, MD;  Location: [place];  Service: Vascular;  Laterality: Right;  . RIGHT/LEFT HEART CATH  AND CORONARY ANGIOGRAPHY N/A 07/24/2018   Procedure: RIGHT/LEFT HEART CATH AND CORONARY ANGIOGRAPHY;  Surgeon: Larey Dresser, MD;  Location: [place] [facility];  Service: Cardiovascular;  Laterality: N/A;  . SHOULDER OPEN ROTATOR CUFF REPAIR Right 10/09/2016   Procedure: ROTATOR CUFF REPAIR SHOULDER OPEN partial acrominectomy and extensive synovectomy;  Surgeon: Latanya [name], MD;  Location: [facility];  Service: Orthopedics;  Laterality: Right;  RNFA  . TEE WITHOUT CARDIOVERSION N/A 01/02/2018   Procedure: TRANSESOPHAGEAL ECHOCARDIOGRAM (TEE) Bubble Study;  Surgeon: Larey Dresser, MD;  Location[facility] END[facility]COPY;  Service: Cardiovascular;  Laterality: N/A;  . TEE WITHOUT CARDIOVERSION N/A 12/02/2018   Procedure: TRANSESOPHAGEAL ECHOCARDIOGRAM (TEE);  Surgeon: Rexene Alberts, MD;  Location: [place];  Service: Open Heart Surgery;  Laterality: N/A;  . TUBAL LIGATION       Social History   reports that she quit smoking about 13 months ago. Her smoking use included cigarettes. She started smoking about 2 years ago. She has  a 1.00 pack-year smoking history. She has never used smokeless tobacco. She reports current alcohol use of about 1.0 standard drink of alcohol per week. She reports previous drug use. Drugs: Marijuana and Cocaine.   Family History   Her family history includes Breast cancer in her mother; Hypertension in her father and mother; Liver cancer in her maternal grandmother; Lymphoma in her maternal aunt; Renal Disease in her father. There is no history of Heart attack.   Allergies No Known Allergies   Home Medications  Prior to Admission medications   Medication Sig Start Date End Date Taking? Authorizing Provider  amoxicillin (AMOXIL) 500 MG capsule Take 4 capsules (2,000 mg total) by mouth once as needed (1 hour prior to any dental appointment/procedure including cleaning). 07/30/19  Yes Daune Perch, NP  aspirin EC 81 MG tablet Take 81 mg by mouth at bedtime.   Yes [provider]  AURYXIA 1 GM 210 MG(Fe) tablet Take 840 mg by mouth 3 (three) times daily. 12/04/19  Yes [provider]  metoprolol tartrate (LOPRESSOR) 25 MG tablet Take 12.5 mg by mouth 2 (two) times daily. 11/09/19  Yes [provider]  multivitamin (RENA-VIT) TABS tablet Take 1 tablet by mouth at bedtime.    Yes [provider]  ondansetron (ZOFRAN) 4 MG tablet Take 4 mg by mouth every 6 (six) hours as needed for nausea or vomiting.   Yes [provider]  pantoprazole (PROTONIX) 40 MG tablet Take 40 mg by mouth 2 (two) times daily.    Yes [provider]  warfarin (COUMADIN) 3 MG tablet Take 3 tablets daily except 2 tablets on Mondays and Fridays by mouth or as directed by [facility]. Patient taking differently: Take 3 mg by mouth See admin instructions. Take 3 tablets daily except 2 tablets on Mondays by mouth or as directed by [facility]. 11/17/19  Yes Fay Records, MD  amLODipine (NORVASC) 10 MG tablet Take 1 tablet (10 mg total) by mouth at bedtime. Patient not taking: Reported on 01/06/2020 03/15/18   Roxan Hockey, MD     Critical care time: 60 min    The patient is critically ill with multiple organ systems failure and requires high complexity decision making for assessment and support, frequent evaluation and titration of therapies, application of advanced monitoring technologies and extensive interpretation of multiple databases.   Rodman Pickle, M.D. [facility] 01/06/2020 7:37 PM   Please see Amion for pager number to reach on-call Pulmonary and Critical Care Team.

## 2020-01-06 NOTE — ED Notes (Signed)
MD at bedside speaking with patient, pt in NAD

## 2020-01-06 NOTE — Consult Note (Addendum)
Cardiology Consultation:   Patient ID: [name] MRN: [id number]; DOB: 03-10-1961  Admit date: 01/06/2020 Date of Consult: 01/06/2020  Primary Care Provider: Benito Mccreedy, MD [facility] Cardiologist: Dorris Carnes, MD  [place] Electrophysiologist:  None    Patient Profile:   [name] is a 59 y.o. female with a hx of AS and MS s/p MVR and AVR in July 2020, no significant CAD on cath in 2020, moderate pulmonary hypertension, chronic diastolic CHF, CVA, HTN, ESRD, Lupus, PUD, and anemia who is being seen today for the evaluation of coumadin/valve management at the request of Dr. Ralene Bathe.  History of Present Illness:   [name] is followed by Dr. Harrington Challenger for the above cardiac issues. She underwent a cath in 2020 showing no significant CAD, moderate pulmonary hypertension, severe MS and moderate AS. She underwent MVR and AVR in July. This was complicated by B/L pleural effusions s/p L thoracentesis. She follows with the coumadin clinic. Patient was last seen 4/30/21by Dr. Harrington Challenger and had some chest wall pain but otherwise recovering well from surgery. Last echo 02/2019 showed  LVEF 65-70%, mild LVH, mildly dilated LA, no MR or MS (mean gradient 57mmHg), no AS (mean gradient 59mmHg).   The patient presented to the ER 01/06/20 with L flank pain. BP initially elevated 184/96 , pulse 108, afebrile, 100% O2. Labs showed potassium 3.9, glucose 118, lipase 64, normal AST/ALT, WBC 8.7, Hgb 11.6., BUN 63, INR 3.6, PT 34.4. EKG showed sinus with no ischemic changes. Follow-up EKG showed sinus tachycardia. In the ER patient became tachycardic with labile pressures and felt lightheaded with cold and clammy skin. CT abdomen showed large retroperitoneal hemorrhage. Hospitalist to admit. Cardiology was consulted who agreed with 1 unit FFP. Patient was admitted for further work-up.   Past Medical History:  Diagnosis Date  . Anemia   . Arthritis    "joints" (10/23/2017)  . Blood transfusion  '08   [facility]; "low HgB" (10/23/2017)  . Chronic diastolic CHF (congestive heart failure) ([place])   . Claustrophobia   . Dysfunctional uterine bleeding 12/19/2010  . ESRD (end stage renal disease) on dialysis [facility])    "MWF; [name]." (10/23/2017)  . GERD (gastroesophageal reflux disease)   . Headache   . Hemodialysis patient [facility])    right extremity port  . History of hiatal hernia   . Hx of cardiovascular stress test    Lexiscan Myoview 4/16:  Normal stress nuclear study, EF 59%  . Hypertension   . Lupus ([place])    "? kind" (10/23/2017)  . Mitral stenosis    Echo 4/16:  EF 55-60%, no RWMA, Gr 1 DD, mod MS (mean 9 mmHg), mod LAE, mild RAE, PASP 65, mod to severe TR, trivial eff // Echo 6/19:  Mild LVH, EF 55-60, no RWMA, Gr 2 DD, mild to mod AS (Mean 20), severe MS (mean 17), massive LAE, PASP 48, trivial effusion   . Peptic ulcer disease   . Pneumonia 10/21/2017  . S/P aortic valve replacement with metallic valve 2/95/2841   21 mm Sorin [employer] bileaflet mechanical valve  . S/P mitral valve replacement with metallic valve 08/11/4008   31 mm Sorin [employer] Optiform bileaflet mechanical valve  . Stroke [facility])    per patient "they said i had a small stroke but i couldnt even tell"  . Valvular heart disease (Aortic Stenosis and Mitral Stenosis) 09/25/2016   s/p mechanical AVR and MVR 11/2018 // Echo 02/2019: EF 65-70, mild LVH, mild LAE, normally  functioning mechanical mitral valve prosthesis with no regurgitation or stenosis, trivial TR, normally functioning mechanical aortic valve prosthesis without regurgitation or stenosis     Past Surgical History:  Procedure Laterality Date  . AORTIC VALVE REPLACEMENT  12/02/2018   AORTIC VALVE REPLACEMENT (AVR) USING CARBOMEDICS SUPRA-ANNULAR TOP HAT SIZE 21MM (N/A)  . AORTIC VALVE REPLACEMENT N/A 12/02/2018   Procedure: AORTIC VALVE REPLACEMENT (AVR) USING CARBOMEDICS SUPRA-ANNULAR TOP HAT SIZE 21MM;  Surgeon: Rexene Alberts, MD;  Location: [place];   Service: Open Heart Surgery;  Laterality: N/A;  . AV FISTULA PLACEMENT Right 02/25/2018   Procedure: INSERTION OF 60mm x 16cm ARTEGRAFT;  Surgeon: Waynetta Sandy, MD;  Location: [place];  Service: Vascular;  Laterality: Right;  . COLONOSCOPY W/ BIOPSIES AND POLYPECTOMY    . DIALYSIS FISTULA CREATION  2007  . ENDOMETRIAL ABLATION    . ESOPHAGOGASTRODUODENOSCOPY N/A 07/31/2014   Procedure: ESOPHAGOGASTRODUODENOSCOPY (EGD);  Surgeon: Clarene Essex, MD;  Location[facility] ENDOSCOPY;  Service: Endoscopy;  Laterality: N/A;  . FISTULOGRAM Right 02/25/2018   Procedure: FISTULOGRAM ARM;  Surgeon: Waynetta Sandy, MD;  Location: [place];  Service: Vascular;  Laterality: Right;  . HEMATOMA EVACUATION Right 03/06/2018   Procedure: EVACUATION HEMATOMA RIGHT UPPER ARM;  Surgeon: Waynetta Sandy, MD;  Location: [employer];  Service: Vascular;  Laterality: Right;  . INSERTION OF ARTERIOVENOUS (AV) ARTEGRAFT ARM Right 09/26/2017   Procedure: INSERTION OF ARTERIOVENOUS (AV) ARTEGRAFT INTO RIGHT ARM;  Surgeon: Waynetta Sandy, MD;  Location: [place];  Service: Vascular;  Laterality: Right;  . INSERTION OF DIALYSIS CATHETER Right 03/06/2018   Procedure: INSERTION OF DIALYSIS CATHETER, right internal jugular;  Surgeon: Waynetta Sandy, MD;  Location: [place];  Service: Vascular;  Laterality: Right;  . IR THORACENTESIS ASP PLEURAL SPACE W/IMG GUIDE  01/07/2019  . MITRAL VALVE REPLACEMENT N/A 12/02/2018   Procedure: MITRAL VALVE (MV) REPLACEMENT USING CARBOMEDICS OPTIFORM SIZE 31MM;  Surgeon: Rexene Alberts, MD;  Location: [place];  Service: Open Heart Surgery;  Laterality: N/A;  . REVISON OF ARTERIOVENOUS FISTULA Right 09/26/2017   Procedure: REVISION OF ARTERIOVENOUS FISTULA RIGHT ARM WITH ARTEGRAFT;  Surgeon: Waynetta Sandy, MD;  Location: [place];  Service: Vascular;  Laterality: Right;  . REVISON OF ARTERIOVENOUS FISTULA Right 02/25/2018   Procedure: REVISION OF ARTERIOVENOUS FISTULA;   Surgeon: Waynetta Sandy, MD;  Location: [place];  Service: Vascular;  Laterality: Right;  . RIGHT/LEFT HEART CATH AND CORONARY ANGIOGRAPHY N/A 07/24/2018   Procedure: RIGHT/LEFT HEART CATH AND CORONARY ANGIOGRAPHY;  Surgeon: Larey Dresser, MD;  Location: [place] CV LAB;  Service: Cardiovascular;  Laterality: N/A;  . SHOULDER OPEN ROTATOR CUFF REPAIR Right 10/09/2016   Procedure: ROTATOR CUFF REPAIR SHOULDER OPEN partial acrominectomy and extensive synovectomy;  Surgeon: Latanya [name], MD;  Location: [facility];  Service: Orthopedics;  Laterality: Right;  RNFA  . TEE WITHOUT CARDIOVERSION N/A 01/02/2018   Procedure: TRANSESOPHAGEAL ECHOCARDIOGRAM (TEE) Bubble Study;  Surgeon: Larey Dresser, MD;  Location: [facility] ENDOSCOPY;  Service: Cardiovascular;  Laterality: N/A;  . TEE WITHOUT CARDIOVERSION N/A 12/02/2018   Procedure: TRANSESOPHAGEAL ECHOCARDIOGRAM (TEE);  Surgeon: Rexene Alberts, MD;  Location: [place];  Service: Open Heart Surgery;  Laterality: N/A;  . TUBAL LIGATION       Home Medications:  Prior to Admission medications   Medication Sig Start Date End Date Taking? Authorizing Provider  amoxicillin (AMOXIL) 500 MG capsule Take 4 capsules (2,000 mg total) by mouth once as needed (1 hour  prior to any dental appointment/procedure including cleaning). 07/30/19  Yes Daune Perch, NP  aspirin EC 81 MG tablet Take 81 mg by mouth at bedtime.   Yes [provider]  AURYXIA 1 GM 210 MG(Fe) tablet Take 840 mg by mouth 3 (three) times daily. 12/04/19  Yes [provider]  metoprolol tartrate (LOPRESSOR) 25 MG tablet Take 12.5 mg by mouth 2 (two) times daily. 11/09/19  Yes [provider]  multivitamin (RENA-VIT) TABS tablet Take 1 tablet by mouth at bedtime.    Yes [provider]  ondansetron (ZOFRAN) 4 MG tablet Take 4 mg by mouth every 6 (six) hours as needed for nausea or vomiting.   Yes [provider]  pantoprazole (PROTONIX) 40 MG tablet  Take 40 mg by mouth 2 (two) times daily.    Yes [provider]  warfarin (COUMADIN) 3 MG tablet Take 3 tablets daily except 2 tablets on Mondays and Fridays by mouth or as directed by Coumadin Clinic. Patient taking differently: Take 3 mg by mouth See admin instructions. Take 3 tablets daily except 2 tablets on Mondays by mouth or as directed by Coumadin Clinic. 11/17/19  Yes Fay Records, MD  amLODipine (NORVASC) 10 MG tablet Take 1 tablet (10 mg total) by mouth at bedtime. Patient not taking: Reported on 01/06/2020 03/15/18   Roxan Hockey, MD    Inpatient Medications: Scheduled Meds: . sodium chloride   Intravenous Once   Continuous Infusions: . sodium chloride     PRN Meds: oxyCODONE-acetaminophen  Allergies:   No Known Allergies  Social History:   Social History   Socioeconomic History  . Marital status: Single    Spouse name: Not on file  . Number of children: Not on file  . Years of education: Not on file  . Highest education level: Not on file  Occupational History  . Not on file  Tobacco Use  . Smoking status: Former Smoker    Packs/day: 0.10    Years: 10.00    Pack years: 1.00    Types: Cigarettes    Start date: 10/21/2017    Quit date: 11/20/2018    Years since quitting: 1.1  . Smokeless tobacco: Never Used  Vaping Use  . Vaping Use: Never used  Substance and Sexual Activity  . Alcohol use: Yes    Alcohol/week: 1.0 standard drink    Types: 1 Cans of beer per week    Comment: occasional beer  . Drug use: Not Currently    Types: Marijuana, Cocaine    Comment: marijuana 11/27/2018  . Sexual activity: Not Currently  Other Topics Concern  . Not on file  Social History Narrative  . Not on file   Social Determinants of Health   Financial Resource Strain:   . Difficulty of Paying Living Expenses:   Food Insecurity:   . Worried About Charity fundraiser in the Last Year:   . Arboriculturist in the Last Year:   Transportation Needs:   . Lexicographer (Medical):   [name] Lack of Transportation (Non-Medical):   Physical Activity:   . Days of Exercise per Week:   . Minutes of Exercise per Session:   Stress:   . Feeling of Stress :   Social Connections:   . Frequency of Communication with Friends and Family:   . Frequency of Social Gatherings with Friends and Family:   . Attends Religious Services:   . Active Member of Clubs or Organizations:   .  Attends Archivist Meetings:   [name] Marital Status:   Intimate Partner Violence:   . Fear of Current or Ex-Partner:   . Emotionally Abused:   [name] Physically Abused:   . Sexually Abused:     Family History:   Family History  Problem Relation Age of Onset  . Liver cancer Maternal Grandmother   . Lymphoma Maternal Aunt   . Hypertension Mother   . Breast cancer Mother   . Renal Disease Father   . Hypertension Father   . Heart attack Neg Hx      ROS:  Please see the history of present illness.  All other ROS reviewed and negative.     Physical Exam/Data:   Vitals:   01/06/20 1733 01/06/20 1738 01/06/20 1806 01/06/20 1810  BP:   130/78 113/69  Pulse:      Resp:  (!) 28 20 16   Temp: (!) 97.4 F (36.3 C)     TempSrc: Oral     SpO2:      Weight:      Height:        Intake/Output Summary (Last 24 hours) at 01/06/2020 1843 Last data filed at 01/06/2020 1003 Gross per 24 hour  Intake --  Output 200 ml  Net -200 ml   Last 3 Weights 01/06/2020 09/18/2019 07/02/2019  Weight (lbs) 136 lb 137 lb 12.8 oz 137 lb 3.2 oz  Weight (kg) 61.689 kg 62.506 kg 62.234 kg     Body mass index is 21.95 kg/m.  General:  Well nourished, well developed, in no acute distress HEENT: normal Lymph: no adenopathy Neck: no JVD Endocrine:  No thryomegaly Vascular: No carotid bruits; FA pulses 2+ bilaterally without bruits  Cardiac:  normal S1, S2; RRR; no murmur  Lungs:  clear to auscultation bilaterally, no wheezing, rhonchi or rales  Abd: soft, nontender, no hepatomegaly  Ext: no  edema Musculoskeletal:  No deformities, BUE and BLE strength normal and equal Skin: warm and dry  Neuro:  CNs 2-12 intact, no focal abnormalities noted Psych:  Normal affect   EKG:  The EKG was personally reviewed and demonstrates:  NSR, 99bpm, nonspecific ST changes, QTc 434ms Telemetry:  Telemetry was personally reviewed and demonstrates:  N/A  Relevant CV Studies:  Echo 02/2019 1. Left ventricular ejection fraction, by visual estimation, is 65 to  70%. The left ventricle has normal function. Normal left ventricular size.  There is mildly increased left ventricular hypertrophy.  2. Global right ventricle has normal systolic function.The right  ventricular size is normal. No increase in right ventricular wall  thickness.  3. Left atrial size was mildly dilated.  4. Right atrial size was normal.  5. The mitral valve has been repaired/replaced. No evidence of mitral  valve regurgitation. No evidence of mitral stenosis.  6. Sorin [employer] Optiform bileaflet mechanical valve (size 31 mm, cat  # [id number], serial # [id number]).   Mean gradient 40mmHg.  7. The tricuspid valve is normal in structure. Tricuspid valve  regurgitation is trivial.  8. Aortic valve regurgitation was not visualized by color flow Doppler.  Structurally normal aortic valve, with no evidence of sclerosis or  stenosis.  9. Mechanical prosthesis in the aortic valve position.  10. Sorin [employer] Top-Hat bileaflet mechanical valve (size 21 mm, cat  # [id number], serial # [id number]).   Peak velocity: 2.46m/s, mean gradient 66mmHg.  11. The pulmonic valve was normal in structure. Pulmonic valve  regurgitation is not visualized by color flow Doppler.  12. Normal  pulmonary artery systolic pressure.  13. The inferior vena cava is normal in size with greater than 50%  respiratory variability, suggesting right atrial pressure of 3 mmHg. \  Cardiac cath 07/2018 1. No significant CAD.  2. Mildly elevated  PCWP.  3. Preserved cardiac output.  4. 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  5. Severe mitral stenosis.  6. Moderate aortic stenosis.    Carotid US 11/27/2018 Bilateral ICA 1-39  Laboratory Data:  High Sensitivity Troponin:  No results for input(s): TROPONINIHS in the last 720 hours.   Chemistry Recent Labs  Lab 01/06/20 0911  NA 136  K 3.9  CL 91*  CO2 27  GLUCOSE 118*  BUN 63*  CREATININE 9.54*  CALCIUM 9.3  GFRNONAA 4*  GFRAA 5*  ANIONGAP 18*    Recent Labs  Lab 01/06/20 0911  PROT 8.4*  ALBUMIN 3.5  AST 28  ALT 19  ALKPHOS 110  BILITOT 0.5   Hematology Recent Labs  Lab 01/06/20 0911  WBC 8.7  RBC 3.67*  HGB 11.6*  HCT 37.5  MCV 102.2*  MCH 31.6  MCHC 30.9  RDW 14.8  PLT 257   BNPNo results for input(s): BNP, PROBNP in the last 168 hours.  DDimer No results for input(s): DDIMER in the last 168 hours.   Radiology/Studies:  CT RENAL STONE STUDY  Result Date: 01/06/2020 CLINICAL DATA:  Left flank pain EXAM: CT ABDOMEN AND PELVIS WITHOUT CONTRAST TECHNIQUE: Multidetector CT imaging of the abdomen and pelvis was performed following the standard protocol without IV contrast. COMPARISON:  01/06/2019 FINDINGS: Lower chest: Areas of linear consolidation at the lung bases likely reflect atelectasis or scarring. Postsurgical changes are seen from aortic and mitral valve replacements. Hepatobiliary: No focal liver abnormality is seen. No gallstones, gallbladder wall thickening, or biliary dilatation. Pancreas: The head and body of the pancreas are unremarkable. The tail the pancreas is difficult to visualize given an adjacent process within the left retroperitoneum. No definite intraparenchymal abnormalities. Spleen: Diffuse splenic calcifications unchanged. Adrenals/Urinary Tract: Right kidney is markedly atrophic with numerous small renal cortical cyst. The left kidney is obscured by a large heterogeneous process in the left renal fossa  measuring 8.7 x 6.7 cm in transverse dimension, and measuring 6.7 cm in craniocaudal length. Differential includes renal hemorrhage or hemorrhagic renal neoplasm. Evaluation is limited without IV contrast. Fat stranding extends in the retroperitoneum inferior to the left kidney consistent with retroperitoneal hemorrhage. Stomach/Bowel: No bowel obstruction or ileus. Scattered diverticulosis of the sigmoid colon without diverticulitis. Vascular/Lymphatic: Extensive vascular calcifications are seen throughout the aorta and its branches. No pathologic adenopathy. Reproductive: Calcified uterine fibroids are noted. No adnexal masses. Other: No free fluid or free gas.  No abdominal wall hernia. Musculoskeletal: Bones are diffusely sclerotic consistent with renal osteodystrophy and history of end-stage renal disease. No acute or destructive bony lesions. IMPRESSION: 1. Large left-sided retroperitoneal hematoma centered in the left renal fossa. Differential includes spontaneous perirenal hemorrhage versus hemorrhagic renal mass. 2. Diverticulosis without diverticulitis. 3.  Aortic Atherosclerosis (ICD10-I70.0). These results were called by telephone at the time of interpretation on 01/06/2020 at 4:39pm to provider DR. Ralene Bathe, who verbally acknowledged these results. Electronically Signed   By: Randa Ngo M.D.   On: 01/06/2020 16:47   {  Assessment and Plan:   Spontaneous RP hemorrhage - patient was on coumadin for MVR/AVR. INR 3.6 - coumadin held and administered 1 unit FFP. - IR consulted and feel pt is not candidate for intervention - CCM following  S/p MVR/AVR July 2020 -  S/P Sorin [employer] mechanical valve point, 21 mm, in the aortic position; Sorin [employer] 31 mm mechanical valve in the mitral position . Follows with coumadin clinic as OP - plan to hold coumadin and administer FFP as above. Continue to hold until above issue is resolved.  - follow INR - Last echo 02/2019 showed normal  functioning valves - MD to see  Chronic diastolic CHF - volume per HD  ESRD - MWF  HTN - BP labile since arrival - pta Lopressor 25mg  BID and amlodipine 10 mg. Can restart when patient is stable. BB also will improve tachycardia   For questions or updates, please contact [place] Please consult [email or web address] for contact info under    Signed, Miciah Shealy Ninfa Meeker, PA-C  01/06/2020 6:43 PM

## 2020-01-06 NOTE — Progress Notes (Signed)
Went to see the patient at bedside, patient became more tachycardia heart rate 130s to 150s, blood pressure fluctuates systolic or 39-5 32Y.  Patient reported feeling lightheaded, skin touches cold and clammy.  Discussed with ED physician nurse at bedside, pain from both of them is patient condition significantly deteriorated compared to earlier this afternoon. Reviewed lab with ED physician INR elevated at 3.6.  Patient on Coumadin for both mechanical aortic valve and medical mitral valve.  CT abdomen showed large retroperitoneal hemorrhage.  -Talk to IR attending, impression is the location of the bleeding not candidate for IR intervention recommend, FFP -Discussed with cardiology on-call Dr. Burt Knack, agreed with 1 unit of FFP -Discussed with nephrology, no indication for emergency HD -Discussed with on call ICU team, given the significant deterioration conditions of patient tachy and BP fluctuation,all happened within few hours likely ongoing bleeding and other complicated baseline issues, I recommend patient be admitted to ICU for close monitoring and management at least overnight until patient demonstrated H&H stability and vital signs stabilization.

## 2020-01-06 NOTE — ED Notes (Signed)
Pt had emesis episode on the lobby

## 2020-01-06 NOTE — ED Notes (Signed)
Dr. Rees at bedside  

## 2020-01-06 NOTE — ED Provider Notes (Signed)
Patient care assumed at 1500.  Pt here for left flank pain that began at 0400 with associated vomiting.  CT pending.    CT demonstrates large left retroperitoneal hematoma. Will check PT/INR and type and screen. Discussed with Dr. Kieth Brightly with general surgery, who will see the patient in consult. He recommends hospitalist admission. Hospitalist consulted for admission. Patient updated findings of studies recommendation for admission and she is in agreement with treatment plan.  CRITICAL CARE Performed by: Quintella Reichert   Total critical care time: 40 minutes  Critical care time was exclusive of separately billable procedures and treating other patients.  Critical care was necessary to treat or prevent imminent or life-threatening deterioration.  Critical care was time spent personally by me on the following activities: development of treatment plan with patient and/or surrogate as well as nursing, discussions with consultants, evaluation of patient's response to treatment, examination of patient, obtaining history from patient or surrogate, ordering and performing treatments and interventions, ordering and review of laboratory studies, ordering and review of radiographic studies, pulse oximetry and re-evaluation of patient's condition.    Quintella Reichert, MD 01/06/20 [phone]

## 2020-01-07 ENCOUNTER — Encounter (HOSPITAL_COMMUNITY): Payer: Self-pay | Admitting: Pulmonary Disease

## 2020-01-07 DIAGNOSIS — R571 Hypovolemic shock: Secondary | ICD-10-CM

## 2020-01-07 LAB — MRSA PCR SCREENING: MRSA by PCR: NEGATIVE

## 2020-01-07 LAB — PREPARE FRESH FROZEN PLASMA: Unit division: 0

## 2020-01-07 LAB — CBC
HCT: 23 % — ABNORMAL LOW (ref 36.0–46.0)
HCT: 29.2 % — ABNORMAL LOW (ref 36.0–46.0)
HCT: 26.2 % — ABNORMAL LOW (ref 36.0–46.0)
Hemoglobin: 7.5 g/dL — ABNORMAL LOW (ref 12.0–15.0)
Hemoglobin: 10 g/dL — ABNORMAL LOW (ref 12.0–15.0)
Hemoglobin: 9.1 g/dL — ABNORMAL LOW (ref 12.0–15.0)
MCH: 31 pg (ref 26.0–34.0)
MCH: 30.8 pg (ref 26.0–34.0)
MCH: 31.8 pg (ref 26.0–34.0)
MCHC: 32.6 g/dL (ref 30.0–36.0)
MCHC: 34.2 g/dL (ref 30.0–36.0)
MCHC: 34.7 g/dL (ref 30.0–36.0)
MCV: 95 fL (ref 80.0–100.0)
MCV: 89.8 fL (ref 80.0–100.0)
MCV: 91.6 fL (ref 80.0–100.0)
Platelets: 164 10*3/uL (ref 150–400)
Platelets: 144 10*3/uL — ABNORMAL LOW (ref 150–400)
Platelets: 136 10*3/uL — ABNORMAL LOW (ref 150–400)
RBC: 2.42 MIL/uL — ABNORMAL LOW (ref 3.87–5.11)
RBC: 3.25 MIL/uL — ABNORMAL LOW (ref 3.87–5.11)
RBC: 2.86 MIL/uL — ABNORMAL LOW (ref 3.87–5.11)
RDW: 17.1 % — ABNORMAL HIGH (ref 11.5–15.5)
RDW: 16.9 % — ABNORMAL HIGH (ref 11.5–15.5)
RDW: 17.2 % — ABNORMAL HIGH (ref 11.5–15.5)
WBC: 8.1 10*3/uL (ref 4.0–10.5)
WBC: 15.1 10*3/uL — ABNORMAL HIGH (ref 4.0–10.5)
WBC: 13.6 10*3/uL — ABNORMAL HIGH (ref 4.0–10.5)
nRBC: 0 % (ref 0.0–0.2)
nRBC: 0 % (ref 0.0–0.2)
nRBC: 0.1 % (ref 0.0–0.2)

## 2020-01-07 LAB — COMPREHENSIVE METABOLIC PANEL
ALT: 17 U/L (ref 0–44)
AST: 30 U/L (ref 15–41)
Albumin: 3.3 g/dL — ABNORMAL LOW (ref 3.5–5.0)
Alkaline Phosphatase: 80 U/L (ref 38–126)
Anion gap: 20 — ABNORMAL HIGH (ref 5–15)
BUN: 78 mg/dL — ABNORMAL HIGH (ref 6–20)
CO2: 26 mmol/L (ref 22–32)
Calcium: 9.2 mg/dL (ref 8.9–10.3)
Chloride: 92 mmol/L — ABNORMAL LOW (ref 98–111)
Creatinine, Ser: 10.76 mg/dL — ABNORMAL HIGH (ref 0.44–1.00)
GFR calc Af Amer: 4 mL/min — ABNORMAL LOW (ref >60)
GFR calc non Af Amer: 4 mL/min — ABNORMAL LOW (ref >60)
Glucose, Bld: 95 mg/dL (ref 70–99)
Potassium: 4.5 mmol/L (ref 3.5–5.1)
Sodium: 138 mmol/L (ref 135–145)
Total Bilirubin: 0.2 mg/dL — ABNORMAL LOW (ref 0.3–1.2)
Total Protein: 7.1 g/dL (ref 6.5–8.1)

## 2020-01-07 LAB — PHOSPHORUS: Phosphorus: 9.5 mg/dL — ABNORMAL HIGH (ref 2.5–4.6)

## 2020-01-07 LAB — BPAM FFP
Blood Product Expiration Date: 202108192359
Blood Product Expiration Date: 202108222359
ISSUE DATE / TIME: 202108181848
ISSUE DATE / TIME: 202108182350
Unit Type and Rh: 6200
Unit Type and Rh: 6200

## 2020-01-07 LAB — PROTIME-INR
INR: 1.4 — ABNORMAL HIGH (ref 0.8–1.2)
Prothrombin Time: 16.8 seconds — ABNORMAL HIGH (ref 11.4–15.2)

## 2020-01-07 LAB — PREPARE RBC (CROSSMATCH)

## 2020-01-07 LAB — MAGNESIUM: Magnesium: 2.3 mg/dL (ref 1.7–2.4)

## 2020-01-07 MED ORDER — CHLORHEXIDINE GLUCONATE CLOTH 2 % EX PADS
6.0000 | MEDICATED_PAD | Freq: Every day | CUTANEOUS | Status: DC
  Administered 2020-01-09 – 2020-01-10 (×2): 6 via TOPICAL

## 2020-01-07 MED ORDER — OXYCODONE-ACETAMINOPHEN 5-325 MG PO TABS
2.0000 | ORAL_TABLET | Freq: Four times a day (QID) | ORAL | Status: AC | PRN
  Administered 2020-01-07 (×2): 2 via ORAL
  Filled 2020-01-07 (×2): qty 2

## 2020-01-07 MED ORDER — ONDANSETRON HCL 4 MG/2ML IJ SOLN
4.0000 mg | Freq: Once | INTRAMUSCULAR | Status: AC
  Administered 2020-01-07: 4 mg via INTRAVENOUS

## 2020-01-07 MED ORDER — ONDANSETRON HCL 4 MG/2ML IJ SOLN
4.0000 mg | Freq: Four times a day (QID) | INTRAMUSCULAR | Status: DC | PRN
  Administered 2020-01-07 – 2020-01-08 (×3): 4 mg via INTRAVENOUS
  Filled 2020-01-07 (×3): qty 2

## 2020-01-07 MED ORDER — MORPHINE SULFATE (PF) 4 MG/ML IV SOLN
4.0000 mg | INTRAVENOUS | Status: DC | PRN
  Administered 2020-01-07 – 2020-01-11 (×17): 4 mg via INTRAVENOUS
  Filled 2020-01-07 (×17): qty 1

## 2020-01-07 MED ORDER — ORAL CARE MOUTH RINSE
15.0000 mL | Freq: Two times a day (BID) | OROMUCOSAL | Status: DC
  Administered 2020-01-07 – 2020-01-11 (×8): 15 mL via OROMUCOSAL

## 2020-01-07 MED ORDER — HYDROMORPHONE HCL 1 MG/ML IJ SOLN: 1.0000 mg | INTRAMUSCULAR | Status: DC | PRN

## 2020-01-07 MED ORDER — SODIUM CHLORIDE 0.9% FLUSH
10.0000 mL | Freq: Two times a day (BID) | INTRAVENOUS | Status: DC
  Administered 2020-01-07 – 2020-01-08 (×2): 10 mL
  Administered 2020-01-08: 40 mL
  Administered 2020-01-09 – 2020-01-14 (×8): 10 mL

## 2020-01-07 MED ORDER — CALCITRIOL 0.5 MCG PO CAPS
2.0000 ug | ORAL_CAPSULE | ORAL | Status: DC
  Administered 2020-01-08 – 2020-02-12 (×14): 2 ug via ORAL
  Filled 2020-01-07 (×21): qty 4

## 2020-01-07 MED ORDER — SODIUM CHLORIDE 0.9% IV SOLUTION: Freq: Once | INTRAVENOUS | Status: DC

## 2020-01-07 MED ORDER — SODIUM CHLORIDE 0.9% FLUSH: 10.0000 mL | INTRAVENOUS | Status: DC | PRN

## 2020-01-07 MED ORDER — CINACALCET HCL 30 MG PO TABS
180.0000 mg | ORAL_TABLET | ORAL | Status: DC
  Administered 2020-01-08: 180 mg via ORAL
  Filled 2020-01-07: qty 6

## 2020-01-07 NOTE — Progress Notes (Signed)
[place] Progress Note Patient Name: [name] DOB: 08/06/1960 MRN: [id number]   Date of Service  01/07/2020  HPI/Events of Note  Patient needs a PRN for nausea, her QTc yesterday was 485.  eICU Interventions  Zofran 4 mg iv Q 6 hours PRN ordered.        Kerry Kass Jermal Dismuke 01/07/2020, 9:14 PM

## 2020-01-07 NOTE — Progress Notes (Signed)
Subjective/Chief Complaint: Pain in left flank, feels constipated   Objective: Vital signs in last 24 hours: Temp:  [97.4 F (36.3 C)-97.7 F (36.5 C)] 97.7 F (36.5 C) (08/19 0800) Pulse Rate:  [101-128] 101 (08/19 0800) Resp:  [11-38] 17 (08/19 0800) BP: (87-190)/(53-105) 155/91 (08/19 0800) SpO2:  [96 %-100 %] 100 % (08/19 0800) Last BM Date:  (PTA)  Intake/Output from previous day: 08/18 0701 - 08/19 0700 In: 1236 [Blood:1236] Out: 200 [Emesis/NG output:200] Intake/Output this shift: No intake/output data recorded.  General appearance: alert and cooperative Resp: unlabored Cardio: regular rate and rhythm GI: soft, tender left lateral and left flank Skin: Skin color, texture, turgor normal. No rashes or lesions  Lab Results:  Recent Labs    01/06/20 0911 01/06/20 0911 01/06/20 1904 01/07/20 0350  WBC 8.7  --   --  8.1  HGB 11.6*   < > 8.7* 7.5*  HCT 37.5   < > 28.2* 23.0*  PLT 257  --   --  164   < > = values in this interval not displayed.   BMET Recent Labs    01/06/20 0911 01/07/20 0350  NA 136 138  K 3.9 4.5  CL 91* 92*  CO2 27 26  GLUCOSE 118* 95  BUN 63* 78*  CREATININE 9.54* 10.76*  CALCIUM 9.3 9.2   PT/INR Recent Labs    01/06/20 1722 01/07/20 0350  LABPROT 34.4* 16.8*  INR 3.6* 1.4*   ABG No results for input(s): PHART, HCO3 in the last 72 hours.  Invalid input(s): PCO2, PO2  Studies/Results: DG Chest Portable 1 View  Result Date: 01/06/2020 CLINICAL DATA:  59 year old female with central line placement. EXAM: PORTABLE CHEST 1 VIEW COMPARISON:  Chest radiograph dated 02/05/2019. FINDINGS: Left IJ central venous line tip close to the cavoatrial junction. There are minimal bibasilar atelectasis. No focal consolidation, pleural effusion, or pneumothorax. Stable cardiac silhouette. Median sternotomy wires and mechanical cardiac valve. Atherosclerotic calcification of the aorta. No acute osseous pathology. IMPRESSION: Left IJ  central venous line with tip close to the cavoatrial junction. No pneumothorax. Electronically Signed   By: Anner Crete M.D.   On: 01/06/2020 22:08   CT RENAL STONE STUDY  Result Date: 01/06/2020 CLINICAL DATA:  Left flank pain EXAM: CT ABDOMEN AND PELVIS WITHOUT CONTRAST TECHNIQUE: Multidetector CT imaging of the abdomen and pelvis was performed following the standard protocol without IV contrast. COMPARISON:  01/06/2019 FINDINGS: Lower chest: Areas of linear consolidation at the lung bases likely reflect atelectasis or scarring. Postsurgical changes are seen from aortic and mitral valve replacements. Hepatobiliary: No focal liver abnormality is seen. No gallstones, gallbladder wall thickening, or biliary dilatation. Pancreas: The head and body of the pancreas are unremarkable. The tail the pancreas is difficult to visualize given an adjacent process within the left retroperitoneum. No definite intraparenchymal abnormalities. Spleen: Diffuse splenic calcifications unchanged. Adrenals/Urinary Tract: Right kidney is markedly atrophic with numerous small renal cortical cyst. The left kidney is obscured by a large heterogeneous process in the left renal fossa measuring 8.7 x 6.7 cm in transverse dimension, and measuring 6.7 cm in craniocaudal length. Differential includes renal hemorrhage or hemorrhagic renal neoplasm. Evaluation is limited without IV contrast. Fat stranding extends in the retroperitoneum inferior to the left kidney consistent with retroperitoneal hemorrhage. Stomach/Bowel: No bowel obstruction or ileus. Scattered diverticulosis of the sigmoid colon without diverticulitis. Vascular/Lymphatic: Extensive vascular calcifications are seen throughout the aorta and its branches. No pathologic adenopathy. Reproductive: Calcified uterine fibroids are noted.  No adnexal masses. Other: No free fluid or free gas.  No abdominal wall hernia. Musculoskeletal: Bones are diffusely sclerotic consistent with  renal osteodystrophy and history of end-stage renal disease. No acute or destructive bony lesions. IMPRESSION: 1. Large left-sided retroperitoneal hematoma centered in the left renal fossa. Differential includes spontaneous perirenal hemorrhage versus hemorrhagic renal mass. 2. Diverticulosis without diverticulitis. 3.  Aortic Atherosclerosis (ICD10-I70.0). These results were called by telephone at the time of interpretation on 01/06/2020 at 4:39pm to provider DR. Ralene Bathe, who verbally acknowledged these results. Electronically Signed   By: Randa Ngo M.D.   On: 01/06/2020 16:47    Anti-infectives: Anti-infectives (From admission, onward)   None      Assessment/Plan: 59 yo female with ESRD, s/p aortic and mitral valve replacement on coumadin with spontaneous retroperitoneal hemorrhage. Hypertensive now.  Rec'd FFP and looks like 1u PRBC; hgb relatively stable now receiving 2u PRBC Anticipate this will tamponade with coagulopathy reversal If rebleeds, consider IR consultation.  No indication for surgical intervention on these. We will sign off- please call with questions  LOS: 1 day    Clovis Riley 01/07/2020

## 2020-01-07 NOTE — Progress Notes (Signed)
eLink Physician-Brief Progress Note Patient Name: [name] DOB: 24-Nov-1960 MRN: [id number]   Date of Service  01/07/2020  HPI/Events of Note  Patient admitted through the ED with a retroperitoneal hematoma, she is s/p aortic and mitral valve replacement and was on anti-coagulation.  eICU Interventions  New Patient Evaluation completed.        [name] 01/07/2020, 5:36 AM

## 2020-01-07 NOTE — Progress Notes (Addendum)
Patient vomited 300 cc of green/brown bile in last hour. Tinge of red noted on outside of emesis bag. No obvious coffee ground emesis noted at this time. Patient instructed to tell RN if she begins to see new red or coffee ground emesis. Will continue to monitor patient. Elink notified at this time for possible zofran PRN.  2200 update: Patient no longer vomiting after IV zofran and is currently resting.

## 2020-01-07 NOTE — Progress Notes (Signed)
ESRD Consult Note [facility]  Requesting provider: Margaretha Seeds, MD Reason for consult: ESRD, provision of dialysis  Outpatient dialysis unit: [facility] Outpatient dialysis schedule: MWF  Assessment/Recommendations: [name] is a/an 59 y.o. female with a past medical history notable for ESRD on HD admitted with hemorrhagic shock RP bleed with supratherapeutic INR.   ESRD: -outpatient orders: 4hrs, F180, 450/800, 2K, 2Cal, 137Na, 35Bicarb, profile #2, hep 1800 bolus -HD today and then tomorrow to maintain MWF schedule -hold heparin  Volume/ hypertension: [name] 61kg. Will attempt to UF 1 L as tolerated, overall euvolemic on exam  Left sided RP hematoma, Anemia of Chronic Kidney Disease:  -s/p FFP x2, vit k, rec'd 1u prbc now receiving 2u -no kcentra given mechanical valves unless patient deteriorates -not on iron or esa's lately at outpatient unit (hgb at goal) -mgmt per primary service, transfuse as needed.  Serial H&H  Secondary Hyperparathyroidism/Hyperphosphatemia: sensipar 180mg  on hd days, calcitriol 51mcg qtreatment   Vascular access: rue avf with +b/t  H/o AVR and MVR on coumadin -a/c on hold, cardiology on board  Additional recommendations: - Dose all meds for creatinine clearance < 10 ml/min  - Unless absolutely necessary, no MRIs with gadolinium.  - Implement save arm precautions.  Prefer needle sticks in the dorsum of the hands or wrists.  No blood pressure measurements in arm. - If blood transfusion is requested during hemodialysis sessions, please alert [place] prior to the session.  - If a hemodialysis catheter line culture is requested, please alert [place] as only hemodialysis nurses are able to collect those specimens.   Recommendations were discussed with the primary team.   Connie Quint, MD [facility]  History of Present Illness: [name] is a/an 59 y.o. female with a past medical history of ESRD on HD, history lupus,  history of aortic and mitral valve replacement on Coumadin, diastolic CHF, history of subarachnoid hemorrhage, GERD, hypertension, pud who presents with abdominal pain, nausea and vomiting.  She was found to have a RP bleed.  The onset of her pain was abrupt and occurred about 5 hours prior to admission. she did not do dialysis yesterday. INR was 3.6 on presentation to receive FFP as well as PRBC and vitamin K.  Kcentra on hold given mechanical valves. Anuric at baseline.  Outpatient hemoglobins have been within goal previously. Still report excruciating left-sided abdominal pain and nausea vomiting.  Otherwise denies any chest pain, shortness of breath, issues with her AV fistula.   Medications:  Current Facility-Administered Medications  Medication Dose Route Frequency Provider Last Rate Last Admin   0.9 %  sodium chloride infusion (Manually program via Guardrails IV Fluids)   Intravenous Once Wynetta Fines T, MD       0.9 %  sodium chloride infusion (Manually program via Guardrails IV Fluids)   Intravenous Once Corey Harold, NP       0.9 %  sodium chloride infusion (Manually program via Guardrails IV Fluids)   Intravenous Once Kinsinger, Arta Bruce, MD       0.9 %  sodium chloride infusion (Manually program via Guardrails IV Fluids)   Intravenous Once Margaretha Seeds, MD       0.9 %  sodium chloride infusion (Manually program via Guardrails IV Fluids)   Intravenous Once Margaretha Seeds, MD       Chlorhexidine Gluconate Cloth 2 % PADS 6 each  6 each Topical Daily Kinsinger, Arta Bruce, MD  MEDLINE mouth rinse  15 mL Mouth Rinse BID Kinsinger, Arta Bruce, MD   15 mL at 01/07/20 0900   morphine 4 MG/ML injection 4 mg  4 mg Intravenous Q4H PRN Harvie Heck, MD   4 mg at 01/07/20 0855   oxyCODONE-acetaminophen (PERCOCET/ROXICET) 5-325 MG per tablet 2 tablet  2 tablet Oral Q6H PRN Aslam, Loralyn Freshwater, MD       pantoprazole (PROTONIX) injection 40 mg  40 mg Intravenous Daily Margaretha Seeds, MD   40 mg at 01/07/20 0857   sodium chloride 0.9 % bolus 250 mL  250 mL Intravenous Once Wynetta Fines T, MD       sodium chloride flush (NS) 0.9 % injection 10-40 mL  10-40 mL Intracatheter Q12H Margaretha Seeds, MD   10 mL at 01/07/20 0900   sodium chloride flush (NS) 0.9 % injection 10-40 mL  10-40 mL Intracatheter PRN Margaretha Seeds, MD         ALLERGIES Patient has no known allergies.  MEDICAL HISTORY Past Medical History:  Diagnosis Date   Anemia    Arthritis    "joints" (10/23/2017)   Blood transfusion '08   [facility]; "low HgB" (10/23/2017)   Chronic diastolic CHF (congestive heart failure) ([place])    Claustrophobia    Dysfunctional uterine bleeding 12/19/2010   ESRD (end stage renal disease) on dialysis ([place])    "MWF; [name]." (10/23/2017)   GERD (gastroesophageal reflux disease)    Headache    Hemodialysis patient ([place])    right extremity port   History of hiatal hernia    Hx of cardiovascular stress test    Lexiscan Myoview 4/16:  Normal stress nuclear study, EF 59%   Hypertension    Lupus ([place])    "? kind" (10/23/2017)   Mitral stenosis    Echo 4/16:  EF 55-60%, no RWMA, Gr 1 DD, mod MS (mean 9 mmHg), mod LAE, mild RAE, PASP 65, mod to severe TR, trivial eff // Echo 6/19:  Mild LVH, EF 55-60, no RWMA, Gr 2 DD, mild to mod AS (Mean 20), severe MS (mean 17), massive LAE, PASP 48, trivial effusion    Peptic ulcer disease    Pneumonia 10/21/2017   S/P aortic valve replacement with metallic valve 1/96/2229   21 mm Sorin Carbomedics bileaflet mechanical valve   S/P mitral valve replacement with metallic valve 7/98/9211   31 mm Sorin Carbomedics Optiform bileaflet mechanical valve   Stroke ([place])    per patient "they said i had a small stroke but i couldnt even tell"   Valvular heart disease (Aortic Stenosis and Mitral Stenosis) 09/25/2016   s/p mechanical AVR and MVR 11/2018 // Echo 02/2019: EF 65-70, mild LVH, mild LAE, normally functioning  mechanical mitral valve prosthesis with no regurgitation or stenosis, trivial TR, normally functioning mechanical aortic valve prosthesis without regurgitation or stenosis      SOCIAL HISTORY Social History   Socioeconomic History   Marital status: Single    Spouse name: Not on file   Number of children: Not on file   Years of education: Not on file   Highest education level: Not on file  Occupational History   Not on file  Tobacco Use   Smoking status: Former Smoker    Packs/day: 0.10    Years: 10.00    Pack years: 1.00    Types: Cigarettes    Start date: 10/21/2017    Quit date: 11/20/2018    Years since quitting: 1.1  Smokeless tobacco: Never Used  Vaping Use   Vaping Use: Never used  Substance and Sexual Activity   Alcohol use: Yes    Alcohol/week: 1.0 standard drink    Types: 1 Cans of beer per week    Comment: occasional beer   Drug use: Not Currently    Types: Marijuana, Cocaine    Comment: marijuana 11/27/2018   Sexual activity: Not Currently  Other Topics Concern   Not on file  Social History Narrative   Not on file   Social Determinants of Health   Financial Resource Strain:    Difficulty of Paying Living Expenses: Not on file  Food Insecurity:    Worried About Running Out of Food in the Last Year: Not on file   [employer] of Food in the Last Year: Not on file  Transportation Needs:    Lack of Transportation (Medical): Not on file   Lack of Transportation (Non-Medical): Not on file  Physical Activity:    Days of Exercise per Week: Not on file   Minutes of Exercise per Session: Not on file  Stress:    Feeling of Stress : Not on file  Social Connections:    Frequency of Communication with Friends and Family: Not on file   Frequency of Social Gatherings with Friends and Family: Not on file   Attends Religious Services: Not on file   Active Member of Clubs or Organizations: Not on file   Attends Archivist Meetings: Not on  file   Marital Status: Not on file  Intimate Partner Violence:    Fear of Current or Ex-Partner: Not on file   Emotionally Abused: Not on file   Physically Abused: Not on file   Sexually Abused: Not on file     FAMILY HISTORY Family History  Problem Relation Age of Onset   Liver cancer Maternal Grandmother    Lymphoma Maternal Aunt    Hypertension Mother    Breast cancer Mother    Renal Disease Father    Hypertension Father    Heart attack Neg Hx      Review of Systems: 12 systems were reviewed and negative except per HPI  Physical Exam: Vitals:   01/07/20 0900 01/07/20 0930  BP: (!) 174/89 (!) 162/96  Pulse: (!) 115 (!) 105  Resp: (!) 29 (!) 23  Temp:  97.7 F (36.5 C)  SpO2: 99% 99%   No intake/output data recorded.  Intake/Output Summary (Last 24 hours) at 01/07/2020 0935 Last data filed at 01/07/2020 0515 Gross per 24 hour  Intake 1236 ml  Output 200 ml  Net 1036 ml   General: anxious appearing, in minimal distress from pain HEENT: anicteric sclera, MMM CV: normal rate, no murmurs, no edema Lungs: bilateral chest rise, normal wob, cta bl Abd: left sided tenderness Skin: no visible lesions or rashes Psych: alert, engaged, appropriate mood and affect Neuro: normal speech, no gross focal deficits  Access: RUE avf with +b/t  Test Results Reviewed Lab Results  Component Value Date   NA 138 01/07/2020   K 4.5 01/07/2020   CL 92 (L) 01/07/2020   CO2 26 01/07/2020   BUN 78 (H) 01/07/2020   CREATININE 10.76 (H) 01/07/2020   CALCIUM 9.2 01/07/2020   ALBUMIN 3.3 (L) 01/07/2020   PHOS 9.5 (H) 01/07/2020    I have reviewed relevant outside healthcare records

## 2020-01-07 NOTE — Progress Notes (Signed)
Cardiology Progress Note  Patient ID: [name] MRN: [id number] DOB: Feb 12, 1961 Date of Encounter: 01/07/2020  Primary Cardiologist: Dorris Carnes, MD  Subjective   Chief Complaint: L side pain  HPI: Receiving transfusion this AM.   ROS:  All other ROS reviewed and negative. Pertinent positives noted in the HPI.     Inpatient Medications  Scheduled Meds: . sodium chloride   Intravenous Once  . sodium chloride   Intravenous Once  . sodium chloride   Intravenous Once  . sodium chloride   Intravenous Once  . sodium chloride   Intravenous Once  . [START ON 01/08/2020] calcitRIOL  2 mcg Oral Q M,W,F  . Chlorhexidine Gluconate Cloth  6 each Topical Daily  . [START ON 01/08/2020] cinacalcet  180 mg Oral Q M,W,F  . mouth rinse  15 mL Mouth Rinse BID  . pantoprazole (PROTONIX) IV  40 mg Intravenous Daily  . sodium chloride flush  10-40 mL Intracatheter Q12H   Continuous Infusions: . sodium chloride     PRN Meds: morphine injection, oxyCODONE-acetaminophen, sodium chloride flush   Vital Signs   Vitals:   01/07/20 0935 01/07/20 0940 01/07/20 0955 01/07/20 1000  BP:  (!) 162/96 (!) 170/85 (!) 170/93  Pulse:  (!) 103 (!) 111 (!) 110  Resp:  (!) 23 19 18   Temp: 97.7 F (36.5 C) 97.7 F (36.5 C) 97.9 F (36.6 C)   TempSrc:  Axillary Oral   SpO2:  100% 100% 100%  Weight:      Height:        Intake/Output Summary (Last 24 hours) at 01/07/2020 1048 Last data filed at 01/07/2020 0935 Gross per 24 hour  Intake 1551 ml  Output --  Net 1551 ml   Last 3 Weights 01/06/2020 09/18/2019 07/02/2019  Weight (lbs) 136 lb 137 lb 12.8 oz 137 lb 3.2 oz  Weight (kg) 61.689 kg 62.506 kg 62.234 kg      Telemetry  Overnight telemetry shows sinus tachycardia, which I personally reviewed.   ECG  The most recent ECG shows sinus tachycardia, which I personally reviewed.   Physical Exam   Vitals:   01/07/20 0935 01/07/20 0940 01/07/20 0955 01/07/20 1000  BP:  (!) 162/96 (!) 170/85 (!)  170/93  Pulse:  (!) 103 (!) 111 (!) 110  Resp:  (!) 23 19 18   Temp: 97.7 F (36.5 C) 97.7 F (36.5 C) 97.9 F (36.6 C)   TempSrc:  Axillary Oral   SpO2:  100% 100% 100%  Weight:      Height:         Intake/Output Summary (Last 24 hours) at 01/07/2020 1048 Last data filed at 01/07/2020 0935 Gross per 24 hour  Intake 1551 ml  Output --  Net 1551 ml    Last 3 Weights 01/06/2020 09/18/2019 07/02/2019  Weight (lbs) 136 lb 137 lb 12.8 oz 137 lb 3.2 oz  Weight (kg) 61.689 kg 62.506 kg 62.234 kg    Body mass index is 21.95 kg/m.   General: Well nourished, well developed, in no acute distress Head: Atraumatic, normal size  Eyes: PEERLA, EOMI  Neck: Supple, no JVD Endocrine: No thryomegaly Cardiac: Normal S1, S2; RRR; 2/6 SEM Lungs: Clear to auscultation bilaterally, no wheezing, rhonchi or rales  Abd: Soft, nontender, no hepatomegaly  Ext: No edema, pulses 2+ Musculoskeletal: No deformities, BUE and BLE strength normal and equal Skin: Warm and dry, no rashes   Neuro: Alert and oriented to person, place, time, and situation, CNII-XII grossly  intact, no focal deficits  Psych: Normal mood and affect   Labs  High Sensitivity Troponin:  No results for input(s): TROPONINIHS in the last 720 hours.   Cardiac EnzymesNo results for input(s): TROPONINI in the last 168 hours. No results for input(s): TROPIPOC in the last 168 hours.  Chemistry Recent Labs  Lab 01/06/20 0911 01/07/20 0350  NA 136 138  K 3.9 4.5  CL 91* 92*  CO2 27 26  GLUCOSE 118* 95  BUN 63* 78*  CREATININE 9.54* 10.76*  CALCIUM 9.3 9.2  PROT 8.4* 7.1  ALBUMIN 3.5 3.3*  AST 28 30  ALT 19 17  ALKPHOS 110 80  BILITOT 0.5 0.2*  GFRNONAA 4* 4*  GFRAA 5* 4*  ANIONGAP 18* 20*    Hematology Recent Labs  Lab 01/06/20 0911 01/06/20 1904 01/07/20 0350  WBC 8.7  --  8.1  RBC 3.67*  --  2.42*  HGB 11.6* 8.7* 7.5*  HCT 37.5 28.2* 23.0*  MCV 102.2*  --  95.0  MCH 31.6  --  31.0  MCHC 30.9  --  32.6  RDW 14.8  --   17.1*  PLT 257  --  164   BNPNo results for input(s): BNP, PROBNP in the last 168 hours.  DDimer No results for input(s): DDIMER in the last 168 hours.   Radiology  DG Chest Portable 1 View  Result Date: 01/06/2020 CLINICAL DATA:  59 year old female with central line placement. EXAM: PORTABLE CHEST 1 VIEW COMPARISON:  Chest radiograph dated 02/05/2019. FINDINGS: Left IJ central venous line tip close to the cavoatrial junction. There are minimal bibasilar atelectasis. No focal consolidation, pleural effusion, or pneumothorax. Stable cardiac silhouette. Median sternotomy wires and mechanical cardiac valve. Atherosclerotic calcification of the aorta. No acute osseous pathology. IMPRESSION: Left IJ central venous line with tip close to the cavoatrial junction. No pneumothorax. Electronically Signed   By: Anner Crete M.D.   On: 01/06/2020 22:08   CT RENAL STONE STUDY  Result Date: 01/06/2020 CLINICAL DATA:  Left flank pain EXAM: CT ABDOMEN AND PELVIS WITHOUT CONTRAST TECHNIQUE: Multidetector CT imaging of the abdomen and pelvis was performed following the standard protocol without IV contrast. COMPARISON:  01/06/2019 FINDINGS: Lower chest: Areas of linear consolidation at the lung bases likely reflect atelectasis or scarring. Postsurgical changes are seen from aortic and mitral valve replacements. Hepatobiliary: No focal liver abnormality is seen. No gallstones, gallbladder wall thickening, or biliary dilatation. Pancreas: The head and body of the pancreas are unremarkable. The tail the pancreas is difficult to visualize given an adjacent process within the left retroperitoneum. No definite intraparenchymal abnormalities. Spleen: Diffuse splenic calcifications unchanged. Adrenals/Urinary Tract: Right kidney is markedly atrophic with numerous small renal cortical cyst. The left kidney is obscured by a large heterogeneous process in the left renal fossa measuring 8.7 x 6.7 cm in transverse dimension, and  measuring 6.7 cm in craniocaudal length. Differential includes renal hemorrhage or hemorrhagic renal neoplasm. Evaluation is limited without IV contrast. Fat stranding extends in the retroperitoneum inferior to the left kidney consistent with retroperitoneal hemorrhage. Stomach/Bowel: No bowel obstruction or ileus. Scattered diverticulosis of the sigmoid colon without diverticulitis. Vascular/Lymphatic: Extensive vascular calcifications are seen throughout the aorta and its branches. No pathologic adenopathy. Reproductive: Calcified uterine fibroids are noted. No adnexal masses. Other: No free fluid or free gas.  No abdominal wall hernia. Musculoskeletal: Bones are diffusely sclerotic consistent with renal osteodystrophy and history of end-stage renal disease. No acute or destructive bony lesions. IMPRESSION: 1. Large  left-sided retroperitoneal hematoma centered in the left renal fossa. Differential includes spontaneous perirenal hemorrhage versus hemorrhagic renal mass. 2. Diverticulosis without diverticulitis. 3.  Aortic Atherosclerosis (ICD10-I70.0). These results were called by telephone at the time of interpretation on 01/06/2020 at 4:39pm to provider DR. Ralene Bathe, who verbally acknowledged these results. Electronically Signed   By: Randa Ngo M.D.   On: 01/06/2020 16:47    Cardiac Studies  TTE 03/12/2019  1. Left ventricular ejection fraction, by visual estimation, is 65 to  70%. The left ventricle has normal function. Normal left ventricular size.  There is mildly increased left ventricular hypertrophy.  2. Global right ventricle has normal systolic function.The right  ventricular size is normal. No increase in right ventricular wall  thickness.  3. Left atrial size was mildly dilated.  4. Right atrial size was normal.  5. The mitral valve has been repaired/replaced. No evidence of mitral  valve regurgitation. No evidence of mitral stenosis.  6. [employer] Optiform bileaflet  mechanical valve (size 31 mm, cat  # [id number], serial # [id number]).   Mean gradient 73mmHg.  7. The tricuspid valve is normal in structure. Tricuspid valve  regurgitation is trivial.  8. Aortic valve regurgitation was not visualized by color flow Doppler.  Structurally normal aortic valve, with no evidence of sclerosis or  stenosis.  9. Mechanical prosthesis in the aortic valve position.  10. [employer] Top-Hat bileaflet mechanical valve (size 21 mm, cat  # [id number], serial # [id number]).   Peak velocity: 2.35m/s, mean gradient 84mmHg.  11. The pulmonic valve was normal in structure. Pulmonic valve  regurgitation is not visualized by color flow Doppler.  12. Normal pulmonary artery systolic pressure.  13. The inferior vena cava is normal in size with greater than 50%  respiratory variability, suggesting right atrial pressure of 3 mmHg.   LHC 07/24/2018 -normal coronaries   Patient Profile  [name] is a 59 y.o. female with ESRD, rheumatic AS/MS s/p mechanical AVR/MVR, HTN, lupus admitted 01/06/2020 with spontaneous RP bleed.   Assessment & Plan   1. Acute spontaneous RP bleed -Hgb not stable. Receiving 1 unit pRBC this AM.  -hold AC for now -will restart heparin/warfarin once bleed has resolved. Unfortunately, we will have to hold.   2. Mechanical AVR/MVR -mechanical AVR/MVR 11/2018 -2/6 SEM expected in setting of anemia and valve replacement -I hear crisp S1/S2 -holding AC in setting of RP bleed -will re-initiate as able. Clearly, now is now the time.   For questions or updates, please contact [place] Please consult [email or web address] for contact info under   Time Spent with Patient: I have spent a total of 25 minutes with patient reviewing hospital notes, telemetry, EKGs, labs and examining the patient as well as establishing an assessment and plan that was discussed with the patient.  > 50% of time was spent in direct patient care.    Signed, Addison Naegeli.  Audie Box, [place]  01/07/2020 10:48 AM

## 2020-01-07 NOTE — Progress Notes (Signed)
HD tx terminated with 30 minutes left per pt request-signed AMA. Reports abdominal pain and back pain throughout tx with little relief despite interventions provided by primary nurse. UF only 344ml net due to significant drop in BP after 45 minutes of initiation of tx with pt c/o feeling bad, restless, clammy and feeling "warm and sick". SBP dropped from 180s to low 100's, pt maintained in low 100s throughout tx, with drop in BP with increase in UF goals. Pt stable upon completion.

## 2020-01-07 NOTE — Progress Notes (Signed)
NAME:  [name], MRN:  [id number], DOB:  [birth year], LOS: 1 ADMISSION DATE:  01/06/2020, CONSULTATION DATE:  01/06/20 REFERRING MD:  Quintella Reichert, MD CHIEF COMPLAINT:  Retroperitoneal hemorrhage  Brief History   Ms. [name] is a 59 year old female s/p AVR and MVR on warfarin who presented with nausea, non-bloody emesis and abdominal pain and found with spontaneous left retroperitoneal bleed.  History of present illness   59 year old female with ESRD, s/p aortic and mitral valve replacement, chronic anticoagulation who presents with abdominal pain, nausea and non-bloody emesis and found with retroperitoneal bleed. She reports a sudden onset of left-sided flank and back pain that woke her up five hours before presenting to the ED. Pain was initially sharp but now dull and persistent.She had non-bloody bowel movements x 3 and two episodes of dark but non-bloody emesis before and while in the ED. CT Stone demonstrated left retroperitoneal hematoma. She denies any recent trauma. Easily bruises since she started anticoagulation but no episodes of hematemesis, hemoptysis, hematochezia or hematuria. General Surgery was consulted.  In the ED, she was initially hypertensive but now is tachycardia with low SBPs in the 80s with MAPS >65. She reports dizziness, fatigue, nausea, and unchanged left back pain. INR elevated 3.6. She reports dizziness and feeling hot. Labs demonstrate Hg drop from 11.6 to 8.7 (11h interval). Currently receiving FFPx1. No RBCs yet. PCCM evaluated and will admit to ICU. Ordered PRBC x 1 and additional FFP. Gen Surg ordered vitamin K and KCentra however not yet given.  Past Medical History  ESRD, aortic and mitral valve stenosis s/p aortic and mitral valve replacement in 11/2018, chronic anticoagulation, chronic diastolic heart failure, stroke, lupus, chronic anemia, peptic ulcer disease  Significant Hospital Events   8/18 Admit to ICU  Consults:  PCCM Gen  Surg  Procedures:    Significant Diagnostic Tests:  CT Stone 8/18>Left-sided retroperitoneal hemorrhage in the renal fossa  Micro Data:    Antimicrobials:     Interim history/subjective:  Recevied 1 unit FFP and 1 unit of PRBCs yesterday and is receiving a 2nd unit of PRBCs currently. She is complain of left sided pain. She reports the morphine has alleviated her pain the most. Denies other complaints at this time other than pain.  Objective   Blood pressure (!) 170/93, pulse (!) 110, temperature 97.9 F (36.6 C), temperature source Oral, resp. rate 18, height 5\' 6"  (1.676 m), weight 61.7 kg, last menstrual period 12/05/2010, SpO2 100 %.        Intake/Output Summary (Last 24 hours) at 01/07/2020 1050 Last data filed at 01/07/2020 0935 Gross per 24 hour  Intake 1551 ml  Output --  Net 1551 ml   Filed Weights   01/06/20 0906  Weight: 61.7 kg   Physical Exam: General: Thin, chronically ill-appearing, mild distress HENT: [place], AT, OP clear, MMM, dry mucosa Eyes: EOMI, no scleral icterus Respiratory: Clear to auscultation bilaterally.  No crackles, wheezing or rales Cardiovascular: RRR, mechanical valve sounds, -M/R/G, no JVD GI: BS+, soft, nontender, left flank and back pain Extremities:RUE AVF +bruit, -edema,-tenderness Neuro: AAO x4, CNII-XII grossly intact Skin: Intact, no rashes or bruising Psych: Normal mood, normal affect  Resolved Hospital Problem list     Assessment & Plan:   Hemorrhagic shock, improved  Acute blood loss anemia secondary to spontaneous left retroperitoneal bleed: in the setting of supra-therapeutic INR. Hg 11.6>8.7. IR consulted in ED however patient non a candidate for intervention. Surgery following and no  acute intervention planned given her clinical improvement. --Hold warfarin  --Trend CBC q6h --Trend INR  Aortic and mitral stenosis s/p AVR and MVR  --Hold warfarin in setting of life-threatening bleed --Cardiology aware of plan and  following  ESRD Last HD 8/16. Electrolytes acceptable - Renal following with plans to perform HD today and again tomorrow to maintain her MWF schedule  Pain Management - Morphine 4mg  IV q4hrs prn - Oxycodone 10mg  PO q6hrs prn  Best practice:  Diet: clears Pain/Anxiety/Delirium protocol (if indicated): -- VAP protocol (if indicated): -- DVT prophylaxis: Hold anticoagulation GI prophylaxis: PPI Glucose control: BMP  Mobility: BR Code Status: Full Family Communication: Discussed plan with patient Disposition: ICU  Labs   CBC: Recent Labs  Lab 01/06/20 0911 01/06/20 1904 01/07/20 0350  WBC 8.7  --  8.1  HGB 11.6* 8.7* 7.5*  HCT 37.5 28.2* 23.0*  MCV 102.2*  --  95.0  PLT 257  --  287    Basic Metabolic Panel: Recent Labs  Lab 01/06/20 0911 01/07/20 0350  NA 136 138  K 3.9 4.5  CL 91* 92*  CO2 27 26  GLUCOSE 118* 95  BUN 63* 78*  CREATININE 9.54* 10.76*  CALCIUM 9.3 9.2  MG  --  2.3  PHOS  --  9.5*   GFR: Estimated Creatinine Clearance: 5.4 mL/min (A) (by C-G formula based on SCr of 10.76 mg/dL (H)). Recent Labs  Lab 01/06/20 0911 01/07/20 0350  WBC 8.7 8.1    Liver Function Tests: Recent Labs  Lab 01/06/20 0911 01/07/20 0350  AST 28 30  ALT 19 17  ALKPHOS 110 80  BILITOT 0.5 0.2*  PROT 8.4* 7.1  ALBUMIN 3.5 3.3*   Recent Labs  Lab 01/06/20 0911  LIPASE 64*   No results for input(s): AMMONIA in the last 168 hours.  ABG    Component Value Date/Time   PHART 7.264 (L) 12/02/2018 1829   PCO2ART 56.3 (H) 12/02/2018 1829   PO2ART 86.0 12/02/2018 1829   HCO3 25.8 12/02/2018 1829   TCO2 28 12/02/2018 1829   ACIDBASEDEF 2.0 12/02/2018 1829   O2SAT 95.0 12/02/2018 1829     Coagulation Profile: Recent Labs  Lab 01/06/20 1722 01/07/20 0350  INR 3.6* 1.4*    Cardiac Enzymes: No results for input(s): CKTOTAL, CKMB, CKMBINDEX, TROPONINI in the last 168 hours.  HbA1C: Hgb A1c MFr Bld  Date/Time Value Ref Range Status  11/27/2018  12:25 PM 4.9 4.8 - 5.6 % Final    Comment:    (NOTE)         Prediabetes: 5.7 - 6.4         Diabetes: >6.4         Glycemic control for adults with diabetes: <7.0     CBG: Recent Labs  Lab 01/06/20 1857  GLUCAP 174*    Care time: 61 min     Freda Jackson, MD [facility] Office: [phone]   See Amion for Pager Details

## 2020-01-07 NOTE — Plan of Care (Signed)

## 2020-01-08 DIAGNOSIS — R109 Unspecified abdominal pain: Secondary | ICD-10-CM

## 2020-01-08 DIAGNOSIS — Z952 Presence of prosthetic heart valve: Secondary | ICD-10-CM

## 2020-01-08 LAB — CBC WITH DIFFERENTIAL/PLATELET
Abs Immature Granulocytes: 0.12 10*3/uL — ABNORMAL HIGH (ref 0.00–0.07)
Basophils Absolute: 0 10*3/uL (ref 0.0–0.1)
Basophils Relative: 0 %
Eosinophils Absolute: 0 10*3/uL (ref 0.0–0.5)
Eosinophils Relative: 0 %
HCT: 23.1 % — ABNORMAL LOW (ref 36.0–46.0)
Hemoglobin: 7.8 g/dL — ABNORMAL LOW (ref 12.0–15.0)
Immature Granulocytes: 1 %
Lymphocytes Relative: 3 %
Lymphs Abs: 0.5 10*3/uL — ABNORMAL LOW (ref 0.7–4.0)
MCH: 31.5 pg (ref 26.0–34.0)
MCHC: 33.8 g/dL (ref 30.0–36.0)
MCV: 93.1 fL (ref 80.0–100.0)
Monocytes Absolute: 1.2 10*3/uL — ABNORMAL HIGH (ref 0.1–1.0)
Monocytes Relative: 7 %
Neutro Abs: 16.3 10*3/uL — ABNORMAL HIGH (ref 1.7–7.7)
Neutrophils Relative %: 89 %
Platelets: 123 10*3/uL — ABNORMAL LOW (ref 150–400)
RBC: 2.48 MIL/uL — ABNORMAL LOW (ref 3.87–5.11)
RDW: 17.3 % — ABNORMAL HIGH (ref 11.5–15.5)
WBC: 18.1 10*3/uL — ABNORMAL HIGH (ref 4.0–10.5)
nRBC: 0 % (ref 0.0–0.2)

## 2020-01-08 LAB — RENAL FUNCTION PANEL
Albumin: 3 g/dL — ABNORMAL LOW (ref 3.5–5.0)
Anion gap: 13 (ref 5–15)
BUN: 30 mg/dL — ABNORMAL HIGH (ref 6–20)
CO2: 28 mmol/L (ref 22–32)
Calcium: 9.2 mg/dL (ref 8.9–10.3)
Chloride: 93 mmol/L — ABNORMAL LOW (ref 98–111)
Creatinine, Ser: 5.9 mg/dL — ABNORMAL HIGH (ref 0.44–1.00)
GFR calc Af Amer: 8 mL/min — ABNORMAL LOW (ref >60)
GFR calc non Af Amer: 7 mL/min — ABNORMAL LOW (ref >60)
Glucose, Bld: 94 mg/dL (ref 70–99)
Phosphorus: 8.1 mg/dL — ABNORMAL HIGH (ref 2.5–4.6)
Potassium: 3.8 mmol/L (ref 3.5–5.1)
Sodium: 134 mmol/L — ABNORMAL LOW (ref 135–145)

## 2020-01-08 LAB — CBC
HCT: 23.9 % — ABNORMAL LOW (ref 36.0–46.0)
HCT: 23.8 % — ABNORMAL LOW (ref 36.0–46.0)
Hemoglobin: 8.2 g/dL — ABNORMAL LOW (ref 12.0–15.0)
Hemoglobin: 8 g/dL — ABNORMAL LOW (ref 12.0–15.0)
MCH: 31.8 pg (ref 26.0–34.0)
MCH: 31.5 pg (ref 26.0–34.0)
MCHC: 34.3 g/dL (ref 30.0–36.0)
MCHC: 33.6 g/dL (ref 30.0–36.0)
MCV: 92.6 fL (ref 80.0–100.0)
MCV: 93.7 fL (ref 80.0–100.0)
Platelets: 129 10*3/uL — ABNORMAL LOW (ref 150–400)
Platelets: 129 10*3/uL — ABNORMAL LOW (ref 150–400)
RBC: 2.58 MIL/uL — ABNORMAL LOW (ref 3.87–5.11)
RBC: 2.54 MIL/uL — ABNORMAL LOW (ref 3.87–5.11)
RDW: 17.5 % — ABNORMAL HIGH (ref 11.5–15.5)
RDW: 17.7 % — ABNORMAL HIGH (ref 11.5–15.5)
WBC: 13.4 10*3/uL — ABNORMAL HIGH (ref 4.0–10.5)
WBC: 16.6 10*3/uL — ABNORMAL HIGH (ref 4.0–10.5)
nRBC: 0.1 % (ref 0.0–0.2)
nRBC: 0.1 % (ref 0.0–0.2)

## 2020-01-08 LAB — PROTIME-INR
INR: 1.2 (ref 0.8–1.2)
Prothrombin Time: 14.5 seconds (ref 11.4–15.2)

## 2020-01-08 MED ORDER — ONDANSETRON 4 MG PO TBDP
4.0000 mg | ORAL_TABLET | Freq: Three times a day (TID) | ORAL | Status: DC | PRN
  Administered 2020-01-08: 4 mg via ORAL
  Filled 2020-01-08: qty 1

## 2020-01-08 NOTE — Progress Notes (Signed)
[facility] Progress Note    Assessment/ Plan:   1. Acute blood loss anemia, left sided RP hematoma. S/p ffp, prbc, and vit k -anticoagulation on hold -serial h/h, transfuse prn 2. H/p AVR and MVR on chronic anticoagulation (coumadin). A/c on hold, cardio on board 3. ESRD, s/p Hd yesterday, HD today to maintain MWF schedule. Limiting UF, no heparin 4. Secondary hyperparathyroidism. Limit phos in diet, sensipar and calcitriol (phos 8.1) 5. HTN, home meds on hold 6. Nausea/vomiting. Mgmt per primary service. Antiemetics while watch qtc  Outpatient dialysis orders: 4hrs, F180, edw 61kg, 450/800, 2k, 2cal, 137na, 35bicarb, uf profile #2, hep 1800 bolus -sensipar 180mg  -calcitriol 52mcg -no esa, no iron  Connie Quint, MD [facility] 01/08/2020, 10:31 AM    Subjective:   Still nauseated, bilious vomiting. Had vomiting on hd yesterday. Pain slightly better. uf'ed 962mL yesterday   Objective:   BP (!) 159/86   Pulse (!) 126   Temp 98.8 F (37.1 C) (Oral)   Resp 13   Ht 5\' 6"  (1.676 m)   Wt 67.1 kg   LMP 12/05/2010 (LMP Unknown)   SpO2 (!) 89%   BMI 23.88 kg/m   Intake/Output Summary (Last 24 hours) at 01/08/2020 1031 Last data filed at 01/08/2020 1020 Gross per 24 hour  Intake 110 ml  Output 611 ml  Net -501 ml   Weight change: 5.411 kg  Physical Exam: Gen:ill appearing CVS:s1s2 +murmur Resp:cta bl, no w/r/r/c, unlabored, bl chest expansion XBW:IOMBTD left quadrants Ext:no edema Neuro: awake, alert, speech clear and coherent Access: rue avf with +b/t  Imaging: DG Chest Portable 1 View  Result Date: 01/06/2020 CLINICAL DATA:  59 year old female with central line placement. EXAM: PORTABLE CHEST 1 VIEW COMPARISON:  Chest radiograph dated 02/05/2019. FINDINGS: Left IJ central venous line tip close to the cavoatrial junction. There are minimal bibasilar atelectasis. No focal consolidation, pleural effusion, or pneumothorax. Stable cardiac  silhouette. Median sternotomy wires and mechanical cardiac valve. Atherosclerotic calcification of the aorta. No acute osseous pathology. IMPRESSION: Left IJ central venous line with tip close to the cavoatrial junction. No pneumothorax. Electronically Signed   By: Anner Crete M.D.   On: 01/06/2020 22:08   CT RENAL STONE STUDY  Result Date: 01/06/2020 CLINICAL DATA:  Left flank pain EXAM: CT ABDOMEN AND PELVIS WITHOUT CONTRAST TECHNIQUE: Multidetector CT imaging of the abdomen and pelvis was performed following the standard protocol without IV contrast. COMPARISON:  01/06/2019 FINDINGS: Lower chest: Areas of linear consolidation at the lung bases likely reflect atelectasis or scarring. Postsurgical changes are seen from aortic and mitral valve replacements. Hepatobiliary: No focal liver abnormality is seen. No gallstones, gallbladder wall thickening, or biliary dilatation. Pancreas: The head and body of the pancreas are unremarkable. The tail the pancreas is difficult to visualize given an adjacent process within the left retroperitoneum. No definite intraparenchymal abnormalities. Spleen: Diffuse splenic calcifications unchanged. Adrenals/Urinary Tract: Right kidney is markedly atrophic with numerous small renal cortical cyst. The left kidney is obscured by a large heterogeneous process in the left renal fossa measuring 8.7 x 6.7 cm in transverse dimension, and measuring 6.7 cm in craniocaudal length. Differential includes renal hemorrhage or hemorrhagic renal neoplasm. Evaluation is limited without IV contrast. Fat stranding extends in the retroperitoneum inferior to the left kidney consistent with retroperitoneal hemorrhage. Stomach/Bowel: No bowel obstruction or ileus. Scattered diverticulosis of the sigmoid colon without diverticulitis. Vascular/Lymphatic: Extensive vascular calcifications are seen throughout the aorta and its branches. No pathologic adenopathy. Reproductive: Calcified  uterine fibroids  are noted. No adnexal masses. Other: No free fluid or free gas.  No abdominal wall hernia. Musculoskeletal: Bones are diffusely sclerotic consistent with renal osteodystrophy and history of end-stage renal disease. No acute or destructive bony lesions. IMPRESSION: 1. Large left-sided retroperitoneal hematoma centered in the left renal fossa. Differential includes spontaneous perirenal hemorrhage versus hemorrhagic renal mass. 2. Diverticulosis without diverticulitis. 3.  Aortic Atherosclerosis (ICD10-I70.0). These results were called by telephone at the time of interpretation on 01/06/2020 at 4:39pm to provider DR. Ralene Bathe, who verbally acknowledged these results. Electronically Signed   By: Randa Ngo M.D.   On: 01/06/2020 16:47    Labs: BMET Recent Labs  Lab 01/06/20 0911 01/07/20 0350 01/08/20 0500  NA 136 138 134*  K 3.9 4.5 3.8  CL 91* 92* 93*  CO2 27 26 28   GLUCOSE 118* 95 94  BUN 63* 78* 30*  CREATININE 9.54* 10.76* 5.90*  CALCIUM 9.3 9.2 9.2  PHOS  --  9.5* 8.1*   CBC Recent Labs  Lab 01/07/20 1615 01/07/20 1949 01/08/20 0200 01/08/20 0908  WBC 15.1* 13.6* 13.4* 16.6*  HGB 10.0* 9.1* 8.2* 8.0*  HCT 29.2* 26.2* 23.9* 23.8*  MCV 89.8 91.6 92.6 93.7  PLT 144* 136* 129* 129*    Medications:    . sodium chloride   Intravenous Once  . sodium chloride   Intravenous Once  . sodium chloride   Intravenous Once  . sodium chloride   Intravenous Once  . sodium chloride   Intravenous Once  . calcitRIOL  2 mcg Oral Q M,W,F  . Chlorhexidine Gluconate Cloth  6 each Topical Daily  . mouth rinse  15 mL Mouth Rinse BID  . pantoprazole (PROTONIX) IV  40 mg Intravenous Daily  . sodium chloride flush  10-40 mL Intracatheter Q12H

## 2020-01-08 NOTE — Progress Notes (Addendum)
NAME:  [name], MRN:  [id number], DOB:  Dec 17, 1960, LOS: 2 ADMISSION DATE:  01/06/2020, CONSULTATION DATE:  01/06/2020 REFERRING MD:  Quintella Reichert, MD CHIEF COMPLAINT:  Retroperitoneal hemorrhage   Brief History   Patient is a 59 year old female with history of ESRD on HD MWF, s/p aortic and mitral valve replacement on warfarin therapy who presented with nausea, nonbloody emesis and abdominal pain; found to have spontaneous left retroperitoneal bleed.  She has received 1u pRBC and 2u FFP and vitamin K.  Past Medical History   Past Medical History:  Diagnosis Date  . Anemia   . Arthritis    "joints" (10/23/2017)  . Blood transfusion '08   [facility]; "low HgB" (10/23/2017)  . Chronic diastolic CHF (congestive heart failure) ([place])   . Claustrophobia   . Dysfunctional uterine bleeding 12/19/2010  . ESRD (end stage renal disease) on dialysis [facility])    "MWF; [name]." (10/23/2017)  . GERD (gastroesophageal reflux disease)   . Headache   . Hemodialysis patient [facility])    right extremity port  . History of hiatal hernia   . Hx of cardiovascular stress test    Lexiscan Myoview 4/16:  Normal stress nuclear study, EF 59%  . Hypertension   . Lupus ([place])    "? kind" (10/23/2017)  . Mitral stenosis    Echo 4/16:  EF 55-60%, no RWMA, Gr 1 DD, mod MS (mean 9 mmHg), mod LAE, mild RAE, PASP 65, mod to severe TR, trivial eff // Echo 6/19:  Mild LVH, EF 55-60, no RWMA, Gr 2 DD, mild to mod AS (Mean 20), severe MS (mean 17), massive LAE, PASP 48, trivial effusion   . Peptic ulcer disease   . Pneumonia 10/21/2017  . S/P aortic valve replacement with metallic valve 5/97/4163   21 mm Sorin [employer] bileaflet mechanical valve  . S/P mitral valve replacement with metallic valve 8/45/3646   31 mm Sorin [employer] Optiform bileaflet mechanical valve  . Stroke [facility])    per patient "they said i had a small stroke but i couldnt even tell"  . Valvular heart disease (Aortic Stenosis and Mitral Stenosis)  09/25/2016   s/p mechanical AVR and MVR 11/2018 // Echo 02/2019: EF 65-70, mild LVH, mild LAE, normally functioning mechanical mitral valve prosthesis with no regurgitation or stenosis, trivial TR, normally functioning mechanical aortic valve prosthesis without regurgitation or stenosis    Significant Hospital Events   8/18 > admitted to ICU  Consults:  [place] [facility] Nephrology Cardiology  Procedures:    Significant Diagnostic Tests:  CT Renal Stone 8/18 > large left sided retroperitoneal hematoma centered in the left renal fossa; diverticulosis without diverticulitis   Micro Data:  SARS CoV2 > Negative MRSA > Negative   Antimicrobials:  None   Interim history/subjective:  Overnight, episode of bilious emesis for which she received zofran. This morning, she reports feeling better. Nausea and pain are improved. No acute concerns at this time.   Objective   Blood pressure 128/66, pulse (!) 114, temperature 98.4 F (36.9 C), temperature source Oral, resp. rate 14, height 5\' 6"  (1.676 m), weight 67.1 kg, last menstrual period 12/05/2010, SpO2 100 %.        Intake/Output Summary (Last 24 hours) at 01/08/2020 0754 Last data filed at 01/07/2020 2100 Gross per 24 hour  Intake 415 ml  Output 611 ml  Net -196 ml   Filed Weights   01/06/20 0906 01/07/20 1300  Weight: 61.7 kg 67.1 kg  Examination: General: chronically ill appearing female in no acute distress at this time  HENT: /AT, anicteric sclerae, moist mucous membranes, EOMI Lungs: clear to auscultation bilaterally without wheezing, rales or rhonchi Cardiovascular: tachycardic with mechanical valve sounds; no m/r/g Abdomen: nondistended, soft, nontender to palpation; normoactive bowel sounds  Extremities: RUE AVF with bruit; warm and dry, no edema or tenderness noted Neuro: awake, alert oriented x4, no apparent focal deficits noted Skin: No rashes or lesions noted   Resolved Hospital Problem list     Assessment & Plan:  Acute blood loss anemia 2/2 spontaneous left retroperitoneal bleed:  In setting of supratherapeutic INR. Patient without IR intervention. She received 2u pRBC, platelets and vitamin K for INR reversal. INR currently 1.2. Hb 10>8 over past 16 hours and patient remains tachycardic but hypertensive.  - Continue to hold warfarin - Trending CBC q6h, transfuse as needed for Hb <7 - Trend INR   Aortic and mitral stenosis s/p AVR and MVR - Currently holding warfarin  - Cardiology consulted, appreciate their recommendations   ESRD  Patient receives HD on MWF. Had HD session yesterday with UF 311 due to significant drop in BP after 45 minutes of initiation of HD.  - Continue HD per nephrology recommendations  - Monitor electrolytes and renal function - Avoid nephrotoxic agents   QTc prolongation - zofran odt for nausea  - monitor serial EKGs   Pain management - Currently well controlled with morphine and percocet   Best practice:  Diet: renal diet  Pain/Anxiety/Delirium protocol (if indicated): morphine and oxycodone for pain  VAP protocol (if indicated): n/a DVT prophylaxis: SCDs GI prophylaxis: PPI Glucose control: N/a Mobility: Bed rest Code Status: FULL Family Communication: patient updated at bedside  Disposition: ICU  Critical care time: 35 minutes     Harvie Heck, MD Internal Medicine, PGY-2 01/08/20 11:39 AM Pager # [phone]     Pulmonary critical care attending:  This is a 59 year old female past medical history ofEnd-stage renal disease p, history of aortic and mitral valve replacement on Coumadin.  Patient was found to have an elevated INR.  She came to the emergency department presented with nausea, nonbloody emesis and abdominal pain.  She was found to have a spontaneous left-sided retroperitoneal hematoma.  She was given blood products and vitamin K for reversal.  Her hemoglobin is stable.  She remains tachycardic.  Her pain is better  controlled this morning.  Case was discussed with cardiology about potentially restarting heparin with no bolus.  BP 116/64 (BP Location: Left Arm)   Pulse (!) 117   Temp 98.8 F (37.1 C) (Oral)   Resp (!) 21   Ht 5\' 6"  (1.676 m)   Wt 65.1 kg   LMP 12/05/2010 (LMP Unknown)   SpO2 95%   BMI 23.16 kg/m   General: Female resting in bed no distress, tachycardic Heart: Regular, tachycardic, S1-S2 Lungs: Clear to auscultation bilaterally Abdomen: Mild distention and some tenderness to palpation in the left flank.  Labs: Reviewed hemoglobin stable.  Repeat hemoglobin 7.8 trended down slightly from 8.0.  White count continues to rise which is likely reactive.  Assessment: Acute blood loss anemia Hemorrhagic shock, resolved Retroperitoneal bleed Mechanical heart valve, history of aortic and mitral stenosis End-stage renal disease on dialysis.  Plan: HD per nephrology Continue to observe H&H Would like to give her at least 24 hours with a normal INR to ensure stability of clot related to retroperitoneal bleed. This is likely tamponade itself off. Can consider restart  of heparin no bolus tomorrow morning. She will need to remain in the ICU for close watch of decompensation with the initiation of anticoagulation and recent RP bili. This needs to occur due to the risk of stroke and valve failure from clot formation as she has mechanical mitral and aortic.   This patient is critically ill with multiple organ system failure; which, requires frequent high complexity decision making, assessment, support, evaluation, and titration of therapies. This was completed through the application of advanced monitoring technologies and extensive interpretation of multiple databases. During this encounter critical care time was devoted to patient care services described in this note for 32 minutes.   [facility] 01/08/2020 5:52 PM

## 2020-01-08 NOTE — Progress Notes (Signed)
Cardiology Progress Note  Patient ID: [name] MRN: [id number] DOB: 22-Mar-1961 Date of Encounter: 01/08/2020  Primary Cardiologist: Dorris Carnes, MD  Subjective   Chief Complaint: Back pain.  HPI: Hgb still dropping. Received 1 unit pRBC yesterday.   ROS:  All other ROS reviewed and negative. Pertinent positives noted in the HPI.     Inpatient Medications  Scheduled Meds: . sodium chloride   Intravenous Once  . sodium chloride   Intravenous Once  . sodium chloride   Intravenous Once  . sodium chloride   Intravenous Once  . sodium chloride   Intravenous Once  . calcitRIOL  2 mcg Oral Q M,W,F  . Chlorhexidine Gluconate Cloth  6 each Topical Daily  . mouth rinse  15 mL Mouth Rinse BID  . pantoprazole (PROTONIX) IV  40 mg Intravenous Daily  . sodium chloride flush  10-40 mL Intracatheter Q12H   Continuous Infusions: . sodium chloride     PRN Meds: morphine injection, ondansetron (ZOFRAN) IV, ondansetron, sodium chloride flush   Vital Signs   Vitals:   01/08/20 0700 01/08/20 0800 01/08/20 0900 01/08/20 1130  BP: 128/66 (!) 154/84 (!) 159/86   Pulse: (!) 114 (!) 120 (!) 126   Resp: 14 20 13    Temp:  98.8 F (37.1 C)  98.7 F (37.1 C)  TempSrc:  Oral  Oral  SpO2: 100% 100% (!) 89%   Weight:      Height:        Intake/Output Summary (Last 24 hours) at 01/08/2020 1133 Last data filed at 01/08/2020 1020 Gross per 24 hour  Intake 110 ml  Output 611 ml  Net -501 ml   Last 3 Weights 01/07/2020 01/06/2020 09/18/2019  Weight (lbs) 147 lb 14.9 oz 136 lb 137 lb 12.8 oz  Weight (kg) 67.1 kg 61.689 kg 62.506 kg      Telemetry  Overnight telemetry shows sinus tachycardia, which I personally reviewed.   ECG  The most recent ECG shows sinus tachycardia, which I personally reviewed.   Physical Exam   Vitals:   01/08/20 0700 01/08/20 0800 01/08/20 0900 01/08/20 1130  BP: 128/66 (!) 154/84 (!) 159/86   Pulse: (!) 114 (!) 120 (!) 126   Resp: 14 20 13    Temp:  98.8  F (37.1 C)  98.7 F (37.1 C)  TempSrc:  Oral  Oral  SpO2: 100% 100% (!) 89%   Weight:      Height:         Intake/Output Summary (Last 24 hours) at 01/08/2020 1133 Last data filed at 01/08/2020 1020 Gross per 24 hour  Intake 110 ml  Output 611 ml  Net -501 ml    Last 3 Weights 01/07/2020 01/06/2020 09/18/2019  Weight (lbs) 147 lb 14.9 oz 136 lb 137 lb 12.8 oz  Weight (kg) 67.1 kg 61.689 kg 62.506 kg    Body mass index is 23.88 kg/m.   General: Well nourished, well developed, in no acute distress Head: Atraumatic, normal size  Eyes: PEERLA, EOMI  Neck: Supple, no JVD Endocrine: No thryomegaly Cardiac: Normal S1, S2; tachycardia, 2/6 SEM, mechanical clicks present  Lungs: Clear to auscultation bilaterally, no wheezing, rhonchi or rales  Abd: Soft, nontender, no hepatomegaly  Ext: No edema, pulses 2+ Musculoskeletal: No deformities, BUE and BLE strength normal and equal Skin: Warm and dry, no rashes   Neuro: Alert and oriented to person, place, time, and situation, CNII-XII grossly intact, no focal deficits  Psych: Normal mood and affect  Labs  High Sensitivity Troponin:  No results for input(s): TROPONINIHS in the last 720 hours.   Cardiac EnzymesNo results for input(s): TROPONINI in the last 168 hours. No results for input(s): TROPIPOC in the last 168 hours.  Chemistry Recent Labs  Lab 01/06/20 0911 01/07/20 0350 01/08/20 0500  NA 136 138 134*  K 3.9 4.5 3.8  CL 91* 92* 93*  CO2 27 26 28   GLUCOSE 118* 95 94  BUN 63* 78* 30*  CREATININE 9.54* 10.76* 5.90*  CALCIUM 9.3 9.2 9.2  PROT 8.4* 7.1  --   ALBUMIN 3.5 3.3* 3.0*  AST 28 30  --   ALT 19 17  --   ALKPHOS 110 80  --   BILITOT 0.5 0.2*  --   GFRNONAA 4* 4* 7*  GFRAA 5* 4* 8*  ANIONGAP 18* 20* 13    Hematology Recent Labs  Lab 01/07/20 1949 01/08/20 0200 01/08/20 0908  WBC 13.6* 13.4* 16.6*  RBC 2.86* 2.58* 2.54*  HGB 9.1* 8.2* 8.0*  HCT 26.2* 23.9* 23.8*  MCV 91.6 92.6 93.7  MCH 31.8 31.8 31.5    MCHC 34.7 34.3 33.6  RDW 17.2* 17.5* 17.7*  PLT 136* 129* 129*   BNPNo results for input(s): BNP, PROBNP in the last 168 hours.  DDimer No results for input(s): DDIMER in the last 168 hours.   Radiology  DG Chest Portable 1 View  Result Date: 01/06/2020 CLINICAL DATA:  59 year old female with central line placement. EXAM: PORTABLE CHEST 1 VIEW COMPARISON:  Chest radiograph dated 02/05/2019. FINDINGS: Left IJ central venous line tip close to the cavoatrial junction. There are minimal bibasilar atelectasis. No focal consolidation, pleural effusion, or pneumothorax. Stable cardiac silhouette. Median sternotomy wires and mechanical cardiac valve. Atherosclerotic calcification of the aorta. No acute osseous pathology. IMPRESSION: Left IJ central venous line with tip close to the cavoatrial junction. No pneumothorax. Electronically Signed   By: Anner Crete M.D.   On: 01/06/2020 22:08   CT RENAL STONE STUDY  Result Date: 01/06/2020 CLINICAL DATA:  Left flank pain EXAM: CT ABDOMEN AND PELVIS WITHOUT CONTRAST TECHNIQUE: Multidetector CT imaging of the abdomen and pelvis was performed following the standard protocol without IV contrast. COMPARISON:  01/06/2019 FINDINGS: Lower chest: Areas of linear consolidation at the lung bases likely reflect atelectasis or scarring. Postsurgical changes are seen from aortic and mitral valve replacements. Hepatobiliary: No focal liver abnormality is seen. No gallstones, gallbladder wall thickening, or biliary dilatation. Pancreas: The head and body of the pancreas are unremarkable. The tail the pancreas is difficult to visualize given an adjacent process within the left retroperitoneum. No definite intraparenchymal abnormalities. Spleen: Diffuse splenic calcifications unchanged. Adrenals/Urinary Tract: Right kidney is markedly atrophic with numerous small renal cortical cyst. The left kidney is obscured by a large heterogeneous process in the left renal fossa measuring  8.7 x 6.7 cm in transverse dimension, and measuring 6.7 cm in craniocaudal length. Differential includes renal hemorrhage or hemorrhagic renal neoplasm. Evaluation is limited without IV contrast. Fat stranding extends in the retroperitoneum inferior to the left kidney consistent with retroperitoneal hemorrhage. Stomach/Bowel: No bowel obstruction or ileus. Scattered diverticulosis of the sigmoid colon without diverticulitis. Vascular/Lymphatic: Extensive vascular calcifications are seen throughout the aorta and its branches. No pathologic adenopathy. Reproductive: Calcified uterine fibroids are noted. No adnexal masses. Other: No free fluid or free gas.  No abdominal wall hernia. Musculoskeletal: Bones are diffusely sclerotic consistent with renal osteodystrophy and history of end-stage renal disease. No acute or destructive bony  lesions. IMPRESSION: 1. Large left-sided retroperitoneal hematoma centered in the left renal fossa. Differential includes spontaneous perirenal hemorrhage versus hemorrhagic renal mass. 2. Diverticulosis without diverticulitis. 3.  Aortic Atherosclerosis (ICD10-I70.0). These results were called by telephone at the time of interpretation on 01/06/2020 at 4:39pm to provider DR. Ralene Bathe, who verbally acknowledged these results. Electronically Signed   By: Randa Ngo M.D.   On: 01/06/2020 16:47    Cardiac Studies   TTE 03/12/2019 1. Left ventricular ejection fraction, by visual estimation, is 65 to  70%. The left ventricle has normal function. Normal left ventricular size.  There is mildly increased left ventricular hypertrophy.  2. Global right ventricle has normal systolic function.The right  ventricular size is normal. No increase in right ventricular wall  thickness.  3. Left atrial size was mildly dilated.  4. Right atrial size was normal.  5. The mitral valve has been repaired/replaced. No evidence of mitral  valve regurgitation. No evidence of mitral stenosis.  6.  Sorin [employer] Optiform bileaflet mechanical valve (size 31 mm, cat  # [id number], serial # [id number]).   Mean gradient 14mmHg.  7. The tricuspid valve is normal in structure. Tricuspid valve  regurgitation is trivial.  8. Aortic valve regurgitation was not visualized by color flow Doppler.  Structurally normal aortic valve, with no evidence of sclerosis or  stenosis.  9. Mechanical prosthesis in the aortic valve position.  10. Sorin [employer] Top-Hat bileaflet mechanical valve (size 21 mm, cat  # [id number], serial # [id number]).   Peak velocity: 2.30m/s, mean gradient 35mmHg.  11. The pulmonic valve was normal in structure. Pulmonic valve  regurgitation is not visualized by color flow Doppler.  12. Normal pulmonary artery systolic pressure.  13. The inferior vena cava is normal in size with greater than 50%  respiratory variability, suggesting right atrial pressure of 3 mmHg  Patient Profile  [name] is a 59 y.o. female with ESRD, rheumatic AS/MS s/p mechanical AVR/MVR, HTN, lupus, admitted 01/06/2020 with spontaneous RP bleed.   Assessment & Plan   1. Acute RP Bleed -Hgb still trending down. Received several pRBCs. -no AC for now -will need to start heparin no bolus once hgb stable for 24 hours. Maybe tomorrow per discussion with CCM -will need to be started in ICU as may need IR evaluation for embolization if re-bleeds  2. Mechanical MVR/AVR -INR goal 2.5-3.5 -had replacement due to rheumatic MS/AS -hold AC for now given life-threatening bleed -bleed may be stabilizing and may be able to start heparin tomorrow. Clearly, only re-challenge when safe. May fail and need IR evaluation. We will follow along.   For questions or updates, please contact [place] Please consult [email or web address] for contact info under   Time Spent with Patient: I have spent a total of 15 minutes with patient reviewing hospital notes, telemetry, EKGs, labs and examining the patient as  well as establishing an assessment and plan that was discussed with the patient.  > 50% of time was spent in direct patient care.    Signed, Addison Naegeli. Audie Box, [place]  01/08/2020 11:33 AM

## 2020-01-09 LAB — CBC
HCT: 22.2 % — ABNORMAL LOW (ref 36.0–46.0)
HCT: 20.4 % — ABNORMAL LOW (ref 36.0–46.0)
Hemoglobin: 7.2 g/dL — ABNORMAL LOW (ref 12.0–15.0)
Hemoglobin: 6.7 g/dL — CL (ref 12.0–15.0)
MCH: 31.3 pg (ref 26.0–34.0)
MCH: 31.9 pg (ref 26.0–34.0)
MCHC: 32.4 g/dL (ref 30.0–36.0)
MCHC: 32.8 g/dL (ref 30.0–36.0)
MCV: 96.5 fL (ref 80.0–100.0)
MCV: 97.1 fL (ref 80.0–100.0)
Platelets: 122 10*3/uL — ABNORMAL LOW (ref 150–400)
Platelets: 132 10*3/uL — ABNORMAL LOW (ref 150–400)
RBC: 2.3 MIL/uL — ABNORMAL LOW (ref 3.87–5.11)
RBC: 2.1 MIL/uL — ABNORMAL LOW (ref 3.87–5.11)
RDW: 17.3 % — ABNORMAL HIGH (ref 11.5–15.5)
RDW: 17.1 % — ABNORMAL HIGH (ref 11.5–15.5)
WBC: 15.9 10*3/uL — ABNORMAL HIGH (ref 4.0–10.5)
WBC: 14.2 10*3/uL — ABNORMAL HIGH (ref 4.0–10.5)
nRBC: 0.3 % — ABNORMAL HIGH (ref 0.0–0.2)
nRBC: 0.2 % (ref 0.0–0.2)

## 2020-01-09 LAB — RENAL FUNCTION PANEL
Albumin: 2.9 g/dL — ABNORMAL LOW (ref 3.5–5.0)
Anion gap: 13 (ref 5–15)
BUN: 12 mg/dL (ref 6–20)
CO2: 29 mmol/L (ref 22–32)
Calcium: 8.3 mg/dL — ABNORMAL LOW (ref 8.9–10.3)
Chloride: 93 mmol/L — ABNORMAL LOW (ref 98–111)
Creatinine, Ser: 3.54 mg/dL — ABNORMAL HIGH (ref 0.44–1.00)
GFR calc Af Amer: 16 mL/min — ABNORMAL LOW (ref >60)
GFR calc non Af Amer: 14 mL/min — ABNORMAL LOW (ref >60)
Glucose, Bld: 97 mg/dL (ref 70–99)
Phosphorus: 4.4 mg/dL (ref 2.5–4.6)
Potassium: 3.5 mmol/L (ref 3.5–5.1)
Sodium: 135 mmol/L (ref 135–145)

## 2020-01-09 LAB — PREPARE RBC (CROSSMATCH)

## 2020-01-09 LAB — PROTIME-INR
INR: 1.2 (ref 0.8–1.2)
Prothrombin Time: 14.6 seconds (ref 11.4–15.2)

## 2020-01-09 MED ORDER — SODIUM CHLORIDE 0.9% IV SOLUTION: Freq: Once | INTRAVENOUS | Status: DC

## 2020-01-09 MED ORDER — HEPARIN (PORCINE) 25000 UT/250ML-% IV SOLN
900.0000 [IU]/h | INTRAVENOUS | Status: DC
  Administered 2020-01-09: 900 [IU]/h via INTRAVENOUS
  Filled 2020-01-09: qty 250

## 2020-01-09 NOTE — Progress Notes (Signed)
ANTICOAGULATION CONSULT NOTE - Initial Consult  Pharmacy Consult for Heparin Indication: mech heart valves  No Known Allergies  Patient Measurements: Height: 5\' 6"  (167.6 cm) Weight: 65.1 kg (143 lb 8.3 oz) IBW/kg (Calculated) : 59.3 Heparin Dosing Weight: 65.1 kg  Vital Signs: Temp: 99.4 F (37.4 C) (08/21 0800) Temp Source: Oral (08/21 0800) BP: 111/75 (08/21 0700) Pulse Rate: 112 (08/21 0700)  Labs: Recent Labs    01/07/20 0350 01/07/20 1615 01/08/20 0200 01/08/20 0500 01/08/20 0908 01/08/20 0908 01/08/20 1542 01/09/20 0414  HGB 7.5*   < >   < >  --  8.0*   < > 7.8* 7.2*  HCT 23.0*   < >   < >  --  23.8*  --  23.1* 22.2*  PLT 164   < >   < >  --  129*  --  123* 122*  LABPROT 16.8*  --   --  14.5  --   --   --  14.6  INR 1.4*  --   --  1.2  --   --   --  1.2  CREATININE 10.76*  --   --  5.90*  --   --   --  3.54*   < > = values in this interval not displayed.    Estimated Creatinine Clearance: 16.4 mL/min (A) (by C-G formula based on SCr of 3.54 mg/dL (H)).   Medical History: Past Medical History:  Diagnosis Date  . Anemia   . Arthritis    "joints" (10/23/2017)  . Blood transfusion '08   [facility]; "low HgB" (10/23/2017)  . Chronic diastolic CHF (congestive heart failure) ([place])   . Claustrophobia   . Dysfunctional uterine bleeding 12/19/2010  . ESRD (end stage renal disease) on dialysis [facility])    "MWF; [name]." (10/23/2017)  . GERD (gastroesophageal reflux disease)   . Headache   . Hemodialysis patient [facility])    right extremity port  . History of hiatal hernia   . Hx of cardiovascular stress test    Lexiscan Myoview 4/16:  Normal stress nuclear study, EF 59%  . Hypertension   . Lupus ([place])    "? kind" (10/23/2017)  . Mitral stenosis    Echo 4/16:  EF 55-60%, no RWMA, Gr 1 DD, mod MS (mean 9 mmHg), mod LAE, mild RAE, PASP 65, mod to severe TR, trivial eff // Echo 6/19:  Mild LVH, EF 55-60, no RWMA, Gr 2 DD, mild to mod AS (Mean 20), severe MS (mean 17), massive  LAE, PASP 48, trivial effusion   . Peptic ulcer disease   . Pneumonia 10/21/2017  . S/P aortic valve replacement with metallic valve 08/01/9700   21 mm Sorin [employer]omed[employer] bileaflet mechanical valve  . S/P mitral valve replacement with metallic valve 6/37/8588   31 mm Sorin [employer] Optiform bileaflet mechanical valve  . Stroke [facility])    per patient "they said i had a small stroke but i couldnt even tell"  . Valvular heart disease (Aortic Stenosis and Mitral Stenosis) 09/25/2016   s/p mechanical AVR and MVR 11/2018 // Echo 02/2019: EF 65-70, mild LVH, mild LAE, normally functioning mechanical mitral valve prosthesis with no regurgitation or stenosis, trivial TR, normally functioning mechanical aortic valve prosthesis without regurgitation or stenosis      Assessment: CC/HPI: N/V abdominal pain>>retroperitoneal bleed  FOY:DXAJOINOM MS/AS s/p mech AVR/MVR 11/2018 , ESRD MWF, CHF, CVA, lupus, anemia, HTN, anemia, arthritis, claustrophobia, GERD, PUD, h/o HH,   Goal of Therapy:  Heparin  level 0.3-0.7 units/ml Monitor platelets by anticoagulation protocol: Yes   Plan:  IV heparin, no bolus at 900 units/hr Low goal with recent RP bleed Heparin level 8 hrs after starts Daily HL and CBC  Marielle Mantione S. Alford Highland, PharmD, BCPS Clinical Staff Pharmacist [email or web address] Alford Highland, [employer] 01/09/2020,10:46 AM

## 2020-01-09 NOTE — Progress Notes (Signed)
  [facility] Progress Note    Assessment/ Plan:   1. Acute blood loss anemia, left sided RP hematoma. S/p ffp, prbc, and vit k -anticoagulation on hold -serial h/h, transfuse prn. Mgmt per primary 2. H/p AVR and MVR on chronic anticoagulation (coumadin). A/c on hold as of now, cardio on board 3. Leukocytosis: secondary to hematoma? Afebrile, monitor for now 4. ESRD, s/p Hd yesterday, next HD on mon 8/23 5. Secondary hyperparathyroidism. Limit phos in diet, sensipar and calcitriol (phos improved to 4.4) 6. HTN, home meds on hold 7. Nausea/vomiting. Mgmt per primary service. Antiemetics while watch qtc  Outpatient dialysis orders: 4hrs, F180, edw 61kg, 450/800, 2k, 2cal, 137na, 35bicarb, uf profile #2, hep 1800 bolus -sensipar 180mg  -calcitriol 50mcg -no esa, no iron  Gean Quint, MD [facility] 01/08/2020, 10:31 AM    Subjective:   Tolerated dialysis yesterday.  Net UF 1 L.  She reports that her abdominal pain, nausea, vomiting is still there but better as compared to when she came in.  No other complaints   Objective:   BP 111/75   Pulse (!) 112   Temp 99.4 F (37.4 C) (Oral)   Resp 18   Ht 5\' 6"  (1.676 m)   Wt 65.1 kg   LMP 12/05/2010 (LMP Unknown)   SpO2 100%   BMI 23.16 kg/m   Intake/Output Summary (Last 24 hours) at 01/09/2020 1003 Last data filed at 01/08/2020 1630 Gross per 24 hour  Intake 10 ml  Output 1000 ml  Net -990 ml   Weight change: -2 kg  Physical Exam: Gen:ill appearing, comfortable CVS: tachycardic Resp:unlabored, bl chest expansion ZOX:WRUEAV left quadrants Ext:no edema Neuro: awake, alert, speech clear and coherent Access: rue avf with +b/t  Imaging: No results found.  Labs: BMET Recent Labs  Lab 01/06/20 0911 01/07/20 0350 01/08/20 0500 01/09/20 0414  NA 136 138 134* 135  K 3.9 4.5 3.8 3.5  CL 91* 92* 93* 93*  CO2 27 26 28 29   GLUCOSE 118* 95 94 97  BUN 63* 78* 30* 12  CREATININE 9.54* 10.76*  5.90* 3.54*  CALCIUM 9.3 9.2 9.2 8.3*  PHOS  --  9.5* 8.1* 4.4   CBC Recent Labs  Lab 01/08/20 0200 01/08/20 0908 01/08/20 1542 01/09/20 0414  WBC 13.4* 16.6* 18.1* 15.9*  NEUTROABS  --   --  16.3*  --   HGB 8.2* 8.0* 7.8* 7.2*  HCT 23.9* 23.8* 23.1* 22.2*  MCV 92.6 93.7 93.1 96.5  PLT 129* 129* 123* 122*    Medications:    . sodium chloride   Intravenous Once  . sodium chloride   Intravenous Once  . sodium chloride   Intravenous Once  . sodium chloride   Intravenous Once  . sodium chloride   Intravenous Once  . calcitRIOL  2 mcg Oral Q M,W,F  . Chlorhexidine Gluconate Cloth  6 each Topical Daily  . mouth rinse  15 mL Mouth Rinse BID  . pantoprazole (PROTONIX) IV  40 mg Intravenous Daily  . sodium chloride flush  10-40 mL Intracatheter Q12H

## 2020-01-09 NOTE — Progress Notes (Signed)
Physician notified: Icard At: [id number]  Regarding: Hgb 6.7, on heparin gtt  Order(s): Stop heparin. Infuse 1 unit PRBC, repeat H+H 2 hours post transfusion.

## 2020-01-09 NOTE — Progress Notes (Signed)
NAME:  [name], MRN:  [id number], DOB:  10/16/1960, LOS: 3 ADMISSION DATE:  01/06/2020, CONSULTATION DATE:  01/06/2020 REFERRING MD:  Quintella Reichert, MD CHIEF COMPLAINT:  Retroperitoneal hemorrhage   Brief History   Patient is a 59 year old female with history of ESRD on HD MWF, s/p aortic and mitral valve replacement on warfarin therapy who presented with nausea, nonbloody emesis and abdominal pain; found to have spontaneous left retroperitoneal bleed. She has received 1u pRBC and 2u FFP and vitamin K.   Past Medical History   Past Medical History:  Diagnosis Date   Anemia    Arthritis    "joints" (10/23/2017)   Blood transfusion '08   [facility]; "low HgB" (10/23/2017)   Chronic diastolic CHF (congestive heart failure) ([place])    Claustrophobia    Dysfunctional uterine bleeding 12/19/2010   ESRD (end stage renal disease) on dialysis ([place])    "MWF; [name]." (10/23/2017)   GERD (gastroesophageal reflux disease)    Headache    Hemodialysis patient ([place])    right extremity port   History of hiatal hernia    Hx of cardiovascular stress test    Lexiscan Myoview 4/16:  Normal stress nuclear study, EF 59%   Hypertension    Lupus ([place])    "? kind" (10/23/2017)   Mitral stenosis    Echo 4/16:  EF 55-60%, no RWMA, Gr 1 DD, mod MS (mean 9 mmHg), mod LAE, mild RAE, PASP 65, mod to severe TR, trivial eff // Echo 6/19:  Mild LVH, EF 55-60, no RWMA, Gr 2 DD, mild to mod AS (Mean 20), severe MS (mean 17), massive LAE, PASP 48, trivial effusion    Peptic ulcer disease    Pneumonia 10/21/2017   S/P aortic valve replacement with metallic valve 7/37/1062   21 mm Sorin [employer] bileaflet mechanical valve   S/P mitral valve replacement with metallic valve 6/94/8546   31 mm Sorin [employer] Optiform bileaflet mechanical valve   Stroke ([place])    per patient "they said i had a small stroke but i couldnt even tell"   Valvular heart disease (Aortic Stenosis and Mitral Stenosis)  09/25/2016   s/p mechanical AVR and MVR 11/2018 // Echo 02/2019: EF 65-70, mild LVH, mild LAE, normally functioning mechanical mitral valve prosthesis with no regurgitation or stenosis, trivial TR, normally functioning mechanical aortic valve prosthesis without regurgitation or stenosis    Significant Hospital Events   8/18 > admitted to ICU  Consults:  [facility]  Procedures:    Significant Diagnostic Tests:  CT Renal Stone 8/18 > large left sided retroperitoneal hematoma centered in the left renal fossa; diverticulosis without diverticulitis   Micro Data:  SARS CoV2 > Negative MRSA > Negative   Antimicrobials:  None   Interim history/subjective:   Tolerated dialysis last night.  Hemodynamically stable.  Patient's hemoglobin has maintained stability  Objective   Blood pressure 120/66, pulse (!) 114, temperature 98.7 F (37.1 C), temperature source Oral, resp. rate 14, height 5\' 6"  (1.676 m), weight 65.1 kg, last menstrual period 12/05/2010, SpO2 100 %.        Intake/Output Summary (Last 24 hours) at 01/09/2020 0748 Last data filed at 01/08/2020 1630 Gross per 24 hour  Intake 10 ml  Output 1000 ml  Net -990 ml   Filed Weights   01/06/20 0906 01/07/20 1300 01/08/20 1629  Weight: 61.7 kg 67.1 kg 65.1 kg    Examination: General: Elderly female, appears older than stated  age resting in bed no distress HENT: NCAT, tracking appropriately Lungs: Clear to auscultation, no crackles no wheeze Cardiovascular: Regular rhythm, S1-S2 Abdomen: Tenderness to palpation in the left lower quadrant nondistended Extremities: Right upper extremity AV fistula Neuro: a alert oriented following commands no focal deficit Skin: No rash  Resolved Hospital Problem list    Assessment & Plan:   Acute blood loss anemia 2/2 spontaneous left retroperitoneal bleed:  Plan: Her hemoglobin hovers around 8 at her baseline. Looking back for the past several months  she has had hemoglobins as low as mid sevens  Aortic and mitral stenosis s/p AVR and MVR Plan: Restart heparin ggt no bolus Cardiology would like to start anticoagulation as soon as possible -Follow hemoglobin, repeat CBC 4PM  - if dropping we will stop and transfuse  ESRD  HD per nephrology Continue HD to maintain euvolemia.  QTc prolongation Avoid QT prolonging drugs  Pain management As needed morphine and Percocet.  Best practice:  Diet: renal diet  Pain/Anxiety/Delirium protocol (if indicated): morphine and oxycodone for pain  VAP protocol (if indicated): n/a DVT prophylaxis: SCDs GI prophylaxis: PPI Glucose control: N/a Mobility: Bed rest Code Status: FULL Family Communication: patient updated at bedside  Disposition: ICU  This patient is critically ill with multiple organ system failure; which, requires frequent high complexity decision making, assessment, support, evaluation, and titration of therapies. This was completed through the application of advanced monitoring technologies and extensive interpretation of multiple databases. During this encounter critical care time was devoted to patient care services described in this note for 31 minutes.  Garner Nash, DO [facility] 01/09/2020 7:50 AM

## 2020-01-10 LAB — TYPE AND SCREEN
ABO/RH(D): A NEG
ABO/RH(D): A NEG
Antibody Screen: NEGATIVE
Antibody Screen: NEGATIVE
Unit division: 0
Unit division: 0
Unit division: 0
Unit division: 0

## 2020-01-10 LAB — CBC
HCT: 24.2 % — ABNORMAL LOW (ref 36.0–46.0)
HCT: 24.8 % — ABNORMAL LOW (ref 36.0–46.0)
Hemoglobin: 7.8 g/dL — ABNORMAL LOW (ref 12.0–15.0)
Hemoglobin: 8 g/dL — ABNORMAL LOW (ref 12.0–15.0)
MCH: 30.6 pg (ref 26.0–34.0)
MCH: 30.7 pg (ref 26.0–34.0)
MCHC: 32.2 g/dL (ref 30.0–36.0)
MCHC: 32.3 g/dL (ref 30.0–36.0)
MCV: 94.9 fL (ref 80.0–100.0)
MCV: 95 fL (ref 80.0–100.0)
Platelets: 123 10*3/uL — ABNORMAL LOW (ref 150–400)
Platelets: 134 10*3/uL — ABNORMAL LOW (ref 150–400)
RBC: 2.55 MIL/uL — ABNORMAL LOW (ref 3.87–5.11)
RBC: 2.61 MIL/uL — ABNORMAL LOW (ref 3.87–5.11)
RDW: 16.8 % — ABNORMAL HIGH (ref 11.5–15.5)
RDW: 16.9 % — ABNORMAL HIGH (ref 11.5–15.5)
WBC: 13.4 10*3/uL — ABNORMAL HIGH (ref 4.0–10.5)
WBC: 13.5 10*3/uL — ABNORMAL HIGH (ref 4.0–10.5)
nRBC: 0.1 % (ref 0.0–0.2)
nRBC: 0.1 % (ref 0.0–0.2)

## 2020-01-10 LAB — BPAM RBC
Blood Product Expiration Date: 202108312359
Blood Product Expiration Date: 202109012359
Blood Product Expiration Date: 202109062359
Blood Product Expiration Date: 202109162359
ISSUE DATE / TIME: 202108182036
ISSUE DATE / TIME: 202108190510
ISSUE DATE / TIME: 202108190917
ISSUE DATE / TIME: 202108211834
Unit Type and Rh: 600
Unit Type and Rh: 600
Unit Type and Rh: 600
Unit Type and Rh: 600

## 2020-01-10 LAB — RENAL FUNCTION PANEL
Albumin: 2.6 g/dL — ABNORMAL LOW (ref 3.5–5.0)
Anion gap: 12 (ref 5–15)
BUN: 29 mg/dL — ABNORMAL HIGH (ref 6–20)
CO2: 28 mmol/L (ref 22–32)
Calcium: 8.8 mg/dL — ABNORMAL LOW (ref 8.9–10.3)
Chloride: 92 mmol/L — ABNORMAL LOW (ref 98–111)
Creatinine, Ser: 6.39 mg/dL — ABNORMAL HIGH (ref 0.44–1.00)
GFR calc Af Amer: 8 mL/min — ABNORMAL LOW (ref >60)
GFR calc non Af Amer: 7 mL/min — ABNORMAL LOW (ref >60)
Glucose, Bld: 80 mg/dL (ref 70–99)
Phosphorus: 5.4 mg/dL — ABNORMAL HIGH (ref 2.5–4.6)
Potassium: 3.7 mmol/L (ref 3.5–5.1)
Sodium: 132 mmol/L — ABNORMAL LOW (ref 135–145)

## 2020-01-10 LAB — PROTIME-INR
INR: 1.2 (ref 0.8–1.2)
Prothrombin Time: 14.2 seconds (ref 11.4–15.2)

## 2020-01-10 MED ORDER — ONDANSETRON 4 MG PO TBDP
4.0000 mg | ORAL_TABLET | Freq: Three times a day (TID) | ORAL | Status: DC | PRN
  Administered 2020-01-13 – 2020-01-18 (×4): 4 mg via ORAL
  Filled 2020-01-10 (×7): qty 1

## 2020-01-10 MED ORDER — ONDANSETRON 4 MG PO TBDP
ORAL_TABLET | ORAL | Status: AC
  Filled 2020-01-10: qty 1

## 2020-01-10 MED ORDER — CALCIUM CARBONATE ANTACID 500 MG PO CHEW
1.0000 | CHEWABLE_TABLET | Freq: Four times a day (QID) | ORAL | Status: DC | PRN
  Administered 2020-01-10 – 2020-01-16 (×4): 200 mg via ORAL
  Filled 2020-01-10 (×3): qty 1

## 2020-01-10 NOTE — Progress Notes (Signed)
Called [name] and spoke with Solmon Ice, RN and made her aware that patient is complaining of heart burn and asking for tums. Solmon Ice, RN to ask [facility] MD about tums.

## 2020-01-10 NOTE — Plan of Care (Signed)
  Problem: Education: Goal: Knowledge of General Education information will improve Description: Including pain rating scale, medication(s)/side effects and non-pharmacologic comfort measures Outcome: Progressing   Problem: Health Behavior/Discharge Planning: Goal: Ability to manage health-related needs will improve Outcome: Progressing   Problem: Clinical Measurements: Goal: Will remain free from infection Outcome: Progressing Goal: Respiratory complications will improve Outcome: Progressing Goal: Cardiovascular complication will be avoided Outcome: Progressing   Problem: Coping: Goal: Level of anxiety will decrease Outcome: Progressing   Problem: Pain Managment: Goal: General experience of comfort will improve Outcome: Progressing   Problem: Safety: Goal: Ability to remain free from injury will improve Outcome: Progressing   Problem: Skin Integrity: Goal: Risk for impaired skin integrity will decrease Outcome: Progressing

## 2020-01-10 NOTE — Progress Notes (Signed)
[facility] Progress Note    Assessment/ Plan:   1. Acute blood loss anemia, left sided RP hematoma. S/p ffp, prbc, and vit k -anticoagulation back on hold -serial h/h, transfuse prn. Mgmt per primary.  If concerns for rebleed may need IR 2. H/p AVR and MVR on chronic anticoagulation (coumadin). A/c back on hold as of now, cardio on board 3. Leukocytosis: secondary to hematoma? Afebrile, monitor for now 4. ESRD, s/p Hd yesterday, next HD on mon 8/23 5. Secondary hyperparathyroidism. Limit phos in diet, sensipar and calcitriol (phos 5.4 today).  On Tums which is reasonable in the setting of indigestion and hyperphosphatemia 6. HTN, home meds on hold, may resume metoprolol but will defer to primary service for this 7. Nausea/vomiting. Mgmt per primary service. Antiemetics while watch qtc.  Symptoms improved  Outpatient dialysis orders: 4hrs, F180, edw 61kg, 450/800, 2k, 2cal, 137na, 35bicarb, uf profile #2, hep 1800 bolus -sensipar 180mg  -calcitriol 42mcg -no esa, no iron  Gean Quint, MD [facility] 01/08/2020, 10:31 AM    Subjective:   Started on heparin drip yesterday however hemoglobin drop requiring PRBC transfusion.  Otherwise feels better and not as nauseated as before.  Still has some soreness on the left side of her abdomen.  Does not feel like she has much fluid on today.  She does report palpitations and typically takes metoprolol for this.  Also reports some indigestion and itchiness   Objective:   BP 128/74   Pulse (!) 124   Temp 98.8 F (37.1 C) (Oral)   Resp 14   Ht 5\' 6"  (1.676 m)   Wt 68.3 kg   LMP 12/05/2010 (LMP Unknown)   SpO2 93%   BMI 24.30 kg/m   Intake/Output Summary (Last 24 hours) at 01/10/2020 1018 Last data filed at 01/09/2020 2200 Gross per 24 hour  Intake 368.4 ml  Output 0 ml  Net 368.4 ml   Weight change: 3.2 kg  Physical Exam: Gen: No acute distress, comfortable CVS: tachycardic, +murmur, loud  S2 Resp:unlabored, bl chest expansion, clear to auscultation bilaterally BBC:WUGQBV left quadrants and firm Ext:no edema Neuro: awake, alert, speech clear and coherent Access: rue avf with +b/t  Imaging: No results found.  Labs: BMET Recent Labs  Lab 01/06/20 0911 01/07/20 0350 01/08/20 0500 01/09/20 0414 01/10/20 0527  NA 136 138 134* 135 132*  K 3.9 4.5 3.8 3.5 3.7  CL 91* 92* 93* 93* 92*  CO2 27 26 28 29 28   GLUCOSE 118* 95 94 97 80  BUN 63* 78* 30* 12 29*  CREATININE 9.54* 10.76* 5.90* 3.54* 6.39*  CALCIUM 9.3 9.2 9.2 8.3* 8.8*  PHOS  --  9.5* 8.1* 4.4 5.4*   CBC Recent Labs  Lab 01/08/20 1542 01/08/20 1542 01/09/20 0414 01/09/20 1535 01/09/20 2348 01/10/20 0527  WBC 18.1*   < > 15.9* 14.2* 13.4* 13.5*  NEUTROABS 16.3*  --   --   --   --   --   HGB 7.8*   < > 7.2* 6.7* 7.8* 8.0*  HCT 23.1*   < > 22.2* 20.4* 24.2* 24.8*  MCV 93.1   < > 96.5 97.1 94.9 95.0  PLT 123*   < > 122* 132* 123* 134*   < > = values in this interval not displayed.    Medications:    . sodium chloride   Intravenous Once  . calcitRIOL  2 mcg Oral Q M,W,F  . Chlorhexidine Gluconate Cloth  6 each Topical Daily  .  mouth rinse  15 mL Mouth Rinse BID  . ondansetron      . pantoprazole (PROTONIX) IV  40 mg Intravenous Daily  . sodium chloride flush  10-40 mL Intracatheter Q12H

## 2020-01-10 NOTE — Progress Notes (Signed)
eLink Physician-Brief Progress Note Patient Name: [name] DOB: [birth year] MRN: [id number]   Date of Service  01/10/2020  HPI/Events of Note  Heartburn - Request for Tums.   eICU Interventions  Will order Tums 1 tablet PO Q 6 hours PRN heartburn.      Intervention Category Major Interventions: Other:  [name] 01/10/2020, 3:03 AM

## 2020-01-11 LAB — RENAL FUNCTION PANEL
Albumin: 2.6 g/dL — ABNORMAL LOW (ref 3.5–5.0)
Anion gap: 17 — ABNORMAL HIGH (ref 5–15)
BUN: 49 mg/dL — ABNORMAL HIGH (ref 6–20)
CO2: 25 mmol/L (ref 22–32)
Calcium: 9.1 mg/dL (ref 8.9–10.3)
Chloride: 88 mmol/L — ABNORMAL LOW (ref 98–111)
Creatinine, Ser: 8.58 mg/dL — ABNORMAL HIGH (ref 0.44–1.00)
GFR calc Af Amer: 5 mL/min — ABNORMAL LOW (ref >60)
GFR calc non Af Amer: 5 mL/min — ABNORMAL LOW (ref >60)
Glucose, Bld: 82 mg/dL (ref 70–99)
Phosphorus: 5.7 mg/dL — ABNORMAL HIGH (ref 2.5–4.6)
Potassium: 3.9 mmol/L (ref 3.5–5.1)
Sodium: 130 mmol/L — ABNORMAL LOW (ref 135–145)

## 2020-01-11 LAB — CBC
HCT: 25.3 % — ABNORMAL LOW (ref 36.0–46.0)
HCT: 26.5 % — ABNORMAL LOW (ref 36.0–46.0)
Hemoglobin: 8.3 g/dL — ABNORMAL LOW (ref 12.0–15.0)
Hemoglobin: 8.4 g/dL — ABNORMAL LOW (ref 12.0–15.0)
MCH: 31.6 pg (ref 26.0–34.0)
MCH: 30.7 pg (ref 26.0–34.0)
MCHC: 32.8 g/dL (ref 30.0–36.0)
MCHC: 31.7 g/dL (ref 30.0–36.0)
MCV: 96.2 fL (ref 80.0–100.0)
MCV: 96.7 fL (ref 80.0–100.0)
Platelets: 160 10*3/uL (ref 150–400)
Platelets: 172 10*3/uL (ref 150–400)
RBC: 2.63 MIL/uL — ABNORMAL LOW (ref 3.87–5.11)
RBC: 2.74 MIL/uL — ABNORMAL LOW (ref 3.87–5.11)
RDW: 16.3 % — ABNORMAL HIGH (ref 11.5–15.5)
RDW: 16.1 % — ABNORMAL HIGH (ref 11.5–15.5)
WBC: 11.3 10*3/uL — ABNORMAL HIGH (ref 4.0–10.5)
WBC: 9.6 10*3/uL (ref 4.0–10.5)
nRBC: 0 % (ref 0.0–0.2)
nRBC: 0 % (ref 0.0–0.2)

## 2020-01-11 LAB — PROTIME-INR
INR: 1.1 (ref 0.8–1.2)
Prothrombin Time: 13.9 seconds (ref 11.4–15.2)

## 2020-01-11 LAB — TSH: TSH: 1.615 u[IU]/mL (ref 0.350–4.500)

## 2020-01-11 LAB — HEPARIN LEVEL (UNFRACTIONATED): Heparin Unfractionated: 0.17 IU/mL — ABNORMAL LOW (ref 0.30–0.70)

## 2020-01-11 MED ORDER — HEPARIN SODIUM (PORCINE) 1000 UNIT/ML DIALYSIS: 1000.0000 [IU] | INTRAMUSCULAR | Status: DC | PRN

## 2020-01-11 MED ORDER — HEPARIN (PORCINE) 25000 UT/250ML-% IV SOLN
1150.0000 [IU]/h | INTRAVENOUS | Status: DC
  Administered 2020-01-11: 900 [IU]/h via INTRAVENOUS
  Administered 2020-01-12: 1150 [IU]/h via INTRAVENOUS
  Administered 2020-01-13: 1250 [IU]/h via INTRAVENOUS
  Administered 2020-01-14: 1150 [IU]/h via INTRAVENOUS
  Administered 2020-01-14: 1250 [IU]/h via INTRAVENOUS
  Administered 2020-01-15: 1150 [IU]/h via INTRAVENOUS
  Filled 2020-01-11 (×5): qty 250

## 2020-01-11 MED ORDER — ALTEPLASE 2 MG IJ SOLR: 2.0000 mg | Freq: Once | INTRAMUSCULAR | Status: DC | PRN

## 2020-01-11 MED ORDER — SODIUM CHLORIDE 0.9 % IV SOLN: 100.0000 mL | INTRAVENOUS | Status: DC | PRN

## 2020-01-11 MED ORDER — CALCIUM CARBONATE ANTACID 500 MG PO CHEW
CHEWABLE_TABLET | ORAL | Status: AC
  Filled 2020-01-11: qty 1

## 2020-01-11 MED ORDER — PENTAFLUOROPROP-TETRAFLUOROETH EX AERO: 1.0000 "application " | INHALATION_SPRAY | CUTANEOUS | Status: DC | PRN

## 2020-01-11 MED ORDER — LIDOCAINE HCL (PF) 1 % IJ SOLN: 5.0000 mL | INTRAMUSCULAR | Status: DC | PRN

## 2020-01-11 MED ORDER — SIMETHICONE 80 MG PO CHEW
80.0000 mg | CHEWABLE_TABLET | Freq: Two times a day (BID) | ORAL | Status: DC
  Administered 2020-01-11 – 2020-01-12 (×2): 80 mg via ORAL
  Filled 2020-01-11 (×2): qty 1

## 2020-01-11 MED ORDER — SENNA 8.6 MG PO TABS
1.0000 | ORAL_TABLET | Freq: Every day | ORAL | Status: DC
  Administered 2020-01-11 – 2020-02-11 (×31): 8.6 mg via ORAL
  Filled 2020-01-11 (×32): qty 1

## 2020-01-11 MED ORDER — METOPROLOL TARTRATE 25 MG PO TABS
25.0000 mg | ORAL_TABLET | Freq: Two times a day (BID) | ORAL | Status: DC
  Administered 2020-01-11 – 2020-02-12 (×62): 25 mg via ORAL
  Filled 2020-01-11 (×64): qty 1

## 2020-01-11 MED ORDER — DOCUSATE SODIUM 100 MG PO CAPS
100.0000 mg | ORAL_CAPSULE | Freq: Every day | ORAL | Status: DC
  Administered 2020-01-11 – 2020-02-12 (×32): 100 mg via ORAL
  Filled 2020-01-11 (×34): qty 1

## 2020-01-11 MED ORDER — LIDOCAINE-PRILOCAINE 2.5-2.5 % EX CREA: 1.0000 "application " | TOPICAL_CREAM | CUTANEOUS | Status: DC | PRN

## 2020-01-11 NOTE — Progress Notes (Addendum)
Cardiology Progress Note  Patient ID: [name] MRN: [id number] DOB: 02-16-1961 Date of Encounter: 01/11/2020  Primary Cardiologist: Dorris Carnes, MD  Subjective  Pt denies CP  Breathing is OK   Inpatient Medications  Scheduled Meds: . calcitRIOL  2 mcg Oral Q M,W,F  . Chlorhexidine Gluconate Cloth  6 each Topical Daily  . mouth rinse  15 mL Mouth Rinse BID  . pantoprazole (PROTONIX) IV  40 mg Intravenous Daily  . sodium chloride flush  10-40 mL Intracatheter Q12H   Continuous Infusions: . sodium chloride    . sodium chloride     PRN Meds: sodium chloride, sodium chloride, alteplase, calcium carbonate, heparin, lidocaine (PF), lidocaine-prilocaine, morphine injection, ondansetron, pentafluoroprop-tetrafluoroeth, sodium chloride flush   Vital Signs   Vitals:   01/11/20 0830 01/11/20 0900 01/11/20 0930 01/11/20 1000  BP: 123/72 (!) 157/91 138/80 (!) 146/82  Pulse: (!) 122 (!) 124 (!) 121 (!) 123  Resp:      Temp:      TempSrc:      SpO2:      Weight:      Height:        Intake/Output Summary (Last 24 hours) at 01/11/2020 1043 Last data filed at 01/11/2020 0600 Gross per 24 hour  Intake 360 ml  Output 0 ml  Net 360 ml   Last 3 Weights 01/11/2020 01/11/2020 01/10/2020  Weight (lbs) 146 lb 6.2 oz 151 lb 0.2 oz 150 lb 9.2 oz  Weight (kg) 66.4 kg 68.5 kg 68.3 kg      Telemetry   Sinus tachycardia 120s which I personally reviewed.   ECG   Physical Exam   Vitals:   01/11/20 0830 01/11/20 0900 01/11/20 0930 01/11/20 1000  BP: 123/72 (!) 157/91 138/80 (!) 146/82  Pulse: (!) 122 (!) 124 (!) 121 (!) 123  Resp:      Temp:      TempSrc:      SpO2:      Weight:      Height:         Intake/Output Summary (Last 24 hours) at 01/11/2020 1043 Last data filed at 01/11/2020 0600 Gross per 24 hour  Intake 360 ml  Output 0 ml  Net 360 ml    Last 3 Weights 01/11/2020 01/11/2020 01/10/2020  Weight (lbs) 146 lb 6.2 oz 151 lb 0.2 oz 150 lb 9.2 oz  Weight (kg) 66.4 kg  68.5 kg 68.3 kg    Body mass index is 23.63 kg/m.   General: Pt in NAD   On dialysis   Head: Atraumatic, normal size  Eyes: PEERLA, EOMI  Neck: JVP is normal   Cardiac: Normal S1, S2; tachycardia,  Crisp valve sounds  Lungs: Clear to auscultation bilaterally  No rales  Abd: Soft, nontender, no hepatomegaly  Ext: No edema,  Labs  High Sensitivity Troponin:  No results for input(s): TROPONINIHS in the last 720 hours.   Cardiac EnzymesNo results for input(s): TROPONINI in the last 168 hours. No results for input(s): TROPIPOC in the last 168 hours.  Chemistry Recent Labs  Lab 01/06/20 0911 01/06/20 0911 01/07/20 0350 01/08/20 0500 01/09/20 0414 01/10/20 0527 01/11/20 0540  NA 136   < > 138   < > 135 132* 130*  K 3.9   < > 4.5   < > 3.5 3.7 3.9  CL 91*   < > 92*   < > 93* 92* 88*  CO2 27   < > 26   < > 29  28 25  GLUCOSE 118*   < > 95   < > 97 80 82  BUN 63*   < > 78*   < > 12 29* 49*  CREATININE 9.54*   < > 10.76*   < > 3.54* 6.39* 8.58*  CALCIUM 9.3   < > 9.2   < > 8.3* 8.8* 9.1  PROT 8.4*  --  7.1  --   --   --   --   ALBUMIN 3.5   < > 3.3*   < > 2.9* 2.6* 2.6*  AST 28  --  30  --   --   --   --   ALT 19  --  17  --   --   --   --   ALKPHOS 110  --  80  --   --   --   --   BILITOT 0.5  --  0.2*  --   --   --   --   GFRNONAA 4*   < > 4*   < > 14* 7* 5*  GFRAA 5*   < > 4*   < > 16* 8* 5*  ANIONGAP 18*   < > 20*   < > 13 12 17*   < > = values in this interval not displayed.    Hematology Recent Labs  Lab 01/09/20 2348 01/10/20 0527 01/11/20 0539  WBC 13.4* 13.5* 11.3*  RBC 2.55* 2.61* 2.63*  HGB 7.8* 8.0* 8.3*  HCT 24.2* 24.8* 25.3*  MCV 94.9 95.0 96.2  MCH 30.6 30.7 31.6  MCHC 32.2 32.3 32.8  RDW 16.8* 16.9* 16.3*  PLT 123* 134* 160   BNPNo results for input(s): BNP, PROBNP in the last 168 hours.  DDimer No results for input(s): DDIMER in the last 168 hours.   Radiology  No results found.  Cardiac Studies   TTE 03/12/2019 1. Left ventricular ejection  fraction, by visual estimation, is 65 to  70%. The left ventricle has normal function. Normal left ventricular size.  There is mildly increased left ventricular hypertrophy.  2. Global right ventricle has normal systolic function.The right  ventricular size is normal. No increase in right ventricular wall  thickness.  3. Left atrial size was mildly dilated.  4. Right atrial size was normal.  5. The mitral valve has been repaired/replaced. No evidence of mitral  valve regurgitation. No evidence of mitral stenosis.  6. Sorin [employer] Optiform bileaflet mechanical valve (size 31 mm, cat  # [id number], serial # [id number]).   Mean gradient 81mmHg.  7. The tricuspid valve is normal in structure. Tricuspid valve  regurgitation is trivial.  8. Aortic valve regurgitation was not visualized by color flow Doppler.  Structurally normal aortic valve, with no evidence of sclerosis or  stenosis.  9. Mechanical prosthesis in the aortic valve position.  10. Sor[employer] Top-Hat bileaflet mechanical valve (size 21 mm, cat  # [id number], serial # [id number]).   Peak velocity: 2.77m/s, mean gradient 44mmHg.  11. The pulmonic valve was normal in structure. Pulmonic valve  regurgitation is not visualized by color flow Doppler.  12. Normal pulmonary artery systolic pressure.  13. The inferior vena cava is normal in size with greater than 50%  respiratory variability, suggesting right atrial pressure of 3 mmHg  Patient Profile  [name] is a 59 y.o. female with ESRD, rheumatic AS/MS s/p mechanical AVR/MVR, HTN, lupus, admitted 01/06/2020 with spontaneous RP bleed.   Assessment & Plan   1.  Acute RP Bleed    Still not clear why she had this   Surgery has signed off. Hgb is steady today at 8.3     Pt says that episode happened after she had  strained for bowel movement   Not clear cause  INR 3.6 on admit  Need to make sure on stool softener at the least    2. Mechanical MVR/AVR Hgb  is holding  Had bleed when INR was  3.6     INR is now 1      It appears small dose of heparin was given Saturday but then hgb dropped WIl start heparin conservatively  Follow H/H before committing to coumadin   3  Tachycardia  Pt remains tachycardic today, yesterday  (ST)   She is off b blocker but I am not sure that explains    WIll check TSH She has been anemic in past but not with this degree of tachycardia   Follow cautiously  She is getting dialyzed  ? Dry weight  Would do orhtostatics in AM    3  HTN    BP meds have been on hold   WIll resume low dose metoprolol as tachycardic     For questions or updates, please contact [place] Please consult [email or web address] for contact info under   Time Spent with Patient: I have spent a total of 15 minutes with patient reviewing hospital notes, telemetry, EKGs, labs and examining the patient as well as establishing an assessment and plan that was discussed with the patient.  > 50% of time was spent in direct patient care.    Signed, Addison Naegeli. Audie Box, [place]  01/11/2020 10:43 AM

## 2020-01-11 NOTE — Progress Notes (Signed)
ANTICOAGULATION CONSULT NOTE - Initial Consult  Pharmacy Consult for Heparin Indication: mech heart valves  No Known Allergies  Patient Measurements: Height: 5\' 6"  (167.6 cm) Weight: 66.4 kg (146 lb 6.2 oz) IBW/kg (Calculated) : 59.3 Heparin Dosing Weight: 65.1 kg  Vital Signs: Temp: 98.8 F (37.1 C) (08/23 0650) Temp Source: Oral (08/23 0650) BP: 120/80 (08/23 1030) Pulse Rate: 120 (08/23 1030)  Labs: Recent Labs    01/09/20 0414 01/09/20 1535 01/09/20 2348 01/09/20 2348 01/10/20 0527 01/11/20 0539 01/11/20 0540  HGB 7.2*   < > 7.8*   < > 8.0* 8.3*  --   HCT 22.2*   < > 24.2*  --  24.8* 25.3*  --   PLT 122*   < > 123*  --  134* 160  --   LABPROT 14.6  --   --   --  14.2 13.9  --   INR 1.2  --   --   --  1.2 1.1  --   CREATININE 3.54*  --   --   --  6.39*  --  8.58*   < > = values in this interval not displayed.    Estimated Creatinine Clearance: 6.8 mL/min (A) (by C-G formula based on SCr of 8.58 mg/dL (H)).   Medical History: Past Medical History:  Diagnosis Date  . Anemia   . Arthritis    "joints" (10/23/2017)  . Blood transfusion '08   [facility]; "low HgB" (10/23/2017)  . Chronic diastolic CHF (congestive heart failure) ([place])   . Claustrophobia   . Dysfunctional uterine bleeding 12/19/2010  . ESRD (end stage renal disease) on dialysis [facility])    "MWF; [name]." (10/23/2017)  . GERD (gastroesophageal reflux disease)   . Headache   . Hemodialysis patient [facility])    right extremity port  . History of hiatal hernia   . Hx of cardiovascular stress test    Lexiscan Myoview 4/16:  Normal stress nuclear study, EF 59%  . Hypertension   . Lupus ([place])    "? kind" (10/23/2017)  . Mitral stenosis    Echo 4/16:  EF 55-60%, no RWMA, Gr 1 DD, mod MS (mean 9 mmHg), mod LAE, mild RAE, PASP 65, mod to severe TR, trivial eff // Echo 6/19:  Mild LVH, EF 55-60, no RWMA, Gr 2 DD, mild to mod AS (Mean 20), severe MS (mean 17), massive LAE, PASP 48, trivial effusion   . Peptic ulcer  disease   . Pneumonia 10/21/2017  . S/P aortic valve replacement with metallic valve 10/25/3014   21 mm Sorin [employer] bileaflet mechanical valve  . S/P mitral valve replacement with metallic valve 0/02/9322   31 mm Sorin [employer] Optiform bileaflet mechanical valve  . Stroke [facility])    per patient "they said i had a small stroke but i couldnt even tell"  . Valvular heart disease (Aortic Stenosis and Mitral Stenosis) 09/25/2016   s/p mechanical AVR and MVR 11/2018 // Echo 02/2019: EF 65-70, mild LVH, mild LAE, normally functioning mechanical mitral valve prosthesis with no regurgitation or stenosis, trivial TR, normally functioning mechanical aortic valve prosthesis without regurgitation or stenosis      Assessment: 89 YOF on warfarin at home for mechanical aortic and mitral valves presented with a large retroperitoneal bleed in the setting of a supratherapeutic INR which required reversal with 53F-PCC and vit K. Patient remains stable off anticoag. IV heparin was briefly started on 8/21 but held shortly after for drop in Hgb. Discussed case with cardiologist  today who recommends resuming IV Heparin conservatively without bolus prior to start warfarin.   H/H slowly improved. Plt normalized today.  Goal of Therapy:  Heparin level 0.3-0.5 units/ml Monitor platelets by anticoagulation protocol: Yes   Plan:  -Restart IV heparin at 900 units/hr. No bolus in the setting of retroperitoneal bleed  -Low goal with recent RP bleed -Heparin level 8 hrs after starts -Daily HL and CBC -If patient tolerates heparin, will plan to restart warfarin   Albertina Parr, PharmD., BCPS, BCCCP Clinical Pharmacist Clinical phone for 01/11/20 until 3:30pm: [phone] If after 3:30pm, please refer to [facility] for unit-specific pharmacist

## 2020-01-11 NOTE — Progress Notes (Signed)
ANTICOAGULATION CONSULT NOTE  Pharmacy Consult for Heparin Indication: mechanical heart valves  No Known Allergies  Patient Measurements: Height: 5\' 6"  (167.6 cm) Weight: 63.6 kg (140 lb 3.4 oz) (stood to scale ) IBW/kg (Calculated) : 59.3 Heparin Dosing Weight: 61.7 kg  Vital Signs: Temp: 99.2 F (37.3 C) (08/23 2000) Temp Source: Oral (08/23 2000) BP: 130/84 (08/23 2000) Pulse Rate: 110 (08/23 2000)  Labs: Recent Labs    01/09/20 0414 01/09/20 1535 01/10/20 0527 01/10/20 0527 01/11/20 0539 01/11/20 0540 01/11/20 1940  HGB 7.2*   < > 8.0*   < > 8.3*  --  8.4*  HCT 22.2*   < > 24.8*  --  25.3*  --  26.5*  PLT 122*   < > 134*  --  160  --  172  LABPROT 14.6  --  14.2  --  13.9  --   --   INR 1.2  --  1.2  --  1.1  --   --   HEPARINUNFRC  --   --   --   --   --   --  0.17*  CREATININE 3.54*  --  6.39*  --   --  8.58*  --    < > = values in this interval not displayed.    Estimated Creatinine Clearance: 6.8 mL/min (A) (by C-G formula based on SCr of 8.58 mg/dL (H)).   Medical History: Past Medical History:  Diagnosis Date  . Anemia   . Arthritis    "joints" (10/23/2017)  . Blood transfusion '08   [facility]; "low HgB" (10/23/2017)  . Chronic diastolic CHF (congestive heart failure) ([place])   . Claustrophobia   . Dysfunctional uterine bleeding 12/19/2010  . ESRD (end stage renal disease) on dialysis [facility])    "MWF; [name]." (10/23/2017)  . GERD (gastroesophageal reflux disease)   . Headache   . Hemodialysis patient [facility])    right extremity port  . History of hiatal hernia   . Hx of cardiovascular stress test    Lexiscan Myoview 4/16:  Normal stress nuclear study, EF 59%  . Hypertension   . Lupus ([place])    "? kind" (10/23/2017)  . Mitral stenosis    Echo 4/16:  EF 55-60%, no RWMA, Gr 1 DD, mod MS (mean 9 mmHg), mod LAE, mild RAE, PASP 65, mod to severe TR, trivial eff // Echo 6/19:  Mild LVH, EF 55-60, no RWMA, Gr 2 DD, mild to mod AS (Mean 20), severe MS (mean 17),  massive LAE, PASP 48, trivial effusion   . Peptic ulcer disease   . Pneumonia 10/21/2017  . S/P aortic valve replacement with metallic valve 11/17/5282   21 mm Sorin [employer] bileaflet mechanical valve  . S/P mitral valve replacement with metallic valve 1/32/4401   31 mm Sorin [employer] Optiform bileaflet mechanical valve  . Stroke [facility])    per patient "they said i had a small stroke but i couldnt even tell"  . Valvular heart disease (Aortic Stenosis and Mitral Stenosis) 09/25/2016   s/p mechanical AVR and MVR 11/2018 // Echo 02/2019: EF 65-70, mild LVH, mild LAE, normally functioning mechanical mitral valve prosthesis with no regurgitation or stenosis, trivial TR, normally functioning mechanical aortic valve prosthesis without regurgitation or stenosis      Assessment: 23 YOF on warfarin at home for mechanical aortic and mitral valves presented with a large retroperitoneal bleed in the setting of a supratherapeutic INR which required reversal with 25F-PCC and vit K. Patient remains  stable off anticoag. IV heparin was briefly started on 8/21 but held shortly after for drop in Hgb. Discussed case with cardiologist today who recommends resuming IV heparin conservatively without bolus and ensuring CBC stable  prior to starting warfarin.   Initial heparin level low at 0.17 after heparin resumed. H/H slowly improved. Plt normalized and improved. No overt bleeding or issues with infusion per discussion with RN.  Goal of Therapy:  Heparin level 0.3-0.5 units/ml Monitor platelets by anticoagulation protocol: Yes   Plan:  Increase IV heparin to 1050 units/hr. No bolus in the setting of retroperitoneal bleed and targeting lower goal Check 8hr heparin level Monitor daily heparin level and CBC, s/sx bleeding If patient tolerates heparin, will plan to restart warfarin    Arturo Morton, PharmD, BCPS Please check AMION for all [place] contact numbers Clinical Pharmacist 01/11/2020 8:37  PM

## 2020-01-11 NOTE — Progress Notes (Signed)
NAME:  [name], MRN:  [id number], DOB:  July 28, 1960, LOS: 5 ADMISSION DATE:  01/06/2020, CONSULTATION DATE:  01/06/2020 REFERRING MD:  Quintella Reichert, MD CHIEF COMPLAINT:  Retroperitoneal hemorrhage   Brief History   Patient is a 59 year old female with history of ESRD on HD MWF, s/p aortic and mitral valve replacement on warfarin therapy who presented with nausea, nonbloody emesis and abdominal pain; found to have spontaneous left retroperitoneal bleed. She has received 1u pRBC and 2u FFP and vitamin K.   Past Medical History   Past Medical History:  Diagnosis Date  . Anemia   . Arthritis    "joints" (10/23/2017)  . Blood transfusion '08   [facility]; "low HgB" (10/23/2017)  . Chronic diastolic CHF (congestive heart failure) ([place])   . Claustrophobia   . Dysfunctional uterine bleeding 12/19/2010  . ESRD (end stage renal disease) on dialysis [facility])    "MWF; [name]." (10/23/2017)  . GERD (gastroesophageal reflux disease)   . Headache   . Hemodialysis patient [facility])    right extremity port  . History of hiatal hernia   . Hx of cardiovascular stress test    Lexiscan Myoview 4/16:  Normal stress nuclear study, EF 59%  . Hypertension   . Lupus ([place])    "? kind" (10/23/2017)  . Mitral stenosis    Echo 4/16:  EF 55-60%, no RWMA, Gr 1 DD, mod MS (mean 9 mmHg), mod LAE, mild RAE, PASP 65, mod to severe TR, trivial eff // Echo 6/19:  Mild LVH, EF 55-60, no RWMA, Gr 2 DD, mild to mod AS (Mean 20), severe MS (mean 17), massive LAE, PASP 48, trivial effusion   . Peptic ulcer disease   . Pneumonia 10/21/2017  . S/P aortic valve replacement with metallic valve 8/56/3149   21 mm Sorin [employer] bileaflet mechanical valve  . S/P mitral valve replacement with metallic valve 11/19/6376   31 mm Sorin [employer] Optiform bileaflet mechanical valve  . Stroke [facility])    per patient "they said i had a small stroke but i couldnt even tell"  . Valvular heart disease (Aortic Stenosis and Mitral Stenosis)  09/25/2016   s/p mechanical AVR and MVR 11/2018 // Echo 02/2019: EF 65-70, mild LVH, mild LAE, normally functioning mechanical mitral valve prosthesis with no regurgitation or stenosis, trivial TR, normally functioning mechanical aortic valve prosthesis without regurgitation or stenosis    Significant Hospital Events   8/18 > admitted to ICU  Consults:  [place] [facility] Nephrology Cardiology  Procedures:    Significant Diagnostic Tests:  CT Renal Stone 8/18 > large left sided retroperitoneal hematoma centered in the left renal fossa; diverticulosis without diverticulitis   Micro Data:  SARS CoV2 > Negative MRSA > Negative   Antimicrobials:  None   Interim history/subjective:  Tolerated HD this morning Hgb stable at 8.3  Endorses some gassy abdominal cramping No BM, but passing flatus  Objective   Blood pressure 127/76, pulse (!) 128, temperature 99.1 F (37.3 C), temperature source Oral, resp. rate (!) 23, height 5\' 6"  (1.676 m), weight 63.6 kg, last menstrual period 12/05/2010, SpO2 95 %.        Intake/Output Summary (Last 24 hours) at 01/11/2020 1621 Last data filed at 01/11/2020 1100 Gross per 24 hour  Intake 120 ml  Output 2000 ml  Net -1880 ml   Filed Weights   01/11/20 0500 01/11/20 0650 01/11/20 1122  Weight: 68.5 kg 66.4 kg 63.6 kg    Examination: General:  Chronically ill middle aged F, reclined in bed watching TV NAD  HENT: NCAT anicteric sclera pink mm  Lungs: CTA without crackles or wheeze. Even, unlabored on RA  Cardiovascular: RRR s1s2 no rgm cap refill < 3 seconds  Abdomen: Soft round nd normoactive x4.  Extremities: RUE AVF. No obvious joint deformity no cyanosis or clubbing  Neuro: AAOx4 following commands PERRLA  Skin: c/d/w without rash   Resolved Hospital Problem list    Assessment & Plan:   Acute blood loss anemia 2/2 spontaneous left retroperitoneal bleed:  -unclear why this occurred, pt relates this to a strained BM -baseline hgb  approx 8 with some hgb in 7s over past several months  -Hgb stable 8/23 (8.3 from 8.0) Plan: -trend H/H -- cautiously on heparin gtt  -if hgb drops, stop heparin and transfuse   Aortic and mitral stenosis s/p AVR and MVR Plan: -Heparin restarting with very cautious monitoring -Need to ensure stable H/H before considering coumadin -- appreciate cardiology following with [place] for anticoag readiness   ESRD  -HD per nephrology -Continue HD to maintain euvolemia.  QTc prolongation -Avoid QT prolonging drugs  Pain management -As needed morphine  Constipation Abdominal cramping -simethicone -senna -docusate  -encourage mobility  -minimize opioids as able   Best practice:  Diet: renal diet  Pain/Anxiety/Delirium protocol (if indicated): PRN morphine  VAP protocol (if indicated): n/a DVT prophylaxis: SCDs GI prophylaxis: PPI Glucose control: N/a Mobility: PT/OT  Code Status: FULL Family Communication: pt updated 8/23 all questions answered  Disposition: ICU    Eliseo Gum MSN, AGACNP-BC [place] [phone] If no answer, [phone] 01/11/2020, 4:21 PM

## 2020-01-11 NOTE — Progress Notes (Signed)
[facility] Progress Note    Assessment/ Plan:   1. Acute blood loss anemia, left sided RP hematoma. S/p ffp, prbc, and vit k -anticoagulation starting back this afternoon w/ IV heparin, INR down to 1 -would do serial h/h every 8 hrs, if continues to bleed may need IR to embolize, pt is aware, have d/w cardiology 2. 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 Leukocytosis: secondary to hematoma?  monitor for now 4. ESRD - usual HD is MWF. Had HD this am, next on Wed, no heparin 5. Secondary hyperparathyroidism. Limit phos in diet, sensipar and calcitriol (phos 5.4 today).  On Tums which is reasonable in the setting of indigestion and hyperphosphatemia 6. HTN - BP's are good, not too low or high, on home metoprolol . Closet to dry wt and no vol ^ on exam.  7. Nausea/vomiting - improving  OP HD: MWF HD 4hrs, F180, edw 61kg, 450/800, 2k, 2cal, 137na, 35bicarb, uf profile #2, hep 1800 bolus -sensipar 180mg  -calcitriol 57mcg -no esa, no iron  [name], MD 01/11/2020, 5:10 PM        Subjective:   Seen in room, Hb 8.3 last early today. Her abd pain (started 5d ago) is almost completely gone now.    Objective:   BP 127/76 (BP Location: Left Arm)   Pulse (!) 128   Temp 99.3 F (37.4 C) (Oral)   Resp (!) 23   Ht 5\' 6"  (1.676 m)   Wt 63.6 kg Comment: stood to scale   LMP 12/05/2010 (LMP Unknown)   SpO2 95%   BMI 22.63 kg/m   Intake/Output Summary (Last 24 hours) at 01/11/2020 1604 Last data filed at 01/11/2020 1100 Gross per 24 hour  Intake 120 ml  Output 2000 ml  Net -1880 ml   Weight change: 0.2 kg  Physical Exam: Gen: No acute distress, comfortable CVS: tachycardic, +murmur, loud S2 Resp:unlabored, bl chest expansion, clear to auscultation bilaterally ABd: abd firm, mild tenderness L side , no rebound Ext:no edema Neuro: awake, alert, speech clear and coherent Access: rue avf with +b/t  Imaging: No results  found.  Labs: BMET Recent Labs  Lab 01/06/20 0911 01/07/20 0350 01/08/20 0500 01/09/20 0414 01/10/20 0527 01/11/20 0540  NA 136 138 134* 135 132* 130*  K 3.9 4.5 3.8 3.5 3.7 3.9  CL 91* 92* 93* 93* 92* 88*  CO2 27 26 28 29 28 25   GLUCOSE 118* 95 94 97 80 82  BUN 63* 78* 30* 12 29* 49*  CREATININE 9.54* 10.76* 5.90* 3.54* 6.39* 8.58*  CALCIUM 9.3 9.2 9.2 8.3* 8.8* 9.1  PHOS  --  9.5* 8.1* 4.4 5.4* 5.7*   CBC Recent Labs  Lab 01/08/20 1542 01/09/20 0414 01/09/20 1535 01/09/20 2348 01/10/20 0527 01/11/20 0539  WBC 18.1*   < > 14.2* 13.4* 13.5* 11.3*  NEUTROABS 16.3*  --   --   --   --   --   HGB 7.8*   < > 6.7* 7.8* 8.0* 8.3*  HCT 23.1*   < > 20.4* 24.2* 24.8* 25.3*  MCV 93.1   < > 97.1 94.9 95.0 96.2  PLT 123*   < > 132* 123* 134* 160   < > = values in this interval not displayed.    Medications:    . calcitRIOL  2 mcg Oral Q M,W,F  . Chlorhexidine Gluconate Cloth  6 each Topical Daily  . mouth rinse  15 mL Mouth Rinse BID  .  metoprolol tartrate  25 mg Oral BID  . pantoprazole (PROTONIX) IV  40 mg Intravenous Daily  . sodium chloride flush  10-40 mL Intracatheter Q12H

## 2020-01-12 DIAGNOSIS — R000 Tachycardia, unspecified: Secondary | ICD-10-CM

## 2020-01-12 DIAGNOSIS — I10 Essential (primary) hypertension: Secondary | ICD-10-CM

## 2020-01-12 LAB — CBC
HCT: 26.9 % — ABNORMAL LOW (ref 36.0–46.0)
HCT: 26.9 % — ABNORMAL LOW (ref 36.0–46.0)
HCT: 27.7 % — ABNORMAL LOW (ref 36.0–46.0)
Hemoglobin: 8.7 g/dL — ABNORMAL LOW (ref 12.0–15.0)
Hemoglobin: 8.6 g/dL — ABNORMAL LOW (ref 12.0–15.0)
Hemoglobin: 8.8 g/dL — ABNORMAL LOW (ref 12.0–15.0)
MCH: 31.4 pg (ref 26.0–34.0)
MCH: 31.3 pg (ref 26.0–34.0)
MCH: 30.6 pg (ref 26.0–34.0)
MCHC: 32.3 g/dL (ref 30.0–36.0)
MCHC: 32 g/dL (ref 30.0–36.0)
MCHC: 31.8 g/dL (ref 30.0–36.0)
MCV: 97.1 fL (ref 80.0–100.0)
MCV: 97.8 fL (ref 80.0–100.0)
MCV: 96.2 fL (ref 80.0–100.0)
Platelets: 186 10*3/uL (ref 150–400)
Platelets: 201 10*3/uL (ref 150–400)
Platelets: 199 10*3/uL (ref 150–400)
RBC: 2.77 MIL/uL — ABNORMAL LOW (ref 3.87–5.11)
RBC: 2.75 MIL/uL — ABNORMAL LOW (ref 3.87–5.11)
RBC: 2.88 MIL/uL — ABNORMAL LOW (ref 3.87–5.11)
RDW: 15.8 % — ABNORMAL HIGH (ref 11.5–15.5)
RDW: 15.8 % — ABNORMAL HIGH (ref 11.5–15.5)
RDW: 15.6 % — ABNORMAL HIGH (ref 11.5–15.5)
WBC: 9.3 10*3/uL (ref 4.0–10.5)
WBC: 9.7 10*3/uL (ref 4.0–10.5)
WBC: 10.1 10*3/uL (ref 4.0–10.5)
nRBC: 0 % (ref 0.0–0.2)
nRBC: 0 % (ref 0.0–0.2)
nRBC: 0 % (ref 0.0–0.2)

## 2020-01-12 LAB — RENAL FUNCTION PANEL
Albumin: 2.6 g/dL — ABNORMAL LOW (ref 3.5–5.0)
Anion gap: 16 — ABNORMAL HIGH (ref 5–15)
BUN: 28 mg/dL — ABNORMAL HIGH (ref 6–20)
CO2: 27 mmol/L (ref 22–32)
Calcium: 9.5 mg/dL (ref 8.9–10.3)
Chloride: 92 mmol/L — ABNORMAL LOW (ref 98–111)
Creatinine, Ser: 5.27 mg/dL — ABNORMAL HIGH (ref 0.44–1.00)
GFR calc Af Amer: 10 mL/min — ABNORMAL LOW (ref >60)
GFR calc non Af Amer: 8 mL/min — ABNORMAL LOW (ref >60)
Glucose, Bld: 91 mg/dL (ref 70–99)
Phosphorus: 3.9 mg/dL (ref 2.5–4.6)
Potassium: 3.7 mmol/L (ref 3.5–5.1)
Sodium: 135 mmol/L (ref 135–145)

## 2020-01-12 LAB — HEPARIN LEVEL (UNFRACTIONATED)
Heparin Unfractionated: 0.26 IU/mL — ABNORMAL LOW (ref 0.30–0.70)
Heparin Unfractionated: 0.28 IU/mL — ABNORMAL LOW (ref 0.30–0.70)

## 2020-01-12 MED ORDER — SIMETHICONE 80 MG PO CHEW
80.0000 mg | CHEWABLE_TABLET | Freq: Three times a day (TID) | ORAL | Status: DC
  Administered 2020-01-12 – 2020-02-12 (×90): 80 mg via ORAL
  Filled 2020-01-12 (×89): qty 1

## 2020-01-12 MED ORDER — OXYCODONE-ACETAMINOPHEN 5-325 MG PO TABS
1.0000 | ORAL_TABLET | ORAL | Status: AC | PRN
  Administered 2020-01-12 – 2020-01-14 (×6): 1 via ORAL
  Filled 2020-01-12 (×6): qty 1

## 2020-01-12 NOTE — Progress Notes (Signed)
NAME:  [name], MRN:  [id number], DOB:  1960/06/19, LOS: 6 ADMISSION DATE:  01/06/2020, CONSULTATION DATE:  01/06/2020 REFERRING MD:  Quintella Reichert, MD CHIEF COMPLAINT:  Retroperitoneal hemorrhage   Brief History   Patient is a 59 year old female with history of ESRD on HD MWF, s/p aortic and mitral valve replacement on warfarin therapy who presented with nausea, nonbloody emesis and abdominal pain; found to have spontaneous left retroperitoneal bleed. She has received 1u pRBC and 2u FFP and vitamin K.   Past Medical History   Past Medical History:  Diagnosis Date  . Anemia   . Arthritis    "joints" (10/23/2017)  . Blood transfusion '08   [facility]; "low HgB" (10/23/2017)  . Chronic diastolic CHF (congestive heart failure) ([place])   . Claustrophobia   . Dysfunctional uterine bleeding 12/19/2010  . ESRD (end stage renal disease) on dialysis [facility])    "MWF; [name]." (10/23/2017)  . GERD (gastroesophageal reflux disease)   . Headache   . Hemodialysis patient [facility])    right extremity port  . History of hiatal hernia   . Hx of cardiovascular stress test    Lexiscan Myoview 4/16:  Normal stress nuclear study, EF 59%  . Hypertension   . Lupus ([place])    "? kind" (10/23/2017)  . Mitral stenosis    Echo 4/16:  EF 55-60%, no RWMA, Gr 1 DD, mod MS (mean 9 mmHg), mod LAE, mild RAE, PASP 65, mod to severe TR, trivial eff // Echo 6/19:  Mild LVH, EF 55-60, no RWMA, Gr 2 DD, mild to mod AS (Mean 20), severe MS (mean 17), massive LAE, PASP 48, trivial effusion   . Peptic ulcer disease   . Pneumonia 10/21/2017  . S/P aortic valve replacement with metallic valve 12/29/9145   21 mm Sorin [employer]omed[employer] bileaflet mechanical valve  . S/P mitral valve replacement with metallic valve 01/17/5620   31 mm Sorin [employer] Optiform bileaflet mechanical valve  . Stroke [facility])    per patient "they said i had a small stroke but i couldnt even tell"  . Valvular heart disease (Aortic Stenosis and Mitral Stenosis)  09/25/2016   s/p mechanical AVR and MVR 11/2018 // Echo 02/2019: EF 65-70, mild LVH, mild LAE, normally functioning mechanical mitral valve prosthesis with no regurgitation or stenosis, trivial TR, normally functioning mechanical aortic valve prosthesis without regurgitation or stenosis    Significant Hospital Events   8/18 > admitted to ICU  Consults:  [place] surgery Nephrology Cardiology  Procedures:    Significant Diagnostic Tests:  CT Renal Stone 8/18 > large left sided retroperitoneal hematoma centered in the left renal fossa; diverticulosis without diverticulitis   Micro Data:  SARS CoV2 > Negative MRSA > Negative   Antimicrobials:  None   Interim history/subjective:   Hgb remains stable on heparin gtt Increased flatus this morning -- feels like she might have a BM today   Objective   Blood pressure (!) 161/85, pulse (!) 106, temperature 98.6 F (37 C), temperature source Oral, resp. rate 18, height 5\' 6"  (1.676 m), weight 63.6 kg, last menstrual period 12/05/2010, SpO2 97 %.        Intake/Output Summary (Last 24 hours) at 01/12/2020 1001 Last data filed at 01/12/2020 0900 Gross per 24 hour  Intake 205.49 ml  Output 2000 ml  Net -1794.51 ml   Filed Weights   01/11/20 0500 01/11/20 0650 01/11/20 1122  Weight: 68.5 kg 66.4 kg 63.6 kg  Examination: General: WDWN middle aged F, reclined in bed NAD.  HENT: NCAT  Pink mmm anicteric sclera trachea midline  Lungs: CTA symmetrical chest expansion on RA.  Cardiovascular: RRR clear click. Cap refill brisk.  Abdomen: Soft round ndnt. Normoactive x4 Extremities: RUE AVF. No obvious joint deformity. No cyanosis or clubbing  Neuro: AAOx4 following commands PERRLA Skin: c/d/w without rash   Resolved Hospital Problem list    Assessment & Plan:   Acute blood loss anemia 2/2 spontaneous left retroperitoneal bleed, improving  -unclear why this occurred, pt relates this to a strained BM -baseline hgb approx 8  with some hgb in 7s over past several months  -Hgb remains stable Plan: -H/H q8hr -if hgb drops, stop heparin and transfuse   Aortic and mitral stenosis s/p AVR and MVR Plan: -d/w cards 8/24 -- cont heparin gtt  -appreciate pharmacist assistance with heparin monitoring/dosing  ESRD on MWF HD -HD per nephrology -Continue HD to maintain euvolemia  QTc prolongation -Avoid QT prolonging drugs   Pain management -Changing PRN morphine to PRN percocet   Constipation Abdominal cramping -simethicone -- will increase to TID  -senna -docusate  -encourage mobility  -minimize opioids as able   Best practice:  Diet: renal diet  Pain/Anxiety/Delirium protocol (if indicated): PRN percocet  VAP protocol (if indicated): n/a DVT prophylaxis: heparin gtt, SCD  GI prophylaxis: PPI Glucose control: N/a Mobility: PT/OT  Code Status: FULL Family Communication: pt updated 8/24, all question answered  Disposition: stable for transfer out of ICU -- will d/w TRH for pickup 8/25    Eliseo Gum MSN, AGACNP-BC [place] [phone] If no answer, [phone] 01/12/2020, 10:02 AM

## 2020-01-12 NOTE — Consult Note (Signed)
H&P Physician requesting consult: Rovi Agarwala  Chief Complaint: Left retroperitoneal bleed  History of Present Illness: 59 year old female with a history of end-stage renal disease on hemodialysis presented with abdominal pain, nausea, emesis.  CT scan on 8/18 revealed a left retroperitoneal bleed.  She received 1 unit of PRBC, 2 units of FFP and vitamin K.  She was on warfarin for history of aortic and mitral valve replacement.  Her hemoglobin has now stabilized and is back to baseline.  I was called to assess and make recommendations regarding starting back heparin/warfarin.  Patient has no complaints today.  Past Medical History:  Diagnosis Date  . Anemia   . Arthritis    "joints" (10/23/2017)  . Blood transfusion '08   [facility]; "low HgB" (10/23/2017)  . Chronic diastolic CHF (congestive heart failure) ([place])   . Claustrophobia   . Dysfunctional uterine bleeding 12/19/2010  . ESRD (end stage renal disease) on dialysis [facility])    "MWF; [name]." (10/23/2017)  . GERD (gastroesophageal reflux disease)   . Headache   . Hemodialysis patient [facility])    right extremity port  . History of hiatal hernia   . Hx of cardiovascular stress test    Lexiscan Myoview 4/16:  Normal stress nuclear study, EF 59%  . Hypertension   . Lupus ([place])    "? kind" (10/23/2017)  . Mitral stenosis    Echo 4/16:  EF 55-60%, no RWMA, Gr 1 DD, mod MS (mean 9 mmHg), mod LAE, mild RAE, PASP 65, mod to severe TR, trivial eff // Echo 6/19:  Mild LVH, EF 55-60, no RWMA, Gr 2 DD, mild to mod AS (Mean 20), severe MS (mean 17), massive LAE, PASP 48, trivial effusion   . Peptic ulcer disease   . Pneumonia 10/21/2017  . S/P aortic valve replacement with metallic valve 09/06/3788   21 mm Sorin [employer]omedics bileaflet mechanical valve  . S/P mitral valve replacement with metallic valve 2/40/9735   31 mm Sorin [employer]omedics Optiform bileaflet mechanical valve  . Stroke [facility])    per patient "they said i had a small stroke but i couldnt even  tell"  . Valvular heart disease (Aortic Stenosis and Mitral Stenosis) 09/25/2016   s/p mechanical AVR and MVR 11/2018 // Echo 02/2019: EF 65-70, mild LVH, mild LAE, normally functioning mechanical mitral valve prosthesis with no regurgitation or stenosis, trivial TR, normally functioning mechanical aortic valve prosthesis without regurgitation or stenosis    Past Surgical History:  Procedure Laterality Date  . AORTIC VALVE REPLACEMENT  12/02/2018   AORTIC VALVE REPLACEMENT (AVR) USING CARBOMEDICS SUPRA-ANNULAR TOP HAT SIZE 21MM (N/A)  . AORTIC VALVE REPLACEMENT N/A 12/02/2018   Procedure: AORTIC VALVE REPLACEMENT (AVR) USING CARBOMEDICS SUPRA-ANNULAR TOP HAT SIZE 21MM;  Surgeon: Rexene Alberts, MD;  Location: [place];  Service: Open Heart Surgery;  Laterality: N/A;  . AV FISTULA PLACEMENT Right 02/25/2018   Procedure: INSERTION OF 3mm x 16cm ARTEGRAFT;  Surgeon: Waynetta Sandy, MD;  Location: [place];  Service: Vascular;  Laterality: Right;  . COLONOSCOPY W/ BIOPSIES AND POLYPECTOMY    . DIALYSIS FISTULA CREATION  2007  . ENDOMETRIAL ABLATION    . ESOPHAGOGASTRODUODENOSCOPY N/A 07/31/2014   Procedure: ESOPHAGOGASTRODUODENOSCOPY (EGD);  Surgeon: Clarene Essex, MD;  Location: [facility] END[facility]COPY;  Service: Endoscopy;  Laterality: N/A;  . FISTULOGRAM Right 02/25/2018   Procedure: FISTULOGRAM ARM;  Surgeon: Waynetta Sandy, MD;  Location: [place];  Service: Vascular;  Laterality: Right;  . HEMATOMA EVACUATION Right 03/06/2018  Procedure: EVACUATION HEMATOMA RIGHT UPPER ARM;  Surgeon: Waynetta Sandy, MD;  Location: [place];  Service: Vascular;  Laterality: Right;  . INSERTION OF ARTERIOVENOUS (AV) ARTEGRAFT ARM Right 09/26/2017   Procedure: INSERTION OF ARTERIOVENOUS (AV) ARTEGRAFT INTO RIGHT ARM;  Surgeon: Waynetta Sandy, MD;  Location: [place];  Service: Vascular;  Laterality: Right;  . INSERTION OF DIALYSIS CATHETER Right 03/06/2018   Procedure: INSERTION OF DIALYSIS  CATHETER, right internal jugular;  Surgeon: Waynetta Sandy, MD;  Location: [place];  Service: Vascular;  Laterality: Right;  . IR THORACENTESIS ASP PLEURAL SPACE W/IMG GUIDE  01/07/2019  . MITRAL VALVE REPLACEMENT N/A 12/02/2018   Procedure: MITRAL VALVE (MV) REPLACEMENT USING CARBOMEDICS OPTIFORM SIZE 31MM;  Surgeon: Rexene Alberts, MD;  Location: [place];  Service: Open Heart Surgery;  Laterality: N/A;  . REVISON OF ARTERIOVENOUS FISTULA Right 09/26/2017   Procedure: REVISION OF ARTERIOVENOUS FISTULA RIGHT ARM WITH ARTEGRAFT;  Surgeon: Waynetta Sandy, MD;  Location: [place];  Service: Vascular;  Laterality: Right;  . REVISON OF ARTERIOVENOUS FISTULA Right 02/25/2018   Procedure: REVISION OF ARTERIOVENOUS FISTULA;  Surgeon: Waynetta Sandy, MD;  Location: [place];  Service: Vascular;  Laterality: Right;  . RIGHT/LEFT HEART CATH AND CORONARY ANGIOGRAPHY N/A 07/24/2018   Procedure: RIGHT/LEFT HEART CATH AND CORONARY ANGIOGRAPHY;  Surgeon: Larey Dresser, MD;  Location: [place] [facility];  Service: Cardiovascular;  Laterality: N/A;  . SHOULDER OPEN ROTATOR CUFF REPAIR Right 10/09/2016   Procedure: ROTATOR CUFF REPAIR SHOULDER OPEN partial acrominectomy and extensive synovectomy;  Surgeon: Latanya [name], MD;  Location: [facility];  Service: Orthopedics;  Laterality: Right;  RNFA  . TEE WITHOUT CARDIOVERSION N/A 01/02/2018   Procedure: TRANSESOPHAGEAL ECHOCARDIOGRAM (TEE) Bubble Study;  Surgeon: Larey Dresser, MD;  Location: [facility] END[facility]COPY;  Service: Cardiovascular;  Laterality: N/A;  . TEE WITHOUT CARDIOVERSION N/A 12/02/2018   Procedure: TRANSESOPHAGEAL ECHOCARDIOGRAM (TEE);  Surgeon: Rexene Alberts, MD;  Location: [place];  Service: Open Heart Surgery;  Laterality: N/A;  . TUBAL LIGATION      Home Medications:  Medications Prior to Admission  Medication Sig Dispense Refill Last Dose  . amoxicillin (AMOXIL) 500 MG capsule Take 4 capsules (2,000 mg total) by mouth once as needed  (1 hour prior to any dental appointment/procedure including cleaning). 8 capsule 4 unknown  . AURYXIA 1 GM 210 MG(Fe) tablet Take 840 mg by mouth 3 (three) times daily.   01/05/2020 at Unknown time  . warfarin (COUMADIN) 3 MG tablet Take 3 tablets daily except 2 tablets on Mondays and Fridays by mouth or as directed by [facility]. (Patient taking differently: Take 3 mg by mouth See admin instructions. Take 3 tablets daily except 2 tablets on Mondays by mouth or as directed by [facility].) 280 tablet 0 01/06/2020 at Unknown time  . amLODipine (NORVASC) 10 MG tablet Take 1 tablet (10 mg total) by mouth at bedtime. (Patient not taking: Reported on 01/06/2020) 30 tablet 4 Not Taking at Unknown time  . aspirin EC 81 MG tablet Take 81 mg by mouth at bedtime.   Unknown at Unknown time  . metoprolol tartrate (LOPRESSOR) 25 MG tablet Take 12.5 mg by mouth 2 (two) times daily.   Unknown at Unknown time  . multivitamin (RENA-VIT) TABS tablet Take 1 tablet by mouth at bedtime.    Unknown at Unknown time  . ondansetron (ZOFRAN) 4 MG tablet Take 4 mg by mouth every 6 (six) hours as needed for nausea or  vomiting.   Unknown at Unknown time  . pantoprazole (PROTONIX) 40 MG tablet Take 40 mg by mouth 2 (two) times daily.    Unknown at Unknown time   Allergies: No Known Allergies  Family History  Problem Relation Age of Onset  . Liver cancer Maternal Grandmother   . Lymphoma Maternal Aunt   . Hypertension Mother   . Breast cancer Mother   . Renal Disease Father   . Hypertension Father   . Heart attack Neg Hx    Social History:  reports that she quit smoking about 13 months ago. Her smoking use included cigarettes. She started smoking about 2 years ago. She has a 1.00 pack-year smoking history. She has never used smokeless tobacco. She reports current alcohol use of about 1.0 standard drink of alcohol per week. She reports previous drug use. Drugs: Marijuana and Cocaine.  ROS: A complete review of  systems was performed.  All systems are negative except for pertinent findings as noted. ROS   Physical Exam:  Vital signs in last 24 hours: Temp:  [98.6 F (37 C)-99.2 F (37.3 C)] 98.7 F (37.1 C) (08/24 1505) Pulse Rate:  [100-111] 101 (08/24 1505) Resp:  [9-24] 17 (08/24 1505) BP: (115-161)/(68-98) 135/85 (08/24 1505) SpO2:  [93 %-100 %] 100 % (08/24 1505) General:  Alert and oriented, No acute distress HEENT: Normocephalic, atraumatic Neck: No JVD or lymphadenopathy Cardiovascular: Regular rate and rhythm Lungs: Regular rate and effort Abdomen: Soft, nontender, nondistended, no abdominal masses Back: No CVA tenderness Extremities: No edema Neurologic: Grossly intact  Laboratory Data:  Results for orders placed or performed during the hospital encounter of 01/06/20 (from the past 24 hour(s))  Heparin level (unfractionated)     Status: Abnormal   Collection Time: 01/11/20  7:40 PM  Result Value Ref Range   Heparin Unfractionated 0.17 (L) 0.30 - 0.70 IU/mL  CBC     Status: Abnormal   Collection Time: 01/11/20  7:40 PM  Result Value Ref Range   WBC 9.6 4.0 - 10.5 K/uL   RBC 2.74 (L) 3.87 - 5.11 MIL/uL   Hemoglobin 8.4 (L) 12.0 - 15.0 g/dL   HCT 26.5 (L) 36 - 46 %   MCV 96.7 80.0 - 100.0 fL   MCH 30.7 26.0 - 34.0 pg   MCHC 31.7 30.0 - 36.0 g/dL   RDW 16.1 (H) 11.5 - 15.5 %   Platelets 172 150 - 400 K/uL   nRBC 0.0 0.0 - 0.2 %  Renal function panel     Status: Abnormal   Collection Time: 01/12/20  4:36 AM  Result Value Ref Range   Sodium 135 135 - 145 mmol/L   Potassium 3.7 3.5 - 5.1 mmol/L   Chloride 92 (L) 98 - 111 mmol/L   CO2 27 22 - 32 mmol/L   Glucose, Bld 91 70 - 99 mg/dL   BUN 28 (H) 6 - 20 mg/dL   Creatinine, Ser 5.27 (H) 0.44 - 1.00 mg/dL   Calcium 9.5 8.9 - 10.3 mg/dL   Phosphorus 3.9 2.5 - 4.6 mg/dL   Albumin 2.6 (L) 3.5 - 5.0 g/dL   GFR calc non Af Amer 8 (L) >60 mL/min   GFR calc Af Amer 10 (L) >60 mL/min   Anion gap 16 (H) 5 - 15  Heparin level  (unfractionated)     Status: Abnormal   Collection Time: 01/12/20  4:36 AM  Result Value Ref Range   Heparin Unfractionated 0.26 (L) 0.30 - 0.70 IU/mL  CBC     Status: Abnormal   Collection Time: 01/12/20  7:49 AM  Result Value Ref Range   WBC 9.3 4.0 - 10.5 K/uL   RBC 2.77 (L) 3.87 - 5.11 MIL/uL   Hemoglobin 8.7 (L) 12.0 - 15.0 g/dL   HCT 26.9 (L) 36 - 46 %   MCV 97.1 80.0 - 100.0 fL   MCH 31.4 26.0 - 34.0 pg   MCHC 32.3 30.0 - 36.0 g/dL   RDW 15.8 (H) 11.5 - 15.5 %   Platelets 186 150 - 400 K/uL   nRBC 0.0 0.0 - 0.2 %  CBC     Status: Abnormal   Collection Time: 01/12/20  2:22 PM  Result Value Ref Range   WBC 9.7 4.0 - 10.5 K/uL   RBC 2.75 (L) 3.87 - 5.11 MIL/uL   Hemoglobin 8.6 (L) 12.0 - 15.0 g/dL   HCT 26.9 (L) 36 - 46 %   MCV 97.8 80.0 - 100.0 fL   MCH 31.3 26.0 - 34.0 pg   MCHC 32.0 30.0 - 36.0 g/dL   RDW 15.8 (H) 11.5 - 15.5 %   Platelets 201 150 - 400 K/uL   nRBC 0.0 0.0 - 0.2 %  Heparin level (unfractionated)     Status: Abnormal   Collection Time: 01/12/20  2:22 PM  Result Value Ref Range   Heparin Unfractionated 0.28 (L) 0.30 - 0.70 IU/mL   Recent Results (from the past 240 hour(s))  SARS Coronavirus 2 by RT PCR (hospital order, performed in [place] hospital lab) Nasopharyngeal Nasopharyngeal Swab     Status: None   Collection Time: 01/06/20  5:20 PM   Specimen: Nasopharyngeal Swab  Result Value Ref Range Status   SARS Coronavirus 2 NEGATIVE NEGATIVE Final    Comment: (NOTE) SARS-CoV-2 target nucleic acids are NOT DETECTED.  The SARS-CoV-2 RNA is generally detectable in upper and lower respiratory specimens during the acute phase of infection. The lowest concentration of SARS-CoV-2 viral copies this assay can detect is 250 copies / mL. A negative result does not preclude SARS-CoV-2 infection and should not be used as the sole basis for treatment or other patient management decisions.  A negative result may occur with improper specimen collection /  handling, submission of specimen other than nasopharyngeal swab, presence of viral mutation(s) within the areas targeted by this assay, and inadequate number of viral copies (<250 copies / mL). A negative result must be combined with clinical observations, patient history, and epidemiological information.  Fact Sheet for Patients:   StrictlyIdeas.no  Fact Sheet for Healthcare Providers: BankingDealers.co.za  This test is not yet approved or  cleared by the [place] FDA and has been authorized for detection and/or diagnosis of SARS-CoV-2 by FDA under an Emergency Use Authorization (EUA).  This EUA will remain in effect (meaning this test can be used) for the duration of the COVID-19 declaration under Section 564(b)(1) of the Act, 21 U.S.C. section 360bbb-3(b)(1), unless the authorization is terminated or revoked sooner.  Performed at [facility], [place] [address], [place], [place] [address]   MRSA PCR Screening     Status: None   Collection Time: 01/07/20  3:53 AM   Specimen: Nasal Mucosa; Nasopharyngeal  Result Value Ref Range Status   MRSA by PCR NEGATIVE NEGATIVE Final    Comment:        The GeneXpert MRSA Assay (FDA approved for NASAL specimens only), is one component of a comprehensive MRSA colonization surveillance program. It is  not intended to diagnose MRSA infection nor to guide or monitor treatment for MRSA infections. Performed at [facility], [place] [address], [place], [place] [address]    Creatinine: Recent Labs    01/06/20 0911 01/07/20 0350 01/08/20 0500 01/09/20 0414 01/10/20 0527 01/11/20 0540 01/12/20 0436  CREATININE 9.54* 10.76* 5.90* 3.54* 6.39* 8.58* 5.27*   CT scan personally reviewed and is detailed in the history of present illness  Impression/Assessment:  Left retroperitoneal bleed Acute on chronic blood loss anemia  Plan:  Hemoglobin has stabilized.  She is in a closely  monitored setting.  Continue to follow hemoglobin as he restart heparin.  If hemoglobin begins to drop, recommend CTA of the abdomen.  If there are signs of repeat retroperitoneal bleed, recommend interventional radiology consultation for embolization of that left kidney.  If hemoglobin remains stable, she should be able to go back on warfarin.  This was discussed with the patient.  She understands the potential for increased risk of bleeding.  Marton Redwood, III 01/12/2020, 4:04 PM

## 2020-01-12 NOTE — Progress Notes (Signed)
ANTICOAGULATION CONSULT NOTE - Follow Up Consult  Pharmacy Consult for Heparin Indication: mech heart valves  No Known Allergies  Patient Measurements: Height: 5\' 6"  (167.6 cm) Weight: 63.6 kg (140 lb 3.4 oz) (stood to scale ) IBW/kg (Calculated) : 59.3 Heparin Dosing Weight: 65.1 kg  Vital Signs: Temp: 99 F (37.2 C) (08/24 0400) Temp Source: Oral (08/24 0400) BP: 161/85 (08/24 0500) Pulse Rate: 108 (08/24 0500)  Labs: Recent Labs    01/10/20 0527 01/10/20 0527 01/11/20 0539 01/11/20 0539 01/11/20 0540 01/11/20 1940 01/12/20 0436 01/12/20 0749  HGB 8.0*   < > 8.3*   < >  --  8.4*  --  8.7*  HCT 24.8*   < > 25.3*  --   --  26.5*  --  26.9*  PLT 134*   < > 160  --   --  172  --  186  LABPROT 14.2  --  13.9  --   --   --   --   --   INR 1.2  --  1.1  --   --   --   --   --   HEPARINUNFRC  --   --   --   --   --  0.17* 0.26*  --   CREATININE 6.39*  --   --   --  8.58*  --  5.27*  --    < > = values in this interval not displayed.    Estimated Creatinine Clearance: 11 mL/min (A) (by C-G formula based on SCr of 5.27 mg/dL (H)).   Medical History: Past Medical History:  Diagnosis Date  . Anemia   . Arthritis    "joints" (10/23/2017)  . Blood transfusion '08   [facility]; "low HgB" (10/23/2017)  . Chronic diastolic CHF (congestive heart failure) ([place])   . Claustrophobia   . Dysfunctional uterine bleeding 12/19/2010  . ESRD (end stage renal disease) on dialysis [facility])    "MWF; [name]." (10/23/2017)  . GERD (gastroesophageal reflux disease)   . Headache   . Hemodialysis patient [facility])    right extremity port  . History of hiatal hernia   . Hx of cardiovascular stress test    Lexiscan Myoview 4/16:  Normal stress nuclear study, EF 59%  . Hypertension   . Lupus ([place])    "? kind" (10/23/2017)  . Mitral stenosis    Echo 4/16:  EF 55-Connie%, no RWMA, Gr 1 DD, mod MS (mean 9 mmHg), mod LAE, mild RAE, PASP 65, mod to severe TR, trivial eff // Echo 6/19:  Mild LVH, EF 55-Connie, no RWMA,  Gr 2 DD, mild to mod AS (Mean 20), severe MS (mean 17), massive LAE, PASP 48, trivial effusion   . Peptic ulcer disease   . Pneumonia 10/21/2017  . S/P aortic valve replacement with metallic valve 3/54/5625   21 mm Sorin [employer]omedics bileaflet mechanical valve  . S/P mitral valve replacement with metallic valve 6/38/9373   31 mm Sorin [employer] Optiform bileaflet mechanical valve  . Stroke [facility])    per patient "they said i had a small stroke but i couldnt even tell"  . Valvular heart disease (Aortic Stenosis and Mitral Stenosis) 09/25/2016   s/p mechanical AVR and MVR 11/2018 // Echo 02/2019: EF 65-70, mild LVH, mild LAE, normally functioning mechanical mitral valve prosthesis with no regurgitation or stenosis, trivial TR, normally functioning mechanical aortic valve prosthesis without regurgitation or stenosis      Assessment: [name] on warfarin at home  for mechanical aortic and mitral valves presented with a large retroperitoneal bleed in the setting of a supratherapeutic INR which required reversal with 23F-PCC and vit K. Patient remains stable off anticoag. IV heparin was briefly started on 8/21 but held shortly after for drop in Hgb. Discussed case with cardiologist who recommends resuming IV Heparin conservatively without bolus prior to start warfarin.   Afternoon HL is subtherapeutic at 0.28 on 1150 units/hr. H/H low stable. Plt remain wnl. No s/s of bleeding noted per RN   Goal of Therapy:  Heparin level 0.3-0.5 units/ml Monitor platelets by anticoagulation protocol: Yes   Plan:  -Increase IV heparin to 1250 units/hr  -Low goal with recent RP bleed -Heparin level in 8 hrs -Daily HL and CBC -If patient tolerates heparin in goal range, will plan to restart warfarin per cardiology   Albertina Parr, PharmD., BCPS, BCCCP Clinical Pharmacist Clinical phone for 01/12/20 until 3:30pm: [phone] If after 3:30pm, please refer to [facility] for unit-specific pharmacist

## 2020-01-12 NOTE — Progress Notes (Signed)
Cardiology Progress Note  Patient ID: [name] MRN: [id number] DOB: 1961/03/19 Date of Encounter: 01/12/2020  Primary Cardiologist: Dorris Carnes, MD  Subjective   Pt feeling OK   Breathing is OK   NO CP   Inpatient Medications  Scheduled Meds: . calcitRIOL  2 mcg Oral Q M,W,F  . Chlorhexidine Gluconate Cloth  6 each Topical Daily  . docusate sodium  100 mg Oral Daily  . mouth rinse  15 mL Mouth Rinse BID  . metoprolol tartrate  25 mg Oral BID  . pantoprazole (PROTONIX) IV  40 mg Intravenous Daily  . senna  1 tablet Oral Daily  . simethicone  80 mg Oral BID  . sodium chloride flush  10-40 mL Intracatheter Q12H   Continuous Infusions: . heparin 1,150 Units/hr (01/12/20 0557)   PRN Meds: calcium carbonate, morphine injection, ondansetron, sodium chloride flush   Vital Signs   Vitals:   01/12/20 0200 01/12/20 0300 01/12/20 0400 01/12/20 0500  BP: 129/68 (!) 146/98 (!) 115/92 (!) 161/85  Pulse: (!) 102 100 (!) 102 (!) 108  Resp: (!) 9 16 16  (!) 24  Temp:   99 F (37.2 C)   TempSrc:   Oral   SpO2: 93% 98% 96% 97%  Weight:      Height:        Intake/Output Summary (Last 24 hours) at 01/12/2020 0704 Last data filed at 01/12/2020 0400 Gross per 24 hour  Intake 149.44 ml  Output 2000 ml  Net -1850.56 ml   Last 3 Weights 01/11/2020 01/11/2020 01/11/2020  Weight (lbs) 140 lb 3.4 oz 146 lb 6.2 oz 151 lb 0.2 oz  Weight (kg) 63.6 kg 66.4 kg 68.5 kg      Telemetry  Sinus tachycardia  100s  - personally reviewed.   ECG   Physical Exam   Vitals:   01/12/20 0200 01/12/20 0300 01/12/20 0400 01/12/20 0500  BP: 129/68 (!) 146/98 (!) 115/92 (!) 161/85  Pulse: (!) 102 100 (!) 102 (!) 108  Resp: (!) 9 16 16  (!) 24  Temp:   99 F (37.2 C)   TempSrc:   Oral   SpO2: 93% 98% 96% 97%  Weight:      Height:         Intake/Output Summary (Last 24 hours) at 01/12/2020 0704 Last data filed at 01/12/2020 0400 Gross per 24 hour  Intake 149.44 ml  Output 2000 ml  Net  -1850.56 ml    Last 3 Weights 01/11/2020 01/11/2020 01/11/2020  Weight (lbs) 140 lb 3.4 oz 146 lb 6.2 oz 151 lb 0.2 oz  Weight (kg) 63.6 kg 66.4 kg 68.5 kg    Body mass index is 22.63 kg/m.   General: Thin 59 yo in NAD Head: Atraumatic, normal size  Eyes: PEERLA, EOMI  Neck: JVP is normal   Cardiac: Normal S1, S2; tachycardia,  Crisp valve sounds  Lungs: Clear to auscultation bilaterally  No rales  Abd: Soft, nontender, no hepatomegaly  Ext: No edema,  Labs  High Sensitivity Troponin:  No results for input(s): TROPONINIHS in the last 720 hours.   Cardiac EnzymesNo results for input(s): TROPONINI in the last 168 hours. No results for input(s): TROPIPOC in the last 168 hours.  Chemistry Recent Labs  Lab 01/06/20 0911 01/06/20 0911 01/07/20 0350 01/08/20 0500 01/10/20 0527 01/11/20 0540 01/12/20 0436  NA 136   < > 138   < > 132* 130* 135  K 3.9   < > 4.5   < >  3.7 3.9 3.7  CL 91*   < > 92*   < > 92* 88* 92*  CO2 27   < > 26   < > 28 25 27   GLUCOSE 118*   < > 95   < > 80 82 91  BUN 63*   < > 78*   < > 29* 49* 28*  CREATININE 9.54*   < > 10.76*   < > 6.39* 8.58* 5.27*  CALCIUM 9.3   < > 9.2   < > 8.8* 9.1 9.5  PROT 8.4*  --  7.1  --   --   --   --   ALBUMIN 3.5   < > 3.3*   < > 2.6* 2.6* 2.6*  AST 28  --  30  --   --   --   --   ALT 19  --  17  --   --   --   --   ALKPHOS 110  --  80  --   --   --   --   BILITOT 0.5  --  0.2*  --   --   --   --   GFRNONAA 4*   < > 4*   < > 7* 5* 8*  GFRAA 5*   < > 4*   < > 8* 5* 10*  ANIONGAP 18*   < > 20*   < > 12 17* 16*   < > = values in this interval not displayed.    Hematology Recent Labs  Lab 01/10/20 0527 01/11/20 0539 01/11/20 1940  WBC 13.5* 11.3* 9.6  RBC 2.61* 2.63* 2.74*  HGB 8.0* 8.3* 8.4*  HCT 24.8* 25.3* 26.5*  MCV 95.0 96.2 96.7  MCH 30.7 31.6 30.7  MCHC 32.3 32.8 31.7  RDW 16.9* 16.3* 16.1*  PLT 134* 160 172   BNPNo results for input(s): BNP, PROBNP in the last 168 hours.  DDimer No results for input(s):  DDIMER in the last 168 hours.   Radiology  No results found.  Cardiac Studies   TTE 03/12/2019 1. Left ventricular ejection fraction, by visual estimation, is 65 to  70%. The left ventricle has normal function. Normal left ventricular size.  There is mildly increased left ventricular hypertrophy.  2. Global right ventricle has normal systolic function.The right  ventricular size is normal. No increase in right ventricular wall  thickness.  3. Left atrial size was mildly dilated.  4. Right atrial size was normal.  5. The mitral valve has been repaired/replaced. No evidence of mitral  valve regurgitation. No evidence of mitral stenosis.  6. [employer] Optiform bileaflet mechanical valve (size 31 mm, cat  # [id number], serial # [id number]).   Mean gradient 60mmHg.  7. The tricuspid valve is normal in structure. Tricuspid valve  regurgitation is trivial.  8. Aortic valve regurgitation was not visualized by color flow Doppler.  Structurally normal aortic valve, with no evidence of sclerosis or  stenosis.  9. Mechanical prosthesis in the aortic valve position.  10. [employer] Top-Hat bileaflet mechanical valve (size 21 mm, cat  # [id number], serial # [id number]).   Peak velocity: 2.51m/s, mean gradient 102mmHg.  11. The pulmonic valve was normal in structure. Pulmonic valve  regurgitation is not visualized by color flow Doppler.  12. Normal pulmonary artery systolic pressure.  13. The inferior vena cava is normal in size with greater than 50%  respiratory variability, suggesting right atrial pressure of 3 mmHg  Patient Profile  [name] is a 59 y.o. female with ESRD, rheumatic AS/MS s/p mechanical AVR/MVR, HTN, lupus, admitted 01/06/2020 with spontaneous RP bleed.   Assessment & Plan   1. Acute RP Bleed    Still not clear why she had this  Reviewed with radiology   Pt at increased risk with cysts/dialysis for renal tumors   Urology has been consulted For  now heparin has been restarted and Hgb is holding      Follow    2. Mechanical MVR/AVR Currently on heparin IV    FOllow CBC    3  Tachycardia   PT's HR is improved from yesterday   Still mildly increased   Follow for now   Follow CBC   TSH is normal   3  HTN   BP is up this am     She is onhigher than home dose of metoprolol  FOllow for now  COntinue   For questions or updates, please contact [facility] Please consult [email or web address] for contact info under   Time Spent with Patient: I have spent a total of 15 minutes with patient reviewing hospital notes, telemetry, EKGs, labs and examining the patient as well as establishing an assessment and plan that was discussed with the patient.  > 50% of time was spent in direct patient care.    Signed, Addison Naegeli. Audie Box, [place]  01/12/2020 7:04 AM

## 2020-01-12 NOTE — Evaluation (Signed)
Physical Therapy Evaluation Patient Details Name: [name] MRN: [id number] DOB: 05-19-1961 Today's Date: 01/12/2020   History of Present Illness  Patient is a 59 year old female with history of ESRD on HD MWF, s/p aortic and mitral valve replacement on warfarin therapy who presented with nausea, nonbloody emesis and abdominal pain; found to have spontaneous left retroperitoneal bleed.    Clinical Impression  Pt admitted with above. Pt functioning near baseline however activity limited by L flank soreness and n/v. Pt with + vomiting upon sitting up but was then able to amb around RN station with min guard assist. Pt with good home set up and support. Acute PT to monitor pt while in hospital to promote mobility but don't anticipate pt to need therapy services s/p d/c.     Follow Up Recommendations No PT follow up;Supervision - Intermittent    Equipment Recommendations  None recommended by PT    Recommendations for Other Services       Precautions / Restrictions Precautions Precautions: Fall Precaution Comments: easily nauseated Restrictions Weight Bearing Restrictions: No      Mobility  Bed Mobility Overal bed mobility: Modified Independent             General bed mobility comments: HOB elevated, became nauseated when getting up and vomitted bile  Transfers Overall transfer level: Needs assistance Equipment used: None Transfers: Sit to/from [facility] Sit to Stand: Supervision Stand pivot transfers: Supervision       General transfer comment: supervision for lines  Ambulation/Gait Ambulation/Gait assistance: Min guard Gait Distance (Feet): 350 Feet Assistive device: None Gait Pattern/deviations: Step-through pattern;Decreased stride length Gait velocity: decreased compared to normal   General Gait Details: slower than normal, minimal trunk flexion, no episode of LOB  Stairs            Wheelchair Mobility    Modified Rankin  (Stroke Patients Only)       Balance Overall balance assessment: No apparent balance deficits (not formally assessed)                                           Pertinent Vitals/Pain Pain Assessment: No/denies pain    Home Living Family/patient expects to be discharged to:: Private residence Living Arrangements: Children Available Help at Discharge: Family;Available 24 hours/day (dtr works but has other family/friends that can help) Type of Home: Apartment Home Access: Level entry     Home Layout: One level Home Equipment: [place] - 2 wheels;Shower seat      Prior Function Level of Independence: Independent         Comments: drove self to DIA     Hand Dominance   Dominant Hand: Right    Extremity/Trunk Assessment   Upper Extremity Assessment Upper Extremity Assessment: Overall WFL for tasks assessed    Lower Extremity Assessment Lower Extremity Assessment: Overall WFL for tasks assessed    Cervical / Trunk Assessment Cervical / Trunk Assessment: Normal  Communication   Communication: No difficulties  Cognition Arousal/Alertness: Awake/alert Behavior During Therapy: WFL for tasks assessed/performed Overall Cognitive Status: Within Functional Limits for tasks assessed                                        General Comments General comments (skin integrity, edema, etc.): pt  with nausea and +vomiting upon sitting up EOB    Exercises     Assessment/Plan    PT Assessment Patient needs continued PT services  PT Problem List Decreased strength;Decreased activity tolerance;Decreased balance;Decreased mobility       PT Treatment Interventions Gait training;Stair training;Functional mobility training;Therapeutic activities;Therapeutic exercise;Balance training    PT Goals (Current goals can be found in the Care Plan section)  Acute Rehab PT Goals Patient Stated Goal: stop vomitting PT Goal Formulation: With patient Time  For Goal Achievement: 01/26/20 Potential to Achieve Goals: Good    Frequency Min 2X/week   Barriers to discharge        Co-evaluation               AM-PAC PT "6 Clicks" Mobility  Outcome Measure Help needed turning from your back to your side while in a flat bed without using bedrails?: None Help needed moving from lying on your back to sitting on the side of a flat bed without using bedrails?: None Help needed moving to and from a bed to a chair (including a wheelchair)?: None Help needed standing up from a chair using your arms (e.g., wheelchair or bedside chair)?: None Help needed to walk in hospital room?: None Help needed climbing 3-5 steps with a railing? : A Little 6 Click Score: 23    End of Session Equipment Utilized During Treatment: Gait belt Activity Tolerance: Patient tolerated treatment well Patient left: in chair;with call bell/phone within reach Nurse Communication: Mobility status (+ vomiting) PT Visit Diagnosis: Muscle weakness (generalized) (M62.81)    Time: 4098-1191 PT Time Calculation (min) (ACUTE ONLY): 21 min   Charges:   PT Evaluation $PT Eval Low Complexity: 1 Low          Kittie Plater, PT, DPT Acute Rehabilitation Services Pager #: [phone] Office #: [phone]   Berline Lopes 01/12/2020, 1:36 PM

## 2020-01-12 NOTE — Progress Notes (Signed)
[name] Progress Note    Assessment/ Plan:   1. Acute blood loss anemia, left sided RP hematoma by CT 8/18. S/p ffp, prbc, and vit k on admit, INR normalized - IV heparin restarted 8/21 (due to #2) then stopped 8/22 and given more prbc's (4 prbc total here), then IV hep restarted again yest 8/23, CBC ordered q 8hr - pt symptoms remain improved and Hb up 8.7 this am, stable on IV hep 2. 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 ESRD - usual HD is MWF. HD tomorrow.  4. Secondary hyperparathyroidism. Limit phos in diet, sensipar and calcitriol (phos 5.4 today).  On Tums which is reasonable in the setting of indigestion and hyperphosphatemia 5. HTN - BP's are good, on home metoprolol . Close to dry wt and no vol ^ on exam.   6. Nausea/vomiting - improved  OP HD: MWF HD  4h  61kg  450/800  2/2 bath  P2  Hep 1800  -sensipar 180mg  -calcitriol 38mcg -no esa, no iron  [name], MD 01/12/2020, 9:26 AM        Subjective:   ABd pain remains resolved, Hb up 8.7 this am.  No new c/o. s   Objective:   BP (!) 161/85   Pulse (!) 106   Temp 98.6 F (37 C) (Oral)   Resp 18   Ht 5\' 6"  (1.676 m)   Wt 63.6 kg Comment: stood to scale   LMP 12/05/2010 (LMP Unknown)   SpO2 97%   BMI 22.63 kg/m   Intake/Output Summary (Last 24 hours) at 01/12/2020 9024 Last data filed at 01/12/2020 0900 Gross per 24 hour  Intake 205.49 ml  Output 2000 ml  Net -1794.51 ml   Weight change: -4.9 kg  Physical Exam: Gen: No acute distress, comfortable CVS: tachycardic, +murmur, loud S2 Resp:unlabored, bl chest expansion, clear to auscultation bilaterally ABd: abd firm, mild tenderness L side , no rebound Ext:no edema Neuro: awake, alert, speech clear and coherent Access: rue avf with +b/t  Imaging: No results found.  Labs: BMET Recent Labs  Lab 01/06/20 0911 01/07/20 0350 01/08/20 0500 01/09/20 0414 01/10/20 0527 01/11/20 0540 01/12/20 0436   NA 136 138 134* 135 132* 130* 135  K 3.9 4.5 3.8 3.5 3.7 3.9 3.7  CL 91* 92* 93* 93* 92* 88* 92*  CO2 27 26 28 29 28 25 27   GLUCOSE 118* 95 94 97 80 82 91  BUN 63* 78* 30* 12 29* 49* 28*  CREATININE 9.54* 10.76* 5.90* 3.54* 6.39* 8.58* 5.27*  CALCIUM 9.3 9.2 9.2 8.3* 8.8* 9.1 9.5  PHOS  --  9.5* 8.1* 4.4 5.4* 5.7* 3.9   CBC Recent Labs  Lab 01/08/20 1542 01/09/20 0414 01/10/20 0527 01/11/20 0539 01/11/20 1940 01/12/20 0749  WBC 18.1*   < > 13.5* 11.3* 9.6 9.3  NEUTROABS 16.3*  --   --   --   --   --   HGB 7.8*   < > 8.0* 8.3* 8.4* 8.7*  HCT 23.1*   < > 24.8* 25.3* 26.5* 26.9*  MCV 93.1   < > 95.0 96.2 96.7 97.1  PLT 123*   < > 134* 160 172 186   < > = values in this interval not displayed.    Medications:    . calcitRIOL  2 mcg Oral Q M,W,F  . Chlorhexidine Gluconate Cloth  6 each Topical Daily  . docusate sodium  100 mg Oral Daily  .  mouth rinse  15 mL Mouth Rinse BID  . metoprolol tartrate  25 mg Oral BID  . pantoprazole (PROTONIX) IV  40 mg Intravenous Daily  . senna  1 tablet Oral Daily  . simethicone  80 mg Oral BID  . sodium chloride flush  10-40 mL Intracatheter Q12H

## 2020-01-12 NOTE — Progress Notes (Signed)
ANTICOAGULATION CONSULT NOTE - Follow Up Consult  Pharmacy Consult for heparin Indication: MVR/AVR  Labs: Recent Labs    01/10/20 0527 01/10/20 0527 01/11/20 0539 01/11/20 0540 01/11/20 1940 01/12/20 0436  HGB 8.0*   < > 8.3*  --  8.4*  --   HCT 24.8*  --  25.3*  --  26.5*  --   PLT 134*  --  160  --  172  --   LABPROT 14.2  --  13.9  --   --   --   INR 1.2  --  1.1  --   --   --   HEPARINUNFRC  --   --   --   --  0.17* 0.26*  CREATININE 6.39*  --   --  8.58*  --   --    < > = values in this interval not displayed.    Assessment: 59yo female remains subtherapeutic on heparin though closer to goal; no gtt issues or signs of bleeding per RN.  Goal of Therapy:  Heparin level 0.3-0.5 units/ml   Plan:  Will increase heparin gtt by 1-2 units/kg/hr to 1150 units/hr and check level in 8 hours.    Wynona Neat, PharmD, BCPS  01/12/2020,5:14 AM

## 2020-01-13 LAB — RENAL FUNCTION PANEL
Albumin: 2.6 g/dL — ABNORMAL LOW (ref 3.5–5.0)
Anion gap: 16 — ABNORMAL HIGH (ref 5–15)
BUN: 46 mg/dL — ABNORMAL HIGH (ref 6–20)
CO2: 23 mmol/L (ref 22–32)
Calcium: 9.1 mg/dL (ref 8.9–10.3)
Chloride: 92 mmol/L — ABNORMAL LOW (ref 98–111)
Creatinine, Ser: 7.42 mg/dL — ABNORMAL HIGH (ref 0.44–1.00)
GFR calc Af Amer: 6 mL/min — ABNORMAL LOW (ref >60)
GFR calc non Af Amer: 6 mL/min — ABNORMAL LOW (ref >60)
Glucose, Bld: 90 mg/dL (ref 70–99)
Phosphorus: 4.1 mg/dL (ref 2.5–4.6)
Potassium: 3.8 mmol/L (ref 3.5–5.1)
Sodium: 131 mmol/L — ABNORMAL LOW (ref 135–145)

## 2020-01-13 LAB — HEPARIN LEVEL (UNFRACTIONATED)
Heparin Unfractionated: 0.32 IU/mL (ref 0.30–0.70)
Heparin Unfractionated: 0.42 IU/mL (ref 0.30–0.70)
Heparin Unfractionated: 0.39 IU/mL (ref 0.30–0.70)

## 2020-01-13 MED ORDER — CALCITRIOL 0.5 MCG PO CAPS
ORAL_CAPSULE | ORAL | Status: AC
  Filled 2020-01-13: qty 4

## 2020-01-13 MED ORDER — POLYETHYLENE GLYCOL 3350 17 G PO PACK
17.0000 g | PACK | Freq: Every day | ORAL | Status: DC
  Administered 2020-01-13: 17 g via ORAL
  Filled 2020-01-13: qty 1

## 2020-01-13 MED ORDER — WARFARIN - PHARMACIST DOSING INPATIENT
Freq: Every day | Status: DC
  Administered 2020-01-28 – 2020-01-29 (×2): 1

## 2020-01-13 MED ORDER — WARFARIN SODIUM 5 MG PO TABS
5.0000 mg | ORAL_TABLET | Freq: Once | ORAL | Status: AC
  Administered 2020-01-13: 5 mg via ORAL
  Filled 2020-01-13: qty 1

## 2020-01-13 NOTE — Progress Notes (Signed)
ANTICOAGULATION CONSULT NOTE - Follow Up Consult  Pharmacy Consult for Heparin Indication: mech heart valves  No Known Allergies  Patient Measurements: Height: 5\' 6"  (167.6 cm) Weight: 63.6 kg (140 lb 3.4 oz) (stood to scale ) IBW/kg (Calculated) : 59.3 Heparin Dosing Weight: 65.1 kg  Vital Signs: Temp: 99.1 F (37.3 C) (08/24 2226) Temp Source: Oral (08/24 2226) BP: 123/82 (08/24 2226) Pulse Rate: 101 (08/24 2226)  Labs: Recent Labs    01/10/20 0527 01/10/20 0527 01/11/20 0539 01/11/20 0540 01/11/20 1940 01/12/20 0436 01/12/20 0749 01/12/20 0749 01/12/20 1422 01/12/20 1757 01/13/20 0102  HGB 8.0*   < > 8.3*  --    < >  --  8.7*   < > 8.6* 8.8*  --   HCT 24.8*   < > 25.3*  --    < >  --  26.9*  --  26.9* 27.7*  --   PLT 134*   < > 160  --    < >  --  186  --  201 199  --   LABPROT 14.2  --  13.9  --   --   --   --   --   --   --   --   INR 1.2  --  1.1  --   --   --   --   --   --   --   --   HEPARINUNFRC  --   --   --   --    < > 0.26*  --   --  0.28*  --  0.32  CREATININE 6.39*  --   --  8.58*  --  5.27*  --   --   --   --   --    < > = values in this interval not displayed.    Estimated Creatinine Clearance: 11 mL/min (A) (by C-G formula based on SCr of 5.27 mg/dL (H)).   Assessment: 10 YOF on warfarin at home for mechanical aortic and mitral valves presented with a large retroperitoneal bleed in the setting of a supratherapeutic INR which required reversal with 37F-PCC and vit K. Patient remains stable off anticoag. IV heparin was briefly started on 8/21 but held shortly after for drop in Hgb. Discussed case with cardiologist who recommends resuming IV Heparin conservatively without bolus prior to start warfarin.   Heparin level therapeutic (0.32) on gtt at 1250 units/hr. No overt bleeding noted.  Goal of Therapy:  Heparin level 0.3-0.5 units/ml Monitor platelets by anticoagulation protocol: Yes   Plan:  -Continue IV heparin at 1250 units/hr  -F/u 6 hr  heparin level to confirm therapeutic -If patient tolerates heparin in goal range, will plan to restart warfarin per cardiology   Sherlon Handing, PharmD, BCPS Please see amion for complete clinical pharmacist phone list 01/13/2020 1:41 AM

## 2020-01-13 NOTE — Progress Notes (Signed)
Progress Note  Patient Name: [name] Date of Encounter: 01/13/2020  Primary Cardiologist: Dorris Carnes, MD  Subjective   Pt comfortable in dialysis  No CP   No SOB   Inpatient Medications    Scheduled Meds: . calcitRIOL  2 mcg Oral Q M,W,F  . Chlorhexidine Gluconate Cloth  6 each Topical Daily  . docusate sodium  100 mg Oral Daily  . metoprolol tartrate  25 mg Oral BID  . pantoprazole (PROTONIX) IV  40 mg Intravenous Daily  . senna  1 tablet Oral Daily  . simethicone  80 mg Oral TID  . sodium chloride flush  10-40 mL Intracatheter Q12H   Continuous Infusions: . heparin 1,250 Units/hr (01/13/20 0616)   PRN Meds: calcium carbonate, ondansetron, oxyCODONE-acetaminophen, sodium chloride flush   Vital Signs    Vitals:   01/12/20 1943 01/12/20 2038 01/12/20 2226 01/13/20 0441  BP: 121/75 (!) 147/90 123/82 130/83  Pulse: (!) 101 (!) 102 (!) 101 96  Resp: 18  18 20   Temp: 98.3 F (36.8 C)  99.1 F (37.3 C) 98.8 F (37.1 C)  TempSrc: Oral  Oral Oral  SpO2: 94%  92% 97%  Weight:    64.9 kg  Height:        Intake/Output Summary (Last 24 hours) at 01/13/2020 0746 Last data filed at 01/12/2020 2041 Gross per 24 hour  Intake 420.57 ml  Output --  Net 420.57 ml   Filed Weights   01/11/20 0650 01/11/20 1122 01/13/20 0441  Weight: 66.4 kg 63.6 kg 64.9 kg    Physical Exam   GEN: Thin 59 yo , in no acute distress.  HEENT: Grossly normal.  Neck: , no JVD, Cardiac: RRR,Crisp valve sounds .  Respiratory:  Respirations regular and unlabored, clear to auscultation bilaterally. GI: Soft, nontender, nondistended, BS + x 4. MS: no LE edema  .  Labs    Chemistry Recent Labs  Lab 01/06/20 0911 01/06/20 0911 01/07/20 0350 01/08/20 0500 01/11/20 0540 01/12/20 0436 01/13/20 0102  NA 136   < > 138   < > 130* 135 131*  K 3.9   < > 4.5   < > 3.9 3.7 3.8  CL 91*   < > 92*   < > 88* 92* 92*  CO2 27   < > 26   < > 25 27 23   GLUCOSE 118*   < > 95   < > 82 91  90  BUN 63*   < > 78*   < > 49* 28* 46*  CREATININE 9.54*   < > 10.76*   < > 8.58* 5.27* 7.42*  CALCIUM 9.3   < > 9.2   < > 9.1 9.5 9.1  PROT 8.4*  --  7.1  --   --   --   --   ALBUMIN 3.5   < > 3.3*   < > 2.6* 2.6* 2.6*  AST 28  --  30  --   --   --   --   ALT 19  --  17  --   --   --   --   ALKPHOS 110  --  80  --   --   --   --   BILITOT 0.5  --  0.2*  --   --   --   --   GFRNONAA 4*   < > 4*   < > 5* 8* 6*  GFRAA 5*   < >  4*   < > 5* 10* 6*  ANIONGAP 18*   < > 20*   < > 17* 16* 16*   < > = values in this interval not displayed.     Hematology Recent Labs  Lab 01/12/20 0749 01/12/20 1422 01/12/20 1757  WBC 9.3 9.7 10.1  RBC 2.77* 2.75* 2.88*  HGB 8.7* 8.6* 8.8*  HCT 26.9* 26.9* 27.7*  MCV 97.1 97.8 96.2  MCH 31.4 31.3 30.6  MCHC 32.3 32.0 31.8  RDW 15.8* 15.8* 15.6*  PLT 186 201 199    Cardiac EnzymesNo results for input(s): TROPONINI in the last 168 hours. No results for input(s): TROPIPOC in the last 168 hours.   BNPNo results for input(s): BNP, PROBNP in the last 168 hours.   DDimer No results for input(s): DDIMER in the last 168 hours.   Radiology    No results found.  Telemetry    SR - Personally Reviewed    Cardiac Studies   TTE 03/12/2019 1. Left ventricular ejection fraction, by visual estimation, is 65 to  70%. The left ventricle has normal function. Normal left ventricular size.  There is mildly increased left ventricular hypertrophy.  2. Global right ventricle has normal systolic function.The right  ventricular size is normal. No increase in right ventricular wall  thickness.  3. Left atrial size was mildly dilated.  4. Right atrial size was normal.  5. The mitral valve has been repaired/replaced. No evidence of mitral  valve regurgitation. No evidence of mitral stenosis.  6. [employer] Optiform bileaflet mechanical valve (size 31 mm, cat  # [id number], serial # [id number]).   Mean gradient 7mmHg.  7. The tricuspid valve is  normal in structure. Tricuspid valve  regurgitation is trivial.  8. Aortic valve regurgitation was not visualized by color flow Doppler.  Structurally normal aortic valve, with no evidence of sclerosis or  stenosis.  9. Mechanical prosthesis in the aortic valve position.  10. [employer] Top-Hat bileaflet mechanical valve (size 21 mm, cat  # [id number], serial # [id number]).   Peak velocity: 2.27m/s, mean gradient 96mmHg.  11. The pulmonic valve was normal in structure. Pulmonic valve  regurgitation is not visualized by color flow Doppler.  12. Normal pulmonary artery systolic pressure.  13. The inferior vena cava is normal in size with greater than 50%  respiratory variability, suggesting right atrial pressure of 3 mmHg  Patient Profile     60 y.o. female with ESRD, rheumatic AS/MS s/p mechanical AVR/MVR, HTN, lupus, admitted 01/06/2020 with spontaneous RP bleed.   Assessment & Plan    1.  Acute retroperitoneal bleed with acute blood loss anemia: -Hgb is holding steady on IV heparin    Urology has seen pt   Plan to resume coumadin and continue to monitor closely    2.  Mechanical MVR/AVR: -Anticoagulated with IV heparin with stable hemoglobin -Will begin coumadin  3.  ESRD: -HD MWF -Management per nephrology  4.  Hypertension: -Stable, 130/83, 123/82, 147/90 -Continue metoprolol 25 twice daily  5.  Tachycardia:   HR is improved   -Continue metoprolol 25 twice daily  Dorris Carnes MD     For questions or updates, please contact   Please consult [email or web address] for contact info under Cardiology/STEMI.

## 2020-01-13 NOTE — Progress Notes (Signed)
ANTICOAGULATION CONSULT NOTE - Follow Up Consult  Pharmacy Consult for Heparin + Coumadin Indication: artificial heart valves  No Known Allergies  Patient Measurements: Height: 5\' 6"  (167.6 cm) Weight: 65 kg (143 lb 4.8 oz) IBW/kg (Calculated) : 59.3 Heparin Dosing Weight:    Vital Signs: Temp: 98.3 F (36.8 C) (08/25 0830) Temp Source: Oral (08/25 0830) BP: 123/74 (08/25 1100) Pulse Rate: 98 (08/25 1100)  Labs: Recent Labs    01/11/20 0539 01/11/20 0540 01/11/20 1940 01/12/20 0436 01/12/20 0436 01/12/20 0749 01/12/20 1422 01/12/20 1757 01/13/20 0102 01/13/20 1122  HGB 8.3*  --    < >  --    < > 8.7* 8.6* 8.8*  --   --   HCT 25.3*  --    < >  --   --  26.9* 26.9* 27.7*  --   --   PLT 160  --    < >  --   --  186 201 199  --   --   LABPROT 13.9  --   --   --   --   --   --   --   --   --   INR 1.1  --   --   --   --   --   --   --   --   --   HEPARINUNFRC  --   --    < > 0.26*   < >  --  0.28*  --  0.32 0.42  CREATININE  --  8.58*  --  5.27*  --   --   --   --  7.42*  --    < > = values in this interval not displayed.    Estimated Creatinine Clearance: 7.8 mL/min (A) (by C-G formula based on SCr of 7.42 mg/dL (H)).   Assessment: Anticoag: s/p RP hematoma. Warfarin PTA for hx mech AVR/MVR (goal 2.5-3.5) reversed vit K 10mg  IV + 2 units FFP on 8/18.  INR down to 1.1. -Heparin resumed on 8/23, HL 0.42 in goal today. Resume warfarin 8/25.  - PTA Warfarin: 3 mg daily except 6 mg on Mondays- last 8/18  Goal of Therapy:  Heparin level 0.3-0.5 units/ml Monitor platelets by anticoagulation protocol: Yes   Plan:  - Con't IV heparin to 1250 units/hr. No bolus in the setting of retroperitoneal bleed. Low goal. - Coumadin 5mg  po x 1 tonight - Daily HL, CBC, INR   Angelito Hopping S. [facility] [place], PharmD, BCPS Clinical Staff Pharmacist [email or web address] Alford [place], [employer] 01/13/2020,11:59 AM

## 2020-01-13 NOTE — Progress Notes (Signed)
[facility] Progress Note    Assessment/ Plan:   1. L peri-renal bleed/ hematoma - by CT 8/18. S/p ffp, prbc, and vit k on admit, INR normalized - IV heparin restarted 8/21 then stopped 8/22 and given more prbc's (4 prbc total here), then IV hep restarted again yest 8/23, CBC ordered q 8hr and is stable in the 8.5- 9 range.  - seen by urology, if rebleeds next step would be embolization of the L kidney, ok to resume coumadin - continuing IV heparin and starting back on coumadin tonight   2. 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 ESRD - usual HD is MWF. HD today, pt is stable on HD.  4. Secondary hyperparathyroidism. Limit phos in diet, sensipar and calcitriol (phos 5.4 today).  On Tums which is reasonable in the setting of indigestion and hyperphosphatemia 5. HTN - BP's are good, on home metoprolol . Close to dry wt and no vol ^ on exam.   6. Nausea/vomiting - improved  OP HD: MWF HD  4h  61kg  450/800  2/2 bath  P2  Hep 1800  -sensipar 180mg  -calcitriol 52mcg -no esa, no iron  [name], MD 01/13/2020, 1:27 PM        Subjective:   ABd pain remains resolved, Hb up 8.7 this am.  No new c/o. s   Objective:   BP (!) 141/83 (BP Location: Left Arm)   Pulse (!) 111   Temp 98.5 F (36.9 C) (Oral)   Resp 18   Ht 5\' 6"  (1.676 m)   Wt 65 kg   LMP 12/05/2010 (LMP Unknown)   SpO2 97%   BMI 23.13 kg/m   Intake/Output Summary (Last 24 hours) at 01/13/2020 1327 Last data filed at 01/13/2020 1235 Gross per 24 hour  Intake 351.43 ml  Output 2500 ml  Net -2148.57 ml   Weight change: 1.3 kg  Physical Exam: Gen: No acute distress, comfortable CVS: tachycardic, +murmur, loud S2 Resp:unlabored, bl chest expansion, clear to auscultation bilaterally ABd: abd firm, mild tenderness L side , no rebound Ext:no edema Neuro: awake, alert, speech clear and coherent Access: rue avf with +b/t  Imaging: No results  found.  Labs: BMET Recent Labs  Lab 01/07/20 0350 01/08/20 0500 01/09/20 0414 01/10/20 0527 01/11/20 0540 01/12/20 0436 01/13/20 0102  NA 138 134* 135 132* 130* 135 131*  K 4.5 3.8 3.5 3.7 3.9 3.7 3.8  CL 92* 93* 93* 92* 88* 92* 92*  CO2 26 28 29 28 25 27 23   GLUCOSE 95 94 97 80 82 91 90  BUN 78* 30* 12 29* 49* 28* 46*  CREATININE 10.76* 5.90* 3.54* 6.39* 8.58* 5.27* 7.42*  CALCIUM 9.2 9.2 8.3* 8.8* 9.1 9.5 9.1  PHOS 9.5* 8.1* 4.4 5.4* 5.7* 3.9 4.1   CBC Recent Labs  Lab 01/08/20 1542 01/09/20 0414 01/11/20 1940 01/12/20 0749 01/12/20 1422 01/12/20 1757  WBC 18.1*   < > 9.6 9.3 9.7 10.1  NEUTROABS 16.3*  --   --   --   --   --   HGB 7.8*   < > 8.4* 8.7* 8.6* 8.8*  HCT 23.1*   < > 26.5* 26.9* 26.9* 27.7*  MCV 93.1   < > 96.7 97.1 97.8 96.2  PLT 123*   < > 172 186 201 199   < > = values in this interval not displayed.    Medications:    . calcitRIOL      .  calcitRIOL  2 mcg Oral Q M,W,F  . Chlorhexidine Gluconate Cloth  6 each Topical Daily  . docusate sodium  100 mg Oral Daily  . metoprolol tartrate  25 mg Oral BID  . pantoprazole (PROTONIX) IV  40 mg Intravenous Daily  . polyethylene glycol  17 g Oral Daily  . senna  1 tablet Oral Daily  . simethicone  80 mg Oral TID  . sodium chloride flush  10-40 mL Intracatheter Q12H  . warfarin  5 mg Oral ONCE-1600  . Warfarin - Pharmacist Dosing Inpatient   Does not apply [id number]

## 2020-01-13 NOTE — Progress Notes (Signed)
[facility] PROGRESS NOTE    Progress Note  [name]  GUY:[id number] DOB: 04/04/1961 DOA: 01/06/2020 PCP: Benito Mccreedy, MD     Brief Narrative:   [name] is an 59 y.o. female past medical history of end-stage renal disease on hemodialysis Monday Wednesday and Friday status post aortic and mitral valve replacement on Coumadin who presents with nausea and nonbloody emesis, she was found to have a spontaneous left retroperitoneal bleed she status post 1 unit of packed red blood cells 2 units of fresh frozen plasma and vitamin K,  Assessment/Plan:   Acute blood loss anemia secondary to spontaneous left retroperitoneal bleed while on Coumadin: Baseline hemoglobin around 8-9, moderate admission it was low she was transfused several units. Monitor hemoglobin closely,  transfusing as needed if hemoglobin less than 7 or symptomatic. She is currently on IV heparin. Urology was consulted and related that if the patient's hemoglobin remained stable he could go back on Coumadin.  Aortic and mitral valve stenosis status post AVR and MVR: Continue heparin per pharmacy appreciate assistance.  End-stage renal disease on Monday Wednesdays and Fridays: Continue HD per nephrology.  Prolonged QTC: Continue to monitor limit QT prolonging drugs.  Constipation: Continues Senokot docusate will add MiraLAX. PT OT eval pending.   DVT prophylaxis: heparin Family Communication:none Status is: Inpatient  Remains inpatient appropriate because:Hemodynamically unstable   Dispo: The patient is from: Home              Anticipated d/c is to: Home              Anticipated d/c date is: 3 days              Patient currently is not medically stable to d/c.        Code Status:     Code Status Orders  (From admission, onward)         Start     Ordered   01/06/20 1945  Full code  Continuous        01/06/20 1946        Code Status History    Date Active Date  Inactive Code Status Order ID Comments User Context   01/06/2019 1833 01/09/2019 2258 Full Code [id number]  Merton Border, MD Inpatient   12/02/2018 1402 12/11/2018 1548 Full Code [id number]  Rexene Alberts, MD Inpatient   07/24/2018 0951 07/24/2018 1652 Full Code [id number]  Larey Dresser, MD Inpatient   03/14/2018 2044 03/15/2018 2213 Full Code [id number]  Shela Leff, MD ED   10/21/2017 2044 10/25/2017 1848 Full Code [id number]  Vianne Bulls, MD ED   01/27/2017 0435 01/30/2017 1756 Full Code [id number]  Quintella Baton, MD ED   10/09/2016 1548 10/10/2016 2135 Full Code [id number]  Latanya [name], MD Inpatient   05/01/2015 2236 05/05/2015 1850 Full Code [id number]  Norval Morton, MD Inpatient   07/25/2014 2354 08/01/2014 1724 Full Code [id number]  Allie Bossier, MD ED   Advance Care Planning Activity        IV Access:    Peripheral IV   Procedures and diagnostic studies:   No results found.   Medical Consultants:    None.  Anti-Infectives:   none  Subjective:    [name] no new complaints feels great.  Objective:    Vitals:   01/13/20 0805 01/13/20 0830 01/13/20 0835 01/13/20 0900  BP: 115/70 (!) 151/83 (!) 119/59 135/88  Pulse: (!) 101 (!) 104 95 98  Resp: 18  13 17 19   Temp: 98.4 F (36.9 C) 98.3 F (36.8 C)    TempSrc: Oral Oral    SpO2: 100% 90%    Weight:  65 kg    Height:       SpO2: 90 % O2 Flow Rate (L/min): 2 L/min   Intake/Output Summary (Last 24 hours) at 01/13/2020 0924 Last data filed at 01/12/2020 2041 Gross per 24 hour  Intake 397.45 ml  Output --  Net 397.45 ml   Filed Weights   01/11/20 1122 01/13/20 0441 01/13/20 0830  Weight: 63.6 kg 64.9 kg 65 kg    Exam: General exam: In no acute distress. Respiratory system: Good air movement and clear to auscultation. Cardiovascular system: S1 & S2 heard, RRR. No JVD.  Gastrointestinal system: Abdomen is nondistended, soft and nontender.  Extremities: No pedal edema. Skin: No rashes,  lesions or ulcers   Data Reviewed:    Labs: Basic Metabolic Panel: Recent Labs  Lab 01/07/20 0350 01/08/20 0500 01/09/20 0414 01/09/20 0414 01/10/20 0527 01/10/20 0527 01/11/20 0540 01/11/20 0540 01/12/20 0436 01/13/20 0102  NA 138   < > 135  --  132*  --  130*  --  135 131*  K 4.5   < > 3.5   < > 3.7   < > 3.9   < > 3.7 3.8  CL 92*   < > 93*  --  92*  --  88*  --  92* 92*  CO2 26   < > 29  --  28  --  25  --  27 23  GLUCOSE 95   < > 97  --  80  --  82  --  91 90  BUN 78*   < > 12  --  29*  --  49*  --  28* 46*  CREATININE 10.76*   < > 3.54*  --  6.39*  --  8.58*  --  5.27* 7.42*  CALCIUM 9.2   < > 8.3*  --  8.8*  --  9.1  --  9.5 9.1  MG 2.3  --   --   --   --   --   --   --   --   --   PHOS 9.5*   < > 4.4  --  5.4*  --  5.7*  --  3.9 4.1   < > = values in this interval not displayed.   GFR Estimated Creatinine Clearance: 7.8 mL/min (A) (by C-G formula based on SCr of 7.42 mg/dL (H)). Liver Function Tests: Recent Labs  Lab 01/07/20 0350 01/08/20 0500 01/09/20 0414 01/10/20 0527 01/11/20 0540 01/12/20 0436 01/13/20 0102  AST 30  --   --   --   --   --   --   ALT 17  --   --   --   --   --   --   ALKPHOS 80  --   --   --   --   --   --   BILITOT 0.2*  --   --   --   --   --   --   PROT 7.1  --   --   --   --   --   --   ALBUMIN 3.3*   < > 2.9* 2.6* 2.6* 2.6* 2.6*   < > = values in this interval not displayed.   No results for input(s): LIPASE, AMYLASE in the last 168 hours.  No results for input(s): AMMONIA in the last 168 hours. Coagulation profile Recent Labs  Lab 01/07/20 0350 01/08/20 0500 01/09/20 0414 01/10/20 0527 01/11/20 0539  INR 1.4* 1.2 1.2 1.2 1.1   COVID-19 Labs  No results for input(s): DDIMER, FERRITIN, LDH, CRP in the last 72 hours.  Lab Results  Component Value Date   SARSCOV2NAA NEGATIVE 01/06/2020   [place] NEGATIVE 01/06/2019   [place] NEGATIVE 11/28/2018    CBC: Recent Labs  Lab 01/08/20 1542 01/09/20 0414  01/11/20 0539 01/11/20 1940 01/12/20 0749 01/12/20 1422 01/12/20 1757  WBC 18.1*   < > 11.3* 9.6 9.3 9.7 10.1  NEUTROABS 16.3*  --   --   --   --   --   --   HGB 7.8*   < > 8.3* 8.4* 8.7* 8.6* 8.8*  HCT 23.1*   < > 25.3* 26.5* 26.9* 26.9* 27.7*  MCV 93.1   < > 96.2 96.7 97.1 97.8 96.2  PLT 123*   < > 160 172 186 201 199   < > = values in this interval not displayed.   Cardiac Enzymes: No results for input(s): CKTOTAL, CKMB, CKMBINDEX, TROPONINI in the last 168 hours. BNP (last 3 results) No results for input(s): PROBNP in the last 8760 hours. CBG: Recent Labs  Lab 01/06/20 1857  GLUCAP 174*   D-Dimer: No results for input(s): DDIMER in the last 72 hours. Hgb A1c: No results for input(s): HGBA1C in the last 72 hours. Lipid Profile: No results for input(s): CHOL, HDL, LDLCALC, TRIG, CHOLHDL, LDLDIRECT in the last 72 hours. Thyroid function studies: Recent Labs    01/11/20 1255  TSH 1.615   Anemia work up: No results for input(s): VITAMINB12, FOLATE, FERRITIN, TIBC, IRON, RETICCTPCT in the last 72 hours. Sepsis Labs: Recent Labs  Lab 01/11/20 1940 01/12/20 0749 01/12/20 1422 01/12/20 1757  WBC 9.6 9.3 9.7 10.1   Microbiology Recent Results (from the past 240 hour(s))  SARS Coronavirus 2 by RT PCR (hospital order, performed in [facility] hospital lab) Nasopharyngeal Nasopharyngeal Swab     Status: None   Collection Time: 01/06/20  5:20 PM   Specimen: Nasopharyngeal Swab  Result Value Ref Range Status   SARS Coronavirus 2 NEGATIVE NEGATIVE Final    Comment: (NOTE) SARS-CoV-2 target nucleic acids are NOT DETECTED.  The SARS-CoV-2 RNA is generally detectable in upper and lower respiratory specimens during the acute phase of infection. The lowest concentration of SARS-CoV-2 viral copies this assay can detect is 250 copies / mL. A negative result does not preclude SARS-CoV-2 infection and should not be used as the sole basis for treatment or other patient  management decisions.  A negative result may occur with improper specimen collection / handling, submission of specimen other than nasopharyngeal swab, presence of viral mutation(s) within the areas targeted by this assay, and inadequate number of viral copies (<250 copies / mL). A negative result must be combined with clinical observations, patient history, and epidemiological information.  Fact Sheet for Patients:   StrictlyIdeas.no  Fact Sheet for Healthcare Providers: BankingDealers.co.za  This test is not yet approved or  cleared by the [place] FDA and has been authorized for detection and/or diagnosis of SARS-CoV-2 by FDA under an Emergency Use Authorization (EUA).  This EUA will remain in effect (meaning this test can be used) for the duration of the COVID-19 declaration under Section 564(b)(1) of the Act, 21 U.S.C. section 360bbb-3(b)(1), unless the authorization is terminated or revoked sooner.  Performed at  [facility], [place] [address] [place], [place] [address]   MRSA PCR Screening     Status: None   Collection Time: 01/07/20  3:53 AM   Specimen: Nasal Mucosa; Nasopharyngeal  Result Value Ref Range Status   MRSA by PCR NEGATIVE NEGATIVE Final    Comment:        The GeneXpert MRSA Assay (FDA approved for NASAL specimens only), is one component of a comprehensive MRSA colonization surveillance program. It is not intended to diagnose MRSA infection nor to guide or monitor treatment for MRSA infections. Performed at [facility], [place] [address], [place], [place] [address]      Medications:   . calcitRIOL  2 mcg Oral Q M,W,F  . Chlorhexidine Gluconate Cloth  6 each Topical Daily  . docusate sodium  100 mg Oral Daily  . metoprolol tartrate  25 mg Oral BID  . pantoprazole (PROTONIX) IV  40 mg Intravenous Daily  . senna  1 tablet Oral Daily  . simethicone  80 mg Oral TID  . sodium chloride flush   10-40 mL Intracatheter Q12H   Continuous Infusions: . heparin 1,250 Units/hr (01/13/20 0616)      LOS: 7 days   Charlynne Cousins  [facility]  01/13/2020, 9:24 AM

## 2020-01-13 NOTE — Progress Notes (Signed)
   01/13/20 1315  Assess: MEWS Score  Temp 98.5 F (36.9 C)  BP (!) 141/83  Pulse Rate (!) 111  Resp 18  SpO2 97 %  O2 Device Room Air  Patient Activity (if Appropriate) In bed  Assess: MEWS Score  MEWS Temp 0  MEWS Systolic 0  MEWS Pulse 2  MEWS RR 0  MEWS LOC 0  MEWS Score 2  MEWS Score Color Yellow   - MEWS score fired yellow due to elevated HR which is not an acute change -This RN will give scheduled beta blocker then will re-assess

## 2020-01-14 DIAGNOSIS — K922 Gastrointestinal hemorrhage, unspecified: Secondary | ICD-10-CM

## 2020-01-14 LAB — RENAL FUNCTION PANEL
Albumin: 2.6 g/dL — ABNORMAL LOW (ref 3.5–5.0)
Anion gap: 14 (ref 5–15)
BUN: 30 mg/dL — ABNORMAL HIGH (ref 6–20)
CO2: 25 mmol/L (ref 22–32)
Calcium: 9.6 mg/dL (ref 8.9–10.3)
Chloride: 93 mmol/L — ABNORMAL LOW (ref 98–111)
Creatinine, Ser: 4.98 mg/dL — ABNORMAL HIGH (ref 0.44–1.00)
GFR calc Af Amer: 10 mL/min — ABNORMAL LOW (ref >60)
GFR calc non Af Amer: 9 mL/min — ABNORMAL LOW (ref >60)
Glucose, Bld: 92 mg/dL (ref 70–99)
Phosphorus: 3.6 mg/dL (ref 2.5–4.6)
Potassium: 4.2 mmol/L (ref 3.5–5.1)
Sodium: 132 mmol/L — ABNORMAL LOW (ref 135–145)

## 2020-01-14 LAB — CBC
HCT: 26.8 % — ABNORMAL LOW (ref 36.0–46.0)
HCT: 27.8 % — ABNORMAL LOW (ref 36.0–46.0)
Hemoglobin: 8.5 g/dL — ABNORMAL LOW (ref 12.0–15.0)
Hemoglobin: 9 g/dL — ABNORMAL LOW (ref 12.0–15.0)
MCH: 30.6 pg (ref 26.0–34.0)
MCH: 31.3 pg (ref 26.0–34.0)
MCHC: 31.7 g/dL (ref 30.0–36.0)
MCHC: 32.4 g/dL (ref 30.0–36.0)
MCV: 96.4 fL (ref 80.0–100.0)
MCV: 96.5 fL (ref 80.0–100.0)
Platelets: 224 10*3/uL (ref 150–400)
Platelets: 257 10*3/uL (ref 150–400)
RBC: 2.78 MIL/uL — ABNORMAL LOW (ref 3.87–5.11)
RBC: 2.88 MIL/uL — ABNORMAL LOW (ref 3.87–5.11)
RDW: 15.5 % (ref 11.5–15.5)
RDW: 15.6 % — ABNORMAL HIGH (ref 11.5–15.5)
WBC: 8.1 10*3/uL (ref 4.0–10.5)
WBC: 8.5 10*3/uL (ref 4.0–10.5)
nRBC: 0 % (ref 0.0–0.2)
nRBC: 0 % (ref 0.0–0.2)

## 2020-01-14 LAB — PROTIME-INR
INR: 1.1 (ref 0.8–1.2)
Prothrombin Time: 13.8 seconds (ref 11.4–15.2)

## 2020-01-14 LAB — HEPARIN LEVEL (UNFRACTIONATED): Heparin Unfractionated: 0.46 IU/mL (ref 0.30–0.70)

## 2020-01-14 MED ORDER — POLYETHYLENE GLYCOL 3350 17 G PO PACK
17.0000 g | PACK | Freq: Two times a day (BID) | ORAL | Status: DC
  Administered 2020-01-14 – 2020-02-11 (×14): 17 g via ORAL
  Filled 2020-01-14 (×45): qty 1

## 2020-01-14 MED ORDER — WARFARIN SODIUM 5 MG PO TABS
5.0000 mg | ORAL_TABLET | Freq: Once | ORAL | Status: AC
  Administered 2020-01-14: 5 mg via ORAL
  Filled 2020-01-14: qty 1

## 2020-01-14 NOTE — Progress Notes (Signed)
Pt had bloody stool this morning, MD aware,phamacist aware. Had another BM this afternoon formed and brown, no blood noted.

## 2020-01-14 NOTE — Progress Notes (Signed)
ANTICOAGULATION CONSULT NOTE - Follow Up Consult  Pharmacy Consult for Heparin + Coumadin Indication: artificial heart valves  No Known Allergies  Patient Measurements: Height: 5\' 6"  (167.6 cm) Weight: 63 kg (138 lb 14.2 oz) IBW/kg (Calculated) : 59.3 Heparin Dosing Weight:    Vital Signs: Temp: 99 F (37.2 C) (08/26 0354) Temp Source: Oral (08/26 0354) BP: 137/75 (08/26 0354) Pulse Rate: 90 (08/26 0354)  Labs: Recent Labs    01/12/20 0436 01/12/20 0749 01/12/20 1422 01/12/20 1422 01/12/20 1757 01/13/20 0102 01/13/20 0102 01/13/20 1122 01/13/20 1316 01/14/20 0715 01/14/20 0834  HGB  --    < > 8.6*   < > 8.8*  --   --   --   --  8.5*  --   HCT  --    < > 26.9*  --  27.7*  --   --   --   --  26.8*  --   PLT  --    < > 201  --  199  --   --   --   --  224  --   LABPROT  --   --   --   --   --   --   --   --   --   --  13.8  INR  --   --   --   --   --   --   --   --   --   --  1.1  HEPARINUNFRC 0.26*   < > 0.28*   < >  --  0.32   < > 0.42 0.39 0.46  --   CREATININE 5.27*  --   --   --   --  7.42*  --   --   --  4.98*  --    < > = values in this interval not displayed.    Estimated Creatinine Clearance: 11.7 mL/min (A) (by C-G formula based on SCr of 4.98 mg/dL (H)).   Assessment: Anticoag: s/p RP hematoma. Warfarin PTA for hx mech AVR/MVR (goal 2.5-3.5) reversed vit K 10mg  IV + 2 units FFP on 8/18.  -Heparin resumed on 8/23 and resumed warfarin 8/25. -heparin level= 8.5, hg= 8.5 (stable)  Noted with BRBPR. Spoke with Dr. Olevia Bowens: continue heparin and warfarin. Plans to check CBC at 5pm  - PTA Warfarin: 3 mg daily except 6 mg on Mondays- last 8/18  Goal of Therapy:  Heparin level 0.3-0.5 units/ml Monitor platelets by anticoagulation protocol: Yes   Plan:  - Decrease heparin to 1150 units/hr - Coumadin 5mg  po x 1 tonight (will give after CBC checked) - Daily HL, CBC, INR  Hildred Laser, PharmD Clinical Pharmacist **Pharmacist phone directory can now be found  on [email or web address] ([email or web address]).  Listed under [place].

## 2020-01-14 NOTE — Progress Notes (Addendum)
Progress Note  Patient Name: [name] Date of Encounter: 01/14/2020  Primary Cardiologist: Dorris Carnes, MD  Subjective   Feeling well today, no specific complaints   Inpatient Medications    Scheduled Meds: . calcitRIOL  2 mcg Oral Q M,W,F  . Chlorhexidine Gluconate Cloth  6 each Topical Daily  . docusate sodium  100 mg Oral Daily  . metoprolol tartrate  25 mg Oral BID  . pantoprazole (PROTONIX) IV  40 mg Intravenous Daily  . polyethylene glycol  17 g Oral Daily  . senna  1 tablet Oral Daily  . simethicone  80 mg Oral TID  . sodium chloride flush  10-40 mL Intracatheter Q12H  . Warfarin - Pharmacist Dosing Inpatient   Does not apply q1600   Continuous Infusions: . heparin 1,250 Units/hr (01/14/20 0227)   PRN Meds: calcium carbonate, ondansetron, oxyCODONE-acetaminophen, sodium chloride flush   Vital Signs    Vitals:   01/13/20 2035 01/14/20 0028 01/14/20 0039 01/14/20 0354  BP: 118/74 121/77 (!) 100/57 137/75  Pulse: 96 85 72 90  Resp: 17 16 18 17   Temp: 98.7 F (37.1 C) 98.6 F (37 C) 98.3 F (36.8 C) 99 F (37.2 C)  TempSrc: Oral Oral Oral Oral  SpO2: 100% 100% 94% 98%  Weight:    63 kg  Height:        Intake/Output Summary (Last 24 hours) at 01/14/2020 0738 Last data filed at 01/14/2020 5621 Gross per 24 hour  Intake 1107.22 ml  Output 2500 ml  Net -1392.78 ml   Filed Weights   01/13/20 0441 01/13/20 0830 01/14/20 0354  Weight: 64.9 kg 65 kg 63 kg    Physical Exam   General: Well developed, well nourished, NAD Neck: Negative for carotid bruits. No JVD Lungs:Clear to ausculation bilaterally. Breathing is unlabored. Cardiovascular: RRR with S1 S2. No murmurs Abdomen: Soft, non-tender, non-distended. No obvious abdominal masses. Extremities: No edema. Radial pulses 2+ bilaterally Neuro: Alert and oriented. No focal deficits. No facial asymmetry. MAE spontaneously. Psych: Responds to questions appropriately with normal affect.     Labs    Chemistry Recent Labs  Lab 01/11/20 0540 01/12/20 0436 01/13/20 0102  NA 130* 135 131*  K 3.9 3.7 3.8  CL 88* 92* 92*  CO2 25 27 23   GLUCOSE 82 91 90  BUN 49* 28* 46*  CREATININE 8.58* 5.27* 7.42*  CALCIUM 9.1 9.5 9.1  ALBUMIN 2.6* 2.6* 2.6*  GFRNONAA 5* 8* 6*  GFRAA 5* 10* 6*  ANIONGAP 17* 16* 16*     Hematology Recent Labs  Lab 01/12/20 1422 01/12/20 1757 01/14/20 0715  WBC 9.7 10.1 8.1  RBC 2.75* 2.88* 2.78*  HGB 8.6* 8.8* 8.5*  HCT 26.9* 27.7* 26.8*  MCV 97.8 96.2 96.4  MCH 31.3 30.6 30.6  MCHC 32.0 31.8 31.7  RDW 15.8* 15.6* 15.5  PLT 201 199 224    Cardiac EnzymesNo results for input(s): TROPONINI in the last 168 hours. No results for input(s): TROPIPOC in the last 168 hours.   BNPNo results for input(s): BNP, PROBNP in the last 168 hours.   DDimer No results for input(s): DDIMER in the last 168 hours.   Radiology    No results found.  Telemetry    01/14/20 NSR- Personally Reviewed  ECG    No new tracing as of 01/14/20- Personally Reviewed  Cardiac Studies   TTE 03/12/2019 1. Left ventricular ejection fraction, by visual estimation, is 65 to  70%. The left ventricle  has normal function. Normal left ventricular size.  There is mildly increased left ventricular hypertrophy.  2. Global right ventricle has normal systolic function.The right  ventricular size is normal. No increase in right ventricular wall  thickness.  3. Left atrial size was mildly dilated.  4. Right atrial size was normal.  5. The mitral valve has been repaired/replaced. No evidence of mitral  valve regurgitation. No evidence of mitral stenosis.  6. [employer] Optiform bileaflet mechanical valve (size 31 mm, cat  # [id number], serial # [id number]).   Mean gradient 32mmHg.  7. The tricuspid valve is normal in structure. Tricuspid valve  regurgitation is trivial.  8. Aortic valve regurgitation was not visualized by color flow Doppler.  Structurally  normal aortic valve, with no evidence of sclerosis or  stenosis.  9. Mechanical prosthesis in the aortic valve position.  10. [employer] Top-Hat bileaflet mechanical valve (size 21 mm, cat  # [id number], serial # [id number]).   Peak velocity: 2.38m/s, mean gradient 52mmHg.  11. The pulmonic valve was normal in structure. Pulmonic valve  regurgitation is not visualized by color flow Doppler.  12. Normal pulmonary artery systolic pressure.  13. The inferior vena cava is normal in size with greater than 50%  respiratory variability, suggesting right atrial pressure of 3 mmHg  Patient Profile     59 y.o. female with ESRD, rheumatic AS/MS s/p mechanical AVR/MVR, HTN, lupus, admitted 01/06/2020 with spontaneous RP bleed.   Assessment & Plan    1.  Acute retroperitoneal bleed with acute blood loss anemia: -Hgb is holding steady >> to be transitioned over to coumadin today  -Hb today at 8.5>>>down from 8.8  2.  Mechanical MVR/AVR: -Pt is on heparin and coumadin loading   Had BRBPR earlier   Hold  Check CBC       3.  ESRD: -HD MWF -Management per nephrology  4.  Hypertension: -BP is OK Stable, 137/75>100/57>121/77 -Continue metoprolol 25 twice daily  5.  Tachycardia: -Improving>>90>72>85>96 -Continue metoprolol 25 twice daily   Signed, Kathyrn Drown NP-C [facility] Pager: [phone] 01/14/2020, 7:38 AM    Patient seen and examined   I agree with findings as noted above by Tonny Branch  But I have amended some due to changes in clinical status Pt had BRBPR earlier this am   Seen by nurse   Never had before   Last BM about 7 days ago    She says she wasn't strainng   Stop anticoagulation  WIll need to check CBC  Consider GI eval (at least with exam)  Since she requires long term coumadin ? Hemorrhoid       Will follow   For questions or updates, please contact   Please consult [email or web address] for contact info under Cardiology/STEMI.

## 2020-01-14 NOTE — Progress Notes (Signed)
[facility] Progress Note  Subjective: Notes BRBPR this am, was not straining, but does have hx of straining and  Constipation for "quite some time".  No hx rectal bleeding. Hep put on hold per cardiology   Vitals:   01/13/20 2035 01/14/20 0028 01/14/20 0039 01/14/20 0354  BP: 118/74 121/77 (!) 100/57 137/75  Pulse: 96 85 72 90  Resp: 17 16 18 17   Temp: 98.7 F (37.1 C) 98.6 F (37 C) 98.3 F (36.8 C) 99 F (37.2 C)  TempSrc: Oral Oral Oral Oral  SpO2: 100% 100% 94% 98%  Weight:    63 kg  Height:        Exam: Gen: No acute distress, comfortable CVS: tachycardic, +murmur, loud S2 Resp:unlabored, bl chest expansion, clear to auscultation bilaterally ABd: abd firm, mild tenderness L side , no rebound Ext:no edema Neuro: awake, alert, speech clear and coherent Access: rue avf with +b/t  OP HD: MWF HD  4h  61kg  450/800  2/2 bath  P2  Hep 1800  -sensipar 180mg  -calcitriol 13mcg -no esa, no iron    Assessment/ Plan:   1. L peri-renal bleed/ hematoma - by CT 8/18. S/p ffp, prbc, and vit k on admit, INR normalized. IV heparin restarted 8/21 then stopped 8/22 and given more prbc's (4 prbc total here), IV hep restarted again 8/23. Following serial Hb. Pt seen by urology, if rebleeds next step would be embolization of the L kidney, ok to resume coumadin which was restarted 8/25.  - Hb stable today, but new rectal bleeding so IV hep on hold again 2. Rectal bleeding - overnight, new for her, no hx of same. Hb stable as above. Per primary 3. 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 ESRD - usual HD is MWF. HD tomorrow. 5. Secondary hyperparathyroidism. Limit phos in diet, sensipar and calcitriol (phos 5.4 today).  On Tums which is reasonable in the setting of indigestion and hyperphosphatemia 6. HTN - BP's are good, on home metoprolol . Close to dry wt and no vol ^ on exam.    7. Nausea/vomiting - improved    [name], MD 01/14/2020,  11:41 AM        Subjective:      Objective:   BP 137/75 (BP Location: Left Arm)   Pulse 90   Temp 99 F (37.2 C) (Oral)   Resp 17   Ht 5\' 6"  (1.676 m)   Wt 63 kg   LMP 12/05/2010 (LMP Unknown)   SpO2 98%   BMI 22.42 kg/m   Intake/Output Summary (Last 24 hours) at 01/14/2020 1141 Last data filed at 01/14/2020 0939 Gross per 24 hour  Intake 1327.22 ml  Output 2501 ml  Net -1173.78 ml   Weight change: 0.1 kg  Physical Exam:     [name] 01/14/2020, 11:46 AM   Recent Labs  Lab 01/12/20 0436 01/12/20 1757 01/13/20 0102 01/14/20 0715  K   < >  --  3.8 4.2  BUN   < >  --  46* 30*  CREATININE   < >  --  7.42* 4.98*  CALCIUM   < >  --  9.1 9.6  PHOS   < >  --  4.1 3.6  HGB  --  8.8*  --  8.5*   < > = values in this interval not displayed.   Inpatient medications: . calcitRIOL  2 mcg Oral Q M,W,F  . Chlorhexidine Gluconate Cloth  6  each Topical Daily  . docusate sodium  100 mg Oral Daily  . metoprolol tartrate  25 mg Oral BID  . pantoprazole (PROTONIX) IV  40 mg Intravenous Daily  . polyethylene glycol  17 g Oral BID  . senna  1 tablet Oral Daily  . simethicone  80 mg Oral TID  . sodium chloride flush  10-40 mL Intracatheter Q12H  . warfarin  5 mg Oral Once  . Warfarin - Pharmacist Dosing Inpatient   Does not apply q1600   . heparin 1,150 Units/hr (01/14/20 0858)   calcium carbonate, ondansetron, oxyCODONE-acetaminophen, sodium chloride flush

## 2020-01-14 NOTE — Progress Notes (Signed)
[facility] PROGRESS NOTE    Progress Note  [name]  ACZ:[id number] DOB: 05/26/1960 DOA: 01/06/2020 PCP: Benito Mccreedy, MD     Brief Narrative:   [name] is an 59 y.o. female past medical history of end-stage renal disease on hemodialysis Monday Wednesday and Friday status post aortic and mitral valve replacement on Coumadin who presents with nausea and nonbloody emesis, she was found to have a spontaneous left retroperitoneal bleed she status post 1 unit of packed red blood cells 2 units of fresh frozen plasma and vitamin K,  Assessment/Plan:   Acute blood loss anemia secondary to spontaneous left retroperitoneal bleed while on Coumadin: Baseline hemoglobin around 8-9, low on admission she was transfused several units. Monitor hemoglobin closely,  transfusing as needed if hemoglobin less than 7 or symptomatic. She is currently on IV heparin, starting oral Coumadin today goal INR 2-3. She had a bloody bowel movement this morning, her hemoglobin has remained stable, will recheck a CBC this afternoon.  Aortic and mitral valve stenosis status post AVR and MVR: Continue heparin per pharmacy appreciate assistance. Start Coumadin per pharmacy.  End-stage renal disease on Monday Wednesdays and Fridays: Continue HD per nephrology.  Prolonged QTC: Continue to monitor limit QT prolonging drugs.  Constipation: Continue Senokot and MiraLAX, she relates her last bowel movement was about 7 days ago.   DVT prophylaxis: heparin Family Communication:none Status is: Inpatient  Remains inpatient appropriate because:Hemodynamically unstable   Dispo: The patient is from: Home              Anticipated d/c is to: Home              Anticipated d/c date is: 3 days              Patient currently is not medically stable to d/c.        Code Status:     Code Status Orders  (From admission, onward)         Start     Ordered   01/06/20 1945  Full code   Continuous        01/06/20 1946        Code Status History    Date Active Date Inactive Code Status Order ID Comments User Context   01/06/2019 1833 01/09/2019 2258 Full Code [id number]  Merton Border, MD Inpatient   12/02/2018 1402 12/11/2018 1548 Full Code [id number]  Rexene Alberts, MD Inpatient   07/24/2018 0951 07/24/2018 1652 Full Code [id number]  Larey Dresser, MD Inpatient   03/14/2018 2044 03/15/2018 2213 Full Code [id number]  Shela Leff, MD ED   10/21/2017 2044 10/25/2017 1848 Full Code [id number]  Vianne Bulls, MD ED   01/27/2017 0435 01/30/2017 1756 Full Code [id number]  Quintella Baton, MD ED   10/09/2016 1548 10/10/2016 2135 Full Code [id number]  Latanya [name], MD Inpatient   05/01/2015 2236 05/05/2015 1850 Full Code [id number]  Norval Morton, MD Inpatient   07/25/2014 2354 08/01/2014 1724 Full Code [id number]  Allie Bossier, MD ED   Advance Care Planning Activity        IV Access:    Peripheral IV   Procedures and diagnostic studies:   No results found.   Medical Consultants:    None.  Anti-Infectives:   none  Subjective:    [name] had some cramping this morning resolved with bowel movements..  Objective:    Vitals:   01/13/20 2035 01/14/20 0028 01/14/20 0258 01/14/20 5277  BP: 118/74 121/77 (!) 100/57 137/75  Pulse: 96 85 72 90  Resp: 17 16 18 17   Temp: 98.7 F (37.1 C) 98.6 F (37 C) 98.3 F (36.8 C) 99 F (37.2 C)  TempSrc: Oral Oral Oral Oral  SpO2: 100% 100% 94% 98%  Weight:    63 kg  Height:       SpO2: 98 % O2 Flow Rate (L/min): 2 L/min   Intake/Output Summary (Last 24 hours) at 01/14/2020 0835 Last data filed at 01/14/2020 6073 Gross per 24 hour  Intake 1107.22 ml  Output 2500 ml  Net -1392.78 ml   Filed Weights   01/13/20 0441 01/13/20 0830 01/14/20 0354  Weight: 64.9 kg 65 kg 63 kg    Exam: General exam: In no acute distress. Respiratory system: Good air movement and clear to auscultation. Cardiovascular  system: S1 & S2 heard, RRR. No JVD. Gastrointestinal system: Abdomen is nondistended, soft and nontender.  Extremities: No pedal edema. Skin: No rashes, lesions or ulcers Psychiatry: Judgement and insight appear normal. Mood & affect appropriate.  Data Reviewed:    Labs: Basic Metabolic Panel: Recent Labs  Lab 01/10/20 0527 01/10/20 0527 01/11/20 0540 01/11/20 0540 01/12/20 0436 01/12/20 0436 01/13/20 0102 01/14/20 0715  NA 132*  --  130*  --  135  --  131* 132*  K 3.7   < > 3.9   < > 3.7   < > 3.8 4.2  CL 92*  --  88*  --  92*  --  92* 93*  CO2 28  --  25  --  27  --  23 25  GLUCOSE 80  --  82  --  91  --  90 92  BUN 29*  --  49*  --  28*  --  46* 30*  CREATININE 6.39*  --  8.58*  --  5.27*  --  7.42* 4.98*  CALCIUM 8.8*  --  9.1  --  9.5  --  9.1 9.6  PHOS 5.4*  --  5.7*  --  3.9  --  4.1 3.6   < > = values in this interval not displayed.   GFR Estimated Creatinine Clearance: 11.7 mL/min (A) (by C-G formula based on SCr of 4.98 mg/dL (H)). Liver Function Tests: Recent Labs  Lab 01/10/20 0527 01/11/20 0540 01/12/20 0436 01/13/20 0102 01/14/20 0715  ALBUMIN 2.6* 2.6* 2.6* 2.6* 2.6*   No results for input(s): LIPASE, AMYLASE in the last 168 hours. No results for input(s): AMMONIA in the last 168 hours. Coagulation profile Recent Labs  Lab 01/08/20 0500 01/09/20 0414 01/10/20 0527 01/11/20 0539  INR 1.2 1.2 1.2 1.1   COVID-19 Labs  No results for input(s): DDIMER, FERRITIN, LDH, CRP in the last 72 hours.  Lab Results  Component Value Date   SARSCOV2NAA NEGATIVE 01/06/2020   [place] NEGATIVE 01/06/2019   [place] NEGATIVE 11/28/2018    CBC: Recent Labs  Lab 01/08/20 1542 01/09/20 0414 01/11/20 1940 01/12/20 0749 01/12/20 1422 01/12/20 1757 01/14/20 0715  WBC 18.1*   < > 9.6 9.3 9.7 10.1 8.1  NEUTROABS 16.3*  --   --   --   --   --   --   HGB 7.8*   < > 8.4* 8.7* 8.6* 8.8* 8.5*  HCT 23.1*   < > 26.5* 26.9* 26.9* 27.7* 26.8*  MCV 93.1    < > 96.7 97.1 97.8 96.2 96.4  PLT 123*   < > 172 186 201 199 224   < > =  values in this interval not displayed.   Cardiac Enzymes: No results for input(s): CKTOTAL, CKMB, CKMBINDEX, TROPONINI in the last 168 hours. BNP (last 3 results) No results for input(s): PROBNP in the last 8760 hours. CBG: No results for input(s): GLUCAP in the last 168 hours. D-Dimer: No results for input(s): DDIMER in the last 72 hours. Hgb A1c: No results for input(s): HGBA1C in the last 72 hours. Lipid Profile: No results for input(s): CHOL, HDL, LDLCALC, TRIG, CHOLHDL, LDLDIRECT in the last 72 hours. Thyroid function studies: Recent Labs    01/11/20 1255  TSH 1.615   Anemia work up: No results for input(s): VITAMINB12, FOLATE, FERRITIN, TIBC, IRON, RETICCTPCT in the last 72 hours. Sepsis Labs: Recent Labs  Lab 01/12/20 0749 01/12/20 1422 01/12/20 1757 01/14/20 0715  WBC 9.3 9.7 10.1 8.1   Microbiology Recent Results (from the past 240 hour(s))  SARS Coronavirus 2 by RT PCR (hospital order, performed in [facility] hospital lab) Nasopharyngeal Nasopharyngeal Swab     Status: None   Collection Time: 01/06/20  5:20 PM   Specimen: Nasopharyngeal Swab  Result Value Ref Range Status   SARS Coronavirus 2 NEGATIVE NEGATIVE Final    Comment: (NOTE) SARS-CoV-2 target nucleic acids are NOT DETECTED.  The SARS-CoV-2 RNA is generally detectable in upper and lower respiratory specimens during the acute phase of infection. The lowest concentration of SARS-CoV-2 viral copies this assay can detect is 250 copies / mL. A negative result does not preclude SARS-CoV-2 infection and should not be used as the sole basis for treatment or other patient management decisions.  A negative result may occur with improper specimen collection / handling, submission of specimen other than nasopharyngeal swab, presence of viral mutation(s) within the areas targeted by this assay, and inadequate number of viral  copies (<250 copies / mL). A negative result must be combined with clinical observations, patient history, and epidemiological information.  Fact Sheet for Patients:   StrictlyIdeas.no  Fact Sheet for Healthcare Providers: BankingDealers.co.za  This test is not yet approved or  cleared by the [place] FDA and has been authorized for detection and/or diagnosis of SARS-CoV-2 by FDA under an Emergency Use Authorization (EUA).  This EUA will remain in effect (meaning this test can be used) for the duration of the COVID-19 declaration under Section 564(b)(1) of the Act, 21 U.S.C. section 360bbb-3(b)(1), unless the authorization is terminated or revoked sooner.  Performed at [facility], [place] [address], [place], [place] [address]   MRSA PCR Screening     Status: None   Collection Time: 01/07/20  3:53 AM   Specimen: Nasal Mucosa; Nasopharyngeal  Result Value Ref Range Status   MRSA by PCR NEGATIVE NEGATIVE Final    Comment:        The GeneXpert MRSA Assay (FDA approved for NASAL specimens only), is one component of a comprehensive MRSA colonization surveillance program. It is not intended to diagnose MRSA infection nor to guide or monitor treatment for MRSA infections. Performed at [facility], [place] [address] [place], [place] [address]      Medications:   . calcitRIOL  2 mcg Oral Q M,W,F  . Chlorhexidine Gluconate Cloth  6 each Topical Daily  . docusate sodium  100 mg Oral Daily  . metoprolol tartrate  25 mg Oral BID  . pantoprazole (PROTONIX) IV  40 mg Intravenous Daily  . polyethylene glycol  17 g Oral Daily  . senna  1 tablet Oral Daily  .  simethicone  80 mg Oral TID  . sodium chloride flush  10-40 mL Intracatheter Q12H  . Warfarin - Pharmacist Dosing Inpatient   Does not apply q1600   Continuous Infusions: . heparin 1,250 Units/hr (01/14/20 0227)      LOS: 8 days   Charlynne Cousins  [facility]  01/14/2020, 8:35 AM

## 2020-01-15 LAB — CBC
HCT: 27.2 % — ABNORMAL LOW (ref 36.0–46.0)
Hemoglobin: 8.8 g/dL — ABNORMAL LOW (ref 12.0–15.0)
MCH: 31.1 pg (ref 26.0–34.0)
MCHC: 32.4 g/dL (ref 30.0–36.0)
MCV: 96.1 fL (ref 80.0–100.0)
Platelets: 257 10*3/uL (ref 150–400)
RBC: 2.83 MIL/uL — ABNORMAL LOW (ref 3.87–5.11)
RDW: 15.4 % (ref 11.5–15.5)
WBC: 7.8 10*3/uL (ref 4.0–10.5)
nRBC: 0 % (ref 0.0–0.2)

## 2020-01-15 LAB — HEPARIN LEVEL (UNFRACTIONATED): Heparin Unfractionated: 0.33 IU/mL (ref 0.30–0.70)

## 2020-01-15 LAB — RENAL FUNCTION PANEL
Albumin: 2.7 g/dL — ABNORMAL LOW (ref 3.5–5.0)
Anion gap: 15 (ref 5–15)
BUN: 46 mg/dL — ABNORMAL HIGH (ref 6–20)
CO2: 24 mmol/L (ref 22–32)
Calcium: 9.7 mg/dL (ref 8.9–10.3)
Chloride: 93 mmol/L — ABNORMAL LOW (ref 98–111)
Creatinine, Ser: 6.99 mg/dL — ABNORMAL HIGH (ref 0.44–1.00)
GFR calc Af Amer: 7 mL/min — ABNORMAL LOW (ref >60)
GFR calc non Af Amer: 6 mL/min — ABNORMAL LOW (ref >60)
Glucose, Bld: 87 mg/dL (ref 70–99)
Phosphorus: 4.2 mg/dL (ref 2.5–4.6)
Potassium: 4.4 mmol/L (ref 3.5–5.1)
Sodium: 132 mmol/L — ABNORMAL LOW (ref 135–145)

## 2020-01-15 LAB — PROTIME-INR
INR: 1.1 (ref 0.8–1.2)
Prothrombin Time: 13.4 seconds (ref 11.4–15.2)

## 2020-01-15 MED ORDER — OXYCODONE-ACETAMINOPHEN 5-325 MG PO TABS
1.0000 | ORAL_TABLET | ORAL | Status: DC | PRN
  Administered 2020-01-15 – 2020-01-24 (×9): 1 via ORAL
  Filled 2020-01-15 (×9): qty 1

## 2020-01-15 MED ORDER — CHLORHEXIDINE GLUCONATE CLOTH 2 % EX PADS
6.0000 | MEDICATED_PAD | Freq: Every day | CUTANEOUS | Status: DC
  Administered 2020-01-16 – 2020-01-17 (×2): 6 via TOPICAL

## 2020-01-15 MED ORDER — WARFARIN SODIUM 7.5 MG PO TABS
7.5000 mg | ORAL_TABLET | Freq: Once | ORAL | Status: AC
  Administered 2020-01-15: 7.5 mg via ORAL
  Filled 2020-01-15: qty 1

## 2020-01-15 NOTE — Progress Notes (Signed)
Physical Therapy Treatment Patient Details Name: [name] MRN: [id number] DOB: 1960/08/17 Today's Date: 01/15/2020    History of Present Illness Patient is a 59 year old female with history of ESRD on HD MWF, s/p aortic and mitral valve replacement on warfarin therapy who presented with nausea, nonbloody emesis and abdominal pain; found to have spontaneous left retroperitoneal bleed.    PT Comments    Pt was seen for mobility with LE strengthening and then agreed to walk.  Positioned with heating packs after as pt is uncomfortable with back and getting relief with them.  Follow acutely to continue to maintain and mildly challenge with mobility to go home with better tolerance for daily activity.     Follow Up Recommendations  No PT follow up;Supervision - Intermittent     Equipment Recommendations  None recommended by PT    Recommendations for Other Services       Precautions / Restrictions Precautions Precautions: Fall Precaution Comments: easily nauseated Restrictions Weight Bearing Restrictions: No    Mobility  Bed Mobility Overal bed mobility: Modified Independent                Transfers Overall transfer level: Needs assistance Equipment used: None Transfers: Sit to/from Stand;Stand Pivot Transfers Sit to Stand: Supervision Stand pivot transfers: Supervision          Ambulation/Gait Ambulation/Gait assistance: Supervision Gait Distance (Feet): 225 Feet Assistive device: None Gait Pattern/deviations: Step-through pattern;Decreased stride length Gait velocity: reduced Gait velocity interpretation: <1.31 ft/sec, indicative of household ambulator General Gait Details: controlled pace due to spine pain   Stairs             Wheelchair Mobility    Modified Rankin (Stroke Patients Only)       Balance Overall balance assessment: No apparent balance deficits (not formally assessed)                                           Cognition Arousal/Alertness: Awake/alert Behavior During Therapy: WFL for tasks assessed/performed Overall Cognitive Status: Within Functional Limits for tasks assessed                                        Exercises General Exercises - Lower Extremity Long Arc Quad: Strengthening;10 reps Heel Slides: Strengthening;10 reps Hip ABduction/ADduction: Strengthening;10 reps    General Comments        Pertinent Vitals/Pain Pain Assessment: Faces Faces Pain Scale: Hurts little more Pain Location: back Pain Descriptors / Indicators: Grimacing;Guarding Pain Intervention(s): Heat applied;Repositioned;Limited activity within patient's tolerance    Home Living                      Prior Function            PT Goals (current goals can now be found in the care plan section) Acute Rehab PT Goals Patient Stated Goal: manage her symptoms Progress towards PT goals: Progressing toward goals    Frequency    Min 2X/week      PT Plan Current plan remains appropriate    Co-evaluation              AM-PAC PT "6 Clicks" Mobility   Outcome Measure  Help needed turning from your back to your side while in a  flat bed without using bedrails?: None Help needed moving from lying on your back to sitting on the side of a flat bed without using bedrails?: None Help needed moving to and from a bed to a chair (including a wheelchair)?: None Help needed standing up from a chair using your arms (e.g., wheelchair or bedside chair)?: None Help needed to walk in hospital room?: A Little Help needed climbing 3-5 steps with a railing? : A Little 6 Click Score: 22    End of Session Equipment Utilized During Treatment: Gait belt Activity Tolerance: Patient tolerated treatment well Patient left: in bed;with call bell/phone within reach Nurse Communication: Mobility status PT Visit Diagnosis: Muscle weakness (generalized) (M62.81)     Time: 1610-9604 PT  Time Calculation (min) (ACUTE ONLY): 22 min  Charges:  $Gait Training: 8-22 mins                  Ramond Dial 01/15/2020, 9:56 PM  Mee Hives, PT MS Acute Rehab Dept. Number: [place]

## 2020-01-15 NOTE — Progress Notes (Signed)
[name]  Subjective: had 2 "normal" BM's yesterday. No further BRBPR. Feeling well, Hb 8.8   Vitals:   01/14/20 2046 01/14/20 2344 01/15/20 0440 01/15/20 0500  BP: (!) 152/86 (!) 156/95 (!) 149/88   Pulse: 94 97 86   Resp: 15  18   Temp: 99.1 F (37.3 C) 98.4 F (36.9 C) 98.4 F (36.9 C)   TempSrc: Oral Oral Oral   SpO2: 98% 100% 100%   Weight:    64 kg  Height:        Exam: Gen: No acute distress, comfortable CVS: tachycardic, +murmur, loud S2 Resp:unlabored, bl chest expansion, clear to auscultation bilaterally ABd: abd firm, mild tenderness L side , no rebound Ext:no edema Neuro: awake, alert, speech clear and coherent Access: rue avf with +b/t  OP HD: MWF HD  4h  61kg  450/800  2/2 bath  P2  Hep 1800  -sensipar 180mg  -calcitriol 29mcg -no esa, no iron    Assessment/ Plan:   1. L peri-renal bleed/ hematoma - by CT 8/18. S/p ffp, prbc, and vit k on admit, INR normalized. IV heparin restarted 8/21 then stopped 8/22 and given more prbc's (4 prbc total here), IV hep restarted again 8/23 - seen by urology, if rebleeds would embolize the L kidney - Hb remains stable, continues on IV hep and coumadin   2. Rectal bleeding - none further, normal BM x 2 yest 3. H/p AVR and MVR on chronic anticoagulation 4. ESRD - usual HD is MWF. HD today.  5. Secondary hyperparathyroidism. Limit phos in diet, sensipar and calcitriol (phos 5.4 today).  On Tums which is reasonable in the setting of indigestion and hyperphosphatemia 6. HTN - BP's stable, up 2-3kg, on home metoprolol    Connie Splinter, MD 01/14/2020, 11:41 AM        Subjective:      Objective:   BP 137/75 (BP Location: Left Arm)   Pulse 90   Temp 99 F (37.2 C) (Oral)   Resp 17   Ht 5\' 6"  (1.676 m)   Wt 63 kg   LMP 12/05/2010 (LMP Unknown)   SpO2 98%   BMI 22.42 kg/m   Intake/Output Summary (Last 24 hours) at 01/14/2020 1141 Last data filed at 01/14/2020 0939 Gross per 24  hour  Intake 1327.22 ml  Output 2501 ml  Net -1173.78 ml   Weight change: 0.1 kg  Physical Exam:     [name] 01/15/2020, 9:12 AM   Recent Labs  Lab 01/14/20 0715 01/14/20 0715 01/14/20 1744 01/15/20 0532  K 4.2  --   --  4.4  BUN 30*  --   --  46*  CREATININE 4.98*  --   --  6.99*  CALCIUM 9.6  --   --  9.7  PHOS 3.6  --   --  4.2  HGB 8.5*   < > 9.0* 8.8*   < > = values in this interval not displayed.   Inpatient medications: . calcitRIOL  2 mcg Oral Q M,W,F  . Chlorhexidine Gluconate Cloth  6 each Topical Daily  . docusate sodium  100 mg Oral Daily  . metoprolol tartrate  25 mg Oral BID  . pantoprazole (PROTONIX) IV  40 mg Intravenous Daily  . polyethylene glycol  17 g Oral BID  . senna  1 tablet Oral Daily  . simethicone  80 mg Oral TID  . sodium chloride flush  10-40 mL Intracatheter Q12H  . warfarin  7.5  mg Oral ONCE-1600  . Warfarin - Pharmacist Dosing Inpatient   Does not apply q1600   . heparin 1,150 Units/hr (01/14/20 2341)   calcium carbonate, ondansetron, oxyCODONE-acetaminophen, sodium chloride flush

## 2020-01-15 NOTE — Procedures (Signed)
   I was present at this dialysis session, have reviewed the session itself and made  appropriate changes [name] MD [place] pager [phone]   01/15/2020, 9:15 AM

## 2020-01-15 NOTE — Progress Notes (Signed)
Progress Note  Patient Name: [name] Date of Encounter: 01/15/2020  Primary Cardiologist: Dorris Carnes, MD  Subjective   Feeling well today, no specific complaints   Inpatient Medications    Scheduled Meds: . calcitRIOL  2 mcg Oral Q M,W,F  . Chlorhexidine Gluconate Cloth  6 each Topical Daily  . docusate sodium  100 mg Oral Daily  . metoprolol tartrate  25 mg Oral BID  . pantoprazole (PROTONIX) IV  40 mg Intravenous Daily  . polyethylene glycol  17 g Oral BID  . senna  1 tablet Oral Daily  . simethicone  80 mg Oral TID  . sodium chloride flush  10-40 mL Intracatheter Q12H  . warfarin  7.5 mg Oral ONCE-1600  . Warfarin - Pharmacist Dosing Inpatient   Does not apply q1600   Continuous Infusions: . heparin 1,150 Units/hr (01/14/20 2341)   PRN Meds: calcium carbonate, ondansetron, oxyCODONE-acetaminophen, sodium chloride flush   Vital Signs    Vitals:   01/15/20 0811 01/15/20 0830 01/15/20 0900 01/15/20 0930  BP: (!) 142/86 (!) 141/81 131/83 126/76  Pulse: 88 91 94 90  Resp: 14 19 18 19   Temp:      TempSrc:      SpO2:      Weight:      Height:        Intake/Output Summary (Last 24 hours) at 01/15/2020 0955 Last data filed at 01/15/2020 0400 Gross per 24 hour  Intake 1060.29 ml  Output 0 ml  Net 1060.29 ml   Filed Weights   01/14/20 0354 01/15/20 0500 01/15/20 0802  Weight: 63 kg 64 kg 64.4 kg    Physical Exam   General: Well developed, well nourished, NAD Neck: Negative for carotid bruits. No JVD Lungs:Clear to ausculation bilaterally. Breathing is unlabored. Cardiovascular: RRR with S1 S2. No murmurs Abdomen: Soft, non-tender, non-distended. No obvious abdominal masses. Extremities: No edema. Radial pulses 2+ bilaterally Neuro: Alert and oriented. No focal deficits. No facial asymmetry. MAE spontaneously. Psych: Responds to questions appropriately with normal affect.    Labs    Chemistry Recent Labs  Lab 01/13/20 0102 01/14/20 0715  01/15/20 0532  NA 131* 132* 132*  K 3.8 4.2 4.4  CL 92* 93* 93*  CO2 23 25 24   GLUCOSE 90 92 87  BUN 46* 30* 46*  CREATININE 7.42* 4.98* 6.99*  CALCIUM 9.1 9.6 9.7  ALBUMIN 2.6* 2.6* 2.7*  GFRNONAA 6* 9* 6*  GFRAA 6* 10* 7*  ANIONGAP 16* 14 15     Hematology Recent Labs  Lab 01/14/20 0715 01/14/20 1744 01/15/20 0532  WBC 8.1 8.5 7.8  RBC 2.78* 2.88* 2.83*  HGB 8.5* 9.0* 8.8*  HCT 26.8* 27.8* 27.2*  MCV 96.4 96.5 96.1  MCH 30.6 31.3 31.1  MCHC 31.7 32.4 32.4  RDW 15.5 15.6* 15.4  PLT 224 257 257    Cardiac EnzymesNo results for input(s): TROPONINI in the last 168 hours. No results for input(s): TROPIPOC in the last 168 hours.   BNPNo results for input(s): BNP, PROBNP in the last 168 hours.   DDimer No results for input(s): DDIMER in the last 168 hours.   Radiology    No results found.  Telemetry    01/14/20 NSR- Personally Reviewed  ECG    No new tracing as of 01/14/20- Personally Reviewed  Cardiac Studies   TTE 03/12/2019 1. Left ventricular ejection fraction, by visual estimation, is 65 to  70%. The left ventricle has normal function. Normal  left ventricular size.  There is mildly increased left ventricular hypertrophy.  2. Global right ventricle has normal systolic function.The right  ventricular size is normal. No increase in right ventricular wall  thickness.  3. Left atrial size was mildly dilated.  4. Right atrial size was normal.  5. The mitral valve has been repaired/replaced. No evidence of mitral  valve regurgitation. No evidence of mitral stenosis.  6. [employer] Optiform bileaflet mechanical valve (size 31 mm, cat  # [id number], serial # [id number]).   Mean gradient 98mmHg.  7. The tricuspid valve is normal in structure. Tricuspid valve  regurgitation is trivial.  8. Aortic valve regurgitation was not visualized by color flow Doppler.  Structurally normal aortic valve, with no evidence of sclerosis or  stenosis.  9.  Mechanical prosthesis in the aortic valve position.  10. [employer] Top-Hat bileaflet mechanical valve (size 21 mm, cat  # [id number], serial # [id number]).   Peak velocity: 2.43m/s, mean gradient 22mmHg.  11. The pulmonic valve was normal in structure. Pulmonic valve  regurgitation is not visualized by color flow Doppler.  12. Normal pulmonary artery systolic pressure.  13. The inferior vena cava is normal in size with greater than 50%  respiratory variability, suggesting right atrial pressure of 3 mmHg  Patient Profile     59 y.o. female with ESRD, rheumatic AS/MS s/p mechanical AVR/MVR, HTN, lupus, admitted 01/06/2020 with spontaneous RP bleed.   Assessment & Plan    1.  Acute retroperitoneal bleed with acute blood loss anemia: Hgb is staying steady now Pt has been seen by renal and urology   For any further bleeding urology would plan intervening  Resuming anticoagulant   2  BRBPR   Pt had 1 spell yesterday  ? Hemorrhoid  No further spells     2.  Mechanical MVR/AVR: -Resume heparin a heparin and coumadin loading    3.  ESRD: -HD MWF -Management per nephrology  4.  Hypertension: BP is a little high today but going for dialysis   No change  5.  Tachycardia: HR is improved   Keep on metoprolol     Will follow   For questions or updates, please contact   Please consult [email or web address] for contact info under Cardiology/STEMI.

## 2020-01-15 NOTE — Progress Notes (Addendum)
ANTICOAGULATION CONSULT NOTE - Follow Up Consult  Pharmacy Consult for Heparin + Coumadin Indication: artificial heart valves  No Known Allergies  Patient Measurements: Height: 5\' 6"  (167.6 cm) Weight: 64 kg (141 lb 1.5 oz) (scale A) IBW/kg (Calculated) : 59.3 Heparin Dosing Weight:    Vital Signs: Temp: 98.4 F (36.9 C) (08/27 0440) Temp Source: Oral (08/27 0440) BP: 149/88 (08/27 0440) Pulse Rate: 86 (08/27 0440)  Labs: Recent Labs    01/12/20 1757 01/13/20 0102 01/13/20 1122 01/13/20 1316 01/14/20 0715 01/14/20 0715 01/14/20 0834 01/14/20 1744 01/15/20 0532  HGB   < >  --   --   --  8.5*   < >  --  9.0* 8.8*  HCT   < >  --   --   --  26.8*  --   --  27.8* 27.2*  PLT   < >  --   --   --  224  --   --  257 257  LABPROT  --   --   --   --   --   --  13.8  --  13.4  INR  --   --   --   --   --   --  1.1  --  1.1  HEPARINUNFRC  --  0.32   < > 0.39 0.46  --   --   --  0.33  CREATININE  --  7.42*  --   --  4.98*  --   --   --  6.99*   < > = values in this interval not displayed.    Estimated Creatinine Clearance: 8.3 mL/min (A) (by C-G formula based on SCr of 6.99 mg/dL (H)).   Assessment: Anticoag: s/p RP hematoma. Warfarin PTA for hx mech AVR/MVR (goal 2.5-3.5) reversed vit K 10mg  IV + 2 units FFP on 8/18.  -Heparin resumed on 8/23 and resumed warfarin 8/25. Noted with BRBPR on 8/26 and no further blood has been noted.  -heparin level= 0.33, INR= 1.1, hg= 8.8 (stable)  Noted with BRBPR. Spoke with Dr. Olevia Bowens: continue heparin and warfarin. Plans to check CBC at 5pm  - PTA Warfarin: 3 mg daily except 6 mg on Mondays- last 8/18  Goal of Therapy:  Heparin level 0.3-0.5 units/ml Monitor platelets by anticoagulation protocol: Yes   Plan:  - Continue heparin1150 units/hr - Coumadin 7.5mg  po x 1 today - Daily HL, CBC, INR  Hildred Laser, PharmD Clinical Pharmacist **Pharmacist phone directory can now be found on [email or web address] ([email or web address]).  Listed under [place].

## 2020-01-15 NOTE — Progress Notes (Addendum)
[facility] PROGRESS NOTE    Progress Note  [name]  SHF:[id number] DOB: March 20, 1961 DOA: 01/06/2020 PCP: Benito Mccreedy, MD     Brief Narrative:   [name] is an 59 y.o. female past medical history of end-stage renal disease on hemodialysis Monday Wednesday and Friday status post aortic and mitral valve replacement on Coumadin who presents with nausea and nonbloody emesis, she was found to have a spontaneous left retroperitoneal bleed she status post 1 unit of packed red blood cells 2 units of fresh frozen plasma and vitamin K,  Assessment/Plan:   Acute blood loss anemia secondary to spontaneous left retroperitoneal bleed while on Coumadin: Baseline hemoglobin around 8-9, low on admission she was transfused several units. Transfuse as needed if hemoglobin less than 7 or symptomatic. She is currently on IV heparin, starting oral Coumadin today goal INR 2-3. No further bloody bowel movements her hemoglobin has remained stable, continue IV heparin and Coumadin. INR continues to be subtherapeutic.  Aortic and mitral valve stenosis status post AVR and MVR: Continue heparin per pharmacy appreciate assistance. Start Coumadin per pharmacy.  End-stage renal disease on Monday Wednesdays and Fridays: Continue HD per nephrology.  Prolonged QTC: Continue to monitor limit QT prolonging drugs.  Constipation: Continue Senokot and MiraLAX, she relates her last bowel movement was about 7 days ago.   DVT prophylaxis: heparin & coumadin Family Communication:none Status is: Inpatient  Remains inpatient appropriate because:Hemodynamically unstable   Dispo: The patient is from: Home              Anticipated d/c is to: Home              Anticipated d/c date is: 3 days              Patient currently is not medically stable to d/c.        Code Status:     Code Status Orders  (From admission, onward)         Start     Ordered   01/06/20 1945  Full  code  Continuous        01/06/20 1946        Code Status History    Date Active Date Inactive Code Status Order ID Comments User Context   01/06/2019 1833 01/09/2019 2258 Full Code [id number]  Merton Border, MD Inpatient   12/02/2018 1402 12/11/2018 1548 Full Code [id number]  Rexene Alberts, MD Inpatient   07/24/2018 0951 07/24/2018 1652 Full Code [id number]  Larey Dresser, MD Inpatient   03/14/2018 2044 03/15/2018 2213 Full Code [id number]  Shela Leff, MD ED   10/21/2017 2044 10/25/2017 1848 Full Code [id number]  Vianne Bulls, MD ED   01/27/2017 0435 01/30/2017 1756 Full Code [id number]  Quintella Baton, MD ED   10/09/2016 1548 10/10/2016 2135 Full Code [id number]  Latanya [name], MD Inpatient   05/01/2015 2236 05/05/2015 1850 Full Code [id number]  Norval Morton, MD Inpatient   07/25/2014 2354 08/01/2014 1724 Full Code [id number]  Allie Bossier, MD ED   Advance Care Planning Activity        IV Access:    Peripheral IV   Procedures and diagnostic studies:   No results found.   Medical Consultants:    None.  Anti-Infectives:   none  Subjective:    [name] no further bloody bowel movement.  Objective:    Vitals:   01/14/20 2046 01/14/20 2344 01/15/20 0440 01/15/20 0500  BP: (!) 152/86 [name])  156/95 (!) 149/88   Pulse: 94 97 86   Resp: 15  18   Temp: 99.1 F (37.3 C) 98.4 F (36.9 C) 98.4 F (36.9 C)   TempSrc: Oral Oral Oral   SpO2: 98% 100% 100%   Weight:    64 kg  Height:       SpO2: 100 % O2 Flow Rate (L/min): 2 L/min   Intake/Output Summary (Last 24 hours) at 01/15/2020 0811 Last data filed at 01/15/2020 0400 Gross per 24 hour  Intake 1280.29 ml  Output 1 ml  Net 1279.29 ml   Filed Weights   01/13/20 0830 01/14/20 0354 01/15/20 0500  Weight: 65 kg 63 kg 64 kg    Exam: General exam: In no acute distress. Respiratory system: Good air movement and clear to auscultation. Cardiovascular system: S1 & S2 heard, RRR. No JVD. Gastrointestinal  system: Abdomen is nondistended, soft and nontender.  Extremities: No pedal edema. Skin: No rashes, lesions or ulcers  Data Reviewed:    Labs: Basic Metabolic Panel: Recent Labs  Lab 01/11/20 0540 01/11/20 0540 01/12/20 0436 01/12/20 0436 01/13/20 0102 01/13/20 0102 01/14/20 0715 01/15/20 0532  NA 130*  --  135  --  131*  --  132* 132*  K 3.9   < > 3.7   < > 3.8   < > 4.2 4.4  CL 88*  --  92*  --  92*  --  93* 93*  CO2 25  --  27  --  23  --  25 24  GLUCOSE 82  --  91  --  90  --  92 87  BUN 49*  --  28*  --  46*  --  30* 46*  CREATININE 8.58*  --  5.27*  --  7.42*  --  4.98* 6.99*  CALCIUM 9.1  --  9.5  --  9.1  --  9.6 9.7  PHOS 5.7*  --  3.9  --  4.1  --  3.6 4.2   < > = values in this interval not displayed.   GFR Estimated Creatinine Clearance: 8.3 mL/min (A) (by C-G formula based on SCr of 6.99 mg/dL (H)). Liver Function Tests: Recent Labs  Lab 01/11/20 0540 01/12/20 0436 01/13/20 0102 01/14/20 0715 01/15/20 0532  ALBUMIN 2.6* 2.6* 2.6* 2.6* 2.7*   No results for input(s): LIPASE, AMYLASE in the last 168 hours. No results for input(s): AMMONIA in the last 168 hours. Coagulation profile Recent Labs  Lab 01/09/20 0414 01/10/20 0527 01/11/20 0539 01/14/20 0834 01/15/20 0532  INR 1.2 1.2 1.1 1.1 1.1   COVID-19 Labs  No results for input(s): DDIMER, FERRITIN, LDH, CRP in the last 72 hours.  Lab Results  Component Value Date   SARSCOV2NAA NEGATIVE 01/06/2020   [place] NEGATIVE 01/06/2019   [place] NEGATIVE 11/28/2018    CBC: Recent Labs  Lab 01/08/20 1542 01/09/20 0414 01/12/20 1422 01/12/20 1757 01/14/20 0715 01/14/20 1744 01/15/20 0532  WBC 18.1*   < > 9.7 10.1 8.1 8.5 7.8  NEUTROABS 16.3*  --   --   --   --   --   --   HGB 7.8*   < > 8.6* 8.8* 8.5* 9.0* 8.8*  HCT 23.1*   < > 26.9* 27.7* 26.8* 27.8* 27.2*  MCV 93.1   < > 97.8 96.2 96.4 96.5 96.1  PLT 123*   < > 201 199 224 257 257   < > = values in this interval not displayed.  Cardiac Enzymes: No results for input(s): CKTOTAL, CKMB, CKMBINDEX, TROPONINI in the last 168 hours. BNP (last 3 results) No results for input(s): PROBNP in the last 8760 hours. CBG: No results for input(s): GLUCAP in the last 168 hours. D-Dimer: No results for input(s): DDIMER in the last 72 hours. Hgb A1c: No results for input(s): HGBA1C in the last 72 hours. Lipid Profile: No results for input(s): CHOL, HDL, LDLCALC, TRIG, CHOLHDL, LDLDIRECT in the last 72 hours. Thyroid function studies: No results for input(s): TSH, T4TOTAL, T3FREE, THYROIDAB in the last 72 hours.  Invalid input(s): FREET3 Anemia work up: No results for input(s): VITAMINB12, FOLATE, FERRITIN, TIBC, IRON, RETICCTPCT in the last 72 hours. Sepsis Labs: Recent Labs  Lab 01/12/20 1757 01/14/20 0715 01/14/20 1744 01/15/20 0532  WBC 10.1 8.1 8.5 7.8   Microbiology Recent Results (from the past 240 hour(s))  SARS Coronavirus 2 by RT PCR (hospital order, performed in [facility] hospital lab) Nasopharyngeal Nasopharyngeal Swab     Status: None   Collection Time: 01/06/20  5:20 PM   Specimen: Nasopharyngeal Swab  Result Value Ref Range Status   SARS Coronavirus 2 NEGATIVE NEGATIVE Final    Comment: (NOTE) SARS-CoV-2 target nucleic acids are NOT DETECTED.  The SARS-CoV-2 RNA is generally detectable in upper and lower respiratory specimens during the acute phase of infection. The lowest concentration of SARS-CoV-2 viral copies this assay can detect is 250 copies / mL. A negative result does not preclude SARS-CoV-2 infection and should not be used as the sole basis for treatment or other patient management decisions.  A negative result may occur with improper specimen collection / handling, submission of specimen other than nasopharyngeal swab, presence of viral mutation(s) within the areas targeted by this assay, and inadequate number of viral copies (<250 copies / mL). A negative result must be combined  with clinical observations, patient history, and epidemiological information.  Fact Sheet for Patients:   StrictlyIdeas.no  Fact Sheet for Healthcare Providers: BankingDealers.co.za  This test is not yet approved or  cleared by the [place] FDA and has been authorized for detection and/or diagnosis of SARS-CoV-2 by FDA under an Emergency Use Authorization (EUA).  This EUA will remain in effect (meaning this test can be used) for the duration of the COVID-19 declaration under Section 564(b)(1) of the Act, 21 U.S.C. section 360bbb-3(b)(1), unless the authorization is terminated or revoked sooner.  Performed at [facility], [place] [address] [place], [place] [address]   MRSA PCR Screening     Status: None   Collection Time: 01/07/20  3:53 AM   Specimen: Nasal Mucosa; Nasopharyngeal  Result Value Ref Range Status   MRSA by PCR NEGATIVE NEGATIVE Final    Comment:        The GeneXpert MRSA Assay (FDA approved for NASAL specimens only), is one component of a comprehensive MRSA colonization surveillance program. It is not intended to diagnose MRSA infection nor to guide or monitor treatment for MRSA infections. Performed at [facility], [place] [address] [place], [place] [address]      Medications:   . calcitRIOL  2 mcg Oral Q M,W,F  . Chlorhexidine Gluconate Cloth  6 each Topical Daily  . docusate sodium  100 mg Oral Daily  . metoprolol tartrate  25 mg Oral BID  . pantoprazole (PROTONIX) IV  40 mg Intravenous Daily  . polyethylene glycol  17 g Oral BID  . senna  1 tablet Oral Daily  . simethicone  80 mg Oral TID  . sodium chloride flush  10-40 mL Intracatheter Q12H  . Warfarin - Pharmacist Dosing Inpatient   Does not apply q1600   Continuous Infusions: . heparin 1,150 Units/hr (01/14/20 2341)      LOS: 9 days   Charlynne Cousins  [facility]  01/15/2020, 8:11 AM

## 2020-01-16 LAB — CBC
HCT: 31.6 % — ABNORMAL LOW (ref 36.0–46.0)
Hemoglobin: 10.1 g/dL — ABNORMAL LOW (ref 12.0–15.0)
MCH: 31.3 pg (ref 26.0–34.0)
MCHC: 32 g/dL (ref 30.0–36.0)
MCV: 97.8 fL (ref 80.0–100.0)
Platelets: 323 10*3/uL (ref 150–400)
RBC: 3.23 MIL/uL — ABNORMAL LOW (ref 3.87–5.11)
RDW: 15.8 % — ABNORMAL HIGH (ref 11.5–15.5)
WBC: 8.2 10*3/uL (ref 4.0–10.5)
nRBC: 0 % (ref 0.0–0.2)

## 2020-01-16 LAB — RENAL FUNCTION PANEL
Albumin: 2.8 g/dL — ABNORMAL LOW (ref 3.5–5.0)
Anion gap: 16 — ABNORMAL HIGH (ref 5–15)
BUN: 25 mg/dL — ABNORMAL HIGH (ref 6–20)
CO2: 27 mmol/L (ref 22–32)
Calcium: 10.2 mg/dL (ref 8.9–10.3)
Chloride: 90 mmol/L — ABNORMAL LOW (ref 98–111)
Creatinine, Ser: 4.59 mg/dL — ABNORMAL HIGH (ref 0.44–1.00)
GFR calc Af Amer: 11 mL/min — ABNORMAL LOW (ref >60)
GFR calc non Af Amer: 10 mL/min — ABNORMAL LOW (ref >60)
Glucose, Bld: 96 mg/dL (ref 70–99)
Phosphorus: 4 mg/dL (ref 2.5–4.6)
Potassium: 3.7 mmol/L (ref 3.5–5.1)
Sodium: 133 mmol/L — ABNORMAL LOW (ref 135–145)

## 2020-01-16 LAB — PROTIME-INR
INR: 1.2 (ref 0.8–1.2)
Prothrombin Time: 14.6 seconds (ref 11.4–15.2)

## 2020-01-16 LAB — HEPARIN LEVEL (UNFRACTIONATED)
Heparin Unfractionated: 2.02 IU/mL — ABNORMAL HIGH (ref 0.30–0.70)
Heparin Unfractionated: 0.17 IU/mL — ABNORMAL LOW (ref 0.30–0.70)
Heparin Unfractionated: 0.33 IU/mL (ref 0.30–0.70)

## 2020-01-16 MED ORDER — SODIUM CHLORIDE 0.9 % IV SOLN: INTRAVENOUS | Status: DC | PRN

## 2020-01-16 MED ORDER — HEPARIN (PORCINE) 25000 UT/250ML-% IV SOLN
900.0000 [IU]/h | INTRAVENOUS | Status: DC
  Administered 2020-01-17: 1150 [IU]/h via INTRAVENOUS
  Administered 2020-01-18 – 2020-01-24 (×7): 1000 [IU]/h via INTRAVENOUS
  Administered 2020-01-25: 900 [IU]/h via INTRAVENOUS
  Filled 2020-01-16 (×9): qty 250

## 2020-01-16 MED ORDER — WARFARIN SODIUM 7.5 MG PO TABS
7.5000 mg | ORAL_TABLET | Freq: Once | ORAL | Status: AC
  Administered 2020-01-16: 7.5 mg via ORAL
  Filled 2020-01-16: qty 1

## 2020-01-16 MED ORDER — HEPARIN (PORCINE) 25000 UT/250ML-% IV SOLN: 1000.0000 [IU]/h | INTRAVENOUS | Status: DC

## 2020-01-16 NOTE — Progress Notes (Signed)
[facility] PROGRESS NOTE    Progress Note  Louisa Favaro  NAT:[id number] DOB: [birth year] DOA: 01/06/2020 PCP: Benito Mccreedy, MD     Brief Narrative:   [name] is an 59 y.o. female past medical history of end-stage renal disease on hemodialysis Monday Wednesday and Friday status post aortic and mitral valve replacement on Coumadin who presents with nausea and nonbloody emesis, she was found to have a spontaneous left retroperitoneal bleed she status post 1 unit of packed red blood cells 2 units of fresh frozen plasma and vitamin K,  Assessment/Plan:   Acute blood loss anemia secondary to spontaneous left retroperitoneal bleed while on Coumadin: Baseline hemoglobin around 8-9, low on admission she was transfused several units. Transfuse as needed if hemoglobin less than 7 or symptomatic. She is currently on IV heparin, starting oral Coumadin today goal INR 2.5-3.5 No further bloody bowel movements her hemoglobin has remained stable, continue IV heparin and Coumadin. INR continues to be subtherapeutic.  Aortic and mitral valve stenosis status post AVR and MVR: Continue heparin per pharmacy appreciate assistance. Start Coumadin per pharmacy.  End-stage renal disease on Monday Wednesdays and Fridays: Continue HD per nephrology.  Prolonged QTC: Continue to monitor limit QT prolonging drugs.  Constipation: Continue Senokot and MiraLAX, she relates her last bowel movement was about 7 days ago.   DVT prophylaxis: heparin & coumadin Family Communication:none Status is: Inpatient  Remains inpatient appropriate because:Hemodynamically unstable   Dispo: The patient is from: Home              Anticipated d/c is to: Home              Anticipated d/c date is: 3 days              Patient currently is not medically stable to d/c.        Code Status:     Code Status Orders  (From admission, onward)         Start     Ordered   01/06/20 1945  Full  code  Continuous        01/06/20 1946        Code Status History    Date Active Date Inactive Code Status Order ID Comments User Context   01/06/2019 1833 01/09/2019 2258 Full Code [id number]  Merton Border, MD Inpatient   12/02/2018 1402 12/11/2018 1548 Full Code [id number]  Rexene Alberts, MD Inpatient   07/24/2018 0951 07/24/2018 1652 Full Code [id number]  Larey Dresser, MD Inpatient   03/14/2018 2044 03/15/2018 2213 Full Code [id number]  Shela Leff, MD ED   10/21/2017 2044 10/25/2017 1848 Full Code [id number]  Vianne Bulls, MD ED   01/27/2017 0435 01/30/2017 1756 Full Code [id number]  Quintella Baton, MD ED   10/09/2016 1548 10/10/2016 2135 Full Code [id number]  Latanya [name], MD Inpatient   05/01/2015 2236 05/05/2015 1850 Full Code [id number]  Norval Morton, MD Inpatient   07/25/2014 2354 08/01/2014 1724 Full Code [id number]  Allie Bossier, MD ED   Advance Care Planning Activity        IV Access:    Peripheral IV   Procedures and diagnostic studies:   No results found.   Medical Consultants:    None.  Anti-Infectives:   none  Subjective:    Roselie Cirigliano feels great no bloody bowel movements.  Objective:    Vitals:   01/15/20 2013 01/16/20 0054 01/16/20 0359 01/16/20 0903  BP: 133/74 123/73 [name])  142/98 122/71  Pulse: 93 87 98 (!) 106  Resp: 18 18 18 18   Temp: 98.3 F (36.8 C) 97.8 F (36.6 C) 98.5 F (36.9 C) 98.8 F (37.1 C)  TempSrc: Oral Oral Oral Oral  SpO2: 98% 98% 100% 98%  Weight:   62.1 kg   Height:       SpO2: 98 % O2 Flow Rate (L/min): 2 L/min   Intake/Output Summary (Last 24 hours) at 01/16/2020 0921 Last data filed at 01/16/2020 0300 Gross per 24 hour  Intake 724.1 ml  Output 3000 ml  Net -2275.9 ml   Filed Weights   01/15/20 0802 01/15/20 1145 01/16/20 0359  Weight: 64.4 kg 61.4 kg 62.1 kg    Exam: General exam: In no acute distress. Respiratory system: Good air movement and clear to auscultation. Cardiovascular system: S1  & S2 heard, RRR. No JVD. Gastrointestinal system: Abdomen is nondistended, soft and nontender.  Extremities: No pedal edema. Skin: No rashes, lesions or ulcers  Data Reviewed:    Labs: Basic Metabolic Panel: Recent Labs  Lab 01/12/20 0436 01/12/20 0436 01/13/20 0102 01/13/20 0102 01/14/20 0715 01/14/20 0715 01/15/20 0532 01/16/20 0251  NA 135  --  131*  --  132*  --  132* 133*  K 3.7   < > 3.8   < > 4.2   < > 4.4 3.7  CL 92*  --  92*  --  93*  --  93* 90*  CO2 27  --  23  --  25  --  24 27  GLUCOSE 91  --  90  --  92  --  87 96  BUN 28*  --  46*  --  30*  --  46* 25*  CREATININE 5.27*  --  7.42*  --  4.98*  --  6.99* 4.59*  CALCIUM 9.5  --  9.1  --  9.6  --  9.7 10.2  PHOS 3.9  --  4.1  --  3.6  --  4.2 4.0   < > = values in this interval not displayed.   GFR Estimated Creatinine Clearance: 12.7 mL/min (A) (by C-G formula based on SCr of 4.59 mg/dL (H)). Liver Function Tests: Recent Labs  Lab 01/12/20 0436 01/13/20 0102 01/14/20 0715 01/15/20 0532 01/16/20 0251  ALBUMIN 2.6* 2.6* 2.6* 2.7* 2.8*   No results for input(s): LIPASE, AMYLASE in the last 168 hours. No results for input(s): AMMONIA in the last 168 hours. Coagulation profile Recent Labs  Lab 01/10/20 0527 01/11/20 0539 01/14/20 0834 01/15/20 0532 01/16/20 0251  INR 1.2 1.1 1.1 1.1 1.2   COVID-19 Labs  No results for input(s): DDIMER, FERRITIN, LDH, CRP in the last 72 hours.  Lab Results  Component Value Date   SARSCOV2NAA NEGATIVE 01/06/2020   [place] NEGATIVE 01/06/2019   [place] NEGATIVE 11/28/2018    CBC: Recent Labs  Lab 01/12/20 1757 01/14/20 0715 01/14/20 1744 01/15/20 0532 01/16/20 0251  WBC 10.1 8.1 8.5 7.8 8.2  HGB 8.8* 8.5* 9.0* 8.8* 10.1*  HCT 27.7* 26.8* 27.8* 27.2* 31.6*  MCV 96.2 96.4 96.5 96.1 97.8  PLT 199 224 257 257 323   Cardiac Enzymes: No results for input(s): CKTOTAL, CKMB, CKMBINDEX, TROPONINI in the last 168 hours. BNP (last 3 results) No  results for input(s): PROBNP in the last 8760 hours. CBG: No results for input(s): GLUCAP in the last 168 hours. D-Dimer: No results for input(s): DDIMER in the last 72 hours. Hgb A1c: No results for input(s): HGBA1C  in the last 72 hours. Lipid Profile: No results for input(s): CHOL, HDL, LDLCALC, TRIG, CHOLHDL, LDLDIRECT in the last 72 hours. Thyroid function studies: No results for input(s): TSH, T4TOTAL, T3FREE, THYROIDAB in the last 72 hours.  Invalid input(s): FREET3 Anemia work up: No results for input(s): VITAMINB12, FOLATE, FERRITIN, TIBC, IRON, RETICCTPCT in the last 72 hours. Sepsis Labs: Recent Labs  Lab 01/14/20 0715 01/14/20 1744 01/15/20 0532 01/16/20 0251  WBC 8.1 8.5 7.8 8.2   Microbiology Recent Results (from the past 240 hour(s))  SARS Coronavirus 2 by RT PCR (hospital order, performed in [facility] hospital lab) Nasopharyngeal Nasopharyngeal Swab     Status: None   Collection Time: 01/06/20  5:20 PM   Specimen: Nasopharyngeal Swab  Result Value Ref Range Status   SARS Coronavirus 2 NEGATIVE NEGATIVE Final    Comment: (NOTE) SARS-CoV-2 target nucleic acids are NOT DETECTED.  The SARS-CoV-2 RNA is generally detectable in upper and lower respiratory specimens during the acute phase of infection. The lowest concentration of SARS-CoV-2 viral copies this assay can detect is 250 copies / mL. A negative result does not preclude SARS-CoV-2 infection and should not be used as the sole basis for treatment or other patient management decisions.  A negative result may occur with improper specimen collection / handling, submission of specimen other than nasopharyngeal swab, presence of viral mutation(s) within the areas targeted by this assay, and inadequate number of viral copies (<250 copies / mL). A negative result must be combined with clinical observations, patient history, and epidemiological information.  Fact Sheet for Patients:     StrictlyIdeas.no  Fact Sheet for Healthcare Providers: BankingDealers.co.za  This test is not yet approved or  cleared by the [place] FDA and has been authorized for detection and/or diagnosis of SARS-CoV-2 by FDA under an Emergency Use Authorization (EUA).  This EUA will remain in effect (meaning this test can be used) for the duration of the COVID-19 declaration under Section 564(b)(1) of the Act, 21 U.S.C. section 360bbb-3(b)(1), unless the authorization is terminated or revoked sooner.  Performed at [facility], [place] [address] [place], [place] [address]   MRSA PCR Screening     Status: None   Collection Time: 01/07/20  3:53 AM   Specimen: Nasal Mucosa; Nasopharyngeal  Result Value Ref Range Status   MRSA by PCR NEGATIVE NEGATIVE Final    Comment:        The GeneXpert MRSA Assay (FDA approved for NASAL specimens only), is one component of a comprehensive MRSA colonization surveillance program. It is not intended to diagnose MRSA infection nor to guide or monitor treatment for MRSA infections. Performed at [facility], [place] [address] [place], [place] [address]      Medications:   . calcitRIOL  2 mcg Oral Q M,W,F  . Chlorhexidine Gluconate Cloth  6 each Topical Daily  . docusate sodium  100 mg Oral Daily  . metoprolol tartrate  25 mg Oral BID  . pantoprazole (PROTONIX) IV  40 mg Intravenous Daily  . polyethylene glycol  17 g Oral BID  . senna  1 tablet Oral Daily  . simethicone  80 mg Oral TID  . warfarin  7.5 mg Oral ONCE-1600  . Warfarin - Pharmacist Dosing Inpatient   Does not apply q1600   Continuous Infusions: . sodium chloride    . heparin 1,150 Units/hr (01/16/20 0910)      LOS: 10 days   Charlynne Cousins  [facility]  01/16/2020, 9:21 AM

## 2020-01-16 NOTE — Progress Notes (Addendum)
ANTICOAGULATION CONSULT NOTE - Follow Up Consult  Pharmacy Consult for Heparin + Coumadin Indication: artificial heart valves  No Known Allergies  Patient Measurements: Height: 5\' 6"  (167.6 cm) Weight: 62.1 kg (136 lb 14.5 oz) (scale A) IBW/kg (Calculated) : 59.3 Heparin Dosing Weight:    Vital Signs: Temp: 98.5 F (36.9 C) (08/28 0359) Temp Source: Oral (08/28 0359) BP: 142/98 (08/28 0359) Pulse Rate: 98 (08/28 0359)  Labs: Recent Labs    01/14/20 0715 01/14/20 0715 01/14/20 0834 01/14/20 1744 01/14/20 1744 01/15/20 0532 01/16/20 0251  HGB 8.5*   < >  --  9.0*   < > 8.8* 10.1*  HCT 26.8*   < >  --  27.8*  --  27.2* 31.6*  PLT 224   < >  --  257  --  257 323  LABPROT  --   --  13.8  --   --  13.4 14.6  INR  --   --  1.1  --   --  1.1 1.2  HEPARINUNFRC 0.46  --   --   --   --  0.33 2.02*  CREATININE 4.98*  --   --   --   --  6.99* 4.59*   < > = values in this interval not displayed.    Estimated Creatinine Clearance: 12.7 mL/min (A) (by C-G formula based on SCr of 4.59 mg/dL (H)).   Assessment: Anticoag: s/p RP hematoma. Warfarin PTA for hx mech AVR/MVR (goal 2.5-3.5) reversed vit K 10mg  IV + 2 units FFP on 8/18.  -Heparin resumed on 8/23 and resumed warfarin 8/25. Noted with [facility] on 8/26 and no further blood has been noted.  -heparin level this morning was 2.02, supratheraputic. No sign of bleeding noted. CBC is stable. Level was drawn in the left arm, distal to the heparin infusion site. She has been stable on this dose for several days, so suspect issue with lab. Repeated heparin level. Heparin held for 1.5 hours. Repeat HL 0.17 is subtheraputic d/t holding heparin. Will resume her initial rate because she was previously stable and therapeutic at this rate.  INR= 1.2, hg= 10.1, HCT 31.6, PLT 323  - PTA Warfarin: 3 mg daily except 6 mg on Mondays- last 8/18  Goal of Therapy:  Heparin level 0.3-0.5 units/ml Monitor platelets by anticoagulation protocol: Yes    Plan:  - Continue Heparin 1150 U/hr - Coumadin 7.5mg  po x 1 today - Daily HL, CBC, INR  Norina Buzzard, PharmD PGY1 Pharmacy Resident 01/16/2020 7:22 AM  **Pharmacist phone directory can now be found on [email or web address] ([email or web address]).  Listed under [place].

## 2020-01-16 NOTE — Progress Notes (Signed)
Cardiology Progress Note  Patient ID: [name] MRN: [id number] DOB: 1961/04/02 Date of Encounter: 01/16/2020  Primary Cardiologist: Dorris Carnes, MD  Subjective   Chief Complaint: None.  Hemoglobin stable.  HPI: Hemoglobin stable.  Heparin level high.  On hold for now.  Likely was drawn off the same IV the heparin infusion was running through.  No complaints of flank pain.  ROS:  All other ROS reviewed and negative. Pertinent positives noted in the HPI.     Inpatient Medications  Scheduled Meds: . calcitRIOL  2 mcg Oral Q M,W,F  . Chlorhexidine Gluconate Cloth  6 each Topical Daily  . docusate sodium  100 mg Oral Daily  . metoprolol tartrate  25 mg Oral BID  . pantoprazole (PROTONIX) IV  40 mg Intravenous Daily  . polyethylene glycol  17 g Oral BID  . senna  1 tablet Oral Daily  . simethicone  80 mg Oral TID  . warfarin  7.5 mg Oral ONCE-1600  . Warfarin - Pharmacist Dosing Inpatient   Does not apply q1600   Continuous Infusions: . sodium chloride    . heparin     PRN Meds: sodium chloride, calcium carbonate, ondansetron, oxyCODONE-acetaminophen   Vital Signs   Vitals:   01/15/20 1621 01/15/20 2013 01/16/20 0054 01/16/20 0359  BP: 126/81 133/74 123/73 (!) 142/98  Pulse: 88 93 87 98  Resp: 16 18 18 18   Temp: 99.1 F (37.3 C) 98.3 F (36.8 C) 97.8 F (36.6 C) 98.5 F (36.9 C)  TempSrc: Oral Oral Oral Oral  SpO2: 96% 98% 98% 100%  Weight:    62.1 kg  Height:        Intake/Output Summary (Last 24 hours) at 01/16/2020 0902 Last data filed at 01/16/2020 0300 Gross per 24 hour  Intake 724.1 ml  Output 3000 ml  Net -2275.9 ml   Last 3 Weights 01/16/2020 01/15/2020 01/15/2020  Weight (lbs) 136 lb 14.5 oz 135 lb 5.8 oz 141 lb 15.6 oz  Weight (kg) 62.1 kg 61.4 kg 64.4 kg      Telemetry  Overnight telemetry shows sinus tachycardia low 100s, which I personally reviewed.   ECG  The most recent ECG shows sinus rhythm heart rate 99, which I personally  reviewed.   Physical Exam   Vitals:   01/15/20 1621 01/15/20 2013 01/16/20 0054 01/16/20 0359  BP: 126/81 133/74 123/73 (!) 142/98  Pulse: 88 93 87 98  Resp: 16 18 18 18   Temp: 99.1 F (37.3 C) 98.3 F (36.8 C) 97.8 F (36.6 C) 98.5 F (36.9 C)  TempSrc: Oral Oral Oral Oral  SpO2: 96% 98% 98% 100%  Weight:    62.1 kg  Height:         Intake/Output Summary (Last 24 hours) at 01/16/2020 0902 Last data filed at 01/16/2020 0300 Gross per 24 hour  Intake 724.1 ml  Output 3000 ml  Net -2275.9 ml    Last 3 Weights 01/16/2020 01/15/2020 01/15/2020  Weight (lbs) 136 lb 14.5 oz 135 lb 5.8 oz 141 lb 15.6 oz  Weight (kg) 62.1 kg 61.4 kg 64.4 kg    Body mass index is 22.1 kg/m.  General: Well nourished, well developed, in no acute distress Head: Atraumatic, normal size  Eyes: PEERLA, EOMI  Neck: Supple, no JVD Endocrine: No thryomegaly Cardiac: Normal S1, S2; 2 out of 6 systolic ejection murmur, mechanical clicks noted Lungs: Clear to auscultation bilaterally, no wheezing, rhonchi or rales  Abd: Soft, nontender, no hepatomegaly  Ext:  No edema, pulses 2+ Musculoskeletal: No deformities, BUE and BLE strength normal and equal Skin: Warm and dry, no rashes   Neuro: Alert and oriented to person, place, time, and situation, CNII-XII grossly intact, no focal deficits  Psych: Normal mood and affect   Labs  High Sensitivity Troponin:  No results for input(s): TROPONINIHS in the last 720 hours.   Cardiac EnzymesNo results for input(s): TROPONINI in the last 168 hours. No results for input(s): TROPIPOC in the last 168 hours.  Chemistry Recent Labs  Lab 01/14/20 0715 01/15/20 0532 01/16/20 0251  NA 132* 132* 133*  K 4.2 4.4 3.7  CL 93* 93* 90*  CO2 25 24 27   GLUCOSE 92 87 96  BUN 30* 46* 25*  CREATININE 4.98* 6.99* 4.59*  CALCIUM 9.6 9.7 10.2  ALBUMIN 2.6* 2.7* 2.8*  GFRNONAA 9* 6* 10*  GFRAA 10* 7* 11*  ANIONGAP 14 15 16*    Hematology Recent Labs  Lab 01/14/20 1744  01/15/20 0532 01/16/20 0251  WBC 8.5 7.8 8.2  RBC 2.88* 2.83* 3.23*  HGB 9.0* 8.8* 10.1*  HCT 27.8* 27.2* 31.6*  MCV 96.5 96.1 97.8  MCH 31.3 31.1 31.3  MCHC 32.4 32.4 32.0  RDW 15.6* 15.4 15.8*  PLT 257 257 323   BNPNo results for input(s): BNP, PROBNP in the last 168 hours.  DDimer No results for input(s): DDIMER in the last 168 hours.   Radiology  No results found.  Cardiac Studies  TTE 03/12/2019  1. Left ventricular ejection fraction, by visual estimation, is 65 to  70%. The left ventricle has normal function. Normal left ventricular size.  There is mildly increased left ventricular hypertrophy.  2. Global right ventricle has normal systolic function.The right  ventricular size is normal. No increase in right ventricular wall  thickness.  3. Left atrial size was mildly dilated.  4. Right atrial size was normal.  5. The mitral valve has been repaired/replaced. No evidence of mitral  valve regurgitation. No evidence of mitral stenosis.  6. [employer] Optiform bileaflet mechanical valve (size 31 mm, cat  # [id number], serial # [id number]).   Mean gradient 52mmHg.  7. The tricuspid valve is normal in structure. Tricuspid valve  regurgitation is trivial.  8. Aortic valve regurgitation was not visualized by color flow Doppler.  Structurally normal aortic valve, with no evidence of sclerosis or  stenosis.  9. Mechanical prosthesis in the aortic valve position.  10. [employer] Top-Hat bileaflet mechanical valve (size 21 mm, cat  # [id number], serial # [id number]).   Peak velocity: 2.73m/s, mean gradient 5mmHg.  11. The pulmonic valve was normal in structure. Pulmonic valve  regurgitation is not visualized by color flow Doppler.  12. Normal pulmonary artery systolic pressure.  13. The inferior vena cava is normal in size with greater than 50%  respiratory variability, suggesting right atrial pressure of 3 mmHg.   Patient Profile  [name] is  a 59 y.o. female with ESRD, mechanical AVR/MVR, lupus, hypertension who was admitted on 01/06/2020 for spontaneous retroperitoneal bleed.  Cardiology following along due to mechanical aortic and mitral valves.  Assessment & Plan   1.  Mechanical aortic valve/mitral valve/retroperitoneal bleed -Hemoglobin stable.  No further evidence of bleeding.  Back on IV heparin and Coumadin.  Heparin was on hold this morning due to high APTT level.  Repeat pending. -Would continue to get her back on Coumadin.  Her INR goal is 2.5-3.5 given mechanical MVR. -Tolerating hemodialysis well. -Plans to involve  IR should she bleed again.  Cardiology will follow remotely.  Please let us know if you need any assistance.  For questions or updates, please contact [place] Please consult [email or web address] for contact info under   Time Spent with Patient: I have spent a total of 15 minutes with patient reviewing hospital notes, telemetry, EKGs, labs and examining the patient as well as establishing an assessment and plan that was discussed with the patient.  > 50% of time was spent in direct patient care.    Signed, Addison Naegeli. Audie Box, [place]  01/16/2020 9:02 AM

## 2020-01-16 NOTE — Progress Notes (Signed)
Patient ID: Francenia Chimenti, female   DOB: 29-Dec-1960, 59 y.o.   MRN: [id number]  Chart rounds made.    Pt. Back on Heparin.  Hgb stable.

## 2020-01-16 NOTE — Progress Notes (Signed)
[facility] Progress Note  Subjective: Hb up 10.1 this am, 3L off on HD yest   Vitals:   01/16/20 0054 01/16/20 0359 01/16/20 0903 01/16/20 1208  BP: 123/73 (!) 142/98 122/71 (!) 150/83  Pulse: 87 98 (!) 106 90  Resp: 18 18 18 18   Temp: 97.8 F (36.6 C) 98.5 F (36.9 C) 98.8 F (37.1 C) 98.5 F (36.9 C)  TempSrc: Oral Oral Oral Oral  SpO2: 98% 100% 98% 98%  Weight:  62.1 kg    Height:        Exam: Gen: No acute distress, comfortable CVS: tachycardic, +murmur, loud S2 Resp:unlabored, bl chest expansion, clear to auscultation bilaterally ABd: abd firm, mild tenderness L side , no rebound Ext:no edema Neuro: awake, alert, speech clear and coherent Access: rue avf with +b/t  OP HD: MWF HD  4h  61kg  450/800  2/2 bath  P2  Hep 1800  -sensipar 180mg  -calcitriol 57mcg -no esa, no iron    Assessment/ Plan:   1. L peri-renal bleed/ hematoma - by CT 8/18. S/p ffp, prbc, and vit k on admit, INR normalized. IV heparin restarted 8/21 then stopped 8/22 and given more prbc's (4 prbc total here), IV hep restarted again 8/23 - seen by urology, if rebleeds would embolize the L kidney - Hb remains stable, up to 10 today, continues on IV hep and coumadin. INR 1.2 today.   2. Rectal bleeding - none further, normal BM x 2 yest 3. H/p AVR and MVR on chronic anticoagulation 4. ESRD - usual HD is MWF. HD Monday.  5. Secondary hyperparathyroidism. Limit phos in diet, sensipar and calcitriol (phos 5.4 today).  On Tums which is reasonable in the setting of indigestion and hyperphosphatemia 6. HTN/vol  - BP's stable on home metoprolol. 3L off yest , 1kg up today. BP's good.     [name], MD 01/14/2020, 11:41 AM        Subjective:      Objective:   BP 137/75 (BP Location: Left Arm)   Pulse 90   Temp 99 F (37.2 C) (Oral)   Resp 17   Ht 5\' 6"  (1.676 m)   Wt 63 kg   LMP 12/05/2010 (LMP Unknown)   SpO2 98%   BMI 22.42 kg/m   Intake/Output Summary (Last 24  hours) at 01/14/2020 1141 Last data filed at 01/14/2020 0939 Gross per 24 hour  Intake 1327.22 ml  Output 2501 ml  Net -1173.78 ml   Weight change: 0.1 kg  Physical Exam:     Rob Duha Abair 01/16/2020, 3:51 PM   Recent Labs  Lab 01/15/20 0532 01/16/20 0251  K 4.4 3.7  BUN 46* 25*  CREATININE 6.99* 4.59*  CALCIUM 9.7 10.2  PHOS 4.2 4.0  HGB 8.8* 10.1*   Inpatient medications: . calcitRIOL  2 mcg Oral Q M,W,F  . Chlorhexidine Gluconate Cloth  6 each Topical Daily  . docusate sodium  100 mg Oral Daily  . metoprolol tartrate  25 mg Oral BID  . pantoprazole (PROTONIX) IV  40 mg Intravenous Daily  . polyethylene glycol  17 g Oral BID  . senna  1 tablet Oral Daily  . simethicone  80 mg Oral TID  . warfarin  7.5 mg Oral ONCE-1600  . Warfarin - Pharmacist Dosing Inpatient   Does not apply q1600   . sodium chloride    . heparin 1,150 Units/hr (01/16/20 0910)   sodium chloride, calcium carbonate, ondansetron, oxyCODONE-acetaminophen

## 2020-01-17 LAB — CBC
HCT: 28.9 % — ABNORMAL LOW (ref 36.0–46.0)
Hemoglobin: 9.3 g/dL — ABNORMAL LOW (ref 12.0–15.0)
MCH: 31.1 pg (ref 26.0–34.0)
MCHC: 32.2 g/dL (ref 30.0–36.0)
MCV: 96.7 fL (ref 80.0–100.0)
Platelets: 295 10*3/uL (ref 150–400)
RBC: 2.99 MIL/uL — ABNORMAL LOW (ref 3.87–5.11)
RDW: 15.3 % (ref 11.5–15.5)
WBC: 8.4 10*3/uL (ref 4.0–10.5)
nRBC: 0 % (ref 0.0–0.2)

## 2020-01-17 LAB — RENAL FUNCTION PANEL
Albumin: 2.8 g/dL — ABNORMAL LOW (ref 3.5–5.0)
Anion gap: 14 (ref 5–15)
BUN: 44 mg/dL — ABNORMAL HIGH (ref 6–20)
CO2: 24 mmol/L (ref 22–32)
Calcium: 10 mg/dL (ref 8.9–10.3)
Chloride: 90 mmol/L — ABNORMAL LOW (ref 98–111)
Creatinine, Ser: 7.04 mg/dL — ABNORMAL HIGH (ref 0.44–1.00)
GFR calc Af Amer: 7 mL/min — ABNORMAL LOW (ref >60)
GFR calc non Af Amer: 6 mL/min — ABNORMAL LOW (ref >60)
Glucose, Bld: 86 mg/dL (ref 70–99)
Phosphorus: 4.9 mg/dL — ABNORMAL HIGH (ref 2.5–4.6)
Potassium: 3.8 mmol/L (ref 3.5–5.1)
Sodium: 128 mmol/L — ABNORMAL LOW (ref 135–145)

## 2020-01-17 LAB — PROTIME-INR
INR: 1.2 (ref 0.8–1.2)
Prothrombin Time: 14.5 seconds (ref 11.4–15.2)

## 2020-01-17 LAB — HEPARIN LEVEL (UNFRACTIONATED): Heparin Unfractionated: 0.42 IU/mL (ref 0.30–0.70)

## 2020-01-17 MED ORDER — WARFARIN SODIUM 10 MG PO TABS
10.0000 mg | ORAL_TABLET | Freq: Once | ORAL | Status: AC
  Administered 2020-01-17: 10 mg via ORAL
  Filled 2020-01-17: qty 1

## 2020-01-17 MED ORDER — CHLORHEXIDINE GLUCONATE CLOTH 2 % EX PADS
6.0000 | MEDICATED_PAD | Freq: Every day | CUTANEOUS | Status: DC
  Administered 2020-01-17 – 2020-01-26 (×7): 6 via TOPICAL

## 2020-01-17 MED ORDER — PANTOPRAZOLE SODIUM 40 MG PO TBEC
40.0000 mg | DELAYED_RELEASE_TABLET | Freq: Every day | ORAL | Status: DC
  Administered 2020-01-17 – 2020-02-12 (×25): 40 mg via ORAL
  Filled 2020-01-17 (×25): qty 1

## 2020-01-17 MED ORDER — CINACALCET HCL 30 MG PO TABS
180.0000 mg | ORAL_TABLET | ORAL | Status: DC
  Administered 2020-01-18 – 2020-02-05 (×7): 180 mg via ORAL
  Filled 2020-01-17 (×7): qty 6

## 2020-01-17 MED ORDER — RENA-VITE PO TABS
1.0000 | ORAL_TABLET | Freq: Every day | ORAL | Status: DC
  Administered 2020-01-17 – 2020-02-11 (×26): 1 via ORAL
  Filled 2020-01-17 (×26): qty 1

## 2020-01-17 MED ORDER — ACETAMINOPHEN 325 MG PO TABS
650.0000 mg | ORAL_TABLET | Freq: Four times a day (QID) | ORAL | Status: DC | PRN
  Administered 2020-01-17 – 2020-02-11 (×24): 650 mg via ORAL
  Filled 2020-01-17 (×24): qty 2

## 2020-01-17 MED ORDER — FERRIC CITRATE 1 GM 210 MG(FE) PO TABS
840.0000 mg | ORAL_TABLET | Freq: Three times a day (TID) | ORAL | Status: DC
  Administered 2020-01-17 – 2020-01-23 (×14): 840 mg via ORAL
  Filled 2020-01-17 (×17): qty 4

## 2020-01-17 NOTE — Progress Notes (Addendum)
[facility] PROGRESS NOTE    Progress Note  [name]  DGU:[id number] DOB: [birth year] DOA: 01/06/2020 PCP: Benito Mccreedy, MD     Brief Narrative:   [name] is an 59 y.o. female past medical history of end-stage renal disease on hemodialysis Monday Wednesday and Friday status post aortic and mitral valve replacement on Coumadin who presents with nausea and nonbloody emesis, she was found to have a spontaneous left retroperitoneal bleed she status post 1 unit of packed red blood cells 2 units of fresh frozen plasma and vitamin K,  Assessment/Plan:   Acute blood loss anemia secondary to spontaneous left retroperitoneal bleed while on Coumadin: Baseline hemoglobin around 8-9, low on admission she was transfused several units. Transfuse as needed if hemoglobin less than 7 or symptomatic. She is currently on IV heparin, starting oral Coumadin today goal INR 2.5-3.5 No further bloody bowel movements her hemoglobin has remained stable, continue IV heparin and Coumadin. INR continues to be subtherapeutic.  Aortic and mitral valve stenosis status post AVR and MVR: Continue heparin per pharmacy appreciate assistance. Start Coumadin per pharmacy.  End-stage renal disease on Monday Wednesdays and Fridays: Continue HD per nephrology.  Prolonged QTC: Continue to monitor limit QT prolonging drugs.  Constipation: Continue Senokot and MiraLAX, she relates her last bowel movement was about 7 days ago.   DVT prophylaxis: heparin & coumadin Family Communication:none Status is: Inpatient  Remains inpatient appropriate because:Hemodynamically unstable   Dispo: The patient is from: Home              Anticipated d/c is to: Home              Anticipated d/c date is: 3 days              Patient currently is not medically stable to d/c.  Once INR reaches a therapeutic level.  Goal 2.5-3.5.        Code Status:     Code Status Orders  (From admission, onward)          Start     Ordered   01/06/20 1945  Full code  Continuous        01/06/20 1946        Code Status History    Date Active Date Inactive Code Status Order ID Comments User Context   01/06/2019 1833 01/09/2019 2258 Full Code [id number]  Merton Border, MD Inpatient   12/02/2018 1402 12/11/2018 1548 Full Code [id number]  Rexene Alberts, MD Inpatient   07/24/2018 0951 07/24/2018 1652 Full Code [id number]  Larey Dresser, MD Inpatient   03/14/2018 2044 03/15/2018 2213 Full Code [id number]  Shela Leff, MD ED   10/21/2017 2044 10/25/2017 1848 Full Code [id number]  Vianne Bulls, MD ED   01/27/2017 0435 01/30/2017 1756 Full Code [id number]  Quintella Baton, MD ED   10/09/2016 1548 10/10/2016 2135 Full Code [id number]  Latanya [name], MD Inpatient   05/01/2015 2236 05/05/2015 1850 Full Code [id number]  Norval Morton, MD Inpatient   07/25/2014 2354 08/01/2014 1724 Full Code [id number]  Allie Bossier, MD ED   Advance Care Planning Activity        IV Access:    Peripheral IV   Procedures and diagnostic studies:   No results found.   Medical Consultants:    None.  Anti-Infectives:   none  Subjective:    [name] feels great no bloody bowel movements.  Objective:    Vitals:   01/16/20 1208  01/16/20 2033 01/17/20 0023 01/17/20 0600  BP: (!) 150/83 130/74 (!) 151/81 (!) 144/83  Pulse: 90 97 92 91  Resp: 18 18 18 18   Temp: 98.5 F (36.9 C) 98.6 F (37 C) 98.4 F (36.9 C) 98.6 F (37 C)  TempSrc: Oral Oral Oral Oral  SpO2: 98% 97% 98% 99%  Weight:   63 kg   Height:       SpO2: 99 % O2 Flow Rate (L/min): 2 L/min   Intake/Output Summary (Last 24 hours) at 01/17/2020 0735 Last data filed at 01/17/2020 0603 Gross per 24 hour  Intake 960 ml  Output --  Net 960 ml   Filed Weights   01/15/20 1145 01/16/20 0359 01/17/20 0023  Weight: 61.4 kg 62.1 kg 63 kg    Exam: General exam: In no acute distress. Respiratory system: Good air movement and clear to  auscultation. Cardiovascular system: S1 & S2 heard, RRR. No JVD. Gastrointestinal system: Abdomen is nondistended, soft and nontender.  Extremities: No pedal edema. Skin: No rashes, lesions or ulcers  Data Reviewed:    Labs: Basic Metabolic Panel: Recent Labs  Lab 01/13/20 0102 01/13/20 0102 01/14/20 0715 01/14/20 0715 01/15/20 0532 01/15/20 0532 01/16/20 0251 01/17/20 0417  NA 131*  --  132*  --  132*  --  133* 128*  K 3.8   < > 4.2   < > 4.4   < > 3.7 3.8  CL 92*  --  93*  --  93*  --  90* 90*  CO2 23  --  25  --  24  --  27 24  GLUCOSE 90  --  92  --  87  --  96 86  BUN 46*  --  30*  --  46*  --  25* 44*  CREATININE 7.42*  --  4.98*  --  6.99*  --  4.59* 7.04*  CALCIUM 9.1  --  9.6  --  9.7  --  10.2 10.0  PHOS 4.1  --  3.6  --  4.2  --  4.0 4.9*   < > = values in this interval not displayed.   GFR Estimated Creatinine Clearance: 8.3 mL/min (A) (by C-G formula based on SCr of 7.04 mg/dL (H)). Liver Function Tests: Recent Labs  Lab 01/13/20 0102 01/14/20 0715 01/15/20 0532 01/16/20 0251 01/17/20 0417  ALBUMIN 2.6* 2.6* 2.7* 2.8* 2.8*   No results for input(s): LIPASE, AMYLASE in the last 168 hours. No results for input(s): AMMONIA in the last 168 hours. Coagulation profile Recent Labs  Lab 01/11/20 0539 01/14/20 0834 01/15/20 0532 01/16/20 0251 01/17/20 0417  INR 1.1 1.1 1.1 1.2 1.2   COVID-19 Labs  No results for input(s): DDIMER, FERRITIN, LDH, CRP in the last 72 hours.  Lab Results  Component Value Date   SARSCOV2NAA NEGATIVE 01/06/2020   [place] NEGATIVE 01/06/2019   [place] NEGATIVE 11/28/2018    CBC: Recent Labs  Lab 01/14/20 0715 01/14/20 1744 01/15/20 0532 01/16/20 0251 01/17/20 0417  WBC 8.1 8.5 7.8 8.2 8.4  HGB 8.5* 9.0* 8.8* 10.1* 9.3*  HCT 26.8* 27.8* 27.2* 31.6* 28.9*  MCV 96.4 96.5 96.1 97.8 96.7  PLT 224 257 257 323 295   Cardiac Enzymes: No results for input(s): CKTOTAL, CKMB, CKMBINDEX, TROPONINI in the last  168 hours. BNP (last 3 results) No results for input(s): PROBNP in the last 8760 hours. CBG: No results for input(s): GLUCAP in the last 168 hours. D-Dimer: No results for input(s): DDIMER in  the last 72 hours. Hgb A1c: No results for input(s): HGBA1C in the last 72 hours. Lipid Profile: No results for input(s): CHOL, HDL, LDLCALC, TRIG, CHOLHDL, LDLDIRECT in the last 72 hours. Thyroid function studies: No results for input(s): TSH, T4TOTAL, T3FREE, THYROIDAB in the last 72 hours.  Invalid input(s): FREET3 Anemia work up: No results for input(s): VITAMINB12, FOLATE, FERRITIN, TIBC, IRON, RETICCTPCT in the last 72 hours. Sepsis Labs: Recent Labs  Lab 01/14/20 1744 01/15/20 0532 01/16/20 0251 01/17/20 0417  WBC 8.5 7.8 8.2 8.4   Microbiology No results found for this or any previous visit (from the past 240 hour(s)).   Medications:   . calcitRIOL  2 mcg Oral Q M,W,F  . Chlorhexidine Gluconate Cloth  6 each Topical Daily  . docusate sodium  100 mg Oral Daily  . metoprolol tartrate  25 mg Oral BID  . pantoprazole (PROTONIX) IV  40 mg Intravenous Daily  . polyethylene glycol  17 g Oral BID  . senna  1 tablet Oral Daily  . simethicone  80 mg Oral TID  . warfarin  10 mg Oral ONCE-1600  . Warfarin - Pharmacist Dosing Inpatient   Does not apply q1600   Continuous Infusions: . sodium chloride    . heparin 1,150 Units/hr (01/16/20 0910)      LOS: 11 days   Charlynne Cousins  [facility]  01/17/2020, 7:35 AM

## 2020-01-17 NOTE — Progress Notes (Addendum)
ANTICOAGULATION CONSULT NOTE - Follow Up Consult  Pharmacy Consult for Heparin + Coumadin Indication: artificial heart valves  No Known Allergies  Patient Measurements: Height: 5\' 6"  (167.6 cm) Weight: 63 kg (138 lb 14.2 oz) (scale a) IBW/kg (Calculated) : 59.3    Vital Signs: Temp: 98.6 F (37 C) (08/29 0600) Temp Source: Oral (08/29 0600) BP: 144/83 (08/29 0600) Pulse Rate: 91 (08/29 0600)  Labs: Recent Labs    01/15/20 0532 01/15/20 0532 01/16/20 0251 01/16/20 0251 01/16/20 0840 01/16/20 1341 01/17/20 0417  HGB 8.8*   < > 10.1*  --   --   --  9.3*  HCT 27.2*  --  31.6*  --   --   --  28.9*  PLT 257  --  323  --   --   --  295  LABPROT 13.4  --  14.6  --   --   --  14.5  INR 1.1  --  1.2  --   --   --  1.2  HEPARINUNFRC 0.33   < > 2.02*   < > 0.17* 0.33 0.42  CREATININE 6.99*  --  4.59*  --   --   --  7.04*   < > = values in this interval not displayed.    Estimated Creatinine Clearance: 8.3 mL/min (A) (by C-G formula based on SCr of 7.04 mg/dL (H)).    HPI: s/p RP hematoma. Warfarin PTA for hx mechanical AVR/MVR (goal 2.5-3.5) reversed vit K 10mg  IV + 2 units FFP on 8/18.   Heparin resumed on 8/23 and resumed warfarin 8/25. Noted with [facility] on 8/26 and no further blood has been noted.  PTA Warfarin: 3 mg daily except 6 mg on Mondays- last 8/18  Assessment: Heparin level therapeutic at 0.42. INR subtherapeutic at 1.2. Hgb, Hct stable and Plt within normal limits. No signs of bleeding or issues with the heparin infusion noted.   Goal of Therapy:  Heparin level 0.3-0.5 units/ml  INR goal 2.5- 3.5 Monitor platelets by anticoagulation protocol: Yes   Plan:  - Continue Heparin 1150 U/hr - Coumadin 10mg  po x 1 today - Daily HL, CBC, INR  Norina Buzzard, PharmD PGY1 Pharmacy Resident 01/17/2020 7:01 AM  **Pharmacist phone directory can now be found on [email or web address] ([email or web address]).  Listed under [place].

## 2020-01-17 NOTE — Progress Notes (Addendum)
[facility] Progress Note   Subjective:   Patient seen and examined at bedside.  Reports vomiting last night and ongoing nausea this AM.  Believes it is due to combination of medications for constipation.  BM yesterday, no melena or hematochezia noted.  Denies CP, diarrhea, SOB, weakness, dizziness and fatigue.  Continues to have mild back pain but overall vast improvement.  Hopeful that her INR will improve to goal soon so she can go home.   Objective Vitals:   01/16/20 1208 01/16/20 2033 01/17/20 0023 01/17/20 0600  BP: (!) 150/83 130/74 (!) 151/81 (!) 144/83  Pulse: 90 97 92 91  Resp: 18 18 18 18   Temp: 98.5 F (36.9 C) 98.6 F (37 C) 98.4 F (36.9 C) 98.6 F (37 C)  TempSrc: Oral Oral Oral Oral  SpO2: 98% 97% 98% 99%  Weight:   63 kg   Height:       Physical Exam General:WDWN, thin female in NAD Heart:RRR +6/1 systolic murmur, +mechanical heart sounds Lungs:CTAB, faint crackles in bases Abdomen:soft, NTND Extremities:no LE edema Dialysis Access: RU AVF +b   Filed Weights   01/15/20 1145 01/16/20 0359 01/17/20 0023  Weight: 61.4 kg 62.1 kg 63 kg    Intake/Output Summary (Last 24 hours) at 01/17/2020 0956 Last data filed at 01/17/2020 0843 Gross per 24 hour  Intake 780 ml  Output --  Net 780 ml    Additional Objective Labs: Basic Metabolic Panel: Recent Labs  Lab 01/15/20 0532 01/16/20 0251 01/17/20 0417  NA 132* 133* 128*  K 4.4 3.7 3.8  CL 93* 90* 90*  CO2 24 27 24   GLUCOSE 87 96 86  BUN 46* 25* 44*  CREATININE 6.99* 4.59* 7.04*  CALCIUM 9.7 10.2 10.0  PHOS 4.2 4.0 4.9*   Liver Function Tests: Recent Labs  Lab 01/15/20 0532 01/16/20 0251 01/17/20 0417  ALBUMIN 2.7* 2.8* 2.8*   CBC: Recent Labs  Lab 01/14/20 0715 01/14/20 0715 01/14/20 1744 01/14/20 1744 01/15/20 0532 01/16/20 0251 01/17/20 0417  WBC 8.1   < > 8.5   < > 7.8 8.2 8.4  HGB 8.5*   < > 9.0*   < > 8.8* 10.1* 9.3*  HCT 26.8*   < > 27.8*   < > 27.2* 31.6* 28.9*   MCV 96.4  --  96.5  --  96.1 97.8 96.7  PLT 224   < > 257   < > 257 323 295   < > = values in this interval not displayed.   Blood Culture    Component Value Date/Time   SDES FLUID RIGHT PLEURAL 01/07/2019 1513   SDES FLUID RIGHT PLEURAL 01/07/2019 1513   [place] 01/07/2019 1513   SPECREQUEST BOTTLES DRAWN AEROBIC AND ANAEROBIC 10CC 01/07/2019 1513   CULT  01/07/2019 1513    NO GROWTH 5 DAYS Performed at [facility], [place] [address] [place], [place] [address]    REPTSTATUS 01/07/2019 FINAL 01/07/2019 1513   REPTSTATUS 01/12/2019 FINAL 01/07/2019 1513   Lab Results  Component Value Date   INR 1.2 01/17/2020   INR 1.2 01/16/2020   INR 1.1 01/15/2020   Studies/Results: No results found.  Medications: . sodium chloride    . heparin 1,150 Units/hr (01/16/20 0910)   . calcitRIOL  2 mcg Oral Q M,W,F  . Chlorhexidine Gluconate Cloth  6 each Topical Daily  . docusate sodium  100 mg Oral Daily  . metoprolol tartrate  25 mg Oral BID  . multivitamin  1 tablet Oral QHS  . pantoprazole  40 mg Oral QAC breakfast  . polyethylene glycol  17 g Oral BID  . senna  1 tablet Oral Daily  . simethicone  80 mg Oral TID  . warfarin  10 mg Oral ONCE-1600  . Warfarin - Pharmacist Dosing Inpatient   Does not apply [id number]    Dialysis Orders: MWF HD  4h  61kg  450/800  2/2 bath  P2  Hep 1800  -sensipar 180mg  -calcitriol 69mcg -no esa, no iron  Assessment/Plan: 1. Retroperitoneal bleed/L perirenal bleed/hematoma -  Seen on CT 8/18.  S/p 2 units FFP, 4 units pRBC total and Vit K.  IV heparin started 8/21 and stopped 8/22, given more pRBC.  IV hep and coumadin restarted 8/23.  INR remains subtherapeutic, 1.2 today.  Seen by Urology, if rebleeds plan to embolize L kidney.  Hgb up and down, 9.3 today.  2. Rectal bleeding - resolved.  Nml BM yesterday.  3. Hx AVR/MVR - on chronic anticoagulation.  Cardiology following.  4. ESRD - on HD MWF.  Orders written for HD tomorrow pre  regular schedule.  K 3.8 5. Anemia of CKD- Hgb 9.3 today.  S/p 4 units pRBC.  Order iron studies.   6. Secondary hyperparathyroidism - Phos at goal. CCa 10.9. use low Ca bath. On Auryxia as OP, will change binders. Continue VDRA and sensipar. 7. HTN/volume - BP mildly elevated this AM.  Continue metoprolol.  Does not appear grossly volume overloaded.  Plan for UF to dry tomorrow.  8. Nutrition - Renal diet w/fluid restrictions. Alb 2.8. Protein supplements.   Jen Mow, PA-C [facility] 01/17/2020,9:56 AM  LOS: 11 days

## 2020-01-18 LAB — CBC
HCT: 29.3 % — ABNORMAL LOW (ref 36.0–46.0)
Hemoglobin: 9.7 g/dL — ABNORMAL LOW (ref 12.0–15.0)
MCH: 31.9 pg (ref 26.0–34.0)
MCHC: 33.1 g/dL (ref 30.0–36.0)
MCV: 96.4 fL (ref 80.0–100.0)
Platelets: 315 10*3/uL (ref 150–400)
RBC: 3.04 MIL/uL — ABNORMAL LOW (ref 3.87–5.11)
RDW: 15.1 % (ref 11.5–15.5)
WBC: 8.8 10*3/uL (ref 4.0–10.5)
nRBC: 0 % (ref 0.0–0.2)

## 2020-01-18 LAB — HEPARIN LEVEL (UNFRACTIONATED)
Heparin Unfractionated: 0.68 IU/mL (ref 0.30–0.70)
Heparin Unfractionated: 0.37 IU/mL (ref 0.30–0.70)

## 2020-01-18 LAB — IRON AND TIBC
Iron: 145 ug/dL (ref 28–170)
Saturation Ratios: 84 % — ABNORMAL HIGH (ref 10.4–31.8)
TIBC: 172 ug/dL — ABNORMAL LOW (ref 250–450)
UIBC: 27 ug/dL

## 2020-01-18 LAB — PROTIME-INR
INR: 1.4 — ABNORMAL HIGH (ref 0.8–1.2)
Prothrombin Time: 16.3 seconds — ABNORMAL HIGH (ref 11.4–15.2)

## 2020-01-18 LAB — RENAL FUNCTION PANEL
Albumin: 2.8 g/dL — ABNORMAL LOW (ref 3.5–5.0)
Anion gap: 18 — ABNORMAL HIGH (ref 5–15)
BUN: 61 mg/dL — ABNORMAL HIGH (ref 6–20)
CO2: 23 mmol/L (ref 22–32)
Calcium: 10 mg/dL (ref 8.9–10.3)
Chloride: 89 mmol/L — ABNORMAL LOW (ref 98–111)
Creatinine, Ser: 9.86 mg/dL — ABNORMAL HIGH (ref 0.44–1.00)
GFR calc Af Amer: 5 mL/min — ABNORMAL LOW (ref >60)
GFR calc non Af Amer: 4 mL/min — ABNORMAL LOW (ref >60)
Glucose, Bld: 83 mg/dL (ref 70–99)
Phosphorus: 4.9 mg/dL — ABNORMAL HIGH (ref 2.5–4.6)
Potassium: 4.2 mmol/L (ref 3.5–5.1)
Sodium: 130 mmol/L — ABNORMAL LOW (ref 135–145)

## 2020-01-18 LAB — FERRITIN: Ferritin: 418 ng/mL — ABNORMAL HIGH (ref 11–307)

## 2020-01-18 MED ORDER — SODIUM CHLORIDE 0.9 % IV SOLN: 100.0000 mL | INTRAVENOUS | Status: DC | PRN

## 2020-01-18 MED ORDER — LIDOCAINE-PRILOCAINE 2.5-2.5 % EX CREA: 1.0000 "application " | TOPICAL_CREAM | CUTANEOUS | Status: DC | PRN

## 2020-01-18 MED ORDER — PENTAFLUOROPROP-TETRAFLUOROETH EX AERO: 1.0000 "application " | INHALATION_SPRAY | CUTANEOUS | Status: DC | PRN

## 2020-01-18 MED ORDER — ALTEPLASE 2 MG IJ SOLR: 2.0000 mg | Freq: Once | INTRAMUSCULAR | Status: DC | PRN

## 2020-01-18 MED ORDER — WARFARIN SODIUM 3 MG PO TABS
9.0000 mg | ORAL_TABLET | Freq: Once | ORAL | Status: AC
  Administered 2020-01-18: 9 mg via ORAL
  Filled 2020-01-18: qty 1
  Filled 2020-01-18: qty 3

## 2020-01-18 MED ORDER — ACETAMINOPHEN 325 MG PO TABS
ORAL_TABLET | ORAL | Status: AC
  Filled 2020-01-18: qty 2

## 2020-01-18 MED ORDER — LIDOCAINE HCL (PF) 1 % IJ SOLN: 5.0000 mL | INTRAMUSCULAR | Status: DC | PRN

## 2020-01-18 MED ORDER — HEPARIN SODIUM (PORCINE) 1000 UNIT/ML DIALYSIS: 1000.0000 [IU] | INTRAMUSCULAR | Status: DC | PRN

## 2020-01-18 NOTE — Plan of Care (Signed)
  Problem: Clinical Measurements: Goal: Ability to maintain clinical measurements within normal limits will improve Outcome: Progressing Goal: Will remain free from infection Outcome: Progressing Goal: Diagnostic test results will improve Outcome: Progressing Goal: Respiratory complications will improve Outcome: Progressing   Problem: Activity: Goal: Risk for activity intolerance will decrease Outcome: Progressing   Problem: Nutrition: Goal: Adequate nutrition will be maintained Outcome: Progressing   Problem: Coping: Goal: Level of anxiety will decrease Outcome: Progressing   Problem: Elimination: Goal: Will not experience complications related to bowel motility Outcome: Progressing   Problem: Pain Managment: Goal: General experience of comfort will improve Outcome: Progressing   Problem: Safety: Goal: Ability to remain free from injury will improve Outcome: Progressing   Problem: Skin Integrity: Goal: Risk for impaired skin integrity will decrease Outcome: Progressing   Problem: Activity: Goal: Capacity to carry out activities will improve Outcome: Progressing   Problem: Activity: Goal: Capacity to carry out activities will improve Outcome: Progressing   Problem: Cardiac: Goal: Ability to achieve and maintain adequate cardiopulmonary perfusion will improve Outcome: Progressing

## 2020-01-18 NOTE — Progress Notes (Signed)
[facility] PROGRESS NOTE    Progress Note  [name]  HFW:[id number] DOB: 10/17/1960 DOA: 01/06/2020 PCP: Benito Mccreedy, MD     Brief Narrative:   [name] is an 59 y.o. female past medical history of end-stage renal disease on hemodialysis Monday Wednesday and Friday status post aortic and mitral valve replacement on Coumadin who presents with nausea and nonbloody emesis, she was found to have a spontaneous left retroperitoneal bleed she status post 1 unit of packed red blood cells 2 units of fresh frozen plasma and vitamin K,  Assessment/Plan:   Acute blood loss anemia secondary to spontaneous left retroperitoneal bleed while on Coumadin: Baseline hemoglobin around 8-9, low on admission she was transfused several units. Transfuse as needed if hemoglobin less than 7 or symptomatic. She is currently on IV heparin, starting oral Coumadin today goal INR 2.5-3.5 No further bloody bowel movements her hemoglobin has remained stable, continue IV heparin and Coumadin. INR continues to be subtherapeutic.it is trending up today 1.4.  Aortic and mitral valve stenosis status post AVR and MVR: Continue heparin per pharmacy appreciate assistance. Start Coumadin per pharmacy.  End-stage renal disease on Monday Wednesdays and Fridays: Continue HD per nephrology.  Prolonged QTC: Continue to monitor limit QT prolonging drugs.  Constipation: Continue Senokot and MiraLAX, she relates her last bowel movement was about 7 days ago.   DVT prophylaxis: heparin & coumadin Family Communication:none Status is: Inpatient  Remains inpatient appropriate because:Hemodynamically unstable   Dispo: The patient is from: Home              Anticipated d/c is to: Home              Anticipated d/c date is: 3 days              Patient currently is not medically stable to d/c.  Once INR reaches a therapeutic level.  Goal 2.5-3.5.        Code Status:     Code Status  Orders  (From admission, onward)         Start     Ordered   01/06/20 1945  Full code  Continuous        01/06/20 1946        Code Status History    Date Active Date Inactive Code Status Order ID Comments User Context   01/06/2019 1833 01/09/2019 2258 Full Code [id number]  Merton Border, MD Inpatient   12/02/2018 1402 12/11/2018 1548 Full Code [id number]  Rexene Alberts, MD Inpatient   07/24/2018 0951 07/24/2018 1652 Full Code [id number]  Larey Dresser, MD Inpatient   03/14/2018 2044 03/15/2018 2213 Full Code [id number]  Shela Leff, MD ED   10/21/2017 2044 10/25/2017 1848 Full Code [id number]  Vianne Bulls, MD ED   01/27/2017 0435 01/30/2017 1756 Full Code [id number]  Quintella Baton, MD ED   10/09/2016 1548 10/10/2016 2135 Full Code [id number]  Latanya [name], MD Inpatient   05/01/2015 2236 05/05/2015 1850 Full Code [id number]  Norval Morton, MD Inpatient   07/25/2014 2354 08/01/2014 1724 Full Code [id number]  Allie Bossier, MD ED   Advance Care Planning Activity        IV Access:    Peripheral IV   Procedures and diagnostic studies:   No results found.   Medical Consultants:    None.  Anti-Infectives:   none  Subjective:    Josilyn Shippee feels great no bloody bowel movements.  Objective:  Vitals:   01/18/20 0743 01/18/20 0745 01/18/20 0800 01/18/20 0830  BP: (!) 151/85 (!) 149/87 (!) 152/84 138/78  Pulse: 84 86 90 90  Resp:      Temp:      TempSrc:      SpO2:      Weight:      Height:       SpO2: 100 % O2 Flow Rate (L/min): 2 L/min   Intake/Output Summary (Last 24 hours) at 01/18/2020 0940 Last data filed at 01/18/2020 0600 Gross per 24 hour  Intake 1043.62 ml  Output 0 ml  Net 1043.62 ml   Filed Weights   01/17/20 0023 01/18/20 0454 01/18/20 0733  Weight: 63 kg 64 kg 61.5 kg    Exam: General exam: In no acute distress. Respiratory system: Good air movement and clear to auscultation. Cardiovascular system: S1 & S2 heard, RRR. No  JVD. Gastrointestinal system: Abdomen is nondistended, soft and nontender.  Extremities: No pedal edema. Skin: No rashes, lesions or ulcers  Data Reviewed:    Labs: Basic Metabolic Panel: Recent Labs  Lab 01/14/20 0715 01/14/20 0715 01/15/20 0532 01/15/20 0532 01/16/20 0251 01/16/20 0251 01/17/20 0417 01/18/20 0801  NA 132*  --  132*  --  133*  --  128* 130*  K 4.2   < > 4.4   < > 3.7   < > 3.8 4.2  CL 93*  --  93*  --  90*  --  90* 89*  CO2 25  --  24  --  27  --  24 23  GLUCOSE 92  --  87  --  96  --  86 83  BUN 30*  --  46*  --  25*  --  44* 61*  CREATININE 4.98*  --  6.99*  --  4.59*  --  7.04* 9.86*  CALCIUM 9.6  --  9.7  --  10.2  --  10.0 10.0  PHOS 3.6  --  4.2  --  4.0  --  4.9* 4.9*   < > = values in this interval not displayed.   GFR Estimated Creatinine Clearance: 5.9 mL/min (A) (by C-G formula based on SCr of 9.86 mg/dL (H)). Liver Function Tests: Recent Labs  Lab 01/14/20 0715 01/15/20 0532 01/16/20 0251 01/17/20 0417 01/18/20 0801  ALBUMIN 2.6* 2.7* 2.8* 2.8* 2.8*   No results for input(s): LIPASE, AMYLASE in the last 168 hours. No results for input(s): AMMONIA in the last 168 hours. Coagulation profile Recent Labs  Lab 01/14/20 0834 01/15/20 0532 01/16/20 0251 01/17/20 0417 01/18/20 0810  INR 1.1 1.1 1.2 1.2 1.4*   COVID-19 Labs  Recent Labs    01/18/20 0804  FERRITIN 418*    Lab Results  Component Value Date   SARSCOV2NAA NEGATIVE 01/06/2020   [place] NEGATIVE 01/06/2019   [place] NEGATIVE 11/28/2018    CBC: Recent Labs  Lab 01/14/20 1744 01/15/20 0532 01/16/20 0251 01/17/20 0417 01/18/20 0804  WBC 8.5 7.8 8.2 8.4 8.8  HGB 9.0* 8.8* 10.1* 9.3* 9.7*  HCT 27.8* 27.2* 31.6* 28.9* 29.3*  MCV 96.5 96.1 97.8 96.7 96.4  PLT 257 257 323 295 315   Cardiac Enzymes: No results for input(s): CKTOTAL, CKMB, CKMBINDEX, TROPONINI in the last 168 hours. BNP (last 3 results) No results for input(s): PROBNP in the last 8760  hours. CBG: No results for input(s): GLUCAP in the last 168 hours. D-Dimer: No results for input(s): DDIMER in the last 72 hours. Hgb A1c: No results  for input(s): HGBA1C in the last 72 hours. Lipid Profile: No results for input(s): CHOL, HDL, LDLCALC, TRIG, CHOLHDL, LDLDIRECT in the last 72 hours. Thyroid function studies: No results for input(s): TSH, T4TOTAL, T3FREE, THYROIDAB in the last 72 hours.  Invalid input(s): FREET3 Anemia work up: Recent Labs    01/18/20 0804  FERRITIN 418*  TIBC 172*  IRON 145   Sepsis Labs: Recent Labs  Lab 01/15/20 0532 01/16/20 0251 01/17/20 0417 01/18/20 0804  WBC 7.8 8.2 8.4 8.8   Microbiology No results found for this or any previous visit (from the past 240 hour(s)).   Medications:   . acetaminophen      . calcitRIOL  2 mcg Oral Q M,W,F  . Chlorhexidine Gluconate Cloth  6 each Topical Q0600  . cinacalcet  180 mg Oral Q M,W,F-HD  . docusate sodium  100 mg Oral Daily  . ferric citrate  840 mg Oral TID WC  . metoprolol tartrate  25 mg Oral BID  . multivitamin  1 tablet Oral QHS  . pantoprazole  40 mg Oral QAC breakfast  . polyethylene glycol  17 g Oral BID  . senna  1 tablet Oral Daily  . simethicone  80 mg Oral TID  . Warfarin - Pharmacist Dosing Inpatient   Does not apply q1600   Continuous Infusions: . sodium chloride    . sodium chloride    . sodium chloride    . heparin 1,150 Units/hr (01/17/20 2036)      LOS: 12 days   Charlynne Cousins  [facility]  01/18/2020, 9:40 AM

## 2020-01-18 NOTE — Progress Notes (Signed)
Patient stated that she had a few gtts of blood that she believes that it came from urine. Pharmacy was called due to Heparin gtt hat was place in bath room and patient instructed to inform if more bleeding. Will continue to monitor.Arthor Captain LPN

## 2020-01-18 NOTE — Procedures (Signed)
I was present at this dialysis session. I have reviewed the session itself and made appropriate changes.   Hb stable. 3K bath 2L UF goal.  Using AVF. Pt w/o complaint.   Filed Weights   01/16/20 0359 01/17/20 0023 01/18/20 0454  Weight: 62.1 kg 63 kg 64 kg    Recent Labs  Lab 01/18/20 0801  NA 130*  K 4.2  CL 89*  CO2 23  GLUCOSE 83  BUN 61*  CREATININE 9.86*  CALCIUM 10.0  PHOS 4.9*    Recent Labs  Lab 01/16/20 0251 01/17/20 0417 01/18/20 0804  WBC 8.2 8.4 8.8  HGB 10.1* 9.3* 9.7*  HCT 31.6* 28.9* 29.3*  MCV 97.8 96.7 96.4  PLT 323 295 315    Scheduled Meds: . acetaminophen      . calcitRIOL  2 mcg Oral Q M,W,F  . Chlorhexidine Gluconate Cloth  6 each Topical Q0600  . cinacalcet  180 mg Oral Q M,W,F-HD  . docusate sodium  100 mg Oral Daily  . ferric citrate  840 mg Oral TID WC  . metoprolol tartrate  25 mg Oral BID  . multivitamin  1 tablet Oral QHS  . pantoprazole  40 mg Oral QAC breakfast  . polyethylene glycol  17 g Oral BID  . senna  1 tablet Oral Daily  . simethicone  80 mg Oral TID  . Warfarin - Pharmacist Dosing Inpatient   Does not apply q1600   Continuous Infusions: . sodium chloride    . sodium chloride    . sodium chloride    . heparin 1,150 Units/hr (01/17/20 2036)   PRN Meds:.sodium chloride, sodium chloride, sodium chloride, acetaminophen, alteplase, heparin, lidocaine (PF), lidocaine-prilocaine, ondansetron, oxyCODONE-acetaminophen, pentafluoroprop-tetrafluoroeth   Pearson Grippe  MD 01/18/2020, 8:54 AM

## 2020-01-18 NOTE — Progress Notes (Signed)
   01/18/20 1145  Vital Signs  Temp 98.6 F (37 C)  Temp Source Oral  Pulse Rate 93  Pulse Rate Source Monitor  Resp 14  BP (!) 145/74  BP Location Left Arm  BP Method Automatic  Patient Position (if appropriate) Lying  Oxygen Therapy  SpO2 100 %  O2 Device Room Air  Dialysis Weight  Weight 62.2 kg (standing)  Type of Weight Post-Dialysis  Post-Hemodialysis Assessment  Rinseback Volume (mL) 250 mL  KECN 286 V  Dialyzer Clearance Lightly streaked  Duration of HD Treatment -hour(s) 4 hour(s)  Hemodialysis Intake (mL) 500 mL  UF Total -Machine (mL) 2482 mL  Net UF (mL) 1982 mL  Tolerated HD Treatment Yes  Post-Hemodialysis Comments tx complete-pt stable  AVG/AVF Arterial Site Held (minutes) 10 minutes  AVG/AVF Venous Site Held (minutes) 10 minutes  Fistula / Graft Right Upper arm Arteriovenous fistula  No Placement Date or Time found.   Orientation: Right  Access Location: Upper arm  Access Type: (c) Arteriovenous fistula  Site Condition No complications  Fistula / Graft Assessment Present;Thrill;Bruit  Status Deaccessed;Flushed

## 2020-01-18 NOTE — Progress Notes (Addendum)
ANTICOAGULATION CONSULT NOTE - Follow Up Consult  Pharmacy Consult for Heparin + Coumadin Indication: artificial heart valves  No Known Allergies  Patient Measurements: Height: 5\' 6"  (167.6 cm) Weight: 61.5 kg (135 lb 9.3 oz) (standing) IBW/kg (Calculated) : 59.3   Heparin dosing weight 61.5 kg   Vital Signs: Temp: 98.7 F (37.1 C) (08/30 0733) Temp Source: Oral (08/30 0733) BP: 138/78 (08/30 0830) Pulse Rate: 90 (08/30 0830)  Labs: Recent Labs    01/16/20 0251 01/16/20 0251 01/16/20 0840 01/16/20 1341 01/17/20 0417 01/18/20 0500 01/18/20 0801 01/18/20 0804 01/18/20 0810  HGB 10.1*   < >  --   --  9.3*  --   --  9.7*  --   HCT 31.6*  --   --   --  28.9*  --   --  29.3*  --   PLT 323  --   --   --  295  --   --  315  --   LABPROT 14.6  --   --   --  14.5  --   --   --  16.3*  INR 1.2  --   --   --  1.2  --   --   --  1.4*  HEPARINUNFRC 2.02*  --    < > 0.33 0.42 0.68  --   --   --   CREATININE 4.59*  --   --   --  7.04*  --  9.86*  --   --    < > = values in this interval not displayed.    Estimated Creatinine Clearance: 5.9 mL/min (A) (by C-G formula based on SCr of 9.86 mg/dL (H)).    HPI: s/p RP hematoma. Warfarin PTA for hx mechanical AVR/MVR (goal 2.5-3.5) reversed vit K 10mg  IV + 2 units FFP on 8/18.   Heparin resumed on 8/23 and resumed warfarin 8/25. Noted with BRBPR on 8/26 and no further blood has been noted.  PTA Warfarin: 9 mg daily except 6 mg on Mondays- last 8/18  (INR was 3.6 on admit 8/18)  Assessment: Heparin level supratherapeutic at 0.68 on heparin rate 1150 units/hr.  INR subtherapeutic at 1.4 although trending upward toward goal 2.5-3.5. Hgb, Hct stable and Plt within normal limits. No signs of bleeding noted.   Goal of Therapy:  Heparin level 0.3-0.5 units/ml  INR goal 2.5- 3.5 Monitor platelets by anticoagulation protocol: Yes   Plan:  - Decrease Heparin to 1000 units/hr - Check 6 hour heparin level - Coumadin 9 mg po x 1 today   Daily HL, CBC, INR  Nicole Cella, RPh Clinical Pharmacist 01/18/2020 10:02 AM  **Pharmacist phone directory can now be found on [email or web address] ([email or web address]).  Listed under [place].

## 2020-01-18 NOTE — Progress Notes (Signed)
ANTICOAGULATION CONSULT NOTE - Follow Up Consult  Pharmacy Consult for Heparin + Coumadin Indication: artificial heart valves  No Known Allergies  Patient Measurements: Height: 5\' 6"  (167.6 cm) Weight: 62.2 kg (137 lb 2 oz) (standing) IBW/kg (Calculated) : 59.3   Heparin dosing weight 61.5 kg   Vital Signs: Temp: 98.3 F (36.8 C) (08/30 1301) Temp Source: Oral (08/30 1301) BP: 131/78 (08/30 1924) Pulse Rate: 85 (08/30 1924)  Labs: Recent Labs    01/16/20 0251 01/16/20 0840 01/17/20 0417 01/18/20 0500 01/18/20 0801 01/18/20 0804 01/18/20 0810 01/18/20 1848  HGB 10.1*  --  9.3*  --   --  9.7*  --   --   HCT 31.6*  --  28.9*  --   --  29.3*  --   --   PLT 323  --  295  --   --  315  --   --   LABPROT 14.6  --  14.5  --   --   --  16.3*  --   INR 1.2  --  1.2  --   --   --  1.4*  --   HEPARINUNFRC 2.02*   < > 0.42 0.68  --   --   --  0.37  CREATININE 4.59*  --  7.04*  --  9.86*  --   --   --    < > = values in this interval not displayed.    Estimated Creatinine Clearance: 5.9 mL/min (A) (by C-G formula based on SCr of 9.86 mg/dL (H)).    HPI: s/p RP hematoma. Warfarin PTA for hx mechanical AVR/MVR (goal 2.5-3.5) reversed vit K 10mg  IV + 2 units FFP on 8/18.   Heparin resumed on 8/23 and resumed warfarin 8/25. Noted with BRBPR on 8/26 and no further blood has been noted.  PTA Warfarin: 9 mg daily except 6 mg on Mondays- last 8/18  (INR was 3.6 on admit 8/18)  Assessment: Heparin level therapeutic at 0.37 on heparin rate 1000 units/hr.  INR subtherapeutic at 1.4 although trending upward toward goal 2.5-3.5. Hgb, Hct stable and Plt within normal limits. No signs of bleeding noted.   Goal of Therapy:  Heparin level 0.3-0.5 units/ml  INR goal 2.5- 3.5 Monitor platelets by anticoagulation protocol: Yes   Plan:  - Continue heparin drip at 1000 units/hr - Coumadin 9 mg po x 1 as previously ordered - Daily HL, CBC, INR - Monitor for s/sx of bleeding  Thank you for  involving pharmacy in this patient's care.  Renold Genta, PharmD, BCPS Clinical Pharmacist Clinical phone for 01/18/2020 until 10p is [phone] 01/18/2020 7:50 PM  **Pharmacist phone directory can be found on [email or web address] listed under [place]**

## 2020-01-19 LAB — RENAL FUNCTION PANEL
Albumin: 2.8 g/dL — ABNORMAL LOW (ref 3.5–5.0)
Anion gap: 13 (ref 5–15)
BUN: 22 mg/dL — ABNORMAL HIGH (ref 6–20)
CO2: 26 mmol/L (ref 22–32)
Calcium: 8.8 mg/dL — ABNORMAL LOW (ref 8.9–10.3)
Chloride: 94 mmol/L — ABNORMAL LOW (ref 98–111)
Creatinine, Ser: 5.23 mg/dL — ABNORMAL HIGH (ref 0.44–1.00)
GFR calc Af Amer: 10 mL/min — ABNORMAL LOW (ref >60)
GFR calc non Af Amer: 8 mL/min — ABNORMAL LOW (ref >60)
Glucose, Bld: 81 mg/dL (ref 70–99)
Phosphorus: 3.5 mg/dL (ref 2.5–4.6)
Potassium: 3.9 mmol/L (ref 3.5–5.1)
Sodium: 133 mmol/L — ABNORMAL LOW (ref 135–145)

## 2020-01-19 LAB — HEPARIN LEVEL (UNFRACTIONATED): Heparin Unfractionated: 0.39 IU/mL (ref 0.30–0.70)

## 2020-01-19 LAB — CBC WITH DIFFERENTIAL/PLATELET
Abs Immature Granulocytes: 0.21 10*3/uL — ABNORMAL HIGH (ref 0.00–0.07)
Basophils Absolute: 0.1 10*3/uL (ref 0.0–0.1)
Basophils Relative: 1 %
Eosinophils Absolute: 0 10*3/uL (ref 0.0–0.5)
Eosinophils Relative: 1 %
HCT: 31.1 % — ABNORMAL LOW (ref 36.0–46.0)
Hemoglobin: 9.5 g/dL — ABNORMAL LOW (ref 12.0–15.0)
Immature Granulocytes: 3 %
Lymphocytes Relative: 11 %
Lymphs Abs: 0.8 10*3/uL (ref 0.7–4.0)
MCH: 30.7 pg (ref 26.0–34.0)
MCHC: 30.5 g/dL (ref 30.0–36.0)
MCV: 100.6 fL — ABNORMAL HIGH (ref 80.0–100.0)
Monocytes Absolute: 0.7 10*3/uL (ref 0.1–1.0)
Monocytes Relative: 9 %
Neutro Abs: 5.6 10*3/uL (ref 1.7–7.7)
Neutrophils Relative %: 75 %
Platelets: 297 10*3/uL (ref 150–400)
RBC: 3.09 MIL/uL — ABNORMAL LOW (ref 3.87–5.11)
RDW: 15.8 % — ABNORMAL HIGH (ref 11.5–15.5)
WBC: 7.3 10*3/uL (ref 4.0–10.5)
nRBC: 0 % (ref 0.0–0.2)

## 2020-01-19 LAB — PROTIME-INR
INR: 1.6 — ABNORMAL HIGH (ref 0.8–1.2)
Prothrombin Time: 18.3 seconds — ABNORMAL HIGH (ref 11.4–15.2)

## 2020-01-19 MED ORDER — WARFARIN SODIUM 10 MG PO TABS
10.0000 mg | ORAL_TABLET | Freq: Once | ORAL | Status: AC
  Administered 2020-01-19: 10 mg via ORAL
  Filled 2020-01-19: qty 1

## 2020-01-19 NOTE — Progress Notes (Signed)
Physical Therapy Treatment Patient Details Name: [name] MRN: [id number] DOB: 04-Dec-1960 Today's Date: 01/19/2020    History of Present Illness Patient is a 59 year old female with history of ESRD on HD MWF, s/p aortic and mitral valve replacement on warfarin therapy who presented with nausea, nonbloody emesis and abdominal pain; found to have spontaneous left retroperitoneal bleed.    PT Comments    Pt admitted with above diagnosis. Pt was able to ambulate with min guard assist and cues with no device and no LOB.  Should continue to progress well.   Pt currently with functional limitations due to balance and endurance deficits. Pt will benefit from skilled PT to increase their independence and safety with mobility to allow discharge to the venue listed below.     Follow Up Recommendations  No PT follow up;Supervision - Intermittent     Equipment Recommendations  None recommended by PT    Recommendations for Other Services       Precautions / Restrictions Precautions Precautions: Fall Precaution Comments: easily nauseated Restrictions Weight Bearing Restrictions: No    Mobility  Bed Mobility Overal bed mobility: Modified Independent                Transfers Overall transfer level: Needs assistance Equipment used: None Transfers: Sit to/from Stand;Stand Pivot Transfers Sit to Stand: Supervision Stand pivot transfers: Supervision       General transfer comment: supervision for lines  Ambulation/Gait Ambulation/Gait assistance: Supervision Gait Distance (Feet): 350 Feet Assistive device: None Gait Pattern/deviations: Step-through pattern;Decreased stride length Gait velocity: reduced Gait velocity interpretation: <1.31 ft/sec, indicative of household ambulator General Gait Details: Pt without LOB. Does report she may have gotten up too quick however she functioned well and no LOB.  Did not need device.    Stairs             Wheelchair  Mobility    Modified Rankin (Stroke Patients Only)       Balance Overall balance assessment: No apparent balance deficits (not formally assessed)                                          Cognition Arousal/Alertness: Awake/alert Behavior During Therapy: WFL for tasks assessed/performed Overall Cognitive Status: Within Functional Limits for tasks assessed                                        Exercises General Exercises - Lower Extremity Long Arc Quad: Strengthening;10 reps;AROM;Seated Hip Flexion/Marching: AROM;Both;10 reps;Seated    General Comments        Pertinent Vitals/Pain Pain Assessment: Faces Faces Pain Scale: Hurts little more Pain Location: back Pain Descriptors / Indicators: Grimacing;Guarding Pain Intervention(s): Limited activity within patient's tolerance;Monitored during session;Repositioned    Home Living                      Prior Function            PT Goals (current goals can now be found in the care plan section) Acute Rehab PT Goals Patient Stated Goal: manage her symptoms Progress towards PT goals: Progressing toward goals    Frequency    Min 2X/week      PT Plan Current plan remains appropriate    Co-evaluation  AM-PAC PT "6 Clicks" Mobility   Outcome Measure  Help needed turning from your back to your side while in a flat bed without using bedrails?: None Help needed moving from lying on your back to sitting on the side of a flat bed without using bedrails?: None Help needed moving to and from a bed to a chair (including a wheelchair)?: None Help needed standing up from a chair using your arms (e.g., wheelchair or bedside chair)?: None Help needed to walk in hospital room?: A Little Help needed climbing 3-5 steps with a railing? : A Little 6 Click Score: 22    End of Session Equipment Utilized During Treatment: Gait belt Activity Tolerance: Patient tolerated  treatment well Patient left: in bed;with call bell/phone within reach Nurse Communication: Mobility status PT Visit Diagnosis: Muscle weakness (generalized) (M62.81)     Time: 3958-4417 PT Time Calculation (min) (ACUTE ONLY): 14 min  Charges:  $Gait Training: 8-22 mins                     Eidan Muellner W,PT Acute Rehabilitation Services Pager:  [phone]  Office:  [place] 01/19/2020, 1:39 PM

## 2020-01-19 NOTE — Progress Notes (Signed)
[name] Progress Note   Subjective:     INR 1.6  HD yest: ~2L UF  No c/o this AM  Objective Vitals:   01/18/20 1924 01/19/20 0457 01/19/20 0501 01/19/20 0800  BP: 131/78 138/79  119/78  Pulse: 85 81  89  Resp: 17 18  18   Temp:  98.6 F (37 C)  98.1 F (36.7 C)  TempSrc:  Oral  Oral  SpO2: 98% 99%  97%  Weight:   62.5 kg   Height:       Physical Exam General:WDWN, thin female in NAD  Heart:RRR +6/3 systolic murmur, +mechanical heart sounds Lungs:CTAB, faint crackles in bases Abdomen:soft, NTND Extremities:no LE edema Dialysis Access: RU AVF +b   Filed Weights   01/18/20 0733 01/18/20 1145 01/19/20 0501  Weight: 61.5 kg 62.2 kg 62.5 kg    Intake/Output Summary (Last 24 hours) at 01/19/2020 0935 Last data filed at 01/19/2020 0800 Gross per 24 hour  Intake 966.5 ml  Output 1982 ml  Net -1015.5 ml    Additional Objective Labs: Basic Metabolic Panel: Recent Labs  Lab 01/17/20 0417 01/18/20 0801 01/19/20 0710  NA 128* 130* 133*  K 3.8 4.2 3.9  CL 90* 89* 94*  CO2 24 23 26   GLUCOSE 86 83 81  BUN 44* 61* 22*  CREATININE 7.04* 9.86* 5.23*  CALCIUM 10.0 10.0 8.8*  PHOS 4.9* 4.9* 3.5   Liver Function Tests: Recent Labs  Lab 01/17/20 0417 01/18/20 0801 01/19/20 0710  ALBUMIN 2.8* 2.8* 2.8*   CBC: Recent Labs  Lab 01/14/20 1744 01/14/20 1744 01/15/20 0532 01/15/20 0532 01/16/20 0251 01/17/20 0417 01/18/20 0804  WBC 8.5   < > 7.8   < > 8.2 8.4 8.8  HGB 9.0*   < > 8.8*   < > 10.1* 9.3* 9.7*  HCT 27.8*   < > 27.2*   < > 31.6* 28.9* 29.3*  MCV 96.5  --  96.1  --  97.8 96.7 96.4  PLT 257   < > 257   < > 323 295 315   < > = values in this interval not displayed.   Blood Culture    Component Value Date/Time   SDES FLUID RIGHT PLEURAL 01/07/2019 1513   SDES FLUID RIGHT PLEURAL 01/07/2019 1513   [place] 01/07/2019 1513   SPECREQUEST BOTTLES DRAWN AEROBIC AND ANAEROBIC 10CC 01/07/2019 1513   CULT  01/07/2019 1513     NO GROWTH 5 DAYS Performed at [facility], [place] [address], [place], [place] [address]    REPTSTATUS 01/07/2019 FINAL 01/07/2019 1513   REPTSTATUS 01/12/2019 FINAL 01/07/2019 1513   Lab Results  Component Value Date   INR 1.6 (H) 01/19/2020   INR 1.4 (H) 01/18/2020   INR 1.2 01/17/2020   Studies/Results: No results found.  Medications:  sodium chloride     heparin 1,000 Units/hr (01/18/20 1826)    calcitRIOL  2 mcg Oral Q M,W,F   Chlorhexidine Gluconate Cloth  6 each Topical Q0600   cinacalcet  180 mg Oral Q M,W,F-HD   docusate sodium  100 mg Oral Daily   ferric citrate  840 mg Oral TID WC   metoprolol tartrate  25 mg Oral BID   multivitamin  1 tablet Oral QHS   pantoprazole  40 mg Oral QAC breakfast   polyethylene glycol  17 g Oral BID   senna  1 tablet Oral Daily   simethicone  80 mg Oral TID   warfarin  10  mg Oral ONCE-1600   Warfarin - Pharmacist Dosing Inpatient   Does not apply [id number]    Dialysis Orders: MWF HD  4h  61kg  450/800  2/2 bath  P2  Hep 1800  -sensipar 180mg  -calcitriol 32mcg -no esa, no iron  Assessment/Plan: 1. Retroperitoneal bleed/L perirenal bleed/hematoma -  Seen on CT 8/18.  S/p 2 units FFP, 4 units pRBC total and Vit K.  IV heparin started 8/21 and stopped 8/22, given more pRBC.  IV hep and coumadin restarted 8/23.  INR remains subtherapeutic, 1.6 today.  Seen by Urology, if rebleeds plan to embolize L kidney.  Hgb stable in 9s 2. Rectal bleeding - resolved.  Nml BM yesterday.  3. Hx AVR/MVR - on chronic anticoagulation.  Cardiology following.  4. ESRD - on HD MWF.  Orders written for HD tomorrow pre regular schedule.  K 3.8 5. Anemia of CKD- As above  S/p 4 units pRBC.  TSAT 84% 8/30.   6. Secondary hyperparathyroidism - Phos at goal. Cont low Ca bath. . Continue VDRA and sensipar. 7. HTN/volume - BP stable, UF to 61kg 8. Nutrition - Renal diet w/fluid restrictions. Alb 2.8. Protein supplements.   [facility] 01/19/2020,9:35 AM  LOS: 13 days

## 2020-01-19 NOTE — Progress Notes (Signed)
ANTICOAGULATION CONSULT NOTE - Follow Up Consult  Pharmacy Consult for Heparin + Coumadin Indication: artificial heart valves  No Known Allergies  Patient Measurements: Height: 5\' 6"  (167.6 cm) Weight: 62.5 kg (137 lb 11.2 oz) IBW/kg (Calculated) : 59.3   Heparin dosing weight 61.5 kg   Vital Signs: Temp: 98.1 F (36.7 C) (08/31 0800) Temp Source: Oral (08/31 0800) BP: 119/78 (08/31 0800) Pulse Rate: 89 (08/31 0800)  Labs: Recent Labs    01/17/20 0417 01/17/20 0417 01/18/20 0500 01/18/20 0801 01/18/20 0804 01/18/20 0810 01/18/20 1848 01/19/20 0710  HGB 9.3*  --   --   --  9.7*  --   --   --   HCT 28.9*  --   --   --  29.3*  --   --   --   PLT 295  --   --   --  315  --   --   --   LABPROT 14.5  --   --   --   --  16.3*  --  18.3*  INR 1.2  --   --   --   --  1.4*  --  1.6*  HEPARINUNFRC 0.42   < > 0.68  --   --   --  0.37 0.39  CREATININE 7.04*  --   --  9.86*  --   --   --  5.23*   < > = values in this interval not displayed.    Estimated Creatinine Clearance: 11.1 mL/min (A) (by C-G formula based on SCr of 5.23 mg/dL (H)).    HPI: s/p RP hematoma. Warfarin PTA for hx mechanical AVR/MVR (goal 2.5-3.5) reversed vit K 10mg  IV + 2 units FFP on 8/18.   Heparin resumed on 8/23 and resumed warfarin 8/25. Noted with BRBPR on 8/26 and no further blood has been noted.  PTA Warfarin: 9 mg daily except 6 mg on Mondays- last 8/18  (INR was 3.6 on admit 8/18)  Assessment: Heparin level therapeutic at 0.39 on heparin rate 1000 units/hr.  INR subtherapeutic at 1.6 although trending upward toward goal 2.5-3.5. Hgb, Hct stable and Plt within normal limits on 8/30.  On 8/30 pm RN reported from patient who noted a few drops of blood that she thought was from urine. Patient has ESRD on HD patient with minimal UOP.  No further bleeding noted later 8/30 pm.  MD notes no further bloody bowel movements.  Hgb stable 9s, Baseline ~ 8-9, pltc wnl on 8/30.   Goal of Therapy:  Heparin  level 0.3-0.5 units/ml  INR goal 2.5- 3.5 Monitor platelets by anticoagulation protocol: Yes   Plan:  - Continue heparin drip at 1000 units/hr - Coumadin 10 mg po x 1 as previously ordered - Daily HL, CBC, INR - Monitor for s/sx of bleeding  Thank you for involving pharmacy in this patient's care.  Nicole Cella, RPh Clinical Pharmacist [phone]  Please check AMION for all [place] phone numbers After 10:00 PM, call [place] [phone] 01/19/2020 8:59 AM  **Pharmacist phone directory can be found on [email or web address] listed under [place]**

## 2020-01-19 NOTE — Progress Notes (Signed)
[facility] PROGRESS NOTE    Progress Note  [name]  GYK:[id number] DOB: Oct 22, 1960 DOA: 01/06/2020 PCP: Benito Mccreedy, MD     Brief Narrative:   [name] is an 59 y.o. female past medical history of end-stage renal disease on hemodialysis Monday Wednesday and Friday status post aortic and mitral valve replacement on Coumadin who presents with nausea and nonbloody emesis, she was found to have a spontaneous left retroperitoneal bleed she status post 1 unit of packed red blood cells 2 units of fresh frozen plasma and vitamin K,  Assessment/Plan:   Acute blood loss anemia secondary to spontaneous left retroperitoneal bleed while on Coumadin: Baseline hemoglobin around 8-9, low on admission she was transfused several units. Transfuse as needed if hemoglobin less than 7 or symptomatic. She is currently on IV heparin, starting oral Coumadin today goal INR 2.5-3.5 No further bloody bowel movements her hemoglobin has remained stable, continue IV heparin and Coumadin. INR is trending up this morning is 1.6 continue Coumadin per pharmacy.  Aortic and mitral valve stenosis status post AVR and MVR: Continue heparin per pharmacy appreciate assistance. Start Coumadin per pharmacy.  End-stage renal disease on Monday Wednesdays and Fridays: Continue HD per nephrology.  Prolonged QTC: Continue to monitor limit QT prolonging drugs.  Constipation: Continue Senokot and MiraLAX, she relates her last bowel movement was about 7 days ago.   DVT prophylaxis: heparin & coumadin Family Communication:none Status is: Inpatient  Remains inpatient appropriate because:Hemodynamically unstable   Dispo: The patient is from: Home              Anticipated d/c is to: Home              Anticipated d/c date is: 3 days              Patient currently is not medically stable to d/c.  Once INR reaches a therapeutic level.  Goal 2.5-3.5.        Code Status:     Code  Status Orders  (From admission, onward)         Start     Ordered   01/06/20 1945  Full code  Continuous        01/06/20 1946        Code Status History    Date Active Date Inactive Code Status Order ID Comments User Context   01/06/2019 1833 01/09/2019 2258 Full Code [id number]  Merton Border, MD Inpatient   12/02/2018 1402 12/11/2018 1548 Full Code [id number]  Rexene Alberts, MD Inpatient   07/24/2018 0951 07/24/2018 1652 Full Code [id number]  Larey Dresser, MD Inpatient   03/14/2018 2044 03/15/2018 2213 Full Code [id number]  Shela Leff, MD ED   10/21/2017 2044 10/25/2017 1848 Full Code [id number]  Vianne Bulls, MD ED   01/27/2017 0435 01/30/2017 1756 Full Code [id number]  Quintella Baton, MD ED   10/09/2016 1548 10/10/2016 2135 Full Code [id number]  Latanya [name], MD Inpatient   05/01/2015 2236 05/05/2015 1850 Full Code [id number]  Norval Morton, MD Inpatient   07/25/2014 2354 08/01/2014 1724 Full Code [id number]  Allie Bossier, MD ED   Advance Care Planning Activity        IV Access:    Peripheral IV   Procedures and diagnostic studies:   No results found.   Medical Consultants:    None.  Anti-Infectives:   none  Subjective:    [name] feels great no bloody bowel movements.  Objective:  Vitals:   01/18/20 1924 01/19/20 0457 01/19/20 0501 01/19/20 0800  BP: 131/78 138/79  119/78  Pulse: 85 81  89  Resp: 17 18  18   Temp:  98.6 F (37 C)  98.1 F (36.7 C)  TempSrc:  Oral  Oral  SpO2: 98% 99%  97%  Weight:   62.5 kg   Height:       SpO2: 97 % O2 Flow Rate (L/min): 2 L/min   Intake/Output Summary (Last 24 hours) at 01/19/2020 0838 Last data filed at 01/19/2020 0800 Gross per 24 hour  Intake 966.5 ml  Output 1982 ml  Net -1015.5 ml   Filed Weights   01/18/20 0733 01/18/20 1145 01/19/20 0501  Weight: 61.5 kg 62.2 kg 62.5 kg    Exam: General exam: In no acute distress. Respiratory system: Good air movement and clear to  auscultation. Cardiovascular system: S1 & S2 heard, RRR. No JVD. Gastrointestinal system: Abdomen is nondistended, soft and nontender.  Extremities: No pedal edema. Skin: No rashes, lesions or ulcers  Data Reviewed:    Labs: Basic Metabolic Panel: Recent Labs  Lab 01/14/20 0715 01/14/20 0715 01/15/20 0532 01/15/20 0532 01/16/20 0251 01/16/20 0251 01/17/20 0417 01/18/20 0801  NA 132*  --  132*  --  133*  --  128* 130*  K 4.2   < > 4.4   < > 3.7   < > 3.8 4.2  CL 93*  --  93*  --  90*  --  90* 89*  CO2 25  --  24  --  27  --  24 23  GLUCOSE 92  --  87  --  96  --  86 83  BUN 30*  --  46*  --  25*  --  44* 61*  CREATININE 4.98*  --  6.99*  --  4.59*  --  7.04* 9.86*  CALCIUM 9.6  --  9.7  --  10.2  --  10.0 10.0  PHOS 3.6  --  4.2  --  4.0  --  4.9* 4.9*   < > = values in this interval not displayed.   GFR Estimated Creatinine Clearance: 5.9 mL/min (A) (by C-G formula based on SCr of 9.86 mg/dL (H)). Liver Function Tests: Recent Labs  Lab 01/14/20 0715 01/15/20 0532 01/16/20 0251 01/17/20 0417 01/18/20 0801  ALBUMIN 2.6* 2.7* 2.8* 2.8* 2.8*   No results for input(s): LIPASE, AMYLASE in the last 168 hours. No results for input(s): AMMONIA in the last 168 hours. Coagulation profile Recent Labs  Lab 01/15/20 0532 01/16/20 0251 01/17/20 0417 01/18/20 0810 01/19/20 0710  INR 1.1 1.2 1.2 1.4* 1.6*   COVID-19 Labs  Recent Labs    01/18/20 0804  FERRITIN 418*    Lab Results  Component Value Date   SARSCOV2NAA NEGATIVE 01/06/2020   [place] NEGATIVE 01/06/2019   [place] NEGATIVE 11/28/2018    CBC: Recent Labs  Lab 01/14/20 1744 01/15/20 0532 01/16/20 0251 01/17/20 0417 01/18/20 0804  WBC 8.5 7.8 8.2 8.4 8.8  HGB 9.0* 8.8* 10.1* 9.3* 9.7*  HCT 27.8* 27.2* 31.6* 28.9* 29.3*  MCV 96.5 96.1 97.8 96.7 96.4  PLT 257 257 323 295 315   Cardiac Enzymes: No results for input(s): CKTOTAL, CKMB, CKMBINDEX, TROPONINI in the last 168 hours. BNP  (last 3 results) No results for input(s): PROBNP in the last 8760 hours. CBG: No results for input(s): GLUCAP in the last 168 hours. D-Dimer: No results for input(s): DDIMER in the last 72 hours.  Hgb A1c: No results for input(s): HGBA1C in the last 72 hours. Lipid Profile: No results for input(s): CHOL, HDL, LDLCALC, TRIG, CHOLHDL, LDLDIRECT in the last 72 hours. Thyroid function studies: No results for input(s): TSH, T4TOTAL, T3FREE, THYROIDAB in the last 72 hours.  Invalid input(s): FREET3 Anemia work up: Recent Labs    01/18/20 0804  FERRITIN 418*  TIBC 172*  IRON 145   Sepsis Labs: Recent Labs  Lab 01/15/20 0532 01/16/20 0251 01/17/20 0417 01/18/20 0804  WBC 7.8 8.2 8.4 8.8   Microbiology No results found for this or any previous visit (from the past 240 hour(s)).   Medications:   . calcitRIOL  2 mcg Oral Q M,W,F  . Chlorhexidine Gluconate Cloth  6 each Topical Q0600  . cinacalcet  180 mg Oral Q M,W,F-HD  . docusate sodium  100 mg Oral Daily  . ferric citrate  840 mg Oral TID WC  . metoprolol tartrate  25 mg Oral BID  . multivitamin  1 tablet Oral QHS  . pantoprazole  40 mg Oral QAC breakfast  . polyethylene glycol  17 g Oral BID  . senna  1 tablet Oral Daily  . simethicone  80 mg Oral TID  . Warfarin - Pharmacist Dosing Inpatient   Does not apply q1600   Continuous Infusions: . sodium chloride    . heparin 1,000 Units/hr (01/18/20 1826)      LOS: 74 days   Charlynne Cousins  [facility]  01/19/2020, 8:38 AM

## 2020-01-19 NOTE — Progress Notes (Addendum)
Patient called RN to room that she had gtts of blood in the hat in the commode, RN went in and there was little blood  there. pharmacist made aware due to patient being on heparin gtt. MD also made aware via amion. Patient is stable. Will continue to monitor. Patient now complaining of mild cramp in lower back and abdomen. MD Olevia Bowens notified. New orders are being placed.

## 2020-01-20 ENCOUNTER — Inpatient Hospital Stay (HOSPITAL_COMMUNITY): Payer: Medicare HMO

## 2020-01-20 DIAGNOSIS — R310 Gross hematuria: Secondary | ICD-10-CM

## 2020-01-20 DIAGNOSIS — Z992 Dependence on renal dialysis: Secondary | ICD-10-CM

## 2020-01-20 DIAGNOSIS — N186 End stage renal disease: Secondary | ICD-10-CM

## 2020-01-20 DIAGNOSIS — Z952 Presence of prosthetic heart valve: Secondary | ICD-10-CM

## 2020-01-20 LAB — CBC
HCT: 29.7 % — ABNORMAL LOW (ref 36.0–46.0)
Hemoglobin: 9.6 g/dL — ABNORMAL LOW (ref 12.0–15.0)
MCH: 32 pg (ref 26.0–34.0)
MCHC: 32.3 g/dL (ref 30.0–36.0)
MCV: 99 fL (ref 80.0–100.0)
Platelets: 309 10*3/uL (ref 150–400)
RBC: 3 MIL/uL — ABNORMAL LOW (ref 3.87–5.11)
RDW: 15.6 % — ABNORMAL HIGH (ref 11.5–15.5)
WBC: 8.3 10*3/uL (ref 4.0–10.5)
nRBC: 0 % (ref 0.0–0.2)

## 2020-01-20 LAB — RENAL FUNCTION PANEL
Albumin: 2.8 g/dL — ABNORMAL LOW (ref 3.5–5.0)
Anion gap: 13 (ref 5–15)
BUN: 37 mg/dL — ABNORMAL HIGH (ref 6–20)
CO2: 24 mmol/L (ref 22–32)
Calcium: 9.5 mg/dL (ref 8.9–10.3)
Chloride: 93 mmol/L — ABNORMAL LOW (ref 98–111)
Creatinine, Ser: 7.2 mg/dL — ABNORMAL HIGH (ref 0.44–1.00)
GFR calc Af Amer: 7 mL/min — ABNORMAL LOW (ref >60)
GFR calc non Af Amer: 6 mL/min — ABNORMAL LOW (ref >60)
Glucose, Bld: 88 mg/dL (ref 70–99)
Phosphorus: 3.6 mg/dL (ref 2.5–4.6)
Potassium: 4 mmol/L (ref 3.5–5.1)
Sodium: 130 mmol/L — ABNORMAL LOW (ref 135–145)

## 2020-01-20 LAB — PROTIME-INR
INR: 2.1 — ABNORMAL HIGH (ref 0.8–1.2)
Prothrombin Time: 22.5 seconds — ABNORMAL HIGH (ref 11.4–15.2)

## 2020-01-20 LAB — HEPARIN LEVEL (UNFRACTIONATED): Heparin Unfractionated: 0.32 IU/mL (ref 0.30–0.70)

## 2020-01-20 MED ORDER — WARFARIN SODIUM 7.5 MG PO TABS: 7.5000 mg | ORAL_TABLET | Freq: Once | ORAL | Status: DC

## 2020-01-20 MED ORDER — WARFARIN SODIUM 5 MG PO TABS: 5.0000 mg | ORAL_TABLET | Freq: Once | ORAL | Status: DC

## 2020-01-20 MED ORDER — WARFARIN SODIUM 5 MG PO TABS
5.0000 mg | ORAL_TABLET | Freq: Once | ORAL | Status: AC
  Administered 2020-01-20: 5 mg via ORAL
  Filled 2020-01-20 (×2): qty 1

## 2020-01-20 NOTE — Progress Notes (Signed)
[facility] Progress Note   Subjective:     INR 2.1  Has had grossly bloody small amt urine past 3-4d  [place] this AM confirmed RP hemorrhage, no renal mass differentiated  Hb stable  Objective Vitals:   01/19/20 1542 01/19/20 2144 01/20/20 0521 01/20/20 0819  BP: (!) 163/92 128/80 (!) 160/87 (!) 150/84  Pulse: 86 89 90 95  Resp:  20 17   Temp: 98.6 F (37 C) 98.7 F (37.1 C) 98.6 F (37 C)   TempSrc: Oral Oral Oral   SpO2:  100% 97% 98%  Weight:      Height:       Physical Exam General:WDWN, thin female in NAD  Heart:RRR +1/6 systolic murmur, +mechanical heart sounds Lungs:CTAB, faint crackles in bases Abdomen:soft, NTND Extremities:no LE edema Dialysis Access: RU AVF +b   Filed Weights   01/18/20 0733 01/18/20 1145 01/19/20 0501  Weight: 61.5 kg 62.2 kg 62.5 kg    Intake/Output Summary (Last 24 hours) at 01/20/2020 1118 Last data filed at 01/19/2020 1900 Gross per 24 hour  Intake 240 ml  Output --  Net 240 ml    Additional Objective Labs: Basic Metabolic Panel: Recent Labs  Lab 01/18/20 0801 01/19/20 0710 01/20/20 0551  NA 130* 133* 130*  K 4.2 3.9 4.0  CL 89* 94* 93*  CO2 23 26 24   GLUCOSE 83 81 88  BUN 61* 22* 37*  CREATININE 9.86* 5.23* 7.20*  CALCIUM 10.0 8.8* 9.5  PHOS 4.9* 3.5 3.6   Liver Function Tests: Recent Labs  Lab 01/18/20 0801 01/19/20 0710 01/20/20 0551  ALBUMIN 2.8* 2.8* 2.8*   CBC: Recent Labs  Lab 01/16/20 0251 01/16/20 0251 01/17/20 0417 01/17/20 0417 01/18/20 0804 01/19/20 1509 01/20/20 0551  WBC 8.2   < > 8.4   < > 8.8 7.3 8.3  NEUTROABS  --   --   --   --   --  5.6  --   HGB 10.1*   < > 9.3*   < > 9.7* 9.5* 9.6*  HCT 31.6*   < > 28.9*   < > 29.3* 31.1* 29.7*  MCV 97.8  --  96.7  --  96.4 100.6* 99.0  PLT 323   < > 295   < > 315 297 309   < > = values in this interval not displayed.   Blood Culture    Component Value Date/Time   SDES FLUID RIGHT PLEURAL 01/07/2019 1513   SDES FLUID RIGHT  PLEURAL 01/07/2019 1513   [place] 01/07/2019 1513   SPECREQUEST BOTTLES DRAWN AEROBIC AND ANAEROBIC 10CC 01/07/2019 1513   CULT  01/07/2019 1513    NO GROWTH 5 DAYS Performed at [facility], [place] [address], [place], [place] [address]    REPTSTATUS 01/07/2019 FINAL 01/07/2019 1513   REPTSTATUS 01/12/2019 FINAL 01/07/2019 1513   Lab Results  Component Value Date   INR 2.1 (H) 01/20/2020   INR 1.6 (H) 01/19/2020   INR 1.4 (H) 01/18/2020   Studies/Results: US Abdomen Complete  Result Date: 01/20/2020 CLINICAL DATA:  Follow-up CT EXAM: ABDOMEN ULTRASOUND COMPLETE COMPARISON:  CT abdomen pelvis 01/06/2020 FINDINGS: Gallbladder: No gallbladder wall thickening. There is sludge and small echogenic mural foci, likely representing polyps measuring up to 0.5 cm. No definite shadowing calculi. Negative sonographic Murphy sign. Common bile duct: Diameter: 0.3 cm, within normal limits. Liver: No focal lesion identified. Within normal limits in parenchymal echogenicity. Portal vein is patent on color Doppler imaging with  normal direction of blood flow towards the liver. IVC: No abnormality visualized. Pancreas: Pancreatic duct appears slightly prominent measuring 0.4 cm. Spleen: Multiple granulomas throughout.  Normal size. Right Kidney: Length: 6.6 cm cysts. Echogenicity within normal limits. There are multiple small cysts. No hydronephrosis. Left Kidney: Not visualized secondary to complex mass in the left flank. There is a large heterogeneous mass measuring 18.2 x 7.8 x 8.3 cm, consistent with the previously identified hematoma. Abdominal aorta: No aneurysm visualized. Other findings: None. IMPRESSION: 1. Large complex mass in the left flank measuring 18.2 cm, consistent with the previously identified retroperitoneal hematoma. 2.  Atrophic right kidney with multiple small. 3. Gallbladder polyps measuring up to 0.5 cm. No dedicated follow-up is necessary. 4.  Mildly prominent main pancreatic  duct. Electronically Signed   By: Audie Pinto M.D.   On: 01/20/2020 08:51    Medications: . sodium chloride    . heparin 1,000 Units/hr (01/19/20 1949)   . calcitRIOL  2 mcg Oral Q M,W,F  . Chlorhexidine Gluconate Cloth  6 each Topical Q0600  . cinacalcet  180 mg Oral Q M,W,F-HD  . docusate sodium  100 mg Oral Daily  . ferric citrate  840 mg Oral TID WC  . metoprolol tartrate  25 mg Oral BID  . multivitamin  1 tablet Oral QHS  . pantoprazole  40 mg Oral QAC breakfast  . polyethylene glycol  17 g Oral BID  . senna  1 tablet Oral Daily  . simethicone  80 mg Oral TID  . warfarin  5 mg Oral ONCE-1600  . Warfarin - Pharmacist Dosing Inpatient   Does not apply [id number]    Dialysis Orders: MWF HD  4h  61kg  450/800  2/2 bath  P2  Hep 1800  -sensipar 180mg  -calcitriol 70mcg -no esa, no iron  Assessment/Plan: 1. Retroperitoneal bleed/L perirenal bleed/hematoma -  Seen on CT 8/18.  S/p 2 units FFP, 4 units pRBC total and Vit K.  IV heparin started 8/21 and stopped 8/22, given more pRBC.  IV hep and coumadin restarted 8/23.  INR remains subtherapeutic, 2.1 today.  Seen by Urology, if rebleeds plan to embolize L kidney.  Hgb stable in 9s.  Now with gross hematuria, imaging nondagnostic for renal mass, check CT Abd with contrast today to eval for renal mass 2. Rectal bleeding - resolved.   3. 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 ESRD - on HD MWF.  Orders written for HD today pre regular schedule.  K 4.0 5. Anemia of CKD- As above  S/p 4 units pRBC.  TSAT 84% 8/30.   6. Secondary hyperparathyroidism - Phos at goal. Cont low Ca bath. . Continue VDRA and sensipar. 7. HTN/volume - BP stable, UF to 61kg 8. Nutrition - Renal diet w/fluid restrictions. Alb 2.8. Protein supplements.   [facility] 01/20/2020,11:18 AM  LOS: 14 days

## 2020-01-20 NOTE — Progress Notes (Signed)
PROGRESS NOTE    [name]   ONG:[id number]  DOB: 1961-05-17  DOA: 01/06/2020 PCP: Benito Mccreedy, MD   Brief Narrative:  [name]  is an 59 y.o. female past medical history of end-stage renal disease on hemodialysis Monday Wednesday and Friday status post aortic and mitral valve replacement on Coumadin who presents with nausea and nonbloody emesis, she was found to have a spontaneous left retroperitoneal bleed she status post 1 unit of packed red blood cells 2 units of fresh frozen plasma and vitamin K.   Subjective: She states she is urinating blood today. She only had a very small amount of urine but it is definitely bloody. No abdominal or pelvic pain and no dysuria.  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 - spontaneous hemorrhage - s/p 4 U PRBC, Vit K and 2 U FFP - has resolved and Coumadin has been resumed with Heparin bridge on 8/23 - ultrasound of abdomen reveals stable retroperitoneal bleed - INR now 2.1- awaiting INR of 2.5- being complicated by gross hematuria- see below  Active Problems: Gross hematuria - obtain CT abd./pelvis to see if a source of bleeding can be found- may need to consult urology - current amount of blood is small and Hb is stable  End-stage renal disease on hemodialysis  Secondary hyperparathyroidism - cont HD on MWF    Time spent in minutes: 78- discussed plan with Dr Joelyn Oms DVT prophylaxis: Heparin/ Coumadin Code Status: Full code Family Communication:  Disposition Plan:  Status is: Inpatient  Remains inpatient appropriate because:cont to follow INR, continue work up on gross hematuria   Dispo: The patient is from: Home              Anticipated d/c is to: Home              Anticipated d/c date is: 3 days              Patient currently is not medically stable to d/c.  Consultants:   Nephrology  Cardiology   Urology Procedures:       Antimicrobials:  Anti-infectives (From admission, onward)   None       Objective: Vitals:   01/20/20 1415 01/20/20 1430 01/20/20 1500 01/20/20 1530  BP: (!) 179/104 (!) 172/93 (!) 144/83 117/68  Pulse: 99 (!) 105 (!) 106 (!) 109  Resp: 16  17 15   Temp:      TempSrc:      SpO2:      Weight:      Height:        Intake/Output Summary (Last 24 hours) at 01/20/2020 1602 Last data filed at 01/19/2020 1900 Gross per 24 hour  Intake 120 ml  Output --  Net 120 ml   Filed Weights   01/18/20 1145 01/19/20 0501 01/20/20 1400  Weight: 62.2 kg 62.5 kg 64.5 kg    Examination: General exam: Appears comfortable  HEENT: PERRLA, oral mucosa moist, no sclera icterus or thrush Respiratory system: Clear to auscultation. Respiratory effort normal. Cardiovascular system: S1 & S2 heard, RRR.   Gastrointestinal system: Abdomen soft, non-tender, nondistended. Normal bowel sounds. Central nervous system: Alert and oriented. No focal neurological deficits. Extremities: No cyanosis, clubbing or edema Skin: No rashes or ulcers Psychiatry:  Mood & affect appropriate.     Data Reviewed: I have personally reviewed following labs and imaging studies  CBC: Recent Labs  Lab 01/16/20 0251 01/17/20 0417 01/18/20 0804  01/19/20 1509 01/20/20 0551  WBC 8.2 8.4 8.8 7.3 8.3  NEUTROABS  --   --   --  5.6  --   HGB 10.1* 9.3* 9.7* 9.5* 9.6*  HCT 31.6* 28.9* 29.3* 31.1* 29.7*  MCV 97.8 96.7 96.4 100.6* 99.0  PLT 323 295 315 297 253   Basic Metabolic Panel: Recent Labs  Lab 01/16/20 0251 01/17/20 0417 01/18/20 0801 01/19/20 0710 01/20/20 0551  NA 133* 128* 130* 133* 130*  K 3.7 3.8 4.2 3.9 4.0  CL 90* 90* 89* 94* 93*  CO2 27 24 23 26 24   GLUCOSE 96 86 83 81 88  BUN 25* 44* 61* 22* 37*  CREATININE 4.59* 7.04* 9.86* 5.23* 7.20*  CALCIUM 10.2 10.0 10.0 8.8* 9.5  PHOS 4.0 4.9* 4.9* 3.5 3.6   GFR: Estimated Creatinine Clearance: 8.1 mL/min (A) (by C-G formula based on SCr of 7.2 mg/dL  (H)). Liver Function Tests: Recent Labs  Lab 01/16/20 0251 01/17/20 0417 01/18/20 0801 01/19/20 0710 01/20/20 0551  ALBUMIN 2.8* 2.8* 2.8* 2.8* 2.8*   No results for input(s): LIPASE, AMYLASE in the last 168 hours. No results for input(s): AMMONIA in the last 168 hours. Coagulation Profile: Recent Labs  Lab 01/16/20 0251 01/17/20 0417 01/18/20 0810 01/19/20 0710 01/20/20 0551  INR 1.2 1.2 1.4* 1.6* 2.1*   Cardiac Enzymes: No results for input(s): CKTOTAL, CKMB, CKMBINDEX, TROPONINI in the last 168 hours. BNP (last 3 results) No results for input(s): PROBNP in the last 8760 hours. HbA1C: No results for input(s): HGBA1C in the last 72 hours. CBG: No results for input(s): GLUCAP in the last 168 hours. Lipid Profile: No results for input(s): CHOL, HDL, LDLCALC, TRIG, CHOLHDL, LDLDIRECT in the last 72 hours. Thyroid Function Tests: No results for input(s): TSH, T4TOTAL, FREET4, T3FREE, THYROIDAB in the last 72 hours. Anemia Panel: Recent Labs    01/18/20 0804  FERRITIN 418*  TIBC 172*  IRON 145   Urine analysis:    Component Value Date/Time   COLORURINE YELLOW 05/01/2015 2005   APPEARANCEUR CLEAR 05/01/2015 2005   LABSPEC 1.012 05/01/2015 2005   PHURINE 8.5 (H) 05/01/2015 2005   GLUCOSEU 100 (A) 05/01/2015 2005   HGBUR SMALL (A) 05/01/2015 2005   BILIRUBINUR NEGATIVE 05/01/2015 2005   KETONESUR NEGATIVE 05/01/2015 2005   PROTEINUR 100 (A) 05/01/2015 2005   UROBILINOGEN 0.2 07/27/2014 0150   NITRITE NEGATIVE 05/01/2015 2005   LEUKOCYTESUR NEGATIVE 05/01/2015 2005   Sepsis Labs: @LABRCNTIP (procalcitonin:4,lacticidven:4) )No results found for this or any previous visit (from the past 240 hour(s)).       Radiology Studies: US Abdomen Complete  Result Date: 01/20/2020 CLINICAL DATA:  Follow-up CT EXAM: ABDOMEN ULTRASOUND COMPLETE COMPARISON:  CT abdomen pelvis 01/06/2020 FINDINGS: Gallbladder: No gallbladder wall thickening. There is sludge and small  echogenic mural foci, likely representing polyps measuring up to 0.5 cm. No definite shadowing calculi. Negative sonographic Murphy sign. Common bile duct: Diameter: 0.3 cm, within normal limits. Liver: No focal lesion identified. Within normal limits in parenchymal echogenicity. Portal vein is patent on color Doppler imaging with normal direction of blood flow towards the liver. IVC: No abnormality visualized. Pancreas: Pancreatic duct appears slightly prominent measuring 0.4 cm. Spleen: Multiple granulomas throughout.  Normal size. Right Kidney: Length: 6.6 cm cysts. Echogenicity within normal limits. There are multiple small cysts. No hydronephrosis. Left Kidney: Not visualized secondary to complex mass in the left flank. There is a large heterogeneous mass measuring 18.2 x 7.8 x 8.3 cm, consistent with the previously  identified hematoma. Abdominal aorta: No aneurysm visualized. Other findings: None. IMPRESSION: 1. Large complex mass in the left flank measuring 18.2 cm, consistent with the previously identified retroperitoneal hematoma. 2.  Atrophic right kidney with multiple small. 3. Gallbladder polyps measuring up to 0.5 cm. No dedicated follow-up is necessary. 4.  Mildly prominent main pancreatic duct. Electronically Signed   By: Audie Pinto M.D.   On: 01/20/2020 08:51      Scheduled Meds: . calcitRIOL  2 mcg Oral Q M,W,F  . Chlorhexidine Gluconate Cloth  6 each Topical Q0600  . cinacalcet  180 mg Oral Q M,W,F-HD  . docusate sodium  100 mg Oral Daily  . ferric citrate  840 mg Oral TID WC  . metoprolol tartrate  25 mg Oral BID  . multivitamin  1 tablet Oral QHS  . pantoprazole  40 mg Oral QAC breakfast  . polyethylene glycol  17 g Oral BID  . senna  1 tablet Oral Daily  . simethicone  80 mg Oral TID  . Warfarin - Pharmacist Dosing Inpatient   Does not apply q1600   Continuous Infusions: . sodium chloride    . heparin 1,000 Units/hr (01/19/20 1949)     LOS: 14 days      Debbe Odea, MD [facility] Pager: [email or web address] 01/20/2020, 4:02 PM

## 2020-01-20 NOTE — Progress Notes (Addendum)
ANTICOAGULATION CONSULT NOTE - Follow Up Consult  Pharmacy Consult for Heparin + Coumadin Indication: artificial heart valves  No Known Allergies  Patient Measurements: Height: 5\' 6"  (167.6 cm) Weight: 62.5 kg (137 lb 11.2 oz) IBW/kg (Calculated) : 59.3   Heparin dosing weight 61.5 kg   Vital Signs: Temp: 98.6 F (37 C) (09/01 0521) Temp Source: Oral (09/01 0521) BP: 150/84 (09/01 0819) Pulse Rate: 95 (09/01 0819)  Labs: Recent Labs    01/18/20 0500 01/18/20 0801 01/18/20 0804 01/18/20 0804 01/18/20 0810 01/18/20 1848 01/19/20 0710 01/19/20 1509 01/20/20 0551  HGB  --   --  9.7*   < >  --   --   --  9.5* 9.6*  HCT  --   --  29.3*  --   --   --   --  31.1* 29.7*  PLT  --   --  315  --   --   --   --  297 309  LABPROT  --   --   --   --  16.3*  --  18.3*  --  22.5*  INR  --   --   --   --  1.4*  --  1.6*  --  2.1*  HEPARINUNFRC   < >  --   --   --   --  0.37 0.39  --  0.32  CREATININE  --  9.86*  --   --   --   --  5.23*  --  7.20*   < > = values in this interval not displayed.    Estimated Creatinine Clearance: 8.1 mL/min (A) (by C-G formula based on SCr of 7.2 mg/dL (H)).    HPI: s/p RP hematoma. Warfarin PTA for hx mechanical AVR/MVR (goal 2.5-3.5) reversed vit K 10mg  IV + 2 units FFP on 8/18.   Heparin resumed on 8/23 and resumed warfarin 8/25. Noted with [facility] on 8/26 and no further blood has been noted.  PTA Warfarin: 9 mg daily except 6 mg on Mondays- last 8/18  (INR was 3.6 on admit 8/18)  Assessment: Heparin level therapeutic at 0.32 on heparin rate 1000 units/hr.  INR increased to 2.1, subtherapeutic although trending up toward goal 2.5-3.5. Hgb, Hct stable and Plt within normal limits on 8/30.  On 8/30 pm RN reported from patient who noted a few drops of blood that she thought was from urine and another episode noted by patient on 8/31 afternoon that had a small amount of blood in the commode. Also complained of mild cramp in lower back and abdomen.  RN  notified MD. Patient has ESRD on HD  with minimal UOP. Hgb stable 9s, Baseline ~ 8-9, pltc wnl on 8/30.   MD ordered abdominal US.   Goal of Therapy:  Heparin level 0.3-0.5 units/ml  INR goal 2.5- 3.5 Monitor platelets by anticoagulation protocol: Yes   Plan:  - Continue heparin drip at 1000 units/hr - Coumadin 5 mg po x 1 - Daily HL, CBC, INR - Monitor for s/sx of bleeding  f/u abdominal US.  Thank you for involving pharmacy in this patient's care.  Nicole Cella, RPh Clinical Pharmacist [phone] **Pharmacist phone directory can be found on [email or web address] listed under Day** 01/20/2020 8:30 AM

## 2020-01-21 ENCOUNTER — Other Ambulatory Visit: Payer: Medicare HMO

## 2020-01-21 LAB — RENAL FUNCTION PANEL
Albumin: 2.8 g/dL — ABNORMAL LOW (ref 3.5–5.0)
Anion gap: 12 (ref 5–15)
BUN: 15 mg/dL (ref 6–20)
CO2: 29 mmol/L (ref 22–32)
Calcium: 9.3 mg/dL (ref 8.9–10.3)
Chloride: 92 mmol/L — ABNORMAL LOW (ref 98–111)
Creatinine, Ser: 4.59 mg/dL — ABNORMAL HIGH (ref 0.44–1.00)
GFR calc Af Amer: 11 mL/min — ABNORMAL LOW (ref >60)
GFR calc non Af Amer: 10 mL/min — ABNORMAL LOW (ref >60)
Glucose, Bld: 92 mg/dL (ref 70–99)
Phosphorus: 3.3 mg/dL (ref 2.5–4.6)
Potassium: 3.8 mmol/L (ref 3.5–5.1)
Sodium: 133 mmol/L — ABNORMAL LOW (ref 135–145)

## 2020-01-21 LAB — CBC
HCT: 32.3 % — ABNORMAL LOW (ref 36.0–46.0)
Hemoglobin: 10.5 g/dL — ABNORMAL LOW (ref 12.0–15.0)
MCH: 32.1 pg (ref 26.0–34.0)
MCHC: 32.5 g/dL (ref 30.0–36.0)
MCV: 98.8 fL (ref 80.0–100.0)
Platelets: 311 10*3/uL (ref 150–400)
RBC: 3.27 MIL/uL — ABNORMAL LOW (ref 3.87–5.11)
RDW: 16 % — ABNORMAL HIGH (ref 11.5–15.5)
WBC: 8.6 10*3/uL (ref 4.0–10.5)
nRBC: 0 % (ref 0.0–0.2)

## 2020-01-21 LAB — PROTIME-INR
INR: 1.6 — ABNORMAL HIGH (ref 0.8–1.2)
Prothrombin Time: 18.5 seconds — ABNORMAL HIGH (ref 11.4–15.2)

## 2020-01-21 LAB — HEPARIN LEVEL (UNFRACTIONATED): Heparin Unfractionated: 0.33 IU/mL (ref 0.30–0.70)

## 2020-01-21 MED ORDER — IOHEXOL 300 MG/ML  SOLN
100.0000 mL | Freq: Once | INTRAMUSCULAR | Status: AC | PRN
  Administered 2020-01-21: 100 mL via INTRAVENOUS

## 2020-01-21 MED ORDER — WARFARIN SODIUM 3 MG PO TABS
9.0000 mg | ORAL_TABLET | Freq: Once | ORAL | Status: AC
  Administered 2020-01-21: 9 mg via ORAL
  Filled 2020-01-21: qty 3

## 2020-01-21 NOTE — Progress Notes (Signed)
[facility] Progress Note   Subjective:     INR 1.6  CT didn't identify any renal mass  Still passing bloody urine  HD yesterday: 2.7L UF  Hb stable  Objective Vitals:   01/20/20 1957 01/21/20 0545 01/21/20 0552 01/21/20 1037  BP: (!) 146/82 (!) 151/92  (!) 152/88  Pulse: (!) 103 96  96  Resp:  18  14  Temp:  97.9 F (36.6 C)  98.5 F (36.9 C)  TempSrc:  Oral  Oral  SpO2: 100% 100%  100%  Weight:   61.4 kg   Height:       Physical Exam General:WDWN, thin female in NAD  Heart:RRR +7/1 systolic murmur, +mechanical heart sounds Lungs:CTAB, faint crackles in bases Abdomen:soft, NTND Extremities:no LE edema Dialysis Access: RU AVF +b   Filed Weights   01/19/20 0501 01/20/20 1400 01/21/20 0552  Weight: 62.5 kg 64.5 kg 61.4 kg    Intake/Output Summary (Last 24 hours) at 01/21/2020 1109 Last data filed at 01/21/2020 0846 Gross per 24 hour  Intake 784.69 ml  Output 2754 ml  Net -1969.31 ml    Additional Objective Labs: Basic Metabolic Panel: Recent Labs  Lab 01/19/20 0710 01/20/20 0551 01/21/20 0459  NA 133* 130* 133*  K 3.9 4.0 3.8  CL 94* 93* 92*  CO2 26 24 29   GLUCOSE 81 88 92  BUN 22* 37* 15  CREATININE 5.23* 7.20* 4.59*  CALCIUM 8.8* 9.5 9.3  PHOS 3.5 3.6 3.3   Liver Function Tests: Recent Labs  Lab 01/19/20 0710 01/20/20 0551 01/21/20 0459  ALBUMIN 2.8* 2.8* 2.8*   CBC: Recent Labs  Lab 01/17/20 0417 01/17/20 0417 01/18/20 0804 01/18/20 0804 01/19/20 1509 01/20/20 0551 01/21/20 0459  WBC 8.4   < > 8.8   < > 7.3 8.3 8.6  NEUTROABS  --   --   --   --  5.6  --   --   HGB 9.3*   < > 9.7*   < > 9.5* 9.6* 10.5*  HCT 28.9*   < > 29.3*   < > 31.1* 29.7* 32.3*  MCV 96.7  --  96.4  --  100.6* 99.0 98.8  PLT 295   < > 315   < > 297 309 311   < > = values in this interval not displayed.   Blood Culture    Component Value Date/Time   SDES FLUID RIGHT PLEURAL 01/07/2019 1513   SDES FLUID RIGHT PLEURAL 01/07/2019 1513    [place] 01/07/2019 1513   SPECREQUEST BOTTLES DRAWN AEROBIC AND ANAEROBIC 10CC 01/07/2019 1513   CULT  01/07/2019 1513    NO GROWTH 5 DAYS Performed at [facility], [place] [address] [place], [place] [address]    REPTSTATUS 01/07/2019 FINAL 01/07/2019 1513   REPTSTATUS 01/12/2019 FINAL 01/07/2019 1513   Lab Results  Component Value Date   INR 1.6 (H) 01/21/2020   INR 2.1 (H) 01/20/2020   INR 1.6 (H) 01/19/2020   Studies/Results: US Abdomen Complete  Result Date: 01/20/2020 CLINICAL DATA:  Follow-up CT EXAM: ABDOMEN ULTRASOUND COMPLETE COMPARISON:  CT abdomen pelvis 01/06/2020 FINDINGS: Gallbladder: No gallbladder wall thickening. There is sludge and small echogenic mural foci, likely representing polyps measuring up to 0.5 cm. No definite shadowing calculi. Negative sonographic Murphy sign. Common bile duct: Diameter: 0.3 cm, within normal limits. Liver: No focal lesion identified. Within normal limits in parenchymal echogenicity. Portal vein is patent on color Doppler imaging with normal direction of blood  flow towards the liver. IVC: No abnormality visualized. Pancreas: Pancreatic duct appears slightly prominent measuring 0.4 cm. Spleen: Multiple granulomas throughout.  Normal size. Right Kidney: Length: 6.6 cm cysts. Echogenicity within normal limits. There are multiple small cysts. No hydronephrosis. Left Kidney: Not visualized secondary to complex mass in the left flank. There is a large heterogeneous mass measuring 18.2 x 7.8 x 8.3 cm, consistent with the previously identified hematoma. Abdominal aorta: No aneurysm visualized. Other findings: None. IMPRESSION: 1. Large complex mass in the left flank measuring 18.2 cm, consistent with the previously identified retroperitoneal hematoma. 2.  Atrophic right kidney with multiple small. 3. Gallbladder polyps measuring up to 0.5 cm. No dedicated follow-up is necessary. 4.  Mildly prominent main pancreatic duct. Electronically Signed    By: Audie Pinto M.D.   On: 01/20/2020 08:51   CT ABDOMEN PELVIS W CONTRAST  Result Date: 01/21/2020 CLINICAL DATA:  Recurrent retroperitoneal hemorrhage, hematuria, concern for renal mass EXAM: CT ABDOMEN AND PELVIS WITH CONTRAST TECHNIQUE: Multidetector CT imaging of the abdomen and pelvis was performed using the standard protocol following bolus administration of intravenous contrast. CONTRAST:  132mL OMNIPAQUE IOHEXOL 300 MG/ML  SOLN COMPARISON:  01/20/2020, 01/06/2020 FINDINGS: Lower chest: Hypoventilatory changes are seen at the lung bases. No acute pleural or parenchymal lung disease. Postsurgical changes from aortic and mitral valve replacements. Hepatobiliary: Gallbladder sludge layers dependently in the gallbladder. No cholecystitis. The liver is unremarkable. Pancreas: Unremarkable. No pancreatic ductal dilatation or surrounding inflammatory changes. Spleen: Diffuse splenic calcifications unchanged. Adrenals/Urinary Tract: There has been interval organization of the retroperitoneal hemorrhage seen previously. Retroperitoneal hematoma along the ventral aspect of the left kidney measures approximately 8.0 x 8.8 cm in transverse dimension. There does appear to be and intraperitoneal extent of the organizing hematoma, contiguous with the retroperitoneal hematoma, measuring 7.7 x 6.9 cm in transverse dimension. In its entirety, the organizing hematoma extends approximately 17 cm in craniocaudal length. On this single phase exam, no underlying left renal mass can be observed. The kidneys are atrophic, with numerous hypodensities compatible with cysts, stable. The bladder is decompressed which limits its evaluation. The adrenals are unremarkable. Stomach/Bowel: No bowel obstruction or ileus. Normal appendix right lower quadrant. Scattered diverticulosis without diverticulitis. Vascular/Lymphatic: Aortic atherosclerosis. No enlarged abdominal or pelvic lymph nodes. Reproductive: Calcified uterine fibroids  are again noted. No adnexal masses. Other: No free fluid or free gas.  No abdominal wall hernia. 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 No acute or destructive bony lesions. IMPRESSION: 1. Organizing retroperitoneal and intraperitoneal hematoma as above. No evidence of acute hemorrhage. 2. Bilateral renal atrophy with numerous renal hypodensities as above. No definite etiology to explain the patient's hematuria or as a cause of previous retroperitoneal hemorrhage. If further evaluation of the kidneys is desired, follow-up multiphase dedicated renal CT or MRI may be useful after resolution of the organizing hematoma described above. 3. Diverticulosis without diverticulitis. 4. Biliary sludge.  No evidence of cholelithiasis or cholecystitis. 5.  Aortic Atherosclerosis (ICD10-I70.0). Electronically Signed   By: Randa Ngo M.D.   On: 01/21/2020 01:28    Medications: . sodium chloride    . heparin 1,000 Units/hr (01/20/20 2106)   . calcitRIOL  2 mcg Oral Q M,W,F  . Chlorhexidine Gluconate Cloth  6 each Topical Q0600  . cinacalcet  180 mg Oral Q M,W,F-HD  . docusate sodium  100 mg Oral Daily  . ferric citrate  840 mg Oral TID WC  . metoprolol tartrate  25 mg Oral  BID  . multivitamin  1 tablet Oral QHS  . pantoprazole  40 mg Oral QAC breakfast  . polyethylene glycol  17 g Oral BID  . senna  1 tablet Oral Daily  . simethicone  80 mg Oral TID  . warfarin  9 mg Oral ONCE-1600  . Warfarin - Pharmacist Dosing Inpatient   Does not apply [id number]    Dialysis Orders: MWF HD  4h  61kg  450/800  2/2 bath  P2  Hep 1800  -sensipar 180mg  -calcitriol 45mcg -no esa, no iron  Assessment/Plan: 1. Retroperitoneal bleed/L perirenal bleed/hematoma -  Seen on CT 8/18.  S/p 2 units FFP, 4 units pRBC total and Vit K.  IV heparin started 8/21 and stopped 8/22, given more pRBC.  IV hep and coumadin restarted 8/23.  INR remains subtherapeutic, 1.6 today.  Seen by Urology,  if rebleeds plan to embolize L kidney.  Hgb stable in 9s.  Rpt CT Abd w/ IV contrast no clear identified renal mass, can rpt testing once hematoam resolved 2. Rectal bleeding - resolved.   3. 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 ESRD - on HD MWF.  Orders written for HD onregular schedule.  K 4.0 5. Anemia of CKD- As above  S/p 4 units pRBC.  TSAT 84% 8/30.   6. Secondary hyperparathyroidism - Phos at goal. Cont low Ca bath. . Continue VDRA and sensipar. 7. HTN/volume - BP stable, UF to 61kg 8. Nutrition - Renal diet w/fluid restrictions. Alb 2.8. Protein supplements.   [facility] 01/21/2020,11:09 AM  LOS: 15 days

## 2020-01-21 NOTE — Progress Notes (Signed)
PROGRESS NOTE    [name]   JJK:[id number]  DOB: November 05, 1960  DOA: 01/06/2020 PCP: Benito Mccreedy, MD   Brief Narrative:  [name]  is an 59 y.o. female past medical history of end-stage renal disease on hemodialysis Monday Wednesday and Friday status post aortic and mitral valve replacement on Coumadin who presents with nausea and nonbloody emesis, she was found to have a spontaneous left retroperitoneal bleed she status post 1 unit of packed red blood cells 2 units of fresh frozen plasma and vitamin K.   Subjective: She states she is urinating blood today. She only had a very small amount of urine but it is definitely bloody. No abdominal or pelvic pain and no dysuria.  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 - spontaneous hemorrhage - s/p 4 U PRBC, Vit K and 2 U FFP - has resolved and Coumadin has been resumed with Heparin bridge on 8/23 - ultrasound of abdomen reveals stable retroperitoneal bleed - INR now 1.6 - awaiting INR of 2.5- being complicated by gross hematuria- see below - CT abd/pelvis shows a stable hematoma  Active Problems: Gross hematuria - obtain CT abd./pelvis to see if a source of bleeding can be found-  - current amount of blood is small and Hb is stable but CT shows nothing new- will discuss with Dr Gloriann Loan today  End-stage renal disease on hemodialysis  Secondary hyperparathyroidism - cont HD on MWF    Time spent in minutes: 35- discussed plan with Dr Joelyn Oms DVT prophylaxis: Heparin/ Coumadin Code Status: Full code Family Communication:  Disposition Plan:  Status is: Inpatient  Remains inpatient appropriate because:cont to follow INR, continue work up on gross hematuria   Dispo: The patient is from: Home              Anticipated d/c is to: Home              Anticipated d/c date is: 3 days              Patient currently is not medically stable to  d/c.  Consultants:   Nephrology  Cardiology   Urology Procedures:     Antimicrobials:  Anti-infectives (From admission, onward)   None       Objective: Vitals:   01/20/20 1957 01/21/20 0545 01/21/20 0552 01/21/20 1037  BP: (!) 146/82 (!) 151/92  (!) 152/88  Pulse: (!) 103 96  96  Resp:  18  14  Temp:  97.9 F (36.6 C)  98.5 F (36.9 C)  TempSrc:  Oral  Oral  SpO2: 100% 100%  100%  Weight:   61.4 kg   Height:        Intake/Output Summary (Last 24 hours) at 01/21/2020 1358 Last data filed at 01/21/2020 0846 Gross per 24 hour  Intake 784.69 ml  Output 2754 ml  Net -1969.31 ml   Filed Weights   01/19/20 0501 01/20/20 1400 01/21/20 0552  Weight: 62.5 kg 64.5 kg 61.4 kg    Examination: General exam: Appears comfortable  HEENT: PERRLA, oral mucosa moist, no sclera icterus or thrush Respiratory system: Clear to auscultation. Respiratory effort normal. Cardiovascular system: S1 & S2 heard,  No murmurs  Gastrointestinal system: Abdomen soft, non-tender, nondistended. Normal bowel sounds   Central nervous system: Alert and oriented. No focal neurological deficits. Extremities: No cyanosis, clubbing or edema Skin: No rashes or ulcers Psychiatry:  Mood & affect appropriate.   Data Reviewed:  I have personally reviewed following labs and imaging studies  CBC: Recent Labs  Lab 01/17/20 0417 01/18/20 0804 01/19/20 1509 01/20/20 0551 01/21/20 0459  WBC 8.4 8.8 7.3 8.3 8.6  NEUTROABS  --   --  5.6  --   --   HGB 9.3* 9.7* 9.5* 9.6* 10.5*  HCT 28.9* 29.3* 31.1* 29.7* 32.3*  MCV 96.7 96.4 100.6* 99.0 98.8  PLT 295 315 297 309 638   Basic Metabolic Panel: Recent Labs  Lab 01/17/20 0417 01/18/20 0801 01/19/20 0710 01/20/20 0551 01/21/20 0459  NA 128* 130* 133* 130* 133*  K 3.8 4.2 3.9 4.0 3.8  CL 90* 89* 94* 93* 92*  CO2 24 23 26 24 29   GLUCOSE 86 83 81 88 92  BUN 44* 61* 22* 37* 15  CREATININE 7.04* 9.86* 5.23* 7.20* 4.59*  CALCIUM 10.0 10.0 8.8* 9.5 9.3   PHOS 4.9* 4.9* 3.5 3.6 3.3   GFR: Estimated Creatinine Clearance: 12.7 mL/min (A) (by C-G formula based on SCr of 4.59 mg/dL (H)). Liver Function Tests: Recent Labs  Lab 01/17/20 0417 01/18/20 0801 01/19/20 0710 01/20/20 0551 01/21/20 0459  ALBUMIN 2.8* 2.8* 2.8* 2.8* 2.8*   No results for input(s): LIPASE, AMYLASE in the last 168 hours. No results for input(s): AMMONIA in the last 168 hours. Coagulation Profile: Recent Labs  Lab 01/17/20 0417 01/18/20 0810 01/19/20 0710 01/20/20 0551 01/21/20 0459  INR 1.2 1.4* 1.6* 2.1* 1.6*   Cardiac Enzymes: No results for input(s): CKTOTAL, CKMB, CKMBINDEX, TROPONINI in the last 168 hours. BNP (last 3 results) No results for input(s): PROBNP in the last 8760 hours. HbA1C: No results for input(s): HGBA1C in the last 72 hours. CBG: No results for input(s): GLUCAP in the last 168 hours. Lipid Profile: No results for input(s): CHOL, HDL, LDLCALC, TRIG, CHOLHDL, LDLDIRECT in the last 72 hours. Thyroid Function Tests: No results for input(s): TSH, T4TOTAL, FREET4, T3FREE, THYROIDAB in the last 72 hours. Anemia Panel: No results for input(s): VITAMINB12, FOLATE, FERRITIN, TIBC, IRON, RETICCTPCT in the last 72 hours. Urine analysis:    Component Value Date/Time   COLORURINE YELLOW 05/01/2015 2005   APPEARANCEUR CLEAR 05/01/2015 2005   LABSPEC 1.012 05/01/2015 2005   PHURINE 8.5 (H) 05/01/2015 2005   GLUCOSEU 100 (A) 05/01/2015 2005   HGBUR SMALL (A) 05/01/2015 2005   BILIRUBINUR NEGATIVE 05/01/2015 2005   KETONESUR NEGATIVE 05/01/2015 2005   PROTEINUR 100 (A) 05/01/2015 2005   UROBILINOGEN 0.2 07/27/2014 0150   NITRITE NEGATIVE 05/01/2015 2005   LEUKOCYTESUR NEGATIVE 05/01/2015 2005   Sepsis Labs: @LABRCNTIP (procalcitonin:4,lacticidven:4) )No results found for this or any previous visit (from the past 240 hour(s)).       Radiology Studies: US Abdomen Complete  Result Date: 01/20/2020 CLINICAL DATA:  Follow-up CT EXAM:  ABDOMEN ULTRASOUND COMPLETE COMPARISON:  CT abdomen pelvis 01/06/2020 FINDINGS: Gallbladder: No gallbladder wall thickening. There is sludge and small echogenic mural foci, likely representing polyps measuring up to 0.5 cm. No definite shadowing calculi. Negative sonographic Murphy sign. Common bile duct: Diameter: 0.3 cm, within normal limits. Liver: No focal lesion identified. Within normal limits in parenchymal echogenicity. Portal vein is patent on color Doppler imaging with normal direction of blood flow towards the liver. IVC: No abnormality visualized. Pancreas: Pancreatic duct appears slightly prominent measuring 0.4 cm. Spleen: Multiple granulomas throughout.  Normal size. Right Kidney: Length: 6.6 cm cysts. Echogenicity within normal limits. There are multiple small cysts. No hydronephrosis. Left Kidney: Not visualized secondary to complex mass in the left  flank. There is a large heterogeneous mass measuring 18.2 x 7.8 x 8.3 cm, consistent with the previously identified hematoma. Abdominal aorta: No aneurysm visualized. Other findings: None. IMPRESSION: 1. Large complex mass in the left flank measuring 18.2 cm, consistent with the previously identified retroperitoneal hematoma. 2.  Atrophic right kidney with multiple small. 3. Gallbladder polyps measuring up to 0.5 cm. No dedicated follow-up is necessary. 4.  Mildly prominent main pancreatic duct. Electronically Signed   By: Audie Pinto M.D.   On: 01/20/2020 08:51   CT ABDOMEN PELVIS W CONTRAST  Result Date: 01/21/2020 CLINICAL DATA:  Recurrent retroperitoneal hemorrhage, hematuria, concern for renal mass EXAM: CT ABDOMEN AND PELVIS WITH CONTRAST TECHNIQUE: Multidetector CT imaging of the abdomen and pelvis was performed using the standard protocol following bolus administration of intravenous contrast. CONTRAST:  156mL OMNIPAQUE IOHEXOL 300 MG/ML  SOLN COMPARISON:  01/20/2020, 01/06/2020 FINDINGS: Lower chest: Hypoventilatory changes are seen at  the lung bases. No acute pleural or parenchymal lung disease. Postsurgical changes from aortic and mitral valve replacements. Hepatobiliary: Gallbladder sludge layers dependently in the gallbladder. No cholecystitis. The liver is unremarkable. Pancreas: Unremarkable. No pancreatic ductal dilatation or surrounding inflammatory changes. Spleen: Diffuse splenic calcifications unchanged. Adrenals/Urinary Tract: There has been interval organization of the retroperitoneal hemorrhage seen previously. Retroperitoneal hematoma along the ventral aspect of the left kidney measures approximately 8.0 x 8.8 cm in transverse dimension. There does appear to be and intraperitoneal extent of the organizing hematoma, contiguous with the retroperitoneal hematoma, measuring 7.7 x 6.9 cm in transverse dimension. In its entirety, the organizing hematoma extends approximately 17 cm in craniocaudal length. On this single phase exam, no underlying left renal mass can be observed. The kidneys are atrophic, with numerous hypodensities compatible with cysts, stable. The bladder is decompressed which limits its evaluation. The adrenals are unremarkable. Stomach/Bowel: No bowel obstruction or ileus. Normal appendix right lower quadrant. Scattered diverticulosis without diverticulitis. Vascular/Lymphatic: Aortic atherosclerosis. No enlarged abdominal or pelvic lymph nodes. Reproductive: Calcified uterine fibroids are again noted. No adnexal masses. Other: No free fluid or free gas.  No abdominal wall hernia. 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 No acute or destructive bony lesions. IMPRESSION: 1. Organizing retroperitoneal and intraperitoneal hematoma as above. No evidence of acute hemorrhage. 2. Bilateral renal atrophy with numerous renal hypodensities as above. No definite etiology to explain the patient's hematuria or as a cause of previous retroperitoneal hemorrhage. If further evaluation of the  kidneys is desired, follow-up multiphase dedicated renal CT or MRI may be useful after resolution of the organizing hematoma described above. 3. Diverticulosis without diverticulitis. 4. Biliary sludge.  No evidence of cholelithiasis or cholecystitis. 5.  Aortic Atherosclerosis (ICD10-I70.0). Electronically Signed   By: Randa Ngo M.D.   On: 01/21/2020 01:28      Scheduled Meds: . calcitRIOL  2 mcg Oral Q M,W,F  . Chlorhexidine Gluconate Cloth  6 each Topical Q0600  . cinacalcet  180 mg Oral Q M,W,F-HD  . docusate sodium  100 mg Oral Daily  . ferric citrate  840 mg Oral TID WC  . metoprolol tartrate  25 mg Oral BID  . multivitamin  1 tablet Oral QHS  . pantoprazole  40 mg Oral QAC breakfast  . polyethylene glycol  17 g Oral BID  . senna  1 tablet Oral Daily  . simethicone  80 mg Oral TID  . warfarin  9 mg Oral ONCE-1600  . Warfarin - Pharmacist Dosing Inpatient   Does not apply  q1600   Continuous Infusions: . sodium chloride    . heparin 1,000 Units/hr (01/20/20 2106)     LOS: 15 days      Debbe Odea, MD [facility] Pager: [email or web address] 01/21/2020, 1:58 PM

## 2020-01-21 NOTE — Progress Notes (Signed)
Physical Therapy Treatment Patient Details Name: [name] MRN: [id number] DOB: 1960-06-01 Today's Date: 01/21/2020    History of Present Illness Patient is a 59 year old female with history of ESRD on HD MWF, s/p aortic and mitral valve replacement on warfarin therapy who presented with nausea, nonbloody emesis and abdominal pain; found to have spontaneous left retroperitoneal bleed.    PT Comments    Pt admitted with above diagnosis. Pt progressing each visit.  No LOB with no nausea or dizziness today as well.   Pt currently with functional limitations due to balance and endurance deficits. Pt will benefit from skilled PT to increase their independence and safety with mobility to allow discharge to the venue listed below.     Follow Up Recommendations  No PT follow up;Supervision - Intermittent     Equipment Recommendations  None recommended by PT    Recommendations for Other Services       Precautions / Restrictions Precautions Precautions: Fall Restrictions Weight Bearing Restrictions: No    Mobility  Bed Mobility Overal bed mobility: Modified Independent             General bed mobility comments: HOB elevated, became nauseated when getting up and vomitted bile  Transfers Overall transfer level: Needs assistance Equipment used: None Transfers: Sit to/from [employer] Transfers Sit to Stand: Supervision Stand pivot transfers: Supervision       General transfer comment: supervision for lines  Ambulation/Gait Ambulation/Gait assistance: Supervision Gait Distance (Feet): 400 Feet Assistive device: None Gait Pattern/deviations: Step-through pattern;Decreased stride length Gait velocity: reduced Gait velocity interpretation: <1.31 ft/sec, indicative of household ambulator General Gait Details: Pt without LOB. Did not need device.    Stairs             Wheelchair Mobility    Modified Rankin (Stroke Patients Only)       Balance  Overall balance assessment: No apparent balance deficits (not formally assessed)                                          Cognition Arousal/Alertness: Awake/alert Behavior During Therapy: WFL for tasks assessed/performed Overall Cognitive Status: Within Functional Limits for tasks assessed                                        Exercises General Exercises - Lower Extremity Long Arc Quad: Strengthening;10 reps;AROM;Seated Heel Slides: Strengthening;10 reps Hip ABduction/ADduction: Strengthening;10 reps Hip Flexion/Marching: AROM;Both;10 reps;Seated    General Comments        Pertinent Vitals/Pain Pain Assessment: No/denies pain    Home Living                      Prior Function            PT Goals (current goals can now be found in the care plan section) Acute Rehab PT Goals Patient Stated Goal: manage her symptoms Progress towards PT goals: Progressing toward goals    Frequency    Min 2X/week      PT Plan Current plan remains appropriate    Co-evaluation              AM-PAC PT "6 Clicks" Mobility   Outcome Measure  Help needed turning from your back to your side while in  a flat bed without using bedrails?: None Help needed moving from lying on your back to sitting on the side of a flat bed without using bedrails?: None Help needed moving to and from a bed to a chair (including a wheelchair)?: None Help needed standing up from a chair using your arms (e.g., wheelchair or bedside chair)?: None Help needed to walk in hospital room?: A Little Help needed climbing 3-5 steps with a railing? : A Little 6 Click Score: 22    End of Session Equipment Utilized During Treatment: Gait belt Activity Tolerance: Patient tolerated treatment well Patient left: in bed;with call bell/phone within reach (sitting EOB) Nurse Communication: Mobility status PT Visit Diagnosis: Muscle weakness (generalized) (M62.81)     Time:  6712-4580 PT Time Calculation (min) (ACUTE ONLY): 12 min  Charges:  $Gait Training: 8-22 mins                     Nicolae Vasek W,PT [place] Pager:  [phone]  Office:  [place] 01/21/2020, 12:43 PM

## 2020-01-21 NOTE — Progress Notes (Signed)
ANTICOAGULATION CONSULT NOTE - Follow Up Consult  Pharmacy Consult for Heparin + Coumadin Indication: artificial heart valves  No Known Allergies  Patient Measurements: Height: 5\' 6"  (167.6 cm) Weight: 61.4 kg (135 lb 6.4 oz) IBW/kg (Calculated) : 59.3   Heparin dosing weight 61.5 kg   Vital Signs: Temp: 97.9 F (36.6 C) (09/02 0545) Temp Source: Oral (09/02 0545) BP: 151/92 (09/02 0545) Pulse Rate: 96 (09/02 0545)  Labs: Recent Labs    01/19/20 0710 01/19/20 1509 01/19/20 1509 01/20/20 0551 01/21/20 0459  HGB  --  9.5*   < > 9.6* 10.5*  HCT  --  31.1*  --  29.7* 32.3*  PLT  --  297  --  309 311  LABPROT 18.3*  --   --  22.5* 18.5*  INR 1.6*  --   --  2.1* 1.6*  HEPARINUNFRC 0.39  --   --  0.32 0.33  CREATININE 5.23*  --   --  7.20* 4.59*   < > = values in this interval not displayed.    Estimated Creatinine Clearance: 12.7 mL/min (A) (by C-G formula based on SCr of 4.59 mg/dL (H)).   HPI: s/p RP hematoma. Warfarin PTA for hx mechanical AVR/MVR (goal 2.5-3.5) reversed vit K 10mg  IV + 2 units FFP on 8/18.   Heparin resumed on 8/23 and resumed warfarin 8/25. Noted with [facility] on 8/26 and no further blood had been noted until 8/30 when patient reported small amount of blood in urine.  Patient reported small amt blood in urine, "dribble"  on 8/30 , 8/31, 9/1.  MD ordered abdominal US and CT.   I discussed warfarin with Dr. Wynelle Cleveland who said okay to continue warfarin.   9/1 abd U/S and CT abd: unchanged RP hemorrhage. No evidence of acute hemorrhage.  No definite etiology to explain pt's hematuria or as a cause of previous retroperitoneal hemorrhage.   PTA Warfarin: 9 mg daily except 6 mg on Mondays- last 8/18  (INR was 3.6 on admit 8/18)  Heparin level therapeutic at 0.33 on heparin rate 1000 units/hr.  INR increased to 2.1 yesterday and today INR dropped to 1.6 despite appropriate warfarin dosage.   INR is subtherapeutic as INR goal is  2.5-3.5. Hgb stable 9-10s, Baseline  ~ 8-9, pltc wnl.   Goal of Therapy:  Heparin level 0.3-0.5 units/ml  INR goal 2.5- 3.5 Monitor platelets by anticoagulation protocol: Yes   Plan:  - Continue heparin drip at 1000 units/hr - Coumadin 9 mg po x 1 - Daily HL, CBC, INR - Monitor for s/sx of bleeding - F/u w/MD for dc hep when INR =/>2.5    Thank you for involving pharmacy in this patient's care.  Nicole Cella, RPh Clinical Pharmacist [phone] **Pharmacist phone directory can be found on [email or web address] listed under [place]** 01/21/2020 9:49 AM

## 2020-01-22 LAB — RENAL FUNCTION PANEL
Albumin: 2.9 g/dL — ABNORMAL LOW (ref 3.5–5.0)
Anion gap: 13 (ref 5–15)
BUN: 33 mg/dL — ABNORMAL HIGH (ref 6–20)
CO2: 27 mmol/L (ref 22–32)
Calcium: 9.4 mg/dL (ref 8.9–10.3)
Chloride: 90 mmol/L — ABNORMAL LOW (ref 98–111)
Creatinine, Ser: 7.36 mg/dL — ABNORMAL HIGH (ref 0.44–1.00)
GFR calc Af Amer: 6 mL/min — ABNORMAL LOW (ref >60)
GFR calc non Af Amer: 6 mL/min — ABNORMAL LOW (ref >60)
Glucose, Bld: 84 mg/dL (ref 70–99)
Phosphorus: 3.9 mg/dL (ref 2.5–4.6)
Potassium: 3.9 mmol/L (ref 3.5–5.1)
Sodium: 130 mmol/L — ABNORMAL LOW (ref 135–145)

## 2020-01-22 LAB — CBC
HCT: 30.6 % — ABNORMAL LOW (ref 36.0–46.0)
Hemoglobin: 9.7 g/dL — ABNORMAL LOW (ref 12.0–15.0)
MCH: 31.1 pg (ref 26.0–34.0)
MCHC: 31.7 g/dL (ref 30.0–36.0)
MCV: 98.1 fL (ref 80.0–100.0)
Platelets: 270 10*3/uL (ref 150–400)
RBC: 3.12 MIL/uL — ABNORMAL LOW (ref 3.87–5.11)
RDW: 16.1 % — ABNORMAL HIGH (ref 11.5–15.5)
WBC: 7.3 10*3/uL (ref 4.0–10.5)
nRBC: 0 % (ref 0.0–0.2)

## 2020-01-22 LAB — HEPARIN LEVEL (UNFRACTIONATED): Heparin Unfractionated: 0.4 IU/mL (ref 0.30–0.70)

## 2020-01-22 LAB — PROTIME-INR
INR: 1.7 — ABNORMAL HIGH (ref 0.8–1.2)
Prothrombin Time: 19.2 seconds — ABNORMAL HIGH (ref 11.4–15.2)

## 2020-01-22 MED ORDER — WARFARIN SODIUM 10 MG PO TABS
10.0000 mg | ORAL_TABLET | Freq: Once | ORAL | Status: AC
  Administered 2020-01-22: 10 mg via ORAL
  Filled 2020-01-22 (×2): qty 1

## 2020-01-22 MED ORDER — CALCITRIOL 0.5 MCG PO CAPS
ORAL_CAPSULE | ORAL | Status: AC
  Filled 2020-01-22: qty 4

## 2020-01-22 MED ORDER — ACETAMINOPHEN 325 MG PO TABS
ORAL_TABLET | ORAL | Status: AC
  Filled 2020-01-22: qty 2

## 2020-01-22 MED ORDER — CALCITRIOL 0.5 MCG PO CAPS
ORAL_CAPSULE | ORAL | Status: AC
  Filled 2020-01-22: qty 1

## 2020-01-22 NOTE — Procedures (Signed)
I was present at this dialysis session. I have reviewed the session itself and made appropriate changes.   4K, 3L UF goal.  AM Labs pending. INR pending. No c/o. Uro to w/u hematuria as outpt.  Filed Weights   01/20/20 1400 01/21/20 0552 01/22/20 0457  Weight: 64.5 kg 61.4 kg 63.6 kg    Recent Labs  Lab 01/21/20 0459  NA 133*  K 3.8  CL 92*  CO2 29  GLUCOSE 92  BUN 15  CREATININE 4.59*  CALCIUM 9.3  PHOS 3.3    Recent Labs  Lab 01/19/20 1509 01/20/20 0551 01/21/20 0459  WBC 7.3 8.3 8.6  NEUTROABS 5.6  --   --   HGB 9.5* 9.6* 10.5*  HCT 31.1* 29.7* 32.3*  MCV 100.6* 99.0 98.8  PLT 297 309 311    Scheduled Meds: . acetaminophen      . calcitRIOL  2 mcg Oral Q M,W,F  . Chlorhexidine Gluconate Cloth  6 each Topical Q0600  . cinacalcet  180 mg Oral Q M,W,F-HD  . docusate sodium  100 mg Oral Daily  . ferric citrate  840 mg Oral TID WC  . metoprolol tartrate  25 mg Oral BID  . multivitamin  1 tablet Oral QHS  . pantoprazole  40 mg Oral QAC breakfast  . polyethylene glycol  17 g Oral BID  . senna  1 tablet Oral Daily  . simethicone  80 mg Oral TID  . Warfarin - Pharmacist Dosing Inpatient   Does not apply q1600   Continuous Infusions: . sodium chloride    . heparin 1,000 Units/hr (01/21/20 2317)   PRN Meds:.sodium chloride, acetaminophen, ondansetron, oxyCODONE-acetaminophen   Pearson Grippe  MD 01/22/2020, 8:46 AM

## 2020-01-22 NOTE — Progress Notes (Signed)
ANTICOAGULATION CONSULT NOTE - Follow Up Consult  Pharmacy Consult for Heparin + Coumadin Indication: artificial heart valves  No Known Allergies  Patient Measurements: Height: 5\' 6"  (167.6 cm) Weight: 63.6 kg (140 lb 3.4 oz) (scale a) IBW/kg (Calculated) : 59.3   Heparin dosing weight 61.5 kg   Vital Signs: Temp: 98.2 F (36.8 C) (09/03 0759) Temp Source: Oral (09/03 0759) BP: 129/79 (09/03 0930) Pulse Rate: 94 (09/03 0930)  Labs: Recent Labs    01/20/20 0551 01/20/20 0551 01/21/20 0459 01/22/20 0846 01/22/20 0858 01/22/20 0859  HGB 9.6*   < > 10.5* 9.7*  --   --   HCT 29.7*  --  32.3* 30.6*  --   --   PLT 309  --  311 270  --   --   LABPROT 22.5*  --  18.5*  --   --  19.2*  INR 2.1*  --  1.6*  --   --  1.7*  HEPARINUNFRC 0.32  --  0.33  --  0.40  --   CREATININE 7.20*  --  4.59* 7.36*  --   --    < > = values in this interval not displayed.    Estimated Creatinine Clearance: 7.9 mL/min (A) (by C-G formula based on SCr of 7.36 mg/dL (H)).   HPI: s/p RP hematoma. Warfarin PTA for hx mechanical AVR/MVR (goal 2.5-3.5) reversed vit K 10mg  IV + 2 units FFP on 8/18.   Heparin resumed on 8/23 and resumed warfarin 8/25. Noted with [facility] on 8/26 and no further blood had been noted until 8/30 when patient reported small amount of blood in urine, reported on 8/30 , 8/31, 9/1.  At that time,I discussed warfarin with Dr. Wynelle Cleveland on 9/1 who said okay to continue warfarin.    9/3 No further bleeding noted, confirmed with RN. 9/1 abd U/S and CT abd: unchanged RP hemorrhage. No evidence of acute hemorrhage.  No definite etiology to explain pt's hematuria or as a cause of previous retroperitoneal hemorrhage.   PTA Warfarin: 9 mg daily except 6 mg on Mondays- last 8/18  (INR was 3.6 on admit 8/18)  Today's Heparin level remains therapeutic at 0.40 on heparin rate 1000 units/hr.  INR increased 1.7, slow trend upward.    INR is subtherapeutic as INR goal is 2.5-3.5. Hgb 9.7, stable  9-10s, Baseline ~ 8-9, pltc wnl.  No further bleeding noted, confirmed with RN.  MD notes Urology to follow up hematuria outpatient.  Goal of Therapy:  Heparin level 0.3-0.5 units/ml  INR goal 2.5- 3.5 Monitor platelets by anticoagulation protocol: Yes   Plan:  - Continue heparin drip at 1000 units/hr - Coumadin 10 mg po x 1 - Daily HL, CBC, INR - Monitor for s/sx of bleeding - F/u w/MD for dc hep when INR =/>2.5    Thank you for involving pharmacy in this patient's care.  Nicole Cella, RPh Clinical Pharmacist [phone] **Pharmacist phone directory can be found on [email or web address] listed under [place]** 01/22/2020 10:16 AM

## 2020-01-22 NOTE — Progress Notes (Signed)
PROGRESS NOTE    [name]   EQA:[id number]  DOB: 07/27/1960  DOA: 01/06/2020 PCP: Benito Mccreedy, MD   Brief Narrative:  [name]  is an 59 y.o. female past medical history of end-stage renal disease on hemodialysis Monday Wednesday and Friday status post aortic and mitral valve replacement on Coumadin who presents with nausea and nonbloody emesis, she was found to have a spontaneous left retroperitoneal bleed she status post 1 unit of packed red blood cells 2 units of fresh frozen plasma and vitamin K.   Subjective: She continues to urinate small amounts of blood. She has no other complaints.   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 - spontaneous hemorrhage - s/p 4 U PRBC, Vit K and 2 U FFP - has resolved and Coumadin has been resumed with Heparin bridge on 8/23 - ultrasound of abdomen reveals stable retroperitoneal bleed - INR now 1.7 - awaiting INR of 2.5- being complicated by gross hematuria- see below - CT abd/pelvis shows a stable hematoma  Active Problems: Gross hematuria - obtain CT abd./pelvis to see if a source of bleeding can be found-  - current amount of blood is small and Hb is stable  - abd/pelvic CT shows nothing new-  - she has a follow up appointment for urology to investigate this further    End-stage renal disease on hemodialysis  Secondary hyperparathyroidism - cont HD on MWF    Time spent in minutes: 62- discussed plan with Dr Joelyn Oms DVT prophylaxis: Heparin/ Coumadin Code Status: Full code Family Communication:  Disposition Plan:  Status is: Inpatient  Remains inpatient appropriate because:cont to follow INR and discharge when therapeutic  Dispo: The patient is from: Home              Anticipated d/c is to: Home              Anticipated d/c date is: 3 days              Patient currently is not medically stable to d/c.  Consultants:    Nephrology  Cardiology   Urology Procedures:     Antimicrobials:  Anti-infectives (From admission, onward)   None       Objective: Vitals:   01/22/20 1100 01/22/20 1130 01/22/20 1200 01/22/20 1219  BP: 104/69 120/74 94/66 122/72  Pulse: 96 100 (!) 105 99  Resp:    18  Temp:    98.3 F (36.8 C)  TempSrc:    Oral  SpO2:    97%  Weight:    61.2 kg  Height:        Intake/Output Summary (Last 24 hours) at 01/22/2020 1426 Last data filed at 01/22/2020 1219 Gross per 24 hour  Intake 450 ml  Output 2700 ml  Net -2250 ml   Filed Weights   01/21/20 0552 01/22/20 0457 01/22/20 1219  Weight: 61.4 kg 63.6 kg 61.2 kg    Examination: General exam: Appears comfortable  HEENT: PERRLA, oral mucosa moist, no sclera icterus or thrush Respiratory system: Clear to auscultation. Respiratory effort normal. Cardiovascular system: S1 & S2 heard,  No murmurs  Gastrointestinal system: Abdomen soft, non-tender, nondistended. Normal bowel sounds   Central nervous system: Alert and oriented. No focal neurological deficits. Extremities: No cyanosis, clubbing or edema Skin: No rashes or ulcers Psychiatry:  Mood & affect appropriate.    Data Reviewed: I have personally reviewed following labs and imaging studies  CBC:  Recent Labs  Lab 01/18/20 0804 01/19/20 1509 01/20/20 0551 01/21/20 0459 01/22/20 0846  WBC 8.8 7.3 8.3 8.6 7.3  NEUTROABS  --  5.6  --   --   --   HGB 9.7* 9.5* 9.6* 10.5* 9.7*  HCT 29.3* 31.1* 29.7* 32.3* 30.6*  MCV 96.4 100.6* 99.0 98.8 98.1  PLT 315 297 309 311 865   Basic Metabolic Panel: Recent Labs  Lab 01/18/20 0801 01/19/20 0710 01/20/20 0551 01/21/20 0459 01/22/20 0846  NA 130* 133* 130* 133* 130*  K 4.2 3.9 4.0 3.8 3.9  CL 89* 94* 93* 92* 90*  CO2 23 26 24 29 27   GLUCOSE 83 81 88 92 84  BUN 61* 22* 37* 15 33*  CREATININE 9.86* 5.23* 7.20* 4.59* 7.36*  CALCIUM 10.0 8.8* 9.5 9.3 9.4  PHOS 4.9* 3.5 3.6 3.3 3.9   GFR: Estimated Creatinine  Clearance: 7.9 mL/min (A) (by C-G formula based on SCr of 7.36 mg/dL (H)). Liver Function Tests: Recent Labs  Lab 01/18/20 0801 01/19/20 0710 01/20/20 0551 01/21/20 0459 01/22/20 0846  ALBUMIN 2.8* 2.8* 2.8* 2.8* 2.9*   No results for input(s): LIPASE, AMYLASE in the last 168 hours. No results for input(s): AMMONIA in the last 168 hours. Coagulation Profile: Recent Labs  Lab 01/18/20 0810 01/19/20 0710 01/20/20 0551 01/21/20 0459 01/22/20 0859  INR 1.4* 1.6* 2.1* 1.6* 1.7*   Cardiac Enzymes: No results for input(s): CKTOTAL, CKMB, CKMBINDEX, TROPONINI in the last 168 hours. BNP (last 3 results) No results for input(s): PROBNP in the last 8760 hours. HbA1C: No results for input(s): HGBA1C in the last 72 hours. CBG: No results for input(s): GLUCAP in the last 168 hours. Lipid Profile: No results for input(s): CHOL, HDL, LDLCALC, TRIG, CHOLHDL, LDLDIRECT in the last 72 hours. Thyroid Function Tests: No results for input(s): TSH, T4TOTAL, FREET4, T3FREE, THYROIDAB in the last 72 hours. Anemia Panel: No results for input(s): VITAMINB12, FOLATE, FERRITIN, TIBC, IRON, RETICCTPCT in the last 72 hours. Urine analysis:    Component Value Date/Time   COLORURINE YELLOW 05/01/2015 2005   APPEARANCEUR CLEAR 05/01/2015 2005   LABSPEC 1.012 05/01/2015 2005   PHURINE 8.5 (H) 05/01/2015 2005   GLUCOSEU 100 (A) 05/01/2015 2005   HGBUR SMALL (A) 05/01/2015 2005   BILIRUBINUR NEGATIVE 05/01/2015 2005   KETONESUR NEGATIVE 05/01/2015 2005   PROTEINUR 100 (A) 05/01/2015 2005   UROBILINOGEN 0.2 07/27/2014 0150   NITRITE NEGATIVE 05/01/2015 2005   LEUKOCYTESUR NEGATIVE 05/01/2015 2005   Sepsis Labs: @LABRCNTIP (procalcitonin:4,lacticidven:4) )No results found for this or any previous visit (from the past 240 hour(s)).       Radiology Studies: CT ABDOMEN PELVIS W CONTRAST  Result Date: 01/21/2020 CLINICAL DATA:  Recurrent retroperitoneal hemorrhage, hematuria, concern for renal  mass EXAM: CT ABDOMEN AND PELVIS WITH CONTRAST TECHNIQUE: Multidetector CT imaging of the abdomen and pelvis was performed using the standard protocol following bolus administration of intravenous contrast. CONTRAST:  175mL OMNIPAQUE IOHEXOL 300 MG/ML  SOLN COMPARISON:  01/20/2020, 01/06/2020 FINDINGS: Lower chest: Hypoventilatory changes are seen at the lung bases. No acute pleural or parenchymal lung disease. Postsurgical changes from aortic and mitral valve replacements. Hepatobiliary: Gallbladder sludge layers dependently in the gallbladder. No cholecystitis. The liver is unremarkable. Pancreas: Unremarkable. No pancreatic ductal dilatation or surrounding inflammatory changes. Spleen: Diffuse splenic calcifications unchanged. Adrenals/Urinary Tract: There has been interval organization of the retroperitoneal hemorrhage seen previously. Retroperitoneal hematoma along the ventral aspect of the left kidney measures approximately 8.0 x 8.8 cm in transverse dimension.  There does appear to be and intraperitoneal extent of the organizing hematoma, contiguous with the retroperitoneal hematoma, measuring 7.7 x 6.9 cm in transverse dimension. In its entirety, the organizing hematoma extends approximately 17 cm in craniocaudal length. On this single phase exam, no underlying left renal mass can be observed. The kidneys are atrophic, with numerous hypodensities compatible with cysts, stable. The bladder is decompressed which limits its evaluation. The adrenals are unremarkable. Stomach/Bowel: No bowel obstruction or ileus. Normal appendix right lower quadrant. Scattered diverticulosis without diverticulitis. Vascular/Lymphatic: Aortic atherosclerosis. No enlarged abdominal or pelvic lymph nodes. Reproductive: Calcified uterine fibroids are again noted. No adnexal masses. Other: No free fluid or free gas.  No abdominal wall hernia. 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 No acute  or destructive bony lesions. IMPRESSION: 1. Organizing retroperitoneal and intraperitoneal hematoma as above. No evidence of acute hemorrhage. 2. Bilateral renal atrophy with numerous renal hypodensities as above. No definite etiology to explain the patient's hematuria or as a cause of previous retroperitoneal hemorrhage. If further evaluation of the kidneys is desired, follow-up multiphase dedicated renal CT or MRI may be useful after resolution of the organizing hematoma described above. 3. Diverticulosis without diverticulitis. 4. Biliary sludge.  No evidence of cholelithiasis or cholecystitis. 5.  Aortic Atherosclerosis (ICD10-I70.0). Electronically Signed   By: Randa Ngo M.D.   On: 01/21/2020 01:28      Scheduled Meds: . calcitRIOL      . calcitRIOL      . calcitRIOL  2 mcg Oral Q M,W,F  . Chlorhexidine Gluconate Cloth  6 each Topical Q0600  . cinacalcet  180 mg Oral Q M,W,F-HD  . docusate sodium  100 mg Oral Daily  . ferric citrate  840 mg Oral TID WC  . metoprolol tartrate  25 mg Oral BID  . multivitamin  1 tablet Oral QHS  . pantoprazole  40 mg Oral QAC breakfast  . polyethylene glycol  17 g Oral BID  . senna  1 tablet Oral Daily  . simethicone  80 mg Oral TID  . warfarin  10 mg Oral ONCE-1600  . Warfarin - Pharmacist Dosing Inpatient   Does not apply q1600   Continuous Infusions: . sodium chloride    . heparin 1,000 Units/hr (01/21/20 2317)     LOS: 16 days      Debbe Odea, MD [facility] Pager: [email or web address] 01/22/2020, 2:26 PM

## 2020-01-23 LAB — RENAL FUNCTION PANEL
Albumin: 3.2 g/dL — ABNORMAL LOW (ref 3.5–5.0)
Anion gap: 13 (ref 5–15)
BUN: 26 mg/dL — ABNORMAL HIGH (ref 6–20)
CO2: 25 mmol/L (ref 22–32)
Calcium: 10.2 mg/dL (ref 8.9–10.3)
Chloride: 93 mmol/L — ABNORMAL LOW (ref 98–111)
Creatinine, Ser: 5.61 mg/dL — ABNORMAL HIGH (ref 0.44–1.00)
GFR calc Af Amer: 9 mL/min — ABNORMAL LOW (ref >60)
GFR calc non Af Amer: 8 mL/min — ABNORMAL LOW (ref >60)
Glucose, Bld: 113 mg/dL — ABNORMAL HIGH (ref 70–99)
Phosphorus: 3.4 mg/dL (ref 2.5–4.6)
Potassium: 4 mmol/L (ref 3.5–5.1)
Sodium: 131 mmol/L — ABNORMAL LOW (ref 135–145)

## 2020-01-23 LAB — CBC
HCT: 34.1 % — ABNORMAL LOW (ref 36.0–46.0)
Hemoglobin: 10.7 g/dL — ABNORMAL LOW (ref 12.0–15.0)
MCH: 31.1 pg (ref 26.0–34.0)
MCHC: 31.4 g/dL (ref 30.0–36.0)
MCV: 99.1 fL (ref 80.0–100.0)
Platelets: 275 10*3/uL (ref 150–400)
RBC: 3.44 MIL/uL — ABNORMAL LOW (ref 3.87–5.11)
RDW: 16.2 % — ABNORMAL HIGH (ref 11.5–15.5)
WBC: 7 10*3/uL (ref 4.0–10.5)
nRBC: 0 % (ref 0.0–0.2)

## 2020-01-23 LAB — PROTIME-INR
INR: 1.6 — ABNORMAL HIGH (ref 0.8–1.2)
Prothrombin Time: 18.2 seconds — ABNORMAL HIGH (ref 11.4–15.2)

## 2020-01-23 LAB — HEPARIN LEVEL (UNFRACTIONATED): Heparin Unfractionated: 0.45 IU/mL (ref 0.30–0.70)

## 2020-01-23 MED ORDER — WARFARIN SODIUM 10 MG PO TABS
10.0000 mg | ORAL_TABLET | Freq: Once | ORAL | Status: AC
  Administered 2020-01-23: 10 mg via ORAL
  Filled 2020-01-23: qty 1

## 2020-01-23 NOTE — Progress Notes (Signed)
PROGRESS NOTE    [name]   IRJ:[id number]  DOB: 12-Dec-1960  DOA: 01/06/2020 PCP: Benito Mccreedy, MD   Brief Narrative:  [name]  is an 59 y.o. female past medical history of end-stage renal disease on hemodialysis Monday Wednesday and Friday status post aortic and mitral valve replacement on Coumadin who presents with nausea and nonbloody emesis, she was found to have a spontaneous left retroperitoneal bleed she status post 1 unit of packed red blood cells 2 units of fresh frozen plasma and vitamin K.   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 - spontaneous hemorrhage - s/p 4 U PRBC, Vit K and 2 U FFP - has resolved and Coumadin has been resumed with Heparin bridge on 8/23 - ultrasound of abdomen reveals stable retroperitoneal bleed - INR now 1.6 - awaiting INR of 2.5- being complicated by gross hematuria- see below - CT abd/pelvis shows a stable hematoma  Active Problems: Gross hematuria - obtain CT abd./pelvis to see if a source of bleeding can be found-  - current amount of blood is small and Hb is stable  - abd/pelvic CT shows nothing new-  - she has a follow up appointment for urology to investigate this further on Tuesday    End-stage renal disease on hemodialysis  Secondary hyperparathyroidism - cont HD on MWF    Time spent in minutes: 71- discussed plan with Dr Joelyn Oms DVT prophylaxis: Heparin/ Coumadin Code Status: Full code Family Communication:  Disposition Plan:  Status is: Inpatient  Remains inpatient appropriate because:cont to follow INR and discharge when therapeutic  Dispo: The patient is from: Home              Anticipated d/c is to: Home              Anticipated d/c date is: 3 days              Patient currently is not medically stable to d/c.  Consultants:   Nephrology  Cardiology   Urology Procedures:       Antimicrobials:  Anti-infectives (From admission, onward)   None       Objective: Vitals:   01/23/20 0104 01/23/20 0326 01/23/20 0800 01/23/20 1200  BP: (!) 141/81 (!) 156/92 (!) 148/86 (!) 154/97  Pulse: 96 94 97 100  Resp: 19 19 19 18   Temp: 98.6 F (37 C) 98 F (36.7 C) 98.7 F (37.1 C) 98 F (36.7 C)  TempSrc: Oral Oral Skin Skin  SpO2: 100% 99% 100% 100%  Weight:  62.1 kg    Height:        Intake/Output Summary (Last 24 hours) at 01/23/2020 1359 Last data filed at 01/23/2020 0759 Gross per 24 hour  Intake 1060 ml  Output --  Net 1060 ml   Filed Weights   01/22/20 0457 01/22/20 1219 01/23/20 0326  Weight: 63.6 kg 61.2 kg 62.1 kg    Examination: General exam: Appears comfortable  HEENT: PERRLA, oral mucosa moist, no sclera icterus or thrush Respiratory system: Clear to auscultation. Respiratory effort normal. Cardiovascular system: S1 & S2 heard,  No murmurs  Gastrointestinal system: Abdomen soft, non-tender, nondistended. Normal bowel sounds   Central nervous system: Alert and oriented. No focal neurological deficits. Extremities: No cyanosis, clubbing or edema Skin: No rashes or ulcers Psychiatry:  Mood & affect appropriate.   Data Reviewed: I have personally reviewed following labs and imaging studies  CBC: Recent Labs  Lab 01/19/20 1509 01/20/20 0551 01/21/20 0459 01/22/20 0846 01/23/20 0800  WBC 7.3 8.3 8.6 7.3 7.0  NEUTROABS 5.6  --   --   --   --   HGB 9.5* 9.6* 10.5* 9.7* 10.7*  HCT 31.1* 29.7* 32.3* 30.6* 34.1*  MCV 100.6* 99.0 98.8 98.1 99.1  PLT 297 309 311 270 259   Basic Metabolic Panel: Recent Labs  Lab 01/19/20 0710 01/20/20 0551 01/21/20 0459 01/22/20 0846 01/23/20 0800  NA 133* 130* 133* 130* 131*  K 3.9 4.0 3.8 3.9 4.0  CL 94* 93* 92* 90* 93*  CO2 26 24 29 27 25   GLUCOSE 81 88 92 84 113*  BUN 22* 37* 15 33* 26*  CREATININE 5.23* 7.20* 4.59* 7.36* 5.61*  CALCIUM 8.8* 9.5 9.3 9.4 10.2  PHOS 3.5 3.6 3.3 3.9 3.4    GFR: Estimated Creatinine Clearance: 10.4 mL/min (A) (by C-G formula based on SCr of 5.61 mg/dL (H)). Liver Function Tests: Recent Labs  Lab 01/19/20 0710 01/20/20 0551 01/21/20 0459 01/22/20 0846 01/23/20 0800  ALBUMIN 2.8* 2.8* 2.8* 2.9* 3.2*   No results for input(s): LIPASE, AMYLASE in the last 168 hours. No results for input(s): AMMONIA in the last 168 hours. Coagulation Profile: Recent Labs  Lab 01/19/20 0710 01/20/20 0551 01/21/20 0459 01/22/20 0859 01/23/20 0800  INR 1.6* 2.1* 1.6* 1.7* 1.6*   Cardiac Enzymes: No results for input(s): CKTOTAL, CKMB, CKMBINDEX, TROPONINI in the last 168 hours. BNP (last 3 results) No results for input(s): PROBNP in the last 8760 hours. HbA1C: No results for input(s): HGBA1C in the last 72 hours. CBG: No results for input(s): GLUCAP in the last 168 hours. Lipid Profile: No results for input(s): CHOL, HDL, LDLCALC, TRIG, CHOLHDL, LDLDIRECT in the last 72 hours. Thyroid Function Tests: No results for input(s): TSH, T4TOTAL, FREET4, T3FREE, THYROIDAB in the last 72 hours. Anemia Panel: No results for input(s): VITAMINB12, FOLATE, FERRITIN, TIBC, IRON, RETICCTPCT in the last 72 hours. Urine analysis:    Component Value Date/Time   COLORURINE YELLOW 05/01/2015 2005   APPEARANCEUR CLEAR 05/01/2015 2005   LABSPEC 1.012 05/01/2015 2005   PHURINE 8.5 (H) 05/01/2015 2005   GLUCOSEU 100 (A) 05/01/2015 2005   HGBUR SMALL (A) 05/01/2015 2005   BILIRUBINUR NEGATIVE 05/01/2015 2005   KETONESUR NEGATIVE 05/01/2015 2005   PROTEINUR 100 (A) 05/01/2015 2005   UROBILINOGEN 0.2 07/27/2014 0150   NITRITE NEGATIVE 05/01/2015 2005   LEUKOCYTESUR NEGATIVE 05/01/2015 2005   Sepsis Labs: @LABRCNTIP (procalcitonin:4,lacticidven:4) )No results found for this or any previous visit (from the past 240 hour(s)).       Radiology Studies: No results found.    Scheduled Meds:  calcitRIOL  2 mcg Oral Q M,W,F   Chlorhexidine Gluconate Cloth   6 each Topical Q0600   cinacalcet  180 mg Oral Q M,W,F-HD   docusate sodium  100 mg Oral Daily   ferric citrate  840 mg Oral TID WC   metoprolol tartrate  25 mg Oral BID   multivitamin  1 tablet Oral QHS   pantoprazole  40 mg Oral QAC breakfast   polyethylene glycol  17 g Oral BID   senna  1 tablet Oral Daily   simethicone  80 mg Oral TID   warfarin  10 mg Oral ONCE-1600   Warfarin - Pharmacist Dosing Inpatient   Does not apply q1600   Continuous Infusions:  sodium chloride     heparin 1,000 Units/hr (01/22/20 2227)     LOS:  17 days      Debbe Odea, MD [facility] Pager: [email or web address] 01/23/2020, 1:59 PM

## 2020-01-23 NOTE — Progress Notes (Signed)
ANTICOAGULATION CONSULT NOTE - Follow Up Consult  Pharmacy Consult for Heparin + Coumadin Indication: artificial heart valves  No Known Allergies  Patient Measurements: Height: 5\' 6"  (167.6 cm) Weight: 62.1 kg (136 lb 12.8 oz) IBW/kg (Calculated) : 59.3   Heparin dosing weight 61.5 kg   Vital Signs: Temp: 98.7 F (37.1 C) (09/04 0800) Temp Source: Skin (09/04 0800) BP: 148/86 (09/04 0800) Pulse Rate: 97 (09/04 0800)  Labs: Recent Labs    01/21/20 0459 01/21/20 0459 01/22/20 0846 01/22/20 0858 01/22/20 0859 01/23/20 0800  HGB 10.5*   < > 9.7*  --   --  10.7*  HCT 32.3*  --  30.6*  --   --  34.1*  PLT 311  --  270  --   --  275  LABPROT 18.5*  --   --   --  19.2* 18.2*  INR 1.6*  --   --   --  1.7* 1.6*  HEPARINUNFRC 0.33  --   --  0.40  --  0.45  CREATININE 4.59*  --  7.36*  --   --  5.61*   < > = values in this interval not displayed.    Estimated Creatinine Clearance: 10.4 mL/min (A) (by C-G formula based on SCr of 5.61 mg/dL (H)).   HPI: s/p RP hematoma. Warfarin PTA for hx mechanical AVR/MVR (goal 2.5-3.5) reversed vit K 10mg  IV + 2 units FFP on 8/18.   Heparin resumed on 8/23 and resumed warfarin 8/25. Noted with [facility] on 8/26 and no further blood had been noted until 8/30 when patient reported small amount of blood in urine, reported on 8/30 , 8/31, 9/1.  At that time,I discussed warfarin with Dr. Wynelle Cleveland on 9/1 who said okay to continue warfarin.    9/3 No further bleeding noted, confirmed with RN. 9/1 abd U/S and CT abd: unchanged RP hemorrhage. No evidence of acute hemorrhage.  No definite etiology to explain pt's hematuria or as a cause of previous retroperitoneal hemorrhage.   PTA Warfarin: 9 mg daily except 6 mg on Mondays- last 8/18  (INR was 3.6 on admit 8/18)  Today's Heparin level remains therapeutic at 0.45 on heparin rate 1000 units/hr.  INR continues to be subtherapeutic at 1.6, most likely still residual effects of vitamin K and FFP.  CBC  stable. No further bleeding noted, confirmed with RN.  MD notes Urology to follow up hematuria outpatient.  Goal of Therapy:  Heparin level 0.3-0.5 units/ml  INR goal 2.5- 3.5 Monitor platelets by anticoagulation protocol: Yes   Plan:  - Continue heparin drip at 1000 units/hr - Coumadin 10 mg po x 1 - Daily HL, CBC, INR - Monitor for s/sx of bleeding - F/u w/MD for dc hep when INR =/>2.5    Thank you for involving pharmacy in this patient's care.  Romilda Garret, PharmD PGY1 Acute Care Pharmacy Resident Phone: [phone] 01/23/2020 12:18 PM  Please check [email or web address] for unit specific pharmacy phone numbers.

## 2020-01-23 NOTE — Progress Notes (Signed)
[facility] Progress Note  Subjective: doing well, awaiting today's labs  Vitals:   01/23/20 0104 01/23/20 0326 01/23/20 0800 01/23/20 1200  BP: (!) 141/81 (!) 156/92 (!) 148/86 (!) 154/97  Pulse: 96 94 97 100  Resp: 19 19 19 18   Temp: 98.6 F (37 C) 98 F (36.7 C) 98.7 F (37.1 C) 98 F (36.7 C)  TempSrc: Oral Oral Skin Skin  SpO2: 100% 99% 100% 100%  Weight:  62.1 kg    Height:        Exam: General:WDWN, thin female in NAD  Heart:RRR +7/5 systolic murmur, +mechanical heart sounds Lungs:CTAB Abdomen:soft, NTND Extremities:no LE edema Dialysis Access: RU AVF +b     OP HD: MWF    4h  61kg  450/800   2/2 bath  P2  Hep 1800  -sensipar 180mg   -calcitriol 89mcg  -no esa, no iron   Assessment/ Plan: 1. L retroperitoneal perirenal bleed / hematoma -  Seen on CT 8/18.  S/p 2 units FFP, 4 units pRBC total and Vit K.  IV heparin started 8/21 and stopped 8/22, given more pRBC.  IV hep and coumadin restarted 8/23.  INR remains subtherapeutic, 1.6 today.  Seen by Urology, if rebleeds plan is to embolize L kidney.  Hgb stable in 9s.  Rpt CT Abd w/ IV contrast no clear identified renal mass, can rpt testing once hematoma resolved 2. Rectal bleeding - resolved.   3. 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 ESRD - on HD MWF.  Next HD Monday.  5. Anemia of CKD- As above  S/p 4 units pRBC.  TSAT 84% 8/30.   6. Secondary hyperparathyroidism - Phos at goal. Cont low Ca bath. . Continue VDRA and sensipar. 7. HTN/volume - BP stable, UF to 61kg 8. Nutrition - Renal diet w/fluid restrictions. Alb 2.8. Protein supplements.       [name] 01/23/2020, 12:53 PM   Recent Labs  Lab 01/22/20 0846 01/23/20 0800  K 3.9 4.0  BUN 33* 26*  CREATININE 7.36* 5.61*  CALCIUM 9.4 10.2  PHOS 3.9 3.4  HGB 9.7* 10.7*   Inpatient medications: . calcitRIOL  2 mcg Oral Q M,W,F  . Chlorhexidine Gluconate Cloth  6 each Topical Q0600  . cinacalcet  180 mg Oral Q  M,W,F-HD  . docusate sodium  100 mg Oral Daily  . ferric citrate  840 mg Oral TID WC  . metoprolol tartrate  25 mg Oral BID  . multivitamin  1 tablet Oral QHS  . pantoprazole  40 mg Oral QAC breakfast  . polyethylene glycol  17 g Oral BID  . senna  1 tablet Oral Daily  . simethicone  80 mg Oral TID  . warfarin  10 mg Oral ONCE-1600  . Warfarin - Pharmacist Dosing Inpatient   Does not apply q1600   . sodium chloride    . heparin 1,000 Units/hr (01/22/20 2227)   sodium chloride, acetaminophen, ondansetron, oxyCODONE-acetaminophen

## 2020-01-24 LAB — HEPARIN LEVEL (UNFRACTIONATED): Heparin Unfractionated: 0.38 IU/mL (ref 0.30–0.70)

## 2020-01-24 LAB — RENAL FUNCTION PANEL
Albumin: 2.8 g/dL — ABNORMAL LOW (ref 3.5–5.0)
Anion gap: 12 (ref 5–15)
BUN: 42 mg/dL — ABNORMAL HIGH (ref 6–20)
CO2: 25 mmol/L (ref 22–32)
Calcium: 9.9 mg/dL (ref 8.9–10.3)
Chloride: 94 mmol/L — ABNORMAL LOW (ref 98–111)
Creatinine, Ser: 7.28 mg/dL — ABNORMAL HIGH (ref 0.44–1.00)
GFR calc Af Amer: 7 mL/min — ABNORMAL LOW (ref >60)
GFR calc non Af Amer: 6 mL/min — ABNORMAL LOW (ref >60)
Glucose, Bld: 80 mg/dL (ref 70–99)
Phosphorus: 3.4 mg/dL (ref 2.5–4.6)
Potassium: 4.3 mmol/L (ref 3.5–5.1)
Sodium: 131 mmol/L — ABNORMAL LOW (ref 135–145)

## 2020-01-24 LAB — CBC
HCT: 30 % — ABNORMAL LOW (ref 36.0–46.0)
Hemoglobin: 9.5 g/dL — ABNORMAL LOW (ref 12.0–15.0)
MCH: 31.3 pg (ref 26.0–34.0)
MCHC: 31.7 g/dL (ref 30.0–36.0)
MCV: 98.7 fL (ref 80.0–100.0)
Platelets: 236 10*3/uL (ref 150–400)
RBC: 3.04 MIL/uL — ABNORMAL LOW (ref 3.87–5.11)
RDW: 16 % — ABNORMAL HIGH (ref 11.5–15.5)
WBC: 6.6 10*3/uL (ref 4.0–10.5)
nRBC: 0 % (ref 0.0–0.2)

## 2020-01-24 LAB — PROTIME-INR
INR: 1.7 — ABNORMAL HIGH (ref 0.8–1.2)
Prothrombin Time: 19.7 seconds — ABNORMAL HIGH (ref 11.4–15.2)

## 2020-01-24 MED ORDER — HYDRALAZINE HCL 20 MG/ML IJ SOLN
10.0000 mg | Freq: Four times a day (QID) | INTRAMUSCULAR | Status: DC | PRN
  Administered 2020-01-24: 10 mg via INTRAVENOUS

## 2020-01-24 MED ORDER — FERRIC CITRATE 1 GM 210 MG(FE) PO TABS
420.0000 mg | ORAL_TABLET | Freq: Three times a day (TID) | ORAL | Status: DC
  Administered 2020-01-28 – 2020-02-04 (×20): 420 mg via ORAL
  Filled 2020-01-24 (×23): qty 2

## 2020-01-24 MED ORDER — OXYCODONE HCL 5 MG PO TABS
10.0000 mg | ORAL_TABLET | ORAL | Status: DC | PRN
  Administered 2020-01-24 – 2020-02-11 (×28): 10 mg via ORAL
  Filled 2020-01-24 (×29): qty 2

## 2020-01-24 MED ORDER — FERRIC CITRATE 1 GM 210 MG(FE) PO TABS: 630.0000 mg | ORAL_TABLET | Freq: Three times a day (TID) | ORAL | Status: DC

## 2020-01-24 MED ORDER — OXYCODONE HCL 5 MG PO TABS: 5.0000 mg | ORAL_TABLET | ORAL | Status: DC | PRN

## 2020-01-24 MED ORDER — HYDRALAZINE HCL 20 MG/ML IJ SOLN
INTRAMUSCULAR | Status: AC
  Filled 2020-01-24: qty 1

## 2020-01-24 MED ORDER — HYDROMORPHONE HCL 1 MG/ML IJ SOLN
1.0000 mg | INTRAMUSCULAR | Status: DC | PRN
  Administered 2020-01-24 – 2020-01-26 (×11): 1 mg via INTRAVENOUS
  Filled 2020-01-24 (×10): qty 1

## 2020-01-24 MED ORDER — WARFARIN SODIUM 10 MG PO TABS
10.0000 mg | ORAL_TABLET | Freq: Once | ORAL | Status: AC
  Administered 2020-01-24: 10 mg via ORAL
  Filled 2020-01-24: qty 1

## 2020-01-24 MED ORDER — ONDANSETRON HCL 4 MG/2ML IJ SOLN
4.0000 mg | Freq: Four times a day (QID) | INTRAMUSCULAR | Status: DC | PRN
  Administered 2020-01-24 – 2020-01-26 (×5): 4 mg via INTRAVENOUS
  Filled 2020-01-24 (×5): qty 2

## 2020-01-24 NOTE — Progress Notes (Addendum)
PROGRESS NOTE    [name]   KPT:[id number]  DOB: Sep 29, 1960  DOA: 01/06/2020 PCP: Benito Mccreedy, MD   Brief Narrative:  [name]  is an 59 y.o. female past medical history of end-stage renal disease on hemodialysis Monday Wednesday and Friday status post aortic and mitral valve replacement on Coumadin who presents with nausea and nonbloody emesis, she was found to have a spontaneous left retroperitoneal bleed she status post 1 unit of packed red blood cells 2 units of fresh frozen plasma and vitamin K.   Subjective: She is having severe left sided flank pain since waking up this AM.   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 - spontaneous hemorrhage - s/p 4 U PRBC, Vit K and 2 U FFP - has resolved and Coumadin has been resumed with Heparin bridge on 8/23 - ultrasound of abdomen reveals stable retroperitoneal bleed - INR now 1.6 - awaiting INR of 2.5- being complicated by gross hematuria- see below - 9/2> CT abd/pelvis shows a stable hematoma - patient is having recurrent left flank pain now which she states feels similar to the pain she had when she was found to have the RP hemorrhage - will follow Hb Q 12 >>  if dropping, will need to repeat CT abd/pelvis to look for re-bleeding - I have ordered Dilaudid IV for her pain and increased her Oxycodone   Active Problems: Gross hematuria - obtain CT abd./pelvis to see if a source of bleeding can be found-  - current amount of blood is small and Hb is stable  - abd/pelvic CT shows nothing new-  - she has a follow up appointment for urology to investigate this further on Tuesday    End-stage renal disease on hemodialysis  Secondary hyperparathyroidism - cont HD on MWF    Time spent in minutes: 70- discussed plan with Dr Joelyn Oms DVT prophylaxis: Heparin/ Coumadin Code Status: Full code Family Communication:  Disposition Plan:  Status is:  Inpatient  Remains inpatient appropriate because:cont to follow INR and discharge when therapeutic  Dispo: The patient is from: Home              Anticipated d/c is to: Home              Anticipated d/c date is: 3 days              Patient currently is not medically stable to d/c.  Consultants:   Nephrology  Cardiology   Urology Procedures:     Antimicrobials:  Anti-infectives (From admission, onward)   None       Objective: Vitals:   01/24/20 0451 01/24/20 0835 01/24/20 1059 01/24/20 1312  BP: (!) 160/99 (!) 194/93 (!) 206/98 (!) 190/122  Pulse: 91 100 92   Resp: 19     Temp: 98.2 F (36.8 C)     TempSrc: Oral     SpO2: 96% 99%    Weight:      Height:        Intake/Output Summary (Last 24 hours) at 01/24/2020 1415 Last data filed at 01/24/2020 0400 Gross per 24 hour  Intake 681.65 ml  Output --  Net 681.65 ml   Filed Weights   01/22/20 1219 01/23/20 0326 01/24/20 0111  Weight: 61.2 kg 62.1 kg 63.6 kg    Examination: General exam: Appears comfortable  HEENT: PERRLA, oral mucosa moist, no sclera icterus or thrush Respiratory system: Clear to auscultation. Respiratory  effort normal. Cardiovascular system: S1 & S2 heard,  No murmurs  Gastrointestinal system: Abdomen soft, non-tender, nondistended. Normal bowel sounds   Central nervous system: Alert and oriented. No focal neurological deficits. Extremities: No cyanosis, clubbing or edema Skin: No rashes or ulcers Psychiatry:  Mood & affect appropriate.   Data Reviewed: I have personally reviewed following labs and imaging studies  CBC: Recent Labs  Lab 01/19/20 1509 01/19/20 1509 01/20/20 0551 01/21/20 0459 01/22/20 0846 01/23/20 0800 01/24/20 0614  WBC 7.3   < > 8.3 8.6 7.3 7.0 6.6  NEUTROABS 5.6  --   --   --   --   --   --   HGB 9.5*   < > 9.6* 10.5* 9.7* 10.7* 9.5*  HCT 31.1*   < > 29.7* 32.3* 30.6* 34.1* 30.0*  MCV 100.6*   < > 99.0 98.8 98.1 99.1 98.7  PLT 297   < > 309 311 270 275 236    < > = values in this interval not displayed.   Basic Metabolic Panel: Recent Labs  Lab 01/20/20 0551 01/21/20 0459 01/22/20 0846 01/23/20 0800 01/24/20 0614  NA 130* 133* 130* 131* 131*  K 4.0 3.8 3.9 4.0 4.3  CL 93* 92* 90* 93* 94*  CO2 24 29 27 25 25   GLUCOSE 88 92 84 113* 80  BUN 37* 15 33* 26* 42*  CREATININE 7.20* 4.59* 7.36* 5.61* 7.28*  CALCIUM 9.5 9.3 9.4 10.2 9.9  PHOS 3.6 3.3 3.9 3.4 3.4   GFR: Estimated Creatinine Clearance: 8 mL/min (A) (by C-G formula based on SCr of 7.28 mg/dL (H)). Liver Function Tests: Recent Labs  Lab 01/20/20 0551 01/21/20 0459 01/22/20 0846 01/23/20 0800 01/24/20 0614  ALBUMIN 2.8* 2.8* 2.9* 3.2* 2.8*   No results for input(s): LIPASE, AMYLASE in the last 168 hours. No results for input(s): AMMONIA in the last 168 hours. Coagulation Profile: Recent Labs  Lab 01/20/20 0551 01/21/20 0459 01/22/20 0859 01/23/20 0800 01/24/20 0614  INR 2.1* 1.6* 1.7* 1.6* 1.7*   Cardiac Enzymes: No results for input(s): CKTOTAL, CKMB, CKMBINDEX, TROPONINI in the last 168 hours. BNP (last 3 results) No results for input(s): PROBNP in the last 8760 hours. HbA1C: No results for input(s): HGBA1C in the last 72 hours. CBG: No results for input(s): GLUCAP in the last 168 hours. Lipid Profile: No results for input(s): CHOL, HDL, LDLCALC, TRIG, CHOLHDL, LDLDIRECT in the last 72 hours. Thyroid Function Tests: No results for input(s): TSH, T4TOTAL, FREET4, T3FREE, THYROIDAB in the last 72 hours. Anemia Panel: No results for input(s): VITAMINB12, FOLATE, FERRITIN, TIBC, IRON, RETICCTPCT in the last 72 hours. Urine analysis:    Component Value Date/Time   COLORURINE YELLOW 05/01/2015 2005   APPEARANCEUR CLEAR 05/01/2015 2005   LABSPEC 1.012 05/01/2015 2005   PHURINE 8.5 (H) 05/01/2015 2005   GLUCOSEU 100 (A) 05/01/2015 2005   HGBUR SMALL (A) 05/01/2015 2005   BILIRUBINUR NEGATIVE 05/01/2015 2005   KETONESUR NEGATIVE 05/01/2015 2005   PROTEINUR  100 (A) 05/01/2015 2005   UROBILINOGEN 0.2 07/27/2014 0150   NITRITE NEGATIVE 05/01/2015 2005   LEUKOCYTESUR NEGATIVE 05/01/2015 2005   Sepsis Labs: @LABRCNTIP (procalcitonin:4,lacticidven:4) )No results found for this or any previous visit (from the past 240 hour(s)).       Radiology Studies: No results found.    Scheduled Meds: . calcitRIOL  2 mcg Oral Q M,W,F  . Chlorhexidine Gluconate Cloth  6 each Topical Q0600  . cinacalcet  180 mg Oral Q M,W,F-HD  .  docusate sodium  100 mg Oral Daily  . ferric citrate  420 mg Oral TID WC  . hydrALAZINE      . metoprolol tartrate  25 mg Oral BID  . multivitamin  1 tablet Oral QHS  . pantoprazole  40 mg Oral QAC breakfast  . polyethylene glycol  17 g Oral BID  . senna  1 tablet Oral Daily  . simethicone  80 mg Oral TID  . warfarin  10 mg Oral ONCE-1600  . Warfarin - Pharmacist Dosing Inpatient   Does not apply q1600   Continuous Infusions: . sodium chloride    . heparin 1,000 Units/hr (01/23/20 2320)     LOS: 18 days      Debbe Odea, MD [facility] Pager: [email or web address] 01/24/2020, 2:15 PM

## 2020-01-24 NOTE — Progress Notes (Signed)
ANTICOAGULATION CONSULT NOTE - Follow Up Consult  Pharmacy Consult for Heparin + Coumadin Indication: artificial heart valves  No Known Allergies  Patient Measurements: Height: 5\' 6"  (167.6 cm) Weight: 63.6 kg (140 lb 3.4 oz) IBW/kg (Calculated) : 59.3   Heparin dosing weight 61.5 kg   Vital Signs: Temp: 98.2 F (36.8 C) (09/05 0451) Temp Source: Oral (09/05 0451) BP: 160/99 (09/05 0451) Pulse Rate: 91 (09/05 0451)  Labs: Recent Labs    01/22/20 0846 01/22/20 0846 01/22/20 0858 01/22/20 0859 01/23/20 0800 01/24/20 0614  HGB 9.7*   < >  --   --  10.7* 9.5*  HCT 30.6*  --   --   --  34.1* 30.0*  PLT 270  --   --   --  275 236  LABPROT  --   --   --  19.2* 18.2* 19.7*  INR  --   --   --  1.7* 1.6* 1.7*  HEPARINUNFRC  --   --  0.40  --  0.45 0.38  CREATININE 7.36*  --   --   --  5.61* 7.28*   < > = values in this interval not displayed.    Estimated Creatinine Clearance: 8 mL/min (A) (by C-G formula based on SCr of 7.28 mg/dL (H)).   HPI: s/p RP hematoma. Warfarin PTA for hx mechanical AVR/MVR (goal 2.5-3.5) reversed vit K 10mg  IV + 2 units FFP on 8/18. PTA Warfarin: 9 mg daily except 6 mg on Mondays- last 8/18. (INR was 3.6 on admit 8/18)  Patient's heparin level continues to be therapeutic at 0.38 on heparin rate 1000 units/hr.  INR continues to be subtherapeutic at 1.7, most likely still residual effects of vitamin K and FFP.   CBC stable. CT abdomen revealed no evidence of acute hemorrhage nor evidence for definite cause of hematuria. Patient to follow up with urology for hematuria outpatient.  Goal of Therapy:  Heparin level 0.3-0.5 units/ml  INR goal 2.5- 3.5 Monitor platelets by anticoagulation protocol: Yes   Plan:  - Continue heparin drip at 1000 units/hr - Coumadin 10 mg po x 1 - Daily HL, CBC, INR - Monitor for s/sx of bleeding - F/u w/MD for dc hep when INR =/>2.5    Thank you for involving pharmacy in this patient's care.  Romilda Garret,  PharmD PGY1 Acute Care Pharmacy Resident Phone: [phone] 01/24/2020 8:18 AM  Please check [email or web address] for unit specific pharmacy phone numbers.

## 2020-01-24 NOTE — Progress Notes (Signed)
Pt complaining of L sided flank pain, nausea and vomiting at shift change, brown emesis noted. Pt unable to take PO zofran. Paged MD. Ordered PRN IV zofran given. PRN dilaudid given.  Pt states she feels better. VS as follows. No complains at this time. Will continue to monitor.      01/24/20 2012  Vitals  Temp (!) 97.5 F (36.4 C)  Temp Source Oral  BP (!) 167/83  MAP (mmHg) 107  BP Location Left Arm  BP Method Automatic  Patient Position (if appropriate) Lying  Pulse Rate (!) 109  Pulse Rate Source Monitor  Resp 20  MEWS COLOR  MEWS Score Color Green  Oxygen Therapy  SpO2 100 %  O2 Device Room Air

## 2020-01-24 NOTE — Progress Notes (Addendum)
Subjective: Complaining of left flank pain asking for stronger pain medicine RN in room calling admit team  Objective Vital signs in last 24 hours: Vitals:   01/24/20 0111 01/24/20 0451 01/24/20 0835 01/24/20 1059  BP: (!) 155/91 (!) 160/99 (!) 194/93 (!) 206/98  Pulse: 91 91 100 92  Resp: 16 19    Temp: 98.7 F (37.1 C) 98.2 F (36.8 C)    TempSrc: Oral Oral    SpO2: 99% 96% 99%   Weight: 63.6 kg     Height:       Weight change: 2.4 kg  Physical Exam: General: Thin female complaining of pain but not in significant distress Heart: RRR, 2/6 SEM, mechanical heart sound Lungs: CTA bilaterally nonlabored breathing Abdomen: Bowel sounds positive, nondistended, tender left flank Extremities: No lower extremity edema Dialysis Access: RUA aVF+ bruit   OP HD: MWF    4h  61kg  450/800   2/2 bath  P2  Hep 1800  -sensipar 180mg   -calcitriol 64mcg  -no esa, no iron   Problem/Plan: 1.L retroperitoneal perirenal bleed / hematoma -left flank pain this a.m. room RN calling admit team, Bld seen on   CT 8/18. S/p 2 units FFP, 4 units pRBC total and Vit K. IV heparin started 8/21 and stopped 8/22, given more pRBC. IV hep and coumadin restarted 8/23. AM HGB  9.5 < 10.5 ,INR remains subtherapeutic, 1.7today. Seen by Urology, if rebleeds plan is to embolize L kidney. Hgb stable in 9s. Noted last Hgb 10.5,Rpt CT Abd w/ IV contrast no clear identified renal mass, can rpt testing once hematoma resolved 2. Rectal bleeding- resolved.  3.Hx AVR/MVR- on chronic anticoagulation with warfarin 4. ESRD- on HD MWF. Next HD in a.m. Monday.  Using no heparin 5. Anemiaof CKD- As above S/p 4 units pRBC. TSAT 84% 8/30.  6. Secondary hyperparathyroidism- Phos at goal. 3.4 cont low Ca bath. . Continue VDRA and sensipar. Noted on Auryxia 840 mg with phosphorus controlled in hospital and with high TSAT level but transfused will decrease to 420 G follow up trend no changes for now 7.HTN/volume-  BP stable, UF to 61kg in a.m. 8. Nutrition- Renal diet w/fluid restrictions. Alb 2.8. Protein supplements.     Ernest Haber, PA-C [facility] Beeper [phone] 01/24/2020,12:20 PM  LOS: 18 days   Labs: Basic Metabolic Panel: Recent Labs  Lab 01/22/20 0846 01/23/20 0800 01/24/20 0614  NA 130* 131* 131*  K 3.9 4.0 4.3  CL 90* 93* 94*  CO2 27 25 25   GLUCOSE 84 113* 80  BUN 33* 26* 42*  CREATININE 7.36* 5.61* 7.28*  CALCIUM 9.4 10.2 9.9  PHOS 3.9 3.4 3.4   Liver Function Tests: Recent Labs  Lab 01/22/20 0846 01/23/20 0800 01/24/20 0614  ALBUMIN 2.9* 3.2* 2.8*   No results for input(s): LIPASE, AMYLASE in the last 168 hours. No results for input(s): AMMONIA in the last 168 hours. CBC: Recent Labs  Lab 01/19/20 1509 01/19/20 1509 01/20/20 0551 01/20/20 0551 01/21/20 0459 01/21/20 0459 01/22/20 0846 01/23/20 0800 01/24/20 0614  WBC 7.3   < > 8.3   < > 8.6   < > 7.3 7.0 6.6  NEUTROABS 5.6  --   --   --   --   --   --   --   --   HGB 9.5*   < > 9.6*   < > 10.5*   < > 9.7* 10.7* 9.5*  HCT 31.1*   < > 29.7*   < >  32.3*   < > 30.6* 34.1* 30.0*  MCV 100.6*   < > 99.0  --  98.8  --  98.1 99.1 98.7  PLT 297   < > 309   < > 311   < > 270 275 236   < > = values in this interval not displayed.   Cardiac Enzymes: No results for input(s): CKTOTAL, CKMB, CKMBINDEX, TROPONINI in the last 168 hours. CBG: No results for input(s): GLUCAP in the last 168 hours.  Studies/Results: No results found. Medications:  sodium chloride     heparin 1,000 Units/hr (01/23/20 2320)    calcitRIOL  2 mcg Oral Q M,W,F   Chlorhexidine Gluconate Cloth  6 each Topical Q0600   cinacalcet  180 mg Oral Q M,W,F-HD   docusate sodium  100 mg Oral Daily   ferric citrate  840 mg Oral TID WC   hydrALAZINE       metoprolol tartrate  25 mg Oral BID   multivitamin  1 tablet Oral QHS   pantoprazole  40 mg Oral QAC breakfast   polyethylene glycol  17 g Oral BID   senna  1  tablet Oral Daily   simethicone  80 mg Oral TID   warfarin  10 mg Oral ONCE-1600   Warfarin - Pharmacist Dosing Inpatient   Does not apply [id number]

## 2020-01-25 LAB — CBC
HCT: 30.6 % — ABNORMAL LOW (ref 36.0–46.0)
Hemoglobin: 9.7 g/dL — ABNORMAL LOW (ref 12.0–15.0)
MCH: 31.1 pg (ref 26.0–34.0)
MCHC: 31.7 g/dL (ref 30.0–36.0)
MCV: 98.1 fL (ref 80.0–100.0)
Platelets: 236 10*3/uL (ref 150–400)
RBC: 3.12 MIL/uL — ABNORMAL LOW (ref 3.87–5.11)
RDW: 16.1 % — ABNORMAL HIGH (ref 11.5–15.5)
WBC: 9.1 10*3/uL (ref 4.0–10.5)
nRBC: 0 % (ref 0.0–0.2)

## 2020-01-25 LAB — RENAL FUNCTION PANEL
Albumin: 2.9 g/dL — ABNORMAL LOW (ref 3.5–5.0)
Anion gap: 15 (ref 5–15)
BUN: 51 mg/dL — ABNORMAL HIGH (ref 6–20)
CO2: 25 mmol/L (ref 22–32)
Calcium: 10.1 mg/dL (ref 8.9–10.3)
Chloride: 90 mmol/L — ABNORMAL LOW (ref 98–111)
Creatinine, Ser: 9.34 mg/dL — ABNORMAL HIGH (ref 0.44–1.00)
GFR calc Af Amer: 5 mL/min — ABNORMAL LOW (ref >60)
GFR calc non Af Amer: 4 mL/min — ABNORMAL LOW (ref >60)
Glucose, Bld: 90 mg/dL (ref 70–99)
Phosphorus: 4.9 mg/dL — ABNORMAL HIGH (ref 2.5–4.6)
Potassium: 4.8 mmol/L (ref 3.5–5.1)
Sodium: 130 mmol/L — ABNORMAL LOW (ref 135–145)

## 2020-01-25 LAB — HEPATITIS B SURFACE ANTIGEN: Hepatitis B Surface Ag: NONREACTIVE

## 2020-01-25 LAB — HEPARIN LEVEL (UNFRACTIONATED)
Heparin Unfractionated: 0.75 IU/mL — ABNORMAL HIGH (ref 0.30–0.70)
Heparin Unfractionated: 0.25 IU/mL — ABNORMAL LOW (ref 0.30–0.70)

## 2020-01-25 LAB — HEMOGLOBIN AND HEMATOCRIT, BLOOD
HCT: 27.9 % — ABNORMAL LOW (ref 36.0–46.0)
Hemoglobin: 9 g/dL — ABNORMAL LOW (ref 12.0–15.0)

## 2020-01-25 LAB — PROTIME-INR
INR: 2.2 — ABNORMAL HIGH (ref 0.8–1.2)
Prothrombin Time: 23.5 seconds — ABNORMAL HIGH (ref 11.4–15.2)

## 2020-01-25 MED ORDER — HYDROMORPHONE HCL 1 MG/ML IJ SOLN
INTRAMUSCULAR | Status: AC
  Filled 2020-01-25: qty 1

## 2020-01-25 MED ORDER — CALCITRIOL 0.5 MCG PO CAPS
ORAL_CAPSULE | ORAL | Status: AC
  Filled 2020-01-25: qty 4

## 2020-01-25 NOTE — Progress Notes (Signed)
ANTICOAGULATION CONSULT NOTE - Follow Up Consult  Pharmacy Consult for Heparin Indication: artificial heart valves  HPI: s/p RP hematoma. Warfarin PTA for hx mechanical AVR/MVR (goal 2.5-3.5) reversed vit K 10mg  IV + 2 units FFP on 8/18. PTA Warfarin: 9 mg daily except 6 mg on Mondays- last 8/18. (INR was 3.6 on admit 8/18)  Heparin level down to 0.25 on 850 units/hr  Goal of Therapy:  Heparin level 0.3-0.5 units/ml  Monitor platelets by anticoagulation protocol: Yes   Plan:  Increase heparin to 900 units/hr Heparin level and CBC in am  Hildred Laser, PharmD Clinical Pharmacist **Pharmacist phone directory can now be found on [email or web address] ([email or web address]).  Listed under [place].

## 2020-01-25 NOTE — Progress Notes (Signed)
PT Cancellation Note  Patient Details Name: Smt Lokey MRN: [id number] DOB: 11-Oct-1960   Cancelled Treatment:    Reason Eval/Treat Not Completed: Other (comment) (Declined as she states she has been nauseated for 2 days. )   Denice Paradise 01/25/2020, 3:13 PM Daisja Kessinger W,PT Acute Rehabilitation Services Pager:  [phone]  Office:  [phone]

## 2020-01-25 NOTE — Progress Notes (Signed)
ANTICOAGULATION CONSULT NOTE - Follow Up Consult  Pharmacy Consult for Heparin Indication: artificial heart valves  HPI: s/p RP hematoma. Warfarin PTA for hx mechanical AVR/MVR (goal 2.5-3.5) reversed vit K 10mg  IV + 2 units FFP on 8/18. PTA Warfarin: 9 mg daily except 6 mg on Mondays- last 8/18. (INR was 3.6 on admit 8/18)  Heparin level this am 0.75 units/ml.  No bleeding reported  Goal of Therapy:  Heparin level 0.3-0.5 units/ml  Monitor platelets by anticoagulation protocol: Yes   Plan:  - Decrease heparin drip to 850 units/hr Check heparin level later today  Thank you for involving pharmacy in this patient's care.  Excell Seltzer, PharmD

## 2020-01-25 NOTE — Progress Notes (Signed)
PT Cancellation Note  Patient Details Name: [name] MRN: [id number] DOB: 31-Jul-1960   Cancelled Treatment:    Reason Eval/Treat Not Completed: Patient at procedure or test/unavailable (Pt in HD.  Will check back as able. )   [name] 01/25/2020, 11:45 AM [name],PT Acute Rehabilitation Services Pager:  [phone]  Office:  [phone]

## 2020-01-25 NOTE — Progress Notes (Signed)
PROGRESS NOTE    [name]   PYK:[id number]  DOB: 24-Feb-1961  DOA: 01/06/2020 PCP: Benito Mccreedy, MD   Brief Narrative:  [name]  is an 59 y.o. female past medical history of end-stage renal disease on hemodialysis Monday Wednesday and Friday status post aortic and mitral valve replacement on Coumadin who presents with nausea and nonbloody emesis, she was found to have a spontaneous left retroperitoneal bleed she status post 1 unit of packed red blood cells 2 units of fresh frozen plasma and vitamin K.   Subjective: She is having severe left sided flank pain since waking up this AM.   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 - spontaneous hemorrhage - s/p 4 U PRBC, Vit K and 2 U FFP - has resolved and Coumadin has been resumed with Heparin bridge on 8/23 - ultrasound of abdomen reveals stable retroperitoneal bleed - INR now 1.6 - awaiting INR of 2.5- being complicated by gross hematuria- see below - 9/2> CT abd/pelvis shows a stable hematoma - patient is having recurrent left flank pain now which she states feels similar to the pain she had when she was found to have the RP hemorrhage - will follow Hb Q 12 >>  if dropping, will need to repeat CT abd/pelvis to look for re-bleeding - I have ordered Dilaudid IV for her pain and increased her Oxycodone - Hb stable in 9-10 range- cont to follow Hemoglobin    Active Problems: Gross hematuria - obtain CT abd./pelvis to see if a source of bleeding can be found-  - current amount of blood is small and Hb is stable  - abd/pelvic CT shows nothing new-  - she has a follow up appointment for urology to investigate this further on Tuesday    End-stage renal disease on hemodialysis  Secondary hyperparathyroidism - cont HD on MWF    Time spent in minutes: 57- discussed plan with Dr Jonnie Finner DVT prophylaxis: Heparin/ Coumadin Code Status: Full  code Family Communication:  Disposition Plan:  Status is: Inpatient  Remains inpatient appropriate because:cont to follow INR and discharge when therapeutic  Dispo: The patient is from: Home              Anticipated d/c is to: Home              Anticipated d/c date is: 3 days              Patient currently is not medically stable to d/c.  Consultants:   Nephrology  Cardiology   Urology Procedures:     Antimicrobials:  Anti-infectives (From admission, onward)   None       Objective: Vitals:   01/25/20 1208 01/25/20 1215 01/25/20 1407 01/25/20 1655  BP: 104/66 123/66 118/72 118/75  Pulse: (!) 105 (!) 101 (!) 110 (!) 107  Resp:   16 18  Temp:  98.1 F (36.7 C) 98.5 F (36.9 C) 99.1 F (37.3 C)  TempSrc:  Axillary Oral Oral  SpO2: 100%  100% 100%  Weight: 62.4 kg     Height:        Intake/Output Summary (Last 24 hours) at 01/25/2020 1657 Last data filed at 01/25/2020 1356 Gross per 24 hour  Intake 240 ml  Output 456 ml  Net -216 ml   Filed Weights   01/25/20 0025 01/25/20 0800 01/25/20 1208  Weight: 62.6 kg 62.6 kg 62.4 kg    Examination: General exam:  Appears comfortable  HEENT: PERRLA, oral mucosa moist, no sclera icterus or thrush Respiratory system: Clear to auscultation. Respiratory effort normal. Cardiovascular system: S1 & S2 heard,  No murmurs  Gastrointestinal system: Abdomen soft, non-tender, nondistended. Normal bowel sounds   Central nervous system: Alert and oriented. No focal neurological deficits. Extremities: No cyanosis, clubbing or edema Skin: No rashes or ulcers Psychiatry:  Mood & affect appropriate.    Data Reviewed: I have personally reviewed following labs and imaging studies  CBC: Recent Labs  Lab 01/19/20 1509 01/20/20 0551 01/21/20 0459 01/22/20 0846 01/23/20 0800 01/24/20 0614 01/25/20 0430  WBC 7.3   < > 8.6 7.3 7.0 6.6 9.1  NEUTROABS 5.6  --   --   --   --   --   --   HGB 9.5*   < > 10.5* 9.7* 10.7* 9.5* 9.7*   HCT 31.1*   < > 32.3* 30.6* 34.1* 30.0* 30.6*  MCV 100.6*   < > 98.8 98.1 99.1 98.7 98.1  PLT 297   < > 311 270 275 236 236   < > = values in this interval not displayed.   Basic Metabolic Panel: Recent Labs  Lab 01/21/20 0459 01/22/20 0846 01/23/20 0800 01/24/20 0614 01/25/20 0430  NA 133* 130* 131* 131* 130*  K 3.8 3.9 4.0 4.3 4.8  CL 92* 90* 93* 94* 90*  CO2 29 27 25 25 25   GLUCOSE 92 84 113* 80 90  BUN 15 33* 26* 42* 51*  CREATININE 4.59* 7.36* 5.61* 7.28* 9.34*  CALCIUM 9.3 9.4 10.2 9.9 10.1  PHOS 3.3 3.9 3.4 3.4 4.9*   GFR: Estimated Creatinine Clearance: 6.2 mL/min (A) (by C-G formula based on SCr of 9.34 mg/dL (H)). Liver Function Tests: Recent Labs  Lab 01/21/20 0459 01/22/20 0846 01/23/20 0800 01/24/20 0614 01/25/20 0430  ALBUMIN 2.8* 2.9* 3.2* 2.8* 2.9*   No results for input(s): LIPASE, AMYLASE in the last 168 hours. No results for input(s): AMMONIA in the last 168 hours. Coagulation Profile: Recent Labs  Lab 01/21/20 0459 01/22/20 0859 01/23/20 0800 01/24/20 0614 01/25/20 0430  INR 1.6* 1.7* 1.6* 1.7* 2.2*   Cardiac Enzymes: No results for input(s): CKTOTAL, CKMB, CKMBINDEX, TROPONINI in the last 168 hours. BNP (last 3 results) No results for input(s): PROBNP in the last 8760 hours. HbA1C: No results for input(s): HGBA1C in the last 72 hours. CBG: No results for input(s): GLUCAP in the last 168 hours. Lipid Profile: No results for input(s): CHOL, HDL, LDLCALC, TRIG, CHOLHDL, LDLDIRECT in the last 72 hours. Thyroid Function Tests: No results for input(s): TSH, T4TOTAL, FREET4, T3FREE, THYROIDAB in the last 72 hours. Anemia Panel: No results for input(s): VITAMINB12, FOLATE, FERRITIN, TIBC, IRON, RETICCTPCT in the last 72 hours. Urine analysis:    Component Value Date/Time   COLORURINE YELLOW 05/01/2015 2005   APPEARANCEUR CLEAR 05/01/2015 2005   LABSPEC 1.012 05/01/2015 2005   PHURINE 8.5 (H) 05/01/2015 2005   GLUCOSEU 100 (A)  05/01/2015 2005   HGBUR SMALL (A) 05/01/2015 2005   BILIRUBINUR NEGATIVE 05/01/2015 2005   KETONESUR NEGATIVE 05/01/2015 2005   PROTEINUR 100 (A) 05/01/2015 2005   UROBILINOGEN 0.2 07/27/2014 0150   NITRITE NEGATIVE 05/01/2015 2005   LEUKOCYTESUR NEGATIVE 05/01/2015 2005   Sepsis Labs: @LABRCNTIP (procalcitonin:4,lacticidven:4) )No results found for this or any previous visit (from the past 240 hour(s)).       Radiology Studies: No results found.    Scheduled Meds:  calcitRIOL  2 mcg Oral Q M,W,F  Chlorhexidine Gluconate Cloth  6 each Topical Q0600   cinacalcet  180 mg Oral Q M,W,F-HD   docusate sodium  100 mg Oral Daily   ferric citrate  420 mg Oral TID WC   metoprolol tartrate  25 mg Oral BID   multivitamin  1 tablet Oral QHS   pantoprazole  40 mg Oral QAC breakfast   polyethylene glycol  17 g Oral BID   senna  1 tablet Oral Daily   simethicone  80 mg Oral TID   Warfarin - Pharmacist Dosing Inpatient   Does not apply q1600   Continuous Infusions:  sodium chloride     heparin 850 Units/hr (01/25/20 0630)     LOS: 19 days      Debbe Odea, MD [facility] Pager: [email or web address] 01/25/2020, 4:57 PM

## 2020-01-25 NOTE — Progress Notes (Signed)
Subjective: Complaining of left flank pain, "same as the 1st round" when she was admitted. Hb stable today at 9.7.  INR up to 2.2 today. Some sinus tach, not severe. No SOB or cough.    Objective Vital signs in last 24 hours: Vitals:   01/25/20 1100 01/25/20 1130 01/25/20 1208 01/25/20 1215  BP: 104/61 (!) 106/54 104/66 123/66  Pulse: (!) 105 (!) 105 (!) 105 (!) 101  Resp:      Temp:    98.1 F (36.7 C)  TempSrc:    Axillary  SpO2:   100%   Weight:   62.4 kg   Height:       Weight change: -1 kg  Physical Exam: General: Thin female complaining of pain , no distress Heart: RRR, 2/6 SEM, mechanical heart sound Lungs: CTA bilaterally nonlabored breathing Abdomen: Bowel sounds positive, nondistended, tender left flank Extremities: No lower extremity edema Dialysis Access: RUA aVF+ bruit   OP HD: MWF    4h  61kg  450/800   2/2 bath  P2  Hep 1800  -sensipar 180mg   -calcitriol 65mcg  -no esa, no iron   Problem/Plan: 1.L retroperitoneal perirenal bleed / hematoma -left flank pain this a.m. room RN calling admit team, Bld seen on CT 8/18. S/p 2 units FFP, 4 units pRBC total and Vit K. IV heparin started 8/21 and stopped 8/22, given more pRBC. IV hep and coumadin restarted 8/23.Seen by Urology, if rebleeds plan is to embolize L kidney. Rpt CT Abd w/ IV contrast identified an organizing hematoma, no renal mass could be ID's though. - Hgb was 10.7 > 9.5 > 9.7 today - pt's L flank pain has recurred last 48hrs, concern for RP rebleed - INR up 2.2 today - have d/w pmd , follow serial Hb q 6-8 hrs for now, if rebleeding will d/w urology and consider embolization per IR  2. 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 ESRD- on HD MWF. Next HD in a.m. Monday.  Using no heparin 5. Anemiaof CKD- As above S/p 4 units pRBC. TSAT 84% 8/30.  6. Secondary hyperparathyroidism- Phos at goal. 3.4 cont low Ca bath. . Continue VDRA and  sensipar. Noted on Auryxia 840 mg with phosphorus controlled in hospital and with high TSAT level but transfused will decrease to 420 G follow up trend no changes for now 7.HTN/volume- BP stable, UF to 61kg in a.m. 8. Nutrition- Renal diet w/fluid restrictions. Alb 2.8. Protein supplements.    [name], MD 01/25/2020, 1:07 PM      Labs: Basic Metabolic Panel: Recent Labs  Lab 01/23/20 0800 01/24/20 0614 01/25/20 0430  NA 131* 131* 130*  K 4.0 4.3 4.8  CL 93* 94* 90*  CO2 25 25 25   GLUCOSE 113* 80 90  BUN 26* 42* 51*  CREATININE 5.61* 7.28* 9.34*  CALCIUM 10.2 9.9 10.1  PHOS 3.4 3.4 4.9*   Liver Function Tests: Recent Labs  Lab 01/23/20 0800 01/24/20 0614 01/25/20 0430  ALBUMIN 3.2* 2.8* 2.9*   No results for input(s): LIPASE, AMYLASE in the last 168 hours. No results for input(s): AMMONIA in the last 168 hours. CBC: Recent Labs  Lab 01/19/20 1509 01/20/20 0551 01/21/20 0459 01/21/20 0459 01/22/20 0846 01/22/20 0846 01/23/20 0800 01/24/20 0614 01/25/20 0430  WBC 7.3   < > 8.6   < > 7.3   < > 7.0 6.6 9.1  NEUTROABS 5.6  --   --   --   --   --   --   --   --  HGB 9.5*   < > 10.5*   < > 9.7*   < > 10.7* 9.5* 9.7*  HCT 31.1*   < > 32.3*   < > 30.6*   < > 34.1* 30.0* 30.6*  MCV 100.6*   < > 98.8  --  98.1  --  99.1 98.7 98.1  PLT 297   < > 311   < > 270   < > 275 236 236   < > = values in this interval not displayed.   Cardiac Enzymes: No results for input(s): CKTOTAL, CKMB, CKMBINDEX, TROPONINI in the last 168 hours. CBG: No results for input(s): GLUCAP in the last 168 hours.  Studies/Results: No results found. Medications: . sodium chloride    . heparin 850 Units/hr (01/25/20 0630)   . calcitRIOL      . calcitRIOL  2 mcg Oral Q M,W,F  . Chlorhexidine Gluconate Cloth  6 each Topical Q0600  . cinacalcet  180 mg Oral Q M,W,F-HD  . docusate sodium  100 mg Oral Daily  . ferric citrate  420 mg Oral TID WC  . HYDROmorphone      . metoprolol  tartrate  25 mg Oral BID  . multivitamin  1 tablet Oral QHS  . pantoprazole  40 mg Oral QAC breakfast  . polyethylene glycol  17 g Oral BID  . senna  1 tablet Oral Daily  . simethicone  80 mg Oral TID  . Warfarin - Pharmacist Dosing Inpatient   Does not apply [id number]

## 2020-01-26 ENCOUNTER — Inpatient Hospital Stay (HOSPITAL_COMMUNITY): Payer: Medicare HMO

## 2020-01-26 LAB — RENAL FUNCTION PANEL
Albumin: 2.7 g/dL — ABNORMAL LOW (ref 3.5–5.0)
Anion gap: 12 (ref 5–15)
BUN: 16 mg/dL (ref 6–20)
CO2: 28 mmol/L (ref 22–32)
Calcium: 9.2 mg/dL (ref 8.9–10.3)
Chloride: 95 mmol/L — ABNORMAL LOW (ref 98–111)
Creatinine, Ser: 4.92 mg/dL — ABNORMAL HIGH (ref 0.44–1.00)
GFR calc Af Amer: 11 mL/min — ABNORMAL LOW (ref >60)
GFR calc non Af Amer: 9 mL/min — ABNORMAL LOW (ref >60)
Glucose, Bld: 99 mg/dL (ref 70–99)
Phosphorus: 3.8 mg/dL (ref 2.5–4.6)
Potassium: 3.7 mmol/L (ref 3.5–5.1)
Sodium: 135 mmol/L (ref 135–145)

## 2020-01-26 LAB — CBC
HCT: 27 % — ABNORMAL LOW (ref 36.0–46.0)
Hemoglobin: 8.6 g/dL — ABNORMAL LOW (ref 12.0–15.0)
MCH: 32.1 pg (ref 26.0–34.0)
MCHC: 31.9 g/dL (ref 30.0–36.0)
MCV: 100.7 fL — ABNORMAL HIGH (ref 80.0–100.0)
Platelets: 218 10*3/uL (ref 150–400)
RBC: 2.68 MIL/uL — ABNORMAL LOW (ref 3.87–5.11)
RDW: 16.6 % — ABNORMAL HIGH (ref 11.5–15.5)
WBC: 10.5 10*3/uL (ref 4.0–10.5)
nRBC: 0 % (ref 0.0–0.2)

## 2020-01-26 LAB — PROTIME-INR
INR: 2.9 — ABNORMAL HIGH (ref 0.8–1.2)
Prothrombin Time: 29.4 seconds — ABNORMAL HIGH (ref 11.4–15.2)

## 2020-01-26 LAB — HEMOGLOBIN AND HEMATOCRIT, BLOOD
HCT: 27.3 % — ABNORMAL LOW (ref 36.0–46.0)
Hemoglobin: 8.5 g/dL — ABNORMAL LOW (ref 12.0–15.0)

## 2020-01-26 LAB — HEPARIN LEVEL (UNFRACTIONATED): Heparin Unfractionated: 0.28 IU/mL — ABNORMAL LOW (ref 0.30–0.70)

## 2020-01-26 MED ORDER — VITAMIN K1 10 MG/ML IJ SOLN
5.0000 mg | Freq: Once | INTRAVENOUS | Status: AC
  Administered 2020-01-26: 5 mg via INTRAVENOUS
  Filled 2020-01-26: qty 0.5

## 2020-01-26 MED ORDER — MORPHINE SULFATE (PF) 2 MG/ML IV SOLN
2.0000 mg | INTRAVENOUS | Status: DC | PRN
  Administered 2020-01-26: 4 mg via INTRAVENOUS
  Administered 2020-01-26 (×4): 2 mg via INTRAVENOUS
  Administered 2020-01-27 – 2020-01-28 (×6): 4 mg via INTRAVENOUS
  Filled 2020-01-26 (×3): qty 1
  Filled 2020-01-26: qty 2
  Filled 2020-01-26: qty 1
  Filled 2020-01-26 (×9): qty 2

## 2020-01-26 MED ORDER — ONDANSETRON HCL 4 MG/2ML IJ SOLN
4.0000 mg | Freq: Four times a day (QID) | INTRAMUSCULAR | Status: DC | PRN
  Administered 2020-01-29 – 2020-02-12 (×6): 4 mg via INTRAVENOUS
  Filled 2020-01-26 (×6): qty 2

## 2020-01-26 NOTE — Progress Notes (Signed)
PT Cancellation Note  Patient Details Name: Rakhi Romagnoli MRN: [id number] DOB: May 14, 1961   Cancelled Treatment:    Reason Eval/Treat Not Completed: Other (comment) (Pt c/o abdominal and side pain.  Will continue to follow. )   Denice Paradise 01/26/2020, 7:11 AM Saif Peter W,PT Acute Rehabilitation Services Pager:  [phone]  Office:  [phone]

## 2020-01-26 NOTE — Progress Notes (Addendum)
ANTICOAGULATION CONSULT NOTE - Follow Up Consult  Pharmacy Consult for Heparin , warfarin Indication: artificial heart valves  HPI: s/p RP hematoma. Warfarin PTA for hx mechanical AVR/MVR (goal 2.5-3.5) reversed vit K 10mg  IV + 2 units FFP on 8/18.  PTA Warfarin: 9 mg daily except 6 mg on Mondays- last 8/18. (INR was 3.6 on admit 8/18)  Heparin level 0.28 on 900 units/hr.  INR is 2.9, therapeutic today, goal INR 2.5-3.5  Dr. Wynelle Cleveland ordered okay to discontinue IV heparin this AM as INR is therapeutic.   Hb down mid 8's today. L falnk pain this AM.  Patient is going for CT abd per TR[facility] this AM.   Goal of Therapy:  INR 2.5-3.5 Heparin level 0.3-0.5 units/ml  Monitor platelets by anticoagulation protocol: Yes   Plan:  Discontinue IV heparin drip Will follow up CT abd result and with Dr. Wynelle Cleveland before ordering warfarin today. Daily INR.   Nicole Cella, RPh Clinical Pharmacist **Pharmacist phone directory can now be found on [email or web address] ([email or web address]).  Listed under [place]. 01/26/2020 11:34 AM   ADDENDUM:    Dr. Wynelle Cleveland notes CT abdominal her hematoma does appear larger than before and plan is  that IR will do an angiogram tomorrow to determine if still bleeding. - Dr Earleen Newport requests that we decrease her INR to below 2.0 for the angiogram tomorrow.  Dr. Wynelle Cleveland plans to order Vitamin K today  Plan:   Hold warfarin today.  F/u INR in AM.  Order consult pharmacy if IV heparin needed once INR <2  Nicole Cella, RPh Clinical Pharmacist Please check AMION for all [place] phone numbers After 10:00 PM, call [place] [phone]  01/26/2020 3:50 PM

## 2020-01-26 NOTE — Progress Notes (Addendum)
PROGRESS NOTE    [name]   ZOX:[id number]  DOB: 1960-08-16  DOA: 01/06/2020 PCP: Benito Mccreedy, MD   Brief Narrative:  [name]  is an 59 y.o. female past medical history of end-stage renal disease on hemodialysis Monday Wednesday and Friday status post aortic and mitral valve replacement on Coumadin who presents with nausea and nonbloody emesis, she was found to have a spontaneous left retroperitoneal bleed she status post 1 unit of packed red blood cells 2 units of fresh frozen plasma and vitamin K.   Subjective: She has ongoing left sided flank pain and is now having nausea and vomiting.   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 - this appears to have been a spontaneous hemorrhage - s/p 4 U PRBC, Vit K and 2 U FFP - has resolved and Coumadin has been resumed with Heparin bridge on 8/23 - ultrasound of abdomen reveals stable retroperitoneal bleed - 9/2> CT abd/pelvis shows a stable hematoma - patient is having recurrent severe left flank pain now which she states feels similar to the pain she had when she was found to have the RP hemorrhage - I have ordered Dilaudid IV for her pain and increased her Oxycodone - Hb dropped to 8 today - I have reordered a CT scan her and her hematoma does appear larger than before thus, I consulted IR for an embolization - per IR, it is difficult to tell if she is still bleeding and thus IR will do an angiogram tomorrow- Dr Earleen Newport requests that we decrease her INR to below 2.0 for the angiogram tomorrow- I will give her 5 U of Vit K today-  - cont to follow Hemoglobin closely and transfuse if < 7.    Active Problems: Vomiting - patient claims the pain is causing this but it may also be the Dilauidid- will add PRN Zofran  Gross hematuria - obtained CT abd./pelvis to see if a source of bleeding can be found but this was unremarkable  - current amount of blood  is small  - I have spoken with urology - if she continues to have this issue when close to d/c, she will make follow up appointment for urology to investigate this further     End-stage renal disease on hemodialysis  Secondary hyperparathyroidism - cont HD on MWF    Time spent in minutes: 28- discussed plan with Dr Jonnie Finner and IR, Dr Jacqualyn Posey today DVT prophylaxis: Heparin/ Coumadin Code Status: Full code Family Communication:  Disposition Plan:  Status is: Inpatient  Remains inpatient appropriate because: retroperitoneal bleed- reverse INR for angiogram tomorrow  Dispo: The patient is from: Home              Anticipated d/c is to: Home              Anticipated d/c date is: 3 days              Patient currently is not medically stable to d/c.  Consultants:   Nephrology  Cardiology   Urology Procedures:     Antimicrobials:  Anti-infectives (From admission, onward)   None       Objective: Vitals:   01/25/20 2012 01/26/20 0123 01/26/20 0738 01/26/20 1131  BP: (!) 144/85 132/87 132/75 140/85  Pulse: (!) 108 (!) 103 (!) 103 (!) 103  Resp: 20 20 18    Temp: 98.1 F (36.7 C) 99 F (37.2 C) 99.6 F (37.6 C)  TempSrc: Oral Oral Oral   SpO2: 98% 98% 95%   Weight:  62.4 kg    Height:        Intake/Output Summary (Last 24 hours) at 01/26/2020 1429 Last data filed at 01/26/2020 1300 Gross per 24 hour  Intake 720 ml  Output --  Net 720 ml   Filed Weights   01/25/20 0800 01/25/20 1208 01/26/20 0123  Weight: 62.6 kg 62.4 kg 62.4 kg    Examination: General exam: Appears uncomfortable - vomiting HEENT: PERRLA, oral mucosa moist, no sclera icterus or thrush Respiratory system: Clear to auscultation. Respiratory effort normal. Cardiovascular system: S1 & S2 heard,  No murmurs  Gastrointestinal system: Abdomen soft, non-tender, nondistended. Normal bowel sounds  Central nervous system: Alert and oriented. No focal neurological deficits. Extremities: No cyanosis,  clubbing or edema Skin: No rashes or ulcers Psychiatry:  Mood & affect appropriate.    Data Reviewed: I have personally reviewed following labs and imaging studies  CBC: Recent Labs  Lab 01/19/20 1509 01/20/20 0551 01/22/20 0846 01/22/20 0846 01/23/20 0800 01/23/20 0800 01/24/20 0614 01/25/20 0430 01/25/20 1822 01/26/20 0511 01/26/20 0747  WBC 7.3   < > 7.3  --  7.0  --  6.6 9.1  --   --  10.5  NEUTROABS 5.6  --   --   --   --   --   --   --   --   --   --   HGB 9.5*   < > 9.7*   < > 10.7*   < > 9.5* 9.7* 9.0* 8.5* 8.6*  HCT 31.1*   < > 30.6*   < > 34.1*   < > 30.0* 30.6* 27.9* 27.3* 27.0*  MCV 100.6*   < > 98.1  --  99.1  --  98.7 98.1  --   --  100.7*  PLT 297   < > 270  --  275  --  236 236  --   --  218   < > = values in this interval not displayed.   Basic Metabolic Panel: Recent Labs  Lab 01/22/20 0846 01/23/20 0800 01/24/20 0614 01/25/20 0430 01/26/20 0511  NA 130* 131* 131* 130* 135  K 3.9 4.0 4.3 4.8 3.7  CL 90* 93* 94* 90* 95*  CO2 27 25 25 25 28   GLUCOSE 84 113* 80 90 99  BUN 33* 26* 42* 51* 16  CREATININE 7.36* 5.61* 7.28* 9.34* 4.92*  CALCIUM 9.4 10.2 9.9 10.1 9.2  PHOS 3.9 3.4 3.4 4.9* 3.8   GFR: Estimated Creatinine Clearance: 11.8 mL/min (A) (by C-G formula based on SCr of 4.92 mg/dL (H)). Liver Function Tests: Recent Labs  Lab 01/22/20 0846 01/23/20 0800 01/24/20 0614 01/25/20 0430 01/26/20 0511  ALBUMIN 2.9* 3.2* 2.8* 2.9* 2.7*   No results for input(s): LIPASE, AMYLASE in the last 168 hours. No results for input(s): AMMONIA in the last 168 hours. Coagulation Profile: Recent Labs  Lab 01/22/20 0859 01/23/20 0800 01/24/20 0614 01/25/20 0430 01/26/20 0511  INR 1.7* 1.6* 1.7* 2.2* 2.9*   Cardiac Enzymes: No results for input(s): CKTOTAL, CKMB, CKMBINDEX, TROPONINI in the last 168 hours. BNP (last 3 results) No results for input(s): PROBNP in the last 8760 hours. HbA1C: No results for input(s): HGBA1C in the last 72  hours. CBG: No results for input(s): GLUCAP in the last 168 hours. Lipid Profile: No results for input(s): CHOL, HDL, LDLCALC, TRIG, CHOLHDL, LDLDIRECT in the last 72 hours. Thyroid Function Tests: No  results for input(s): TSH, T4TOTAL, FREET4, T3FREE, THYROIDAB in the last 72 hours. Anemia Panel: No results for input(s): VITAMINB12, FOLATE, FERRITIN, TIBC, IRON, RETICCTPCT in the last 72 hours. Urine analysis:    Component Value Date/Time   COLORURINE YELLOW 05/01/2015 2005   APPEARANCEUR CLEAR 05/01/2015 2005   LABSPEC 1.012 05/01/2015 2005   PHURINE 8.5 (H) 05/01/2015 2005   GLUCOSEU 100 (A) 05/01/2015 2005   HGBUR SMALL (A) 05/01/2015 2005   BILIRUBINUR NEGATIVE 05/01/2015 2005   KETONESUR NEGATIVE 05/01/2015 2005   PROTEINUR 100 (A) 05/01/2015 2005   UROBILINOGEN 0.2 07/27/2014 0150   NITRITE NEGATIVE 05/01/2015 2005   LEUKOCYTESUR NEGATIVE 05/01/2015 2005   Sepsis Labs: @LABRCNTIP (procalcitonin:4,lacticidven:4) )No results found for this or any previous visit (from the past 240 hour(s)).       Radiology Studies: CT ABDOMEN PELVIS WO CONTRAST  Result Date: 01/26/2020 CLINICAL DATA:  Anemia, evaluate for retroperitoneal bleed. EXAM: CT ABDOMEN AND PELVIS WITHOUT CONTRAST TECHNIQUE: Multidetector CT imaging of the abdomen and pelvis was performed following the standard protocol without IV contrast. COMPARISON:  CT abdomen pelvis 01/21/2020, CT pelvis 01/06/2019 FINDINGS: Lower chest: Interval increase in right lower lobe consolidation with air bronchograms. Bibasilar linear atelectasis versus scarring. Aortic and mitral valve replacements. Hepatobiliary: No focal liver abnormality is seen. Layering hyperdensity within the gallbladder lumen may represent sludge versus tiny gallstones. No gallbladder wall thickening or biliary dilatation. Pancreas: Unremarkable. No pancreatic ductal dilatation or surrounding inflammatory changes. Spleen: Diffuse punctate splenic calcifications  likely related to prior granulomatous disease. Adrenals/Urinary Tract: Interval increase in size large heterogenous retroperitoneal hematoma along the ventral aspect of the left kidney that is difficult to measure on this noncontrast study with the largest dimensions of 11.4 X 10 x 15.3 cm (3:39, 6:89) (from 7.9 x 8.8 x 11.1 cm) with and again intraperitoneal extent with the largest contiguous portion measuring 7.5 x 5.7 x 11.5 cm (3:54, 6:51) (from 7.7 x 6.9 x 11.4cm). Interval loss of fat plane between the left psoas muscle and the left hematoma. Bilateral renal atrophy and cortical scarring. Redemonstration of several subcentimeter hypodensities within the kidneys that are too small to characterize. Stomach/Bowel: Oral contrast reaches the rectum. Stomach is within normal limits. Appendix appears normal. No evidence of bowel wall thickening, distention, or inflammatory changes. Scattered colonic diverticulosis. Mild circumferential bowel wall thickening of the descending colon and rectosigmoid colon with no definite pericolonic fat stranding. Vascular/Lymphatic: No abdominal aorta aneurysm. Severe atherosclerotic plaque of the aorta and its branches. No enlarged abdominal or pelvic lymph nodes. Reproductive: Couple degenerative calcified fibroids within the uterus. Otherwise the uterus and bilateral adnexa are unremarkable. Other: No abdominal wall hernia or abnormality. No simple free fluid ascites. No free intraperitoneal gas. Musculoskeletal: No acute or significant osseous findings. Degenerative changes of the spine and bilateral hips. IMPRESSION: Interval increase in size of a large left 11 x 10 x 15cm retroperitoneal hematoma with intraperitoneal extent. Interval increase in right lower lobe consolidation which may represent a combination of atelectasis versus infection/inflammation. Colonic diverticulosis with no acute diverticulitis. Biliary sludge versus tiny gallstones with CT evidence of no acute  cholecystitis. Electronically Signed   By: Iven Finn M.D.   On: 01/26/2020 11:51      Scheduled Meds:  calcitRIOL  2 mcg Oral Q M,W,F   Chlorhexidine Gluconate Cloth  6 each Topical Q0600   cinacalcet  180 mg Oral Q M,W,F-HD   docusate sodium  100 mg Oral Daily   ferric citrate  420 mg  Oral TID WC   metoprolol tartrate  25 mg Oral BID   multivitamin  1 tablet Oral QHS   pantoprazole  40 mg Oral QAC breakfast   polyethylene glycol  17 g Oral BID   senna  1 tablet Oral Daily   simethicone  80 mg Oral TID   Warfarin - Pharmacist Dosing Inpatient   Does not apply q1600   Continuous Infusions:  sodium chloride       LOS: 20 days      Debbe Odea, MD [facility] Pager: [email or web address] 01/26/2020, 2:29 PM

## 2020-01-26 NOTE — Progress Notes (Signed)
Subjective: continues w/ L abd/ flank pain and nausea/ vomiting. Hb down mid 8's today. Going for CT abd per pmd.    Objective Vital signs in last 24 hours: Vitals:   01/25/20 1655 01/25/20 2012 01/26/20 0123 01/26/20 0738  BP: 118/75 (!) 144/85 132/87 132/75  Pulse: (!) 107 (!) 108 (!) 103 (!) 103  Resp: 18 20 20 18   Temp: 99.1 F (37.3 C) 98.1 F (36.7 C) 99 F (37.2 C) 99.6 F (37.6 C)  TempSrc: Oral Oral Oral Oral  SpO2: 100% 98% 98% 95%  Weight:   62.4 kg   Height:       Weight change: 0 kg  Physical Exam: General: Thin female complaining of pain , no distress Heart: RRR, 2/6 SEM, mechanical heart sound Lungs: CTA bilaterally nonlabored breathing Abdomen: Bowel sounds positive, nondistended, tender left flank Extremities: No lower extremity edema Dialysis Access: RUA aVF+ bruit   OP HD: MWF    4h  61kg  450/800   2/2 bath  P2  Hep 1800  -sensipar 180mg   -calcitriol 44mcg  -no esa, no iron   Problem/Plan: 1.L retroperitoneal perirenal bleed / hematoma -left flank pain this a.m. room RN calling admit team, Bld seen on CT 8/18. S/p 2 units FFP, 4 units pRBC total and Vit K. IV heparin started 8/21 and stopped 8/22, given more pRBC. IV hep and coumadin restarted 8/23.Seen by Urology, if rebleeds plan is to embolize L kidney. Rpt CT Abd 9/02 w/ IV contrast identified an organizing hematoma, no renal mass could be seen - pt's L flank pain has recurred x last 2-3 days here, Hb declining to mid 8's, probably is bleeding again  - INR up 2.9  - IV heparin dc'd, plan is for repeat CT this am per pmd  2. 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 ESRD- on HD MWF. Next HD tomorrow. No heparin.  5. Anemiaof CKD- As above S/p 4 units pRBC. TSAT 84% 8/30.  6. Secondary hyperparathyroidism- Phos at goal. 3.4 cont low Ca bath. . Continue VDRA and sensipar. Noted on Auryxia 840 mg with phosphorus controlled in  hospital and with high TSAT level but transfused will decrease to 420 G follow up trend no changes for now 7.HTN/volume- BP stable, UF to 61kg tomorrow  8. Nutrition- Renal diet w/fluid restrictions. Alb 2.8. Protein supplements.    [name], MD 01/26/2020, 11:15 AM      Labs: Basic Metabolic Panel: Recent Labs  Lab 01/24/20 0614 01/25/20 0430 01/26/20 0511  NA 131* 130* 135  K 4.3 4.8 3.7  CL 94* 90* 95*  CO2 25 25 28   GLUCOSE 80 90 99  BUN 42* 51* 16  CREATININE 7.28* 9.34* 4.92*  CALCIUM 9.9 10.1 9.2  PHOS 3.4 4.9* 3.8   Liver Function Tests: Recent Labs  Lab 01/24/20 0614 01/25/20 0430 01/26/20 0511  ALBUMIN 2.8* 2.9* 2.7*   No results for input(s): LIPASE, AMYLASE in the last 168 hours. No results for input(s): AMMONIA in the last 168 hours. CBC: Recent Labs  Lab 01/19/20 1509 01/20/20 0551 01/22/20 0846 01/22/20 0846 01/23/20 0800 01/23/20 0800 01/24/20 0614 01/24/20 0614 01/25/20 0430 01/25/20 0430 01/25/20 1822 01/26/20 0511 01/26/20 0747  WBC 7.3   < > 7.3   < > 7.0   < > 6.6  --  9.1  --   --   --  10.5  NEUTROABS 5.6  --   --   --   --   --   --   --   --   --   --   --   --  HGB 9.5*   < > 9.7*   < > 10.7*   < > 9.5*   < > 9.7*   < > 9.0* 8.5* 8.6*  HCT 31.1*   < > 30.6*   < > 34.1*   < > 30.0*   < > 30.6*   < > 27.9* 27.3* 27.0*  MCV 100.6*   < > 98.1  --  99.1  --  98.7  --  98.1  --   --   --  100.7*  PLT 297   < > 270   < > 275   < > 236  --  236  --   --   --  218   < > = values in this interval not displayed.   Cardiac Enzymes: No results for input(s): CKTOTAL, CKMB, CKMBINDEX, TROPONINI in the last 168 hours. CBG: No results for input(s): GLUCAP in the last 168 hours.  Studies/Results: No results found. Medications: . sodium chloride     . calcitRIOL  2 mcg Oral Q M,W,F  . Chlorhexidine Gluconate Cloth  6 each Topical Q0600  . cinacalcet  180 mg Oral Q M,W,F-HD  . docusate sodium  100 mg Oral Daily  . ferric citrate   420 mg Oral TID WC  . metoprolol tartrate  25 mg Oral BID  . multivitamin  1 tablet Oral QHS  . pantoprazole  40 mg Oral QAC breakfast  . polyethylene glycol  17 g Oral BID  . senna  1 tablet Oral Daily  . simethicone  80 mg Oral TID  . Warfarin - Pharmacist Dosing Inpatient   Does not apply [id number]

## 2020-01-26 NOTE — Consult Note (Signed)
Chief Complaint: Patient was seen in consultation today for retroperitoneal bleed/renal arteriogram with possible embolization.  Referring Physician(s): Debbe Odea [facility])  Supervising Physician: Corrie Mckusick  Patient Status: [facility] - In-pt  History of Present Illness: [name] is a 59 y.o. female with a past medical history of valvular heart disease s/p mechanical atrial/mitral valve replacements 2020 on chronic anticoagulation with Coumadin, HF, CVA, pneumonia, GERD, hiatal hernia, PUD, lupus, ESRD on HD,and arthritis. She has been admitted to [facility] since 01/06/2020 for management of spontaneous left retroperitoneal hemorrhage in setting of Coumadin use. She was given 4 U PRBCs, vitamin K, and 2 U FFP. Her anticoagulation was also stopped. It appeared that bleeding had resolved, and patient was re-started on Coumadin (following Heparin bridge) on 01/11/2020. Yesterday, patient developed left flank pain similar to that of when retroperitoneal hemorrhage was discovered. Repeat CT abdomen/pelvis from today revealed interval increase in size of retroperitoneal hematoma, concerning for re-bleed.  CT abdomen/pelvis this AM: 1. Interval increase in size of a large left 11 x 10 x 15 cm retroperitoneal hematoma with intraperitoneal extent. 2. Interval increase in right lower lobe consolidation which may represent a combination of atelectasis versus infection/inflammation. 3. Colonic diverticulosis with no acute diverticulitis. 4. Biliary sludge versus tiny gallstones with CT evidence of no acute cholecystitis.  IR consulted by Dr. Wynelle Cleveland for possible image-guided renal arteriogram with possible embolization. Patient awake and alert sitting in bed. Female at bedside. Complains of left flank pain rated 7/10 at this time. Complains of hematuria (when able to urinate- per patient only urinates minimal drops ("if any") daily). Denies fever, chills, chest pain, dyspnea, abdominal pain, or  headache.   Past Medical History:  Diagnosis Date  . Anemia   . Arthritis    "joints" (10/23/2017)  . Blood transfusion '08   [facility]; "low HgB" (10/23/2017)  . Chronic diastolic CHF (congestive heart failure) ([place])   . Claustrophobia   . Dysfunctional uterine bleeding 12/19/2010  . ESRD (end stage renal disease) on dialysis [facility])    "MWF; [name]." (10/23/2017)  . GERD (gastroesophageal reflux disease)   . Headache   . Hemodialysis patient [facility])    right extremity port  . History of hiatal hernia   . Hx of cardiovascular stress test    Lexiscan Myoview 4/16:  Normal stress nuclear study, EF 59%  . Hypertension   . Lupus ([place])    "? kind" (10/23/2017)  . Mitral stenosis    Echo 4/16:  EF 55-60%, no RWMA, Gr 1 DD, mod MS (mean 9 mmHg), mod LAE, mild RAE, PASP 65, mod to severe TR, trivial eff // Echo 6/19:  Mild LVH, EF 55-60, no RWMA, Gr 2 DD, mild to mod AS (Mean 20), severe MS (mean 17), massive LAE, PASP 48, trivial effusion   . Peptic ulcer disease   . Pneumonia 10/21/2017  . S/P aortic valve replacement with metallic valve 0/17/5102   21 mm Sorin Carbomedics bileaflet mechanical valve  . S/P mitral valve replacement with metallic valve 5/85/2778   31 mm Sorin Carbomedics Optiform bileaflet mechanical valve  . Stroke [facility])    per patient "they said i had a small stroke but i couldnt even tell"  . Valvular heart disease (Aortic Stenosis and Mitral Stenosis) 09/25/2016   s/p mechanical AVR and MVR 11/2018 // Echo 02/2019: EF 65-70, mild LVH, mild LAE, normally functioning mechanical mitral valve prosthesis with no regurgitation or stenosis, trivial TR, normally functioning mechanical aortic valve prosthesis without regurgitation  or stenosis     Past Surgical History:  Procedure Laterality Date  . AORTIC VALVE REPLACEMENT  12/02/2018   AORTIC VALVE REPLACEMENT (AVR) USING CARBOMEDICS SUPRA-ANNULAR TOP HAT SIZE 21MM (N/A)  . AORTIC VALVE REPLACEMENT N/A 12/02/2018   Procedure: AORTIC  VALVE REPLACEMENT (AVR) USING CARBOMEDICS SUPRA-ANNULAR TOP HAT SIZE 21MM;  Surgeon: Rexene Alberts, MD;  Location: [place];  Service: Open Heart Surgery;  Laterality: N/A;  . AV FISTULA PLACEMENT Right 02/25/2018   Procedure: INSERTION OF 36mm x 16cm ARTEGRAFT;  Surgeon: Waynetta Sandy, MD;  Location: [place];  Service: Vascular;  Laterality: Right;  . COLONOSCOPY W/ BIOPSIES AND POLYPECTOMY    . DIALYSIS FISTULA CREATION  2007  . ENDOMETRIAL ABLATION    . ESOPHAGOGASTRODUODENOSCOPY N/A 07/31/2014   Procedure: ESOPHAGOGASTRODUODENOSCOPY (EGD);  Surgeon: Clarene Essex, MD;  Location: [facility] ENDOSCOPY;  Service: Endoscopy;  Laterality: N/A;  . FISTULOGRAM Right 02/25/2018   Procedure: FISTULOGRAM ARM;  Surgeon: Waynetta Sandy, MD;  Location: [place];  Service: Vascular;  Laterality: Right;  . HEMATOMA EVACUATION Right 03/06/2018   Procedure: EVACUATION HEMATOMA RIGHT UPPER ARM;  Surgeon: Waynetta Sandy, MD;  Location: [place];  Service: Vascular;  Laterality: Right;  . INSERTION OF ARTERIOVENOUS (AV) ARTEGRAFT ARM Right 09/26/2017   Procedure: INSERTION OF ARTERIOVENOUS (AV) ARTEGRAFT INTO RIGHT ARM;  Surgeon: Waynetta Sandy, MD;  Location: [place];  Service: Vascular;  Laterality: Right;  . INSERTION OF DIALYSIS CATHETER Right 03/06/2018   Procedure: INSERTION OF DIALYSIS CATHETER, right internal jugular;  Surgeon: Waynetta Sandy, MD;  Location: [place];  Service: Vascular;  Laterality: Right;  . IR THORACENTESIS ASP PLEURAL SPACE W/IMG GUIDE  01/07/2019  . MITRAL VALVE REPLACEMENT N/A 12/02/2018   Procedure: MITRAL VALVE (MV) REPLACEMENT USING CARBOMEDICS OPTIFORM SIZE 31MM;  Surgeon: Rexene Alberts, MD;  Location: [place];  Service: Open Heart Surgery;  Laterality: N/A;  . REVISON OF ARTERIOVENOUS FISTULA Right 09/26/2017   Procedure: REVISION OF ARTERIOVENOUS FISTULA RIGHT ARM WITH ARTEGRAFT;  Surgeon: Waynetta Sandy, MD;  Location: [place];  Service:  Vascular;  Laterality: Right;  . REVISON OF ARTERIOVENOUS FISTULA Right 02/25/2018   Procedure: REVISION OF ARTERIOVENOUS FISTULA;  Surgeon: Waynetta Sandy, MD;  Location: [place];  Service: Vascular;  Laterality: Right;  . RIGHT/LEFT HEART CATH AND CORONARY ANGIOGRAPHY N/A 07/24/2018   Procedure: RIGHT/LEFT HEART CATH AND CORONARY ANGIOGRAPHY;  Surgeon: Larey Dresser, MD;  Location: [place] CV LAB;  Service: Cardiovascular;  Laterality: N/A;  . SHOULDER OPEN ROTATOR CUFF REPAIR Right 10/09/2016   Procedure: ROTATOR CUFF REPAIR SHOULDER OPEN partial acrominectomy and extensive synovectomy;  Surgeon: Latanya [name], MD;  Location: [facility];  Service: Orthopedics;  Laterality: Right;  RNFA  . TEE WITHOUT CARDIOVERSION N/A 01/02/2018   Procedure: TRANSESOPHAGEAL ECHOCARDIOGRAM (TEE) Bubble Study;  Surgeon: Larey Dresser, MD;  Location: [facility] ENDOSCOPY;  Service: Cardiovascular;  Laterality: N/A;  . TEE WITHOUT CARDIOVERSION N/A 12/02/2018   Procedure: TRANSESOPHAGEAL ECHOCARDIOGRAM (TEE);  Surgeon: Rexene Alberts, MD;  Location: [place];  Service: Open Heart Surgery;  Laterality: N/A;  . TUBAL LIGATION      Allergies: Patient has no known allergies.  Medications: Prior to Admission medications   Medication Sig Start Date End Date Taking? Authorizing Provider  amoxicillin (AMOXIL) 500 MG capsule Take 4 capsules (2,000 mg total) by mouth once as needed (1 hour prior to any dental appointment/procedure including cleaning). 07/30/19  Yes Daune Perch, NP  Lorin Picket 1  GM 210 MG(Fe) tablet Take 840 mg by mouth 3 (three) times daily. 12/04/19  Yes [provider]  warfarin (COUMADIN) 3 MG tablet Take 3 tablets daily except 2 tablets on Mondays and Fridays by mouth or as directed by Coumadin Clinic. Patient taking differently: Take 6-9 mg by mouth See admin instructions. Take 3 tablets (=9mg ) daily except 2 tablets (= 6 mg) every Monday by mouth or as directed by Coumadin Clinic. 11/17/19   Yes Fay Records, MD  amLODipine (NORVASC) 10 MG tablet Take 1 tablet (10 mg total) by mouth at bedtime. Patient not taking: Reported on 01/06/2020 03/15/18   Roxan Hockey, MD  aspirin EC 81 MG tablet Take 81 mg by mouth at bedtime.    [provider]  metoprolol tartrate (LOPRESSOR) 25 MG tablet Take 12.5 mg by mouth 2 (two) times daily. 11/09/19   [provider]  multivitamin (RENA-VIT) TABS tablet Take 1 tablet by mouth at bedtime.     [provider]  ondansetron (ZOFRAN) 4 MG tablet Take 4 mg by mouth every 6 (six) hours as needed for nausea or vomiting.    [provider]  pantoprazole (PROTONIX) 40 MG tablet Take 40 mg by mouth 2 (two) times daily.     [provider]     Family History  Problem Relation Age of Onset  . Liver cancer Maternal Grandmother   . Lymphoma Maternal Aunt   . Hypertension Mother   . Breast cancer Mother   . Renal Disease Father   . Hypertension Father   . Heart attack Neg Hx     Social History   Socioeconomic History  . Marital status: Single    Spouse name: Not on file  . Number of children: Not on file  . Years of education: Not on file  . Highest education level: Not on file  Occupational History  . Not on file  Tobacco Use  . Smoking status: Former Smoker    Packs/day: 0.10    Years: 10.00    Pack years: 1.00    Types: Cigarettes    Start date: 10/21/2017    Quit date: 11/20/2018    Years since quitting: 1.1  . Smokeless tobacco: Never Used  Vaping Use  . Vaping Use: Never used  Substance and Sexual Activity  . Alcohol use: Yes    Alcohol/week: 1.0 standard drink    Types: 1 Cans of beer per week    Comment: occasional beer  . Drug use: Not Currently    Types: Marijuana, Cocaine    Comment: marijuana 11/27/2018  . Sexual activity: Not Currently  Other Topics Concern  . Not on file  Social History Narrative  . Not on file   Social Determinants of Health   Financial Resource  Strain:   . Difficulty of Paying Living Expenses: Not on file  Food Insecurity:   . Worried About Charity fundraiser in the Last Year: Not on file  . Ran Out of Food in the Last Year: Not on file  Transportation Needs:   . Lack of Transportation (Medical): Not on file  . Lack of Transportation (Non-Medical): Not on file  Physical Activity:   . Days of Exercise per Week: Not on file  . Minutes of Exercise per Session: Not on file  Stress:   . Feeling of Stress : Not on file  Social Connections:   . Frequency of Communication with Friends and Family: Not on file  . Frequency  of Social Gatherings with Friends and Family: Not on file  . Attends Religious Services: Not on file  . Active Member of Clubs or Organizations: Not on file  . Attends Archivist Meetings: Not on file  . Marital Status: Not on file     Review of Systems: A 12 point ROS discussed and pertinent positives are indicated in the HPI above.  All other systems are negative.  Review of Systems  Constitutional: Negative for chills and fever.  Respiratory: Negative for shortness of breath and wheezing.   Cardiovascular: Negative for chest pain and palpitations.  Gastrointestinal: Negative for abdominal pain.  Genitourinary: Positive for flank pain and hematuria.  Neurological: Negative for headaches.  Psychiatric/Behavioral: Negative for behavioral problems and confusion.    Vital Signs: BP 140/85   Pulse (!) 103   Temp 99.6 F (37.6 C) (Oral)   Resp 18   Ht 5\' 6"  (1.676 m)   Wt 137 lb 8 oz (62.4 kg)   LMP 12/05/2010 (LMP Unknown)   SpO2 95%   BMI 22.19 kg/m   Physical Exam Vitals and nursing note reviewed.  Constitutional:      General: She is not in acute distress.    Appearance: Normal appearance.  Cardiovascular:     Rate and Rhythm: Regular rhythm. Tachycardia present.     Heart sounds: Normal heart sounds. No murmur heard.   Pulmonary:     Effort: Pulmonary effort is normal. No  respiratory distress.     Breath sounds: Normal breath sounds. No wheezing.  Skin:    General: Skin is warm and dry.  Neurological:     Mental Status: She is alert and oriented to person, place, and time.      MD Evaluation Airway: WNL Heart: WNL Abdomen: WNL Chest/ Lungs: WNL ASA  Classification: 3 Mallampati/Airway Score: Two   Imaging: CT ABDOMEN PELVIS WO CONTRAST  Result Date: 01/26/2020 CLINICAL DATA:  Anemia, evaluate for retroperitoneal bleed. EXAM: CT ABDOMEN AND PELVIS WITHOUT CONTRAST TECHNIQUE: Multidetector CT imaging of the abdomen and pelvis was performed following the standard protocol without IV contrast. COMPARISON:  CT abdomen pelvis 01/21/2020, CT pelvis 01/06/2019 FINDINGS: Lower chest: Interval increase in right lower lobe consolidation with air bronchograms. Bibasilar linear atelectasis versus scarring. Aortic and mitral valve replacements. Hepatobiliary: No focal liver abnormality is seen. Layering hyperdensity within the gallbladder lumen may represent sludge versus tiny gallstones. No gallbladder wall thickening or biliary dilatation. Pancreas: Unremarkable. No pancreatic ductal dilatation or surrounding inflammatory changes. Spleen: Diffuse punctate splenic calcifications likely related to prior granulomatous disease. Adrenals/Urinary Tract: Interval increase in size large heterogenous retroperitoneal hematoma along the ventral aspect of the left kidney that is difficult to measure on this noncontrast study with the largest dimensions of 11.4 X 10 x 15.3 cm (3:39, 6:89) (from 7.9 x 8.8 x 11.1 cm) with and again intraperitoneal extent with the largest contiguous portion measuring 7.5 x 5.7 x 11.5 cm (3:54, 6:51) (from 7.7 x 6.9 x 11.4cm). Interval loss of fat plane between the left psoas muscle and the left hematoma. Bilateral renal atrophy and cortical scarring. Redemonstration of several subcentimeter hypodensities within the kidneys that are too small to  characterize. Stomach/Bowel: Oral contrast reaches the rectum. Stomach is within normal limits. Appendix appears normal. No evidence of bowel wall thickening, distention, or inflammatory changes. Scattered colonic diverticulosis. Mild circumferential bowel wall thickening of the descending colon and rectosigmoid colon with no definite pericolonic fat stranding. Vascular/Lymphatic: No abdominal aorta aneurysm. Severe atherosclerotic  plaque of the aorta and its branches. No enlarged abdominal or pelvic lymph nodes. Reproductive: Couple degenerative calcified fibroids within the uterus. Otherwise the uterus and bilateral adnexa are unremarkable. Other: No abdominal wall hernia or abnormality. No simple free fluid ascites. No free intraperitoneal gas. Musculoskeletal: No acute or significant osseous findings. Degenerative changes of the spine and bilateral hips. IMPRESSION: Interval increase in size of a large left 11 x 10 x 15cm retroperitoneal hematoma with intraperitoneal extent. Interval increase in right lower lobe consolidation which may represent a combination of atelectasis versus infection/inflammation. Colonic diverticulosis with no acute diverticulitis. Biliary sludge versus tiny gallstones with CT evidence of no acute cholecystitis. Electronically Signed   By: Iven Finn M.D.   On: 01/26/2020 11:51   US Abdomen Complete  Result Date: 01/20/2020 CLINICAL DATA:  Follow-up CT EXAM: ABDOMEN ULTRASOUND COMPLETE COMPARISON:  CT abdomen pelvis 01/06/2020 FINDINGS: Gallbladder: No gallbladder wall thickening. There is sludge and small echogenic mural foci, likely representing polyps measuring up to 0.5 cm. No definite shadowing calculi. Negative sonographic Murphy sign. Common bile duct: Diameter: 0.3 cm, within normal limits. Liver: No focal lesion identified. Within normal limits in parenchymal echogenicity. Portal vein is patent on color Doppler imaging with normal direction of blood flow towards the  liver. IVC: No abnormality visualized. Pancreas: Pancreatic duct appears slightly prominent measuring 0.4 cm. Spleen: Multiple granulomas throughout.  Normal size. Right Kidney: Length: 6.6 cm cysts. Echogenicity within normal limits. There are multiple small cysts. No hydronephrosis. Left Kidney: Not visualized secondary to complex mass in the left flank. There is a large heterogeneous mass measuring 18.2 x 7.8 x 8.3 cm, consistent with the previously identified hematoma. Abdominal aorta: No aneurysm visualized. Other findings: None. IMPRESSION: 1. Large complex mass in the left flank measuring 18.2 cm, consistent with the previously identified retroperitoneal hematoma. 2.  Atrophic right kidney with multiple small. 3. Gallbladder polyps measuring up to 0.5 cm. No dedicated follow-up is necessary. 4.  Mildly prominent main pancreatic duct. Electronically Signed   By: Audie Pinto M.D.   On: 01/20/2020 08:51   CT ABDOMEN PELVIS W CONTRAST  Result Date: 01/21/2020 CLINICAL DATA:  Recurrent retroperitoneal hemorrhage, hematuria, concern for renal mass EXAM: CT ABDOMEN AND PELVIS WITH CONTRAST TECHNIQUE: Multidetector CT imaging of the abdomen and pelvis was performed using the standard protocol following bolus administration of intravenous contrast. CONTRAST:  181mL OMNIPAQUE IOHEXOL 300 MG/ML  SOLN COMPARISON:  01/20/2020, 01/06/2020 FINDINGS: Lower chest: Hypoventilatory changes are seen at the lung bases. No acute pleural or parenchymal lung disease. Postsurgical changes from aortic and mitral valve replacements. Hepatobiliary: Gallbladder sludge layers dependently in the gallbladder. No cholecystitis. The liver is unremarkable. Pancreas: Unremarkable. No pancreatic ductal dilatation or surrounding inflammatory changes. Spleen: Diffuse splenic calcifications unchanged. Adrenals/Urinary Tract: There has been interval organization of the retroperitoneal hemorrhage seen previously. Retroperitoneal hematoma  along the ventral aspect of the left kidney measures approximately 8.0 x 8.8 cm in transverse dimension. There does appear to be and intraperitoneal extent of the organizing hematoma, contiguous with the retroperitoneal hematoma, measuring 7.7 x 6.9 cm in transverse dimension. In its entirety, the organizing hematoma extends approximately 17 cm in craniocaudal length. On this single phase exam, no underlying left renal mass can be observed. The kidneys are atrophic, with numerous hypodensities compatible with cysts, stable. The bladder is decompressed which limits its evaluation. The adrenals are unremarkable. Stomach/Bowel: No bowel obstruction or ileus. Normal appendix right lower quadrant. Scattered diverticulosis without diverticulitis. Vascular/Lymphatic: Aortic atherosclerosis.  No enlarged abdominal or pelvic lymph nodes. Reproductive: Calcified uterine fibroids are again noted. No adnexal masses. Other: No free fluid or free gas.  No abdominal wall hernia. 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 No acute or destructive bony lesions. IMPRESSION: 1. Organizing retroperitoneal and intraperitoneal hematoma as above. No evidence of acute hemorrhage. 2. Bilateral renal atrophy with numerous renal hypodensities as above. No definite etiology to explain the patient's hematuria or as a cause of previous retroperitoneal hemorrhage. If further evaluation of the kidneys is desired, follow-up multiphase dedicated renal CT or MRI may be useful after resolution of the organizing hematoma described above. 3. Diverticulosis without diverticulitis. 4. Biliary sludge.  No evidence of cholelithiasis or cholecystitis. 5.  Aortic Atherosclerosis (ICD10-I70.0). Electronically Signed   By: Randa Ngo M.D.   On: 01/21/2020 01:28   DG Chest Portable 1 View  Result Date: 01/06/2020 CLINICAL DATA:  59 year old female with central line placement. EXAM: PORTABLE CHEST 1 VIEW COMPARISON:   Chest radiograph dated 02/05/2019. FINDINGS: Left IJ central venous line tip close to the cavoatrial junction. There are minimal bibasilar atelectasis. No focal consolidation, pleural effusion, or pneumothorax. Stable cardiac silhouette. Median sternotomy wires and mechanical cardiac valve. Atherosclerotic calcification of the aorta. No acute osseous pathology. IMPRESSION: Left IJ central venous line with tip close to the cavoatrial junction. No pneumothorax. Electronically Signed   By: Anner Crete M.D.   On: 01/06/2020 22:08   CT RENAL STONE STUDY  Result Date: 01/06/2020 CLINICAL DATA:  Left flank pain EXAM: CT ABDOMEN AND PELVIS WITHOUT CONTRAST TECHNIQUE: Multidetector CT imaging of the abdomen and pelvis was performed following the standard protocol without IV contrast. COMPARISON:  01/06/2019 FINDINGS: Lower chest: Areas of linear consolidation at the lung bases likely reflect atelectasis or scarring. Postsurgical changes are seen from aortic and mitral valve replacements. Hepatobiliary: No focal liver abnormality is seen. No gallstones, gallbladder wall thickening, or biliary dilatation. Pancreas: The head and body of the pancreas are unremarkable. The tail the pancreas is difficult to visualize given an adjacent process within the left retroperitoneum. No definite intraparenchymal abnormalities. Spleen: Diffuse splenic calcifications unchanged. Adrenals/Urinary Tract: Right kidney is markedly atrophic with numerous small renal cortical cyst. The left kidney is obscured by a large heterogeneous process in the left renal fossa measuring 8.7 x 6.7 cm in transverse dimension, and measuring 6.7 cm in craniocaudal length. Differential includes renal hemorrhage or hemorrhagic renal neoplasm. Evaluation is limited without IV contrast. Fat stranding extends in the retroperitoneum inferior to the left kidney consistent with retroperitoneal hemorrhage. Stomach/Bowel: No bowel obstruction or ileus. Scattered  diverticulosis of the sigmoid colon without diverticulitis. Vascular/Lymphatic: Extensive vascular calcifications are seen throughout the aorta and its branches. No pathologic adenopathy. Reproductive: Calcified uterine fibroids are noted. No adnexal masses. Other: No free fluid or free gas.  No abdominal wall hernia. Musculoskeletal: Bones are diffusely sclerotic consistent with renal osteodystrophy and history of end-stage renal disease. No acute or destructive bony lesions. IMPRESSION: 1. Large left-sided retroperitoneal hematoma centered in the left renal fossa. Differential includes spontaneous perirenal hemorrhage versus hemorrhagic renal mass. 2. Diverticulosis without diverticulitis. 3.  Aortic Atherosclerosis (ICD10-I70.0). These results were called by telephone at the time of interpretation on 01/06/2020 at 4:39pm to provider DR. Ralene Bathe, who verbally acknowledged these results. Electronically Signed   By: Randa Ngo M.D.   On: 01/06/2020 16:47    Labs:  CBC: Recent Labs    01/23/20 0800 01/23/20 0800 01/24/20 0614 01/24/20 0350 01/25/20 0430  01/25/20 1822 01/26/20 0511 01/26/20 0747  WBC 7.0  --  6.6  --  9.1  --   --  10.5  HGB 10.7*   < > 9.5*   < > 9.7* 9.0* 8.5* 8.6*  HCT 34.1*   < > 30.0*   < > 30.6* 27.9* 27.3* 27.0*  PLT 275  --  236  --  236  --   --  218   < > = values in this interval not displayed.    COAGS: Recent Labs    01/23/20 0800 01/24/20 0614 01/25/20 0430 01/26/20 0511  INR 1.6* 1.7* 2.2* 2.9*    BMP: Recent Labs    01/23/20 0800 01/24/20 0614 01/25/20 0430 01/26/20 0511  NA 131* 131* 130* 135  K 4.0 4.3 4.8 3.7  CL 93* 94* 90* 95*  CO2 25 25 25 28   GLUCOSE 113* 80 90 99  BUN 26* 42* 51* 16  CALCIUM 10.2 9.9 10.1 9.2  CREATININE 5.61* 7.28* 9.34* 4.92*  GFRNONAA 8* 6* 4* 9*  GFRAA 9* 7* 5* 11*    LIVER FUNCTION TESTS: Recent Labs    01/06/20 0911 01/06/20 0911 01/07/20 0350 01/08/20 0500 01/23/20 0800 01/24/20 0614  01/25/20 0430 01/26/20 0511  BILITOT 0.5  --  0.2*  --   --   --   --   --   AST 28  --  30  --   --   --   --   --   ALT 19  --  17  --   --   --   --   --   ALKPHOS 110  --  80  --   --   --   --   --   PROT 8.4*  --  7.1  --   --   --   --   --   ALBUMIN 3.5   < > 3.3*   < > 3.2* 2.8* 2.9* 2.7*   < > = values in this interval not displayed.     Assessment and Plan:  Spontaneous left retroperitoneal bleed (in setting of Coumadin secondary to mechanical aortic/mitral valves) s/p 4 U PRBCs, vitamin K, and 2 U FFP; with interval increase in size of retroperitoneal hematoma, seen on CT abdomen/pelvis today. Patient with mild tachycardia but is otherwise hemodynamically stable. She is in need of anticoagulation given mechanical aortic/mitral valves. Case/images have been reviewed by Dr. Earleen Newport- there is a high likelihood that retroperitoneal hematoma is from left kidney. Given interval increase in size of retroperitoneal hematoma, recommend renal arteriogram to evaluate for active bleeding- will embolize at that time if active bleeding is noted (since she is stable no need for urgent/emergent arteriogram at this time). If bleeding is from left kidney, might need to embolize left kidney during procedure. Plan for image-guided renal arteriogram with possible embolization in IR tentatively for tomorrow 01/27/2020 pending IR scheduling.  Patient will be NPO at midnight. Afebrile. INR 2.9 today- per Dr. Earleen Newport, ideally INR < 2.0 prior to procedure to ensure safe embolization procedure, Dr. Wynelle Cleveland made aware.  Risks and benefits of renal arteriogram with intervention were discussed with the patient including, but not limited to bleeding, infection, vascular injury, contrast induced renal failure, stroke, reperfusion hemorrhage, or even death. This interventional procedure involves the use of X-rays and because of the nature of the planned procedure, it is possible that we will have prolonged use of X-ray  fluoroscopy. Potential radiation risks to you include (  but are not limited to) the following: - A slightly elevated risk for cancer  several years later in life. This risk is typically less than 0.5% percent. This risk is low in comparison to the normal incidence of human cancer, which is 33% for women and 50% for men according to the [place]. - Radiation induced injury can include skin redness, resembling a rash, tissue breakdown / ulcers and hair loss (which can be temporary or permanent).  The likelihood of either of these occurring depends on the difficulty of the procedure and whether you are sensitive to radiation due to previous procedures, disease, or genetic conditions.  IF your procedure requires a prolonged use of radiation, you will be notified and given written instructions for further action.  It is your responsibility to monitor the irradiated area for the 2 weeks following the procedure and to notify your physician if you are concerned that you have suffered a radiation induced injury.   All of the patient's questions were answered, patient is agreeable to proceed. Consent signed and in IR control room.   Thank you for this interesting consult.  I greatly enjoyed meeting Darlisha Kelm and look forward to participating in their care.  A copy of this report was sent to the requesting provider on this date.  Electronically Signed: Earley Abide, PA-C 01/26/2020, 2:14 PM   I spent a total of 40 Minutes in face to face in clinical consultation, greater than 50% of which was counseling/coordinating care for retroperitoneal bleed/renal arteriogram with possible embolization.

## 2020-01-27 ENCOUNTER — Inpatient Hospital Stay (HOSPITAL_COMMUNITY): Payer: Medicare HMO

## 2020-01-27 HISTORY — PX: IR US GUIDE VASC ACCESS RIGHT: IMG2390

## 2020-01-27 HISTORY — PX: IR EMBO ART  VEN HEMORR LYMPH EXTRAV  INC GUIDE ROADMAPPING: IMG5450

## 2020-01-27 HISTORY — PX: IR RENAL SELECTIVE  UNI INC S&I MOD SED: IMG654

## 2020-01-27 HISTORY — PX: IR ANGIOGRAM VISCERAL SELECTIVE: IMG657

## 2020-01-27 LAB — CBC
HCT: 20.6 % — ABNORMAL LOW (ref 36.0–46.0)
Hemoglobin: 6.6 g/dL — CL (ref 12.0–15.0)
MCH: 32.5 pg (ref 26.0–34.0)
MCHC: 32 g/dL (ref 30.0–36.0)
MCV: 101.5 fL — ABNORMAL HIGH (ref 80.0–100.0)
Platelets: 228 10*3/uL (ref 150–400)
RBC: 2.03 MIL/uL — ABNORMAL LOW (ref 3.87–5.11)
RDW: 16.4 % — ABNORMAL HIGH (ref 11.5–15.5)
WBC: 17.5 10*3/uL — ABNORMAL HIGH (ref 4.0–10.5)
nRBC: 0.2 % (ref 0.0–0.2)

## 2020-01-27 LAB — RENAL FUNCTION PANEL
Albumin: 2.5 g/dL — ABNORMAL LOW (ref 3.5–5.0)
Anion gap: 12 (ref 5–15)
BUN: 28 mg/dL — ABNORMAL HIGH (ref 6–20)
CO2: 27 mmol/L (ref 22–32)
Calcium: 9.5 mg/dL (ref 8.9–10.3)
Chloride: 90 mmol/L — ABNORMAL LOW (ref 98–111)
Creatinine, Ser: 7.04 mg/dL — ABNORMAL HIGH (ref 0.44–1.00)
GFR calc Af Amer: 7 mL/min — ABNORMAL LOW (ref >60)
GFR calc non Af Amer: 6 mL/min — ABNORMAL LOW (ref >60)
Glucose, Bld: 108 mg/dL — ABNORMAL HIGH (ref 70–99)
Phosphorus: 5.7 mg/dL — ABNORMAL HIGH (ref 2.5–4.6)
Potassium: 4.2 mmol/L (ref 3.5–5.1)
Sodium: 129 mmol/L — ABNORMAL LOW (ref 135–145)

## 2020-01-27 LAB — PROTIME-INR
INR: 1.4 — ABNORMAL HIGH (ref 0.8–1.2)
Prothrombin Time: 16.3 seconds — ABNORMAL HIGH (ref 11.4–15.2)

## 2020-01-27 LAB — PREPARE RBC (CROSSMATCH)

## 2020-01-27 LAB — HEMOGLOBIN AND HEMATOCRIT, BLOOD
HCT: 22.9 % — ABNORMAL LOW (ref 36.0–46.0)
Hemoglobin: 7.5 g/dL — ABNORMAL LOW (ref 12.0–15.0)

## 2020-01-27 MED ORDER — IOHEXOL 300 MG/ML  SOLN
150.0000 mL | Freq: Once | INTRAMUSCULAR | Status: AC | PRN
  Administered 2020-01-27: 60 mL via INTRA_ARTERIAL

## 2020-01-27 MED ORDER — FENTANYL CITRATE (PF) 100 MCG/2ML IJ SOLN
INTRAMUSCULAR | Status: AC | PRN
  Administered 2020-01-27: 25 ug via INTRAVENOUS

## 2020-01-27 MED ORDER — SODIUM CHLORIDE 0.9% IV SOLUTION: Freq: Once | INTRAVENOUS | Status: DC

## 2020-01-27 MED ORDER — MIDAZOLAM HCL 2 MG/2ML IJ SOLN
INTRAMUSCULAR | Status: AC | PRN
  Administered 2020-01-27: 1 mg via INTRAVENOUS

## 2020-01-27 MED ORDER — HEPARIN (PORCINE) 25000 UT/250ML-% IV SOLN
1100.0000 [IU]/h | INTRAVENOUS | Status: DC
  Administered 2020-01-27 – 2020-01-28 (×2): 950 [IU]/h via INTRAVENOUS
  Administered 2020-01-30 – 2020-01-31 (×3): 1100 [IU]/h via INTRAVENOUS
  Administered 2020-02-01 – 2020-02-05 (×5): 1200 [IU]/h via INTRAVENOUS
  Administered 2020-02-06: 1250 [IU]/h via INTRAVENOUS
  Administered 2020-02-07: 1200 [IU]/h via INTRAVENOUS
  Administered 2020-02-07: 1250 [IU]/h via INTRAVENOUS
  Administered 2020-02-08: 1200 [IU]/h via INTRAVENOUS
  Administered 2020-02-09: 1100 [IU]/h via INTRAVENOUS
  Administered 2020-02-10: 1150 [IU]/h via INTRAVENOUS
  Administered 2020-02-11: 1200 [IU]/h via INTRAVENOUS
  Filled 2020-01-27 (×19): qty 250

## 2020-01-27 MED ORDER — FENTANYL CITRATE (PF) 100 MCG/2ML IJ SOLN
INTRAMUSCULAR | Status: AC
  Filled 2020-01-27: qty 2

## 2020-01-27 MED ORDER — MORPHINE SULFATE (PF) 4 MG/ML IV SOLN
INTRAVENOUS | Status: AC
Start: 2020-01-27 — End: 2020-01-27
  Administered 2020-01-27: 4 mg
  Filled 2020-01-27: qty 1

## 2020-01-27 MED ORDER — LIDOCAINE HCL 1 % IJ SOLN
INTRAMUSCULAR | Status: AC | PRN
  Administered 2020-01-27: 10 mL

## 2020-01-27 MED ORDER — MIDAZOLAM HCL 2 MG/2ML IJ SOLN
INTRAMUSCULAR | Status: AC
  Filled 2020-01-27: qty 2

## 2020-01-27 MED ORDER — LIDOCAINE HCL 1 % IJ SOLN
INTRAMUSCULAR | Status: AC
  Filled 2020-01-27: qty 20

## 2020-01-27 MED ORDER — IOHEXOL 300 MG/ML  SOLN: 150.0000 mL | Freq: Once | INTRAMUSCULAR | Status: DC | PRN

## 2020-01-27 MED ORDER — HEPARIN (PORCINE) 25000 UT/250ML-% IV SOLN: 950.0000 [IU]/h | INTRAVENOUS | Status: DC

## 2020-01-27 NOTE — Procedures (Signed)
Interventional Radiology Procedure Note  Procedure: Left renal angiogram and coil embolization  Complications: None  Estimated Blood Loss: None  Recommendations: - Bedrest right leg straight x 4 hrs - Manage post embo pain from left renal infarction   Signed,  Criselda Peaches, MD

## 2020-01-27 NOTE — Progress Notes (Signed)
PT Cancellation Note  Patient Details Name: [name] MRN: [id number] DOB: [birth year]   Cancelled Treatment:    Reason Eval/Treat Not Completed: Patient at procedure or test/unavailable (Pt in HD. Will return as able. )   [name] 01/27/2020, 8:37 AM [name],PT Acute Rehabilitation Services Pager:  [phone]  Office:  [phone]

## 2020-01-27 NOTE — Progress Notes (Signed)
Subjective: on HD this am, nausea/ pain persist, on sched for IR procedure later today, getting  2u prbc w hd  Objective Vital signs in last 24 hours: Vitals:   01/27/20 1100 01/27/20 1101 01/27/20 1103 01/27/20 1200  BP: 106/62 133/73 (!) 110/56 98/60  Pulse: (!) 129 (!) 121 (!) 137 (!) 122  Resp:  (!) 22    Temp:  (!) 97.2 F (36.2 C)    TempSrc:  Oral    SpO2:  100%    Weight:  63.6 kg    Height:       Weight change: 0.4 kg  Physical Exam: General: alert, not in distress Heart: RRR, 2/6 SEM, mechanical heart sound Lungs: CTA bilaterally nonlabored breathing Abdomen: Bowel sounds positive, nondistended, tender left flank Extremities: No lower extremity edema Dialysis Access: RUA aVF+ bruit   OP HD: MWF    4h  61kg  450/800   2/2 bath  P2  Hep 1800  -sensipar 180mg   -calcitriol 33mcg  -no esa, no iron   Problem/Plan: 1.L retroperitoneal perirenal bleed / hematoma -left flank pain this a.m. room RN calling admit team, Bld seen on CT 8/18. S/p 2 units FFP, 4 units pRBC total and Vit K. IV heparin started 8/21 and stopped 8/22, given more pRBC. IV hep and coumadin restarted 8/23.Seen by Urology, if rebleeds plan is to embolize L kidney. Rpt CT Abd 9/02 w/ IV contrast identified an organizing hematoma, no renal mass could be seen - pt's L flank pain has recurred x last 2-3 days here, Hb dropped to 8's and today into the 6's, IV hep stopped yest  - INR reversed w/ IV vit K 5mg  yest > 1.4 today  - IR planning embolization procedure later after HD today  2. Rectal bleeding- resolved 3.Hx AVR/MVR- on chronic anticoagulation with warfarin, on hold for now 4. ESRD- on HD MWF. HD today, 2u prbc. No Heparin.  5. Anemiaof CKD- As above S/p 6 units pRBC. TSAT 84% 8/30.  6. Secondary hyperparathyroidism- Phos at goal. 3.4 cont low Ca bath. . Continue VDRA and sensipar. Noted on Auryxia 840 mg with phosphorus controlled in hospital and with high TSAT level but transfused  will decrease to 420 G follow up trend no changes for now 7.HTN/volume- BP stable, UF to 61kg tomorrow  8. Nutrition- Renal diet w/fluid restrictions. Alb 2.8. Protein supplements.    [name], MD 01/27/2020, 1:30 PM      Labs: Basic Metabolic Panel: Recent Labs  Lab 01/25/20 0430 01/26/20 0511 01/27/20 0337  NA 130* 135 129*  K 4.8 3.7 4.2  CL 90* 95* 90*  CO2 25 28 27   GLUCOSE 90 99 108*  BUN 51* 16 28*  CREATININE 9.34* 4.92* 7.04*  CALCIUM 10.1 9.2 9.5  PHOS 4.9* 3.8 5.7*   Liver Function Tests: Recent Labs  Lab 01/25/20 0430 01/26/20 0511 01/27/20 0337  ALBUMIN 2.9* 2.7* 2.5*   No results for input(s): LIPASE, AMYLASE in the last 168 hours. No results for input(s): AMMONIA in the last 168 hours. CBC: Recent Labs  Lab 01/23/20 0800 01/23/20 0800 01/24/20 0614 01/24/20 0614 01/25/20 0430 01/25/20 1822 01/26/20 0511 01/26/20 0747 01/27/20 0455  WBC 7.0   < > 6.6   < > 9.1  --   --  10.5 17.5*  HGB 10.7*   < > 9.5*   < > 9.7*   < > 8.5* 8.6* 6.6*  HCT 34.1*   < > 30.0*   < > 30.6*   < >  27.3* 27.0* 20.6*  MCV 99.1  --  98.7  --  98.1  --   --  100.7* 101.5*  PLT 275   < > 236   < > 236  --   --  218 228   < > = values in this interval not displayed.   Cardiac Enzymes: No results for input(s): CKTOTAL, CKMB, CKMBINDEX, TROPONINI in the last 168 hours. CBG: No results for input(s): GLUCAP in the last 168 hours.  Studies/Results: CT ABDOMEN PELVIS WO CONTRAST  Result Date: 01/26/2020 CLINICAL DATA:  Anemia, evaluate for retroperitoneal bleed. EXAM: CT ABDOMEN AND PELVIS WITHOUT CONTRAST TECHNIQUE: Multidetector CT imaging of the abdomen and pelvis was performed following the standard protocol without IV contrast. COMPARISON:  CT abdomen pelvis 01/21/2020, CT pelvis 01/06/2019 FINDINGS: Lower chest: Interval increase in right lower lobe consolidation with air bronchograms. Bibasilar linear atelectasis versus scarring. Aortic and mitral valve  replacements. Hepatobiliary: No focal liver abnormality is seen. Layering hyperdensity within the gallbladder lumen may represent sludge versus tiny gallstones. No gallbladder wall thickening or biliary dilatation. Pancreas: Unremarkable. No pancreatic ductal dilatation or surrounding inflammatory changes. Spleen: Diffuse punctate splenic calcifications likely related to prior granulomatous disease. Adrenals/Urinary Tract: Interval increase in size large heterogenous retroperitoneal hematoma along the ventral aspect of the left kidney that is difficult to measure on this noncontrast study with the largest dimensions of 11.4 X 10 x 15.3 cm (3:39, 6:89) (from 7.9 x 8.8 x 11.1 cm) with and again intraperitoneal extent with the largest contiguous portion measuring 7.5 x 5.7 x 11.5 cm (3:54, 6:51) (from 7.7 x 6.9 x 11.4cm). Interval loss of fat plane between the left psoas muscle and the left hematoma. Bilateral renal atrophy and cortical scarring. Redemonstration of several subcentimeter hypodensities within the kidneys that are too small to characterize. Stomach/Bowel: Oral contrast reaches the rectum. Stomach is within normal limits. Appendix appears normal. No evidence of bowel wall thickening, distention, or inflammatory changes. Scattered colonic diverticulosis. Mild circumferential bowel wall thickening of the descending colon and rectosigmoid colon with no definite pericolonic fat stranding. Vascular/Lymphatic: No abdominal aorta aneurysm. Severe atherosclerotic plaque of the aorta and its branches. No enlarged abdominal or pelvic lymph nodes. Reproductive: Couple degenerative calcified fibroids within the uterus. Otherwise the uterus and bilateral adnexa are unremarkable. Other: No abdominal wall hernia or abnormality. No simple free fluid ascites. No free intraperitoneal gas. Musculoskeletal: No acute or significant osseous findings. Degenerative changes of the spine and bilateral hips. IMPRESSION: Interval  increase in size of a large left 11 x 10 x 15cm retroperitoneal hematoma with intraperitoneal extent. Interval increase in right lower lobe consolidation which may represent a combination of atelectasis versus infection/inflammation. Colonic diverticulosis with no acute diverticulitis. Biliary sludge versus tiny gallstones with CT evidence of no acute cholecystitis. Electronically Signed   By: Iven Finn M.D.   On: 01/26/2020 11:51   Medications:  sodium chloride      sodium chloride   Intravenous Once   sodium chloride   Intravenous Once   calcitRIOL  2 mcg Oral Q M,W,F   Chlorhexidine Gluconate Cloth  6 each Topical Q0600   cinacalcet  180 mg Oral Q M,W,F-HD   docusate sodium  100 mg Oral Daily   ferric citrate  420 mg Oral TID WC   metoprolol tartrate  25 mg Oral BID   multivitamin  1 tablet Oral QHS   pantoprazole  40 mg Oral QAC breakfast   polyethylene glycol  17 g Oral BID  senna  1 tablet Oral Daily   simethicone  80 mg Oral TID   Warfarin - Pharmacist Dosing Inpatient   Does not apply [id number]

## 2020-01-27 NOTE — Progress Notes (Signed)
PROGRESS NOTE    [name]  ZOX:[id number] DOB: 10-Apr-1961 DOA: 01/06/2020 PCP: Benito Mccreedy, MD   Brief Narrative: Connie Kilbourne Milesis an 59 y.o.female withpast medical history of end-stage renal disease on hemodialysis Monday Wednesday and Friday status post aortic and mitral valve replacement on Coumadin who presents with nausea and nonbloody emesis, She was found to have a spontaneous left retroperitoneal bleed She status post 4 units of packed red blood cells,  2 units of fresh frozen plasma and vitamin K.  Her hemoglobin continues to drop.  She is going to get 2 units PRBCs during hemodialysis today.  Her heparin is on hold, INR is 1.4, patient is scheduled to have IR guided angiogram and possible embolization of the bleeding artery today.   Assessment & Plan:   Principal Problem:   Retroperitoneal hemorrhage Active Problems:   End-stage renal disease on hemodialysis (HCC)   H/O heart valve replacement with mechanical valve   S/P mitral valve replacement with metallic valve   Nontraumatic retroperitoneal hematoma   Status post mechanical aortic valve replacement     Retroperitoneal hemorrhage- mechanical aortic and mitral valve - this appears to have been a spontaneous hemorrhage - s/p 4 U PRBC, Vit K and 2 U FFP.  -  It has resolved and Coumadin was resumed with Heparin bridge on 8/23 - ultrasound of abdomen reveals stable retroperitoneal bleed. - 9/2> CT abd/pelvis shows a stable hematoma. - patient had recurrent severe left flank pain which she states feels similar to the pain she had when she was found to have the RP hemorrhage -  Dilaudid IV for her pain was ordered and increased her Oxycodone - Hb dropped to 8  yesterday - reordered a CT scan and her hematoma does appear larger than before thus, I consulted IR for an embolization - per IR, it is difficult to tell if she is still bleeding and thus IR will do an angiogram today- Dr Earleen Newport requests that we  decrease her INR to below 2.0 for the angiogram today - She was given Vitamin K 5 mg yesterday - cont to follow Hemoglobin closely and transfuse if < 7.  - Her hemoglobin dropped to 6.6, She is going to get 2 units of PRBCs during hemodialysis. -She is a scheduled to have angiogram followed by embolization of bleeding artery by IR today.   Nausea and Vomiting - patient claims the pain is causing this but it may also be the Dilauidid- continue PRN Zofran  Gross hematuria>>> Improving  - obtained CT abd./pelvis to see if a source of bleeding can be found but this was unremarkable. - current amount of blood is small. -  Urology consulted if she continues to have this issue when close to d/c,  - She will make follow up appointment for urology to investigate this further     End-stage renal disease on hemodialysis  Secondary hyperparathyroidism - cont HD on MWF   DVT prophylaxis: Heparin/ coumadin Code Status: Full Family Communication: No family at bed side. Disposition Plan:  Dispo: The patient is from: Home  Anticipated d/c is to: Home  Anticipated d/c date is: 3 days  Patient currently is not medically stable to d/c.   Consultants:    Nephrology, Cardiology, Urology, IR  Procedures:   Antimicrobials: Anti-infectives (From admission, onward)   None     Subjective: Patient was seen and examined in hemodialysis unit.  Overnight events noted.  She still reports feeling weak, fatigue still reports having significant  amount of flank pain.  Objective: Vitals:   01/27/20 1101 01/27/20 1103 01/27/20 1200 01/27/20 1345  BP: 133/73 (!) 110/56 98/60 (!) 91/59  Pulse: (!) 121 (!) 137 (!) 122 (!) 122  Resp: (!) 22   16  Temp: (!) 97.2 F (36.2 C)   98.3 F (36.8 C)  TempSrc: Oral   Oral  SpO2: 100%   100%  Weight: 63.6 kg     Height:        Intake/Output Summary (Last 24 hours) at 01/27/2020 1435 Last data filed at 01/27/2020  1101 Gross per 24 hour  Intake 615 ml  Output 1038 ml  Net -423 ml   Filed Weights   01/27/20 0036 01/27/20 0755 01/27/20 1101  Weight: 63 kg 63.3 kg 63.6 kg    Examination:  General exam: Appears calm and comfortable  Respiratory system: Clear to auscultation. Respiratory effort normal. Cardiovascular system: S1 & S2 heard, RRR. No JVD, murmurs, rubs, gallops or clicks. No pedal edema. Gastrointestinal system: Abdomen is nondistended, soft and nontender. No organomegaly or masses felt. Normal bowel sounds heard. Central nervous system: Alert and oriented. No focal neurological deficits. Extremities:  No leg edema, no cyanosis, no clubbing. Skin: No rashes, lesions or ulcers Psychiatry: Judgement and insight appear normal. Mood & affect appropriate.     Data Reviewed: I have personally reviewed following labs and imaging studies  CBC: Recent Labs  Lab 01/23/20 0800 01/23/20 0800 01/24/20 0614 01/24/20 0614 01/25/20 0430 01/25/20 1822 01/26/20 0511 01/26/20 0747 01/27/20 0455  WBC 7.0  --  6.6  --  9.1  --   --  10.5 17.5*  HGB 10.7*   < > 9.5*   < > 9.7* 9.0* 8.5* 8.6* 6.6*  HCT 34.1*   < > 30.0*   < > 30.6* 27.9* 27.3* 27.0* 20.6*  MCV 99.1  --  98.7  --  98.1  --   --  100.7* 101.5*  PLT 275  --  236  --  236  --   --  218 228   < > = values in this interval not displayed.   Basic Metabolic Panel: Recent Labs  Lab 01/23/20 0800 01/24/20 0614 01/25/20 0430 01/26/20 0511 01/27/20 0337  NA 131* 131* 130* 135 129*  K 4.0 4.3 4.8 3.7 4.2  CL 93* 94* 90* 95* 90*  CO2 25 25 25 28 27   GLUCOSE 113* 80 90 99 108*  BUN 26* 42* 51* 16 28*  CREATININE 5.61* 7.28* 9.34* 4.92* 7.04*  CALCIUM 10.2 9.9 10.1 9.2 9.5  PHOS 3.4 3.4 4.9* 3.8 5.7*   GFR: Estimated Creatinine Clearance: 8.3 mL/min (A) (by C-G formula based on SCr of 7.04 mg/dL (H)). Liver Function Tests: Recent Labs  Lab 01/23/20 0800 01/24/20 0614 01/25/20 0430 01/26/20 0511 01/27/20 0337  ALBUMIN  3.2* 2.8* 2.9* 2.7* 2.5*   No results for input(s): LIPASE, AMYLASE in the last 168 hours. No results for input(s): AMMONIA in the last 168 hours. Coagulation Profile: Recent Labs  Lab 01/23/20 0800 01/24/20 0614 01/25/20 0430 01/26/20 0511 01/27/20 0337  INR 1.6* 1.7* 2.2* 2.9* 1.4*   Cardiac Enzymes: No results for input(s): CKTOTAL, CKMB, CKMBINDEX, TROPONINI in the last 168 hours. BNP (last 3 results) No results for input(s): PROBNP in the last 8760 hours. HbA1C: No results for input(s): HGBA1C in the last 72 hours. CBG: No results for input(s): GLUCAP in the last 168 hours. Lipid Profile: No results for input(s): CHOL, HDL, LDLCALC, TRIG, CHOLHDL,  LDLDIRECT in the last 72 hours. Thyroid Function Tests: No results for input(s): TSH, T4TOTAL, FREET4, T3FREE, THYROIDAB in the last 72 hours. Anemia Panel: No results for input(s): VITAMINB12, FOLATE, FERRITIN, TIBC, IRON, RETICCTPCT in the last 72 hours. Sepsis Labs: No results for input(s): PROCALCITON, LATICACIDVEN in the last 168 hours.  No results found for this or any previous visit (from the past 240 hour(s)).   Radiology Studies: CT ABDOMEN PELVIS WO CONTRAST  Result Date: 01/26/2020 CLINICAL DATA:  Anemia, evaluate for retroperitoneal bleed. EXAM: CT ABDOMEN AND PELVIS WITHOUT CONTRAST TECHNIQUE: Multidetector CT imaging of the abdomen and pelvis was performed following the standard protocol without IV contrast. COMPARISON:  CT abdomen pelvis 01/21/2020, CT pelvis 01/06/2019 FINDINGS: Lower chest: Interval increase in right lower lobe consolidation with air bronchograms. Bibasilar linear atelectasis versus scarring. Aortic and mitral valve replacements. Hepatobiliary: No focal liver abnormality is seen. Layering hyperdensity within the gallbladder lumen may represent sludge versus tiny gallstones. No gallbladder wall thickening or biliary dilatation. Pancreas: Unremarkable. No pancreatic ductal dilatation or surrounding  inflammatory changes. Spleen: Diffuse punctate splenic calcifications likely related to prior granulomatous disease. Adrenals/Urinary Tract: Interval increase in size large heterogenous retroperitoneal hematoma along the ventral aspect of the left kidney that is difficult to measure on this noncontrast study with the largest dimensions of 11.4 X 10 x 15.3 cm (3:39, 6:89) (from 7.9 x 8.8 x 11.1 cm) with and again intraperitoneal extent with the largest contiguous portion measuring 7.5 x 5.7 x 11.5 cm (3:54, 6:51) (from 7.7 x 6.9 x 11.4cm). Interval loss of fat plane between the left psoas muscle and the left hematoma. Bilateral renal atrophy and cortical scarring. Redemonstration of several subcentimeter hypodensities within the kidneys that are too small to characterize. Stomach/Bowel: Oral contrast reaches the rectum. Stomach is within normal limits. Appendix appears normal. No evidence of bowel wall thickening, distention, or inflammatory changes. Scattered colonic diverticulosis. Mild circumferential bowel wall thickening of the descending colon and rectosigmoid colon with no definite pericolonic fat stranding. Vascular/Lymphatic: No abdominal aorta aneurysm. Severe atherosclerotic plaque of the aorta and its branches. No enlarged abdominal or pelvic lymph nodes. Reproductive: Couple degenerative calcified fibroids within the uterus. Otherwise the uterus and bilateral adnexa are unremarkable. Other: No abdominal wall hernia or abnormality. No simple free fluid ascites. No free intraperitoneal gas. Musculoskeletal: No acute or significant osseous findings. Degenerative changes of the spine and bilateral hips. IMPRESSION: Interval increase in size of a large left 11 x 10 x 15cm retroperitoneal hematoma with intraperitoneal extent. Interval increase in right lower lobe consolidation which may represent a combination of atelectasis versus infection/inflammation. Colonic diverticulosis with no acute diverticulitis.  Biliary sludge versus tiny gallstones with CT evidence of no acute cholecystitis. Electronically Signed   By: Iven Finn M.D.   On: 01/26/2020 11:51    Scheduled Meds: . sodium chloride   Intravenous Once  . sodium chloride   Intravenous Once  . calcitRIOL  2 mcg Oral Q M,W,F  . Chlorhexidine Gluconate Cloth  6 each Topical Q0600  . cinacalcet  180 mg Oral Q M,W,F-HD  . docusate sodium  100 mg Oral Daily  . ferric citrate  420 mg Oral TID WC  . metoprolol tartrate  25 mg Oral BID  . multivitamin  1 tablet Oral QHS  . pantoprazole  40 mg Oral QAC breakfast  . polyethylene glycol  17 g Oral BID  . senna  1 tablet Oral Daily  . simethicone  80 mg Oral TID  .  Warfarin - Pharmacist Dosing Inpatient   Does not apply q1600   Continuous Infusions: . sodium chloride       LOS: 21 days    Time spent: 35 mins.    Shawna Clamp, MD [facility]   If 7PM-7AM, please contact night-coverage

## 2020-01-27 NOTE — Progress Notes (Signed)
   01/27/20 0510  Provider Notification  Provider Name/Title DR. Zierle-Ghosh  Date Provider Notified 01/27/20  Time Provider Notified 0510  Notification Type Page  Notification Reason Other (Comment) (hgb=6.6)  Response See new orders  Date of Provider Response 01/27/20  Time of Provider Response [phone]

## 2020-01-27 NOTE — Progress Notes (Signed)
ANTICOAGULATION CONSULT NOTE - Follow Up Consult  Pharmacy Consult for Heparin, Warfarin Indication: artificial heart valves (aortic, mitral)  HPI:S/P RP hematoma. On warfarin PTA for hx mechanical AVR/MVR (goal 2.5-3.5), reversed with vit K 10 mg IV + 2 units FFP  + 4 units PRBcs on 8/18.  PTA Warfarin regimen: 9 mg daily except 6 mg on Mondays - last 8/18 (INR was 3.6 on admit 8/18)  Heparin level was 0.28 on heparin infusion at 900 units/hr on 9/7 AM.  INR was 2.9, therapeutic on 9/7, goal INR 2.5-3.5; heparin was discontinued on 9/7. Warfarin was held on 9/7 and vit K 5 mg IV X 1 was given on 9/7, in prep for IR procedure today (L renal angiogram and coil embolization); INR was 1.4 today. Pt now S/P procedure; per Dr. Laurence Ferrari, okay to restart heparin infusion 90 minutes after conclusion of procedure (procedure note written at 1755 PM). Per Dr. Dwyane Dee, can restart warfarin tomorrow (9//9/21).  H/H today on AM labs: 6/620.6, plt 228. Per RN, no bleeding issues post IR procedure.  Goal of Therapy:  INR 2.5-3.5 Heparin level 0.3-0.5 units/ml  Monitor platelets by anticoagulation protocol: Yes   Plan:  Restart heparin infusion at 950 units/hr ~90 mins after completion of IR procedure (restart at [place] PM this evening) Check heparin level 8 hrs after restarting heparin infusion Monitor daily INR, heparin level, CBC Monitor for signs/symptoms of bleeding   Gillermina Hu, PharmD, BCPS, [facility] Clinical Pharmacist 01/27/2020 6:19 PM

## 2020-01-28 LAB — PROTIME-INR
INR: 1.2 (ref 0.8–1.2)
Prothrombin Time: 14.4 seconds (ref 11.4–15.2)

## 2020-01-28 LAB — RENAL FUNCTION PANEL
Albumin: 2.4 g/dL — ABNORMAL LOW (ref 3.5–5.0)
Anion gap: 12 (ref 5–15)
BUN: 16 mg/dL (ref 6–20)
CO2: 28 mmol/L (ref 22–32)
Calcium: 8.2 mg/dL — ABNORMAL LOW (ref 8.9–10.3)
Chloride: 93 mmol/L — ABNORMAL LOW (ref 98–111)
Creatinine, Ser: 4.83 mg/dL — ABNORMAL HIGH (ref 0.44–1.00)
GFR calc Af Amer: 11 mL/min — ABNORMAL LOW (ref >60)
GFR calc non Af Amer: 9 mL/min — ABNORMAL LOW (ref >60)
Glucose, Bld: 119 mg/dL — ABNORMAL HIGH (ref 70–99)
Phosphorus: 4.4 mg/dL (ref 2.5–4.6)
Potassium: 3.6 mmol/L (ref 3.5–5.1)
Sodium: 133 mmol/L — ABNORMAL LOW (ref 135–145)

## 2020-01-28 LAB — CBC
HCT: 21.1 % — ABNORMAL LOW (ref 36.0–46.0)
Hemoglobin: 6.8 g/dL — CL (ref 12.0–15.0)
MCH: 30.9 pg (ref 26.0–34.0)
MCHC: 32.2 g/dL (ref 30.0–36.0)
MCV: 95.9 fL (ref 80.0–100.0)
Platelets: 214 10*3/uL (ref 150–400)
RBC: 2.2 MIL/uL — ABNORMAL LOW (ref 3.87–5.11)
RDW: 20.2 % — ABNORMAL HIGH (ref 11.5–15.5)
WBC: 12.6 10*3/uL — ABNORMAL HIGH (ref 4.0–10.5)
nRBC: 0.2 % (ref 0.0–0.2)

## 2020-01-28 LAB — HEMOGLOBIN AND HEMATOCRIT, BLOOD
HCT: 25.1 % — ABNORMAL LOW (ref 36.0–46.0)
HCT: 24.5 % — ABNORMAL LOW (ref 36.0–46.0)
Hemoglobin: 8.2 g/dL — ABNORMAL LOW (ref 12.0–15.0)
Hemoglobin: 7.9 g/dL — ABNORMAL LOW (ref 12.0–15.0)

## 2020-01-28 LAB — PREPARE RBC (CROSSMATCH)

## 2020-01-28 LAB — MAGNESIUM: Magnesium: 1.8 mg/dL (ref 1.7–2.4)

## 2020-01-28 LAB — HEPARIN LEVEL (UNFRACTIONATED): Heparin Unfractionated: 0.23 IU/mL — ABNORMAL LOW (ref 0.30–0.70)

## 2020-01-28 MED ORDER — SODIUM CHLORIDE 0.9% IV SOLUTION: Freq: Once | INTRAVENOUS | Status: AC

## 2020-01-28 NOTE — Progress Notes (Signed)
PROGRESS NOTE    [name]  KCL:[id number] DOB: [birth year] DOA: 01/06/2020 PCP: Benito Mccreedy, MD   Brief Narrative: [name] an 59 y.o.female withpast medical history of end-stage renal disease on hemodialysis Monday Wednesday and Friday status post aortic and mitral valve replacement on Coumadin who presents with nausea and nonbloody emesis, She was found to have a spontaneous left retroperitoneal bleed. She status post 6 units of packed red blood cells,  2 units of fresh frozen plasma and vitamin K.  Her hemoglobin continues to drop. She underwent  IR guided angiogram and embolization of the bleeding artery 01/27/20. She is on heparin gtt , Coumadin is on hold, Her Hb still dropped overnight to 6.8, she has received 2 more units PRBC, Hb 8.2  Plan: will continue to monitor H and H and resume coumadin.   Assessment & Plan:   Principal Problem:   Retroperitoneal hemorrhage Active Problems:   End-stage renal disease on hemodialysis (HCC)   H/O heart valve replacement with mechanical valve   S/P mitral valve replacement with metallic valve   Nontraumatic retroperitoneal hematoma   Status post mechanical aortic valve replacement     Retroperitoneal hemorrhage- mechanical aortic and mitral valve - this appears to have been a spontaneous hemorrhage. - s/p 6 U PRBC, Vit K and 2 U FFP.  -  It has resolved and Coumadin was resumed with Heparin bridge on 8/23 - ultrasound of abdomen reveals stable retroperitoneal bleed. - 9/2> CT abd/pelvis shows a stable hematoma. - patient had recurrent severe left flank pain which she states feels similar to the pain she had when she was found to have the RP hemorrhage -  Dilaudid IV for her pain was ordered and increased her Oxycodone - Hb dropped to 8  9/3  - reordered a CT scan and her hematoma does appear larger than before thus, consulted IR for an embolization - per IR, it is difficult to tell if she is still bleeding , Dr  Earleen Newport requests that we decrease her INR to below 2.0 for the angiogram  - She was given Vitamin K 5 mg  - cont to follow Hemoglobin closely and transfuse if < 7.  - She underwent angiogram followed by embolization of bleeding artery by IR  01/27/20, tolerated well - Her hb dropped to 6.8 despite embolization. She received 2 more units PRBC  Hb 8.2 - Continue heparin gtt, coumadin is on hold, monitor H and H.  - repeat CT A/P if her Hb continues to drop and recall IR.   Nausea and Vomiting - Improved - patient claims the pain is causing this but it may also be the Dilauidid- continue PRN Zofran  Gross hematuria>>> Improving  - obtained CT abd./pelvis to see if a source of bleeding can be found but this was unremarkable. - current amount of blood is small. -  Urology consulted if she continues to have this issue when close to d/c,  - She will make follow up appointment for urology to investigate this further     End-stage renal disease on hemodialysis  Secondary hyperparathyroidism - cont HD on MWF   DVT prophylaxis: Heparin gtt / coumadin on hold. Code Status: Full Family Communication: No family at bed side. Disposition Plan:  Dispo: The patient is from: Home  Anticipated d/c is to: Home  Anticipated d/c date is: 3 days  Patient currently is not medically stable to d/c. Ongoing retroperitoneal bleeding   Consultants:    Nephrology, Cardiology,  Urology, IR  Procedures:   Antimicrobials: Anti-infectives (From admission, onward)   None     Subjective: Patient was seen and examined in the morning..  Overnight events noted.  She was brushing her teeth. She underwent angiogram and embolization of bleeding artery. Her Hb dropped despite, still reports having mild left flank pain.  She denies N/V/D. Objective: Vitals:   01/28/20 0606 01/28/20 0759 01/28/20 0815 01/28/20 1158  BP: 116/65 118/73 115/71 113/73  Pulse: 89 90 92 83  Resp:  16 14  16   Temp: 98.2 F (36.8 C) 99 F (37.2 C) 98.2 F (36.8 C) 98.8 F (37.1 C)  TempSrc: Oral Oral Oral Oral  SpO2: 100% 100% 100% 100%  Weight:      Height:        Intake/Output Summary (Last 24 hours) at 01/28/2020 1407 Last data filed at 01/28/2020 0815 Gross per 24 hour  Intake 1032.71 ml  Output --  Net 1032.71 ml   Filed Weights   01/27/20 0755 01/27/20 1101 01/28/20 0455  Weight: 63.3 kg 63.6 kg 62.5 kg    Examination:  General exam: Appears calm and comfortable. Brushing her teeth. Respiratory system: Clear to auscultation. Respiratory effort normal. Cardiovascular system: S1 & S2 heard, RRR. No JVD, murmurs, rubs, gallops or clicks. No pedal edema. Gastrointestinal system: Abdomen is nondistended, soft and mildly tender in left flank.  No organomegaly or masses felt. Normal bowel sounds heard. Central nervous system: Alert and oriented. No focal neurological deficits. Extremities:  No leg edema, no cyanosis, no clubbing. Skin: No rashes, lesions or ulcers Psychiatry: Judgement and insight appear normal. Mood & affect appropriate.     Data Reviewed: I have personally reviewed following labs and imaging studies  CBC: Recent Labs  Lab 01/24/20 0614 01/24/20 0614 01/25/20 0430 01/25/20 1822 01/26/20 0747 01/27/20 0455 01/27/20 1853 01/28/20 0407 01/28/20 1312  WBC 6.6  --  9.1  --  10.5 17.5*  --  12.6*  --   HGB 9.5*   < > 9.7*   < > 8.6* 6.6* 7.5* 6.8* 8.2*  HCT 30.0*   < > 30.6*   < > 27.0* 20.6* 22.9* 21.1* 25.1*  MCV 98.7  --  98.1  --  100.7* 101.5*  --  95.9  --   PLT 236  --  236  --  218 228  --  214  --    < > = values in this interval not displayed.   Basic Metabolic Panel: Recent Labs  Lab 01/24/20 0614 01/25/20 0430 01/26/20 0511 01/27/20 0337 01/28/20 0407  NA 131* 130* 135 129* 133*  K 4.3 4.8 3.7 4.2 3.6  CL 94* 90* 95* 90* 93*  CO2 25 25 28 27 28   GLUCOSE 80 90 99 108* 119*  BUN 42* 51* 16 28* 16  CREATININE 7.28* 9.34*  4.92* 7.04* 4.83*  CALCIUM 9.9 10.1 9.2 9.5 8.2*  MG  --   --   --   --  1.8  PHOS 3.4 4.9* 3.8 5.7* 4.4   GFR: Estimated Creatinine Clearance: 12 mL/min (A) (by C-G formula based on SCr of 4.83 mg/dL (H)). Liver Function Tests: Recent Labs  Lab 01/24/20 0614 01/25/20 0430 01/26/20 0511 01/27/20 0337 01/28/20 0407  ALBUMIN 2.8* 2.9* 2.7* 2.5* 2.4*   No results for input(s): LIPASE, AMYLASE in the last 168 hours. No results for input(s): AMMONIA in the last 168 hours. Coagulation Profile: Recent Labs  Lab 01/24/20 7262 01/25/20 0430 01/26/20 0355 01/27/20 9741  01/28/20 0407  INR 1.7* 2.2* 2.9* 1.4* 1.2   Cardiac Enzymes: No results for input(s): CKTOTAL, CKMB, CKMBINDEX, TROPONINI in the last 168 hours. BNP (last 3 results) No results for input(s): PROBNP in the last 8760 hours. HbA1C: No results for input(s): HGBA1C in the last 72 hours. CBG: No results for input(s): GLUCAP in the last 168 hours. Lipid Profile: No results for input(s): CHOL, HDL, LDLCALC, TRIG, CHOLHDL, LDLDIRECT in the last 72 hours. Thyroid Function Tests: No results for input(s): TSH, T4TOTAL, FREET4, T3FREE, THYROIDAB in the last 72 hours. Anemia Panel: No results for input(s): VITAMINB12, FOLATE, FERRITIN, TIBC, IRON, RETICCTPCT in the last 72 hours. Sepsis Labs: No results for input(s): PROCALCITON, LATICACIDVEN in the last 168 hours.  No results found for this or any previous visit (from the past 240 hour(s)).   Radiology Studies: IR Angiogram Visceral Selective  Result Date: 01/28/2020 INDICATION: 59 year old female with end-stage renal disease on hemodialysis. She is anuric. 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 Unfortunately, she has now developed severe spontaneous hemorrhage from the left kidney which appears to be slowly expanding and requiring transfusion. Therefore, she presents for left  renal angiogram and planned embolization. EXAM: SELECTIVE VISCERAL ARTERIOGRAPHY; IR EMBO ART VEN HEMORR LYMPH EXTRAV INC GUIDE ROADMAPPING 1. Left renal angiogram 2. Selective catheterization and angiography of inferior division ule left renal artery 3. Coil embolization of left renal arteries 4. Aortic angiogram MEDICATIONS: None. ANESTHESIA/SEDATION: Moderate (conscious) sedation was employed during this procedure. A total of Versed 1 mg and Fentanyl 25 mcg was administered intravenously. Moderate Sedation Time: 40 minutes. The patient's level of consciousness and vital signs were monitored continuously by radiology nursing throughout the procedure under my direct supervision. CONTRAST:  45mL OMNIPAQUE IOHEXOL 300 MG/ML  SOLN FLUOROSCOPY TIME:  Fluoroscopy Time: 9 minutes 48 seconds (185.3 mGy). COMPLICATIONS: None immediate. PROCEDURE: Informed consent was obtained from the patient following explanation of the procedure, risks, benefits and alternatives. The patient understands, agrees and consents for the procedure. All questions were addressed. A time out was performed prior to the initiation of the procedure. Maximal barrier sterile technique utilized including caps, mask, sterile gowns, sterile gloves, large sterile drape, hand hygiene, and Betadine prep. The right common femoral artery was interrogated with ultrasound and found to be widely patent. An image was obtained and stored for the medical record. Local anesthesia was attained by infiltration with 1% lidocaine. A small dermatotomy was made. Under real-time sonographic guidance, the vessel was punctured with a 21 gauge micropuncture needle. Using standard technique, the initial micro needle was exchanged over a 0.018 micro wire for a transitional 4 [place] micro sheath. The micro sheath was then exchanged over a 0.035 wire for a 5 French vascular sheath. A C2 cobra catheter was advanced over a Bentson wire into the abdominal aorta and used to select  the left renal artery. A renal arteriogram was performed. The renal vessels are highly abnormal. The anterior and posterior divisions are stenotic and beaded. Interlobular arteries in the lower pole and interpolar region demonstrate small focal pseudoaneurysms with slow extravasation of contrast material consistent with the patient's clinical history of progressive left renal hemorrhage. A renegade ST microcatheter was next advanced over a Fathom 16 wire into the inferior division ule artery. Contrast injection was performed confirming the presence of a pseudoaneurysm and slow extravasation of contrast material. Coil embolization was then performed using 2 separate 2 x 4 mm penumbra  LP coils. The coil pack successfully filled the targeted branch and we were also able to prolapse of few of the coils into the superior division ule branch as well. The distal main renal artery was then further coil embolized using fibered detachable interlock coils. A total of 4 coils were placed. Follow-up angiography demonstrates successful occlusion of the left renal artery and its distal branches. No further opacification of the pseudo aneurysms and no evidence of continued bleeding. To ensure that there are no accessory renal arteries that also need to be treated, the C2 cobra catheter was then exchanged over a Bentson wire and an Omni flush catheter was advanced into the abdominal aorta. Abdominal aortography was next performed. No evidence of accessory left renal artery or additional source of hemorrhage. Small caliber and likely significantly stenosed right renal artery. No evidence of abdominal aortic aneurysm. The bilateral visualized iliac arteries are widely patent. IMPRESSION: 1. Highly abnormal divisional and interlobular left renal arteries with multiple areas of focal pseudoaneurysm formation and slow extravasation contrast material. 2. Successful coil embolization of the superior and inferior left divisional renal  arteries and coil embolization of the distal aspect of the left main renal artery. Signed, Connie Peaches, MD, [name] Radiology Electronically Signed   By: Jacqulynn Cadet M.D.   On: 01/28/2020 10:06   IR EMBO ART  VEN HEMORR LYMPH EXTRAV  INC GUIDE ROADMAPPING  Result Date: 01/28/2020 INDICATION: 59 year old female with end-stage renal disease on hemodialysis. She is anuric. 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 Unfortunately, she has now developed severe spontaneous hemorrhage from the left kidney which appears to be slowly expanding and requiring transfusion. Therefore, she presents for left renal angiogram and planned embolization. EXAM: SELECTIVE VISCERAL ARTERIOGRAPHY; IR EMBO ART VEN HEMORR LYMPH EXTRAV INC GUIDE ROADMAPPING 1. Left renal angiogram 2. Selective catheterization and angiography of inferior division ule left renal artery 3. Coil embolization of left renal arteries 4. Aortic angiogram MEDICATIONS: None. ANESTHESIA/SEDATION: Moderate (conscious) sedation was employed during this procedure. A total of Versed 1 mg and Fentanyl 25 mcg was administered intravenously. Moderate Sedation Time: 40 minutes. The patient's level of consciousness and vital signs were monitored continuously by radiology nursing throughout the procedure under my direct supervision. CONTRAST:  15mL OMNIPAQUE IOHEXOL 300 MG/ML  SOLN FLUOROSCOPY TIME:  Fluoroscopy Time: 9 minutes 48 seconds (185.3 mGy). COMPLICATIONS: None immediate. PROCEDURE: Informed consent was obtained from the patient following explanation of the procedure, risks, benefits and alternatives. The patient understands, agrees and consents for the procedure. All questions were addressed. A time out was performed prior to the initiation of the procedure. Maximal barrier sterile technique utilized  including caps, mask, sterile gowns, sterile gloves, large sterile drape, hand hygiene, and Betadine prep. The right common femoral artery was interrogated with ultrasound and found to be widely patent. An image was obtained and stored for the medical record. Local anesthesia was attained by infiltration with 1% lidocaine. A small dermatotomy was made. Under real-time sonographic guidance, the vessel was punctured with a 21 gauge micropuncture needle. Using standard technique, the initial micro needle was exchanged over a 0.018 micro wire for a transitional 4 [place] micro sheath. The micro sheath was then exchanged over a 0.035 wire for a 5 French vascular sheath. A C2 cobra catheter was advanced over a Bentson wire into the abdominal aorta and used to select the left renal artery. A  renal arteriogram was performed. The renal vessels are highly abnormal. The anterior and posterior divisions are stenotic and beaded. Interlobular arteries in the lower pole and interpolar region demonstrate small focal pseudoaneurysms with slow extravasation of contrast material consistent with the patient's clinical history of progressive left renal hemorrhage. A renegade ST microcatheter was next advanced over a Fathom 16 wire into the inferior division ule artery. Contrast injection was performed confirming the presence of a pseudoaneurysm and slow extravasation of contrast material. Coil embolization was then performed using 2 separate 2 x 4 mm penumbra LP coils. The coil pack successfully filled the targeted branch and we were also able to prolapse of few of the coils into the superior division ule branch as well. The distal main renal artery was then further coil embolized using fibered detachable interlock coils. A total of 4 coils were placed. Follow-up angiography demonstrates successful occlusion of the left renal artery and its distal branches. No further opacification of the pseudo aneurysms and no evidence of continued  bleeding. To ensure that there are no accessory renal arteries that also need to be treated, the C2 cobra catheter was then exchanged over a Bentson wire and an Omni flush catheter was advanced into the abdominal aorta. Abdominal aortography was next performed. No evidence of accessory left renal artery or additional source of hemorrhage. Small caliber and likely significantly stenosed right renal artery. No evidence of abdominal aortic aneurysm. The bilateral visualized iliac arteries are widely patent. IMPRESSION: 1. Highly abnormal divisional and interlobular left renal arteries with multiple areas of focal pseudoaneurysm formation and slow extravasation contrast material. 2. Successful coil embolization of the superior and inferior left divisional renal arteries and coil embolization of the distal aspect of the left main renal artery. Signed, Connie Peaches, MD, [place] [facility] Radiology Electronically Signed   By: Jacqulynn Cadet M.D.   On: 01/28/2020 10:06    Scheduled Meds:  sodium chloride   Intravenous Once   sodium chloride   Intravenous Once   calcitRIOL  2 mcg Oral Q M,W,F   cinacalcet  180 mg Oral Q M,W,F-HD   docusate sodium  100 mg Oral Daily   ferric citrate  420 mg Oral TID WC   metoprolol tartrate  25 mg Oral BID   multivitamin  1 tablet Oral QHS   pantoprazole  40 mg Oral QAC breakfast   polyethylene glycol  17 g Oral BID   senna  1 tablet Oral Daily   simethicone  80 mg Oral TID   Warfarin - Pharmacist Dosing Inpatient   Does not apply q1600   Continuous Infusions:  sodium chloride     heparin 950 Units/hr (01/28/20 0545)     LOS: 22 days    Time spent: 35 mins.    Shawna Clamp, MD [facility]   If 7PM-7AM, please contact night-coverage

## 2020-01-28 NOTE — Care Management Important Message (Signed)
Important Message  Patient Details  Name: [name] MRN: [id number] Date of Birth: [birth year]   Medicare Important Message Given:  Yes     Shelda Altes 01/28/2020, 10:20 AM

## 2020-01-28 NOTE — Significant Event (Addendum)
HOSPITAL MEDICINE OVERNIGHT EVENT NOTE    Notified by nursing that Hemoglobin is 6.8 this morning, a decrease from 7.5 yesterday despite receiving 2 units with dialysis yesterday.  Hemodynamically stable.  Patient denies chest pain, light headedness, shortness of breath.  No clinical evidence of bleeding.  Considering presence of mechanical aortic/mitral heart valve will keep heparin going for now and continue to monitor clinically.    Ordering 1 unit PRBC over 4 hrs now.  Ordering repeat H/H at 12PM.  Day provider to follow up.     Connie Emerald  MD [facility]

## 2020-01-28 NOTE — Progress Notes (Signed)
CRITICAL VALUE ALERT  Critical Value:  Hgb 6.8  Date & Time Notied:  0437 9/921  Provider Notified: Dr Marlyce Huge  Orders Received/Actions taken: Awaiting new orders.

## 2020-01-28 NOTE — TOC Initial Note (Signed)
Transition of Care [facility]) - Initial/Assessment Note    Patient Details  Name: [name] MRN: [id number] Date of Birth: Jul 24, 1960  Transition of Care [facility] CM/SW Contact:    Zenon Mayo, RN Phone Number: 01/28/2020, 4:25 PM  Clinical Narrative:                 Patient lives with daughter, she has walker , bsc and home oxygen.  NCM offered choice for HHPT, she states she does not want HHPT.  She will have transportation at discharge and she has no issues with getting her medications.   Expected Discharge Plan: [place] Barriers to Discharge: Continued Medical Work up   Patient Goals and CMS Choice Patient states their goals for this hospitalization and ongoing recovery are:: get better CMS Medicare.gov Compare Post Acute Care list provided to:: Patient Choice offered to / list presented to : Patient  Expected Discharge Plan and Services Expected Discharge Plan: [place] In-house Referral: NA Discharge Planning Services: CM Consult Post Acute Care Choice: NA Living arrangements for the past 2 months: Single Family Home                           HH Arranged: NA          Prior Living Arrangements/Services Living arrangements for the past 2 months: Single Family Home Lives with:: Adult Children Patient language and need for interpreter reviewed:: Yes Do you feel safe going back to the place where you live?: Yes      Need for Family Participation in Patient Care: Yes (Comment) Care giver support system in place?: Yes (comment) Current home services: DME (walker, bsc,oxygen) Criminal Activity/Legal Involvement Pertinent to Current Situation/Hospitalization: No - Comment as needed  Activities of Daily Living Home Assistive Devices/Equipment: Oxygen ADL Screening (condition at time of admission) Patient's cognitive ability adequate to safely complete daily activities?: Yes Is the patient deaf or have difficulty  hearing?: No Does the patient have difficulty seeing, even when wearing glasses/contacts?: No Does the patient have difficulty concentrating, remembering, or making decisions?: No Patient able to express need for assistance with ADLs?: Yes Does the patient have difficulty dressing or bathing?: No Independently performs ADLs?: Yes (appropriate for developmental age) Does the patient have difficulty walking or climbing stairs?: No Weakness of Legs: None Weakness of Arms/Hands: None  Permission Sought/Granted                  Emotional Assessment Appearance:: Appears stated age Attitude/Demeanor/Rapport: Engaged Affect (typically observed): Appropriate Orientation: : Oriented to Self, Oriented to Place, Oriented to  Time, Oriented to Situation   Psych Involvement: No (comment)  Admission diagnosis:  Retroperitoneal hemorrhage [R58] Flank pain [R10.9] Nontraumatic retroperitoneal hematoma [K66.1] Patient Active Problem List   Diagnosis Date Noted  . Status post mechanical aortic valve replacement 01/20/2020  . Retroperitoneal hemorrhage 01/06/2020  . Flank pain   . Nontraumatic retroperitoneal hematoma   . Anticoagulated on warfarin   . Hyponatremia 01/07/2019  . Pleural effusion 01/06/2019  . Encounter for therapeutic drug monitoring 12/16/2018  . H/O heart valve replacement with mechanical valve 12/02/2018  . S/P mitral valve replacement with metallic valve 19/50/9326  . Neck pain 10/27/2018  . Chronic diastolic (congestive) heart failure ([place])   . Symptomatic anemia 03/14/2018  . Hypokalemia 03/14/2018  . Chronic pain 03/14/2018  . GERD (gastroesophageal reflux disease) 03/14/2018  . ESRD needing dialysis ([place])   .  Volume overload 10/21/2017  . HCAP (healthcare-associated pneumonia) 10/21/2017  . Acute respiratory failure with hypoxia ([place]) 10/21/2017  . Fluid overload 01/26/2017  . HTN (hypertension) 01/26/2017  . Rotator cuff tear arthropathy, right 10/09/2016   . Aortic stenosis 09/25/2016  . Left hand pain 07/28/2015  . Post-traumatic osteoarthritis of right hand 07/28/2015  . Trigger finger of left thumb 07/28/2015  . History of sepsis 05/01/2015  . Hypoxia 05/01/2015  . Mitral stenosis 08/30/2014  . End-stage renal disease on hemodialysis ([place]) 07/25/2014  . History of pulmonary edema 07/25/2014  . Lupus (systemic lupus erythematosus) ([place]) 07/25/2014  . DUB (dysfunctional uterine bleeding) 07/25/2014  . Anemia 07/25/2014  . Peptic ulcer disease 07/25/2014  . History of stroke 07/25/2014  . Hypertensive heart disease with CHF (congestive heart failure) ([place]) 12/19/2010  . Dialysis care 12/19/2010   PCP:  Benito Mccreedy, MD Pharmacy:   [facility] [place], [place] - [address] [place] [address] Phone: [phone] Fax: [phone]     Social Determinants of Health (SDOH) Interventions    Readmission Risk Interventions Readmission Risk Prevention Plan 01/28/2020 12/11/2018  Transportation Screening Complete Complete  PCP or Specialist Appt within 3-5 Days Complete Complete  HRI or [place] Complete Complete  Social Work Consult for [place] Planning/Counseling Complete Complete  Palliative Care Screening Not Applicable Not Applicable  Medication Review Press photographer) Complete Complete  Some recent data might be hidden

## 2020-01-28 NOTE — Progress Notes (Signed)
Subjective: abd pain sig better, embolized L kidney per IR yest.  Hb 6.8 > 2u and repeat is 8.4. no SOB  Objective Vital signs in last 24 hours: Vitals:   01/28/20 0606 01/28/20 0759 01/28/20 0815 01/28/20 1158  BP: 116/65 118/73 115/71 113/73  Pulse: 89 90 92 83  Resp: 16 14  16   Temp: 98.2 F (36.8 C) 99 F (37.2 C) 98.2 F (36.8 C) 98.8 F (37.1 C)  TempSrc: Oral Oral Oral Oral  SpO2: 100% 100% 100% 100%  Weight:      Height:       Weight change: 0.3 kg  Physical Exam: General: alert, not in distress Heart: RRR, 2/6 SEM, mechanical heart sound Lungs: CTA bilaterally nonlabored breathing Abdomen: Bowel sounds positive, nondistended, tender left flank Extremities: No lower extremity edema Dialysis Access: RUA aVF+ bruit   OP HD: MWF    4h  61kg  450/800   2/2 bath  P2  Hep 1800  -sensipar 180mg   -calcitriol 58mcg  -no esa, no iron   Problem/Plan: 1.L retroperitoneal perirenal bleed / hematoma -left flank pain this a.m. room RN calling admit team, Bld seen on CT 8/18. S/p 2 units FFP, 4 units pRBC total and Vit K. IV heparin started 8/21 and stopped 8/22, given more pRBC. IV hep and coumadin restarted 8/23.Seen by Urology, if rebleeds plan is to embolize L kidney. Rpt CT Abd 9/02 w/ IV contrast identified an organizing hematoma, no renal mass could be seen - pt's L flank pain has recurred x last 2-3 days here, Hb dropped to 8's and today into the 6's, IV hep stopped yest  - INR reversed w/ IV vit K 5mg   - IR embolized the entire L kidney on 9/8  - on IV hep now, getting 1-2 u prbc this am for Hb 6.8  - abd pain and nausea improved since embolization  2. 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 ESRD- on HD MWF. HD tomorrow. No Heparin.  5. Anemiaof CKD- As above S/p 6 units pRBC. TSAT 84% 8/30.  6. Secondary hyperparathyroidism- Phos at goal. 3.4 cont low Ca bath. . Continue  VDRA and sensipar. Noted on Auryxia 840 mg with phosphorus controlled in hospital and with high TSAT level but transfused will decrease to 420 G follow up trend no changes for now 7.HTN/volume- BP stable, UF to 61kg tomorrow  8. Nutrition- Renal diet w/fluid restrictions. Alb 2.8. Protein supplements.    [name], MD 01/28/2020, 2:45 PM      Labs: Basic Metabolic Panel: Recent Labs  Lab 01/26/20 0511 01/27/20 0337 01/28/20 0407  NA 135 129* 133*  K 3.7 4.2 3.6  CL 95* 90* 93*  CO2 28 27 28   GLUCOSE 99 108* 119*  BUN 16 28* 16  CREATININE 4.92* 7.04* 4.83*  CALCIUM 9.2 9.5 8.2*  PHOS 3.8 5.7* 4.4   Liver Function Tests: Recent Labs  Lab 01/26/20 0511 01/27/20 0337 01/28/20 0407  ALBUMIN 2.7* 2.5* 2.4*   No results for input(s): LIPASE, AMYLASE in the last 168 hours. No results for input(s): AMMONIA in the last 168 hours. CBC: Recent Labs  Lab 01/24/20 0614 01/24/20 1696 01/25/20 0430 01/25/20 1822 01/26/20 0747 01/26/20 0747 01/27/20 0455 01/27/20 0455 01/27/20 1853 01/28/20 0407 01/28/20 1312  WBC 6.6   < > 9.1  --  10.5  --  17.5*  --   --  12.6*  --  HGB 9.5*   < > 9.7*   < > 8.6*   < > 6.6*   < > 7.5* 6.8* 8.2*  HCT 30.0*   < > 30.6*   < > 27.0*   < > 20.6*   < > 22.9* 21.1* 25.1*  MCV 98.7  --  98.1  --  100.7*  --  101.5*  --   --  95.9  --   PLT 236   < > 236  --  218  --  228  --   --  214  --    < > = values in this interval not displayed.   Cardiac Enzymes: No results for input(s): CKTOTAL, CKMB, CKMBINDEX, TROPONINI in the last 168 hours. CBG: No results for input(s): GLUCAP in the last 168 hours.  Studies/Results: IR Angiogram Visceral Selective  Result Date: 01/28/2020 INDICATION: 59 year old female with end-stage renal disease on hemodialysis. She is anuric. 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 Unfortunately, she has now developed  severe spontaneous hemorrhage from the left kidney which appears to be slowly expanding and requiring transfusion. Therefore, she presents for left renal angiogram and planned embolization. EXAM: SELECTIVE VISCERAL ARTERIOGRAPHY; IR EMBO ART VEN HEMORR LYMPH EXTRAV INC GUIDE ROADMAPPING 1. Left renal angiogram 2. Selective catheterization and angiography of inferior division ule left renal artery 3. Coil embolization of left renal arteries 4. Aortic angiogram MEDICATIONS: None. ANESTHESIA/SEDATION: Moderate (conscious) sedation was employed during this procedure. A total of Versed 1 mg and Fentanyl 25 mcg was administered intravenously. Moderate Sedation Time: 40 minutes. The patient's level of consciousness and vital signs were monitored continuously by radiology nursing throughout the procedure under my direct supervision. CONTRAST:  58mL OMNIPAQUE IOHEXOL 300 MG/ML  SOLN FLUOROSCOPY TIME:  Fluoroscopy Time: 9 minutes 48 seconds (185.3 mGy). COMPLICATIONS: None immediate. PROCEDURE: Informed consent was obtained from the patient following explanation of the procedure, risks, benefits and alternatives. The patient understands, agrees and consents for the procedure. All questions were addressed. A time out was performed prior to the initiation of the procedure. Maximal barrier sterile technique utilized including caps, mask, sterile gowns, sterile gloves, large sterile drape, hand hygiene, and Betadine prep. The right common femoral artery was interrogated with ultrasound and found to be widely patent. An image was obtained and stored for the medical record. Local anesthesia was attained by infiltration with 1% lidocaine. A small dermatotomy was made. Under real-time sonographic guidance, the vessel was punctured with a 21 gauge micropuncture needle. Using standard technique, the initial micro needle was exchanged over a 0.018 micro wire for a transitional 4 [place] micro sheath. The micro sheath was then exchanged  over a 0.035 wire for a 5 French vascular sheath. A C2 cobra catheter was advanced over a Bentson wire into the abdominal aorta and used to select the left renal artery. A renal arteriogram was performed. The renal vessels are highly abnormal. The anterior and posterior divisions are stenotic and beaded. Interlobular arteries in the lower pole and interpolar region demonstrate small focal pseudoaneurysms with slow extravasation of contrast material consistent with the patient's clinical history of progressive left renal hemorrhage. A renegade ST microcatheter was next advanced over a Fathom 16 wire into the inferior division ule artery. Contrast injection was performed confirming the presence of a pseudoaneurysm and slow extravasation of contrast material. Coil embolization was then performed using 2 separate 2 x 4 mm penumbra LP coils. The coil  pack successfully filled the targeted branch and we were also able to prolapse of few of the coils into the superior division ule branch as well. The distal main renal artery was then further coil embolized using fibered detachable interlock coils. A total of 4 coils were placed. Follow-up angiography demonstrates successful occlusion of the left renal artery and its distal branches. No further opacification of the pseudo aneurysms and no evidence of continued bleeding. To ensure that there are no accessory renal arteries that also need to be treated, the C2 cobra catheter was then exchanged over a Bentson wire and an Omni flush catheter was advanced into the abdominal aorta. Abdominal aortography was next performed. No evidence of accessory left renal artery or additional source of hemorrhage. Small caliber and likely significantly stenosed right renal artery. No evidence of abdominal aortic aneurysm. The bilateral visualized iliac arteries are widely patent. IMPRESSION: 1. Highly abnormal divisional and interlobular left renal arteries with multiple areas of focal  pseudoaneurysm formation and slow extravasation contrast material. 2. Successful coil embolization of the superior and inferior left divisional renal arteries and coil embolization of the distal aspect of the left main renal artery. Signed, Criselda Peaches, MD, [facility] Radiology Electronically Signed   By: Jacqulynn Cadet M.D.   On: 01/28/2020 10:06   IR EMBO ART  VEN HEMORR LYMPH EXTRAV  INC GUIDE ROADMAPPING  Result Date: 01/28/2020 INDICATION: 59 year old female with end-stage renal disease on hemodialysis. She is anuric. 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 Unfortunately, she has now developed severe spontaneous hemorrhage from the left kidney which appears to be slowly expanding and requiring transfusion. Therefore, she presents for left renal angiogram and planned embolization. EXAM: SELECTIVE VISCERAL ARTERIOGRAPHY; IR EMBO ART VEN HEMORR LYMPH EXTRAV INC GUIDE ROADMAPPING 1. Left renal angiogram 2. Selective catheterization and angiography of inferior division ule left renal artery 3. Coil embolization of left renal arteries 4. Aortic angiogram MEDICATIONS: None. ANESTHESIA/SEDATION: Moderate (conscious) sedation was employed during this procedure. A total of Versed 1 mg and Fentanyl 25 mcg was administered intravenously. Moderate Sedation Time: 40 minutes. The patient's level of consciousness and vital signs were monitored continuously by radiology nursing throughout the procedure under my direct supervision. CONTRAST:  54mL OMNIPAQUE IOHEXOL 300 MG/ML  SOLN FLUOROSCOPY TIME:  Fluoroscopy Time: 9 minutes 48 seconds (185.3 mGy). COMPLICATIONS: None immediate. PROCEDURE: Informed consent was obtained from the patient following explanation of the procedure, risks, benefits and alternatives. The patient understands, agrees and consents for the  procedure. All questions were addressed. A time out was performed prior to the initiation of the procedure. Maximal barrier sterile technique utilized including caps, mask, sterile gowns, sterile gloves, large sterile drape, hand hygiene, and Betadine prep. The right common femoral artery was interrogated with ultrasound and found to be widely patent. An image was obtained and stored for the medical record. Local anesthesia was attained by infiltration with 1% lidocaine. A small dermatotomy was made. Under real-time sonographic guidance, the vessel was punctured with a 21 gauge micropuncture needle. Using standard technique, the initial micro needle was exchanged over a 0.018 micro wire for a transitional 4 [place] micro sheath. The micro sheath was then exchanged over a 0.035 wire for a 5 French vascular sheath. A C2 cobra catheter was advanced over a Bentson wire into the abdominal aorta and used to select the left renal artery. A renal arteriogram was performed.  The renal vessels are highly abnormal. The anterior and posterior divisions are stenotic and beaded. Interlobular arteries in the lower pole and interpolar region demonstrate small focal pseudoaneurysms with slow extravasation of contrast material consistent with the patient's clinical history of progressive left renal hemorrhage. A renegade ST microcatheter was next advanced over a Fathom 16 wire into the inferior division ule artery. Contrast injection was performed confirming the presence of a pseudoaneurysm and slow extravasation of contrast material. Coil embolization was then performed using 2 separate 2 x 4 mm penumbra LP coils. The coil pack successfully filled the targeted branch and we were also able to prolapse of few of the coils into the superior division ule branch as well. The distal main renal artery was then further coil embolized using fibered detachable interlock coils. A total of 4 coils were placed. Follow-up angiography demonstrates  successful occlusion of the left renal artery and its distal branches. No further opacification of the pseudo aneurysms and no evidence of continued bleeding. To ensure that there are no accessory renal arteries that also need to be treated, the C2 cobra catheter was then exchanged over a Bentson wire and an Omni flush catheter was advanced into the abdominal aorta. Abdominal aortography was next performed. No evidence of accessory left renal artery or additional source of hemorrhage. Small caliber and likely significantly stenosed right renal artery. No evidence of abdominal aortic aneurysm. The bilateral visualized iliac arteries are widely patent. IMPRESSION: 1. Highly abnormal divisional and interlobular left renal arteries with multiple areas of focal pseudoaneurysm formation and slow extravasation contrast material. 2. Successful coil embolization of the superior and inferior left divisional renal arteries and coil embolization of the distal aspect of the left main renal artery. Signed, Criselda Peaches, MD, [facility] Radiology Electronically Signed   By: Jacqulynn Cadet M.D.   On: 01/28/2020 10:06   Medications: . sodium chloride    . heparin 950 Units/hr (01/28/20 0545)   . sodium chloride   Intravenous Once  . sodium chloride   Intravenous Once  . calcitRIOL  2 mcg Oral Q M,W,F  . cinacalcet  180 mg Oral Q M,W,F-HD  . docusate sodium  100 mg Oral Daily  . ferric citrate  420 mg Oral TID WC  . metoprolol tartrate  25 mg Oral BID  . multivitamin  1 tablet Oral QHS  . pantoprazole  40 mg Oral QAC breakfast  . polyethylene glycol  17 g Oral BID  . senna  1 tablet Oral Daily  . simethicone  80 mg Oral TID  . Warfarin - Pharmacist Dosing Inpatient   Does not apply [id number]

## 2020-01-28 NOTE — Progress Notes (Addendum)
Physical Therapy Treatment Patient Details Name: [name] MRN: [id number] DOB: 30-Oct-1960 Today's Date: 01/28/2020    History of Present Illness Patient is a 59 year old female with history of ESRD on HD MWF, s/p aortic and mitral valve replacement on warfarin therapy who presented with nausea, nonbloody emesis and abdominal pain; found to have spontaneous left retroperitoneal bleed.  On 9/8 underwent Left renal angiogram and coil embolization.     PT Comments    Pt admitted with above diagnosis. Low tolerance for activity today as Hgb low due to procedure yesterday and pt had received one transfusion earlier today.  Pt agreed to get OOB and do a little exercise.  Pt has not met goals due to multiple setbacks medically. Revised goals.  Updated frequency and d/c plan as well as pt will need therapy once home as well a equipment and needs to be seen 3x week for progress.  Pt currently with functional limitations due to balance and endurance deficits. Pt will benefit from skilled PT to increase their independence and safety with mobility to allow discharge to the venue listed below.     Follow Up Recommendations  Supervision - Intermittent;Home health PT     Equipment Recommendations  Rolling walker with 5" wheels    Recommendations for Other Services       Precautions / Restrictions Precautions Precautions: Fall Restrictions Weight Bearing Restrictions: No    Mobility  Bed Mobility Overal bed mobility: Modified Independent                Transfers Overall transfer level: Needs assistance Equipment used: None Transfers: Sit to/from Stand;Stand Pivot Transfers Sit to Stand: Supervision Stand pivot transfers: Supervision       General transfer comment: supervision for lines  Ambulation/Gait                 Stairs             Wheelchair Mobility    Modified Rankin (Stroke Patients Only)       Balance Overall balance assessment: No  apparent balance deficits (not formally assessed)                                          Cognition Arousal/Alertness: Awake/alert Behavior During Therapy: WFL for tasks assessed/performed Overall Cognitive Status: Within Functional Limits for tasks assessed                                        Exercises General Exercises - Lower Extremity Long Arc Quad: Strengthening;10 reps;AROM;Seated Hip Flexion/Marching: AROM;Both;10 reps;Seated    General Comments        Pertinent Vitals/Pain Pain Assessment: No/denies pain    Home Living                      Prior Function            PT Goals (current goals can now be found in the care plan section) Acute Rehab PT Goals Patient Stated Goal: manage her symptoms PT Goal Formulation: With patient Time For Goal Achievement: 02/11/20 Potential to Achieve Goals: Good Progress towards PT goals: Progressing toward goals    Frequency    Min 3X/week      PT Plan Discharge plan needs to be updated;Frequency  needs to be updated    Co-evaluation              AM-PAC PT "6 Clicks" Mobility   Outcome Measure  Help needed turning from your back to your side while in a flat bed without using bedrails?: None Help needed moving from lying on your back to sitting on the side of a flat bed without using bedrails?: None Help needed moving to and from a bed to a chair (including a wheelchair)?: None Help needed standing up from a chair using your arms (e.g., wheelchair or bedside chair)?: None Help needed to walk in hospital room?: A Little Help needed climbing 3-5 steps with a railing? : A Little 6 Click Score: 22    End of Session Equipment Utilized During Treatment: Gait belt;Oxygen Activity Tolerance: Patient tolerated treatment well Patient left: with call bell/phone within reach;in chair Nurse Communication: Mobility status PT Visit Diagnosis: Muscle weakness (generalized)  (M62.81)     Time: 5056-9794 PT Time Calculation (min) (ACUTE ONLY): 13 min  Charges:  $Therapeutic Activity: 8-22 mins                     [name] W,PT Acute Rehabilitation Services Pager:  [phone]  Office:  [place] 01/28/2020, 1:40 PM

## 2020-01-28 NOTE — Progress Notes (Signed)
ANTICOAGULATION CONSULT NOTE - Initial Consult  Pharmacy Consult for heparin, warfarin Indication: mechanical AVR/MVR  No Known Allergies  Patient Measurements: Height: 5\' 6"  (167.6 cm) Weight: 62.5 kg (137 lb 12.6 oz) IBW/kg (Calculated) : 59.3 Heparin Dosing Weight: 62kg  Vital Signs: Temp: 99 F (37.2 C) (09/09 0759) Temp Source: Oral (09/09 0759) BP: 118/73 (09/09 0759) Pulse Rate: 90 (09/09 0759)  Labs: Recent Labs    01/25/20 1617 01/25/20 1822 01/26/20 0511 01/26/20 0511 01/26/20 0747 01/26/20 0747 01/27/20 0337 01/27/20 0455 01/27/20 0455 01/27/20 1853 01/28/20 0407  HGB  --    < > 8.5*   < > 8.6*   < >  --  6.6*   < > 7.5* 6.8*  HCT  --    < > 27.3*   < > 27.0*   < >  --  20.6*  --  22.9* 21.1*  PLT  --   --   --   --  218  --   --  228  --   --  214  LABPROT  --   --  29.4*  --   --   --  16.3*  --   --   --  14.4  INR  --   --  2.9*  --   --   --  1.4*  --   --   --  1.2  HEPARINUNFRC 0.25*  --  0.28*  --   --   --   --   --   --   --  0.23*  CREATININE  --   --  4.92*  --   --   --  7.04*  --   --   --  4.83*   < > = values in this interval not displayed.    Estimated Creatinine Clearance: 12 mL/min (A) (by C-G formula based on SCr of 4.83 mg/dL (H)).   Assessment: 59 yo W on warfarin PTA for hx mechanical AVR/MVR (goal 2.5-3.5), reversed with vit K 10 mg IV + 2 units FFP  + 4 units PRBcs on 8/18 and another vit K po 5mg  on 9/7 with 2 unit RBCs 9/8-9/9 due to continued retroperitoneal bleed.   9/7-9/8 Heparin held due to continued RP bleed 9/8 s/p L renal angiogram and coil embolization and heparin was restarted in the evening 9/9 Hgb continued to drop. INR 1.2. Per discussion with MD, will not increase heparin despite subtherapeutic level of 0.23 and will hold warfarin today   PTA Warfarin regimen: 9 mg daily except 6 mg on Mondays - last 8/18 (INR was 3.6 on admit 8/18)  Goal of Therapy:  Heparin level 0.3 -0.5 units/ml  INR 2.5- 3.5  Monitor  platelets by anticoagulation protocol: Yes   Plan:  Continue heparin at 950 units/ hr despite subtherapeutic level given continued RP bleed Hold warfarin today Monitor daily INR, HL, CBC/plt Monitor for signs/symptoms of bleeding and f/u anticoagulation plan with MD    Benetta Spar, PharmD, BCPS, BCCP Clinical Pharmacist  Please check AMION for all [place] phone numbers After 10:00 PM, call [place]

## 2020-01-29 ENCOUNTER — Encounter (HOSPITAL_COMMUNITY): Payer: Self-pay

## 2020-01-29 LAB — RENAL FUNCTION PANEL
Albumin: 2.5 g/dL — ABNORMAL LOW (ref 3.5–5.0)
Anion gap: 13 (ref 5–15)
BUN: 33 mg/dL — ABNORMAL HIGH (ref 6–20)
CO2: 25 mmol/L (ref 22–32)
Calcium: 8.4 mg/dL — ABNORMAL LOW (ref 8.9–10.3)
Chloride: 91 mmol/L — ABNORMAL LOW (ref 98–111)
Creatinine, Ser: 7.42 mg/dL — ABNORMAL HIGH (ref 0.44–1.00)
GFR calc Af Amer: 6 mL/min — ABNORMAL LOW (ref >60)
GFR calc non Af Amer: 6 mL/min — ABNORMAL LOW (ref >60)
Glucose, Bld: 117 mg/dL — ABNORMAL HIGH (ref 70–99)
Phosphorus: 4.1 mg/dL (ref 2.5–4.6)
Potassium: 3.8 mmol/L (ref 3.5–5.1)
Sodium: 129 mmol/L — ABNORMAL LOW (ref 135–145)

## 2020-01-29 LAB — CBC
HCT: 24.4 % — ABNORMAL LOW (ref 36.0–46.0)
HCT: 24 % — ABNORMAL LOW (ref 36.0–46.0)
Hemoglobin: 7.9 g/dL — ABNORMAL LOW (ref 12.0–15.0)
Hemoglobin: 7.7 g/dL — ABNORMAL LOW (ref 12.0–15.0)
MCH: 31.1 pg (ref 26.0–34.0)
MCH: 31 pg (ref 26.0–34.0)
MCHC: 32.4 g/dL (ref 30.0–36.0)
MCHC: 32.1 g/dL (ref 30.0–36.0)
MCV: 96.1 fL (ref 80.0–100.0)
MCV: 96.8 fL (ref 80.0–100.0)
Platelets: 242 10*3/uL (ref 150–400)
Platelets: 234 10*3/uL (ref 150–400)
RBC: 2.54 MIL/uL — ABNORMAL LOW (ref 3.87–5.11)
RBC: 2.48 MIL/uL — ABNORMAL LOW (ref 3.87–5.11)
RDW: 17.8 % — ABNORMAL HIGH (ref 11.5–15.5)
RDW: 17.4 % — ABNORMAL HIGH (ref 11.5–15.5)
WBC: 9.3 10*3/uL (ref 4.0–10.5)
WBC: 9.4 10*3/uL (ref 4.0–10.5)
nRBC: 0 % (ref 0.0–0.2)
nRBC: 0.2 % (ref 0.0–0.2)

## 2020-01-29 LAB — BPAM RBC
Blood Product Expiration Date: 202109132359
Blood Product Expiration Date: 202109292359
ISSUE DATE / TIME: 202109080924
ISSUE DATE / TIME: 202109090538
Unit Type and Rh: 600
Unit Type and Rh: 600

## 2020-01-29 LAB — COMPREHENSIVE METABOLIC PANEL
ALT: 16 U/L (ref 0–44)
AST: 22 U/L (ref 15–41)
Albumin: 2.5 g/dL — ABNORMAL LOW (ref 3.5–5.0)
Alkaline Phosphatase: 97 U/L (ref 38–126)
Anion gap: 13 (ref 5–15)
BUN: 33 mg/dL — ABNORMAL HIGH (ref 6–20)
CO2: 25 mmol/L (ref 22–32)
Calcium: 8.4 mg/dL — ABNORMAL LOW (ref 8.9–10.3)
Chloride: 91 mmol/L — ABNORMAL LOW (ref 98–111)
Creatinine, Ser: 7.5 mg/dL — ABNORMAL HIGH (ref 0.44–1.00)
GFR calc Af Amer: 6 mL/min — ABNORMAL LOW (ref >60)
GFR calc non Af Amer: 5 mL/min — ABNORMAL LOW (ref >60)
Glucose, Bld: 116 mg/dL — ABNORMAL HIGH (ref 70–99)
Potassium: 3.7 mmol/L (ref 3.5–5.1)
Sodium: 129 mmol/L — ABNORMAL LOW (ref 135–145)
Total Bilirubin: 0.8 mg/dL (ref 0.3–1.2)
Total Protein: 6.9 g/dL (ref 6.5–8.1)

## 2020-01-29 LAB — TYPE AND SCREEN
ABO/RH(D): A NEG
Antibody Screen: NEGATIVE
Unit division: 0
Unit division: 0

## 2020-01-29 LAB — PROTIME-INR
INR: 1.1 (ref 0.8–1.2)
Prothrombin Time: 13.4 seconds (ref 11.4–15.2)

## 2020-01-29 LAB — HEPARIN LEVEL (UNFRACTIONATED)
Heparin Unfractionated: 0.27 IU/mL — ABNORMAL LOW (ref 0.30–0.70)
Heparin Unfractionated: 0.24 IU/mL — ABNORMAL LOW (ref 0.30–0.70)

## 2020-01-29 LAB — PHOSPHORUS: Phosphorus: 4.1 mg/dL (ref 2.5–4.6)

## 2020-01-29 LAB — MAGNESIUM: Magnesium: 1.8 mg/dL (ref 1.7–2.4)

## 2020-01-29 MED ORDER — WARFARIN SODIUM 3 MG PO TABS
6.0000 mg | ORAL_TABLET | Freq: Once | ORAL | Status: AC
  Administered 2020-01-29: 6 mg via ORAL
  Filled 2020-01-29: qty 2

## 2020-01-29 NOTE — Progress Notes (Addendum)
ANTICOAGULATION CONSULT NOTE - Initial Consult  Pharmacy Consult for heparin, warfarin Indication: mechanical AVR/MVR  No Known Allergies  Patient Measurements: Height: 5\' 6"  (167.6 cm) Weight: 63.7 kg (140 lb 6.4 oz) (scale a) IBW/kg (Calculated) : 59.3 Heparin Dosing Weight: 62kg  Vital Signs: Temp: 98.1 F (36.7 C) (09/10 0510) Temp Source: Oral (09/10 0510) BP: 116/73 (09/10 0510) Pulse Rate: 92 (09/10 0510)  Labs: Recent Labs    01/27/20 0337 01/27/20 0455 01/27/20 1853 01/28/20 0407 01/28/20 0407 01/28/20 1312 01/28/20 1908  HGB  --  6.6*   < > 6.8*   < > 8.2* 7.9*  HCT  --  20.6*   < > 21.1*  --  25.1* 24.5*  PLT  --  228  --  214  --   --   --   LABPROT 16.3*  --   --  14.4  --   --   --   INR 1.4*  --   --  1.2  --   --   --   HEPARINUNFRC  --   --   --  0.23*  --   --   --   CREATININE 7.04*  --   --  4.83*  --   --   --    < > = values in this interval not displayed.    Estimated Creatinine Clearance: 12 mL/min (A) (by C-G formula based on SCr of 4.83 mg/dL (H)).   Assessment: 59 yo W on warfarin PTA for hx mechanical AVR/MVR (goal 2.5-3.5), reversed with vit K 10 mg IV + 2 units FFP  + 4 units PRBcs on 8/18 and another vit K po 5mg  on 9/7 with 2 unit RBCs 9/8-9/9 due to continued retroperitoneal bleed.   No HL this morning because going to be drawn at dialysis. Unknown dialysis time due to high patient census today. Discussed with MD, ok to increase heparin rate given stable Hgb and SUBtherapeutic level on 9/9. If hemoglobin is stable this afternoon, ok to restart warfarin.   Last INR on 9/9 was 1.2 after vitamin K. Patient with poor PO intake in the hospital.   PTA Warfarin regimen: 9 mg daily except 6 mg on Mondays - last 8/18 (INR was 3.6 on admit 8/18)  Goal of Therapy:  Heparin level 0.3 -0.5 units/ml (low goal due to bleed) INR 2.5- 3.5  Monitor platelets by anticoagulation protocol: Yes   Plan:  Increase heparin to 1000 units/hr  F/u 6hr HL  (or when able to draw in dialysis) If evening Hgb is stable, give warfarin 6mg  x1  Monitor daily INR, HL, CBC/plt Stop heparin when INR is greater than 2.5  Monitor for signs/symptoms of bleeding    Benetta Spar, PharmD, BCPS, BCCP Clinical Pharmacist  Please check AMION for all [place] phone numbers After 10:00 PM, call [place]

## 2020-01-29 NOTE — Progress Notes (Signed)
Chief Complaint: Patient was seen today for left renal angio/embo  Supervising Physician: Jacqulynn Cadet  Patient Status: [facility] - In-pt  Subjective: S/p left renal angio/embo on 9/8 Pt feels pretty good. Some left flank but much improved.   Objective: Physical Exam: BP 116/73 (BP Location: Left Arm)   Pulse 92   Temp 98.1 F (36.7 C) (Oral)   Resp 18   Ht 5\' 6"  (1.676 m)   Wt 63.7 kg Comment: scale a  LMP 12/05/2010 (LMP Unknown)   SpO2 93%   BMI 22.66 kg/m  (R)groin soft, NT, no hematoma Excellent femoral/pedal pulses.   Current Facility-Administered Medications:  .  0.9 %  sodium chloride infusion (Manually program via Guardrails IV Fluids), , Intravenous, Once, Zierle-Ghosh, Asia B, DO .  0.9 %  sodium chloride infusion (Manually program via Guardrails IV Fluids), , Intravenous, Once, Shawna Clamp, MD .  0.9 %  sodium chloride infusion, , Intravenous, PRN, Charlynne Cousins, MD .  acetaminophen (TYLENOL) tablet 650 mg, 650 mg, Oral, Q6H PRN, Charlynne Cousins, MD, 650 mg at 01/29/20 0520 .  calcitRIOL (ROCALTROL) capsule 2 mcg, 2 mcg, Oral, Q M,W,F, Gean Quint, MD, 2 mcg at 01/27/20 1211 .  cinacalcet (SENSIPAR) tablet 180 mg, 180 mg, Oral, Q M,W,F-HD, Penninger, Lindsay, PA, 180 mg at 01/27/20 1210 .  docusate sodium (COLACE) capsule 100 mg, 100 mg, Oral, Daily, Bowser, Grace E, NP, 100 mg at 01/29/20 0807 .  ferric citrate (AURYXIA) tablet 420 mg, 420 mg, Oral, TID WC, Ernest Haber, PA-C, 420 mg at 01/29/20 0806 .  heparin ADULT infusion 100 units/mL (25000 units/259mL sodium chloride 0.45%), 950 Units/hr, Intravenous, Continuous, Shawna Clamp, MD, Last Rate: 9.5 mL/hr at 01/28/20 2130, 950 Units/hr at 01/28/20 2130 .  hydrALAZINE (APRESOLINE) injection 10 mg, 10 mg, Intravenous, Q6H PRN, Debbe Odea, MD, 10 mg at 01/24/20 1153 .  iohexol (OMNIPAQUE) 300 MG/ML solution 150 mL, 150 mL, Intra-arterial, Once PRN, Jacqulynn Cadet, MD .  metoprolol  tartrate (LOPRESSOR) tablet 25 mg, 25 mg, Oral, BID, Fay Records, MD, 25 mg at 01/28/20 2130 .  morphine 2 MG/ML injection 2-4 mg, 2-4 mg, Intravenous, Q3H PRN, Debbe Odea, MD, 4 mg at 01/28/20 0859 .  multivitamin (RENA-VIT) tablet 1 tablet, 1 tablet, Oral, QHS, Roney Jaffe, MD, 1 tablet at 01/28/20 2130 .  ondansetron (ZOFRAN) injection 4 mg, 4 mg, Intravenous, Q6H PRN, Rizwan, Saima, MD .  ondansetron (ZOFRAN-ODT) disintegrating tablet 4 mg, 4 mg, Oral, Q8H PRN, Icard, Bradley L, DO, 4 mg at 01/18/20 1818 .  oxyCODONE (Oxy IR/ROXICODONE) immediate release tablet 10 mg, 10 mg, Oral, Q4H PRN, Debbe Odea, MD, 10 mg at 01/29/20 0520 .  pantoprazole (PROTONIX) EC tablet 40 mg, 40 mg, Oral, QAC breakfast, Donnamae Jude, RPH, 40 mg at 01/29/20 8657 .  polyethylene glycol (MIRALAX / GLYCOLAX) packet 17 g, 17 g, Oral, BID, Charlynne Cousins, MD, 17 g at 01/16/20 2141 .  senna (SENOKOT) tablet 8.6 mg, 1 tablet, Oral, Daily, Bowser, Grace E, NP, 8.6 mg at 01/29/20 0808 .  simethicone (MYLICON) chewable tablet 80 mg, 80 mg, Oral, TID, Bowser, Laurel Dimmer, NP, 80 mg at 01/29/20 0807 .  Warfarin - Pharmacist Dosing Inpatient, , Does not apply, q1600, Karren Cobble, RPH, 1 each at 01/28/20 1709  Labs: CBC Recent Labs    01/27/20 0455 01/27/20 1853 01/28/20 0407 01/28/20 0407 01/28/20 1312 01/28/20 1908  WBC 17.5*  --  12.6*  --   --   --  HGB 6.6*   < > 6.8*   < > 8.2* 7.9*  HCT 20.6*   < > 21.1*   < > 25.1* 24.5*  PLT 228  --  214  --   --   --    < > = values in this interval not displayed.   BMET Recent Labs    01/27/20 0337 01/28/20 0407  NA 129* 133*  K 4.2 3.6  CL 90* 93*  CO2 27 28  GLUCOSE 108* 119*  BUN 28* 16  CREATININE 7.04* 4.83*  CALCIUM 9.5 8.2*   LFT Recent Labs    01/28/20 0407  ALBUMIN 2.4*   PT/INR Recent Labs    01/27/20 0337 01/28/20 0407  LABPROT 16.3* 14.4  INR 1.4* 1.2     Studies/Results: IR US Guide Vasc Access  Right  Result Date: 01/29/2020 Narrative & Impression INDICATION: 59 year old female with end-stage renal disease on hemodialysis. She is anuric. 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 Unfortunately, she has now developed severe spontaneous hemorrhage from the left kidney which appears to be slowly expanding and requiring transfusion. Therefore, she presents for left renal angiogram and planned embolization.  EXAM: SELECTIVE VISCERAL ARTERIOGRAPHY; IR EMBO ART VEN HEMORR LYMPH EXTRAV INC GUIDE ROADMAPPING  1. Left renal angiogram 2. Selective catheterization and angiography of inferior division ule left renal artery 3. Coil embolization of left renal arteries 4. Aortic angiogram  MEDICATIONS: None.  ANESTHESIA/SEDATION: Moderate (conscious) sedation was employed during this procedure. A total of Versed 1 mg and Fentanyl 25 mcg was administered intravenously.  Moderate Sedation Time: 40 minutes. The patient's level of consciousness and vital signs were monitored continuously by radiology nursing throughout the procedure under my direct supervision.  CONTRAST:  17mL OMNIPAQUE IOHEXOL 300 MG/ML  SOLN  FLUOROSCOPY TIME:  Fluoroscopy Time: 9 minutes 48 seconds (185.3 mGy).  COMPLICATIONS: None immediate.  PROCEDURE: Informed consent was obtained from the patient following explanation of the procedure, risks, benefits and alternatives. The patient understands, agrees and consents for the procedure. All questions were addressed. A time out was performed prior to the initiation of the procedure. Maximal barrier sterile technique utilized including caps, mask, sterile gowns, sterile gloves, large sterile drape, hand hygiene, and Betadine prep.  The right common femoral artery was interrogated with ultrasound and found to be widely patent. An image was obtained and stored for the medical record. Local anesthesia was  attained by infiltration with 1% lidocaine. A small dermatotomy was made. Under real-time sonographic guidance, the vessel was punctured with a 21 gauge micropuncture needle. Using standard technique, the initial micro needle was exchanged over a 0.018 micro wire for a transitional 4 [place] micro sheath. The micro sheath was then exchanged over a 0.035 wire for a 5 French vascular sheath.  A C2 cobra catheter was advanced over a Bentson wire into the abdominal aorta and used to select the left renal artery. A renal arteriogram was performed. The renal vessels are highly abnormal. The anterior and posterior divisions are stenotic and beaded. Interlobular arteries in the lower pole and interpolar region demonstrate small focal pseudoaneurysms with slow extravasation of contrast material consistent with the patient's clinical history of progressive left renal hemorrhage.  A renegade ST microcatheter was next advanced over a Fathom 16 wire into the inferior division ule artery. Contrast injection was performed confirming the presence of a pseudoaneurysm and slow extravasation of contrast material. Coil embolization was then performed  using 2 separate 2 x 4 mm penumbra LP coils. The coil pack successfully filled the targeted branch and we were also able to prolapse of few of the coils into the superior division ule branch as well. The distal main renal artery was then further coil embolized using fibered detachable interlock coils. A total of 4 coils were placed.  Follow-up angiography demonstrates successful occlusion of the left renal artery and its distal branches. No further opacification of the pseudo aneurysms and no evidence of continued bleeding. To ensure that there are no accessory renal arteries that also need to be treated, the C2 cobra catheter was then exchanged over a Bentson wire and an Omni flush catheter was advanced into the abdominal aorta. Abdominal aortography was next performed. No evidence of  accessory left renal artery or additional source of hemorrhage. Small caliber and likely significantly stenosed right renal artery. No evidence of abdominal aortic aneurysm. The bilateral visualized iliac arteries are widely patent.  IMPRESSION: 1. Highly abnormal divisional and interlobular left renal arteries with multiple areas of focal pseudoaneurysm formation and slow extravasation contrast material. 2. Successful coil embolization of the superior and inferior left divisional renal arteries and coil embolization of the distal aspect of the left main renal artery.  Signed,  Criselda Peaches, MD, [facility]  [facility]   Electronically Signed   By: Jacqulynn Cadet M.D.   On: 01/28/2020 10:06   IR EMBO ART  VEN HEMORR LYMPH EXTRAV  INC GUIDE ROADMAPPING  Result Date: 01/28/2020 INDICATION: 59 year old female with end-stage renal disease on hemodialysis. She is anuric. 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 Unfortunately, she has now developed severe spontaneous hemorrhage from the left kidney which appears to be slowly expanding and requiring transfusion. Therefore, she presents for left renal angiogram and planned embolization. EXAM: SELECTIVE VISCERAL ARTERIOGRAPHY; IR EMBO ART VEN HEMORR LYMPH EXTRAV INC GUIDE ROADMAPPING 1. Left renal angiogram 2. Selective catheterization and angiography of inferior division ule left renal artery 3. Coil embolization of left renal arteries 4. Aortic angiogram MEDICATIONS: None. ANESTHESIA/SEDATION: Moderate (conscious) sedation was employed during this procedure. A total of Versed 1 mg and Fentanyl 25 mcg was administered intravenously. Moderate Sedation Time: 40 minutes. The patient's level of consciousness and vital signs were monitored continuously by radiology nursing throughout the procedure under my  direct supervision. CONTRAST:  81mL OMNIPAQUE IOHEXOL 300 MG/ML  SOLN FLUOROSCOPY TIME:  Fluoroscopy Time: 9 minutes 48 seconds (185.3 mGy). COMPLICATIONS: None immediate. PROCEDURE: Informed consent was obtained from the patient following explanation of the procedure, risks, benefits and alternatives. The patient understands, agrees and consents for the procedure. All questions were addressed. A time out was performed prior to the initiation of the procedure. Maximal barrier sterile technique utilized including caps, mask, sterile gowns, sterile gloves, large sterile drape, hand hygiene, and Betadine prep. The right common femoral artery was interrogated with ultrasound and found to be widely patent. An image was obtained and stored for the medical record. Local anesthesia was attained by infiltration with 1% lidocaine. A small dermatotomy was made. Under real-time sonographic guidance, the vessel was punctured with a 21 gauge micropuncture needle. Using standard technique, the initial micro needle was exchanged over a 0.018 micro wire for a transitional 4 [place] micro sheath. The micro sheath was then exchanged over a 0.035 wire for a 5 French vascular sheath. A C2 cobra catheter was advanced over  a Bentson wire into the abdominal aorta and used to select the left renal artery. A renal arteriogram was performed. The renal vessels are highly abnormal. The anterior and posterior divisions are stenotic and beaded. Interlobular arteries in the lower pole and interpolar region demonstrate small focal pseudoaneurysms with slow extravasation of contrast material consistent with the patient's clinical history of progressive left renal hemorrhage. A renegade ST microcatheter was next advanced over a Fathom 16 wire into the inferior division ule artery. Contrast injection was performed confirming the presence of a pseudoaneurysm and slow extravasation of contrast material. Coil embolization was then performed using 2  separate 2 x 4 mm penumbra LP coils. The coil pack successfully filled the targeted branch and we were also able to prolapse of few of the coils into the superior division ule branch as well. The distal main renal artery was then further coil embolized using fibered detachable interlock coils. A total of 4 coils were placed. Follow-up angiography demonstrates successful occlusion of the left renal artery and its distal branches. No further opacification of the pseudo aneurysms and no evidence of continued bleeding. To ensure that there are no accessory renal arteries that also need to be treated, the C2 cobra catheter was then exchanged over a Bentson wire and an Omni flush catheter was advanced into the abdominal aorta. Abdominal aortography was next performed. No evidence of accessory left renal artery or additional source of hemorrhage. Small caliber and likely significantly stenosed right renal artery. No evidence of abdominal aortic aneurysm. The bilateral visualized iliac arteries are widely patent. IMPRESSION: 1. Highly abnormal divisional and interlobular left renal arteries with multiple areas of focal pseudoaneurysm formation and slow extravasation contrast material. 2. Successful coil embolization of the superior and inferior left divisional renal arteries and coil embolization of the distal aspect of the left main renal artery. Signed, Criselda Peaches, MD, [facility] Electronically Signed   By: Jacqulynn Cadet M.D.   On: 01/28/2020 10:06    Assessment/Plan: S/p left renal angio/embo on 9/8 Doing well Hgb 8.2 after 2u PRBC Does not seem to have much post-embolic discomfort. Right groin stable, may remove dressing and shower.  Call IR with questions    LOS: 23 days   I spent a total of 15 minutes in face to face in clinical consultation, greater than 50% of which was counseling/coordinating care for left renal  angio/embo  Ascencion Dike PA-C 01/29/2020 10:25 AM

## 2020-01-29 NOTE — Progress Notes (Signed)
Physical Therapy Treatment Patient Details Name: [name] MRN: [id number] DOB: 06-28-1960 Today's Date: 01/29/2020    History of Present Illness Patient is a 59 year old female with history of ESRD on HD MWF, s/p aortic and mitral valve replacement on warfarin therapy who presented with nausea, nonbloody emesis and abdominal pain; found to have spontaneous left retroperitoneal bleed.  On 9/8 underwent Left renal angiogram and coil embolization.     PT Comments    Pt admitted with above diagnosis. Pt was able to ambulate with good stability overall. Occasionally pt with drifting to right needing min guard assist but she self corrects.  DGI score of 19/24 suggesting low risk of falls.  Pt making progress. Discussed exercise program for home.  Pt currently with functional limitations due to balance and endurance deficits. Pt will benefit from skilled PT to increase their independence and safety with mobility to allow discharge to the venue listed below.     Follow Up Recommendations  Home health PT;Supervision - Intermittent     Equipment Recommendations  None recommended by PT    Recommendations for Other Services       Precautions / Restrictions Precautions Precautions: Fall Restrictions Weight Bearing Restrictions: No    Mobility  Bed Mobility Overal bed mobility: Modified Independent             General bed mobility comments: HOB elevated, became nauseated when getting up and vomitted bile  Transfers Overall transfer level: Needs assistance Equipment used: None Transfers: Sit to/from [employer] Transfers Sit to Stand: Supervision Stand pivot transfers: Supervision          Ambulation/Gait Ambulation/Gait assistance: Supervision;Min guard Gait Distance (Feet): 500 Feet Assistive device: None Gait Pattern/deviations: Step-through pattern;Decreased stride length Gait velocity: reduced Gait velocity interpretation: <1.31 ft/sec, indicative of  household ambulator General Gait Details: Pt without LOB. Did not need device. min guard with min challenges.    Stairs Stairs: Yes Stairs assistance: Min guard Stair Management: One rail Right;Alternating pattern;Forwards Number of Stairs: 3 General stair comments: Able to ascend steps without rail and descends with use of rail   Wheelchair Mobility    Modified Rankin (Stroke Patients Only)       Balance Overall balance assessment: No apparent balance deficits (not formally assessed)                               Standardized Balance Assessment Standardized Balance Assessment : Dynamic Gait Index   Dynamic Gait Index Level Surface: Normal Change in Gait Speed: Normal Gait with Horizontal Head Turns: Mild Impairment Gait with Vertical Head Turns: Mild Impairment Gait and Pivot Turn: Mild Impairment Step Over Obstacle: Mild Impairment Step Around Obstacles: Normal Steps: Mild Impairment Total Score: 19      Cognition Arousal/Alertness: Awake/alert Behavior During Therapy: WFL for tasks assessed/performed Overall Cognitive Status: Within Functional Limits for tasks assessed                                        Exercises Other Exercises Other Exercises: discussed HEP and issued HEP    General Comments        Pertinent Vitals/Pain Pain Assessment: Faces Faces Pain Scale: Hurts little more Pain Location: left groin Pain Descriptors / Indicators: Grimacing;Guarding Pain Intervention(s): Limited activity within patient's tolerance;Monitored during session;Repositioned    Home Living  Prior Function            PT Goals (current goals can now be found in the care plan section) Acute Rehab PT Goals Patient Stated Goal: manage her symptoms Progress towards PT goals: Progressing toward goals    Frequency    Min 3X/week      PT Plan Frequency needs to be updated;Current plan remains  appropriate    Co-evaluation              AM-PAC PT "6 Clicks" Mobility   Outcome Measure  Help needed turning from your back to your side while in a flat bed without using bedrails?: None Help needed moving from lying on your back to sitting on the side of a flat bed without using bedrails?: None Help needed moving to and from a bed to a chair (including a wheelchair)?: None Help needed standing up from a chair using your arms (e.g., wheelchair or bedside chair)?: None Help needed to walk in hospital room?: A Little Help needed climbing 3-5 steps with a railing? : A Little 6 Click Score: 22    End of Session Equipment Utilized During Treatment: Gait belt Activity Tolerance: Patient tolerated treatment well Patient left: with call bell/phone within reach;in bed (sitting EOB) Nurse Communication: Mobility status PT Visit Diagnosis: Muscle weakness (generalized) (M62.81)     Time: 1341-1406 PT Time Calculation (min) (ACUTE ONLY): 25 min  Charges:  $Gait Training: 23-37 mins                     Janice Bodine W,PT Acute Rehabilitation Services Pager:  [phone]  Office:  [place] 01/29/2020, 3:46 PM

## 2020-01-29 NOTE — Progress Notes (Signed)
PROGRESS NOTE    [name]  GBT:[id number] DOB: May 09, 1961 DOA: 01/06/2020 PCP: Benito Mccreedy, MD   Brief Narrative: [name] an 59 y.o.female withpast medical history of end-stage renal disease on hemodialysis Monday Wednesday and Friday status post aortic and mitral valve replacement on Coumadin who presents with nausea and nonbloody emesis, She was found to have a spontaneous left retroperitoneal bleed. She status post 6 units of packed red blood cells,  2 units of fresh frozen plasma and vitamin K.  Her hemoglobin continues to drop. She underwent  IR guided angiogram and embolization of the bleeding artery 01/27/20. She is on heparin gtt , Coumadin is on hold, Her Hb slightly dropped overnight to 6.8, she has received 2 more units PRBC, Hb 8.2. remained stable. Will resume coumadin tonight until INR therapeutic.   Assessment & Plan:   Principal Problem:   Retroperitoneal hemorrhage Active Problems:   End-stage renal disease on hemodialysis (HCC)   H/O heart valve replacement with mechanical valve   S/P mitral valve replacement with metallic valve   Nontraumatic retroperitoneal hematoma   Status post mechanical aortic valve replacement     Retroperitoneal hemorrhage- mechanical aortic and mitral valve - this appears to have been a spontaneous hemorrhage. - s/p 6 U PRBC, Vit K and 2 U FFP.  -  It has resolved and Coumadin was resumed with Heparin bridge on 8/23 - ultrasound of abdomen reveals stable retroperitoneal bleed. - 9/2> CT abd/pelvis shows a stable hematoma. - patient had recurrent severe left flank pain which she states feels similar to the pain she had when she was found to have the RP hemorrhage -  Dilaudid IV for her pain was ordered and increased her Oxycodone - Hb dropped to 8  9/3  - reordered a CT scan and her hematoma does appear larger than before thus, consulted IR for an embolization - per IR, it is difficult to tell if she is still  bleeding , Dr Earleen Newport requests that we decrease her INR to below 2.0 for the angiogram  - She was given Vitamin K 5 mg  - cont to follow Hemoglobin closely and transfuse if < 7.  - She underwent angiogram followed by embolization of bleeding artery by IR  01/27/20, tolerated well - Her hb dropped to 6.8 despite embolization. She received 2 more units PRBC  Hb 8.2 -  Continue heparin gtt, Hb remained stable afterwards, flank pain better,   - will resume coumadin tonight until INR therapeutic.  - continue to moniter H and H   Nausea and Vomiting - Improved - patient claims the pain is causing this but it may also be the Dilauidid- continue PRN Zofran  Gross hematuria>>> Improving  - obtained CT abd./pelvis to see if a source of bleeding can be found but this was unremarkable. - current amount of blood is small. -  Urology consulted if she continues to have this issue when close to d/c,  - She will make follow up appointment for urology to investigate this further     End-stage renal disease on hemodialysis  Secondary hyperparathyroidism - cont HD on MWF   DVT prophylaxis: Heparin gtt / coumadin on hold. Code Status: Full Family Communication: No family at bed side. Disposition Plan:  Dispo: The patient is from: Home  Anticipated d/c is to: Home  Anticipated d/c date is: 3 days  Patient currently is not medically stable to d/c. Ongoing retroperitoneal bleeding   Consultants:    Nephrology,  Cardiology, Urology, IR  Procedures:   Antimicrobials: Anti-infectives (From admission, onward)   None     Subjective: Patient was seen and examined in the morning..  Overnight events noted.   She reports feeling better, flank pain is much better. She is going to have hemodialysis today.  She denies any nausea, vomiting and diarrhea. Objective: Vitals:   01/28/20 1158 01/28/20 2016 01/29/20 0510 01/29/20 1132  BP: 113/73 123/73 116/73 140/79    Pulse: 83 98 92 100  Resp: 16 18 18 18   Temp: 98.8 F (37.1 C) 98.4 F (36.9 C) 98.1 F (36.7 C) 98.3 F (36.8 C)  TempSrc: Oral Oral Oral Oral  SpO2: 100% 100% 93% 99%  Weight:   63.7 kg   Height:        Intake/Output Summary (Last 24 hours) at 01/29/2020 1522 Last data filed at 01/29/2020 1310 Gross per 24 hour  Intake 597 ml  Output 50 ml  Net 547 ml   Filed Weights   01/27/20 1101 01/28/20 0455 01/29/20 0510  Weight: 63.6 kg 62.5 kg 63.7 kg    Examination:  General exam: Appears calm and comfortable. Alert, oriented.  Respiratory system: Clear to auscultation. Respiratory effort normal. Cardiovascular system: S1 & S2 heard, RRR. No JVD, murmurs, rubs, gallops or clicks. No pedal edema. Gastrointestinal system: Abdomen is nondistended, soft and mildly tender in left flank.  No organomegaly or masses felt. Normal bowel sounds heard. Central nervous system: Alert and oriented. No focal neurological deficits. Extremities:  No leg edema, no cyanosis, no clubbing. Skin: No rashes, lesions or ulcers Psychiatry: Judgement and insight appear normal. Mood & affect appropriate.     Data Reviewed: I have personally reviewed following labs and imaging studies  CBC: Recent Labs  Lab 01/25/20 0430 01/25/20 1822 01/26/20 0747 01/26/20 0747 01/27/20 0455 01/27/20 0455 01/27/20 1853 01/28/20 0407 01/28/20 1312 01/28/20 1908 01/29/20 1333  WBC 9.1  --  10.5  --  17.5*  --   --  12.6*  --   --  9.3  HGB 9.7*   < > 8.6*   < > 6.6*   < > 7.5* 6.8* 8.2* 7.9* 7.9*  HCT 30.6*   < > 27.0*   < > 20.6*   < > 22.9* 21.1* 25.1* 24.5* 24.4*  MCV 98.1  --  100.7*  --  101.5*  --   --  95.9  --   --  96.1  PLT 236  --  218  --  228  --   --  214  --   --  242   < > = values in this interval not displayed.   Basic Metabolic Panel: Recent Labs  Lab 01/25/20 0430 01/26/20 0511 01/27/20 0337 01/28/20 0407 01/29/20 1333  NA 130* 135 129* 133* 129*  129*  K 4.8 3.7 4.2 3.6 3.7   3.8  CL 90* 95* 90* 93* 91*  91*  CO2 25 28 27 28 25  25   GLUCOSE 90 99 108* 119* 116*  117*  BUN 51* 16 28* 16 33*  33*  CREATININE 9.34* 4.92* 7.04* 4.83* 7.50*  7.42*  CALCIUM 10.1 9.2 9.5 8.2* 8.4*  8.4*  MG  --   --   --  1.8 1.8  PHOS 4.9* 3.8 5.7* 4.4 4.1  4.1   GFR: Estimated Creatinine Clearance: 7.8 mL/min (A) (by C-G formula based on SCr of 7.42 mg/dL (H)). Liver Function Tests: Recent Labs  Lab 01/25/20 0430 01/26/20 0511 01/27/20  9563 01/28/20 0407 01/29/20 1333  AST  --   --   --   --  22  ALT  --   --   --   --  16  ALKPHOS  --   --   --   --  97  BILITOT  --   --   --   --  0.8  PROT  --   --   --   --  6.9  ALBUMIN 2.9* 2.7* 2.5* 2.4* 2.5*  2.5*   No results for input(s): LIPASE, AMYLASE in the last 168 hours. No results for input(s): AMMONIA in the last 168 hours. Coagulation Profile: Recent Labs  Lab 01/25/20 0430 01/26/20 0511 01/27/20 0337 01/28/20 0407 01/29/20 1333  INR 2.2* 2.9* 1.4* 1.2 1.1   Cardiac Enzymes: No results for input(s): CKTOTAL, CKMB, CKMBINDEX, TROPONINI in the last 168 hours. BNP (last 3 results) No results for input(s): PROBNP in the last 8760 hours. HbA1C: No results for input(s): HGBA1C in the last 72 hours. CBG: No results for input(s): GLUCAP in the last 168 hours. Lipid Profile: No results for input(s): CHOL, HDL, LDLCALC, TRIG, CHOLHDL, LDLDIRECT in the last 72 hours. Thyroid Function Tests: No results for input(s): TSH, T4TOTAL, FREET4, T3FREE, THYROIDAB in the last 72 hours. Anemia Panel: No results for input(s): VITAMINB12, FOLATE, FERRITIN, TIBC, IRON, RETICCTPCT in the last 72 hours. Sepsis Labs: No results for input(s): PROCALCITON, LATICACIDVEN in the last 168 hours.  No results found for this or any previous visit (from the past 240 hour(s)).   Radiology Studies: IR US Guide Vasc Access Right  Result Date: 01/29/2020 Narrative & Impression INDICATION: 59 year old female with end-stage renal  disease on hemodialysis. She is anuric. 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 Unfortunately, she has now developed severe spontaneous hemorrhage from the left kidney which appears to be slowly expanding and requiring transfusion. Therefore, she presents for left renal angiogram and planned embolization.  EXAM: SELECTIVE VISCERAL ARTERIOGRAPHY; IR EMBO ART VEN HEMORR LYMPH EXTRAV INC GUIDE ROADMAPPING  1. Left renal angiogram 2. Selective catheterization and angiography of inferior division ule left renal artery 3. Coil embolization of left renal arteries 4. Aortic angiogram  MEDICATIONS: None.  ANESTHESIA/SEDATION: Moderate (conscious) sedation was employed during this procedure. A total of Versed 1 mg and Fentanyl 25 mcg was administered intravenously.  Moderate Sedation Time: 40 minutes. The patient's level of consciousness and vital signs were monitored continuously by radiology nursing throughout the procedure under my direct supervision.  CONTRAST:  27mL OMNIPAQUE IOHEXOL 300 MG/ML  SOLN  FLUOROSCOPY TIME:  Fluoroscopy Time: 9 minutes 48 seconds (185.3 mGy).  COMPLICATIONS: None immediate.  PROCEDURE: Informed consent was obtained from the patient following explanation of the procedure, risks, benefits and alternatives. The patient understands, agrees and consents for the procedure. All questions were addressed. A time out was performed prior to the initiation of the procedure. Maximal barrier sterile technique utilized including caps, mask, sterile gowns, sterile gloves, large sterile drape, hand hygiene, and Betadine prep.  The right common femoral artery was interrogated with ultrasound and found to be widely patent. An image was obtained and stored for the medical record. Local anesthesia was attained by infiltration with 1% lidocaine. A small dermatotomy was made. Under real-time sonographic  guidance, the vessel was punctured with a 21 gauge micropuncture needle. Using standard technique, the initial micro needle was exchanged over a 0.018 micro wire for  a transitional 4 [place] micro sheath. The micro sheath was then exchanged over a 0.035 wire for a 5 French vascular sheath.  A C2 cobra catheter was advanced over a Bentson wire into the abdominal aorta and used to select the left renal artery. A renal arteriogram was performed. The renal vessels are highly abnormal. The anterior and posterior divisions are stenotic and beaded. Interlobular arteries in the lower pole and interpolar region demonstrate small focal pseudoaneurysms with slow extravasation of contrast material consistent with the patient's clinical history of progressive left renal hemorrhage.  A renegade ST microcatheter was next advanced over a Fathom 16 wire into the inferior division ule artery. Contrast injection was performed confirming the presence of a pseudoaneurysm and slow extravasation of contrast material. Coil embolization was then performed using 2 separate 2 x 4 mm penumbra LP coils. The coil pack successfully filled the targeted branch and we were also able to prolapse of few of the coils into the superior division ule branch as well. The distal main renal artery was then further coil embolized using fibered detachable interlock coils. A total of 4 coils were placed.  Follow-up angiography demonstrates successful occlusion of the left renal artery and its distal branches. No further opacification of the pseudo aneurysms and no evidence of continued bleeding. To ensure that there are no accessory renal arteries that also need to be treated, the C2 cobra catheter was then exchanged over a Bentson wire and an Omni flush catheter was advanced into the abdominal aorta. Abdominal aortography was next performed. No evidence of accessory left renal artery or additional source of hemorrhage. Small caliber and likely significantly  stenosed right renal artery. No evidence of abdominal aortic aneurysm. The bilateral visualized iliac arteries are widely patent.  IMPRESSION: 1. Highly abnormal divisional and interlobular left renal arteries with multiple areas of focal pseudoaneurysm formation and slow extravasation contrast material. 2. Successful coil embolization of the superior and inferior left divisional renal arteries and coil embolization of the distal aspect of the left main renal artery.  Signed,  Criselda Peaches, MD, [facility]  [facility]   Electronically Signed   By: Jacqulynn Cadet M.D.   On: 01/28/2020 10:06   IR EMBO ART  VEN HEMORR LYMPH EXTRAV  INC GUIDE ROADMAPPING  Result Date: 01/28/2020 INDICATION: 59 year old female with end-stage renal disease on hemodialysis. She is anuric. 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 Unfortunately, she has now developed severe spontaneous hemorrhage from the left kidney which appears to be slowly expanding and requiring transfusion. Therefore, she presents for left renal angiogram and planned embolization. EXAM: SELECTIVE VISCERAL ARTERIOGRAPHY; IR EMBO ART VEN HEMORR LYMPH EXTRAV INC GUIDE ROADMAPPING 1. Left renal angiogram 2. Selective catheterization and angiography of inferior division ule left renal artery 3. Coil embolization of left renal arteries 4. Aortic angiogram MEDICATIONS: None. ANESTHESIA/SEDATION: Moderate (conscious) sedation was employed during this procedure. A total of Versed 1 mg and Fentanyl 25 mcg was administered intravenously. Moderate Sedation Time: 40 minutes. The patient's level of consciousness and vital signs were monitored continuously by radiology nursing throughout the procedure under my direct supervision. CONTRAST:  41mL OMNIPAQUE IOHEXOL 300 MG/ML  SOLN FLUOROSCOPY TIME:  Fluoroscopy Time: 9  minutes 48 seconds (185.3 mGy). COMPLICATIONS: None immediate. PROCEDURE: Informed consent was obtained from the patient following explanation of the procedure, risks, benefits and alternatives. The patient understands, agrees and consents for  the procedure. All questions were addressed. A time out was performed prior to the initiation of the procedure. Maximal barrier sterile technique utilized including caps, mask, sterile gowns, sterile gloves, large sterile drape, hand hygiene, and Betadine prep. The right common femoral artery was interrogated with ultrasound and found to be widely patent. An image was obtained and stored for the medical record. Local anesthesia was attained by infiltration with 1% lidocaine. A small dermatotomy was made. Under real-time sonographic guidance, the vessel was punctured with a 21 gauge micropuncture needle. Using standard technique, the initial micro needle was exchanged over a 0.018 micro wire for a transitional 4 [place] micro sheath. The micro sheath was then exchanged over a 0.035 wire for a 5 French vascular sheath. A C2 cobra catheter was advanced over a Bentson wire into the abdominal aorta and used to select the left renal artery. A renal arteriogram was performed. The renal vessels are highly abnormal. The anterior and posterior divisions are stenotic and beaded. Interlobular arteries in the lower pole and interpolar region demonstrate small focal pseudoaneurysms with slow extravasation of contrast material consistent with the patient's clinical history of progressive left renal hemorrhage. A renegade ST microcatheter was next advanced over a Fathom 16 wire into the inferior division ule artery. Contrast injection was performed confirming the presence of a pseudoaneurysm and slow extravasation of contrast material. Coil embolization was then performed using 2 separate 2 x 4 mm penumbra LP coils. The coil pack successfully filled the targeted branch and we were also able to  prolapse of few of the coils into the superior division ule branch as well. The distal main renal artery was then further coil embolized using fibered detachable interlock coils. A total of 4 coils were placed. Follow-up angiography demonstrates successful occlusion of the left renal artery and its distal branches. No further opacification of the pseudo aneurysms and no evidence of continued bleeding. To ensure that there are no accessory renal arteries that also need to be treated, the C2 cobra catheter was then exchanged over a Bentson wire and an Omni flush catheter was advanced into the abdominal aorta. Abdominal aortography was next performed. No evidence of accessory left renal artery or additional source of hemorrhage. Small caliber and likely significantly stenosed right renal artery. No evidence of abdominal aortic aneurysm. The bilateral visualized iliac arteries are widely patent. IMPRESSION: 1. Highly abnormal divisional and interlobular left renal arteries with multiple areas of focal pseudoaneurysm formation and slow extravasation contrast material. 2. Successful coil embolization of the superior and inferior left divisional renal arteries and coil embolization of the distal aspect of the left main renal artery. Signed, Criselda Peaches, MD, [facility] Electronically Signed   By: Jacqulynn Cadet M.D.   On: 01/28/2020 10:06    Scheduled Meds: . sodium chloride   Intravenous Once  . sodium chloride   Intravenous Once  . calcitRIOL  2 mcg Oral Q M,W,F  . cinacalcet  180 mg Oral Q M,W,F-HD  . docusate sodium  100 mg Oral Daily  . ferric citrate  420 mg Oral TID WC  . metoprolol tartrate  25 mg Oral BID  . multivitamin  1 tablet Oral QHS  . pantoprazole  40 mg Oral QAC breakfast  . polyethylene glycol  17 g Oral BID  . senna  1 tablet Oral Daily  . simethicone  80 mg Oral TID  . Warfarin - Pharmacist Dosing Inpatient    Does not apply  q1600   Continuous Infusions: . sodium chloride    . heparin 1,000 Units/hr (01/29/20 1131)     LOS: 23 days    Time spent: 25 mins.    Shawna Clamp, MD [facility]   If 7PM-7AM, please contact night-coverage

## 2020-01-29 NOTE — Progress Notes (Addendum)
ANTICOAGULATION CONSULT NOTE - Initial Consult  Pharmacy Consult for heparin, warfarin Indication: mechanical AVR/MVR  No Known Allergies  Patient Measurements: Height: 5\' 6"  (167.6 cm) Weight: 63.7 kg (140 lb 6.4 oz) (scale a) IBW/kg (Calculated) : 59.3 Heparin Dosing Weight: 62kg  Vital Signs: Temp: 98.3 F (36.8 C) (09/10 1132) Temp Source: Oral (09/10 1132) BP: 140/79 (09/10 1132) Pulse Rate: 100 (09/10 1132)  Labs: Recent Labs    01/27/20 0337 01/27/20 0455 01/27/20 1853 01/28/20 0407 01/28/20 0407 01/28/20 1312 01/28/20 1312 01/28/20 1908 01/29/20 1333  HGB  --  6.6*   < > 6.8*   < > 8.2*   < > 7.9* 7.9*  HCT  --  20.6*   < > 21.1*   < > 25.1*  --  24.5* 24.4*  PLT  --  228  --  214  --   --   --   --  242  LABPROT 16.3*  --   --  14.4  --   --   --   --  13.4  INR 1.4*  --   --  1.2  --   --   --   --  1.1  HEPARINUNFRC  --   --   --  0.23*  --   --   --   --  0.27*  CREATININE 7.04*  --   --  4.83*  --   --   --   --  7.50*  7.42*   < > = values in this interval not displayed.    Estimated Creatinine Clearance: 7.8 mL/min (A) (by C-G formula based on SCr of 7.42 mg/dL (H)).   Assessment: 59 yo W on warfarin PTA for hx mechanical AVR/MVR (goal 2.5-3.5), reversed with vit K 10 mg IV + 2 units FFP  + 4 units PRBcs on 8/18 and another vit K po 5mg  on 9/7 with 2 unit RBCs 9/8-9/9 due to continued retroperitoneal bleed.   No HL this morning because going to be drawn at dialysis. Unknown dialysis time due to high patient census today,(likely after 1800 per HD). Discussed with MD, ok to increase heparin rate given stable Hgb and SUBtherapeutic level on 9/9. Hemoglobin is stable this afternoon (7.9>>7.9), so ok to restart warfarin.   Last INR on 9/9 was 1.2 after vitamin K. Patient with poor PO intake in the hospital.   PTA Warfarin regimen: 9 mg daily except 6 mg on Mondays - last 8/18 (INR was 3.6 on admit 8/18)  Since hemoglobin is stable, will give warfarin  6mg  at 1600 today. Heparin level drawn only 2 hours after change in dose. Will wait for heparin level this PM with HD to make change in rate.   Goal of Therapy:  Heparin level 0.3 -0.5 units/ml (low goal due to bleed) INR 2.5- 3.5  Monitor platelets by anticoagulation protocol: Yes   Plan:  Continue heparin at 1000 units/hr  F/u 6hr HL (or when able to draw in dialysis) Give warfarin 6mg  x1  Monitor daily INR, HL, CBC/plt Stop heparin when INR is greater than 2.5  Monitor for signs/symptoms of bleeding    [name] A. Levada Dy, PharmD, BCPS, FNKF Clinical Pharmacist  Please utilize Amion for appropriate phone number to reach the unit pharmacist ([place])  Addendum:  Heparin level at 6 hours post rate change is 0.24 (subtherapeutic). Due to risk of bleeding will not rebolus, but will increase rate to 1100 units/hr. HL in 6 hours from rate change.  [name] A. Levada Dy, PharmD, BCPS, FNKF Clinical Pharmacist [place] Please utilize Amion for appropriate phone number to reach the unit pharmacist ([place])

## 2020-01-29 NOTE — Progress Notes (Signed)
Subjective: abd pain better again.  Hb 8.2 then 7.9.    Objective Vital signs in last 24 hours: Vitals:   01/28/20 0815 01/28/20 1158 01/28/20 2016 01/29/20 0510  BP: 115/71 113/73 123/73 116/73  Pulse: 92 83 98 92  Resp:  16 18 18   Temp: 98.2 F (36.8 C) 98.8 F (37.1 C) 98.4 F (36.9 C) 98.1 F (36.7 C)  TempSrc: Oral Oral Oral Oral  SpO2: 100% 100% 100% 93%  Weight:    63.7 kg  Height:       Weight change: 0.385 kg  Physical Exam: General: alert, not in distress Heart: RRR, 2/6 SEM, mechanical heart sound Lungs: CTA bilaterally nonlabored breathing Abdomen: Bowel sounds positive, nondistended, tender left flank Extremities: No lower extremity edema Dialysis Access: RUA aVF+ bruit   OP HD: MWF    4h  61kg  450/800   2/2 bath  P2  Hep 1800  -sensipar 180mg   -calcitriol 9mcg  -no esa, no iron   Problem/Plan: 1.L retroperitoneal perirenal bleed / hematoma - Bleed seen on CT 8/18. S/p 2 units FFP, 4 units pRBC total and Vit K. IV heparin started 8/21 and stopped 8/22, given more pRBC. IV hep and coumadin restarted 8/23.Seen by Urology. Rpt CT Abd 9/02 w/ IV contrast identified an organizing hematoma, no renal mass could be seen. Pt stable until INR up to 2.9 then L abd pain recurred and Hb began dropping again > therefore pt rec'd IV vit K 5mg  and the next day was taken for L kidney embolization procedure done by IR on 0/98 w/o complications - since embolization pain and nausea have improved sig - following Hb 8.2 > 7.9  - d/w primary md about timing of resuming coumadin possibly tonight - on IV hep now for mech valve a/c  2. 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 ESRD- on HD MWF. HD today. No Heparin.  5. Anemiaof CKD- As above S/p 6 units pRBC. TSAT 84% 8/30.  6. Secondary hyperparathyroidism- Phos at goal. 3.4 cont low Ca bath. . Continue VDRA and sensipar. Noted  on Auryxia 840 mg with phosphorus controlled in hospital and with high TSAT level but transfused will decrease to 420 G follow up trend no changes for now 7.HTN/volume- BP stable, UF to 61kg  8. Nutrition- Renal diet w/fluid restrictions. Alb 2.8. Protein supplements.    [name], MD 01/29/2020, 10:50 AM      Labs: Basic Metabolic Panel: Recent Labs  Lab 01/26/20 0511 01/27/20 0337 01/28/20 0407  NA 135 129* 133*  K 3.7 4.2 3.6  CL 95* 90* 93*  CO2 28 27 28   GLUCOSE 99 108* 119*  BUN 16 28* 16  CREATININE 4.92* 7.04* 4.83*  CALCIUM 9.2 9.5 8.2*  PHOS 3.8 5.7* 4.4   Liver Function Tests: Recent Labs  Lab 01/26/20 0511 01/27/20 0337 01/28/20 0407  ALBUMIN 2.7* 2.5* 2.4*   No results for input(s): LIPASE, AMYLASE in the last 168 hours. No results for input(s): AMMONIA in the last 168 hours. CBC: Recent Labs  Lab 01/24/20 0614 01/24/20 0614 01/25/20 0430 01/25/20 1822 01/26/20 0747 01/26/20 0747 01/27/20 0455 01/27/20 1853 01/28/20 0407 01/28/20 1312 01/28/20 1908  WBC 6.6   < > 9.1  --  10.5  --  17.5*  --  12.6*  --   --   HGB 9.5*   < > 9.7*   < > 8.6*   < >  6.6*   < > 6.8* 8.2* 7.9*  HCT 30.0*   < > 30.6*   < > 27.0*   < > 20.6*   < > 21.1* 25.1* 24.5*  MCV 98.7  --  98.1  --  100.7*  --  101.5*  --  95.9  --   --   PLT 236   < > 236  --  218  --  228  --  214  --   --    < > = values in this interval not displayed.   Cardiac Enzymes: No results for input(s): CKTOTAL, CKMB, CKMBINDEX, TROPONINI in the last 168 hours. CBG: No results for input(s): GLUCAP in the last 168 hours.  Studies/Results: IR US Guide Vasc Access Right  Result Date: 01/29/2020 Narrative & Impression INDICATION: 59 year old female with end-stage renal disease on hemodialysis. She is anuric. 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 Unfortunately, she has now developed severe  spontaneous hemorrhage from the left kidney which appears to be slowly expanding and requiring transfusion. Therefore, she presents for left renal angiogram and planned embolization.  EXAM: SELECTIVE VISCERAL ARTERIOGRAPHY; IR EMBO ART VEN HEMORR LYMPH EXTRAV INC GUIDE ROADMAPPING  1. Left renal angiogram 2. Selective catheterization and angiography of inferior division ule left renal artery 3. Coil embolization of left renal arteries 4. Aortic angiogram  MEDICATIONS: None.  ANESTHESIA/SEDATION: Moderate (conscious) sedation was employed during this procedure. A total of Versed 1 mg and Fentanyl 25 mcg was administered intravenously.  Moderate Sedation Time: 40 minutes. The patient's level of consciousness and vital signs were monitored continuously by radiology nursing throughout the procedure under my direct supervision.  CONTRAST:  94mL OMNIPAQUE IOHEXOL 300 MG/ML  SOLN  FLUOROSCOPY TIME:  Fluoroscopy Time: 9 minutes 48 seconds (185.3 mGy).  COMPLICATIONS: None immediate.  PROCEDURE: Informed consent was obtained from the patient following explanation of the procedure, risks, benefits and alternatives. The patient understands, agrees and consents for the procedure. All questions were addressed. A time out was performed prior to the initiation of the procedure. Maximal barrier sterile technique utilized including caps, mask, sterile gowns, sterile gloves, large sterile drape, hand hygiene, and Betadine prep.  The right common femoral artery was interrogated with ultrasound and found to be widely patent. An image was obtained and stored for the medical record. Local anesthesia was attained by infiltration with 1% lidocaine. A small dermatotomy was made. Under real-time sonographic guidance, the vessel was punctured with a 21 gauge micropuncture needle. Using standard technique, the initial micro needle was exchanged over a 0.018 micro wire for a transitional 4 [place] micro sheath. The micro sheath was  then exchanged over a 0.035 wire for a 5 French vascular sheath.  A C2 cobra catheter was advanced over a Bentson wire into the abdominal aorta and used to select the left renal artery. A renal arteriogram was performed. The renal vessels are highly abnormal. The anterior and posterior divisions are stenotic and beaded. Interlobular arteries in the lower pole and interpolar region demonstrate small focal pseudoaneurysms with slow extravasation of contrast material consistent with the patient's clinical history of progressive left renal hemorrhage.  A renegade ST microcatheter was next advanced over a Fathom 16 wire into the inferior division ule artery. Contrast injection was performed confirming the presence of a pseudoaneurysm and slow extravasation of contrast material. Coil embolization was then performed using 2 separate 2 x 4 mm penumbra LP coils. The  coil pack successfully filled the targeted branch and we were also able to prolapse of few of the coils into the superior division ule branch as well. The distal main renal artery was then further coil embolized using fibered detachable interlock coils. A total of 4 coils were placed.  Follow-up angiography demonstrates successful occlusion of the left renal artery and its distal branches. No further opacification of the pseudo aneurysms and no evidence of continued bleeding. To ensure that there are no accessory renal arteries that also need to be treated, the C2 cobra catheter was then exchanged over a Bentson wire and an Omni flush catheter was advanced into the abdominal aorta. Abdominal aortography was next performed. No evidence of accessory left renal artery or additional source of hemorrhage. Small caliber and likely significantly stenosed right renal artery. No evidence of abdominal aortic aneurysm. The bilateral visualized iliac arteries are widely patent.  IMPRESSION: 1. Highly abnormal divisional and interlobular left renal arteries with multiple  areas of focal pseudoaneurysm formation and slow extravasation contrast material. 2. Successful coil embolization of the superior and inferior left divisional renal arteries and coil embolization of the distal aspect of the left main renal artery.  Signed,  Criselda Peaches, MD, [place]  [facility]  [facility]   Electronically Signed   By: Jacqulynn Cadet M.D.   On: 01/28/2020 10:06   IR EMBO ART  VEN HEMORR LYMPH EXTRAV  INC GUIDE ROADMAPPING  Result Date: 01/28/2020 INDICATION: 59 year old female with end-stage renal disease on hemodialysis. She is anuric. 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 Unfortunately, she has now developed severe spontaneous hemorrhage from the left kidney which appears to be slowly expanding and requiring transfusion. Therefore, she presents for left renal angiogram and planned embolization. EXAM: SELECTIVE VISCERAL ARTERIOGRAPHY; IR EMBO ART VEN HEMORR LYMPH EXTRAV INC GUIDE ROADMAPPING 1. Left renal angiogram 2. Selective catheterization and angiography of inferior division ule left renal artery 3. Coil embolization of left renal arteries 4. Aortic angiogram MEDICATIONS: None. ANESTHESIA/SEDATION: Moderate (conscious) sedation was employed during this procedure. A total of Versed 1 mg and Fentanyl 25 mcg was administered intravenously. Moderate Sedation Time: 40 minutes. The patient's level of consciousness and vital signs were monitored continuously by radiology nursing throughout the procedure under my direct supervision. CONTRAST:  96mL OMNIPAQUE IOHEXOL 300 MG/ML  SOLN FLUOROSCOPY TIME:  Fluoroscopy Time: 9 minutes 48 seconds (185.3 mGy). COMPLICATIONS: None immediate. PROCEDURE: Informed consent was obtained from the patient following explanation of the procedure, risks, benefits and alternatives. The patient understands,  agrees and consents for the procedure. All questions were addressed. A time out was performed prior to the initiation of the procedure. Maximal barrier sterile technique utilized including caps, mask, sterile gowns, sterile gloves, large sterile drape, hand hygiene, and Betadine prep. The right common femoral artery was interrogated with ultrasound and found to be widely patent. An image was obtained and stored for the medical record. Local anesthesia was attained by infiltration with 1% lidocaine. A small dermatotomy was made. Under real-time sonographic guidance, the vessel was punctured with a 21 gauge micropuncture needle. Using standard technique, the initial micro needle was exchanged over a 0.018 micro wire for a transitional 4 [place] micro sheath. The micro sheath was then exchanged over a 0.035 wire for a 5 French vascular sheath. A C2 cobra catheter was advanced over a Bentson wire into the abdominal aorta and used to select  the left renal artery. A renal arteriogram was performed. The renal vessels are highly abnormal. The anterior and posterior divisions are stenotic and beaded. Interlobular arteries in the lower pole and interpolar region demonstrate small focal pseudoaneurysms with slow extravasation of contrast material consistent with the patient's clinical history of progressive left renal hemorrhage. A renegade ST microcatheter was next advanced over a Fathom 16 wire into the inferior division ule artery. Contrast injection was performed confirming the presence of a pseudoaneurysm and slow extravasation of contrast material. Coil embolization was then performed using 2 separate 2 x 4 mm penumbra LP coils. The coil pack successfully filled the targeted branch and we were also able to prolapse of few of the coils into the superior division ule branch as well. The distal main renal artery was then further coil embolized using fibered detachable interlock coils. A total of 4 coils were placed.  Follow-up angiography demonstrates successful occlusion of the left renal artery and its distal branches. No further opacification of the pseudo aneurysms and no evidence of continued bleeding. To ensure that there are no accessory renal arteries that also need to be treated, the C2 cobra catheter was then exchanged over a Bentson wire and an Omni flush catheter was advanced into the abdominal aorta. Abdominal aortography was next performed. No evidence of accessory left renal artery or additional source of hemorrhage. Small caliber and likely significantly stenosed right renal artery. No evidence of abdominal aortic aneurysm. The bilateral visualized iliac arteries are widely patent. IMPRESSION: 1. Highly abnormal divisional and interlobular left renal arteries with multiple areas of focal pseudoaneurysm formation and slow extravasation contrast material. 2. Successful coil embolization of the superior and inferior left divisional renal arteries and coil embolization of the distal aspect of the left main renal artery. Signed, Criselda Peaches, MD, [place] [facility]iology Electronically Signed   By: Jacqulynn Cadet M.D.   On: 01/28/2020 10:06   Medications: . sodium chloride    . heparin 950 Units/hr (01/28/20 2130)   . sodium chloride   Intravenous Once  . sodium chloride   Intravenous Once  . calcitRIOL  2 mcg Oral Q M,W,F  . cinacalcet  180 mg Oral Q M,W,F-HD  . docusate sodium  100 mg Oral Daily  . ferric citrate  420 mg Oral TID WC  . metoprolol tartrate  25 mg Oral BID  . multivitamin  1 tablet Oral QHS  . pantoprazole  40 mg Oral QAC breakfast  . polyethylene glycol  17 g Oral BID  . senna  1 tablet Oral Daily  . simethicone  80 mg Oral TID  . Warfarin - Pharmacist Dosing Inpatient   Does not apply [id number]

## 2020-01-30 LAB — CBC
HCT: 23.8 % — ABNORMAL LOW (ref 36.0–46.0)
Hemoglobin: 7.6 g/dL — ABNORMAL LOW (ref 12.0–15.0)
MCH: 30.9 pg (ref 26.0–34.0)
MCHC: 31.9 g/dL (ref 30.0–36.0)
MCV: 96.7 fL (ref 80.0–100.0)
Platelets: 247 10*3/uL (ref 150–400)
RBC: 2.46 MIL/uL — ABNORMAL LOW (ref 3.87–5.11)
RDW: 17.4 % — ABNORMAL HIGH (ref 11.5–15.5)
WBC: 9.2 10*3/uL (ref 4.0–10.5)
nRBC: 0 % (ref 0.0–0.2)

## 2020-01-30 LAB — RENAL FUNCTION PANEL
Albumin: 2.4 g/dL — ABNORMAL LOW (ref 3.5–5.0)
Anion gap: 15 (ref 5–15)
BUN: 45 mg/dL — ABNORMAL HIGH (ref 6–20)
CO2: 25 mmol/L (ref 22–32)
Calcium: 8.8 mg/dL — ABNORMAL LOW (ref 8.9–10.3)
Chloride: 89 mmol/L — ABNORMAL LOW (ref 98–111)
Creatinine, Ser: 8.66 mg/dL — ABNORMAL HIGH (ref 0.44–1.00)
GFR calc Af Amer: 5 mL/min — ABNORMAL LOW (ref >60)
GFR calc non Af Amer: 5 mL/min — ABNORMAL LOW (ref >60)
Glucose, Bld: 96 mg/dL (ref 70–99)
Phosphorus: 3.9 mg/dL (ref 2.5–4.6)
Potassium: 3.9 mmol/L (ref 3.5–5.1)
Sodium: 129 mmol/L — ABNORMAL LOW (ref 135–145)

## 2020-01-30 LAB — HEMOGLOBIN AND HEMATOCRIT, BLOOD
HCT: 26.5 % — ABNORMAL LOW (ref 36.0–46.0)
Hemoglobin: 8.5 g/dL — ABNORMAL LOW (ref 12.0–15.0)

## 2020-01-30 LAB — HEPARIN LEVEL (UNFRACTIONATED): Heparin Unfractionated: 0.3 IU/mL (ref 0.30–0.70)

## 2020-01-30 LAB — PROTIME-INR
INR: 1.1 (ref 0.8–1.2)
Prothrombin Time: 13.7 seconds (ref 11.4–15.2)

## 2020-01-30 MED ORDER — WARFARIN SODIUM 3 MG PO TABS
6.0000 mg | ORAL_TABLET | Freq: Once | ORAL | Status: AC
  Administered 2020-01-30: 6 mg via ORAL
  Filled 2020-01-30: qty 2

## 2020-01-30 NOTE — Progress Notes (Signed)
Subjective: abd pain better again.  Hb 7.9 yest > 7.7 overnight and 7.6 early this am.  No return of abd pain or nausea.   3 L off w/ HD yesterday. Coumadin restarted last night.   Objective Vital signs in last 24 hours: Vitals:   01/30/20 0700 01/30/20 0709 01/30/20 0800 01/30/20 1113  BP: 111/61 134/77  103/70  Pulse: 92 97  90  Resp: 11 19  16   Temp:  98 F (36.7 C)  99 F (37.2 C)  TempSrc:  Oral  Oral  SpO2: 98% 99% 100% 100%  Weight:  61.5 kg    Height:       Weight change: 1.134 kg  Physical Exam: General: alert, not in distress Heart: RRR, 2/6 SEM, mechanical heart sound Lungs: CTA bilaterally nonlabored breathing Abdomen: Bowel sounds positive, nondistended, tender left flank Extremities: No lower extremity edema Dialysis Access: RUA aVF+ bruit   OP HD: MWF    4h  61kg  450/800   2/2 bath  P2  Hep 1800  -sensipar 180mg   -calcitriol 40mcg  -no esa, no iron   Problem/Plan: 1.L retroperitoneal perirenal bleed / hematoma - Bleed seen on CT 8/18. S/p 2 units FFP, 4 units pRBC total and Vit K. IV heparin started 8/21 and stopped 8/22, given more pRBC. IV hep and coumadin restarted 8/23.Seen by Urology. Rpt CT Abd 9/02 w/ IV contrast identified an organizing hematoma, no renal mass could be seen. Pt was stable until INR up to 2.9 then L abd pain recurred and Hb began dropping again so pt rec'd IV vit K 5mg  and the next day was taken for L kidney embolization procedure done by IR on 1/28 w/o complications.  - since embolization pain and nausea have improved and resolved - following Hb 8.2 > 7.9 > 7.7 > 7.6 this am, suspect she is leveling off - coumadin restarted yest, continues on IV hep - f/u Hb planned for this afternoon and in the am tomorrow  2. Rectal bleeding- 1 episode, resolved 3.Hx AVR/MVR- as above  4. ESRD- on HD MWF. HD Monday.  5. Anemiaof CKD- As above S/p 6 units pRBC. TSAT 84% 8/30.  6. Secondary hyperparathyroidism- Phos at goal. 3.4  cont low Ca bath. . Continue VDRA and sensipar. Noted on Auryxia 840 mg with phosphorus controlled in hospital and with high TSAT level but transfused will decrease to 420 G follow up trend no changes for now 7.HTN/volume- BP stable, UF to 61kg  8. Nutrition- Renal diet w/fluid restrictions. Alb 2.8. Protein supplements.    [name], MD 01/30/2020, 12:08 PM      Labs: Basic Metabolic Panel: Recent Labs  Lab 01/28/20 0407 01/29/20 1333 01/30/20 0144  NA 133* 129*  129* 129*  K 3.6 3.7  3.8 3.9  CL 93* 91*  91* 89*  CO2 28 25  25 25   GLUCOSE 119* 116*  117* 96  BUN 16 33*  33* 45*  CREATININE 4.83* 7.50*  7.42* 8.66*  CALCIUM 8.2* 8.4*  8.4* 8.8*  PHOS 4.4 4.1  4.1 3.9   Liver Function Tests: Recent Labs  Lab 01/28/20 0407 01/29/20 1333 01/30/20 0144  AST  --  22  --   ALT  --  16  --   ALKPHOS  --  97  --   BILITOT  --  0.8  --   PROT  --  6.9  --   ALBUMIN 2.4* 2.5*  2.5* 2.4*   No results for  input(s): LIPASE, AMYLASE in the last 168 hours. No results for input(s): AMMONIA in the last 168 hours. CBC: Recent Labs  Lab 01/27/20 0455 01/27/20 1853 01/28/20 0407 01/28/20 1312 01/29/20 1333 01/29/20 2131 01/30/20 0144  WBC 17.5*  --  12.6*  --  9.3 9.4 9.2  HGB 6.6*   < > 6.8*   < > 7.9* 7.7* 7.6*  HCT 20.6*   < > 21.1*   < > 24.4* 24.0* 23.8*  MCV 101.5*  --  95.9  --  96.1 96.8 96.7  PLT 228  --  214  --  242 234 247   < > = values in this interval not displayed.   Cardiac Enzymes: No results for input(s): CKTOTAL, CKMB, CKMBINDEX, TROPONINI in the last 168 hours. CBG: No results for input(s): GLUCAP in the last 168 hours.  Studies/Results: No results found. Medications: . sodium chloride    . heparin 1,100 Units/hr (01/30/20 0819)   . sodium chloride   Intravenous Once  . sodium chloride   Intravenous Once  . calcitRIOL  2 mcg Oral Q M,W,F  . cinacalcet  180 mg Oral Q M,W,F-HD  . docusate sodium  100 mg Oral Daily  . ferric  citrate  420 mg Oral TID WC  . metoprolol tartrate  25 mg Oral BID  . multivitamin  1 tablet Oral QHS  . pantoprazole  40 mg Oral QAC breakfast  . polyethylene glycol  17 g Oral BID  . senna  1 tablet Oral Daily  . simethicone  80 mg Oral TID  . warfarin  6 mg Oral [id number]-1600  . Warfarin - Pharmacist Dosing Inpatient   Does not apply [id number]

## 2020-01-30 NOTE — Progress Notes (Addendum)
PROGRESS NOTE    Jazmaine Fuelling  HDQ:[id number] DOB: Aug 05, 1960 DOA: 01/06/2020 PCP: Benito Mccreedy, MD   Brief Narrative: Audreena Sachdeva Milesis an 59 y.o.female withpast medical history of end-stage renal disease on hemodialysis Monday Wednesday and Friday status post aortic and mitral valve replacement on Coumadin who presents with nausea and nonbloody emesis, She was found to have a spontaneous left retroperitoneal bleed. She status post 6 units of packed red blood cells,  2 units of fresh frozen plasma and vitamin K.  Her hemoglobin continues to drop. She underwent  IR guided angiogram and embolization of the bleeding artery 01/27/20. She is on heparin gtt , Her Hb slightly dropped overnight to 7.6 , back to Hb 8.5. remained stable.  Coumadin resumed last night , remains on heparin until INR therapeutic.   Assessment & Plan:   Principal Problem:   Retroperitoneal hemorrhage Active Problems:   End-stage renal disease on hemodialysis (HCC)   H/O heart valve replacement with mechanical valve   S/P mitral valve replacement with metallic valve   Nontraumatic retroperitoneal hematoma   Status post mechanical aortic valve replacement     Retroperitoneal hemorrhage- mechanical aortic and mitral valve - this appears to have been a spontaneous hemorrhage. - s/p 6 U PRBC, Vit K and 2 U FFP.  -  It has resolved and Coumadin was resumed with Heparin bridge on 8/23 - ultrasound of abdomen reveals stable retroperitoneal bleed. - 9/2> CT abd/pelvis shows a stable hematoma. - patient had recurrent severe left flank pain which she states feels similar to the pain she had when she was found to have the RP hemorrhage -  Dilaudid IV for her pain was ordered and increased her Oxycodone - Hb dropped to 8  9/3  - reordered a CT scan and her hematoma does appear larger than before thus, consulted IR for an embolization - per IR, it is difficult to tell if she is still bleeding , Dr Earleen Newport requests  that we decrease her INR to below 2.0 for the angiogram  - She was given Vitamin K 5 mg  - cont to follow Hemoglobin closely and transfuse if < 7.  - She underwent angiogram followed by embolization of bleeding artery by IR  01/27/20, tolerated well -  Her hb dropped to 6.8 despite embolization. She received 2 more units PRBC post Hb 8.5 -  Continue heparin gtt, Hb remained stable afterwards, flank pain better,   - coumadin resumed yesterday , continue heparin until INR therapeutic. -   continue to moniter H and H  H/o : Aortic and mitral valve replacement: She was on Coumadin,  Coumadin was on hold because of retroperitoneal bleed. Now bleeding has stopped,  hemoglobin remained stable,  remains on heparin gtt., Coumadin resumed from yesterday until INR therapeutic.  Nausea and Vomiting - Improved - patient claims the pain is causing this but it may also be the Dilauidid- continue PRN Zofran - Nausea and vomiting resolved.    Gross hematuria>>> Improving  - obtained CT abd./pelvis to see if a source of bleeding can be found but this was unremarkable. - current amount of blood is small. -  Urology consulted if she continues to have this issue when close to d/c,  - She will make follow up appointment for urology to investigate this further after discharge    End-stage renal disease on hemodialysis  Secondary hyperparathyroidism - cont HD on MWF   DVT prophylaxis: Heparin gtt / coumadin resumed Code Status: Full  Family Communication: No family at bed side. Disposition Plan:  Dispo: The patient is from: Home  Anticipated d/c is to: Home  Anticipated d/c date is: 3 days  Patient currently is not medically stable to d/c. Ongoing retroperitoneal bleeding/subtherapeutic INR.   Consultants:    Nephrology, Cardiology, Urology, IR  Procedures:   Antimicrobials: Anti-infectives (From admission, onward)   None     Subjective: Patient was seen  and examined in the morning..  Overnight events noted.   She reports feeling much better, reports left flank pain is completely resolved. She denies any nausea, vomiting and diarrhea.  Her hemoglobin remains stable,  denies any bleeding in the urine. Objective: Vitals:   01/30/20 0700 01/30/20 0709 01/30/20 0800 01/30/20 1113  BP: 111/61 134/77  103/70  Pulse: 92 97  90  Resp: 11 19  16   Temp:  98 F (36.7 C)  99 F (37.2 C)  TempSrc:  Oral  Oral  SpO2: 98% 99% 100% 100%  Weight:  61.5 kg    Height:        Intake/Output Summary (Last 24 hours) at 01/30/2020 1343 Last data filed at 01/30/2020 1231 Gross per 24 hour  Intake 1515.08 ml  Output 3000 ml  Net -1484.92 ml   Filed Weights   01/30/20 0202 01/30/20 0330 01/30/20 0709  Weight: 64.8 kg 65.1 kg 61.5 kg    Examination:  General exam: Appears calm and comfortable. Alert, oriented.  Respiratory system: Clear to auscultation. Respiratory effort normal. Cardiovascular system: S1 & S2 heard, RRR. No JVD, murmurs, rubs, gallops or clicks. No pedal edema. Gastrointestinal system: Abdomen is nondistended, soft and mildly tender in left flank.  No organomegaly or masses felt. Normal bowel sounds heard. Central nervous system: Alert and oriented. No focal neurological deficits. Extremities:  No leg edema, no cyanosis, no clubbing. Skin: No rashes, lesions or ulcers Psychiatry: Judgement and insight appear normal. Mood & affect appropriate.     Data Reviewed: I have personally reviewed following labs and imaging studies  CBC: Recent Labs  Lab 01/27/20 0455 01/27/20 1853 01/28/20 0407 01/28/20 1312 01/28/20 1908 01/29/20 1333 01/29/20 2131 01/30/20 0144 01/30/20 1224  WBC 17.5*  --  12.6*  --   --  9.3 9.4 9.2  --   HGB 6.6*   < > 6.8*   < > 7.9* 7.9* 7.7* 7.6* 8.5*  HCT 20.6*   < > 21.1*   < > 24.5* 24.4* 24.0* 23.8* 26.5*  MCV 101.5*  --  95.9  --   --  96.1 96.8 96.7  --   PLT 228  --  214  --   --  242 234 247   --    < > = values in this interval not displayed.   Basic Metabolic Panel: Recent Labs  Lab 01/26/20 0511 01/27/20 0337 01/28/20 0407 01/29/20 1333 01/30/20 0144  NA 135 129* 133* 129*  129* 129*  K 3.7 4.2 3.6 3.7  3.8 3.9  CL 95* 90* 93* 91*  91* 89*  CO2 28 27 28 25  25 25   GLUCOSE 99 108* 119* 116*  117* 96  BUN 16 28* 16 33*  33* 45*  CREATININE 4.92* 7.04* 4.83* 7.50*  7.42* 8.66*  CALCIUM 9.2 9.5 8.2* 8.4*  8.4* 8.8*  MG  --   --  1.8 1.8  --   PHOS 3.8 5.7* 4.4 4.1  4.1 3.9   GFR: Estimated Creatinine Clearance: 6.7 mL/min (A) (by C-G formula based on  SCr of 8.66 mg/dL (H)). Liver Function Tests: Recent Labs  Lab 01/26/20 0511 01/27/20 0337 01/28/20 0407 01/29/20 1333 01/30/20 0144  AST  --   --   --  22  --   ALT  --   --   --  16  --   ALKPHOS  --   --   --  97  --   BILITOT  --   --   --  0.8  --   PROT  --   --   --  6.9  --   ALBUMIN 2.7* 2.5* 2.4* 2.5*  2.5* 2.4*   No results for input(s): LIPASE, AMYLASE in the last 168 hours. No results for input(s): AMMONIA in the last 168 hours. Coagulation Profile: Recent Labs  Lab 01/26/20 0511 01/27/20 0337 01/28/20 0407 01/29/20 1333 01/30/20 0144  INR 2.9* 1.4* 1.2 1.1 1.1   Cardiac Enzymes: No results for input(s): CKTOTAL, CKMB, CKMBINDEX, TROPONINI in the last 168 hours. BNP (last 3 results) No results for input(s): PROBNP in the last 8760 hours. HbA1C: No results for input(s): HGBA1C in the last 72 hours. CBG: No results for input(s): GLUCAP in the last 168 hours. Lipid Profile: No results for input(s): CHOL, HDL, LDLCALC, TRIG, CHOLHDL, LDLDIRECT in the last 72 hours. Thyroid Function Tests: No results for input(s): TSH, T4TOTAL, FREET4, T3FREE, THYROIDAB in the last 72 hours. Anemia Panel: No results for input(s): VITAMINB12, FOLATE, FERRITIN, TIBC, IRON, RETICCTPCT in the last 72 hours. Sepsis Labs: No results for input(s): PROCALCITON, LATICACIDVEN in the last 168 hours.  No  results found for this or any previous visit (from the past 240 hour(s)).   Radiology Studies: No results found.  Scheduled Meds: . sodium chloride   Intravenous Once  . sodium chloride   Intravenous Once  . calcitRIOL  2 mcg Oral Q M,W,F  . cinacalcet  180 mg Oral Q M,W,F-HD  . docusate sodium  100 mg Oral Daily  . ferric citrate  420 mg Oral TID WC  . metoprolol tartrate  25 mg Oral BID  . multivitamin  1 tablet Oral QHS  . pantoprazole  40 mg Oral QAC breakfast  . polyethylene glycol  17 g Oral BID  . senna  1 tablet Oral Daily  . simethicone  80 mg Oral TID  . warfarin  6 mg Oral ONCE-1600  . Warfarin - Pharmacist Dosing Inpatient   Does not apply q1600   Continuous Infusions: . sodium chloride    . heparin 1,100 Units/hr (01/30/20 0819)     LOS: 24 days    Time spent: 25 mins.    Shawna Clamp, MD [facility]   If 7PM-7AM, please contact night-coverage

## 2020-01-30 NOTE — Plan of Care (Signed)
  Problem: Clinical Measurements: Goal: Will remain free from infection Outcome: Progressing   Problem: Clinical Measurements: Goal: Ability to maintain clinical measurements within normal limits will improve Outcome: Progressing   Problem: Health Behavior/Discharge Planning: Goal: Ability to manage health-related needs will improve Outcome: Progressing

## 2020-01-30 NOTE — Plan of Care (Signed)
  Problem: Health Behavior/Discharge Planning: Goal: Ability to manage health-related needs will improve Outcome: Progressing   Problem: Clinical Measurements: Goal: Ability to maintain clinical measurements within normal limits will improve Outcome: Progressing Goal: Will remain free from infection Outcome: Progressing Goal: Diagnostic test results will improve Outcome: Progressing Goal: Respiratory complications will improve Outcome: Progressing Goal: Cardiovascular complication will be avoided Outcome: Progressing   Problem: Activity: Goal: Risk for activity intolerance will decrease Outcome: Progressing   Problem: Nutrition: Goal: Adequate nutrition will be maintained Outcome: Progressing   Problem: Coping: Goal: Level of anxiety will decrease Outcome: Progressing   Problem: Elimination: Goal: Will not experience complications related to bowel motility Outcome: Progressing Goal: Will not experience complications related to urinary retention Outcome: Progressing   Problem: Pain Managment: Goal: General experience of comfort will improve Outcome: Progressing   Problem: Safety: Goal: Ability to remain free from injury will improve Outcome: Progressing   Problem: Skin Integrity: Goal: Risk for impaired skin integrity will decrease Outcome: Progressing   Problem: Education: Goal: Ability to demonstrate management of disease process will improve Outcome: Progressing Goal: Ability to verbalize understanding of medication therapies will improve Outcome: Progressing Goal: Individualized Educational Video(s) Outcome: Progressing   Problem: Activity: Goal: Capacity to carry out activities will improve Outcome: Progressing   Problem: Cardiac: Goal: Ability to achieve and maintain adequate cardiopulmonary perfusion will improve Outcome: Progressing   

## 2020-01-30 NOTE — Progress Notes (Signed)
[place] for heparin, warfarin Indication: mechanical AVR/MVR  No Known Allergies  Patient Measurements: Height: 5\' 6"  (167.6 cm) Weight: 63.7 kg (140 lb 6.4 oz) (scale a) IBW/kg (Calculated) : 59.3 Heparin Dosing Weight: 62kg  Vital Signs: Temp: 98.6 F (37 C) (09/11 0202) Temp Source: Oral (09/11 0202) BP: 119/71 (09/11 0202) Pulse Rate: 95 (09/11 0202)  Labs: Recent Labs    01/28/20 0407 01/28/20 1312 01/29/20 1333 01/29/20 1333 01/29/20 1750 01/29/20 2131 01/30/20 0144  HGB 6.8*   < > 7.9*   < >  --  7.7* 7.6*  HCT 21.1*   < > 24.4*  --   --  24.0* 23.8*  PLT 214  --  242  --   --  234 247  LABPROT 14.4  --  13.4  --   --   --  13.7  INR 1.2  --  1.1  --   --   --  1.1  HEPARINUNFRC 0.23*  --  0.27*  --  0.24*  --  0.30  CREATININE 4.83*  --  7.50*  7.42*  --   --   --  8.66*   < > = values in this interval not displayed.    Estimated Creatinine Clearance: 6.7 mL/min (A) (by C-G formula based on SCr of 8.66 mg/dL (H)).   Assessment: 59 yo W on warfarin PTA for hx mechanical AVR/MVR (goal 2.5-3.5), reversed with vit K 10 mg IV + 2 units FFP  + 4 units PRBcs on 8/18 and another vit K po 5mg  on 9/7 with 2 unit RBCs 9/8-9/9 due to continued retroperitoneal bleed. .  -heparin level at the low end of goal -INR= 1.1, hg= 7.6  PTA Warfarin regimen: 9 mg daily except 6 mg on Mondays - last 8/18 (INR was 3.6 on admit 8/18)    Goal of Therapy:  Heparin level 0.3 -0.5 units/ml (low goal due to bleed) INR 2.5- 3.5  Monitor platelets by anticoagulation protocol: Yes   Plan:  Continue heparin at 1000 units/hr  Monitor daily INR, HL, CBC/plt Warfarin 6mg  po today Stop heparin when INR is greater than 2.5  Monitor for signs/symptoms of bleeding   Hildred Laser, PharmD Clinical Pharmacist **Pharmacist phone directory can now be found on [email or web address] ([email or web address]).  Listed under [place].

## 2020-01-30 NOTE — Progress Notes (Signed)
Pt still in dialysis suite not back yet report given to [facility].

## 2020-01-31 LAB — RENAL FUNCTION PANEL
Albumin: 2.4 g/dL — ABNORMAL LOW (ref 3.5–5.0)
Anion gap: 11 (ref 5–15)
BUN: 26 mg/dL — ABNORMAL HIGH (ref 6–20)
CO2: 27 mmol/L (ref 22–32)
Calcium: 9.3 mg/dL (ref 8.9–10.3)
Chloride: 94 mmol/L — ABNORMAL LOW (ref 98–111)
Creatinine, Ser: 6.12 mg/dL — ABNORMAL HIGH (ref 0.44–1.00)
GFR calc Af Amer: 8 mL/min — ABNORMAL LOW (ref >60)
GFR calc non Af Amer: 7 mL/min — ABNORMAL LOW (ref >60)
Glucose, Bld: 99 mg/dL (ref 70–99)
Phosphorus: 3.5 mg/dL (ref 2.5–4.6)
Potassium: 3.6 mmol/L (ref 3.5–5.1)
Sodium: 132 mmol/L — ABNORMAL LOW (ref 135–145)

## 2020-01-31 LAB — CBC
HCT: 24.7 % — ABNORMAL LOW (ref 36.0–46.0)
Hemoglobin: 7.8 g/dL — ABNORMAL LOW (ref 12.0–15.0)
MCH: 31.6 pg (ref 26.0–34.0)
MCHC: 31.6 g/dL (ref 30.0–36.0)
MCV: 100 fL (ref 80.0–100.0)
Platelets: 263 10*3/uL (ref 150–400)
RBC: 2.47 MIL/uL — ABNORMAL LOW (ref 3.87–5.11)
RDW: 16.8 % — ABNORMAL HIGH (ref 11.5–15.5)
WBC: 7.8 10*3/uL (ref 4.0–10.5)
nRBC: 0.3 % — ABNORMAL HIGH (ref 0.0–0.2)

## 2020-01-31 LAB — PROTIME-INR
INR: 1.1 (ref 0.8–1.2)
Prothrombin Time: 13.9 seconds (ref 11.4–15.2)

## 2020-01-31 LAB — HEMOGLOBIN AND HEMATOCRIT, BLOOD
HCT: 26 % — ABNORMAL LOW (ref 36.0–46.0)
Hemoglobin: 8.2 g/dL — ABNORMAL LOW (ref 12.0–15.0)

## 2020-01-31 LAB — MAGNESIUM: Magnesium: 1.9 mg/dL (ref 1.7–2.4)

## 2020-01-31 LAB — HEPARIN LEVEL (UNFRACTIONATED): Heparin Unfractionated: 0.33 IU/mL (ref 0.30–0.70)

## 2020-01-31 MED ORDER — WARFARIN SODIUM 7.5 MG PO TABS
7.5000 mg | ORAL_TABLET | Freq: Once | ORAL | Status: AC
  Administered 2020-01-31: 7.5 mg via ORAL
  Filled 2020-01-31: qty 1

## 2020-01-31 NOTE — Progress Notes (Signed)
Subjective:  No new/ c/o's, Hb stable 7.8 today  Objective Vital signs in last 24 hours: Vitals:   01/30/20 2041 01/31/20 0100 01/31/20 0511 01/31/20 1131  BP: 129/83  132/81 131/80  Pulse: 99  89 92  Resp: 16  15 18   Temp: 98.9 F (37.2 C)  98.5 F (36.9 C) 98.5 F (36.9 C)  TempSrc: Oral  Oral Oral  SpO2: 95%  100% 98%  Weight:  62.8 kg    Height:       Weight change: -3.319 kg  Physical Exam: General: alert, not in distress Heart: RRR, 2/6 SEM, mechanical heart sound Lungs: CTA bilaterally nonlabored breathing Abdomen: Bowel sounds positive, nondistended, tender left flank Extremities: No lower extremity edema Dialysis Access: RUA aVF+ bruit   OP HD: MWF    4h  61kg  450/800   2/2 bath  P2  Hep 1800  -sensipar 180mg   -calcitriol 2mcg  -no esa, no iron   Problem/Plan: 1.L retroperitoneal perirenal bleed / hematoma - Bleed seen on CT 8/18. S/p 2 units FFP, 4 units pRBC total and Vit K. IV heparin started 8/21 and stopped 8/22, given more pRBC. IV hep and coumadin restarted 8/23.Seen by Urology. Rpt CT Abd 9/02 w/ IV contrast identified an organizing hematoma, no renal mass could be seen. Pt was stable until INR up to 2.9 then L abd pain recurred and Hb began dropping again so pt rec'd IV vit K 5mg  and the next day was taken for L kidney embolization procedure done by IR on 9/14 w/o complications.  - since embolization pain and nausea have improved/ resolved - Hb leveling off in the mid to high 7's - coumadin restarted 9/10, continues on IV heparin  2. Rectal bleeding- 1 episode, resolved 3.Hx AVR/MVR- as above  4. ESRD- on HD MWF. HD Monday.  5. Anemiaof CKD- As above S/p 6 units pRBC. TSAT 84% 8/30.  6. Secondary hyperparathyroidism- Phos at goal. 3.4 cont low Ca bath. . Continue VDRA and sensipar. Noted on Auryxia 840 mg with phosphorus controlled in hospital and with high TSAT level but transfused will decrease to 420 G follow up trend no changes for  now 7.HTN/volume- BP stable, UF to 61kg  8. Nutrition- Renal diet w/fluid restrictions. Alb 2.8. Protein supplements.    [name], MD 01/31/2020, 4:19 PM      Labs: Basic Metabolic Panel: Recent Labs  Lab 01/29/20 1333 01/30/20 0144 01/31/20 0607  NA 129*  129* 129* 132*  K 3.7  3.8 3.9 3.6  CL 91*  91* 89* 94*  CO2 25  25 25 27   GLUCOSE 116*  117* 96 99  BUN 33*  33* 45* 26*  CREATININE 7.50*  7.42* 8.66* 6.12*  CALCIUM 8.4*  8.4* 8.8* 9.3  PHOS 4.1  4.1 3.9 3.5   Liver Function Tests: Recent Labs  Lab 01/29/20 1333 01/30/20 0144 01/31/20 0607  AST 22  --   --   ALT 16  --   --   ALKPHOS 97  --   --   BILITOT 0.8  --   --   PROT 6.9  --   --   ALBUMIN 2.5*  2.5* 2.4* 2.4*   No results for input(s): LIPASE, AMYLASE in the last 168 hours. No results for input(s): AMMONIA in the last 168 hours. CBC: Recent Labs  Lab 01/28/20 0407 01/28/20 1312 01/29/20 1333 01/29/20 1333 01/29/20 2131 01/29/20 2131 01/30/20 0144 01/30/20 1224 01/31/20 0607  WBC 12.6*  --  9.3   < > 9.4  --  9.2  --  7.8  HGB 6.8*   < > 7.9*   < > 7.7*   < > 7.6* 8.5* 7.8*  HCT 21.1*   < > 24.4*   < > 24.0*   < > 23.8* 26.5* 24.7*  MCV 95.9  --  96.1  --  96.8  --  96.7  --  100.0  PLT 214  --  242   < > 234  --  247  --  263   < > = values in this interval not displayed.   Cardiac Enzymes: No results for input(s): CKTOTAL, CKMB, CKMBINDEX, TROPONINI in the last 168 hours. CBG: No results for input(s): GLUCAP in the last 168 hours.  Studies/Results: No results found. Medications: . sodium chloride    . heparin 1,100 Units/hr (01/30/20 2248)   . sodium chloride   Intravenous Once  . sodium chloride   Intravenous Once  . calcitRIOL  2 mcg Oral Q M,W,F  . cinacalcet  180 mg Oral Q M,W,F-HD  . docusate sodium  100 mg Oral Daily  . ferric citrate  420 mg Oral TID WC  . metoprolol tartrate  25 mg Oral BID  . multivitamin  1 tablet Oral QHS  . pantoprazole  40 mg  Oral QAC breakfast  . polyethylene glycol  17 g Oral BID  . senna  1 tablet Oral Daily  . simethicone  80 mg Oral TID  . warfarin  7.5 mg Oral ONCE-1600  . Warfarin - Pharmacist Dosing Inpatient   Does not apply [id number]

## 2020-01-31 NOTE — Plan of Care (Signed)
  Problem: Nutrition: Goal: Adequate nutrition will be maintained Outcome: Completed/Met   Problem: Safety: Goal: Ability to remain free from injury will improve Outcome: Completed/Met   Problem: Skin Integrity: Goal: Risk for impaired skin integrity will decrease Outcome: Completed/Met

## 2020-01-31 NOTE — Progress Notes (Signed)
[place] for heparin, warfarin Indication: mechanical AVR/MVR  No Known Allergies  Patient Measurements: Height: 5\' 6"  (167.6 cm) Weight: 62.8 kg (138 lb 7.2 oz) IBW/kg (Calculated) : 59.3 Heparin Dosing Weight: 62kg  Vital Signs: Temp: 98.5 F (36.9 C) (09/12 0511) Temp Source: Oral (09/12 0511) BP: 132/81 (09/12 0511) Pulse Rate: 89 (09/12 0511)  Labs: Recent Labs    01/29/20 1333 01/29/20 1333 01/29/20 1750 01/29/20 2131 01/29/20 2131 01/30/20 0144 01/30/20 0144 01/30/20 1224 01/31/20 0607  HGB 7.9*   < >  --  7.7*   < > 7.6*   < > 8.5* 7.8*  HCT 24.4*   < >  --  24.0*   < > 23.8*  --  26.5* 24.7*  PLT 242   < >  --  234  --  247  --   --  263  LABPROT 13.4  --   --   --   --  13.7  --   --  13.9  INR 1.1  --   --   --   --  1.1  --   --  1.1  HEPARINUNFRC 0.27*   < > 0.24*  --   --  0.30  --   --  0.33  CREATININE 7.50*  7.42*  --   --   --   --  8.66*  --   --  6.12*   < > = values in this interval not displayed.    Estimated Creatinine Clearance: 9.5 mL/min (A) (by C-G formula based on SCr of 6.12 mg/dL (H)).   Assessment: 59 yo W on warfarin PTA for hx mechanical AVR/MVR (goal 2.5-3.5), reversed with vit K 10 mg IV + 2 units FFP  + 4 units PRBcs on 8/18 and another vit K po 5mg  on 9/7 with 2 unit RBCs 9/8-9/9 due to continued retroperitoneal bleed.  PTA Warfarin regimen: 9 mg daily except 6 mg on Mondays - last 8/18 (INR was 3.6 on admit 8/18)    Heparin level is therapeutic at 0.33. INR remains below goal at 1.1. Hgb down to 7.8, PLT WNL. Variable PO intake charted. Since INR has not moved, will increase dose of warfarin tonight.  Goal of Therapy:  Heparin level 0.3 -0.5 units/ml (low goal due to bleed) INR 2.5- 3.5  Monitor platelets by anticoagulation protocol: Yes   Plan:  Continue heparin at 1000 units/hr  Monitor daily INR, HL, CBC/plt Warfarin 7.5 mg po today Stop heparin when INR is greater than 2.5   Monitor for signs/symptoms of bleeding   Mercy Riding, PharmD PGY1 Acute Care Pharmacy Resident Please refer to [facility] for unit-specific pharmacist

## 2020-01-31 NOTE — Progress Notes (Signed)
PROGRESS NOTE    [name]  ZWC:[id number] DOB: May 30, 1960 DOA: 01/06/2020 PCP: Benito Mccreedy, MD   Brief Narrative: Connie Crossett Milesis an 59 y.o.female withpast medical history of end-stage renal disease on hemodialysis Monday Wednesday and Friday status post aortic and mitral valve replacement on Coumadin who presents with nausea and nonbloody emesis, She was found to have a spontaneous left retroperitoneal bleed. She status post 6 units of packed red blood cells,  2 units of fresh frozen plasma and vitamin K.  Her hemoglobin continued to drop. She underwent  IR guided angiogram and embolization of the bleeding artery 01/27/20. She is on heparin gtt , Her Hb slightly dropped overnight to 7.6 , back to Hb 8.5. remained stable.  Coumadin resumed last 9/10 , remains on heparin until INR therapeutic.   Assessment & Plan:   Principal Problem:   Retroperitoneal hemorrhage Active Problems:   End-stage renal disease on hemodialysis (HCC)   H/O heart valve replacement with mechanical valve   S/P mitral valve replacement with metallic valve   Nontraumatic retroperitoneal hematoma   Status post mechanical aortic valve replacement     Retroperitoneal hemorrhage- mechanical aortic and mitral valve - this appears to have been a spontaneous hemorrhage. - s/p 6 U PRBC, Vit K and 2 U FFP.  -  It has resolved and Coumadin was resumed with Heparin bridge on 8/23 - ultrasound of abdomen reveals stable retroperitoneal bleed. - 9/2> CT abd/pelvis shows a stable hematoma. - patient had recurrent severe left flank pain which she states feels similar to the pain she had when she was found to have the RP hemorrhage -  Dilaudid IV for her pain was ordered and increased her Oxycodone - Hb dropped to 8  9/3  - reordered a CT scan and her hematoma does appear larger than before thus, consulted IR for an embolization - per IR, it is difficult to tell if she is still bleeding , Dr Earleen Newport requests  that we decrease her INR to below 2.0 for the angiogram  - She was given Vitamin K 5 mg  - cont to follow Hemoglobin closely and transfuse if < 7.  - She underwent angiogram followed by embolization of bleeding artery by IR  01/27/20, tolerated well -  Her hb dropped to 6.8 despite embolization. She received 2 more units PRBC post Hb 8.5 -  Continue heparin gtt, Hb remained stable afterwards, flank pain better,   - coumadin resumed 9/10 , continue heparin until INR therapeutic. -   continue to moniter H and H  H/o : Aortic and mitral valve replacement: She was on Coumadin,  Coumadin was on hold because of retroperitoneal bleed. Now bleeding has stopped,  hemoglobin remained stable,  remains on heparin gtt., Coumadin resumed from 9/10 until INR therapeutic.  Nausea and Vomiting - Improved - patient claims the pain is causing this but it may also be the Dilauidid- continue PRN Zofran - Nausea and vomiting resolved.    Gross hematuria>>> resolved - obtained CT abd./pelvis to see if a source of bleeding can be found but this was unremarkable. - current amount of blood is small. -  Urology consulted if she continues to have this issue when close to d/c,  - She will make follow up appointment for urology to investigate this further after discharge    End-stage renal disease on hemodialysis  Secondary hyperparathyroidism - cont HD on MWF   DVT prophylaxis: Heparin gtt / coumadin resumed Code Status: Full Family  Communication: No family at bed side. Disposition Plan:  Dispo: The patient is from: Home  Anticipated d/c is to: Home  Anticipated d/c date is:1-2 days  Patient currently is not medically stable to d/c. Ongoing retroperitoneal bleeding/subtherapeutic INR.   Consultants:    Nephrology, Cardiology, Urology, IR  Procedures:   Antimicrobials: Anti-infectives (From admission, onward)   None     Subjective: Patient was seen and  examined in the morning..  Overnight events noted.  She reports left flank pain has resolved.  Her hemoglobin remains stable.  She was noted on room air,  saturating 92%.  States she uses oxygen when she needs.  INR is still 1.1 and was given 6 mg of Coumadin last night. Objective: Vitals:   01/30/20 2041 01/31/20 0100 01/31/20 0511 01/31/20 1131  BP: 129/83  132/81 131/80  Pulse: 99  89 92  Resp: 16  15 18   Temp: 98.9 F (37.2 C)  98.5 F (36.9 C) 98.5 F (36.9 C)  TempSrc: Oral  Oral Oral  SpO2: 95%  100% 98%  Weight:  62.8 kg    Height:        Intake/Output Summary (Last 24 hours) at 01/31/2020 1422 Last data filed at 01/31/2020 0823 Gross per 24 hour  Intake 340 ml  Output --  Net 340 ml   Filed Weights   01/30/20 0330 01/30/20 0709 01/31/20 0100  Weight: 65.1 kg 61.5 kg 62.8 kg    Examination:  General exam: Appears calm and comfortable. Alert, oriented.  Respiratory system: Clear to auscultation. Respiratory effort normal. Cardiovascular system: S1 & S2 heard, RRR. No JVD, murmurs, rubs, gallops or clicks. No pedal edema. Gastrointestinal system: Abdomen is nondistended, soft and mildly tender in left flank.  No organomegaly or masses felt. Normal bowel sounds heard. Central nervous system: Alert and oriented. No focal neurological deficits. Extremities:  No leg edema, no cyanosis, no clubbing. Skin: No rashes, lesions or ulcers Psychiatry: Judgement and insight appear normal. Mood & affect appropriate.     Data Reviewed: I have personally reviewed following labs and imaging studies  CBC: Recent Labs  Lab 01/28/20 0407 01/28/20 1312 01/29/20 1333 01/29/20 2131 01/30/20 0144 01/30/20 1224 01/31/20 0607  WBC 12.6*  --  9.3 9.4 9.2  --  7.8  HGB 6.8*   < > 7.9* 7.7* 7.6* 8.5* 7.8*  HCT 21.1*   < > 24.4* 24.0* 23.8* 26.5* 24.7*  MCV 95.9  --  96.1 96.8 96.7  --  100.0  PLT 214  --  242 234 247  --  263   < > = values in this interval not displayed.    Basic Metabolic Panel: Recent Labs  Lab 01/27/20 0337 01/28/20 0407 01/29/20 1333 01/30/20 0144 01/31/20 0607  NA 129* 133* 129*  129* 129* 132*  K 4.2 3.6 3.7  3.8 3.9 3.6  CL 90* 93* 91*  91* 89* 94*  CO2 27 28 25  25 25 27   GLUCOSE 108* 119* 116*  117* 96 99  BUN 28* 16 33*  33* 45* 26*  CREATININE 7.04* 4.83* 7.50*  7.42* 8.66* 6.12*  CALCIUM 9.5 8.2* 8.4*  8.4* 8.8* 9.3  MG  --  1.8 1.8  --  1.9  PHOS 5.7* 4.4 4.1  4.1 3.9 3.5   GFR: Estimated Creatinine Clearance: 9.5 mL/min (A) (by C-G formula based on SCr of 6.12 mg/dL (H)). Liver Function Tests: Recent Labs  Lab 01/27/20 2426 01/28/20 0407 01/29/20 1333 01/30/20 0144 01/31/20 8341  AST  --   --  22  --   --   ALT  --   --  16  --   --   ALKPHOS  --   --  97  --   --   BILITOT  --   --  0.8  --   --   PROT  --   --  6.9  --   --   ALBUMIN 2.5* 2.4* 2.5*  2.5* 2.4* 2.4*   No results for input(s): LIPASE, AMYLASE in the last 168 hours. No results for input(s): AMMONIA in the last 168 hours. Coagulation Profile: Recent Labs  Lab 01/27/20 0337 01/28/20 0407 01/29/20 1333 01/30/20 0144 01/31/20 0607  INR 1.4* 1.2 1.1 1.1 1.1   Cardiac Enzymes: No results for input(s): CKTOTAL, CKMB, CKMBINDEX, TROPONINI in the last 168 hours. BNP (last 3 results) No results for input(s): PROBNP in the last 8760 hours. HbA1C: No results for input(s): HGBA1C in the last 72 hours. CBG: No results for input(s): GLUCAP in the last 168 hours. Lipid Profile: No results for input(s): CHOL, HDL, LDLCALC, TRIG, CHOLHDL, LDLDIRECT in the last 72 hours. Thyroid Function Tests: No results for input(s): TSH, T4TOTAL, FREET4, T3FREE, THYROIDAB in the last 72 hours. Anemia Panel: No results for input(s): VITAMINB12, FOLATE, FERRITIN, TIBC, IRON, RETICCTPCT in the last 72 hours. Sepsis Labs: No results for input(s): PROCALCITON, LATICACIDVEN in the last 168 hours.  No results found for this or any previous visit (from  the past 240 hour(s)).   Radiology Studies: No results found.  Scheduled Meds: . sodium chloride   Intravenous Once  . sodium chloride   Intravenous Once  . calcitRIOL  2 mcg Oral Q M,W,F  . cinacalcet  180 mg Oral Q M,W,F-HD  . docusate sodium  100 mg Oral Daily  . ferric citrate  420 mg Oral TID WC  . metoprolol tartrate  25 mg Oral BID  . multivitamin  1 tablet Oral QHS  . pantoprazole  40 mg Oral QAC breakfast  . polyethylene glycol  17 g Oral BID  . senna  1 tablet Oral Daily  . simethicone  80 mg Oral TID  . warfarin  7.5 mg Oral ONCE-1600  . Warfarin - Pharmacist Dosing Inpatient   Does not apply q1600   Continuous Infusions: . sodium chloride    . heparin 1,100 Units/hr (01/30/20 2248)     LOS: 25 days    Time spent: 25 mins.    Shawna Clamp, MD [facility]   If 7PM-7AM, please contact night-coverage

## 2020-02-01 LAB — CBC
HCT: 24.6 % — ABNORMAL LOW (ref 36.0–46.0)
Hemoglobin: 7.8 g/dL — ABNORMAL LOW (ref 12.0–15.0)
MCH: 32 pg (ref 26.0–34.0)
MCHC: 31.7 g/dL (ref 30.0–36.0)
MCV: 100.8 fL — ABNORMAL HIGH (ref 80.0–100.0)
Platelets: 293 10*3/uL (ref 150–400)
RBC: 2.44 MIL/uL — ABNORMAL LOW (ref 3.87–5.11)
RDW: 16.2 % — ABNORMAL HIGH (ref 11.5–15.5)
WBC: 8.5 10*3/uL (ref 4.0–10.5)
nRBC: 0.2 % (ref 0.0–0.2)

## 2020-02-01 LAB — RENAL FUNCTION PANEL
Albumin: 2.4 g/dL — ABNORMAL LOW (ref 3.5–5.0)
Anion gap: 12 (ref 5–15)
BUN: 44 mg/dL — ABNORMAL HIGH (ref 6–20)
CO2: 25 mmol/L (ref 22–32)
Calcium: 9.6 mg/dL (ref 8.9–10.3)
Chloride: 92 mmol/L — ABNORMAL LOW (ref 98–111)
Creatinine, Ser: 8.43 mg/dL — ABNORMAL HIGH (ref 0.44–1.00)
GFR calc Af Amer: 6 mL/min — ABNORMAL LOW (ref >60)
GFR calc non Af Amer: 5 mL/min — ABNORMAL LOW (ref >60)
Glucose, Bld: 89 mg/dL (ref 70–99)
Phosphorus: 3.3 mg/dL (ref 2.5–4.6)
Potassium: 4.1 mmol/L (ref 3.5–5.1)
Sodium: 129 mmol/L — ABNORMAL LOW (ref 135–145)

## 2020-02-01 LAB — HEPARIN LEVEL (UNFRACTIONATED)
Heparin Unfractionated: 0.24 IU/mL — ABNORMAL LOW (ref 0.30–0.70)
Heparin Unfractionated: 0.31 IU/mL (ref 0.30–0.70)

## 2020-02-01 LAB — PROTIME-INR
INR: 1.3 — ABNORMAL HIGH (ref 0.8–1.2)
Prothrombin Time: 15.4 seconds — ABNORMAL HIGH (ref 11.4–15.2)

## 2020-02-01 MED ORDER — WARFARIN SODIUM 7.5 MG PO TABS
7.5000 mg | ORAL_TABLET | Freq: Once | ORAL | Status: AC
  Administered 2020-02-01: 7.5 mg via ORAL
  Filled 2020-02-01: qty 1

## 2020-02-01 MED ORDER — DARBEPOETIN ALFA 60 MCG/0.3ML IJ SOSY
PREFILLED_SYRINGE | INTRAMUSCULAR | Status: AC
  Filled 2020-02-01: qty 0.3

## 2020-02-01 MED ORDER — CALCITRIOL 0.5 MCG PO CAPS
ORAL_CAPSULE | ORAL | Status: AC
  Filled 2020-02-01: qty 4

## 2020-02-01 MED ORDER — DARBEPOETIN ALFA 60 MCG/0.3ML IJ SOSY
60.0000 ug | PREFILLED_SYRINGE | INTRAMUSCULAR | Status: DC
  Administered 2020-02-01: 60 ug via INTRAVENOUS
  Filled 2020-02-01: qty 0.3

## 2020-02-01 MED ORDER — ACETAMINOPHEN 325 MG PO TABS
ORAL_TABLET | ORAL | Status: AC
  Administered 2020-02-01: 650 mg via ORAL
  Filled 2020-02-01: qty 2

## 2020-02-01 MED ORDER — HYDROCORTISONE 1 % EX CREA
TOPICAL_CREAM | Freq: Four times a day (QID) | CUTANEOUS | Status: DC | PRN
  Filled 2020-02-01: qty 28

## 2020-02-01 NOTE — Plan of Care (Signed)
  Problem: Coping: Goal: Level of anxiety will decrease Outcome: Completed/Met   Problem: Elimination: Goal: Will not experience complications related to bowel motility Outcome: Completed/Met   Problem: Pain Managment: Goal: General experience of comfort will improve Outcome: Completed/Met

## 2020-02-01 NOTE — Progress Notes (Addendum)
ANTICOAGULATION CONSULT NOTE   Pharmacy Consult for Heparin, Warfarin Indication:  Mechanical AVR/MVR  No Known Allergies  Patient Measurements: Height: 5\' 6"  (167.6 cm) Weight: 61.3 kg (135 lb 2.3 oz) IBW/kg (Calculated) : 59.3 Heparin Dosing Weight: 62 kg  Vital Signs: Temp: 98.3 F (36.8 C) (09/13 1200) Temp Source: Oral (09/13 1200) BP: 122/68 (09/13 1200) Pulse Rate: 100 (09/13 1200)  Labs: Recent Labs    01/30/20 0144 01/30/20 1224 01/31/20 0607 01/31/20 0607 01/31/20 1930 02/01/20 0502 02/01/20 1645  HGB 7.6*   < > 7.8*   < > 8.2* 7.8*  --   HCT 23.8*   < > 24.7*  --  26.0* 24.6*  --   PLT 247  --  263  --   --  293  --   LABPROT 13.7  --  13.9  --   --  15.4*  --   INR 1.1  --  1.1  --   --  1.3*  --   HEPARINUNFRC 0.30  --  0.33  --   --  0.24* 0.31  CREATININE 8.66*  --  6.12*  --   --  8.43*  --    < > = values in this interval not displayed.    Estimated Creatinine Clearance: 6.9 mL/min (A) (by C-G formula based on SCr of 8.43 mg/dL (H)).   Assessment: 59 yr old woman on warfarin PTA for hx mechanical AVR/MVR (goal 2.5-3.5), reversed with vit K 10 mg IV + 2 units FFP  + 4 units PRBcs on 8/18 and another vit K po 5 mg on 9/7 with 2 unit RBCs 9/8-9/9, due to continued retroperitoneal bleed.  PTA Warfarin regimen: 9 mg daily except 6 mg on Mondays - last 8/18 (INR was 3.6 on admit 8/18)  Heparin level ~4 hrs after heparin infusion was increased to 1200 units/hr was 0.31 units/ml, which is within the goal range for this pt. INR remains subtherapeutic, but uptrend to 1.3, day 3 from warfarin restart.  H/H low but stable, plts WNL. Per RN, no issues with IV or bleeding observed.    Goal of Therapy:  Heparin level 0.3 -0.5 units/ml (low goal due to bleed) INR 2.5- 3.5  Monitor platelets by anticoagulation protocol: Yes   Plan:   Continue heparin infusion at 1200 units/hr Check heparin level in 8 hrs Give warfarin 7.5 mg PO x 1 tonight - completed Monitor  daily INR, heparin level, CBC Monitor for signs/symptoms of bleeding  Gillermina Hu, PharmD, BCPS, [facility] Clinical Pharmacist 02/01/2020 6:24 PM

## 2020-02-01 NOTE — Progress Notes (Signed)
PT Cancellation Note  Patient Details Name: Daniqua Campoy MRN: [id number] DOB: [birth year]   Cancelled Treatment:    Reason Eval/Treat Not Completed: Patient at procedure or test/unavailable (Pt in HD. Will check back as able.  )   Denice Paradise 02/01/2020, 8:47 AM Maci Eickholt W,PT Acute Rehabilitation Services Pager:  [phone]  Office:  [phone]

## 2020-02-01 NOTE — Progress Notes (Signed)
Nephrology Follow-Up Consult note   Assessment/Recommendations: [name] is a/an 59 y.o. female with a past medical history significant for ESRD, aortic and mitral valve replacement on Coumadin, admitted for retroperitoneal bleed.     OP HD:MWF  4h 61kg 450/800 2/2 bath P2 Hep 1800 -sensipar 180mg  -calcitriol 27mcg -no esa, no iron   1.Lretroperitonealperirenal bleed/hematoma - Bleed seen on CT 8/18.  Status post multiple blood products requiring IV heparin for bridge given history of valve replacement.  Now status post renal embolization with VIR.  Hemoglobin seems to be stabilizing.  -Pain and nausea continue to improve - Hb leveling off in the mid to high 7's - coumadin restarted 9/10, continues on IV heparin 2. Rectal bleeding- 1 episode, no more episodes per patient 3.Hx AVR/MVR-anticoagulation as above 4. ESRD- on HD MWF.HD today tolerating well 5. Anemiaof CKD- As above S/p 6 units pRBC. TSAT 84% 8/30. Start aranesp 49mcg qweekly.  6. Secondary hyperparathyroidism- Phos at goal. 3.4 cont low Ca bath. . Continue calcitriol and sensipar. Noted on Auryxia 840mg  at home decreased to 420mg . CTM 7.HTN/volume- BP stable, UF to [name] 8. Nutrition- Renal diet w/fluid restrictions. Alb 2.8. Protein supplements.     Recommendations conveyed to primary service.    [facility] 02/01/2020 9:17 AM  ___________________________________________________________  CC: ESRD  Interval History/Subjective: Patient seen on dialysis today tolerating well.  Current ultrafiltration goal of 3 L.  Denies shortness of breath or chest pain.  Mild back pain but overall improved.  No other complaints.   Medications:  Current Facility-Administered Medications  Medication Dose Route Frequency Provider Last Rate Last Admin  . 0.9 %  sodium chloride infusion (Manually program via Guardrails IV Fluids)   Intravenous Once Zierle-Ghosh,  Asia B, DO      . 0.9 %  sodium chloride infusion (Manually program via Guardrails IV Fluids)   Intravenous Once Shawna Clamp, MD      . 0.9 %  sodium chloride infusion   Intravenous PRN Charlynne Cousins, MD      . acetaminophen (TYLENOL) tablet 650 mg  650 mg Oral Q6H PRN Charlynne Cousins, MD   650 mg at 02/01/20 0811  . calcitRIOL (ROCALTROL) 0.5 MCG capsule           . calcitRIOL (ROCALTROL) capsule 2 mcg  2 mcg Oral Q M,W,F Gean Quint, MD   2 mcg at 02/01/20 [phone]  . cinacalcet (SENSIPAR) tablet 180 mg  180 mg Oral Q M,W,F-HD Penninger, Lindsay, PA   180 mg at 01/27/20 1210  . docusate sodium (COLACE) capsule 100 mg  100 mg Oral Daily Bowser, Laurel Dimmer, NP   100 mg at 01/31/20 0959  . ferric citrate (AURYXIA) tablet 420 mg  420 mg Oral TID WC Ernest Haber, PA-C   420 mg at 02/01/20 0700  . heparin ADULT infusion 100 units/mL (25000 units/246mL sodium chloride 0.45%)  1,200 Units/hr Intravenous Continuous Bertis Ruddy, RPH 11 mL/hr at 01/31/20 2129 1,100 Units/hr at 01/31/20 2129  . hydrALAZINE (APRESOLINE) injection 10 mg  10 mg Intravenous Q6H PRN Debbe Odea, MD   10 mg at 01/24/20 1153  . hydrocortisone cream 1 %   Topical QID PRN Shalhoub, Sherryll Burger, MD   Given at 02/01/20 0230  . iohexol (OMNIPAQUE) 300 MG/ML solution 150 mL  150 mL Intra-arterial Once PRN Jacqulynn Cadet, MD      . metoprolol tartrate (LOPRESSOR) tablet 25 mg  25 mg Oral BID Fay Records,  MD   25 mg at 01/31/20 2137  . morphine 2 MG/ML injection 2-4 mg  2-4 mg Intravenous Q3H PRN Debbe Odea, MD   4 mg at 01/28/20 0859  . multivitamin (RENA-VIT) tablet 1 tablet  1 tablet Oral QHS Roney Jaffe, MD   1 tablet at 01/31/20 2136  . ondansetron (ZOFRAN) injection 4 mg  4 mg Intravenous Q6H PRN Debbe Odea, MD   4 mg at 01/29/20 1127  . ondansetron (ZOFRAN-ODT) disintegrating tablet 4 mg  4 mg Oral Q8H PRN Icard, Bradley L, DO   4 mg at 01/18/20 1818  . oxyCODONE (Oxy IR/ROXICODONE) immediate release  tablet 10 mg  10 mg Oral Q4H PRN Debbe Odea, MD   10 mg at 01/31/20 2137  . pantoprazole (PROTONIX) EC tablet 40 mg  40 mg Oral QAC breakfast Donnamae Jude, RPH   40 mg at 01/31/20 0900  . polyethylene glycol (MIRALAX / GLYCOLAX) packet 17 g  17 g Oral BID Charlynne Cousins, MD   17 g at 01/31/20 0957  . senna (SENOKOT) tablet 8.6 mg  1 tablet Oral Daily Bowser, Grace E, NP   8.6 mg at 01/30/20 0815  . simethicone (MYLICON) chewable tablet 80 mg  80 mg Oral TID Cristal Generous, NP   80 mg at 01/31/20 2136  . warfarin (COUMADIN) tablet 7.5 mg  7.5 mg Oral ONCE-1600 Bertis Ruddy, RPH      . Warfarin - Pharmacist Dosing Inpatient   Does not apply q1600 Karren Cobble, [facility]   Given at 01/30/20 1721      Review of Systems: 10 systems reviewed and negative except per interval history/subjective  Physical Exam: Vitals:   02/01/20 0830 02/01/20 0900  BP: 134/75 115/68  Pulse: 96 94  Resp:    Temp:    SpO2:     No intake/output data recorded.  Intake/Output Summary (Last 24 hours) at 02/01/2020 0917 Last data filed at 02/01/2020 4163 Gross per 24 hour  Intake 559.78 ml  Output 1 ml  Net 558.78 ml   Constitutional: well-appearing, no acute distress ENMT: ears and nose without scars or lesions, MMM CV: normal rate, no edema Respiratory: bilateral chest rise, normal work of breathing Gastrointestinal: soft, mild tenderness to palpation on left side, no palpable masses or hernias Skin: no visible lesions or rashes Psych: alert, judgement/insight appropriate, appropriate mood and affect   Test Results I personally reviewed new and old clinical labs and radiology tests Lab Results  Component Value Date   NA 129 (L) 02/01/2020   K 4.1 02/01/2020   CL 92 (L) 02/01/2020   CO2 25 02/01/2020   BUN 44 (H) 02/01/2020   CREATININE 8.43 (H) 02/01/2020   CALCIUM 9.6 02/01/2020   ALBUMIN 2.4 (L) 02/01/2020   PHOS 3.3 02/01/2020

## 2020-02-01 NOTE — Progress Notes (Signed)
PROGRESS NOTE    [name]  WGN:[id number] DOB: [birth year] DOA: 01/06/2020 PCP: Benito Mccreedy, MD   Brief Narrative: [name] an 59 y.o.female withpast medical history of end-stage renal disease on hemodialysis Monday Wednesday and Friday status post aortic and mitral valve replacement on Coumadin who presents with nausea and nonbloody emesis, She was found to have a spontaneous left retroperitoneal bleed. She status post 6 units of packed red blood cells,  2 units of fresh frozen plasma and vitamin K.  Her hemoglobin continued to drop. She underwent  IR guided angiogram and embolization of the bleeding artery 01/27/20. She is on heparin gtt , Her Hb slightly dropped overnight to 7.6 , back to Hb 8.5. remained stable.  Coumadin resumed last 9/10 , remains on heparin until INR therapeutic.   Assessment & Plan:   Principal Problem:   Retroperitoneal hemorrhage Active Problems:   End-stage renal disease on hemodialysis (HCC)   H/O heart valve replacement with mechanical valve   S/P mitral valve replacement with metallic valve   Nontraumatic retroperitoneal hematoma   Status post mechanical aortic valve replacement     Retroperitoneal hemorrhage- mechanical aortic and mitral valve - this appears to have been a spontaneous hemorrhage. - s/p 6 U PRBC, Vit K and 2 U FFP.  -  It has resolved and Coumadin was resumed with Heparin bridge on 8/23 - ultrasound of abdomen reveals stable retroperitoneal bleed. - 9/2> CT abd/pelvis shows a stable hematoma. - patient had recurrent severe left flank pain which she states feels similar to the pain she had when she was found to have the RP hemorrhage -  Dilaudid IV for her pain was ordered and increased her Oxycodone - Hb dropped to 8  9/3  - reordered a CT scan and her hematoma does appear larger than before thus, consulted IR for an embolization - per IR, it is difficult to tell if she is still bleeding , Dr Earleen Newport requests  that we decrease her INR to below 2.0 for the angiogram  - She was given Vitamin K 5 mg  -  cont to follow Hemoglobin closely and transfuse if < 7.  -  She underwent angiogram followed by embolization of bleeding artery by IR  01/27/20, tolerated well -  Her hb dropped to 6.8 despite embolization. She received 2 more units PRBC post Hb 8.5 -  Continue heparin gtt, Hb remained stable afterwards, flank pain better,   - Coumadin resumed 9/10 , continue heparin until INR therapeutic. -   continue to moniter H and H  H/o : Aortic and mitral valve replacement: She was on Coumadin,  Coumadin was on hold because of retroperitoneal bleed. Now bleeding has stopped,  hemoglobin remained stable,  remains on heparin gtt., Coumadin resumed from 9/10 until INR therapeutic then dc heparin.  Nausea and Vomiting - Improved - patient claims the pain is causing this but it may also be the Dilauidid- continue PRN Zofran - Nausea and vomiting resolved.    Gross hematuria>>> resolved - obtained CT abd./pelvis to see if a source of bleeding can be found but this was unremarkable. - current amount of blood is small. -  Urology consulted if she continues to have this issue when close to d/c,  - She will make follow up appointment for urology to investigate this further after discharge    End-stage renal disease on hemodialysis  Secondary hyperparathyroidism - cont HD on MWF   DVT prophylaxis: Heparin gtt / coumadin  resumed Code Status: Full Family Communication: No family at bed side. Disposition Plan:  Dispo: The patient is from: Home  Anticipated d/c is to: Home  Anticipated d/c date is:1-2 days  Patient currently is not medically stable to d/c. Ongoing retroperitoneal bleeding/subtherapeutic INR.   Consultants:    Nephrology, Cardiology, Urology, IR  Procedures:   Antimicrobials: Anti-infectives (From admission, onward)   None     Subjective: Patient  was seen and examined in the morning in the HD unit...  Overnight events noted.  Her hemoglobin has stablized.   She reports feeling better, Left flank pain has resolved, denies any complaints.   Objective: Vitals:   02/01/20 1030 02/01/20 1100 02/01/20 1130 02/01/20 1200  BP: 131/74 130/77 118/68 122/68  Pulse: (!) 103 (!) 108 100 100  Resp:    17  Temp:    98.3 F (36.8 C)  TempSrc:    Oral  SpO2:      Weight:    61.3 kg  Height:        Intake/Output Summary (Last 24 hours) at 02/01/2020 1324 Last data filed at 02/01/2020 1200 Gross per 24 hour  Intake 559.78 ml  Output 3001 ml  Net -2441.22 ml   Filed Weights   02/01/20 0614 02/01/20 0740 02/01/20 1200  Weight: 63.7 kg 64.2 kg 61.3 kg    Examination:  General exam: Appears calm and comfortable. Alert, oriented x 3  Respiratory system: Clear to auscultation. Respiratory effort normal. Cardiovascular system: S1 & S2 heard, RRR. No JVD, murmurs, rubs, gallops or clicks. No pedal edema. HD port noted.  Gastrointestinal system: Abdomen is nondistended, soft and mildly tender in left flank.  No organomegaly or masses felt. Normal bowel sounds heard. Central nervous system: Alert and oriented. No focal neurological deficits. Extremities:  No leg edema, no cyanosis, no clubbing. Skin: No rashes, lesions or ulcers Psychiatry: Judgement and insight appear normal. Mood & affect appropriate.     Data Reviewed: I have personally reviewed following labs and imaging studies  CBC: Recent Labs  Lab 01/29/20 1333 01/29/20 1333 01/29/20 2131 01/29/20 2131 01/30/20 0144 01/30/20 1224 01/31/20 0607 01/31/20 1930 02/01/20 0502  WBC 9.3  --  9.4  --  9.2  --  7.8  --  8.5  HGB 7.9*   < > 7.7*   < > 7.6* 8.5* 7.8* 8.2* 7.8*  HCT 24.4*   < > 24.0*   < > 23.8* 26.5* 24.7* 26.0* 24.6*  MCV 96.1  --  96.8  --  96.7  --  100.0  --  100.8*  PLT 242  --  234  --  247  --  263  --  293   < > = values in this interval not displayed.    Basic Metabolic Panel: Recent Labs  Lab 01/28/20 0407 01/29/20 1333 01/30/20 0144 01/31/20 0607 02/01/20 0502  NA 133* 129*  129* 129* 132* 129*  K 3.6 3.7  3.8 3.9 3.6 4.1  CL 93* 91*  91* 89* 94* 92*  CO2 28 25  25 25 27 25   GLUCOSE 119* 116*  117* 96 99 89  BUN 16 33*  33* 45* 26* 44*  CREATININE 4.83* 7.50*  7.42* 8.66* 6.12* 8.43*  CALCIUM 8.2* 8.4*  8.4* 8.8* 9.3 9.6  MG 1.8 1.8  --  1.9  --   PHOS 4.4 4.1  4.1 3.9 3.5 3.3   GFR: Estimated Creatinine Clearance: 6.9 mL/min (A) (by C-G formula based on SCr of  8.43 mg/dL (H)). Liver Function Tests: Recent Labs  Lab 01/28/20 0407 01/29/20 1333 01/30/20 0144 01/31/20 0607 02/01/20 0502  AST  --  22  --   --   --   ALT  --  16  --   --   --   ALKPHOS  --  97  --   --   --   BILITOT  --  0.8  --   --   --   PROT  --  6.9  --   --   --   ALBUMIN 2.4* 2.5*  2.5* 2.4* 2.4* 2.4*   No results for input(s): LIPASE, AMYLASE in the last 168 hours. No results for input(s): AMMONIA in the last 168 hours. Coagulation Profile: Recent Labs  Lab 01/28/20 0407 01/29/20 1333 01/30/20 0144 01/31/20 0607 02/01/20 0502  INR 1.2 1.1 1.1 1.1 1.3*   Cardiac Enzymes: No results for input(s): CKTOTAL, CKMB, CKMBINDEX, TROPONINI in the last 168 hours. BNP (last 3 results) No results for input(s): PROBNP in the last 8760 hours. HbA1C: No results for input(s): HGBA1C in the last 72 hours. CBG: No results for input(s): GLUCAP in the last 168 hours. Lipid Profile: No results for input(s): CHOL, HDL, LDLCALC, TRIG, CHOLHDL, LDLDIRECT in the last 72 hours. Thyroid Function Tests: No results for input(s): TSH, T4TOTAL, FREET4, T3FREE, THYROIDAB in the last 72 hours. Anemia Panel: No results for input(s): VITAMINB12, FOLATE, FERRITIN, TIBC, IRON, RETICCTPCT in the last 72 hours. Sepsis Labs: No results for input(s): PROCALCITON, LATICACIDVEN in the last 168 hours.  No results found for this or any previous visit (from the  past 240 hour(s)).   Radiology Studies: No results found.  Scheduled Meds: . sodium chloride   Intravenous Once  . sodium chloride   Intravenous Once  . calcitRIOL  2 mcg Oral Q M,W,F  . cinacalcet  180 mg Oral Q M,W,F-HD  . darbepoetin (ARANESP) injection - DIALYSIS  60 mcg Intravenous Q Mon-HD  . docusate sodium  100 mg Oral Daily  . ferric citrate  420 mg Oral TID WC  . metoprolol tartrate  25 mg Oral BID  . multivitamin  1 tablet Oral QHS  . pantoprazole  40 mg Oral QAC breakfast  . polyethylene glycol  17 g Oral BID  . senna  1 tablet Oral Daily  . simethicone  80 mg Oral TID  . warfarin  7.5 mg Oral ONCE-1600  . Warfarin - Pharmacist Dosing Inpatient   Does not apply q1600   Continuous Infusions: . sodium chloride    . heparin 1,200 Units/hr (02/01/20 1242)     LOS: 26 days    Time spent: 25 mins.    Shawna Clamp, MD [facility]   If 7PM-7AM, please contact night-coverage

## 2020-02-01 NOTE — Progress Notes (Signed)
PT Cancellation Note  Patient Details Name: Kearia Yin MRN: [id number] DOB: 07-Feb-1961   Cancelled Treatment:    Reason Eval/Treat Not Completed: Other (comment) (Refused in pm due to nausea. Nurse aware. )   Godfrey Pick Oluwaseyi Tull 02/01/2020, 3:42 PM Ahmira Boisselle W,PT Acute Rehabilitation Services Pager:  [phone]  Office:  [phone]

## 2020-02-01 NOTE — Plan of Care (Signed)
  Problem: Clinical Measurements: Goal: Ability to maintain clinical measurements within normal limits will improve Outcome: Progressing Goal: Will remain free from infection Outcome: Progressing Goal: Respiratory complications will improve Outcome: Progressing Goal: Cardiovascular complication will be avoided Outcome: Progressing   Problem: Activity: Goal: Risk for activity intolerance will decrease Outcome: Progressing   Problem: Education: Goal: Ability to demonstrate management of disease process will improve Outcome: Progressing Goal: Ability to verbalize understanding of medication therapies will improve Outcome: Progressing   Problem: Activity: Goal: Capacity to carry out activities will improve Outcome: Progressing   Problem: Cardiac: Goal: Ability to achieve and maintain adequate cardiopulmonary perfusion will improve Outcome: Progressing

## 2020-02-01 NOTE — Progress Notes (Signed)
[place] for heparin, warfarin Indication: mechanical AVR/MVR  No Known Allergies  Patient Measurements: Height: 5\' 6"  (167.6 cm) Weight: 63.7 kg (140 lb 6.9 oz) IBW/kg (Calculated) : 59.3 Heparin Dosing Weight: 62kg  Vital Signs: Temp: 98.5 F (36.9 C) (09/13 0611) Temp Source: Oral (09/13 0611) BP: 148/89 (09/13 0611) Pulse Rate: 91 (09/13 0611)  Labs: Recent Labs    01/30/20 0144 01/30/20 1224 01/31/20 0607 01/31/20 0607 01/31/20 1930 02/01/20 0502  HGB 7.6*   < > 7.8*   < > 8.2* 7.8*  HCT 23.8*   < > 24.7*  --  26.0* 24.6*  PLT 247  --  263  --   --  293  LABPROT 13.7  --  13.9  --   --  15.4*  INR 1.1  --  1.1  --   --  1.3*  HEPARINUNFRC 0.30  --  0.33  --   --  0.24*  CREATININE 8.66*  --  6.12*  --   --  8.43*   < > = values in this interval not displayed.    Estimated Creatinine Clearance: 6.9 mL/min (A) (by C-G formula based on SCr of 8.43 mg/dL (H)).   Assessment: 59 yo W on warfarin PTA for hx mechanical AVR/MVR (goal 2.5-3.5), reversed with vit K 10 mg IV + 2 units FFP  + 4 units PRBcs on 8/18 and another vit K po 5mg  on 9/7 with 2 unit RBCs 9/8-9/9 due to continued retroperitoneal bleed.  PTA Warfarin regimen: 9 mg daily except 6 mg on Mondays - last 8/18 (INR was 3.6 on admit 8/18)    Heparin level subtherapeutic at 0.24 on 1100 units/hr, INR remains subtherapeutic but uptrend to 1.3, day 3 from warfarin restart.  H/H low but stable, plts wnl.    Goal of Therapy:  Heparin level 0.3 -0.5 units/ml (low goal due to bleed) INR 2.5- 3.5  Monitor platelets by anticoagulation protocol: Yes   Plan:  Increase heparin gtt to 1200 units/hr Give warfarin 7.5 mg PO x 1 tonight Daily INR, CBC, s/s bleeding F/u 8 hour heparin level  Bertis Ruddy, PharmD Clinical Pharmacist Please check AMION for all [facility] numbers 02/01/2020 7:14 AM

## 2020-02-02 LAB — PROTIME-INR
INR: 1.4 — ABNORMAL HIGH (ref 0.8–1.2)
Prothrombin Time: 16.8 seconds — ABNORMAL HIGH (ref 11.4–15.2)

## 2020-02-02 LAB — RENAL FUNCTION PANEL
Albumin: 2.3 g/dL — ABNORMAL LOW (ref 3.5–5.0)
Anion gap: 9 (ref 5–15)
BUN: 15 mg/dL (ref 6–20)
CO2: 28 mmol/L (ref 22–32)
Calcium: 8.7 mg/dL — ABNORMAL LOW (ref 8.9–10.3)
Chloride: 96 mmol/L — ABNORMAL LOW (ref 98–111)
Creatinine, Ser: 4.46 mg/dL — ABNORMAL HIGH (ref 0.44–1.00)
GFR calc Af Amer: 12 mL/min — ABNORMAL LOW (ref >60)
GFR calc non Af Amer: 10 mL/min — ABNORMAL LOW (ref >60)
Glucose, Bld: 132 mg/dL — ABNORMAL HIGH (ref 70–99)
Phosphorus: 2.4 mg/dL — ABNORMAL LOW (ref 2.5–4.6)
Potassium: 3.4 mmol/L — ABNORMAL LOW (ref 3.5–5.1)
Sodium: 133 mmol/L — ABNORMAL LOW (ref 135–145)

## 2020-02-02 LAB — CBC
HCT: 24.9 % — ABNORMAL LOW (ref 36.0–46.0)
Hemoglobin: 7.8 g/dL — ABNORMAL LOW (ref 12.0–15.0)
MCH: 32.1 pg (ref 26.0–34.0)
MCHC: 31.3 g/dL (ref 30.0–36.0)
MCV: 102.5 fL — ABNORMAL HIGH (ref 80.0–100.0)
Platelets: 308 10*3/uL (ref 150–400)
RBC: 2.43 MIL/uL — ABNORMAL LOW (ref 3.87–5.11)
RDW: 16.4 % — ABNORMAL HIGH (ref 11.5–15.5)
WBC: 8.9 10*3/uL (ref 4.0–10.5)
nRBC: 0.7 % — ABNORMAL HIGH (ref 0.0–0.2)

## 2020-02-02 LAB — HEPARIN LEVEL (UNFRACTIONATED): Heparin Unfractionated: 0.31 IU/mL (ref 0.30–0.70)

## 2020-02-02 MED ORDER — WARFARIN SODIUM 3 MG PO TABS
9.0000 mg | ORAL_TABLET | Freq: Once | ORAL | Status: AC
  Administered 2020-02-02: 9 mg via ORAL
  Filled 2020-02-02: qty 3

## 2020-02-02 MED ORDER — POTASSIUM PHOSPHATES 15 MMOLE/5ML IV SOLN
20.0000 mmol | Freq: Once | INTRAVENOUS | Status: AC
  Administered 2020-02-02: 20 mmol via INTRAVENOUS
  Filled 2020-02-02: qty 6.67

## 2020-02-02 NOTE — Progress Notes (Signed)
Nephrology Follow-Up Consult note   Assessment/Recommendations: [name] is a/an 59 y.o. female with a past medical history significant for ESRD, aortic and mitral valve replacement on Coumadin, admitted for retroperitoneal bleed.     OP HD:MWF  4h 61kg 450/800 2/2 bath P2 Hep 1800 -sensipar 180mg  -calcitriol 12mcg -no esa, no iron   1.Lretroperitonealperirenal bleed/hematoma - Bleed seen on CT 8/18.  Status post multiple blood products requiring IV heparin for bridge given history of valve replacement.  Now status post renal embolization with VIR.  Hemoglobin has stabilized - coumadin restarted 9/10, continues on IV heparin 3.Hx AVR/MVR-anticoagulation as above 4. ESRD- on HD MWF.HD today tolerating well 5. Anemiaof CKD- As above S/p 6 units pRBC. TSAT 84% 8/30.  Aranesp 39mcg qweekly started on 9/13.  6. Secondary hyperparathyroidism- Phos at goal. Continue calcitriol and sensipar. Noted on Auryxia 840mg  at home decreased to 420mg . CTM 7.HTN/volume- BP stable, UF to [name] 8. Nutrition- Renal diet w/fluid restrictions. Alb 2.8. Protein supplements.     Recommendations conveyed to primary service.    [facility] 02/02/2020 10:03 AM  ___________________________________________________________  CC: ESRD  Interval History/Subjective: Patient feels well at this time.  Denies significant complaints.  Tolerated dialysis with 3 L of ultrafiltration.  Hemoglobin stable at 7.8.  INR slowly rising   Medications:  Current Facility-Administered Medications  Medication Dose Route Frequency Provider Last Rate Last Admin  . 0.9 %  sodium chloride infusion (Manually program via Guardrails IV Fluids)   Intravenous Once Zierle-Ghosh, Asia B, DO      . 0.9 %  sodium chloride infusion (Manually program via Guardrails IV Fluids)   Intravenous Once Shawna Clamp, MD      . 0.9 %  sodium chloride infusion   Intravenous PRN  Charlynne Cousins, MD      . acetaminophen (TYLENOL) tablet 650 mg  650 mg Oral Q6H PRN Charlynne Cousins, MD   650 mg at 02/01/20 2106  . calcitRIOL (ROCALTROL) capsule 2 mcg  2 mcg Oral Q M,W,F Gean Quint, MD   2 mcg at 02/01/20 [phone]  . cinacalcet (SENSIPAR) tablet 180 mg  180 mg Oral Q M,W,F-HD Penninger, Lindsay, PA   180 mg at 02/01/20 1244  . Darbepoetin Alfa (ARANESP) injection 60 mcg  60 mcg Intravenous Q Mon-HD Reesa Chew, MD   60 mcg at 02/01/20 1038  . docusate sodium (COLACE) capsule 100 mg  100 mg Oral Daily Bowser, Laurel Dimmer, NP   100 mg at 02/02/20 0837  . ferric citrate (AURYXIA) tablet 420 mg  420 mg Oral TID WC Ernest Haber, PA-C   420 mg at 02/02/20 0836  . heparin ADULT infusion 100 units/mL (25000 units/263mL sodium chloride 0.45%)  1,200 Units/hr Intravenous Continuous Bertis Ruddy, RPH 12 mL/hr at 02/01/20 1918 1,200 Units/hr at 02/01/20 1918  . hydrALAZINE (APRESOLINE) injection 10 mg  10 mg Intravenous Q6H PRN Debbe Odea, MD   10 mg at 01/24/20 1153  . hydrocortisone cream 1 %   Topical QID PRN Shalhoub, Sherryll Burger, MD   Given at 02/01/20 0230  . iohexol (OMNIPAQUE) 300 MG/ML solution 150 mL  150 mL Intra-arterial Once PRN Jacqulynn Cadet, MD      . metoprolol tartrate (LOPRESSOR) tablet 25 mg  25 mg Oral BID Fay Records, MD   25 mg at 02/02/20 0837  . morphine 2 MG/ML injection 2-4 mg  2-4 mg Intravenous Q3H PRN Debbe Odea, MD   4 mg  at 01/28/20 0859  . multivitamin (RENA-VIT) tablet 1 tablet  1 tablet Oral QHS Roney Jaffe, MD   1 tablet at 02/01/20 2058  . ondansetron (ZOFRAN) injection 4 mg  4 mg Intravenous Q6H PRN Debbe Odea, MD   4 mg at 02/01/20 1507  . ondansetron (ZOFRAN-ODT) disintegrating tablet 4 mg  4 mg Oral Q8H PRN Icard, Bradley L, DO   4 mg at 01/18/20 1818  . oxyCODONE (Oxy IR/ROXICODONE) immediate release tablet 10 mg  10 mg Oral Q4H PRN Debbe Odea, MD   10 mg at 02/01/20 1252  . pantoprazole (PROTONIX) EC tablet 40 mg   40 mg Oral QAC breakfast Donnamae Jude, RPH   40 mg at 02/02/20 0836  . polyethylene glycol (MIRALAX / GLYCOLAX) packet 17 g  17 g Oral BID Charlynne Cousins, MD   17 g at 02/02/20 6237  . potassium PHOSPHATE 20 mmol in dextrose 5 % 500 mL infusion  20 mmol Intravenous Once Shawna Clamp, MD 84 mL/hr at 02/02/20 0913 20 mmol at 02/02/20 0913  . senna (SENOKOT) tablet 8.6 mg  1 tablet Oral Daily Bowser, Grace E, NP   8.6 mg at 02/02/20 0837  . simethicone (MYLICON) chewable tablet 80 mg  80 mg Oral TID Cristal Generous, NP   80 mg at 02/02/20 0837  . warfarin (COUMADIN) tablet 9 mg  9 mg Oral ONCE-1600 Bertis Ruddy, RPH      . Warfarin - Pharmacist Dosing Inpatient   Does not apply q1600 Karren Cobble [facility]   Given at 01/30/20 1721      Review of Systems: 10 systems reviewed and negative except per interval history/subjective  Physical Exam: Vitals:   02/01/20 2057 02/02/20 0543  BP: 138/86 124/73  Pulse: 94 88  Resp: 16 19  Temp: 99.2 F (37.3 C) 98.8 F (37.1 C)  SpO2: 98% 100%   No intake/output data recorded.  Intake/Output Summary (Last 24 hours) at 02/02/2020 1003 Last data filed at 02/01/2020 2030 Gross per 24 hour  Intake 560 ml  Output 3001 ml  Net -2441 ml   Constitutional: well-appearing, no acute distress ENMT: ears and nose without scars or lesions, MMM CV: normal rate, no edema Respiratory: bilateral chest rise, normal work of breathing Gastrointestinal: soft, mild tenderness to palpation on left side, no palpable masses or hernias Skin: no visible lesions or rashes Psych: alert, judgement/insight appropriate, appropriate mood and affect   Test Results I personally reviewed new and old clinical labs and radiology tests Lab Results  Component Value Date   NA 133 (L) 02/02/2020   K 3.4 (L) 02/02/2020   CL 96 (L) 02/02/2020   CO2 28 02/02/2020   BUN 15 02/02/2020   CREATININE 4.46 (H) 02/02/2020   CALCIUM 8.7 (L) 02/02/2020   ALBUMIN 2.3 (L)  02/02/2020   PHOS 2.4 (L) 02/02/2020

## 2020-02-02 NOTE — Plan of Care (Signed)
  Problem: Health Behavior/Discharge Planning: Goal: Ability to manage health-related needs will improve Outcome: Progressing   Problem: Clinical Measurements: Goal: Ability to maintain clinical measurements within normal limits will improve Outcome: Progressing Goal: Will remain free from infection Outcome: Progressing Goal: Diagnostic test results will improve Outcome: Progressing Goal: Respiratory complications will improve Outcome: Progressing Goal: Cardiovascular complication will be avoided Outcome: Progressing   Problem: Activity: Goal: Risk for activity intolerance will decrease Outcome: Progressing   Problem: Elimination: Goal: Will not experience complications related to urinary retention Outcome: Progressing   Problem: Education: Goal: Ability to demonstrate management of disease process will improve Outcome: Progressing Goal: Ability to verbalize understanding of medication therapies will improve Outcome: Progressing Goal: Individualized Educational Video(s) Outcome: Progressing   Problem: Activity: Goal: Capacity to carry out activities will improve Outcome: Progressing   Problem: Cardiac: Goal: Ability to achieve and maintain adequate cardiopulmonary perfusion will improve Outcome: Progressing

## 2020-02-02 NOTE — Progress Notes (Signed)
PROGRESS NOTE    [name]  TKP:[id number] DOB: [birth year] DOA: 01/06/2020 PCP: Benito Mccreedy, MD   Brief Narrative: Connie Cumberland Milesis an 59 y.o.female withpast medical history of end-stage renal disease on hemodialysis Monday Wednesday and Friday,  status post aortic and mitral valve replacement on Coumadin who presents with nausea and nonbloody emesis, She was found to have a spontaneous left retroperitoneal bleed. She status post 6 units of packed red blood cells,  2 units of fresh frozen plasma and vitamin K.  Her hemoglobin continued to drop. She underwent  IR guided angiogram and embolization of the bleeding artery 01/27/20. She is on heparin gtt , Her Hb slightly dropped to 7.6 , back to Hb 8.5. remained stable.  Coumadin resumed on 9/10 , remains on heparin until INR therapeutic.   Assessment & Plan:   Principal Problem:   Retroperitoneal hemorrhage Active Problems:   End-stage renal disease on hemodialysis (HCC)   H/O heart valve replacement with mechanical valve   S/P mitral valve replacement with metallic valve   Nontraumatic retroperitoneal hematoma   Status post mechanical aortic valve replacement     Retroperitoneal hemorrhage- mechanical aortic and mitral valve -  this appears to have been a spontaneous hemorrhage. -  s/p 6 U PRBC, Vit K and 2 U FFP.  -  It has resolved and Coumadin was resumed with Heparin bridge on 8/23. - ultrasound of abdomen reveals stable retroperitoneal bleed. - 9/2> CT abd/pelvis shows a stable hematoma. -  patient had recurrent severe left flank pain which she states feels similar to the pain she had when she was found to have the RP hemorrhage -  Dilaudid IV for her pain was ordered and increased her Oxycodone -  Hb dropped to 8  9/3  - reordered a CT scan and her hematoma does appear larger than before thus, consulted IR for an embolization -  per IR, it is difficult to tell if she is still bleeding , Dr Earleen Newport requests that  we decrease her INR to below 2.0 for the angiogram  - She was given Vitamin K 5 mg  -  cont to follow Hemoglobin closely and transfuse if < 7.  -  She underwent angiogram followed by embolization of bleeding artery by IR  01/27/20, tolerated well -  Her hb dropped to 6.8 despite embolization. She received 2 more units PRBC post Hb 8.5 -  Continue heparin gtt, Hb remained stable afterwards, flank pain better,   - Coumadin resumed 9/10 , continue heparin until INR therapeutic. -  Hb stablised after IR embolization. -    H/o : Aortic and mitral valve replacement: She was on Coumadin,  Coumadin was on hold because of retroperitoneal bleed. Now bleeding has stopped,  hemoglobin remained stable,  remains on heparin gtt., Coumadin resumed from 9/10 until INR therapeutic then dc heparin.  Nausea and Vomiting - Improved - patient claims the pain is causing this but it may also be the Dilauidid- continue PRN Zofran - Nausea and vomiting resolved.    Gross hematuria>>> resolved - obtained CT abd./pelvis to see if a source of bleeding can be found but this was unremarkable. - current amount of blood is small. -  Urology consulted if she continues to have this issue when close to d/c,  - She will make follow up appointment for urology to investigate this further after discharge    End-stage renal disease on hemodialysis  Secondary hyperparathyroidism - cont HD on MWF  DVT prophylaxis: Heparin gtt / coumadin resumed Code Status: Full Family Communication: No family at bed side. Disposition Plan:  Dispo: The patient is from: Home  Anticipated d/c is to: Home  Anticipated d/c date is:1-2 days  Patient currently is not medically stable to d/c. subtherapeutic INR.   Consultants:    Nephrology, Cardiology, Urology, IR  Procedures:   Antimicrobials: Anti-infectives (From admission, onward)   None     Subjective: Patient was seen and examined in  the morning today, no overnight events.  She reports feeling much better. She reports flank pain has resolved, denies nausea, vomiting and diarrhea.  INR 1.4  Objective: Vitals:   02/01/20 1200 02/01/20 2057 02/02/20 0543 02/02/20 1239  BP: 122/68 138/86 124/73 132/76  Pulse: 100 94 88 88  Resp: 17 16 19 18   Temp: 98.3 F (36.8 C) 99.2 F (37.3 C) 98.8 F (37.1 C) 99.1 F (37.3 C)  TempSrc: Oral Oral Oral Oral  SpO2:  98% 100%   Weight: 61.3 kg  62 kg   Height:        Intake/Output Summary (Last 24 hours) at 02/02/2020 1444 Last data filed at 02/01/2020 2030 Gross per 24 hour  Intake 320 ml  Output 1 ml  Net 319 ml   Filed Weights   02/01/20 0740 02/01/20 1200 02/02/20 0543  Weight: 64.2 kg 61.3 kg 62 kg    Examination:  General exam: Appears calm and comfortable. Alert, oriented x 3  Respiratory system: Clear to auscultation. Respiratory effort normal. Chest  : HD catheter noted, no sign of infection. Cardiovascular system: S1 & S2 heard, RRR. No JVD, murmurs, rubs, gallops or clicks. No pedal edema. HD port noted.  Gastrointestinal system: Abdomen is nondistended, soft and mildly tender in left flank.  No organomegaly or masses felt. Normal bowel sounds heard. Central nervous system: Alert and oriented. No focal neurological deficits. Extremities:  No leg edema, no cyanosis, no clubbing. Skin: No rashes, lesions or ulcers Psychiatry: Judgement and insight appear normal. Mood & affect appropriate.     Data Reviewed: I have personally reviewed following labs and imaging studies  CBC: Recent Labs  Lab 01/29/20 2131 01/29/20 2131 01/30/20 0144 01/30/20 0144 01/30/20 1224 01/31/20 0607 01/31/20 1930 02/01/20 0502 02/02/20 0444  WBC 9.4  --  9.2  --   --  7.8  --  8.5 8.9  HGB 7.7*   < > 7.6*   < > 8.5* 7.8* 8.2* 7.8* 7.8*  HCT 24.0*   < > 23.8*   < > 26.5* 24.7* 26.0* 24.6* 24.9*  MCV 96.8  --  96.7  --   --  100.0  --  100.8* 102.5*  PLT 234  --  247  --    --  263  --  293 308   < > = values in this interval not displayed.   Basic Metabolic Panel: Recent Labs  Lab 01/28/20 0407 01/28/20 0407 01/29/20 1333 01/30/20 0144 01/31/20 0607 02/01/20 0502 02/02/20 0444  NA 133*   < > 129*  129* 129* 132* 129* 133*  K 3.6   < > 3.7  3.8 3.9 3.6 4.1 3.4*  CL 93*   < > 91*  91* 89* 94* 92* 96*  CO2 28   < > 25  25 25 27 25 28   GLUCOSE 119*   < > 116*  117* 96 99 89 132*  BUN 16   < > 33*  33* 45* 26* 44* 15  CREATININE  4.83*   < > 7.50*  7.42* 8.66* 6.12* 8.43* 4.46*  CALCIUM 8.2*   < > 8.4*  8.4* 8.8* 9.3 9.6 8.7*  MG 1.8  --  1.8  --  1.9  --   --   PHOS 4.4   < > 4.1  4.1 3.9 3.5 3.3 2.4*   < > = values in this interval not displayed.   GFR: Estimated Creatinine Clearance: 13 mL/min (A) (by C-G formula based on SCr of 4.46 mg/dL (H)). Liver Function Tests: Recent Labs  Lab 01/29/20 1333 01/30/20 0144 01/31/20 0607 02/01/20 0502 02/02/20 0444  AST 22  --   --   --   --   ALT 16  --   --   --   --   ALKPHOS 97  --   --   --   --   BILITOT 0.8  --   --   --   --   PROT 6.9  --   --   --   --   ALBUMIN 2.5*  2.5* 2.4* 2.4* 2.4* 2.3*   No results for input(s): LIPASE, AMYLASE in the last 168 hours. No results for input(s): AMMONIA in the last 168 hours. Coagulation Profile: Recent Labs  Lab 01/29/20 1333 01/30/20 0144 01/31/20 0607 02/01/20 0502 02/02/20 0444  INR 1.1 1.1 1.1 1.3* 1.4*   Cardiac Enzymes: No results for input(s): CKTOTAL, CKMB, CKMBINDEX, TROPONINI in the last 168 hours. BNP (last 3 results) No results for input(s): PROBNP in the last 8760 hours. HbA1C: No results for input(s): HGBA1C in the last 72 hours. CBG: No results for input(s): GLUCAP in the last 168 hours. Lipid Profile: No results for input(s): CHOL, HDL, LDLCALC, TRIG, CHOLHDL, LDLDIRECT in the last 72 hours. Thyroid Function Tests: No results for input(s): TSH, T4TOTAL, FREET4, T3FREE, THYROIDAB in the last 72 hours. Anemia  Panel: No results for input(s): VITAMINB12, FOLATE, FERRITIN, TIBC, IRON, RETICCTPCT in the last 72 hours. Sepsis Labs: No results for input(s): PROCALCITON, LATICACIDVEN in the last 168 hours.  No results found for this or any previous visit (from the past 240 hour(s)).   Radiology Studies: No results found.  Scheduled Meds: . sodium chloride   Intravenous Once  . sodium chloride   Intravenous Once  . calcitRIOL  2 mcg Oral Q M,W,F  . cinacalcet  180 mg Oral Q M,W,F-HD  . darbepoetin (ARANESP) injection - DIALYSIS  60 mcg Intravenous Q Mon-HD  . docusate sodium  100 mg Oral Daily  . ferric citrate  420 mg Oral TID WC  . metoprolol tartrate  25 mg Oral BID  . multivitamin  1 tablet Oral QHS  . pantoprazole  40 mg Oral QAC breakfast  . polyethylene glycol  17 g Oral BID  . senna  1 tablet Oral Daily  . simethicone  80 mg Oral TID  . warfarin  9 mg Oral ONCE-1600  . Warfarin - Pharmacist Dosing Inpatient   Does not apply q1600   Continuous Infusions: . sodium chloride    . heparin 1,200 Units/hr (02/01/20 1918)  . potassium PHOSPHATE IVPB (in mmol) 20 mmol (02/02/20 0913)     LOS: 27 days    Time spent: 25 mins.    Shawna Clamp, MD [facility]   If 7PM-7AM, please contact night-coverage

## 2020-02-02 NOTE — Progress Notes (Signed)
Patient is alert oriented complains of ongoing abdominal cramps, pressure to urinate with scant  bleeding when wiping.

## 2020-02-02 NOTE — Progress Notes (Signed)
Physical Therapy Treatment Patient Details Name: [name] MRN: [id number] DOB: [birth year] Today's Date: 02/02/2020    History of Present Illness Patient is a 59 year old female with history of ESRD on HD MWF, s/p aortic and mitral valve replacement on warfarin therapy who presented with nausea, nonbloody emesis and abdominal pain; found to have spontaneous left retroperitoneal bleed.  On 9/8 underwent Left renal angiogram and coil embolization.     PT Comments    Pt admitted with above diagnosis. Pt was able to ambulate without LOB and was able to walk 500 feet today.  She felt better than last 2 days per pt.  Pt has good and bad days. Pt was  fatigued today at end of walk and did not want to perform exercise.  Pt currently with functional limitations due to balance and endurance deficits. Pt will benefit from skilled PT to increase their independence and safety with mobility to allow discharge to the venue listed below.     Follow Up Recommendations  Home health PT;Supervision - Intermittent     Equipment Recommendations  None recommended by PT    Recommendations for Other Services       Precautions / Restrictions Precautions Precautions: Fall Restrictions Weight Bearing Restrictions: No    Mobility  Bed Mobility Overal bed mobility: Modified Independent             General bed mobility comments: HOB elevated  Transfers Overall transfer level: Needs assistance Equipment used: None Transfers: Sit to/from Stand;Stand Pivot Transfers Sit to Stand: Supervision Stand pivot transfers: Supervision       General transfer comment: supervision for lines  Ambulation/Gait Ambulation/Gait assistance: Supervision;Min guard Gait Distance (Feet): 500 Feet Assistive device: None Gait Pattern/deviations: Step-through pattern;Decreased stride length Gait velocity: reduced Gait velocity interpretation: <1.31 ft/sec, indicative of household ambulator General Gait  Details: Pt without LOB. Did not need device. Min guard with min challenges.    Stairs             Wheelchair Mobility    Modified Rankin (Stroke Patients Only)       Balance                                            Cognition Arousal/Alertness: Awake/alert Behavior During Therapy: WFL for tasks assessed/performed Overall Cognitive Status: Within Functional Limits for tasks assessed                                        Exercises Other Exercises Other Exercises: discussed HEP and for her to continue to work on these when she feels like it. She didnt feel like doing them today.     General Comments General comments (skin integrity, edema, etc.): VSS on RA      Pertinent Vitals/Pain Pain Assessment: Faces Faces Pain Scale: Hurts even more Pain Location: left groin Pain Descriptors / Indicators: Grimacing;Guarding Pain Intervention(s): Limited activity within patient's tolerance;Monitored during session;Repositioned    Home Living                      Prior Function            PT Goals (current goals can now be found in the care plan section) Acute Rehab PT Goals Patient Stated Goal:  manage her symptoms Progress towards PT goals: Progressing toward goals    Frequency    Min 3X/week      PT Plan Current plan remains appropriate    Co-evaluation              AM-PAC PT "6 Clicks" Mobility   Outcome Measure  Help needed turning from your back to your side while in a flat bed without using bedrails?: None Help needed moving from lying on your back to sitting on the side of a flat bed without using bedrails?: None Help needed moving to and from a bed to a chair (including a wheelchair)?: None Help needed standing up from a chair using your arms (e.g., wheelchair or bedside chair)?: None Help needed to walk in hospital room?: A Little Help needed climbing 3-5 steps with a railing? : A Little 6 Click  Score: 22    End of Session Equipment Utilized During Treatment: Gait belt Activity Tolerance: Patient tolerated treatment well Patient left: with call bell/phone within reach;in bed (sitting EOB) Nurse Communication: Mobility status PT Visit Diagnosis: Muscle weakness (generalized) (M62.81)     Time: 0263-7858 PT Time Calculation (min) (ACUTE ONLY): 10 min  Charges:  $Gait Training: 8-22 mins                     [name],PT Acute Rehabilitation Services Pager:  [phone]  Office:  [phone]     Denice Paradise 02/02/2020, 10:27 AM

## 2020-02-02 NOTE — Progress Notes (Signed)
[place] for heparin, warfarin Indication: mechanical AVR/MVR  No Known Allergies  Patient Measurements: Height: 5\' 6"  (167.6 cm) Weight: 62 kg (136 lb 9.6 oz) IBW/kg (Calculated) : 59.3 Heparin Dosing Weight: 62kg  Vital Signs: Temp: 98.8 F (37.1 C) (09/14 0543) Temp Source: Oral (09/14 0543) BP: 124/73 (09/14 0543) Pulse Rate: 88 (09/14 0543)  Labs: Recent Labs    01/31/20 0607 01/31/20 0607 01/31/20 1930 02/01/20 0502 02/01/20 1645 02/02/20 0444  HGB 7.8*   < > 8.2* 7.8*  --  7.8*  HCT 24.7*   < > 26.0* 24.6*  --  24.9*  PLT 263  --   --  293  --  308  LABPROT 13.9  --   --  15.4*  --  16.8*  INR 1.1  --   --  1.3*  --  1.4*  HEPARINUNFRC 0.33   < >  --  0.24* 0.31 0.31  CREATININE 6.12*  --   --  8.43*  --  4.46*   < > = values in this interval not displayed.    Estimated Creatinine Clearance: 13 mL/min (A) (by C-G formula based on SCr of 4.46 mg/dL (H)).   Assessment: 59 yo W on warfarin PTA for hx mechanical AVR/MVR (goal 2.5-3.5), reversed with vit K 10 mg IV + 2 units FFP  + 4 units PRBcs on 8/18 and another vit K po 5mg  on 9/7 with 2 unit RBCs 9/8-9/9 due to continued retroperitoneal bleed.  PTA Warfarin regimen: 9 mg daily except 6 mg on Mondays - last 8/18 (INR was 3.6 on admit 8/18)    Heparin level therapeutic s/p rate increase to 1200 units/hr, INR subtherapeutic continues to uptrend to 1.4.    Goal of Therapy:  Heparin level 0.3 -0.5 units/ml (low goal due to bleed) INR 2.5- 3.5  Monitor platelets by anticoagulation protocol: Yes   Plan:  Continue heparin gtt at 1200 units/hr Warfarin 9mg  PO x 1 today Daily INR, heparin level, CBC, s/s bleeding  Bertis Ruddy, PharmD Clinical Pharmacist Please check AMION for all [place] numbers 02/02/2020 7:09 AM

## 2020-02-03 DIAGNOSIS — Z954 Presence of other heart-valve replacement: Secondary | ICD-10-CM

## 2020-02-03 LAB — RENAL FUNCTION PANEL
Albumin: 2.4 g/dL — ABNORMAL LOW (ref 3.5–5.0)
Anion gap: 11 (ref 5–15)
BUN: 29 mg/dL — ABNORMAL HIGH (ref 6–20)
CO2: 25 mmol/L (ref 22–32)
Calcium: 9.1 mg/dL (ref 8.9–10.3)
Chloride: 94 mmol/L — ABNORMAL LOW (ref 98–111)
Creatinine, Ser: 6.51 mg/dL — ABNORMAL HIGH (ref 0.44–1.00)
GFR calc Af Amer: 8 mL/min — ABNORMAL LOW (ref >60)
GFR calc non Af Amer: 6 mL/min — ABNORMAL LOW (ref >60)
Glucose, Bld: 91 mg/dL (ref 70–99)
Phosphorus: 3.7 mg/dL (ref 2.5–4.6)
Potassium: 3.8 mmol/L (ref 3.5–5.1)
Sodium: 130 mmol/L — ABNORMAL LOW (ref 135–145)

## 2020-02-03 LAB — PROTIME-INR
INR: 1.7 — ABNORMAL HIGH (ref 0.8–1.2)
Prothrombin Time: 19.6 seconds — ABNORMAL HIGH (ref 11.4–15.2)

## 2020-02-03 LAB — CBC
HCT: 25.7 % — ABNORMAL LOW (ref 36.0–46.0)
Hemoglobin: 8.1 g/dL — ABNORMAL LOW (ref 12.0–15.0)
MCH: 31.5 pg (ref 26.0–34.0)
MCHC: 31.5 g/dL (ref 30.0–36.0)
MCV: 100 fL (ref 80.0–100.0)
Platelets: 340 10*3/uL (ref 150–400)
RBC: 2.57 MIL/uL — ABNORMAL LOW (ref 3.87–5.11)
RDW: 16.4 % — ABNORMAL HIGH (ref 11.5–15.5)
WBC: 10 10*3/uL (ref 4.0–10.5)
nRBC: 1.1 % — ABNORMAL HIGH (ref 0.0–0.2)

## 2020-02-03 LAB — HEPARIN LEVEL (UNFRACTIONATED): Heparin Unfractionated: 0.36 IU/mL (ref 0.30–0.70)

## 2020-02-03 MED ORDER — CHLORHEXIDINE GLUCONATE CLOTH 2 % EX PADS
6.0000 | MEDICATED_PAD | Freq: Every day | CUTANEOUS | Status: DC
  Administered 2020-02-03 – 2020-02-12 (×5): 6 via TOPICAL

## 2020-02-03 MED ORDER — WARFARIN SODIUM 3 MG PO TABS
9.0000 mg | ORAL_TABLET | Freq: Once | ORAL | Status: AC
  Administered 2020-02-03: 9 mg via ORAL
  Filled 2020-02-03: qty 3
  Filled 2020-02-03: qty 1

## 2020-02-03 NOTE — Assessment & Plan Note (Signed)
--  Continue management per nephrology

## 2020-02-03 NOTE — Assessment & Plan Note (Signed)
--  Continue heparin infusion until INR therapeutic. Continue warfarin per pharmacy.

## 2020-02-03 NOTE — Progress Notes (Signed)
Pt states that she has "dripped blood from her urinary tract" (although she does not make urine) when she has had bowel movements over the last 2 days.

## 2020-02-03 NOTE — Assessment & Plan Note (Addendum)
--  spontaneous --Treated with 6 units PRBC, vitamin K and 2 units FFP --s/p embolization. --Hemoglobin remains stable. --Monitor clinically.

## 2020-02-03 NOTE — Progress Notes (Signed)
PROGRESS NOTE  [name] JGO:[id number] DOB: 1960-11-06 DOA: 01/06/2020 PCP: Benito Mccreedy, MD  Brief History   59 year old woman PMH including ESRD, status post aortic and mitral valve replacement on warfarin, presented with spontaneous left retroperitoneal bleed, requiring total 6 units PRBCs, 2 units FFP and vitamin K. Status post IR guided angiogram and embolization bleeding artery 9/8. Hemoglobin is stabilized. Warfarin resumed. Remains on heparin infusion until INR therapeutic then anticipate discharge home.   A & P  Retroperitoneal hemorrhage --Treated with blood products and embolization. --Hemoglobin stable --Monitor clinically.  End-stage renal disease on hemodialysis [facility]) --Continue management per nephrology.  S/P mitral valve replacement with metallic valve --Continue heparin infusion until INR therapeutic. Continue warfarin per pharmacy.  Status post mechanical aortic valve replacement --Continue heparin infusion until INR therapeutic. Continue warfarin per pharmacy.  Disposition Plan:  Discussion: Hemoglobin stable. Continue heparin infusion until INR therapeutic then anticipate discharge home.  Status is: Inpatient  Remains inpatient appropriate because:IV treatments appropriate due to intensity of illness or inability to take PO   Dispo: The patient is from: Home              Anticipated d/c is to: Home              Anticipated d/c date is: 3 days              Patient currently is not medically stable to d/c.  DVT prophylaxis: heparin infusion SCDs Start: 01/06/20 1944 Code Status: Full Family Communication: none  [name], MD  [facility] Direct contact: see [email or web address] (further directions at bottom of note if needed) 7PM-7AM contact night coverage as at bottom of note 02/03/2020, 6:30 PM  LOS: 28 days   Significant Hospital Events   .    Consults:  .    Procedures:  .   Significant Diagnostic Tests:  [name]    Micro  Data:  .    Antimicrobials:  .   Interval History/Subjective  Feels fine.  Objective   Vitals:  Vitals:   02/03/20 1135 02/03/20 1210  BP: 120/68 121/69  Pulse: 97 (!) 102  Resp: 18 20  Temp: 97.8 F (36.6 C) 98.7 F (37.1 C)  SpO2: 98% 100%    Exam:  Constitutional:   . Appears calm and comfortable Respiratory:  . CTA bilaterally, no w/r/r.  . Respiratory effort normal.  Cardiovascular:  . RRR, no m/r/g . No LE extremity edema   Psychiatric:  . Mental status o Mood, affect appropriate  I have personally reviewed the following:   Today's Data  . BMP noted, K+ 3.8 . Hgb stable 8.1 . INR up to 1.7  Scheduled Meds: . sodium chloride   Intravenous Once  . sodium chloride   Intravenous Once  . calcitRIOL  2 mcg Oral Q M,W,F  . Chlorhexidine Gluconate Cloth  6 each Topical Q0600  . cinacalcet  180 mg Oral Q M,W,F-HD  . darbepoetin (ARANESP) injection - DIALYSIS  60 mcg Intravenous Q Mon-HD  . docusate sodium  100 mg Oral Daily  . ferric citrate  420 mg Oral TID WC  . metoprolol tartrate  25 mg Oral BID  . multivitamin  1 tablet Oral QHS  . pantoprazole  40 mg Oral QAC breakfast  . polyethylene glycol  17 g Oral BID  . senna  1 tablet Oral Daily  . simethicone  80 mg Oral TID  . Warfarin - Pharmacist Dosing Inpatient   Does not apply [id number]  Continuous Infusions: . sodium chloride    . heparin 1,200 Units/hr (02/03/20 1302)    Principal Problem:   Retroperitoneal hemorrhage Active Problems:   End-stage renal disease on hemodialysis (HCC)   S/P mitral valve replacement with metallic valve   Status post mechanical aortic valve replacement   Nontraumatic retroperitoneal hematoma   LOS: 28 days   How to contact the [facility] Attending or Consulting provider [place] or covering provider during after hours [place], for this patient?  1. Check the care team in [facility] and look for a) attending/consulting [facility] provider listed and b) the [facility] team listed 2. Log into  [email or web address] and use [place]'s universal password to access. If you do not have the password, please contact the hospital operator. 3. Locate the [facility] provider you are looking for under [facility] and page to a number that you can be directly reached. 4. If you still have difficulty reaching the provider, please page the [facility] (Director on Call) for the Hospitalists listed on amion for assistance.

## 2020-02-03 NOTE — Progress Notes (Signed)
Nephrology Follow-Up Consult note   Assessment/Recommendations: [name] is a/an 59 y.o. female with a past medical history significant for ESRD, aortic and mitral valve replacement on Coumadin, admitted for retroperitoneal bleed.     OP HD:MWF  4h 61kg 450/800 2/2 bath P2 Hep 1800 -sensipar 180mg  -calcitriol 52mcg -no esa, no iron   1.Lretroperitonealperirenal bleed/hematoma - Bleed seen on CT 8/18.  Status post multiple blood products requiring IV heparin for bridge given history of valve replacement.  Now status post renal embolization with VIR.  Hemoglobin has stabilized - coumadin restarted 9/10, continues on IV heparin, INR rising appropriately 3.Hx AVR/MVR-anticoagulation as above 4. ESRD- on HD MWF. HD per schedule.  Tolerating well today. 5. Anemiaof CKD- As above S/p 6 units pRBC. TSAT 84% 8/30.  Aranesp 31mcg qweekly started on 9/13.  6. Secondary hyperparathyroidism- Phos at goal. Continue calcitriol and sensipar. Noted on Auryxia 840mg  at home decreased to 420mg . CTM 7.HTN/volume- BP stable, [name] decreased to 60.5 due to hyponatremia 8. Nutrition- Renal diet w/fluid restrictions. Alb 2.4. Protein supplements.     Recommendations conveyed to primary service.    [facility] 02/03/2020 10:03 AM  ___________________________________________________________  CC: ESRD  Interval History/Subjective: Patient seen on dialysis tolerating well without significant complaints.  Hemoglobin stable at 8.1.  INR increased to 1.7.  Sodium remains low at 130.   Medications:  Current Facility-Administered Medications  Medication Dose Route Frequency Provider Last Rate Last Admin  . 0.9 %  sodium chloride infusion (Manually program via Guardrails IV Fluids)   Intravenous Once Zierle-Ghosh, Asia B, DO      . 0.9 %  sodium chloride infusion (Manually program via Guardrails IV Fluids)   Intravenous Once Shawna Clamp,  MD      . 0.9 %  sodium chloride infusion   Intravenous PRN Charlynne Cousins, MD      . acetaminophen (TYLENOL) tablet 650 mg  650 mg Oral Q6H PRN Charlynne Cousins, MD   650 mg at 02/01/20 2106  . calcitRIOL (ROCALTROL) capsule 2 mcg  2 mcg Oral Q M,W,F Gean Quint, MD   2 mcg at 02/01/20 [phone]  . Chlorhexidine Gluconate Cloth 2 % PADS 6 each  6 each Topical Q0600 Samuella Cota, MD   6 each at 02/03/20 0703  . cinacalcet (SENSIPAR) tablet 180 mg  180 mg Oral Q M,W,F-HD Penninger, Lindsay, PA   180 mg at 02/01/20 1244  . Darbepoetin Alfa (ARANESP) injection 60 mcg  60 mcg Intravenous Q Mon-HD Reesa Chew, MD   60 mcg at 02/01/20 1038  . docusate sodium (COLACE) capsule 100 mg  100 mg Oral Daily Bowser, Laurel Dimmer, NP   100 mg at 02/02/20 0837  . ferric citrate (AURYXIA) tablet 420 mg  420 mg Oral TID WC Ernest Haber, PA-C   420 mg at 02/02/20 0836  . heparin ADULT infusion 100 units/mL (25000 units/223mL sodium chloride 0.45%)  1,200 Units/hr Intravenous Continuous Bertis Ruddy, RPH 12 mL/hr at 02/02/20 1540 1,200 Units/hr at 02/02/20 1540  . hydrALAZINE (APRESOLINE) injection 10 mg  10 mg Intravenous Q6H PRN Debbe Odea, MD   10 mg at 01/24/20 1153  . hydrocortisone cream 1 %   Topical QID PRN Shalhoub, Sherryll Burger, MD   Given at 02/01/20 0230  . iohexol (OMNIPAQUE) 300 MG/ML solution 150 mL  150 mL Intra-arterial Once PRN Jacqulynn Cadet, MD      . metoprolol tartrate (LOPRESSOR) tablet 25 mg  25  mg Oral BID Fay Records, MD   25 mg at 02/02/20 2214  . morphine 2 MG/ML injection 2-4 mg  2-4 mg Intravenous Q3H PRN Debbe Odea, MD   4 mg at 01/28/20 0859  . multivitamin (RENA-VIT) tablet 1 tablet  1 tablet Oral QHS Roney Jaffe, MD   1 tablet at 02/02/20 2214  . ondansetron (ZOFRAN) injection 4 mg  4 mg Intravenous Q6H PRN Debbe Odea, MD   4 mg at 02/01/20 1507  . ondansetron (ZOFRAN-ODT) disintegrating tablet 4 mg  4 mg Oral Q8H PRN Icard, Bradley L, DO   4 mg at  01/18/20 1818  . oxyCODONE (Oxy IR/ROXICODONE) immediate release tablet 10 mg  10 mg Oral Q4H PRN Debbe Odea, MD   10 mg at 02/02/20 2214  . pantoprazole (PROTONIX) EC tablet 40 mg  40 mg Oral QAC breakfast Donnamae Jude, RPH   40 mg at 02/02/20 0836  . polyethylene glycol (MIRALAX / GLYCOLAX) packet 17 g  17 g Oral BID Charlynne Cousins, MD   17 g at 02/02/20 2956  . senna (SENOKOT) tablet 8.6 mg  1 tablet Oral Daily Bowser, Grace E, NP   8.6 mg at 02/02/20 0837  . simethicone (MYLICON) chewable tablet 80 mg  80 mg Oral TID Cristal Generous, NP   80 mg at 02/02/20 2214  . warfarin (COUMADIN) tablet 9 mg  9 mg Oral ONCE-1600 Carney, Gay Filler, RPH      . Warfarin - Pharmacist Dosing Inpatient   Does not apply q1600 Karren Cobble [facility]   Given at 01/30/20 1721      Review of Systems: 10 systems reviewed and negative except per interval history/subjective  Physical Exam: Vitals:   02/03/20 0900 02/03/20 0930  BP: (!) 161/86 (!) 141/63  Pulse: 81 98  Resp: 18 (!) 21  Temp:    SpO2:     No intake/output data recorded.  Intake/Output Summary (Last 24 hours) at 02/03/2020 1003 Last data filed at 02/02/2020 1900 Gross per 24 hour  Intake 746 ml  Output --  Net 746 ml   Constitutional: well-appearing, no acute distress ENMT: ears and nose without scars or lesions, MMM CV: normal rate, no edema Respiratory: bilateral chest rise, normal work of breathing Gastrointestinal: soft, NABS Skin: no visible lesions or rashes Psych: alert, judgement/insight appropriate, appropriate mood and affect   Test Results I personally reviewed new and old clinical labs and radiology tests Lab Results  Component Value Date   NA 130 (L) 02/03/2020   K 3.8 02/03/2020   CL 94 (L) 02/03/2020   CO2 25 02/03/2020   BUN 29 (H) 02/03/2020   CREATININE 6.51 (H) 02/03/2020   CALCIUM 9.1 02/03/2020   ALBUMIN 2.4 (L) 02/03/2020   PHOS 3.7 02/03/2020

## 2020-02-03 NOTE — Progress Notes (Signed)
[place] for heparin, warfarin Indication: mechanical AVR/MVR  No Known Allergies  Patient Measurements: Height: 5\' 6"  (167.6 cm) Weight: 64.2 kg (141 lb 8.6 oz) IBW/kg (Calculated) : 59.3 Heparin Dosing Weight: 62kg  Vital Signs: Temp: 97.8 F (36.6 C) (09/15 0725) Temp Source: Oral (09/15 0725) BP: 161/86 (09/15 0900) Pulse Rate: 81 (09/15 0900)  Labs: Recent Labs    02/01/20 0502 02/01/20 0502 02/01/20 1645 02/02/20 0444 02/03/20 0744 02/03/20 0745  HGB 7.8*   < >  --  7.8* 8.1*  --   HCT 24.6*  --   --  24.9* 25.7*  --   PLT 293  --   --  308 340  --   LABPROT 15.4*  --   --  16.8* 19.6*  --   INR 1.3*  --   --  1.4* 1.7*  --   HEPARINUNFRC 0.24*   < > 0.31 0.31  --  0.36  CREATININE 8.43*  --   --  4.46* 6.51*  --    < > = values in this interval not displayed.    Estimated Creatinine Clearance: 8.9 mL/min (A) (by C-G formula based on SCr of 6.51 mg/dL (H)).   Assessment: 59 yo W on warfarin PTA for hx mechanical AVR/MVR (goal 2.5-3.5), reversed with vit K 10 mg IV + 2 units FFP  + 4 units PRBcs on 8/18 and another vit K po 5mg  on 9/7 with 2 unit RBCs 9/8-9/9 due to continued retroperitoneal bleed.  PTA Warfarin regimen: 9 mg daily except 6 mg on Mondays - last 8/18 (INR was 3.6 on admit 8/18)    Heparin level therapeutic on IV heparin at 1200 units/hr, INR subtherapeutic continues to uptrend to 1.7.  Hgb stable today at 8.1.  Scant blood with urination per nursing notes.  Goal of Therapy:  Heparin level 0.3 -0.5 units/ml (low goal due to bleed) INR 2.5- 3.5  Monitor platelets by anticoagulation protocol: Yes   Plan:  Continue heparin gtt at 1200 units/hr Warfarin 9 mg PO again today Daily INR, heparin level, CBC, s/s bleeding  [place] Crane, Roylene Reason, [facility] Clinical Pharmacist  02/03/2020 9:16 AM   [facility] pharmacy phone numbers are listed on [email or web address]

## 2020-02-03 NOTE — Hospital Course (Addendum)
59 year old woman PMH including ESRD, status post aortic and mitral valve replacement on warfarin, presented with flank pain; CT revealed large spontaneous left retroperitoneal hematoma, INR was 3.6. seen by surgery, cardiology, PCCM, nephrology and urology. Required total 6 units PRBCs, 2 units FFP and vitamin K. About 10 days in pt developed gross hematuria, imaging nondiagnostic for renal mass. Attending discussed w/ urology; given negative imaging rec was to followup as outpat w/ urology to further investigate. Developed flank pain about 2 weeks in, CT showed enlarging hematoma associate w/ left kidney. Underwent left renal angiogram and coil embolization. S/p post IR guided angiogram and embolization bleeding artery 9/8 patient stablized. Hemoglobin is stabilized. Warfarin resumed. Remains on heparin infusion until INR therapeutic then anticipate discharge home.

## 2020-02-03 NOTE — Assessment & Plan Note (Addendum)
--  Continue heparin infusion until INR therapeutic. Goal 2.5-3.5. Continue warfarin per pharmacy. --f/u with warfarin clinic

## 2020-02-04 DIAGNOSIS — N368 Other specified disorders of urethra: Secondary | ICD-10-CM

## 2020-02-04 LAB — RENAL FUNCTION PANEL
Albumin: 2.5 g/dL — ABNORMAL LOW (ref 3.5–5.0)
Anion gap: 10 (ref 5–15)
BUN: 21 mg/dL — ABNORMAL HIGH (ref 6–20)
CO2: 27 mmol/L (ref 22–32)
Calcium: 9.3 mg/dL (ref 8.9–10.3)
Chloride: 95 mmol/L — ABNORMAL LOW (ref 98–111)
Creatinine, Ser: 4.7 mg/dL — ABNORMAL HIGH (ref 0.44–1.00)
GFR calc Af Amer: 11 mL/min — ABNORMAL LOW (ref >60)
GFR calc non Af Amer: 10 mL/min — ABNORMAL LOW (ref >60)
Glucose, Bld: 100 mg/dL — ABNORMAL HIGH (ref 70–99)
Phosphorus: 3 mg/dL (ref 2.5–4.6)
Potassium: 4.5 mmol/L (ref 3.5–5.1)
Sodium: 132 mmol/L — ABNORMAL LOW (ref 135–145)

## 2020-02-04 LAB — CBC
HCT: 27.2 % — ABNORMAL LOW (ref 36.0–46.0)
Hemoglobin: 8.5 g/dL — ABNORMAL LOW (ref 12.0–15.0)
MCH: 32.4 pg (ref 26.0–34.0)
MCHC: 31.3 g/dL (ref 30.0–36.0)
MCV: 103.8 fL — ABNORMAL HIGH (ref 80.0–100.0)
Platelets: 339 10*3/uL (ref 150–400)
RBC: 2.62 MIL/uL — ABNORMAL LOW (ref 3.87–5.11)
RDW: 16.9 % — ABNORMAL HIGH (ref 11.5–15.5)
WBC: 10 10*3/uL (ref 4.0–10.5)
nRBC: 1.7 % — ABNORMAL HIGH (ref 0.0–0.2)

## 2020-02-04 LAB — HEPARIN LEVEL (UNFRACTIONATED): Heparin Unfractionated: 0.37 IU/mL (ref 0.30–0.70)

## 2020-02-04 LAB — PROTIME-INR
INR: 1.9 — ABNORMAL HIGH (ref 0.8–1.2)
Prothrombin Time: 20.7 seconds — ABNORMAL HIGH (ref 11.4–15.2)

## 2020-02-04 MED ORDER — WARFARIN SODIUM 3 MG PO TABS
9.0000 mg | ORAL_TABLET | Freq: Once | ORAL | Status: AC
  Administered 2020-02-04: 9 mg via ORAL
  Filled 2020-02-04: qty 3

## 2020-02-04 NOTE — Progress Notes (Signed)
Physical Therapy Treatment Patient Details Name: [name] MRN: [id number] DOB: [birth year] Today's Date: 02/04/2020    History of Present Illness Patient is a 59 year old female with history of ESRD on HD MWF, s/p aortic and mitral valve replacement on warfarin therapy who presented with nausea, nonbloody emesis and abdominal pain; found to have spontaneous left retroperitoneal bleed.  On 9/8 underwent Left renal angiogram and coil embolization.     PT Comments    Pt admitted with above diagnosis. Pt progressing.  Fatigues quickly as she could not complete entire HEP due to pt reports sweating after doing a little over half of exercises.  Needs continued PT.  Pt currently with functional limitations due to balance and endurance deficits. Pt will benefit from skilled PT to increase their independence and safety with mobility to allow discharge to the venue listed below.    Follow Up Recommendations  Home health PT;Supervision - Intermittent     Equipment Recommendations  None recommended by PT    Recommendations for Other Services       Precautions / Restrictions Precautions Precautions: Fall Restrictions Weight Bearing Restrictions: No    Mobility  Bed Mobility Overal bed mobility: Modified Independent             General bed mobility comments: HOB elevated  Transfers Overall transfer level: Needs assistance Equipment used: None Transfers: Sit to/from Stand;Stand Pivot Transfers Sit to Stand: Supervision Stand pivot transfers: Supervision       General transfer comment: supervision for lines  Ambulation/Gait Ambulation/Gait assistance: Supervision;Min guard Gait Distance (Feet): 550 Feet Assistive device: None Gait Pattern/deviations: Step-through pattern;Decreased stride length Gait velocity: reduced Gait velocity interpretation: <1.31 ft/sec, indicative of household ambulator General Gait Details: Pt without LOB. Did not need device. Min guard with  min challenges.    Stairs             Wheelchair Mobility    Modified Rankin (Stroke Patients Only)       Balance Overall balance assessment: No apparent balance deficits (not formally assessed)                                          Cognition Arousal/Alertness: Awake/alert Behavior During Therapy: WFL for tasks assessed/performed Overall Cognitive Status: Within Functional Limits for tasks assessed                                        Exercises General Exercises - Lower Extremity Hip ABduction/ADduction: 10 reps;Standing;AROM;Both;Strengthening Hip Flexion/Marching: AROM;Both;10 reps;Standing;Strengthening Other Exercises Other Exercises: Hip extension, knee extension,and  knee flexion in standing x 10 reps.  Pt fatigued and didnt do any other exercises today.     General Comments General comments (skin integrity, edema, etc.): VSS on RA      Pertinent Vitals/Pain Pain Assessment: Faces Faces Pain Scale: Hurts even more Pain Location: left groin Pain Descriptors / Indicators: Grimacing;Guarding Pain Intervention(s): Limited activity within patient's tolerance;Monitored during session;Repositioned    Home Living                      Prior Function            PT Goals (current goals can now be found in the care plan section) Acute Rehab PT Goals Patient Stated Goal:  manage her symptoms Progress towards PT goals: Progressing toward goals    Frequency    Min 3X/week      PT Plan Current plan remains appropriate    Co-evaluation              AM-PAC PT "6 Clicks" Mobility   Outcome Measure  Help needed turning from your back to your side while in a flat bed without using bedrails?: None Help needed moving from lying on your back to sitting on the side of a flat bed without using bedrails?: None Help needed moving to and from a bed to a chair (including a wheelchair)?: None Help needed standing  up from a chair using your arms (e.g., wheelchair or bedside chair)?: None Help needed to walk in hospital room?: A Little Help needed climbing 3-5 steps with a railing? : A Little 6 Click Score: 22    End of Session Equipment Utilized During Treatment: Gait belt Activity Tolerance: Patient tolerated treatment well Patient left: with call bell/phone within reach;in bed (sitting EOB) Nurse Communication: Mobility status PT Visit Diagnosis: Muscle weakness (generalized) (M62.81)     Time: 0940-1006 PT Time Calculation (min) (ACUTE ONLY): 26 min  Charges:  $Gait Training: 8-22 mins $Therapeutic Exercise: 8-22 mins                     Morse Brueggemann W,PT Acute Rehabilitation Services Pager:  [phone]  Office:  [phone]     Denice Paradise 02/04/2020, 12:36 PM

## 2020-02-04 NOTE — Progress Notes (Signed)
Pt stated that she had few drops of blood in her urine "pateint is anuric". Patient informed MD Sarajane Jews per MD and pateint will monitor and consult urology in the morning.

## 2020-02-04 NOTE — Assessment & Plan Note (Addendum)
--  minimal w/ no change in CBC --likely old blood; minimal w/ stable Hgb, expect will spontaneously resolve --can f/u w/ urology as outpt if needed

## 2020-02-04 NOTE — Progress Notes (Signed)
[place] for heparin, warfarin Indication: mechanical AVR/MVR  No Known Allergies  Patient Measurements: Height: 5\' 6"  (167.6 cm) Weight: 61.6 kg (135 lb 12.9 oz) (scale A) IBW/kg (Calculated) : 59.3 Heparin Dosing Weight: 62kg  Vital Signs: Temp: 98.8 F (37.1 C) (09/16 0455) Temp Source: Oral (09/16 0455) BP: 136/78 (09/16 0808) Pulse Rate: 89 (09/16 0808)  Labs: Recent Labs    02/02/20 0444 02/02/20 0444 02/03/20 0744 02/03/20 0745 02/04/20 0921  HGB 7.8*   < > 8.1*  --  8.5*  HCT 24.9*  --  25.7*  --  27.2*  PLT 308  --  340  --  339  LABPROT 16.8*  --  19.6*  --  20.7*  INR 1.4*  --  1.7*  --  1.9*  HEPARINUNFRC 0.31  --   --  0.36 0.37  CREATININE 4.46*  --  6.51*  --  4.70*   < > = values in this interval not displayed.    Estimated Creatinine Clearance: 12.4 mL/min (A) (by C-G formula based on SCr of 4.7 mg/dL (H)).   Assessment: 59 yo W on warfarin PTA for hx mechanical AVR/MVR (goal 2.5-3.5), reversed with vit K 10 mg IV + 2 units FFP  + 4 units PRBcs on 8/18 and another vit K po 5mg  on 9/7 with 2 unit RBCs 9/8-9/9 due to continued retroperitoneal bleed.  PTA Warfarin regimen: 9 mg daily except 6 mg on Mondays - last 8/18 (INR was 3.6 on admit 8/18)    Heparin level remains therapeutic at 0.37 on 1200 units/hr, INR subtherapeutic trending up to 1.9.  Goal of Therapy:  Heparin level 0.3 -0.5 units/ml (low goal due to bleed) INR 2.5- 3.5  Monitor platelets by anticoagulation protocol: Yes   Plan:  Continue heparin gtt at 1200 units/hr Warfarin 9 mg PO x 1 today Daily heparin level, INR, CBC, s/s bleeding  Bertis Ruddy, PharmD Clinical Pharmacist Please check AMION for all [place] numbers 02/04/2020 10:33 AM

## 2020-02-04 NOTE — Progress Notes (Addendum)
PROGRESS NOTE  [name] GYK:[id number] DOB: Jan 17, 1961 DOA: 01/06/2020 PCP: Benito Mccreedy, MD  Brief History   59 year old woman PMH including ESRD, status post aortic and mitral valve replacement on warfarin, presented with spontaneous left retroperitoneal bleed, requiring total 6 units PRBCs, 2 units FFP and vitamin K. Status post IR guided angiogram and embolization bleeding artery 9/8. Hemoglobin is stabilized. Warfarin resumed. Remains on heparin infusion until INR therapeutic then anticipate discharge home.   A & P  Retroperitoneal hemorrhage --Treated with blood products and embolization. --Hemoglobin stable --Monitor clinically.  End-stage renal disease on hemodialysis [facility]) --Continue management per nephrology.  S/P mitral valve replacement with metallic valve --Continue heparin infusion until INR therapeutic. Continue warfarin per pharmacy.  Status post mechanical aortic valve replacement --Continue heparin infusion until INR therapeutic. Continue warfarin per pharmacy.  Urethral bleeding --minimal w/ no change in CBC --sig unclear, suspect iatrogenic --monitor, if continues will ask urology for input  Disposition Plan:  Discussion: Hemoglobin stable. Continue heparin infusion until INR therapeutic then anticipate discharge home.  Status is: Inpatient  Remains inpatient appropriate because:IV treatments appropriate due to intensity of illness or inability to take PO   Dispo: The patient is from: Home              Anticipated d/c is to: Home              Anticipated d/c date is: 3 days              Patient currently is not medically stable to d/c.  DVT prophylaxis: heparin infusion SCDs Start: 01/06/20 1944 Code Status: Full Family Communication: none  [name], MD  [facility] Direct contact: see [email or web address] (further directions at bottom of note if needed) 7PM-7AM contact night coverage as at bottom of note 02/04/2020, 6:58 PM  LOS:  29 days   Significant Hospital Events       Consults:      Procedures:     Significant Diagnostic Tests:      Micro Data:      Antimicrobials:     Interval History/Subjective  Feeling ok Reports has had some spotting from urethra when urinating for about 2 weeks now. No vaginal or rectal bleeding.  Objective   Vitals:  Vitals:   02/04/20 0808 02/04/20 1117  BP: 136/78 (!) 154/84  Pulse: 89 84  Resp:  18  Temp:  98.9 F (37.2 C)  SpO2: 99% 100%    Exam:  Constitutional:    Appears calm and comfortable Respiratory:   CTA bilaterally, no w/r/r.   Respiratory effort normal.  Cardiovascular:   RRR, 3/6 holosystolic murmur RUSB, mechanical systolic and diastolic clicks  No LE extremity edema   Psychiatric:   Mental status o Mood, affect appropriate  I have personally reviewed the following:   Today's Data   BMP noted  Hgb stable 8.5  INR up to 1.9  Scheduled Meds:  sodium chloride   Intravenous Once   sodium chloride   Intravenous Once   calcitRIOL  2 mcg Oral Q M,W,F   Chlorhexidine Gluconate Cloth  6 each Topical Q0600   cinacalcet  180 mg Oral Q M,W,F-HD   darbepoetin (ARANESP) injection - DIALYSIS  60 mcg Intravenous Q Mon-HD   docusate sodium  100 mg Oral Daily   ferric citrate  420 mg Oral TID WC   metoprolol tartrate  25 mg Oral BID   multivitamin  1 tablet Oral QHS   pantoprazole  40 mg  Oral QAC breakfast   polyethylene glycol  17 g Oral BID   senna  1 tablet Oral Daily   simethicone  80 mg Oral TID   Warfarin - Pharmacist Dosing Inpatient   Does not apply q1600   Continuous Infusions:  sodium chloride     heparin 1,200 Units/hr (02/04/20 1124)    Principal Problem:   Retroperitoneal hemorrhage Active Problems:   End-stage renal disease on hemodialysis (HCC)   S/P mitral valve replacement with metallic valve   Status post mechanical aortic valve replacement   Nontraumatic retroperitoneal hematoma    Urethral bleeding   LOS: 29 days   How to contact the [facility] Attending or Consulting provider [place] or covering provider during after hours [place], for this patient?  1. Check the care team in [facility] and look for a) attending/consulting TR[facility] provider listed and b) the [facility] team listed 2. Log into [email or web address] and use [place]'s universal password to access. If you do not have the password, please contact the hospital operator. 3. Locate the [facility] provider you are looking for under [facility] and page to a number that you can be directly reached. 4. If you still have difficulty reaching the provider, please page the [facility] (Director on Call) for the Hospitalists listed on amion for assistance.

## 2020-02-04 NOTE — Progress Notes (Signed)
Orders received to d/c cardiac monitoring. Tele removed. CCMD made aware. Will continue to monitor.

## 2020-02-04 NOTE — Progress Notes (Signed)
Nephrology Follow-Up Consult note   Assessment/Recommendations: [name] is a/an 59 y.o. female with a past medical history significant for ESRD, aortic and mitral valve replacement on Coumadin, admitted for retroperitoneal bleed.     OP HD:MWF  4h 61kg 450/800 2/2 bath P2 Hep 1800 -sensipar 180mg  -calcitriol 50mcg -no esa, no iron   1.Lretroperitonealperirenal bleed/hematoma - Bleed seen on CT 8/18.  Status post multiple blood products requiring IV heparin for bridge given history of valve replacement.  Now status post renal embolization with VIR.  Hemoglobin has stabilized - coumadin restarted 9/10, continues on IV heparin, INR rising appropriately, near goal 3.Hx AVR/MVR-anticoagulation as above 4. ESRD- on HD MWF. HD per schedule.  Tolerating well today. 5. Anemiaof CKD- As above S/p 6 units pRBC. TSAT 84% 8/30.  Aranesp 75mcg qweekly started on 9/13.  6. Secondary hyperparathyroidism- Phos at goal. Continue calcitriol and sensipar. Noted on Auryxia 840mg  at home decreased to 420mg . CTM 7.HTN/volume- BP stable but slightly elevated, [name] decreased to 60.5 due to hyponatremia, did not achieve dry weight on 9/15.  Will attempt to get to [name] as able 8. Nutrition- Renal diet w/fluid restrictions. Alb 2.5. Protein supplements.     Recommendations conveyed to primary service.    [facility] 02/04/2020 10:43 AM  ___________________________________________________________  CC: ESRD  Interval History/Subjective: Patient feels relatively well today.  Continues to have some intermittent vaginal bleeding which is new since 2 days ago.  Blood count stable with hemoglobin of 8.5.  Tolerated dialysis yesterday with no issues.  Denies significant shortness of breath or chest pain.   Medications:  Current Facility-Administered Medications  Medication Dose Route Frequency Provider Last Rate Last Admin  . 0.9 %  sodium  chloride infusion (Manually program via Guardrails IV Fluids)   Intravenous Once Zierle-Ghosh, Asia B, DO      . 0.9 %  sodium chloride infusion (Manually program via Guardrails IV Fluids)   Intravenous Once Shawna Clamp, MD      . 0.9 %  sodium chloride infusion   Intravenous PRN Charlynne Cousins, MD      . acetaminophen (TYLENOL) tablet 650 mg  650 mg Oral Q6H PRN Charlynne Cousins, MD   650 mg at 02/03/20 1256  . calcitRIOL (ROCALTROL) capsule 2 mcg  2 mcg Oral Q M,W,F Gean Quint, MD   2 mcg at 02/03/20 1242  . Chlorhexidine Gluconate Cloth 2 % PADS 6 each  6 each Topical Q0600 Samuella Cota, MD   6 each at 02/03/20 0703  . cinacalcet (SENSIPAR) tablet 180 mg  180 mg Oral Q M,W,F-HD Penninger, Lindsay, PA   180 mg at 02/03/20 1256  . Darbepoetin Alfa (ARANESP) injection 60 mcg  60 mcg Intravenous Q Mon-HD Reesa Chew, MD   60 mcg at 02/01/20 1038  . docusate sodium (COLACE) capsule 100 mg  100 mg Oral Daily Bowser, Laurel Dimmer, NP   100 mg at 02/04/20 0810  . ferric citrate (AURYXIA) tablet 420 mg  420 mg Oral TID WC Ernest Haber, PA-C   420 mg at 02/04/20 0809  . heparin ADULT infusion 100 units/mL (25000 units/284mL sodium chloride 0.45%)  1,200 Units/hr Intravenous Continuous Bertis Ruddy, RPH 12 mL/hr at 02/03/20 1302 1,200 Units/hr at 02/03/20 1302  . hydrALAZINE (APRESOLINE) injection 10 mg  10 mg Intravenous Q6H PRN Debbe Odea, MD   10 mg at 01/24/20 1153  . hydrocortisone cream 1 %   Topical QID PRN Inda Merlin  J, MD   Given at 02/01/20 0230  . iohexol (OMNIPAQUE) 300 MG/ML solution 150 mL  150 mL Intra-arterial Once PRN Jacqulynn Cadet, MD      . metoprolol tartrate (LOPRESSOR) tablet 25 mg  25 mg Oral BID Fay Records, MD   25 mg at 02/04/20 0810  . multivitamin (RENA-VIT) tablet 1 tablet  1 tablet Oral QHS Roney Jaffe, MD   1 tablet at 02/03/20 2109  . ondansetron (ZOFRAN) injection 4 mg  4 mg Intravenous Q6H PRN Debbe Odea, MD   4 mg at 02/03/20  1927  . ondansetron (ZOFRAN-ODT) disintegrating tablet 4 mg  4 mg Oral Q8H PRN Icard, Bradley L, DO   4 mg at 01/18/20 1818  . oxyCODONE (Oxy IR/ROXICODONE) immediate release tablet 10 mg  10 mg Oral Q4H PRN Debbe Odea, MD   10 mg at 02/03/20 2114  . pantoprazole (PROTONIX) EC tablet 40 mg  40 mg Oral QAC breakfast Donnamae Jude, RPH   40 mg at 02/04/20 0809  . polyethylene glycol (MIRALAX / GLYCOLAX) packet 17 g  17 g Oral BID Charlynne Cousins, MD   17 g at 02/04/20 3009  . senna (SENOKOT) tablet 8.6 mg  1 tablet Oral Daily Bowser, Grace E, NP   8.6 mg at 02/04/20 0811  . simethicone (MYLICON) chewable tablet 80 mg  80 mg Oral TID Cristal Generous, NP   80 mg at 02/04/20 0811  . warfarin (COUMADIN) tablet 9 mg  9 mg Oral ONCE-1600 Bertis Ruddy, RPH      . Warfarin - Pharmacist Dosing Inpatient   Does not apply q1600 Karren Cobble, [facility]   Given at 01/30/20 1721      Review of Systems: 10 systems reviewed and negative except per interval history/subjective  Physical Exam: Vitals:   02/04/20 0455 02/04/20 0808  BP: (!) 160/87 136/78  Pulse: 86 89  Resp: 18   Temp: 98.8 F (37.1 C)   SpO2: 100% 99%   Total I/O In: 252 [P.O.:240; I.V.:12] Out: 0   Intake/Output Summary (Last 24 hours) at 02/04/2020 1043 Last data filed at 02/04/2020 2330 Gross per 24 hour  Intake 492 ml  Output 3500 ml  Net -3008 ml   Constitutional: well-appearing, no acute distress ENMT: ears and nose without scars or lesions, MMM CV: normal rate, no edema Respiratory: bilateral chest rise, normal work of breathing Gastrointestinal: soft, NABS Skin: no visible lesions or rashes Psych: alert, judgement/insight appropriate, appropriate mood and affect   Test Results I personally reviewed new and old clinical labs and radiology tests Lab Results  Component Value Date   NA 132 (L) 02/04/2020   K 4.5 02/04/2020   CL 95 (L) 02/04/2020   CO2 27 02/04/2020   BUN 21 (H) 02/04/2020   CREATININE  4.70 (H) 02/04/2020   CALCIUM 9.3 02/04/2020   ALBUMIN 2.5 (L) 02/04/2020   PHOS 3.0 02/04/2020

## 2020-02-05 DIAGNOSIS — I700 Atherosclerosis of aorta: Secondary | ICD-10-CM

## 2020-02-05 DIAGNOSIS — R578 Other shock: Secondary | ICD-10-CM

## 2020-02-05 LAB — CBC
HCT: 26.9 % — ABNORMAL LOW (ref 36.0–46.0)
Hemoglobin: 8.4 g/dL — ABNORMAL LOW (ref 12.0–15.0)
MCH: 32.2 pg (ref 26.0–34.0)
MCHC: 31.2 g/dL (ref 30.0–36.0)
MCV: 103.1 fL — ABNORMAL HIGH (ref 80.0–100.0)
Platelets: 320 10*3/uL (ref 150–400)
RBC: 2.61 MIL/uL — ABNORMAL LOW (ref 3.87–5.11)
RDW: 16.9 % — ABNORMAL HIGH (ref 11.5–15.5)
WBC: 9.9 10*3/uL (ref 4.0–10.5)
nRBC: 1.3 % — ABNORMAL HIGH (ref 0.0–0.2)

## 2020-02-05 LAB — RENAL FUNCTION PANEL
Albumin: 2.4 g/dL — ABNORMAL LOW (ref 3.5–5.0)
Anion gap: 10 (ref 5–15)
BUN: 34 mg/dL — ABNORMAL HIGH (ref 6–20)
CO2: 24 mmol/L (ref 22–32)
Calcium: 9.4 mg/dL (ref 8.9–10.3)
Chloride: 95 mmol/L — ABNORMAL LOW (ref 98–111)
Creatinine, Ser: 6.01 mg/dL — ABNORMAL HIGH (ref 0.44–1.00)
GFR calc Af Amer: 8 mL/min — ABNORMAL LOW (ref >60)
GFR calc non Af Amer: 7 mL/min — ABNORMAL LOW (ref >60)
Glucose, Bld: 87 mg/dL (ref 70–99)
Phosphorus: 3.4 mg/dL (ref 2.5–4.6)
Potassium: 4.6 mmol/L (ref 3.5–5.1)
Sodium: 129 mmol/L — ABNORMAL LOW (ref 135–145)

## 2020-02-05 LAB — PROTIME-INR
INR: 2.1 — ABNORMAL HIGH (ref 0.8–1.2)
Prothrombin Time: 22.6 seconds — ABNORMAL HIGH (ref 11.4–15.2)

## 2020-02-05 LAB — HEPARIN LEVEL (UNFRACTIONATED): Heparin Unfractionated: 0.3 IU/mL (ref 0.30–0.70)

## 2020-02-05 MED ORDER — CINACALCET HCL 30 MG PO TABS
ORAL_TABLET | ORAL | Status: AC
  Filled 2020-02-05: qty 6

## 2020-02-05 MED ORDER — CALCITRIOL 0.5 MCG PO CAPS
ORAL_CAPSULE | ORAL | Status: AC
  Administered 2020-02-05: 2 ug via ORAL
  Filled 2020-02-05: qty 4

## 2020-02-05 MED ORDER — WARFARIN SODIUM 3 MG PO TABS
9.0000 mg | ORAL_TABLET | Freq: Once | ORAL | Status: AC
  Administered 2020-02-05: 9 mg via ORAL
  Filled 2020-02-05: qty 3

## 2020-02-05 MED ORDER — FERRIC CITRATE 1 GM 210 MG(FE) PO TABS
210.0000 mg | ORAL_TABLET | Freq: Three times a day (TID) | ORAL | Status: DC
  Administered 2020-02-05 – 2020-02-12 (×19): 210 mg via ORAL
  Filled 2020-02-05 (×17): qty 1

## 2020-02-05 MED ORDER — ACETAMINOPHEN 325 MG PO TABS
ORAL_TABLET | ORAL | Status: AC
  Filled 2020-02-05: qty 2

## 2020-02-05 NOTE — Assessment & Plan Note (Signed)
--  no inpt treatment --consider statin as outpt

## 2020-02-05 NOTE — Progress Notes (Signed)
Nephrology Follow-Up Consult note   Assessment/Recommendations: [name] is a/an 59 y.o. female with a past medical history significant for ESRD, aortic and mitral valve replacement on Coumadin, admitted for retroperitoneal bleed.     OP HD:MWF  4h 61kg 450/800 2/2 bath P2 Hep 1800 -sensipar 180mg  -calcitriol 12mcg -no esa, no iron   1.Lretroperitonealperirenal bleed/hematoma - Bleed seen on CT 8/18.  Status post multiple blood products requiring IV heparin for bridge given history of valve replacement.  Now status post renal embolization with VIR.  Hemoglobin has stabilized - coumadin restarted 9/10, continues on IV heparin, INR rising appropriately, near goal 3.Hx AVR/MVR-anticoagulation as above 4. ESRD- on HD MWF. HD per schedule.  No issues on dialysis today. 5. Anemiaof CKD- As above S/p 6 units pRBC. TSAT 84% 8/30.  Aranesp 44mcg qweekly started on 9/13. Consider increasing if needed 6. Secondary hyperparathyroidism- Phos at goal. Continue calcitriol and sensipar.  Phosphorus well within goal.  We will continue to decrease dose down to 210 mg 3 times daily. Will add on PTH given she has been here for 30 days 7.HTN/volume- BP stable but slightly elevated, [name] decreased to 60.5 due to hyponatremia, did not achieve dry weight on 9/15.  Will attempt to get to [name] as able 8. Nutrition- Renal diet w/fluid restrictions. Alb 2.5. Protein supplements.     Recommendations conveyed to primary service.    [facility] 02/05/2020 10:39 AM  ___________________________________________________________  CC: ESRD  Interval History/Subjective: Patient feels relatively well at this time.  Continues to have some bloody vaginal discharge.  Considering urology involvement per primary team.  No other complaints.   Medications:  Current Facility-Administered Medications  Medication Dose Route Frequency Provider Last Rate Last  Admin  . 0.9 %  sodium chloride infusion (Manually program via Guardrails IV Fluids)   Intravenous Once Zierle-Ghosh, Asia B, DO      . 0.9 %  sodium chloride infusion (Manually program via Guardrails IV Fluids)   Intravenous Once Shawna Clamp, MD      . 0.9 %  sodium chloride infusion   Intravenous PRN Charlynne Cousins, MD      . acetaminophen (TYLENOL) 325 MG tablet           . acetaminophen (TYLENOL) tablet 650 mg  650 mg Oral Q6H PRN Charlynne Cousins, MD   650 mg at 02/05/20 0729  . calcitRIOL (ROCALTROL) capsule 2 mcg  2 mcg Oral Q M,W,F Gean Quint, MD   2 mcg at 02/03/20 1242  . Chlorhexidine Gluconate Cloth 2 % PADS 6 each  6 each Topical Q0600 Samuella Cota, MD   6 each at 02/03/20 0703  . cinacalcet (SENSIPAR) tablet 180 mg  180 mg Oral Q M,W,F-HD Penninger, Lindsay, PA   180 mg at 02/03/20 1256  . Darbepoetin Alfa (ARANESP) injection 60 mcg  60 mcg Intravenous Q Mon-HD Reesa Chew, MD   60 mcg at 02/01/20 1038  . docusate sodium (COLACE) capsule 100 mg  100 mg Oral Daily Bowser, Laurel Dimmer, NP   100 mg at 02/04/20 0810  . ferric citrate (AURYXIA) tablet 420 mg  420 mg Oral TID WC Ernest Haber, PA-C   420 mg at 02/04/20 1703  . heparin ADULT infusion 100 units/mL (25000 units/22mL sodium chloride 0.45%)  1,200 Units/hr Intravenous Continuous Bertis Ruddy, RPH 12 mL/hr at 02/05/20 0925 1,200 Units/hr at 02/05/20 0925  . hydrALAZINE (APRESOLINE) injection 10 mg  10 mg Intravenous Q6H  PRN Debbe Odea, MD   10 mg at 01/24/20 1153  . hydrocortisone cream 1 %   Topical QID PRN Shalhoub, Sherryll Burger, MD   Given at 02/01/20 0230  . iohexol (OMNIPAQUE) 300 MG/ML solution 150 mL  150 mL Intra-arterial Once PRN Jacqulynn Cadet, MD      . metoprolol tartrate (LOPRESSOR) tablet 25 mg  25 mg Oral BID Fay Records, MD   25 mg at 02/04/20 2115  . multivitamin (RENA-VIT) tablet 1 tablet  1 tablet Oral QHS Roney Jaffe, MD   1 tablet at 02/04/20 2115  . ondansetron (ZOFRAN)  injection 4 mg  4 mg Intravenous Q6H PRN Debbe Odea, MD   4 mg at 02/03/20 1927  . ondansetron (ZOFRAN-ODT) disintegrating tablet 4 mg  4 mg Oral Q8H PRN Icard, Bradley L, DO   4 mg at 01/18/20 1818  . oxyCODONE (Oxy IR/ROXICODONE) immediate release tablet 10 mg  10 mg Oral Q4H PRN Debbe Odea, MD   10 mg at 02/04/20 2122  . pantoprazole (PROTONIX) EC tablet 40 mg  40 mg Oral QAC breakfast Donnamae Jude, RPH   40 mg at 02/04/20 0809  . polyethylene glycol (MIRALAX / GLYCOLAX) packet 17 g  17 g Oral BID Charlynne Cousins, MD   17 g at 02/04/20 2951  . senna (SENOKOT) tablet 8.6 mg  1 tablet Oral Daily Bowser, Grace E, NP   8.6 mg at 02/04/20 0811  . simethicone (MYLICON) chewable tablet 80 mg  80 mg Oral TID Cristal Generous, NP   80 mg at 02/04/20 2115  . Warfarin - Pharmacist Dosing Inpatient   Does not apply q1600 Karren Cobble, [facility]   Given at 02/04/20 1704      Review of Systems: 10 systems reviewed and negative except per interval history/subjective  Physical Exam: Vitals:   02/05/20 1000 02/05/20 1030  BP: 140/79 133/77  Pulse: 93 92  Resp:    Temp:    SpO2:     No intake/output data recorded.  Intake/Output Summary (Last 24 hours) at 02/05/2020 1039 Last data filed at 02/05/2020 8841 Gross per 24 hour  Intake 1070.2 ml  Output 0 ml  Net 1070.2 ml   Constitutional: well-appearing, no acute distress ENMT: ears and nose without scars or lesions, MMM CV: normal rate, no edema Respiratory: bilateral chest rise, normal work of breathing Gastrointestinal: soft, NABS Skin: no visible lesions or rashes Psych: alert, judgement/insight appropriate, appropriate mood and affect   Test Results I personally reviewed new and old clinical labs and radiology tests Lab Results  Component Value Date   NA 129 (L) 02/05/2020   K 4.6 02/05/2020   CL 95 (L) 02/05/2020   CO2 24 02/05/2020   BUN 34 (H) 02/05/2020   CREATININE 6.01 (H) 02/05/2020   CALCIUM 9.4 02/05/2020    ALBUMIN 2.4 (L) 02/05/2020   PHOS 3.4 02/05/2020

## 2020-02-05 NOTE — Progress Notes (Signed)
PROGRESS NOTE  [name] VFI:[id number] DOB: 09/16/1960 DOA: 01/06/2020 PCP: Benito Mccreedy, MD  Brief History   59 year old woman PMH including ESRD, status post aortic and mitral valve replacement on warfarin, presented with flank pain; CT revealed large spontaneous left retroperitoneal hematoma, INR was 3.6. seen by surgery, cardiology, PCCM, nephrology and urology. Required total 6 units PRBCs, 2 units FFP and vitamin K. About 10 days in pt developed gross hematuria, imaging nondiagnostic for renal mass. Attending discussed w/ urology; given negative imaging rec was to followup as outpat w/ urology to further investigate. Developed flank pain about 2 weeks in, CT showed enlarging hematoma associate w/ left kidney. Underwent left renal angiogram and coil embolization. S/p post IR guided angiogram and embolization bleeding artery 9/8 patient stablized. Hemoglobin is stabilized. Warfarin resumed. Remains on heparin infusion until INR therapeutic then anticipate discharge home.   A & P  Retroperitoneal hemorrhage --spontaneous --Treated with 6 units PRBC, vitamin K and 2 units FFP --s/p embolization. --Hemoglobin stable --Monitor clinically.  End-stage renal disease on hemodialysis [facility]) --Continue management per nephrology.  S/P mitral valve replacement with metallic valve --Continue heparin infusion until INR therapeutic. Goal 2.5-3.5. Continue warfarin per pharmacy. --f/u with warfarin clinic  Status post mechanical aortic valve replacement --Continue heparin infusion until INR therapeutic. Continue warfarin per pharmacy.  Urethral bleeding --minimal w/ no change in CBC --likely old blood; minimal w/ stable Hgb, expect will solve resolve --can f/u w/ urology as an outpatient  Hemorrhagic shock [facility]) -admitted by PCCM --secondary to spontaneous retroperitoneal bleed, ABLA in setting of supra-therapeutic INR --IR was consulted in ED but at that time was not a candidate  for intervention.  Aortic atherosclerosis (HCC) --no inpt treatment --consider statin as outpt  Disposition Plan:  Discussion: Hemoglobin stable. Continue heparin infusion until INR therapeutic then anticipate discharge home.  Status is: Inpatient  Remains inpatient appropriate because:IV treatments appropriate due to intensity of illness or inability to take PO   Dispo: The patient is from: Home              Anticipated d/c is to: Home              Anticipated d/c date is: 3 days              Patient currently is not medically stable to d/c.  DVT prophylaxis: heparin infusion SCDs Start: 01/06/20 1944 Code Status: Full Family Communication: none  [name], MD  [facility] Direct contact: see [email or web address] (further directions at bottom of note if needed) 7PM-7AM contact night coverage as at bottom of note 02/05/2020, 7:17 PM  LOS: 30 days    Consults:   PCCM admitted  General surgery  Nephrology  Cardiology  Urology  IR   Interval History/Subjective  No new issues Some dark blood w/ urination  Objective   Vitals:  Vitals:   02/05/20 1100 02/05/20 1129  BP: 127/75 (!) 146/83  Pulse: 99 93  Resp:  16  Temp:  98.6 F (37 C)  SpO2:  98%    Exam:  Constitutional:    Appears calm and comfortable ENMT:   grossly normal hearing  Respiratory:   CTA bilaterally, no w/r/r.   Respiratory effort normal. Cardiovascular:   RRR, no m/r/g Psychiatric:   Mental status o Mood, affect appropriate  I have personally reviewed the following:   Today's Data   BMP noted Na stable 129  Hgb stable 8.4  INR up to 2.1  Scheduled Meds:  sodium  chloride   Intravenous Once   sodium chloride   Intravenous Once   calcitRIOL  2 mcg Oral Q M,W,F   Chlorhexidine Gluconate Cloth  6 each Topical Q0600   cinacalcet  180 mg Oral Q M,W,F-HD   darbepoetin (ARANESP) injection - DIALYSIS  60 mcg Intravenous Q Mon-HD   docusate sodium  100 mg Oral  Daily   ferric citrate  210 mg Oral TID WC   metoprolol tartrate  25 mg Oral BID   multivitamin  1 tablet Oral QHS   pantoprazole  40 mg Oral QAC breakfast   polyethylene glycol  17 g Oral BID   senna  1 tablet Oral Daily   simethicone  80 mg Oral TID   Warfarin - Pharmacist Dosing Inpatient   Does not apply q1600   Continuous Infusions:  sodium chloride     heparin 1,250 Units/hr (02/05/20 1243)    Principal Problem:   Retroperitoneal hemorrhage Active Problems:   End-stage renal disease on hemodialysis (HCC)   S/P mitral valve replacement with metallic valve   Status post mechanical aortic valve replacement   Nontraumatic retroperitoneal hematoma   Urethral bleeding   Hemorrhagic shock ([place])   Aortic atherosclerosis ([place])   LOS: 30 days   How to contact the [facility] Attending or Consulting provider 7A - 7P or covering provider during after hours [place], for this patient?  1. Check the care team in [facility] and look for a) attending/consulting TRH provider listed and b) the [facility] team listed 2. Log into [email or web address] and use [place] universal password to access. If you do not have the password, please contact the hospital operator. 3. Locate the [facility] provider you are looking for under [facility] and page to a number that you can be directly reached. 4. If you still have difficulty reaching the provider, please page the [facility] (Director on Call) for the Hospitalists listed on amion for assistance.

## 2020-02-05 NOTE — Assessment & Plan Note (Addendum)
-  admitted by PCCM --secondary to spontaneous retroperitoneal bleed, ABLA in setting of supra-therapeutic INR

## 2020-02-05 NOTE — Progress Notes (Signed)
PT Cancellation Note  Patient Details Name: [name] MRN: [id number] DOB: [birth year]   Cancelled Treatment:    Reason Eval/Treat Not Completed: Patient at procedure or test/unavailable. Pt currently in HD. Will continue to follow.    [name] 02/05/2020, 8:58 AM   [name], PT, DPT Acute Rehabilitation Services Pager: [phone] Office: [phone]

## 2020-02-05 NOTE — Progress Notes (Signed)
[place] for heparin, warfarin Indication: mechanical AVR/MVR  No Known Allergies  Patient Measurements: Height: 5\' 6"  (167.6 cm) Weight: 63.7 kg (140 lb 6.9 oz) (standing) IBW/kg (Calculated) : 59.3 Heparin Dosing Weight: 62kg  Vital Signs: Temp: 98.6 F (37 C) (09/17 1129) Temp Source: Oral (09/17 1129) BP: 146/83 (09/17 1129) Pulse Rate: 93 (09/17 1129)  Labs: Recent Labs    02/03/20 0744 02/03/20 0744 02/03/20 0745 02/04/20 0921 02/05/20 0259  HGB 8.1*   < >  --  8.5* 8.4*  HCT 25.7*  --   --  27.2* 26.9*  PLT 340  --   --  339 320  LABPROT 19.6*  --   --  20.7* 22.6*  INR 1.7*  --   --  1.9* 2.1*  HEPARINUNFRC  --   --  0.36 0.37 0.30  CREATININE 6.51*  --   --  4.70* 6.01*   < > = values in this interval not displayed.    Estimated Creatinine Clearance: 9.7 mL/min (A) (by C-G formula based on SCr of 6.01 mg/dL (H)).   Assessment: 59 yo W on warfarin PTA for hx mechanical AVR/MVR (goal 2.5-3.5), reversed with vit K 10 mg IV + 2 units FFP  + 4 units PRBcs on 8/18 and another vit K po 5mg  on 9/7 with 2 unit RBCs 9/8-9/9 due to continued retroperitoneal bleed.  PTA Warfarin regimen: 9 mg daily except 6 mg on Mondays - last 8/18 (INR was 3.6 on admit 8/18)    Heparin level today low end therapeutic (drifted down some) on 1200 units/hr.  INR subtherapeutic with higher goals, uptrend to 2.1.    Goal of Therapy:  Heparin level 0.3 -0.5 units/ml (low goal due to bleed) INR 2.5- 3.5  Monitor platelets by anticoagulation protocol: Yes   Plan:  Increase heparin gtt slightly to 1250 units/hr Warfarin 9 mg PO x 1 today Daily heparin level, INR, CBC, s/s bleeding  Bertis Ruddy, PharmD Clinical Pharmacist Please check AMION for all [place] numbers 02/05/2020 12:28 PM

## 2020-02-05 NOTE — Progress Notes (Signed)
   02/05/20 1129  Hand-Off documentation  Handoff Given Given to shift RN/LPN  Report given to (Full Name) Sharin Grave  Handoff Received Received from Transfer Unit/facility  Report received from (Full Name) Sole Lengacher,RN  Vital Signs  Temp 98.6 F (37 C)  Temp Source Oral  Pulse Rate 93  Pulse Rate Source Monitor  Resp 16  BP (!) 146/83  BP Location Left Arm  BP Method Automatic  Patient Position (if appropriate) Lying  Oxygen Therapy  SpO2 98 %  O2 Device Nasal Cannula  O2 Flow Rate (L/min) 2 L/min  Pain Assessment  Pain Scale 0-10  Pain Score 0  Dialysis Weight  Weight 60.2 kg  Type of Weight Post-Dialysis  During Hemodialysis Assessment  KECN 272 KECN  Dialysis Fluid Bolus Normal Saline  Bolus Amount (mL) 250 mL  Intra-Hemodialysis Comments Tx completed  Post-Hemodialysis Assessment  Rinseback Volume (mL) 250 mL  KECN 272 V  Dialyzer Clearance Lightly streaked  Duration of HD Treatment -hour(s) 4 hour(s)  Hemodialysis Intake (mL) 500 mL  UF Total -Machine (mL) 4000 mL  Net UF (mL) 3500 mL  Tolerated HD Treatment Yes  Post-Hemodialysis Comments tx completed  AVG/AVF Arterial Site Held (minutes) 10 minutes  AVG/AVF Venous Site Held (minutes) 10 minutes  Fistula / Graft Right Upper arm Arteriovenous fistula  No Placement Date or Time found.   Orientation: Right  Access Location: Upper arm  Access Type: (c) Arteriovenous fistula  Site Condition No complications  Fistula / Graft Assessment Present;Thrill;Bruit  Status Deaccessed;Flushed;Patent  Drainage Description None

## 2020-02-06 LAB — RENAL FUNCTION PANEL
Albumin: 2.8 g/dL — ABNORMAL LOW (ref 3.5–5.0)
Anion gap: 12 (ref 5–15)
BUN: 20 mg/dL (ref 6–20)
CO2: 25 mmol/L (ref 22–32)
Calcium: 9.2 mg/dL (ref 8.9–10.3)
Chloride: 96 mmol/L — ABNORMAL LOW (ref 98–111)
Creatinine, Ser: 4.49 mg/dL — ABNORMAL HIGH (ref 0.44–1.00)
GFR calc Af Amer: 12 mL/min — ABNORMAL LOW (ref >60)
GFR calc non Af Amer: 10 mL/min — ABNORMAL LOW (ref >60)
Glucose, Bld: 122 mg/dL — ABNORMAL HIGH (ref 70–99)
Phosphorus: 3.2 mg/dL (ref 2.5–4.6)
Potassium: 4 mmol/L (ref 3.5–5.1)
Sodium: 133 mmol/L — ABNORMAL LOW (ref 135–145)

## 2020-02-06 LAB — PROTIME-INR
INR: 2 — ABNORMAL HIGH (ref 0.8–1.2)
Prothrombin Time: 22 seconds — ABNORMAL HIGH (ref 11.4–15.2)

## 2020-02-06 LAB — CBC
HCT: 29.1 % — ABNORMAL LOW (ref 36.0–46.0)
Hemoglobin: 8.8 g/dL — ABNORMAL LOW (ref 12.0–15.0)
MCH: 31.7 pg (ref 26.0–34.0)
MCHC: 30.2 g/dL (ref 30.0–36.0)
MCV: 104.7 fL — ABNORMAL HIGH (ref 80.0–100.0)
Platelets: 305 10*3/uL (ref 150–400)
RBC: 2.78 MIL/uL — ABNORMAL LOW (ref 3.87–5.11)
RDW: 17.7 % — ABNORMAL HIGH (ref 11.5–15.5)
WBC: 10 10*3/uL (ref 4.0–10.5)
nRBC: 0.7 % — ABNORMAL HIGH (ref 0.0–0.2)

## 2020-02-06 LAB — PTH, INTACT AND CALCIUM
Calcium, Total (PTH): 9.5 mg/dL (ref 8.7–10.2)
PTH: 109 pg/mL — ABNORMAL HIGH (ref 15–65)

## 2020-02-06 LAB — HEPARIN LEVEL (UNFRACTIONATED): Heparin Unfractionated: 0.46 IU/mL (ref 0.30–0.70)

## 2020-02-06 MED ORDER — CINACALCET HCL 30 MG PO TABS
120.0000 mg | ORAL_TABLET | ORAL | Status: DC
  Administered 2020-02-08 – 2020-02-12 (×3): 120 mg via ORAL
  Filled 2020-02-06 (×3): qty 4

## 2020-02-06 MED ORDER — WARFARIN SODIUM 10 MG PO TABS
10.0000 mg | ORAL_TABLET | Freq: Once | ORAL | Status: AC
  Administered 2020-02-06: 10 mg via ORAL
  Filled 2020-02-06: qty 1

## 2020-02-06 NOTE — Progress Notes (Signed)
PT Cancellation Note  Patient Details Name: [name] MRN: [id number] DOB: May 28, 1960   Cancelled Treatment:    Reason Eval/Treat Not Completed: Patient declined, no reason specified. Patient just not feeling up to it. Will check back tomorrow if time allows.     Evola Hollis 02/06/2020, 1:36 PM

## 2020-02-06 NOTE — Progress Notes (Signed)
PROGRESS NOTE  [name] KVQ:[id number] DOB: June 18, 1960 DOA: 01/06/2020 PCP: Benito Mccreedy, MD  Brief History   59 year old woman PMH including ESRD, status post aortic and mitral valve replacement on warfarin, presented with flank pain; CT revealed large spontaneous left retroperitoneal hematoma, INR was 3.6. seen by surgery, cardiology, PCCM, nephrology and urology. Required total 6 units PRBCs, 2 units FFP and vitamin K. About 10 days in pt developed gross hematuria, imaging nondiagnostic for renal mass. Attending discussed w/ urology; given negative imaging rec was to followup as outpat w/ urology to further investigate. Developed flank pain about 2 weeks in, CT showed enlarging hematoma associate w/ left kidney. Underwent left renal angiogram and coil embolization. S/p post IR guided angiogram and embolization bleeding artery 9/8 patient stablized. Hemoglobin is stabilized. Warfarin resumed. Remains on heparin infusion until INR therapeutic then anticipate discharge home.   A & P  Retroperitoneal hemorrhage --spontaneous --Treated with 6 units PRBC, vitamin K and 2 units FFP --s/p embolization. --Hemoglobin stable --Monitor clinically.  End-stage renal disease on hemodialysis [facility]) --Continue management per nephrology.  S/P mitral valve replacement with metallic valve --Continue heparin infusion until INR therapeutic. Goal 2.5-3.5. Continue warfarin per pharmacy. --f/u with warfarin clinic  Status post mechanical aortic valve replacement --Continue heparin infusion until INR therapeutic. Continue warfarin per pharmacy.  Urethral bleeding --minimal w/ no change in CBC --likely old blood; minimal w/ stable Hgb, expect will spontaneously resolve --can f/u w/ urology as an outpatient  Hemorrhagic shock ([place]) -admitted by PCCM --secondary to spontaneous retroperitoneal bleed, ABLA in setting of supra-therapeutic INR --IR was consulted in ED but at that time was not a  candidate for intervention.  Aortic atherosclerosis (HCC) --no inpt treatment --consider statin as outpt  Disposition Plan:  Discussion: Hemoglobin stable. Continue heparin infusion until INR therapeutic then anticipate discharge home.  Status is: Inpatient  Remains inpatient appropriate because:IV treatments appropriate due to intensity of illness or inability to take PO   Dispo: The patient is from: Home              Anticipated d/c is to: Home              Anticipated d/c date is: 3 days              Patient currently is not medically stable to d/c.  DVT prophylaxis: heparin infusion SCDs Start: 01/06/20 1944 Code Status: Full Family Communication: none  [name], MD  [facility] Direct contact: see [email or web address] (further directions at bottom of note if needed) 7PM-7AM contact night coverage as at bottom of note 02/06/2020, 2:29 PM  LOS: 31 days    Consults:  . PCCM admitted . General surgery . Nephrology . Cardiology . Urology . IR   Interval History/Subjective  CC f/u INR Feels ok, just a little of blood w/ urination Urologist told her to expect this to continue for a month as hematoma resolves.  Objective   Vitals:  Vitals:   02/06/20 0427 02/06/20 0949  BP: 118/70 132/77  Pulse: 81 86  Resp: 17   Temp: 98.6 F (37 C)   SpO2: 100%     Exam:  Physical Exam Vitals reviewed.  Cardiovascular:     Rate and Rhythm: Normal rate and regular rhythm.     Heart sounds: Normal heart sounds.  Pulmonary:     Effort: Pulmonary effort is normal.     Breath sounds: Normal breath sounds.  Neurological:     Mental Status: She is  alert.  Psychiatric:        Mood and Affect: Mood normal.        Behavior: Behavior normal.     I have personally reviewed the following:   Today's Data  . BMP noted . Hgb stable at 8.8 . INR 2.0  Scheduled Meds: . sodium chloride   Intravenous Once  . sodium chloride   Intravenous Once  . calcitRIOL  2 mcg Oral  Q M,W,F  . Chlorhexidine Gluconate Cloth  6 each Topical Q0600  . [START ON 02/08/2020] cinacalcet  120 mg Oral Q M,W,F-HD  . darbepoetin (ARANESP) injection - DIALYSIS  60 mcg Intravenous Q Mon-HD  . docusate sodium  100 mg Oral Daily  . ferric citrate  210 mg Oral TID WC  . metoprolol tartrate  25 mg Oral BID  . multivitamin  1 tablet Oral QHS  . pantoprazole  40 mg Oral QAC breakfast  . polyethylene glycol  17 g Oral BID  . senna  1 tablet Oral Daily  . simethicone  80 mg Oral TID  . warfarin  10 mg Oral ONCE-1600  . Warfarin - Pharmacist Dosing Inpatient   Does not apply q1600   Continuous Infusions: . sodium chloride    . heparin 1,250 Units/hr (02/06/20 0545)    Principal Problem:   Retroperitoneal hemorrhage Active Problems:   End-stage renal disease on hemodialysis (HCC)   S/P mitral valve replacement with metallic valve   Status post mechanical aortic valve replacement   Nontraumatic retroperitoneal hematoma   Urethral bleeding   Hemorrhagic shock ([place])   Aortic atherosclerosis ([place])   LOS: 31 days   How to contact the [facility] Attending or Consulting provider Between or covering provider during after hours [place], for this patient?  1. Check the care team in [facility] and look for a) attending/consulting TRH provider listed and b) the [facility] team listed 2. Log into [email or web address] and use [place]'s universal password to access. If you do not have the password, please contact the hospital operator. 3. Locate the [facility] provider you are looking for under [facility] and page to a number that you can be directly reached. 4. If you still have difficulty reaching the provider, please page the [facility] (Director on Call) for the Hospitalists listed on amion for assistance.

## 2020-02-06 NOTE — Progress Notes (Signed)
[place] for heparin, warfarin Indication: mechanical AVR/MVR  No Known Allergies  Patient Measurements: Height: 5\' 6"  (167.6 cm) Weight: 61.1 kg (134 lb 11.2 oz) (scale A) IBW/kg (Calculated) : 59.3 Heparin Dosing Weight: 62kg  Vital Signs: Temp: 98.6 F (37 C) (09/18 0427) Temp Source: Oral (09/18 0427) BP: 118/70 (09/18 0427) Pulse Rate: 81 (09/18 0427)  Labs: Recent Labs    02/04/20 0921 02/04/20 0921 02/05/20 0259 02/06/20 0604  HGB 8.5*   < > 8.4* 8.8*  HCT 27.2*  --  26.9* 29.1*  PLT 339  --  320 305  LABPROT 20.7*  --  22.6* 22.0*  INR 1.9*  --  2.1* 2.0*  HEPARINUNFRC 0.37  --  0.30 0.46  CREATININE 4.70*  --  6.01* 4.49*   < > = values in this interval not displayed.    Estimated Creatinine Clearance: 12.9 mL/min (A) (by C-G formula based on SCr of 4.49 mg/dL (H)).   Assessment: 59 yo W on warfarin PTA for hx mechanical AVR/MVR (goal 2.5-3.5), reversed with vit K 10 mg IV + 2 units FFP  + 4 units PRBcs on 8/18 and another vit K po 5mg  on 9/7 with 2 unit RBCs 9/8-9/9 due to continued retroperitoneal bleed.  PTA Warfarin regimen: 9 mg daily except 6 mg on Mondays - last 8/18 (INR was 3.6 on admit 8/18)    Heparin level today therapeutic. INR subtherapeutic at 2.0.   Goal of Therapy:  Heparin level 0.3 -0.5 units/ml (low goal due to bleed) INR 2.5- 3.5  Monitor platelets by anticoagulation protocol: Yes   Plan:  Continue heparin gtt 1250 units/hr Warfarin 10 mg PO x 1 today Daily heparin level, INR, CBC, s/s bleeding  Norina Buzzard, PharmD PGY1 Pharmacy Resident 02/06/2020 8:02 AM  Please check AMION for all [place] numbers

## 2020-02-06 NOTE — Progress Notes (Signed)
Nephrology Follow-Up Consult note   Assessment/Recommendations: [name] is a/an 59 y.o. female with a past medical history significant for ESRD, aortic and mitral valve replacement on Coumadin, admitted for retroperitoneal bleed.     OP HD:MWF  4h 61kg 450/800 2/2 bath P2 Hep 1800 -sensipar 180mg  -calcitriol 67mcg -no esa, no iron   1.Lretroperitonealperirenal bleed/hematoma - Bleed seen on CT 8/18.  Status post multiple blood products requiring IV heparin for bridge given history of valve replacement.  Now status post renal embolization with VIR.  Hemoglobin has stabilized - coumadin restarted 9/10, continues on IV heparin, INR rising appropriately, nearing goal of 2.5 3.Hx AVR/MVR-anticoagulation as above 4. ESRD- on HD MWF. HD per schedule.  No issues on dialysis today. 5. Anemiaof CKD- As above S/p 6 units pRBC. TSAT 84% 8/30.  Aranesp 75mcg qweekly started on 9/13. Consider increasing if needed 6. Secondary hyperparathyroidism- PTH 109 (has been here 30 days). Cont calcitriol. Decrease sensipar 180->120mg  three times weekly. Continue decreased dose of Auryxia 210 mg 3 times daily.  Follow-up PTH. 7.HTN/volume-finished dialysis slightly above dry weight of 60.5 yesterday.  Overall stable.  Blood pressure acceptable 8. Nutrition- Renal diet w/fluid restrictions. Alb 2.8. Protein supplements.     Recommendations conveyed to primary service.    [facility] 02/06/2020 9:00 AM  ___________________________________________________________  CC: ESRD  Interval History/Subjective: Patient feels well today without any complaints.  Tolerated dialysis yesterday without any issues.  Denies shortness of breath.  Vaginal bleeding persistent but largely unchanged.   Medications:  Current Facility-Administered Medications  Medication Dose Route Frequency Provider Last Rate Last Admin  . 0.9 %  sodium chloride infusion  (Manually program via Guardrails IV Fluids)   Intravenous Once Zierle-Ghosh, Asia B, DO      . 0.9 %  sodium chloride infusion (Manually program via Guardrails IV Fluids)   Intravenous Once Shawna Clamp, MD      . 0.9 %  sodium chloride infusion   Intravenous PRN Charlynne Cousins, MD      . acetaminophen (TYLENOL) tablet 650 mg  650 mg Oral Q6H PRN Charlynne Cousins, MD   650 mg at 02/06/20 0544  . calcitRIOL (ROCALTROL) capsule 2 mcg  2 mcg Oral Q M,W,F Gean Quint, MD   2 mcg at 02/05/20 1146  . Chlorhexidine Gluconate Cloth 2 % PADS 6 each  6 each Topical Q0600 Samuella Cota, MD   6 each at 02/03/20 0703  . cinacalcet (SENSIPAR) tablet 180 mg  180 mg Oral Q M,W,F-HD Penninger, Lindsay, PA   180 mg at 02/05/20 1148  . Darbepoetin Alfa (ARANESP) injection 60 mcg  60 mcg Intravenous Q Mon-HD Reesa Chew, MD   60 mcg at 02/01/20 1038  . docusate sodium (COLACE) capsule 100 mg  100 mg Oral Daily Bowser, Grace E, NP   100 mg at 02/05/20 1254  . ferric citrate (AURYXIA) tablet 210 mg  210 mg Oral TID WC Reesa Chew, MD   210 mg at 02/06/20 0545  . heparin ADULT infusion 100 units/mL (25000 units/236mL sodium chloride 0.45%)  1,250 Units/hr Intravenous Continuous Bertis Ruddy, RPH 12.5 mL/hr at 02/06/20 0545 1,250 Units/hr at 02/06/20 0545  . hydrALAZINE (APRESOLINE) injection 10 mg  10 mg Intravenous Q6H PRN Debbe Odea, MD   10 mg at 01/24/20 1153  . hydrocortisone cream 1 %   Topical QID PRN Shalhoub, Sherryll Burger, MD   Given at 02/01/20 0230  . iohexol (OMNIPAQUE)  300 MG/ML solution 150 mL  150 mL Intra-arterial Once PRN Jacqulynn Cadet, MD      . metoprolol tartrate (LOPRESSOR) tablet 25 mg  25 mg Oral BID Fay Records, MD   25 mg at 02/05/20 2043  . multivitamin (RENA-VIT) tablet 1 tablet  1 tablet Oral QHS Roney Jaffe, MD   1 tablet at 02/05/20 2044  . ondansetron (ZOFRAN) injection 4 mg  4 mg Intravenous Q6H PRN Debbe Odea, MD   4 mg at 02/05/20 2041  .  ondansetron (ZOFRAN-ODT) disintegrating tablet 4 mg  4 mg Oral Q8H PRN Icard, Bradley L, DO   4 mg at 01/18/20 1818  . oxyCODONE (Oxy IR/ROXICODONE) immediate release tablet 10 mg  10 mg Oral Q4H PRN Debbe Odea, MD   10 mg at 02/05/20 1749  . pantoprazole (PROTONIX) EC tablet 40 mg  40 mg Oral QAC breakfast Donnamae Jude, RPH   40 mg at 02/04/20 0809  . polyethylene glycol (MIRALAX / GLYCOLAX) packet 17 g  17 g Oral BID Charlynne Cousins, MD   17 g at 02/05/20 1253  . senna (SENOKOT) tablet 8.6 mg  1 tablet Oral Daily Bowser, Grace E, NP   8.6 mg at 02/05/20 1254  . simethicone (MYLICON) chewable tablet 80 mg  80 mg Oral TID Cristal Generous, NP   80 mg at 02/05/20 2043  . warfarin (COUMADIN) tablet 10 mg  10 mg Oral ONCE-1600 Cala Bradford, [facility]      . Warfarin - Pharmacist Dosing Inpatient   Does not apply q1600 Karren Cobble, [facility]   Given at 02/05/20 1642      Review of Systems: 10 systems reviewed and negative except per interval history/subjective  Physical Exam: Vitals:   02/05/20 2059 02/06/20 0427  BP: (!) 149/71 118/70  Pulse: 77 81  Resp: 18 17  Temp: 99.4 F (37.4 C) 98.6 F (37 C)  SpO2: 95% 100%   No intake/output data recorded.  Intake/Output Summary (Last 24 hours) at 02/06/2020 0900 Last data filed at 02/06/2020 0013 Gross per 24 hour  Intake 460 ml  Output 3502 ml  Net -3042 ml   Constitutional: well-appearing, no acute distress ENMT: ears and nose without scars or lesions, MMM CV: normal rate, no edema Respiratory: bilateral chest rise, normal work of breathing Gastrointestinal: soft, NABS Skin: no visible lesions or rashes Psych: alert, judgement/insight appropriate, appropriate mood and affect   Test Results I personally reviewed new and old clinical labs and radiology tests Lab Results  Component Value Date   NA 133 (L) 02/06/2020   K 4.0 02/06/2020   CL 96 (L) 02/06/2020   CO2 25 02/06/2020   BUN 20 02/06/2020   CREATININE 4.49 (H)  02/06/2020   CALCIUM 9.2 02/06/2020   ALBUMIN 2.8 (L) 02/06/2020   PHOS 3.2 02/06/2020

## 2020-02-06 NOTE — Progress Notes (Signed)
   Subjective/Chief Complaint:  1 - Left Renal Hemorrhage - s/p left renal artery emboliation 01/29/20 by IR for large expanding spontaneous hemoorage in setting of ESRD / acquitted cystic disease. Hgb stabilized after. 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  2 - End Stage Renal Disease - on hemodialysis per nephrology. Imaging x many w/o hydro.  Today "[name]" is stable. Hgb stable. Low grade fevers c/w post-embolization syndrome. Becoming theraputic on coumadin.    Objective: Vital signs in last 24 hours: Temp:  [98.6 F (37 C)-99.4 F (37.4 C)] 98.6 F (37 C) (09/18 0427) Pulse Rate:  [77-99] 81 (09/18 0427) Resp:  [16-18] 17 (09/18 0427) BP: (118-166)/(70-92) 118/70 (09/18 0427) SpO2:  [95 %-100 %] 100 % (09/18 0427) Weight:  [60.2 kg-63.7 kg] 61.1 kg (09/18 0034) Last BM Date: 02/03/20  Intake/Output from previous day: 09/17 0701 - 09/18 0700 In: 460 [P.O.:460] Out: 3502 [Stool:2] Intake/Output this shift: No intake/output data recorded.  General appearance: alert, cooperative and very pleasant.  Eyes: negative Nose: Nares normal. Septum midline. Mucosa normal. No drainage or sinus tenderness. Throat: lips, mucosa, and tongue normal; teeth and gums normal Back: symmetric, no curvature. ROM normal. No CVA tenderness. GI: soft, non-tender; bowel sounds normal; no masses,  no organomegaly and some left flank and abd firmness as expected with resolving hematoma. NO r/g.  Extremities: extremities normal, atraumatic, no cyanosis or edema Skin: Skin color, texture, turgor normal. No rashes or lesions Neurologic: Grossly normal  Lab Results:  Recent Labs    02/05/20 0259 02/06/20 0604  WBC 9.9 10.0  HGB 8.4* 8.8*  HCT 26.9* 29.1*  PLT 320 305   BMET Recent Labs    02/05/20 0259 02/06/20 0604  NA 129* 133*  K 4.6 4.0  CL 95* 96*  CO2 24 25  GLUCOSE 87 122*  BUN 34* 20  CREATININE 6.01* 4.49*  CALCIUM 9.4 9.2   PT/INR Recent Labs     02/05/20 0259 02/06/20 0604  LABPROT 22.6* 22.0*  INR 2.1* 2.0*   ABG No results for input(s): PHART, HCO3 in the last 72 hours.  Invalid input(s): PCO2, PO2  Studies/Results: No results found.  Anti-infectives: Anti-infectives (From admission, onward)   None      Assessment/Plan:  1 - Left Renal Hemorrhage - doing well s/p embolization. Fevers will likley persist low grade for few weeks and is typical post embolization. Greatly appreciate medical service and IR life saving interventions.   2 - End Stage Renal Disease - per nephrology.  Will follow PRN at this point. OK for DC at anypoint from [employer].   Alexis Frock 02/06/2020

## 2020-02-07 DIAGNOSIS — D6869 Other thrombophilia: Secondary | ICD-10-CM | POA: Diagnosis present

## 2020-02-07 LAB — HEPARIN LEVEL (UNFRACTIONATED)
Heparin Unfractionated: 0.55 IU/mL (ref 0.30–0.70)
Heparin Unfractionated: 0.42 IU/mL (ref 0.30–0.70)

## 2020-02-07 LAB — RENAL FUNCTION PANEL
Albumin: 2.8 g/dL — ABNORMAL LOW (ref 3.5–5.0)
Anion gap: 17 — ABNORMAL HIGH (ref 5–15)
BUN: 38 mg/dL — ABNORMAL HIGH (ref 6–20)
CO2: 21 mmol/L — ABNORMAL LOW (ref 22–32)
Calcium: 9.6 mg/dL (ref 8.9–10.3)
Chloride: 92 mmol/L — ABNORMAL LOW (ref 98–111)
Creatinine, Ser: 7.18 mg/dL — ABNORMAL HIGH (ref 0.44–1.00)
GFR calc Af Amer: 7 mL/min — ABNORMAL LOW (ref >60)
GFR calc non Af Amer: 6 mL/min — ABNORMAL LOW (ref >60)
Glucose, Bld: 96 mg/dL (ref 70–99)
Phosphorus: 4 mg/dL (ref 2.5–4.6)
Potassium: 4.3 mmol/L (ref 3.5–5.1)
Sodium: 130 mmol/L — ABNORMAL LOW (ref 135–145)

## 2020-02-07 LAB — PROTIME-INR
INR: 1.9 — ABNORMAL HIGH (ref 0.8–1.2)
Prothrombin Time: 21.4 seconds — ABNORMAL HIGH (ref 11.4–15.2)

## 2020-02-07 MED ORDER — WARFARIN SODIUM 2 MG PO TABS
12.0000 mg | ORAL_TABLET | Freq: Once | ORAL | Status: AC
  Administered 2020-02-07: 12 mg via ORAL
  Filled 2020-02-07: qty 1

## 2020-02-07 NOTE — Progress Notes (Signed)
Contacted phlebotomy about blood work, they are running late but they will get blood work done soon.

## 2020-02-07 NOTE — Assessment & Plan Note (Signed)
--  secondary to mechanical AV and MV --heparin until warfarin therapeutic

## 2020-02-07 NOTE — Progress Notes (Signed)
PROGRESS NOTE  [name] VEL:[id number] DOB: 1960/10/21 DOA: 01/06/2020 PCP: Benito Mccreedy, MD  Brief History   59 year old woman PMH including ESRD, status post aortic and mitral valve replacement on warfarin, presented with flank pain; CT revealed large spontaneous left retroperitoneal hematoma, INR was 3.6. seen by surgery, cardiology, PCCM, nephrology and urology. Required total 6 units PRBCs, 2 units FFP and vitamin K. About 10 days in pt developed gross hematuria, imaging nondiagnostic for renal mass. Attending discussed w/ urology; given negative imaging rec was to followup as outpat w/ urology to further investigate. Developed flank pain about 2 weeks in, CT showed enlarging hematoma associate w/ left kidney. Underwent left renal angiogram and coil embolization. S/p post IR guided angiogram and embolization bleeding artery 9/8 patient stablized. Hemoglobin is stabilized. Warfarin resumed. Remains on heparin infusion until INR therapeutic then anticipate discharge home.   A & P  Retroperitoneal hemorrhage --spontaneous --Treated with 6 units PRBC, vitamin K and 2 units FFP --s/p embolization. --Hemoglobin stable --Monitor clinically.  End-stage renal disease on hemodialysis [facility]) --Continue management per nephrology.  S/P mitral valve replacement with metallic valve --Continue heparin infusion until INR therapeutic. Goal 2.5-3.5. Continue warfarin per pharmacy. --f/u with warfarin clinic  Status post mechanical aortic valve replacement --Continue heparin infusion until INR therapeutic. Continue warfarin per pharmacy.  Urethral bleeding --minimal w/ no change in CBC --likely old blood; minimal w/ stable Hgb, expect will spontaneously resolve --can f/u w/ urology as an outpatient  Hemorrhagic shock ([place]) -admitted by PCCM --secondary to spontaneous retroperitoneal bleed, ABLA in setting of supra-therapeutic INR --IR was consulted in ED but at that time was not a  candidate for intervention.  Aortic atherosclerosis (HCC) --no inpt treatment --consider statin as outpt  Disposition Plan:  Discussion: continue heparin until INR 2.5.  Status is: Inpatient  Remains inpatient appropriate because:IV treatments appropriate due to intensity of illness or inability to take PO   Dispo: The patient is from: Home              Anticipated d/c is to: Home              Anticipated d/c date is: 2 days              Patient currently is not medically stable to d/c.  DVT prophylaxis: heparin infusion SCDs Start: 01/06/20 1944 Code Status: Full Family Communication: none  [name], MD  [facility] Direct contact: see [email or web address] (further directions at bottom of note if needed) 7PM-7AM contact night coverage as at bottom of note 02/07/2020, 1:33 PM  LOS: 32 days    Consults:   PCCM admitted  General surgery  Nephrology  Cardiology  Urology  IR   Interval History/Subjective  CC f/u INR Feels fine, no new complaints.  Objective   Vitals:  Vitals:   02/07/20 0531 02/07/20 0836  BP: (!) 152/84 (!) 179/92  Pulse: 90 96  Resp: 16 20  Temp: 99.1 F (37.3 C) 98.5 F (36.9 C)  SpO2: 100% 99%    Exam:  Physical Exam Vitals reviewed.  Cardiovascular:     Rate and Rhythm: Normal rate and regular rhythm.     Comments: Mechanical clicks noted Pulmonary:     Effort: Pulmonary effort is normal.     Breath sounds: Normal breath sounds.  Musculoskeletal:     Right lower leg: No edema.     Left lower leg: No edema.  Neurological:     Mental Status: She is alert.  Psychiatric:        Mood and Affect: Mood normal.        Behavior: Behavior normal.    I have personally reviewed the following:   Today's Data   INR pending (staffing shortage)  Scheduled Meds:  sodium chloride   Intravenous Once   sodium chloride   Intravenous Once   calcitRIOL  2 mcg Oral Q M,W,F   Chlorhexidine Gluconate Cloth  6 each Topical Q0600    [START ON 02/08/2020] cinacalcet  120 mg Oral Q M,W,F-HD   darbepoetin (ARANESP) injection - DIALYSIS  60 mcg Intravenous Q Mon-HD   docusate sodium  100 mg Oral Daily   ferric citrate  210 mg Oral TID WC   metoprolol tartrate  25 mg Oral BID   multivitamin  1 tablet Oral QHS   pantoprazole  40 mg Oral QAC breakfast   polyethylene glycol  17 g Oral BID   senna  1 tablet Oral Daily   simethicone  80 mg Oral TID   Warfarin - Pharmacist Dosing Inpatient   Does not apply q1600   Continuous Infusions:  sodium chloride     heparin 1,250 Units/hr (02/07/20 0027)    Principal Problem:   Retroperitoneal hemorrhage Active Problems:   End-stage renal disease on hemodialysis (HCC)   S/P mitral valve replacement with metallic valve   Status post mechanical aortic valve replacement   Nontraumatic retroperitoneal hematoma   Urethral bleeding   Hemorrhagic shock ([place])   Aortic atherosclerosis ([place])   LOS: 32 days   How to contact the [facility] Attending or Consulting provider 7A - 7P or covering provider during after hours [place], for this patient?  1. Check the care team in [facility] and look for a) attending/consulting TRH provider listed and b) the [facility] team listed 2. Log into [email or web address] and use [place] universal password to access. If you do not have the password, please contact the hospital operator. 3. Locate the [facility] provider you are looking for under [facility] and page to a number that you can be directly reached. 4. If you still have difficulty reaching the provider, please page the [facility] (Director on Call) for the Hospitalists listed on amion for assistance.

## 2020-02-07 NOTE — Progress Notes (Signed)
Nephrology Follow-Up Consult note   Assessment/Recommendations: [name] is a/an 59 y.o. female with a past medical history significant for ESRD, aortic and mitral valve replacement on Coumadin, admitted for retroperitoneal bleed.     OP HD:MWF  4h 61kg 450/800 2/2 bath P2 Hep 1800 -sensipar 180mg  -calcitriol 61mcg -no esa, no iron   1.Lretroperitonealperirenal bleed/hematoma - Bleed seen on CT 8/18.  Status post multiple blood products requiring IV heparin for bridge given history of valve replacement.  Now status post renal embolization with VIR.  Hemoglobin has stabilized - coumadin restarted 9/10, continues on IV heparin, INR rising appropriately, nearing goal of 2.5 3.Hx AVR/MVR-anticoagulation as above 4. ESRD- on HD MWF. HD per schedule.  No issues on dialysis today. 5. Anemiaof CKD- As above S/p 6 units pRBC. TSAT 84% 8/30.  Aranesp 8mcg qweekly started on 9/13. Consider increasing if needed 6. Secondary hyperparathyroidism- PTH 109 (has been here 30 days). Cont calcitriol. sensipar decreased 180->120mg  three times weekly. Continue decreased dose of Auryxia 210 mg 3 times daily.  7.HTN/volume-[name] 60.5kg.  Overall stable.  Blood pressure acceptable 8. Nutrition- Renal diet w/fluid restrictions. Alb 2.8. Protein supplements.     Recommendations conveyed to primary service.    [facility] 02/07/2020 8:38 AM  ___________________________________________________________  CC: ESRD  Interval History/Subjective: Patient feels well with no complaints today.   Medications:  Current Facility-Administered Medications  Medication Dose Route Frequency Provider Last Rate Last Admin  . 0.9 %  sodium chloride infusion (Manually program via Guardrails IV Fluids)   Intravenous Once Zierle-Ghosh, Asia B, DO      . 0.9 %  sodium chloride infusion (Manually program via Guardrails IV Fluids)   Intravenous Once Shawna Clamp, MD      . 0.9 %  sodium chloride infusion   Intravenous PRN Charlynne Cousins, MD      . acetaminophen (TYLENOL) tablet 650 mg  650 mg Oral Q6H PRN Charlynne Cousins, MD   650 mg at 02/06/20 0544  . calcitRIOL (ROCALTROL) capsule 2 mcg  2 mcg Oral Q M,W,F Gean Quint, MD   2 mcg at 02/05/20 1146  . Chlorhexidine Gluconate Cloth 2 % PADS 6 each  6 each Topical Q0600 Samuella Cota, MD   6 each at 02/03/20 0703  . [START ON 02/08/2020] cinacalcet (SENSIPAR) tablet 120 mg  120 mg Oral Q M,W,F-HD Reesa Chew, MD      . Darbepoetin Alfa (ARANESP) injection 60 mcg  60 mcg Intravenous Q Mon-HD Reesa Chew, MD   60 mcg at 02/01/20 1038  . docusate sodium (COLACE) capsule 100 mg  100 mg Oral Daily Bowser, Laurel Dimmer, NP   100 mg at 02/06/20 0950  . ferric citrate (AURYXIA) tablet 210 mg  210 mg Oral TID WC Reesa Chew, MD   210 mg at 02/06/20 1720  . heparin ADULT infusion 100 units/mL (25000 units/21mL sodium chloride 0.45%)  1,250 Units/hr Intravenous Continuous Bertis Ruddy, RPH 12.5 mL/hr at 02/07/20 0027 1,250 Units/hr at 02/07/20 0027  . hydrALAZINE (APRESOLINE) injection 10 mg  10 mg Intravenous Q6H PRN Debbe Odea, MD   10 mg at 01/24/20 1153  . hydrocortisone cream 1 %   Topical QID PRN Shalhoub, Sherryll Burger, MD   Given at 02/01/20 0230  . iohexol (OMNIPAQUE) 300 MG/ML solution 150 mL  150 mL Intra-arterial Once PRN Jacqulynn Cadet, MD      . metoprolol tartrate (LOPRESSOR) tablet 25 mg  25 mg Oral BID Fay Records, MD   25 mg at 02/06/20 2018  . multivitamin (RENA-VIT) tablet 1 tablet  1 tablet Oral QHS Roney Jaffe, MD   1 tablet at 02/06/20 2018  . ondansetron (ZOFRAN) injection 4 mg  4 mg Intravenous Q6H PRN Debbe Odea, MD   4 mg at 02/05/20 2041  . ondansetron (ZOFRAN-ODT) disintegrating tablet 4 mg  4 mg Oral Q8H PRN Icard, Bradley L, DO   4 mg at 01/18/20 1818  . oxyCODONE (Oxy IR/ROXICODONE) immediate release tablet 10 mg  10 mg Oral Q4H PRN  Debbe Odea, MD   10 mg at 02/06/20 2018  . pantoprazole (PROTONIX) EC tablet 40 mg  40 mg Oral QAC breakfast Donnamae Jude, RPH   40 mg at 02/06/20 0950  . polyethylene glycol (MIRALAX / GLYCOLAX) packet 17 g  17 g Oral BID Charlynne Cousins, MD   17 g at 02/05/20 1253  . senna (SENOKOT) tablet 8.6 mg  1 tablet Oral Daily Bowser, Grace E, NP   8.6 mg at 02/06/20 0951  . simethicone (MYLICON) chewable tablet 80 mg  80 mg Oral TID Cristal Generous, NP   80 mg at 02/06/20 2018  . Warfarin - Pharmacist Dosing Inpatient   Does not apply q1600 Karren Cobble, [facility]   Given at 02/05/20 1642      Review of Systems: 10 systems reviewed and negative except per interval history/subjective  Physical Exam: Vitals:   02/07/20 0531 02/07/20 0836  BP: (!) 152/84 (!) 179/92  Pulse: 90 96  Resp: 16 20  Temp: 99.1 F (37.3 C) 98.5 F (36.9 C)  SpO2: 100% 99%   No intake/output data recorded.  Intake/Output Summary (Last 24 hours) at 02/07/2020 0838 Last data filed at 02/06/2020 0900 Gross per 24 hour  Intake 120 ml  Output --  Net 120 ml   Constitutional: well-appearing, no acute distress ENMT: ears and nose without scars or lesions, MMM CV: normal rate, no edema Respiratory: bilateral chest rise, normal work of breathing Gastrointestinal: soft, NABS Skin: no visible lesions or rashes Psych: alert, judgement/insight appropriate, appropriate mood and affect   Test Results I personally reviewed new and old clinical labs and radiology tests Lab Results  Component Value Date   NA 133 (L) 02/06/2020   K 4.0 02/06/2020   CL 96 (L) 02/06/2020   CO2 25 02/06/2020   BUN 20 02/06/2020   CREATININE 4.49 (H) 02/06/2020   CALCIUM 9.2 02/06/2020   ALBUMIN 2.8 (L) 02/06/2020   PHOS 3.2 02/06/2020

## 2020-02-07 NOTE — Progress Notes (Signed)
[place] for heparin, warfarin Indication: mechanical AVR/MVR  No Known Allergies  Patient Measurements: Height: 5\' 6"  (167.6 cm) Weight: 28.6 kg (63 lb 1.6 oz) (A) IBW/kg (Calculated) : 59.3 Heparin Dosing Weight: 62kg  Vital Signs: Temp: 98.4 F (36.9 C) (09/19 1335) Temp Source: Oral (09/19 1335) BP: 140/93 (09/19 1705) Pulse Rate: 93 (09/19 1705)  Labs: Recent Labs    02/05/20 0259 02/05/20 0259 02/06/20 0604 02/07/20 1252 02/07/20 1820  HGB 8.4*  --  8.8*  --   --   HCT 26.9*  --  29.1*  --   --   PLT 320  --  305  --   --   LABPROT 22.6*  --  22.0* 21.4*  --   INR 2.1*  --  2.0* 1.9*  --   HEPARINUNFRC 0.30   < > 0.46 0.55 0.42  CREATININE 6.01*  --  4.49* 7.18*  --    < > = values in this interval not displayed.    Estimated Creatinine Clearance: 3.9 mL/min (A) (by C-G formula based on SCr of 7.18 mg/dL (H)).   Assessment: 60 yo W on warfarin PTA for hx mechanical AVR/MVR (goal 2.5-3.5), reversed with vit K 10 mg IV + 2 units FFP  + 4 units PRBcs on 8/18 and another vit K po 5mg  on 9/7 with 2 unit RBCs 9/8-9/9 due to continued retroperitoneal bleed.  PTA Warfarin regimen: 9 mg daily except 6 mg on Mondays - last 8/18 (INR was 3.6 on admit 8/18)    Heparin level 0.42 at goal on heparin drip 1200 uts/hr. INR subtherapeutic at 1.9. Recent CBC stable and minor urethral bleeding noted. Pharmacy asked to continue anticoagulation.  Goal of Therapy:  Heparin level 0.3 -0.5 units/ml (low goal due to bleed) INR 2.5- 3.5  Monitor platelets by anticoagulation protocol: Yes   Plan:  Continue heparin gtt 1200 units/hr Daily heparin level, INR, CBC, s/s bleeding  Bonnita Nasuti Pharm.D. CPP, BCPS Clinical Pharmacist [phone] 02/07/2020 7:34 PM    Please check AMION for all [place] numbers

## 2020-02-07 NOTE — Plan of Care (Signed)
  Problem: Health Behavior/Discharge Planning: Goal: Ability to manage health-related needs will improve Outcome: Progressing   Problem: Clinical Measurements: Goal: Ability to maintain clinical measurements within normal limits will improve Outcome: Progressing   Problem: Activity: Goal: Risk for activity intolerance will decrease Outcome: Progressing   

## 2020-02-07 NOTE — Progress Notes (Addendum)
[place] for heparin, warfarin Indication: mechanical AVR/MVR  No Known Allergies  Patient Measurements: Height: 5\' 6"  (167.6 cm) Weight: 28.6 kg (63 lb 1.6 oz) (A) IBW/kg (Calculated) : 59.3 Heparin Dosing Weight: 62kg  Vital Signs: Temp: 98.4 F (36.9 C) (09/19 1335) Temp Source: Oral (09/19 1335) BP: 175/88 (09/19 1335) Pulse Rate: 88 (09/19 1335)  Labs: Recent Labs    02/05/20 0259 02/06/20 0604 02/07/20 1252  HGB 8.4* 8.8*  --   HCT 26.9* 29.1*  --   PLT 320 305  --   LABPROT 22.6* 22.0* 21.4*  INR 2.1* 2.0* 1.9*  HEPARINUNFRC 0.30 0.46 0.55  CREATININE 6.01* 4.49* 7.18*    Estimated Creatinine Clearance: 3.9 mL/min (A) (by C-G formula based on SCr of 7.18 mg/dL (H)).   Assessment: 59 yo W on warfarin PTA for hx mechanical AVR/MVR (goal 2.5-3.5), reversed with vit K 10 mg IV + 2 units FFP  + 4 units PRBcs on 8/18 and another vit K po 5mg  on 9/7 with 2 unit RBCs 9/8-9/9 due to continued retroperitoneal bleed.  PTA Warfarin regimen: 9 mg daily except 6 mg on Mondays - last 8/18 (INR was 3.6 on admit 8/18)    Heparin level today supratherapeutic. INR subtherapeutic at 1.9. Recent CBC stable and minor urethral bleeding noted. Pharmacy asked to continue anticoagulation.  Goal of Therapy:  Heparin level 0.3 -0.5 units/ml (low goal due to bleed) INR 2.5- 3.5  Monitor platelets by anticoagulation protocol: Yes   Plan:  Decrease heparin gtt 1200 units/hr Warfarin 12 mg PO x 1 today 6hr heparin level Daily heparin level, INR, CBC, s/s bleeding  Norina Buzzard, PharmD PGY1 Pharmacy Resident 02/07/2020 1:59 PM  Please check AMION for all [place] numbers

## 2020-02-08 DIAGNOSIS — D6869 Other thrombophilia: Secondary | ICD-10-CM

## 2020-02-08 LAB — RENAL FUNCTION PANEL
Albumin: 2.5 g/dL — ABNORMAL LOW (ref 3.5–5.0)
Anion gap: 12 (ref 5–15)
BUN: 47 mg/dL — ABNORMAL HIGH (ref 6–20)
CO2: 24 mmol/L (ref 22–32)
Calcium: 9.6 mg/dL (ref 8.9–10.3)
Chloride: 92 mmol/L — ABNORMAL LOW (ref 98–111)
Creatinine, Ser: 8.25 mg/dL — ABNORMAL HIGH (ref 0.44–1.00)
GFR calc Af Amer: 6 mL/min — ABNORMAL LOW (ref >60)
GFR calc non Af Amer: 5 mL/min — ABNORMAL LOW (ref >60)
Glucose, Bld: 91 mg/dL (ref 70–99)
Phosphorus: 4.3 mg/dL (ref 2.5–4.6)
Potassium: 4.4 mmol/L (ref 3.5–5.1)
Sodium: 128 mmol/L — ABNORMAL LOW (ref 135–145)

## 2020-02-08 LAB — PROTIME-INR
INR: 2.1 — ABNORMAL HIGH (ref 0.8–1.2)
Prothrombin Time: 23.1 seconds — ABNORMAL HIGH (ref 11.4–15.2)

## 2020-02-08 LAB — CBC
HCT: 27 % — ABNORMAL LOW (ref 36.0–46.0)
Hemoglobin: 8.6 g/dL — ABNORMAL LOW (ref 12.0–15.0)
MCH: 32.3 pg (ref 26.0–34.0)
MCHC: 31.9 g/dL (ref 30.0–36.0)
MCV: 101.5 fL — ABNORMAL HIGH (ref 80.0–100.0)
Platelets: 296 10*3/uL (ref 150–400)
RBC: 2.66 MIL/uL — ABNORMAL LOW (ref 3.87–5.11)
RDW: 17.6 % — ABNORMAL HIGH (ref 11.5–15.5)
WBC: 11.4 10*3/uL — ABNORMAL HIGH (ref 4.0–10.5)
nRBC: 0.4 % — ABNORMAL HIGH (ref 0.0–0.2)

## 2020-02-08 LAB — HEPARIN LEVEL (UNFRACTIONATED): Heparin Unfractionated: 0.33 IU/mL (ref 0.30–0.70)

## 2020-02-08 MED ORDER — DARBEPOETIN ALFA 60 MCG/0.3ML IJ SOSY
PREFILLED_SYRINGE | INTRAMUSCULAR | Status: AC
  Administered 2020-02-08: 60 ug via INTRAVENOUS
  Filled 2020-02-08: qty 0.3

## 2020-02-08 MED ORDER — WARFARIN SODIUM 3 MG PO TABS
6.0000 mg | ORAL_TABLET | Freq: Once | ORAL | Status: AC
  Administered 2020-02-08: 6 mg via ORAL
  Filled 2020-02-08: qty 2

## 2020-02-08 MED ORDER — CALCITRIOL 0.5 MCG PO CAPS
ORAL_CAPSULE | ORAL | Status: AC
  Filled 2020-02-08: qty 4

## 2020-02-08 NOTE — Progress Notes (Signed)
PROGRESS NOTE  [name] JOI:[id number] DOB: [birth year] DOA: 01/06/2020 PCP: Benito Mccreedy, MD  Brief History   59 year old woman PMH including ESRD, status post aortic and mitral valve replacement on warfarin, presented with flank pain; CT revealed large spontaneous left retroperitoneal hematoma, INR was 3.6. seen by surgery, cardiology, PCCM, nephrology and urology. Required total 6 units PRBCs, 2 units FFP and vitamin K. About 10 days in pt developed gross hematuria, imaging nondiagnostic for renal mass. Attending discussed w/ urology; given negative imaging rec was to followup as outpat w/ urology to further investigate. Developed flank pain about 2 weeks in, CT showed enlarging hematoma associate w/ left kidney. Underwent left renal angiogram and coil embolization. S/p post IR guided angiogram and embolization bleeding artery 9/8 patient stablized. Hemoglobin is stabilized. Warfarin resumed. Remains on heparin infusion until INR therapeutic then anticipate discharge home.   A & P  Retroperitoneal hemorrhage --spontaneous --Treated with 6 units PRBC, vitamin K and 2 units FFP --s/p embolization. --Hemoglobin stable --Monitor clinically.  End-stage renal disease on hemodialysis [facility]) --Continue management per nephrology.  S/P mitral valve replacement with metallic valve --Continue heparin infusion until INR therapeutic. Goal 2.5-3.5. Continue warfarin per pharmacy. --f/u with warfarin clinic  Status post mechanical aortic valve replacement --Continue heparin infusion until INR therapeutic. Continue warfarin per pharmacy.  Urethral bleeding --minimal w/ no change in CBC --likely old blood; minimal w/ stable Hgb, expect will spontaneously resolve --can f/u w/ urology as an outpatient  Hemorrhagic shock ([place]) -admitted by PCCM --secondary to spontaneous retroperitoneal bleed, ABLA in setting of supra-therapeutic INR --IR was consulted in ED but at that time was not a  candidate for intervention.  Aortic atherosclerosis (HCC) --no inpt treatment --consider statin as outpt  Acquired thrombophilia ([place]) --secondary to mechanical AV and MV --heparin until warfarin therapeutic  Disposition Plan:  Discussion: continue heparin until INR 2.5.  Status is: Inpatient  Remains inpatient appropriate because:IV treatments appropriate due to intensity of illness or inability to take PO   Dispo: The patient is from: Home              Anticipated d/c is to: Home              Anticipated d/c date is: 2 days              Patient currently is not medically stable to d/c.  DVT prophylaxis: heparin infusion SCDs Start: 01/06/20 1944 Code Status: Full Family Communication: none  [name], MD  [facility] Direct contact: see [email or web address] (further directions at bottom of note if needed) 7PM-7AM contact night coverage as at bottom of note 02/08/2020, 3:04 PM  LOS: 33 days    Consults:  . PCCM admitted . General surgery . Nephrology . Cardiology . Urology . IR   Interval History/Subjective  CC f/u INR No new issues reported  Objective   Vitals:  Vitals:   02/08/20 1240 02/08/20 1337  BP: 138/77 134/69  Pulse: 95 (!) 102  Resp: 18   Temp: 98.3 F (36.8 C) 98.4 F (36.9 C)  SpO2: 96% 99%    Exam:  Physical Exam Vitals reviewed.  Cardiovascular:     Rate and Rhythm: Normal rate and regular rhythm.     Comments: Mechanical clicks noted Pulmonary:     Effort: Pulmonary effort is normal.     Breath sounds: Normal breath sounds.  Musculoskeletal:     Right lower leg: No edema.     Left lower leg: No edema.  Neurological:     Mental Status: She is alert.  Psychiatric:        Mood and Affect: Mood normal.        Behavior: Behavior normal.    I have personally reviewed the following:   Today's Data  . INR 2.1  Scheduled Meds: . sodium chloride   Intravenous Once  . sodium chloride   Intravenous Once  . calcitRIOL  2 mcg  Oral Q M,W,F  . Chlorhexidine Gluconate Cloth  6 each Topical Q0600  . cinacalcet  120 mg Oral Q M,W,F-HD  . darbepoetin (ARANESP) injection - DIALYSIS  60 mcg Intravenous Q Mon-HD  . docusate sodium  100 mg Oral Daily  . ferric citrate  210 mg Oral TID WC  . metoprolol tartrate  25 mg Oral BID  . multivitamin  1 tablet Oral QHS  . pantoprazole  40 mg Oral QAC breakfast  . polyethylene glycol  17 g Oral BID  . senna  1 tablet Oral Daily  . simethicone  80 mg Oral TID  . warfarin  6 mg Oral ONCE-1600  . Warfarin - Pharmacist Dosing Inpatient   Does not apply q1600   Continuous Infusions: . sodium chloride    . heparin 1,200 Units/hr (02/07/20 1952)    Principal Problem:   Retroperitoneal hemorrhage Active Problems:   End-stage renal disease on hemodialysis (HCC)   S/P mitral valve replacement with metallic valve   Status post mechanical aortic valve replacement   Nontraumatic retroperitoneal hematoma   Acquired thrombophilia ([place])   Urethral bleeding   Hemorrhagic shock ([place])   Aortic atherosclerosis ([place])   LOS: 33 days   How to contact the [facility] Attending or Consulting provider [place] or covering provider during after hours [place], for this patient?  1. Check the care team in [facility] and look for a) attending/consulting TRH provider listed and b) the [facility] team listed 2. Log into [email or web address] and use [place]'s universal password to access. If you do not have the password, please contact the hospital operator. 3. Locate the [facility] provider you are looking for under [facility] and page to a number that you can be directly reached. 4. If you still have difficulty reaching the provider, please page the [place] (Director on Call) for the Hospitalists listed on amion for assistance.

## 2020-02-08 NOTE — Progress Notes (Signed)
PT Cancellation Note  Patient Details Name: Izola Teague MRN: [id number] DOB: Nov 30, 1960   Cancelled Treatment:    Reason Eval/Treat Not Completed: Patient at procedure or test/unavailable (Pt in HD.  Will check back at later time/date. )   Denice Paradise 02/08/2020, 9:40 AM Nataley Bahri W,PT Acute Rehabilitation Services Pager:  [phone]  Office:  [phone]

## 2020-02-08 NOTE — Progress Notes (Signed)
Nephrology Follow-Up Consult note   Assessment/Recommendations: [name] is a/an 59 y.o. female with a past medical history significant for ESRD, aortic and mitral valve replacement on Coumadin, admitted for retroperitoneal bleed.     OP HD:MWF  4h 61kg 450/800 2/2 bath P2 Hep 1800 -sensipar 180mg  -calcitriol 7mcg -no esa, no iron   1.Lretroperitonealperirenal bleed/hematoma - Bleed seen on CT 8/18.  Status post multiple blood products requiring IV heparin for bridge given history of valve replacement.  Now status post renal embolization with VIR.  Hemoglobin has stabilized - coumadin restarted 9/10, continues on IV heparin, INR rising appropriately, nearing goal of 2.5 3.Hx AVR/MVR-anticoagulation as above 4. ESRD- on HD MWF. HD per schedule.  No issues on dialysis today. 5. Anemiaof CKD- As above S/p 6 units pRBC. TSAT 84% 8/30.  Aranesp 55mcg qweekly started on 9/13. Consider increasing if needed 6. Secondary hyperparathyroidism- PTH 109 (has been here 30 days). Cont calcitriol. sensipar decreased 180->120mg  three times weekly. Continue decreased dose of Auryxia 210 mg 3 times daily.  7.HTN/volume-[name] 60.5kg.  Overall stable.  Blood pressure acceptable 8. Nutrition- Renal diet w/fluid restrictions. Alb 2.8. Protein supplements.     [facility] 02/08/2020 10:12 AM  ___________________________________________________________  CC: ESRD  Interval History/Subjective: Patient feels well with no complaints today. Seen on HD   Medications:  Current Facility-Administered Medications  Medication Dose Route Frequency Provider Last Rate Last Admin  . 0.9 %  sodium chloride infusion (Manually program via Guardrails IV Fluids)   Intravenous Once Zierle-Ghosh, Asia B, DO      . 0.9 %  sodium chloride infusion (Manually program via Guardrails IV Fluids)   Intravenous Once Shawna Clamp, MD      . 0.9 %  sodium  chloride infusion   Intravenous PRN Charlynne Cousins, MD      . acetaminophen (TYLENOL) tablet 650 mg  650 mg Oral Q6H PRN Charlynne Cousins, MD   650 mg at 02/08/20 0543  . calcitRIOL (ROCALTROL) capsule 2 mcg  2 mcg Oral Q M,W,F Gean Quint, MD   2 mcg at 02/05/20 1146  . Chlorhexidine Gluconate Cloth 2 % PADS 6 each  6 each Topical Q0600 Samuella Cota, MD   6 each at 02/08/20 [phone]  . cinacalcet (SENSIPAR) tablet 120 mg  120 mg Oral Q M,W,F-HD Reesa Chew, MD      . Darbepoetin Alfa (ARANESP) injection 60 mcg  60 mcg Intravenous Q Mon-HD Reesa Chew, MD   60 mcg at 02/01/20 1038  . docusate sodium (COLACE) capsule 100 mg  100 mg Oral Daily Bowser, Grace E, NP   100 mg at 02/07/20 0840  . ferric citrate (AURYXIA) tablet 210 mg  210 mg Oral TID WC Reesa Chew, MD   210 mg at 02/07/20 1702  . heparin ADULT infusion 100 units/mL (25000 units/284mL sodium chloride 0.45%)  1,200 Units/hr Intravenous Continuous Cala Bradford, RPH 12 mL/hr at 02/07/20 1952 1,200 Units/hr at 02/07/20 1952  . hydrALAZINE (APRESOLINE) injection 10 mg  10 mg Intravenous Q6H PRN Debbe Odea, MD   10 mg at 01/24/20 1153  . hydrocortisone cream 1 %   Topical QID PRN Shalhoub, Sherryll Burger, MD   Given at 02/01/20 0230  . iohexol (OMNIPAQUE) 300 MG/ML solution 150 mL  150 mL Intra-arterial Once PRN Jacqulynn Cadet, MD      . metoprolol tartrate (LOPRESSOR) tablet 25 mg  25 mg Oral BID Fay Records,  MD   25 mg at 02/07/20 2102  . multivitamin (RENA-VIT) tablet 1 tablet  1 tablet Oral QHS Roney Jaffe, MD   1 tablet at 02/07/20 2102  . ondansetron (ZOFRAN) injection 4 mg  4 mg Intravenous Q6H PRN Debbe Odea, MD   4 mg at 02/05/20 2041  . ondansetron (ZOFRAN-ODT) disintegrating tablet 4 mg  4 mg Oral Q8H PRN Icard, Bradley L, DO   4 mg at 01/18/20 1818  . oxyCODONE (Oxy IR/ROXICODONE) immediate release tablet 10 mg  10 mg Oral Q4H PRN Debbe Odea, MD   10 mg at 02/07/20 2102  . pantoprazole  (PROTONIX) EC tablet 40 mg  40 mg Oral QAC breakfast Donnamae Jude, RPH   40 mg at 02/07/20 0839  . polyethylene glycol (MIRALAX / GLYCOLAX) packet 17 g  17 g Oral BID Charlynne Cousins, MD   17 g at 02/07/20 2107  . senna (SENOKOT) tablet 8.6 mg  1 tablet Oral Daily Bowser, Grace E, NP   8.6 mg at 02/07/20 0839  . simethicone (MYLICON) chewable tablet 80 mg  80 mg Oral TID Cristal Generous, NP   80 mg at 02/07/20 2102  . warfarin (COUMADIN) tablet 6 mg  6 mg Oral ONCE-1600 Samuella Cota, MD      . Warfarin - Pharmacist Dosing Inpatient   Does not apply q1600 Karren Cobble, [facility]   Given at 02/05/20 1642      Review of Systems: 10 systems reviewed and negative except per interval history/subjective  Physical Exam: Vitals:   02/08/20 0930 02/08/20 1000  BP: (!) 153/112 (!) 141/72  Pulse: 90 94  Resp:    Temp:    SpO2:     Total I/O In: 308.6 [P.O.:200; I.V.:108.6] Out: -   Intake/Output Summary (Last 24 hours) at 02/08/2020 1012 Last data filed at 02/08/2020 0845 Gross per 24 hour  Intake 1441.43 ml  Output 0 ml  Net 1441.43 ml   Constitutional: well-appearing, no acute distress ENMT: ears and nose without scars or lesions, MMM CV: normal rate, no edema Respiratory: bilateral chest rise, normal work of breathing Gastrointestinal: soft, NABS Skin: no visible lesions or rashes Psych: alert, judgement/insight appropriate, appropriate mood and affect   Test Results I personally reviewed new and old clinical labs and radiology tests Lab Results  Component Value Date   NA 128 (L) 02/08/2020   K 4.4 02/08/2020   CL 92 (L) 02/08/2020   CO2 24 02/08/2020   BUN 47 (H) 02/08/2020   CREATININE 8.25 (H) 02/08/2020   CALCIUM 9.6 02/08/2020   ALBUMIN 2.5 (L) 02/08/2020   PHOS 4.3 02/08/2020

## 2020-02-08 NOTE — Progress Notes (Signed)
[place] for heparin, warfarin Indication: mechanical AVR/MVR  No Known Allergies  Patient Measurements: Height: 5\' 6"  (167.6 cm) Weight: 64.1 kg (141 lb 5 oz) IBW/kg (Calculated) : 59.3 Heparin Dosing Weight: 62kg  Vital Signs: Temp: 98.1 F (36.7 C) (09/20 0441) Temp Source: Oral (09/20 0441) BP: 139/72 (09/20 0441) Pulse Rate: 92 (09/20 0441)  Labs: Recent Labs    02/06/20 0604 02/06/20 0604 02/07/20 1252 02/07/20 1820 02/08/20 0410 02/08/20 0412  HGB 8.8*  --   --   --  8.6*  --   HCT 29.1*  --   --   --  27.0*  --   PLT 305  --   --   --  296  --   LABPROT 22.0*  --  21.4*  --  23.1*  --   INR 2.0*  --  1.9*  --  2.1*  --   HEPARINUNFRC 0.46   < > 0.55 0.42  --  0.33  CREATININE 4.49*  --  7.18*  --  8.25*  --    < > = values in this interval not displayed.    Estimated Creatinine Clearance: 7 mL/min (A) (by C-G formula based on SCr of 8.25 mg/dL (H)).   Assessment: 59 yo W on warfarin PTA for hx mechanical AVR/MVR (goal 2.5-3.5), reversed with vit K 10 mg IV + 2 units FFP  + 4 units PRBcs on 8/18 and another vit K po 5mg  on 9/7 with 2 unit RBCs 9/8-9/9 due to continued retroperitoneal bleed.  PTA Warfarin regimen: 9 mg daily except 6 mg on Mondays - last 8/18 (INR was 3.6 on admit 8/18)    Heparin level is at goal at 0.33, on 1200 units/hr. INR is still subtherapeutic at 2.1. Hgb stable at 8.6, plt 296. No s/sx of bleeding or infusion issues. Pharmacy asked to continue anticoagulation.  Goal of Therapy:  Heparin level 0.3 -0.5 units/ml (low goal due to bleed) INR 2.5-3.5  Monitor platelets by anticoagulation protocol: Yes   Plan:  Continue heparin gtt at 1200 units/hr Warfarin 6 mg tonight - anticipate INR movement given recently higher doses  Daily heparin level, INR, CBC, s/s bleeding  Antonietta Jewel, PharmD, [place] Clinical Pharmacist  Phone: [phone] 02/08/2020 7:56 AM  Please check AMION for all [place]  phone numbers After 10:00 PM, call [place] [phone]

## 2020-02-09 LAB — RENAL FUNCTION PANEL
Albumin: 2.7 g/dL — ABNORMAL LOW (ref 3.5–5.0)
Anion gap: 10 (ref 5–15)
BUN: 20 mg/dL (ref 6–20)
CO2: 27 mmol/L (ref 22–32)
Calcium: 9.9 mg/dL (ref 8.9–10.3)
Chloride: 95 mmol/L — ABNORMAL LOW (ref 98–111)
Creatinine, Ser: 4.49 mg/dL — ABNORMAL HIGH (ref 0.44–1.00)
GFR calc Af Amer: 12 mL/min — ABNORMAL LOW (ref >60)
GFR calc non Af Amer: 10 mL/min — ABNORMAL LOW (ref >60)
Glucose, Bld: 101 mg/dL — ABNORMAL HIGH (ref 70–99)
Phosphorus: 3.8 mg/dL (ref 2.5–4.6)
Potassium: 4.2 mmol/L (ref 3.5–5.1)
Sodium: 132 mmol/L — ABNORMAL LOW (ref 135–145)

## 2020-02-09 LAB — CBC
HCT: 28.8 % — ABNORMAL LOW (ref 36.0–46.0)
Hemoglobin: 9 g/dL — ABNORMAL LOW (ref 12.0–15.0)
MCH: 32.4 pg (ref 26.0–34.0)
MCHC: 31.3 g/dL (ref 30.0–36.0)
MCV: 103.6 fL — ABNORMAL HIGH (ref 80.0–100.0)
Platelets: 280 10*3/uL (ref 150–400)
RBC: 2.78 MIL/uL — ABNORMAL LOW (ref 3.87–5.11)
RDW: 18.6 % — ABNORMAL HIGH (ref 11.5–15.5)
WBC: 10 10*3/uL (ref 4.0–10.5)
nRBC: 0.6 % — ABNORMAL HIGH (ref 0.0–0.2)

## 2020-02-09 LAB — PROTIME-INR
INR: 2.2 — ABNORMAL HIGH (ref 0.8–1.2)
Prothrombin Time: 24 seconds — ABNORMAL HIGH (ref 11.4–15.2)

## 2020-02-09 LAB — HEPARIN LEVEL (UNFRACTIONATED): Heparin Unfractionated: 0.68 IU/mL (ref 0.30–0.70)

## 2020-02-09 MED ORDER — WARFARIN SODIUM 3 MG PO TABS
9.0000 mg | ORAL_TABLET | Freq: Once | ORAL | Status: AC
  Administered 2020-02-09: 9 mg via ORAL
  Filled 2020-02-09: qty 3

## 2020-02-09 NOTE — Progress Notes (Signed)
ANTICOAGULATION CONSULT NOTE - Follow Up Consult  Pharmacy Consult for Heparin, Coumadin Indication: Mechanical heart valves  No Known Allergies  Patient Measurements: Height: 5\' 6"  (167.6 cm) Weight: 62.1 kg (136 lb 14.4 oz) IBW/kg (Calculated) : 59.3 Heparin Dosing Weight:  62.1 kg  Vital Signs: Temp: 98.6 F (37 C) (09/21 0340) Temp Source: Oral (09/21 0340) BP: 121/79 (09/21 0340) Pulse Rate: 89 (09/21 0340)  Labs: Recent Labs    02/07/20 1252 02/07/20 1252 02/07/20 1820 02/08/20 0410 02/08/20 0412 02/09/20 0358  HGB  --   --   --  8.6*  --  9.0*  HCT  --   --   --  27.0*  --  28.8*  PLT  --   --   --  296  --  280  LABPROT 21.4*  --   --  23.1*  --  24.0*  INR 1.9*  --   --  2.1*  --  2.2*  HEPARINUNFRC 0.55   < > 0.42  --  0.33 0.68  CREATININE 7.18*  --   --  8.25*  --  4.49*   < > = values in this interval not displayed.    Estimated Creatinine Clearance: 12.9 mL/min (A) (by C-G formula based on SCr of 4.49 mg/dL (H)).   Assessment: Anticoag: s/p RP hematoma per CT 8/18. Warfarin PTA for hx mech AVR/MVR (goal 2.5-3.5) reversed with vit K 10mg  IV + 2 units FFP on 8/18. 9/7 vit K PO again for INR < 2 to do angiogram.  - Hep level 0.68 (goal 0.3-0.5) . INR 2.2, Hgb 9 stable today. Plts WNL. - PTA Warfarin: 9 mg daily except 6 mg on Monday  Goal of Therapy:  Heparin level 0.3-0.5 units/ml Monitor platelets by anticoagulation protocol: Yes   Plan:  Decrease heparin gtt to 1100 units/hr  Warfarin 9 mg PO x 1 today  Daily heparin level, INR, CBC, s/s bleeding   Javier Gell S. Alford Highland, PharmD, BCPS Clinical Staff Pharmacist [email or web address] Alford Highland, Desirre Eickhoff Stillinger 02/09/2020,7:27 AM

## 2020-02-09 NOTE — Progress Notes (Addendum)
Nephrology Follow-Up Consult note   Summary: [name] is a/an 59 y.o. female with a past medical history significant for ESRD, aortic and mitral valve replacement on Coumadin, admitted for retroperitoneal bleed.      Exam: Constitutional: well-appearing, no acute distress ENMT: ears and nose without scars or lesions, MMM CV: normal rate, no edema Respiratory: bilateral chest rise, normal work of breathing Gastrointestinal: soft, NABS Skin: no visible lesions or rashes Nonfocal, Ox 3   OP HD:MWF  4h 61kg 450/800 2/2 bath P2 Hep 1800 -sensipar 180mg  -calcitriol 55mcg -no esa, no iron  Problems: 1.Lretroperitonealperirenal bleed/hematoma - Bleed seen on CT 8/18.  Status post multiple blood products requiring IV heparin for bridge given history of valve replacement.  Now status post renal embolization with VIR.  Hemoglobin has stabilized.  - coumadin restarted 9/10, continues on IV heparin, INR rising appropriately, nearing goal of 2.5. INR 2.2 today.  3.Hx AVR/MVR-anticoagulation as above 4. ESRD- on HD MWF. HD tomorrow. 5. Anemiaof CKD- As above S/p 6 units pRBC. TSAT 84% 8/30.  Aranesp 52mcg qweekly started on 9/13. Consider increasing if needed 6. Secondary hyperparathyroidism- PTH 109 (has been here 30 days). Cont calcitriol. sensipar decreased 180->120mg  three times weekly. Continue decreased dose of Auryxia 210 mg 3 times daily.  7.HTN/volume-[name] 60.5kg.  Overall stable.  Blood pressure acceptable 8. Nutrition- Renal diet w/fluid restrictions. Alb 2.8. Protein supplements.     [facility] 02/09/2020 11:34 AM  ___________________________________________________________  CC: ESRD  Interval History/Subjective: Patient feels well with no complaints today. Seen walking in the halls.    Medications:  Current Facility-Administered Medications  Medication Dose Route Frequency Provider Last Rate Last  Admin  . 0.9 %  sodium chloride infusion   Intravenous PRN Charlynne Cousins, MD      . acetaminophen (TYLENOL) tablet 650 mg  650 mg Oral Q6H PRN Charlynne Cousins, MD   650 mg at 02/08/20 0543  . calcitRIOL (ROCALTROL) capsule 2 mcg  2 mcg Oral Q M,W,F Gean Quint, MD   2 mcg at 02/08/20 1017  . Chlorhexidine Gluconate Cloth 2 % PADS 6 each  6 each Topical Q0600 Samuella Cota, MD   6 each at 02/08/20 [phone]  . cinacalcet (SENSIPAR) tablet 120 mg  120 mg Oral Q M,W,F-HD Reesa Chew, MD   120 mg at 02/08/20 1343  . Darbepoetin Alfa (ARANESP) injection 60 mcg  60 mcg Intravenous Q Mon-HD Reesa Chew, MD   60 mcg at 02/08/20 1020  . docusate sodium (COLACE) capsule 100 mg  100 mg Oral Daily Bowser, Laurel Dimmer, NP   100 mg at 02/09/20 0853  . ferric citrate (AURYXIA) tablet 210 mg  210 mg Oral TID WC Reesa Chew, MD   210 mg at 02/09/20 0852  . heparin ADULT infusion 100 units/mL (25000 units/265mL sodium chloride 0.45%)  1,100 Units/hr Intravenous Continuous Karren Cobble, RPH 11 mL/hr at 02/09/20 0853 1,100 Units/hr at 02/09/20 0853  . hydrALAZINE (APRESOLINE) injection 10 mg  10 mg Intravenous Q6H PRN Debbe Odea, MD   10 mg at 01/24/20 1153  . hydrocortisone cream 1 %   Topical QID PRN Shalhoub, Sherryll Burger, MD   Given at 02/01/20 0230  . iohexol (OMNIPAQUE) 300 MG/ML solution 150 mL  150 mL Intra-arterial Once PRN Jacqulynn Cadet, MD      . metoprolol tartrate (LOPRESSOR) tablet 25 mg  25 mg Oral BID Fay Records, MD   25  mg at 02/09/20 0853  . multivitamin (RENA-VIT) tablet 1 tablet  1 tablet Oral QHS Roney Jaffe, MD   1 tablet at 02/08/20 2139  . ondansetron (ZOFRAN) injection 4 mg  4 mg Intravenous Q6H PRN Debbe Odea, MD   4 mg at 02/05/20 2041  . ondansetron (ZOFRAN-ODT) disintegrating tablet 4 mg  4 mg Oral Q8H PRN Icard, Bradley L, DO   4 mg at 01/18/20 1818  . oxyCODONE (Oxy IR/ROXICODONE) immediate release tablet 10 mg  10 mg Oral Q4H PRN Debbe Odea, MD   10 mg at 02/08/20 2145  . pantoprazole (PROTONIX) EC tablet 40 mg  40 mg Oral QAC breakfast Donnamae Jude, RPH   40 mg at 02/09/20 0853  . polyethylene glycol (MIRALAX / GLYCOLAX) packet 17 g  17 g Oral BID Charlynne Cousins, MD   17 g at 02/07/20 2107  . senna (SENOKOT) tablet 8.6 mg  1 tablet Oral Daily Bowser, Grace E, NP   8.6 mg at 02/09/20 0852  . simethicone (MYLICON) chewable tablet 80 mg  80 mg Oral TID Cristal Generous, NP   80 mg at 02/09/20 0852  . warfarin (COUMADIN) tablet 9 mg  9 mg Oral ONCE-1600 Alford Highland [place], [place]      . Warfarin - Pharmacist Dosing Inpatient   Does not apply q1600 Karren Cobble, [facility]   Given at 02/08/20 1657      Review of Systems: 10 systems reviewed and negative except per interval history/subjective  Physical Exam: Vitals:   02/08/20 2020 02/09/20 0340  BP: (!) 146/85 121/79  Pulse: 94 89  Resp: 16 15  Temp: 98.3 F (36.8 C) 98.6 F (37 C)  SpO2: 100% 99%   Total I/O In: 200 [P.O.:200] Out: -   Intake/Output Summary (Last 24 hours) at 02/09/2020 1134 Last data filed at 02/09/2020 1443 Gross per 24 hour  Intake 815 ml  Output 3000 ml  Net -2185 ml    Test Results I personally reviewed new and old clinical labs and radiology tests Lab Results  Component Value Date   NA 132 (L) 02/09/2020   K 4.2 02/09/2020   CL 95 (L) 02/09/2020   CO2 27 02/09/2020   BUN 20 02/09/2020   CREATININE 4.49 (H) 02/09/2020   CALCIUM 9.9 02/09/2020   ALBUMIN 2.7 (L) 02/09/2020   PHOS 3.8 02/09/2020

## 2020-02-09 NOTE — Progress Notes (Signed)
°  Mobility Specialist Criteria Algorithm Info.  Mobility Team: [facility] elevated:Self regulated Activity: Ambulated in hall Range of motion: Active;All extremities Level of assistance: Independent Assistive device: None Minutes sitting in chair:  Minutes stood: 10 minutes Minutes ambulated: 10 minutes Distance ambulated (ft): 1500 ft Mobility response: Tolerated well; required standing rest break x1 Bed Position: Semi-fowlers  Pt tolerated ambulation well w/o any complaints.   02/09/2020 10:14 AM

## 2020-02-09 NOTE — Progress Notes (Signed)
PROGRESS NOTE  [name] VWU:[id number] DOB: [birth year] DOA: 01/06/2020 PCP: Benito Mccreedy, MD  Brief History   59 year old woman PMH including ESRD, status post aortic and mitral valve replacement on warfarin, presented with flank pain; CT revealed large spontaneous left retroperitoneal hematoma, INR was 3.6. seen by surgery, cardiology, PCCM, nephrology and urology. Required total 6 units PRBCs, 2 units FFP and vitamin K. About 10 days in pt developed gross hematuria, imaging nondiagnostic for renal mass. Attending discussed w/ urology; given negative imaging rec was to followup as outpat w/ urology to further investigate. Developed flank pain about 2 weeks in, CT showed enlarging hematoma associate w/ left kidney. Underwent left renal angiogram and coil embolization. S/p post IR guided angiogram and embolization bleeding artery 9/8 patient stablized. Hemoglobin is stabilized. Warfarin resumed. Remains on heparin infusion until INR therapeutic then anticipate discharge home.   A & P  Retroperitoneal hemorrhage --spontaneous --Treated with 6 units PRBC, vitamin K and 2 units FFP --s/p embolization. --Hemoglobin remains stable. --Monitor clinically.  End-stage renal disease on hemodialysis [facility]) --Continue management per nephrology.  S/P mitral valve replacement with metallic valve --Continue heparin infusion until INR therapeutic. Goal 2.5-3.5. Continue warfarin per pharmacy. --f/u with warfarin clinic  Status post mechanical aortic valve replacement --Continue heparin infusion until INR therapeutic. Continue warfarin per pharmacy.  Urethral bleeding --minimal w/ no change in CBC --likely old blood; minimal w/ stable Hgb, expect will spontaneously resolve --can f/u w/ urology as outpt if needed  Hemorrhagic shock [facility]) -admitted by PCCM --secondary to spontaneous retroperitoneal bleed, ABLA in setting of supra-therapeutic INR   Aortic atherosclerosis (HCC) --no  inpt treatment --consider statin as outpt  Acquired thrombophilia ([place]) --secondary to mechanical AV and MV --heparin until warfarin therapeutic  Disposition Plan:  Discussion: continue heparin until INR 2.5.  Status is: Inpatient  Remains inpatient appropriate because:IV treatments appropriate due to intensity of illness or inability to take PO   Dispo: The patient is from: Home              Anticipated d/c is to: Home              Anticipated d/c date is: 2 days              Patient currently is not medically stable to d/c.  DVT prophylaxis: heparin infusion SCDs Start: 01/06/20 1944 Code Status: Full Family Communication: none  [name], MD  [facility] Direct contact: see [email or web address] (further directions at bottom of note if needed) 7PM-7AM contact night coverage as at bottom of note 02/09/2020, 5:46 PM  LOS: 34 days    Consults:  . PCCM admitted . General surgery . Nephrology . Cardiology . Urology . IR   Interval History/Subjective  CC f/u INR Feels ok  Objective   Vitals:  Vitals:   02/09/20 0340 02/09/20 1134  BP: 121/79 122/68  Pulse: 89 85  Resp: 15 18  Temp: 98.6 F (37 C) 98.4 F (36.9 C)  SpO2: 99% 100%    Exam: Physical Exam Vitals reviewed.  Constitutional:      General: She is not in acute distress.    Appearance: Normal appearance.  Neurological:     Mental Status: She is alert.  Psychiatric:        Mood and Affect: Mood normal.        Behavior: Behavior normal.    I have personally reviewed the following:   Today's Data  . INR 2.2 . BMP noted . Hgb  stable at 9.0  Scheduled Meds: . calcitRIOL  2 mcg Oral Q M,W,F  . Chlorhexidine Gluconate Cloth  6 each Topical Q0600  . cinacalcet  120 mg Oral Q M,W,F-HD  . darbepoetin (ARANESP) injection - DIALYSIS  60 mcg Intravenous Q Mon-HD  . docusate sodium  100 mg Oral Daily  . ferric citrate  210 mg Oral TID WC  . metoprolol tartrate  25 mg Oral BID  . multivitamin   1 tablet Oral QHS  . pantoprazole  40 mg Oral QAC breakfast  . polyethylene glycol  17 g Oral BID  . senna  1 tablet Oral Daily  . simethicone  80 mg Oral TID  . Warfarin - Pharmacist Dosing Inpatient   Does not apply q1600   Continuous Infusions: . sodium chloride    . heparin 1,100 Units/hr (02/09/20 1641)    Principal Problem:   Retroperitoneal hemorrhage Active Problems:   End-stage renal disease on hemodialysis (HCC)   S/P mitral valve replacement with metallic valve   Status post mechanical aortic valve replacement   Nontraumatic retroperitoneal hematoma   Acquired thrombophilia ([place])   Urethral bleeding   Hemorrhagic shock ([place])   Aortic atherosclerosis ([place])   LOS: 34 days   How to contact the [facility] Attending or Consulting provider [place] or covering provider during after hours [place], for this patient?  1. Check the care team in [facility] and look for a) attending/consulting TRH provider listed and b) the [facility] team listed 2. Log into [email or web address] and use [place]'s universal password to access. If you do not have the password, please contact the hospital operator. 3. Locate the [facility] provider you are looking for under [facility] and page to a number that you can be directly reached. 4. If you still have difficulty reaching the provider, please page the [facility] (Director on Call) for the Hospitalists listed on amion for assistance.

## 2020-02-09 NOTE — Progress Notes (Signed)
Physical Therapy Treatment Patient Details Name: [name] MRN: [id number] DOB: [birth year] Today's Date: 02/09/2020    History of Present Illness Patient is a 59 year old female with history of ESRD on HD MWF, s/p aortic and mitral valve replacement on warfarin therapy who presented with nausea, nonbloody emesis and abdominal pain; found to have spontaneous left retroperitoneal bleed.  On 9/8 underwent Left renal angiogram and coil embolization.     PT Comments    Pt fully participated in session; pt fatigued during standing exercises requiring standing and seated rest breaks; pt demonstrating improved standing balance initially but when fatigued required 1 UE support; pt will continue to benefit from skilled PT to address deficits in balance, strength, gait and endurance to maximize independence with functional mobility prior to discharge.    Follow Up Recommendations  Home health PT;Supervision - Intermittent     Equipment Recommendations  None recommended by PT    Recommendations for Other Services       Precautions / Restrictions Precautions Precautions: Fall Precaution Comments: easily nauseated Restrictions Weight Bearing Restrictions: No    Mobility  Bed Mobility Overal bed mobility: Modified Independent                Transfers Overall transfer level: Needs assistance   Transfers: Sit to/from Stand;Stand Pivot Transfers Sit to Stand: Supervision Stand pivot transfers: Supervision       General transfer comment: supervision for lines  Ambulation/Gait                 Stairs             Wheelchair Mobility    Modified Rankin (Stroke Patients Only)       Balance                                            Cognition Arousal/Alertness: Awake/alert Behavior During Therapy: WFL for tasks assessed/performed Overall Cognitive Status: Within Functional Limits for tasks assessed                                         Exercises General Exercises - Lower Extremity Hip ABduction/ADduction: AROM;Both;20 reps;Standing (1 UE support) Hip Flexion/Marching: AROM;Both;20 reps;Standing Mini-Sqauts: AROM;Both;20 reps;Standing Other Exercises Other Exercises: hip extension 2sets of 10 B LE 1 UE support    General Comments        Pertinent Vitals/Pain Pain Assessment: No/denies pain    Home Living                      Prior Function            PT Goals (current goals can now be found in the care plan section) Acute Rehab PT Goals Patient Stated Goal: manage her symptoms PT Goal Formulation: With patient Time For Goal Achievement: 02/11/20 Potential to Achieve Goals: Good Additional Goals Additional Goal #1: Pt to score >19 on DGI to indicate minimal falls risk. Progress towards PT goals: Progressing toward goals    Frequency    Min 3X/week      PT Plan Current plan remains appropriate    Co-evaluation              AM-PAC PT "6 Clicks" Mobility   Outcome Measure    Help  needed moving from lying on your back to sitting on the side of a flat bed without using bedrails?: None Help needed moving to and from a bed to a chair (including a wheelchair)?: None Help needed standing up from a chair using your arms (e.g., wheelchair or bedside chair)?: None Help needed to walk in hospital room?: A Little Help needed climbing 3-5 steps with a railing? : A Little 6 Click Score: 18    End of Session Equipment Utilized During Treatment: Gait belt Activity Tolerance: Patient tolerated treatment well Patient left: with call bell/phone within reach;in bed Nurse Communication: Mobility status PT Visit Diagnosis: Muscle weakness (generalized) (M62.81)     Time: 4403-4742 PT Time Calculation (min) (ACUTE ONLY): 12 min  Charges:  $Therapeutic Exercise: 8-22 mins                     Lyanne Co, DPT Acute Rehabilitation Services [phone]   [place] 02/09/2020, 1:47 PM

## 2020-02-10 DIAGNOSIS — I700 Atherosclerosis of aorta: Secondary | ICD-10-CM

## 2020-02-10 LAB — PROTIME-INR
INR: 2 — ABNORMAL HIGH (ref 0.8–1.2)
Prothrombin Time: 21.6 seconds — ABNORMAL HIGH (ref 11.4–15.2)

## 2020-02-10 LAB — CBC
HCT: 28.3 % — ABNORMAL LOW (ref 36.0–46.0)
Hemoglobin: 9.1 g/dL — ABNORMAL LOW (ref 12.0–15.0)
MCH: 33.8 pg (ref 26.0–34.0)
MCHC: 32.2 g/dL (ref 30.0–36.0)
MCV: 105.2 fL — ABNORMAL HIGH (ref 80.0–100.0)
Platelets: 270 10*3/uL (ref 150–400)
RBC: 2.69 MIL/uL — ABNORMAL LOW (ref 3.87–5.11)
RDW: 18.8 % — ABNORMAL HIGH (ref 11.5–15.5)
WBC: 9.1 10*3/uL (ref 4.0–10.5)
nRBC: 0.7 % — ABNORMAL HIGH (ref 0.0–0.2)

## 2020-02-10 LAB — HEPARIN LEVEL (UNFRACTIONATED): Heparin Unfractionated: 0.21 IU/mL — ABNORMAL LOW (ref 0.30–0.70)

## 2020-02-10 MED ORDER — WARFARIN SODIUM 10 MG PO TABS
10.0000 mg | ORAL_TABLET | Freq: Once | ORAL | Status: AC
  Administered 2020-02-10: 10 mg via ORAL
  Filled 2020-02-10: qty 1

## 2020-02-10 MED ORDER — OXYCODONE HCL 5 MG PO TABS
ORAL_TABLET | ORAL | Status: AC
  Filled 2020-02-10: qty 2

## 2020-02-10 MED ORDER — HEPARIN SODIUM (PORCINE) 1000 UNIT/ML DIALYSIS: 1800.0000 [IU] | Freq: Once | INTRAMUSCULAR | Status: DC

## 2020-02-10 MED ORDER — OXYCODONE-ACETAMINOPHEN 5-325 MG PO TABS
ORAL_TABLET | ORAL | Status: DC
Start: 2020-02-10 — End: 2020-02-10
  Filled 2020-02-10: qty 2

## 2020-02-10 NOTE — Progress Notes (Signed)
PROGRESS NOTE  [name] TMH:[id number] DOB: 05-Jan-1961 DOA: 01/06/2020 PCP: Benito Mccreedy, MD   LOS: 35 days   Brief narrative: As per HPI,  59 year old woman PMH including ESRD, status post aortic and mitral valve replacement on warfarin, presented to the hospital with flank pain.  CT revealed large spontaneous left retroperitoneal hematoma, INR was 3.6. Patient was seen by surgery, cardiology, PCCM, nephrology and urology. Patient required total 6 units PRBCs, 2 units FFP and vitamin K. About 10 days in, pt developed gross hematuria, imaging nondiagnostic for renal mass.  Patient was then seen by urology; given negative imaging recomendation was to followup as outpat w/ urology to further investigate. Developed flank pain about 2 weeks in, CT showed enlarging hematoma associate w/ left kidney. Underwent left renal angiogram and coil embolization. S/p post IR guided angiogram and embolization bleeding artery 9/8 and the patient was stablized. Hemoglobin was stabilized and warfarin resumed. Remains on heparin infusion until INR therapeutic then anticipate discharge home.  Assessment/Plan:  Principal Problem:   Retroperitoneal hemorrhage Active Problems:   End-stage renal disease on hemodialysis (HCC)   S/P mitral valve replacement with metallic valve   Nontraumatic retroperitoneal hematoma   Status post mechanical aortic valve replacement   Urethral bleeding   Hemorrhagic shock (HCC)   Aortic atherosclerosis (HCC)   Acquired thrombophilia (HCC)  Retroperitoneal hemorrhage Spontaneous.  Was treated with 6 units PRBC, vitamin K and 2 units FFP.  Underwent coil embolization of the renal artery thought to be secondary to renal bleeding causing retroperitoneal hematoma.  Currently hemoglobin stable.  Currently on Heparin drip and Coumadin.  INR is still subtherapeutic.  End-stage renal disease on hemodialysis. Continue hemodialysis as per nephrology.  S/P mitral and aortic  mechanical valve replacement --Continue heparin infusion until INR therapeutic. Goal 2.5-3.5. Continue warfarin per pharmacy.  Still subtherapeutic.  Hematuria minimal likely old. None currently.  Hemorrhagic shock  Secondary to retroperitoneal hematoma.  Resolved  Aortic atherosclerosis Consider statin on discharge.  Acquired thrombophilia  --secondary to mechanical AV and MV.  Continue heparin and warfarin until therapeutic.  DVT prophylaxis: SCDs Start: 01/06/20 1944 warfarin (COUMADIN) tablet 10 mg    Code Status: Full code  Family Communication: None  Status is: Inpatient  Remains inpatient appropriate because:Subtherapeutic INR   Dispo: The patient is from: Home              Anticipated d/c is to: Home              Anticipated d/c date is: 1 day              Patient currently is not medically stable to d/c.   Consultants:  PCCM admitted  General surgery  Nephrology  Cardiology  Urology  IR  Procedures:  IR guided angiogram and embolization on 01/27/2020.  Antibiotics:  . None  Anti-infectives (From admission, onward)   None      Subjective: Today, patient was seen and examined at bedside.  Denies loin pain, fever, chills, shortness of breath.  Objective: Vitals:   02/10/20 1233 02/10/20 1253  BP: (!) 153/80 (!) 153/80  Pulse: 92 95  Resp:  16  Temp:  97.8 F (36.6 C)  SpO2:  95%    Intake/Output Summary (Last 24 hours) at 02/10/2020 1403 Last data filed at 02/10/2020 1253 Gross per 24 hour  Intake 560 ml  Output 2800 ml  Net -2240 ml   Filed Weights   02/09/20 0340 02/10/20 0549 02/10/20 1253  Weight:  62.1 kg 63.3 kg 60.5 kg   Body mass index is 21.53 kg/m.   Physical Exam: GENERAL: Patient is alert awake and oriented. Not in obvious distress. HENT: Mild pallor noted.  Pupils equally reactive to light. Oral mucosa is moist NECK: is supple, no gross swelling noted. CHEST: Clear to auscultation. No crackles or wheezes.   Diminished breath sounds bilaterally. CVS: S1 and S2 heard, no murmur. Regular rate and rhythm.  ABDOMEN: Soft, non-tender, bowel sounds are present. EXTREMITIES: No edema.  Right upper extremity fistula. CNS: Cranial nerves are intact. No focal motor deficits. SKIN: warm and dry without rashes.  Data Review: I have personally reviewed the following laboratory data and studies,  CBC: Recent Labs  Lab 02/05/20 0259 02/06/20 0604 02/08/20 0410 02/09/20 0358 02/10/20 0500  WBC 9.9 10.0 11.4* 10.0 9.1  HGB 8.4* 8.8* 8.6* 9.0* 9.1*  HCT 26.9* 29.1* 27.0* 28.8* 28.3*  MCV 103.1* 104.7* 101.5* 103.6* 105.2*  PLT 320 305 296 280 629   Basic Metabolic Panel: Recent Labs  Lab 02/05/20 0259 02/05/20 1359 02/06/20 0604 02/07/20 1252 02/08/20 0410 02/09/20 0358  NA 129*  --  133* 130* 128* 132*  K 4.6  --  4.0 4.3 4.4 4.2  CL 95*  --  96* 92* 92* 95*  CO2 24  --  25 21* 24 27  GLUCOSE 87  --  122* 96 91 101*  BUN 34*  --  20 38* 47* 20  CREATININE 6.01*  --  4.49* 7.18* 8.25* 4.49*  CALCIUM 9.4 9.5 9.2 9.6 9.6 9.9  PHOS 3.4  --  3.2 4.0 4.3 3.8   Liver Function Tests: Recent Labs  Lab 02/05/20 0259 02/06/20 0604 02/07/20 1252 02/08/20 0410 02/09/20 0358  ALBUMIN 2.4* 2.8* 2.8* 2.5* 2.7*   No results for input(s): LIPASE, AMYLASE in the last 168 hours. No results for input(s): AMMONIA in the last 168 hours. Cardiac Enzymes: No results for input(s): CKTOTAL, CKMB, CKMBINDEX, TROPONINI in the last 168 hours. BNP (last 3 results) No results for input(s): BNP in the last 8760 hours.  ProBNP (last 3 results) No results for input(s): PROBNP in the last 8760 hours.  CBG: No results for input(s): GLUCAP in the last 168 hours. No results found for this or any previous visit (from the past 240 hour(s)).   Studies: No results found.    Flora Lipps, MD  [facility] 02/10/2020

## 2020-02-10 NOTE — Progress Notes (Addendum)
Nephrology Follow-Up Consult note   Summary: [name] is a/an 59 y.o. female with a past medical history significant for ESRD, aortic and mitral valve replacement on Coumadin, admitted for retroperitoneal bleed.      Exam: Constitutional: well-appearing, no acute distress ENMT: ears and nose without scars or lesions, MMM CV: normal rate, no edema Respiratory: bilateral chest rise, normal work of breathing Gastrointestinal: soft, NABS Skin: no visible lesions or rashes Nonfocal, Ox 3 RUA aVG+ bruit   OP HD:MWF  4h 61kg 450/800 2/2 bath P2 Hep 1800  R AVG -sensipar 180mg  -calcitriol 79mcg -no esa, no iron  Problems: 1.Lretroperitonealperirenal bleed/hematoma - Bleed seen on CT 8/18.  Status post multiple blood products requiring IV heparin for bridge given history of valve replacement.  Now status post renal embolization with VIR.  Hemoglobin has stabilized.  - coumadin restarted 9/10, continues on IV heparin, INR rising appropriately, nearing goal of 2.5. INR pending today, was 2.2 yest.   3.Hx AVR/MVR-anticoagulation as above 4. ESRD- on HD MWF. HD tomorrow. 5. Anemiaof CKD- As above S/p 6 units pRBC. TSAT 84% 8/30.  Aranesp 37mcg qweekly started on 9/13. Consider increasing if needed 6. Secondary hyperparathyroidism- PTH 109 (has been here 30 days). Cont calcitriol. sensipar decreased 180->120mg  three times weekly. Continue decreased dose of Auryxia 210 mg 3 times daily.  7.HTN/volume-[name] 60.5kg.  Overall stable.  Blood pressure acceptable 8. Nutrition- Renal diet w/fluid restrictions. Alb 2.8. Protein supplements.     [facility] 02/10/2020 10:43 AM  __________________________________________________________ Interval History/Subjective: Patient feels well with no complaints today. Seen on dialysis.    Medications:  Current Facility-Administered Medications  Medication Dose Route Frequency Provider  Last Rate Last Admin  . 0.9 %  sodium chloride infusion   Intravenous PRN Charlynne Cousins, MD      . acetaminophen (TYLENOL) tablet 650 mg  650 mg Oral Q6H PRN Charlynne Cousins, MD   650 mg at 02/08/20 0543  . calcitRIOL (ROCALTROL) capsule 2 mcg  2 mcg Oral Q M,W,F Gean Quint, MD   2 mcg at 02/08/20 1017  . Chlorhexidine Gluconate Cloth 2 % PADS 6 each  6 each Topical Q0600 Samuella Cota, MD   6 each at 02/10/20 [phone]  . cinacalcet (SENSIPAR) tablet 120 mg  120 mg Oral Q M,W,F-HD Reesa Chew, MD   120 mg at 02/08/20 1343  . Darbepoetin Alfa (ARANESP) injection 60 mcg  60 mcg Intravenous Q Mon-HD Reesa Chew, MD   60 mcg at 02/08/20 1020  . docusate sodium (COLACE) capsule 100 mg  100 mg Oral Daily Bowser, Laurel Dimmer, NP   100 mg at 02/09/20 0853  . ferric citrate (AURYXIA) tablet 210 mg  210 mg Oral TID WC Reesa Chew, MD   210 mg at 02/09/20 1640  . heparin ADULT infusion 100 units/mL (25000 units/23mL sodium chloride 0.45%)  1,100 Units/hr Intravenous Continuous Karren Cobble, RPH 11 mL/hr at 02/09/20 1641 1,100 Units/hr at 02/09/20 1641  . [START ON 02/11/2020] heparin injection 1,800 Units  1,800 Units Dialysis Once in dialysis Roney Jaffe, MD      . hydrALAZINE (APRESOLINE) injection 10 mg  10 mg Intravenous Q6H PRN Debbe Odea, MD   10 mg at 01/24/20 1153  . hydrocortisone cream 1 %   Topical QID PRN Shalhoub, Sherryll Burger, MD   Given at 02/01/20 0230  . iohexol (OMNIPAQUE) 300 MG/ML solution 150 mL  150 mL Intra-arterial Once PRN  Jacqulynn Cadet, MD      . metoprolol tartrate (LOPRESSOR) tablet 25 mg  25 mg Oral BID Fay Records, MD   25 mg at 02/09/20 2101  . multivitamin (RENA-VIT) tablet 1 tablet  1 tablet Oral QHS Roney Jaffe, MD   1 tablet at 02/09/20 2101  . ondansetron (ZOFRAN) injection 4 mg  4 mg Intravenous Q6H PRN Debbe Odea, MD   4 mg at 02/05/20 2041  . ondansetron (ZOFRAN-ODT) disintegrating tablet 4 mg  4 mg Oral Q8H PRN Icard,  Bradley L, DO   4 mg at 01/18/20 1818  . oxyCODONE (Oxy IR/ROXICODONE) 5 MG immediate release tablet           . oxyCODONE (Oxy IR/ROXICODONE) immediate release tablet 10 mg  10 mg Oral Q4H PRN Debbe Odea, MD   10 mg at 02/10/20 0939  . pantoprazole (PROTONIX) EC tablet 40 mg  40 mg Oral QAC breakfast Donnamae Jude, RPH   40 mg at 02/09/20 0853  . polyethylene glycol (MIRALAX / GLYCOLAX) packet 17 g  17 g Oral BID Charlynne Cousins, MD   17 g at 02/07/20 2107  . senna (SENOKOT) tablet 8.6 mg  1 tablet Oral Daily Bowser, Grace E, NP   8.6 mg at 02/09/20 0852  . simethicone (MYLICON) chewable tablet 80 mg  80 mg Oral TID Cristal Generous, NP   80 mg at 02/09/20 2101  . Warfarin - Pharmacist Dosing Inpatient   Does not apply q1600 Karren Cobble, [facility]   Given at 02/08/20 1657      Review of Systems: 10 systems reviewed and negative except per interval history/subjective  Physical Exam: Vitals:   02/10/20 1004 02/10/20 1024  BP:    Pulse: 89 93  Resp:    Temp:    SpO2:     No intake/output data recorded.  Intake/Output Summary (Last 24 hours) at 02/10/2020 1043 Last data filed at 02/10/2020 0555 Gross per 24 hour  Intake 800 ml  Output --  Net 800 ml    Test Results I personally reviewed new and old clinical labs and radiology tests Lab Results  Component Value Date   NA 132 (L) 02/09/2020   K 4.2 02/09/2020   CL 95 (L) 02/09/2020   CO2 27 02/09/2020   BUN 20 02/09/2020   CREATININE 4.49 (H) 02/09/2020   CALCIUM 9.9 02/09/2020   ALBUMIN 2.7 (L) 02/09/2020   PHOS 3.8 02/09/2020

## 2020-02-10 NOTE — Progress Notes (Addendum)
ANTICOAGULATION CONSULT NOTE - Follow Up Consult  Pharmacy Consult for Heparin, Coumadin Indication: Mechanical heart valves  No Known Allergies  Patient Measurements: Height: 5\' 6"  (167.6 cm) Weight: 60.5 kg (133 lb 6.1 oz) IBW/kg (Calculated) : 59.3 Heparin Dosing Weight:  62.1 kg  Vital Signs: Temp: 97.8 F (36.6 C) (09/22 1253) Temp Source: Oral (09/22 1253) BP: 153/80 (09/22 1253) Pulse Rate: 95 (09/22 1253)  Labs: Recent Labs    02/07/20 1820 02/08/20 0410 02/08/20 0410 02/08/20 0412 02/09/20 0358 02/10/20 0500  HGB  --  8.6*   < >  --  9.0* 9.1*  HCT  --  27.0*  --   --  28.8* 28.3*  PLT  --  296  --   --  280 270  LABPROT  --  23.1*  --   --  24.0* 21.6*  INR  --  2.1*  --   --  2.2* 2.0*  HEPARINUNFRC   < >  --   --  0.33 0.68 0.21*  CREATININE  --  8.25*  --   --  4.49*  --    < > = values in this interval not displayed.    Estimated Creatinine Clearance: 12.9 mL/min (A) (by C-G formula based on SCr of 4.49 mg/dL (H)).   Assessment:  Anticoag: s/p RP hematoma per CT 8/18. Warfarin PTA for hx mech AVR/MVR (goal 2.5-3.5) reversed with vit K 10mg  IV + 2 units FFP on 8/18. 9/7 vit K PO again for INR < 2 to do angiogram.  - Hep level 0.21 (goal 0.3-0.5) . INR 2, Hgb 9.1 stable today. Plts WNL. - PTA Warfarin: 9 mg daily except 6 mg on Monday   Goal of Therapy:  Heparin level 0.3-0.5 units/ml Monitor platelets by anticoagulation protocol: Yes   Plan:  Increase heparin slightly to 1150 units/hr Warfarin 10 mg PO x 1 today  Daily heparin level, INR, CBC, s/s bleeding   Jori Thrall S. Alford Highland, PharmD, BCPS Clinical Staff Pharmacist [email or web address] Alford Highland, [employer] 02/10/2020,1:49 PM

## 2020-02-10 NOTE — Plan of Care (Signed)
  Problem: Health Behavior/Discharge Planning: Goal: Ability to manage health-related needs will improve Outcome: Progressing   Problem: Clinical Measurements: Goal: Ability to maintain clinical measurements within normal limits will improve Outcome: Progressing Goal: Will remain free from infection Outcome: Progressing Goal: Diagnostic test results will improve Outcome: Progressing Goal: Respiratory complications will improve Outcome: Progressing Goal: Cardiovascular complication will be avoided Outcome: Progressing   Problem: Activity: Goal: Risk for activity intolerance will decrease Outcome: Progressing   Problem: Elimination: Goal: Will not experience complications related to urinary retention Outcome: Progressing   Problem: Education: Goal: Ability to demonstrate management of disease process will improve Outcome: Progressing Goal: Ability to verbalize understanding of medication therapies will improve Outcome: Progressing Goal: Individualized Educational Video(s) Outcome: Progressing   Problem: Activity: Goal: Capacity to carry out activities will improve Outcome: Progressing   Problem: Cardiac: Goal: Ability to achieve and maintain adequate cardiopulmonary perfusion will improve Outcome: Progressing

## 2020-02-11 LAB — RENAL FUNCTION PANEL
Albumin: 2.8 g/dL — ABNORMAL LOW (ref 3.5–5.0)
Anion gap: 11 (ref 5–15)
BUN: 19 mg/dL (ref 6–20)
CO2: 26 mmol/L (ref 22–32)
Calcium: 9.6 mg/dL (ref 8.9–10.3)
Chloride: 96 mmol/L — ABNORMAL LOW (ref 98–111)
Creatinine, Ser: 5.1 mg/dL — ABNORMAL HIGH (ref 0.44–1.00)
GFR calc Af Amer: 10 mL/min — ABNORMAL LOW (ref >60)
GFR calc non Af Amer: 9 mL/min — ABNORMAL LOW (ref >60)
Glucose, Bld: 91 mg/dL (ref 70–99)
Phosphorus: 3.9 mg/dL (ref 2.5–4.6)
Potassium: 4.1 mmol/L (ref 3.5–5.1)
Sodium: 133 mmol/L — ABNORMAL LOW (ref 135–145)

## 2020-02-11 LAB — HEPARIN LEVEL (UNFRACTIONATED)
Heparin Unfractionated: 0.22 IU/mL — ABNORMAL LOW (ref 0.30–0.70)
Heparin Unfractionated: 0.75 IU/mL — ABNORMAL HIGH (ref 0.30–0.70)

## 2020-02-11 LAB — CBC
HCT: 30.7 % — ABNORMAL LOW (ref 36.0–46.0)
Hemoglobin: 9.5 g/dL — ABNORMAL LOW (ref 12.0–15.0)
MCH: 33.5 pg (ref 26.0–34.0)
MCHC: 30.9 g/dL (ref 30.0–36.0)
MCV: 108.1 fL — ABNORMAL HIGH (ref 80.0–100.0)
Platelets: 257 10*3/uL (ref 150–400)
RBC: 2.84 MIL/uL — ABNORMAL LOW (ref 3.87–5.11)
RDW: 19.5 % — ABNORMAL HIGH (ref 11.5–15.5)
WBC: 8.2 10*3/uL (ref 4.0–10.5)
nRBC: 1 % — ABNORMAL HIGH (ref 0.0–0.2)

## 2020-02-11 LAB — PROTIME-INR
INR: 2.2 — ABNORMAL HIGH (ref 0.8–1.2)
Prothrombin Time: 23.6 seconds — ABNORMAL HIGH (ref 11.4–15.2)

## 2020-02-11 MED ORDER — WARFARIN SODIUM 2 MG PO TABS
12.0000 mg | ORAL_TABLET | Freq: Once | ORAL | Status: AC
  Administered 2020-02-11: 12 mg via ORAL
  Filled 2020-02-11: qty 1

## 2020-02-11 NOTE — Progress Notes (Signed)
[place] for heparin, warfarin Indication: mechanical AVR/MVR  No Known Allergies  Patient Measurements: Height: 5\' 6"  (167.6 cm) Weight: 61.5 kg (135 lb 9.3 oz) (scale a) IBW/kg (Calculated) : 59.3 Heparin Dosing Weight: 62kg  Vital Signs: Temp: 98.6 F (37 C) (09/23 2019) Temp Source: Oral (09/23 2019) BP: 136/73 (09/23 2019) Pulse Rate: 88 (09/23 2019)  Labs: Recent Labs    02/09/20 0358 02/09/20 0358 02/10/20 0500 02/11/20 0927 02/11/20 2038  HGB 9.0*   < > 9.1* 9.5*  --   HCT 28.8*  --  28.3* 30.7*  --   PLT 280  --  270 257  --   LABPROT 24.0*  --  21.6* 23.6*  --   INR 2.2*  --  2.0* 2.2*  --   HEPARINUNFRC 0.68   < > 0.21* 0.22* 0.75*  CREATININE 4.49*  --   --  5.10*  --    < > = values in this interval not displayed.    Estimated Creatinine Clearance: 11.4 mL/min (A) (by C-G formula based on SCr of 5.1 mg/dL (H)).   Assessment: 59 yo W on warfarin PTA for hx mechanical AVR/MVR (goal 2.5-3.5), reversed with vit K 10 mg IV + 2 units FFP  + 4 units PRBcs on 8/18 and another vit K po 5mg  on 9/7 with 2 unit RBCs 9/8-9/9 due to continued retroperitoneal bleed.  PTA Warfarin regimen: 9 mg daily except 6 mg on Mondays - last 8/18 (INR was 3.6 on admit 8/18)  Hep lvl now high 0.75 No bleeding per RN  Goal of Therapy:  Heparin level 0.3 -0.5 units/ml (low goal due to bleed) INR 2.5-3.5  Monitor platelets by anticoagulation protocol: Yes   Plan:  Reduce back to 1100 units/hr Daily heparin level, INR, CBC, s/s bleeding  Barth Kirks, PharmD, BCPS, BCCCP Clinical Pharmacist [phone]  Please check AMION for all [place] numbers  02/11/2020 9:12 PM

## 2020-02-11 NOTE — Progress Notes (Signed)
[name] for heparin, warfarin Indication: mechanical AVR/MVR  No Known Allergies  Patient Measurements: Height: 5\' 6"  (167.6 cm) Weight: 61.5 kg (135 lb 9.3 oz) (scale a) IBW/kg (Calculated) : 59.3 Heparin Dosing Weight: 62kg  Vital Signs: Temp: 98.6 F (37 C) (09/23 0501) Temp Source: Oral (09/23 0501) BP: 139/73 (09/23 0801) Pulse Rate: 93 (09/23 0801)  Labs: Recent Labs    02/09/20 0358 02/09/20 0358 02/10/20 0500 02/11/20 0927  HGB 9.0*   < > 9.1* 9.5*  HCT 28.8*  --  28.3* 30.7*  PLT 280  --  270 257  LABPROT 24.0*  --  21.6* 23.6*  INR 2.2*  --  2.0* 2.2*  HEPARINUNFRC 0.68  --  0.21* 0.22*  CREATININE 4.49*  --   --  5.10*   < > = values in this interval not displayed.    Estimated Creatinine Clearance: 11.4 mL/min (A) (by C-G formula based on SCr of 5.1 mg/dL (H)).   Assessment: 59 yo W on warfarin PTA for hx mechanical AVR/MVR (goal 2.5-3.5), reversed with vit K 10 mg IV + 2 units FFP  + 4 units PRBcs on 8/18 and another vit K po 5mg  on 9/7 with 2 unit RBCs 9/8-9/9 due to continued retroperitoneal bleed.  PTA Warfarin regimen: 9 mg daily except 6 mg on Mondays - last 8/18 (INR was 3.6 on admit 8/18)    Heparin level is subtherapeutic at 0.22, on 1150 units/hr. INR is still subtherapeutic at 2.2. Hgb stable at 9.5, plt 257. No s/sx of bleeding or infusion issues. Pharmacy asked to continue anticoagulation.  Goal of Therapy:  Heparin level 0.3 -0.5 units/ml (low goal due to bleed) INR 2.5-3.5  Monitor platelets by anticoagulation protocol: Yes   Plan:  Increase heparin gtt to 1200 units/hr >> will get level in 8 hr Warfarin 12 mg tonight  Daily heparin level, INR, CBC, s/s bleeding  Antonietta Jewel, PharmD, [place] Clinical Pharmacist  Phone: [phone] 02/11/2020 11:42 AM  Please check AMION for all [name] phone numbers After 10:00 PM, call [name] [phone]

## 2020-02-11 NOTE — Consult Note (Signed)
   [facility] Inpatient Consult   02/11/2020  [name] 1960/08/02 [id number]   [facility] [ACO] Patient: [name] Medicare   Patient screened for high risk score for unplanned readmission score for long length of stay hospitalization.  Have followed with inpatient TOC in progression meetings.  Review of patient's medical record reveals patient is currently being recommended for home health for transition of care.  Primary Care Provider is  Dr. Iona Beard Osei-Bonsu with [facility].  Plan:  Continue to follow progress and disposition to assess for post hospital care management needs.    For questions contact:   Natividad Brood, RN BSN [facility] Liaison  [phone] business mobile phone Toll free office [phone]  Fax number: [phone] [email or web address]

## 2020-02-11 NOTE — Progress Notes (Signed)
Physical Therapy Treatment Patient Details Name: [name] MRN: [id number] DOB: 17-Dec-1960 Today's Date: 02/11/2020    History of Present Illness Patient is a 59 year old female with history of ESRD on HD MWF, s/p aortic and mitral valve replacement on warfarin therapy who presented with nausea, nonbloody emesis and abdominal pain; found to have spontaneous left retroperitoneal bleed.  On 9/8 underwent Left renal angiogram and coil embolization.     PT Comments    Pt fully participated in session; pt progressing with high level balance activities during gait; pt with mild impairments noted; pt stated after standing exercises last session, muscles fatigued; pt will benefit from additional PT to address high level balance impairments as well as weakness and provide HEP to maximize independence with functional mobility prior to discharge.     Follow Up Recommendations  Home health PT     Equipment Recommendations  None recommended by PT    Recommendations for Other Services       Precautions / Restrictions Precautions Precautions: Fall Restrictions Weight Bearing Restrictions: No    Mobility  Bed Mobility Overal bed mobility: Modified Independent                Transfers Overall transfer level: Modified independent Equipment used: None Transfers: Sit to/from [facility] Sit to Stand: Modified independent (Device/Increase time) Stand pivot transfers: Modified independent (Device/Increase time)          Ambulation/Gait Ambulation/Gait assistance: Supervision Gait Distance (Feet): 1000 Feet Assistive device: None   Gait velocity: WFL   General Gait Details: pt without LOB, no device; pt performed lateral and vertical head movements, tandem walking on line, 360 degree turns R and L with S-min guard from therapist   Stairs             Wheelchair Mobility    Modified Rankin (Stroke Patients Only)       Balance                                             Cognition Arousal/Alertness: Awake/alert Behavior During Therapy: WFL for tasks assessed/performed Overall Cognitive Status: Within Functional Limits for tasks assessed                                 General Comments: VSS      Exercises      General Comments General comments (skin integrity, edema, etc.): static balance normal; ambulation on level surfaces normal, high level balance activities during ambulation mild impairments      Pertinent Vitals/Pain Pain Assessment: No/denies pain    Home Living                      Prior Function            PT Goals (current goals can now be found in the care plan section) Acute Rehab PT Goals Patient Stated Goal: manage her symptoms Time For Goal Achievement: 02/25/20 Potential to Achieve Goals: Good Progress towards PT goals: Progressing toward goals    Frequency    Min 3X/week      PT Plan Current plan remains appropriate    Co-evaluation              AM-PAC PT "6 Clicks" Mobility   Outcome Measure  Help  needed turning from your back to your side while in a flat bed without using bedrails?: None Help needed moving from lying on your back to sitting on the side of a flat bed without using bedrails?: None Help needed moving to and from a bed to a chair (including a wheelchair)?: None Help needed standing up from a chair using your arms (e.g., wheelchair or bedside chair)?: None Help needed to walk in hospital room?: None Help needed climbing 3-5 steps with a railing? : A Little 6 Click Score: 23    End of Session Equipment Utilized During Treatment: Gait belt Activity Tolerance: Patient tolerated treatment well Patient left: in bed;with call bell/phone within reach Nurse Communication: Mobility status PT Visit Diagnosis: Unsteadiness on feet (R26.81) Muscle Weakness (generalized) (M62.81)     Time: 2297-9892 PT Time Calculation  (min) (ACUTE ONLY): 16 min  Charges:  $Gait Training: 8-22 mins                     Lyanne Co, DPT Acute Rehabilitation Services [phone]   [place] 02/11/2020, 10:01 AM

## 2020-02-11 NOTE — Progress Notes (Signed)
PROGRESS NOTE  [name] ZMO:[id number] DOB: 23-Aug-1960 DOA: 01/06/2020 PCP: Benito Mccreedy, MD   LOS: 36 days   Brief narrative: As per HPI,  59 year old woman PMH including ESRD, status post aortic and mitral valve replacement on warfarin, presented to the hospital with flank pain.  CT revealed large spontaneous left retroperitoneal hematoma, INR was 3.6. Patient was seen by surgery, cardiology, PCCM, nephrology and urology. Patient required total 6 units PRBCs, 2 units FFP and vitamin K. About 10 days in, pt developed gross hematuria, imaging nondiagnostic for renal mass.  Patient was then seen by urology; given negative imaging recomendation was to followup as outpat w/ urology to further investigate. Developed flank pain about 2 weeks in, CT showed enlarging hematoma associate w/ left kidney. Underwent left renal angiogram and coil embolization. S/p post IR guided angiogram and embolization bleeding artery 9/8 and the patient was stablized. Hemoglobin was stabilized and warfarin resumed. Remains on heparin infusion until INR therapeutic then anticipate discharge home.  Assessment/Plan:  Principal Problem:   Retroperitoneal hemorrhage Active Problems:   End-stage renal disease on hemodialysis (HCC)   S/P mitral valve replacement with metallic valve   Nontraumatic retroperitoneal hematoma   Status post mechanical aortic valve replacement   Urethral bleeding   Hemorrhagic shock (HCC)   Aortic atherosclerosis (HCC)   Acquired thrombophilia (HCC)  Retroperitoneal hemorrhage Spontaneous.  Was treated with 6 units PRBC, vitamin K and 2 units FFP.  Underwent coil embolization of the renal artery thought to be secondary to renal bleeding causing retroperitoneal hematoma.  Currently hemoglobin stable.  Currently on Heparin drip and Coumadin.  INR is still subtherapeutic. Goal INR of 2.5 or more.  End-stage renal disease on hemodialysis. Continue hemodialysis as per  nephrology.  S/P mitral and aortic mechanical valve replacement Continue heparin infusion until INR therapeutic. Goal 2.5-3.5. Continue warfarin per pharmacy.  Still subtherapeutic.  Hematuria minimal likely old. None currently.Mild pink urine but lightening up.  Hemorrhagic shock  Secondary to retroperitoneal hematoma.  Resolved  Aortic atherosclerosis Consider statin on discharge.  Acquired thrombophilia  --secondary to mechanical AV and MV.  Continue heparin and warfarin until therapeutic.  DVT prophylaxis: SCDs Start: 01/06/20 1944 warfarin (COUMADIN) tablet 12 mg    Code Status: Full code  Family Communication: None  Status is: Inpatient  Remains inpatient appropriate because:Subtherapeutic INR   Dispo: The patient is from: Home              Anticipated d/c is to: Home              Anticipated d/c date is: 1 day when INR>2.5              Patient currently is not medically stable to d/c.   Consultants:  PCCM   General surgery  Nephrology  Cardiology  Urology  IR  Procedures:  IR guided angiogram and embolization on 01/27/2020.  Antibiotics:  . None  Anti-infectives (From admission, onward)   None      Subjective: Today, patient was seen and examined at bedside.  Denies any fever, chills, redness of breath or obvious bleeding.  Urine color has lightened up   Objective: Vitals:   02/11/20 0501 02/11/20 0801  BP: 126/81 139/73  Pulse: 87 93  Resp: 18   Temp: 98.6 F (37 C)   SpO2: 100% 98%    Intake/Output Summary (Last 24 hours) at 02/11/2020 1247 Last data filed at 02/11/2020 0800 Gross per 24 hour  Intake 1600.86 ml  Output  2800 ml  Net -1199.14 ml   Filed Weights   02/10/20 0549 02/10/20 1253 02/11/20 0501  Weight: 63.3 kg 60.5 kg 61.5 kg   Body mass index is 21.88 kg/m.   Physical Exam: GENERAL: Patient is alert awake and oriented. Not in obvious distress. HENT: Mild pallor noted.  Pupils equally reactive to light.  Oral mucosa is moist NECK: is supple, no gross swelling noted. CHEST: Clear to auscultation. No crackles or wheezes.  Diminished breath sounds bilaterally. CVS: S1 and S2 heard, no murmur. Regular rate and rhythm.  ABDOMEN: Soft, non-tender, bowel sounds are present. EXTREMITIES: No edema.  Right upper extremity fistula. CNS: Cranial nerves are intact. No focal motor deficits.  SKIN: warm and dry without rashes.  Data Review: I have personally reviewed the following laboratory data and studies,  CBC: Recent Labs  Lab 02/06/20 0604 02/08/20 0410 02/09/20 0358 02/10/20 0500 02/11/20 0927  WBC 10.0 11.4* 10.0 9.1 8.2  HGB 8.8* 8.6* 9.0* 9.1* 9.5*  HCT 29.1* 27.0* 28.8* 28.3* 30.7*  MCV 104.7* 101.5* 103.6* 105.2* 108.1*  PLT 305 296 280 270 749   Basic Metabolic Panel: Recent Labs  Lab 02/06/20 0604 02/07/20 1252 02/08/20 0410 02/09/20 0358 02/11/20 0927  NA 133* 130* 128* 132* 133*  K 4.0 4.3 4.4 4.2 4.1  CL 96* 92* 92* 95* 96*  CO2 25 21* 24 27 26   GLUCOSE 122* 96 91 101* 91  BUN 20 38* 47* 20 19  CREATININE 4.49* 7.18* 8.25* 4.49* 5.10*  CALCIUM 9.2 9.6 9.6 9.9 9.6  PHOS 3.2 4.0 4.3 3.8 3.9   Liver Function Tests: Recent Labs  Lab 02/06/20 0604 02/07/20 1252 02/08/20 0410 02/09/20 0358 02/11/20 0927  ALBUMIN 2.8* 2.8* 2.5* 2.7* 2.8*   No results for input(s): LIPASE, AMYLASE in the last 168 hours. No results for input(s): AMMONIA in the last 168 hours. Cardiac Enzymes: No results for input(s): CKTOTAL, CKMB, CKMBINDEX, TROPONINI in the last 168 hours. BNP (last 3 results) No results for input(s): BNP in the last 8760 hours.  ProBNP (last 3 results) No results for input(s): PROBNP in the last 8760 hours.  CBG: No results for input(s): GLUCAP in the last 168 hours. No results found for this or any previous visit (from the past 240 hour(s)).   Studies: No results found.     Flora Lipps, MD  [facility] 02/11/2020

## 2020-02-12 LAB — CBC
HCT: 28.9 % — ABNORMAL LOW (ref 36.0–46.0)
Hemoglobin: 9.1 g/dL — ABNORMAL LOW (ref 12.0–15.0)
MCH: 33.5 pg (ref 26.0–34.0)
MCHC: 31.5 g/dL (ref 30.0–36.0)
MCV: 106.3 fL — ABNORMAL HIGH (ref 80.0–100.0)
Platelets: 257 10*3/uL (ref 150–400)
RBC: 2.72 MIL/uL — ABNORMAL LOW (ref 3.87–5.11)
RDW: 19.7 % — ABNORMAL HIGH (ref 11.5–15.5)
WBC: 8.1 10*3/uL (ref 4.0–10.5)
nRBC: 0.4 % — ABNORMAL HIGH (ref 0.0–0.2)

## 2020-02-12 LAB — PROTIME-INR
INR: 2.6 — ABNORMAL HIGH (ref 0.8–1.2)
Prothrombin Time: 26.8 seconds — ABNORMAL HIGH (ref 11.4–15.2)

## 2020-02-12 LAB — HEPARIN LEVEL (UNFRACTIONATED): Heparin Unfractionated: 0.27 IU/mL — ABNORMAL LOW (ref 0.30–0.70)

## 2020-02-12 MED ORDER — SENNA 8.6 MG PO TABS: 1.0000 | ORAL_TABLET | Freq: Every day | ORAL | 0 refills | Status: DC

## 2020-02-12 MED ORDER — POLYETHYLENE GLYCOL 3350 17 G PO PACK: 17.0000 g | PACK | Freq: Every day | ORAL | 0 refills | Status: DC | PRN

## 2020-02-12 MED ORDER — CINACALCET HCL 30 MG PO TABS: 120.0000 mg | ORAL_TABLET | ORAL | 0 refills | Status: AC

## 2020-02-12 MED ORDER — WARFARIN SODIUM 6 MG PO TABS
9.0000 mg | ORAL_TABLET | Freq: Once | ORAL | Status: DC
  Filled 2020-02-12: qty 1

## 2020-02-12 MED ORDER — CALCITRIOL 0.5 MCG PO CAPS: 2.0000 ug | ORAL_CAPSULE | ORAL | 0 refills | Status: AC

## 2020-02-12 MED ORDER — ACETAMINOPHEN 325 MG PO TABS
ORAL_TABLET | ORAL | Status: AC
  Administered 2020-02-12: 650 mg via ORAL
  Filled 2020-02-12: qty 2

## 2020-02-12 MED ORDER — HEPARIN SODIUM (PORCINE) 1000 UNIT/ML DIALYSIS: 1800.0000 [IU] | Freq: Once | INTRAMUSCULAR | Status: DC

## 2020-02-12 MED ORDER — OXYCODONE HCL 5 MG PO TABS
10.0000 mg | ORAL_TABLET | Freq: Four times a day (QID) | ORAL | 0 refills | Status: AC | PRN
Start: 2020-02-12 — End: 2020-02-17

## 2020-02-12 MED ORDER — METOPROLOL TARTRATE 25 MG PO TABS: 25.0000 mg | ORAL_TABLET | Freq: Two times a day (BID) | ORAL | 2 refills | Status: AC

## 2020-02-12 MED ORDER — PANTOPRAZOLE SODIUM 40 MG PO TBEC: 40.0000 mg | DELAYED_RELEASE_TABLET | Freq: Every day | ORAL | Status: AC

## 2020-02-12 NOTE — Progress Notes (Signed)
Nephrology Follow-Up Consult note   Summary: [name] is a/an 59 y.o. female with a past medical history significant for ESRD, aortic and mitral valve replacement on Coumadin, admitted for retroperitoneal bleed.      Exam: Constitutional: well-appearing, no acute distress ENMT: ears and nose without scars or lesions, MMM CV: normal rate, no edema Respiratory: bilateral chest rise, normal work of breathing Gastrointestinal: soft, NABS Skin: no visible lesions or rashes Nonfocal, Ox 3   OP HD:MWF  4h 61kg 450/800 2/2 bath P2 Hep 1800 -sensipar 180mg  -calcitriol 87mcg -no esa, no iron  Problems: 1.Lretroperitonealperirenal bleed/hematoma - Bleed seen on CT 8/18.  Status post multiple blood products requiring IV heparin for bridge given history of valve replacement.  Now status post renal embolization with VIR.  Hemoglobin has stabilized.  - coumadin restarted 9/10, continues on IV heparin, INR rising appropriately, nearing goal of 2.5. INR 2.6, going home.  3.Hx AVR/MVR-anticoagulation as above 4. ESRD- on HD MWF. HD tomorrow. 5. Anemiaof CKD- As above S/p 6 units pRBC. TSAT 84% 8/30.  Aranesp 7mcg qweekly started on 9/13. Consider increasing if needed 6. Secondary hyperparathyroidism- PTH 109 (has been here 30 days). Cont calcitriol. sensipar decreased 180->120mg  three times weekly. Continue decreased dose of Auryxia 210 mg 3 times daily.  7.HTN/volume-[name] 60.5kg.  Overall stable.  Blood pressure acceptable 8. Nutrition- Renal diet w/fluid restrictions. Alb 2.8. Protein supplements.     [facility] 02/12/2020 12:28 PM  ___________________________________________________________  CC: ESRD  Interval History/Subjective: Patient feels well with no complaints today. Seen walking in the halls.    Medications:  Current Facility-Administered Medications  Medication Dose Route Frequency Provider Last Rate  Last Admin  . 0.9 %  sodium chloride infusion   Intravenous PRN Charlynne Cousins, MD      . acetaminophen (TYLENOL) tablet 650 mg  650 mg Oral Q6H PRN Charlynne Cousins, MD   650 mg at 02/12/20 0823  . calcitRIOL (ROCALTROL) capsule 2 mcg  2 mcg Oral Q M,W,F Gean Quint, MD   2 mcg at 02/12/20 1205  . Chlorhexidine Gluconate Cloth 2 % PADS 6 each  6 each Topical Q0600 Samuella Cota, MD   6 each at 02/12/20 [phone]  . cinacalcet (SENSIPAR) tablet 120 mg  120 mg Oral Q M,W,F-HD Reesa Chew, MD   120 mg at 02/12/20 1211  . Darbepoetin Alfa (ARANESP) injection 60 mcg  60 mcg Intravenous Q Mon-HD Reesa Chew, MD   60 mcg at 02/08/20 1020  . docusate sodium (COLACE) capsule 100 mg  100 mg Oral Daily Bowser, Laurel Dimmer, NP   100 mg at 02/12/20 1208  . ferric citrate (AURYXIA) tablet 210 mg  210 mg Oral TID WC Reesa Chew, MD   210 mg at 02/12/20 1205  . hydrALAZINE (APRESOLINE) injection 10 mg  10 mg Intravenous Q6H PRN Debbe Odea, MD   10 mg at 01/24/20 1153  . hydrocortisone cream 1 %   Topical QID PRN Shalhoub, Sherryll Burger, MD   Given at 02/01/20 0230  . iohexol (OMNIPAQUE) 300 MG/ML solution 150 mL  150 mL Intra-arterial Once PRN Jacqulynn Cadet, MD      . metoprolol tartrate (LOPRESSOR) tablet 25 mg  25 mg Oral BID Fay Records, MD   25 mg at 02/12/20 1206  . multivitamin (RENA-VIT) tablet 1 tablet  1 tablet Oral QHS Roney Jaffe, MD   1 tablet at 02/11/20 2122  . ondansetron (ZOFRAN)  injection 4 mg  4 mg Intravenous Q6H PRN Debbe Odea, MD   4 mg at 02/12/20 0000  . ondansetron (ZOFRAN-ODT) disintegrating tablet 4 mg  4 mg Oral Q8H PRN Icard, Bradley L, DO   4 mg at 01/18/20 1818  . oxyCODONE (Oxy IR/ROXICODONE) immediate release tablet 10 mg  10 mg Oral Q4H PRN Debbe Odea, MD   10 mg at 02/11/20 1708  . pantoprazole (PROTONIX) EC tablet 40 mg  40 mg Oral QAC breakfast Donnamae Jude, RPH   40 mg at 02/12/20 1205  . polyethylene glycol (MIRALAX / GLYCOLAX) packet  17 g  17 g Oral BID Charlynne Cousins, MD   17 g at 02/11/20 0802  . senna (SENOKOT) tablet 8.6 mg  1 tablet Oral Daily Bowser, Grace E, NP   8.6 mg at 02/11/20 0802  . simethicone (MYLICON) chewable tablet 80 mg  80 mg Oral TID Cristal Generous, NP   80 mg at 02/12/20 1205  . warfarin (COUMADIN) tablet 9 mg  9 mg Oral ONCE-1600 Pokhrel, Laxman, MD      . Warfarin - Pharmacist Dosing Inpatient   Does not apply q1600 Karren Cobble, [facility]   Given at 02/11/20 1657      Review of Systems: 10 systems reviewed and negative except per interval history/subjective  Physical Exam: Vitals:   02/12/20 1206 02/12/20 1207  BP: 114/76 114/76  Pulse: (!) 102 (!) 102  Resp:  20  Temp:  98.7 F (37.1 C)  SpO2:  100%   Total I/O In: -  Out: 2500 [Other:2500]  Intake/Output Summary (Last 24 hours) at 02/12/2020 1228 Last data filed at 02/12/2020 1104 Gross per 24 hour  Intake 403.42 ml  Output 2650 ml  Net -2246.58 ml    Test Results I personally reviewed new and old clinical labs and radiology tests Lab Results  Component Value Date   NA 133 (L) 02/11/2020   K 4.1 02/11/2020   CL 96 (L) 02/11/2020   CO2 26 02/11/2020   BUN 19 02/11/2020   CREATININE 5.10 (H) 02/11/2020   CALCIUM 9.6 02/11/2020   ALBUMIN 2.8 (L) 02/11/2020   PHOS 3.9 02/11/2020

## 2020-02-12 NOTE — Progress Notes (Signed)
[place] for heparin, warfarin Indication: mechanical AVR/MVR  No Known Allergies  Patient Measurements: Height: 5\' 6"  (167.6 cm) Weight: 63 kg (138 lb 14.2 oz) IBW/kg (Calculated) : 59.3 Heparin Dosing Weight: 62kg  Vital Signs: Temp: 97.7 F (36.5 C) (09/24 0750) Temp Source: Oral (09/24 0750) BP: 144/78 (09/24 0900) Pulse Rate: 85 (09/24 0900)  Labs: Recent Labs    02/10/20 0500 02/10/20 0500 02/11/20 0927 02/11/20 2038 02/12/20 0828  HGB 9.1*   < > 9.5*  --  9.1*  HCT 28.3*  --  30.7*  --  28.9*  PLT 270  --  257  --  257  LABPROT 21.6*  --  23.6*  --  26.8*  INR 2.0*  --  2.2*  --  2.6*  HEPARINUNFRC 0.21*   < > 0.22* 0.75* 0.27*  CREATININE  --   --  5.10*  --   --    < > = values in this interval not displayed.    Estimated Creatinine Clearance: 11.4 mL/min (A) (by C-G formula based on SCr of 5.1 mg/dL (H)).   Assessment: 59 yo W on warfarin PTA for hx mechanical AVR/MVR (goal 2.5-3.5), reversed with vit K 10 mg IV + 2 units FFP  + 4 units PRBcs on 8/18 and another vit K po 5mg  on 9/7 with 2 unit RBCs 9/8-9/9 due to continued retroperitoneal bleed.  PTA Warfarin regimen: 9 mg daily except 6 mg on Mondays - last 8/18 (INR was 3.6 on admit 8/18)    Heparin level is subtherapeutic at 0.27, on 1100 units/hr. INR is therapeutic today at 2.6 - will discontinue heparin infusion. Hgb stable at 9.1, plt 257. No s/sx of bleeding or infusion issues.  Goal of Therapy:  Heparin level 0.3 -0.5 units/ml (low goal due to bleed) INR 2.5-3.5  Monitor platelets by anticoagulation protocol: Yes   Plan:  Discontinue heparin infusion Warfarin 9 mg tonight  Daily heparin level, INR, CBC, s/s bleeding  Antonietta Jewel, PharmD, [place] Clinical Pharmacist  Phone: [phone] 02/12/2020 9:10 AM  Please check AMION for all [place] phone numbers After 10:00 PM, call [place] [phone]

## 2020-02-12 NOTE — Progress Notes (Signed)
D/C instructions given and reviewed. Questions answered and encouraged to call with any further concerns. IV's removed, tolerated fair.

## 2020-02-12 NOTE — Discharge Summary (Signed)
Physician Discharge Summary  [name] ZSW:[id number] DOB: 30-Nov-1960 DOA: 01/06/2020  PCP: Benito Mccreedy, MD  Admit date: 01/06/2020 Discharge date: 02/12/2020  Admitted From: Home  Discharge disposition: home   Recommendations for Outpatient Follow-Up:   . Follow up with your primary care provider in one week.  . Check CBC, BMP, magnesium in the next visit . Follow-up in the Coumadin clinic for your INR checkup.  Discharge Diagnosis:   Principal Problem:   Retroperitoneal hemorrhage Active Problems:   End-stage renal disease on hemodialysis (HCC)   S/P mitral valve replacement with metallic valve   Nontraumatic retroperitoneal hematoma   Status post mechanical aortic valve replacement   Urethral bleeding   Hemorrhagic shock (HCC)   Aortic atherosclerosis (HCC)   Acquired thrombophilia ([place])    Discharge Condition: Improved.  Diet recommendation: Low sodium, heart healthy.   Wound care: None.  Code status: Full.   History of Present Illness:   59 year old woman PMH including ESRD, status post aortic and mitral valve replacement on warfarin, presented to the hospital with flank pain.  CT revealed large spontaneous left retroperitoneal hematoma, INR was 3.6. Patient was seen by surgery, cardiology, PCCM, nephrology and urology. Patient required total 6 units PRBCs, 2 units FFP and vitamin K. About 10 days in, pt developed gross hematuria, imaging nondiagnostic for renal mass.  Patient was then seen by urology; given negative imaging recomendation was to followup as outpat w/ urology to further investigate. Developed flank pain about 2 weeks in, CT showed enlarging hematoma associate w/ left kidney. Underwent left renal angiogram and coil embolization. S/p post IR guided angiogram and embolization bleeding artery 9/8 and the patient was stablized. Hemoglobin was stabilized and warfarin resumed.   Hospital Course:   Following conditions were addressed  during hospitalization as listed below,  Retroperitoneal hemorrhage Spontaneous.  Was treated with6 units PRBC, vitamin K and 2 units FFP.  Underwent coil embolization of the renal artery thought to be secondary to renal bleeding causing retroperitoneal hematoma.  Patient was subsequently started on heparin drip and Coumadin.  INR was therapeutic at 2.6 on discharge. INR goal of 2.2-3.5.  End-stage renal disease on hemodialysis. Continue hemodialysis as per nephrology as outpatient.  S/P mitral and aortic mechanical valve replacement Continue Coumadin on discharge.  Hematuria improved.  Hemorrhagic shock  Secondary to retroperitoneal hematoma.  Resolved   Aortic atherosclerosis Could consider statins but patient is end-stage renal disease.  Will have primary care physician assess on the  benefits.  Acquired thrombophilia   --secondary to mechanical AV and MV  on warfarin.  Disposition.  At this time, patient is stable for disposition home.  Medical Consultants:    PCCM   General surgery  Nephrology  Cardiology  Urology  IR  Procedures:     IR guided angiogram and embolization on 01/27/2020. Subjective:   Today, patient feels okay.  Denies any shortness of breath, cough,, fever, hematuria.  Denies any back pain, nausea or vomiting.  Discharge Exam:   Vitals:   02/12/20 0830 02/12/20 0900  BP: (!) 147/77 (!) 144/78  Pulse: 98 85  Resp:    Temp:    SpO2:     Vitals:   02/12/20 0758 02/12/20 0802 02/12/20 0830 02/12/20 0900  BP: (!) 145/80  (!) 147/77 (!) 144/78  Pulse: 100 (!) 102 98 85  Resp:      Temp:      TempSrc:      SpO2:      Weight:  Height:       General: Alert awake, not in obvious distress, thinly built HENT: pupils equally reacting to light, mild pallor noted oral mucosa is moist.  Chest:  Clear breath sounds.  Diminished breath sounds bilaterally. No crackles or wheezes.  CVS: S1 &S2 heard. No murmur.  Regular rate and  rhythm. Abdomen: Soft, nontender, nondistended.  Bowel sounds are heard.   Extremities: No cyanosis, clubbing or edema.  Peripheral pulses are palpable.  Right upper extremity fistula in place. Psych: Alert, awake and oriented, normal mood CNS:  No cranial nerve deficits.  Power equal in all extremities.   Skin: Warm and dry.  No rashes noted.  The results of significant diagnostics from this hospitalization (including imaging, microbiology, ancillary and laboratory) are listed below for reference.     Diagnostic Studies:   DG Chest Portable 1 View  Result Date: 01/06/2020 CLINICAL DATA:  59 year old female with central line placement. EXAM: PORTABLE CHEST 1 VIEW COMPARISON:  Chest radiograph dated 02/05/2019. FINDINGS: Left IJ central venous line tip close to the cavoatrial junction. There are minimal bibasilar atelectasis. No focal consolidation, pleural effusion, or pneumothorax. Stable cardiac silhouette. Median sternotomy wires and mechanical cardiac valve. Atherosclerotic calcification of the aorta. No acute osseous pathology. IMPRESSION: Left IJ central venous line with tip close to the cavoatrial junction. No pneumothorax. Electronically Signed   By: Anner Crete M.D.   On: 01/06/2020 22:08   CT RENAL STONE STUDY  Result Date: 01/06/2020 CLINICAL DATA:  Left flank pain EXAM: CT ABDOMEN AND PELVIS WITHOUT CONTRAST TECHNIQUE: Multidetector CT imaging of the abdomen and pelvis was performed following the standard protocol without IV contrast. COMPARISON:  01/06/2019 FINDINGS: Lower chest: Areas of linear consolidation at the lung bases likely reflect atelectasis or scarring. Postsurgical changes are seen from aortic and mitral valve replacements. Hepatobiliary: No focal liver abnormality is seen. No gallstones, gallbladder wall thickening, or biliary dilatation. Pancreas: The head and body of the pancreas are unremarkable. The tail the pancreas is difficult to visualize given an adjacent  process within the left retroperitoneum. No definite intraparenchymal abnormalities. Spleen: Diffuse splenic calcifications unchanged. Adrenals/Urinary Tract: Right kidney is markedly atrophic with numerous small renal cortical cyst. The left kidney is obscured by a large heterogeneous process in the left renal fossa measuring 8.7 x 6.7 cm in transverse dimension, and measuring 6.7 cm in craniocaudal length. Differential includes renal hemorrhage or hemorrhagic renal neoplasm. Evaluation is limited without IV contrast. Fat stranding extends in the retroperitoneum inferior to the left kidney consistent with retroperitoneal hemorrhage. Stomach/Bowel: No bowel obstruction or ileus. Scattered diverticulosis of the sigmoid colon without diverticulitis. Vascular/Lymphatic: Extensive vascular calcifications are seen throughout the aorta and its branches. No pathologic adenopathy. Reproductive: Calcified uterine fibroids are noted. No adnexal masses. Other: No free fluid or free gas.  No abdominal wall hernia. Musculoskeletal: Bones are diffusely sclerotic consistent with renal osteodystrophy and history of end-stage renal disease. No acute or destructive bony lesions. IMPRESSION: 1. Large left-sided retroperitoneal hematoma centered in the left renal fossa. Differential includes spontaneous perirenal hemorrhage versus hemorrhagic renal mass. 2. Diverticulosis without diverticulitis. 3.  Aortic Atherosclerosis (ICD10-I70.0). These results were called by telephone at the time of interpretation on 01/06/2020 at 4:39pm to provider DR. Ralene Bathe, who verbally acknowledged these results. Electronically Signed   By: Randa Ngo M.D.   On: 01/06/2020 16:47     Labs:   Basic Metabolic Panel: Recent Labs  Lab 02/06/20 0604 02/06/20 0604 02/07/20 1252 02/07/20 1252 02/08/20 0410  02/08/20 0410 02/09/20 0358 02/11/20 0927  NA 133*  --  130*  --  128*  --  132* 133*  K 4.0   < > 4.3   < > 4.4   < > 4.2 4.1  CL 96*  --   92*  --  92*  --  95* 96*  CO2 25  --  21*  --  24  --  27 26  GLUCOSE 122*  --  96  --  91  --  101* 91  BUN 20  --  38*  --  47*  --  20 19  CREATININE 4.49*  --  7.18*  --  8.25*  --  4.49* 5.10*  CALCIUM 9.2  --  9.6  --  9.6  --  9.9 9.6  PHOS 3.2  --  4.0  --  4.3  --  3.8 3.9   < > = values in this interval not displayed.   GFR Estimated Creatinine Clearance: 11.4 mL/min (A) (by C-G formula based on SCr of 5.1 mg/dL (H)). Liver Function Tests: Recent Labs  Lab 02/06/20 0604 02/07/20 1252 02/08/20 0410 02/09/20 0358 02/11/20 0927  ALBUMIN 2.8* 2.8* 2.5* 2.7* 2.8*   No results for input(s): LIPASE, AMYLASE in the last 168 hours. No results for input(s): AMMONIA in the last 168 hours. Coagulation profile Recent Labs  Lab 02/08/20 0410 02/09/20 0358 02/10/20 0500 02/11/20 0927 02/12/20 0828  INR 2.1* 2.2* 2.0* 2.2* 2.6*    CBC: Recent Labs  Lab 02/08/20 0410 02/09/20 0358 02/10/20 0500 02/11/20 0927 02/12/20 0828  WBC 11.4* 10.0 9.1 8.2 8.1  HGB 8.6* 9.0* 9.1* 9.5* 9.1*  HCT 27.0* 28.8* 28.3* 30.7* 28.9*  MCV 101.5* 103.6* 105.2* 108.1* 106.3*  PLT 296 280 270 257 257   Cardiac Enzymes: No results for input(s): CKTOTAL, CKMB, CKMBINDEX, TROPONINI in the last 168 hours. BNP: Invalid input(s): POCBNP CBG: No results for input(s): GLUCAP in the last 168 hours. D-Dimer No results for input(s): DDIMER in the last 72 hours. Hgb A1c No results for input(s): HGBA1C in the last 72 hours. Lipid Profile No results for input(s): CHOL, HDL, LDLCALC, TRIG, CHOLHDL, LDLDIRECT in the last 72 hours. Thyroid function studies No results for input(s): TSH, T4TOTAL, T3FREE, THYROIDAB in the last 72 hours.  Invalid input(s): FREET3 Anemia work up No results for input(s): VITAMINB12, FOLATE, FERRITIN, TIBC, IRON, RETICCTPCT in the last 72 hours. Microbiology No results found for this or any previous visit (from the past 240 hour(s)).   Discharge Instructions:    Discharge Instructions    Call MD for:   Complete by: As directed    Worsening symptoms, bleeding.   Diet - low sodium heart healthy   Complete by: As directed    Discharge instructions   Complete by: As directed    Follow-up with your primary care physician in 1 week.  Continue to take medications as prescribed.  Please seek medical attention if experiencing any bleeding.  Continue to follow-up in the Coumadin clinic.   Increase activity slowly   Complete by: As directed    No wound care   Complete by: As directed      Allergies as of 02/12/2020   No Known Allergies     Medication List    STOP taking these medications   amLODipine 10 MG tablet Commonly known as: NORVASC   amoxicillin 500 MG capsule Commonly known as: AMOXIL   aspirin EC 81 MG tablet  TAKE these medications   Auryxia 1 GM 210 MG(Fe) tablet Generic drug: ferric citrate Take 840 mg by mouth 3 (three) times daily.   calcitRIOL 0.5 MCG capsule Commonly known as: ROCALTROL Take 4 capsules (2 mcg total) by mouth every Monday, Wednesday, and Friday.   cinacalcet 30 MG tablet Commonly known as: SENSIPAR Take 4 tablets (120 mg total) by mouth every Monday, Wednesday, and Friday with hemodialysis.   metoprolol tartrate 25 MG tablet Commonly known as: LOPRESSOR Take 1 tablet (25 mg total) by mouth 2 (two) times daily. What changed: how much to take   multivitamin Tabs tablet Take 1 tablet by mouth at bedtime.   ondansetron 4 MG tablet Commonly known as: ZOFRAN Take 4 mg by mouth every 6 (six) hours as needed for nausea or vomiting.   oxyCODONE 5 MG immediate release tablet Commonly known as: Oxy IR/ROXICODONE Take 2 tablets (10 mg total) by mouth every 6 (six) hours as needed for up to 5 days for moderate pain.   pantoprazole 40 MG tablet Commonly known as: PROTONIX Take 1 tablet (40 mg total) by mouth daily. What changed: when to take this   polyethylene glycol 17 g packet Commonly known  as: MIRALAX / GLYCOLAX Take 17 g by mouth daily as needed for mild constipation or moderate constipation.   senna 8.6 MG Tabs tablet Commonly known as: SENOKOT Take 1 tablet (8.6 mg total) by mouth daily.   warfarin 3 MG tablet Commonly known as: COUMADIN Take as directed. If you are unsure how to take this medication, talk to your nurse or doctor. Original instructions: Take 3 tablets daily except 2 tablets on Mondays and Fridays by mouth or as directed by Coumadin Clinic. What changed:   how much to take  how to take this  when to take this  additional instructions       Follow-up Information    Osei-Bonsu, Iona Beard, MD. Schedule an appointment as soon as possible for a visit in 1 week(s).   Specialty: Internal Medicine Why: regular check up Contact information: [address] [place] [address] [phone]        Fay Records, MD .   Specialty: Cardiology Contact information: [place] [address] [phone]                Time coordinating discharge: 39 minutes  Signed:  Jeanny Rymer  [facility] 02/12/2020, 10:15 AM

## 2020-02-12 NOTE — Plan of Care (Signed)
  Problem: Health Behavior/Discharge Planning: Goal: Ability to manage health-related needs will improve Outcome: Adequate for Discharge   Problem: Clinical Measurements: Goal: Ability to maintain clinical measurements within normal limits will improve Outcome: Adequate for Discharge Goal: Will remain free from infection Outcome: Adequate for Discharge Goal: Diagnostic test results will improve Outcome: Adequate for Discharge Goal: Respiratory complications will improve Outcome: Adequate for Discharge Goal: Cardiovascular complication will be avoided Outcome: Adequate for Discharge   Problem: Activity: Goal: Risk for activity intolerance will decrease Outcome: Adequate for Discharge   Problem: Elimination: Goal: Will not experience complications related to urinary retention Outcome: Adequate for Discharge   Problem: Education: Goal: Ability to demonstrate management of disease process will improve Outcome: Adequate for Discharge Goal: Ability to verbalize understanding of medication therapies will improve Outcome: Adequate for Discharge Goal: Individualized Educational Video(s) Outcome: Adequate for Discharge   Problem: Activity: Goal: Capacity to carry out activities will improve Outcome: Adequate for Discharge   Problem: Cardiac: Goal: Ability to achieve and maintain adequate cardiopulmonary perfusion will improve Outcome: Adequate for Discharge

## 2020-02-13 ENCOUNTER — Telehealth: Payer: Self-pay | Admitting: Nephrology

## 2020-02-13 NOTE — Telephone Encounter (Signed)
Transition of care contact from inpatient facility  Date of Discharge: 02/12/20 Date of Contact: 02/13/20 Method of contact: phone Talked with: patient  Patient contact to discuss transition of care from recent inpatient hospitalization. Pateint was admitted to [facility] from : 9/18 - 02/12/20  with the diagnosis of retroperitoneal hemorrhagic with shock, hx of mech valve on chronic coumain  Medication changes were reviewed. She took coumadin as per d/c sheet last night and will call coumadin clinic Monday for appt.  She has made urology appt for next Tuesday  Patient will follow up at outpatient dialysis on Monday 9/27  Other follow up needs: none  Amalia Hailey, PA-C [facility] Pager:  [phone]

## 2020-02-16 ENCOUNTER — Other Ambulatory Visit: Payer: Self-pay | Admitting: *Deleted

## 2020-02-16 ENCOUNTER — Ambulatory Visit (INDEPENDENT_AMBULATORY_CARE_PROVIDER_SITE_OTHER): Payer: Medicare HMO

## 2020-02-16 ENCOUNTER — Other Ambulatory Visit: Payer: Self-pay

## 2020-02-16 DIAGNOSIS — Z952 Presence of prosthetic heart valve: Secondary | ICD-10-CM | POA: Diagnosis not present

## 2020-02-16 DIAGNOSIS — Z954 Presence of other heart-valve replacement: Secondary | ICD-10-CM

## 2020-02-16 DIAGNOSIS — Z5181 Encounter for therapeutic drug level monitoring: Secondary | ICD-10-CM | POA: Diagnosis not present

## 2020-02-16 LAB — POCT INR: INR: 4.6 — AB (ref 2.0–3.0)

## 2020-02-16 NOTE — Patient Outreach (Signed)
[facility]) Care Management  02/16/2020  [name] 1961/05/10 [id number]   Subjective: Telephone call to patient's home  / mobile number, someone answered, states [name] is currently at an appointment, will call back and/ or  requested call back.    Objective:  Per KPN (Knowledge Performance Now, point of care tool) and chart review, patient hospitalized 01/06/2020 - 02/12/2020 for Retroperitoneal hemorrhage, Hemorrhagic shock,Urethral bleeding  .   Patient also has a history of the following: status post mitral valve replacement with metallic valve, End-stage renal disease on hemodialysis (Mondays, Wednesdays, Fridays), Aortic atherosclerosis, and Acquired thrombophilia.     Assessment: Received Bernadene Person [facility] Liaison transition of care referral (patient post hospitalization, primary MD does not completed transition of care follow up) on 02/12/2020.   Referral source: Natividad Brood.   Referral reason: Please assign to Kimberly Coordinator for complex care and disease management follow up calls and assess for further needs.    Transition of care follow up pending patient contact.      Plan: RNCM will send unsuccessful outreach letter, [facility] pamphlet, will call patient for 2nd telephone outreach attempt within 4 business days, transition of care follow up, and proceed with case closure, after 4th unsuccessful outreach call.     Lalena Salas H. Annia Friendly, BSN, [facility] Telephonic CM Phone: [phone] Fax: [phone]

## 2020-02-16 NOTE — Patient Instructions (Signed)
Description   Skip today and tomorrow's dosage of Warfarin, then start taking Warfarin 3 tablets daily except for 2 tablets on Mondays and Fridays. Recheck INR in 1 week. Call Coumadin clinic with any problems [phone].

## 2020-02-22 ENCOUNTER — Encounter: Payer: Self-pay | Admitting: *Deleted

## 2020-02-22 ENCOUNTER — Other Ambulatory Visit: Payer: Self-pay | Admitting: *Deleted

## 2020-02-22 NOTE — Patient Outreach (Signed)
[facility]) Care Management  [place]  02/22/2020   [name] Oct 13, 1960 [id number]  Subjective: Telephone call to patient's home / mobile number, times 2, spoke with patient, and HIPAA verified.  Discussed [facility] Care Management [employer] Medicare Transition of care follow up, patient voiced understanding, and is in agreement to follow up.   Patient states she is doing okay and is currently at dialysis.  Discussed importance of hospital follow up with primary MD, patient voices understanding, and states she will follow up as appropriate.   States she has a follow up appointment with primary MD on 02/25/2020 and with cardiologist on 03/03/2020.   States she had an appointment with Coumadin clinic on 928/2021 and appointment went well.   Patient states she is aware of signs/ symptoms to report,  RNCM educated patient on how to reach provider if needed after hours, when to go to ED, and / or call 911.   States she is planning to discuss with cardiologist rationale for aspirin discontinuation at follow up appointment.   Patient states she is able to manage self care and has assistance as needed.  Patient voices understanding of medical diagnosis and treatment plan.  States she is accessing her [name] Medicare benefits as needed via member services number on back of card.  States she is planning to obtain a list of Teacher, early years/pre from [employer].   Discussed Advanced Directives, advised of [place] Management  Advanced Directives packet resource, patient voices understanding, and is in agreement to be sent packet at this time.   RNCM advised of [employer] document scanning  system for Advanced Directives documents via local providers office or hospitals, patient  voices understanding, and she will discuss accessing through patient's primary provider if needed in the future.  Patient states she does not have any transition of care, care coordination, disease management,  disease monitoring, transportation, or pharmacy needs at this time.   Patient declines enrollment in transition of care program at time and is in agreement with 1 additional call to assess for care management needs.  RNCM advised patient, moving to another position after 03/03/2020, patient will be transitioned to another [facility] in the future.  States she has this RNCM's and [place] Management contact information, will call if assistance needed prior to next patent outreach.   States she is very appreciative of the follow up and is in agreement to receive [place] Management information / services.        Objective: Per KPN (Knowledge Performance Now, point of care tool) and chart review, patient hospitalized 01/06/2020 - 02/12/2020 for Retroperitoneal hemorrhage, Hemorrhagic shock,Urethral bleeding  .   Patient also has a history of the following: status post mitral valve replacement with metallic valve, End-stage renal disease on hemodialysis (Mondays, Wednesdays, Fridays), Aortic atherosclerosis, andAcquired thrombophilia.    Encounter Medications:  Outpatient Encounter Medications as of 02/22/2020  Medication Sig Note   AURYXIA 1 GM 210 MG(Fe) tablet Take 840 mg by mouth 3 (three) times daily.    bisacodyl (DULCOLAX) 5 MG EC tablet Take 5 mg by mouth daily as needed for moderate constipation.    calcitRIOL (ROCALTROL) 0.5 MCG capsule Take 4 capsules (2 mcg total) by mouth every Monday, Wednesday, and Friday.    cinacalcet (SENSIPAR) 30 MG tablet Take 4 tablets (120 mg total) by mouth every Monday, Wednesday, and Friday with hemodialysis.    metoprolol tartrate (LOPRESSOR) 25 MG tablet Take 1 tablet (25 mg total)  by mouth 2 (two) times daily.    multivitamin (RENA-VIT) TABS tablet Take 1 tablet by mouth at bedtime.     ondansetron (ZOFRAN) 4 MG tablet Take 4 mg by mouth every 6 (six) hours as needed for nausea or vomiting.    pantoprazole (PROTONIX) 40 MG tablet Take 1 tablet (40 mg total)  by mouth daily.    polyethylene glycol (MIRALAX / GLYCOLAX) 17 g packet Take 17 g by mouth daily as needed for mild constipation or moderate constipation.    warfarin (COUMADIN) 3 MG tablet Take 3 tablets daily except 2 tablets on Mondays and Fridays by mouth or as directed by Coumadin Clinic. (Patient taking differently: Take 6-9 mg by mouth See admin instructions. Take 3 tablets (=9mg ) daily except 2 tablets (= 6 mg) every Monday by mouth or as directed by Coumadin Clinic.)    amoxicillin (AMOXIL) 500 MG capsule Take 4 capsules (2,000 mg total) by mouth once as needed (1 hour prior to any dental appointment/procedure including cleaning). (Patient not taking: Reported on 02/22/2020) 02/22/2020: States has on hand and takes as needed    senna (SENOKOT) 8.6 MG TABS tablet Take 1 tablet (8.6 mg total) by mouth daily. (Patient not taking: Reported on 02/22/2020)    No facility-administered encounter medications on file as of 02/22/2020.    Functional Status:  In your present state of health, do you have any difficulty performing the following activities: 01/17/2020 01/07/2020  Hearing? - N  Vision? - N  Difficulty concentrating or making decisions? - N  Walking or climbing stairs? - N  Dressing or bathing? - N  Doing errands, shopping? N -  Some recent data might be hidden    Fall/Depression Screening: No flowsheet data found. PHQ 2/9 Scores 02/22/2020  PHQ - 2 Score 0    Assessment: Received Aetna Medicare [facility] Liaison transition of care referral (patient post hospitalization, primary MD does not completed transition of care follow up) on 02/12/2020.   Referral source: Natividad Brood.   Referral reason: Please assign to [place] Coordinator for complex care and disease management follow up calls and assess for further needs.    Transition of care follow up / screening completed.  Will follow up to assess further care management needs.    Goals Addressed   None      Plan: RNCM will send patient successful outreach letter, [facility] pamphlet, Advanced Directives packet, and magnet. Newly assigned RNCM will call patient for telephone outreach attempt, within 21 business days, follow up on program enrollment, and will proceed with case closure, after 4th unsuccessful outreach call.    Stan Cantave H. Annia Friendly, BSN, [place] Management [facility] Telephonic CM Phone: [phone] Fax: [phone]

## 2020-02-23 ENCOUNTER — Ambulatory Visit (INDEPENDENT_AMBULATORY_CARE_PROVIDER_SITE_OTHER): Payer: Medicare HMO | Admitting: Pharmacist

## 2020-02-23 ENCOUNTER — Other Ambulatory Visit: Payer: Self-pay

## 2020-02-23 DIAGNOSIS — Z5181 Encounter for therapeutic drug level monitoring: Secondary | ICD-10-CM

## 2020-02-23 DIAGNOSIS — Z954 Presence of other heart-valve replacement: Secondary | ICD-10-CM

## 2020-02-23 DIAGNOSIS — Z952 Presence of prosthetic heart valve: Secondary | ICD-10-CM | POA: Diagnosis not present

## 2020-02-23 LAB — POCT INR: INR: 1.9 — AB (ref 2.0–3.0)

## 2020-02-23 NOTE — Patient Instructions (Signed)
Description   Take 4 tablets today, then start taking Warfarin 3 tablets daily except for 2 tablets on Mondays and Fridays. Recheck INR in 1 week. Call Coumadin clinic with any problems [phone].

## 2020-03-02 NOTE — Progress Notes (Signed)
Cardiology Office Note   Date:  03/03/2020   ID:  [name], DOB 23-Feb-1961, MRN [id number]  PCP:  Benito Mccreedy, MD  Cardiologist:   Dorris Carnes, MD   F/U of valvular heart disease   History of Present Illness: [name] is a 59 y.o. female with a history of valvular heart dz with aortic stenosis and mitral stenosis. In 2020 she underwent cardiac catheterization that showed no significant CAD. Moderate pulmonary hypertension. Severe mitral stenosis. Moderate aortic stenosis. In July she underwent MVR and AVR (Sorin CarboMedics mechanical valve point, 21 mm, in the aortic position; Sorin [employer] 31 mm valve in the mitral position   postop. Complicated by bilateral pleural effusions (s/p L thoracentes)   The patient also has a hx of CVA, HTN, ESRD, Lupus, PUD and anemia SHe was last seen in clinic in Oct 2020 by [facility]   She continues to follow in coumadin clinic.     I saw the pt when she was hospitalized earlier this fall when she was hospitalized with retroperitoneal bleed   She underwent transfusion and then embolizatoin of vessel.  Since d/c from the hospital she is recovering slowly   Breathing is OK  No dizziness  Going to dialysis MWF   No CP   No bleeding       Current Meds  Medication Sig  . amoxicillin (AMOXIL) 500 MG capsule Take 4 capsules (2,000 mg total) by mouth once as needed (1 hour prior to any dental appointment/procedure including cleaning).  Lorin Picket 1 GM 210 MG(Fe) tablet Take 840 mg by mouth 3 (three) times daily.  . bisacodyl (DULCOLAX) 5 MG EC tablet Take 5 mg by mouth daily as needed for moderate constipation.  . calcitRIOL (ROCALTROL) 0.5 MCG capsule Take 4 capsules (2 mcg total) by mouth every Monday, Wednesday, and Friday.  . cinacalcet (SENSIPAR) 30 MG tablet Take 4 tablets (120 mg total) by mouth every Monday, Wednesday, and Friday with hemodialysis.  . Methoxy PEG-Epoetin Beta (MIRCERA IJ) Done at dialysis  . metoprolol  tartrate (LOPRESSOR) 25 MG tablet Take 1 tablet (25 mg total) by mouth 2 (two) times daily.  . multivitamin (RENA-VIT) TABS tablet Take 1 tablet by mouth at bedtime.   . ondansetron (ZOFRAN) 4 MG tablet Take 4 mg by mouth every 6 (six) hours as needed for nausea or vomiting.  . pantoprazole (PROTONIX) 40 MG tablet Take 1 tablet (40 mg total) by mouth daily.  [name] warfarin (COUMADIN) 3 MG tablet Take 3 tablets daily except 2 tablets on Mondays and Fridays by mouth or as directed by Coumadin Clinic. (Patient taking differently: Take 6-9 mg by mouth See admin instructions. Take 3 tablets (=63m) daily except 2 tablets (= 6 mg) every Monday by mouth or as directed by Coumadin Clinic.)     Allergies:   Patient has no known allergies.   Past Medical History:  Diagnosis Date  . Anemia   . Arthritis    "joints" (10/23/2017)  . [facility] (10/23/2017)  . Chronic diastolic CHF (congestive heart failure) [place]   . Claustrophobia   . Dysfunctional uterine bleeding 12/19/2010  . ESRD (end stage renal disease) on dialysis ([facility]    "MWF; HRicharda Blade" (10/23/2017)  . GERD (gastroesophageal reflux disease)   . Headache   . Hemodialysis patient ([facility]    right extremity port  . History of hiatal hernia   . Hx of cardiovascular stress test  Lexiscan Myoview 4/16:  Normal stress nuclear study, EF 59%  . Hypertension   . Lupus ([place])    "? kind" (10/23/2017)  . Mitral stenosis    Echo 4/16:  EF 55-60%, no RWMA, Gr 1 DD, mod MS (mean 9 mmHg), mod LAE, mild RAE, PASP 65, mod to severe TR, trivial eff // Echo 6/19:  Mild LVH, EF 55-60, no RWMA, Gr 2 DD, mild to mod AS (Mean 20), severe MS (mean 17), massive LAE, PASP 48, trivial effusion   . Peptic ulcer disease   . Pneumonia 10/21/2017  . S/P aortic valve replacement with metallic valve 01/19/5175   21 mm Sorin Carbomedics bileaflet mechanical valve  . S/P mitral valve replacement with metallic valve 1/60/7371   31 mm Sorin [employer]omedics  Optiform bileaflet mechanical valve  . Stroke [facility])    per patient "they said i had a small stroke but i couldnt even tell"  . Valvular heart disease (Aortic Stenosis and Mitral Stenosis) 09/25/2016   s/p mechanical AVR and MVR 11/2018 // Echo 02/2019: EF 65-70, mild LVH, mild LAE, normally functioning mechanical mitral valve prosthesis with no regurgitation or stenosis, trivial TR, normally functioning mechanical aortic valve prosthesis without regurgitation or stenosis     Past Surgical History:  Procedure Laterality Date  . AORTIC VALVE REPLACEMENT  12/02/2018   AORTIC VALVE REPLACEMENT (AVR) USING CARBOMEDICS SUPRA-ANNULAR TOP HAT SIZE 21MM (N/A)  . AORTIC VALVE REPLACEMENT N/A 12/02/2018   Procedure: AORTIC VALVE REPLACEMENT (AVR) USING CARBOMEDICS SUPRA-ANNULAR TOP HAT SIZE 21MM;  Surgeon: Rexene Alberts, MD;  Location: [place];  Service: Open Heart Surgery;  Laterality: N/A;  . AV FISTULA PLACEMENT Right 02/25/2018   Procedure: INSERTION OF 81m x 16cm ARTEGRAFT;  Surgeon: CWaynetta Sandy MD;  [place]  Service: Vascular;  Laterality: Right;  . COLONOSCOPY W/ BIOPSIES AND POLYPECTOMY    . DIALYSIS FISTULA CREATION  2007  . ENDOMETRIAL ABLATION    . ESOPHAGOGASTRODUODENOSCOPY N/A 07/31/2014   Procedure: ESOPHAGOGASTRODUODENOSCOPY (EGD);  Surgeon: MClarene Essex MD;  [facility]  Service: Endoscopy;  Laterality: N/A;  . FISTULOGRAM Right 02/25/2018   Procedure: FISTULOGRAM ARM;  Surgeon: CWaynetta Sandy MD;  [place]  Service: Vascular;  Laterality: Right;  . HEMATOMA EVACUATION Right 03/06/2018   Procedure: EVACUATION HEMATOMA RIGHT UPPER ARM;  Surgeon: CWaynetta Sandy MD;  [place]  Service: Vascular;  Laterality: Right;  . INSERTION OF ARTERIOVENOUS (AV) ARTEGRAFT ARM Right 09/26/2017   Procedure: INSERTION OF ARTERIOVENOUS (AV) ARTEGRAFT INTO RIGHT ARM;  Surgeon: CWaynetta Sandy MD;  [place]  Service:  Vascular;  Laterality: Right;  . INSERTION OF DIALYSIS CATHETER Right 03/06/2018   Procedure: INSERTION OF DIALYSIS CATHETER, right internal jugular;  Surgeon: CWaynetta Sandy MD;  [place]  Service: Vascular;  Laterality: Right;  . IR EMBO ART  VEN [place]R [place] 01/27/2020  . IR RENAL SELECTIVE  UNI INC S&I MOD SED  01/27/2020  . IR THORACENTESIS ASP PLEURAL SPACE W/IMG GUIDE  01/07/2019  . IR [place]ASC ACCESS RIGHT  01/27/2020  . MITRAL VALVE REPLACEMENT N/A 12/02/2018   Procedure: MITRAL VALVE (MV) REPLACEMENT USING CARBOMEDICS OPTIFORM SIZE 31MM;  Surgeon: ORexene Alberts MD;  [place]  Service: Open Heart Surgery;  Laterality: N/A;  . REVISON OF ARTERIOVENOUS FISTULA Right 09/26/2017   Procedure: REVISION OF ARTERIOVENOUS FISTULA RIGHT ARM WITH ARTEGRAFT;  Surgeon: CWaynetta Sandy MD;  [facility];  Service: Vascular;  Laterality: Right;  . REVISON OF ARTERIOVENOUS FISTULA Right 02/25/2018   Procedure: REVISION OF ARTERIOVENOUS FISTULA;  Surgeon: Waynetta Sandy, MD;  Location: [place];  Service: Vascular;  Laterality: Right;  . RIGHT/LEFT HEART CATH AND CORONARY ANGIOGRAPHY N/A 07/24/2018   Procedure: RIGHT/LEFT HEART CATH AND CORONARY ANGIOGRAPHY;  Surgeon: Larey Dresser, MD;  Location: [place] CV LAB;  Service: Cardiovascular;  Laterality: N/A;  . SHOULDER OPEN ROTATOR CUFF REPAIR Right 10/09/2016   Procedure: ROTATOR CUFF REPAIR SHOULDER OPEN partial acrominectomy and extensive synovectomy;  Surgeon: Latanya [name], MD;  [facility];  Service: Orthopedics;  Laterality: Right;  RNFA  . TEE WITHOUT CARDIOVERSION N/A 01/02/2018   Procedure: TRANSESOPHAGEAL ECHOCARDIOGRAM (TEE) Bubble Study;  Surgeon: Larey Dresser, MD;  Location[facility];  Service: Cardiovascular;  Laterality: N/A;  . TEE WITHOUT CARDIOVERSION N/A 12/02/2018   Procedure: TRANSESOPHAGEAL ECHOCARDIOGRAM (TEE);  Surgeon: Rexene Alberts, MD;  Location: [place];  Service: Open Heart Surgery;  Laterality: N/A;  . TUBAL LIGATION       Social History:  The patient  reports that she quit smoking about 15 months ago. Her smoking use included cigarettes. She started smoking about 2 years ago. She has a 1.00 pack-year smoking history. She has never used smokeless tobacco. She reports current alcohol use of about 1.0 standard drink of alcohol per week. She reports previous drug use. Drugs: Marijuana and Cocaine.   Family History:  The patient's family history includes Breast cancer in her mother; Hypertension in her father and mother; Liver cancer in her maternal grandmother; Lymphoma in her maternal aunt; Renal Disease in her father.    ROS:  Please see the history of present illness. All other systems are reviewed and  Negative to the above problem except as noted.    PHYSICAL EXAM: VS:  BP 124/78   Pulse 83   Ht _0  (1.676 m)   Wt 134 lb 6.4 oz (61 kg)   LMP 12/05/2010 (LMP Unknown)   SpO2 98%   BMI 21.69 kg/m   GEN   Thin 59 yo  in no acute distress  HEENT: normal  Neck: no JVD,  Cardiac: RRR; crisp valve sounds. No LE edema  Respiratory:  clear to auscultation bilaterally,  GI: soft, nontender, nondistended, + BS  No hepatomegaly  MS: no deformity Moving all extremities   Skin: warm and dry, no rash Neuro:  Strength and sensation are intact Psych: euthymic mood, full affect   EKG:  EKG is not ordered today.  Cardiac TESTING Carotid US 11/27/2018 Bilateral ICA 1-39  R/L Cardiac catheterization 07/24/2018 Conclusion: 1. No significant CAD.  2. Mildly elevated PCWP.  3. Preserved cardiac output.  4. 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  5. Severe mitral stenosis.  6. Moderate aortic stenosis.   LAD irregs LCx irregs RCA irregs RA mean 4 RV 74/3 PA 64/21, mean 42 PCWP mean 19 LV 169/10 AO 158/73  Oxygen saturations: PA 67% AO 99%  Cardiac Output (Fick) 6.15  Cardiac  Index (Fick) 3.64 PVR 3.7 WU  Cardiac Output (Thermo) 7.15 Cardiac Index (Thermo) 4.23  PVR 3.2 WU  Mitral valve:  Mean gradient 22 mmHg MVA (Fick) 1.38 cm^2 MVA (Thermo) 1.6 cm^2  Aortic valve:  Mean gradient 10 mmHg, AVA 1.73 cm^2 (simultaneous FA/LV) Mean gradient 24 mmHg, AVA 1.41 cm^2 (pull back)    Echocardiogram 07/03/2018 EF 60-65, mild LVH, pseudonormalization (Gr 2 DD), normal RVSF,  mod to severe MAC, mild to mod MR, severe MS (mean 21), mod AS (mean 34), severe LAE, PASP 34, trivial eff  TEE 01/02/18 Mild LVH, EF 55-60, no RWMA, functionally bicuspid AoV, mod AS (mean 26), mild to mod MR, mod MS (mean 16), mild LAE, mildly reduced RVSF    Lipid Panel    Component Value Date/Time   CHOL 187 02/23/2016 1040   TRIG 89 02/23/2016 1040   HDL 55 02/23/2016 1040   CHOLHDL 3.4 02/23/2016 1040   VLDL 18 02/23/2016 1040   LDLCALC 114 02/23/2016 1040      Wt Readings from Last 3 Encounters:  03/03/20 134 lb 6.4 oz (61 kg)  02/12/20 134 lb 7.7 oz (61 kg)  09/18/19 137 lb 12.8 oz (62.5 kg)      ASSESSMENT AND PLAN:  1. Valvular heart disease. Patient status post AVR, MVR in July 2020. Keep on coumadin  Due for INR today   2  Bleeding   No obvious recurence    3. Hypertension blood pressure remains adequately controlled.  3. End-stage renal disease -Continue dialysis.   Will set to see pt in the spring     Current medicines are reviewed at length with the patient today.  The patient does not have concerns regarding medicines.  Signed, Dorris Carnes, MD  03/03/2020 10:44 PM    [address], [place], [place]  [address] Phone: [phone]; Fax: [phone]

## 2020-03-03 ENCOUNTER — Other Ambulatory Visit: Payer: Self-pay

## 2020-03-03 ENCOUNTER — Encounter: Payer: Self-pay | Admitting: Internal Medicine

## 2020-03-03 ENCOUNTER — Ambulatory Visit (INDEPENDENT_AMBULATORY_CARE_PROVIDER_SITE_OTHER): Payer: Medicare HMO | Admitting: Pharmacist

## 2020-03-03 ENCOUNTER — Ambulatory Visit (INDEPENDENT_AMBULATORY_CARE_PROVIDER_SITE_OTHER): Payer: Medicare HMO | Admitting: Internal Medicine

## 2020-03-03 VITALS — BP 124/78 | HR 83 | Ht 66.0 in | Wt 134.4 lb

## 2020-03-03 DIAGNOSIS — Z5181 Encounter for therapeutic drug level monitoring: Secondary | ICD-10-CM

## 2020-03-03 DIAGNOSIS — I359 Nonrheumatic aortic valve disorder, unspecified: Secondary | ICD-10-CM

## 2020-03-03 DIAGNOSIS — I059 Rheumatic mitral valve disease, unspecified: Secondary | ICD-10-CM

## 2020-03-03 DIAGNOSIS — Z954 Presence of other heart-valve replacement: Secondary | ICD-10-CM

## 2020-03-03 DIAGNOSIS — Z952 Presence of prosthetic heart valve: Secondary | ICD-10-CM

## 2020-03-03 LAB — POCT INR: INR: 2.2 (ref 2.0–3.0)

## 2020-03-03 NOTE — Patient Instructions (Signed)
Take 4 tablets today, then start taking Warfarin 3 tablets daily except for 2 tablets on Mondays. Recheck INR in 1 week. Call Coumadin clinic with any problems [phone].

## 2020-03-03 NOTE — Patient Instructions (Signed)
Medication Instructions:  No changes *If you need a refill on your cardiac medications before your next appointment, please call your pharmacy*   Lab Work: none If you have labs (blood work) drawn today and your tests are completely normal, you will receive your results only by: [name] MyChart Message (if you have MyChart) OR . A paper copy in the mail If you have any lab test that is abnormal or we need to change your treatment, we will call you to review the results.   Testing/Procedures: none   Follow-Up: At [facility], you and your health needs are our priority.  As part of our continuing mission to provide you with exceptional heart care, we have created designated Provider Care Teams.  These Care Teams include your primary Cardiologist (physician) and Advanced Practice Providers (APPs -  Physician Assistants and Nurse Practitioners) who all work together to provide you with the care you need, when you need it.  We recommend signing up for the patient portal called "MyChart".  Sign up information is provided on this After Visit Summary.  MyChart is used to connect with patients for Virtual Visits (Telemedicine).  Patients are able to view lab/test results, encounter notes, upcoming appointments, etc.  Non-urgent messages can be sent to your provider as well.   To learn more about what you can do with MyChart, go to [email or web address].    Your next appointment:   6 month(s)  The format for your next appointment:   In Person  Provider:   You may see Dorris Carnes, MD or one of the following Advanced Practice Providers on your designated Care Team:    Richardson Dopp, PA-C  Robbie Lis, [place]    Other Instructions

## 2020-03-10 ENCOUNTER — Other Ambulatory Visit: Payer: Self-pay

## 2020-03-10 ENCOUNTER — Ambulatory Visit (INDEPENDENT_AMBULATORY_CARE_PROVIDER_SITE_OTHER): Payer: Medicare HMO

## 2020-03-10 DIAGNOSIS — Z952 Presence of prosthetic heart valve: Secondary | ICD-10-CM

## 2020-03-10 DIAGNOSIS — Z954 Presence of other heart-valve replacement: Secondary | ICD-10-CM

## 2020-03-10 DIAGNOSIS — Z5181 Encounter for therapeutic drug level monitoring: Secondary | ICD-10-CM

## 2020-03-10 LAB — POCT INR: INR: 1.8 — AB (ref 2.0–3.0)

## 2020-03-10 NOTE — Patient Instructions (Signed)
Description   Take 4 tablets today, then start taking Warfarin 3 tablets daily.  Recheck INR in 2 weeks. Call Coumadin clinic with any problems [phone].

## 2020-03-14 ENCOUNTER — Ambulatory Visit: Payer: Self-pay | Admitting: *Deleted

## 2020-03-24 ENCOUNTER — Other Ambulatory Visit: Payer: Self-pay

## 2020-03-24 ENCOUNTER — Other Ambulatory Visit: Payer: Self-pay | Admitting: *Deleted

## 2020-03-24 ENCOUNTER — Ambulatory Visit (INDEPENDENT_AMBULATORY_CARE_PROVIDER_SITE_OTHER): Payer: Medicare HMO | Admitting: *Deleted

## 2020-03-24 DIAGNOSIS — Z954 Presence of other heart-valve replacement: Secondary | ICD-10-CM | POA: Diagnosis not present

## 2020-03-24 DIAGNOSIS — Z5181 Encounter for therapeutic drug level monitoring: Secondary | ICD-10-CM

## 2020-03-24 LAB — POCT INR: INR: 4 — AB (ref 2.0–3.0)

## 2020-03-24 NOTE — Patient Outreach (Signed)
[facility]) Care Management  03/24/2020  Carolynne Schuchard [birth year] [id number]   Member reassigned to this care manager.  Call placed for assessment of needs, no answer.  HIPAA compliant voice message left.  Will send unsuccessful outreach letter and follow up within the next 3-4 business days.  Valente David, [place], MSN [place] [phone]

## 2020-03-24 NOTE — Patient Instructions (Signed)
Description    Hold warfarin today and then continue to take Warfarin 3 tablets daily.  Recheck INR in 2 weeks. Call Coumadin clinic with any problems [phone].

## 2020-03-29 ENCOUNTER — Other Ambulatory Visit: Payer: Self-pay | Admitting: *Deleted

## 2020-03-29 NOTE — Patient Outreach (Signed)
[facility]) Care Management  03/29/2020  [name] 1960-06-01 [id number]   Outreach attempt #2, successful.  Identity verified.  This care manager introduced self and stated purpose of call.  Per chart, during last outreach by previously assigned RNCM, member declined ongoing involvement with [facility].  Benefits of program explained, engagement discussed.  She report she is doing well, denies any concerns or need for involvement.  She does agree to receiving information from this care manager, encouraged to contact if needs change in the future.  Will send successful outreach letter, will close case at this time.  Valente David, [place], MSN [place] [phone]

## 2020-04-07 ENCOUNTER — Other Ambulatory Visit: Payer: Self-pay

## 2020-04-07 ENCOUNTER — Ambulatory Visit (INDEPENDENT_AMBULATORY_CARE_PROVIDER_SITE_OTHER): Payer: Medicare HMO | Admitting: *Deleted

## 2020-04-07 DIAGNOSIS — Z5181 Encounter for therapeutic drug level monitoring: Secondary | ICD-10-CM

## 2020-04-07 DIAGNOSIS — Z954 Presence of other heart-valve replacement: Secondary | ICD-10-CM | POA: Diagnosis not present

## 2020-04-07 LAB — POCT INR: INR: 2.8 (ref 2.0–3.0)

## 2020-04-07 MED ORDER — WARFARIN SODIUM 3 MG PO TABS: ORAL_TABLET | ORAL | 0 refills | Status: DC

## 2020-04-07 NOTE — Patient Instructions (Signed)
Description   Continue to take Warfarin 3 tablets daily.  Recheck INR in  3 weeks. Call Coumadin clinic with any problems [phone].

## 2020-04-20 DIAGNOSIS — R52 Pain, unspecified: Secondary | ICD-10-CM | POA: Diagnosis not present

## 2020-04-20 DIAGNOSIS — M3210 Systemic lupus erythematosus, organ or system involvement unspecified: Secondary | ICD-10-CM | POA: Diagnosis not present

## 2020-04-20 DIAGNOSIS — Z992 Dependence on renal dialysis: Secondary | ICD-10-CM | POA: Diagnosis not present

## 2020-04-20 DIAGNOSIS — N2581 Secondary hyperparathyroidism of renal origin: Secondary | ICD-10-CM | POA: Diagnosis not present

## 2020-04-20 DIAGNOSIS — D631 Anemia in chronic kidney disease: Secondary | ICD-10-CM | POA: Diagnosis not present

## 2020-04-20 DIAGNOSIS — N186 End stage renal disease: Secondary | ICD-10-CM | POA: Diagnosis not present

## 2020-04-20 DIAGNOSIS — R519 Headache, unspecified: Secondary | ICD-10-CM | POA: Diagnosis not present

## 2020-04-22 DIAGNOSIS — Z992 Dependence on renal dialysis: Secondary | ICD-10-CM | POA: Diagnosis not present

## 2020-04-22 DIAGNOSIS — R519 Headache, unspecified: Secondary | ICD-10-CM | POA: Diagnosis not present

## 2020-04-22 DIAGNOSIS — M3210 Systemic lupus erythematosus, organ or system involvement unspecified: Secondary | ICD-10-CM | POA: Diagnosis not present

## 2020-04-22 DIAGNOSIS — D631 Anemia in chronic kidney disease: Secondary | ICD-10-CM | POA: Diagnosis not present

## 2020-04-22 DIAGNOSIS — N2581 Secondary hyperparathyroidism of renal origin: Secondary | ICD-10-CM | POA: Diagnosis not present

## 2020-04-22 DIAGNOSIS — R52 Pain, unspecified: Secondary | ICD-10-CM | POA: Diagnosis not present

## 2020-04-22 DIAGNOSIS — N186 End stage renal disease: Secondary | ICD-10-CM | POA: Diagnosis not present

## 2020-04-25 DIAGNOSIS — D631 Anemia in chronic kidney disease: Secondary | ICD-10-CM | POA: Diagnosis not present

## 2020-04-25 DIAGNOSIS — R519 Headache, unspecified: Secondary | ICD-10-CM | POA: Diagnosis not present

## 2020-04-25 DIAGNOSIS — N2581 Secondary hyperparathyroidism of renal origin: Secondary | ICD-10-CM | POA: Diagnosis not present

## 2020-04-25 DIAGNOSIS — R52 Pain, unspecified: Secondary | ICD-10-CM | POA: Diagnosis not present

## 2020-04-25 DIAGNOSIS — Z992 Dependence on renal dialysis: Secondary | ICD-10-CM | POA: Diagnosis not present

## 2020-04-25 DIAGNOSIS — N186 End stage renal disease: Secondary | ICD-10-CM | POA: Diagnosis not present

## 2020-04-25 DIAGNOSIS — M3210 Systemic lupus erythematosus, organ or system involvement unspecified: Secondary | ICD-10-CM | POA: Diagnosis not present

## 2020-04-27 DIAGNOSIS — N2581 Secondary hyperparathyroidism of renal origin: Secondary | ICD-10-CM | POA: Diagnosis not present

## 2020-04-27 DIAGNOSIS — R519 Headache, unspecified: Secondary | ICD-10-CM | POA: Diagnosis not present

## 2020-04-27 DIAGNOSIS — Z992 Dependence on renal dialysis: Secondary | ICD-10-CM | POA: Diagnosis not present

## 2020-04-27 DIAGNOSIS — M3210 Systemic lupus erythematosus, organ or system involvement unspecified: Secondary | ICD-10-CM | POA: Diagnosis not present

## 2020-04-27 DIAGNOSIS — D631 Anemia in chronic kidney disease: Secondary | ICD-10-CM | POA: Diagnosis not present

## 2020-04-27 DIAGNOSIS — N186 End stage renal disease: Secondary | ICD-10-CM | POA: Diagnosis not present

## 2020-04-27 DIAGNOSIS — R52 Pain, unspecified: Secondary | ICD-10-CM | POA: Diagnosis not present

## 2020-04-28 ENCOUNTER — Other Ambulatory Visit: Payer: Self-pay

## 2020-04-28 ENCOUNTER — Ambulatory Visit (INDEPENDENT_AMBULATORY_CARE_PROVIDER_SITE_OTHER): Payer: Medicare Other | Admitting: *Deleted

## 2020-04-28 DIAGNOSIS — Z5181 Encounter for therapeutic drug level monitoring: Secondary | ICD-10-CM | POA: Diagnosis not present

## 2020-04-28 DIAGNOSIS — Z954 Presence of other heart-valve replacement: Secondary | ICD-10-CM | POA: Diagnosis not present

## 2020-04-28 DIAGNOSIS — Z952 Presence of prosthetic heart valve: Secondary | ICD-10-CM | POA: Diagnosis not present

## 2020-04-28 LAB — POCT INR: INR: 2.4 (ref 2.0–3.0)

## 2020-04-28 NOTE — Patient Instructions (Addendum)
  Description   Today take 3.5 tablets then continue to take Warfarin 3 tablets daily.  Recheck INR in  3 weeks. Call Coumadin clinic with any problems [phone].

## 2020-04-29 DIAGNOSIS — N186 End stage renal disease: Secondary | ICD-10-CM | POA: Diagnosis not present

## 2020-04-29 DIAGNOSIS — D631 Anemia in chronic kidney disease: Secondary | ICD-10-CM | POA: Diagnosis not present

## 2020-04-29 DIAGNOSIS — R519 Headache, unspecified: Secondary | ICD-10-CM | POA: Diagnosis not present

## 2020-04-29 DIAGNOSIS — N2581 Secondary hyperparathyroidism of renal origin: Secondary | ICD-10-CM | POA: Diagnosis not present

## 2020-04-29 DIAGNOSIS — R52 Pain, unspecified: Secondary | ICD-10-CM | POA: Diagnosis not present

## 2020-04-29 DIAGNOSIS — Z992 Dependence on renal dialysis: Secondary | ICD-10-CM | POA: Diagnosis not present

## 2020-04-29 DIAGNOSIS — M3210 Systemic lupus erythematosus, organ or system involvement unspecified: Secondary | ICD-10-CM | POA: Diagnosis not present

## 2020-05-02 DIAGNOSIS — R519 Headache, unspecified: Secondary | ICD-10-CM | POA: Diagnosis not present

## 2020-05-02 DIAGNOSIS — M3210 Systemic lupus erythematosus, organ or system involvement unspecified: Secondary | ICD-10-CM | POA: Diagnosis not present

## 2020-05-02 DIAGNOSIS — R52 Pain, unspecified: Secondary | ICD-10-CM | POA: Diagnosis not present

## 2020-05-02 DIAGNOSIS — N186 End stage renal disease: Secondary | ICD-10-CM | POA: Diagnosis not present

## 2020-05-02 DIAGNOSIS — N2581 Secondary hyperparathyroidism of renal origin: Secondary | ICD-10-CM | POA: Diagnosis not present

## 2020-05-02 DIAGNOSIS — D631 Anemia in chronic kidney disease: Secondary | ICD-10-CM | POA: Diagnosis not present

## 2020-05-02 DIAGNOSIS — Z992 Dependence on renal dialysis: Secondary | ICD-10-CM | POA: Diagnosis not present

## 2020-05-04 DIAGNOSIS — N186 End stage renal disease: Secondary | ICD-10-CM | POA: Diagnosis not present

## 2020-05-04 DIAGNOSIS — N2581 Secondary hyperparathyroidism of renal origin: Secondary | ICD-10-CM | POA: Diagnosis not present

## 2020-05-04 DIAGNOSIS — R52 Pain, unspecified: Secondary | ICD-10-CM | POA: Diagnosis not present

## 2020-05-04 DIAGNOSIS — M3210 Systemic lupus erythematosus, organ or system involvement unspecified: Secondary | ICD-10-CM | POA: Diagnosis not present

## 2020-05-04 DIAGNOSIS — Z992 Dependence on renal dialysis: Secondary | ICD-10-CM | POA: Diagnosis not present

## 2020-05-04 DIAGNOSIS — D631 Anemia in chronic kidney disease: Secondary | ICD-10-CM | POA: Diagnosis not present

## 2020-05-04 DIAGNOSIS — R519 Headache, unspecified: Secondary | ICD-10-CM | POA: Diagnosis not present

## 2020-05-06 DIAGNOSIS — R52 Pain, unspecified: Secondary | ICD-10-CM | POA: Diagnosis not present

## 2020-05-06 DIAGNOSIS — N186 End stage renal disease: Secondary | ICD-10-CM | POA: Diagnosis not present

## 2020-05-06 DIAGNOSIS — R519 Headache, unspecified: Secondary | ICD-10-CM | POA: Diagnosis not present

## 2020-05-06 DIAGNOSIS — D631 Anemia in chronic kidney disease: Secondary | ICD-10-CM | POA: Diagnosis not present

## 2020-05-06 DIAGNOSIS — M3210 Systemic lupus erythematosus, organ or system involvement unspecified: Secondary | ICD-10-CM | POA: Diagnosis not present

## 2020-05-06 DIAGNOSIS — Z992 Dependence on renal dialysis: Secondary | ICD-10-CM | POA: Diagnosis not present

## 2020-05-06 DIAGNOSIS — N2581 Secondary hyperparathyroidism of renal origin: Secondary | ICD-10-CM | POA: Diagnosis not present

## 2020-05-07 DIAGNOSIS — J9811 Atelectasis: Secondary | ICD-10-CM | POA: Diagnosis not present

## 2020-05-07 DIAGNOSIS — I050 Rheumatic mitral stenosis: Secondary | ICD-10-CM | POA: Diagnosis not present

## 2020-05-07 DIAGNOSIS — I350 Nonrheumatic aortic (valve) stenosis: Secondary | ICD-10-CM | POA: Diagnosis not present

## 2020-05-09 DIAGNOSIS — R52 Pain, unspecified: Secondary | ICD-10-CM | POA: Diagnosis not present

## 2020-05-09 DIAGNOSIS — N186 End stage renal disease: Secondary | ICD-10-CM | POA: Diagnosis not present

## 2020-05-09 DIAGNOSIS — Z992 Dependence on renal dialysis: Secondary | ICD-10-CM | POA: Diagnosis not present

## 2020-05-09 DIAGNOSIS — N2581 Secondary hyperparathyroidism of renal origin: Secondary | ICD-10-CM | POA: Diagnosis not present

## 2020-05-09 DIAGNOSIS — D631 Anemia in chronic kidney disease: Secondary | ICD-10-CM | POA: Diagnosis not present

## 2020-05-09 DIAGNOSIS — R519 Headache, unspecified: Secondary | ICD-10-CM | POA: Diagnosis not present

## 2020-05-09 DIAGNOSIS — M3210 Systemic lupus erythematosus, organ or system involvement unspecified: Secondary | ICD-10-CM | POA: Diagnosis not present

## 2020-05-11 DIAGNOSIS — Z992 Dependence on renal dialysis: Secondary | ICD-10-CM | POA: Diagnosis not present

## 2020-05-11 DIAGNOSIS — M3210 Systemic lupus erythematosus, organ or system involvement unspecified: Secondary | ICD-10-CM | POA: Diagnosis not present

## 2020-05-11 DIAGNOSIS — N2581 Secondary hyperparathyroidism of renal origin: Secondary | ICD-10-CM | POA: Diagnosis not present

## 2020-05-11 DIAGNOSIS — R52 Pain, unspecified: Secondary | ICD-10-CM | POA: Diagnosis not present

## 2020-05-11 DIAGNOSIS — N186 End stage renal disease: Secondary | ICD-10-CM | POA: Diagnosis not present

## 2020-05-11 DIAGNOSIS — R519 Headache, unspecified: Secondary | ICD-10-CM | POA: Diagnosis not present

## 2020-05-11 DIAGNOSIS — D631 Anemia in chronic kidney disease: Secondary | ICD-10-CM | POA: Diagnosis not present

## 2020-05-13 DIAGNOSIS — N2581 Secondary hyperparathyroidism of renal origin: Secondary | ICD-10-CM | POA: Diagnosis not present

## 2020-05-13 DIAGNOSIS — R52 Pain, unspecified: Secondary | ICD-10-CM | POA: Diagnosis not present

## 2020-05-13 DIAGNOSIS — M3210 Systemic lupus erythematosus, organ or system involvement unspecified: Secondary | ICD-10-CM | POA: Diagnosis not present

## 2020-05-13 DIAGNOSIS — D631 Anemia in chronic kidney disease: Secondary | ICD-10-CM | POA: Diagnosis not present

## 2020-05-13 DIAGNOSIS — Z992 Dependence on renal dialysis: Secondary | ICD-10-CM | POA: Diagnosis not present

## 2020-05-13 DIAGNOSIS — R519 Headache, unspecified: Secondary | ICD-10-CM | POA: Diagnosis not present

## 2020-05-13 DIAGNOSIS — N186 End stage renal disease: Secondary | ICD-10-CM | POA: Diagnosis not present

## 2020-05-16 ENCOUNTER — Other Ambulatory Visit: Payer: Self-pay | Admitting: Obstetrics

## 2020-05-16 DIAGNOSIS — N6320 Unspecified lump in the left breast, unspecified quadrant: Secondary | ICD-10-CM

## 2020-05-16 DIAGNOSIS — D631 Anemia in chronic kidney disease: Secondary | ICD-10-CM | POA: Diagnosis not present

## 2020-05-16 DIAGNOSIS — R52 Pain, unspecified: Secondary | ICD-10-CM | POA: Diagnosis not present

## 2020-05-16 DIAGNOSIS — M3210 Systemic lupus erythematosus, organ or system involvement unspecified: Secondary | ICD-10-CM | POA: Diagnosis not present

## 2020-05-16 DIAGNOSIS — Z992 Dependence on renal dialysis: Secondary | ICD-10-CM | POA: Diagnosis not present

## 2020-05-16 DIAGNOSIS — N186 End stage renal disease: Secondary | ICD-10-CM | POA: Diagnosis not present

## 2020-05-16 DIAGNOSIS — N2581 Secondary hyperparathyroidism of renal origin: Secondary | ICD-10-CM | POA: Diagnosis not present

## 2020-05-16 DIAGNOSIS — R519 Headache, unspecified: Secondary | ICD-10-CM | POA: Diagnosis not present

## 2020-05-17 ENCOUNTER — Telehealth: Payer: Self-pay | Admitting: Pharmacist

## 2020-05-17 ENCOUNTER — Telehealth: Payer: Self-pay | Admitting: *Deleted

## 2020-05-17 NOTE — Telephone Encounter (Signed)
Dr. Posey Pronto DDS [phone])   Assistance calling from dentist office. Need to know if okay to extract 2 teeth today without holding warfarin.   Noted on warfarin for Hx of mechanical valve on warfarin with INR = 2.4 on Dec/9.   Okay to proceed with up to 2 extractions today. No need to hold warfarin.  Office will fax request for surgical clearance for future procedure to remove 9-10 teeth.

## 2020-05-17 NOTE — Telephone Encounter (Signed)
° °  [facility] Pre-operative Risk Assessment    HEARTCARE STAFF: - Please ensure there is not already an duplicate clearance open for this procedure. - Under Visit Info/Reason for Call, type in Other and utilize the format Clearance MM/DD/YY or Clearance TBD. Do not use dashes or single digits. - If request is for dental extraction, please clarify the # of teeth to be extracted.  Request for surgical clearance:  1. What type of surgery is being performed? Surgical extractions of 14 teeth at the same time  2. When is this surgery scheduled? 05/19/2020   3. What type of clearance is required (medical clearance vs. Pharmacy clearance to hold med vs. Both)? Both  4. Are there any medications that need to be held prior to surgery and how long?Warfarin   5. Practice name and name of physician performing surgery? [facility], Dr Randa Spike, DMD  6. What is the office phone number? [phone]   7.   What is the office fax number? [phone]  8.   Anesthesia type (None, local, MAC, general) ? Not listed   Juventino Slovak 05/17/2020, 5:09 PM  _________________________________________________________________   (provider comments below)

## 2020-05-18 MED ORDER — AMOXICILLIN 500 MG PO CAPS: 2000.0000 mg | ORAL_CAPSULE | Freq: Once | ORAL | 4 refills | Status: AC | PRN

## 2020-05-18 NOTE — Telephone Encounter (Signed)
   Primary Cardiologist: Dorris Carnes, MD  Chart reviewed as part of pre-operative protocol coverage.  Given past medical history and time since last visit, based on ACC/AHA guidelines, Mariella Blackwelder would be at acceptable risk for the planned procedure without further cardiovascular testing.   The patient was advised that if she develops new symptoms prior to surgery to contact our office to arrange for a follow-up visit, and she verbalized understanding.  Date of procedure will need to be changed. I spoke with the dental office this morning to make them aware. Per pharmacy review due to history of mechanical valve, CVA, ESRD on HD - patient will need to hold warfarin 5 days prior to 14 extractions and will also require bridging with Lovenox which will need to be coordinated in Coumadin Clinic 1 week prior to procedure.  SBE prophylaxis is required for the patient from a cardiac standpoint. Recommend Amoxicillin 2g 30-5minutes prior to dental work. I have sent renewed prescription as she tells me she is out of refills.   Miss Amorin will call us when new procedure is set so we can coordinate Lovenox bridge.   I will route this recommendation to the requesting party via Epic fax function and remove from pre-op pool.  Please call with questions.  Loel Dubonnet, NP 05/18/2020, 9:45 AM   Called dental office to make them aware of need for 5 day Warfarin hold with Lovenox bridging.   Dentist called 05/18/20 while patient was in office   Send refill

## 2020-05-18 NOTE — Telephone Encounter (Signed)
Patient with diagnosis of mechanical MVR, mechanical AVR, and stroke on warfarin for anticoagulation.    Procedure: 14 dental extracations Date of procedure: 05/19/20  Pt is on dialysis Platelet count 257K  Procedure will need to be rescheduled since pt will need to hold warfarin for 5 days prior to 14 extractions and will also require bridging with Lovenox which will need to be coordinated in Coumadin clinic 1 week prior to procedure. She also requires pre op antibiotics and should take amoxicillin 2g 30-60 minutes prior to her dental work once it's rescheduled, rx is already on file. She should call us when new procedure date is set so that we can coordinate Lovenox bridge.

## 2020-05-19 ENCOUNTER — Ambulatory Visit: Payer: Medicare HMO

## 2020-05-19 ENCOUNTER — Other Ambulatory Visit: Payer: Self-pay

## 2020-05-19 ENCOUNTER — Other Ambulatory Visit: Payer: Medicare HMO

## 2020-05-19 ENCOUNTER — Ambulatory Visit (INDEPENDENT_AMBULATORY_CARE_PROVIDER_SITE_OTHER): Payer: Medicare Other | Admitting: *Deleted

## 2020-05-19 DIAGNOSIS — Z954 Presence of other heart-valve replacement: Secondary | ICD-10-CM

## 2020-05-19 DIAGNOSIS — Z5181 Encounter for therapeutic drug level monitoring: Secondary | ICD-10-CM | POA: Diagnosis not present

## 2020-05-19 DIAGNOSIS — Z952 Presence of prosthetic heart valve: Secondary | ICD-10-CM | POA: Diagnosis not present

## 2020-05-19 LAB — POCT INR: INR: 4.7 — AB (ref 2.0–3.0)

## 2020-05-19 NOTE — Patient Instructions (Signed)
Description   Do not take any Warfarin today and take 1 tablet tomorrow then continue to take Warfarin 3 tablets daily.  Recheck INR in  3 weeks. Call Coumadin clinic with any problems [phone].

## 2020-05-20 DIAGNOSIS — R519 Headache, unspecified: Secondary | ICD-10-CM | POA: Diagnosis not present

## 2020-05-20 DIAGNOSIS — I120 Hypertensive chronic kidney disease with stage 5 chronic kidney disease or end stage renal disease: Secondary | ICD-10-CM | POA: Diagnosis not present

## 2020-05-20 DIAGNOSIS — R52 Pain, unspecified: Secondary | ICD-10-CM | POA: Diagnosis not present

## 2020-05-20 DIAGNOSIS — D631 Anemia in chronic kidney disease: Secondary | ICD-10-CM | POA: Diagnosis not present

## 2020-05-20 DIAGNOSIS — N2581 Secondary hyperparathyroidism of renal origin: Secondary | ICD-10-CM | POA: Diagnosis not present

## 2020-05-20 DIAGNOSIS — M3210 Systemic lupus erythematosus, organ or system involvement unspecified: Secondary | ICD-10-CM | POA: Diagnosis not present

## 2020-05-20 DIAGNOSIS — N186 End stage renal disease: Secondary | ICD-10-CM | POA: Diagnosis not present

## 2020-05-20 DIAGNOSIS — Z992 Dependence on renal dialysis: Secondary | ICD-10-CM | POA: Diagnosis not present

## 2020-05-23 DIAGNOSIS — R509 Fever, unspecified: Secondary | ICD-10-CM | POA: Diagnosis not present

## 2020-05-23 DIAGNOSIS — R52 Pain, unspecified: Secondary | ICD-10-CM | POA: Diagnosis not present

## 2020-05-23 DIAGNOSIS — N186 End stage renal disease: Secondary | ICD-10-CM | POA: Diagnosis not present

## 2020-05-23 DIAGNOSIS — N2581 Secondary hyperparathyroidism of renal origin: Secondary | ICD-10-CM | POA: Diagnosis not present

## 2020-05-23 DIAGNOSIS — M3210 Systemic lupus erythematosus, organ or system involvement unspecified: Secondary | ICD-10-CM | POA: Diagnosis not present

## 2020-05-23 DIAGNOSIS — Z992 Dependence on renal dialysis: Secondary | ICD-10-CM | POA: Diagnosis not present

## 2020-05-23 DIAGNOSIS — R519 Headache, unspecified: Secondary | ICD-10-CM | POA: Diagnosis not present

## 2020-05-24 DIAGNOSIS — M5459 Other low back pain: Secondary | ICD-10-CM | POA: Diagnosis not present

## 2020-05-25 DIAGNOSIS — R509 Fever, unspecified: Secondary | ICD-10-CM | POA: Diagnosis not present

## 2020-05-25 DIAGNOSIS — R519 Headache, unspecified: Secondary | ICD-10-CM | POA: Diagnosis not present

## 2020-05-25 DIAGNOSIS — N2581 Secondary hyperparathyroidism of renal origin: Secondary | ICD-10-CM | POA: Diagnosis not present

## 2020-05-25 DIAGNOSIS — R52 Pain, unspecified: Secondary | ICD-10-CM | POA: Diagnosis not present

## 2020-05-25 DIAGNOSIS — M3210 Systemic lupus erythematosus, organ or system involvement unspecified: Secondary | ICD-10-CM | POA: Diagnosis not present

## 2020-05-25 DIAGNOSIS — N186 End stage renal disease: Secondary | ICD-10-CM | POA: Diagnosis not present

## 2020-05-25 DIAGNOSIS — Z992 Dependence on renal dialysis: Secondary | ICD-10-CM | POA: Diagnosis not present

## 2020-05-27 DIAGNOSIS — R509 Fever, unspecified: Secondary | ICD-10-CM | POA: Diagnosis not present

## 2020-05-27 DIAGNOSIS — N186 End stage renal disease: Secondary | ICD-10-CM | POA: Diagnosis not present

## 2020-05-27 DIAGNOSIS — N2581 Secondary hyperparathyroidism of renal origin: Secondary | ICD-10-CM | POA: Diagnosis not present

## 2020-05-27 DIAGNOSIS — R519 Headache, unspecified: Secondary | ICD-10-CM | POA: Diagnosis not present

## 2020-05-27 DIAGNOSIS — R52 Pain, unspecified: Secondary | ICD-10-CM | POA: Diagnosis not present

## 2020-05-27 DIAGNOSIS — M3210 Systemic lupus erythematosus, organ or system involvement unspecified: Secondary | ICD-10-CM | POA: Diagnosis not present

## 2020-05-27 DIAGNOSIS — Z992 Dependence on renal dialysis: Secondary | ICD-10-CM | POA: Diagnosis not present

## 2020-05-30 DIAGNOSIS — M3210 Systemic lupus erythematosus, organ or system involvement unspecified: Secondary | ICD-10-CM | POA: Diagnosis not present

## 2020-05-30 DIAGNOSIS — Z992 Dependence on renal dialysis: Secondary | ICD-10-CM | POA: Diagnosis not present

## 2020-05-30 DIAGNOSIS — R509 Fever, unspecified: Secondary | ICD-10-CM | POA: Diagnosis not present

## 2020-05-30 DIAGNOSIS — R52 Pain, unspecified: Secondary | ICD-10-CM | POA: Diagnosis not present

## 2020-05-30 DIAGNOSIS — R519 Headache, unspecified: Secondary | ICD-10-CM | POA: Diagnosis not present

## 2020-05-30 DIAGNOSIS — N186 End stage renal disease: Secondary | ICD-10-CM | POA: Diagnosis not present

## 2020-05-30 DIAGNOSIS — N2581 Secondary hyperparathyroidism of renal origin: Secondary | ICD-10-CM | POA: Diagnosis not present

## 2020-06-01 DIAGNOSIS — M3210 Systemic lupus erythematosus, organ or system involvement unspecified: Secondary | ICD-10-CM | POA: Diagnosis not present

## 2020-06-01 DIAGNOSIS — R52 Pain, unspecified: Secondary | ICD-10-CM | POA: Diagnosis not present

## 2020-06-01 DIAGNOSIS — R509 Fever, unspecified: Secondary | ICD-10-CM | POA: Diagnosis not present

## 2020-06-01 DIAGNOSIS — N2581 Secondary hyperparathyroidism of renal origin: Secondary | ICD-10-CM | POA: Diagnosis not present

## 2020-06-01 DIAGNOSIS — Z992 Dependence on renal dialysis: Secondary | ICD-10-CM | POA: Diagnosis not present

## 2020-06-01 DIAGNOSIS — N186 End stage renal disease: Secondary | ICD-10-CM | POA: Diagnosis not present

## 2020-06-01 DIAGNOSIS — R519 Headache, unspecified: Secondary | ICD-10-CM | POA: Diagnosis not present

## 2020-06-02 ENCOUNTER — Other Ambulatory Visit: Payer: Self-pay

## 2020-06-02 ENCOUNTER — Other Ambulatory Visit: Payer: Self-pay | Admitting: Obstetrics

## 2020-06-02 ENCOUNTER — Ambulatory Visit
Admission: RE | Admit: 2020-06-02 | Discharge: 2020-06-02 | Disposition: A | Discharge status: Home / Self Care | Payer: Medicare Other | Source: Ambulatory Visit | Attending: Obstetrics | Admitting: Obstetrics

## 2020-06-02 DIAGNOSIS — R599 Enlarged lymph nodes, unspecified: Secondary | ICD-10-CM

## 2020-06-02 DIAGNOSIS — N6320 Unspecified lump in the left breast, unspecified quadrant: Secondary | ICD-10-CM

## 2020-06-02 DIAGNOSIS — R921 Mammographic calcification found on diagnostic imaging of breast: Secondary | ICD-10-CM | POA: Diagnosis not present

## 2020-06-02 DIAGNOSIS — N641 Fat necrosis of breast: Secondary | ICD-10-CM | POA: Diagnosis not present

## 2020-06-03 DIAGNOSIS — N186 End stage renal disease: Secondary | ICD-10-CM | POA: Diagnosis not present

## 2020-06-03 DIAGNOSIS — M3210 Systemic lupus erythematosus, organ or system involvement unspecified: Secondary | ICD-10-CM | POA: Diagnosis not present

## 2020-06-03 DIAGNOSIS — Z992 Dependence on renal dialysis: Secondary | ICD-10-CM | POA: Diagnosis not present

## 2020-06-03 DIAGNOSIS — R52 Pain, unspecified: Secondary | ICD-10-CM | POA: Diagnosis not present

## 2020-06-03 DIAGNOSIS — N2581 Secondary hyperparathyroidism of renal origin: Secondary | ICD-10-CM | POA: Diagnosis not present

## 2020-06-03 DIAGNOSIS — R519 Headache, unspecified: Secondary | ICD-10-CM | POA: Diagnosis not present

## 2020-06-03 DIAGNOSIS — R509 Fever, unspecified: Secondary | ICD-10-CM | POA: Diagnosis not present

## 2020-06-06 DIAGNOSIS — R509 Fever, unspecified: Secondary | ICD-10-CM | POA: Diagnosis not present

## 2020-06-06 DIAGNOSIS — Z992 Dependence on renal dialysis: Secondary | ICD-10-CM | POA: Diagnosis not present

## 2020-06-06 DIAGNOSIS — N2581 Secondary hyperparathyroidism of renal origin: Secondary | ICD-10-CM | POA: Diagnosis not present

## 2020-06-06 DIAGNOSIS — R52 Pain, unspecified: Secondary | ICD-10-CM | POA: Diagnosis not present

## 2020-06-06 DIAGNOSIS — R519 Headache, unspecified: Secondary | ICD-10-CM | POA: Diagnosis not present

## 2020-06-06 DIAGNOSIS — M3210 Systemic lupus erythematosus, organ or system involvement unspecified: Secondary | ICD-10-CM | POA: Diagnosis not present

## 2020-06-06 DIAGNOSIS — N186 End stage renal disease: Secondary | ICD-10-CM | POA: Diagnosis not present

## 2020-06-07 DIAGNOSIS — I350 Nonrheumatic aortic (valve) stenosis: Secondary | ICD-10-CM | POA: Diagnosis not present

## 2020-06-07 DIAGNOSIS — I050 Rheumatic mitral stenosis: Secondary | ICD-10-CM | POA: Diagnosis not present

## 2020-06-07 DIAGNOSIS — J9811 Atelectasis: Secondary | ICD-10-CM | POA: Diagnosis not present

## 2020-06-08 DIAGNOSIS — R52 Pain, unspecified: Secondary | ICD-10-CM | POA: Diagnosis not present

## 2020-06-08 DIAGNOSIS — R519 Headache, unspecified: Secondary | ICD-10-CM | POA: Diagnosis not present

## 2020-06-08 DIAGNOSIS — N186 End stage renal disease: Secondary | ICD-10-CM | POA: Diagnosis not present

## 2020-06-08 DIAGNOSIS — M3210 Systemic lupus erythematosus, organ or system involvement unspecified: Secondary | ICD-10-CM | POA: Diagnosis not present

## 2020-06-08 DIAGNOSIS — Z992 Dependence on renal dialysis: Secondary | ICD-10-CM | POA: Diagnosis not present

## 2020-06-08 DIAGNOSIS — R509 Fever, unspecified: Secondary | ICD-10-CM | POA: Diagnosis not present

## 2020-06-08 DIAGNOSIS — N2581 Secondary hyperparathyroidism of renal origin: Secondary | ICD-10-CM | POA: Diagnosis not present

## 2020-06-09 ENCOUNTER — Ambulatory Visit (INDEPENDENT_AMBULATORY_CARE_PROVIDER_SITE_OTHER): Payer: Medicare Other | Admitting: *Deleted

## 2020-06-09 ENCOUNTER — Other Ambulatory Visit: Payer: Self-pay

## 2020-06-09 DIAGNOSIS — Z5181 Encounter for therapeutic drug level monitoring: Secondary | ICD-10-CM

## 2020-06-09 DIAGNOSIS — Z954 Presence of other heart-valve replacement: Secondary | ICD-10-CM | POA: Diagnosis not present

## 2020-06-09 LAB — POCT INR: INR: 3.7 — AB (ref 2.0–3.0)

## 2020-06-09 NOTE — Patient Instructions (Signed)
Description   Hold warfarin today and then start taking 3 tablets daily except for 2 tablets on Thursdays. Recheck INR in 3 weeks. Coumadin Clinic [phone].

## 2020-06-10 DIAGNOSIS — R52 Pain, unspecified: Secondary | ICD-10-CM | POA: Diagnosis not present

## 2020-06-10 DIAGNOSIS — Z992 Dependence on renal dialysis: Secondary | ICD-10-CM | POA: Diagnosis not present

## 2020-06-10 DIAGNOSIS — R519 Headache, unspecified: Secondary | ICD-10-CM | POA: Diagnosis not present

## 2020-06-10 DIAGNOSIS — M3210 Systemic lupus erythematosus, organ or system involvement unspecified: Secondary | ICD-10-CM | POA: Diagnosis not present

## 2020-06-10 DIAGNOSIS — R509 Fever, unspecified: Secondary | ICD-10-CM | POA: Diagnosis not present

## 2020-06-10 DIAGNOSIS — N2581 Secondary hyperparathyroidism of renal origin: Secondary | ICD-10-CM | POA: Diagnosis not present

## 2020-06-10 DIAGNOSIS — N186 End stage renal disease: Secondary | ICD-10-CM | POA: Diagnosis not present

## 2020-06-13 DIAGNOSIS — M3210 Systemic lupus erythematosus, organ or system involvement unspecified: Secondary | ICD-10-CM | POA: Diagnosis not present

## 2020-06-13 DIAGNOSIS — R509 Fever, unspecified: Secondary | ICD-10-CM | POA: Diagnosis not present

## 2020-06-13 DIAGNOSIS — R519 Headache, unspecified: Secondary | ICD-10-CM | POA: Diagnosis not present

## 2020-06-13 DIAGNOSIS — R52 Pain, unspecified: Secondary | ICD-10-CM | POA: Diagnosis not present

## 2020-06-13 DIAGNOSIS — N2581 Secondary hyperparathyroidism of renal origin: Secondary | ICD-10-CM | POA: Diagnosis not present

## 2020-06-13 DIAGNOSIS — N186 End stage renal disease: Secondary | ICD-10-CM | POA: Diagnosis not present

## 2020-06-13 DIAGNOSIS — Z992 Dependence on renal dialysis: Secondary | ICD-10-CM | POA: Diagnosis not present

## 2020-06-15 DIAGNOSIS — R52 Pain, unspecified: Secondary | ICD-10-CM | POA: Diagnosis not present

## 2020-06-15 DIAGNOSIS — Z992 Dependence on renal dialysis: Secondary | ICD-10-CM | POA: Diagnosis not present

## 2020-06-15 DIAGNOSIS — N2581 Secondary hyperparathyroidism of renal origin: Secondary | ICD-10-CM | POA: Diagnosis not present

## 2020-06-15 DIAGNOSIS — R519 Headache, unspecified: Secondary | ICD-10-CM | POA: Diagnosis not present

## 2020-06-15 DIAGNOSIS — R509 Fever, unspecified: Secondary | ICD-10-CM | POA: Diagnosis not present

## 2020-06-15 DIAGNOSIS — M3210 Systemic lupus erythematosus, organ or system involvement unspecified: Secondary | ICD-10-CM | POA: Diagnosis not present

## 2020-06-15 DIAGNOSIS — N186 End stage renal disease: Secondary | ICD-10-CM | POA: Diagnosis not present

## 2020-06-16 ENCOUNTER — Other Ambulatory Visit: Payer: Self-pay | Admitting: Obstetrics

## 2020-06-16 ENCOUNTER — Other Ambulatory Visit: Payer: Self-pay

## 2020-06-16 ENCOUNTER — Ambulatory Visit
Admission: RE | Admit: 2020-06-16 | Discharge: 2020-06-16 | Disposition: A | Discharge status: Home / Self Care | Payer: Medicare Other | Source: Ambulatory Visit | Attending: Obstetrics | Admitting: Obstetrics

## 2020-06-16 DIAGNOSIS — N6320 Unspecified lump in the left breast, unspecified quadrant: Secondary | ICD-10-CM | POA: Diagnosis not present

## 2020-06-16 DIAGNOSIS — R599 Enlarged lymph nodes, unspecified: Secondary | ICD-10-CM

## 2020-06-17 DIAGNOSIS — Z992 Dependence on renal dialysis: Secondary | ICD-10-CM | POA: Diagnosis not present

## 2020-06-17 DIAGNOSIS — R52 Pain, unspecified: Secondary | ICD-10-CM | POA: Diagnosis not present

## 2020-06-17 DIAGNOSIS — R509 Fever, unspecified: Secondary | ICD-10-CM | POA: Diagnosis not present

## 2020-06-17 DIAGNOSIS — N186 End stage renal disease: Secondary | ICD-10-CM | POA: Diagnosis not present

## 2020-06-17 DIAGNOSIS — N2581 Secondary hyperparathyroidism of renal origin: Secondary | ICD-10-CM | POA: Diagnosis not present

## 2020-06-17 DIAGNOSIS — R519 Headache, unspecified: Secondary | ICD-10-CM | POA: Diagnosis not present

## 2020-06-17 DIAGNOSIS — M3210 Systemic lupus erythematosus, organ or system involvement unspecified: Secondary | ICD-10-CM | POA: Diagnosis not present

## 2020-06-20 DIAGNOSIS — R509 Fever, unspecified: Secondary | ICD-10-CM | POA: Diagnosis not present

## 2020-06-20 DIAGNOSIS — N2581 Secondary hyperparathyroidism of renal origin: Secondary | ICD-10-CM | POA: Diagnosis not present

## 2020-06-20 DIAGNOSIS — N186 End stage renal disease: Secondary | ICD-10-CM | POA: Diagnosis not present

## 2020-06-20 DIAGNOSIS — R519 Headache, unspecified: Secondary | ICD-10-CM | POA: Diagnosis not present

## 2020-06-20 DIAGNOSIS — Z992 Dependence on renal dialysis: Secondary | ICD-10-CM | POA: Diagnosis not present

## 2020-06-20 DIAGNOSIS — I120 Hypertensive chronic kidney disease with stage 5 chronic kidney disease or end stage renal disease: Secondary | ICD-10-CM | POA: Diagnosis not present

## 2020-06-20 DIAGNOSIS — M3210 Systemic lupus erythematosus, organ or system involvement unspecified: Secondary | ICD-10-CM | POA: Diagnosis not present

## 2020-06-20 DIAGNOSIS — R52 Pain, unspecified: Secondary | ICD-10-CM | POA: Diagnosis not present

## 2020-06-22 DIAGNOSIS — Z992 Dependence on renal dialysis: Secondary | ICD-10-CM | POA: Diagnosis not present

## 2020-06-22 DIAGNOSIS — N2581 Secondary hyperparathyroidism of renal origin: Secondary | ICD-10-CM | POA: Diagnosis not present

## 2020-06-22 DIAGNOSIS — R52 Pain, unspecified: Secondary | ICD-10-CM | POA: Diagnosis not present

## 2020-06-22 DIAGNOSIS — M3210 Systemic lupus erythematosus, organ or system involvement unspecified: Secondary | ICD-10-CM | POA: Diagnosis not present

## 2020-06-22 DIAGNOSIS — D631 Anemia in chronic kidney disease: Secondary | ICD-10-CM | POA: Diagnosis not present

## 2020-06-22 DIAGNOSIS — N186 End stage renal disease: Secondary | ICD-10-CM | POA: Diagnosis not present

## 2020-06-23 DIAGNOSIS — M7061 Trochanteric bursitis, right hip: Secondary | ICD-10-CM | POA: Diagnosis not present

## 2020-06-23 DIAGNOSIS — M5459 Other low back pain: Secondary | ICD-10-CM | POA: Diagnosis not present

## 2020-06-24 ENCOUNTER — Other Ambulatory Visit: Payer: Self-pay | Admitting: Orthopedic Surgery

## 2020-06-24 DIAGNOSIS — M3210 Systemic lupus erythematosus, organ or system involvement unspecified: Secondary | ICD-10-CM | POA: Diagnosis not present

## 2020-06-24 DIAGNOSIS — R52 Pain, unspecified: Secondary | ICD-10-CM | POA: Diagnosis not present

## 2020-06-24 DIAGNOSIS — M545 Low back pain, unspecified: Secondary | ICD-10-CM

## 2020-06-24 DIAGNOSIS — Z992 Dependence on renal dialysis: Secondary | ICD-10-CM | POA: Diagnosis not present

## 2020-06-24 DIAGNOSIS — N2581 Secondary hyperparathyroidism of renal origin: Secondary | ICD-10-CM | POA: Diagnosis not present

## 2020-06-24 DIAGNOSIS — D631 Anemia in chronic kidney disease: Secondary | ICD-10-CM | POA: Diagnosis not present

## 2020-06-24 DIAGNOSIS — N186 End stage renal disease: Secondary | ICD-10-CM | POA: Diagnosis not present

## 2020-06-27 DIAGNOSIS — D631 Anemia in chronic kidney disease: Secondary | ICD-10-CM | POA: Diagnosis not present

## 2020-06-27 DIAGNOSIS — Z992 Dependence on renal dialysis: Secondary | ICD-10-CM | POA: Diagnosis not present

## 2020-06-27 DIAGNOSIS — N2581 Secondary hyperparathyroidism of renal origin: Secondary | ICD-10-CM | POA: Diagnosis not present

## 2020-06-27 DIAGNOSIS — R52 Pain, unspecified: Secondary | ICD-10-CM | POA: Diagnosis not present

## 2020-06-27 DIAGNOSIS — M3210 Systemic lupus erythematosus, organ or system involvement unspecified: Secondary | ICD-10-CM | POA: Diagnosis not present

## 2020-06-27 DIAGNOSIS — N186 End stage renal disease: Secondary | ICD-10-CM | POA: Diagnosis not present

## 2020-06-30 ENCOUNTER — Other Ambulatory Visit: Payer: Self-pay

## 2020-06-30 ENCOUNTER — Ambulatory Visit (INDEPENDENT_AMBULATORY_CARE_PROVIDER_SITE_OTHER): Payer: Medicare Other | Admitting: *Deleted

## 2020-06-30 DIAGNOSIS — Z954 Presence of other heart-valve replacement: Secondary | ICD-10-CM | POA: Diagnosis not present

## 2020-06-30 DIAGNOSIS — Z952 Presence of prosthetic heart valve: Secondary | ICD-10-CM

## 2020-06-30 DIAGNOSIS — Z5181 Encounter for therapeutic drug level monitoring: Secondary | ICD-10-CM | POA: Diagnosis not present

## 2020-06-30 LAB — POCT INR: INR: 3.7 — AB (ref 2.0–3.0)

## 2020-06-30 NOTE — Patient Instructions (Signed)
Description   Hold warfarin today and then start taking 3 tablets daily except for 2 tablets on Sundays and Thursdays. Recheck INR in 3 weeks. Coumadin Clinic [phone].

## 2020-07-01 DIAGNOSIS — M3210 Systemic lupus erythematosus, organ or system involvement unspecified: Secondary | ICD-10-CM | POA: Diagnosis not present

## 2020-07-01 DIAGNOSIS — Z992 Dependence on renal dialysis: Secondary | ICD-10-CM | POA: Diagnosis not present

## 2020-07-01 DIAGNOSIS — N186 End stage renal disease: Secondary | ICD-10-CM | POA: Diagnosis not present

## 2020-07-01 DIAGNOSIS — N2581 Secondary hyperparathyroidism of renal origin: Secondary | ICD-10-CM | POA: Diagnosis not present

## 2020-07-01 DIAGNOSIS — R52 Pain, unspecified: Secondary | ICD-10-CM | POA: Diagnosis not present

## 2020-07-01 DIAGNOSIS — D631 Anemia in chronic kidney disease: Secondary | ICD-10-CM | POA: Diagnosis not present

## 2020-07-04 ENCOUNTER — Other Ambulatory Visit: Payer: Self-pay | Admitting: Pharmacist

## 2020-07-04 DIAGNOSIS — N186 End stage renal disease: Secondary | ICD-10-CM | POA: Diagnosis not present

## 2020-07-04 DIAGNOSIS — M3210 Systemic lupus erythematosus, organ or system involvement unspecified: Secondary | ICD-10-CM | POA: Diagnosis not present

## 2020-07-04 DIAGNOSIS — N2581 Secondary hyperparathyroidism of renal origin: Secondary | ICD-10-CM | POA: Diagnosis not present

## 2020-07-04 DIAGNOSIS — R52 Pain, unspecified: Secondary | ICD-10-CM | POA: Diagnosis not present

## 2020-07-04 DIAGNOSIS — Z992 Dependence on renal dialysis: Secondary | ICD-10-CM | POA: Diagnosis not present

## 2020-07-04 DIAGNOSIS — D631 Anemia in chronic kidney disease: Secondary | ICD-10-CM | POA: Diagnosis not present

## 2020-07-04 MED ORDER — WARFARIN SODIUM 3 MG PO TABS: ORAL_TABLET | ORAL | 0 refills | Status: AC

## 2020-07-06 DIAGNOSIS — M3210 Systemic lupus erythematosus, organ or system involvement unspecified: Secondary | ICD-10-CM | POA: Diagnosis not present

## 2020-07-06 DIAGNOSIS — Z992 Dependence on renal dialysis: Secondary | ICD-10-CM | POA: Diagnosis not present

## 2020-07-06 DIAGNOSIS — R52 Pain, unspecified: Secondary | ICD-10-CM | POA: Diagnosis not present

## 2020-07-06 DIAGNOSIS — D631 Anemia in chronic kidney disease: Secondary | ICD-10-CM | POA: Diagnosis not present

## 2020-07-06 DIAGNOSIS — N2581 Secondary hyperparathyroidism of renal origin: Secondary | ICD-10-CM | POA: Diagnosis not present

## 2020-07-06 DIAGNOSIS — N186 End stage renal disease: Secondary | ICD-10-CM | POA: Diagnosis not present

## 2020-07-08 DIAGNOSIS — D631 Anemia in chronic kidney disease: Secondary | ICD-10-CM | POA: Diagnosis not present

## 2020-07-08 DIAGNOSIS — I350 Nonrheumatic aortic (valve) stenosis: Secondary | ICD-10-CM | POA: Diagnosis not present

## 2020-07-08 DIAGNOSIS — N2581 Secondary hyperparathyroidism of renal origin: Secondary | ICD-10-CM | POA: Diagnosis not present

## 2020-07-08 DIAGNOSIS — I050 Rheumatic mitral stenosis: Secondary | ICD-10-CM | POA: Diagnosis not present

## 2020-07-08 DIAGNOSIS — R52 Pain, unspecified: Secondary | ICD-10-CM | POA: Diagnosis not present

## 2020-07-08 DIAGNOSIS — M3210 Systemic lupus erythematosus, organ or system involvement unspecified: Secondary | ICD-10-CM | POA: Diagnosis not present

## 2020-07-08 DIAGNOSIS — N186 End stage renal disease: Secondary | ICD-10-CM | POA: Diagnosis not present

## 2020-07-08 DIAGNOSIS — J9811 Atelectasis: Secondary | ICD-10-CM | POA: Diagnosis not present

## 2020-07-08 DIAGNOSIS — Z992 Dependence on renal dialysis: Secondary | ICD-10-CM | POA: Diagnosis not present

## 2020-07-11 DIAGNOSIS — N186 End stage renal disease: Secondary | ICD-10-CM | POA: Diagnosis not present

## 2020-07-11 DIAGNOSIS — N2581 Secondary hyperparathyroidism of renal origin: Secondary | ICD-10-CM | POA: Diagnosis not present

## 2020-07-11 DIAGNOSIS — M3210 Systemic lupus erythematosus, organ or system involvement unspecified: Secondary | ICD-10-CM | POA: Diagnosis not present

## 2020-07-11 DIAGNOSIS — D631 Anemia in chronic kidney disease: Secondary | ICD-10-CM | POA: Diagnosis not present

## 2020-07-11 DIAGNOSIS — Z992 Dependence on renal dialysis: Secondary | ICD-10-CM | POA: Diagnosis not present

## 2020-07-11 DIAGNOSIS — R52 Pain, unspecified: Secondary | ICD-10-CM | POA: Diagnosis not present

## 2020-07-12 ENCOUNTER — Other Ambulatory Visit: Payer: Medicare Other

## 2020-07-15 ENCOUNTER — Emergency Department (HOSPITAL_COMMUNITY): Payer: Medicare Other

## 2020-07-15 ENCOUNTER — Encounter (HOSPITAL_COMMUNITY): Payer: Self-pay | Admitting: Pharmacy Technician

## 2020-07-15 ENCOUNTER — Other Ambulatory Visit: Payer: Self-pay

## 2020-07-15 ENCOUNTER — Inpatient Hospital Stay (HOSPITAL_COMMUNITY)
Admission: EM | Admit: 2020-07-15 | Discharge: 2020-07-19 | DRG: 393 | Disposition: E | Payer: Medicare Other | Attending: Pulmonary Disease | Admitting: Pulmonary Disease

## 2020-07-15 DIAGNOSIS — Z4682 Encounter for fitting and adjustment of non-vascular catheter: Secondary | ICD-10-CM | POA: Diagnosis not present

## 2020-07-15 DIAGNOSIS — E876 Hypokalemia: Secondary | ICD-10-CM | POA: Diagnosis not present

## 2020-07-15 DIAGNOSIS — Z952 Presence of prosthetic heart valve: Secondary | ICD-10-CM

## 2020-07-15 DIAGNOSIS — R791 Abnormal coagulation profile: Secondary | ICD-10-CM | POA: Diagnosis not present

## 2020-07-15 DIAGNOSIS — Z841 Family history of disorders of kidney and ureter: Secondary | ICD-10-CM

## 2020-07-15 DIAGNOSIS — R079 Chest pain, unspecified: Secondary | ICD-10-CM | POA: Diagnosis not present

## 2020-07-15 DIAGNOSIS — D631 Anemia in chronic kidney disease: Secondary | ICD-10-CM | POA: Diagnosis present

## 2020-07-15 DIAGNOSIS — E875 Hyperkalemia: Secondary | ICD-10-CM | POA: Diagnosis not present

## 2020-07-15 DIAGNOSIS — Z992 Dependence on renal dialysis: Secondary | ICD-10-CM | POA: Diagnosis not present

## 2020-07-15 DIAGNOSIS — R578 Other shock: Secondary | ICD-10-CM | POA: Diagnosis not present

## 2020-07-15 DIAGNOSIS — N186 End stage renal disease: Secondary | ICD-10-CM | POA: Diagnosis not present

## 2020-07-15 DIAGNOSIS — D6869 Other thrombophilia: Secondary | ICD-10-CM | POA: Diagnosis present

## 2020-07-15 DIAGNOSIS — Z452 Encounter for adjustment and management of vascular access device: Secondary | ICD-10-CM

## 2020-07-15 DIAGNOSIS — J9811 Atelectasis: Secondary | ICD-10-CM | POA: Diagnosis not present

## 2020-07-15 DIAGNOSIS — D649 Anemia, unspecified: Secondary | ICD-10-CM

## 2020-07-15 DIAGNOSIS — T45515A Adverse effect of anticoagulants, initial encounter: Secondary | ICD-10-CM | POA: Diagnosis present

## 2020-07-15 DIAGNOSIS — Z87891 Personal history of nicotine dependence: Secondary | ICD-10-CM

## 2020-07-15 DIAGNOSIS — D72829 Elevated white blood cell count, unspecified: Secondary | ICD-10-CM | POA: Diagnosis not present

## 2020-07-15 DIAGNOSIS — R000 Tachycardia, unspecified: Secondary | ICD-10-CM | POA: Diagnosis not present

## 2020-07-15 DIAGNOSIS — I132 Hypertensive heart and chronic kidney disease with heart failure and with stage 5 chronic kidney disease, or end stage renal disease: Secondary | ICD-10-CM | POA: Diagnosis present

## 2020-07-15 DIAGNOSIS — I700 Atherosclerosis of aorta: Secondary | ICD-10-CM | POA: Diagnosis not present

## 2020-07-15 DIAGNOSIS — N261 Atrophy of kidney (terminal): Secondary | ICD-10-CM | POA: Diagnosis not present

## 2020-07-15 DIAGNOSIS — K219 Gastro-esophageal reflux disease without esophagitis: Secondary | ICD-10-CM | POA: Diagnosis present

## 2020-07-15 DIAGNOSIS — I120 Hypertensive chronic kidney disease with stage 5 chronic kidney disease or end stage renal disease: Secondary | ICD-10-CM | POA: Diagnosis not present

## 2020-07-15 DIAGNOSIS — N2581 Secondary hyperparathyroidism of renal origin: Secondary | ICD-10-CM | POA: Diagnosis present

## 2020-07-15 DIAGNOSIS — K449 Diaphragmatic hernia without obstruction or gangrene: Secondary | ICD-10-CM | POA: Diagnosis not present

## 2020-07-15 DIAGNOSIS — Z7901 Long term (current) use of anticoagulants: Secondary | ICD-10-CM

## 2020-07-15 DIAGNOSIS — K661 Hemoperitoneum: Principal | ICD-10-CM

## 2020-07-15 DIAGNOSIS — D6832 Hemorrhagic disorder due to extrinsic circulating anticoagulants: Secondary | ICD-10-CM | POA: Diagnosis present

## 2020-07-15 DIAGNOSIS — R54 Age-related physical debility: Secondary | ICD-10-CM | POA: Diagnosis present

## 2020-07-15 DIAGNOSIS — R1084 Generalized abdominal pain: Secondary | ICD-10-CM | POA: Diagnosis not present

## 2020-07-15 DIAGNOSIS — R109 Unspecified abdominal pain: Secondary | ICD-10-CM | POA: Diagnosis not present

## 2020-07-15 DIAGNOSIS — R680 Hypothermia, not associated with low environmental temperature: Secondary | ICD-10-CM | POA: Diagnosis present

## 2020-07-15 DIAGNOSIS — A419 Sepsis, unspecified organism: Secondary | ICD-10-CM | POA: Diagnosis not present

## 2020-07-15 DIAGNOSIS — K5730 Diverticulosis of large intestine without perforation or abscess without bleeding: Secondary | ICD-10-CM | POA: Diagnosis not present

## 2020-07-15 DIAGNOSIS — Z20822 Contact with and (suspected) exposure to covid-19: Secondary | ICD-10-CM | POA: Diagnosis present

## 2020-07-15 DIAGNOSIS — J690 Pneumonitis due to inhalation of food and vomit: Secondary | ICD-10-CM | POA: Diagnosis not present

## 2020-07-15 DIAGNOSIS — I959 Hypotension, unspecified: Secondary | ICD-10-CM | POA: Diagnosis not present

## 2020-07-15 DIAGNOSIS — Z01818 Encounter for other preprocedural examination: Secondary | ICD-10-CM

## 2020-07-15 DIAGNOSIS — D62 Acute posthemorrhagic anemia: Secondary | ICD-10-CM | POA: Diagnosis not present

## 2020-07-15 DIAGNOSIS — J9601 Acute respiratory failure with hypoxia: Secondary | ICD-10-CM | POA: Diagnosis not present

## 2020-07-15 DIAGNOSIS — M329 Systemic lupus erythematosus, unspecified: Secondary | ICD-10-CM | POA: Diagnosis present

## 2020-07-15 DIAGNOSIS — Z79899 Other long term (current) drug therapy: Secondary | ICD-10-CM

## 2020-07-15 DIAGNOSIS — I468 Cardiac arrest due to other underlying condition: Secondary | ICD-10-CM | POA: Diagnosis not present

## 2020-07-15 DIAGNOSIS — E872 Acidosis, unspecified: Secondary | ICD-10-CM | POA: Insufficient documentation

## 2020-07-15 DIAGNOSIS — D689 Coagulation defect, unspecified: Secondary | ICD-10-CM | POA: Diagnosis not present

## 2020-07-15 DIAGNOSIS — K5900 Constipation, unspecified: Secondary | ICD-10-CM | POA: Diagnosis not present

## 2020-07-15 DIAGNOSIS — G9341 Metabolic encephalopathy: Secondary | ICD-10-CM | POA: Diagnosis present

## 2020-07-15 DIAGNOSIS — K922 Gastrointestinal hemorrhage, unspecified: Secondary | ICD-10-CM | POA: Diagnosis present

## 2020-07-15 DIAGNOSIS — I5030 Unspecified diastolic (congestive) heart failure: Secondary | ICD-10-CM | POA: Diagnosis not present

## 2020-07-15 DIAGNOSIS — Z8673 Personal history of transient ischemic attack (TIA), and cerebral infarction without residual deficits: Secondary | ICD-10-CM

## 2020-07-15 DIAGNOSIS — J984 Other disorders of lung: Secondary | ICD-10-CM | POA: Diagnosis not present

## 2020-07-15 DIAGNOSIS — M1990 Unspecified osteoarthritis, unspecified site: Secondary | ICD-10-CM | POA: Diagnosis present

## 2020-07-15 DIAGNOSIS — R6521 Severe sepsis with septic shock: Secondary | ICD-10-CM | POA: Diagnosis not present

## 2020-07-15 DIAGNOSIS — K7589 Other specified inflammatory liver diseases: Secondary | ICD-10-CM | POA: Diagnosis present

## 2020-07-15 DIAGNOSIS — E162 Hypoglycemia, unspecified: Secondary | ICD-10-CM | POA: Diagnosis present

## 2020-07-15 DIAGNOSIS — M3214 Glomerular disease in systemic lupus erythematosus: Secondary | ICD-10-CM | POA: Diagnosis present

## 2020-07-15 DIAGNOSIS — Z8249 Family history of ischemic heart disease and other diseases of the circulatory system: Secondary | ICD-10-CM

## 2020-07-15 DIAGNOSIS — I5032 Chronic diastolic (congestive) heart failure: Secondary | ICD-10-CM | POA: Diagnosis not present

## 2020-07-15 DIAGNOSIS — I469 Cardiac arrest, cause unspecified: Secondary | ICD-10-CM | POA: Diagnosis not present

## 2020-07-15 LAB — CBC WITH DIFFERENTIAL/PLATELET
Abs Immature Granulocytes: 1.64 10*3/uL — ABNORMAL HIGH (ref 0.00–0.07)
Basophils Absolute: 0.1 10*3/uL (ref 0.0–0.1)
Basophils Relative: 0 %
Eosinophils Absolute: 0.2 10*3/uL (ref 0.0–0.5)
Eosinophils Relative: 1 %
HCT: 27.8 % — ABNORMAL LOW (ref 36.0–46.0)
Hemoglobin: 8.7 g/dL — ABNORMAL LOW (ref 12.0–15.0)
Immature Granulocytes: 10 %
Lymphocytes Relative: 9 %
Lymphs Abs: 1.4 10*3/uL (ref 0.7–4.0)
MCH: 32.2 pg (ref 26.0–34.0)
MCHC: 31.3 g/dL (ref 30.0–36.0)
MCV: 103 fL — ABNORMAL HIGH (ref 80.0–100.0)
Monocytes Absolute: 0.7 10*3/uL (ref 0.1–1.0)
Monocytes Relative: 5 %
Neutro Abs: 11.8 10*3/uL — ABNORMAL HIGH (ref 1.7–7.7)
Neutrophils Relative %: 75 %
Platelets: 413 10*3/uL — ABNORMAL HIGH (ref 150–400)
RBC: 2.7 MIL/uL — ABNORMAL LOW (ref 3.87–5.11)
RDW: 17.3 % — ABNORMAL HIGH (ref 11.5–15.5)
WBC: 15.8 10*3/uL — ABNORMAL HIGH (ref 4.0–10.5)
nRBC: 2.3 % — ABNORMAL HIGH (ref 0.0–0.2)

## 2020-07-15 LAB — PROTIME-INR
INR: >10 (ref 0.8–1.2)
Prothrombin Time: >90 seconds — ABNORMAL HIGH (ref 11.4–15.2)

## 2020-07-15 LAB — COMPREHENSIVE METABOLIC PANEL
ALT: 22 U/L (ref 0–44)
ALT: 69 U/L — ABNORMAL HIGH (ref 0–44)
AST: 23 U/L (ref 15–41)
AST: 128 U/L — ABNORMAL HIGH (ref 15–41)
Albumin: 2.7 g/dL — ABNORMAL LOW (ref 3.5–5.0)
Albumin: 2.4 g/dL — ABNORMAL LOW (ref 3.5–5.0)
Alkaline Phosphatase: 164 U/L — ABNORMAL HIGH (ref 38–126)
Alkaline Phosphatase: 115 U/L (ref 38–126)
Anion gap: 24 — ABNORMAL HIGH (ref 5–15)
Anion gap: 24 — ABNORMAL HIGH (ref 5–15)
BUN: 85 mg/dL — ABNORMAL HIGH (ref 6–20)
BUN: 90 mg/dL — ABNORMAL HIGH (ref 6–20)
CO2: 20 mmol/L — ABNORMAL LOW (ref 22–32)
CO2: 19 mmol/L — ABNORMAL LOW (ref 22–32)
Calcium: 8.7 mg/dL — ABNORMAL LOW (ref 8.9–10.3)
Calcium: 8 mg/dL — ABNORMAL LOW (ref 8.9–10.3)
Chloride: 88 mmol/L — ABNORMAL LOW (ref 98–111)
Chloride: 90 mmol/L — ABNORMAL LOW (ref 98–111)
Creatinine, Ser: 9.37 mg/dL — ABNORMAL HIGH (ref 0.44–1.00)
Creatinine, Ser: 9.46 mg/dL — ABNORMAL HIGH (ref 0.44–1.00)
GFR, Estimated: 4 mL/min — ABNORMAL LOW (ref >60)
GFR, Estimated: 4 mL/min — ABNORMAL LOW (ref >60)
Glucose, Bld: 119 mg/dL — ABNORMAL HIGH (ref 70–99)
Glucose, Bld: 125 mg/dL — ABNORMAL HIGH (ref 70–99)
Potassium: 5.1 mmol/L (ref 3.5–5.1)
Potassium: 5.3 mmol/L — ABNORMAL HIGH (ref 3.5–5.1)
Sodium: 132 mmol/L — ABNORMAL LOW (ref 135–145)
Sodium: 133 mmol/L — ABNORMAL LOW (ref 135–145)
Total Bilirubin: 0.8 mg/dL (ref 0.3–1.2)
Total Bilirubin: 0.8 mg/dL (ref 0.3–1.2)
Total Protein: 6.9 g/dL (ref 6.5–8.1)
Total Protein: 5.8 g/dL — ABNORMAL LOW (ref 6.5–8.1)

## 2020-07-15 LAB — POC OCCULT BLOOD, ED: Fecal Occult Bld: POSITIVE — AB

## 2020-07-15 LAB — CBG MONITORING, ED
Glucose-Capillary: 24 mg/dL — CL (ref 70–99)
Glucose-Capillary: 100 mg/dL — ABNORMAL HIGH (ref 70–99)
Glucose-Capillary: 65 mg/dL — ABNORMAL LOW (ref 70–99)
Glucose-Capillary: 156 mg/dL — ABNORMAL HIGH (ref 70–99)
Glucose-Capillary: 63 mg/dL — ABNORMAL LOW (ref 70–99)
Glucose-Capillary: 123 mg/dL — ABNORMAL HIGH (ref 70–99)
Glucose-Capillary: 69 mg/dL — ABNORMAL LOW (ref 70–99)
Glucose-Capillary: 86 mg/dL (ref 70–99)
Glucose-Capillary: 147 mg/dL — ABNORMAL HIGH (ref 70–99)

## 2020-07-15 LAB — RESP PANEL BY RT-PCR (FLU A&B, COVID) ARPGX2
Influenza A by PCR: NEGATIVE
Influenza B by PCR: NEGATIVE
SARS Coronavirus 2 by RT PCR: NEGATIVE

## 2020-07-15 LAB — HEMOGLOBIN A1C
Hgb A1c MFr Bld: 5.4 % (ref 4.8–5.6)
Mean Plasma Glucose: 108.28 mg/dL

## 2020-07-15 LAB — APTT: aPTT: 159 seconds — ABNORMAL HIGH (ref 24–36)

## 2020-07-15 LAB — LACTIC ACID, PLASMA
Lactic Acid, Venous: 8.6 mmol/L (ref 0.5–1.9)
Lactic Acid, Venous: 4.1 mmol/L (ref 0.5–1.9)

## 2020-07-15 LAB — LIPASE, BLOOD: Lipase: 20 U/L (ref 11–51)

## 2020-07-15 LAB — PREPARE RBC (CROSSMATCH)

## 2020-07-15 LAB — GLUCOSE, CAPILLARY: Glucose-Capillary: 110 mg/dL — ABNORMAL HIGH (ref 70–99)

## 2020-07-15 MED ORDER — IOHEXOL 350 MG/ML SOLN
100.0000 mL | Freq: Once | INTRAVENOUS | Status: AC | PRN
  Administered 2020-07-15: 100 mL via INTRAVENOUS

## 2020-07-15 MED ORDER — MORPHINE SULFATE (PF) 4 MG/ML IV SOLN
4.0000 mg | Freq: Once | INTRAVENOUS | Status: AC
  Administered 2020-07-15: 4 mg via INTRAVENOUS
  Filled 2020-07-15: qty 1

## 2020-07-15 MED ORDER — SODIUM CHLORIDE 0.9 % IV SOLN
10.0000 mL/h | Freq: Once | INTRAVENOUS | Status: AC
  Administered 2020-07-15: 10 mL/h via INTRAVENOUS

## 2020-07-15 MED ORDER — CHLORHEXIDINE GLUCONATE CLOTH 2 % EX PADS
6.0000 | MEDICATED_PAD | Freq: Every day | CUTANEOUS | Status: DC
  Administered 2020-07-16 – 2020-07-17 (×2): 6 via TOPICAL

## 2020-07-15 MED ORDER — PHYTONADIONE 5 MG PO TABS
10.0000 mg | ORAL_TABLET | Freq: Once | ORAL | Status: AC
  Administered 2020-07-15: 10 mg via ORAL
  Filled 2020-07-15: qty 2

## 2020-07-15 MED ORDER — SODIUM CHLORIDE 0.9 % IV SOLN
20.0000 ug | Freq: Once | INTRAVENOUS | Status: AC
  Administered 2020-07-15: 20 ug via INTRAVENOUS
  Filled 2020-07-15: qty 5

## 2020-07-15 MED ORDER — VITAMIN K1 10 MG/ML IJ SOLN
10.0000 mg | Freq: Once | INTRAVENOUS | Status: DC
  Filled 2020-07-15: qty 1

## 2020-07-15 MED ORDER — DEXTROSE 50 % IV SOLN
50.0000 mL | Freq: Once | INTRAVENOUS | Status: AC
  Administered 2020-07-15: 50 mL via INTRAVENOUS
  Filled 2020-07-15: qty 50

## 2020-07-15 MED ORDER — ONDANSETRON HCL 4 MG PO TABS
4.0000 mg | ORAL_TABLET | Freq: Four times a day (QID) | ORAL | Status: DC | PRN
  Filled 2020-07-15 (×2): qty 1

## 2020-07-15 MED ORDER — GLUCAGON HCL RDNA (DIAGNOSTIC) 1 MG IJ SOLR
1.0000 mg | Freq: Once | INTRAMUSCULAR | Status: AC
  Administered 2020-07-15: 1 mg via INTRAVENOUS
  Filled 2020-07-15: qty 1

## 2020-07-15 MED ORDER — CALCITRIOL 0.5 MCG PO CAPS
2.0000 ug | ORAL_CAPSULE | ORAL | Status: DC
  Filled 2020-07-15: qty 4

## 2020-07-15 MED ORDER — ONDANSETRON HCL 4 MG/2ML IJ SOLN
4.0000 mg | Freq: Once | INTRAMUSCULAR | Status: AC
  Administered 2020-07-15: 4 mg via INTRAVENOUS
  Filled 2020-07-15: qty 2

## 2020-07-15 MED ORDER — HYDROMORPHONE HCL 1 MG/ML IJ SOLN
0.5000 mg | INTRAMUSCULAR | Status: DC | PRN
Start: 2020-07-15 — End: 2020-07-16
  Administered 2020-07-15 – 2020-07-16 (×5): 1 mg via INTRAVENOUS
  Filled 2020-07-15 (×5): qty 1

## 2020-07-15 MED ORDER — ACETAMINOPHEN 650 MG RE SUPP: 650.0000 mg | Freq: Four times a day (QID) | RECTAL | Status: DC | PRN

## 2020-07-15 MED ORDER — DEXTROSE 50 % IV SOLN
1.0000 | Freq: Once | INTRAVENOUS | Status: AC
  Administered 2020-07-15: 50 mL via INTRAVENOUS
  Filled 2020-07-15: qty 50

## 2020-07-15 MED ORDER — ACETAMINOPHEN 500 MG PO TABS
1000.0000 mg | ORAL_TABLET | Freq: Once | ORAL | Status: AC
  Administered 2020-07-15: 1000 mg via ORAL
  Filled 2020-07-15: qty 2

## 2020-07-15 MED ORDER — SODIUM CHLORIDE 0.9 % IV BOLUS
500.0000 mL | Freq: Once | INTRAVENOUS | Status: AC
  Administered 2020-07-15: 500 mL via INTRAVENOUS

## 2020-07-15 MED ORDER — ACETAMINOPHEN 325 MG PO TABS
650.0000 mg | ORAL_TABLET | Freq: Four times a day (QID) | ORAL | Status: DC | PRN
  Administered 2020-07-16 – 2020-07-17 (×3): 650 mg via ORAL
  Filled 2020-07-15 (×3): qty 2

## 2020-07-15 MED ORDER — FERRIC CITRATE 1 GM 210 MG(FE) PO TABS
840.0000 mg | ORAL_TABLET | Freq: Three times a day (TID) | ORAL | Status: DC
  Administered 2020-07-15 – 2020-07-17 (×5): 840 mg via ORAL
  Filled 2020-07-15 (×5): qty 4

## 2020-07-15 MED ORDER — VITAMIN K1 10 MG/ML IJ SOLN
2.5000 mg | Freq: Once | INTRAVENOUS | Status: DC
  Filled 2020-07-15: qty 0.25

## 2020-07-15 MED ORDER — CINACALCET HCL 30 MG PO TABS
180.0000 mg | ORAL_TABLET | ORAL | Status: DC
  Filled 2020-07-15: qty 6

## 2020-07-15 MED ORDER — DARBEPOETIN ALFA 40 MCG/0.4ML IJ SOSY: 40.0000 ug | PREFILLED_SYRINGE | INTRAMUSCULAR | Status: DC

## 2020-07-15 MED ORDER — INSULIN ASPART 100 UNIT/ML ~~LOC~~ SOLN: 0.0000 [IU] | Freq: Three times a day (TID) | SUBCUTANEOUS | Status: DC

## 2020-07-15 MED ORDER — CINACALCET HCL 30 MG PO TABS: 120.0000 mg | ORAL_TABLET | ORAL | Status: DC

## 2020-07-15 NOTE — ED Notes (Signed)
Lactic acid 8.6 reported to Dr. Clint Lipps PA

## 2020-07-15 NOTE — ED Notes (Signed)
Patient transported to CT 

## 2020-07-15 NOTE — ED Notes (Signed)
1st unti of ffp infused  2nd unit ready to start but the computer will not acknowledge the bag

## 2020-07-15 NOTE — H&P (Addendum)
History and Physical    [name] PZW:[id number] DOB: 08/18/1960 DOA: 07/13/2020  PCP: Benito Mccreedy, MD (Confirm with patient/family/NH records and if not entered, this has to be entered at [facility] point of entry) Patient coming from: Home  I have personally briefly reviewed patient's old medical records in [place]  Chief Complaint: Abd pain  HPI: [name] is a 60 y.o. female with medical history significant of ESRD on HD, status post mitral aortic and mitral valve replacement on chronic Coumadin therapy, chronic anemia secondary to CKD, presented with new onset of abdominal pain.  Symptoms started 2 days ago, gradual getting worse, cramping like, associated with constipation.  Cramping abdominal became constant this morning, she has been using laxatives with minimal effect.  Denied any blood in the stool or black tarry stool.  No fever chills.  Denied any obvious bleeding.  Denied any abdominal injury. ED Course: INR >10.  CT angiogram chest abdomen showed large volume hemoperitoneum unclear source.  No evidence of active contrast extravasation.  Hemoglobin 8.7.  Review of Systems: As per HPI otherwise 14 point review of systems negative.    Past Medical History:  Diagnosis Date  . Anemia   . Arthritis    "joints" (10/23/2017)  . Blood transfusion '08   [facility]; "low HgB" (10/23/2017)  . Chronic diastolic CHF (congestive heart failure) ([place])   . Claustrophobia   . Dysfunctional uterine bleeding 12/19/2010  . ESRD (end stage renal disease) on dialysis [facility])    "MWF; [name]." (10/23/2017)  . GERD (gastroesophageal reflux disease)   . Headache   . Hemodialysis patient [facility])    right extremity port  . History of hiatal hernia   . Hx of cardiovascular stress test    Lexiscan Myoview 4/16:  Normal stress nuclear study, EF 59%  . Hypertension   . Lupus ([place])    "? kind" (10/23/2017)  . Mitral stenosis    Echo 4/16:  EF 55-60%, no RWMA, Gr 1 DD, mod MS (mean 9  mmHg), mod LAE, mild RAE, PASP 65, mod to severe TR, trivial eff // Echo 6/19:  Mild LVH, EF 55-60, no RWMA, Gr 2 DD, mild to mod AS (Mean 20), severe MS (mean 17), massive LAE, PASP 48, trivial effusion   . Peptic ulcer disease   . Pneumonia 10/21/2017  . S/P aortic valve replacement with metallic valve 09/11/5359   21 mm Sorin Carbomedics bileaflet mechanical valve  . S/P mitral valve replacement with metallic valve 4/43/1540   31 mm Sorin Carbomedics Optiform bileaflet mechanical valve  . Stroke [facility])    per patient "they said i had a small stroke but i couldnt even tell"  . Valvular heart disease (Aortic Stenosis and Mitral Stenosis) 09/25/2016   s/p mechanical AVR and MVR 11/2018 // Echo 02/2019: EF 65-70, mild LVH, mild LAE, normally functioning mechanical mitral valve prosthesis with no regurgitation or stenosis, trivial TR, normally functioning mechanical aortic valve prosthesis without regurgitation or stenosis     Past Surgical History:  Procedure Laterality Date  . AORTIC VALVE REPLACEMENT  12/02/2018   AORTIC VALVE REPLACEMENT (AVR) USING CARBOMEDICS SUPRA-ANNULAR TOP HAT SIZE 21MM (N/A)  . AORTIC VALVE REPLACEMENT N/A 12/02/2018   Procedure: AORTIC VALVE REPLACEMENT (AVR) USING CARBOMEDICS SUPRA-ANNULAR TOP HAT SIZE 21MM;  Surgeon: Rexene Alberts, MD;  Location: [place];  Service: Open Heart Surgery;  Laterality: N/A;  . AV FISTULA PLACEMENT Right 02/25/2018   Procedure: INSERTION OF 41mm x 16cm ARTEGRAFT;  Surgeon: Waynetta Sandy, MD;  Location: [place];  Service: Vascular;  Laterality: Right;  . COLONOSCOPY W/ BIOPSIES AND POLYPECTOMY    . DIALYSIS FISTULA CREATION  2007  . ENDOMETRIAL ABLATION    . ESOPHAGOGASTRODUODENOSCOPY N/A 07/31/2014   Procedure: ESOPHAGOGASTRODUODENOSCOPY (EGD);  Surgeon: Clarene Essex, MD;  Location: [facility] ENDOSCOPY;  Service: Endoscopy;  Laterality: N/A;  . FISTULOGRAM Right 02/25/2018   Procedure: FISTULOGRAM ARM;  Surgeon: Waynetta Sandy,  MD;  Location: [place];  Service: Vascular;  Laterality: Right;  . HEMATOMA EVACUATION Right 03/06/2018   Procedure: EVACUATION HEMATOMA RIGHT UPPER ARM;  Surgeon: Waynetta Sandy, MD;  Location: [place];  Service: Vascular;  Laterality: Right;  . INSERTION OF ARTERIOVENOUS (AV) ARTEGRAFT ARM Right 09/26/2017   Procedure: INSERTION OF ARTERIOVENOUS (AV) ARTEGRAFT INTO RIGHT ARM;  Surgeon: Waynetta Sandy, MD;  Location: [place];  Service: Vascular;  Laterality: Right;  . INSERTION OF DIALYSIS CATHETER Right 03/06/2018   Procedure: INSERTION OF DIALYSIS CATHETER, right internal jugular;  Surgeon: Waynetta Sandy, MD;  Location: [place];  Service: Vascular;  Laterality: Right;  . IR EMBO ART  VEN HEMORR [place]  01/27/2020  . IR RENAL SELECTIVE  UNI INC S&I MOD SED  01/27/2020  . IR THORACENTESIS ASP PLEURAL SPACE W/IMG GUIDE  01/07/2019  . IR US GUIDE VASC ACCESS RIGHT  01/27/2020  . MITRAL VALVE REPLACEMENT N/A 12/02/2018   Procedure: MITRAL VALVE (MV) REPLACEMENT USING CARBOMEDICS OPTIFORM SIZE 31MM;  Surgeon: Rexene Alberts, MD;  Location: [place];  Service: Open Heart Surgery;  Laterality: N/A;  . REVISON OF ARTERIOVENOUS FISTULA Right 09/26/2017   Procedure: REVISION OF ARTERIOVENOUS FISTULA RIGHT ARM WITH ARTEGRAFT;  Surgeon: Waynetta Sandy, MD;  Location: [place];  Service: Vascular;  Laterality: Right;  . REVISON OF ARTERIOVENOUS FISTULA Right 02/25/2018   Procedure: REVISION OF ARTERIOVENOUS FISTULA;  Surgeon: Waynetta Sandy, MD;  Location: [place];  Service: Vascular;  Laterality: Right;  . RIGHT/LEFT HEART CATH AND CORONARY ANGIOGRAPHY N/A 07/24/2018   Procedure: RIGHT/LEFT HEART CATH AND CORONARY ANGIOGRAPHY;  Surgeon: Larey Dresser, MD;  Location: [place] CV LAB;  Service: Cardiovascular;  Laterality: N/A;  . SHOULDER OPEN ROTATOR CUFF REPAIR Right 10/09/2016   Procedure: ROTATOR CUFF REPAIR SHOULDER OPEN partial acrominectomy  and extensive synovectomy;  Surgeon: Latanya [name], MD;  Location: [facility];  Service: Orthopedics;  Laterality: Right;  RNFA  . TEE WITHOUT CARDIOVERSION N/A 01/02/2018   Procedure: TRANSESOPHAGEAL ECHOCARDIOGRAM (TEE) Bubble Study;  Surgeon: Larey Dresser, MD;  Location: [facility] ENDOSCOPY;  Service: Cardiovascular;  Laterality: N/A;  . TEE WITHOUT CARDIOVERSION N/A 12/02/2018   Procedure: TRANSESOPHAGEAL ECHOCARDIOGRAM (TEE);  Surgeon: Rexene Alberts, MD;  Location: [place];  Service: Open Heart Surgery;  Laterality: N/A;  . TUBAL LIGATION       reports that she quit smoking about 19 months ago. Her smoking use included cigarettes. She started smoking about 2 years ago. She has a 1.00 pack-year smoking history. She has never used smokeless tobacco. She reports current alcohol use of about 1.0 standard drink of alcohol per week. She reports previous drug use. Drugs: Marijuana and Cocaine.  No Known Allergies  Family History  Problem Relation Age of Onset  . Liver cancer Maternal Grandmother   . Lymphoma Maternal Aunt   . Hypertension Mother   . Breast cancer Mother   . Renal Disease Father   . Hypertension Father   . Heart  attack Neg Hx      Prior to Admission medications   Medication Sig Start Date End Date Taking? Authorizing Provider  amoxicillin (AMOXIL) 500 MG capsule Take 4 capsules (2,000 mg total) by mouth once as needed (1 hour prior to any dental appointment/procedure including cleaning). 05/18/20   Loel Dubonnet, NP  AURYXIA 1 GM 210 MG(Fe) tablet Take 840 mg by mouth 3 (three) times daily. 12/04/19   [provider]  bisacodyl (DULCOLAX) 5 MG EC tablet Take 5 mg by mouth daily as needed for moderate constipation.    [provider]  calcitRIOL (ROCALTROL) 0.5 MCG capsule Take 4 capsules (2 mcg total) by mouth every Monday, Wednesday, and Friday. 02/12/20   Pokhrel, Corrie Mckusick, MD  cinacalcet (SENSIPAR) 30 MG tablet Take 4 tablets (120 mg total) by mouth every  Monday, Wednesday, and Friday with hemodialysis. 02/12/20   Pokhrel, Corrie Mckusick, MD  Methoxy PEG-Epoetin Beta (MIRCERA IJ) Done at dialysis 02/17/20 02/15/21  [provider]  metoprolol tartrate (LOPRESSOR) 25 MG tablet Take 1 tablet (25 mg total) by mouth 2 (two) times daily. 02/12/20   Pokhrel, Corrie Mckusick, MD  multivitamin (RENA-VIT) TABS tablet Take 1 tablet by mouth at bedtime.     [provider]  ondansetron (ZOFRAN) 4 MG tablet Take 4 mg by mouth every 6 (six) hours as needed for nausea or vomiting.    [provider]  pantoprazole (PROTONIX) 40 MG tablet Take 1 tablet (40 mg total) by mouth daily. 02/12/20   Pokhrel, Corrie Mckusick, MD  warfarin (COUMADIN) 3 MG tablet Take 2-3 tablets by mouth daily as instructed by the coumadin clinic. 07/04/20   Fay Records, MD    Physical Exam: Vitals:   07/14/2020 1445 07/11/2020 1500 07/03/2020 1533 07/06/2020 1600  BP: (!) 68/37 (!) 76/48 112/78 103/63  Pulse:   91   Resp: (!) 22 (!) 21 16 18   Temp:      TempSrc:      SpO2:   100%     Constitutional: NAD, calm, comfortable Vitals:   07/11/2020 1445 06/26/2020 1500 06/24/2020 1533 06/24/2020 1600  BP: (!) 68/37 (!) 76/48 112/78 103/63  Pulse:   91   Resp: (!) 22 (!) 21 16 18   Temp:      TempSrc:      SpO2:   100%    Eyes: PERRL, lids and conjunctivae normal ENMT: Mucous membranes are moist. Posterior pharynx clear of any exudate or lesions.Normal dentition.  Neck: normal, supple, no masses, no thyromegaly Respiratory: clear to auscultation bilaterally, no wheezing, no crackles. Normal respiratory effort. No accessory muscle use.  Cardiovascular: Regular rate and rhythm, no murmurs / rubs / gallops. No extremity edema. 2+ pedal pulses. No carotid bruits.  Abdomen: diffused tenderness, no rebound no guarding, no masses palpated. No hepatosplenomegaly. Bowel sounds positive.  Musculoskeletal: no clubbing / cyanosis. No joint deformity upper and lower extremities. Good ROM, no contractures.  Normal muscle tone.  Skin: no rashes, lesions, ulcers. No induration Neurologic: CN 2-12 grossly intact. Sensation intact, DTR normal. Strength 5/5 in all 4.  Psychiatric: Normal judgment and insight. Alert and oriented x 3. Normal mood.     Labs on Admission: I have personally reviewed following labs and imaging studies  CBC: Recent Labs  Lab 07/10/2020 1050  WBC 15.8*  NEUTROABS 11.8*  HGB 8.7*  HCT 27.8*  MCV 103.0*  PLT 638*   Basic Metabolic Panel: Recent Labs  Lab 07/14/2020 1050  NA 132*  K 5.1  CL 88*  CO2 20*  GLUCOSE 119*  BUN 85*  CREATININE 9.37*  CALCIUM 8.7*   GFR: CrCl cannot be calculated (Unknown ideal weight.). Liver Function Tests: Recent Labs  Lab 06/25/2020 1050  AST 23  ALT 22  ALKPHOS 164*  BILITOT 0.8  PROT 6.9  ALBUMIN 2.7*   Recent Labs  Lab 07/14/2020 1050  LIPASE 20   No results for input(s): AMMONIA in the last 168 hours. Coagulation Profile: Recent Labs  Lab 07/02/2020 1414  INR >10.0*   Cardiac Enzymes: No results for input(s): CKTOTAL, CKMB, CKMBINDEX, TROPONINI in the last 168 hours. BNP (last 3 results) No results for input(s): PROBNP in the last 8760 hours. HbA1C: No results for input(s): HGBA1C in the last 72 hours. CBG: Recent Labs  Lab 07/03/2020 1612  GLUCAP 24*   Lipid Profile: No results for input(s): CHOL, HDL, LDLCALC, TRIG, CHOLHDL, LDLDIRECT in the last 72 hours. Thyroid Function Tests: No results for input(s): TSH, T4TOTAL, FREET4, T3FREE, THYROIDAB in the last 72 hours. Anemia Panel: No results for input(s): VITAMINB12, FOLATE, FERRITIN, TIBC, IRON, RETICCTPCT in the last 72 hours. Urine analysis:    Component Value Date/Time   COLORURINE YELLOW 05/01/2015 2005   APPEARANCEUR CLEAR 05/01/2015 2005   LABSPEC 1.012 05/01/2015 2005   PHURINE 8.5 (H) 05/01/2015 2005   GLUCOSEU 100 (A) 05/01/2015 2005   HGBUR SMALL (A) 05/01/2015 2005   BILIRUBINUR NEGATIVE 05/01/2015 2005   KETONESUR NEGATIVE  05/01/2015 2005   PROTEINUR 100 (A) 05/01/2015 2005   UROBILINOGEN 0.2 07/27/2014 0150   NITRITE NEGATIVE 05/01/2015 2005   LEUKOCYTESUR NEGATIVE 05/01/2015 2005    Radiological Exams on Admission: CT Angio Chest/Abd/Pel for Dissection W and/or Wo Contrast  Result Date: 07/03/2020 CLINICAL DATA:  Severe lower abdominal pain and constipation for 2 days. History of left retroperitoneal hemorrhage status post coil embolization of left renal arteries 01/27/2020. End-stage renal disease on hemodialysis. EXAM: CT ANGIOGRAPHY CHEST, ABDOMEN AND PELVIS TECHNIQUE: Non-contrast CT of the chest was initially obtained. Multidetector CT imaging through the chest, abdomen and pelvis was performed using the standard protocol during bolus administration of intravenous contrast. Multiplanar reconstructed images and MIPs were obtained and reviewed to evaluate the vascular anatomy. CONTRAST:  154mL OMNIPAQUE IOHEXOL 350 MG/ML SOLN COMPARISON:  01/26/2020 CT abdomen/pelvis. 10/30/2018 CT angiogram of the chest, abdomen and pelvis. FINDINGS: CTA CHEST FINDINGS Cardiovascular: Stable mild cardiomegaly. No significant pericardial effusion/thickening. Aortic and mitral valve prostheses in place. Left anterior descending coronary atherosclerosis. Atherosclerotic nonaneurysmal thoracic aorta. No acute intramural hematoma, dissection, pseudoaneurysm or penetrating atherosclerotic ulcer in the thoracic aorta. Top-normal caliber main pulmonary artery (3.3 cm diameter). No central pulmonary emboli. Mediastinum/Nodes: No discrete thyroid nodules. Mildly patulous thoracic esophagus with fluid level in mid to lower thoracic esophagus. No pathologically enlarged axillary, mediastinal or hilar lymph nodes. Lungs/Pleura: No pneumothorax. No pleural effusion. A few new scattered indistinct small right pulmonary nodules, largest 4 mm in the peripheral apical right upper lobe (series 8/image 31). Mild-to-moderate platelike atelectasis in the  mid to lower lungs bilaterally, right greater than left. No acute consolidative airspace disease or lung masses. Musculoskeletal: No aggressive appearing focal osseous lesions. Intact sternotomy wires. Generalized sclerosis in the thoracic osseous structures, similar, compatible with renal osteodystrophy. Review of the MIP images confirms the above findings. CTA ABDOMEN AND PELVIS FINDINGS VASCULAR Aorta: Atherosclerotic nonaneurysmal abdominal aorta with no dissection or significant stenosis. Celiac: Atherosclerotic without evidence of aneurysm, dissection, vasculitis or significant stenosis. SMA: Atherosclerotic without evidence of aneurysm, dissection  or vasculitis. There is irregular multifocal moderate to high-grade stenosis throughout the entire SMA. Renals: Coil embolization of left renal artery near the left renal sinus. Irregular high-grade stenosis of the proximal right renal artery. No pseudoaneurysm. IMA: Patent without evidence of aneurysm, dissection, vasculitis or significant stenosis. Inflow: Patent without evidence of aneurysm, dissection, vasculitis or significant stenosis. Veins: No obvious venous abnormality within the limitations of this arterial phase study. Review of the MIP images confirms the above findings. NON-VASCULAR Hepatobiliary: Normal liver size. Scattered subcentimeter hypodense liver lesions are too small to characterize and are unchanged, presumably benign. No new liver lesions. Normal gallbladder with no radiopaque cholelithiasis. No biliary ductal dilatation. Pancreas: Normal, with no mass or duct dilation. Spleen: Atrophic calcified spleen, unchanged.  No splenic masses. Adrenals/Urinary Tract: Normal adrenals. Symmetric severe atrophy of kidneys bilaterally. No hydronephrosis. Numerous small simple bilateral renal cysts, largest 1.7 cm in the posterior lower right kidney. Hyperdense subcentimeter posterior lower left renal cortical lesion (series 6/image 96), requiring no  follow-up. Bladder is completely collapsed and grossly normal. Stomach/Bowel: Moderate hiatal hernia. Otherwise normal nondistended stomach. Normal caliber small bowel with no small bowel wall thickening. Normal appendix. Moderate diffuse colonic diverticulosis with no large bowel wall thickening or significant pericolonic fat stranding. Vascular/Lymphatic: No pathologically enlarged lymph nodes in the abdomen or pelvis. Reproductive: Mildly enlarged myomatous uterus with coarsely calcified degenerated uterine fibroids, largest 2.3 cm, unchanged. No discrete adnexal masses. Other: No pneumoperitoneum. New large volume hemoperitoneum, most prominent in the perihepatic space and anterior pelvic peritoneum surrounding the uterus and superior to the bladder. No evidence of active contrast extravasation. Musculoskeletal: No aggressive appearing focal osseous lesions. Diffuse sclerosis throughout abdominopelvic skeleton, similar, compatible with renal osteodystrophy. Mild lumbar spondylosis. Review of the MIP images confirms the above findings. IMPRESSION: 1. New large volume hemoperitoneum of unclear source, most prominent in the perihepatic space and anterior pelvic peritoneum surrounding the uterus and superior to the bladder. No evidence of active contrast extravasation. No acute aortic syndrome. 2. Atherosclerotic stenoses throughout the SMA and proximal right renal artery as detailed. Coil embolization of left renal artery near the left renal sinus. No pseudoaneurysm identified. 3. Moderate hiatal hernia. Mildly patulous thoracic esophagus with fluid level, suggesting esophageal dysmotility and/or gastroesophageal reflux. 4. A few new scattered indistinct small right pulmonary nodules, largest 4 mm, probably inflammatory. Recommend attention on follow-up chest CT in 3-6 months. 5. Moderate diffuse colonic diverticulosis. 6. Renal osteodystrophy. 7. Mildly enlarged myomatous uterus. 8. Aortic Atherosclerosis  (ICD10-I70.0). Critical Value/emergent results were called by telephone at the time of interpretation on 07/11/2020 at 2:06 pm to provider [facility], who verbally acknowledged these results. Electronically Signed   By: Ilona Sorrel M.D.   On: 07/10/2020 14:09    EKG: Independently reviewed. Sinus Tachy  Assessment/Plan Active Problems:   Hemoperitoneum   GI bleed  (please populate well all problems here in Problem List. (For example, if patient is on BP meds at home and you resume or decide to hold them, it is a problem that needs to be her. Same for CAD, COPD, HLD and so on)  Acute on chronic blood loss anemia -Secondary to large hemoperitoneum -To receive PRBCx1, FFPx2.  Repeat H&H at 2300 and then tomorrow morning, repeat transfusion if significant H&H drop or hemodynamically unstable. -Received one dose of DDAVP -Quick curbside phone call conversation with on-call GI,  Appears lacking clear evidence of using Sandostatin this case with no diagnose of Cirrhosis/Portal HTN in her history. Will hold  off Sandostatin. -Plan to complete transfusion PRBC and FFP, repeat H&H tonight, if significant drop to give further transfusion plus another stat CT angiogram and possible IR intervention. -NPO for now, until H/H stabilized. -Condition is critical with large size bleeding and multiple co-morbidities, expect more than 2 midnight hospital stay.  Coumadin induced coagulopathy -FFP x2, vitamin K x1 -Daily INR.  Hypoglycemia -No Hx of Diabetes -D50 x1 -Check FS Q4H -Cortisol level  SIRS -Elevated Lactic acid, and tachycardia, but both can be attributed to significant blood loss, and so far no clear source of infection identified. -Check blood culture, -Monitor off ABX  Leukocytosis -Probably related to bleeding -Monitor off ABX.  ESRD on HD -Resume scheduled HD.  DVT prophylaxis: ACD Code Status: Full Code Family Communication: None at bedside Disposition Plan: Expect more than  2 midnight hospital stay Consults called: ICU, Nephro, general surgery Admission status: PCU   Lequita Halt MD [facility] Pager [phone]  06/26/2020, 4:50 PM

## 2020-07-15 NOTE — Consult Note (Signed)
[name] Nov 23, 1960  [id number].    Requesting MD: Dr. Dewaine Conger Chief Complaint/Reason for Consult: hemoperitoneum  HPI:  This is a 60 yo black female with a significant PMH including, ESRD on HD, HTN, rheumatic fever s/p aortic and mitral valve placements in 2020 on coumadin, INR >53, diastolic HF, wears O2 at night for "comfort:, and lupus who thought she was having constipation last Saturday.  She was taking a lot of dulcolax and other stool softeners.  She finally had a couple of small bowel movements.  She admits to some nausea, but no vomiting.  She does admit to some abdominal pain.  She went to HD on Monday, but skipped Wednesday as she did not feel well.  She states her pain worsened overnight and therefore presented to the ED today for evaluation.  Upon arrival, she was noted to be hypertensive and mildly tachycardic.  She underwent a CTA of the C/A/P with large volume hemoperitoneum of unclear source but prominent in the perihepatic space and the anterior pelvis.  No active extravasation is noted.  No other acute findings, but some old chronic findings were identified.  As the patient has been here her BP has dropped.  Her BP during my visit was in the 80s.  I asked for a 500cc bolus to be given while waiting for 1 unit of pRBCs to be given, given how much blood is present in her abdomen, she likely needs at least 1 unit. We have been asked to see her for further evaluation and recommendations.    ROS: ROS: Please see HPI, otherwise all other systems have been reviewed and are negative except for some chronic SOB, wears O2 at night, lower extremity numbness that's new, and malaise.  Family History  Problem Relation Age of Onset  . Liver cancer Maternal Grandmother   . Lymphoma Maternal Aunt   . Hypertension Mother   . Breast cancer Mother   . Renal Disease Father   . Hypertension Father   . Heart attack Neg Hx     Past Medical History:  Diagnosis Date  . Anemia    . Arthritis    "joints" (10/23/2017)  . Blood transfusion '08   [facility]; "low HgB" (10/23/2017)  . Chronic diastolic CHF (congestive heart failure) ([place])   . Claustrophobia   . Dysfunctional uterine bleeding 12/19/2010  . ESRD (end stage renal disease) on dialysis [facility])    "MWF; [name]." (10/23/2017)  . GERD (gastroesophageal reflux disease)   . Headache   . Hemodialysis patient [facility])    right extremity port  . History of hiatal hernia   . Hx of cardiovascular stress test    Lexiscan Myoview 4/16:  Normal stress nuclear study, EF 59%  . Hypertension   . Lupus ([place])    "? kind" (10/23/2017)  . Mitral stenosis    Echo 4/16:  EF 55-60%, no RWMA, Gr 1 DD, mod MS (mean 9 mmHg), mod LAE, mild RAE, PASP 65, mod to severe TR, trivial eff // Echo 6/19:  Mild LVH, EF 55-60, no RWMA, Gr 2 DD, mild to mod AS (Mean 20), severe MS (mean 17), massive LAE, PASP 48, trivial effusion   . Peptic ulcer disease   . Pneumonia 10/21/2017  . S/P aortic valve replacement with metallic valve 6/64/4034   21 mm Sorin [employer]omedics bileaflet mechanical valve  . S/P mitral valve replacement with metallic valve 7/42/5956   31 mm Sorin [employer]omedics Optiform bileaflet mechanical valve  .  Stroke [facility])    per patient "they said i had a small stroke but i couldnt even tell"  . Valvular heart disease (Aortic Stenosis and Mitral Stenosis) 09/25/2016   s/p mechanical AVR and MVR 11/2018 // Echo 02/2019: EF 65-70, mild LVH, mild LAE, normally functioning mechanical mitral valve prosthesis with no regurgitation or stenosis, trivial TR, normally functioning mechanical aortic valve prosthesis without regurgitation or stenosis     Past Surgical History:  Procedure Laterality Date  . AORTIC VALVE REPLACEMENT  12/02/2018   AORTIC VALVE REPLACEMENT (AVR) USING CARBOMEDICS SUPRA-ANNULAR TOP HAT SIZE 21MM (N/A)  . AORTIC VALVE REPLACEMENT N/A 12/02/2018   Procedure: AORTIC VALVE REPLACEMENT (AVR) USING CARBOMEDICS SUPRA-ANNULAR TOP HAT  SIZE 21MM;  Surgeon: Rexene Alberts, MD;  Location: [place];  Service: Open Heart Surgery;  Laterality: N/A;  . AV FISTULA PLACEMENT Right 02/25/2018   Procedure: INSERTION OF 18mm x 16cm ARTEGRAFT;  Surgeon: Waynetta Sandy, MD;  Location: [place];  Service: Vascular;  Laterality: Right;  . COLONOSCOPY W/ BIOPSIES AND POLYPECTOMY    . DIALYSIS FISTULA CREATION  2007  . ENDOMETRIAL ABLATION    . ESOPHAGOGASTRODUODENOSCOPY N/A 07/31/2014   Procedure: ESOPHAGOGASTRODUODENOSCOPY (EGD);  Surgeon: Clarene Essex, MD;  Location: [facility] ENDOSCOPY;  Service: Endoscopy;  Laterality: N/A;  . FISTULOGRAM Right 02/25/2018   Procedure: FISTULOGRAM ARM;  Surgeon: Waynetta Sandy, MD;  Location: [place];  Service: Vascular;  Laterality: Right;  . HEMATOMA EVACUATION Right 03/06/2018   Procedure: EVACUATION HEMATOMA RIGHT UPPER ARM;  Surgeon: Waynetta Sandy, MD;  Location: [place];  Service: Vascular;  Laterality: Right;  . INSERTION OF ARTERIOVENOUS (AV) ARTEGRAFT ARM Right 09/26/2017   Procedure: INSERTION OF ARTERIOVENOUS (AV) ARTEGRAFT INTO RIGHT ARM;  Surgeon: Waynetta Sandy, MD;  Location: [place];  Service: Vascular;  Laterality: Right;  . INSERTION OF DIALYSIS CATHETER Right 03/06/2018   Procedure: INSERTION OF DIALYSIS CATHETER, right internal jugular;  Surgeon: Waynetta Sandy, MD;  Location: [place];  Service: Vascular;  Laterality: Right;  . IR EMBO ART  VEN HEMORR Export  01/27/2020  . IR RENAL SELECTIVE  UNI INC S&I MOD SED  01/27/2020  . IR THORACENTESIS ASP PLEURAL SPACE W/IMG GUIDE  01/07/2019  . IR US GUIDE VASC ACCESS RIGHT  01/27/2020  . MITRAL VALVE REPLACEMENT N/A 12/02/2018   Procedure: MITRAL VALVE (MV) REPLACEMENT USING CARBOMEDICS OPTIFORM SIZE 31MM;  Surgeon: Rexene Alberts, MD;  Location: [place];  Service: Open Heart Surgery;  Laterality: N/A;  . REVISON OF ARTERIOVENOUS FISTULA Right 09/26/2017   Procedure: REVISION OF ARTERIOVENOUS  FISTULA RIGHT ARM WITH ARTEGRAFT;  Surgeon: Waynetta Sandy, MD;  Location: [place];  Service: Vascular;  Laterality: Right;  . REVISON OF ARTERIOVENOUS FISTULA Right 02/25/2018   Procedure: REVISION OF ARTERIOVENOUS FISTULA;  Surgeon: Waynetta Sandy, MD;  Location: [place];  Service: Vascular;  Laterality: Right;  . RIGHT/LEFT HEART CATH AND CORONARY ANGIOGRAPHY N/A 07/24/2018   Procedure: RIGHT/LEFT HEART CATH AND CORONARY ANGIOGRAPHY;  Surgeon: Larey Dresser, MD;  Location: [place] CV LAB;  Service: Cardiovascular;  Laterality: N/A;  . SHOULDER OPEN ROTATOR CUFF REPAIR Right 10/09/2016   Procedure: ROTATOR CUFF REPAIR SHOULDER OPEN partial acrominectomy and extensive synovectomy;  Surgeon: Latanya [name], MD;  Location: [facility];  Service: Orthopedics;  Laterality: Right;  RNFA  . TEE WITHOUT CARDIOVERSION N/A 01/02/2018   Procedure: TRANSESOPHAGEAL ECHOCARDIOGRAM (TEE) Bubble Study;  Surgeon: Larey Dresser, MD;  Location: [facility] ENDOS[facility];  Service: Cardiovascular;  Laterality: N/A;  . TEE WITHOUT CARDIOVERSION N/A 12/02/2018   Procedure: TRANSESOPHAGEAL ECHOCARDIOGRAM (TEE);  Surgeon: Rexene Alberts, MD;  Location: [place];  Service: Open Heart Surgery;  Laterality: N/A;  . TUBAL LIGATION      Social History:  reports that she quit smoking about 19 months ago. Her smoking use included cigarettes. She started smoking about 2 years ago. She has a 1.00 pack-year smoking history. She has never used smokeless tobacco. She reports current alcohol use of about 1.0 standard drink of alcohol per week. She reports previous drug use. Drugs: Marijuana and Cocaine.  Allergies: No Known Allergies  (Not in a hospital admission)    Physical Exam: Blood pressure (!) 76/48, pulse 100, temperature 97.7 F (36.5 C), temperature source Oral, resp. rate (!) 21, last menstrual period 12/05/2010, SpO2 95 %. General: chronically ill-appearing black female who is laying in bed with odd  affect. HEENT: head is normocephalic, atraumatic.  Sclera are noninjected.  PERRL.  Ears and nose without any masses or lesions.  Mouth is pink and dry. Heart: regular, rate, and rhythm.  Normal s1,s2. No obvious murmurs, gallops, or rubs noted, but valve noise heard.  Palpable left radial and B pedal pulses noted. Lungs: CTAB, no wheezes, rhonchi, or rales noted.  Respiratory effort nonlabored Abd: soft, diffusely tender, but no peritonitis or rebound, ND, +BS, no masses, hernias, or organomegaly MS: all 4 extremities are symmetrical with no cyanosis, clubbing, or edema. Except RUE with dialysis fistula noted with a good thrill present. Skin: warm and dry with no masses, lesions, or rashes Neuro: Cranial nerves 2-12 grossly intact, sensation is normal throughout.  She subjectively says her legs are numb, but she has normal sensation on exam. Psych: A&Ox3 but with an odd affect.  She would frequently stare into space and not engage in conversation but she would still follow commands and then very quickly act normal again.   Results for orders placed or performed during the hospital encounter of 06/26/2020 (from the past 48 hour(s))  CBC with Differential/Platelet     Status: Abnormal   Collection Time: 06/22/2020 10:50 AM  Result Value Ref Range   WBC 15.8 (H) 4.0 - 10.5 K/uL   RBC 2.70 (L) 3.87 - 5.11 MIL/uL   Hemoglobin 8.7 (L) 12.0 - 15.0 g/dL   HCT 27.8 (L) 36.0 - 46.0 %   MCV 103.0 (H) 80.0 - 100.0 fL   MCH 32.2 26.0 - 34.0 pg   MCHC 31.3 30.0 - 36.0 g/dL   RDW 17.3 (H) 11.5 - 15.5 %   Platelets 413 (H) 150 - 400 K/uL   nRBC 2.3 (H) 0.0 - 0.2 %   Neutrophils Relative % 75 %   Neutro Abs 11.8 (H) 1.7 - 7.7 K/uL   Lymphocytes Relative 9 %   Lymphs Abs 1.4 0.7 - 4.0 K/uL   Monocytes Relative 5 %   Monocytes Absolute 0.7 0.1 - 1.0 K/uL   Eosinophils Relative 1 %   Eosinophils Absolute 0.2 0.0 - 0.5 K/uL   Basophils Relative 0 %   Basophils Absolute 0.1 0.0 - 0.1 K/uL   Immature  Granulocytes 10 %   Abs Immature Granulocytes 1.64 (H) 0.00 - 0.07 K/uL   Polychromasia PRESENT     Comment: Performed at [facility], [address], [place], [place] [address]  Comprehensive metabolic panel     Status: Abnormal   Collection Time: 07/04/2020 10:50 AM  Result Value Ref Range   Sodium 132 (L) 135 - 145 mmol/L   Potassium 5.1 3.5 - 5.1 mmol/L   Chloride 88 (L) 98 - 111 mmol/L   CO2 20 (L) 22 - 32 mmol/L   Glucose, Bld 119 (H) 70 - 99 mg/dL    Comment: Glucose reference range applies only to samples taken after fasting for at least 8 hours.   BUN 85 (H) 6 - 20 mg/dL   Creatinine, Ser 9.37 (H) 0.44 - 1.00 mg/dL   Calcium 8.7 (L) 8.9 - 10.3 mg/dL   Total Protein 6.9 6.5 - 8.1 g/dL   Albumin 2.7 (L) 3.5 - 5.0 g/dL   AST 23 15 - 41 U/L   ALT 22 0 - 44 U/L   Alkaline Phosphatase 164 (H) 38 - 126 U/L   Total Bilirubin 0.8 0.3 - 1.2 mg/dL   GFR, Estimated 4 (L) >60 mL/min    Comment: (NOTE) Calculated using the CKD-EPI Creatinine Equation (2021)    Anion gap 24 (H) 5 - 15    Comment: Performed at [facility], [place] [address] [place], [place] [address]  Lipase, blood     Status: None   Collection Time: 06/30/2020 10:50 AM  Result Value Ref Range   Lipase 20 11 - 51 U/L    Comment: Performed at [place] [address], [place], [place] [address]  POC occult blood, ED     Status: Abnormal   Collection Time: 07/06/2020 11:04 AM  Result Value Ref Range   Fecal Occult Bld POSITIVE (A) NEGATIVE  Type and screen [place]     Status: None   Collection Time: 07/07/2020 11:31 AM  Result Value Ref Range   ABO/RH(D) A NEG    Antibody Screen NEG    Sample Expiration      08/06/20,2359 Performed at [facility], [place] [address], [place], [place] [address]   Resp Panel by RT-PCR (Flu A&B, Covid) Nasopharyngeal Swab     Status: None   Collection Time: 07/14/2020  1:15 PM   Specimen: Nasopharyngeal Swab; Nasopharyngeal(NP) swabs in vial  transport medium  Result Value Ref Range   SARS Coronavirus 2 by RT PCR NEGATIVE NEGATIVE    Comment: (NOTE) SARS-CoV-2 target nucleic acids are NOT DETECTED.  The SARS-CoV-2 RNA is generally detectable in upper respiratory specimens during the acute phase of infection. The lowest concentration of SARS-CoV-2 viral copies this assay can detect is 138 copies/mL. A negative result does not preclude SARS-Cov-2 infection and should not be used as the sole basis for treatment or other patient management decisions. A negative result may occur with  improper specimen collection/handling, submission of specimen other than nasopharyngeal swab, presence of viral mutation(s) within the areas targeted by this assay, and inadequate number of viral copies(<138 copies/mL). A negative result must be combined with clinical observations, patient history, and epidemiological information. The expected result is Negative.  Fact Sheet for Patients:  EntrepreneurPulse.com.au  Fact Sheet for Healthcare Providers:  IncredibleEmployment.be  This test is no t yet approved or cleared by the [place] FDA and  has been authorized for detection and/or diagnosis of SARS-CoV-2 by FDA under an Emergency Use Authorization (EUA). This EUA will remain  in effect (meaning this test can be used) for the duration of the COVID-19 declaration under Section 564(b)(1) of the Act, 21 U.S.C.section 360bbb-3(b)(1), unless the authorization is terminated  or revoked sooner.  Influenza A by PCR NEGATIVE NEGATIVE   Influenza B by PCR NEGATIVE NEGATIVE    Comment: (NOTE) The Xpert Xpress SARS-CoV-2/FLU/RSV plus assay is intended as an aid in the diagnosis of influenza from Nasopharyngeal swab specimens and should not be used as a sole basis for treatment. Nasal washings and aspirates are unacceptable for Xpert Xpress SARS-CoV-2/FLU/RSV testing.  Fact Sheet for  Patients: EntrepreneurPulse.com.au  Fact Sheet for Healthcare Providers: IncredibleEmployment.be  This test is not yet approved or cleared by the [place] FDA and has been authorized for detection and/or diagnosis of SARS-CoV-2 by FDA under an Emergency Use Authorization (EUA). This EUA will remain in effect (meaning this test can be used) for the duration of the COVID-19 declaration under Section 564(b)(1) of the Act, 21 U.S.C. section 360bbb-3(b)(1), unless the authorization is terminated or revoked.  Performed at [facility], [place] [address] [place], [place] [address]   Lactic acid, plasma     Status: Abnormal   Collection Time: 07/01/2020  2:12 PM  Result Value Ref Range   Lactic Acid, Venous 8.6 (HH) 0.5 - 1.9 mmol/L    Comment: CRITICAL RESULT CALLED TO, READ BACK BY AND VERIFIED WITH: OAKLEY,A RN @1441  ON [id number] BY FLEMINGS Performed at [facility], [place] [address], [place], [place] [address]   Protime-INR     Status: Abnormal   Collection Time: 06/27/2020  2:14 PM  Result Value Ref Range   Prothrombin Time >90.0 (H) 11.4 - 15.2 seconds   INR >10.0 (HH) 0.8 - 1.2    Comment: REPEATED TO VERIFY CRITICAL RESULT CALLED TO, READ BACK BY AND VERIFIED WITH: A OAKLEY RN [id number] BY B WYLIE (NOTE) INR goal varies based on device and disease states. Performed at [facility], [place] [address], [place], [place] [address]   APTT     Status: Abnormal   Collection Time: 07/16/2020  2:14 PM  Result Value Ref Range   aPTT 159 (H) 24 - 36 seconds    Comment:        IF BASELINE aPTT IS ELEVATED, SUGGEST PATIENT RISK ASSESSMENT BE USED TO DETERMINE APPROPRIATE ANTICOAGULANT THERAPY. Performed at [facility], [place] [address] [place], [place] [address]    CT Angio Chest/Abd/Pel for Dissection W and/or Wo Contrast  Result Date: 06/29/2020 CLINICAL DATA:  Severe lower abdominal pain and constipation for 2 days.  History of left retroperitoneal hemorrhage status post coil embolization of left renal arteries 01/27/2020. End-stage renal disease on hemodialysis. EXAM: CT ANGIOGRAPHY CHEST, ABDOMEN AND PELVIS TECHNIQUE: Non-contrast CT of the chest was initially obtained. Multidetector CT imaging through the chest, abdomen and pelvis was performed using the standard protocol during bolus administration of intravenous contrast. Multiplanar reconstructed images and MIPs were obtained and reviewed to evaluate the vascular anatomy. CONTRAST:  173mL OMNIPAQUE IOHEXOL 350 MG/ML SOLN COMPARISON:  01/26/2020 CT abdomen/pelvis. 10/30/2018 CT angiogram of the chest, abdomen and pelvis. FINDINGS: CTA CHEST FINDINGS Cardiovascular: Stable mild cardiomegaly. No significant pericardial effusion/thickening. Aortic and mitral valve prostheses in place. Left anterior descending coronary atherosclerosis. Atherosclerotic nonaneurysmal thoracic aorta. No acute intramural hematoma, dissection, pseudoaneurysm or penetrating atherosclerotic ulcer in the thoracic aorta. Top-normal caliber main pulmonary artery (3.3 cm diameter). No central pulmonary emboli. Mediastinum/Nodes: No discrete thyroid nodules. Mildly patulous thoracic esophagus with fluid level in mid to lower thoracic esophagus. No pathologically enlarged axillary, mediastinal or hilar lymph nodes. Lungs/Pleura: No pneumothorax. No pleural effusion. A few new scattered indistinct small right  pulmonary nodules, largest 4 mm in the peripheral apical right upper lobe (series 8/image 31). Mild-to-moderate platelike atelectasis in the mid to lower lungs bilaterally, right greater than left. No acute consolidative airspace disease or lung masses. Musculoskeletal: No aggressive appearing focal osseous lesions. Intact sternotomy wires. Generalized sclerosis in the thoracic osseous structures, similar, compatible with renal osteodystrophy. Review of the MIP images confirms the above findings. CTA  ABDOMEN AND PELVIS FINDINGS VASCULAR Aorta: Atherosclerotic nonaneurysmal abdominal aorta with no dissection or significant stenosis. Celiac: Atherosclerotic without evidence of aneurysm, dissection, vasculitis or significant stenosis. SMA: Atherosclerotic without evidence of aneurysm, dissection or vasculitis. There is irregular multifocal moderate to high-grade stenosis throughout the entire SMA. Renals: Coil embolization of left renal artery near the left renal sinus. Irregular high-grade stenosis of the proximal right renal artery. No pseudoaneurysm. IMA: Patent without evidence of aneurysm, dissection, vasculitis or significant stenosis. Inflow: Patent without evidence of aneurysm, dissection, vasculitis or significant stenosis. Veins: No obvious venous abnormality within the limitations of this arterial phase study. Review of the MIP images confirms the above findings. NON-VASCULAR Hepatobiliary: Normal liver size. Scattered subcentimeter hypodense liver lesions are too small to characterize and are unchanged, presumably benign. No new liver lesions. Normal gallbladder with no radiopaque cholelithiasis. No biliary ductal dilatation. Pancreas: Normal, with no mass or duct dilation. Spleen: Atrophic calcified spleen, unchanged.  No splenic masses. Adrenals/Urinary Tract: Normal adrenals. Symmetric severe atrophy of kidneys bilaterally. No hydronephrosis. Numerous small simple bilateral renal cysts, largest 1.7 cm in the posterior lower right kidney. Hyperdense subcentimeter posterior lower left renal cortical lesion (series 6/image 96), requiring no follow-up. Bladder is completely collapsed and grossly normal. Stomach/Bowel: Moderate hiatal hernia. Otherwise normal nondistended stomach. Normal caliber small bowel with no small bowel wall thickening. Normal appendix. Moderate diffuse colonic diverticulosis with no large bowel wall thickening or significant pericolonic fat stranding. Vascular/Lymphatic: No  pathologically enlarged lymph nodes in the abdomen or pelvis. Reproductive: Mildly enlarged myomatous uterus with coarsely calcified degenerated uterine fibroids, largest 2.3 cm, unchanged. No discrete adnexal masses. Other: No pneumoperitoneum. New large volume hemoperitoneum, most prominent in the perihepatic space and anterior pelvic peritoneum surrounding the uterus and superior to the bladder. No evidence of active contrast extravasation. Musculoskeletal: No aggressive appearing focal osseous lesions. Diffuse sclerosis throughout abdominopelvic skeleton, similar, compatible with renal osteodystrophy. Mild lumbar spondylosis. Review of the MIP images confirms the above findings. IMPRESSION: 1. New large volume hemoperitoneum of unclear source, most prominent in the perihepatic space and anterior pelvic peritoneum surrounding the uterus and superior to the bladder. No evidence of active contrast extravasation. No acute aortic syndrome. 2. Atherosclerotic stenoses throughout the SMA and proximal right renal artery as detailed. Coil embolization of left renal artery near the left renal sinus. No pseudoaneurysm identified. 3. Moderate hiatal hernia. Mildly patulous thoracic esophagus with fluid level, suggesting esophageal dysmotility and/or gastroesophageal reflux. 4. A few new scattered indistinct small right pulmonary nodules, largest 4 mm, probably inflammatory. Recommend attention on follow-up chest CT in 3-6 months. 5. Moderate diffuse colonic diverticulosis. 6. Renal osteodystrophy. 7. Mildly enlarged myomatous uterus. 8. Aortic Atherosclerosis (ICD10-I70.0). Critical Value/emergent results were called by telephone at the time of interpretation on 06/23/2020 at 2:06 pm to provider [facility], who verbally acknowledged these results. Electronically Signed   By: Ilona Sorrel M.D.   On: 07/05/2020 14:09      Assessment/Plan ESRD on HD S/p aortic/mitral valve replacements on coumadin, supratherapeutic  INR >10 - currently getting FFP/Vit K together.  Defer to  medical service HTN O2 needs at night "for comfort" Lupus H/O CVA Acute on chronic Anemia - transfuse at least 1 unit currently secondary to acute hypotension  Hemoperitoneum secondary to spontaneous bleed from supratherapeutic INR The patient has been found to have a supratherapeutic INR of >10 with hemoperitoneum but no active extravasation.  The patient was hypotensive during my visit and I recommended 1 unit of pRBCs given the amount of blood in her abdomen and her low BP.  She also got a 500cc bolus while awaiting blood product and did respond to this.  The patient needs to be resuscitated appropriately and her INR corrected.  As long as the patient remains hemodynamically stable, observation is all she will need.  If she becomes unstable again with concern for further bleeding, IR would need to be called to assess for angiogram and possible embolization if location found.  Operative intervention would be a last resort in this situation.  We will continue to follow the patient, but no acute needs.   FEN - NPO VTE - on hold ID - no acute abx warranted from surgical standpoint   Henreitta Cea, [facility] 07/01/2020, 3:21 PM Please see Amion for pager number during day hours 7:00am-4:30pm or 7:00am -11:30am on weekends

## 2020-07-15 NOTE — ED Notes (Signed)
Verbal order for dextrose 50 iv

## 2020-07-15 NOTE — ED Notes (Signed)
A and o x4

## 2020-07-15 NOTE — ED Notes (Signed)
Pain med given 5 mionutes ago

## 2020-07-15 NOTE — ED Notes (Signed)
The pt is on the bare hugger and will not stay covered with it   Temp is coming up

## 2020-07-15 NOTE — Consult Note (Addendum)
[facility] Renal Consultation Note    Indication for Consultation:  Management of ESRD/hemodialysis; anemia, hypertension/volume and secondary hyperparathyroidism  [id number], Iona Beard, MD  HPI: [name] is a 60 y.o. 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 On HD MWF at [facility], first starting on Jan 2007.  Her past medical history significant for severe mitral and aortic stenosis; requiring mechanical valves (on coumadin), subarachnoid bleed (2007), HTN, GERD, drug abuse, and diastolic heart failure (EF 65-70% 02/2019). Patient presents to the ED c/o abdominal pain, heartburn, and constipation. She says the abdominal started 2 days ago and has worsened, prompting her to go the ER for further evaluation. Her last HD treatment was Monday 07/11/20. Patient reports missing HD on 07/13/20 d/t not feeling well. In Sept 2021, patient with history of retroperitoneal bleed where she underwent L renal angio/embolization. Patient seen and examined in ER. She reports not feeling well. Denies bloody stools, SOB, CP, and vomiting. Consulted by General Surgery. According to previous notes, patient noted sys BP 70s. 500cc NS bolus given. PAN CT showed large hemoperitoneum of unclear source. 1 unit PRBCs also ordered. Other current labs include INR > 10 and APTT 159. Plan is for patient to be admitted to medical service and HD order placed for tomorrow morning.  Past Medical History:  Diagnosis Date  . Anemia   . Arthritis    "joints" (10/23/2017)  . Blood transfusion '08   [facility]; "low HgB" (10/23/2017)  . Chronic diastolic CHF (congestive heart failure) ([place])   . Claustrophobia   . Dysfunctional uterine bleeding 12/19/2010  . ESRD (end stage renal disease) on dialysis [facility])    "MWF; [name]." (10/23/2017)  . GERD (gastroesophageal reflux disease)   . Headache   . Hemodialysis patient [facility])    right extremity port  . History of hiatal hernia   . Hx of cardiovascular stress  test    Lexiscan Myoview 4/16:  Normal stress nuclear study, EF 59%  . Hypertension   . Lupus ([place])    "? kind" (10/23/2017)  . Mitral stenosis    Echo 4/16:  EF 55-60%, no RWMA, Gr 1 DD, mod MS (mean 9 mmHg), mod LAE, mild RAE, PASP 65, mod to severe TR, trivial eff // Echo 6/19:  Mild LVH, EF 55-60, no RWMA, Gr 2 DD, mild to mod AS (Mean 20), severe MS (mean 17), massive LAE, PASP 48, trivial effusion   . Peptic ulcer disease   . Pneumonia 10/21/2017  . S/P aortic valve replacement with metallic valve 07/16/3333   21 mm Sorin Carbomedics bileaflet mechanical valve  . S/P mitral valve replacement with metallic valve 4/56/2563   31 mm Sorin Carbomedics Optiform bileaflet mechanical valve  . Stroke [facility])    per patient "they said i had a small stroke but i couldnt even tell"  . Valvular heart disease (Aortic Stenosis and Mitral Stenosis) 09/25/2016   s/p mechanical AVR and MVR 11/2018 // Echo 02/2019: EF 65-70, mild LVH, mild LAE, normally functioning mechanical mitral valve prosthesis with no regurgitation or stenosis, trivial TR, normally functioning mechanical aortic valve prosthesis without regurgitation or stenosis    Past Surgical History:  Procedure Laterality Date  . AORTIC VALVE REPLACEMENT  12/02/2018   AORTIC VALVE REPLACEMENT (AVR) USING CARBOMEDICS SUPRA-ANNULAR TOP HAT SIZE 21MM (N/A)  . AORTIC VALVE REPLACEMENT N/A 12/02/2018   Procedure: AORTIC VALVE REPLACEMENT (AVR) USING CARBOMEDICS SUPRA-ANNULAR TOP HAT SIZE 21MM;  Surgeon: Rexene Alberts, MD;  Location: [place];  Service: Open Heart Surgery;  Laterality: N/A;  . AV FISTULA PLACEMENT Right 02/25/2018   Procedure: INSERTION OF 68mm x 16cm ARTEGRAFT;  Surgeon: Waynetta Sandy, MD;  Location: [place];  Service: Vascular;  Laterality: Right;  . COLONOSCOPY W/ BIOPSIES AND POLYPECTOMY    . DIALYSIS FISTULA CREATION  2007  . ENDOMETRIAL ABLATION    . ESOPHAGOGASTRODUODENOSCOPY N/A 07/31/2014   Procedure:  ESOPHAGOGASTRODUODENOSCOPY (EGD);  Surgeon: Clarene Essex, MD;  Location: [facility] ENDOSCOPY;  Service: Endoscopy;  Laterality: N/A;  . FISTULOGRAM Right 02/25/2018   Procedure: FISTULOGRAM ARM;  Surgeon: Waynetta Sandy, MD;  Location: [place];  Service: Vascular;  Laterality: Right;  . HEMATOMA EVACUATION Right 03/06/2018   Procedure: EVACUATION HEMATOMA RIGHT UPPER ARM;  Surgeon: Waynetta Sandy, MD;  Location: [place];  Service: Vascular;  Laterality: Right;  . INSERTION OF ARTERIOVENOUS (AV) ARTEGRAFT ARM Right 09/26/2017   Procedure: INSERTION OF ARTERIOVENOUS (AV) ARTEGRAFT INTO RIGHT ARM;  Surgeon: Waynetta Sandy, MD;  Location: [place];  Service: Vascular;  Laterality: Right;  . INSERTION OF DIALYSIS CATHETER Right 03/06/2018   Procedure: INSERTION OF DIALYSIS CATHETER, right internal jugular;  Surgeon: Waynetta Sandy, MD;  Location: [place];  Service: Vascular;  Laterality: Right;  . IR EMBO ART  VEN HEMORR [place]  01/27/2020  . IR RENAL SELECTIVE  UNI INC S&I MOD SED  01/27/2020  . IR THORACENTESIS ASP PLEURAL SPACE W/IMG GUIDE  01/07/2019  . IR US GUIDE VASC ACCESS RIGHT  01/27/2020  . MITRAL VALVE REPLACEMENT N/A 12/02/2018   Procedure: MITRAL VALVE (MV) REPLACEMENT USING CARBOMEDICS OPTIFORM SIZE 31MM;  Surgeon: Rexene Alberts, MD;  Location: [place];  Service: Open Heart Surgery;  Laterality: N/A;  . REVISON OF ARTERIOVENOUS FISTULA Right 09/26/2017   Procedure: REVISION OF ARTERIOVENOUS FISTULA RIGHT ARM WITH ARTEGRAFT;  Surgeon: Waynetta Sandy, MD;  Location: [place];  Service: Vascular;  Laterality: Right;  . REVISON OF ARTERIOVENOUS FISTULA Right 02/25/2018   Procedure: REVISION OF ARTERIOVENOUS FISTULA;  Surgeon: Waynetta Sandy, MD;  Location: [place];  Service: Vascular;  Laterality: Right;  . RIGHT/LEFT HEART CATH AND CORONARY ANGIOGRAPHY N/A 07/24/2018   Procedure: RIGHT/LEFT HEART CATH AND CORONARY ANGIOGRAPHY;   Surgeon: Larey Dresser, MD;  Location: [place] CV LAB;  Service: Cardiovascular;  Laterality: N/A;  . SHOULDER OPEN ROTATOR CUFF REPAIR Right 10/09/2016   Procedure: ROTATOR CUFF REPAIR SHOULDER OPEN partial acrominectomy and extensive synovectomy;  Surgeon: Latanya [name], MD;  Location: [facility];  Service: Orthopedics;  Laterality: Right;  RNFA  . TEE WITHOUT CARDIOVERSION N/A 01/02/2018   Procedure: TRANSESOPHAGEAL ECHOCARDIOGRAM (TEE) Bubble Study;  Surgeon: Larey Dresser, MD;  Location[facility] ENDOSCOPY;  Service: Cardiovascular;  Laterality: N/A;  . TEE WITHOUT CARDIOVERSION N/A 12/02/2018   Procedure: TRANSESOPHAGEAL ECHOCARDIOGRAM (TEE);  Surgeon: Rexene Alberts, MD;  Location: [place];  Service: Open Heart Surgery;  Laterality: N/A;  . TUBAL LIGATION     Family History  Problem Relation Age of Onset  . Liver cancer Maternal Grandmother   . Lymphoma Maternal Aunt   . Hypertension Mother   . Breast cancer Mother   . Renal Disease Father   . Hypertension Father   . Heart attack Neg Hx    Social History:  reports that she quit smoking about 19 months ago. Her smoking use included cigarettes. She started smoking about 2 years ago. She has a 1.00 pack-year smoking history. She has never  used smokeless tobacco. She reports current alcohol use of about 1.0 standard drink of alcohol per week. She reports previous drug use. Drugs: Marijuana and Cocaine. No Known Allergies Prior to Admission medications   Medication Sig Start Date End Date Taking? Authorizing Provider  amoxicillin (AMOXIL) 500 MG capsule Take 4 capsules (2,000 mg total) by mouth once as needed (1 hour prior to any dental appointment/procedure including cleaning). 05/18/20   Loel Dubonnet, NP  AURYXIA 1 GM 210 MG(Fe) tablet Take 840 mg by mouth 3 (three) times daily. 12/04/19   [provider]  bisacodyl (DULCOLAX) 5 MG EC tablet Take 5 mg by mouth daily as needed for moderate constipation.    [provider]  calcitRIOL (ROCALTROL) 0.5 MCG capsule Take 4 capsules (2 mcg total) by mouth every Monday, Wednesday, and Friday. 02/12/20   Pokhrel, Corrie Mckusick, MD  cinacalcet (SENSIPAR) 30 MG tablet Take 4 tablets (120 mg total) by mouth every Monday, Wednesday, and Friday with hemodialysis. 02/12/20   Pokhrel, Corrie Mckusick, MD  Methoxy PEG-Epoetin Beta (MIRCERA IJ) Done at dialysis 02/17/20 02/15/21  [provider]  metoprolol tartrate (LOPRESSOR) 25 MG tablet Take 1 tablet (25 mg total) by mouth 2 (two) times daily. 02/12/20   Pokhrel, Corrie Mckusick, MD  multivitamin (RENA-VIT) TABS tablet Take 1 tablet by mouth at bedtime.     [provider]  ondansetron (ZOFRAN) 4 MG tablet Take 4 mg by mouth every 6 (six) hours as needed for nausea or vomiting.    [provider]  pantoprazole (PROTONIX) 40 MG tablet Take 1 tablet (40 mg total) by mouth daily. 02/12/20   Pokhrel, Corrie Mckusick, MD  warfarin (COUMADIN) 3 MG tablet Take 2-3 tablets by mouth daily as instructed by the coumadin clinic. 07/04/20   Fay Records, MD   Current Facility-Administered Medications  Medication Dose Route Frequency Provider Last Rate Last Admin  . [START ON 07/16/2020] Chlorhexidine Gluconate Cloth 2 % PADS 6 each  6 each Topical Q0600 Penninger, Lindsay, PA      . desmopressin (DDAVP) 20 mcg in sodium chloride 0.9 % 50 mL IVPB  20 mcg Intravenous Once Hayden Pedro M, NP      . phytonadione (VITAMIN K) tablet 10 mg  10 mg Oral Once [place], Wylder S, [place]       Current Outpatient Medications  Medication Sig Dispense Refill  . amoxicillin (AMOXIL) 500 MG capsule Take 4 capsules (2,000 mg total) by mouth once as needed (1 hour prior to any dental appointment/procedure including cleaning). 8 capsule 4  . AURYXIA 1 GM 210 MG(Fe) tablet Take 840 mg by mouth 3 (three) times daily.    . bisacodyl (DULCOLAX) 5 MG EC tablet Take 5 mg by mouth daily as needed for moderate constipation.    . calcitRIOL (ROCALTROL) 0.5 MCG  capsule Take 4 capsules (2 mcg total) by mouth every Monday, Wednesday, and Friday. 60 capsule 0  . cinacalcet (SENSIPAR) 30 MG tablet Take 4 tablets (120 mg total) by mouth every Monday, Wednesday, and Friday with hemodialysis. 60 tablet 0  . Methoxy PEG-Epoetin Beta (MIRCERA IJ) Done at dialysis    . metoprolol tartrate (LOPRESSOR) 25 MG tablet Take 1 tablet (25 mg total) by mouth 2 (two) times daily. 60 tablet 2  . multivitamin (RENA-VIT) TABS tablet Take 1 tablet by mouth at bedtime.     . ondansetron (ZOFRAN) 4 MG tablet Take 4 mg by mouth every 6 (six) hours as needed for nausea or vomiting.    [name]  pantoprazole (PROTONIX) 40 MG tablet Take 1 tablet (40 mg total) by mouth daily.    [name] warfarin (COUMADIN) 3 MG tablet Take 2-3 tablets by mouth daily as instructed by the coumadin clinic. 250 tablet 0   Labs: Basic Metabolic Panel: Recent Labs  Lab 07/17/2020 1050  NA 132*  K 5.1  CL 88*  CO2 20*  GLUCOSE 119*  BUN 85*  CREATININE 9.37*  CALCIUM 8.7*   Liver Function Tests: Recent Labs  Lab 06/28/2020 1050  AST 23  ALT 22  ALKPHOS 164*  BILITOT 0.8  PROT 6.9  ALBUMIN 2.7*   Recent Labs  Lab 07/05/2020 1050  LIPASE 20   No results for input(s): AMMONIA in the last 168 hours. CBC: Recent Labs  Lab 07/13/2020 1050  WBC 15.8*  NEUTROABS 11.8*  HGB 8.7*  HCT 27.8*  MCV 103.0*  PLT 413*   Cardiac Enzymes: No results for input(s): CKTOTAL, CKMB, CKMBINDEX, TROPONINI in the last 168 hours. CBG: Recent Labs  Lab 06/25/2020 1612  GLUCAP 24*   Iron Studies: No results for input(s): IRON, TIBC, TRANSFERRIN, FERRITIN in the last 72 hours. Studies/Results: CT Angio Chest/Abd/Pel for Dissection W and/or Wo Contrast  Result Date: 07/17/2020 CLINICAL DATA:  Severe lower abdominal pain and constipation for 2 days. History of left retroperitoneal hemorrhage status post coil embolization of left renal arteries 01/27/2020. End-stage renal disease on hemodialysis. EXAM: CT ANGIOGRAPHY  CHEST, ABDOMEN AND PELVIS TECHNIQUE: Non-contrast CT of the chest was initially obtained. Multidetector CT imaging through the chest, abdomen and pelvis was performed using the standard protocol during bolus administration of intravenous contrast. Multiplanar reconstructed images and MIPs were obtained and reviewed to evaluate the vascular anatomy. CONTRAST:  122mL OMNIPAQUE IOHEXOL 350 MG/ML SOLN COMPARISON:  01/26/2020 CT abdomen/pelvis. 10/30/2018 CT angiogram of the chest, abdomen and pelvis. FINDINGS: CTA CHEST FINDINGS Cardiovascular: Stable mild cardiomegaly. No significant pericardial effusion/thickening. Aortic and mitral valve prostheses in place. Left anterior descending coronary atherosclerosis. Atherosclerotic nonaneurysmal thoracic aorta. No acute intramural hematoma, dissection, pseudoaneurysm or penetrating atherosclerotic ulcer in the thoracic aorta. Top-normal caliber main pulmonary artery (3.3 cm diameter). No central pulmonary emboli. Mediastinum/Nodes: No discrete thyroid nodules. Mildly patulous thoracic esophagus with fluid level in mid to lower thoracic esophagus. No pathologically enlarged axillary, mediastinal or hilar lymph nodes. Lungs/Pleura: No pneumothorax. No pleural effusion. A few new scattered indistinct small right pulmonary nodules, largest 4 mm in the peripheral apical right upper lobe (series 8/image 31). Mild-to-moderate platelike atelectasis in the mid to lower lungs bilaterally, right greater than left. No acute consolidative airspace disease or lung masses. Musculoskeletal: No aggressive appearing focal osseous lesions. Intact sternotomy wires. Generalized sclerosis in the thoracic osseous structures, similar, compatible with renal osteodystrophy. Review of the MIP images confirms the above findings. CTA ABDOMEN AND PELVIS FINDINGS VASCULAR Aorta: Atherosclerotic nonaneurysmal abdominal aorta with no dissection or significant stenosis. Celiac: Atherosclerotic without  evidence of aneurysm, dissection, vasculitis or significant stenosis. SMA: Atherosclerotic without evidence of aneurysm, dissection or vasculitis. There is irregular multifocal moderate to high-grade stenosis throughout the entire SMA. Renals: Coil embolization of left renal artery near the left renal sinus. Irregular high-grade stenosis of the proximal right renal artery. No pseudoaneurysm. IMA: Patent without evidence of aneurysm, dissection, vasculitis or significant stenosis. Inflow: Patent without evidence of aneurysm, dissection, vasculitis or significant stenosis. Veins: No obvious venous abnormality within the limitations of this arterial phase study. Review of the MIP images confirms the above findings. NON-VASCULAR Hepatobiliary: Normal liver size. Scattered subcentimeter  hypodense liver lesions are too small to characterize and are unchanged, presumably benign. No new liver lesions. Normal gallbladder with no radiopaque cholelithiasis. No biliary ductal dilatation. Pancreas: Normal, with no mass or duct dilation. Spleen: Atrophic calcified spleen, unchanged.  No splenic masses. Adrenals/Urinary Tract: Normal adrenals. Symmetric severe atrophy of kidneys bilaterally. No hydronephrosis. Numerous small simple bilateral renal cysts, largest 1.7 cm in the posterior lower right kidney. Hyperdense subcentimeter posterior lower left renal cortical lesion (series 6/image 96), requiring no follow-up. Bladder is completely collapsed and grossly normal. Stomach/Bowel: Moderate hiatal hernia. Otherwise normal nondistended stomach. Normal caliber small bowel with no small bowel wall thickening. Normal appendix. Moderate diffuse colonic diverticulosis with no large bowel wall thickening or significant pericolonic fat stranding. Vascular/Lymphatic: No pathologically enlarged lymph nodes in the abdomen or pelvis. Reproductive: Mildly enlarged myomatous uterus with coarsely calcified degenerated uterine fibroids, largest  2.3 cm, unchanged. No discrete adnexal masses. Other: No pneumoperitoneum. New large volume hemoperitoneum, most prominent in the perihepatic space and anterior pelvic peritoneum surrounding the uterus and superior to the bladder. No evidence of active contrast extravasation. Musculoskeletal: No aggressive appearing focal osseous lesions. Diffuse sclerosis throughout abdominopelvic skeleton, similar, compatible with renal osteodystrophy. Mild lumbar spondylosis. Review of the MIP images confirms the above findings. IMPRESSION: 1. New large volume hemoperitoneum of unclear source, most prominent in the perihepatic space and anterior pelvic peritoneum surrounding the uterus and superior to the bladder. No evidence of active contrast extravasation. No acute aortic syndrome. 2. Atherosclerotic stenoses throughout the SMA and proximal right renal artery as detailed. Coil embolization of left renal artery near the left renal sinus. No pseudoaneurysm identified. 3. Moderate hiatal hernia. Mildly patulous thoracic esophagus with fluid level, suggesting esophageal dysmotility and/or gastroesophageal reflux. 4. A few new scattered indistinct small right pulmonary nodules, largest 4 mm, probably inflammatory. Recommend attention on follow-up chest CT in 3-6 months. 5. Moderate diffuse colonic diverticulosis. 6. Renal osteodystrophy. 7. Mildly enlarged myomatous uterus. 8. Aortic Atherosclerosis (ICD10-I70.0). Critical Value/emergent results were called by telephone at the time of interpretation on 07/01/2020 at 2:06 pm to provider [facility], who verbally acknowledged these results. Electronically Signed   By: Ilona Sorrel M.D.   On: 06/25/2020 14:09    ROS:  General: No weight loss, fever, chills  HEENT: No recent headaches or visual changes Neurologic: No dizziness Cardiac: No recent episodes of chest pain/pressure,  shortness of breath at rest or DOE.  Pulmonary: No home oxygen,no cough, hemoptysis, or wheezing   Hematologic: Patient on coumadin for mechanical valves  Gastrointestinal: c/o abdominal pain, heartburn, and constipation; No hematochezia or melena  Skin: No rashes or lesions  Physical Exam: Vitals:   06/28/2020 1445 07/05/2020 1500 07/16/2020 1533 07/13/2020 1600  BP: (!) 68/37 (!) 76/48 112/78 103/63  Pulse:   91   Resp: (!) 22 (!) 21 16 18   Temp:      TempSrc:      SpO2:   100%      General: WDWN NAD Head: NCAT sclera not icteric MMM Lungs: CTA bilaterally. No wheeze, rales or rhonchi. Breathing is unlabored. Heart: RRR. No murmur, rubs or gallops.  Abdomen: soft, tender to palpation, +BS Lower extremities: no edema bilateral hips and lower extremities Neuro: AAOx3. Moves all extremities spontaneously. Dialysis Access: R AVF  Dialysis Orders:  MWF - [facility]  3.75hrs, BFR 450, DFR 500,  [name] 59.5kg, 2K/ 2Ca  Access: R AVF  NO HEPARIN Mircera 60 mcg q2wks - last 07/08/20 Sensipar 180mg   with HD treatments- last 07/11/20 Anuryxia 210mg  3 tablets TID with meals  Last Labs: Admission labs pending.  TSAT 44 (07/06/20 OP HD), P 6.2 (07/01/20 OP HD) , PTH 301 (07/01/20 OP HD)  Assessment/Plan: 1. Large Volume Hemoperitoneum- Unclear etiology. CCS consulted. Patient currently stable. If becomes unstable, IR to be consulted for possible angiogram. VTE PPX on hold for now 2. Coagulopathy- INR > 10. Vitamin K ordered. Plan for FFP and 32mcg DDAVP. Monitor INR and LFTs 3.  ESRD -  MWF. Missed 07/13/20 treatment due to not feeling well.  HD scheduled for tomorrow morning 07/16/20. 4.  Hypertension/volume  - Blood pressures low. Received NS bolus. No evidence of volume overload at this time. 5.  Anemia of CKD - Acute on chronic blood loss. Plan for FFP and PRBC. CBC pending. Last Hgb 9.3 and Tsat 44 (outpatient HD center). Last Micera 57mcg (every 2 weeks) dose given on 07/08/20. Will resume ESA to begin 07/22/20.  6. Secondary Hyperparathyroidism - Last PO4 6.2 (OP HD). Will continue  Sensipar and Anuryxia 7. Nutrition - Advanced as tolerated to renal diet when clinically stable.  Tobie Poet, NP [facility] 07/05/2020, 4:42 PM

## 2020-07-15 NOTE — Consult Note (Signed)
NAME:  [name], MRN:  [id number], DOB:  [birth year], LOS: 0 ADMISSION DATE:  07/01/2020, CONSULTATION DATE:  06/24/2020 REFERRING MD:  Dr. Ron Parker, CHIEF COMPLAINT: Nausea, Constipation, Lower ABD Pain  Brief History:  61 yo F with ESRD, mechanical aortic and mitral valves, here with abdominal pain -- intra abdominal bleed (no active extrav) and INR >10  History of Present Illness:  60 y/o F PMH ESRD, mechanical Aortic and mitral valve replacement, who presented to [facility] on 2/25 with reports of lower abdominal pain, nausea, constipation.    The patient reports she has been constipated since 2/19 and has been using laxatives and stool softeners.  She ultimately had a few small bowel movements. She has been nauseated but no vomiting and has had lower abdominal pain.  She is known ESRD on HD (M/W/F) and went to HD on 2/21 but skipped 2/23 as she did not feel well. Her pain increased overnight into 2/25 which prompted her to seek ER evaluation.  Of note, she was admitted in 01/2020 for retroperitoneal hematoma / bleed s/p FFP, PRBC and renal embolization.    Initial ER evaluation notable for LA 8.6, INR > 10. 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   Micro Data:  COVID 2/25 >> negative  Influenza A/B 2/25 >> negative   Antimicrobials:    Interim History / Subjective:  SBPs high 90s. Mentating well.  C/o RLQ abdominal  pain  Objective   Blood pressure 112/78, pulse 91, temperature 97.7 F (36.5 C), temperature source Oral, resp. rate 16, last menstrual period 12/05/2010, SpO2 100 %.       No intake or output data in the 24 hours ending 07/17/2020 1534 There were no vitals filed for this visit.  Examination: General:  Chronically and acutely ill appearing middle aged F, L side lying in obvious discomfort but NAD HEENT: NCAT poor dentition. Pink mm.  Neuro: Lethargic, oriented x 3.   CV: rrr. Mechanical valve   PULM:  CTAb, symmetrical,  GI: soft, round, non-distended. Non-specific tenderness slightly increased over RLQ, no rebound tenderness, hypoactive bowel sounds  Extremities: No acute joint deformity. Fistula. No cyanosis or clubbing  Skin: c/d/ cool to touch, no rash    Resolved Hospital Problem list     Assessment & Plan:   Large volume hemoperitoneum  -no evidence of active extrav on CT, unclear etiology. Greater prominence in perihepatic space and anterior pelvis -suspect this is source of abd pain P  -case discussed with [facility] -if patient decompensates, needs IR consult to assess for possible angiogram -CCS has been consulted, but without a known source of bleed, suspect ex lap would really be a last resort in instance of decompensation  -hold chemical vte ppx  Coagulopathy, severe -coumadin at home for aortic and mitral valve.  Last check was about 2 weeks ago, INR was 3.7, pt reportedly instructed to decrease coumadin to  2x/week which she states she is compliant -LFTs are pretty normal, Alk phos is mildly elevated at 164. Pt denies heavy etoh use  -- doesn't seem like this is driven by hepatic dysfunction -2.5 mg Vit K ordered in ED but not yet given  P -10mg Vit K, 2 FFP, 20mcg DDAVP -trend INR -trend LFTs   -repeat CMP ordered 2/25 afternoon  Acute on chronic anemia (chronic in setting of ESRD, acute component in setting of large volume hemoperitoneum) P - getting 1 PRBC in  ED due to volume of blood in abdomen  - trend CBC  -- repeat ordered 2/25 afternoon  ESRD  -missed HD this week P -neprho consult  S/p Aortic / Mitral Valve Replacement  On coumadin  P -hold at present in setting of severe coagulopathy  -trend INR   Lactic acidosis AGMA  -suspect in setting of hemoperitoneum  -trend   Leukocytosis -? Reactive vs infectious P -trend WBC, fever curve   Hx HTN -on metop at home P -hold at present   Polysubstance use -uses marijuana regularly, sometimes cocaine P -recommend cessation counseling    Thank you for consulting PCCM. At this time the patient is hemodynamically stable and stable from respiratory status. We will sign off. Please re-engage if the patient's clinical status changes or if we can be of further assistance   Best practice (evaluated daily)  Diet: NPO Pain/Anxiety/Delirium protocol (if indicated): na VAP protocol (if indicated): na DVT prophylaxis: hold chemical vte ppx  GI prophylaxis: per primary -- takes protonix at home  Glucose control: monitor Mobility: br Disposition: At this time, stable for admission outside of ICU to SDU   Goals of Care:  Last date of multidisciplinary goals of care discussion: per primary  Family and staff present: -- Summary of discussion: -- Follow up goals of care discussion due: -- Code Status: full  Labs   CBC: Recent Labs  Lab 07/14/2020 1050  WBC 15.8*  NEUTROABS 11.8*  HGB 8.7*  HCT 27.8*  MCV 103.0*  PLT 413*    Basic Metabolic Panel: Recent Labs  Lab 07/12/2020 1050  NA 132*  K 5.1  CL 88*  CO2 20*  GLUCOSE 119*  BUN 85*  CREATININE 9.37*  CALCIUM 8.7*   GFR: CrCl cannot be calculated (Unknown ideal weight.). Recent Labs  Lab 07/04/2020 1050 06/26/2020 1412  WBC 15.8*  --   LATICACIDVEN  --  8.6*    Liver Function Tests: Recent Labs  Lab 07/08/2020 1050  AST 23  ALT 22  ALKPHOS 164*  BILITOT 0.8  PROT 6.9  ALBUMIN 2.7*   Recent Labs  Lab  07/06/2020 1050  LIPASE 20   No results for input(s): AMMONIA in the last 168 hours.  ABG    Component Value Date/Time   PHART 7.264 (L) 12/02/2018 1829   PCO2ART 56.3 (H) 12/02/2018 1829   PO2ART 86.0 12/02/2018 1829   HCO3 25.8 12/02/2018 1829   TCO2 28 12/02/2018 1829   ACIDBASEDEF 2.0 12/02/2018 1829   O2SAT 95.0 12/02/2018 1829     Coagulation Profile: Recent Labs  Lab 07/12/2020 1414  INR >10.0*    Cardiac Enzymes: No results for input(s): CKTOTAL, CKMB, CKMBINDEX, TROPONINI in the last 168 hours.  HbA1C: Hgb A1c MFr Bld  Date/Time Value Ref Range Status  11/27/2018 12:25 PM 4.9 4.8 - 5.6 % Final    Comment:    (NOTE)         Prediabetes: 5.7 - 6.4           Diabetes: >6.4         Glycemic control for adults with diabetes: <7.0     CBG: No results for input(s): GLUCAP in the last 168 hours.  Review of Systems: Positives in Bold   Gen: Endorses fatigue. Denies fever, chills, weight change,  night sweats HEENT: Denies blurred vision, double vision, hearing loss, tinnitus, sinus congestion, rhinorrhea, sore throat, neck stiffness, dysphagia PULM: Denies shortness of breath, cough, sputum production, hemoptysis, wheezing CV: Denies chest pain, edema, orthopnea, paroxysmal nocturnal dyspnea, palpitations GI: Endorses abdominal pain, nausea, constipation. Denies vomiting, diarrhea, hematochezia, melena GU: Denies dysuria, hematuria, polyuria, oliguria, urethral discharge Endocrine: Denies hot or cold intolerance, polyuria, polyphagia or appetite change Derm: Denies rash, dry skin, scaling or peeling skin change Heme: Denies easy bruising, bleeding, bleeding gums Neuro: Denies headache, numbness, weakness, slurred speech, loss of memory or consciousness   Past Medical History:  She,  has a past medical history of Anemia, Arthritis, Blood transfusion ('08), Chronic diastolic CHF (congestive heart failure) (HCC), Claustrophobia, Dysfunctional uterine bleeding  (12/19/2010), ESRD (end stage renal disease) on dialysis (HCC), GERD (gastroesophageal reflux disease), Headache, Hemodialysis patient (HCC), History of hiatal hernia, cardiovascular stress test, Hypertension, Lupus (HCC), Mitral stenosis, Peptic ulcer disease, Pneumonia (10/21/2017), S/P aortic valve replacement with metallic valve (12/02/2018), S/P mitral valve replacement with metallic valve (12/02/2018), Stroke (HCC), and Valvular heart disease (Aortic Stenosis and Mitral Stenosis) (09/25/2016).   Surgical History:   Past Surgical History:  Procedure Laterality Date  . AORTIC VALVE REPLACEMENT  12/02/2018   AORTIC VALVE REPLACEMENT (AVR) USING CARBOMEDICS SUPRA-ANNULAR TOP HAT SIZE 21MM (N/A)  . AORTIC VALVE REPLACEMENT N/A 12/02/2018   Procedure: AORTIC VALVE REPLACEMENT (AVR) USING CARBOMEDICS SUPRA-ANNULAR TOP HAT SIZE 21MM;  Surgeon: Owen, Clarence H, MD;  Location: [facility];  Service: Open Heart Surgery;  Laterality: N/A;  . AV FISTULA PLACEMENT Right 02/25/2018   Procedure: INSERTION OF 6mm x 16cm ARTEGRAFT;  Surgeon: Cain, Brandon Christopher, MD;  Location: [facility] OR;  Service: Vascular;  Laterality: Right;  . COLONOSCOPY W/ BIOPSIES AND POLYPECTOMY    . DIALYSIS FISTULA CREATION  2007  . ENDOMETRIAL ABLATION    . ESOPHAGOGASTRODUODENOSCOPY N/A 07/31/2014   Procedure: ESOPHAGOGASTRODUODENOSCOPY (EGD);  Surgeon: Marc Magod, MD;  Location: [facility];  Service: Endoscopy;  Laterality: N/A;  . FISTULOGRAM Right 02/25/2018   Procedure: FISTULOGRAM ARM;  Surgeon: Cain, Brandon Christopher, MD;  Location: [facility];  Service: Vascular;  Laterality: Right;  . HEMATOMA EVACUATION Right 03/06/2018   Procedure: EVACUATION HEMATOMA RIGHT UPPER ARM;  Surgeon: Cain, Brandon Christopher, MD;  Location: [facility];  Service: Vascular;  Laterality: Right;  . INSERTION OF ARTERIOVENOUS (AV) ARTEGRAFT ARM Right 09/26/2017   Procedure: INSERTION OF ARTERIOVENOUS (AV) ARTEGRAFT INTO RIGHT ARM;  Surgeon: Cain, Brandon  Christopher, MD;  Location: [facility];  Service: Vascular;  Laterality: Right;  . INSERTION OF DIALYSIS CATHETER Right 03/06/2018   Procedure: INSERTION OF DIALYSIS CATHETER, right internal jugular;  Surgeon: Cain, Brandon Christopher, MD;  Location: [facility];  Service: Vascular;  Laterality: Right;  . IR EMBO ART  VEN HEMORR LYMPH EXTRAV  INC GUIDE ROADMAPPING  01/27/2020  . IR RENAL SELECTIVE  UNI INC S&I MOD SED  01/27/2020  . IR THORACENTESIS ASP PLEURAL SPACE W/IMG GUIDE  01/07/2019  . IR US GUIDE VASC ACCESS RIGHT  01/27/2020  . MITRAL VALVE REPLACEMENT N/A 12/02/2018   Procedure: MITRAL VALVE (MV) REPLACEMENT USING CARBOMEDICS OPTIFORM SIZE 31MM;  Surgeon: Owen, Clarence H, MD;  Location: [facility];    Service: Open Heart Surgery;  Laterality: N/A;  . REVISON OF ARTERIOVENOUS FISTULA Right 09/26/2017   Procedure: REVISION OF ARTERIOVENOUS FISTULA RIGHT ARM WITH ARTEGRAFT;  Surgeon: Waynetta Sandy, MD;  Location: [facility];  Service: Vascular;  Laterality: Right;  . REVISON OF ARTERIOVENOUS FISTULA Right 02/25/2018   Procedure: REVISION OF ARTERIOVENOUS FISTULA;  Surgeon: Waynetta Sandy, MD;  Location: [place];  Service: Vascular;  Laterality: Right;  . RIGHT/LEFT HEART CATH AND CORONARY ANGIOGRAPHY N/A 07/24/2018   Procedure: RIGHT/LEFT HEART CATH AND CORONARY ANGIOGRAPHY;  Surgeon: Larey Dresser, MD;  Location: [facility];  Service: Cardiovascular;  Laterality: N/A;  . SHOULDER OPEN ROTATOR CUFF REPAIR Right 10/09/2016   Procedure: ROTATOR CUFF REPAIR SHOULDER OPEN partial acrominectomy and extensive synovectomy;  Surgeon: Latanya [name], MD;  Location: [facility];  Service: Orthopedics;  Laterality: Right;  RNFA  . TEE WITHOUT CARDIOVERSION N/A 01/02/2018   Procedure: TRANSESOPHAGEAL ECHOCARDIOGRAM (TEE) Bubble Study;  Surgeon: Larey Dresser, MD;  Location: [facility];  Service: Cardiovascular;  Laterality: N/A;  . TEE WITHOUT CARDIOVERSION N/A 12/02/2018   Procedure: TRANSESOPHAGEAL  ECHOCARDIOGRAM (TEE);  Surgeon: Rexene Alberts, MD;  Location: [facility];  Service: Open Heart Surgery;  Laterality: N/A;  . TUBAL LIGATION       Social History:   reports that she quit smoking about 19 months ago. Her smoking use included cigarettes. She started smoking about 2 years ago. She has a 1.00 pack-year smoking history. She has never used smokeless tobacco. She reports current alcohol use of about 1.0 standard drink of alcohol per week. She reports previous drug use. Drugs: Marijuana and Cocaine.   Family History:  Her family history includes Breast cancer in her mother; Hypertension in her father and mother; Liver cancer in her maternal grandmother; Lymphoma in her maternal aunt; Renal Disease in her father. There is no history of Heart attack.   Allergies No Known Allergies   Home Medications  Prior to Admission medications   Medication Sig Start Date End Date Taking? Authorizing Provider  amoxicillin (AMOXIL) 500 MG capsule Take 4 capsules (2,000 mg total) by mouth once as needed (1 hour prior to any dental appointment/procedure including cleaning). 05/18/20   Loel Dubonnet, NP  AURYXIA 1 GM 210 MG(Fe) tablet Take 840 mg by mouth 3 (three) times daily. 12/04/19   [provider]  bisacodyl (DULCOLAX) 5 MG EC tablet Take 5 mg by mouth daily as needed for moderate constipation.    [provider]  calcitRIOL (ROCALTROL) 0.5 MCG capsule Take 4 capsules (2 mcg total) by mouth every Monday, Wednesday, and Friday. 02/12/20   Pokhrel, Corrie Mckusick, MD  cinacalcet (SENSIPAR) 30 MG tablet Take 4 tablets (120 mg total) by mouth every Monday, Wednesday, and Friday with hemodialysis. 02/12/20   Pokhrel, Corrie Mckusick, MD  Methoxy PEG-Epoetin Beta (MIRCERA IJ) Done at dialysis 02/17/20 02/15/21  [provider]  metoprolol tartrate (LOPRESSOR) 25 MG tablet Take 1 tablet (25 mg total) by mouth 2 (two) times daily. 02/12/20   Pokhrel, Corrie Mckusick, MD  multivitamin (RENA-VIT) TABS tablet  Take 1 tablet by mouth at bedtime.     [provider]  ondansetron (ZOFRAN) 4 MG tablet Take 4 mg by mouth every 6 (six) hours as needed for nausea or vomiting.    [provider]  pantoprazole (PROTONIX) 40 MG tablet Take 1 tablet (40 mg total) by mouth daily. 02/12/20   Pokhrel, Corrie Mckusick, MD  warfarin (COUMADIN) 3 MG tablet Take 2-3 tablets  by mouth daily as instructed by the coumadin clinic. 07/04/20   Ross, Paula V, MD     Critical care time: 40 min     CRITICAL CARE Performed by: Grace E Bowser   Total critical care time: 40 minutes  Critical care time was exclusive of separately billable procedures and treating other patients. Critical care was necessary to treat or prevent imminent or life-threatening deterioration.  Critical care was time spent personally by me on the following activities: development of treatment plan with patient and/or surrogate as well as nursing, discussions with consultants, evaluation of patient's response to treatment, examination of patient, obtaining history from patient or surrogate, ordering and performing treatments and interventions, ordering and review of laboratory studies, ordering and review of radiographic studies, pulse oximetry and re-evaluation of patient's condition.  Grace Bowser MSN, AGACNP-BC [facility] [phone] If no answer, [phone] 07/05/2020, 4:36 PM  

## 2020-07-15 NOTE — ED Notes (Signed)
Blood sugar is low again admitting doctor aware med being ordered

## 2020-07-15 NOTE — ED Notes (Signed)
Labs have not been drawn the pt has been getting  Blood transfused unable to get the blood until 2300

## 2020-07-15 NOTE — Progress Notes (Signed)
Finger stick 24 improved with D50 to 100 and dropped to 65, normal dose of D50, 1 dose of glucagon and continue to monitor.

## 2020-07-15 NOTE — ED Provider Notes (Signed)
[facility] EMERGENCY DEPARTMENT Provider Note   CSN: [id number] Arrival date & time: 07/13/2020  1008     History Chief Complaint  Patient presents with  . Abdominal Pain    [name] is a 60 y.o. female.  HPI Patient is a 60 year old female with past medical history of ESRD on dialysis Monday Wednesday Friday since her last dialysis was Monday, diverticulosis, anemia requiring blood transfusions, CHF, mechanical heart valves x2, stroke  Not reported by patient but discovered on chart review: Patient was admitted in September 2021 for retroperitoneal hematoma/bleed.  Received multiple units of FFP and blood.  Underwent left renal angio/embolization 01/27/2020 she is on Coumadin for mechanical heart valves which she has continued to take.   Patient is presented to the emergency room today with severe lower abdominal pain.  She states that it is constant achy and severe.  She states that is worse when she touches it.  She states that it is associated with constipation.  She states that she has forced out several hard stools over the past week.  She states that her last bowel movement however was yesterday was somewhat soft.  She denies any blood in her stool.  No dark or tarry stool that she recalls.  She states it is hard.  She states that she has never had any abdominal surgeries. She denies any vaginal discharge or vaginal itching or pain.  She denies any recent sexual activity.  Denies any urinary frequency urgency or dysuria  No fevers or chills.  No other associate symptoms.  Dulcolax without improvement.     Past Medical History:  Diagnosis Date  . Anemia   . Arthritis    "joints" (10/23/2017)  . Blood transfusion '08   [facility]; "low HgB" (10/23/2017)  . Chronic diastolic CHF (congestive heart failure) ([place])   . Claustrophobia   . Dysfunctional uterine bleeding 12/19/2010  . ESRD (end stage renal disease) on dialysis [facility])    "MWF; [name]." (10/23/2017)   . GERD (gastroesophageal reflux disease)   . Headache   . Hemodialysis patient [facility])    right extremity port  . History of hiatal hernia   . Hx of cardiovascular stress test    Lexiscan Myoview 4/16:  Normal stress nuclear study, EF 59%  . Hypertension   . Lupus ([place])    "? kind" (10/23/2017)  . Mitral stenosis    Echo 4/16:  EF 55-60%, no RWMA, Gr 1 DD, mod MS (mean 9 mmHg), mod LAE, mild RAE, PASP 65, mod to severe TR, trivial eff // Echo 6/19:  Mild LVH, EF 55-60, no RWMA, Gr 2 DD, mild to mod AS (Mean 20), severe MS (mean 17), massive LAE, PASP 48, trivial effusion   . Peptic ulcer disease   . Pneumonia 10/21/2017  . S/P aortic valve replacement with metallic valve 3/87/5643   21 mm Sorin Carbomedics bileaflet mechanical valve  . S/P mitral valve replacement with metallic valve 08/17/5186   31 mm Sorin Carbomedics Optiform bileaflet mechanical valve  . Stroke [facility])    per patient "they said i had a small stroke but i couldnt even tell"  . Valvular heart disease (Aortic Stenosis and Mitral Stenosis) 09/25/2016   s/p mechanical AVR and MVR 11/2018 // Echo 02/2019: EF 65-70, mild LVH, mild LAE, normally functioning mechanical mitral valve prosthesis with no regurgitation or stenosis, trivial TR, normally functioning mechanical aortic valve prosthesis without regurgitation or stenosis     Patient Active Problem List  Diagnosis Date Noted  . Acquired thrombophilia ([place]) 02/07/2020  . Hemorrhagic shock ([place]) 02/05/2020  . Aortic atherosclerosis ([place]) 02/05/2020  . Urethral bleeding 02/04/2020  . Status post mechanical aortic valve replacement 01/20/2020  . Retroperitoneal hemorrhage 01/06/2020  . Flank pain   . Nontraumatic retroperitoneal hematoma   . Anticoagulated on warfarin   . Hyponatremia 01/07/2019  . Pleural effusion 01/06/2019  . Encounter for therapeutic drug monitoring 12/16/2018  . H/O heart valve replacement with mechanical valve 12/02/2018  . S/P mitral valve  replacement with metallic valve 01/75/1025  . Neck pain 10/27/2018  . Chronic diastolic (congestive) heart failure ([place])   . Symptomatic anemia 03/14/2018  . Hypokalemia 03/14/2018  . Chronic pain 03/14/2018  . GERD (gastroesophageal reflux disease) 03/14/2018  . ESRD needing dialysis ([place])   . Volume overload 10/21/2017  . HCAP (healthcare-associated pneumonia) 10/21/2017  . Acute respiratory failure with hypoxia ([place]) 10/21/2017  . Fluid overload 01/26/2017  . HTN (hypertension) 01/26/2017  . Rotator cuff tear arthropathy, right 10/09/2016  . Aortic stenosis 09/25/2016  . Left hand pain 07/28/2015  . Post-traumatic osteoarthritis of right hand 07/28/2015  . Trigger finger of left thumb 07/28/2015  . History of sepsis 05/01/2015  . Hypoxia 05/01/2015  . Mitral stenosis 08/30/2014  . End-stage renal disease on hemodialysis ([place]) 07/25/2014  . History of pulmonary edema 07/25/2014  . Lupus (systemic lupus erythematosus) ([place]) 07/25/2014  . DUB (dysfunctional uterine bleeding) 07/25/2014  . Anemia 07/25/2014  . Peptic ulcer disease 07/25/2014  . History of stroke 07/25/2014  . Hypertensive heart disease with CHF (congestive heart failure) ([place]) 12/19/2010  . Dialysis care 12/19/2010    Past Surgical History:  Procedure Laterality Date  . AORTIC VALVE REPLACEMENT  12/02/2018   AORTIC VALVE REPLACEMENT (AVR) USING CARBOMEDICS SUPRA-ANNULAR TOP HAT SIZE 21MM (N/A)  . AORTIC VALVE REPLACEMENT N/A 12/02/2018   Procedure: AORTIC VALVE REPLACEMENT (AVR) USING CARBOMEDICS SUPRA-ANNULAR TOP HAT SIZE 21MM;  Surgeon: Rexene Alberts, MD;  Location: [facility];  Service: Open Heart Surgery;  Laterality: N/A;  . AV FISTULA PLACEMENT Right 02/25/2018   Procedure: INSERTION OF 40mm x 16cm ARTEGRAFT;  Surgeon: Waynetta Sandy, MD;  Location: [facility];  Service: Vascular;  Laterality: Right;  . COLONOSCOPY W/ BIOPSIES AND POLYPECTOMY    . DIALYSIS FISTULA CREATION  2007  . ENDOMETRIAL ABLATION     . ESOPHAGOGASTRODUODENOSCOPY N/A 07/31/2014   Procedure: ESOPHAGOGASTRODUODENOSCOPY (EGD);  Surgeon: Clarene Essex, MD;  Location[facility] ENDOSCOPY;  Service: Endoscopy;  Laterality: N/A;  . FISTULOGRAM Right 02/25/2018   Procedure: FISTULOGRAM ARM;  Surgeon: Waynetta Sandy, MD;  Location: [place];  Service: Vascular;  Laterality: Right;  . HEMATOMA EVACUATION Right 03/06/2018   Procedure: EVACUATION HEMATOMA RIGHT UPPER ARM;  Surgeon: Waynetta Sandy, MD;  Location: [place];  Service: Vascular;  Laterality: Right;  . INSERTION OF ARTERIOVENOUS (AV) ARTEGRAFT ARM Right 09/26/2017   Procedure: INSERTION OF ARTERIOVENOUS (AV) ARTEGRAFT INTO RIGHT ARM;  Surgeon: Waynetta Sandy, MD;  Location: [facility];  Service: Vascular;  Laterality: Right;  . INSERTION OF DIALYSIS CATHETER Right 03/06/2018   Procedure: INSERTION OF DIALYSIS CATHETER, right internal jugular;  Surgeon: Waynetta Sandy, MD;  Location: [place];  Service: Vascular;  Laterality: Right;  . IR EMBO ART  VEN HEMORR [place]  01/27/2020  . IR RENAL SELECTIVE  UNI INC S&I MOD SED  01/27/2020  . IR THORACENTESIS ASP PLEURAL SPACE W/IMG GUIDE  01/07/2019  .  IR US GUIDE VASC ACCESS RIGHT  01/27/2020  . MITRAL VALVE REPLACEMENT N/A 12/02/2018   Procedure: MITRAL VALVE (MV) REPLACEMENT USING CARBOMEDICS OPTIFORM SIZE 31MM;  Surgeon: Rexene Alberts, MD;  Location: [place];  Service: Open Heart Surgery;  Laterality: N/A;  . REVISON OF ARTERIOVENOUS FISTULA Right 09/26/2017   Procedure: REVISION OF ARTERIOVENOUS FISTULA RIGHT ARM WITH ARTEGRAFT;  Surgeon: Waynetta Sandy, MD;  Location: [facility];  Service: Vascular;  Laterality: Right;  . REVISON OF ARTERIOVENOUS FISTULA Right 02/25/2018   Procedure: REVISION OF ARTERIOVENOUS FISTULA;  Surgeon: Waynetta Sandy, MD;  Location: [facility];  Service: Vascular;  Laterality: Right;  . RIGHT/LEFT HEART CATH AND CORONARY ANGIOGRAPHY N/A 07/24/2018    Procedure: RIGHT/LEFT HEART CATH AND CORONARY ANGIOGRAPHY;  Surgeon: Larey Dresser, MD;  Location: [place] CV LAB;  Service: Cardiovascular;  Laterality: N/A;  . SHOULDER OPEN ROTATOR CUFF REPAIR Right 10/09/2016   Procedure: ROTATOR CUFF REPAIR SHOULDER OPEN partial acrominectomy and extensive synovectomy;  Surgeon: Latanya [name], MD;  Location: [facility];  Service: Orthopedics;  Laterality: Right;  RNFA  . TEE WITHOUT CARDIOVERSION N/A 01/02/2018   Procedure: TRANSESOPHAGEAL ECHOCARDIOGRAM (TEE) Bubble Study;  Surgeon: Larey Dresser, MD;  Location: [facility] ENDOSCOPY;  Service: Cardiovascular;  Laterality: N/A;  . TEE WITHOUT CARDIOVERSION N/A 12/02/2018   Procedure: TRANSESOPHAGEAL ECHOCARDIOGRAM (TEE);  Surgeon: Rexene Alberts, MD;  Location: [facility];  Service: Open Heart Surgery;  Laterality: N/A;  . TUBAL LIGATION       OB History    Gravida  2   Para      Term      Preterm      AB      Living  2     SAB      IAB      Ectopic      Multiple      Live Births              Family History  Problem Relation Age of Onset  . Liver cancer Maternal Grandmother   . Lymphoma Maternal Aunt   . Hypertension Mother   . Breast cancer Mother   . Renal Disease Father   . Hypertension Father   . Heart attack Neg Hx     Social History   Tobacco Use  . Smoking status: Former Smoker    Packs/day: 0.10    Years: 10.00    Pack years: 1.00    Types: Cigarettes    Start date: 10/21/2017    Quit date: 11/20/2018    Years since quitting: 1.6  . Smokeless tobacco: Never Used  Vaping Use  . Vaping Use: Never used  Substance Use Topics  . Alcohol use: Yes    Alcohol/week: 1.0 standard drink    Types: 1 Cans of beer per week    Comment: occasional beer  . Drug use: Not Currently    Types: Marijuana, Cocaine    Comment: marijuana 11/27/2018    Home Medications Prior to Admission medications   Medication Sig Start Date End Date Taking? Authorizing Provider  amoxicillin  (AMOXIL) 500 MG capsule Take 4 capsules (2,000 mg total) by mouth once as needed (1 hour prior to any dental appointment/procedure including cleaning). 05/18/20   Loel Dubonnet, NP  AURYXIA 1 GM 210 MG(Fe) tablet Take 840 mg by mouth 3 (three) times daily. 12/04/19   [provider]  bisacodyl (DULCOLAX) 5 MG EC tablet Take 5 mg by mouth daily as  needed for moderate constipation.    [provider]  calcitRIOL (ROCALTROL) 0.5 MCG capsule Take 4 capsules (2 mcg total) by mouth every Monday, Wednesday, and Friday. 02/12/20   Pokhrel, Corrie Mckusick, MD  cinacalcet (SENSIPAR) 30 MG tablet Take 4 tablets (120 mg total) by mouth every Monday, Wednesday, and Friday with hemodialysis. 02/12/20   Pokhrel, Corrie Mckusick, MD  Methoxy PEG-Epoetin Beta (MIRCERA IJ) Done at dialysis 02/17/20 02/15/21  [provider]  metoprolol tartrate (LOPRESSOR) 25 MG tablet Take 1 tablet (25 mg total) by mouth 2 (two) times daily. 02/12/20   Pokhrel, Corrie Mckusick, MD  multivitamin (RENA-VIT) TABS tablet Take 1 tablet by mouth at bedtime.     [provider]  ondansetron (ZOFRAN) 4 MG tablet Take 4 mg by mouth every 6 (six) hours as needed for nausea or vomiting.    [provider]  pantoprazole (PROTONIX) 40 MG tablet Take 1 tablet (40 mg total) by mouth daily. 02/12/20   Pokhrel, Corrie Mckusick, MD  warfarin (COUMADIN) 3 MG tablet Take 2-3 tablets by mouth daily as instructed by the coumadin clinic. 07/04/20   Fay Records, MD    Allergies    Patient has no known allergies.  Review of Systems   Review of Systems  Constitutional: Negative for chills and fever.  HENT: Negative for congestion.   Eyes: Negative for pain.  Respiratory: Negative for cough and shortness of breath.   Cardiovascular: Negative for chest pain and leg swelling.  Gastrointestinal: Positive for abdominal pain, constipation and nausea. Negative for diarrhea and vomiting.  Genitourinary: Negative for dysuria.  Musculoskeletal:  Negative for myalgias.  Skin: Negative for rash.  Neurological: Negative for dizziness and headaches.    Physical Exam Updated Vital Signs BP 103/63   Pulse 91   Temp 97.7 F (36.5 C) (Oral)   Resp 18   LMP 12/05/2010 (LMP Unknown)   SpO2 100%   Physical Exam Vitals and nursing note reviewed.  Constitutional:      General: She is not in acute distress.    Comments: 60 year old female appears significantly older than stated age.  Appears significantly uncomfortable. Speaking in full sentences.  Hyperventilating during episodes of waves of pain.  HENT:     Head: Normocephalic and atraumatic.     Nose: Nose normal.  Eyes:     General: No scleral icterus. Cardiovascular:     Rate and Rhythm: Normal rate and regular rhythm.     Pulses: Normal pulses.     Heart sounds: Normal heart sounds.  Pulmonary:     Effort: Pulmonary effort is normal. No respiratory distress.     Breath sounds: No wheezing.  Abdominal:     Palpations: Abdomen is soft.     Tenderness: There is abdominal tenderness. There is no right CVA tenderness or left CVA tenderness.     Comments: Diffuse abdominal tenderness.  Abdomen is soft.  No guarding.  Patient continues to endorse pain with distraction on examination. Significant pain seems to be present in the left lower quadrant and epigastrium.  Negative Murphy sign.  Musculoskeletal:     Cervical back: Normal range of motion.     Right lower leg: No edema.     Left lower leg: No edema.  Skin:    General: Skin is warm and dry.     Capillary Refill: Capillary refill takes less than 2 seconds.  Neurological:     Mental Status: She is alert. Mental status is at baseline.     Comments:  Patient is ANO x3 however somewhat abnormal mentation may be baseline for patient. Seems to have poor insight.  Leaves out important aspects of history including that she had had a spontaneous retroperitoneal hemorrhage 5 months ago.  Also somewhat traumatic and refuses to lie  on her back and said once to stand and lean forward onto the bed.  Psychiatric:        Mood and Affect: Mood normal.        Behavior: Behavior normal.     ED Results / Procedures / Treatments   Labs (all labs ordered are listed, but only abnormal results are displayed) Labs Reviewed  CBC WITH DIFFERENTIAL/PLATELET - Abnormal; Notable for the following components:      Result Value   WBC 15.8 (*)    RBC 2.70 (*)    Hemoglobin 8.7 (*)    HCT 27.8 (*)    MCV 103.0 (*)    RDW 17.3 (*)    Platelets 413 (*)    nRBC 2.3 (*)    Neutro Abs 11.8 (*)    Abs Immature Granulocytes 1.64 (*)    All other components within normal limits  COMPREHENSIVE METABOLIC PANEL - Abnormal; Notable for the following components:   Sodium 132 (*)    Chloride 88 (*)    CO2 20 (*)    Glucose, Bld 119 (*)    BUN 85 (*)    Creatinine, Ser 9.37 (*)    Calcium 8.7 (*)    Albumin 2.7 (*)    Alkaline Phosphatase 164 (*)    GFR, Estimated 4 (*)    Anion gap 24 (*)    All other components within normal limits  PROTIME-INR - Abnormal; Notable for the following components:   Prothrombin Time >90.0 (*)    INR >10.0 (*)    All other components within normal limits  APTT - Abnormal; Notable for the following components:   aPTT 159 (*)    All other components within normal limits  LACTIC ACID, PLASMA - Abnormal; Notable for the following components:   Lactic Acid, Venous 8.6 (*)    All other components within normal limits  POC OCCULT BLOOD, ED - Abnormal; Notable for the following components:   Fecal Occult Bld POSITIVE (*)    All other components within normal limits  CBG MONITORING, ED - Abnormal; Notable for the following components:   Glucose-Capillary 24 (*)    All other components within normal limits  RESP PANEL BY RT-PCR (FLU A&B, COVID) ARPGX2  LIPASE, BLOOD  CBC  LACTIC ACID, PLASMA  LACTIC ACID, PLASMA  COMPREHENSIVE METABOLIC PANEL  HEMOGLOBIN AND HEMATOCRIT, BLOOD  TYPE AND SCREEN   PREPARE RBC (CROSSMATCH)  PREPARE FRESH FROZEN PLASMA  PREPARE FRESH FROZEN PLASMA    EKG EKG Interpretation  Date/Time:  Friday July 15 2020 10:21:14 EST Ventricular Rate:  103 PR Interval:    QRS Duration: 94 QT Interval:  358 QTC Calculation: 469 R Axis:   102 Text Interpretation: Sinus tachycardia Right atrial enlargement Right axis deviation Consider left ventricular hypertrophy Confirmed by Dewaine Conger [phone]) on 07/17/2020 10:23:10 AM   Radiology CT Angio Chest/Abd/Pel for Dissection W and/or Wo Contrast  Result Date: 07/09/2020 CLINICAL DATA:  Severe lower abdominal pain and constipation for 2 days. History of left retroperitoneal hemorrhage status post coil embolization of left renal arteries 01/27/2020. End-stage renal disease on hemodialysis. EXAM: CT ANGIOGRAPHY CHEST, ABDOMEN AND PELVIS TECHNIQUE: Non-contrast CT of the chest was initially obtained. Multidetector CT imaging  through the chest, abdomen and pelvis was performed using the standard protocol during bolus administration of intravenous contrast. Multiplanar reconstructed images and MIPs were obtained and reviewed to evaluate the vascular anatomy. CONTRAST:  172mL OMNIPAQUE IOHEXOL 350 MG/ML SOLN COMPARISON:  01/26/2020 CT abdomen/pelvis. 10/30/2018 CT angiogram of the chest, abdomen and pelvis. FINDINGS: CTA CHEST FINDINGS Cardiovascular: Stable mild cardiomegaly. No significant pericardial effusion/thickening. Aortic and mitral valve prostheses in place. Left anterior descending coronary atherosclerosis. Atherosclerotic nonaneurysmal thoracic aorta. No acute intramural hematoma, dissection, pseudoaneurysm or penetrating atherosclerotic ulcer in the thoracic aorta. Top-normal caliber main pulmonary artery (3.3 cm diameter). No central pulmonary emboli. Mediastinum/Nodes: No discrete thyroid nodules. Mildly patulous thoracic esophagus with fluid level in mid to lower thoracic esophagus. No pathologically enlarged  axillary, mediastinal or hilar lymph nodes. Lungs/Pleura: No pneumothorax. No pleural effusion. A few new scattered indistinct small right pulmonary nodules, largest 4 mm in the peripheral apical right upper lobe (series 8/image 31). Mild-to-moderate platelike atelectasis in the mid to lower lungs bilaterally, right greater than left. No acute consolidative airspace disease or lung masses. Musculoskeletal: No aggressive appearing focal osseous lesions. Intact sternotomy wires. Generalized sclerosis in the thoracic osseous structures, similar, compatible with renal osteodystrophy. Review of the MIP images confirms the above findings. CTA ABDOMEN AND PELVIS FINDINGS VASCULAR Aorta: Atherosclerotic nonaneurysmal abdominal aorta with no dissection or significant stenosis. Celiac: Atherosclerotic without evidence of aneurysm, dissection, vasculitis or significant stenosis. SMA: Atherosclerotic without evidence of aneurysm, dissection or vasculitis. There is irregular multifocal moderate to high-grade stenosis throughout the entire SMA. Renals: Coil embolization of left renal artery near the left renal sinus. Irregular high-grade stenosis of the proximal right renal artery. No pseudoaneurysm. IMA: Patent without evidence of aneurysm, dissection, vasculitis or significant stenosis. Inflow: Patent without evidence of aneurysm, dissection, vasculitis or significant stenosis. Veins: No obvious venous abnormality within the limitations of this arterial phase study. Review of the MIP images confirms the above findings. NON-VASCULAR Hepatobiliary: Normal liver size. Scattered subcentimeter hypodense liver lesions are too small to characterize and are unchanged, presumably benign. No new liver lesions. Normal gallbladder with no radiopaque cholelithiasis. No biliary ductal dilatation. Pancreas: Normal, with no mass or duct dilation. Spleen: Atrophic calcified spleen, unchanged.  No splenic masses. Adrenals/Urinary Tract: Normal  adrenals. Symmetric severe atrophy of kidneys bilaterally. No hydronephrosis. Numerous small simple bilateral renal cysts, largest 1.7 cm in the posterior lower right kidney. Hyperdense subcentimeter posterior lower left renal cortical lesion (series 6/image 96), requiring no follow-up. Bladder is completely collapsed and grossly normal. Stomach/Bowel: Moderate hiatal hernia. Otherwise normal nondistended stomach. Normal caliber small bowel with no small bowel wall thickening. Normal appendix. Moderate diffuse colonic diverticulosis with no large bowel wall thickening or significant pericolonic fat stranding. Vascular/Lymphatic: No pathologically enlarged lymph nodes in the abdomen or pelvis. Reproductive: Mildly enlarged myomatous uterus with coarsely calcified degenerated uterine fibroids, largest 2.3 cm, unchanged. No discrete adnexal masses. Other: No pneumoperitoneum. New large volume hemoperitoneum, most prominent in the perihepatic space and anterior pelvic peritoneum surrounding the uterus and superior to the bladder. No evidence of active contrast extravasation. Musculoskeletal: No aggressive appearing focal osseous lesions. Diffuse sclerosis throughout abdominopelvic skeleton, similar, compatible with renal osteodystrophy. Mild lumbar spondylosis. Review of the MIP images confirms the above findings. IMPRESSION: 1. New large volume hemoperitoneum of unclear source, most prominent in the perihepatic space and anterior pelvic peritoneum surrounding the uterus and superior to the bladder. No evidence of active contrast extravasation. No acute aortic syndrome. 2. Atherosclerotic stenoses throughout the  SMA and proximal right renal artery as detailed. Coil embolization of left renal artery near the left renal sinus. No pseudoaneurysm identified. 3. Moderate hiatal hernia. Mildly patulous thoracic esophagus with fluid level, suggesting esophageal dysmotility and/or gastroesophageal reflux. 4. A few new scattered  indistinct small right pulmonary nodules, largest 4 mm, probably inflammatory. Recommend attention on follow-up chest CT in 3-6 months. 5. Moderate diffuse colonic diverticulosis. 6. Renal osteodystrophy. 7. Mildly enlarged myomatous uterus. 8. Aortic Atherosclerosis (ICD10-I70.0). Critical Value/emergent results were called by telephone at the time of interpretation on 07/08/2020 at 2:06 pm to provider [facility], who verbally acknowledged these results. Electronically Signed   By: Ilona Sorrel M.D.   On: 07/01/2020 14:09    Procedures .Critical Care Performed by: Tedd Sias, PA Authorized by: Tedd Sias, PA   Critical care provider statement:    Critical care time (minutes):  80   Critical care time was exclusive of:  Separately billable procedures and treating other patients and teaching time   Critical care was necessary to treat or prevent imminent or life-threatening deterioration of the following conditions: Intraabdominal hemorrhage, abn vital signs, metabolic crises (lactic acidosis)    Critical care was time spent personally by me on the following activities:  Discussions with consultants, evaluation of patient's response to treatment, examination of patient, review of old charts, re-evaluation of patient's condition, pulse oximetry, ordering and review of radiographic studies, ordering and review of laboratory studies and ordering and performing treatments and interventions   I assumed direction of critical care for this patient from another provider in my specialty: no        Medications Ordered in ED Medications  Chlorhexidine Gluconate Cloth 2 % PADS 6 each (has no administration in time range)  0.9 %  sodium chloride infusion (has no administration in time range)  desmopressin (DDAVP) 20 mcg in sodium chloride 0.9 % 50 mL IVPB (has no administration in time range)  phytonadione (VITAMIN K) tablet 10 mg (has no administration in time range)  dextrose 50 % solution  50 mL (has no administration in time range)  morphine 4 MG/ML injection 4 mg (4 mg Intravenous Given 07/06/2020 1056)  ondansetron (ZOFRAN) injection 4 mg (4 mg Intravenous Given 07/17/2020 1057)  iohexol (OMNIPAQUE) 350 MG/ML injection 100 mL (100 mLs Intravenous Contrast Given 07/05/2020 1319)  acetaminophen (TYLENOL) tablet 1,000 mg (1,000 mg Oral Given 07/14/2020 1438)  sodium chloride 0.9 % bolus 500 mL (500 mLs Intravenous New Bag/Given 06/21/2020 1455)    ED Course  I have reviewed the triage vital signs and the nursing notes.  Pertinent labs & imaging results that were available during my care of the patient were reviewed by me and considered in my medical decision making (see chart for details).  Patient past medical history detailed in HPI presented today for abdominal pain has been ongoing for 1 week seems to have worsened last night.  Physical exam is notable for significant, severe generalized abdominal tenderness with palpation.  She is hypertensive initially blood pressure significantly decreased after morphine ministration.  Patient also seems to have become somewhat more lethargic however she continues to be ANO x3.  She is able answer questions and is properly responsive to questions.  Patient closely monitored throughout entirety of ER visit.  CT imaging notable for hemoperitoneum of unclear source. IMPRESSION: 1. New large volume hemoperitoneum of unclear source, most prominent in the perihepatic space and anterior pelvic peritoneum surrounding the uterus and superior to the bladder. No  evidence of active contrast extravasation. No acute aortic syndrome. 2. Atherosclerotic stenoses throughout the SMA and proximal right renal artery as detailed. Coil embolization of left renal artery near the left renal sinus. No pseudoaneurysm identified. 3. Moderate hiatal hernia. Mildly patulous thoracic esophagus with fluid level, suggesting esophageal dysmotility and/or gastroesophageal reflux. 4. A  few new scattered indistinct small right pulmonary nodules, largest 4 mm, probably inflammatory. Recommend attention on follow-up chest CT in 3-6 months. 5. Moderate diffuse colonic diverticulosis. 6. Renal osteodystrophy. 7. Mildly enlarged myomatous uterus. 8. Aortic Atherosclerosis (ICD10-I70.0). Critical Value/emergent results were called by telephone at the time of interpretation on 06/30/2020 at 2:06 pm to provider [facility], who verbally acknowledged these results. Electronically Signed   By: Ilona Sorrel M.D.   On: 06/27/2020 14:09    Clinical Course as of 07/17/2020 1625  Fri Jul 15, 2020  1522 Discussed with general surgery PA who assessed pt at bedside. After her eval PT came back at >10 (original INR was lost to lab and redraw delayed).  PTT mildly elevated. Will administer FFP and Vitamin K. Also will provide with 500 NS 1 U of blood ordered. Will consult Nephrology to make aware of changes to plan [WF]  1616 Made aware of hypoglycemia at [facility] of 24. 1 Amp of D50 ordered. Bearhugger placed on pt due to hypothermia.CBG recheck ordered  [WF]    Clinical Course User Index [WF] Tedd Sias, [place]   General surgery, nephrology, critical care all aware of patient.  And have assessed patient at bedside.  MDM Rules/Calculators/A&P                          Patient is requiring hospitalization for intra-abdominal spontaneous hemorrhage.  She has a supratherapeutic INR with a greater than 10 PT.  She will be started on finding K p.o. as well as FFP will also receive PRBC.  Discussed with nephrology who is aware of patient given that she has ESRD on dialysis no longer makes urine will likely need dialysis sometime during hospital stay.  Also discussed with general surgery who has no surgical interventional recommendations at this time.  Patient is no evidence of active bleeding with no contrast extravasation on arterial study.  Discussed with critical care who will assess patient at  bedside.  Patient will ultimately need hospitalization whether by hospitalist or critical care.  Patient care handed off to oncoming team at 4:15 PM.   Final Clinical Impression(s) / ED Diagnoses Final diagnoses:  Hemoperitoneum  Supratherapeutic INR  Generalized abdominal pain  ESRD (end stage renal disease) on dialysis ([place])  Lactic acidosis  Anemia, unspecified type    Rx / DC Orders ED Discharge Orders    None       Tedd Sias, [place] 06/27/2020 1626    Breck Coons, MD July 25, 2020 [phone]

## 2020-07-15 NOTE — ED Triage Notes (Addendum)
Pt here with reports of being constipated and taking laxatives. Pt now reports lower abdominal pain. Endorses nausea. Denies fevers. Pt reports last dialysis was on Monday.

## 2020-07-16 ENCOUNTER — Inpatient Hospital Stay (HOSPITAL_COMMUNITY): Payer: Medicare Other

## 2020-07-16 DIAGNOSIS — R1084 Generalized abdominal pain: Secondary | ICD-10-CM | POA: Diagnosis not present

## 2020-07-16 DIAGNOSIS — D62 Acute posthemorrhagic anemia: Secondary | ICD-10-CM

## 2020-07-16 DIAGNOSIS — Z952 Presence of prosthetic heart valve: Secondary | ICD-10-CM

## 2020-07-16 DIAGNOSIS — E162 Hypoglycemia, unspecified: Secondary | ICD-10-CM

## 2020-07-16 DIAGNOSIS — J690 Pneumonitis due to inhalation of food and vomit: Secondary | ICD-10-CM

## 2020-07-16 DIAGNOSIS — A419 Sepsis, unspecified organism: Secondary | ICD-10-CM

## 2020-07-16 DIAGNOSIS — N186 End stage renal disease: Secondary | ICD-10-CM | POA: Diagnosis not present

## 2020-07-16 DIAGNOSIS — K661 Hemoperitoneum: Secondary | ICD-10-CM | POA: Diagnosis not present

## 2020-07-16 DIAGNOSIS — Z7901 Long term (current) use of anticoagulants: Secondary | ICD-10-CM

## 2020-07-16 DIAGNOSIS — E872 Acidosis: Secondary | ICD-10-CM

## 2020-07-16 DIAGNOSIS — R6521 Severe sepsis with septic shock: Secondary | ICD-10-CM

## 2020-07-16 DIAGNOSIS — R791 Abnormal coagulation profile: Secondary | ICD-10-CM | POA: Diagnosis not present

## 2020-07-16 DIAGNOSIS — D689 Coagulation defect, unspecified: Secondary | ICD-10-CM

## 2020-07-16 LAB — CBC
HCT: 20.3 % — ABNORMAL LOW (ref 36.0–46.0)
HCT: 21.6 % — ABNORMAL LOW (ref 36.0–46.0)
HCT: 25.1 % — ABNORMAL LOW (ref 36.0–46.0)
Hemoglobin: 7 g/dL — ABNORMAL LOW (ref 12.0–15.0)
Hemoglobin: 7 g/dL — ABNORMAL LOW (ref 12.0–15.0)
Hemoglobin: 9 g/dL — ABNORMAL LOW (ref 12.0–15.0)
MCH: 34 pg (ref 26.0–34.0)
MCH: 33.2 pg (ref 26.0–34.0)
MCH: 32.1 pg (ref 26.0–34.0)
MCHC: 34.5 g/dL (ref 30.0–36.0)
MCHC: 32.4 g/dL (ref 30.0–36.0)
MCHC: 35.9 g/dL (ref 30.0–36.0)
MCV: 98.5 fL (ref 80.0–100.0)
MCV: 102.4 fL — ABNORMAL HIGH (ref 80.0–100.0)
MCV: 89.6 fL (ref 80.0–100.0)
Platelets: 286 10*3/uL (ref 150–400)
Platelets: 268 10*3/uL (ref 150–400)
Platelets: 178 10*3/uL (ref 150–400)
RBC: 2.06 MIL/uL — ABNORMAL LOW (ref 3.87–5.11)
RBC: 2.11 MIL/uL — ABNORMAL LOW (ref 3.87–5.11)
RBC: 2.8 MIL/uL — ABNORMAL LOW (ref 3.87–5.11)
RDW: 16.5 % — ABNORMAL HIGH (ref 11.5–15.5)
RDW: 17.1 % — ABNORMAL HIGH (ref 11.5–15.5)
RDW: 14.8 % (ref 11.5–15.5)
WBC: 18.3 10*3/uL — ABNORMAL HIGH (ref 4.0–10.5)
WBC: 22.6 10*3/uL — ABNORMAL HIGH (ref 4.0–10.5)
WBC: 18 10*3/uL — ABNORMAL HIGH (ref 4.0–10.5)
nRBC: 10.4 % — ABNORMAL HIGH (ref 0.0–0.2)
nRBC: 9.3 % — ABNORMAL HIGH (ref 0.0–0.2)
nRBC: 5.9 % — ABNORMAL HIGH (ref 0.0–0.2)

## 2020-07-16 LAB — BASIC METABOLIC PANEL
Anion gap: 27 — ABNORMAL HIGH (ref 5–15)
BUN: 93 mg/dL — ABNORMAL HIGH (ref 6–20)
CO2: 18 mmol/L — ABNORMAL LOW (ref 22–32)
Calcium: 8.1 mg/dL — ABNORMAL LOW (ref 8.9–10.3)
Chloride: 90 mmol/L — ABNORMAL LOW (ref 98–111)
Creatinine, Ser: 9.33 mg/dL — ABNORMAL HIGH (ref 0.44–1.00)
GFR, Estimated: 4 mL/min — ABNORMAL LOW (ref >60)
Glucose, Bld: 79 mg/dL (ref 70–99)
Potassium: 5.9 mmol/L — ABNORMAL HIGH (ref 3.5–5.1)
Sodium: 135 mmol/L (ref 135–145)

## 2020-07-16 LAB — COMPREHENSIVE METABOLIC PANEL
ALT: 137 U/L — ABNORMAL HIGH (ref 0–44)
AST: 207 U/L — ABNORMAL HIGH (ref 15–41)
Albumin: 2.6 g/dL — ABNORMAL LOW (ref 3.5–5.0)
Alkaline Phosphatase: 103 U/L (ref 38–126)
Anion gap: 17 — ABNORMAL HIGH (ref 5–15)
BUN: 25 mg/dL — ABNORMAL HIGH (ref 6–20)
CO2: 25 mmol/L (ref 22–32)
Calcium: 8.1 mg/dL — ABNORMAL LOW (ref 8.9–10.3)
Chloride: 94 mmol/L — ABNORMAL LOW (ref 98–111)
Creatinine, Ser: 2.78 mg/dL — ABNORMAL HIGH (ref 0.44–1.00)
GFR, Estimated: 19 mL/min — ABNORMAL LOW (ref >60)
Glucose, Bld: 98 mg/dL (ref 70–99)
Potassium: 2.9 mmol/L — ABNORMAL LOW (ref 3.5–5.1)
Sodium: 136 mmol/L (ref 135–145)
Total Bilirubin: 0.8 mg/dL (ref 0.3–1.2)
Total Protein: 6.4 g/dL — ABNORMAL LOW (ref 6.5–8.1)

## 2020-07-16 LAB — BPAM FFP
Blood Product Expiration Date: 202203022359
Blood Product Expiration Date: 202203022359
ISSUE DATE / TIME: 202202251724
ISSUE DATE / TIME: 202202251724
Unit Type and Rh: 600
Unit Type and Rh: 6200

## 2020-07-16 LAB — GLUCOSE, CAPILLARY
Glucose-Capillary: 104 mg/dL — ABNORMAL HIGH (ref 70–99)
Glucose-Capillary: 131 mg/dL — ABNORMAL HIGH (ref 70–99)
Glucose-Capillary: 134 mg/dL — ABNORMAL HIGH (ref 70–99)
Glucose-Capillary: 135 mg/dL — ABNORMAL HIGH (ref 70–99)
Glucose-Capillary: 31 mg/dL — CL (ref 70–99)
Glucose-Capillary: 48 mg/dL — ABNORMAL LOW (ref 70–99)
Glucose-Capillary: 51 mg/dL — ABNORMAL LOW (ref 70–99)
Glucose-Capillary: 58 mg/dL — ABNORMAL LOW (ref 70–99)
Glucose-Capillary: 70 mg/dL (ref 70–99)
Glucose-Capillary: 76 mg/dL (ref 70–99)
Glucose-Capillary: 92 mg/dL (ref 70–99)

## 2020-07-16 LAB — PREPARE FRESH FROZEN PLASMA

## 2020-07-16 LAB — MAGNESIUM: Magnesium: 2.3 mg/dL (ref 1.7–2.4)

## 2020-07-16 LAB — PROTIME-INR
INR: 7.5 (ref 0.8–1.2)
INR: 2.8 — ABNORMAL HIGH (ref 0.8–1.2)
Prothrombin Time: 61.7 seconds — ABNORMAL HIGH (ref 11.4–15.2)
Prothrombin Time: 28.7 seconds — ABNORMAL HIGH (ref 11.4–15.2)

## 2020-07-16 LAB — CORTISOL-PM, BLOOD: Cortisol - PM: 80.3 ug/dL — ABNORMAL HIGH (ref <10.0)

## 2020-07-16 LAB — PREPARE RBC (CROSSMATCH)

## 2020-07-16 LAB — APTT: aPTT: 87 seconds — ABNORMAL HIGH (ref 24–36)

## 2020-07-16 LAB — LACTIC ACID, PLASMA
Lactic Acid, Venous: 2.8 mmol/L (ref 0.5–1.9)
Lactic Acid, Venous: 4.6 mmol/L (ref 0.5–1.9)

## 2020-07-16 LAB — PHOSPHORUS: Phosphorus: 8.8 mg/dL — ABNORMAL HIGH (ref 2.5–4.6)

## 2020-07-16 LAB — ACETAMINOPHEN LEVEL: Acetaminophen (Tylenol), Serum: 12 ug/mL (ref 10–30)

## 2020-07-16 LAB — FIBRINOGEN: Fibrinogen: 660 mg/dL — ABNORMAL HIGH (ref 210–475)

## 2020-07-16 MED ORDER — FENTANYL CITRATE (PF) 100 MCG/2ML IJ SOLN
25.0000 ug | Freq: Once | INTRAMUSCULAR | Status: AC
  Administered 2020-07-16: 25 ug via INTRAVENOUS

## 2020-07-16 MED ORDER — HEPARIN SODIUM (PORCINE) 1000 UNIT/ML DIALYSIS
1000.0000 [IU] | INTRAMUSCULAR | Status: DC | PRN
  Filled 2020-07-16: qty 1

## 2020-07-16 MED ORDER — SODIUM CHLORIDE 0.9 % IV SOLN: 100.0000 mL | INTRAVENOUS | Status: DC | PRN

## 2020-07-16 MED ORDER — LIDOCAINE HCL (PF) 1 % IJ SOLN: 5.0000 mL | INTRAMUSCULAR | Status: DC | PRN

## 2020-07-16 MED ORDER — PENTAFLUOROPROP-TETRAFLUOROETH EX AERO: 1.0000 "application " | INHALATION_SPRAY | CUTANEOUS | Status: DC | PRN

## 2020-07-16 MED ORDER — ALTEPLASE 2 MG IJ SOLR
2.0000 mg | Freq: Once | INTRAMUSCULAR | Status: DC | PRN
  Filled 2020-07-16: qty 2

## 2020-07-16 MED ORDER — PHYTONADIONE 5 MG PO TABS
10.0000 mg | ORAL_TABLET | Freq: Once | ORAL | Status: AC
  Administered 2020-07-16: 10 mg via ORAL
  Filled 2020-07-16: qty 2

## 2020-07-16 MED ORDER — SODIUM CHLORIDE 0.9% IV SOLUTION: Freq: Once | INTRAVENOUS | Status: DC

## 2020-07-16 MED ORDER — DEXTROSE 10 % IV SOLN: INTRAVENOUS | Status: DC

## 2020-07-16 MED ORDER — SODIUM CHLORIDE 0.9 % IV SOLN
80.0000 mg | Freq: Two times a day (BID) | INTRAVENOUS | Status: DC
  Administered 2020-07-16 – 2020-07-18 (×4): 80 mg via INTRAVENOUS
  Filled 2020-07-16 (×6): qty 80

## 2020-07-16 MED ORDER — DEXTROSE-NACL 5-0.9 % IV SOLN: INTRAVENOUS | Status: DC

## 2020-07-16 MED ORDER — PIPERACILLIN-TAZOBACTAM IN DEX 2-0.25 GM/50ML IV SOLN
2.2500 g | Freq: Three times a day (TID) | INTRAVENOUS | Status: DC
  Administered 2020-07-16 – 2020-07-17 (×3): 2.25 g via INTRAVENOUS
  Filled 2020-07-16 (×6): qty 50

## 2020-07-16 MED ORDER — IOHEXOL 350 MG/ML SOLN
100.0000 mL | Freq: Once | INTRAVENOUS | Status: AC | PRN
  Administered 2020-07-16: 100 mL via INTRAVENOUS

## 2020-07-16 MED ORDER — DEXTROSE 50 % IV SOLN
25.0000 g | INTRAVENOUS | Status: AC
  Administered 2020-07-16: 25 g via INTRAVENOUS
  Filled 2020-07-16 (×2): qty 100

## 2020-07-16 MED ORDER — WHITE PETROLATUM EX OINT
TOPICAL_OINTMENT | CUTANEOUS | Status: AC
  Filled 2020-07-16: qty 28.35

## 2020-07-16 MED ORDER — LIDOCAINE-PRILOCAINE 2.5-2.5 % EX CREA
1.0000 "application " | TOPICAL_CREAM | CUTANEOUS | Status: DC | PRN
  Filled 2020-07-16: qty 5

## 2020-07-16 MED ORDER — DEXTROSE 50 % IV SOLN
INTRAVENOUS | Status: AC
  Administered 2020-07-16: 50 mL
  Filled 2020-07-16: qty 50

## 2020-07-16 MED ORDER — FENTANYL CITRATE (PF) 100 MCG/2ML IJ SOLN
INTRAMUSCULAR | Status: AC
  Filled 2020-07-16: qty 2

## 2020-07-16 MED ORDER — DEXTROSE 50 % IV SOLN: 25.0000 g | Freq: Once | INTRAVENOUS | Status: DC

## 2020-07-16 NOTE — Progress Notes (Signed)
Pharmacy Antibiotic Note  [name] is a 60 y.o. female admitted on 07/02/2020 with concern for intra-abdominal infection.  Pharmacy has been consulted for Zosyn dosing.  Patient with complaints of multiple days of abdominal pain and constipation, with large volume hemoperitoneum noted on admission. ESRD on HD MWF as outpatient; had HD this am where she become hypotensive and lethargic. She is afebrile with elevated WBC of 22.6 and LAC of 4.1.   Plan: Start Zosyn 2.25 g IV q8h - F/u cultures, LOT  Temp (24hrs), Avg:96.1 F (35.6 C), Min:93.3 F (34.1 C), Max:97.8 F (36.6 C)  Recent Labs  Lab 07/08/2020 1050 06/24/2020 1412 07/17/2020 2307 07/16/20 0152  WBC 15.8*  --  18.3* 22.6*  CREATININE 9.37*  --  9.46* 9.33*  LATICACIDVEN  --  8.6* 4.1*  --     CrCl cannot be calculated (Unknown ideal weight.).    No Known Allergies  Antimicrobials this admission: Zosyn 2/26 >>   Microbiology results: 2/26 BCx x2:   Thank you for allowing pharmacy to be a part of this patient's care.  Claudina Lick, PharmD PGY1 Acute Care Pharmacy Resident 07/16/2020 8:50 AM  Please check [email or web address] for unit-specific pharmacy phone numbers.

## 2020-07-16 NOTE — Significant Event (Signed)
Rapid Response Event Note   Reason for Call :  Unresponsive  Initial Focused Assessment:  Called from HD stating that this patient was unresponsive.  The patient had just got to HD and the RN had not started the HD.  The patient was alert when I got to the HD bay 5 but she was a little lethargic.  BP was low in HD so the patient received a 500 cc bolus of NS.  She had weak pulses but carotid was auscultated.  Her femoral pulses were only assessed by doppler.  The patient did not complain of chest pain or shortness of breath.  She was complaining of back pain and tenderness in her abdomen.  CCM came by to assess the patient and put in transfer orders to MICU.  Unfortunately with staffing they could not receive the patient at that time so the patient was taken back to 2W.  The patient was taken to CT for a CT abdomen.  When we got the patient on the CT table she refused to get the scan without pain medication.  A verbal of 25 mcg of fentanyl (once) was given by CCM NP.  The patient finally consented to procede with the CT scan.  The patient was dropped off at 2G-31 with no complications.  The patient is still complaining of back pain and abdomen pain.  The rapid response RN was able to switch the patient to her second unit of PRBC on the way up.  The patient's vital signs have been erratic but stable.    BP 77/44 ECG 79 RR 21 O2 96 (16L NRB) Temp 93.3 rectal   Interventions: Fluid bolus given, blood products started, CT scan of abdomen complete, patient transported to 47M-07  Plan of Care:  Patient will remain on 47M-07    Event Summary:   MD Notified: CCM Call Time: 0700 Arrival Time: 0705 End Time: [place], RN

## 2020-07-16 NOTE — Progress Notes (Signed)
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  She was admitted for acute blood loss anemia due to hemoperitoneum as noted on CT abdomen and pelvis without active extravasation in the setting of coagulopathy with INR> 10.  Hgb was 8.7 which is close to her baseline.  She was transfused a unit of blood and 2 units of FFP.  She also has significant lactic acidosis with SIRS without clear source of infection.  Blood cultures obtained.  The decision was made to observe off antibiotics.   The next day, Hgb down to 7.0.  She was taken for HD found to be hypotensive with SBP in 40s and altered mental status. Rapid response activated.  3 units of blood and 2 units of FFP ordered.  PCCM and IR consulted. Patient was transferred to ICU for further care.  Subjective: Seen and examined earlier this morning during rapid response due to hypotension.  SBP in 40s (confirmed manually).  She is not tachycardic but hypothermic.  She is altered although responds to voice and follows some commands.  She has palpable carotid pulse and Doppler audible femoral pulse but cold extremities.  She is in Trendelenburg.  PCCM consulted.  Per PCCM, abdomen is more distended compared to their prior exam.   Objective: Vitals:   07/16/20 0757 07/16/20 0812 07/16/20 1001 07/16/20 1016  BP: (!) 60/40 (!) 61/35 (!) 77/44 (!) 77/44  Pulse: 78 78 84 79  Resp: 17 18 18  (!) 21  Temp: (!) 93.3 F (34.1 C) (!) 93.3 F (34.1 C) (!) 94.3 F (34.6 C) (!) 94.4 F (34.7 C)  TempSrc: Rectal Rectal Rectal Oral  SpO2: 95% 95% 95% 96%    Intake/Output  Summary (Last 24 hours) at 07/16/2020 1054 Last data filed at 07/16/2020 1000 Gross per 24 hour  Intake 2224.98 ml  Output 0 ml  Net 2224.98 ml   There were no vitals filed for this visit.  Examination:  GENERAL: She is altered.  In Trendelenburg position. HEENT: MMM.  Vision and hearing grossly intact.  NECK: Supple.  No apparent JVD.  Palpable carotid pulse. RESP:  No IWOB.  Fair aeration bilaterally. CVS:  RRR. Heart sounds normal.  ABD/GI/GU: BS+. Abd soft.  Mild tenderness. MSK/EXT:  Moves extremities but cold to touch.  No edema. SKIN: no apparent skin lesion or wound NEURO: Somewhat altered.  No apparent focal neuro deficit but limited exam due to mental status.  Procedures:  None  Microbiology summarized: COVID-19 PCR nonreactive. Blood culture pending.  Assessment & Plan: Acute on chronic blood loss anemia likely due to hemoperitoneum in the setting of supratherapeutic INR Recent Labs    02/05/20 0259 02/06/20 0604 02/08/20 0410 02/09/20 0358 02/10/20 0500 02/11/20 0927 02/12/20 0828 06/27/2020 1050 07/10/2020 2307 07/16/20 0152  HGB 8.4* 8.8* 8.6* 9.0* 9.1* 9.5* 9.1* 8.7* 7.0* 7.0*  -H&H down to 7.0 after a unit of PRBC and 2 units of FFP.  INR 7.5 at 2 AM this morning -Ordered 3 units of PRBC and 2 units of FFP.  Call blood bank for emergent release if they don't have crossmatched blood ready from previous order. -Signed out to PCCM at  bedside -Discussed with IR, Dr. Pascal Lux who suggested reversing coagulopathy. Per Pascal Lux, that is not the area they normally embolize  Coumadin induced coagulopathy in patient with mechanical heart valves -Management as above  Severe sepsis with septic shock due to aspiration pneumonia: POA . Has leukocytosis, tachycardia, tachypnea, hypothermia, lactic acidosis and hypotension.  CTA concerning for bibasilar consolidation concerning for aspiration pneumonia in the setting of distended and fluid-filled esophagus and the  stomach. -Agree with starting Zosyn per CCM. -Follow cultures  Hypoglycemia: CBG down to 51 this morning, from 76 about 2 hours prior.  No history of diabetes.  Not on insulin. -Dextrose infusion  Leukocytosis: Likely due to sepsis. -Antibiotics as above  ESRD on HD MWF -none nephrology on board.  There is no height or weight on file to calculate BMI.         DVT prophylaxis:  SCDs Start: 06/29/2020 1648  Code Status: Full code Family Communication: Patient and/or RN Level of care: ICU Status is: Inpatient  Remains inpatient appropriate because:Hemodynamically unstable, Altered mental status, Unsafe d/c plan, IV treatments appropriate due to intensity of illness or inability to take PO and Inpatient level of care appropriate due to severity of illness   Dispo: The patient is from: Home              Anticipated d/c is to: To be determined              Patient currently is not medically stable to d/c.   Difficult to place patient No       Consultants:  PCCM IR Nephrology   Sch Meds:  Scheduled Meds: . sodium chloride   Intravenous Once  . sodium chloride   Intravenous Once  . [START ON 08-12-2020] calcitRIOL  2 mcg Oral Q M,W,F  . Chlorhexidine Gluconate Cloth  6 each Topical Q0600  . [START ON 08-12-2020] cinacalcet  180 mg Oral Q M,W,F-HD  . [START ON 07/22/2020] darbepoetin (ARANESP) injection - DIALYSIS  40 mcg Intravenous Q Fri-HD  . dextrose  25 g Intravenous Once  . fentaNYL      . fentaNYL (SUBLIMAZE) injection  25 mcg Intravenous Once  . ferric citrate  840 mg Oral TID   Continuous Infusions: . sodium chloride    . sodium chloride    . dextrose 50 mL/hr at 07/16/20 0600  . pantoprazole (PROTONIX) IV    . piperacillin-tazobactam (ZOSYN)  IV     PRN Meds:.sodium chloride, sodium chloride, acetaminophen **OR** acetaminophen, alteplase, heparin, lidocaine (PF), lidocaine-prilocaine, ondansetron,  pentafluoroprop-tetrafluoroeth  Antimicrobials: Anti-infectives (From admission, onward)   Start     Dose/Rate Route Frequency Ordered Stop   07/16/20 0930  piperacillin-tazobactam (ZOSYN) IVPB 2.25 g        2.25 g 100 mL/hr over 30 Minutes Intravenous Every 8 hours 07/16/20 0844         I have personally reviewed the following labs and images: CBC: Recent Labs  Lab 07/13/2020 1050 06/27/2020 2307 07/16/20 0152  WBC 15.8* 18.3* 22.6*  NEUTROABS 11.8*  --   --   HGB 8.7* 7.0* 7.0*  HCT 27.8* 20.3* 21.6*  MCV 103.0* 98.5 102.4*  PLT 413* 286 268   BMP &GFR Recent Labs  Lab 07/07/2020 1050 06/28/2020 2307 07/16/20 0152  NA 132* 133* 135  K 5.1 5.3* 5.9*  CL 88* 90* 90*  CO2 20* 19* 18*  GLUCOSE 119* 125* 79  BUN 85* 90* 93*  CREATININE 9.37* 9.46* 9.33*  CALCIUM 8.7* 8.0* 8.1*  MG  --   --  2.3  PHOS  --   --  8.8*   CrCl cannot be calculated (Unknown ideal weight.). Liver & Pancreas: Recent Labs  Lab 06/26/2020 1050 06/27/2020 2307  AST 23 128*  ALT 22 69*  ALKPHOS 164* 115  BILITOT 0.8 0.8  PROT 6.9 5.8*  ALBUMIN 2.7* 2.4*   Recent Labs  Lab 06/25/2020 1050  LIPASE 20   No results for input(s): AMMONIA in the last 168 hours. Diabetic: Recent Labs    06/29/2020 1050  HGBA1C 5.4   Recent Labs  Lab 07/16/20 0227 07/16/20 0241 07/16/20 0336 07/16/20 0610 07/16/20 0820  GLUCAP 58* 134* 131* 76 51*   Cardiac Enzymes: No results for input(s): CKTOTAL, CKMB, CKMBINDEX, TROPONINI in the last 168 hours. No results for input(s): PROBNP in the last 8760 hours. Coagulation Profile: Recent Labs  Lab 06/26/2020 1414 07/16/20 0152  INR >10.0* 7.5*   Thyroid Function Tests: No results for input(s): TSH, T4TOTAL, FREET4, T3FREE, THYROIDAB in the last 72 hours. Lipid Profile: No results for input(s): CHOL, HDL, LDLCALC, TRIG, CHOLHDL, LDLDIRECT in the last 72 hours. Anemia Panel: No results for input(s): VITAMINB12, FOLATE, FERRITIN, TIBC, IRON, RETICCTPCT in the  last 72 hours. Urine analysis:    Component Value Date/Time   COLORURINE YELLOW 05/01/2015 2005   APPEARANCEUR CLEAR 05/01/2015 2005   LABSPEC 1.012 05/01/2015 2005   PHURINE 8.5 (H) 05/01/2015 2005   GLUCOSEU 100 (A) 05/01/2015 2005   HGBUR SMALL (A) 05/01/2015 2005   BILIRUBINUR NEGATIVE 05/01/2015 2005   KETONESUR NEGATIVE 05/01/2015 2005   PROTEINUR 100 (A) 05/01/2015 2005   UROBILINOGEN 0.2 07/27/2014 0150   NITRITE NEGATIVE 05/01/2015 2005   LEUKOCYTESUR NEGATIVE 05/01/2015 2005   Sepsis Labs: Invalid input(s): PROCALCITONIN, [place]  Microbiology: Recent Results (from the past 240 hour(s))  Resp Panel by RT-PCR (Flu A&B, Covid) Nasopharyngeal Swab     Status: None   Collection Time: 06/25/2020  1:15 PM   Specimen: Nasopharyngeal Swab; Nasopharyngeal(NP) swabs in vial transport medium  Result Value Ref Range Status   SARS Coronavirus 2 by RT PCR NEGATIVE NEGATIVE Final    Comment: (NOTE) SARS-CoV-2 target nucleic acids are NOT DETECTED.  The SARS-CoV-2 RNA is generally detectable in upper respiratory specimens during the acute phase of infection. The lowest concentration of SARS-CoV-2 viral copies this assay can detect is 138 copies/mL. A negative result does not preclude SARS-Cov-2 infection and should not be used as the sole basis for treatment or other patient management decisions. A negative result may occur with  improper specimen collection/handling, submission of specimen other than nasopharyngeal swab, presence of viral mutation(s) within the areas targeted by this assay, and inadequate number of viral copies(<138 copies/mL). A negative result must be combined with clinical observations, patient history, and epidemiological information. The expected result is Negative.  Fact Sheet for Patients:  EntrepreneurPulse.com.au  Fact Sheet for Healthcare Providers:  IncredibleEmployment.be  This test is no t yet approved or  cleared by the [place] FDA and  has been authorized for detection and/or diagnosis of SARS-CoV-2 by FDA under an Emergency Use Authorization (EUA). This EUA will remain  in effect (meaning this test can be used) for the duration of the COVID-19 declaration under Section 564(b)(1) of the Act, 21 U.S.C.section 360bbb-3(b)(1), unless the authorization is terminated  or revoked sooner.       Influenza A by PCR NEGATIVE NEGATIVE Final   Influenza B by PCR NEGATIVE NEGATIVE Final  Comment: (NOTE) The Xpert Xpress SARS-CoV-2/FLU/RSV plus assay is intended as an aid in the diagnosis of influenza from Nasopharyngeal swab specimens and should not be used as a sole basis for treatment. Nasal washings and aspirates are unacceptable for Xpert Xpress SARS-CoV-2/FLU/RSV testing.  Fact Sheet for Patients: EntrepreneurPulse.com.au  Fact Sheet for Healthcare Providers: IncredibleEmployment.be  This test is not yet approved or cleared by the [place] FDA and has been authorized for detection and/or diagnosis of SARS-CoV-2 by FDA under an Emergency Use Authorization (EUA). This EUA will remain in effect (meaning this test can be used) for the duration of the COVID-19 declaration under Section 564(b)(1) of the Act, 21 U.S.C. section 360bbb-3(b)(1), unless the authorization is terminated or revoked.  Performed at [facility], [place] [address] [place], [place] [address]     Radiology Studies: CT Angio Chest/Abd/Pel for Dissection W and/or Wo Contrast  Result Date: 07/13/2020 CLINICAL DATA:  Severe lower abdominal pain and constipation for 2 days. History of left retroperitoneal hemorrhage status post coil embolization of left renal arteries 01/27/2020. End-stage renal disease on hemodialysis. EXAM: CT ANGIOGRAPHY CHEST, ABDOMEN AND PELVIS TECHNIQUE: Non-contrast CT of the chest was initially obtained. Multidetector CT imaging through the chest,  abdomen and pelvis was performed using the standard protocol during bolus administration of intravenous contrast. Multiplanar reconstructed images and MIPs were obtained and reviewed to evaluate the vascular anatomy. CONTRAST:  167mL OMNIPAQUE IOHEXOL 350 MG/ML SOLN COMPARISON:  01/26/2020 CT abdomen/pelvis. 10/30/2018 CT angiogram of the chest, abdomen and pelvis. FINDINGS: CTA CHEST FINDINGS Cardiovascular: Stable mild cardiomegaly. No significant pericardial effusion/thickening. Aortic and mitral valve prostheses in place. Left anterior descending coronary atherosclerosis. Atherosclerotic nonaneurysmal thoracic aorta. No acute intramural hematoma, dissection, pseudoaneurysm or penetrating atherosclerotic ulcer in the thoracic aorta. Top-normal caliber main pulmonary artery (3.3 cm diameter). No central pulmonary emboli. Mediastinum/Nodes: No discrete thyroid nodules. Mildly patulous thoracic esophagus with fluid level in mid to lower thoracic esophagus. No pathologically enlarged axillary, mediastinal or hilar lymph nodes. Lungs/Pleura: No pneumothorax. No pleural effusion. A few new scattered indistinct small right pulmonary nodules, largest 4 mm in the peripheral apical right upper lobe (series 8/image 31). Mild-to-moderate platelike atelectasis in the mid to lower lungs bilaterally, right greater than left. No acute consolidative airspace disease or lung masses. Musculoskeletal: No aggressive appearing focal osseous lesions. Intact sternotomy wires. Generalized sclerosis in the thoracic osseous structures, similar, compatible with renal osteodystrophy. Review of the MIP images confirms the above findings. CTA ABDOMEN AND PELVIS FINDINGS VASCULAR Aorta: Atherosclerotic nonaneurysmal abdominal aorta with no dissection or significant stenosis. Celiac: Atherosclerotic without evidence of aneurysm, dissection, vasculitis or significant stenosis. SMA: Atherosclerotic without evidence of aneurysm, dissection or  vasculitis. There is irregular multifocal moderate to high-grade stenosis throughout the entire SMA. Renals: Coil embolization of left renal artery near the left renal sinus. Irregular high-grade stenosis of the proximal right renal artery. No pseudoaneurysm. IMA: Patent without evidence of aneurysm, dissection, vasculitis or significant stenosis. Inflow: Patent without evidence of aneurysm, dissection, vasculitis or significant stenosis. Veins: No obvious venous abnormality within the limitations of this arterial phase study. Review of the MIP images confirms the above findings. NON-VASCULAR Hepatobiliary: Normal liver size. Scattered subcentimeter hypodense liver lesions are too small to characterize and are unchanged, presumably benign. No new liver lesions. Normal gallbladder with no radiopaque cholelithiasis. No biliary ductal dilatation. Pancreas: Normal, with no mass or duct dilation. Spleen: Atrophic calcified spleen, unchanged.  No splenic masses. Adrenals/Urinary Tract: Normal adrenals. Symmetric severe atrophy  of kidneys bilaterally. No hydronephrosis. Numerous small simple bilateral renal cysts, largest 1.7 cm in the posterior lower right kidney. Hyperdense subcentimeter posterior lower left renal cortical lesion (series 6/image 96), requiring no follow-up. Bladder is completely collapsed and grossly normal. Stomach/Bowel: Moderate hiatal hernia. Otherwise normal nondistended stomach. Normal caliber small bowel with no small bowel wall thickening. Normal appendix. Moderate diffuse colonic diverticulosis with no large bowel wall thickening or significant pericolonic fat stranding. Vascular/Lymphatic: No pathologically enlarged lymph nodes in the abdomen or pelvis. Reproductive: Mildly enlarged myomatous uterus with coarsely calcified degenerated uterine fibroids, largest 2.3 cm, unchanged. No discrete adnexal masses. Other: No pneumoperitoneum. New large volume hemoperitoneum, most prominent in the  perihepatic space and anterior pelvic peritoneum surrounding the uterus and superior to the bladder. No evidence of active contrast extravasation. Musculoskeletal: No aggressive appearing focal osseous lesions. Diffuse sclerosis throughout abdominopelvic skeleton, similar, compatible with renal osteodystrophy. Mild lumbar spondylosis. Review of the MIP images confirms the above findings. IMPRESSION: 1. New large volume hemoperitoneum of unclear source, most prominent in the perihepatic space and anterior pelvic peritoneum surrounding the uterus and superior to the bladder. No evidence of active contrast extravasation. No acute aortic syndrome. 2. Atherosclerotic stenoses throughout the SMA and proximal right renal artery as detailed. Coil embolization of left renal artery near the left renal sinus. No pseudoaneurysm identified. 3. Moderate hiatal hernia. Mildly patulous thoracic esophagus with fluid level, suggesting esophageal dysmotility and/or gastroesophageal reflux. 4. A few new scattered indistinct small right pulmonary nodules, largest 4 mm, probably inflammatory. Recommend attention on follow-up chest CT in 3-6 months. 5. Moderate diffuse colonic diverticulosis. 6. Renal osteodystrophy. 7. Mildly enlarged myomatous uterus. 8. Aortic Atherosclerosis (ICD10-I70.0). Critical Value/emergent results were called by telephone at the time of interpretation on 07/14/2020 at 2:06 pm to provider [facility], who verbally acknowledged these results. Electronically Signed   By: Ilona Sorrel M.D.   On: 07/01/2020 14:09      Alexxis Mackert T. Hurley  If 7PM-7AM, please contact night-coverage [email or web address] 07/16/2020, 10:54 AM

## 2020-07-16 NOTE — Progress Notes (Signed)
Subjective/Chief Complaint: Became hypotensive in dialysis this am. No loc. Taken back to room. Awake and alert now. Hg 7 at 1 am. INR still 7.5   Objective: Vital signs in last 24 hours: Temp:  [93.8 F (34.3 C)-97.8 F (36.6 C)] 97.7 F (36.5 C) (02/26 0338) Pulse Rate:  [77-110] 88 (02/26 0338) Resp:  [15-33] 17 (02/26 0338) BP: (68-198)/(37-104) 107/71 (02/26 0338) SpO2:  [95 %-100 %] 95 % (02/26 0338)    Intake/Output from previous day: 02/25 0701 - 02/26 0700 In: 1910 [I.V.:1059; Blood:851] Out: 0  Intake/Output this shift: No intake/output data recorded.  General appearance: alert and cooperative Resp: clear to auscultation bilaterally Cardio: regular rate and rhythm GI: diffusely tender  Lab Results:  Recent Labs    07/07/2020 2307 07/16/20 0152  WBC 18.3* 22.6*  HGB 7.0* 7.0*  HCT 20.3* 21.6*  PLT 286 268   BMET Recent Labs    06/30/2020 2307 07/16/20 0152  NA 133* 135  K 5.3* 5.9*  CL 90* 90*  CO2 19* 18*  GLUCOSE 125* 79  BUN 90* 93*  CREATININE 9.46* 9.33*  CALCIUM 8.0* 8.1*   PT/INR Recent Labs    07/16/2020 1414 07/16/20 0152  LABPROT >90.0* 61.7*  INR >10.0* 7.5*   ABG No results for input(s): PHART, HCO3 in the last 72 hours.  Invalid input(s): PCO2, PO2  Studies/Results: CT Angio Chest/Abd/Pel for Dissection W and/or Wo Contrast  Result Date: 07/12/2020 CLINICAL DATA:  Severe lower abdominal pain and constipation for 2 days. History of left retroperitoneal hemorrhage status post coil embolization of left renal arteries 01/27/2020. End-stage renal disease on hemodialysis. EXAM: CT ANGIOGRAPHY CHEST, ABDOMEN AND PELVIS TECHNIQUE: Non-contrast CT of the chest was initially obtained. Multidetector CT imaging through the chest, abdomen and pelvis was performed using the standard protocol during bolus administration of intravenous contrast. Multiplanar reconstructed images and MIPs were obtained and reviewed to evaluate the vascular  anatomy. CONTRAST:  152mL OMNIPAQUE IOHEXOL 350 MG/ML SOLN COMPARISON:  01/26/2020 CT abdomen/pelvis. 10/30/2018 CT angiogram of the chest, abdomen and pelvis. FINDINGS: CTA CHEST FINDINGS Cardiovascular: Stable mild cardiomegaly. No significant pericardial effusion/thickening. Aortic and mitral valve prostheses in place. Left anterior descending coronary atherosclerosis. Atherosclerotic nonaneurysmal thoracic aorta. No acute intramural hematoma, dissection, pseudoaneurysm or penetrating atherosclerotic ulcer in the thoracic aorta. Top-normal caliber main pulmonary artery (3.3 cm diameter). No central pulmonary emboli. Mediastinum/Nodes: No discrete thyroid nodules. Mildly patulous thoracic esophagus with fluid level in mid to lower thoracic esophagus. No pathologically enlarged axillary, mediastinal or hilar lymph nodes. Lungs/Pleura: No pneumothorax. No pleural effusion. A few new scattered indistinct small right pulmonary nodules, largest 4 mm in the peripheral apical right upper lobe (series 8/image 31). Mild-to-moderate platelike atelectasis in the mid to lower lungs bilaterally, right greater than left. No acute consolidative airspace disease or lung masses. Musculoskeletal: No aggressive appearing focal osseous lesions. Intact sternotomy wires. Generalized sclerosis in the thoracic osseous structures, similar, compatible with renal osteodystrophy. Review of the MIP images confirms the above findings. CTA ABDOMEN AND PELVIS FINDINGS VASCULAR Aorta: Atherosclerotic nonaneurysmal abdominal aorta with no dissection or significant stenosis. Celiac: Atherosclerotic without evidence of aneurysm, dissection, vasculitis or significant stenosis. SMA: Atherosclerotic without evidence of aneurysm, dissection or vasculitis. There is irregular multifocal moderate to high-grade stenosis throughout the entire SMA. Renals: Coil embolization of left renal artery near the left renal sinus. Irregular high-grade stenosis of the  proximal right renal artery. No pseudoaneurysm. IMA: Patent without evidence of aneurysm, dissection, vasculitis or significant  stenosis. Inflow: Patent without evidence of aneurysm, dissection, vasculitis or significant stenosis. Veins: No obvious venous abnormality within the limitations of this arterial phase study. Review of the MIP images confirms the above findings. NON-VASCULAR Hepatobiliary: Normal liver size. Scattered subcentimeter hypodense liver lesions are too small to characterize and are unchanged, presumably benign. No new liver lesions. Normal gallbladder with no radiopaque cholelithiasis. No biliary ductal dilatation. Pancreas: Normal, with no mass or duct dilation. Spleen: Atrophic calcified spleen, unchanged.  No splenic masses. Adrenals/Urinary Tract: Normal adrenals. Symmetric severe atrophy of kidneys bilaterally. No hydronephrosis. Numerous small simple bilateral renal cysts, largest 1.7 cm in the posterior lower right kidney. Hyperdense subcentimeter posterior lower left renal cortical lesion (series 6/image 96), requiring no follow-up. Bladder is completely collapsed and grossly normal. Stomach/Bowel: Moderate hiatal hernia. Otherwise normal nondistended stomach. Normal caliber small bowel with no small bowel wall thickening. Normal appendix. Moderate diffuse colonic diverticulosis with no large bowel wall thickening or significant pericolonic fat stranding. Vascular/Lymphatic: No pathologically enlarged lymph nodes in the abdomen or pelvis. Reproductive: Mildly enlarged myomatous uterus with coarsely calcified degenerated uterine fibroids, largest 2.3 cm, unchanged. No discrete adnexal masses. Other: No pneumoperitoneum. New large volume hemoperitoneum, most prominent in the perihepatic space and anterior pelvic peritoneum surrounding the uterus and superior to the bladder. No evidence of active contrast extravasation. Musculoskeletal: No aggressive appearing focal osseous lesions. Diffuse  sclerosis throughout abdominopelvic skeleton, similar, compatible with renal osteodystrophy. Mild lumbar spondylosis. Review of the MIP images confirms the above findings. IMPRESSION: 1. New large volume hemoperitoneum of unclear source, most prominent in the perihepatic space and anterior pelvic peritoneum surrounding the uterus and superior to the bladder. No evidence of active contrast extravasation. No acute aortic syndrome. 2. Atherosclerotic stenoses throughout the SMA and proximal right renal artery as detailed. Coil embolization of left renal artery near the left renal sinus. No pseudoaneurysm identified. 3. Moderate hiatal hernia. Mildly patulous thoracic esophagus with fluid level, suggesting esophageal dysmotility and/or gastroesophageal reflux. 4. A few new scattered indistinct small right pulmonary nodules, largest 4 mm, probably inflammatory. Recommend attention on follow-up chest CT in 3-6 months. 5. Moderate diffuse colonic diverticulosis. 6. Renal osteodystrophy. 7. Mildly enlarged myomatous uterus. 8. Aortic Atherosclerosis (ICD10-I70.0). Critical Value/emergent results were called by telephone at the time of interpretation on 07/06/2020 at 2:06 pm to provider [facility], who verbally acknowledged these results. Electronically Signed   By: Ilona Sorrel M.D.   On: 06/22/2020 14:09    Anti-infectives: Anti-infectives (From admission, onward)   None      Assessment/Plan: s/p * No surgery found * still needs aggressive resuscitation with blood and ffp to correct coagulopathy  Would recommend transfer to ICU for more aggressive management May need IR consult for possible angio but will have to have coags corrected first K 5.9 and may go higher with transfusion. Renal will need to see to find another way to try to get K down if she can not tolerate HD right now Will follow  LOS: 1 day    Autumn Messing III 07/16/2020

## 2020-07-16 NOTE — Progress Notes (Signed)
[facility] Progress Note  Subjective: pt was unresponsive upon arrival to HD unit this am.  She rec'd fluid bolus, measured BP post was in the 100's. HD was not done. Pt returned to her room, plan is for ICU room soon.   Vitals:   07/16/20 0757 07/16/20 0812 07/16/20 1001 07/16/20 1016  BP: (!) 60/40 (!) 61/35 (!) 77/44 (!) 77/44  Pulse: 78 78 84 79  Resp: 17 18 18  (!) 21  Temp: (!) 93.3 F (34.1 C) (!) 93.3 F (34.1 C) (!) 94.3 F (34.6 C) (!) 94.4 F (34.7 C)  TempSrc: Rectal Rectal Rectal Oral  SpO2: 95% 95% 95% 96%    Exam: General: WDWN NAD Head: NCAT sclera not icteric MMM Lungs: CTA bilaterally. No wheeze, rales or rhonchi. Breathing is unlabored. Heart: RRR. No murmur, rubs or gallops.  Abdomen: soft, tender to palpation, +BS Lower extremities: no edema bilateral hips and lower extremities Neuro: AAOx3. Moves all extremities spontaneously. Dialysis Access: R AVF  OP HD: MWF [facility]  3h 35min  450/500  59.5kg  2/2 bath R AVF  Hep none  Mircera 60 mcg q2wks - last 07/08/20 Sensipar 180mg  with HD treatments- last 07/11/20 Anuryxia 210mg  3 tablets TID with meals  - Last Hgb 9.3 and Tsat 44 at OP unit  Assessment/Plan: 1. Abdominal pain/ hemoperitoneum- pt on coumadin w/ INR > 10. Hb down to 7, hypotensive episode this am. More prbc's ordered. Going to ICU after CT scan.  2. Hypotension: likely hemorrhagic shock. To get more FFP and prbc's in ICU.  3. Coagulopathy- INR > 10. Got 10mg  po vit K, DDAVP 20ug, and FFP x 2 yesterday. INR still up at 7.5 this am, got another vit K 10mg  po and FFP ordered.  4.  ESRD -  MWF. Missed 2/23 HD this week. Plan HD today in ICU, vs CRRT if shock persists.  5.  Hypertension/volume  - Blood pressures labile. No vol ^ on exam.  6.  Anemia of CKD - Acute on chronic blood loss. Last Micera 91mcg 2/18, next dose due on 07/22/20. transfuse prn. SP 3u prbc's as of this am.  7. Secondary Hyperparathyroidism - Last PO4 6.2 (OP HD). Will  continue Sensipar and Anuryxia 8. Nutrition - Advanced as tolerated to renal diet when clinically stable   Rob Ephrem Carrick 07/16/2020, 10:34 AM   Recent Labs  Lab 07/01/2020 2307 07/16/20 0152  K 5.3* 5.9*  BUN 90* 93*  CREATININE 9.46* 9.33*  CALCIUM 8.0* 8.1*  PHOS  --  8.8*  HGB 7.0* 7.0*   Inpatient medications: . sodium chloride   Intravenous Once  . sodium chloride   Intravenous Once  . [START ON Aug 12, 2020] calcitRIOL  2 mcg Oral Q M,W,F  . Chlorhexidine Gluconate Cloth  6 each Topical Q0600  . [START ON 2020/08/12] cinacalcet  180 mg Oral Q M,W,F-HD  . [START ON 07/22/2020] darbepoetin (ARANESP) injection - DIALYSIS  40 mcg Intravenous Q Fri-HD  . dextrose  25 g Intravenous Once  . ferric citrate  840 mg Oral TID   . sodium chloride    . sodium chloride    . dextrose 50 mL/hr at 07/16/20 0600  . piperacillin-tazobactam (ZOSYN)  IV     sodium chloride, sodium chloride, acetaminophen **OR** acetaminophen, alteplase, heparin, lidocaine (PF), lidocaine-prilocaine, ondansetron, pentafluoroprop-tetrafluoroeth

## 2020-07-16 NOTE — Progress Notes (Signed)
PCCM Brief interval progress note.   60 yo F ESRD, mechanical aortic and mitral valve replacement admitted 2/25 with abdominal pain- found to have large hemoperitoneum and significant coagulopathy. Transferred to ICU 2/26 AM for further resuscitation due to hypotension.  --- Seen in follow up in ICU SpO2 probe not reading. HR 81 RR 15 BP 106/65  Ill appearing middle aged F, reclined in bed NAD AAOx3 following commands PERRL NCAT poor dentition, slight scleral icterus  Diminished bibasilar sounds, shallow respirations, normal RR RRR cap refill < 3 sec radial pulse 1+  No acute joint deformity, cyanosis or clubbing --- CTA cap  personally reviewed, as well as discussed with Dr. Tacy Learn  and Dr. Anselm Pancoast.  Increased size of hemoperitoneum from prior study, etiology remains unclear. No appreciable extrav.  Significant esophageal distension, new  Significant interval increase in pulmonary opacities __  Suspected aspiration event -suspect coffee ground material is being aspirated -high risk for development of PNA and pneumonitis  Esophageal dilation  P -zosyn started this morning, continue -monitor s/sx for resp compromise -elevate HOB, aspiration precautions -NGT for decompression (yielding immediate coffee ground appearing return) -BID protonix   -defer GI consult   Hemoperitoneum, increasing -etiology remains unclear and no clear source -d/w Dr. Anselm Pancoast -- low suspicion this is r/t prior renal artery embolization  ABLA Coagulopathy  P -cont to provide supportive care -- correct coagulopathy, ABLA as needed. -f/u labs after PRBC and FFP  -worry that laying flat for additional imaging will result in further aspiration -- cont decompression as above. If further imaging becomes indicated could consider ?ct a/p non con or ?tagged RBC scan      Additional critical care time 48 minutes   Eliseo Gum MSN, AGACNP-BC [place] [phone] If no answer,  [phone] 07/16/2020, 11:07 AM

## 2020-07-16 NOTE — Progress Notes (Signed)
Pt noted transferred from 2W onto HD unit, as pt being rolled into bay, decreased responsiveness noted to verbal and tactile stimulation. Approximately 2 minutes later pt verbally responsive to verbal and tactile stimulation. However very sluggish and delayed responsiveness, responding with blank stares at times. Unable to obtain BP, pulse very faint, pt cold and clammy. Unable to obtain O2 sat. CBG-93. Rapid response intiated. Reports abdominal pain and shortness of breath. Non rebreather mask placed at 10liters at this time.

## 2020-07-16 NOTE — Plan of Care (Signed)

## 2020-07-16 NOTE — Progress Notes (Addendum)
NAME:  [name], MRN:  [id number], DOB:  1960-05-25, LOS: 1 ADMISSION DATE:  06/21/2020, CONSULTATION DATE:  07/10/2020 REFERRING MD:  Dr. Ron Parker, CHIEF COMPLAINT: Nausea, Constipation, Lower ABD Pain  Brief History:  60 yo F with ESRD, mechanical aortic and mitral valves, here with abdominal pain -- intra abdominal bleed (no active extrav) and INR >10.  PCCM ordered for coagulopathy correction, due to pt HDS and resp stability, admitted to [facility]  2/26, pt new AMS, lethargy hypotension, PCCM reconsutled   History of Present Illness:  60 y/o F PMH ESRD, mechanical Aortic and mitral valve replacement, who presented to [facility] on 2/25 with reports of lower abdominal pain, nausea, constipation.    The patient reports she has been constipated since 2/19 and has been using laxatives and stool softeners.  She ultimately had a few small bowel movements. She has been nauseated but no vomiting and has had lower abdominal pain.  She is known ESRD on HD (M/W/F) and went to HD on 2/21 but skipped 2/23 as she did not feel well. Her pain increased overnight into 2/25 which prompted her to seek ER evaluation.  Of note, she was admitted in 01/2020 for retroperitoneal hematoma / bleed s/p FFP, PRBC and renal embolization.    Initial ER evaluation notable for LA 8.6, INR > 10. 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 Given 2 FFP 10 Vit K 20 DDAVP for INR > 10, 1 PRBC for Hgb 8.7. Admitted to [facility]. Follow up Hgb 7  Without further correction.  2/26 Second repeat Hgb 7 without further correction. INR 7.5 without further correction. Hypotensive and altered in HD. CCM re-consulted   Consults:  CCS PCCM Procedures:     Significant Diagnostic Tests:  CT Chest / ABD / Pelvis 2/25 >> new large volume hemoperitoneum of unclear source, most prominent in the perihepatic space and the anterior pelvic peritoneum surrounding the uterus and superior to the bladder.   2/26 STAT CTA c/a/p >>>  Micro Data:  COVID 2/25 >> negative  Influenza A/B 2/25 >> negative   Antimicrobials:    Interim History / Subjective:  Received 1 PRBC yesterday for Hgb 8.7.  Received 10 Vit K, 2 FFP, 20 DDAVP yesterday for INR >10 0200 Hgb 7, INR 7.5 -- no further correction ordered overnight    Has been intermittently hypoglycemic -- started on dextrose infusion  WBC has increased from 15.8 > 18 > 22 AG has increased from 24 > 27  Looks like BP has downtrended overnight from SBP 150  > 100   This morning, went to HD and had hypotensive BP readings + increasing lethargy and thready pulse  On CCM arrival NIBP adjusted, SBP now 100. Lethargic but mentation ok.     Objective   Blood pressure 107/71, pulse 88, temperature 97.7 F (36.5 C), temperature source Oral, resp. rate 17, last menstrual period 12/05/2010, SpO2 95 %.        Intake/Output Summary (Last 24 hours) at 07/16/2020 0739 Last data filed at 07/16/2020 0600 Gross per 24 hour  Intake 1909.98 ml  Output 0 ml  Net 1909.98 ml   There were no vitals filed for this visit.  Examination:  General: Acutely and chronically ill appearing middle aged F, appears older than stated 60.  HEENT: NCAT slight scleral icterus. Poor dentition  Neuro: Lethargic, awake, oriented x2 following commands  CV: RRR -- mechanical aortic and mitral valve sounds. 1+ pulses  Pulm: Symmetrical chest expansion, even unlabored respirations, increased RR  GI: Round Slightly firm but not tense. Generalized tenderness without rebound tenderness.  Extremities: Upper extremity AVF. No acute joint deformity, no cyanosis or clubbing  Skin: c/d cool to touch. Some bruising on L hip    Resolved  Hospital Problem list     Assessment & Plan:   Acute encephalopathy -suspect r/t intra-abdominal process below, but has been hypoglycemic which may be contributing -did also get dilaudid throughout the night, last given approx 0500  P -hypoglycemia as below -hemorrhagic shock as below -leukocytosis as below  -fq neuro exams -dc dilaudid   Large volume hemoperitoneum  -no evidence of active extrav on CT 2/25, unclear etiology - evaluated by CCS -- agree that surgical intervention would be last resort w/o known source of bleed  Hypotension, concern for hemorrhagic shock -SBPs low in HD 2/26, but variable, with latest SBP 100. Hgb has dropped from 8.7 to 7 (resulted 6 hours ago, very likely that hgb is lower at time of re-consult) concerning for active bleed, possible hemorrhagic shock  P  -STAT CT angio, IR consulted by [facility] 2/26 AM  -3 PRBC, 2 FFP  -transfer to ICU  -may need art line if difficult to maintain accurate NIBP   Coagulopathy  -coumadin at home for aortic and mitral valve.  Last check was about 2 weeks ago, INR was 3.7, pt reportedly instructed to decrease coumadin to 2x/week which she states she is compliant -10mg  Vit K, 2 FFP, 52mcg DDAVP given 2/25 for INR >10  -follow up INR 7.5 2/26 0200  Transaminitis, mild -new from admission labs, presented w abd pain (? If significant APAP use?)  P -2 FFP 2/26 -Trend INR, LFTs -STAT APAP level  -hold coumadin   Hypoglycemia -on D10 -concerning for hepatic process in setting of above  P -increase d10 to 75 ml/hr -acute hypoglycemic correction as per protocol  Acute on chronic anemia (Acute - ABLA, hemoperitoneum, possible hemorrhagic shock) (chronic- ESRD) P - 3 PRBC as above, trend CBC transfuse as needed - ferric citrate   ESRD  -missed HD this week Hyperkalemia P -HD per nephro -pending hemodynamics, may need CRRT. Hope to do HD and minimize invasive procedures at this point with significant coagulopathy    S/p aortic and mitral valve replacement  -On coumadin  P -hold coumadin -trend INR  Lactic acidosis, improving  AGMA, worse  Leukocytosis, worse -? Reactive vs infectious P -trend WBC, fever curve  -start zosyn -- c/f possible intra-abdominal process   Hx HTN  -on metop at home P -hold in setting of shock   Polysubstance use  -uses marijuana regularly, sometimes cocaine P -will need cessation counseling when appropriate    Best practice (evaluated daily)  Diet: NPO Pain/Anxiety/Delirium protocol (if indicated): na VAP protocol (if indicated): na DVT prophylaxis: hold chemical vte ppx  GI prophylaxis: per primary -- takes protonix at home  Glucose control: d10  Mobility: br Disposition: transfer to ICU   Goals of Care:  Last date of multidisciplinary goals of care discussion: per primary  Family and staff present: -- Summary of discussion: -- Follow up goals of care discussion due: 3/5 Code Status: full  Labs   CBC: Recent  Labs  Lab 07/05/2020 1050 07/12/2020 2307 07/16/20 0152  WBC 15.8* 18.3* 22.6*  NEUTROABS 11.8*  --   --   HGB 8.7* 7.0* 7.0*  HCT 27.8* 20.3* 21.6*  MCV 103.0* 98.5 102.4*  PLT 413* 286 976    Basic Metabolic Panel: Recent Labs  Lab 06/23/2020 1050 07/11/2020 2307 07/16/20 0152  NA 132* 133* 135  K 5.1 5.3* 5.9*  CL 88* 90* 90*  CO2 20* 19* 18*  GLUCOSE 119* 125* 79  BUN 85* 90* 93*  CREATININE 9.37* 9.46* 9.33*  CALCIUM 8.7* 8.0* 8.1*  MG  --   --  2.3  PHOS  --   --  8.8*   GFR: CrCl cannot be calculated (Unknown ideal weight.). Recent Labs  Lab 07/17/2020 1050 07/06/2020 1412 07/01/2020 2307 07/16/20 0152  WBC 15.8*  --  18.3* 22.6*  LATICACIDVEN  --  8.6* 4.1*  --     Liver Function Tests: Recent Labs  Lab 07/09/2020 1050 07/08/2020 2307  AST 23 128*  ALT 22 69*  ALKPHOS 164* 115  BILITOT 0.8 0.8  PROT 6.9 5.8*  ALBUMIN 2.7* 2.4*   Recent Labs  Lab 06/29/2020 1050  LIPASE 20   No results for input(s):  AMMONIA in the last 168 hours.  ABG    Component Value Date/Time   PHART 7.264 (L) 12/02/2018 1829   PCO2ART 56.3 (H) 12/02/2018 1829   PO2ART 86.0 12/02/2018 1829   HCO3 25.8 12/02/2018 1829   TCO2 28 12/02/2018 1829   ACIDBASEDEF 2.0 12/02/2018 1829   O2SAT 95.0 12/02/2018 1829     Coagulation Profile: Recent Labs  Lab 07/13/2020 1414 07/16/20 0152  INR >10.0* 7.5*    Cardiac Enzymes: No results for input(s): CKTOTAL, CKMB, CKMBINDEX, TROPONINI in the last 168 hours.  HbA1C: Hgb A1c MFr Bld  Date/Time Value Ref Range Status  06/29/2020 10:50 AM 5.4 4.8 - 5.6 % Final    Comment:    (NOTE) Pre diabetes:          5.7%-6.4%  Diabetes:              >6.4%  Glycemic control for   <7.0% adults with diabetes   11/27/2018 12:25 PM 4.9 4.8 - 5.6 % Final    Comment:    (NOTE)         Prediabetes: 5.7 - 6.4         Diabetes: >6.4         Glycemic control for adults with diabetes: <7.0     CBG: Recent Labs  Lab 07/16/20 0213 07/16/20 0227 07/16/20 0241 07/16/20 0336 07/16/20 0610  GLUCAP 31* 58* 134* 131* 76    CRITICAL CARE Performed by: Cristal Generous   Total critical care time: 64 minutes  Critical care time was exclusive of separately billable procedures and treating other patients. Critical care was necessary to treat or prevent imminent or life-threatening deterioration.  Critical care was time spent personally by me on the following activities: development of treatment plan with patient and/or surrogate as well as nursing, discussions with consultants, evaluation of patient's response to treatment, examination of patient, obtaining history from patient or surrogate, ordering and performing treatments and interventions, ordering and review of laboratory studies, ordering and review of radiographic studies, pulse oximetry and re-evaluation of patient's condition.  Eliseo Gum MSN, AGACNP-BC [place] [phone] If no  answer, [phone] 07/16/2020, 7:39 AM

## 2020-07-17 ENCOUNTER — Inpatient Hospital Stay (HOSPITAL_COMMUNITY): Payer: Medicare Other

## 2020-07-17 DIAGNOSIS — Z992 Dependence on renal dialysis: Secondary | ICD-10-CM | POA: Diagnosis not present

## 2020-07-17 DIAGNOSIS — N186 End stage renal disease: Secondary | ICD-10-CM | POA: Diagnosis not present

## 2020-07-17 DIAGNOSIS — I469 Cardiac arrest, cause unspecified: Secondary | ICD-10-CM | POA: Diagnosis not present

## 2020-07-17 LAB — CBC
HCT: 22.6 % — ABNORMAL LOW (ref 36.0–46.0)
HCT: 18.3 % — ABNORMAL LOW (ref 36.0–46.0)
Hemoglobin: 8.1 g/dL — ABNORMAL LOW (ref 12.0–15.0)
Hemoglobin: 6.6 g/dL — CL (ref 12.0–15.0)
MCH: 32.9 pg (ref 26.0–34.0)
MCH: 33.8 pg (ref 26.0–34.0)
MCHC: 35.8 g/dL (ref 30.0–36.0)
MCHC: 36.1 g/dL — ABNORMAL HIGH (ref 30.0–36.0)
MCV: 91.9 fL (ref 80.0–100.0)
MCV: 93.8 fL (ref 80.0–100.0)
Platelets: 149 10*3/uL — ABNORMAL LOW (ref 150–400)
Platelets: 132 10*3/uL — ABNORMAL LOW (ref 150–400)
RBC: 2.46 MIL/uL — ABNORMAL LOW (ref 3.87–5.11)
RBC: 1.95 MIL/uL — ABNORMAL LOW (ref 3.87–5.11)
RDW: 16 % — ABNORMAL HIGH (ref 11.5–15.5)
RDW: 16.7 % — ABNORMAL HIGH (ref 11.5–15.5)
WBC: 35.3 10*3/uL — ABNORMAL HIGH (ref 4.0–10.5)
WBC: 36.9 10*3/uL — ABNORMAL HIGH (ref 4.0–10.5)
nRBC: 1.4 % — ABNORMAL HIGH (ref 0.0–0.2)
nRBC: 1.1 % — ABNORMAL HIGH (ref 0.0–0.2)

## 2020-07-17 LAB — TYPE AND SCREEN
ABO/RH(D): A NEG
Antibody Screen: NEGATIVE
Unit division: 0
Unit division: 0
Unit division: 0

## 2020-07-17 LAB — COMPREHENSIVE METABOLIC PANEL
ALT: 73 U/L — ABNORMAL HIGH (ref 0–44)
AST: 92 U/L — ABNORMAL HIGH (ref 15–41)
Albumin: 1.8 g/dL — ABNORMAL LOW (ref 3.5–5.0)
Alkaline Phosphatase: 92 U/L (ref 38–126)
Anion gap: 32 — ABNORMAL HIGH (ref 5–15)
BUN: 48 mg/dL — ABNORMAL HIGH (ref 6–20)
CO2: 31 mmol/L (ref 22–32)
Calcium: 7.3 mg/dL — ABNORMAL LOW (ref 8.9–10.3)
Chloride: 83 mmol/L — ABNORMAL LOW (ref 98–111)
Creatinine, Ser: 4.73 mg/dL — ABNORMAL HIGH (ref 0.44–1.00)
GFR, Estimated: 10 mL/min — ABNORMAL LOW (ref >60)
Glucose, Bld: 147 mg/dL — ABNORMAL HIGH (ref 70–99)
Potassium: 5.2 mmol/L — ABNORMAL HIGH (ref 3.5–5.1)
Sodium: 146 mmol/L — ABNORMAL HIGH (ref 135–145)
Total Bilirubin: 0.7 mg/dL (ref 0.3–1.2)
Total Protein: 4.7 g/dL — ABNORMAL LOW (ref 6.5–8.1)

## 2020-07-17 LAB — RENAL FUNCTION PANEL
Albumin: 2.3 g/dL — ABNORMAL LOW (ref 3.5–5.0)
Anion gap: 20 — ABNORMAL HIGH (ref 5–15)
BUN: 49 mg/dL — ABNORMAL HIGH (ref 6–20)
CO2: 23 mmol/L (ref 22–32)
Calcium: 8.2 mg/dL — ABNORMAL LOW (ref 8.9–10.3)
Chloride: 89 mmol/L — ABNORMAL LOW (ref 98–111)
Creatinine, Ser: 5.12 mg/dL — ABNORMAL HIGH (ref 0.44–1.00)
GFR, Estimated: 9 mL/min — ABNORMAL LOW (ref >60)
Glucose, Bld: 110 mg/dL — ABNORMAL HIGH (ref 70–99)
Phosphorus: 5.5 mg/dL — ABNORMAL HIGH (ref 2.5–4.6)
Potassium: 4.7 mmol/L (ref 3.5–5.1)
Sodium: 132 mmol/L — ABNORMAL LOW (ref 135–145)

## 2020-07-17 LAB — BPAM FFP
Blood Product Expiration Date: 202203022359
Blood Product Expiration Date: 202203022359
Blood Product Expiration Date: 202203032359
ISSUE DATE / TIME: 202202261254
ISSUE DATE / TIME: 202202261412
ISSUE DATE / TIME: 202202261412
Unit Type and Rh: 600
Unit Type and Rh: 6200
Unit Type and Rh: 6200

## 2020-07-17 LAB — PROTIME-INR
INR: 4.7 (ref 0.8–1.2)
INR: 3.7 — ABNORMAL HIGH (ref 0.8–1.2)
INR: 4 — ABNORMAL HIGH (ref 0.8–1.2)
INR: 3.9 — ABNORMAL HIGH (ref 0.8–1.2)
INR: 6.5 (ref 0.8–1.2)
Prothrombin Time: 42.8 seconds — ABNORMAL HIGH (ref 11.4–15.2)
Prothrombin Time: 35.8 seconds — ABNORMAL HIGH (ref 11.4–15.2)
Prothrombin Time: 37.7 seconds — ABNORMAL HIGH (ref 11.4–15.2)
Prothrombin Time: 36.9 seconds — ABNORMAL HIGH (ref 11.4–15.2)
Prothrombin Time: 54.9 seconds — ABNORMAL HIGH (ref 11.4–15.2)

## 2020-07-17 LAB — LACTIC ACID, PLASMA: Lactic Acid, Venous: >11 mmol/L (ref 0.5–1.9)

## 2020-07-17 LAB — PREPARE FRESH FROZEN PLASMA: Unit division: 0

## 2020-07-17 LAB — POCT I-STAT 7, (LYTES, BLD GAS, ICA,H+H)
Acid-base deficit: 10 mmol/L — ABNORMAL HIGH (ref 0.0–2.0)
Bicarbonate: 16 mmol/L — ABNORMAL LOW (ref 20.0–28.0)
Calcium, Ion: 1.01 mmol/L — ABNORMAL LOW (ref 1.15–1.40)
HCT: 19 % — ABNORMAL LOW (ref 36.0–46.0)
Hemoglobin: 6.5 g/dL — CL (ref 12.0–15.0)
O2 Saturation: 100 %
Patient temperature: 96.6
Potassium: 5.5 mmol/L — ABNORMAL HIGH (ref 3.5–5.1)
Sodium: 128 mmol/L — ABNORMAL LOW (ref 135–145)
TCO2: 17 mmol/L — ABNORMAL LOW (ref 22–32)
pCO2 arterial: 33.5 mmHg (ref 32.0–48.0)
pH, Arterial: 7.281 — ABNORMAL LOW (ref 7.350–7.450)
pO2, Arterial: 194 mmHg — ABNORMAL HIGH (ref 83.0–108.0)

## 2020-07-17 LAB — BPAM RBC
Blood Product Expiration Date: 202204012359
Blood Product Expiration Date: 202204012359
Blood Product Expiration Date: 202204012359
ISSUE DATE / TIME: 202202260743
ISSUE DATE / TIME: 202202260743
ISSUE DATE / TIME: 202202260743
Unit Type and Rh: 600
Unit Type and Rh: 600
Unit Type and Rh: 600

## 2020-07-17 LAB — GLUCOSE, CAPILLARY
Glucose-Capillary: 106 mg/dL — ABNORMAL HIGH (ref 70–99)
Glucose-Capillary: 111 mg/dL — ABNORMAL HIGH (ref 70–99)
Glucose-Capillary: 107 mg/dL — ABNORMAL HIGH (ref 70–99)
Glucose-Capillary: 70 mg/dL (ref 70–99)
Glucose-Capillary: 83 mg/dL (ref 70–99)
Glucose-Capillary: 77 mg/dL (ref 70–99)
Glucose-Capillary: 70 mg/dL (ref 70–99)

## 2020-07-17 LAB — PREPARE RBC (CROSSMATCH)

## 2020-07-17 LAB — HEPATITIS PANEL, ACUTE
HCV Ab: NONREACTIVE
Hep A IgM: NONREACTIVE
Hep B C IgM: NONREACTIVE
Hepatitis B Surface Ag: NONREACTIVE

## 2020-07-17 LAB — TROPONIN I (HIGH SENSITIVITY): Troponin I (High Sensitivity): 306 ng/L (ref <18)

## 2020-07-17 MED ORDER — PANCRELIPASE (LIP-PROT-AMYL) 10440-39150 UNITS PO TABS
20880.0000 [IU] | ORAL_TABLET | Freq: Once | ORAL | Status: DC
  Filled 2020-07-17: qty 2

## 2020-07-17 MED ORDER — PIPERACILLIN-TAZOBACTAM 3.375 G IVPB
3.3750 g | Freq: Two times a day (BID) | INTRAVENOUS | Status: DC
  Administered 2020-07-17: 3.375 g via INTRAVENOUS
  Filled 2020-07-17 (×2): qty 50

## 2020-07-17 MED ORDER — POTASSIUM CHLORIDE 10 MEQ/100ML IV SOLN
10.0000 meq | INTRAVENOUS | Status: AC
  Administered 2020-07-17 (×3): 10 meq via INTRAVENOUS
  Filled 2020-07-17 (×3): qty 100

## 2020-07-17 MED ORDER — SODIUM CHLORIDE 0.9% IV SOLUTION: Freq: Once | INTRAVENOUS | Status: AC

## 2020-07-17 MED ORDER — SODIUM BICARBONATE 8.4 % IV SOLN
INTRAVENOUS | Status: AC
  Filled 2020-07-17: qty 100

## 2020-07-17 MED ORDER — SODIUM CHLORIDE 0.9 % IV SOLN
20.0000 ug | INTRAVENOUS | Status: AC
  Administered 2020-07-17: 20 ug via INTRAVENOUS
  Filled 2020-07-17: qty 5

## 2020-07-17 MED ORDER — ONDANSETRON HCL 4 MG/2ML IJ SOLN
4.0000 mg | Freq: Four times a day (QID) | INTRAMUSCULAR | Status: DC | PRN
  Administered 2020-07-17: 4 mg via INTRAVENOUS
  Filled 2020-07-17: qty 2

## 2020-07-17 MED ORDER — VANCOMYCIN VARIABLE DOSE PER UNSTABLE RENAL FUNCTION (PHARMACIST DOSING): Status: DC

## 2020-07-17 MED ORDER — METHYLPREDNISOLONE SODIUM SUCC 125 MG IJ SOLR
125.0000 mg | INTRAMUSCULAR | Status: AC
  Administered 2020-07-17: 125 mg via INTRAVENOUS
  Filled 2020-07-17: qty 2

## 2020-07-17 MED ORDER — DIPHENHYDRAMINE HCL 50 MG/ML IJ SOLN
25.0000 mg | Freq: Once | INTRAMUSCULAR | Status: AC
  Administered 2020-07-17: 25 mg via INTRAVENOUS
  Filled 2020-07-17: qty 1

## 2020-07-17 MED ORDER — HYDROCORTISONE NA SUCCINATE PF 100 MG IJ SOLR
50.0000 mg | Freq: Four times a day (QID) | INTRAMUSCULAR | Status: DC
  Administered 2020-07-18: 50 mg via INTRAVENOUS
  Filled 2020-07-17: qty 2

## 2020-07-17 MED ORDER — SODIUM CHLORIDE 0.9% IV SOLUTION: Freq: Once | INTRAVENOUS | Status: DC

## 2020-07-17 MED ORDER — ACETAMINOPHEN 160 MG/5ML PO SOLN: 650.0000 mg | Freq: Four times a day (QID) | ORAL | Status: DC | PRN

## 2020-07-17 MED ORDER — DARBEPOETIN ALFA 60 MCG/0.3ML IJ SOSY: 60.0000 ug | PREFILLED_SYRINGE | INTRAMUSCULAR | Status: DC

## 2020-07-17 MED ORDER — SODIUM BICARBONATE 650 MG PO TABS
650.0000 mg | ORAL_TABLET | Freq: Once | ORAL | Status: DC
  Filled 2020-07-17: qty 1

## 2020-07-17 MED ORDER — CALCIUM GLUCONATE 10 % IV SOLN
1.0000 g | INTRAVENOUS | Status: AC
  Administered 2020-07-17: 1 g via INTRAVENOUS
  Filled 2020-07-17: qty 10

## 2020-07-17 MED ORDER — NOREPINEPHRINE 4 MG/250ML-% IV SOLN
INTRAVENOUS | Status: AC
  Filled 2020-07-17: qty 250

## 2020-07-17 MED ORDER — VANCOMYCIN HCL 1500 MG/300ML IV SOLN
1500.0000 mg | Freq: Once | INTRAVENOUS | Status: AC
  Administered 2020-07-18: 1500 mg via INTRAVENOUS
  Filled 2020-07-17: qty 300

## 2020-07-17 MED ORDER — NOREPINEPHRINE 4 MG/250ML-% IV SOLN: 0.0000 ug/min | INTRAVENOUS | Status: DC

## 2020-07-17 MED ORDER — SODIUM BICARBONATE 8.4 % IV SOLN
INTRAVENOUS | Status: DC
  Filled 2020-07-17 (×3): qty 850

## 2020-07-17 MED ORDER — NOREPINEPHRINE 16 MG/250ML-% IV SOLN
0.0000 ug/min | INTRAVENOUS | Status: DC
  Administered 2020-07-18: 30 ug/min via INTRAVENOUS
  Filled 2020-07-17: qty 250

## 2020-07-17 MED ORDER — VASOPRESSIN 20 UNITS/100 ML INFUSION FOR SHOCK
0.0000 [IU]/min | INTRAVENOUS | Status: DC
  Administered 2020-07-18 (×2): 0.04 [IU]/min via INTRAVENOUS
  Filled 2020-07-17: qty 100

## 2020-07-17 NOTE — Progress Notes (Signed)
[facility] Progress Note Patient Name: [name] DOB: 09-18-1960 MRN: [id number]   Date of Service  07/17/2020  HPI/Events of Note  Multiple issues: 1. Anemia - Hgb = 6.6 and 2. Elevated INR - INR = 3.9. Goal INR < 2.0.   eICU Interventions  Plan: 1. Transfuse 1 unit PRBC and 2 units FFP now.  2. Repeat INR at 1 AM.     Intervention Category Major Interventions: Other:  Lysle Dingwall 07/17/2020, 8:28 PM

## 2020-07-17 NOTE — Progress Notes (Addendum)
Pharmacy Antibiotic Note  [name] is a 60 y.o. female with h/o  ESRD and mechanical MVR, admitted with GI bleed, hypotension and possible sepsis Pharmacy has been consulted for Vancomycin  dosing.  Plan: Vancomycin 1500 mg IV now F/U plan for hemodialysis and redose as indicated.  Height: 5\' 6"  (167.6 cm) Weight: 68 kg (149 lb 14.6 oz) IBW/kg (Calculated) : 59.3  Temp (24hrs), Avg:97.4 F (36.3 C), Min:96.5 F (35.8 C), Max:98.2 F (36.8 C)  Recent Labs  Lab 06/27/2020 1412 07/07/2020 2307 07/16/20 0152 07/16/20 1647 07/16/20 2002 07/17/20 1133 07/17/20 1910 07/17/20 2149 07/17/20 2215  WBC  --  18.3* 22.6* 18.0*  --  35.3* 36.9*  --   --   CREATININE  --  9.46* 9.33* 2.78*  --  5.12*  --  4.73*  --   LATICACIDVEN 8.6* 4.1*  --  2.8* 4.6*  --   --   --  >11.0*    Estimated Creatinine Clearance: 12.1 mL/min (A) (by C-G formula based on SCr of 4.73 mg/dL (H)).    No Known Allergies   Caryl Pina 07/17/2020 11:02 PM

## 2020-07-17 NOTE — Progress Notes (Signed)
[facility] Progress Note Patient Name: [name] DOB: [birth year] MRN: [id number]   Date of Service  07/17/2020  HPI/Events of Note  Coagulopathy - INR = 2.8 --> 4.7  eICU Interventions  Plan: 1. Transfuse 2 units FFP now. 2. Repeat INR at 8 AM.      Intervention Category Major Interventions: Other:  Rodd Heft Cornelia Copa 07/17/2020, 4:04 AM

## 2020-07-17 NOTE — Progress Notes (Signed)
NAME:  [name], MRN:  [id number], DOB:  1960-10-23, LOS: 2 ADMISSION DATE:  07/14/2020, CONSULTATION DATE:  06/27/2020 REFERRING MD:  Dr. Ron Parker, CHIEF COMPLAINT: Nausea, Constipation, Lower ABD Pain  Brief History:  60 yo F with ESRD, mechanical aortic and mitral valves, here with abdominal pain -- intra abdominal bleed (no active extrav) and INR >10.  PCCM ordered for coagulopathy correction, due to pt HDS and resp stability, admitted to [facility]  2/26, pt new AMS, lethargy hypotension, PCCM reconsutled   History of Present Illness:  60 y/o F PMH ESRD, mechanical Aortic and mitral valve replacement, who presented to [facility] on 2/25 with reports of lower abdominal pain, nausea, constipation.    The patient reports she has been constipated since 2/19 and has been using laxatives and stool softeners.  She ultimately had a few small bowel movements. She has been nauseated but no vomiting and has had lower abdominal pain.  She is known ESRD on HD (M/W/F) and went to HD on 2/21 but skipped 2/23 as she did not feel well. Her pain increased overnight into 2/25 which prompted her to seek ER evaluation.  Of note, she was admitted in 01/2020 for retroperitoneal hematoma / bleed s/p FFP, PRBC and renal embolization.    Initial ER evaluation notable for LA 8.6, INR > 10. 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 Given 2 FFP 10 Vit K 20 DDAVP for INR > 10, 1 PRBC for Hgb 8.7. Admitted to [facility]. Follow up Hgb 7  Without further correction.  2/26 Second repeat Hgb 7 without further correction. INR 7.5 without further correction. Hypotensive and altered in HD. CCM re-consulted   Consults:  [facility] PCCM Procedures:     Significant Diagnostic Tests:  CT Chest / ABD / Pelvis 2/25 >> new large volume hemoperitoneum of unclear source, most prominent in the perihepatic space and the anterior pelvic peritoneum surrounding the uterus and superior to the bladder.   2/26 STAT CTA c/a/p > increased hemoperitoneum, unclear etiology. No active extrav. Severe esophageal distention, moderate stomach distention. Increase in bilateral lung consolidation, concerning for aspiration   Micro Data:  COVID 2/25 >> negative  Influenza A/B 2/25 >> negative   Antimicrobials:  2/26 zosyn>  Interim History / Subjective:  NGT placed, immediate return of coffee ground emesis   INR improved to 2.8 after resusc yesterday, then increased to 4.7 -- 2 FFP ordered   K is 2.9 this morning, replacement ordered by nephrology  States she "feels a lot better" Definitely is more awake and interactive this morning Sats are 100%,but pt asks to keep NRB for comfort.   Objective   Blood pressure 106/63, pulse (!) 107, temperature 97.8 F (36.6 C), temperature source Oral, resp. rate 18, height 5\' 6"  (1.676 m), weight 68 kg, last menstrual period 12/05/2010, SpO2 100 %.        Intake/Output Summary (Last 24 hours) at 07/17/2020 0823 Last data filed at 07/17/2020 0650 Gross per 24 hour  Intake 4119.38 ml  Output 1500 ml  Net 2619.38 ml   Filed Weights   07/17/20 0550  Weight: 68 kg    Examination: General: chronically and acutely ill appearing middle aged F,  appears older than stated age. Reclined in bed NAD HEENT: NCAT. NRB malpositioned on face. Pink mm, trachea midline.  Neuro: AAOx3 following commands. PERRL CV: tachycardic rate reg rhythm aortic and mitral valve mechanical click. Capo refill < 3 sec  Pulm: Diminished bibasilar sounds, Symmetrical chest expansion, even and unlabored GI: Round, slightly firm, not tense. Generalized discomfort without focal tenderness or rebound tenderness. Extremities: Upper extremity AVf.  No acute joint deformity, no cyanosis or clubbing  Skin: c/d/w no rash   Resolved Hospital Problem list     Assessment & Plan:   Acute encephalopathy, improving -suspect due to hemorrhagic shock, hypoxia, hypoglycemia  P -delirium precautions -minimize CNS depressing agents as able  -hypoglycemia, hypoxia, hemorrhagic shock as below   Acute respiratory failure with hypoxia, improving Aspiration event  -likely of coffee ground emesis -high risk for PNA, and pneumonitis given contents of suspected aspiration P -Aspiration precautions, elevate HOB -Wean O2 support as able, goal > 92%  -IS  -cont zosyn   Large volume hemoperitoneum -increasing in size from 2/25 to 2/26  -no evidence of active extrav, unclear etiology Hemorrhagic shock, improving   GIB  -severe esophageal distention on CT -- large volume coffee ground emesis returned upon NGT to suction  P  -40mg  protonix BID -- at some point, GI consult -Cont NGT to LIWS  -D/w Dr. Marlou Starks [facility]CS -- agree w/ supportive care in absence of identifiable etiogy of bleed. -Trend CBC- likely will need plt transfusion today given volume of total blood loss, likelihood of chronic plt dysfunction in setting of ESRD. Has received DDAVP.   Coagulopathy  -coumadin at home for aortic and mitral valve. Was supratherapeutic at last clinic visit, med fq adjusted at that time. -10mg  Vit K, 2 FFP, 86mcg DDAVP given 2/25 for INR >10  -2/26 given 3 PRBC, 2 FFP -2/27 INR incr from 2.8 to 4.7, additional 2 FFP Transaminitis, mild  -APAP wnl P -getting 2 FFP 2/26  -trend INR, LFTs   -supportive care   Hypoglycemia -concerning for hepatic process in setting of above but LFTs arent P -increase d10 to 75 ml/hr -acute hypoglycemic correction as per protocol  Acute on chronic anemia (Acute - ABLA, hemoperitoneum, possible hemorrhagic shock) (chronic- ESRD) P - transfuse as need as above  - ferric citrate     ESRD  -missed HD this week Hyper  / Hypokalemia P -HD per neprho -KCl ordered per nephro  S/p aortic and mitral valve replacement -On coumadin  P -hold coumadin -trend INR -- goal 2.5-3.5  Lactic acidosis AGMA -improving -improvement after Hd 2/26 Leukocytosis -? Reactive vs infectious P - cont zosyn (picked for possible intra ab process, but certainly provides good coverage for pulm process)  Hx HTN -on metop at home P - ICU monitoring - holding home antihypertensives   Polysubstance use -uses marijuana regularly, sometimes cocaine P -TOC    Best practice (evaluated daily)  Diet: NPO Pain/Anxiety/Delirium protocol (if indicated): na VAP protocol (if indicated): na DVT prophylaxis: hold chemical vte ppx  GI prophylaxis: BID protonix  Glucose control: d10  Mobility: PT  Disposition: ICU   Goals of Care:  Last date of multidisciplinary goals of care discussion: per primary  Family and staff present: -- Summary of discussion: -- Follow up goals of care discussion due: 3/5 Code Status: full  Labs   CBC: Recent Labs  Lab 07/04/2020 1050 07/11/2020 2307 07/16/20 0152 07/16/20 1647  WBC 15.8* 18.3* 22.6* 18.0*  NEUTROABS 11.8*  --   --   --  HGB 8.7* 7.0* 7.0* 9.0*  HCT 27.8* 20.3* 21.6* 25.1*  MCV 103.0* 98.5 102.4* 89.6  PLT 413* 286 268 956    Basic Metabolic Panel: Recent Labs  Lab 06/27/2020 1050 07/03/2020 2307 07/16/20 0152 07/16/20 1647  NA 132* 133* 135 136  K 5.1 5.3* 5.9* 2.9*  CL 88* 90* 90* 94*  CO2 20* 19* 18* 25  GLUCOSE 119* 125* 79 98  BUN 85* 90* 93* 25*  CREATININE 9.37* 9.46* 9.33* 2.78*  CALCIUM 8.7* 8.0* 8.1* 8.1*  MG  --   --  2.3  --   PHOS  --   --  8.8*  --    GFR: Estimated Creatinine Clearance: 20.6 mL/min (A) (by C-G formula based on SCr of 2.78 mg/dL (H)). Recent Labs  Lab 07/14/2020 1050 07/17/2020 1412 07/02/2020 2307 07/16/20 0152 07/16/20 1647 07/16/20 2002  WBC 15.8*  --  18.3* 22.6* 18.0*  --   LATICACIDVEN  --  8.6* 4.1*  --  2.8* 4.6*     Liver Function Tests: Recent Labs  Lab 06/30/2020 1050 06/29/2020 2307 07/16/20 1647  AST 23 128* 207*  ALT 22 69* 137*  ALKPHOS 164* 115 103  BILITOT 0.8 0.8 0.8  PROT 6.9 5.8* 6.4*  ALBUMIN 2.7* 2.4* 2.6*   Recent Labs  Lab 07/11/2020 1050  LIPASE 20   No results for input(s): AMMONIA in the last 168 hours.  ABG    Component Value Date/Time   PHART 7.264 (L) 12/02/2018 1829   PCO2ART 56.3 (H) 12/02/2018 1829   PO2ART 86.0 12/02/2018 1829   HCO3 25.8 12/02/2018 1829   TCO2 28 12/02/2018 1829   ACIDBASEDEF 2.0 12/02/2018 1829   O2SAT 95.0 12/02/2018 1829     Coagulation Profile: Recent Labs  Lab 07/04/2020 1414 07/16/20 0152 07/16/20 1647 07/17/20 0103  INR >10.0* 7.5* 2.8* 4.7*    Cardiac Enzymes: No results for input(s): CKTOTAL, CKMB, CKMBINDEX, TROPONINI in the last 168 hours.  HbA1C: Hgb A1c MFr Bld  Date/Time Value Ref Range Status  07/14/2020 10:50 AM 5.4 4.8 - 5.6 % Final    Comment:    (NOTE) Pre diabetes:          5.7%-6.4%  Diabetes:              >6.4%  Glycemic control for   <7.0% adults with diabetes   11/27/2018 12:25 PM 4.9 4.8 - 5.6 % Final    Comment:    (NOTE)         Prediabetes: 5.7 - 6.4         Diabetes: >6.4         Glycemic control for adults with diabetes: <7.0     CBG: Recent Labs  Lab 07/16/20 1236 07/16/20 1420 07/16/20 2318 07/17/20 0631 07/17/20 0728  GLUCAP 135* 104* 92 106* 111*    CRITICAL CARE Performed by: Cristal Generous  Total critical care time: 48 minutes  Critical care time was exclusive of separately billable procedures and treating other patients. Critical care was necessary to treat or prevent imminent or life-threatening deterioration.  Critical care was time spent personally by me on the following activities: development of treatment plan with patient and/or surrogate as well as nursing, discussions with consultants, evaluation of patient's response to treatment, examination of patient,  obtaining history from patient or surrogate, ordering and performing treatments and interventions, ordering and review of laboratory studies, ordering and review of radiographic studies, pulse oximetry and re-evaluation of patient's condition.  Shirlee Limerick  Kynleigh Artz MSN, AGACNP-BC [place] [phone] If no answer, [phone] 07/17/2020, 8:23 AM

## 2020-07-17 NOTE — Progress Notes (Signed)
Subjective/Chief Complaint: Still has some belly pain but stable. INR was down but up to 4.7 this am.   Objective: Vital signs in last 24 hours: Temp:  [94.3 F (34.6 C)-97.8 F (36.6 C)] 97.8 F (36.6 C) (02/27 0700) Pulse Rate:  [79-202] 107 (02/27 0700) Resp:  [13-30] 18 (02/27 0700) BP: (77-154)/(44-140) 106/63 (02/27 0700) SpO2:  [87 %-100 %] 100 % (02/27 0700) Weight:  [68 kg] 68 kg (02/27 0550) Last BM Date:  (PTA)  Intake/Output from previous day: 02/26 0701 - 02/27 0700 In: 4119.4 [I.V.:1490.2; Blood:1889.4; NG/GT:60; IV Piggyback:679.8] Out: 1500 [Emesis/NG output:1500] Intake/Output this shift: No intake/output data recorded.  General appearance: alert and cooperative Resp: clear to auscultation bilaterally Cardio: regular rate and rhythm GI: some distension and diffuse tenderness  Lab Results:  Recent Labs    07/16/20 0152 07/16/20 1647  WBC 22.6* 18.0*  HGB 7.0* 9.0*  HCT 21.6* 25.1*  PLT 268 178   BMET Recent Labs    07/16/20 0152 07/16/20 1647  NA 135 136  K 5.9* 2.9*  CL 90* 94*  CO2 18* 25  GLUCOSE 79 98  BUN 93* 25*  CREATININE 9.33* 2.78*  CALCIUM 8.1* 8.1*   PT/INR Recent Labs    07/16/20 1647 07/17/20 0103  LABPROT 28.7* 42.8*  INR 2.8* 4.7*   ABG No results for input(s): PHART, HCO3 in the last 72 hours.  Invalid input(s): PCO2, PO2  Studies/Results: CT Angio Chest/Abd/Pel for Dissection W and/or W/WO  Result Date: 07/16/2020 CLINICAL DATA:  60 year old with abdominal pain and suspected aortic dissection. Hemoperitoneum on recent CTA. EXAM: CT ANGIOGRAPHY CHEST, ABDOMEN AND PELVIS TECHNIQUE: Non-contrast CT of the chest was initially obtained. Multidetector CT imaging through the chest, abdomen and pelvis was performed using the standard protocol during bolus administration of intravenous contrast. Multiplanar reconstructed images and MIPs were obtained and reviewed to evaluate the vascular anatomy. CONTRAST:  179mL  OMNIPAQUE IOHEXOL 350 MG/ML SOLN COMPARISON:  CTA chest abdomen and pelvis 07/14/2020 FINDINGS: CTA CHEST FINDINGS Cardiovascular: Surgical replacement of the mitral and aortic valves. No evidence for an aortic dissection or intramural hematoma. Ascending thoracic aorta measures up to 3.6 cm. Typical three-vessel arch anatomy. The great vessels are patent. Enlarged venous structures in right upper arm are compatible with a right upper extremity AV fistula. Atherosclerotic disease involving the thoracic aorta. Main pulmonary arteries are patent. No significant pericardial effusion. Mediastinum/Nodes: Severe distension of the esophagus with fluid. Esophageal distension has slightly increased since 07/04/2020. Patient is at risk for aspiration due to the fluid-filled dilated esophagus. No significant chest lymphadenopathy. Lungs/Pleura: Trachea and mainstem bronchi. Small amount of mucus in the distal trachea and proximal right mainstem bronchus. This is concerning for aspiration based on the fluid in the esophagus. There is increased volume loss and consolidation in the right lower lobe compared to the recent comparison examination. Increased consolidation in the right middle lobe. Increased volume loss and consolidation in the left lower lobe. Musculoskeletal: Diffuse sclerosis in the bones compatible with history of end-stage renal disease. Patient has had median sternotomy. Review of the MIP images confirms the above findings. CTA ABDOMEN AND PELVIS FINDINGS VASCULAR Aorta: Atherosclerotic disease in the abdominal aorta without aneurysm or dissection. Celiac: Celiac trunk is patent. Significant stenosis at the origin of the splenic artery. Extensive atherosclerotic disease involving the splenic artery. Irregularity in common hepatic artery and branches could be related to atherosclerotic disease and/or FMD. SMA: Extensive atherosclerotic calcifications involving the SMA with areas of at least  mild narrowing.  Renals: Extensive atherosclerotic disease at the origin of the right renal artery with minimal flow to the right renal artery. Previous endovascular coil embolization to the distal left main renal artery. There is minimal flow into the left kidney. IMA: Origin is patent. Limited evaluation of the distal distribution. Inflow: Iliac arteries are heavily calcified bilaterally. Common, internal and external iliac arteries are patent bilaterally. Veins: No obvious venous abnormality within the limitations of this arterial phase study. Review of the MIP images confirms the above findings. NON-VASCULAR Hepatobiliary: High-density fluid around the liver is compatible with hemoperitoneum. There is now high-density material in the gallbladder and most compatible with vicarious contrast excretion. Small hypodensities in liver are likely incidental findings and too small to definitively characterize. Portal venous system is not evaluated due to the phase of contrast. Pancreas: No gross abnormality but limited evaluation due to the phase of contrast. Spleen: Again noted is a small spleen with extensive calcifications. Adrenals/Urinary Tract: Limited evaluation of the adrenal glands. Again noted are atrophic kidneys bilaterally with small cysts and cortical calcifications. Negative for hydronephrosis. Limited evaluation of the urinary bladder. Stomach/Bowel: Stomach is moderately distended with fluid. No significant abnormality involving the colon. Difficult to exclude mild dilatation of small-bowel loops. Difficult to evaluate the bowel structures due to the arterial phase of contrast and hemoperitoneum. Lymphatic: No significant lymph node enlargement in the abdomen or pelvis. Reproductive: Again noted are multiple calcifications associated with the uterus and suggestive for fibroid disease. Limited evaluation of the adnexa. Other: Again noted is high-density material within the abdomen and pelvis compatible with  hemoperitoneum. The amount of hemoperitoneum has increased since 07/13/2020. For instance, there is hyperdense material in the posterior pelvis on sequence 7, image 248 that measures 6.6 cm in the AP dimension and suspect this is mostly related to hemoperitoneum. Previously this area of fluid measured 2.9 cm in the AP dimension. There is diffuse mesenteric edema. Diffuse thickening or edema involving the omentum. Musculoskeletal: Diffuse sclerosis of bones and compatible with renal osteodystrophy. No acute bone abnormality. Subchondral cysts involving the anterior left acetabulum are likely degenerative in etiology. Review of the MIP images confirms the above findings. IMPRESSION: Vascular: 1. Negative for an aortic dissection. 2. Extensive atherosclerotic disease throughout the chest, abdomen and pelvis. Irregularity of the visceral arteries is likely associated with atherosclerotic disease but cannot exclude fibromuscular dysplasia. 3. No evidence for active contrast extravasation or large pseudoaneurysm formation. 4. Previous coil embolization in the left renal artery. Nonvascular: 1. Increased hemoperitoneum. In particular, the hemoperitoneum has increased in the pelvis. Source or etiology for the hemoperitoneum remains unknown. 2. Severe distension of the esophagus with fluid. Moderate distention of the stomach. Patient is at high risk for aspiration due to the fluid-filled distended esophagus. 3. Increased consolidation in both lower lobes. Aspiration cannot be excluded. 4. Atrophy and chronic changes involving both kidneys and compatible with history of end-stage renal disease. These results were called by telephone at the time of interpretation on 07/16/2020 at 10:38 am to provider GRACE BOWSER , who verbally acknowledged these results. Electronically Signed   By: Markus Daft M.D.   On: 07/16/2020 10:52   CT Angio Chest/Abd/Pel for Dissection W and/or Wo Contrast  Result Date: 06/27/2020 CLINICAL DATA:   Severe lower abdominal pain and constipation for 2 days. History of left retroperitoneal hemorrhage status post coil embolization of left renal arteries 01/27/2020. End-stage renal disease on hemodialysis. EXAM: CT ANGIOGRAPHY CHEST, ABDOMEN AND PELVIS TECHNIQUE: Non-contrast CT of  the chest was initially obtained. Multidetector CT imaging through the chest, abdomen and pelvis was performed using the standard protocol during bolus administration of intravenous contrast. Multiplanar reconstructed images and MIPs were obtained and reviewed to evaluate the vascular anatomy. CONTRAST:  173mL OMNIPAQUE IOHEXOL 350 MG/ML SOLN COMPARISON:  01/26/2020 CT abdomen/pelvis. 10/30/2018 CT angiogram of the chest, abdomen and pelvis. FINDINGS: CTA CHEST FINDINGS Cardiovascular: Stable mild cardiomegaly. No significant pericardial effusion/thickening. Aortic and mitral valve prostheses in place. Left anterior descending coronary atherosclerosis. Atherosclerotic nonaneurysmal thoracic aorta. No acute intramural hematoma, dissection, pseudoaneurysm or penetrating atherosclerotic ulcer in the thoracic aorta. Top-normal caliber main pulmonary artery (3.3 cm diameter). No central pulmonary emboli. Mediastinum/Nodes: No discrete thyroid nodules. Mildly patulous thoracic esophagus with fluid level in mid to lower thoracic esophagus. No pathologically enlarged axillary, mediastinal or hilar lymph nodes. Lungs/Pleura: No pneumothorax. No pleural effusion. A few new scattered indistinct small right pulmonary nodules, largest 4 mm in the peripheral apical right upper lobe (series 8/image 31). Mild-to-moderate platelike atelectasis in the mid to lower lungs bilaterally, right greater than left. No acute consolidative airspace disease or lung masses. Musculoskeletal: No aggressive appearing focal osseous lesions. Intact sternotomy wires. Generalized sclerosis in the thoracic osseous structures, similar, compatible with renal osteodystrophy.  Review of the MIP images confirms the above findings. CTA ABDOMEN AND PELVIS FINDINGS VASCULAR Aorta: Atherosclerotic nonaneurysmal abdominal aorta with no dissection or significant stenosis. Celiac: Atherosclerotic without evidence of aneurysm, dissection, vasculitis or significant stenosis. SMA: Atherosclerotic without evidence of aneurysm, dissection or vasculitis. There is irregular multifocal moderate to high-grade stenosis throughout the entire SMA. Renals: Coil embolization of left renal artery near the left renal sinus. Irregular high-grade stenosis of the proximal right renal artery. No pseudoaneurysm. IMA: Patent without evidence of aneurysm, dissection, vasculitis or significant stenosis. Inflow: Patent without evidence of aneurysm, dissection, vasculitis or significant stenosis. Veins: No obvious venous abnormality within the limitations of this arterial phase study. Review of the MIP images confirms the above findings. NON-VASCULAR Hepatobiliary: Normal liver size. Scattered subcentimeter hypodense liver lesions are too small to characterize and are unchanged, presumably benign. No new liver lesions. Normal gallbladder with no radiopaque cholelithiasis. No biliary ductal dilatation. Pancreas: Normal, with no mass or duct dilation. Spleen: Atrophic calcified spleen, unchanged.  No splenic masses. Adrenals/Urinary Tract: Normal adrenals. Symmetric severe atrophy of kidneys bilaterally. No hydronephrosis. Numerous small simple bilateral renal cysts, largest 1.7 cm in the posterior lower right kidney. Hyperdense subcentimeter posterior lower left renal cortical lesion (series 6/image 96), requiring no follow-up. Bladder is completely collapsed and grossly normal. Stomach/Bowel: Moderate hiatal hernia. Otherwise normal nondistended stomach. Normal caliber small bowel with no small bowel wall thickening. Normal appendix. Moderate diffuse colonic diverticulosis with no large bowel wall thickening or  significant pericolonic fat stranding. Vascular/Lymphatic: No pathologically enlarged lymph nodes in the abdomen or pelvis. Reproductive: Mildly enlarged myomatous uterus with coarsely calcified degenerated uterine fibroids, largest 2.3 cm, unchanged. No discrete adnexal masses. Other: No pneumoperitoneum. New large volume hemoperitoneum, most prominent in the perihepatic space and anterior pelvic peritoneum surrounding the uterus and superior to the bladder. No evidence of active contrast extravasation. Musculoskeletal: No aggressive appearing focal osseous lesions. Diffuse sclerosis throughout abdominopelvic skeleton, similar, compatible with renal osteodystrophy. Mild lumbar spondylosis. Review of the MIP images confirms the above findings. IMPRESSION: 1. New large volume hemoperitoneum of unclear source, most prominent in the perihepatic space and anterior pelvic peritoneum surrounding the uterus and superior to the bladder. No evidence of active contrast extravasation. No  acute aortic syndrome. 2. Atherosclerotic stenoses throughout the SMA and proximal right renal artery as detailed. Coil embolization of left renal artery near the left renal sinus. No pseudoaneurysm identified. 3. Moderate hiatal hernia. Mildly patulous thoracic esophagus with fluid level, suggesting esophageal dysmotility and/or gastroesophageal reflux. 4. A few new scattered indistinct small right pulmonary nodules, largest 4 mm, probably inflammatory. Recommend attention on follow-up chest CT in 3-6 months. 5. Moderate diffuse colonic diverticulosis. 6. Renal osteodystrophy. 7. Mildly enlarged myomatous uterus. 8. Aortic Atherosclerosis (ICD10-I70.0). Critical Value/emergent results were called by telephone at the time of interpretation on 06/24/2020 at 2:06 pm to provider [facility], who verbally acknowledged these results. Electronically Signed   By: Ilona Sorrel M.D.   On: 06/23/2020 14:09    Anti-infectives: Anti-infectives (From  admission, onward)   Start     Dose/Rate Route Frequency Ordered Stop   07/16/20 0930  piperacillin-tazobactam (ZOSYN) IVPB 2.25 g        2.25 g 100 mL/hr over 30 Minutes Intravenous Every 8 hours 07/16/20 0844        Assessment/Plan: s/p * No surgery found * INR 4.7  will need more ffp and likely plts today. serial hg  No source identified yet. No plan for surgery at this point Will follow closely though Looks like renal was able to dialyze in room yesterday  LOS: 2 days    Autumn Messing III 07/17/2020

## 2020-07-17 NOTE — Progress Notes (Signed)
[facility] Progress Note  Subjective: pt moved to ICU yest am and had HD w/ one early hypotensive episode but none further and completed HD x 3 h.    Vitals:   07/17/20 1000 07/17/20 1100 07/17/20 1400 07/17/20 1430  BP:  109/61 (!) 102/59 104/65  Pulse: (!) 115 (!) 115 (!) 118 (!) 112  Resp: (!) 25 (!) 26 17 (!) 21  Temp:  97.7 F (36.5 C)  98.1 F (36.7 C)  TempSrc:  Oral  Oral  SpO2: 100% 100% 100% 100%  Weight:      Height:        Exam:  alert, nad , NG tube in , FM O2  no jvd  Chest cta bilat to bases  Cor reg no RG  Abd firm, minimally tender, slightly swollen,  no rebound   Ext no LE edema   Alert, NF, ox3   OP HD: MWF [facility]  3h 47min  450/500  59.5kg  2/2 bath R AVF  Hep none  Mircera 60 mcg q2wks - last 07/08/20 Sensipar 180mg  with HD treatments- last 07/11/20 Anuryxia 210mg  3 tablets TID with meals  - Last Hgb 9.3 and Tsat 44 at OP unit  Assessment/Plan: 1. Abdominal pain/ hemoperitoneum- pt on coumadin w/ INR > 10 on admit. CT showing nonspecific hemoperitoneum, no active arterial leak.  Seen by surgery > recommended to correct INR completely before considering surgery.  2. Hypotension: due to hemorrhagic shock. Appears to be stabilizing.  3. Volume: mild vol^ on exam. Up by wts (8kg+), not sure if accurate. UF 2-2.5 L goal w/ HD tomorrow.  4. Coagulopathy- INR > 10 on admit. SP vit K x 20mg  po,  FFPx 8 and prbc x 3. INR 2.5- 4 today.  5.  ESRD -  MWF. Had HD here on Sat in ICU. Next HD tomorrow.  6.  Anemia of ABL/ CKD - transfusions given prn sp 3 u total. Last outpt Mircera 50mcg 2/18, next dose due on 07/22/20 , have ordered.  7. Secondary Hyperparathyroidism - Last PO4 6.2 (OP HD). Will continue Sensipar and Anuryxia 8. Nutrition - Advanced as tolerated to renal diet when clinically stable   Rob Schertz 07/17/2020, 2:50 PM   Recent Labs  Lab 07/16/20 0152 07/16/20 1647 07/17/20 1133  K 5.9* 2.9* 4.7  BUN 93* 25* 49*  CREATININE  9.33* 2.78* 5.12*  CALCIUM 8.1* 8.1* 8.2*  PHOS 8.8*  --  5.5*  HGB 7.0* 9.0* 8.1*   Inpatient medications: . sodium chloride   Intravenous Once  . sodium chloride   Intravenous Once  . [START ON 08-09-2020] calcitRIOL  2 mcg Oral Q M,W,F  . Chlorhexidine Gluconate Cloth  6 each Topical Q0600  . [START ON 08-09-20] cinacalcet  180 mg Oral Q M,W,F-HD  . [START ON 07/22/2020] darbepoetin (ARANESP) injection - DIALYSIS  40 mcg Intravenous Q Fri-HD  . dextrose  25 g Intravenous Once  . ferric citrate  840 mg Oral TID  . lipase/protease/amylase)  20,880 Units Per Tube Once   And  . sodium bicarbonate  650 mg Per Tube Once   . sodium chloride    . sodium chloride    . desmopressin (DDAVP) IV for Bleeding    . dextrose 75 mL/hr at 07/17/20 1400  . pantoprazole (PROTONIX) IV Stopped (07/17/20 1250)  . piperacillin-tazobactam (ZOSYN)  IV 3.375 g (07/17/20 1445)   sodium chloride, sodium chloride, acetaminophen **OR** acetaminophen, alteplase, heparin, lidocaine (PF), lidocaine-prilocaine, ondansetron (ZOFRAN) IV,  pentafluoroprop-tetrafluoroeth

## 2020-07-17 NOTE — Progress Notes (Signed)
Date and time results received: 07/17/20 1954 (use smartphrase ".now" to insert current time)  Test: Hemoglobin Critical Value: 6.6  Name of Provider Notified: E-link  Orders Received? Or Actions Taken?: Orders Received - See Orders for details - Awaiting further instruction

## 2020-07-17 NOTE — Progress Notes (Signed)
Date and time results received: 07/17/20 2311 (use smartphrase ".now" to insert current time)  Test: INR Critical Value: 6.5  Name of Provider Notified: Marchelle Gearing, MD  Orders Received? Or Actions Taken?: Actions Taken: No further actions at this time

## 2020-07-17 NOTE — Progress Notes (Signed)
Situation: Chaplain Medinas-Lockley responding to code blue for pt [name].  Background: No family present at time of visit.   Actions & Assessments: Chaplain spoke with nurse who shared she would notify chaplain of further spiritual/emotional support needs after reaching out to family.  Recommendations: Chaplain remains available for follow-up spiritual/emotional support as needed.  Rev. Susanne Borders, MDiv      07/17/20 2200  Clinical Encounter Type  Visited With Patient not available  Visit Type Code

## 2020-07-17 NOTE — Progress Notes (Signed)
Date and time results received: 07/17/20 11:21 PM  (use smartphrase ".now" to insert current time)  Test: Troponin Critical Value: 306  Name of Provider Notified: Marchelle Gearing, MD  Orders Received? Or Actions Taken?: Actions Taken: No further instructions at this time

## 2020-07-18 DIAGNOSIS — I120 Hypertensive chronic kidney disease with stage 5 chronic kidney disease or end stage renal disease: Secondary | ICD-10-CM | POA: Diagnosis not present

## 2020-07-18 DIAGNOSIS — Z992 Dependence on renal dialysis: Secondary | ICD-10-CM | POA: Diagnosis not present

## 2020-07-18 DIAGNOSIS — I469 Cardiac arrest, cause unspecified: Secondary | ICD-10-CM | POA: Diagnosis not present

## 2020-07-18 DIAGNOSIS — N186 End stage renal disease: Secondary | ICD-10-CM | POA: Diagnosis not present

## 2020-07-18 LAB — PREPARE FRESH FROZEN PLASMA
Unit division: 0
Unit division: 0
Unit division: 0
Unit division: 0
Unit division: 0

## 2020-07-18 LAB — BPAM FFP
Blood Product Expiration Date: 202203032359
Blood Product Expiration Date: 202203032359
Blood Product Expiration Date: 202203042359
Blood Product Expiration Date: 202203042359
Blood Product Expiration Date: 202203042359
Blood Product Expiration Date: 202203042359
ISSUE DATE / TIME: 202202270423
ISSUE DATE / TIME: 202202270537
ISSUE DATE / TIME: 202202271422
ISSUE DATE / TIME: 202202271646
ISSUE DATE / TIME: 202202272037
ISSUE DATE / TIME: 202202272037
Unit Type and Rh: 6200
Unit Type and Rh: 6200
Unit Type and Rh: 600
Unit Type and Rh: 6200
Unit Type and Rh: 6200
Unit Type and Rh: 6200

## 2020-07-18 LAB — TYPE AND SCREEN
ABO/RH(D): A NEG
Antibody Screen: NEGATIVE
Unit division: 0
Unit division: 0
Unit division: 0
Unit division: 0

## 2020-07-18 LAB — GLUCOSE, CAPILLARY
Glucose-Capillary: 110 mg/dL — ABNORMAL HIGH (ref 70–99)
Glucose-Capillary: 61 mg/dL — ABNORMAL LOW (ref 70–99)
Glucose-Capillary: 88 mg/dL (ref 70–99)
Glucose-Capillary: 93 mg/dL (ref 70–99)

## 2020-07-18 LAB — HEPATIC FUNCTION PANEL
ALT: 103 U/L — ABNORMAL HIGH (ref 0–44)
AST: 131 U/L — ABNORMAL HIGH (ref 15–41)
Albumin: 2.6 g/dL — ABNORMAL LOW (ref 3.5–5.0)
Alkaline Phosphatase: 117 U/L (ref 38–126)
Bilirubin, Direct: 0.4 mg/dL — ABNORMAL HIGH (ref 0.0–0.2)
Indirect Bilirubin: 0.6 mg/dL (ref 0.3–0.9)
Total Bilirubin: 1 mg/dL (ref 0.3–1.2)
Total Protein: 6.3 g/dL — ABNORMAL LOW (ref 6.5–8.1)

## 2020-07-18 LAB — BPAM RBC
Blood Product Expiration Date: 202203282359
Blood Product Expiration Date: 202203312359
Blood Product Expiration Date: 202203312359
Blood Product Expiration Date: 202203312359
ISSUE DATE / TIME: 202202251725
ISSUE DATE / TIME: 202202272040
Unit Type and Rh: 600
Unit Type and Rh: 600
Unit Type and Rh: 600
Unit Type and Rh: 600

## 2020-07-18 LAB — LACTATE DEHYDROGENASE: LDH: 575 U/L — ABNORMAL HIGH (ref 98–192)

## 2020-07-18 LAB — FIBRINOGEN: Fibrinogen: 633 mg/dL — ABNORMAL HIGH (ref 210–475)

## 2020-07-18 LAB — TROPONIN I (HIGH SENSITIVITY): Troponin I (High Sensitivity): 522 ng/L (ref <18)

## 2020-07-18 LAB — LACTIC ACID, PLASMA: Lactic Acid, Venous: >11 mmol/L (ref 0.5–1.9)

## 2020-07-18 LAB — PATHOLOGIST SMEAR REVIEW

## 2020-07-18 MED ORDER — MIDAZOLAM HCL 2 MG/2ML IJ SOLN
0.5000 mg | INTRAMUSCULAR | Status: DC | PRN
  Administered 2020-07-18: 0.5 mg via INTRAVENOUS
  Filled 2020-07-18: qty 2

## 2020-07-18 MED ORDER — SODIUM CHLORIDE 0.9 % IV SOLN
500.0000 mg | INTRAVENOUS | Status: DC
  Administered 2020-07-18: 500 mg via INTRAVENOUS
  Filled 2020-07-18: qty 0.5

## 2020-07-18 MED ORDER — SODIUM CHLORIDE 0.9% FLUSH: 10.0000 mL | Freq: Two times a day (BID) | INTRAVENOUS | Status: DC

## 2020-07-18 MED ORDER — EPINEPHRINE HCL 5 MG/250ML IV SOLN IN NS
0.5000 ug/min | INTRAVENOUS | Status: DC
  Administered 2020-07-18: 01:00:00 1 ug/min via INTRAVENOUS
  Filled 2020-07-18 (×2): qty 250

## 2020-07-18 MED ORDER — SODIUM CHLORIDE 0.9% FLUSH: 10.0000 mL | INTRAVENOUS | Status: DC | PRN

## 2020-07-18 MED ORDER — DEXTROSE 50 % IV SOLN
12.5000 g | INTRAVENOUS | Status: AC
  Administered 2020-07-18: 12.5 g via INTRAVENOUS

## 2020-07-18 MED ORDER — DEXTROSE 50 % IV SOLN
INTRAVENOUS | Status: AC
  Filled 2020-07-18: qty 50

## 2020-07-18 MED ORDER — FENTANYL 2500MCG IN NS 250ML (10MCG/ML) PREMIX INFUSION
0.0000 ug/h | INTRAVENOUS | Status: DC
  Administered 2020-07-18: 50 ug/h via INTRAVENOUS
  Filled 2020-07-18: qty 250

## 2020-07-18 MED ORDER — FENTANYL 2500MCG IN NS 250ML (10MCG/ML) PREMIX INFUSION: 0.0000 ug/h | INTRAVENOUS | Status: DC

## 2020-07-19 LAB — GRAM STAIN: Gram Stain: NONE SEEN

## 2020-07-19 LAB — HAPTOGLOBIN: Haptoglobin: 11 mg/dL — ABNORMAL LOW (ref 33–346)

## 2020-07-19 NOTE — Procedures (Signed)
Central Venous Catheter Insertion Procedure Note  [name]  [id number]  [birth year]  Date:08/16/2020  Time:9:37 AM   Provider Performing:Elvi Leventhal Duwayne Heck   Procedure: Insertion of Non-tunneled Central Venous Catheter(36556) with US guidance (29924)   Indication(s) Medication administration  Consent Unable to obtain consent due to emergent nature of procedure.  Anesthesia Topical only with 1% lidocaine   Timeout Verified patient identification, verified procedure, site/side was marked, verified correct patient position, special equipment/implants available, medications/allergies/relevant history reviewed, required imaging and test results available.  Sterile Technique Maximal sterile technique including full sterile barrier drape, hand hygiene, sterile gown, sterile gloves, mask, hair covering, sterile ultrasound probe cover (if used).  Procedure Description Area of catheter insertion was cleaned with chlorhexidine and draped in sterile fashion.  With real-time ultrasound guidance a central venous catheter was placed into the left internal jugular vein. Nonpulsatile blood flow and easy flushing noted in all ports.  The catheter was sutured in place and sterile dressing applied.  Complications/Tolerance None; patient tolerated the procedure well. Chest X-ray is ordered to verify placement for internal jugular or subclavian cannulation.   Chest x-ray is not ordered for femoral cannulation.  EBL Minimal  Specimen(s) None

## 2020-07-19 NOTE — Progress Notes (Signed)
Central line pulled back 2 cm.

## 2020-07-19 NOTE — Progress Notes (Signed)
Spoke with [employer], pt can be released to the [facility], eyes are to be prepped , suitable for tissue and eyes

## 2020-07-19 NOTE — Progress Notes (Signed)
Hypoglycemic Event  CBG: 61  Treatment: D50 25 mL (12.5 gm)  Symptoms: None  Follow-up CBG: Time:0345 CBG Result:110  Possible Reasons for Event: Inadequate meal intake      Jackalyn Lombard

## 2020-07-19 NOTE — Progress Notes (Signed)
Pharmacy Antibiotic Note  [name] is a 60 y.o. female admitted on 06/22/2020 with sepsis.  Pharmacy has been consulted for Merrem dosing. ESRD on HD MWF, may need CRRT.   Plan: Merrem 500 mg IV q24h, adjust dose if she transitions to CRRT  Height: 5\' 6"  (167.6 cm) Weight: 68 kg (149 lb 14.6 oz) IBW/kg (Calculated) : 59.3  Temp (24hrs), Avg:97.4 F (36.3 C), Min:96.5 F (35.8 C), Max:98.2 F (36.8 C)  Recent Labs  Lab 06/30/2020 1412 06/27/2020 2307 07/16/20 0152 07/16/20 1647 07/16/20 2002 07/17/20 1133 07/17/20 1910 07/17/20 2149 07/17/20 2215  WBC  --  18.3* 22.6* 18.0*  --  35.3* 36.9*  --   --   CREATININE  --  9.46* 9.33* 2.78*  --  5.12*  --  4.73*  --   LATICACIDVEN 8.6* 4.1*  --  2.8* 4.6*  --   --   --  >11.0*    Estimated Creatinine Clearance: 12.1 mL/min (A) (by C-G formula based on SCr of 4.73 mg/dL (H)).    No Known Allergies  Narda Bonds, PharmD, BCPS Clinical Pharmacist Phone: [phone]

## 2020-07-19 NOTE — Progress Notes (Signed)
Situation: Chaplain Medinas-Lockley responding to page for follow-up for pt [name] as family is now present at bedside.  Background: Facts: Pt continues to seem non-communicative, though appearing somewhat agitated. RN is aware of this. Family: Ms. Eva mother Stanton Kidney) and two daughters Myrlene Broker and Priscille Kluver (spelling?)) are present at this time. Feelings: Family was somewhat tearful and expressed concern for [name]'s agitation. Faith: Family requested prayer at this time.  Actions & Assessments: Family seems to be coping with news of [name]'s episode earlier this evening (2/27) by surrounding their loved one with care, wiping her face, and soothing her by telling her that her family is here.  Chaplain offered compassionate presence and prayer at this time.  Recommendations: For Chaplains: continue to offer spiritual/emotional support, especially to family, as needed. Family seemed appreciative of prayers for God's presence and peace. For Staff: refer to chaplains as needed for additional spiritual/emotional support as this seems a significant moment during [name]'s hospitalization.  Chaplain remains available for follow-up spiritual/emotional support as needed.  Rev. Susanne Borders, MDiv     07/29/2020 0100  Clinical Encounter Type  Visited With Patient and family together  Visit Type Follow-up  Spiritual Encounters  Spiritual Needs Prayer

## 2020-07-19 NOTE — Progress Notes (Signed)
[address] called, spoke with Leonor Liv, Referral Number [phone]

## 2020-07-19 NOTE — Progress Notes (Signed)
PT Cancellation Note  Patient Details Name: [name] MRN: [id number] DOB: [birth year]   Cancelled Treatment:    Reason Eval/Treat Not Completed: Medical issues which prohibited therapy (pt intubated this am with FiO2 60%)   Ayden Apodaca B Jossette Zirbel 08/11/20, 6:53 AM  Bayard Males, PT Acute Rehabilitation Services Pager: [phone] Office: [phone]

## 2020-07-19 NOTE — Procedures (Addendum)
Intubation Procedure Note  Shantrell Placzek  [id number]  Jul 21, 1960  Date:07-30-2020  Time:9:20 AM   Provider Performing:Taelyr Jantz Duwayne Heck    Procedure: Intubation (03496)  Indication(s) Respiratory Failure  Consent Unable to obtain consent due to emergent nature of procedure.   Anesthesia none, cardiac arrest, unresponsive   Time Out Verified patient identification, verified procedure, site/side was marked, verified correct patient position, special equipment/implants available, medications/allergies/relevant history reviewed, required imaging and test results available.   Sterile Technique Usual hand hygeine, masks, and gloves were used   Procedure Description Patient positioned in bed supine.  Sedation given as noted above.  Patient was intubated with endotracheal tube using mac 3.  View was Grade 2 only posterior commissure .  Number of attempts was 1.  Colorimetric CO2 detector was consistent with tracheal placement. Large volume clear watery secretions in mouth   Complications/Tolerance None; patient tolerated the procedure well. Chest X-ray is ordered to verify placement.   EBL none   Specimen(s) None

## 2020-07-19 NOTE — Progress Notes (Signed)
Cardiac arrest:  Called to bedside for cardiac arrest, one dose epi given, rosc achieved.  Appeared to be PEA.  Intubated for airway protection after rosc  Patient had been mildly hypotensive prior to arrest, last be 8 min prior to sudden loss of consciousness and loss of pulses.  Started on levophed and vasopressin.

## 2020-07-19 NOTE — Progress Notes (Signed)
Spoke with [facility] Examiner, pt can be extubated and release to the morgue

## 2020-07-19 NOTE — Procedures (Addendum)
Arterial Catheter Insertion Procedure Note  [name]  [id number]  [birth year]  Date:30-Jul-2020  Time:9:30 AM    Provider Performing: Collier Bullock    Procedure: Insertion of Arterial Line [phone]) with US guidance (05110)   Indication(s) Blood pressure monitoring and/or need for frequent ABGs  Consent Unable to obtain consent due to emergent nature of procedure.  Anesthesia Lidocaine 1%   Time Out Verified patient identification, verified procedure, site/side was marked, verified correct patient position, special equipment/implants available, medications/allergies/relevant history reviewed, required imaging and test results available.   Sterile Technique Maximal sterile technique including full sterile barrier drape, hand hygiene, sterile gown, sterile gloves, mask, hair covering, sterile ultrasound probe cover (if used).   Procedure Description Area of catheter insertion was cleaned with chlorhexidine and draped in sterile fashion. With real-time ultrasound guidance an arterial catheter was placed into the left femoral artery.  Appropriate arterial tracings confirmed on monitor.     Complications/Tolerance None; patient tolerated the procedure well.   EBL Minimal   Specimen(s) None

## 2020-07-19 DEATH — deceased

## 2020-07-20 LAB — CULTURE, BLOOD (ROUTINE X 2)
Culture: NO GROWTH
Culture: NO GROWTH
Special Requests: ADEQUATE
Special Requests: ADEQUATE

## 2020-07-21 LAB — TRANSFUSION REACTION
DAT C3: NEGATIVE
Post RXN DAT IgG: POSITIVE

## 2020-07-21 MED FILL — Medication: Qty: 1 | Status: AC

## 2020-07-22 LAB — GLUCOSE, CAPILLARY: Glucose-Capillary: 42 mg/dL — CL (ref 70–99)

## 2020-07-23 LAB — CULTURE, BLOOD (ROUTINE X 2): Culture: NO GROWTH

## 2020-08-19 NOTE — Death Summary Note (Signed)
DEATH SUMMARY   Patient Details  Name: [name] MRN: [id number] DOB: 08/11/1960  Admission/Discharge Information   Admit Date:  30-Jul-2020  Date of Death: Date of Death: 08-02-2020  Time of Death: Time of Death: 0643  Length of Stay: 3  Referring Physician: Benito Mccreedy, MD   Reason(s) for Hospitalization  Bleed  Diagnoses  Preliminary cause of death:   Intra-abdominal bleeding Secondary Diagnoses (including complications and co-morbidities):  Active Problems:   ESRD (end stage renal disease) on dialysis (HCC)   Hemoperitoneum   GI bleed Warfarin induced coagulopathy Acute respiratory failure with hypoxemia GI bleeding Mechanical aortic and mitral valve SLE Hypertension GERD Chronic diastolic heart failure   Brief Hospital Course (including significant findings, care, treatment, and services provided and events leading to death)  60 y/o F PMH ESRD, mechanical Aortic and mitral valve replacement, who presented to [facility] on July 30, 2022 with reports of lower abdominal pain, nausea, constipation.    The patient reports she has been constipated since 2/19 and has been using laxatives and stool softeners.  She ultimately had a few small bowel movements. She has been nauseated but no vomiting and has had lower abdominal pain.  She is known ESRD on HD (M/W/F) and went to HD on 2/21 but skipped 2/23 as she did not feel well. Her pain increased overnight into July 30, 2022 which prompted her to seek ER evaluation.  Of note, she was admitted in 01/2020 for retroperitoneal hematoma / bleed s/p FFP, PRBC and renal embolization.    Initial ER evaluation notable for LA 8.6, INR > 10. Hgb 8s BPs 90-100 in ED  Multiple services were consulted for management including critical care, general surgery, interventional radiology and nephrology for management of this incredibly complex situation. Aggressive measures were taken to correct her coagluopathy.  A source of bleeding was never clearly  identified.  Late in the evening on 2/27 she suffered a cardiac arrest in the ICU.  She was intubated, a central line was placed, intra-arterial line was placed.   Vasopressor support was used but unfortunately the patient was unable to survive and died on 08/02/2022 AM.    Pertinent Labs and Studies  Significant Diagnostic Studies DG Abd 1 View  Result Date: 07/17/2020 CLINICAL DATA:  Encounter for intubation. EXAM: ABDOMEN - 1 VIEW COMPARISON:  CT angio abdomen pelvis 07/16/2020. FINDINGS: Enteric tube coursing below the hemidiaphragm with tip and side port in the region of the pylorus noted to loop back on itself. Paucity of bowel gas. Innumerable punctate calcifications of the splenic parenchyma-likely sequelae of prior granulomatous disease. IMPRESSION: 1. Enteric tube coursing below the hemidiaphragm with loop/backward course noted in the region of the pylorus. Recommend retraction by 5 cm. 2. Paucity of bowel gas. Electronically Signed   By: Iven Finn M.D.   On: 07/17/2020 22:39   DG CHEST PORT 1 VIEW  Result Date: 07/17/2020 CLINICAL DATA:  Central line placement. EXAM: PORTABLE CHEST 1 VIEW COMPARISON:  chest x-ray 07/17/2020 10:27 p.m. FINDINGS: Interval retraction of an endotracheal tube with tip now terminating 3.5 cm above the carina. Enteric tube coursing below the hemidiaphragm with tip and side port collimated off view. Interval placement of left internal jugular central venous catheter with distal portion overlying the expected region of the superior vena cava but with tip coursing cranially terminating at the level of the clavicle. The heart size and mediastinal contours are within normal limits. Mitral and aortic valve replacement. Sternotomy wires and cardiac surgical changes again noted. Low  lung volumes with bibasilar streaky airspace opacities likely representing atelectatic changes. Retrocardiac consolidation with air bronchograms. No pulmonary edema. No pleural effusion. No  pneumothorax. No acute osseous abnormality. Innumerable splenic calcifications again noted. IMPRESSION: 1. Interval placement of left internal jugular central venous catheter with distal portion overlying the expected region of the superior vena cava but with tip coursing cranially terminating at the level of the clavicle. 2. Interval retraction of an endotracheal tube now in good position. 3. Enteric tube coursing below the hemidiaphragm with tip and side port collimated off view. 4. Retrocardiac consolidation likely representing infection/inflammation. 5. Low lung volumes with bibasilar atelectasis. Cannot exclude infection/inflammation of the right base. These results were called by telephone at the time of interpretation on 07/17/2020 at 11:40 pm to provider [facility] , who verbally acknowledged these results. Electronically Signed   By: Iven Finn M.D.   On: 07/17/2020 23:42   DG Chest Port 1 View  Result Date: 07/17/2020 CLINICAL DATA:  Intubation. EXAM: PORTABLE CHEST 1 VIEW COMPARISON:  July 17, 2020. FINDINGS: Stable cardiomediastinal silhouette. Endotracheal and nasogastric tubes are in grossly good position. Status post cardiac valve repair. No pneumothorax is noted. Hypoinflation of the lungs is noted with mild bibasilar subsegmental atelectasis. Bony thorax is unremarkable. IMPRESSION: Hypoinflation of the lungs with mild bibasilar subsegmental atelectasis. Endotracheal and nasogastric tubes are in grossly good position. Electronically Signed   By: Marijo Conception M.D.   On: 07/17/2020 22:35   DG CHEST PORT 1 VIEW  Result Date: 07/17/2020 CLINICAL DATA:  Previous mitral valve and aortic valve replacement. Chronic Coumadin. No abdominal pain. EXAM: PORTABLE CHEST 1 VIEW COMPARISON:  01/06/2020 FINDINGS: Nasogastric tube courses into the stomach and off the film as tip is not definitely visualized. Sternotomy wires unchanged. Known prosthetic heart valve is present. Lungs are  hypoinflated with minimal bibasilar linear opacification likely atelectasis. No significant effusion. Cardiomediastinal silhouette and remainder of the exam is unchanged. IMPRESSION: 1. Hypoinflation with minimal bibasilar linear density likely atelectasis. 2. Nasogastric tube courses into the stomach and off the film as tip is not visualized. Electronically Signed   By: Marin Olp M.D.   On: 07/17/2020 09:19   CT Angio Chest/Abd/Pel for Dissection W and/or W/WO  Result Date: 07/16/2020 CLINICAL DATA:  60 year old with abdominal pain and suspected aortic dissection. Hemoperitoneum on recent CTA. EXAM: CT ANGIOGRAPHY CHEST, ABDOMEN AND PELVIS TECHNIQUE: Non-contrast CT of the chest was initially obtained. Multidetector CT imaging through the chest, abdomen and pelvis was performed using the standard protocol during bolus administration of intravenous contrast. Multiplanar reconstructed images and MIPs were obtained and reviewed to evaluate the vascular anatomy. CONTRAST:  164mL OMNIPAQUE IOHEXOL 350 MG/ML SOLN COMPARISON:  CTA chest abdomen and pelvis 06/28/2020 FINDINGS: CTA CHEST FINDINGS Cardiovascular: Surgical replacement of the mitral and aortic valves. No evidence for an aortic dissection or intramural hematoma. Ascending thoracic aorta measures up to 3.6 cm. Typical three-vessel arch anatomy. The great vessels are patent. Enlarged venous structures in right upper arm are compatible with a right upper extremity AV fistula. Atherosclerotic disease involving the thoracic aorta. Main pulmonary arteries are patent. No significant pericardial effusion. Mediastinum/Nodes: Severe distension of the esophagus with fluid. Esophageal distension has slightly increased since 06/24/2020. Patient is at risk for aspiration due to the fluid-filled dilated esophagus. No significant chest lymphadenopathy. Lungs/Pleura: Trachea and mainstem bronchi. Small amount of mucus in the distal trachea and proximal right mainstem  bronchus. This is concerning for aspiration based on the fluid in  the esophagus. There is increased volume loss and consolidation in the right lower lobe compared to the recent comparison examination. Increased consolidation in the right middle lobe. Increased volume loss and consolidation in the left lower lobe. Musculoskeletal: Diffuse sclerosis in the bones compatible with history of end-stage renal disease. Patient has had median sternotomy. Review of the MIP images confirms the above findings. CTA ABDOMEN AND PELVIS FINDINGS VASCULAR Aorta: Atherosclerotic disease in the abdominal aorta without aneurysm or dissection. Celiac: Celiac trunk is patent. Significant stenosis at the origin of the splenic artery. Extensive atherosclerotic disease involving the splenic artery. Irregularity in common hepatic artery and branches could be related to atherosclerotic disease and/or FMD. SMA: Extensive atherosclerotic calcifications involving the SMA with areas of at least mild narrowing. Renals: Extensive atherosclerotic disease at the origin of the right renal artery with minimal flow to the right renal artery. Previous endovascular coil embolization to the distal left main renal artery. There is minimal flow into the left kidney. IMA: Origin is patent. Limited evaluation of the distal distribution. Inflow: Iliac arteries are heavily calcified bilaterally. Common, internal and external iliac arteries are patent bilaterally. Veins: No obvious venous abnormality within the limitations of this arterial phase study. Review of the MIP images confirms the above findings. NON-VASCULAR Hepatobiliary: High-density fluid around the liver is compatible with hemoperitoneum. There is now high-density material in the gallbladder and most compatible with vicarious contrast excretion. Small hypodensities in liver are likely incidental findings and too small to definitively characterize. Portal venous system is not evaluated due to the  phase of contrast. Pancreas: No gross abnormality but limited evaluation due to the phase of contrast. Spleen: Again noted is a small spleen with extensive calcifications. Adrenals/Urinary Tract: Limited evaluation of the adrenal glands. Again noted are atrophic kidneys bilaterally with small cysts and cortical calcifications. Negative for hydronephrosis. Limited evaluation of the urinary bladder. Stomach/Bowel: Stomach is moderately distended with fluid. No significant abnormality involving the colon. Difficult to exclude mild dilatation of small-bowel loops. Difficult to evaluate the bowel structures due to the arterial phase of contrast and hemoperitoneum. Lymphatic: No significant lymph node enlargement in the abdomen or pelvis. Reproductive: Again noted are multiple calcifications associated with the uterus and suggestive for fibroid disease. Limited evaluation of the adnexa. Other: Again noted is high-density material within the abdomen and pelvis compatible with hemoperitoneum. The amount of hemoperitoneum has increased since 06/30/2020. For instance, there is hyperdense material in the posterior pelvis on sequence 7, image 248 that measures 6.6 cm in the AP dimension and suspect this is mostly related to hemoperitoneum. Previously this area of fluid measured 2.9 cm in the AP dimension. There is diffuse mesenteric edema. Diffuse thickening or edema involving the omentum. Musculoskeletal: Diffuse sclerosis of bones and compatible with renal osteodystrophy. No acute bone abnormality. Subchondral cysts involving the anterior left acetabulum are likely degenerative in etiology. Review of the MIP images confirms the above findings. IMPRESSION: Vascular: 1. Negative for an aortic dissection. 2. Extensive atherosclerotic disease throughout the chest, abdomen and pelvis. Irregularity of the visceral arteries is likely associated with atherosclerotic disease but cannot exclude fibromuscular dysplasia. 3. No evidence  for active contrast extravasation or large pseudoaneurysm formation. 4. Previous coil embolization in the left renal artery. Nonvascular: 1. Increased hemoperitoneum. In particular, the hemoperitoneum has increased in the pelvis. Source or etiology for the hemoperitoneum remains unknown. 2. Severe distension of the esophagus with fluid. Moderate distention of the stomach. Patient is at high risk for aspiration due to  the fluid-filled distended esophagus. 3. Increased consolidation in both lower lobes. Aspiration cannot be excluded. 4. Atrophy and chronic changes involving both kidneys and compatible with history of end-stage renal disease. These results were called by telephone at the time of interpretation on 07/16/2020 at 10:38 am to provider GRACE BOWSER , who verbally acknowledged these results. Electronically Signed   By: Markus Daft M.D.   On: 07/16/2020 10:52   CT Angio Chest/Abd/Pel for Dissection W and/or Wo Contrast  Result Date: 06/28/2020 CLINICAL DATA:  Severe lower abdominal pain and constipation for 2 days. History of left retroperitoneal hemorrhage status post coil embolization of left renal arteries 01/27/2020. End-stage renal disease on hemodialysis. EXAM: CT ANGIOGRAPHY CHEST, ABDOMEN AND PELVIS TECHNIQUE: Non-contrast CT of the chest was initially obtained. Multidetector CT imaging through the chest, abdomen and pelvis was performed using the standard protocol during bolus administration of intravenous contrast. Multiplanar reconstructed images and MIPs were obtained and reviewed to evaluate the vascular anatomy. CONTRAST:  142mL OMNIPAQUE IOHEXOL 350 MG/ML SOLN COMPARISON:  01/26/2020 CT abdomen/pelvis. 10/30/2018 CT angiogram of the chest, abdomen and pelvis. FINDINGS: CTA CHEST FINDINGS Cardiovascular: Stable mild cardiomegaly. No significant pericardial effusion/thickening. Aortic and mitral valve prostheses in place. Left anterior descending coronary atherosclerosis. Atherosclerotic  nonaneurysmal thoracic aorta. No acute intramural hematoma, dissection, pseudoaneurysm or penetrating atherosclerotic ulcer in the thoracic aorta. Top-normal caliber main pulmonary artery (3.3 cm diameter). No central pulmonary emboli. Mediastinum/Nodes: No discrete thyroid nodules. Mildly patulous thoracic esophagus with fluid level in mid to lower thoracic esophagus. No pathologically enlarged axillary, mediastinal or hilar lymph nodes. Lungs/Pleura: No pneumothorax. No pleural effusion. A few new scattered indistinct small right pulmonary nodules, largest 4 mm in the peripheral apical right upper lobe (series 8/image 31). Mild-to-moderate platelike atelectasis in the mid to lower lungs bilaterally, right greater than left. No acute consolidative airspace disease or lung masses. Musculoskeletal: No aggressive appearing focal osseous lesions. Intact sternotomy wires. Generalized sclerosis in the thoracic osseous structures, similar, compatible with renal osteodystrophy. Review of the MIP images confirms the above findings. CTA ABDOMEN AND PELVIS FINDINGS VASCULAR Aorta: Atherosclerotic nonaneurysmal abdominal aorta with no dissection or significant stenosis. Celiac: Atherosclerotic without evidence of aneurysm, dissection, vasculitis or significant stenosis. SMA: Atherosclerotic without evidence of aneurysm, dissection or vasculitis. There is irregular multifocal moderate to high-grade stenosis throughout the entire SMA. Renals: Coil embolization of left renal artery near the left renal sinus. Irregular high-grade stenosis of the proximal right renal artery. No pseudoaneurysm. IMA: Patent without evidence of aneurysm, dissection, vasculitis or significant stenosis. Inflow: Patent without evidence of aneurysm, dissection, vasculitis or significant stenosis. Veins: No obvious venous abnormality within the limitations of this arterial phase study. Review of the MIP images confirms the above findings. NON-VASCULAR  Hepatobiliary: Normal liver size. Scattered subcentimeter hypodense liver lesions are too small to characterize and are unchanged, presumably benign. No new liver lesions. Normal gallbladder with no radiopaque cholelithiasis. No biliary ductal dilatation. Pancreas: Normal, with no mass or duct dilation. Spleen: Atrophic calcified spleen, unchanged.  No splenic masses. Adrenals/Urinary Tract: Normal adrenals. Symmetric severe atrophy of kidneys bilaterally. No hydronephrosis. Numerous small simple bilateral renal cysts, largest 1.7 cm in the posterior lower right kidney. Hyperdense subcentimeter posterior lower left renal cortical lesion (series 6/image 96), requiring no follow-up. Bladder is completely collapsed and grossly normal. Stomach/Bowel: Moderate hiatal hernia. Otherwise normal nondistended stomach. Normal caliber small bowel with no small bowel wall thickening. Normal appendix. Moderate diffuse colonic diverticulosis with no large bowel wall thickening or  significant pericolonic fat stranding. Vascular/Lymphatic: No pathologically enlarged lymph nodes in the abdomen or pelvis. Reproductive: Mildly enlarged myomatous uterus with coarsely calcified degenerated uterine fibroids, largest 2.3 cm, unchanged. No discrete adnexal masses. Other: No pneumoperitoneum. New large volume hemoperitoneum, most prominent in the perihepatic space and anterior pelvic peritoneum surrounding the uterus and superior to the bladder. No evidence of active contrast extravasation. Musculoskeletal: No aggressive appearing focal osseous lesions. Diffuse sclerosis throughout abdominopelvic skeleton, similar, compatible with renal osteodystrophy. Mild lumbar spondylosis. Review of the MIP images confirms the above findings. IMPRESSION: 1. New large volume hemoperitoneum of unclear source, most prominent in the perihepatic space and anterior pelvic peritoneum surrounding the uterus and superior to the bladder. No evidence of active  contrast extravasation. No acute aortic syndrome. 2. Atherosclerotic stenoses throughout the SMA and proximal right renal artery as detailed. Coil embolization of left renal artery near the left renal sinus. No pseudoaneurysm identified. 3. Moderate hiatal hernia. Mildly patulous thoracic esophagus with fluid level, suggesting esophageal dysmotility and/or gastroesophageal reflux. 4. A few new scattered indistinct small right pulmonary nodules, largest 4 mm, probably inflammatory. Recommend attention on follow-up chest CT in 3-6 months. 5. Moderate diffuse colonic diverticulosis. 6. Renal osteodystrophy. 7. Mildly enlarged myomatous uterus. 8. Aortic Atherosclerosis (ICD10-I70.0). Critical Value/emergent results were called by telephone at the time of interpretation on 07/02/2020 at 2:06 pm to provider [facility], who verbally acknowledged these results. Electronically Signed   By: Ilona Sorrel M.D.   On: 07/06/2020 14:09    Microbiology Recent Results (from the past 240 hour(s))  Resp Panel by RT-PCR (Flu A&B, Covid) Nasopharyngeal Swab     Status: None   Collection Time: 07/07/2020  1:15 PM   Specimen: Nasopharyngeal Swab; Nasopharyngeal(NP) swabs in vial transport medium  Result Value Ref Range Status   SARS Coronavirus 2 by RT PCR NEGATIVE NEGATIVE Final    Comment: (NOTE) SARS-CoV-2 target nucleic acids are NOT DETECTED.  The SARS-CoV-2 RNA is generally detectable in upper respiratory specimens during the acute phase of infection. The lowest concentration of SARS-CoV-2 viral copies this assay can detect is 138 copies/mL. A negative result does not preclude SARS-Cov-2 infection and should not be used as the sole basis for treatment or other patient management decisions. A negative result may occur with  improper specimen collection/handling, submission of specimen other than nasopharyngeal swab, presence of viral mutation(s) within the areas targeted by this assay, and inadequate number of  viral copies(<138 copies/mL). A negative result must be combined with clinical observations, patient history, and epidemiological information. The expected result is Negative.  Fact Sheet for Patients:  EntrepreneurPulse.com.au  Fact Sheet for Healthcare Providers:  IncredibleEmployment.be  This test is no t yet approved or cleared by the [place] FDA and  has been authorized for detection and/or diagnosis of SARS-CoV-2 by FDA under an Emergency Use Authorization (EUA). This EUA will remain  in effect (meaning this test can be used) for the duration of the COVID-19 declaration under Section 564(b)(1) of the Act, 21 U.S.C.section 360bbb-3(b)(1), unless the authorization is terminated  or revoked sooner.       Influenza A by PCR NEGATIVE NEGATIVE Final   Influenza B by PCR NEGATIVE NEGATIVE Final    Comment: (NOTE) The Xpert Xpress SARS-CoV-2/FLU/RSV plus assay is intended as an aid in the diagnosis of influenza from Nasopharyngeal swab specimens and should not be used as a sole basis for treatment. Nasal washings and aspirates are unacceptable for Xpert Xpress SARS-CoV-2/FLU/RSV testing.  Fact  Sheet for Patients: EntrepreneurPulse.com.au  Fact Sheet for Healthcare Providers: IncredibleEmployment.be  This test is not yet approved or cleared by the [place] FDA and has been authorized for detection and/or diagnosis of SARS-CoV-2 by FDA under an Emergency Use Authorization (EUA). This EUA will remain in effect (meaning this test can be used) for the duration of the COVID-19 declaration under Section 564(b)(1) of the Act, 21 U.S.C. section 360bbb-3(b)(1), unless the authorization is terminated or revoked.  Performed at [facility], [place] [address], [place], [place] [address]   Culture, blood (routine x 2)     Status: None (Preliminary result)   Collection Time: 07/03/2020 11:00 PM    Specimen: BLOOD LEFT HAND  Result Value Ref Range Status   Specimen Description BLOOD LEFT HAND  Final   Special Requests AEROBIC BOTTLE ONLY Blood Culture adequate volume  Final   Culture   Final    NO GROWTH 4 DAYS Performed at [facility], [place] [address], [place], [place] [address]    Report Status PENDING  Incomplete  Culture, blood (routine x 2)     Status: None (Preliminary result)   Collection Time: 06/29/2020 11:00 PM   Specimen: BLOOD  Result Value Ref Range Status   Specimen Description BLOOD LEFT ANTECUBITAL  Final   Special Requests AEROBIC BOTTLE ONLY Blood Culture adequate volume  Final   Culture   Final    NO GROWTH 4 DAYS Performed at [facility], [place] [address], [place], [place] [address]    Report Status PENDING  Incomplete  Culture, blood (Routine X 2) w Reflex to ID Panel     Status: None (Preliminary result)   Collection Time: 2020/08/17  6:30 AM   Specimen: BLOOD  Result Value Ref Range Status   Specimen Description BLOOD  Final   Special Requests   Final    FROM RED CELL UNIT [id number] BOTTLES DRAWN AEROBIC AND ANAEROBIC Blood Culture adequate volume   Culture   Final    NO GROWTH 1 DAY Performed at [facility], [place] [address], [place],  [address]    Report Status PENDING  Incomplete  Gram stain     Status: None   Collection Time: 2020-08-17  6:33 AM   Specimen: BLOOD  Result Value Ref Range Status   Specimen Description BLOOD  Final   Special Requests RED CELL UNIT  Final   Gram Stain   Final    NO WBC SEEN NO ORGANISMS SEEN Performed at [facility], [address] [place],  [address]    Report Status 07/19/2020 FINAL  Final    Lab Basic Metabolic Panel: Recent Labs  Lab 07/06/2020 2307 07/16/20 0152 07/16/20 1647 07/17/20 1133 07/17/20 2149 07/17/20 2218  NA 133* 135 136 132* 146* 128*  K 5.3* 5.9* 2.9* 4.7 5.2* 5.5*  CL 90* 90* 94* 89* 83*  --   CO2 19* 18* 25 23 31   --   GLUCOSE 125* 79 98  110* 147*  --   BUN 90* 93* 25* 49* 48*  --   CREATININE 9.46* 9.33* 2.78* 5.12* 4.73*  --   CALCIUM 8.0* 8.1* 8.1* 8.2* 7.3*  --   MG  --  2.3  --   --   --   --   PHOS  --  8.8*  --  5.5*  --   --    Liver Function Tests: Recent Labs  Lab 06/22/2020 1050 07/12/2020  2307 07/16/20 1647 07/17/20 1133 07/17/20 1910 07/17/20 2149  AST 23 128* 207*  --  131* 92*  ALT 22 69* 137*  --  103* 73*  ALKPHOS 164* 115 103  --  117 92  BILITOT 0.8 0.8 0.8  --  1.0 0.7  PROT 6.9 5.8* 6.4*  --  6.3* 4.7*  ALBUMIN 2.7* 2.4* 2.6* 2.3* 2.6* 1.8*   Recent Labs  Lab 06/27/2020 1050  LIPASE 20   No results for input(s): AMMONIA in the last 168 hours. CBC: Recent Labs  Lab 06/21/2020 1050 06/24/2020 2307 07/16/20 0152 07/16/20 1647 07/17/20 1133 07/17/20 1910 07/17/20 2218  WBC 15.8* 18.3* 22.6* 18.0* 35.3* 36.9*  --   NEUTROABS 11.8*  --   --   --   --   --   --   HGB 8.7* 7.0* 7.0* 9.0* 8.1* 6.6* 6.5*  HCT 27.8* 20.3* 21.6* 25.1* 22.6* 18.3* 19.0*  MCV 103.0* 98.5 102.4* 89.6 91.9 93.8  --   PLT 413* 286 268 178 149* 132*  --    Cardiac Enzymes: No results for input(s): CKTOTAL, CKMB, CKMBINDEX, TROPONINI in the last 168 hours. Sepsis Labs: Recent Labs  Lab 07/16/20 0152 07/16/20 1647 07/16/20 2002 07/17/20 1133 07/17/20 1910 07/17/20 2215 August 05, 2020 0126  WBC 22.6* 18.0*  --  35.3* 36.9*  --   --   LATICACIDVEN  --  2.8* 4.6*  --   --  >11.0* >11.0*    Procedures/Operations  Endotracheal tube Central venous line Arterial line   Roselie Awkward 07/19/2020, 5:32 PM

## 2020-09-15 ENCOUNTER — Other Ambulatory Visit: Payer: Medicare Other
